# Patient Record
Sex: Male | Born: 1935
Health system: Southern US, Community
[De-identification: ages and names within clinical notes are randomized; demographics above are authoritative.]

## PROBLEM LIST (undated history)

## (undated) ENCOUNTER — Ambulatory Visit: Admission: EM | Payer: Medicare HMO

## (undated) DIAGNOSIS — G8929 Other chronic pain: Secondary | ICD-10-CM

## (undated) DIAGNOSIS — E785 Hyperlipidemia, unspecified: Secondary | ICD-10-CM

## (undated) DIAGNOSIS — I4892 Unspecified atrial flutter: Secondary | ICD-10-CM

## (undated) DIAGNOSIS — I214 Non-ST elevation (NSTEMI) myocardial infarction: Secondary | ICD-10-CM

## (undated) DIAGNOSIS — N2889 Other specified disorders of kidney and ureter: Secondary | ICD-10-CM

## (undated) DIAGNOSIS — I209 Angina pectoris, unspecified: Secondary | ICD-10-CM

## (undated) DIAGNOSIS — M1990 Unspecified osteoarthritis, unspecified site: Secondary | ICD-10-CM

## (undated) DIAGNOSIS — G4733 Obstructive sleep apnea (adult) (pediatric): Secondary | ICD-10-CM

## (undated) DIAGNOSIS — K219 Gastro-esophageal reflux disease without esophagitis: Secondary | ICD-10-CM

## (undated) DIAGNOSIS — M549 Dorsalgia, unspecified: Secondary | ICD-10-CM

## (undated) DIAGNOSIS — N189 Chronic kidney disease, unspecified: Secondary | ICD-10-CM

## (undated) DIAGNOSIS — I2510 Atherosclerotic heart disease of native coronary artery without angina pectoris: Secondary | ICD-10-CM

## (undated) DIAGNOSIS — Z85828 Personal history of other malignant neoplasm of skin: Secondary | ICD-10-CM

## (undated) DIAGNOSIS — N400 Enlarged prostate without lower urinary tract symptoms: Secondary | ICD-10-CM

## (undated) DIAGNOSIS — T7840XA Allergy, unspecified, initial encounter: Secondary | ICD-10-CM

## (undated) DIAGNOSIS — Z9989 Dependence on other enabling machines and devices: Secondary | ICD-10-CM

## (undated) DIAGNOSIS — I10 Essential (primary) hypertension: Secondary | ICD-10-CM

## (undated) DIAGNOSIS — M419 Scoliosis, unspecified: Secondary | ICD-10-CM

## (undated) DIAGNOSIS — M7580 Other shoulder lesions, unspecified shoulder: Secondary | ICD-10-CM

## (undated) HISTORY — DX: Obstructive sleep apnea (adult) (pediatric): G47.33

## (undated) HISTORY — DX: Benign prostatic hyperplasia without lower urinary tract symptoms: N40.0

## (undated) HISTORY — DX: Other chronic pain: G89.29

## (undated) HISTORY — DX: Allergy, unspecified, initial encounter: T78.40XA

## (undated) HISTORY — DX: Essential (primary) hypertension: I10

## (undated) HISTORY — DX: Other shoulder lesions, unspecified shoulder: M75.80

## (undated) HISTORY — DX: Scoliosis, unspecified: M41.9

## (undated) HISTORY — DX: Personal history of other malignant neoplasm of skin: Z85.828

## (undated) HISTORY — DX: Gastro-esophageal reflux disease without esophagitis: K21.9

## (undated) HISTORY — DX: Atherosclerotic heart disease of native coronary artery without angina pectoris: I25.10

## (undated) HISTORY — DX: Hyperlipidemia, unspecified: E78.5

## (undated) HISTORY — DX: Dependence on other enabling machines and devices: Z99.89

## (undated) HISTORY — DX: Dorsalgia, unspecified: M54.9

## (undated) HISTORY — DX: Unspecified osteoarthritis, unspecified site: M19.90

---

## 2001-01-27 ENCOUNTER — Ambulatory Visit (HOSPITAL_BASED_OUTPATIENT_CLINIC_OR_DEPARTMENT_OTHER): Admission: RE | Admit: 2001-01-27 | Discharge: 2001-01-27 | Payer: Self-pay | Admitting: Internal Medicine

## 2004-05-16 ENCOUNTER — Ambulatory Visit: Payer: Self-pay | Admitting: Family Medicine

## 2004-06-02 ENCOUNTER — Ambulatory Visit: Payer: Self-pay | Admitting: Family Medicine

## 2004-07-13 ENCOUNTER — Ambulatory Visit: Payer: Self-pay | Admitting: Family Medicine

## 2004-07-20 ENCOUNTER — Ambulatory Visit: Payer: Self-pay | Admitting: Family Medicine

## 2004-08-31 ENCOUNTER — Ambulatory Visit: Payer: Self-pay | Admitting: Family Medicine

## 2004-11-11 ENCOUNTER — Emergency Department: Payer: Self-pay | Admitting: Emergency Medicine

## 2004-12-06 ENCOUNTER — Ambulatory Visit: Payer: Self-pay | Admitting: Family Medicine

## 2005-01-04 ENCOUNTER — Ambulatory Visit: Payer: Self-pay | Admitting: Family Medicine

## 2005-03-15 ENCOUNTER — Ambulatory Visit: Payer: Self-pay | Admitting: Family Medicine

## 2005-04-02 ENCOUNTER — Ambulatory Visit: Payer: Self-pay | Admitting: Gastroenterology

## 2005-04-06 ENCOUNTER — Ambulatory Visit: Payer: Self-pay | Admitting: Family Medicine

## 2005-04-18 ENCOUNTER — Ambulatory Visit: Payer: Self-pay | Admitting: Gastroenterology

## 2005-04-18 ENCOUNTER — Encounter (INDEPENDENT_AMBULATORY_CARE_PROVIDER_SITE_OTHER): Payer: Self-pay | Admitting: *Deleted

## 2005-05-08 ENCOUNTER — Ambulatory Visit: Payer: Self-pay | Admitting: Family Medicine

## 2005-10-08 ENCOUNTER — Ambulatory Visit: Payer: Self-pay | Admitting: Family Medicine

## 2005-10-08 LAB — CONVERTED CEMR LAB: PSA: 1.35 ng/mL

## 2005-10-10 ENCOUNTER — Ambulatory Visit: Payer: Self-pay | Admitting: Family Medicine

## 2005-10-24 LAB — FECAL OCCULT BLOOD, GUAIAC: Fecal Occult Blood: NEGATIVE

## 2005-10-25 ENCOUNTER — Ambulatory Visit: Payer: Self-pay | Admitting: Family Medicine

## 2006-01-08 ENCOUNTER — Ambulatory Visit: Payer: Self-pay | Admitting: Family Medicine

## 2006-02-07 ENCOUNTER — Ambulatory Visit: Payer: Self-pay | Admitting: Family Medicine

## 2006-03-13 ENCOUNTER — Ambulatory Visit: Payer: Self-pay | Admitting: Family Medicine

## 2006-04-09 ENCOUNTER — Ambulatory Visit: Payer: Self-pay | Admitting: Family Medicine

## 2006-05-07 ENCOUNTER — Ambulatory Visit: Payer: Self-pay | Admitting: Family Medicine

## 2006-08-21 ENCOUNTER — Ambulatory Visit: Payer: Self-pay | Admitting: Family Medicine

## 2006-08-21 LAB — CONVERTED CEMR LAB
ALT: 29 units/L (ref 0–40)
AST: 22 units/L (ref 0–37)
Cholesterol: 196 mg/dL (ref 0–200)
Creatinine,U: 162.6 mg/dL
HDL: 36.3 mg/dL — ABNORMAL LOW (ref >39.0)
Hgb A1c MFr Bld: 6.2 %
Total CHOL/HDL Ratio: 5.4
Triglycerides: 103 mg/dL (ref 0–149)

## 2006-10-04 ENCOUNTER — Encounter: Payer: Self-pay | Admitting: Family Medicine

## 2006-10-04 DIAGNOSIS — F528 Other sexual dysfunction not due to a substance or known physiological condition: Secondary | ICD-10-CM

## 2006-10-04 DIAGNOSIS — R609 Edema, unspecified: Secondary | ICD-10-CM

## 2006-10-04 DIAGNOSIS — IMO0001 Reserved for inherently not codable concepts without codable children: Secondary | ICD-10-CM | POA: Insufficient documentation

## 2006-10-04 DIAGNOSIS — G4730 Sleep apnea, unspecified: Secondary | ICD-10-CM | POA: Insufficient documentation

## 2006-10-04 DIAGNOSIS — Z794 Long term (current) use of insulin: Secondary | ICD-10-CM

## 2006-10-04 DIAGNOSIS — E119 Type 2 diabetes mellitus without complications: Secondary | ICD-10-CM

## 2006-10-04 DIAGNOSIS — Z6835 Body mass index (BMI) 35.0-35.9, adult: Secondary | ICD-10-CM

## 2006-10-04 DIAGNOSIS — M1990 Unspecified osteoarthritis, unspecified site: Secondary | ICD-10-CM | POA: Insufficient documentation

## 2006-10-04 DIAGNOSIS — Z85828 Personal history of other malignant neoplasm of skin: Secondary | ICD-10-CM

## 2006-10-04 DIAGNOSIS — I10 Essential (primary) hypertension: Secondary | ICD-10-CM | POA: Insufficient documentation

## 2006-10-04 DIAGNOSIS — K219 Gastro-esophageal reflux disease without esophagitis: Secondary | ICD-10-CM | POA: Insufficient documentation

## 2006-10-04 DIAGNOSIS — E66812 Obesity, class 2: Secondary | ICD-10-CM | POA: Insufficient documentation

## 2006-10-04 DIAGNOSIS — J309 Allergic rhinitis, unspecified: Secondary | ICD-10-CM

## 2006-11-26 ENCOUNTER — Ambulatory Visit: Payer: Self-pay | Admitting: Family Medicine

## 2006-11-29 LAB — CONVERTED CEMR LAB
Albumin: 3.9 g/dL (ref 3.5–5.2)
GFR calc Af Amer: 85 mL/min
GFR calc non Af Amer: 70 mL/min
Glucose, Bld: 141 mg/dL — ABNORMAL HIGH (ref 70–99)
Phosphorus: 3 mg/dL (ref 2.3–4.6)
Potassium: 4.4 meq/L (ref 3.5–5.1)
Sodium: 145 meq/L (ref 135–145)

## 2007-04-15 ENCOUNTER — Encounter: Payer: Self-pay | Admitting: Family Medicine

## 2007-05-22 ENCOUNTER — Ambulatory Visit: Payer: Self-pay | Admitting: Family Medicine

## 2007-05-23 LAB — CONVERTED CEMR LAB
ALT: 32 units/L (ref 0–53)
AST: 26 units/L (ref 0–37)
Albumin: 3.7 g/dL (ref 3.5–5.2)
Basophils Absolute: 0 10*3/uL (ref 0.0–0.1)
Basophils Relative: 0.3 % (ref 0.0–1.0)
Calcium: 9.2 mg/dL (ref 8.4–10.5)
Chloride: 106 meq/L (ref 96–112)
Creatinine, Ser: 1.1 mg/dL (ref 0.4–1.5)
GFR calc Af Amer: 85 mL/min
GFR calc non Af Amer: 70 mL/min
HCT: 43.2 % (ref 39.0–52.0)
Hemoglobin: 15.2 g/dL (ref 13.0–17.0)
LDL Cholesterol: 137 mg/dL — ABNORMAL HIGH (ref 0–99)
Monocytes Absolute: 0.7 10*3/uL (ref 0.2–0.7)
Neutrophils Relative %: 59.2 % (ref 43.0–77.0)
RBC: 4.62 M/uL (ref 4.22–5.81)
RDW: 13.9 % (ref 11.5–14.6)
Sodium: 141 meq/L (ref 135–145)
TSH: 2.25 microintl units/mL (ref 0.35–5.50)
Total CHOL/HDL Ratio: 5.7
VLDL: 17 mg/dL (ref 0–40)

## 2007-07-03 HISTORY — PX: BREAST SURGERY: SHX581

## 2007-11-21 ENCOUNTER — Ambulatory Visit: Payer: Self-pay | Admitting: Family Medicine

## 2007-11-26 LAB — CONVERTED CEMR LAB
Albumin: 3.8 g/dL (ref 3.5–5.2)
Alkaline Phosphatase: 67 units/L (ref 39–117)
Basophils Absolute: 0 10*3/uL (ref 0.0–0.1)
Basophils Relative: 0.8 % (ref 0.0–1.0)
CO2: 31 meq/L (ref 19–32)
Chloride: 108 meq/L (ref 96–112)
Creatinine, Ser: 1.1 mg/dL (ref 0.4–1.5)
Creatinine,U: 172.4 mg/dL
Eosinophils Absolute: 0.4 10*3/uL (ref 0.0–0.7)
Eosinophils Relative: 6.6 % — ABNORMAL HIGH (ref 0.0–5.0)
GFR calc Af Amer: 85 mL/min
GFR calc non Af Amer: 70 mL/min
HCT: 44.3 % (ref 39.0–52.0)
HDL: 28.7 mg/dL — ABNORMAL LOW (ref >39.0)
Hemoglobin: 15 g/dL (ref 13.0–17.0)
Hgb A1c MFr Bld: 6.8 % — ABNORMAL HIGH (ref 4.6–6.0)
MCHC: 33.8 g/dL (ref 30.0–36.0)
MCV: 93.1 fL (ref 78.0–100.0)
Microalb Creat Ratio: 10.4 mg/g (ref 0.0–30.0)
Monocytes Absolute: 0.6 10*3/uL (ref 0.1–1.0)
Neutro Abs: 3.3 10*3/uL (ref 1.4–7.7)
Potassium: 4.3 meq/L (ref 3.5–5.1)
RBC: 4.76 M/uL (ref 4.22–5.81)
Sodium: 139 meq/L (ref 135–145)
Total Protein: 6.7 g/dL (ref 6.0–8.3)
WBC: 5.9 10*3/uL (ref 4.5–10.5)

## 2007-12-04 ENCOUNTER — Ambulatory Visit: Payer: Self-pay | Admitting: Family Medicine

## 2008-02-26 ENCOUNTER — Ambulatory Visit: Payer: Self-pay | Admitting: Family Medicine

## 2008-02-27 LAB — CONVERTED CEMR LAB
ALT: 32 units/L (ref 0–53)
Albumin: 3.8 g/dL (ref 3.5–5.2)
Chloride: 109 meq/L (ref 96–112)
Cholesterol: 228 mg/dL (ref 0–200)
Creatinine, Ser: 1.1 mg/dL (ref 0.4–1.5)
Direct LDL: 147.1 mg/dL
Hgb A1c MFr Bld: 6.5 % — ABNORMAL HIGH (ref 4.6–6.0)
Sodium: 144 meq/L (ref 135–145)
Total CHOL/HDL Ratio: 6.9
VLDL: 25 mg/dL (ref 0–40)

## 2008-03-10 ENCOUNTER — Ambulatory Visit: Payer: Self-pay | Admitting: Family Medicine

## 2008-03-15 ENCOUNTER — Encounter: Payer: Self-pay | Admitting: Family Medicine

## 2008-09-08 ENCOUNTER — Ambulatory Visit: Payer: Self-pay | Admitting: Family Medicine

## 2008-09-09 LAB — CONVERTED CEMR LAB
ALT: 33 units/L (ref 0–53)
AST: 24 units/L (ref 0–37)
Cholesterol: 184 mg/dL (ref 0–200)
VLDL: 15 mg/dL (ref 0–40)

## 2008-09-16 ENCOUNTER — Ambulatory Visit: Payer: Self-pay | Admitting: Family Medicine

## 2008-09-28 ENCOUNTER — Ambulatory Visit: Payer: Self-pay | Admitting: Family Medicine

## 2008-09-29 ENCOUNTER — Encounter: Payer: Self-pay | Admitting: Family Medicine

## 2008-09-30 ENCOUNTER — Ambulatory Visit: Payer: Self-pay | Admitting: Family Medicine

## 2008-10-15 ENCOUNTER — Telehealth: Payer: Self-pay | Admitting: Family Medicine

## 2008-10-30 LAB — HM DIABETES EYE EXAM: HM Diabetic Eye Exam: NORMAL

## 2008-11-01 ENCOUNTER — Ambulatory Visit: Payer: Self-pay | Admitting: Family Medicine

## 2008-11-09 ENCOUNTER — Telehealth: Payer: Self-pay | Admitting: Family Medicine

## 2008-11-16 ENCOUNTER — Encounter: Payer: Self-pay | Admitting: Family Medicine

## 2008-11-29 ENCOUNTER — Encounter: Payer: Self-pay | Admitting: Family Medicine

## 2008-11-30 ENCOUNTER — Ambulatory Visit: Payer: Self-pay | Admitting: Family Medicine

## 2009-01-18 ENCOUNTER — Telehealth: Payer: Self-pay | Admitting: Family Medicine

## 2009-02-03 ENCOUNTER — Ambulatory Visit: Payer: Self-pay | Admitting: Family Medicine

## 2009-02-04 ENCOUNTER — Telehealth: Payer: Self-pay | Admitting: Family Medicine

## 2009-02-04 ENCOUNTER — Ambulatory Visit: Payer: Self-pay | Admitting: Family Medicine

## 2009-02-06 LAB — CONVERTED CEMR LAB
Glucose, Bld: 148 mg/dL — ABNORMAL HIGH (ref 70–99)
Hgb A1c MFr Bld: 6.1 % (ref 4.6–6.5)

## 2009-02-07 ENCOUNTER — Encounter: Payer: Self-pay | Admitting: Family Medicine

## 2009-02-09 ENCOUNTER — Encounter: Payer: Self-pay | Admitting: Family Medicine

## 2009-05-17 ENCOUNTER — Encounter: Admission: RE | Admit: 2009-05-17 | Discharge: 2009-05-17 | Payer: Self-pay | Admitting: Family Medicine

## 2009-05-17 ENCOUNTER — Ambulatory Visit: Payer: Self-pay | Admitting: Family Medicine

## 2009-05-17 IMAGING — CR DG LUMBAR SPINE COMPLETE 4+V
5 series · 5 of 5 positions shown · non-contrast
Comparison: None

CLINICAL DATA: Low back pain for 5 weeks.  No known injury.

LUMBAR SPINE - COMPLETE 4+ VIEW

[view not recorded (1 of 5)]
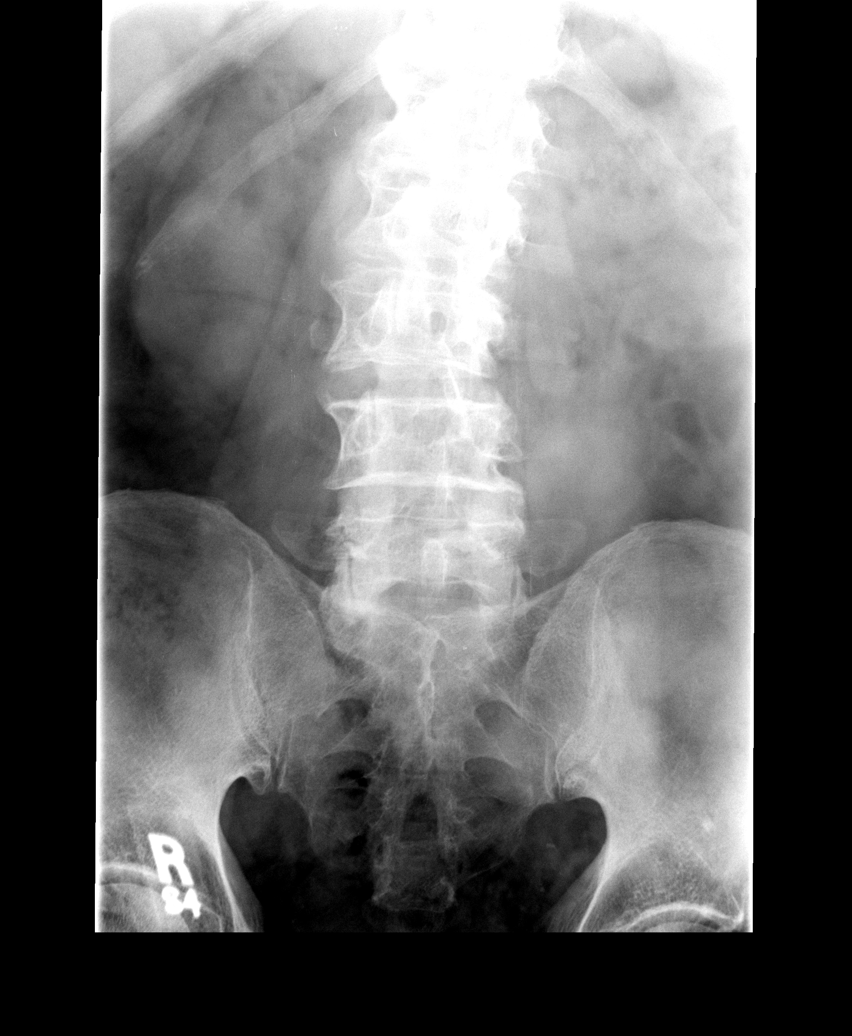

[view not recorded (2 of 5)]
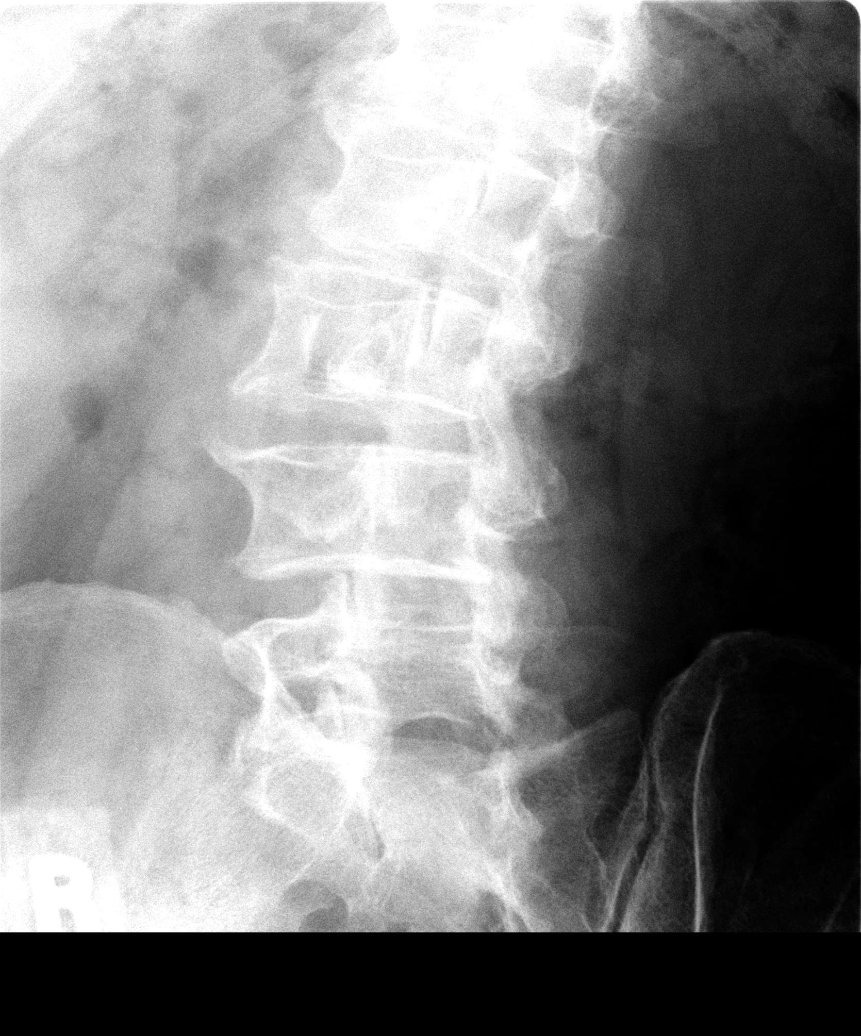

[view not recorded (3 of 5)]
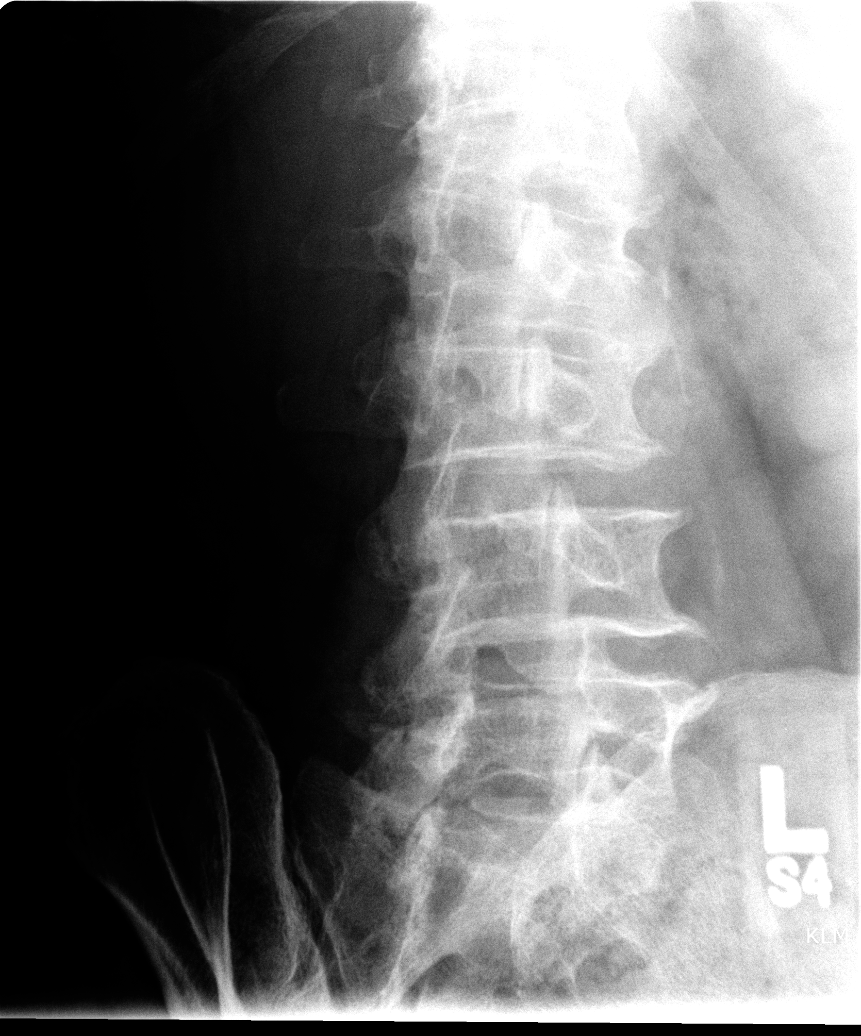

[view not recorded (4 of 5)]
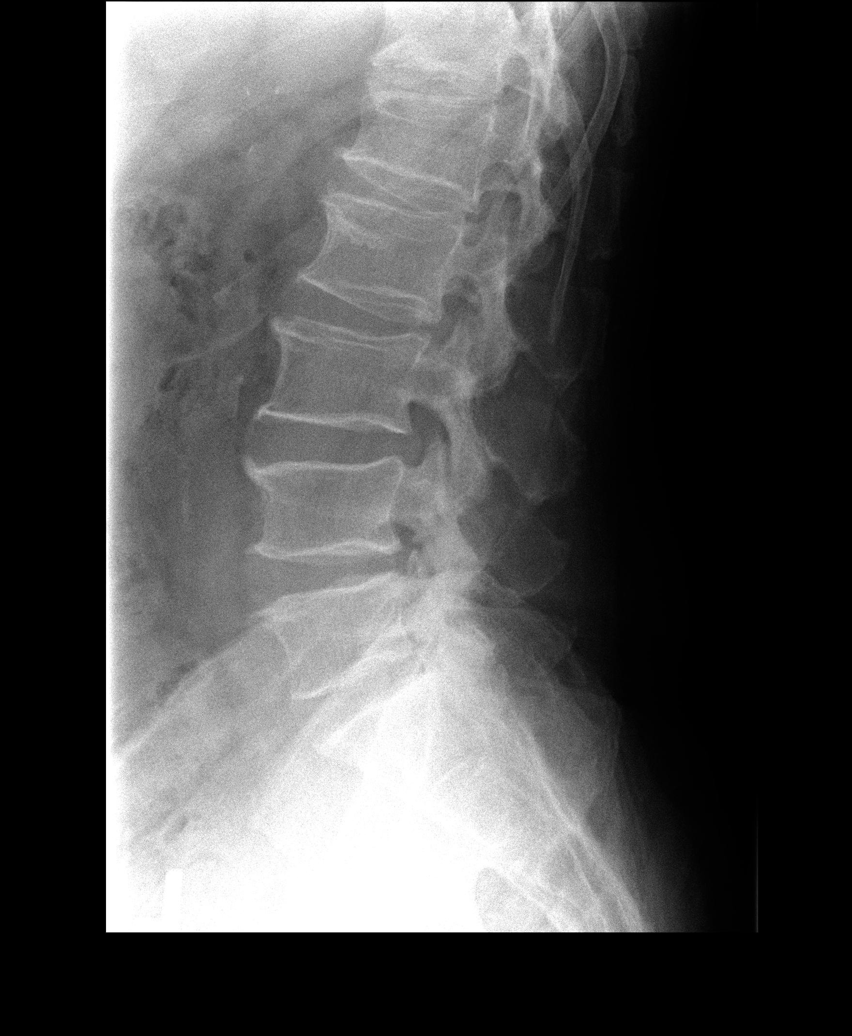

[view not recorded (5 of 5)]
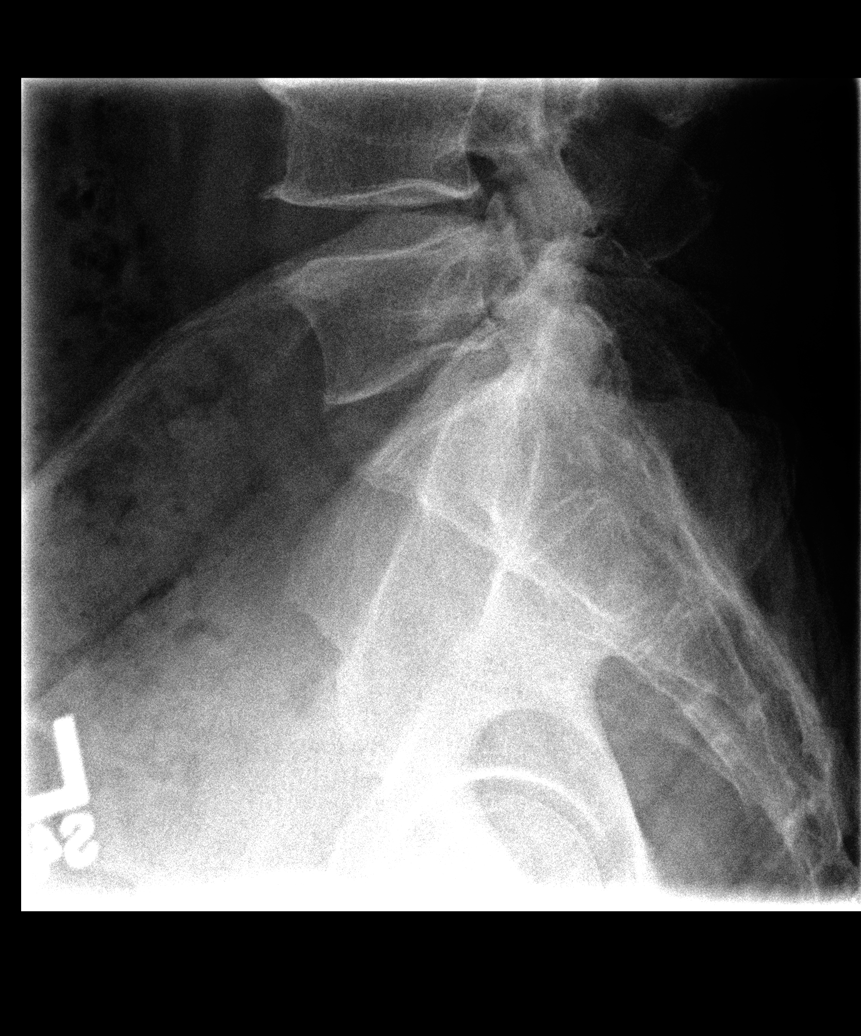

[5 of 5 positions shown; findings below may reference images not displayed]

FINDINGS: There are five lumbar type vertebral bodies.  There is a
mild dextroscoliosis centered at L2-L3.  The lateral alignment is
normal.  Mild anterior wedging of the L1 vertebral body does not
appear acute.  There is no evidence of acute fracture.  Mild facet
degenerative changes are present inferiorly.  There are small
paraspinal osteophytes and mild bilateral facet hypertrophy.
IMPRESSION: Mild spondylosis and convex right scoliosis.  No acute osseous
findings.

## 2009-05-31 ENCOUNTER — Encounter: Payer: Self-pay | Admitting: Family Medicine

## 2009-07-05 ENCOUNTER — Encounter: Payer: Self-pay | Admitting: Family Medicine

## 2009-08-08 ENCOUNTER — Encounter (INDEPENDENT_AMBULATORY_CARE_PROVIDER_SITE_OTHER): Payer: Self-pay | Admitting: *Deleted

## 2009-08-09 ENCOUNTER — Ambulatory Visit: Payer: Self-pay | Admitting: Family Medicine

## 2009-08-09 LAB — CONVERTED CEMR LAB
AST: 21 units/L (ref 0–37)
Chloride: 106 meq/L (ref 96–112)
Cholesterol: 202 mg/dL — ABNORMAL HIGH (ref 0–200)
GFR calc non Af Amer: 69.65 mL/min (ref >60)
Glucose, Bld: 170 mg/dL — ABNORMAL HIGH (ref 70–99)
Hgb A1c MFr Bld: 6.8 % — ABNORMAL HIGH (ref 4.6–6.5)
PSA: 2.13 ng/mL (ref 0.10–4.00)
Phosphorus: 3.8 mg/dL (ref 2.3–4.6)
Potassium: 4.3 meq/L (ref 3.5–5.1)
Sodium: 144 meq/L (ref 135–145)

## 2009-08-10 ENCOUNTER — Encounter: Payer: Self-pay | Admitting: Critical Care Medicine

## 2009-08-11 ENCOUNTER — Ambulatory Visit: Payer: Self-pay | Admitting: Family Medicine

## 2009-10-07 ENCOUNTER — Telehealth: Payer: Self-pay | Admitting: Family Medicine

## 2009-11-04 ENCOUNTER — Ambulatory Visit: Payer: Self-pay | Admitting: Family Medicine

## 2009-11-07 LAB — CONVERTED CEMR LAB
AST: 21 units/L (ref 0–37)
Albumin: 4 g/dL (ref 3.5–5.2)
BUN: 18 mg/dL (ref 6–23)
Cholesterol: 200 mg/dL (ref 0–200)
Creatinine, Ser: 1 mg/dL (ref 0.4–1.5)
Hgb A1c MFr Bld: 6.6 % — ABNORMAL HIGH (ref 4.6–6.5)
LDL Cholesterol: 140 mg/dL — ABNORMAL HIGH (ref 0–99)
Potassium: 4.1 meq/L (ref 3.5–5.1)
Sodium: 139 meq/L (ref 135–145)
Total CHOL/HDL Ratio: 6
Triglycerides: 125 mg/dL (ref 0.0–149.0)

## 2009-11-08 ENCOUNTER — Ambulatory Visit: Payer: Self-pay | Admitting: Family Medicine

## 2009-12-01 ENCOUNTER — Telehealth: Payer: Self-pay | Admitting: Family Medicine

## 2010-01-13 ENCOUNTER — Ambulatory Visit: Payer: Self-pay | Admitting: Family Medicine

## 2010-01-13 DIAGNOSIS — M545 Low back pain: Secondary | ICD-10-CM

## 2010-02-17 ENCOUNTER — Telehealth: Payer: Self-pay | Admitting: Family Medicine

## 2010-02-24 ENCOUNTER — Telehealth: Payer: Self-pay | Admitting: Family Medicine

## 2010-02-25 ENCOUNTER — Encounter: Payer: Self-pay | Admitting: Family Medicine

## 2010-02-27 ENCOUNTER — Encounter: Payer: Self-pay | Admitting: Family Medicine

## 2010-05-09 ENCOUNTER — Ambulatory Visit: Payer: Self-pay | Admitting: Family Medicine

## 2010-05-10 LAB — CONVERTED CEMR LAB
AST: 17 units/L (ref 0–37)
Basophils Relative: 0.4 % (ref 0.0–3.0)
CO2: 28 meq/L (ref 19–32)
Calcium: 9.3 mg/dL (ref 8.4–10.5)
Chloride: 106 meq/L (ref 96–112)
Cholesterol: 211 mg/dL — ABNORMAL HIGH (ref 0–200)
Creatinine, Ser: 1.1 mg/dL (ref 0.4–1.5)
Creatinine,U: 162.9 mg/dL
Direct LDL: 156.5 mg/dL
Eosinophils Relative: 3.7 % (ref 0.0–5.0)
GFR calc non Af Amer: 68.79 mL/min (ref >60)
HCT: 42.3 % (ref 39.0–52.0)
HDL: 36.4 mg/dL — ABNORMAL LOW (ref >39.00)
Hemoglobin: 14.3 g/dL (ref 13.0–17.0)
Lymphs Abs: 1.7 10*3/uL (ref 0.7–4.0)
MCV: 94.8 fL (ref 78.0–100.0)
Microalb Creat Ratio: 2 mg/g (ref 0.0–30.0)
Monocytes Absolute: 0.8 10*3/uL (ref 0.1–1.0)
Monocytes Relative: 11.4 % (ref 3.0–12.0)
Neutro Abs: 4.1 10*3/uL (ref 1.4–7.7)
Sodium: 142 meq/L (ref 135–145)
VLDL: 32.6 mg/dL (ref 0.0–40.0)
WBC: 6.9 10*3/uL (ref 4.5–10.5)

## 2010-05-15 ENCOUNTER — Ambulatory Visit: Payer: Self-pay | Admitting: Family Medicine

## 2010-05-15 DIAGNOSIS — R32 Unspecified urinary incontinence: Secondary | ICD-10-CM | POA: Insufficient documentation

## 2010-05-30 ENCOUNTER — Encounter: Payer: Self-pay | Admitting: Gastroenterology

## 2010-05-31 ENCOUNTER — Encounter: Payer: Self-pay | Admitting: Family Medicine

## 2010-06-23 ENCOUNTER — Ambulatory Visit: Payer: Self-pay | Admitting: Family Medicine

## 2010-07-05 ENCOUNTER — Encounter (INDEPENDENT_AMBULATORY_CARE_PROVIDER_SITE_OTHER): Payer: Self-pay | Admitting: *Deleted

## 2010-07-12 ENCOUNTER — Encounter: Payer: Self-pay | Admitting: Family Medicine

## 2010-07-18 ENCOUNTER — Encounter (INDEPENDENT_AMBULATORY_CARE_PROVIDER_SITE_OTHER): Payer: Self-pay | Admitting: *Deleted

## 2010-07-20 ENCOUNTER — Ambulatory Visit
Admission: RE | Admit: 2010-07-20 | Discharge: 2010-07-20 | Payer: Self-pay | Source: Home / Self Care | Attending: Gastroenterology | Admitting: Gastroenterology

## 2010-08-03 NOTE — Letter (Signed)
Summary: Martin No Show Letter  [place] [facility]  [address] [place], [place] [address]   Phone: [phone]  Fax: [phone]    08/08/2009 MRN: [id number]  ELIESER TETRICK [address] [place], [place]  [address]   Dear Mr. KAPUR,   Our records indicate that you missed your scheduled appointment with ___lab__________________ on __2.7.11__________.  Please contact this office to reschedule your appointment as soon as possible.  It is important that you keep your scheduled appointments with your physician, so we can provide you the best care possible.  Please be advised that there may be a charge for "no show" appointments.    Sincerely,   [place] at [facility]

## 2010-08-03 NOTE — Miscellaneous (Signed)
Summary: PT Discharge/[facility]  PT Discharge/[facility]   Imported By: Lanelle Bal 07/06/2009 09:53:34  _____________________________________________________________________  External Attachment:    Type:   Image     Comment:   External Document

## 2010-08-03 NOTE — Letter (Signed)
Summary: [facility] Instructions  [facility]  [address] [place], [place] [address]   Phone: [phone]  Fax: [phone]       DAYMIEN GOTH    1933-12-24    MRN: [id number]        Procedure Day /Date:  Tuesday 08/08/2010     Arrival Time: 7:30 am     Procedure Time: 8:30 am     Location of Procedure:                    _x _  [facility] (4th Floor)                        PREPARATION FOR COLONOSCOPY WITH MOVIPREP   Starting 5 days prior to your procedure Thursday 2/2 do not eat nuts, seeds, popcorn, corn, beans, peas,  salads, or any raw vegetables.  Do not take any fiber supplements (e.g. Metamucil, Citrucel, and Benefiber).  THE DAY BEFORE YOUR PROCEDURE         DATE: Monday 2/6  1.  Drink clear liquids the entire day-NO SOLID FOOD  2.  Do not drink anything colored red or purple.  Avoid juices with pulp.  No orange juice.  3.  Drink at least 64 oz. (8 glasses) of fluid/clear liquids during the day to prevent dehydration and help the prep work efficiently.  CLEAR LIQUIDS INCLUDE: Water Jello Ice Popsicles Tea (sugar ok, no milk/cream) Powdered fruit flavored drinks Coffee (sugar ok, no milk/cream) Gatorade Juice: apple, white grape, white cranberry  Lemonade Clear bullion, consomm, broth Carbonated beverages (any kind) Strained chicken noodle soup Hard Candy                             4.  In the morning, mix first dose of MoviPrep solution:    Empty 1 Pouch A and 1 Pouch B into the disposable container    Add lukewarm drinking water to the top line of the container. Mix to dissolve    Refrigerate (mixed solution should be used within 24 hrs)  5.  Begin drinking the prep at 5:00 p.m. The MoviPrep container is divided by 4 marks.   Every 15 minutes drink the solution down to the next mark (approximately 8 oz) until the full liter is complete.   6.  Follow completed prep with 16 oz of clear liquid of your choice (Nothing red or  purple).  Continue to drink clear liquids until bedtime.  7.  Before going to bed, mix second dose of MoviPrep solution:    Empty 1 Pouch A and 1 Pouch B into the disposable container    Add lukewarm drinking water to the top line of the container. Mix to dissolve    Refrigerate  THE DAY OF YOUR PROCEDURE      DATE: Tuesday 2/7  Beginning at 3:30 a.m. (5 hours before procedure):         1. Every 15 minutes, drink the solution down to the next mark (approx 8 oz) until the full liter is complete.  2. Follow completed prep with 16 oz. of clear liquid of your choice.    3. You may drink clear liquids until 6:30 am (2 HOURS BEFORE PROCEDURE).   MEDICATION INSTRUCTIONS  Unless otherwise instructed, you should take regular prescription medications with a small sip of water   as early as possible the morning of your  procedure.  Diabetic patients - see separate instructions.  Additional medication instructions: DO NOT TAKE HYDROCHLOROTHIAZIDE ON MORNING OF PROCEDURE.         OTHER INSTRUCTIONS  You will need a responsible adult at least 75 years of age to accompany you and drive you home.   This person must remain in the waiting room during your procedure.  Wear loose fitting clothing that is easily removed.  Leave jewelry and other valuables at home.  However, you may wish to bring a book to read or  an iPod/MP3 player to listen to music as you wait for your procedure to start.  Remove all body piercing jewelry and leave at home.  Total time from sign-in until discharge is approximately 2-3 hours.  You should go home directly after your procedure and rest.  You can resume normal activities the  day after your procedure.  The day of your procedure you should not:   Drive   Make legal decisions   Operate machinery   Drink alcohol   Return to work  You will receive specific instructions about eating, activities and medications before you leave.    The above  instructions have been reviewed and explained to me by   Durwin Glaze RN  July 20, 2010 10:35 AM     I fully understand and can verbalize these instructions _____________________________ Date _________

## 2010-08-03 NOTE — Assessment & Plan Note (Signed)
Summary: ROA 3 MTHS CYD   Vital Signs:  Patient profile:   77 year old male Height:      71 inches Weight:      257.50 pounds BMI:     36.04 Temp:     98.1 degrees F oral Pulse rate:   60 / minute Pulse rhythm:   regular BP sitting:   138 / 66  (left arm) Cuff size:   large  Vitals Entered By: Lewanda Rife LPN (Nov 08, 2009 9:18 AM)  Serial Vital Signs/Assessments:  Time      Position  BP       Pulse  Resp  Temp     By                     128/69                         Judith Part MD  CC: follow up after labs   CC:  follow up after labs.  History of Present Illness: here for f/u of DM and HTN and obesity and lipids is doing fine- feeling ok   wt is down 2 lb with bmi of 36 is staying busy outdoors in warmer weather   DM is a little better -- AIC is down from 6.8 to 6.6 this time with fasting sugar of 161 home sugar  (they got high several weeks ago for no reason) - then they came back down changed his eating a bit and got more exercise  no stress or infection  got out his elliptical machine  130-150s in am  in am and 160s after a meal  opthy -- overdue for that -and wants to schedule  his vision is fine     lipids are slt imp with trig 125/ HDL 35 and LDL 140 (down from 152) he refuses med of any kind after intol of zocor   bp is better with ace today 138/66 - first check   Allergies: 1)  ! Zocor 2)  Claritin  Past History:  Past Medical History: Last updated: 10/04/2006 Allergic rhinitis Skin cancer, hx of- basal cell Diabetes mellitus, type II GERD Hypertension Osteoarthritis  Past Surgical History: Last updated: 10/04/2006 Left ankle spurs (04/1998) Breast lump removed- benign (1997-1998) CAD- Cath, mild ? (1995) Colonoscopy- polyps, diverticulosis (04/2002)  Social History: Last updated: 11/21/2007 works at home non smoker  Risk Factors: Smoking Status: never (10/04/2006)  Review of Systems General:  Denies fatigue, fever, loss  of appetite, and malaise. Eyes:  Denies blurring and eye irritation. CV:  Denies chest pain or discomfort and palpitations. Resp:  Denies cough, shortness of breath, sputum productive, and wheezing. GI:  Denies abdominal pain, indigestion, and nausea. GU:  Denies urinary frequency. MS:  Complains of joint pain and stiffness; denies muscle aches and cramps. Derm:  Denies itching, lesion(s), poor wound healing, and rash. Neuro:  Denies numbness and tingling. Psych:  Denies anxiety and depression. Endo:  Denies excessive thirst and excessive urination. Heme:  Denies abnormal bruising and bleeding.  Physical Exam  General:  Well-developed,well-nourished,in no acute distress; alert,appropriate and cooperative throughout examination Head:  normocephalic, atraumatic, and no abnormalities observed.  no sinus tenderness  Eyes:  vision grossly intact, pupils equal, pupils round, and pupils reactive to light.   Ears:  R ear normal and L ear normal.   Nose:  nares boggy and pale Mouth:  pharynx pink and moist.  Neck:  nl rom with no bony tenderness  Lungs:  Normal respiratory effort, chest expands symmetrically. Lungs are clear to auscultation, no crackles or wheezes. Heart:  Normal rate and regular rhythm. S1 and S2 normal without gallop, murmur, click, rub or other extra sounds. Abdomen:  soft and non-tender.  no renal bruits    Msk:  poor rom knees improved rom LS  no acute joint changes  Pulses:  R and L carotid,radial,femoral,dorsalis pedis and posterior tibial pulses are full and equal bilaterally Extremities:  No clubbing, cyanosis, edema, or deformity noted with normal full range of motion of all joints.   Neurologic:  strength normal in all extremities, sensation intact to light touch, gait normal, and DTRs symmetrical and normal.   Skin:  Intact without suspicious lesions or rashes Cervical Nodes:  No lymphadenopathy noted Psych:  normal affect, talkative and pleasant   Diabetes  Management Exam:    Foot Exam (with socks and/or shoes not present):       Sensory-Pinprick/Light touch:          Left medial foot (L-4): normal          Left dorsal foot (L-5): normal          Left lateral foot (S-1): normal          Right medial foot (L-4): normal          Right dorsal foot (L-5): normal          Right lateral foot (S-1): normal       Sensory-Monofilament:          Left foot: normal          Right foot: normal       Inspection:          Left foot: normal          Right foot: normal       Nails:          Left foot: normal          Right foot: normal   Impression & Recommendations:  Problem # 1:  HYPERCHOLESTEROLEMIA (ICD-272.0) Assessment Improved  cholesterol is improved a bit with lifestyle change still not at goal pt ref any med for this and understands risks  disc low sat fat diet  re check 6 mo and f/u  Labs Reviewed: SGOT: 21 (11/04/2009)   SGPT: 26 (11/04/2009)   HDL:35.50 (11/04/2009), 39.00 (08/09/2009)  LDL:140 (11/04/2009), 136 (09/08/2008)  Chol:200 (11/04/2009), 202 (08/09/2009)  Trig:125.0 (11/04/2009), 121.0 (08/09/2009)  Problem # 2:  HYPERTENSION (ICD-401.9) Assessment: Improved  bp better with ace- doing well enc exercise lab and f/u 6 mo  rev lab today in detail  His updated medication list for this problem includes:    Hydrochlorothiazide 25 Mg Tabs (Hydrochlorothiazide) ..... One by mouth once daily    Lisinopril 5 Mg Tabs (Lisinopril) .Marland Kitchen... 1 by mouth once daily  BP today: 138/66-- re check 128/69 Prior BP: 145/60 (08/11/2009)  Labs Reviewed: K+: 4.1 (11/04/2009) Creat: : 1.0 (11/04/2009)   Chol: 200 (11/04/2009)   HDL: 35.50 (11/04/2009)   LDL: 140 (11/04/2009)   TG: 125.0 (11/04/2009)  Problem # 3:  DIABETES MELLITUS, TYPE II (ICD-250.00) Assessment: Improved  sugar control is a bit better - with addn of regular exercise disc healthy diet (low simple sugar/ choose complex carbs/ low sat fat) diet and exercise in detail   no change in med lab and f/u 6 mo  lab rev in detail today His  updated medication list for this problem includes:    Metformin Hcl 1000 Mg Tabs (Metformin hcl) ..... One by mouth two times a day    Adult Aspirin Low Strength 81 Mg Tbdp (Aspirin) ..... One by mouth daily    Glipizide Xl 5 Mg Xr24h-tab (Glipizide) .Marland Kitchen... 1 by mouth once daily    Lisinopril 5 Mg Tabs (Lisinopril) .Marland Kitchen... 1 by mouth once daily  Labs Reviewed: Creat: 1.0 (11/04/2009)     Last Eye Exam: normal (10/30/2008) Reviewed HgBA1c results: 6.6 (11/04/2009)  6.8 (08/09/2009)  Problem # 4:  ALLERGIC RHINITIS (ICD-477.9) Assessment: Deteriorated  ins no longer pays for beconase will try flonase again claritin no longer works- trial of zyrtec  The following medications were removed from the medication list:    Beconase Aq 42 Mcg/spray Susp (Beclomethasone diprop monohyd) .Marland Kitchen... 1 spray in each nostril once daily    Claritin 10 Mg Tabs (Loratadine) .Marland Kitchen... 1 by mouth once daily His updated medication list for this problem includes:    Flonase 50 Mcg/act Susp (Fluticasone propionate) .Marland Kitchen... 2 sprays in each nostril once daily    Zyrtec Allergy 10 Mg Tabs (Cetirizine hcl) .Marland Kitchen... 1 by mouth once daily  Orders: Prescription Created Electronically ([id number])  Complete Medication List: 1)  Hydrochlorothiazide 25 Mg Tabs (Hydrochlorothiazide) .... One by mouth once daily 2)  Metformin Hcl 1000 Mg Tabs (Metformin hcl) .... One by mouth two times a day 3)  Adult Aspirin Low Strength 81 Mg Tbdp (Aspirin) .... One by mouth daily 4)  Freestyle Lite Test Strp (Glucose blood) .... To check glucose twice  daily and as needed  250.0 5)  Glucometer  .... To check glucose once daily and as needed  250.0 6)  Glipizide Xl 5 Mg Xr24h-tab (Glipizide) .Marland Kitchen.. 1 by mouth once daily 7)  Freestyle Lancets Misc (Lancets) .... To check glucose twice  daily and as needed  250.0 8)  Levitra 20 Mg Tabs (Vardenafil hcl) .Marland Kitchen.. 1 by mouth as directed 30 min  before sexual activity 9)  Flax Oil (Flaxseed (linseed)) .... Take one daily 10)  Fish Oil Oil (Fish oil) .... Take two daily 11)  Lisinopril 5 Mg Tabs (Lisinopril) .Marland Kitchen.. 1 by mouth once daily 12)  Flonase 50 Mcg/act Susp (Fluticasone propionate) .... 2 sprays in each nostril once daily 13)  Zyrtec Allergy 10 Mg Tabs (Cetirizine hcl) .Marland Kitchen.. 1 by mouth once daily  Patient Instructions: 1)  try flonase again- I will send to pharmacy  2)  I will also px zyrtec  3)  keep working on healthy diet and exercise and weight loss  4)  schedule fasting labs in 6 months lipid/ast/alt/AIC Judeth Porch / microalb/ cbc with diff/ tsh 401.1, 250.0, 272  5)  then follow up  6)  make sure to make your eye doctor appt - this is a yearly requirement for diabetics  Prescriptions: ZYRTEC ALLERGY 10 MG TABS (CETIRIZINE HCL) 1 by mouth once daily  #90 x 3   Entered and Authorized by:   Judith Part MD   Signed by:   Judith Part MD on 11/08/2009   Method used:   Electronically to        [facility] [address]* (retail)       [address]       [place], [place]  [address]       Ph: [phone]       Fax: [phone]   RxID:   [id number] FLONASE 50 MCG/ACT SUSP (  FLUTICASONE PROPIONATE) 2 sprays in each nostril once daily  #3 months x 3   Entered and Authorized by:   Judith Part MD   Signed by:   Judith Part MD on 11/08/2009   Method used:   Electronically to        [facility] [address] (retail)       [address]       [place], [place]  [address]       Ph: [phone]       Fax: [phone]   RxID:   [id number]   Current Allergies (reviewed today): ! ZOCOR CLARITIN

## 2010-08-03 NOTE — Letter (Signed)
Summary: Pre Visit Letter Revised  [facility]  [address] [place], [place] [address]   Phone: [phone]  Fax: [phone]        07/05/2010 MRN: [id number] Ryan Garcia [address] [place], [place]  [address]             Procedure Date:  08-08-10   Welcome to the Gastroenterology Division at [facility].    You are scheduled to see a nurse for your pre-procedure visit on 07-20-10 at 10:00a.m. on the 3rd floor at [facility], [address]  We ask that you try to arrive at our office 15 minutes prior to your appointment time to allow for check-in.  Please take a minute to review the attached form.  If you answer "Yes" to one or more of the questions on the first page, we ask that you call the person listed at your earliest opportunity.  If you answer "No" to all of the questions, please complete the rest of the form and bring it to your appointment.    Your nurse visit will consist of discussing your medical and surgical history, your immediate family medical history, and your medications.   If you are unable to list all of your medications on the form, please bring the medication bottles to your appointment and we will list them.  We will need to be aware of both prescribed and over the counter drugs.  We will need to know exact dosage information as well.    Please be prepared to read and sign documents such as consent forms, a financial agreement, and acknowledgement forms.  If necessary, and with your consent, a friend or relative is welcome to sit-in on the nurse visit with you.  Please bring your insurance card so that we may make a copy of it.  If your insurance requires a referral to see a specialist, please bring your referral form from your primary care physician.  No co-pay is required for this nurse visit.     If you cannot keep your appointment, please call [phone] to cancel or reschedule prior to your appointment date.  This allows [place] the  opportunity to schedule an appointment for another patient in need of care.    Thank you for choosing [facility] for your medical needs.  We appreciate the opportunity to care for you.  Please visit [place] at our website  to learn more about our practice.  Sincerely, The Gastroenterology Division

## 2010-08-03 NOTE — Progress Notes (Signed)
Summary: Prior Authorization Beconase AQ  Phone Note From Pharmacy Call back at ph [phone] fax [phone]   Caller: [address] [address] [phone]* Call For: Dr. Milinda Antis  Summary of Call: Received fax from pharmacy stating that PA is needed for Beconase AQ.  Faxed all information to [phone] so they will send clinical information to [place].  Linde Gillis CMA Duncan Dull)  October 07, 2009 2:47 PM   Follow-up for Phone Call        Prior auth form for beconase is on your shelf.  I looked it over- ask pt please if he has been intolerant to or had failure of flonase/ flunisolide or fluticasone nasal if not - they will not pay for his beconase and he will need to switch I will hold form on my dest -- [place]  Follow-up by: Lowella Petties CMA,  October 10, 2009 8:38 AM  Additional Follow-up for Phone Call Additional follow up Details #1::        Spoke with pt's wife. Pt had tried Flonase and it caused nosebleeds. Pt doesn't think ever tried Fluticasone. Pt did find some Beconase at home he did not realize he had so pt's wife said he does not need med right now.Lewanda Rife LPN  October 10, 2009 3:52 PM   ok - will note rxn to flonase on all list and shred prior auth for now  can send for another one when he needs it  Additional Follow-up by: Judith Part MD,  October 10, 2009 3:57 PM   New Allergies: ! * FLONASE New Allergies: ! Aleda Grana

## 2010-08-03 NOTE — Progress Notes (Signed)
Summary: prior Berkley Harvey is needed for beconase  Phone Note From Pharmacy   Caller: [facility] Summary of Call: Prior Berkley Harvey is needed for beconase, form is on your shelf. Initial call taken by: Lowella Petties CMA,  February 24, 2010 11:38 AM  Follow-up for Phone Call        let him know I doubt beconase will be covered- let me know how the flonase worked  i will hold the form Follow-up by: Judith Part MD,  February 24, 2010 1:14 PM  Additional Follow-up for Phone Call Additional follow up Details #1::        Left message for patient to call back. Lewanda Rife LPN  February 24, 2010 3:14 PM   Pt states flonase didnt work- nothing has worked for him but the [employer].  form done and in nurse in box  Additional Follow-up by: Lowella Petties CMA,  February 24, 2010 4:13 PM    Additional Follow-up for Phone Call Additional follow up Details #2::    completed form faxed to [phone] as instructed. completed form given to [place].Lewanda Rife LPN  February 27, 2010 8:15 AM    Appended Document: prior Berkley Harvey is needed for beconase Prior auth given for beconase, approval letter is on doctor's desk for signature and scanning.

## 2010-08-03 NOTE — Progress Notes (Signed)
Summary: form for diabetic supplies  Phone Note From Pharmacy   Caller: [facility] Summary of Call: Form for diabetic supplies is on your shelf. Initial call taken by: Lowella Petties CMA,  December 01, 2009 11:22 AM  Follow-up for Phone Call        form done and in nurse in box  let pt know I decreased frequency of testing from two times a day to Qd since he improved test some days in am and some days in afternoon   Follow-up by: Judith Part MD,  December 01, 2009 12:35 PM  Additional Follow-up for Phone Call Additional follow up Details #1::        Patient notified as instructed by telephone. completed form faxed to [phone]. form is at Rena's desk if needed later.Lewanda Rife LPN  December 01, 3084 12:55 PM

## 2010-08-03 NOTE — Assessment & Plan Note (Signed)
Summary: 1 MONTH FOLLOW/RBH   Vital Signs:  Patient profile:   77 year old male Height:      71 inches Weight:      253.75 pounds BMI:     35.52 Temp:     98.5 degrees F oral Pulse rate:   68 / minute Pulse rhythm:   regular BP sitting:   134 / 60  (left arm) Cuff size:   large  Vitals Entered By: Lewanda Rife LPN (June 23, 2010 8:43 AM) CC: 1 month f/u   History of Present Illness: here for f/u of GERD symptoms from last visit   we is down 4 lb which is good  bmi is 35 is eating a lot out lately -- a lot of events   HTN stable with 134/60   GERD zantac -- has totally taken indigestion -- nothing at all  was taking 2 per day   is on jayln from his urologist and he is afraid of side eff cannot donate blood with it  has f/u in early jan      Allergies: 1)  ! Zocor 2)  Claritin  Past History:  Past Medical History: Last updated: 05/15/2010 Allergic rhinitis Skin cancer, hx of- basal cell many skin cancers removed -- under constant treatment Diabetes mellitus, type II GERD Hypertension Osteoarthritis  Past Surgical History: Last updated: 10/04/2006 Left ankle spurs (04/1998) Breast lump removed- benign (1997-1998) CAD- Cath, mild ? (1995) Colonoscopy- polyps, diverticulosis (04/2002)  Social History: Last updated: 11/21/2007 works at home non smoker  Risk Factors: Smoking Status: never (10/04/2006)  Review of Systems General:  Denies fatigue, loss of appetite, and malaise. Eyes:  Denies blurring and eye irritation. CV:  Denies chest pain or discomfort, palpitations, and shortness of breath with exertion. Resp:  Denies cough, shortness of breath, and wheezing. GI:  Denies abdominal pain, change in bowel habits, loss of appetite, and nausea. GU:  Denies hematuria and urinary frequency. MS:  Denies joint pain, joint redness, and joint swelling. Derm:  Denies itching, lesion(s), poor wound healing, and rash. Neuro:  Denies numbness and  tingling. Psych:  Denies anxiety and depression. Endo:  Denies cold intolerance, excessive thirst, excessive urination, and heat intolerance. Heme:  Denies abnormal bruising and bleeding.  Physical Exam  General:  overweight but generally well appearing  Head:  normocephalic, atraumatic, and no abnormalities observed.   Eyes:  vision grossly intact, pupils equal, pupils round, and pupils reactive to light.  no conjunctival pallor, injection or icterus  Mouth:  pharynx pink and moist.   Neck:  supple with full rom and no masses or thyromegally, no JVD or carotid bruit  Lungs:  Normal respiratory effort, chest expands symmetrically. Lungs are clear to auscultation, no crackles or wheezes. Heart:  Normal rate and regular rhythm. S1 and S2 normal without gallop, murmur, click, rub or other extra sounds. Abdomen:  Bowel sounds positive,abdomen soft and non-tender without masses, organomegaly or hernias noted. no renal bruits  obese abd Extremities:  no cce Neurologic:  sensation intact to light touch and DTRs symmetrical and normal.   Skin:  Intact without suspicious lesions or rashes Cervical Nodes:  No lymphadenopathy noted Inguinal Nodes:  No significant adenopathy Psych:  pt is stoic in general  eye contact is fair    Impression & Recommendations:  Problem # 1:  GERD (ICD-530.81) Assessment Improved the symptoms are pretty much resolved with zantac two times a day  will change to as needed  explained if frequent  symptoms will go back to two times a day  disc risk of esoph ca if untx reflux disc the imp of wt loss  His updated medication list for this problem includes:    Zantac 150 Mg Tabs (Ranitidine hcl) .Marland Kitchen... 1 by mouth two times a day  Problem # 2:  DIABETES MELLITUS, TYPE II (ICD-250.00) Assessment: Unchanged  with better diet sugar readings are going down  exp imp AIC and commended on this will keep working on wt loss  lab and f/u in feb  His updated medication list  for this problem includes:    Metformin Hcl 1000 Mg Tabs (Metformin hcl) ..... One by mouth two times a day    Adult Aspirin Low Strength 81 Mg Tbdp (Aspirin) ..... One by mouth daily    Glipizide Xl 5 Mg Xr24h-tab (Glipizide) .Marland Kitchen... 1 by mouth once daily    Lisinopril 5 Mg Tabs (Lisinopril) .Marland Kitchen... 1 by mouth once daily  Labs Reviewed: Creat: 1.1 (05/09/2010)     Last Eye Exam: normal (10/30/2008) Reviewed HgBA1c results: 7.0 (05/09/2010)  6.6 (11/04/2009)  Complete Medication List: 1)  Hydrochlorothiazide 25 Mg Tabs (Hydrochlorothiazide) .... One by mouth once daily 2)  Metformin Hcl 1000 Mg Tabs (Metformin hcl) .... One by mouth two times a day 3)  Adult Aspirin Low Strength 81 Mg Tbdp (Aspirin) .... One by mouth daily 4)  Freestyle Lite Test Strp (Glucose blood) .... To check glucose once  daily and as needed  250.0 5)  Glucometer  .... To check glucose once daily and as needed  250.0 6)  Glipizide Xl 5 Mg Xr24h-tab (Glipizide) .Marland Kitchen.. 1 by mouth once daily 7)  Freestyle Lancets Misc (Lancets) .... To check glucose once  daily and as needed  250.0 8)  Flax Oil (Flaxseed (linseed)) .... Take one daily 9)  Fish Oil Oil (Fish oil) .... Take two daily 10)  Lisinopril 5 Mg Tabs (Lisinopril) .Marland Kitchen.. 1 by mouth once daily 11)  Zyrtec Allergy 10 Mg Tabs (Cetirizine hcl) .Marland Kitchen.. 1 by mouth once daily as needed 12)  Beconase Aq 42 Mcg/spray Susp (Beclomethasone diprop monohyd) .Marland Kitchen.. 1 spray in each nostril 13)  Cialis 20 Mg Tabs (Tadalafil) .Marland Kitchen.. 1 by mouth once daily as needed as directed 14)  Zantac 150 Mg Tabs (Ranitidine hcl) .Marland Kitchen.. 1 by mouth two times a day 15)  Jalyn 0.5-0.4 Mg Caps (Dutasteride-tamsulosin hcl) .... One capsule by mouth twice a day  Patient Instructions: 1)  keep working on weight loss 2)  take the zantac just as needed  3)  if you have indigestion more than twice per week- please go back to two times a day zantac  4)  schedule fasting labs and then follow up at end of feb --  lipid/ast/alt/renal / and AIC 250.0 and 272 5)  I'm glad you are making the diet changes    Orders Added: 1)  Est. Patient Level III [52841]    Current Allergies (reviewed today): ! ZOCOR CLARITIN

## 2010-08-03 NOTE — Consult Note (Signed)
Summary: [facility]   Imported By: Lanelle Bal 06/21/2010 08:25:48  _____________________________________________________________________  External Attachment:    Type:   Image     Comment:   External Document

## 2010-08-03 NOTE — Letter (Signed)
Summary: Colonoscopy Letter  [facility]  [address]   [place], [place] [address]   Phone: [phone]  Fax: [phone]      May 30, 2010 MRN: [id number]   Ryan Garcia [address], [place]  [address]   Dear Mr. GASPARINI,   According to your medical record, it is time for you to schedule a Colonoscopy. The American Cancer Society recommends this procedure as a method to detect early colon cancer. Patients with a family history of colon cancer, or a personal history of colon polyps or inflammatory bowel disease are at increased risk.  This letter has been generated based on the recommendations made at the time of your procedure. If you feel that in your particular situation this may no longer apply, please contact our office.  Please call our office at [phone] to schedule this appointment or to update your records at your earliest convenience.  Thank you for cooperating with [place] to provide you with the very best care possible.   Sincerely,   Barbette Hair. Arlyce Dice, M.D.  [facility] [phone]

## 2010-08-03 NOTE — Medication Information (Signed)
Summary: Prior Authorization & Approval for Beconase/[employer]  Prior Authorization & Approval for [employer]   Imported By: Lanelle Bal 03/08/2010 12:39:07  _____________________________________________________________________  External Attachment:    Type:   Image     Comment:   External Document

## 2010-08-03 NOTE — Letter (Signed)
Summary: Diabetic Instructions  [facility]  [address] [place], [place] [address]   Phone: [phone]  Fax: [phone]    Ryan Garcia 28-Nov-1933 MRN: [id number]   _ X_   ORAL DIABETIC MEDICATION INSTRUCTIONS  The day before your procedure:   Take your diabetic pill as you do normally  The day of your procedure:   Do not take your diabetic pill    We will check your blood sugar levels during the admission process and again in Recovery before discharging you home

## 2010-08-03 NOTE — Medication Information (Signed)
Summary: Prior Authorization & Approval for Cialis/[facility]  Prior Authorization & Approval for Cialis/[facility]   Imported By: Lanelle Bal 03/08/2010 12:38:13  _____________________________________________________________________  External Attachment:    Type:   Image     Comment:   External Document

## 2010-08-03 NOTE — Miscellaneous (Signed)
Summary: [facility]  Clinical Lists Changes  Medications: Added new medication of MOVIPREP 100 GM  SOLR (PEG-KCL-NACL-NASULF-NA ASC-C) As per prep instructions. - Signed Rx of MOVIPREP 100 GM  SOLR (PEG-KCL-NACL-NASULF-NA ASC-C) As per prep instructions.;  #1 x 0;  Signed;  Entered by: Durwin Glaze RN;  Authorized by: Louis Meckel MD;  Method used: Electronically to [facility] [address], [place], [place]  [address], Ph: [phone], Fax: [phone] Observations: Added new observation of ALLERGY REV: Done (07/20/2010 10:04)    Prescriptions: MOVIPREP 100 GM  SOLR (PEG-KCL-NACL-NASULF-NA ASC-C) As per prep instructions.  #1 x 0   Entered by:   Durwin Glaze RN   Authorized by:   Louis Meckel MD   Signed by:   Durwin Glaze RN on 07/20/2010   Method used:   Electronically to        [facility] [address] (retail)       [address]       [place], [place]  [address]       Ph: [phone]       Fax: [phone]   RxID:   [id number]

## 2010-08-03 NOTE — Letter (Signed)
Summary: SMN for CPAP Supplies/[facility]  SMN for CPAP Supplies/[facility]   Imported By: Sherian Rein 08/16/2009 14:10:24  _____________________________________________________________________  External Attachment:    Type:   Image     Comment:   External Document

## 2010-08-03 NOTE — Progress Notes (Signed)
Summary: pt requests cialis  Phone Note Call from Patient Call back at Home Phone [phone]   Caller: Patient Call For: Judith Part MD Summary of Call: Pt is asking for cialis to be called to [facility].  He has tried Biochemist, clinical and these didnt work.  He is asking for a high dose. Initial call taken by: Lowella Petties CMA,  February 17, 2010 8:11 AM  Follow-up for Phone Call        px written on EMR for call in can try 20 mg cialis Follow-up by: Judith Part MD,  February 17, 2010 8:15 AM  Additional Follow-up for Phone Call Additional follow up Details #1::        Patient notified as insturcted by telephone. Rx sent to pharmacy. Additional Follow-up by: Sydell Axon LPN,  February 17, 2010 10:49 AM    New/Updated Medications: CIALIS 20 MG TABS (TADALAFIL) 1 by mouth once daily as needed as directed Prescriptions: CIALIS 20 MG TABS (TADALAFIL) 1 by mouth once daily as needed as directed  #12 x 2   Entered by:   Sydell Axon LPN   Authorized by:   Judith Part MD   Signed by:   Sydell Axon LPN on 09/81/1914   Method used:   Electronically to        [facility] [address] (retail)       [address]       [place], [place]  [address]       Ph: [phone]       Fax: [phone]   RxID:   [id number]

## 2010-08-03 NOTE — Assessment & Plan Note (Signed)
Summary: F/U AFTER LABS / LFW   Vital Signs:  Patient profile:   77 year old Garcia Height:      71 inches Weight:      259.25 pounds BMI:     36.29 Temp:     98.5 degrees F oral Pulse rate:   72 / minute Pulse rhythm:   regular BP sitting:   145 / 60  (left arm) Cuff size:   large  Vitals Entered By: Lewanda Rife LPN (August 11, 2009 8:09 AM)  History of Present Illness: here for f/u of HTN and DM and cholesterol  wt is up 7 lb with bmi of 36  bp is up today   DM is worse with AIC of 6.8 up from 6.1  lipids up -- LDL up from 136 to 152.9-- pt has declined med for this (after zocor intolerance)  has had poor diet habits lately -- just eats what is availible   usually sugar in the am is 140s , -- usually higher later in the day 160- 200   also not exercising   allergies are bothering him     Allergies: 1)  ! Zocor  Past History:  Past Medical History: Last updated: 10/04/2006 Allergic rhinitis Skin cancer, hx of- basal cell Diabetes mellitus, type II GERD Hypertension Osteoarthritis  Past Surgical History: Last updated: 10/04/2006 Left ankle spurs (04/1998) Breast lump removed- benign (1997-1998) CAD- Cath, mild ? (1995) Colonoscopy- polyps, diverticulosis (04/2002)  Social History: Last updated: 11/21/2007 works at home non smoker  Risk Factors: Smoking Status: never (10/04/2006)  Review of Systems General:  Denies fatigue, fever, loss of appetite, and malaise. Eyes:  Denies blurring and eye irritation. ENT:  Complains of decreased hearing, nasal congestion, and postnasal drainage; denies ear discharge, sinus pressure, and sore throat. CV:  Denies chest pain or discomfort and palpitations. Resp:  Denies cough and wheezing. GI:  Denies indigestion and nausea. GU:  Denies urinary frequency. Derm:  Denies itching, lesion(s), poor wound healing, and rash. Neuro:  Denies numbness and tingling. Endo:  Denies cold intolerance, excessive thirst,  excessive urination, and heat intolerance. Heme:  Denies abnormal bruising and bleeding.   Impression & Recommendations:  Problem # 1:  HYPERCHOLESTEROLEMIA (ICD-272.0) Assessment Deteriorated  worse with poor diet and pt refuses medication  disc risks of high chol rev low sat fat diet  plans to do better  re check 3 m and f/u  Labs Reviewed: SGOT: 21 (08/09/2009)   SGPT: 32 (08/09/2009)   HDL:39.00 (08/09/2009), 33.2 (09/08/2008)  LDL:136 (09/08/2008), DEL (02/26/2008)  Chol:202 (08/09/2009), 184 (09/08/2008)  Trig:121.0 (08/09/2009), 76 (09/08/2008)  Orders: Prescription Created Electronically [phone])  Problem # 2:  HYPERTENSION (ICD-401.9) Assessment: Deteriorated  bp is worse today with wt gain and poor habits adding low dose ace for renal prot as well update if side eff lab and f/u in 3 mo  His updated medication list for this problem includes:    Hydrochlorothiazide 25 Mg Tabs (Hydrochlorothiazide) ..... One by mouth once daily    Lisinopril 5 Mg Tabs (Lisinopril) .Marland Kitchen... 1 by mouth once daily  BP today: 145/60 Prior BP: 132/70 (05/17/2009)  Labs Reviewed: K+: 4.3 (08/09/2009) Creat: : 1.1 (08/09/2009)   Chol: 202 (08/09/2009)   HDL: 39.00 (08/09/2009)   LDL: 136 (09/08/2008)   TG: 121.0 (08/09/2009)  Orders: Prescription Created Electronically [phone])  Problem # 3:  DIABETES MELLITUS, TYPE II (ICD-250.00) Assessment: Deteriorated  inc AIC with poor diet and wt gain disc healthy diet (  low simple sugar/ choose complex carbs/ low sat fat) diet and exercise in detail  no change in med- will work harder on lifestyle change ace added today  lab and f/u in 3 mo  adv to check sugar once daily  His updated medication list for this problem includes:    Metformin Hcl 1000 Mg Tabs (Metformin hcl) ..... One by mouth two times a day    Adult Aspirin Low Strength 81 Mg Tbdp (Aspirin) ..... One by mouth qd    Glipizide Xl 5 Mg Xr24h-tab (Glipizide) .Marland Kitchen... 1 by mouth once  daily    Lisinopril 5 Mg Tabs (Lisinopril) .Marland Kitchen... 1 by mouth once daily  Orders: Prescription Created Electronically [phone])  Problem # 4:  ALLERGIC RHINITIS (ICD-477.9) Assessment: Deteriorated zyrtec not working well- adv to try claritin and update  The following medications were removed from the medication list:    Zyrtec Allergy 10 Mg Tabs (Cetirizine hcl) ..... One by mouth daily as needed His updated medication list for this problem includes:    Beconase Aq 42 Mcg/spray Susp (Beclomethasone diprop monohyd) .Marland Kitchen... 1 spray in each nostril once daily    Claritin 10 Mg Tabs (Loratadine) .Marland Kitchen... 1 by mouth once daily  Complete Medication List: 1)  Hydrochlorothiazide 25 Mg Tabs (Hydrochlorothiazide) .... One by mouth once daily 2)  Metformin Hcl 1000 Mg Tabs (Metformin hcl) .... One by mouth two times a day 3)  Adult Aspirin Low Strength 81 Mg Tbdp (Aspirin) .... One by mouth qd 4)  Freestyle Lite Test Strp (Glucose blood) .... To check glucose twice  daily and as needed  250.0 5)  Glucometer  .... To check glucose once daily and as needed  250.0 6)  Glipizide Xl 5 Mg Xr24h-tab (Glipizide) .Marland Kitchen.. 1 by mouth once daily 7)  Freestyle Lancets Misc (Lancets) .... To check glucose twice  daily and as needed  250.0 8)  Beconase Aq 42 Mcg/spray Susp (Beclomethasone diprop monohyd) .Marland Kitchen.. 1 spray in each nostril once daily 9)  Levitra 20 Mg Tabs (Vardenafil hcl) .Marland Kitchen.. 1 by mouth as directed 30 min before sexual activity 10)  Flax Oil (Flaxseed (linseed)) .... Take one daily 11)  Fish Oil Oil (Fish oil) .... Take two daily 12)  Lisinopril 5 Mg Tabs (Lisinopril) .Marland Kitchen.. 1 by mouth once daily 13)  Claritin 10 Mg Tabs (Loratadine) .Marland Kitchen.. 1 by mouth once daily  Patient Instructions: 1)  stop zyrtec and try claritin instead 10 mg daily to see if that works better 2)  work hard on diet and exercise and weight loss  3)  start lisinopril - to protect kidneys from diabetes - one pill daily  4)  update me if any  problems  5)  schedule fasting labs and follow up in 3 months lipid/ast/alt/renal/ AIC 250.0, 272  6)  I sent your px to the pharmacy Prescriptions: BECONASE AQ 42 MCG/SPRAY SUSP (BECLOMETHASONE DIPROP MONOHYD) 1 spray in each nostril once daily  #3 mdi x 3   Entered and Authorized by:   Judith Part MD   Signed by:   Judith Part MD on 08/11/2009   Method used:   Electronically to        [facility] [address] [address] (retail)       [address]       [place], [place]  [address]       Ph: [phone]       Fax: [phone]   RxID:   [id number] GLIPIZIDE XL  5 MG XR24H-TAB (GLIPIZIDE) 1 by mouth once daily  #90 x 3   Entered and Authorized by:   Judith Part MD   Signed by:   Judith Part MD on 08/11/2009   Method used:   Electronically to        [facility] [address] [address] (retail)       [address]       [place], [place]  [address]       Ph: [phone]       Fax: [phone]   RxID:   [id number] METFORMIN HCL 1000 MG  TABS (METFORMIN HCL) one by mouth two times a day  #180 x 3   Entered and Authorized by:   Judith Part MD   Signed by:   Judith Part MD on 08/11/2009   Method used:   Electronically to        [facility] [address] (retail)       [address]       [place], [place]  [address]       Ph: [phone]       Fax: [phone]   RxID:   [id number] HYDROCHLOROTHIAZIDE 25 MG  TABS (HYDROCHLOROTHIAZIDE) one by mouth once daily  #90 x 3   Entered and Authorized by:   Judith Part MD   Signed by:   Judith Part MD on 08/11/2009   Method used:   Electronically to        [facility] [address] (retail)       [address]       [place], [place]  [address]       Ph: [phone]       Fax: [phone]   RxID:   [id number] LISINOPRIL 5 MG TABS (LISINOPRIL) 1 by mouth once daily  #30 x 11   Entered and Authorized by:   Judith Part MD   Signed by:   Judith Part MD on 08/11/2009   Method used:    Electronically to        [facility] [address] (retail)       [address]       [place], [place]  [address]       Ph: [phone]       Fax: [phone]   RxID:   [id number]   Current Allergies (reviewed today): ! ZOCOR

## 2010-08-03 NOTE — Assessment & Plan Note (Signed)
Summary: RIGHT SHOULDER PAIN/CLE   Vital Signs:  Patient profile:   77 year old male Height:      71 inches Weight:      255.25 pounds BMI:     35.73 Temp:     98.2 degrees F oral Pulse rate:   68 / minute Pulse rhythm:   regular BP sitting:   138 / 68  (left arm) Cuff size:   large  Vitals Entered By: Lewanda Rife LPN (January 13, 2010 8:44 AM) CC: rt shoulder blade pain   History of Present Illness: gets sharp stabbing pain in back of shoulder pain- comes and goes  put up with it for about a month  is not severe but bothersome is still happening but less frequent  movement in just the right way brings it on -- once when reaching down to get anything  no new activity   never had it before  no rash    certain ways she sleeps hands will get numb  no weakness   has not taken anything for it   also having some allergy problems certain times of year  in past took zyrtec for breakthrough symptoms  they make him more sleepy   will be moving on to flonase - insurance no longer pays for beconase has had nosebleeds from other brands in the past  he is angry about this      Allergies: 1)  ! Zocor 2)  Claritin  Past History:  Past Medical History: Last updated: 10/04/2006 Allergic rhinitis Skin cancer, hx of- basal cell Diabetes mellitus, type II GERD Hypertension Osteoarthritis  Past Surgical History: Last updated: 10/04/2006 Left ankle spurs (04/1998) Breast lump removed- benign (1997-1998) CAD- Cath, mild ? (1995) Colonoscopy- polyps, diverticulosis (04/2002)  Social History: Last updated: 11/21/2007 works at home non smoker  Risk Factors: Smoking Status: never (10/04/2006)  Review of Systems General:  Denies fatigue, loss of appetite, and malaise. Eyes:  Denies blurring and eye irritation. ENT:  Complains of nasal congestion, postnasal drainage, and sinus pressure; denies sore throat. CV:  Denies chest pain or discomfort and palpitations. Resp:   Denies cough and shortness of breath. GI:  Denies abdominal pain. MS:  Complains of mid back pain; denies joint redness, joint swelling, cramps, and muscle weakness. Derm:  Denies itching, lesion(s), poor wound healing, and rash. Neuro:  Denies numbness, tingling, and weakness. Heme:  Denies abnormal bruising and bleeding. Allergy:  Complains of seasonal allergies and sneezing.  Physical Exam  General:  overweight but generally well appearing  Head:  normocephalic, atraumatic, and no abnormalities observed.  no sinus tenderness  Eyes:  vision grossly intact, pupils equal, pupils round, and pupils reactive to light.   Ears:  TMs are clear with scant cerumen Nose:  nares are boggy but clear  Mouth:  pharynx pink and moist.   Neck:  supple with full rom and no masses or thyromegally, no JVD or carotid bruit  Lungs:  Normal respiratory effort, chest expands symmetrically. Lungs are clear to auscultation, no crackles or wheezes. Heart:  Normal rate and regular rhythm. S1 and S2 normal without gallop, murmur, click, rub or other extra sounds. Abdomen:  soft and non-tender.   Msk:  tender med to R scalpula with some muscular tightness nl rom UE  some pain on tilt of neck and full flex Pulses:  R and L carotid,radial,femoral,dorsalis pedis and posterior tibial pulses are full and equal bilaterally Extremities:  no cce Neurologic:  strength normal in  all extremities, sensation intact to light touch, gait normal, and DTRs symmetrical and normal.   Skin:  Intact without suspicious lesions or rashes Cervical Nodes:  No lymphadenopathy noted Psych:  irritable today- is upset about insurance company and very frustrated about all of his medical problems    Impression & Recommendations:  Problem # 1:  BACK PAIN (ICD-724.5) Assessment New suspect muscle spasm- trap/ rhonboid/paraspinal  adv heat and stretching  offered pt and pt declined  adv to call if worse or if any numbness or weakness  His  updated medication list for this problem includes:    Adult Aspirin Low Strength 81 Mg Tbdp (Aspirin) ..... One by mouth daily  Problem # 2:  ALLERGIC RHINITIS (ICD-477.9) Assessment: Deteriorated pt has help from generic zyrtec but it makes him sleepy having seasonal flare  I suggested he try claritin or allegra otc as directed will finish beconase until it runs out and then switch to flonase due to cost The following medications were removed from the medication list:    Flonase 50 Mcg/act Susp (Fluticasone propionate) .Marland Kitchen... 2 sprays in each nostril once daily as needed His updated medication list for this problem includes:    Zyrtec Allergy 10 Mg Tabs (Cetirizine hcl) .Marland Kitchen... 1 by mouth once daily as needed    Beconase Aq 42 Mcg/spray Susp (Beclomethasone diprop monohyd) .Marland Kitchen... 1 spray in each nostril  Complete Medication List: 1)  Hydrochlorothiazide 25 Mg Tabs (Hydrochlorothiazide) .... One by mouth once daily 2)  Metformin Hcl 1000 Mg Tabs (Metformin hcl) .... One by mouth two times a day 3)  Adult Aspirin Low Strength 81 Mg Tbdp (Aspirin) .... One by mouth daily 4)  Freestyle Lite Test Strp (Glucose blood) .... To check glucose twice  daily and as needed  250.0 5)  Glucometer  .... To check glucose once daily and as needed  250.0 6)  Glipizide Xl 5 Mg Xr24h-tab (Glipizide) .Marland Kitchen.. 1 by mouth once daily 7)  Freestyle Lancets Misc (Lancets) .... To check glucose twice  daily and as needed  250.0 8)  Levitra 20 Mg Tabs (Vardenafil hcl) .Marland Kitchen.. 1 by mouth as directed 30 min before sexual activity 9)  Flax Oil (Flaxseed (linseed)) .... Take one daily 10)  Fish Oil Oil (Fish oil) .... Take two daily 11)  Lisinopril 5 Mg Tabs (Lisinopril) .Marland Kitchen.. 1 by mouth once daily 12)  Zyrtec Allergy 10 Mg Tabs (Cetirizine hcl) .Marland Kitchen.. 1 by mouth once daily as needed 13)  Beconase Aq 42 Mcg/spray Susp (Beclomethasone diprop monohyd) .Marland Kitchen.. 1 spray in each nostril  Patient Instructions: 1)  if you decide you want to  go to PT for your back pain call and let me know 2)  in the meantime -- use heat on it  3)  you can also sleep with a cervical support pillow - that can be helpful  4)  update me if worse 5)  if zyrtec makes you too sleepy -- you can try either claritin or allegra for allergies-they tend to be less sedating   Current Allergies (reviewed today): ! ZOCOR CLARITIN

## 2010-08-03 NOTE — Assessment & Plan Note (Signed)
Summary: 6 MONTH FOLLOW UP/[facility]   Vital Signs:  Patient profile:   77 year old male Height:      71 inches Weight:      257.25 pounds BMI:     36.01 Temp:     98.4 degrees F oral Pulse rate:   68 / minute Pulse rhythm:   regular BP sitting:   130 / 70  (left arm) Cuff size:   large  Vitals Entered By: Lewanda Rife LPN (May 15, 2010 8:27 AM)  Serial Vital Signs/Assessments:  Time      Position  BP       Pulse  Resp  Temp     By                     130/70                         Judith Part MD  CC: six month f/u   History of Present Illness: here for f/u of DM and HTN and lipids   is feeling ok - but has many small issues  was exercising and he quit -- was using xl glider -- used 30 min per day then went on vacation -- and did not get back to that  on vacation he does not watch diet or check sugars   eating too much lately   is having trouble with bladder control - leaks 2-3 drops  also hesitancy  hips hurt when he first gets up - then they warm up -- poss a little arthritis   L first toenail has a white mark   his wife thinks he is more sob on exertion   more heartburn lately -- occ gas ex  feels like he needs to belch  had cardiac cath in 1995  bottom of feet are tingly -- is aware of that coming from DM  no libido   wt is up 2 lb  HTN -- bp 140/68  DM is a bit worse - AIC is up from 6.6 to 7.0 is eating candy and cookies  on metformin and glipizide  is on ace  microalb up   lipids are up with trig 163 and HDL 36 and LDL 156 (up from 140) pt refuses med for this   ? flu shot --he declines   last opthy exam     Allergies: 1)  ! Zocor 2)  Claritin  Past History:  Past Surgical History: Last updated: 10/04/2006 Left ankle spurs (04/1998) Breast lump removed- benign (1610-9604) CAD- Cath, mild ? (1995) Colonoscopy- polyps, diverticulosis (04/2002)  Social History: Last updated: 11/21/2007 works at home non smoker  Risk  Factors: Smoking Status: never (10/04/2006)  Past Medical History: Allergic rhinitis Skin cancer, hx of- basal cell many skin cancers removed -- under constant treatment Diabetes mellitus, type II GERD Hypertension Osteoarthritis  Review of Systems General:  Denies chills, fatigue, fever, loss of appetite, and malaise. Eyes:  Denies blurring and eye irritation; wants to make his own eye doctor appt for DM exam. CV:  Complains of shortness of breath with exertion; denies chest pain or discomfort and palpitations; wife thinks he is sob on exertion- thinks it was better when he was exercising . Resp:  Denies cough, pleuritic, and wheezing. GI:  Denies abdominal pain, change in bowel habits, and nausea. GU:  Complains of erectile dysfunction, incontinence, nocturia, urinary frequency, and urinary hesitancy; denies discharge, dysuria, and hematuria.  Derm:  Denies itching, lesion(s), poor wound healing, and rash. Neuro:  Complains of tingling; denies numbness; tingling in bottom of feet without numbness sometimes they feel hot . Psych:  mood is about the same  has little motivation for change. Endo:  Denies cold intolerance, excessive thirst, excessive urination, and heat intolerance. Heme:  Denies abnormal bruising and bleeding.  Physical Exam  General:  overweight but generally well appearing  Head:  normocephalic, atraumatic, and no abnormalities observed.   Eyes:  vision grossly intact, pupils equal, pupils round, and pupils reactive to light.  no conjunctival pallor, injection or icterus  Ears:  very poor hearing today- forgot his hearing aides  Mouth:  pharynx pink and moist.   Neck:  supple with full rom and no masses or thyromegally, no JVD or carotid bruit  Chest Wall:  No deformities, masses, tenderness or gynecomastia noted. Lungs:  Normal respiratory effort, chest expands symmetrically. Lungs are clear to auscultation, no crackles or wheezes. Heart:  Normal rate and regular  rhythm. S1 and S2 normal without gallop, murmur, click, rub or other extra sounds. Abdomen:  soft and non-tender.  obese and protuberant no renal bruits no hsm Msk:  no acute joint changes  nl rom hips today Pulses:  R and L carotid,radial,femoral,dorsalis pedis and posterior tibial pulses are full and equal bilaterally Extremities:  no cce Neurologic:  sensation intact to light touch, gait normal, and DTRs symmetrical and normal.   Skin:  white mark on L first toenail laterally-- looks to be traumatic no thickening of nail or yellow /green color   Cervical Nodes:  No lymphadenopathy noted Inguinal Nodes:  No significant adenopathy Psych:  baseline affect - stoic and cannot hear well   Diabetes Management Exam:    Foot Exam (with socks and/or shoes not present):       Sensory-Pinprick/Light touch:          Left medial foot (L-4): normal          Left dorsal foot (L-5): normal          Left lateral foot (S-1): normal          Right medial foot (L-4): normal          Right dorsal foot (L-5): normal          Right lateral foot (S-1): normal       Sensory-Monofilament:          Left foot: normal          Right foot: normal       Inspection:          Left foot: normal          Right foot: normal       Nails:          Left foot: normal          Right foot: normal   Impression & Recommendations:  Problem # 1:  URINARY INCONTINENCE (ICD-788.30) Assessment New ref to urol for this and obstructive symptoms which are new Orders: Urology Referral (Urology)  Problem # 2:  HYPERCHOLESTEROLEMIA (ICD-272.0) Assessment: Deteriorated  this is worse with high fat diet/ fast food  pt declines med of any type rev low sat fat diet - pt not very motivated  Labs Reviewed: SGOT: 17 (05/09/2010)   SGPT: 26 (05/09/2010)   HDL:36.40 (05/09/2010), 35.50 (11/04/2009)  LDL:140 (11/04/2009), 136 (09/08/2008)  Chol:211 (05/09/2010), 200 (11/04/2009)  Trig:163.0 (05/09/2010), 125.0  (11/04/2009)  Problem # 3:  GERD (ICD-530.81) Assessment: Deteriorated  this is worse -- trial of zantac  diet has been poor lately-- ? if too many peppers as well  f/u 1 mo  if not imp consider disc of heart health  rev need for wt loss and better diet  His updated medication list for this problem includes:    Zantac 150 Mg Tabs (Ranitidine hcl) .Marland Kitchen... 1 by mouth two times a day  Orders: Prescription Created Electronically [id number])  Problem # 4:  HYPERTENSION (ICD-401.9) Assessment: Unchanged  bp better on 2nd check  no change in med  His updated medication list for this problem includes:    Hydrochlorothiazide 25 Mg Tabs (Hydrochlorothiazide) ..... One by mouth once daily    Lisinopril 5 Mg Tabs (Lisinopril) .Marland Kitchen... 1 by mouth once daily  BP today: 140/68-- re check 130/70 Prior BP: 138/68 (01/13/2010)  Labs Reviewed: K+: 4.3 (05/09/2010) Creat: : 1.1 (05/09/2010)   Chol: 211 (05/09/2010)   HDL: 36.40 (05/09/2010)   LDL: 140 (11/04/2009)   TG: 163.0 (05/09/2010)  Problem # 5:  DIABETES MELLITUS, TYPE II (ICD-250.00) worse from bad diet and lack of exercise  disc healthy diet (low simple sugar/ choose complex carbs/ low sat fat) diet and exercise in detail  pt not very motivated  if not imp next check may need to add med pt will make own eye appt  is having symptoms of neuropathy adv flu shot for pt - he declined (did not give reason)-- he was made aware of imp of flu vaccines in diabetic pts  His updated medication list for this problem includes:    Metformin Hcl 1000 Mg Tabs (Metformin hcl) ..... One by mouth two times a day    Adult Aspirin Low Strength 81 Mg Tbdp (Aspirin) ..... One by mouth daily    Glipizide Xl 5 Mg Xr24h-tab (Glipizide) .Marland Kitchen... 1 by mouth once daily    Lisinopril 5 Mg Tabs (Lisinopril) .Marland Kitchen... 1 by mouth once daily  Problem # 6:  CONTUSION OF TOE (ICD-924.3) Assessment: New I think that the white discoloration on medial L first toenail is traumatic --  will watch closely to see if it grows out  pt aware to call if any concerns/ pain or change  Complete Medication List: 1)  Hydrochlorothiazide 25 Mg Tabs (Hydrochlorothiazide) .... One by mouth once daily 2)  Metformin Hcl 1000 Mg Tabs (Metformin hcl) .... One by mouth two times a day 3)  Adult Aspirin Low Strength 81 Mg Tbdp (Aspirin) .... One by mouth daily 4)  Freestyle Lite Test Strp (Glucose blood) .... To check glucose once  daily and as needed  250.0 5)  Glucometer  .... To check glucose once daily and as needed  250.0 6)  Glipizide Xl 5 Mg Xr24h-tab (Glipizide) .Marland Kitchen.. 1 by mouth once daily 7)  Freestyle Lancets Misc (Lancets) .... To check glucose once  daily and as needed  250.0 8)  Flax Oil (Flaxseed (linseed)) .... Take one daily 9)  Fish Oil Oil (Fish oil) .... Take two daily 10)  Lisinopril 5 Mg Tabs (Lisinopril) .Marland Kitchen.. 1 by mouth once daily 11)  Zyrtec Allergy 10 Mg Tabs (Cetirizine hcl) .Marland Kitchen.. 1 by mouth once daily as needed 12)  Beconase Aq 42 Mcg/spray Susp (Beclomethasone diprop monohyd) .Marland Kitchen.. 1 spray in each nostril 13)  Cialis 20 Mg Tabs (Tadalafil) .Marland Kitchen.. 1 by mouth once daily as needed as directed 14)  Zantac 150 Mg Tabs (Ranitidine hcl) .Marland Kitchen.. 1 by mouth two times a day  Contraindications/Deferment of Procedures/Staging:    Test/Procedure: FLU VAX    Reason for deferment: patient declined   Patient Instructions: 1)  It is important that you exercise reguarly at least 20 minutes 5 times a week. If you develop chest pain, have severe difficulty breathing, or feel very tired, stop exercising immediately and seek medical attention.  2)  get back to diabetic diet  3)  you need yearly eye exam for diabetes -- please make your own appointment  4)  we will do urology referral at check out  5)  start zantac 150 mg 1 pill two times a day for indigestion and then follow up in a month  Prescriptions: ZANTAC 150 MG TABS (RANITIDINE HCL) 1 by mouth two times a day  #60 x 3   Entered and  Authorized by:   Judith Part MD   Signed by:   Judith Part MD on 05/15/2010   Method used:   Electronically to        [facility] [address] (retail)       [address]       [place], [place]  [address]       Ph: [phone]       Fax: [phone]   RxID:   [id number]    Orders Added: 1)  Urology Referral [Urology] 2)  Prescription Created Electronically [[id number]] 3)  Est. Patient Level IV [36644]    Current Allergies (reviewed today): ! ZOCOR CLARITIN

## 2010-08-03 NOTE — Progress Notes (Signed)
Summary: prior Berkley Harvey is needed for cialis  Phone Note From Pharmacy   Caller: [facility] Summary of Call: Prior Berkley Harvey is needed for cialis, form is on your shelf. Initial call taken by: Lowella Petties CMA,  February 24, 2010 2:18 PM  Follow-up for Phone Call        form done and in nurse in box  Follow-up by: Judith Part MD,  February 24, 2010 3:26 PM  Additional Follow-up for Phone Call Additional follow up Details #1::        Form faxed. Additional Follow-up by: Lowella Petties CMA,  February 24, 2010 4:24 PM     Appended Document: prior Berkley Harvey is needed for cialis Prior auth given for cialis, approval letter placed on doctors desk for signature and scanning.

## 2010-08-08 ENCOUNTER — Other Ambulatory Visit (AMBULATORY_SURGERY_CENTER): Payer: Medicare Other | Admitting: Gastroenterology

## 2010-08-08 ENCOUNTER — Other Ambulatory Visit: Payer: Self-pay | Admitting: Gastroenterology

## 2010-08-08 DIAGNOSIS — D126 Benign neoplasm of colon, unspecified: Secondary | ICD-10-CM

## 2010-08-08 DIAGNOSIS — Z1211 Encounter for screening for malignant neoplasm of colon: Secondary | ICD-10-CM

## 2010-08-08 DIAGNOSIS — K5730 Diverticulosis of large intestine without perforation or abscess without bleeding: Secondary | ICD-10-CM

## 2010-08-08 DIAGNOSIS — D128 Benign neoplasm of rectum: Secondary | ICD-10-CM

## 2010-08-08 DIAGNOSIS — D129 Benign neoplasm of anus and anal canal: Secondary | ICD-10-CM

## 2010-08-08 LAB — HM COLONOSCOPY

## 2010-08-14 ENCOUNTER — Telehealth: Payer: Self-pay | Admitting: Family Medicine

## 2010-08-15 ENCOUNTER — Encounter: Payer: Self-pay | Admitting: Gastroenterology

## 2010-08-23 NOTE — Procedures (Addendum)
Summary: Colonoscopy   Colonoscopy  Procedure date:  08/08/2010  Findings:      Location:  [facility].   COLONOSCOPY PROCEDURE REPORT  PATIENT:  Ryan Garcia, Ryan Garcia  MR#:  [id number] BIRTHDATE:   [birth year], 76 yrs. old   GENDER:   male  ENDOSCOPIST:   Barbette Hair. Arlyce Dice, MD Referred by:   PROCEDURE DATE:  08/08/2010 PROCEDURE:  Colonoscopy with snare polypectomy, Colon with cold biopsy polypectomy ASA CLASS:   Class II INDICATIONS: 1) screening  2) history of pre-cancerous (adenomatous) colon polyps Index polypectomy 2006  MEDICATIONS:    Fentanyl 50 mcg IV, Versed 4 mg IV  DESCRIPTION OF PROCEDURE:   After the risks benefits and alternatives of the procedure were thoroughly explained, informed consent was obtained.  Digital rectal exam was performed and revealed no abnormalities.   The LB 180AL [id number] endoscope was introduced through the anus and advanced to the cecum, which was identified by both the appendix and ileocecal valve, without limitations.  The quality of the prep was good, using MoviPrep.  The instrument was then slowly withdrawn as the colon was fully examined. <<PROCEDUREIMAGES>>                  <<OLD IMAGES>>  FINDINGS:  Two polyps were found in the ascending colon (see image7 and image10). 2 3mm sessile polyps - removed with cold polypectomy snare, submitted to pathology  Scattered diverticula were found in the ascending colon (see image1).  Moderate diverticulosis was found in the sigmoid colon (see image14).  This was otherwise a normal examination of the colon (see image2, image5, image11, image15, and image16).  A diminutive polyp was found in the descending colon. The polyp was removed using cold biopsy forceps (see image12).   Retroflexed views in the rectum revealed no abnormalities.    The time to cecum =  2.75  minutes. The scope was then withdrawn (time =  15.0  min) from the patient and the procedure completed.  COMPLICATIONS:   None  ENDOSCOPIC  IMPRESSION:  1) Two polyps in the ascending colon  2) Diverticula, scattered in the ascending colon  3) Moderate diverticulosis in the sigmoid colon  4) Diminutive polyp in the descending colon  5) Otherwise normal examination RECOMMENDATIONS:  1) If the polyp(s) removed today are proven to be adenomatous (pre-cancerous) polyps, you will need a repeat colonoscopy in 5 years. Otherwise you should continue to follow colorectal cancer screening guidelines for "routine risk" patients with colonoscopy in 10 years.  REPEAT EXAM:    You will receive a letter from Dr. Arlyce Dice in 1-2 weeks, after reviewing the final pathology, with followup recommendations.   _______________________________ Barbette Hair Arlyce Dice, MD    CC: Judy Pimple, MD    Appended Document: Colonoscopy     Procedures Next Due Date:    Colonoscopy: 08/2015

## 2010-08-23 NOTE — Progress Notes (Signed)
Summary: wants something for enlarged prostate  Phone Note Call from Patient Call back at Home Phone [phone]   Caller: Patient Call For: Judith Part MD Summary of Call: Pt saw Dr. Artis Flock.  He put him on jalyn for enlarged prostate but this is too expensive.  He called Dr. Blair Heys office to see if he would change to something less costly, but they told him he would need another office visit, which he thinks isnt necessary.  He is asking if you will change him to something else.  Uses [facility] [place]. Initial call taken by: Lowella Petties CMA, AAMA,  August 14, 2010 10:37 AM  Follow-up for Phone Call        I think that since treating physician is Dr Artis Flock -- he needs to stick with that recommendation please fax this phone note with attn to Dr Artis Flock it looks like her was seen 11/30 and 1 month f/u was recommended please request other records-that would help me out-thanks Follow-up by: Judith Part MD,  August 14, 2010 1:44 PM  Additional Follow-up for Phone Call Additional follow up Details #1::        Left message for patient to call back. Spoke with Dr Blair Heys office and will fax this note to [phone]. Requested 07/2010 note faxed to Dr Milinda Antis.Lewanda Rife LPN  August 14, 2010 3:44 PM   Advised pt's wife that pt needs to follow up with Dr. Artis Flock. Additional Follow-up by: Lowella Petties CMA, AAMA,  August 14, 2010 3:51 PM     Appended Document: wants something for enlarged prostate Note from Dr [facility] office is on your shelf in the in box. Thank you.

## 2010-08-23 NOTE — Letter (Signed)
Summary: Patient Notice- Polyp Results  [facility]  [address] [place], [place] [address]   Phone: [phone]  Fax: [phone]        August 15, 2010 MRN: [id number]    Ryan Garcia [address] [place], [place]  [address]    Dear Ryan Garcia,  I am pleased to inform you that the colon polyp(s) removed during your recent colonoscopy was (were) found to be benign (no cancer detected) upon pathologic examination.  I recommend you have a repeat colonoscopy examination in 5_ years to look for recurrent polyps, as having colon polyps increases your risk for having recurrent polyps or even colon cancer in the future.  Should you develop new or worsening symptoms of abdominal pain, bowel habit changes or bleeding from the rectum or bowels, please schedule an evaluation with either your primary care physician or with me.  Additional information/recommendations:  __ No further action with gastroenterology is needed at this time. Please      follow-up with your primary care physician for your other healthcare      needs.  __ Please call [phone] to schedule a return visit to review your      situation.  __ Please keep your follow-up visit as already scheduled.  _x_ Continue treatment plan as outlined the day of your exam.  Please call us if you are having persistent problems or have questions about your condition that have not been fully answered at this time.  Sincerely,  Louis Meckel MD  This letter has been electronically signed by your physician.  Appended Document: Patient Notice- Polyp Results Letter Mailed

## 2010-08-24 ENCOUNTER — Other Ambulatory Visit: Payer: Self-pay | Admitting: Family Medicine

## 2010-08-24 ENCOUNTER — Encounter (INDEPENDENT_AMBULATORY_CARE_PROVIDER_SITE_OTHER): Payer: Self-pay | Admitting: *Deleted

## 2010-08-24 ENCOUNTER — Other Ambulatory Visit (INDEPENDENT_AMBULATORY_CARE_PROVIDER_SITE_OTHER): Payer: Medicare Other

## 2010-08-24 DIAGNOSIS — E7800 Pure hypercholesterolemia, unspecified: Secondary | ICD-10-CM

## 2010-08-24 DIAGNOSIS — E119 Type 2 diabetes mellitus without complications: Secondary | ICD-10-CM

## 2010-08-24 LAB — LIPID PANEL
Cholesterol: 188 mg/dL (ref 0–200)
HDL: 32.8 mg/dL — ABNORMAL LOW (ref >39.00)
Total CHOL/HDL Ratio: 6
Triglycerides: 177 mg/dL — ABNORMAL HIGH (ref 0.0–149.0)

## 2010-08-24 LAB — HEMOGLOBIN A1C: Hgb A1c MFr Bld: 7.2 % — ABNORMAL HIGH (ref 4.6–6.5)

## 2010-08-24 LAB — RENAL FUNCTION PANEL
BUN: 21 mg/dL (ref 6–23)
CO2: 29 mEq/L (ref 19–32)
Calcium: 9.3 mg/dL (ref 8.4–10.5)
Chloride: 104 mEq/L (ref 96–112)
GFR: 70.94 mL/min (ref >60.00)

## 2010-08-29 NOTE — Letter (Signed)
Summary: [facility]   Imported By: Kassie Mends 08/22/2010 11:19:00  _____________________________________________________________________  External Attachment:    Type:   Image     Comment:   External Document

## 2010-08-30 ENCOUNTER — Ambulatory Visit: Payer: Medicare Other | Admitting: Family Medicine

## 2010-09-06 ENCOUNTER — Ambulatory Visit (INDEPENDENT_AMBULATORY_CARE_PROVIDER_SITE_OTHER): Payer: Medicare Other | Admitting: Family Medicine

## 2010-09-06 ENCOUNTER — Ambulatory Visit: Payer: Medicare Other | Admitting: Family Medicine

## 2010-09-06 ENCOUNTER — Encounter: Payer: Self-pay | Admitting: Family Medicine

## 2010-09-06 DIAGNOSIS — E119 Type 2 diabetes mellitus without complications: Secondary | ICD-10-CM

## 2010-09-06 DIAGNOSIS — I10 Essential (primary) hypertension: Secondary | ICD-10-CM

## 2010-09-06 DIAGNOSIS — E7800 Pure hypercholesterolemia, unspecified: Secondary | ICD-10-CM

## 2010-09-11 ENCOUNTER — Ambulatory Visit: Payer: Medicare Other | Admitting: Family Medicine

## 2010-09-19 NOTE — Assessment & Plan Note (Signed)
Summary: F/U   Vital Signs:  Patient profile:   77 year old male Height:      71 inches Weight:      252.25 pounds BMI:     35.31 Temp:     98.5 degrees F oral Pulse rate:   72 / minute Pulse rhythm:   regular BP sitting:   116 / 64  (left arm) Cuff size:   large  Vitals Entered By: Lewanda Rife LPN (September 05, 452 8:57 AM) CC: follow-up visit   History of Present Illness: here for f/u of lipids and HTN and DM  last week had cold microtx on prostate - was quite miserable  had a lot of soreness and swelling  finally is getting better    wt is down 1 more lb  bp well controlled 116/64  lipids are improved trig 177, HDL 32 and LDL 120 (down from 156) quite an improvement -- thinks that is from working and getting more active  needs more green veg in diet    AIC 7.2 today up from 7 opthy-- long time ago  has been eating way too much sweets and not checking sugar regularly   Allergies: 1)  ! Zocor 2)  Claritin  Past History:  Social History: Last updated: 11/21/2007 works at home non smoker  Risk Factors: Smoking Status: never (10/04/2006)  Past Medical History: Allergic rhinitis Skin cancer, hx of- basal cell many skin cancers removed -- under constant treatment Diabetes mellitus, type II GERD Hypertension Osteoarthritis bph - microwave tx of prostate  Past Surgical History: Left ankle spurs (04/1998) Breast lump removed- benign (0981-1914) CAD- Cath, mild ? (1995) Colonoscopy- polyps, diverticulosis (04/2002) bph -microwave tx of prostate   Review of Systems General:  Denies fatigue, loss of appetite, and malaise. Eyes:  Denies blurring and eye irritation. CV:  Denies chest pain or discomfort, palpitations, and shortness of breath with exertion. Resp:  Denies cough and wheezing. GI:  Denies abdominal pain, change in bowel habits, indigestion, and nausea. GU:  Denies hematuria and urinary frequency. MS:  Denies joint pain, muscle aches, and  stiffness. Derm:  Denies itching, lesion(s), poor wound healing, and rash. Neuro:  Denies numbness and tingling. Endo:  Denies cold intolerance, excessive thirst, excessive urination, and heat intolerance. Heme:  Denies abnormal bruising and bleeding.  Physical Exam  General:  overweight but generally well appearing  Head:  normocephalic, atraumatic, and no abnormalities observed.   Eyes:  vision grossly intact, pupils equal, pupils round, and pupils reactive to light.   Mouth:  pharynx pink and moist.   Neck:  supple with full rom and no masses or thyromegally, no JVD or carotid bruit  Chest Wall:  No deformities, masses, tenderness or gynecomastia noted. Lungs:  Normal respiratory effort, chest expands symmetrically. Lungs are clear to auscultation, no crackles or wheezes. Heart:  Normal rate and regular rhythm. S1 and S2 normal without gallop, murmur, click, rub or other extra sounds. Abdomen:  soft, non-tender, and normal bowel sounds.  no renal bruits  Msk:  No deformity or scoliosis noted of thoracic or lumbar spine.   Pulses:  R and L carotid,radial,femoral,dorsalis pedis and posterior tibial pulses are full and equal bilaterally Extremities:  no cce Neurologic:  sensation intact to light touch and DTRs symmetrical and normal.   Skin:  Intact without suspicious lesions or rashes Cervical Nodes:  No lymphadenopathy noted Inguinal Nodes:  No significant adenopathy Psych:  normal affect, talkative and pleasant   Diabetes  Management Exam:    Foot Exam (with socks and/or shoes not present):       Sensory-Pinprick/Light touch:          Left medial foot (L-4): normal          Left dorsal foot (L-5): normal          Left lateral foot (S-1): normal          Right medial foot (L-4): normal          Right dorsal foot (L-5): normal          Right lateral foot (S-1): normal       Sensory-Monofilament:          Left foot: normal          Right foot: normal       Inspection:           Left foot: normal          Right foot: normal       Nails:          Left foot: normal          Right foot: normal   Impression & Recommendations:  Problem # 1:  HYPERCHOLESTEROLEMIA (ICD-272.0) Assessment Improved  this is improved- commended on better low fat diet and exercise rev labs with pt  disc low sat fat diet   Labs Reviewed: SGOT: 17 (08/24/2010)   SGPT: 24 (08/24/2010)   HDL:32.80 (08/24/2010), 36.40 (05/09/2010)  LDL:120 (08/24/2010), 140 (11/04/2009)  Chol:188 (08/24/2010), 211 (05/09/2010)  Trig:177.0 (08/24/2010), 163.0 (05/09/2010)  Orders: Prescription Created Electronically [phone])  Problem # 2:  HYPERTENSION (ICD-401.9) Assessment: Unchanged  good control without change meds refilled  His updated medication list for this problem includes:    Hydrochlorothiazide 25 Mg Tabs (Hydrochlorothiazide) ..... One by mouth once daily    Lisinopril 5 Mg Tabs (Lisinopril) .Marland Kitchen... 1 by mouth once daily  BP today: 116/64 Prior BP: 134/60 (06/23/2010)  Labs Reviewed: K+: 4.2 (08/24/2010) Creat: : 1.1 (08/24/2010)   Chol: 188 (08/24/2010)   HDL: 32.80 (08/24/2010)   LDL: 120 (08/24/2010)   TG: 177.0 (08/24/2010)  Orders: Prescription Created Electronically [id number])  Problem # 3:  DIABETES MELLITUS, TYPE II (ICD-250.00) Assessment: Deteriorated  this is worse from eating sweets  rev labs with pt  would opt like him to check sugar two times a day  also stop sweets/ cut starches / more exercise when able he will make own opthy appt  lab and f/u 3 mo  His updated medication list for this problem includes:    Metformin Hcl 1000 Mg Tabs (Metformin hcl) ..... One by mouth two times a day    Adult Aspirin Low Strength 81 Mg Tbdp (Aspirin) ..... One by mouth daily    Glipizide Xl 5 Mg Xr24h-tab (Glipizide) .Marland Kitchen... 1 by mouth once daily    Lisinopril 5 Mg Tabs (Lisinopril) .Marland Kitchen... 1 by mouth once daily  Labs Reviewed: Creat: 1.1 (08/24/2010)     Last Eye Exam: normal  (10/30/2008) Reviewed HgBA1c results: 7.2 (08/24/2010)  7.0 (05/09/2010)  Orders: Prescription Created Electronically [id number])  Complete Medication List: 1)  Hydrochlorothiazide 25 Mg Tabs (Hydrochlorothiazide) .... One by mouth once daily 2)  Metformin Hcl 1000 Mg Tabs (Metformin hcl) .... One by mouth two times a day 3)  Adult Aspirin Low Strength 81 Mg Tbdp (Aspirin) .... One by mouth daily 4)  Freestyle Lite Test Strp (Glucose blood) .... To check glucose once  daily and as needed  250.0 5)  Glucometer  .... To check glucose once daily and as needed  250.0 6)  Glipizide Xl 5 Mg Xr24h-tab (Glipizide) .Marland Kitchen.. 1 by mouth once daily 7)  Freestyle Lancets Misc (Lancets) .... To check glucose once  daily and as needed  250.0 8)  Flax Oil (Flaxseed (linseed)) .... Take one daily 9)  Fish Oil Oil (Fish oil) .... Take two daily 10)  Lisinopril 5 Mg Tabs (Lisinopril) .Marland Kitchen.. 1 by mouth once daily 11)  Zyrtec Allergy 10 Mg Tabs (Cetirizine hcl) .Marland Kitchen.. 1 by mouth once daily as needed 12)  Beconase Aq 42 Mcg/spray Susp (Beclomethasone diprop monohyd) .Marland Kitchen.. 1 spray in each nostril 13)  Cialis 20 Mg Tabs (Tadalafil) .Marland Kitchen.. 1 by mouth once daily as needed as directed 14)  Zantac 150 Mg Tabs (Ranitidine hcl) .Marland Kitchen.. 1 by mouth two times a day as needed 15)  Tamsulosin Hcl 0.4 Mg Caps (Tamsulosin hcl) .... Take 1 capsule by mouth once a day 16)  Levofloxacin 500 Mg Tabs (Levofloxacin) .... Take 1 tablet by mouth once a day 17)  Finasteride 5 Mg Tabs (Finasteride) .... Take 1 tablet by mouth once a day  Patient Instructions: 1)  please don't forget to make your yearly eye exam 2)  work on diet - no more sweets and cut starches 3)  stay active  4)  check sugar two times a day --am fasting and 2 hours after evening meal  5)  bring your log to the next visit  6)  good job wit the cholesterol  7)  schedule f/u in 3 mo with labs prior -- AIC and renal 250.0  Prescriptions: GLIPIZIDE XL 5 MG XR24H-TAB (GLIPIZIDE) 1  by mouth once daily  #90 x 3   Entered and Authorized by:   Judith Part MD   Signed by:   Judith Part MD on 09/06/2010   Method used:   Electronically to        [facility] [address]* (retail)       [address]       [place], [place]  [address]       Ph: [phone]       Fax: [phone]   RxID:   [id number] METFORMIN HCL 1000 MG  TABS (METFORMIN HCL) one by mouth two times a day  #180 x 3   Entered and Authorized by:   Judith Part MD   Signed by:   Judith Part MD on 09/06/2010   Method used:   Electronically to        [facility] [address]* (retail)       [address]       [place], [place]  [address]       Ph: [phone]       Fax: [phone]   RxID:   [id number] HYDROCHLOROTHIAZIDE 25 MG  TABS (HYDROCHLOROTHIAZIDE) one by mouth once daily  #90 x 3   Entered and Authorized by:   Judith Part MD   Signed by:   Judith Part MD on 09/06/2010   Method used:   Electronically to        [facility] [address] (retail)       [address]       [place], [place]  [address]       Ph: [phone]       Fax: [phone]   RxID:   [id number]    Orders Added: 1)  Prescription Created Electronically [[id number]] 2)  Est. Patient Level IV [57846]    Current Allergies (reviewed today): ! ZOCOR CLARITIN

## 2010-09-20 ENCOUNTER — Ambulatory Visit: Payer: Medicare Other | Admitting: Family Medicine

## 2010-11-27 ENCOUNTER — Encounter: Payer: Self-pay | Admitting: Family Medicine

## 2010-12-05 ENCOUNTER — Other Ambulatory Visit (INDEPENDENT_AMBULATORY_CARE_PROVIDER_SITE_OTHER): Payer: Medicare Other | Admitting: Family Medicine

## 2010-12-05 DIAGNOSIS — E119 Type 2 diabetes mellitus without complications: Secondary | ICD-10-CM

## 2010-12-05 LAB — RENAL FUNCTION PANEL
BUN: 17 mg/dL (ref 6–23)
CO2: 26 mEq/L (ref 19–32)
Chloride: 108 mEq/L (ref 96–112)
Creatinine, Ser: 1 mg/dL (ref 0.4–1.5)
GFR: 77.47 mL/min (ref >60.00)
Phosphorus: 2.9 mg/dL (ref 2.3–4.6)
Sodium: 140 mEq/L (ref 135–145)

## 2010-12-05 LAB — HEMOGLOBIN A1C: Hgb A1c MFr Bld: 6.7 % — ABNORMAL HIGH (ref 4.6–6.5)

## 2010-12-08 ENCOUNTER — Encounter: Payer: Self-pay | Admitting: Family Medicine

## 2010-12-08 ENCOUNTER — Ambulatory Visit (INDEPENDENT_AMBULATORY_CARE_PROVIDER_SITE_OTHER): Payer: Medicare Other | Admitting: Family Medicine

## 2010-12-08 DIAGNOSIS — E7800 Pure hypercholesterolemia, unspecified: Secondary | ICD-10-CM

## 2010-12-08 DIAGNOSIS — E119 Type 2 diabetes mellitus without complications: Secondary | ICD-10-CM

## 2010-12-08 DIAGNOSIS — I10 Essential (primary) hypertension: Secondary | ICD-10-CM

## 2010-12-08 NOTE — Progress Notes (Signed)
Subjective:    Patient ID: Ryan Garcia, male    DOB: Jul 15, 1933, 77 y.o.   MRN: [id number]  HPI Here for f/u for DM and HTN   bp is great at 120/60 No ha or cp or sob   Dm is better !--a1c is 6.7 down from 7.2 Is more accountable now -- and checking sugar bid  Last month a little more sugar in diet  Too many sweets  Sugar averaging 120s-130s am (occas higher) Pm is 140s-150s  opthy up to date - from last month  On ace   Also more active because it is summer time    Patient Active Problem List  Diagnoses  . DIABETES MELLITUS, TYPE II  . HYPERCHOLESTEROLEMIA  . OBESITY  . ERECTILE DYSFUNCTION  . HYPERTENSION  . ALLERGIC RHINITIS  . GERD  . OSTEOARTHRITIS  . BACK PAIN  . SLEEP APNEA  . EDEMA  . URINARY INCONTINENCE  . SKIN CANCER, HX OF   Past Medical History  Diagnosis Date  . Allergy     allergic rhinitis  . Hx of skin cancer, basal cell     many skin cancers removed/under constant treatment.  . Diabetes mellitus     type II  . GERD (gastroesophageal reflux disease)   . Hypertension   . Arthritis     OA  . BPH (benign prostatic hyperplasia)     microwave tx of prostate  . AC (acromioclavicular) joint bone spurs     lt ankle   Past Surgical History  Procedure Date  . Breast surgery 2009    breast lump removed benign   History  Substance Use Topics  . Smoking status: Never Smoker   . Smokeless tobacco: Not on file  . Alcohol Use:    No family history on file. Allergies  Allergen Reactions  . Loratadine     REACTION: not effective  . Simvastatin     REACTION: intolerant   Current Outpatient Prescriptions on File Prior to Visit  Medication Sig Dispense Refill  . aspirin 81 MG tablet Take 81 mg by mouth daily.        . beclomethasone (BECONASE AQ) 42 MCG/SPRAY nasal spray Place 1 spray into the nose daily. Dose is for each nostril.       . Flax OIL Take 1 capsule by mouth daily.        Marland Kitchen glipiZIDE (GLUCOTROL) 5 MG 24 hr tablet Take 5 mg by  mouth daily.        Marland Kitchen glucose blood (FREESTYLE LITE) test strip Check blood sugar once daily and as needed. 250.0       . glucose monitoring kit (FREESTYLE) monitoring kit Check blood sugar once a day and as needed 250.0       . hydrochlorothiazide 25 MG tablet Take 25 mg by mouth daily.        . Lancets (FREESTYLE) lancets Check glucose once daily and as needed.250.0       . lisinopril (PRINIVIL,ZESTRIL) 5 MG tablet Take 5 mg by mouth daily.        . metFORMIN (GLUCOPHAGE) 1000 MG tablet Take 1,000 mg by mouth 2 (two) times daily with a meal.        . Omega-3 Fatty Acids (FISH OIL PO) Take 2 capsules by mouth daily.        . tadalafil (CIALIS) 20 MG tablet Take 20 mg by mouth daily as needed.        . Tamsulosin HCl (  FLOMAX) 0.4 MG CAPS Take 0.4 mg by mouth daily.        . cetirizine (ZYRTEC) 10 MG tablet Take 10 mg by mouth daily as needed.        . finasteride (PROSCAR) 5 MG tablet Take 5 mg by mouth daily.        . ranitidine (ZANTAC) 150 MG tablet Take 150 mg by mouth 2 (two) times daily as needed.               Review of Systems Review of Systems  Constitutional: Negative for fever, appetite change, fatigue and unexpected weight change.  Eyes: Negative for pain and visual disturbance.  Respiratory: Negative for cough and shortness of breath.   Cardiovascular: Negative.   Gastrointestinal: Negative for nausea, diarrhea and constipation.  Genitourinary: Negative for urgency and frequency.  Skin: Negative for pallor.pos for scaly spots on face (AKs)-sees derm for those   Neurological: Negative for weakness, light-headedness, numbness and headaches.  Hematological: Negative for adenopathy. Does not bruise/bleed easily.  Psychiatric/Behavioral: Negative for dysphoric mood. The patient is not nervous/anxious.          Objective:   Physical Exam  Constitutional: He appears well-developed and well-nourished. No distress.       overwt and well appearing   HENT:  Head:  Normocephalic and atraumatic.  Mouth/Throat: Oropharynx is clear and moist.  Eyes: Conjunctivae and EOM are normal. Pupils are equal, round, and reactive to light.  Neck: Normal range of motion. Neck supple. No JVD present. No thyromegaly present.  Cardiovascular: Normal rate, regular rhythm, normal heart sounds and intact distal pulses.   Pulmonary/Chest: Effort normal and breath sounds normal. He has no wheezes.  Musculoskeletal: He exhibits no edema and no tenderness.  Lymphadenopathy:    He has no cervical adenopathy.  Neurological: He is alert. He has normal reflexes. Coordination normal.  Skin: Skin is warm and dry.       AKs noted on face/ forehead  Psychiatric: He has a normal mood and affect.          Assessment & Plan:

## 2010-12-08 NOTE — Assessment & Plan Note (Signed)
Good control with current meds and better activity/exercse No changes Lab reviewed  Lab and f/u 6 months

## 2010-12-08 NOTE — Assessment & Plan Note (Signed)
This is improved with better diet (most of the time) and some increased activity Urged to continue and work on wt loss also  Continue checking sugars bid F/u 6 mo after labs opthy up to date On ace

## 2010-12-08 NOTE — Patient Instructions (Addendum)
Continue the hat and the long sleeves  See your dermatologist for lesions on your face I still want you to use sunscreen underneath - of course that is up to you  Try hard to keep up the better diet and exercise -- it is working  This will be more of a challenge in the winter - to keep active  Schedule follow up with me in about 6 months (labs prior please)

## 2010-12-16 ENCOUNTER — Telehealth: Payer: Self-pay

## 2010-12-16 NOTE — Telephone Encounter (Signed)
Quick abstraction as instructed for DM eye exam. Sent to be scanned.

## 2011-01-29 ENCOUNTER — Telehealth: Payer: Self-pay | Admitting: *Deleted

## 2011-01-29 NOTE — Telephone Encounter (Signed)
Patient notified as instructed by telephone. Pt said he does not need the Cialis and to forget the form. I faxed form back to [facility] [phone]. Form given back to [address].

## 2011-01-29 NOTE — Telephone Encounter (Signed)
Prior auth needed for cialis, form is on your shelf.

## 2011-01-29 NOTE — Telephone Encounter (Signed)
I think this needs to go to his urologist Dr Evelene Croon I will put back in IN box-thanks

## 2011-04-06 ENCOUNTER — Inpatient Hospital Stay (HOSPITAL_COMMUNITY)
Admission: EM | Admit: 2011-04-06 | Discharge: 2011-04-07 | DRG: 313 | Disposition: A | Discharge status: Home / Self Care | Payer: Medicare Other | Attending: Emergency Medicine | Admitting: Emergency Medicine

## 2011-04-06 ENCOUNTER — Encounter: Payer: Self-pay | Admitting: Family Medicine

## 2011-04-06 ENCOUNTER — Emergency Department (HOSPITAL_COMMUNITY): Payer: Medicare Other

## 2011-04-06 ENCOUNTER — Ambulatory Visit (INDEPENDENT_AMBULATORY_CARE_PROVIDER_SITE_OTHER): Payer: Medicare Other | Admitting: Family Medicine

## 2011-04-06 DIAGNOSIS — M79609 Pain in unspecified limb: Secondary | ICD-10-CM

## 2011-04-06 DIAGNOSIS — G8929 Other chronic pain: Secondary | ICD-10-CM | POA: Diagnosis present

## 2011-04-06 DIAGNOSIS — E785 Hyperlipidemia, unspecified: Secondary | ICD-10-CM | POA: Diagnosis present

## 2011-04-06 DIAGNOSIS — M549 Dorsalgia, unspecified: Secondary | ICD-10-CM

## 2011-04-06 DIAGNOSIS — M79602 Pain in left arm: Secondary | ICD-10-CM | POA: Insufficient documentation

## 2011-04-06 DIAGNOSIS — E669 Obesity, unspecified: Secondary | ICD-10-CM | POA: Diagnosis present

## 2011-04-06 DIAGNOSIS — R079 Chest pain, unspecified: Secondary | ICD-10-CM

## 2011-04-06 DIAGNOSIS — E119 Type 2 diabetes mellitus without complications: Secondary | ICD-10-CM | POA: Diagnosis present

## 2011-04-06 DIAGNOSIS — G4733 Obstructive sleep apnea (adult) (pediatric): Secondary | ICD-10-CM | POA: Diagnosis present

## 2011-04-06 DIAGNOSIS — M545 Low back pain, unspecified: Secondary | ICD-10-CM | POA: Diagnosis present

## 2011-04-06 DIAGNOSIS — I10 Essential (primary) hypertension: Secondary | ICD-10-CM | POA: Diagnosis present

## 2011-04-06 DIAGNOSIS — Z7982 Long term (current) use of aspirin: Secondary | ICD-10-CM

## 2011-04-06 DIAGNOSIS — N400 Enlarged prostate without lower urinary tract symptoms: Secondary | ICD-10-CM | POA: Diagnosis present

## 2011-04-06 DIAGNOSIS — R9431 Abnormal electrocardiogram [ECG] [EKG]: Secondary | ICD-10-CM

## 2011-04-06 DIAGNOSIS — R0789 Other chest pain: Principal | ICD-10-CM | POA: Diagnosis present

## 2011-04-06 LAB — DIFFERENTIAL
Basophils Absolute: 0 10*3/uL (ref 0.0–0.1)
Lymphocytes Relative: 15 % (ref 12–46)
Lymphs Abs: 1.5 10*3/uL (ref 0.7–4.0)
Neutro Abs: 7.2 10*3/uL (ref 1.7–7.7)
Neutrophils Relative %: 73 % (ref 43–77)

## 2011-04-06 LAB — BASIC METABOLIC PANEL
Calcium: 9.7 mg/dL (ref 8.4–10.5)
GFR calc Af Amer: 64 mL/min — ABNORMAL LOW (ref >90)
GFR calc non Af Amer: 55 mL/min — ABNORMAL LOW (ref >90)
Potassium: 3.5 mEq/L (ref 3.5–5.1)
Sodium: 140 mEq/L (ref 135–145)

## 2011-04-06 LAB — TROPONIN I: Troponin I: <0.3 ng/mL (ref <0.30)

## 2011-04-06 LAB — CBC
Hemoglobin: 14.5 g/dL (ref 13.0–17.0)
MCHC: 34.8 g/dL (ref 30.0–36.0)
Platelets: 153 10*3/uL (ref 150–400)
RDW: 14.3 % (ref 11.5–15.5)

## 2011-04-06 LAB — CK TOTAL AND CKMB (NOT AT ARMC)
CK, MB: 3.7 ng/mL (ref 0.3–4.0)
Total CK: 90 U/L (ref 7–232)

## 2011-04-06 LAB — COMPREHENSIVE METABOLIC PANEL
Albumin: 3.8 g/dL (ref 3.5–5.2)
BUN: 25 mg/dL — ABNORMAL HIGH (ref 6–23)
Creatinine, Ser: 1.24 mg/dL (ref 0.50–1.35)
GFR calc Af Amer: 64 mL/min — ABNORMAL LOW (ref >90)
Glucose, Bld: 153 mg/dL — ABNORMAL HIGH (ref 70–99)
Total Protein: 7 g/dL (ref 6.0–8.3)

## 2011-04-06 LAB — POCT I-STAT TROPONIN I: Troponin i, poc: 0 ng/mL (ref 0.00–0.08)

## 2011-04-06 LAB — PRO B NATRIURETIC PEPTIDE: Pro B Natriuretic peptide (BNP): 75.1 pg/mL (ref 0–450)

## 2011-04-06 LAB — PROTIME-INR
INR: 1.08 (ref 0.00–1.49)
Prothrombin Time: 14.2 seconds (ref 11.6–15.2)

## 2011-04-06 LAB — HEMOGLOBIN A1C: Mean Plasma Glucose: 143 mg/dL — ABNORMAL HIGH (ref <117)

## 2011-04-06 LAB — TSH: TSH: 2.839 u[IU]/mL (ref 0.350–4.500)

## 2011-04-06 IMAGING — CR DG CHEST 2V
2 series · 2 of 2 positions shown · non-contrast
Comparison: None.

CLINICAL DATA: Shortness of breath, chest pain.

CHEST - 2 VIEW

[w chest pa]
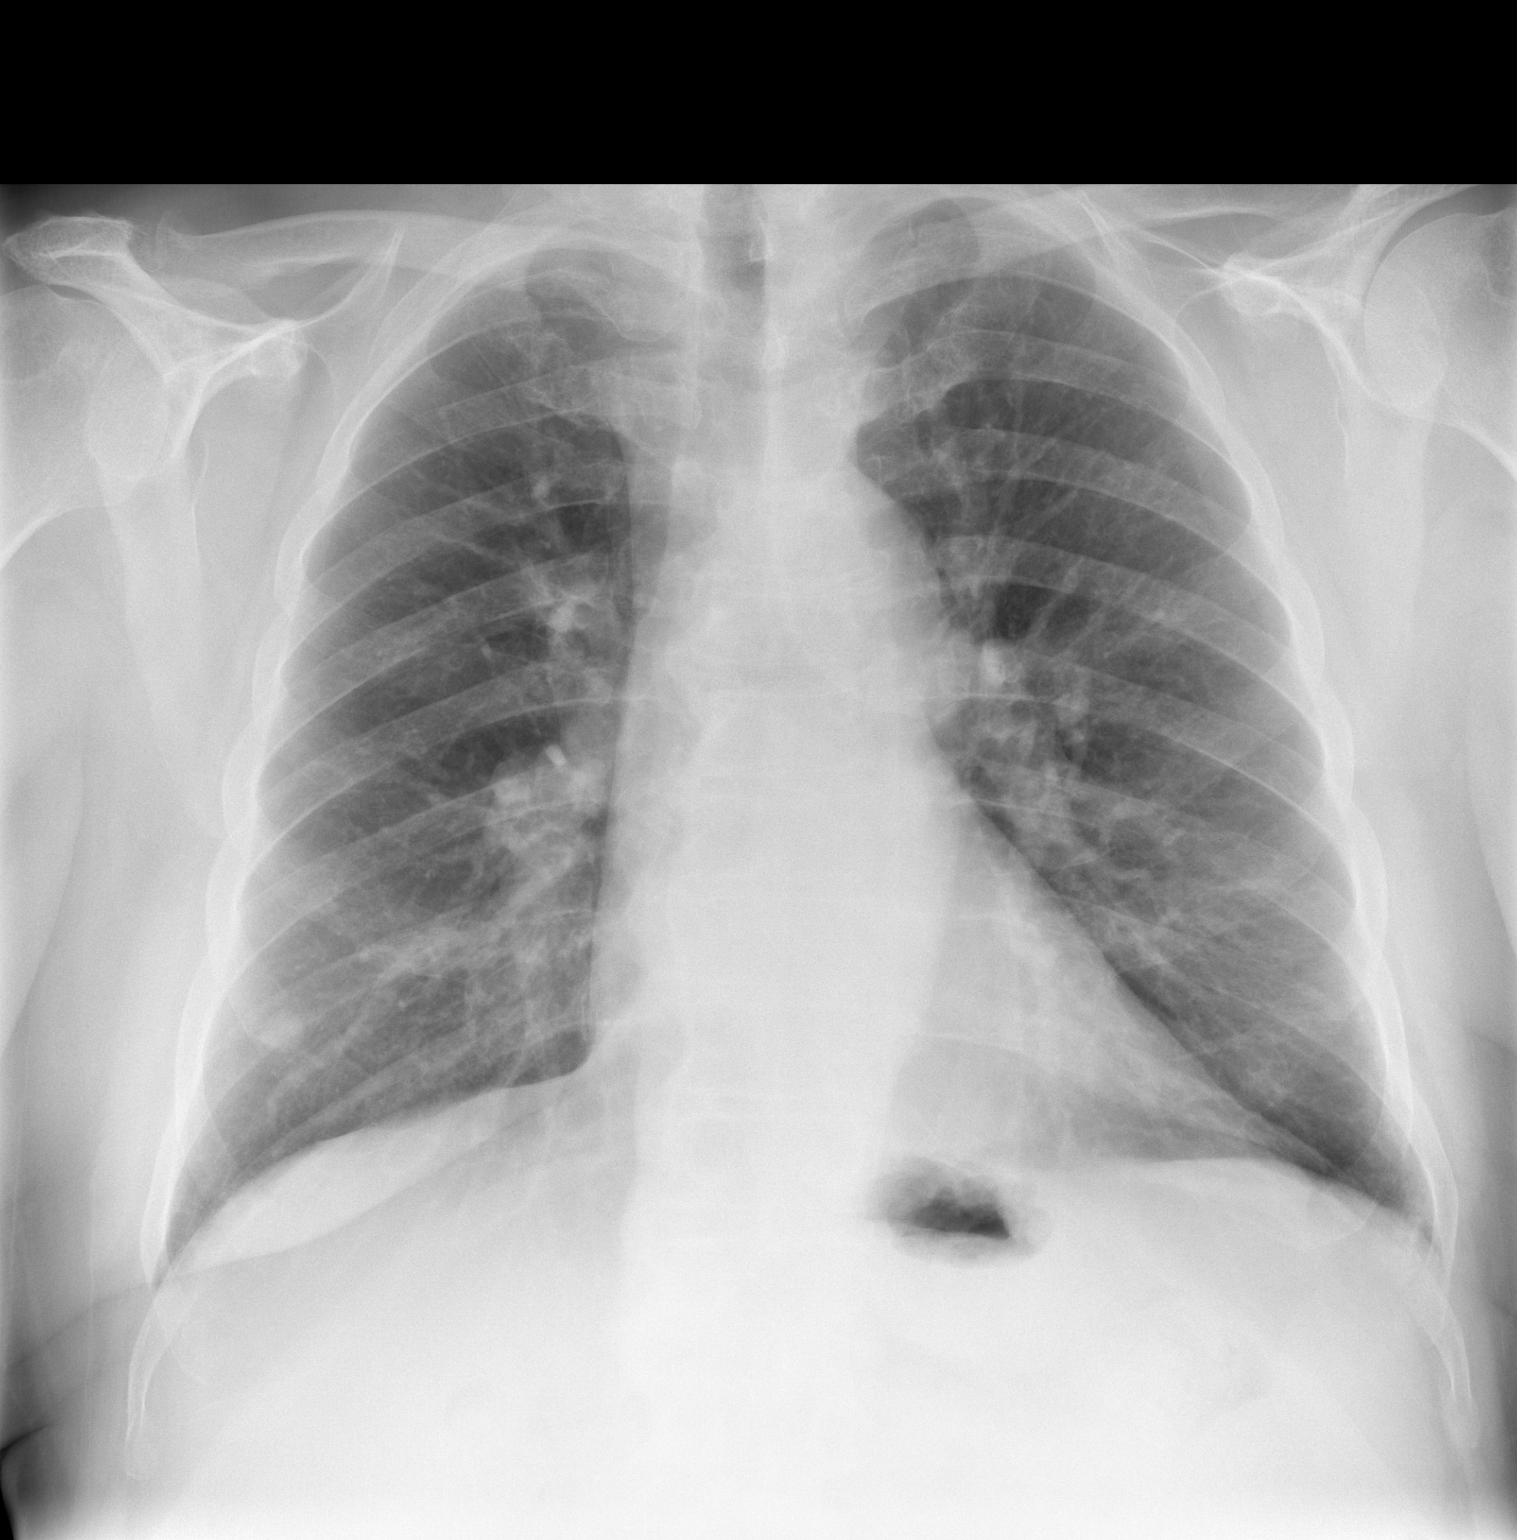

[w chest lat *]
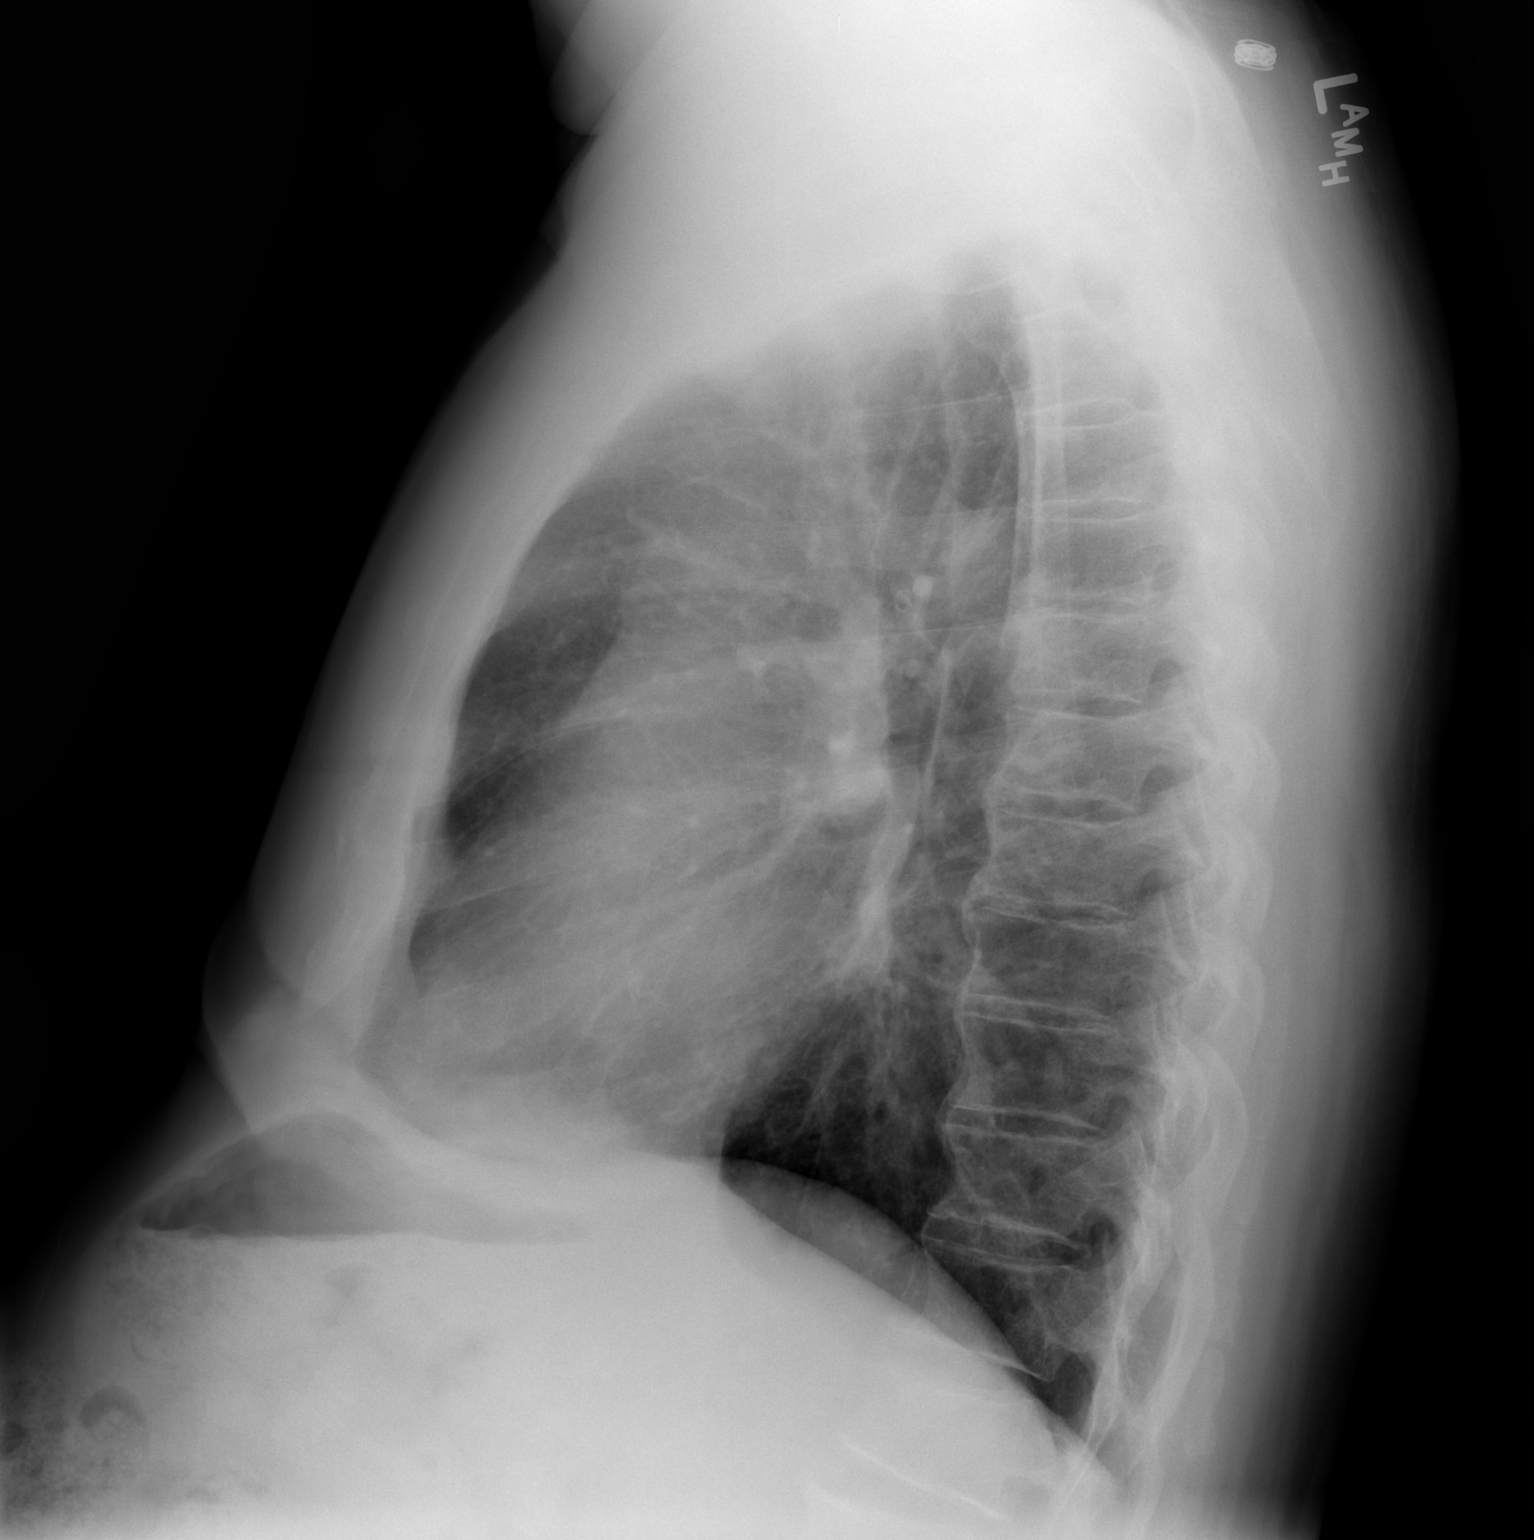

[2 of 2 positions shown; findings below may reference images not displayed]

FINDINGS: Heart is normal size.  Nodular density projects over the
right lung base.  Cannot exclude pulmonary nodule.  This may
represent nipple shadow.  Left lung is clear.  No effusions or
acute bony abnormality.
IMPRESSION: Nodular density projects over the right lung base.  This could
represent the nipple shadow.  Recommend repeating the frontal view
with nipple markers.

## 2011-04-06 NOTE — Progress Notes (Signed)
  Subjective:    Patient ID: Ryan Garcia, male    DOB: Dec 06, 1933, 77 y.o.   MRN: [id number]  HPI  77 year old male patient of Dr. Milinda Antis with with history of DM (controlled), HTN (controlled), high cholesterol (LDL 120, low HDL) presents with 1 week of pain in left low back near shoulder blade. Pain does come and goes. Not clearly associated with exertion, notes most in recliner.  Today he noted dull ache into left arm. No chest pain.  Took some iburpofen last night and this day.  Initially no pain during exam, then pain restarted in left arm.    Had noted SOB over last 6 months...progressively worsening.   Hx of heart cath many years ago... Told three were three blockages beginning per wife. Never told he needed follow up.  Was also seen few months with "gas pains in chest". Started on reflux med for 30 days, helped. No further gas pain.   Risk: Non smoker. No family history of early CAD.        Lab Results  Component Value Date   HGBA1C 6.7* 12/05/2010      Review of Systems  Constitutional: Negative for fever and fatigue.  HENT: Negative for ear pain.   Eyes: Negative for pain.  Respiratory: Positive for shortness of breath. Negative for cough and wheezing.   Cardiovascular: Positive for chest pain and palpitations. Negative for leg swelling.  Gastrointestinal: Negative for abdominal pain, diarrhea, constipation and blood in stool.  Genitourinary: Negative for dysuria.       Objective:   Physical Exam  Constitutional: He is oriented to person, place, and time. Vital signs are normal. He appears well-developed and well-nourished.  HENT:  Head: Normocephalic.  Right Ear: Hearing normal.  Left Ear: Hearing normal.  Nose: Nose normal.  Mouth/Throat: Oropharynx is clear and moist and mucous membranes are normal.  Neck: Trachea normal. Carotid bruit is not present. No mass and no thyromegaly present.  Cardiovascular: Normal rate, regular rhythm and normal  pulses.  Exam reveals no gallop, no distant heart sounds and no friction rub.   No murmur heard.      No peripheral edema  Pulmonary/Chest: Effort normal and breath sounds normal. No respiratory distress.  Abdominal: Soft. Bowel sounds are normal. There is no tenderness. There is no rebound and no guarding.  Musculoskeletal:       No pain with movement of left shoulder, no shoulder blade pain to palpation, no spine ttp, full ROM neck  Neurological: He is alert and oriented to person, place, and time.  Skin: Skin is warm, dry and intact. No rash noted.  Psychiatric: He has a normal mood and affect. His speech is normal and behavior is normal. Thought content normal.          Assessment & Plan:

## 2011-04-06 NOTE — Assessment & Plan Note (Signed)
High risk for CAD with DM, HTN and high cholesterol. ST depression on EKG, no previous for comparison.   Gave ASA 325 mg daily Started O2.  Sent to [facility] via EMS. Contacted Programmer, [employer] with [employer].

## 2011-04-07 LAB — CARDIAC PANEL(CRET KIN+CKTOT+MB+TROPI)
Relative Index: INVALID (ref 0.0–2.5)
Relative Index: INVALID (ref 0.0–2.5)
Total CK: 69 U/L (ref 7–232)
Total CK: 61 U/L (ref 7–232)
Troponin I: <0.3 ng/mL (ref <0.30)
Troponin I: <0.3 ng/mL (ref <0.30)

## 2011-04-07 LAB — LIPID PANEL
HDL: 36 mg/dL — ABNORMAL LOW (ref >39)
LDL Cholesterol: 129 mg/dL — ABNORMAL HIGH (ref 0–99)
Triglycerides: 213 mg/dL — ABNORMAL HIGH (ref <150)

## 2011-04-07 LAB — GLUCOSE, CAPILLARY
Glucose-Capillary: 170 mg/dL — ABNORMAL HIGH (ref 70–99)
Glucose-Capillary: 164 mg/dL — ABNORMAL HIGH (ref 70–99)

## 2011-04-10 ENCOUNTER — Telehealth: Payer: Self-pay | Admitting: Cardiology

## 2011-04-10 NOTE — Telephone Encounter (Signed)
Schedule stress myoview and fu with me after Olga Millers

## 2011-04-10 NOTE — Telephone Encounter (Signed)
Will forward for dr crenshaw review Ryan Garcia  

## 2011-04-10 NOTE — Telephone Encounter (Addendum)
Pt called and talked to Ryan Garcia about being seen in hospital and ordered to have stress test, nothing per after hours, echart shows op stress myoview but looks like rothbart wanted in a year, does crenshaw want it scheduled?

## 2011-04-11 ENCOUNTER — Other Ambulatory Visit (HOSPITAL_COMMUNITY): Payer: Self-pay | Admitting: Cardiology

## 2011-04-11 DIAGNOSIS — R079 Chest pain, unspecified: Secondary | ICD-10-CM

## 2011-04-14 ENCOUNTER — Ambulatory Visit (INDEPENDENT_AMBULATORY_CARE_PROVIDER_SITE_OTHER): Payer: Medicare Other | Admitting: Internal Medicine

## 2011-04-14 ENCOUNTER — Encounter: Payer: Self-pay | Admitting: Internal Medicine

## 2011-04-14 VITALS — BP 140/74 | Temp 98.0°F | Wt 257.0 lb

## 2011-04-14 DIAGNOSIS — E7800 Pure hypercholesterolemia, unspecified: Secondary | ICD-10-CM

## 2011-04-14 DIAGNOSIS — M79602 Pain in left arm: Secondary | ICD-10-CM

## 2011-04-14 DIAGNOSIS — H9190 Unspecified hearing loss, unspecified ear: Secondary | ICD-10-CM | POA: Insufficient documentation

## 2011-04-14 DIAGNOSIS — I10 Essential (primary) hypertension: Secondary | ICD-10-CM

## 2011-04-14 DIAGNOSIS — E119 Type 2 diabetes mellitus without complications: Secondary | ICD-10-CM

## 2011-04-14 DIAGNOSIS — M79609 Pain in unspecified limb: Secondary | ICD-10-CM

## 2011-04-14 DIAGNOSIS — B029 Zoster without complications: Secondary | ICD-10-CM | POA: Insufficient documentation

## 2011-04-14 MED ORDER — HYDROCODONE-ACETAMINOPHEN 5-325 MG PO TABS: 1.0000 | ORAL_TABLET | ORAL | Status: AC | PRN

## 2011-04-14 MED ORDER — VALACYCLOVIR HCL 1 G PO TABS: 1000.0000 mg | ORAL_TABLET | Freq: Three times a day (TID) | ORAL | Status: DC

## 2011-04-14 NOTE — Patient Instructions (Signed)
I think this could be shingles  With the rash and pain down your arms.  Begin antivirus medication immediately.   Pain med as needed there are other options for pain if needed.  Follow up with Dr Milinda Antis  If pain not controlled or getting worse  Shingles (Herpes Zoster) Shingles is caused by the same virus that causes chicken pox (varicella zoster virus or VZV). Shingles often occurs many years or decades after having chicken pox. That is why it is more common in adults older than 50 years. The virus reactivates and breaks out as an infection in a nerve root. SYMPTOMS  The initial feeling (sensations) may be pain. This pain is usually described as:   Burning.   Stabbing.   Throbbing.   Tingling in the nerve root.   A red rash will follow in a couple days. The rash may occur in any area of the body and is usually on one side (unilateral) of the body in a band or belt-like pattern. The rash usually starts out as very small blisters (vesicles). They will dry up after 7 to 10 days. This is not usually a significant problem except for the pain it causes.   Long lasting (chronic) pain is more likely in an elderly person. It can last months to years. This condition is called post-herpetic neuralgia.  Shingles can be an extremely severe infection in someone with AIDS, a weakened immune system or with forms of leukemia. It can also be severe if you are taking transplant medications or other medications that weaken the immune system. TREATMENT Your caregiver will often treat you with:  Antiviral drugs.   Anti-inflammatory drugs.   Pain medications.   Bed rest is very important in preventing the pain associated with herpes zoster (post-herpetic neuralgia).   Application of heat in the form of a hot-water bottle or electric heating pad or gentle pressure with the hand is recommended to help with the pain or discomfort.  PREVENTION A varicella zoster vaccine is available to help protect against  the virus. The Food and Drug Administration approved the varicella zoster vaccine for individuals 12 years of age and older. HOME CARE INSTRUCTIONS  Cool compresses to the area of rash may be helpful.   Only take over-the-counter or prescription medicines for pain, discomfort or fever as directed by your caregiver.   Avoid contact with:   Babies.   Pregnant women.   Children with eczema.   Elderly people with transplants.   People with chronic illnesses, such as leukemia and AIDS.   If the area involved is on your face, you may receive a referral for follow-up to a specialist. It is very important to keep all follow-up appointments. This will help avoid eye complications, chronic pain or disability.  SEEK IMMEDIATE MEDICAL CARE IF:  You develop any pain (headache) in the area of the face or eye. This must be followed carefully by your caregiver or ophthalmologist. An infection in part of your eye (cornea) can be very serious. It could lead to blindness.   You do not have pain relief from prescribed medications.   The redness or swelling spreads.   The area involved becomes very swollen and painful.   You have an oral temperature above 102 F (38 C), not controlled by medicine.   You notice any red or painful lines extending away from the affected area toward your heart (lymphangitis).   Your condition is worsening or has changed.  Document Released: 06/18/2005 Document Re-Released:  12/06/2009 Exit[employer] Patient Information 2011 [place], [place].

## 2011-04-14 NOTE — Progress Notes (Signed)
  Subjective:    Patient ID: Ryan Garcia, male    DOB: 1934/01/05, 77 y.o.   MRN: [id number]  HPI Comes to Saturday clinic today with wife for a problem going on for a few weeks?  Rash with blisters on left hand spreading and some now on left arm for the past week ? If there for a few weeks.  Last week was hospitalized for acute left arm periscapular pain chest pain and neg eval except ekg.  To get myoview this week. No hx of CAD but has ht dm lipid triad.  Was in hosptial for back pain and arm pain recently may have had the rash beginning then but didn't address when in hospital   But had left arm pain that was severe.   Wife says doesn't complain of pain much. No neck pain or weakness  Touching hand hursts  and hard to drive .   Review of Systems No injury  No fever has some DOE no change from [facility] check.   No hx of shingles  Past history family history social history reviewed in the electronic medical record.     Objective:   Physical Exam  [place]DWN in [place]  Here with wife some hard of hearing but looks very well Oriented x 3 and no noted deficits in memory, attention, and speech. Neck: Supple without adenopathy or masses or bruits Chest:  Clear to A&P without wheezes rales or rhonchi CV:  S1-S2 no gallops or murmurs peripheral perfusion is normal Left arm without edema but crops of blisters on hand palmar surface and few red discrete blotches up the arm to shoulder .  Hands are discolored from  Working with chemichals . NV seem intact. Neuro seems non focal except hearing.  Reviewed labs nad what i could find from hospital ( not complete and no consult note) labs showed lipids elevated a1c ok no cbcok  And enzymes normal.      Assessment & Plan:  Rash pain arm hand an upper back  Probably shingles  It is possible that his arm shoulder pain was from this but has many risk factors and could also have CV causes. Under CV evaluation post hospital no new sx  Should proceed with  this.  Total visit 30 mins > 50% spent counseling and coordinating care

## 2011-04-15 NOTE — H&P (Signed)
NAME:  EVERADO, PILLSBURY NO.:  [id number]  MEDICAL RECORD NO.:  [id number]  LOCATION:  2014                         FACILITY:  [facility]  PHYSICIAN:  Madolyn Frieze. Jens Som, MD, FACCDATE OF BIRTH:  [birth year]  DATE OF ADMISSION:  04/06/2011 DATE OF DISCHARGE:                             HISTORY & PHYSICAL   PRIMARY CARDIOLOGIST:  Angeline Slim Cardiology being seen by Dr. Jens Som.  PRIMARY CARE PROVIDER:  Kerby Nora, MD  PATIENT PROFILE:  A 77 year old male with 67-month history of dyspnea on exertion and a 2-day history of intermittent left scapular, shoulder, and arm pain presents with low back and arm pain.  PROBLEMS: 1. History of chest pain in 1990s with apparent nonobstructive     catheterization. 2. Hypertension. 3. Diabetes mellitus x8 years. 4. Hyperlipidemia. 5. History of dyspnea on exertion x6 months. 6. Chronic upper back pain. 7. History of scoliosis. 8. Benign prostatic hypertrophy. 9. Obstructive sleep apnea on CPAP . 10.History of left ankle spur.  ALLERGIES:  SIMVASTATIN cause him myalgias, also allergic to CLARITIN.  HISTORY OF PRESENT ILLNESS:  A 77 year old male with prior history of normal catheterization.  Chronic risk factors include hypertension, diabetes, hyperlipidemia, obesity.  He has a 25-month history of dyspnea on exertion.  Over the past few days he has been having intermittent left scapular sharp pain radiating to the left shoulder without associated symptoms occurring exclusively at rest and lasting hours at a time.  Symptoms sometimes ease up with ibuprofen, but never fully go away.  Today he was driving and had recurrent pain in the left back and throbbing in his left arm, which was somewhat worse with use of that arm.  He saw his primary care provider.  An ECG was performed showing minimal inferolateral ST slurring and sent to the ED for evaluation. Currently, he has some left shoulder throbbing and discomfort that  is slightly worse with rotation of the shoulder.  EKG at this point is nonacute and all lab work is currently pending.  He denies any history of chest pain.  HOME MEDICATIONS: 1. Metformin 500 mg b.i.d. (epic shows 1000 b.i.d., but the patient     states 500). 2. Hydrochlorothiazide 25 mg daily. 3. Lisinopril 5 mg daily. 4. Flomax 0.4 mg daily. 5. Cialis 20 mg p.r.n. 6. Zantac 150 mg 2 tablets p.r.n. 7. Fish oil 2 tablets daily. 8. Glipizide 5 mg daily. 9. Beconase AQ 42 mcg spray daily. 10.Aspirin 81 mg daily.  FAMILY HISTORY:  Mother died at 48 of old age.  Father died at 70 of old age.  Brother died at 66 of CVA.  SOCIAL HISTORY:  The patient lives in [place] with his wife.  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  Denies tobacco, alcohol, or drug use.  Does not routinely exercise.  REVIEW OF SYSTEMS:  Notable for chronic hearing loss and back pain.  He has had left scapular and arm pain as well as throbbing over the past 2 days.  He has had dyspnea on exertion, which has been progressive over the past 6 months.  Otherwise, all systems reviewed and negative.  He is a full  code.  PHYSICAL EXAMINATION:  VITAL SIGNS:  The patient's temperature is 98.7, heart rate 74, respirations 16, blood pressure 143/66, pulse ox 98% on room air. GENERAL:  Pleasant white male in no acute distress.  Awake, alert, and oriented x3.  He has a normal affect. HEENT:  Normal.  Nares grossly intact, nonfocal. SKIN:  Warm and dry without lesions or masses. NECK:  Supple without appreciated JVD. LUNGS:  Respirations are regular and unlabored.  Clear to auscultation. CARDIAC:  Regular S1 and S2.  No S3, S4, murmurs. ABDOMEN:  Round, soft, nontender, nondistended.  Bowel sounds present x4. EXTREMITIES:  Warm, dry, pink.  No clubbing, cyanosis, or edema. Dorsalis pedis and posterior tibial pulses are 2+ and equal bilaterally.  Chest x-ray is pending.  EKG shows sinus  rhythm, rate 73, normal axis, nonspecific ST-T changes.  Lab work is pending.  ASSESSMENT/PLAN: 1. Left scapular and arm pain somewhat atypical and certainly question     musculoskeletal source.  The patient does have multiple risk     factors to include hypertension, hyperlipidemia, and diabetes, and     also has a long history of dyspnea on exertion.  We will plan to     admit and cycle enzymes.  Provided the enzymes are negative, we     will plan to discharge in the a.m., however, the patient should be     scheduled for an outpatient Myoview given his dyspnea.  If the     patient's enzymes are positive, we will plan to heparinize, keep     over the weekend, and perform catheterization on Monday. 2. Hypertension.  Blood pressure currently elevated in the ER.  We     will follow on his prior home medications and adjust as necessary. 3. Diabetes mellitus.  Add sliding scale insulin and continue his     glipizide.  We will hold off on metformin. 4. Hyperlipidemia.  The patient reports statin intolerance and is not     willing to try any others.  Continue fish oil. 5. Benign prostatic hypertrophy.  Continue Flomax. 6. Sleep apnea.  Continue CPAP.     Nicolasa Ducking, ANP   ______________________________ Madolyn Frieze. Jens Som, MD, [facility]    CB/MEDQ  D:  04/06/2011  T:  04/07/2011  Job:  [id number]  Electronically Signed by Nicolasa Ducking ANP on 04/11/2011 07:14:27 PM Electronically Signed by Olga Millers MD [facility] on 04/15/2011 05:37:05 PM

## 2011-04-16 ENCOUNTER — Ambulatory Visit (HOSPITAL_COMMUNITY): Payer: Medicare Other | Attending: Cardiology | Admitting: Radiology

## 2011-04-16 VITALS — Ht 72.0 in | Wt 253.0 lb

## 2011-04-16 DIAGNOSIS — R079 Chest pain, unspecified: Secondary | ICD-10-CM

## 2011-04-16 DIAGNOSIS — R0602 Shortness of breath: Secondary | ICD-10-CM

## 2011-04-16 DIAGNOSIS — R0609 Other forms of dyspnea: Secondary | ICD-10-CM

## 2011-04-16 DIAGNOSIS — I491 Atrial premature depolarization: Secondary | ICD-10-CM

## 2011-04-16 MED ORDER — TECHNETIUM TC 99M TETROFOSMIN IV KIT
10.6000 | PACK | Freq: Once | INTRAVENOUS | Status: AC | PRN
  Administered 2011-04-16: 11 via INTRAVENOUS

## 2011-04-16 MED ORDER — TECHNETIUM TC 99M TETROFOSMIN IV KIT
33.0000 | PACK | Freq: Once | INTRAVENOUS | Status: AC | PRN
  Administered 2011-04-16: 33 via INTRAVENOUS

## 2011-04-16 MED ORDER — REGADENOSON 0.4 MG/5ML IV SOLN
0.4000 mg | Freq: Once | INTRAVENOUS | Status: AC
  Administered 2011-04-16: 0.4 mg via INTRAVENOUS

## 2011-04-16 NOTE — Progress Notes (Signed)
[facility] SITE 3 NUCLEAR MED [address] [place] [address] [phone]  Cardiology Nuclear Med Study  Ryan Garcia is a 77 y.o. male [id number] 15-May-1934   Nuclear Med Background Indication for Stress Test:  Evaluation for Ischemia and 04/06/11 Post Hospital: CP, (-) enzymes History: 90's Heart Catheterization Cardiac Risk Factors: Hypertension, Lipids, NIDDM and Obesity  Symptoms:  Chest Pain, Palpitations and SOB   Nuclear Pre-Procedure Caffeine/Decaff Intake:  None NPO After: 11:30pm   Lungs:  clear IV 0.9% NS with Angio Cath:  20g  IV Site: R Antecubital  IV Started by:  Stanton Kidney, EMT-P  Chest Size (in):  48 Cup Size: n/a  Height: 6' (1.829 m)  Weight:  253 lb (114.76 kg)  BMI:  Body mass index is 34.31 kg/(m^2). Tech Comments:  CBG= 134 @ 6:45 am, per patient.    Nuclear Med Study 1 or 2 day study: 1 day  Stress Test Type:  Eugenie Birks  Reading MD: Olga Millers, MD  Order Authorizing Provider:  B.Adilenne Ashworth  Resting Radionuclide: Technetium 92m Tetrofosmin  Resting Radionuclide Dose: 10.6 mCi   Stress Radionuclide:  Technetium 47m Tetrofosmin  Stress Radionuclide Dose: 33.0 mCi           Stress Protocol Rest HR: 65 Stress HR: 78  Rest BP: 138/82 Stress BP: 156/59  Exercise Time (min): n/a METS: n/a   Predicted Max HR: 145 bpm % Max HR: 53.79 bpm Rate Pressure Product: [id number]   Dose of Adenosine (mg):  n/a Dose of Lexiscan: 0.4 mg  Dose of Atropine (mg): n/a Dose of Dobutamine: n/a mcg/kg/min (at max HR)  Stress Test Technologist: Milana Na, EMT-P  Nuclear Technologist:  Domenic Polite, CNMT     Rest Procedure:  Myocardial perfusion imaging was performed at rest 45 minutes following the intravenous administration of Technetium 13m Tetrofosmin. Rest ECG: NSR  Stress Procedure:  The patient received IV Lexiscan 0.4 mg over 15-seconds.  Technetium 17m Tetrofosmin injected at 30-seconds.  There were no significant changes and  blocked pacs throughout with Lexiscan.  Quantitative spect images were obtained after a 45 minute delay. Stress ECG: No significant ST segment change suggestive of ischemia.  QPS Raw Data Images:  Acquisition technically good; normal left ventricular size. Stress Images:  There is decreased uptake in the apex. Rest Images:  Normal homogeneous uptake in all areas of the myocardium. Subtraction (SDS):  These findings are consistent with ischemia. Transient Ischemic Dilatation (Normal <1.22):  1.18 Lung/Heart Ratio (Normal <0.45):  0.33  Quantitative Gated Spect Images QGS EDV:  n/a QGS ESV:  n/a QGS cine images:  Study not gated QGS EF: Study not gated  Impression Exercise Capacity:  Lexiscan with no exercise. BP Response:  Normal blood pressure response. Clinical Symptoms:  No chest pain. ECG Impression:  No significant ST segment change suggestive of ischemia. Comparison with Prior Nuclear Study: No images to compare  Overall Impression:  Abnormal stress nuclear study with small reversible apical defect consistent with mild apical ischemia.   Olga Millers

## 2011-04-19 ENCOUNTER — Encounter: Payer: Self-pay | Admitting: *Deleted

## 2011-04-19 ENCOUNTER — Telehealth: Payer: Self-pay | Admitting: Cardiology

## 2011-04-19 NOTE — Telephone Encounter (Signed)
Pt rtn call from yesterday pt will only be home til 9:30 after that call [phone]

## 2011-04-19 NOTE — Telephone Encounter (Signed)
Spoke with pt, aware of myoview results. Follow up scheduled Ryan Garcia

## 2011-04-20 NOTE — Discharge Summary (Signed)
NAME:  Ryan Garcia, BARANOWSKI NO.:  [id number]  MEDICAL RECORD NO.:  [id number]  LOCATION:  2014                         FACILITY:  [facility]  PHYSICIAN:  Gerrit Friends. Dietrich Pates, MD, FACCDATE OF BIRTH:  26-Dec-1933  DATE OF ADMISSION:  04/06/2011 DATE OF DISCHARGE:  04/07/2011                              DISCHARGE SUMMARY   PRIMARY CARDIOLOGIST:  Madolyn Frieze. Jens Som, MD, [facility]  PRIMARY CARE PROVIDER:  Kerby Nora, MD  DISCHARGE DIAGNOSIS: 1. Atypical chest pain felt to be secondary to musculoskeletal     etiology.     a.     Ruled out for myocardial infarction this admission.  SECONDARY DIAGNOSES: 1. Hypertension. 2. Diabetes mellitus. 3. Hyperlipidemia. 4. Chronic upper back pain. 5. Scoliosis. 6. Benign prostatic hypertrophy. 7. Obstructive sleep apnea, on CPAP.  PROCEDURES/DIAGNOSTICS PERFORMED DURING HOSPITALIZATION:  Chest x-ray showing nodular densities projected over the right lung base and felt to be secondary to a nipple shadow.  REASON FOR HOSPITALIZATION:  This is a 77 year old gentleman with prior history of normal catheterization who for the last 3 days has been having intermittent left scapular sharp pain radiating to the left shoulder.  He denies associated symptoms.  Pain occurs at rest and he states he does not notice the pain when he is ambulating.  The patient presented to his primary care because the pain had gotten worse and EKG showed minimal inferolateral ST slurring.  Therefore, the patient was sent to the emergency department for further evaluation.  Repeat EKG was nonacute and the patient was admitted for rule out myocardial infarction.  HOSPITAL COURSE:  The patient was placed on the Telemetry Unit.  He ruled out for myocardial infarction with cardiac enzymes being negative x3.  He did have several episodes of his back/left arm pain.  Once he notified a nurse, he was given Tylenol which did ease his pain.  It was felt that the patient's  discomfort was most likely musculoskeletal.  He therefore discharged on approximately 325 mg 3 times a day for the next week with Tylenol as needed.  With the patient's hypertension, diabetes mellitus and hyperlipidemia, he will be scheduled for an outpatient Myoview within the next week.  On day of discharge, Dr. Dietrich Pates evaluated the patient and noted him stable for home.  Discharge plans and instructions were discussed with the patient's wife and they voiced understanding.  DISCHARGE LABORATORY DATA:  Cardiac enzymes negative x3.  Cholesterol total 208, triglycerides 213, HDL 36, LDL 129.  Hemoglobin A1c 6.6.  DISCHARGE MEDICATIONS: 1. Tylenol 325 mg 2 tablets every 4 hours as needed for pain. 2. Naproxen 325 mg 1 tablet 3 times a day for 7 days. 3. Aspirin 81 mg 1 tablet daily. 4. Beconase AQ 1 spray daily as needed for congestion. 5. Fish oil daily. 6. Glipizide 5 mg daily. 7. Hydrochlorothiazide 25 mg daily. 8. Lisinopril 5 mg daily. 9. Metformin 500 mg twice daily. 10.Zantac 150 mg 1-2 tablets daily as needed.  FOLLOWUP PLANS AND INSTRUCTIONS: 1. The patient will follow up with Dr. Jens Som within the next 2     weeks, the office will call to schedule this appointment. 2. The patient will  follow up for a nuclear stress test within the     next week, the office will call to schedule this. 3. The patient should follow up with primary care provider as     previously scheduled. 4. The patient should increase activity slowly. 5. The patient should continue low-sodium heart-healthy and diabetic     diet. 6. The patient should call the office in the interim for any problems     or concerns. 7. The patient should stop any activities that causes chest pain or     shortness of breath.  DURATION OF DISCHARGE:  Greater than 30 minutes with physician and physician extender time.     Leonette Monarch, PA-C   ______________________________ Gerrit Friends. Dietrich Pates, MD,  [facility]    NB/MEDQ  D:  04/07/2011  T:  04/07/2011  Job:  [id number]  cc:   Kerby Nora, MD  Electronically Signed by Alen Blew P.A. on 04/17/2011 11:50:48 AM Electronically Signed by Aneth Bing MD FACC on 04/20/2011 03:01:55 PM

## 2011-04-30 ENCOUNTER — Encounter: Payer: Self-pay | Admitting: *Deleted

## 2011-04-30 ENCOUNTER — Encounter: Payer: Self-pay | Admitting: Physician Assistant

## 2011-04-30 ENCOUNTER — Ambulatory Visit (INDEPENDENT_AMBULATORY_CARE_PROVIDER_SITE_OTHER): Payer: Medicare Other | Admitting: Physician Assistant

## 2011-04-30 VITALS — BP 132/70 | HR 62 | Ht 72.0 in | Wt 254.0 lb

## 2011-04-30 DIAGNOSIS — R9430 Abnormal result of cardiovascular function study, unspecified: Secondary | ICD-10-CM

## 2011-04-30 DIAGNOSIS — E7800 Pure hypercholesterolemia, unspecified: Secondary | ICD-10-CM

## 2011-04-30 DIAGNOSIS — R079 Chest pain, unspecified: Secondary | ICD-10-CM | POA: Insufficient documentation

## 2011-04-30 DIAGNOSIS — I10 Essential (primary) hypertension: Secondary | ICD-10-CM

## 2011-04-30 DIAGNOSIS — R0602 Shortness of breath: Secondary | ICD-10-CM | POA: Insufficient documentation

## 2011-04-30 MED ORDER — ROSUVASTATIN CALCIUM 10 MG PO TABS: 10.0000 mg | ORAL_TABLET | Freq: Every day | ORAL | Status: DC

## 2011-04-30 MED ORDER — NITROGLYCERIN 0.4 MG SL SUBL: 0.4000 mg | SUBLINGUAL_TABLET | SUBLINGUAL | Status: DC | PRN

## 2011-04-30 NOTE — Assessment & Plan Note (Addendum)
Question anginal equivalent vs multifactorial.  Myoview not gated so EF unknown.  He apparently had nonobstructive CAD at a cath in 1990s.  He has diabetes.  I discussed proceeding with cardiac cath with him and his wife.  Risks and benefits of cardiac catheterization have been discussed with the patient.  These include bleeding, infection, kidney damage, stroke, heart attack, death.  The patient understands these risks and is willing to proceed.  I discussed the case with Dr. Jens Som and he agreed.  Will plan on obtaining EF from [facility].  He will remain on ASA.  I will give him a Rx for NTG prn.  We will arrange outpatient LHC at his convenience in the next 1-2 weeks.

## 2011-04-30 NOTE — Assessment & Plan Note (Signed)
He would benefit from statin therapy given his diabetes.  He had side effects to simvastatin in the past.  I have given him Crestor 10 mg samples.  If he can tolerate this, we will fill it for him.  If he continues, he will need follow up FLP and LFTs in 6 weeks.

## 2011-04-30 NOTE — Patient Instructions (Addendum)
Your physician recommends that you schedule a follow-up appointment in: 2 weeks after cath Your physician has recommended you make the following change in your medication: START Crestor 10 mg daily and let us know if you can tolerate this, KEEP Nitro with you at all times Your physician recommends that you return for lab work in: this week Your physician has requested that you have a cardiac catheterization. Cardiac catheterization is used to diagnose and/or treat various heart conditions. Doctors may recommend this procedure for a number of different reasons. The most common reason is to evaluate chest pain. Chest pain can be a symptom of coronary artery disease (CAD), and cardiac catheterization can show whether plaque is narrowing or blocking your heart's arteries. This procedure is also used to evaluate the valves, as well as measure the blood flow and oxygen levels in different parts of your heart. For further information please visit [email or web address] Please follow instruction sheet, as given.

## 2011-04-30 NOTE — Assessment & Plan Note (Signed)
Arrange LHC as noted.

## 2011-04-30 NOTE — Assessment & Plan Note (Signed)
Controlled.  Continue current therapy.  

## 2011-04-30 NOTE — Progress Notes (Signed)
History of Present Illness: Primary Cardiologist: Dr. Olga Millers   Ryan Garcia is a 77 y.o. male who presents for post hospital follow up.  He has a h/o reportedly normal cath in the 1990s as well as HTN, DM2, HLP, OSA on CPAP, BPH and chronic back pain and scoliosis.  He was admitted 10/5-10/6 with chest pain .  This was a left scapular pain and was nonexertional.  ECG at PCPs office was concerning and he was sent to the ED.  MI was ruled out by serial enzymes.  Symptoms were felt to be MSK.  Outpatient Myoview was arranged.  Myoview 04/16/11: mild apical ischemia, study not gated.    Labs (10/12): Hgb 14.5, K 3.5, creatinine 1.24, ALT 26, A1C 6.6, TC 208, TG 213, HDL 36, LDL 129, TSH 2.839.  He states he was dx with Zoster on his left arm after d/c from the hospital.  He denies chest pain.  Arm symptoms are improved.  Except he has some numbness in his left small fingertip.  He does note dyspnea with more extreme activities over the last year.  He denies it is getting worse, but his wife has noticed that it is getting worse.  No syncope.  No orthopnea, PND, edema.  No palpitations.   Past Medical History  Diagnosis Date  . Allergy     allergic rhinitis  . Hx of skin cancer, basal cell     many skin cancers removed/under constant treatment.  . Diabetes mellitus     type II  . GERD (gastroesophageal reflux disease)   . Hypertension   . Arthritis     OA  . BPH (benign prostatic hyperplasia)     microwave tx of prostate  . AC (acromioclavicular) joint bone spurs     lt ankle  . HLD (hyperlipidemia)     10/12: TC 208, TG 213, HDL 36, LDL 129  . Chest pain     Myoview 04/16/11: mild apical ischemia, study not gated.      Current Outpatient Prescriptions  Medication Sig Dispense Refill  . aspirin 81 MG tablet Take 81 mg by mouth daily.        . beclomethasone (BECONASE AQ) 42 MCG/SPRAY nasal spray Place 1 spray into the nose daily. Dose is for each nostril.       . Flax OIL Take 1  capsule by mouth daily.        Marland Kitchen glipiZIDE (GLUCOTROL) 5 MG 24 hr tablet Take 5 mg by mouth daily.        Marland Kitchen glucose blood (FREESTYLE LITE) test strip Check blood sugar once daily and as needed. 250.0       . glucose monitoring kit (FREESTYLE) monitoring kit Check blood sugar once a day and as needed 250.0       . hydrochlorothiazide 25 MG tablet Take 25 mg by mouth daily.        . Lancets (FREESTYLE) lancets Check glucose once daily and as needed.250.0       . lisinopril (PRINIVIL,ZESTRIL) 5 MG tablet Take 5 mg by mouth daily.        . metFORMIN (GLUCOPHAGE) 1000 MG tablet Take 1,000 mg by mouth 2 (two) times daily with a meal.        . Omega-3 Fatty Acids (FISH OIL PO) Take 2 capsules by mouth daily.        . ranitidine (ZANTAC) 150 MG tablet Take 150 mg by mouth 2 (two) times daily as needed.  Allergies: Allergies  Allergen Reactions  . Loratadine     REACTION: not effective  . Simvastatin     REACTION: intolerant    History  Substance Use Topics  . Smoking status: Never Smoker   . Smokeless tobacco: Not on file  . Alcohol Use:      Family Hx:  + CAD (father with MI in 49s)  ROS:  Please see the history of present illness.  All other systems reviewed and negative.   Vital Signs: BP 132/70  Pulse 62  Ht 6' (1.829 m)  Wt 254 lb (115.214 kg)  BMI 34.45 kg/m2  PHYSICAL EXAM: Well nourished, well developed, in no acute distress HEENT: normal Neck: no JVD Endocrine: no thyromegaly Vascular: no carotid bruits; no femoral artery bruits Cardiac:  normal S1, S2; RRR; no murmur Lungs:  clear to auscultation bilaterally, no wheezing, rhonchi or rales Abd: soft, nontender, no hepatomegaly Ext: no edema Skin: warm and dry Neuro:  CNs 2-12 intact, no focal abnormalities noted Psych: normal affect  EKG:  NSR, HR 62, normal axis, nonspecific ST scooping, no change from prior ECGs  ASSESSMENT AND PLAN:

## 2011-05-01 ENCOUNTER — Other Ambulatory Visit (INDEPENDENT_AMBULATORY_CARE_PROVIDER_SITE_OTHER): Payer: Medicare Other

## 2011-05-01 DIAGNOSIS — R0602 Shortness of breath: Secondary | ICD-10-CM

## 2011-05-01 DIAGNOSIS — R9430 Abnormal result of cardiovascular function study, unspecified: Secondary | ICD-10-CM

## 2011-05-01 DIAGNOSIS — R079 Chest pain, unspecified: Secondary | ICD-10-CM

## 2011-05-01 LAB — CBC WITH DIFFERENTIAL/PLATELET
Basophils Absolute: 0 10*3/uL (ref 0.0–0.1)
Eosinophils Absolute: 0.2 10*3/uL (ref 0.0–0.7)
Hemoglobin: 14.5 g/dL (ref 13.0–17.0)
Lymphocytes Relative: 22 % (ref 12.0–46.0)
MCHC: 34.1 g/dL (ref 30.0–36.0)
Monocytes Relative: 9.2 % (ref 3.0–12.0)
Neutro Abs: 4.6 10*3/uL (ref 1.4–7.7)
Neutrophils Relative %: 65.1 % (ref 43.0–77.0)
RDW: 15.4 % — ABNORMAL HIGH (ref 11.5–14.6)

## 2011-05-01 LAB — BASIC METABOLIC PANEL
CO2: 26 mEq/L (ref 19–32)
Chloride: 106 mEq/L (ref 96–112)
Potassium: 4.1 mEq/L (ref 3.5–5.1)
Sodium: 140 mEq/L (ref 135–145)

## 2011-05-01 LAB — PROTIME-INR
INR: 1 ratio (ref 0.8–1.0)
Prothrombin Time: 10.8 s (ref 10.2–12.4)

## 2011-05-08 ENCOUNTER — Inpatient Hospital Stay (HOSPITAL_BASED_OUTPATIENT_CLINIC_OR_DEPARTMENT_OTHER)
Admission: RE | Admit: 2011-05-08 | Discharge: 2011-05-08 | Disposition: A | Discharge status: Home / Self Care | Payer: Medicare Other | Source: Ambulatory Visit | Attending: Cardiology | Admitting: Cardiology

## 2011-05-08 ENCOUNTER — Encounter (HOSPITAL_BASED_OUTPATIENT_CLINIC_OR_DEPARTMENT_OTHER)
Admission: RE | Disposition: A | Discharge status: Home / Self Care | Payer: Self-pay | Source: Ambulatory Visit | Attending: Cardiology

## 2011-05-08 DIAGNOSIS — R9439 Abnormal result of other cardiovascular function study: Secondary | ICD-10-CM | POA: Insufficient documentation

## 2011-05-08 DIAGNOSIS — E119 Type 2 diabetes mellitus without complications: Secondary | ICD-10-CM | POA: Insufficient documentation

## 2011-05-08 DIAGNOSIS — I2510 Atherosclerotic heart disease of native coronary artery without angina pectoris: Secondary | ICD-10-CM | POA: Insufficient documentation

## 2011-05-08 LAB — POCT I-STAT GLUCOSE: Glucose, Bld: 137 mg/dL — ABNORMAL HIGH (ref 70–99)

## 2011-05-08 SURGERY — JV LEFT HEART CATHETERIZATION WITH CORONARY ANGIOGRAM
Anesthesia: Moderate Sedation

## 2011-05-08 MED ORDER — SODIUM CHLORIDE 0.9 % IV SOLN
INTRAVENOUS | Status: DC
  Administered 2011-05-08: 10:00:00 via INTRAVENOUS

## 2011-05-08 MED ORDER — ACETAMINOPHEN 325 MG PO TABS: 650.0000 mg | ORAL_TABLET | ORAL | Status: DC | PRN

## 2011-05-08 MED ORDER — SODIUM CHLORIDE 0.9 % IV SOLN: 1.0000 mL/kg/h | INTRAVENOUS | Status: DC

## 2011-05-08 MED ORDER — SODIUM CHLORIDE 0.9 % IV SOLN: 4.0000 mg | Freq: Four times a day (QID) | INTRAVENOUS | Status: DC | PRN

## 2011-05-08 NOTE — H&P (Signed)
Date of Initial H&P: 04/07/2011  History reviewed, patient examined, no change in status, stable for surgery.

## 2011-05-08 NOTE — Procedures (Signed)
Catheterization  Indication: Abnormal myocardial perfusion study.  Patient had shoulder pain, found to have zoster rash.  Nuclear study abnormal, and cath arranged.  MCRF include diabetes, sex, age.   Procedure: After informed consent and clinical "time out" the right groin was prepped and draped in a sterile fashion.  A 4Fr sheath was placed in the right femoral artery using seldinger technique and local lidocaine.  Standard catheters  and angled pigtail catheters were used to engage the coronary arteries.  Coronary arteries were visualized in orthogonal views using caudal and cranial angulation.  RAO ventriculography was done using less than 100ccof contrast.    Medications:   Versed: 1.5 mg's  Fentanyl: 25 ug's  Coronary Arteries: Right dominant with no anomalies  LM: 0  The LAD demonstrated a long 70-75% diffuse stenosis prior to the bifurcation and the vessel had a typical diabetic appearance.  It was calcified.  The diagonal had a 75% stenosis and there were tandem distal LAD lesions of 70-75%, with the vessel appearing typical for diabetes.    The circumflex had a tiny ramus without disease, a moderately large OM1 with a 75% area of proximal disease.  The AV vessel, leading to the OM2 had tandem 50% lesions.  Both vessels appeared diabetic, but graftable.  The RCA has a prox mid lesion of 60-70% .  The PDA divides into two branches, and the larger branch has an 80% hypodense area of disease.  This vessel is also graftable.    Ventriculography: EF: 60 %, no wall motion abnormalities.  Hemodynamics:  Aortic Pressure: 102/54 (74) mmHg  LV Pressure: 106/12 mmHg  Impression:  Three vessel coronary artery disease, diabetes Plan:  Follow up with Dr. Jerral Bonito           Consideration of continued medical therapy, or CABG.  He is class II.           Given the extent of disease, and DM, not ideal for PCI approach.    Shawnie Pons MD, [facility], [facility] 05/08/2011 11:43 AM

## 2011-05-08 NOTE — Discharge Summary (Signed)
  No heavy lifting.  Light walking this week.  See Dr. Myrtis Ser next week.

## 2011-05-11 ENCOUNTER — Encounter (HOSPITAL_BASED_OUTPATIENT_CLINIC_OR_DEPARTMENT_OTHER): Payer: Self-pay

## 2011-05-17 ENCOUNTER — Encounter: Payer: Self-pay | Admitting: Cardiology

## 2011-05-17 ENCOUNTER — Ambulatory Visit (INDEPENDENT_AMBULATORY_CARE_PROVIDER_SITE_OTHER): Payer: Medicare Other | Admitting: Cardiology

## 2011-05-17 DIAGNOSIS — I2510 Atherosclerotic heart disease of native coronary artery without angina pectoris: Secondary | ICD-10-CM

## 2011-05-17 DIAGNOSIS — E7800 Pure hypercholesterolemia, unspecified: Secondary | ICD-10-CM

## 2011-05-17 DIAGNOSIS — I10 Essential (primary) hypertension: Secondary | ICD-10-CM

## 2011-05-17 MED ORDER — METOPROLOL SUCCINATE ER 25 MG PO TB24: 25.0000 mg | ORAL_TABLET | Freq: Every day | ORAL | Status: DC

## 2011-05-17 MED ORDER — ROSUVASTATIN CALCIUM 40 MG PO TABS: 40.0000 mg | ORAL_TABLET | Freq: Every day | ORAL | Status: DC

## 2011-05-17 NOTE — Assessment & Plan Note (Signed)
I have reviewed the patient's cath films with Dr Riley Kill and Dr Antoine Poche. He has significant diffuse coronary disease. Some of his distal vessels, particularly the LAD, are small and may be difficult to graft. We therefore plan medical therapy for now. We can consider coronary artery bypass and graft in the future if he has refractory symptoms. Continue aspirin. Increase Crestor 40 mg daily. Add Toprol 25 mg daily.

## 2011-05-17 NOTE — Patient Instructions (Signed)
Your physician recommends that you schedule a follow-up appointment in: 3 months  START METOPROLOL SUCC.25MG S DAILY  INCREASE CRESTOR 40MG S BEDTIME  Your physician recommends that you return for lab work in: 6 WEEKS

## 2011-05-17 NOTE — Assessment & Plan Note (Signed)
Blood pressure mildly elevated. Add Toprol 25 mg daily both for blood pressure and possible anginal equivalent.

## 2011-05-17 NOTE — Assessment & Plan Note (Signed)
Increase Crestor 40 mg daily. Check lipids and liver in 6 weeks.

## 2011-05-17 NOTE — Progress Notes (Signed)
Ryan Garcia is a 77 y.o. male for fu of CAD. He was admitted 10/5-10/6 with chest pain . This was a left scapular pain and was nonexertional. ECG at PCPs office was concerning and he was sent to the ED. MI was ruled out by serial enzymes. Symptoms were felt to be MSK. Outpatient Myoview was arranged. Myoview 04/16/11: mild apical ischemia, study not gated. Cath in Nov 2012 revealed normal LM. The LAD demonstrated a long 70-75% diffuse stenosis prior to the bifurcation and the vessel had a typical diabetic appearance. It was calcified. The diagonal had a 75% stenosis and there were tandem distal LAD lesions of 70-75%, with the vessel appearing typical for diabetes.  The circumflex had a tiny ramus without disease, a moderately large OM1 with a 75% area of proximal disease. The AV vessel, leading to the OM2 had tandem 50% lesions. Both vessels appeared diabetic, but graftable.  The RCA has a prox mid lesion of 60-70% . The PDA divides into two branches, and the larger branch has an 80% hypodense area of disease. This vessel is also graftable. EF 60. Dr Riley Kill felt patient should have either medical therapy or CABG and not PCI. Since he was last seen, he has dyspnea with exertion but no orthopnea, PND, pedal edema, palpitations, syncope or chest pain.    Current Outpatient Prescriptions  Medication Sig Dispense Refill  . aspirin 81 MG tablet Take 81 mg by mouth daily.        . beclomethasone (BECONASE AQ) 42 MCG/SPRAY nasal spray Place 1 spray into the nose daily. Dose is for each nostril.       . Flax OIL Take 1 capsule by mouth daily.        Marland Kitchen glipiZIDE (GLUCOTROL) 5 MG 24 hr tablet Take 5 mg by mouth daily.        Marland Kitchen glucose blood (FREESTYLE LITE) test strip Check blood sugar once daily and as needed. 250.0       . glucose monitoring kit (FREESTYLE) monitoring kit Check blood sugar once a day and as needed 250.0       . hydrochlorothiazide 25 MG tablet Take 25 mg by mouth daily.        . Lancets  (FREESTYLE) lancets Check glucose once daily and as needed.250.0       . lisinopril (PRINIVIL,ZESTRIL) 5 MG tablet Take 5 mg by mouth daily.        . metFORMIN (GLUCOPHAGE) 1000 MG tablet Take 1,000 mg by mouth 2 (two) times daily with a meal.        . nitroGLYCERIN (NITROSTAT) 0.4 MG SL tablet Place 1 tablet (0.4 mg total) under the tongue every 5 (five) minutes as needed for chest pain.  25 tablet  6  . Omega-3 Fatty Acids (FISH OIL PO) Take 2 capsules by mouth daily.        . ranitidine (ZANTAC) 150 MG tablet Take 150 mg by mouth 2 (two) times daily as needed.        . rosuvastatin (CRESTOR) 10 MG tablet Take 1 tablet (10 mg total) by mouth at bedtime.  30 tablet  11     Past Medical History  Diagnosis Date  . Allergy     allergic rhinitis  . Hx of skin cancer, basal cell     many skin cancers removed/under constant treatment.  . Diabetes mellitus     type II x8 years  . GERD (gastroesophageal reflux disease)   . Hypertension   .  Arthritis     OA  . BPH (benign prostatic hyperplasia)     microwave tx of prostate  . AC (acromioclavicular) joint bone spurs     lt ankle  . HLD (hyperlipidemia)     10/12: TC 208, TG 213, HDL 36, LDL 129  . CAD (coronary artery disease)     Myoview 04/16/11: mild apical ischemia, study not gated.    . Scoliosis   . Chronic upper back pain   . Obstructive sleep apnea on CPAP     Past Surgical History  Procedure Date  . Breast surgery 2009    breast lump removed benign    History   Social History  . Marital Status: Married    Spouse Name: N/A    Number of Children: N/A  . Years of Education: N/A   Occupational History  . Not on file.   Social History Main Topics  . Smoking status: Never Smoker   . Smokeless tobacco: Not on file  . Alcohol Use:   . Drug Use:   . Sexually Active:    Other Topics Concern  . Not on file   Social History Narrative   Married Physically active hard of hearing    ROS: no fevers or chills,  productive cough, hemoptysis, dysphasia, odynophagia, melena, hematochezia, dysuria, hematuria, rash, seizure activity, orthopnea, PND, pedal edema, claudication. Remaining systems are negative.  Physical Exam: Well-developed well-nourished in no acute distress.  Skin is warm and dry.  HEENT is normal.  Neck is supple. No thyromegaly.  Chest is clear to auscultation with normal expansion.  Cardiovascular exam is regular rate and rhythm.  Abdominal exam nontender or distended. No masses palpated. Extremities show no edema. neuro grossly intact

## 2011-05-23 ENCOUNTER — Ambulatory Visit (INDEPENDENT_AMBULATORY_CARE_PROVIDER_SITE_OTHER)
Admission: RE | Admit: 2011-05-23 | Discharge: 2011-05-23 | Disposition: A | Discharge status: Home / Self Care | Payer: Medicare Other | Source: Ambulatory Visit | Attending: Family Medicine | Admitting: Family Medicine

## 2011-05-23 ENCOUNTER — Ambulatory Visit (INDEPENDENT_AMBULATORY_CARE_PROVIDER_SITE_OTHER): Payer: Medicare Other | Admitting: Family Medicine

## 2011-05-23 ENCOUNTER — Encounter: Payer: Self-pay | Admitting: Family Medicine

## 2011-05-23 VITALS — BP 120/60 | HR 98 | Temp 99.7°F | Ht 72.0 in | Wt 249.0 lb

## 2011-05-23 DIAGNOSIS — R059 Cough, unspecified: Secondary | ICD-10-CM

## 2011-05-23 DIAGNOSIS — R9389 Abnormal findings on diagnostic imaging of other specified body structures: Secondary | ICD-10-CM

## 2011-05-23 DIAGNOSIS — R05 Cough: Secondary | ICD-10-CM

## 2011-05-23 DIAGNOSIS — R918 Other nonspecific abnormal finding of lung field: Secondary | ICD-10-CM

## 2011-05-23 IMAGING — CR DG CHEST 2V
2 series · 2 of 2 positions shown · non-contrast
Comparison: [DATE]

CLINICAL DATA: Cough

CHEST - 2 VIEW

[view not recorded (1 of 2)]
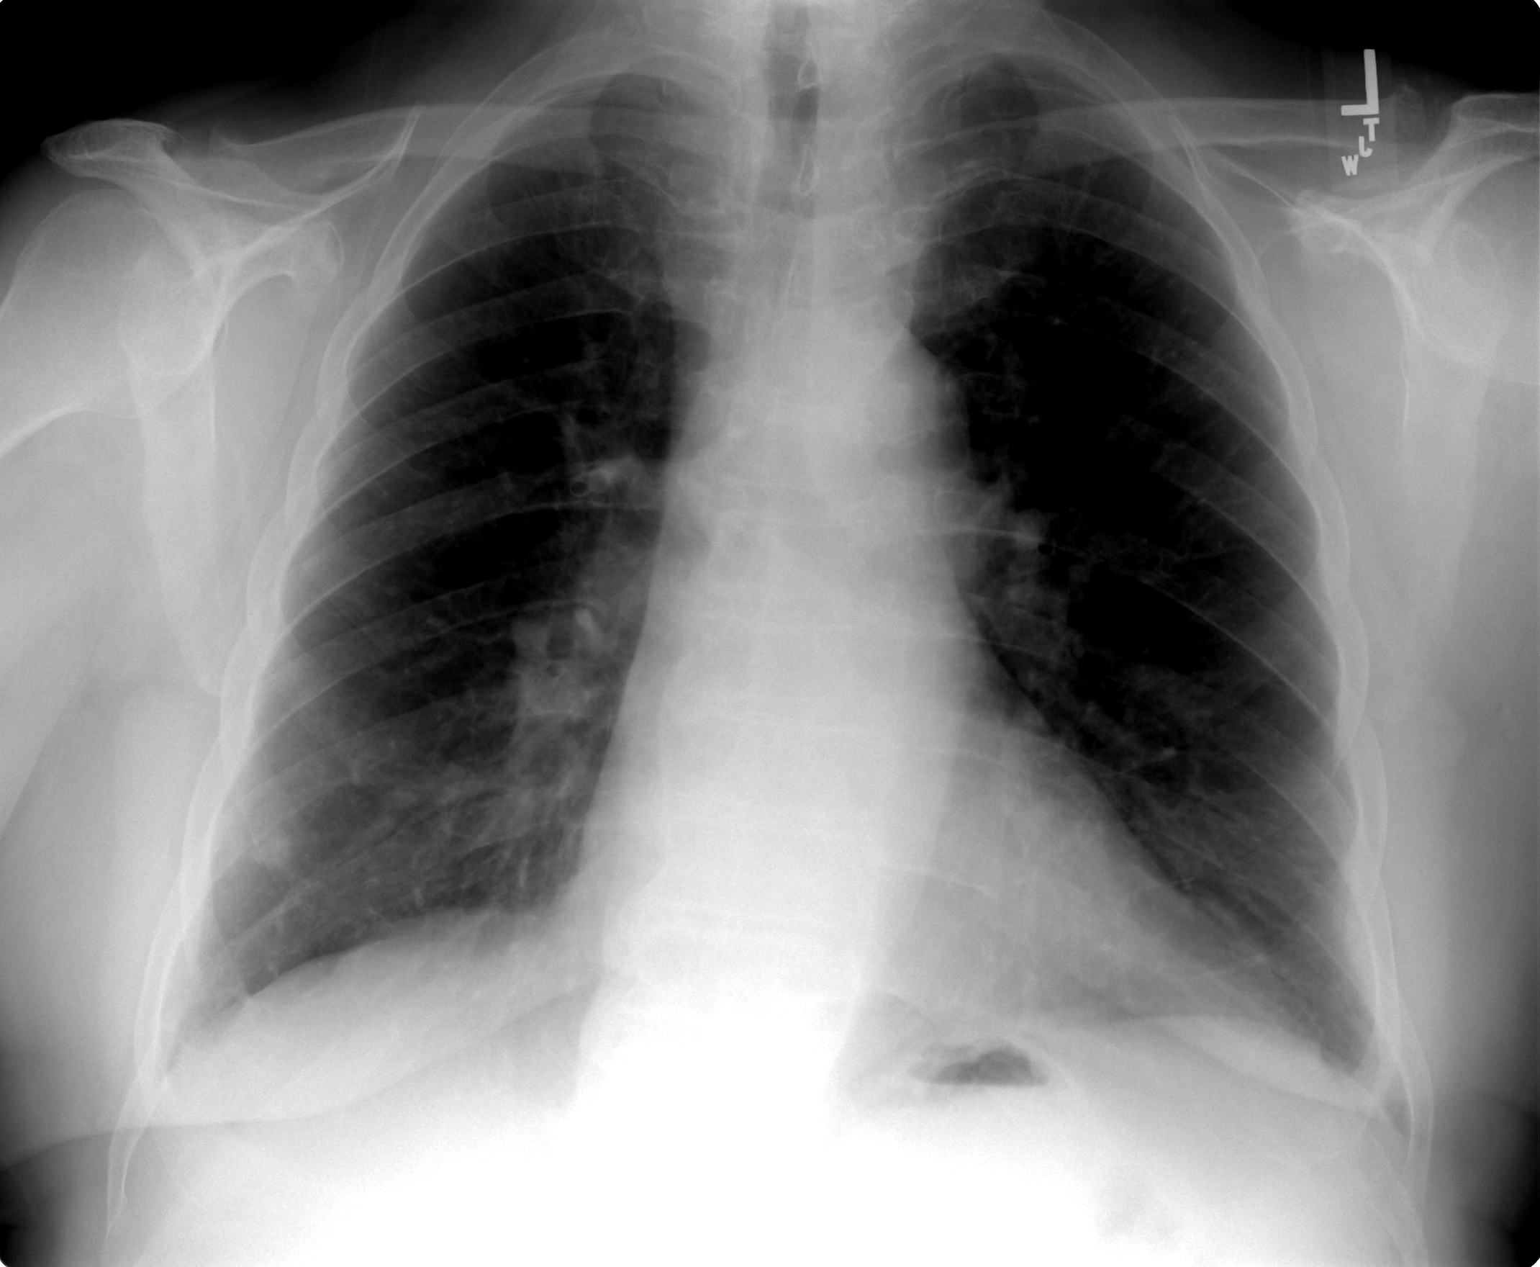

[view not recorded (2 of 2)]
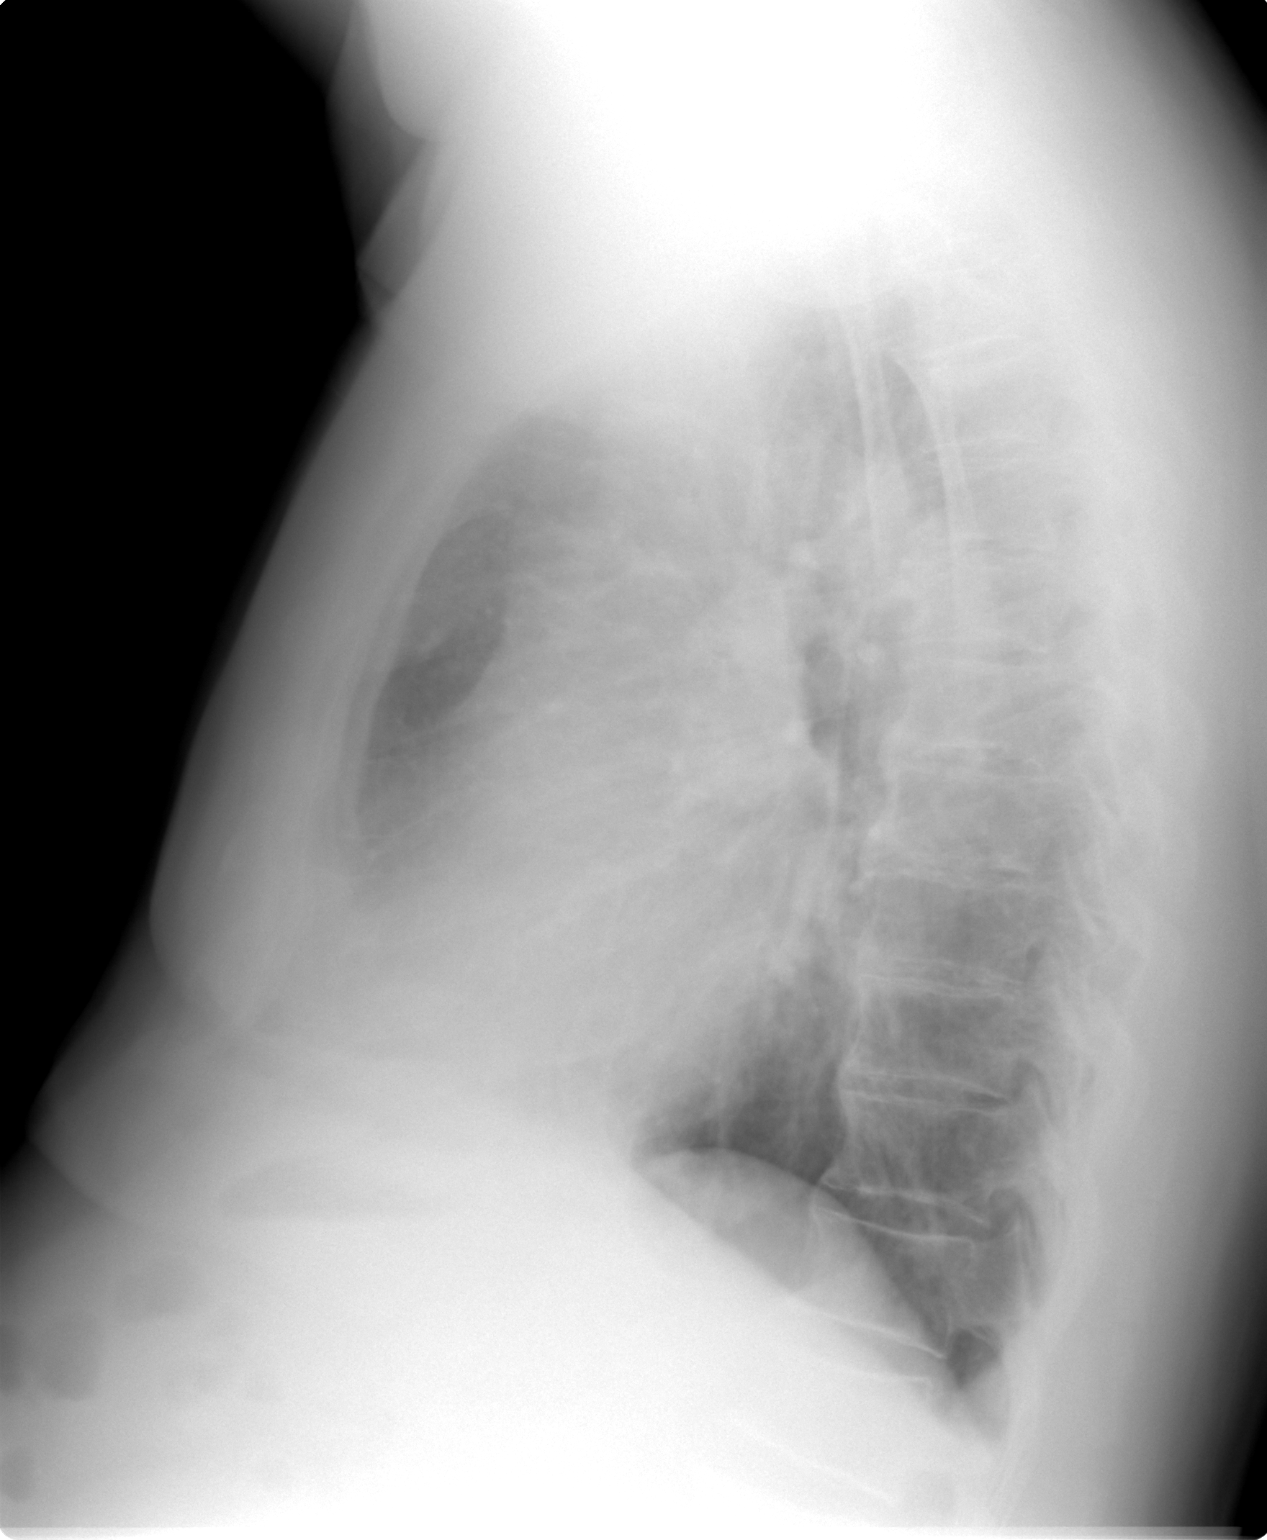

[2 of 2 positions shown; findings below may reference images not displayed]

FINDINGS: Stable prominent heart size and central vascularity.
Negative for CHF, edema, pneumonia, collapse or consolidation.  No
significant effusion.  No pneumothorax.  Trachea midline.  Stable
nodular density over the right lung base, could represent nipple
shadow, calcified granuloma or pulmonary nodule.  Consider
comparison with any remote studies.  This is stable compared to any
recent exam on [DATE].
IMPRESSION: No acute chest process
Right lower chest nodular density as described, remains
indeterminate.

## 2011-05-23 MED ORDER — GLUCOSE BLOOD VI STRP: ORAL_STRIP | Status: DC

## 2011-05-23 MED ORDER — DOXYCYCLINE HYCLATE 100 MG PO CAPS: 100.0000 mg | ORAL_CAPSULE | Freq: Two times a day (BID) | ORAL | Status: AC

## 2011-05-23 NOTE — Progress Notes (Signed)
  Subjective:    Patient ID: Ryan Garcia, male    DOB: 12-Jul-1933, 77 y.o.   MRN: [id number]  HPI CC: congestion  77yo new to me with h/o T2DM, HTN, HLD, CAD (recent cath 04/2011 with diffuse CAD, planned medical therapy) presents with 3-4d congestion, fatigue, weak.  Started as ST, cough.  Cough causing chest pain and cough productive of clear mucous.  + RN clear mucous.  Felt feverish.  Appetite down.  Much weaker than normal.  Has tried allergy med.  No abd pain, nausea/vomiting, myalgia, arthralgia, HA, sinus congestion.  No ear pain/tooth pain.    No known h/o asthma, COPD.  Never smoker.  No sick contacts.  Review of Systems Per HPI    Objective:   Physical Exam  Nursing note and vitals reviewed. Constitutional: He appears well-developed and well-nourished. No distress.  HENT:  Head: Normocephalic and atraumatic.  Right Ear: Hearing, tympanic membrane, external ear and ear canal normal.  Left Ear: Hearing, tympanic membrane, external ear and ear canal normal.  Nose: Nose normal. No mucosal edema or rhinorrhea. Right sinus exhibits no maxillary sinus tenderness and no frontal sinus tenderness. Left sinus exhibits no maxillary sinus tenderness and no frontal sinus tenderness.  Mouth/Throat: Uvula is midline, oropharynx is clear and moist and mucous membranes are normal. No oropharyngeal exudate, posterior oropharyngeal edema, posterior oropharyngeal erythema or tonsillar abscesses.  Eyes: Conjunctivae and EOM are normal. Pupils are equal, round, and reactive to light. No scleral icterus.  Neck: Normal range of motion. Neck supple. No thyromegaly present.  Cardiovascular: Normal rate, regular rhythm, normal heart sounds and intact distal pulses.   No murmur heard. Pulmonary/Chest: Effort normal. No respiratory distress. He has no wheezes. He has no rales.       Decreased breath sounds RLL>LLL, crackles RLL  Lymphadenopathy:    He has no cervical adenopathy.  Skin: Skin is warm  and dry. No rash noted.       Assessment & Plan:

## 2011-05-23 NOTE — Assessment & Plan Note (Addendum)
Productive cough with fatigue - O2 sat 92% today. Check CXR today to r/o effusion given lung findings - overall clear. Anticipate bronchitis likely viral, monitor for now, if not improving or any worsening, start doxycycline given comorbidities. update [place] if sxs not improving.

## 2011-05-23 NOTE — Patient Instructions (Addendum)
I think you have bronchitis. I sent in doxycycline twice daily for 10 days - may start if not improving as expected. May use simple mucinex over the counter with plenty of fluid to help mobilize mucous. Push fluids and rest over next few days. Please return if not improving as expected, if fever >101.5, or worsening cough or trouble breathing.

## 2011-05-24 DIAGNOSIS — R9389 Abnormal findings on diagnostic imaging of other specified body structures: Secondary | ICD-10-CM | POA: Insufficient documentation

## 2011-05-24 NOTE — Assessment & Plan Note (Signed)
Nipple shadow vs pulm nodule. Has appt with PCP 06/2011.  Will route to her as fyi, consider rpt CXR with nipple markers.

## 2011-06-01 ENCOUNTER — Telehealth: Payer: Self-pay | Admitting: Cardiology

## 2011-06-01 MED ORDER — ROSUVASTATIN CALCIUM 40 MG PO TABS: 40.0000 mg | ORAL_TABLET | Freq: Every day | ORAL | Status: DC

## 2011-06-01 NOTE — Telephone Encounter (Signed)
Spoke with pt wife, pt ID# [id number]. Insurance number [id number] Ryan Garcia

## 2011-06-01 NOTE — Telephone Encounter (Signed)
Prior auth number for crestor= [id number]. Ryan Garcia

## 2011-06-01 NOTE — Telephone Encounter (Signed)
New problem Pt's wife said they have been to [facility] pharmacy on [address] in [place] And they told her they need prior authorization for Crestor before they will fill it. He has no more pills Please call

## 2011-06-04 ENCOUNTER — Other Ambulatory Visit: Payer: Medicare Other

## 2011-06-07 ENCOUNTER — Telehealth: Payer: Self-pay | Admitting: Internal Medicine

## 2011-06-07 NOTE — Telephone Encounter (Signed)
That is fine, thanks for the update

## 2011-06-07 NOTE — Telephone Encounter (Signed)
Patient called and stated that he had went to see Dr. Jens Som and he put him on Crestor 40mg   And he wants to check his blood work in 6 weeks so he cancelled his appointment with you and rescheduled for 1.7.12 after he has his blood work done on December 27th and sees Dr. Jens Som.

## 2011-06-11 ENCOUNTER — Ambulatory Visit: Payer: Medicare Other | Admitting: Family Medicine

## 2011-06-28 ENCOUNTER — Other Ambulatory Visit (INDEPENDENT_AMBULATORY_CARE_PROVIDER_SITE_OTHER): Payer: Medicare Other

## 2011-06-28 DIAGNOSIS — I10 Essential (primary) hypertension: Secondary | ICD-10-CM

## 2011-06-28 DIAGNOSIS — E7800 Pure hypercholesterolemia, unspecified: Secondary | ICD-10-CM

## 2011-06-28 DIAGNOSIS — E119 Type 2 diabetes mellitus without complications: Secondary | ICD-10-CM

## 2011-06-28 LAB — COMPREHENSIVE METABOLIC PANEL
ALT: 30 U/L (ref 0–53)
Albumin: 3.9 g/dL (ref 3.5–5.2)
Alkaline Phosphatase: 70 U/L (ref 39–117)
Glucose, Bld: 163 mg/dL — ABNORMAL HIGH (ref 70–99)
Potassium: 4.2 mEq/L (ref 3.5–5.1)
Sodium: 141 mEq/L (ref 135–145)
Total Bilirubin: 0.7 mg/dL (ref 0.3–1.2)
Total Protein: 7 g/dL (ref 6.0–8.3)

## 2011-06-28 LAB — TSH: TSH: 2.57 u[IU]/mL (ref 0.35–5.50)

## 2011-06-28 LAB — CBC WITH DIFFERENTIAL/PLATELET
Basophils Absolute: 0 10*3/uL (ref 0.0–0.1)
Eosinophils Absolute: 0.2 10*3/uL (ref 0.0–0.7)
HCT: 41.2 % (ref 39.0–52.0)
Hemoglobin: 13.9 g/dL (ref 13.0–17.0)
Lymphs Abs: 1.9 10*3/uL (ref 0.7–4.0)
MCHC: 33.9 g/dL (ref 30.0–36.0)
MCV: 95.7 fl (ref 78.0–100.0)
Monocytes Absolute: 0.8 10*3/uL (ref 0.1–1.0)
Monocytes Relative: 9.8 % (ref 3.0–12.0)
Neutro Abs: 4.9 10*3/uL (ref 1.4–7.7)
Platelets: 174 10*3/uL (ref 150.0–400.0)
RDW: 15.6 % — ABNORMAL HIGH (ref 11.5–14.6)

## 2011-06-28 LAB — LIPID PANEL
Cholesterol: 108 mg/dL (ref 0–200)
VLDL: 19.8 mg/dL (ref 0.0–40.0)

## 2011-07-09 ENCOUNTER — Encounter: Payer: Self-pay | Admitting: Family Medicine

## 2011-07-09 ENCOUNTER — Ambulatory Visit (INDEPENDENT_AMBULATORY_CARE_PROVIDER_SITE_OTHER): Payer: Medicare Other | Admitting: Family Medicine

## 2011-07-09 VITALS — BP 140/72 | HR 60 | Temp 98.4°F | Ht 72.0 in | Wt 251.5 lb

## 2011-07-09 DIAGNOSIS — E7800 Pure hypercholesterolemia, unspecified: Secondary | ICD-10-CM

## 2011-07-09 DIAGNOSIS — E119 Type 2 diabetes mellitus without complications: Secondary | ICD-10-CM

## 2011-07-09 DIAGNOSIS — Z23 Encounter for immunization: Secondary | ICD-10-CM

## 2011-07-09 DIAGNOSIS — I10 Essential (primary) hypertension: Secondary | ICD-10-CM | POA: Diagnosis not present

## 2011-07-09 NOTE — Assessment & Plan Note (Signed)
Much imp with crestor Disc goals for lipids and reasons to control them Rev labs with pt Rev low sat fat diet in detail

## 2011-07-09 NOTE — Assessment & Plan Note (Signed)
bp in fair control at this time  No changes needed  Disc lifstyle change with low sodium diet and exercise   

## 2011-07-09 NOTE — Assessment & Plan Note (Signed)
a1c is stable at 6.6 - pt worried sugar is up due to crestor ? Or due to poor diet over the holidays Rev low glycemic diet On ace opthy utd F/u 3 mo with labs

## 2011-07-09 NOTE — Progress Notes (Signed)
Subjective:    Patient ID: Ryan Garcia, male    DOB: [birth year], 78 y.o.   MRN: [id number]  HPI Here for f/u of DM and HTN and chol in setting now of known CAD medically treated  Is generally feeling fine  No more chest or arm pain - doing well -- just some sob with exertion   bp is 140/72    Today On ace and beta blocker No cp or palpitations or headaches or edema  No side effects to medicines    Wt is up 2 lb with bmi of 34   DM is stable Sugars-- are high -- checks before bkfast -- avg 140 2 hours after supper 170-180-- occ over 200  No change in DM meds  Lab Results  Component Value Date   HGBA1C 6.6* 06/28/2011   opthy-was last may (exam was normal) On ace  Diet -- is eating recklessly -- now holidays are over -- ate what he wanted  Gets out and works all day - fast and steady pace   Cholesterol is much imp on crestor  Aggressively tx by cardiol He thought the crestor jacked up blood sugar Lab Results  Component Value Date   CHOL 108 06/28/2011   HDL 42.30 06/28/2011   LDLCALC 46 06/28/2011   LDLDIRECT 156.5 05/09/2010   TRIG 99.0 06/28/2011   CHOLHDL 3 06/28/2011   diet- was bad over the holidays  No muscle aches and pains   Wants to get out walking  Needs to get more stamina  Patient Active Problem List  Diagnoses  . DIABETES MELLITUS, TYPE II  . HYPERCHOLESTEROLEMIA  . OBESITY  . ERECTILE DYSFUNCTION  . HYPERTENSION  . ALLERGIC RHINITIS  . GERD  . OSTEOARTHRITIS  . BACK PAIN  . SLEEP APNEA  . EDEMA  . URINARY INCONTINENCE  . SKIN CANCER, HX OF  . Upper back pain, chronic  . Abnormal EKG  . Left arm pain  . Shingles  . Arm pain, left  . Decreased hearing  . Chest pain  . Abnormal Stress Myoview  . Shortness of breath  . CAD (coronary artery disease)  . Cough  . Abnormal chest x-ray   Past Medical History  Diagnosis Date  . Allergy     allergic rhinitis  . Hx of skin cancer, basal cell     many skin cancers removed/under constant  treatment.  . Diabetes mellitus     type II x8 years  . GERD (gastroesophageal reflux disease)   . Hypertension   . Arthritis     OA  . BPH (benign prostatic hyperplasia)     microwave tx of prostate  . AC (acromioclavicular) joint bone spurs     lt ankle  . HLD (hyperlipidemia)     10/12: TC 208, TG 213, HDL 36, LDL 129  . CAD (coronary artery disease)     Myoview 04/16/11: mild apical ischemia, study not gated.    . Scoliosis   . Chronic upper back pain   . Obstructive sleep apnea on CPAP    Past Surgical History  Procedure Date  . Breast surgery 2009    breast lump removed benign   History  Substance Use Topics  . Smoking status: Never Smoker   . Smokeless tobacco: Never Used  . Alcohol Use:    Family History  Problem Relation Age of Onset  . Heart attack Brother    Allergies  Allergen Reactions  . Loratadine  REACTION: not effective  . Simvastatin     REACTION: intolerant   Current Outpatient Prescriptions on File Prior to Visit  Medication Sig Dispense Refill  . aspirin 81 MG tablet Take 81 mg by mouth daily.        . beclomethasone (BECONASE AQ) 42 MCG/SPRAY nasal spray Place 1 spray into the nose daily. Dose is for each nostril.       . Flax OIL Take 1 capsule by mouth daily.        Marland Kitchen glipiZIDE (GLUCOTROL) 5 MG 24 hr tablet Take 5 mg by mouth daily.        Marland Kitchen glucose blood (FREESTYLE LITE) test strip Check blood sugar twice daily and as needed. 250.0  100 each    . hydrochlorothiazide 25 MG tablet Take 25 mg by mouth daily.        . Lancets (FREESTYLE) lancets Check glucose once daily and as needed.250.0       . lisinopril (PRINIVIL,ZESTRIL) 5 MG tablet Take 5 mg by mouth daily.        . metFORMIN (GLUCOPHAGE) 1000 MG tablet Take 1,000 mg by mouth 2 (two) times daily with a meal.        . metoprolol succinate (TOPROL XL) 25 MG 24 hr tablet Take 1 tablet (25 mg total) by mouth daily.  30 tablet  11  . Omega-3 Fatty Acids (FISH OIL PO) Take 2 capsules by  mouth daily.        . rosuvastatin (CRESTOR) 40 MG tablet Take 1 tablet (40 mg total) by mouth at bedtime.  30 tablet  11  . nitroGLYCERIN (NITROSTAT) 0.4 MG SL tablet Place 1 tablet (0.4 mg total) under the tongue every 5 (five) minutes as needed for chest pain.  25 tablet  6  . ranitidine (ZANTAC) 150 MG tablet Take 150 mg by mouth 2 (two) times daily as needed.               Review of Systems Review of Systems  Constitutional: Negative for fever, appetite change, fatigue and unexpected weight change.  Eyes: Negative for pain and visual disturbance.  Respiratory: Negative for cough and pos for sob on exetion  Cardiovascular: Negative for cp or palpitations    Gastrointestinal: Negative for nausea, diarrhea and constipation.  Genitourinary: Negative for urgency and frequency.  Skin: Negative for pallor or rash   Neurological: Negative for weakness, light-headedness, numbness and headaches.  Hematological: Negative for adenopathy. Does not bruise/bleed easily.  Psychiatric/Behavioral: Negative for dysphoric mood. The patient is not nervous/anxious.          Objective:   Physical Exam  Constitutional: He appears well-nourished. No distress.       overwt and well appearing   HENT:  Head: Normocephalic and atraumatic.  Mouth/Throat: Oropharynx is clear and moist.  Eyes: Conjunctivae and EOM are normal. Pupils are equal, round, and reactive to light. No scleral icterus.  Neck: Normal range of motion. Neck supple. No JVD present. Carotid bruit is not present. No thyromegaly present.  Cardiovascular: Normal rate, regular rhythm, normal heart sounds and intact distal pulses.  Exam reveals no gallop.   No murmur heard. Pulmonary/Chest: Effort normal and breath sounds normal. No respiratory distress. He has no wheezes.       No crackles bs are distant   Abdominal: Soft. Bowel sounds are normal. He exhibits no distension, no abdominal bruit and no mass. There is no tenderness.    Musculoskeletal: Normal range of motion. He  exhibits no edema and no tenderness.  Lymphadenopathy:    He has no cervical adenopathy.  Neurological: He is alert. He has normal reflexes. No cranial nerve deficit. He exhibits normal muscle tone. Coordination normal.  Skin: Skin is warm and dry. No rash noted. No erythema. No pallor.  Psychiatric: He has a normal mood and affect.       Somewhat irritable and argumentative today  Has a lot of excuses for issues and symptoms and lifestyle change          Assessment & Plan:

## 2011-07-09 NOTE — Patient Instructions (Addendum)
Try to stay as active as possible No change in medicines right now  Keep checking sugar twice daily - and try to eat a diabetic diet - please stick to it and think about loosing some weight Aim to walk 5 days per week if you can  Schedule fasting labs in 3 months and then follow up  Flu shot today  If you are interested in a shingles/zoster vaccine - call your insurance to check on coverage,( you should not get it within 1 month of other vaccines) , then call us for a prescription  for it to take to a pharmacy that gives the shot

## 2011-07-30 ENCOUNTER — Other Ambulatory Visit: Payer: Self-pay | Admitting: Family Medicine

## 2011-08-27 ENCOUNTER — Ambulatory Visit (INDEPENDENT_AMBULATORY_CARE_PROVIDER_SITE_OTHER): Payer: Medicare Other | Admitting: Cardiology

## 2011-08-27 ENCOUNTER — Encounter: Payer: Self-pay | Admitting: Cardiology

## 2011-08-27 VITALS — BP 155/81 | HR 60 | Ht 72.0 in | Wt 253.0 lb

## 2011-08-27 DIAGNOSIS — I2510 Atherosclerotic heart disease of native coronary artery without angina pectoris: Secondary | ICD-10-CM | POA: Diagnosis not present

## 2011-08-27 MED ORDER — LISINOPRIL 20 MG PO TABS: 20.0000 mg | ORAL_TABLET | Freq: Every day | ORAL | Status: DC

## 2011-08-27 MED ORDER — ROSUVASTATIN CALCIUM 40 MG PO TABS: 40.0000 mg | ORAL_TABLET | Freq: Every day | ORAL | Status: DC

## 2011-08-27 NOTE — Assessment & Plan Note (Signed)
Patient without chest pain. I have previously reviewed the patient's cath films with Dr Stuckey and Dr Hochrein. He has significant diffuse coronary disease. Some of his distal vessels, particularly the LAD, are small and may be difficult to graft. We therefore plan medical therapy for now. We can consider coronary artery bypass and graft in the future if he has refractory symptoms. Continue aspirin and statin.  

## 2011-08-27 NOTE — Patient Instructions (Signed)
Your physician wants you to follow-up in: 6 MONTHS You will receive a reminder letter in the mail two months in advance. If you don't receive a letter, please call our office to schedule the follow-up appointment.   STOP METOPROLOL   INCREASE LISINOPRIL TO 20 MG ONCE DAILY  Your physician recommends that you return for lab work in: ONE WEEK

## 2011-08-27 NOTE — Assessment & Plan Note (Signed)
Blood pressure is elevated. He is also complaining of fatigue that he feels is related to his beta blocker. Discontinue metoprolol. Increase lisinopril to 20 mg daily. Check potassium and renal function in one week.

## 2011-08-27 NOTE — Progress Notes (Signed)
[name] male for fu of CAD. He was admitted 10/5-10/6 with chest pain . This was a left scapular pain and was nonexertional. ECG at PCPs office was concerning and he was sent to the ED. MI was ruled out by serial enzymes. Symptoms were felt to be MSK. Outpatient Myoview was arranged. Myoview 04/16/11: mild apical ischemia, study not gated. Cath in Nov 2012 revealed normal LM. The LAD demonstrated a long 70-75% diffuse stenosis prior to the bifurcation and the vessel had a typical diabetic appearance. It was calcified. The diagonal had a 75% stenosis and there were tandem distal LAD lesions of 70-75%, with the vessel appearing typical for diabetes. The circumflex had a tiny ramus without disease, a moderately large OM1 with a 75% area of proximal disease. The AV vessel, leading to the OM2 had tandem 50% lesions. Both vessels appeared diabetic, but graftable. The RCA has a prox mid lesion of 60-70% . The PDA divides into two branches, and the larger branch has an 80% hypodense area of disease. This vessel is also graftable. EF 60. Dr Riley Kill felt patient should have either medical therapy or CABG and not PCI. I reviewed films with Dr Antoine Poche and elected medical therapy. Since he was last seen in Nov 2012, he has dyspnea with exertion but no orthopnea, PND, pedal edema, palpitations, syncope or chest pain.   Current Outpatient Prescriptions  Medication Sig Dispense Refill  . aspirin 81 MG tablet Take 81 mg by mouth daily.        . beclomethasone (BECONASE AQ) 42 MCG/SPRAY nasal spray Place 1 spray into the nose daily. Dose is for each nostril.       . Flax OIL Take 1 capsule by mouth daily.        Marland Kitchen glipiZIDE (GLUCOTROL) 5 MG 24 hr tablet Take 5 mg by mouth daily.        Marland Kitchen glucose blood (FREESTYLE LITE) test strip Check blood sugar twice daily and as needed. 250.0  100 each    . hydrochlorothiazide 25 MG tablet Take 25 mg by mouth daily.        . Lancets (FREESTYLE) lancets Check glucose once daily  and as needed.250.0       . lisinopril (PRINIVIL,ZESTRIL) 5 MG tablet TAKE ONE TABLET BY MOUTH EVERY DAY  90 tablet  1  . metFORMIN (GLUCOPHAGE) 1000 MG tablet Take 1,000 mg by mouth 2 (two) times daily with a meal.        . metoprolol succinate (TOPROL XL) 25 MG 24 hr tablet Take 1 tablet (25 mg total) by mouth daily.  30 tablet  11  . nitroGLYCERIN (NITROSTAT) 0.4 MG SL tablet Place 1 tablet (0.4 mg total) under the tongue every 5 (five) minutes as needed for chest pain.  25 tablet  6  . Omega-3 Fatty Acids (FISH OIL PO) Take 2 capsules by mouth daily.        . ranitidine (ZANTAC) 150 MG tablet Take 150 mg by mouth 2 (two) times daily as needed.        . rosuvastatin (CRESTOR) 40 MG tablet Take 1 tablet (40 mg total) by mouth at bedtime.  30 tablet  11     Past Medical History  Diagnosis Date  . Allergy     allergic rhinitis  . Hx of skin cancer, basal cell     many skin cancers removed/under constant treatment.  . Diabetes mellitus     type II x8 years  . GERD (gastroesophageal  reflux disease)   . Hypertension   . Arthritis     OA  . BPH (benign prostatic hyperplasia)     microwave tx of prostate  . AC (acromioclavicular) joint bone spurs     lt ankle  . HLD (hyperlipidemia)     10/12: TC 208, TG 213, HDL 36, LDL 129  . CAD (coronary artery disease)     Myoview 04/16/11: mild apical ischemia, study not gated.    . Scoliosis   . Chronic upper back pain   . Obstructive sleep apnea on CPAP     Past Surgical History  Procedure Date  . Breast surgery 2009    breast lump removed benign    History   Social History  . Marital Status: Married    Spouse Name: N/A    Number of Children: N/A  . Years of Education: N/A   Occupational History  . Not on file.   Social History Main Topics  . Smoking status: Never Smoker   . Smokeless tobacco: Never Used  . Alcohol Use:   . Drug Use:   . Sexually Active:    Other Topics Concern  . Not on file   Social History  Narrative   Married Physically active hard of hearing    ROS: Some fatigue but no fevers or chills, productive cough, hemoptysis, dysphasia, odynophagia, melena, hematochezia, dysuria, hematuria, rash, seizure activity, orthopnea, PND, pedal edema, claudication. Remaining systems are negative.  Physical Exam: Well-developed well-nourished in no acute distress.  Skin is warm and dry.  HEENT is normal.  Neck is supple. No thyromegaly.  Chest is clear to auscultation with normal expansion.  Cardiovascular exam is regular rate and rhythm.  Abdominal exam nontender or distended. No masses palpated. Extremities show no edema. neuro grossly intact  ECG sinus rhythm at a rate of 60. Occasional PAC.

## 2011-08-27 NOTE — Assessment & Plan Note (Signed)
Continue statin. 

## 2011-09-03 ENCOUNTER — Other Ambulatory Visit (INDEPENDENT_AMBULATORY_CARE_PROVIDER_SITE_OTHER): Payer: Medicare Other

## 2011-09-03 DIAGNOSIS — I2510 Atherosclerotic heart disease of native coronary artery without angina pectoris: Secondary | ICD-10-CM

## 2011-09-03 LAB — BASIC METABOLIC PANEL
BUN: 19 mg/dL (ref 6–23)
Chloride: 104 mEq/L (ref 96–112)
GFR: 61.46 mL/min (ref >60.00)
Potassium: 4 mEq/L (ref 3.5–5.1)
Sodium: 140 mEq/L (ref 135–145)

## 2011-09-04 ENCOUNTER — Telehealth: Payer: Self-pay | Admitting: Cardiology

## 2011-09-04 NOTE — Telephone Encounter (Signed)
Spoke with pt, aware of lab results. 

## 2011-09-04 NOTE — Telephone Encounter (Signed)
Fu call °Patient returning your call °

## 2011-09-29 ENCOUNTER — Other Ambulatory Visit: Payer: Self-pay | Admitting: Family Medicine

## 2011-10-01 ENCOUNTER — Other Ambulatory Visit (INDEPENDENT_AMBULATORY_CARE_PROVIDER_SITE_OTHER): Payer: Medicare Other

## 2011-10-01 ENCOUNTER — Telehealth: Payer: Self-pay

## 2011-10-01 DIAGNOSIS — E119 Type 2 diabetes mellitus without complications: Secondary | ICD-10-CM

## 2011-10-01 LAB — HEMOGLOBIN A1C: Hgb A1c MFr Bld: 7.4 % — ABNORMAL HIGH (ref 4.6–6.5)

## 2011-10-01 NOTE — Telephone Encounter (Signed)
Pt has card from Dr Jens Som that he is in a study and should not have lipid checked except when ordered by Dr Jens Som. Pt scheduled today in lab for Lipid and AIC. Dr Milinda Antis said to only do A1C. Tasha in lab notified.

## 2011-10-01 NOTE — Telephone Encounter (Signed)
Thanks, aware.

## 2011-10-03 ENCOUNTER — Other Ambulatory Visit: Payer: Self-pay | Admitting: Family Medicine

## 2011-10-08 ENCOUNTER — Encounter: Payer: Self-pay | Admitting: Family Medicine

## 2011-10-08 ENCOUNTER — Ambulatory Visit (INDEPENDENT_AMBULATORY_CARE_PROVIDER_SITE_OTHER): Payer: Medicare Other | Admitting: Family Medicine

## 2011-10-08 VITALS — BP 122/60 | HR 68 | Temp 97.9°F | Ht 72.0 in | Wt 252.2 lb

## 2011-10-08 DIAGNOSIS — E669 Obesity, unspecified: Secondary | ICD-10-CM

## 2011-10-08 DIAGNOSIS — E119 Type 2 diabetes mellitus without complications: Secondary | ICD-10-CM

## 2011-10-08 DIAGNOSIS — E7800 Pure hypercholesterolemia, unspecified: Secondary | ICD-10-CM

## 2011-10-08 MED ORDER — GLIPIZIDE 10 MG PO TABS: 10.0000 mg | ORAL_TABLET | Freq: Two times a day (BID) | ORAL | Status: DC

## 2011-10-08 NOTE — Assessment & Plan Note (Signed)
Sugars are up with poor motivation to change lifestyle and also inc in sugar from his statin  Rev DM diet and exercise- not motivated Reminded opthy due next mo Inc glipizide xl to 10 mg daily if tolerated F/u 3 mo after lab

## 2011-10-08 NOTE — Progress Notes (Signed)
Subjective:    Patient ID: Ryan Garcia, male    DOB: 1933-12-26, 78 y.o.   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 exercise  Symptoms none at all  A1C last was 7.4 up from 6.6 -- suspect this is from the statin --inc his sugar  No problems with medications  Renal protection on ace Last eye exam was 5/12- up to date for another month     Wt is down 1 lb with bmi of 34 Is obese and he is not motivated to loose at all  He does not want to "live forever" He would rather enjoy his current quality of life eating and doing what he wants to   No cardiac flares Continues to f/u with cardiology Is in study for the crestor  We were inst not to check his cholesterol at all - this will be done by the study lab  Patient Active Problem List  Diagnoses  . DIABETES MELLITUS, TYPE II  . HYPERCHOLESTEROLEMIA  . OBESITY  . ERECTILE DYSFUNCTION  . HYPERTENSION  . ALLERGIC RHINITIS  . GERD  . OSTEOARTHRITIS  . BACK PAIN  . SLEEP APNEA  . EDEMA  . URINARY INCONTINENCE  . SKIN CANCER, HX OF  . Upper back pain, chronic  . Abnormal EKG  . Left arm pain  . Shingles  . Arm pain, left  . Decreased hearing  . Chest pain  . Abnormal Stress Myoview  . Shortness of breath  . CAD (coronary artery disease)  . Cough  . Abnormal chest x-ray   Past Medical History  Diagnosis Date  . Allergy     allergic rhinitis  . Hx of skin cancer, basal cell     many skin cancers removed/under constant treatment.  . Diabetes mellitus     type II x8 years  . GERD (gastroesophageal reflux disease)   . Hypertension   . Arthritis     OA  . BPH (benign prostatic  hyperplasia)     microwave tx of prostate  . AC (acromioclavicular) joint bone spurs     lt ankle  . HLD (hyperlipidemia)     10/12: TC 208, TG 213, HDL 36, LDL 129  . CAD (coronary artery disease)     Myoview 04/16/11: mild apical ischemia, study not gated.    . Scoliosis   . Chronic upper back pain   . Obstructive sleep apnea on CPAP    Past Surgical History  Procedure Date  . Breast surgery 2009    breast lump removed benign   History  Substance Use Topics  . Smoking status: Never Smoker   . Smokeless tobacco: Never Used  . Alcohol Use:    Family History  Problem Relation Age of Onset  . Heart attack Brother    Allergies  Allergen Reactions  . Loratadine     REACTION: not effective  . Simvastatin     REACTION: intolerant   Current Outpatient Prescriptions on File Prior to Visit  Medication Sig Dispense Refill  . aspirin 81 MG tablet Take 81  mg by mouth daily.        . beclomethasone (BECONASE AQ) 42 MCG/SPRAY nasal spray Place 1 spray into the nose daily. Dose is for each nostril.       . Flax OIL Take 1 capsule by mouth daily.        Marland Kitchen glucose blood (FREESTYLE LITE) test strip Check blood sugar twice daily and as needed. 250.0  100 each    . hydrochlorothiazide (HYDRODIURIL) 25 MG tablet TAKE ONE TABLET BY MOUTH EVERY DAY  90 tablet  3  . Lancets (FREESTYLE) lancets Check glucose once daily and as needed.250.0       . lisinopril (PRINIVIL,ZESTRIL) 20 MG tablet Take 1 tablet (20 mg total) by mouth daily.  90 tablet  4  . metFORMIN (GLUCOPHAGE) 1000 MG tablet Take 1,000 mg by mouth 2 (two) times daily with a meal.        . nitroGLYCERIN (NITROSTAT) 0.4 MG SL tablet Place 1 tablet (0.4 mg total) under the tongue every 5 (five) minutes as needed for chest pain.  25 tablet  6  . Omega-3 Fatty Acids (FISH OIL PO) Take 2 capsules by mouth daily.        . ranitidine (ZANTAC) 150 MG tablet Take 150 mg by mouth 2 (two) times daily as needed.        . rosuvastatin (CRESTOR) 40  MG tablet Take 1 tablet (40 mg total) by mouth at bedtime.  90 tablet  4        Review of Systems Review of Systems  Constitutional: Negative for fever, appetite change, and unexpected weight change. some fatigue from a busy schedule Eyes: Negative for pain and visual disturbance.  Respiratory: Negative for cough and shortness of breath.   Cardiovascular: Negative for cp or palpitations    Gastrointestinal: Negative for nausea, diarrhea and constipation.  Genitourinary: Negative for urgency and frequency.  Skin: Negative for pallor or rash   Neurological: Negative for weakness, light-headedness, numbness and headaches.  Hematological: Negative for adenopathy. Does not bruise/bleed easily.  Psychiatric/Behavioral: Negative for dysphoric mood. The patient is not nervous/anxious.         Objective:   Physical Exam  Constitutional: He appears well-developed and well-nourished. No distress.  HENT:  Head: Normocephalic and atraumatic.  Mouth/Throat: Oropharynx is clear and moist.  Eyes: Conjunctivae and EOM are normal. Pupils are equal, round, and reactive to light. No scleral icterus.  Neck: Normal range of motion. Neck supple. No JVD present. Carotid bruit is not present. No thyromegaly present.  Cardiovascular: Normal rate, regular rhythm, normal heart sounds and intact distal pulses.  Exam reveals no gallop.   Pulmonary/Chest: Effort normal and breath sounds normal. No respiratory distress. He has no wheezes.  Abdominal: Soft. Bowel sounds are normal. He exhibits no distension and no abdominal bruit. There is no tenderness.  Musculoskeletal: He exhibits no edema.  Lymphadenopathy:    He has no cervical adenopathy.  Neurological: He is alert. He has normal reflexes. No cranial nerve deficit. He exhibits normal muscle tone.  Skin: Skin is warm and dry. No rash noted.  Psychiatric: He has a normal mood and affect.          Assessment & Plan:

## 2011-10-08 NOTE — Assessment & Plan Note (Signed)
crestor has controlled lipids but inc sugar Pt is in a study- we are not supposed to check lipids  Rev low sat fat diet

## 2011-10-08 NOTE — Assessment & Plan Note (Signed)
Discussed how this problem influences overall health and the risks it imposes  Reviewed plan for weight loss with lower calorie diet (via better food choices and also portion control or program like weight watchers) and exercise building up to or more than 30 minutes 5 days per week including some aerobic activity   Pt understands and is not willing to change at this time

## 2011-10-08 NOTE — Patient Instructions (Signed)
If you become motivated- please start following a diabetic diet more closely  Don't forget your diabetic eye exam is due next month  Try to get a bit more exercise  Increase glipizide to 10 mg once daily -- to help sugar Keep checking sugars twice daily  Schedule non fasting lab in 3 months and follow up  If side effects or low sugars please let me know

## 2011-11-23 ENCOUNTER — Other Ambulatory Visit: Payer: Self-pay | Admitting: Family Medicine

## 2011-11-28 ENCOUNTER — Other Ambulatory Visit: Payer: Self-pay | Admitting: Family Medicine

## 2011-12-14 ENCOUNTER — Telehealth: Payer: Self-pay | Admitting: *Deleted

## 2011-12-14 NOTE — Telephone Encounter (Signed)
Form for diabetic supplies is on your shelf.  I checked with the patient's wife and pt does want these supplies.

## 2011-12-17 DIAGNOSIS — D485 Neoplasm of uncertain behavior of skin: Secondary | ICD-10-CM | POA: Diagnosis not present

## 2011-12-17 DIAGNOSIS — Z85828 Personal history of other malignant neoplasm of skin: Secondary | ICD-10-CM | POA: Diagnosis not present

## 2011-12-17 DIAGNOSIS — C44211 Basal cell carcinoma of skin of unspecified ear and external auricular canal: Secondary | ICD-10-CM | POA: Diagnosis not present

## 2011-12-17 DIAGNOSIS — L570 Actinic keratosis: Secondary | ICD-10-CM | POA: Diagnosis not present

## 2011-12-17 NOTE — Telephone Encounter (Signed)
Completed order form faxed to [facility] at [phone], sent to be scanned/SLS

## 2011-12-17 NOTE — Telephone Encounter (Signed)
Done and in IN box 

## 2012-01-18 ENCOUNTER — Other Ambulatory Visit (INDEPENDENT_AMBULATORY_CARE_PROVIDER_SITE_OTHER): Payer: Medicare Other

## 2012-01-18 ENCOUNTER — Telehealth: Payer: Self-pay | Admitting: Family Medicine

## 2012-01-18 DIAGNOSIS — E119 Type 2 diabetes mellitus without complications: Secondary | ICD-10-CM

## 2012-01-18 LAB — HEMOGLOBIN A1C: Hgb A1c MFr Bld: 7.2 % — ABNORMAL HIGH (ref 4.6–6.5)

## 2012-01-18 NOTE — Telephone Encounter (Signed)
Pt is needing refill on test strips for his meter. He is now with [facility] ??

## 2012-01-18 NOTE — Telephone Encounter (Signed)
If he is with [facility]- I need forms from them to fill out

## 2012-01-21 ENCOUNTER — Encounter: Payer: Self-pay | Admitting: Family Medicine

## 2012-01-21 ENCOUNTER — Ambulatory Visit (INDEPENDENT_AMBULATORY_CARE_PROVIDER_SITE_OTHER): Payer: Medicare Other | Admitting: Family Medicine

## 2012-01-21 VITALS — BP 120/70 | HR 51 | Temp 98.7°F | Ht 72.0 in | Wt 250.0 lb

## 2012-01-21 DIAGNOSIS — E119 Type 2 diabetes mellitus without complications: Secondary | ICD-10-CM

## 2012-01-21 DIAGNOSIS — I10 Essential (primary) hypertension: Secondary | ICD-10-CM | POA: Diagnosis not present

## 2012-01-21 MED ORDER — GLUCOSE BLOOD VI STRP: ORAL_STRIP | Status: DC

## 2012-01-21 NOTE — Assessment & Plan Note (Signed)
a1c is down to 7.2, no hypoglycemia with inc in glipizide Pt declines insulin  Is on crestor - which he thinks inc sugar , but has to be on it  Disc lifestyle change- not much motivation for this or wt loss F/u 6 mo

## 2012-01-21 NOTE — Progress Notes (Signed)
Subjective:    Patient ID: Ryan Garcia, male    DOB: 1933/07/18, 78 y.o.   MRN: [id number]  HPI Here for f/u of DM and HTN   Is feeling ok in general  Is doing a fair amt of work this summer   Wt is down 2 lb    bp is  147/80   Today No cp or palpitations or headaches or edema  No side effects to medicines    Diabetes Home sugar results - no episodes of low sugars  Checked sugar bid for a while , ran out of equiptment and has to change his company - though the mail  Sugars were "ok" but he does not have numbers - pp sometimes in 140s  DM diet - more fruits and vegetables  Exercise - fair  Symptoms A1C last down from 7.4 to 7.2  No problems with medications -went up on glipizide to 10 Renal protection-on ace  Last eye exam - is up to date   CAD- is stable - just gets out of breath occas   Patient Active Problem List  Diagnosis  . DIABETES MELLITUS, TYPE II  . HYPERCHOLESTEROLEMIA  . OBESITY  . ERECTILE DYSFUNCTION  . HYPERTENSION  . ALLERGIC RHINITIS  . GERD  . OSTEOARTHRITIS  . BACK PAIN  . SLEEP APNEA  . EDEMA  . URINARY INCONTINENCE  . SKIN CANCER, HX OF  . Upper back pain, chronic  . Abnormal EKG  . Left arm pain  . Shingles  . Arm pain, left  . Decreased hearing  . Chest pain  . Abnormal Stress Myoview  . Shortness of breath  . CAD (coronary artery disease)  . Cough  . Abnormal chest x-ray   Past Medical History  Diagnosis Date  . Allergy     allergic rhinitis  . Hx of skin cancer, basal cell     many skin cancers removed/under constant treatment.  . Diabetes mellitus     type II x8 years  . GERD (gastroesophageal reflux disease)   . Hypertension   . Arthritis     OA  . BPH (benign prostatic hyperplasia)     microwave tx of prostate  . AC (acromioclavicular) joint bone spurs     lt ankle  . HLD (hyperlipidemia)     10/12: TC 208, TG 213, HDL 36, LDL 129  . CAD (coronary artery disease)     Myoview 04/16/11: mild apical ischemia,  study not gated.    . Scoliosis   . Chronic upper back pain   . Obstructive sleep apnea on CPAP    Past Surgical History  Procedure Date  . Breast surgery 2009    breast lump removed benign   History  Substance Use Topics  . Smoking status: Never Smoker   . Smokeless tobacco: Never Used  . Alcohol Use: No   Family History  Problem Relation Age of Onset  . Heart attack Brother    Allergies  Allergen Reactions  . Loratadine     REACTION: not effective  . Simvastatin     REACTION: intolerant   Current Outpatient Prescriptions on File Prior to Visit  Medication Sig Dispense Refill  . aspirin 81 MG tablet Take 81 mg by mouth daily.        . beclomethasone (BECONASE AQ) 42 MCG/SPRAY nasal spray Place 1 spray into the nose daily. Dose is for each nostril.       . cetirizine (ZYRTEC) 10 MG tablet TAKE  ONE TABLET BY MOUTH EVERY DAY  90 tablet  3  . Flax OIL Take 1 capsule by mouth daily.        Marland Kitchen glipiZIDE (GLUCOTROL) 10 MG tablet Take 1 tablet (10 mg total) by mouth 2 (two) times daily before a meal.  30 tablet  11  . hydrochlorothiazide (HYDRODIURIL) 25 MG tablet TAKE ONE TABLET BY MOUTH EVERY DAY  90 tablet  3  . Lancets (FREESTYLE) lancets Check glucose once daily and as needed.250.0       . lisinopril (PRINIVIL,ZESTRIL) 20 MG tablet Take 1 tablet (20 mg total) by mouth daily.  90 tablet  4  . metFORMIN (GLUCOPHAGE) 1000 MG tablet TAKE ONE TABLET BY MOUTH TWICE DAILY  180 tablet  3  . nitroGLYCERIN (NITROSTAT) 0.4 MG SL tablet Place 1 tablet (0.4 mg total) under the tongue every 5 (five) minutes as needed for chest pain.  25 tablet  6  . Omega-3 Fatty Acids (FISH OIL PO) Take 2 capsules by mouth daily.        . ranitidine (ZANTAC) 150 MG tablet Take 150 mg by mouth 2 (two) times daily as needed.        . rosuvastatin (CRESTOR) 40 MG tablet Take 1 tablet (40 mg total) by mouth at bedtime.  90 tablet  4        Review of Systems Review of Systems  Constitutional: Negative for  fever, appetite change, fatigue and unexpected weight change.  Eyes: Negative for pain and visual disturbance.  Respiratory: Negative for cough and shortness of breath.   Cardiovascular: Negative for cp or palpitations    Gastrointestinal: Negative for nausea, diarrhea and constipation.  Genitourinary: Negative for urgency and frequency.  Skin: Negative for pallor or rash   MSK pos for generalized stiffness from OA, neg for muscle pain  Neurological: Negative for weakness, light-headedness, numbness and headaches.  Hematological: Negative for adenopathy. Does not bruise/bleed easily.  Psychiatric/Behavioral: Negative for dysphoric mood. The patient is not nervous/anxious.         Objective:   Physical Exam  Constitutional: He appears well-developed and well-nourished. No distress.       Obese and well appearing   HENT:  Head: Normocephalic and atraumatic.  Mouth/Throat: Oropharynx is clear and moist.  Eyes: Conjunctivae and EOM are normal. Pupils are equal, round, and reactive to light. No scleral icterus.  Neck: Normal range of motion. Neck supple. No JVD present. Carotid bruit is not present. No thyromegaly present.  Cardiovascular: Normal rate, regular rhythm, normal heart sounds and intact distal pulses.  Exam reveals no gallop.   Pulmonary/Chest: Effort normal and breath sounds normal. No respiratory distress. He has no wheezes.  Abdominal: He exhibits no abdominal bruit.  Musculoskeletal: He exhibits no edema.  Lymphadenopathy:    He has no cervical adenopathy.  Neurological: He is alert. He has normal reflexes. No cranial nerve deficit. He exhibits normal muscle tone. Coordination normal.  Skin: Skin is warm and dry. No rash noted. No erythema. No pallor.  Psychiatric: He has a normal mood and affect.          Assessment & Plan:

## 2012-01-21 NOTE — Patient Instructions (Addendum)
Sugar is looking a bit better - keep working if you are motivated to do so  Stay away from sugar and starches/ and stay as active as possible Blood pressure is good  No changes in medicine  Follow up in 6 months for annual exam with labs prior

## 2012-01-21 NOTE — Assessment & Plan Note (Signed)
bp better on 2nd check bp in fair control at this time  No changes needed Disc lifstyle change with low sodium diet and exercise   

## 2012-02-14 DIAGNOSIS — D235 Other benign neoplasm of skin of trunk: Secondary | ICD-10-CM | POA: Diagnosis not present

## 2012-02-14 DIAGNOSIS — C44519 Basal cell carcinoma of skin of other part of trunk: Secondary | ICD-10-CM | POA: Diagnosis not present

## 2012-02-14 DIAGNOSIS — D485 Neoplasm of uncertain behavior of skin: Secondary | ICD-10-CM | POA: Diagnosis not present

## 2012-03-14 ENCOUNTER — Ambulatory Visit: Payer: Medicare Other | Admitting: Cardiology

## 2012-03-17 DIAGNOSIS — C44319 Basal cell carcinoma of skin of other parts of face: Secondary | ICD-10-CM | POA: Diagnosis not present

## 2012-04-07 ENCOUNTER — Ambulatory Visit (INDEPENDENT_AMBULATORY_CARE_PROVIDER_SITE_OTHER): Payer: Medicare Other | Admitting: Cardiology

## 2012-04-07 ENCOUNTER — Encounter: Payer: Self-pay | Admitting: Cardiology

## 2012-04-07 VITALS — BP 159/58 | HR 66 | Ht 72.0 in | Wt 254.0 lb

## 2012-04-07 DIAGNOSIS — I2510 Atherosclerotic heart disease of native coronary artery without angina pectoris: Secondary | ICD-10-CM

## 2012-04-07 DIAGNOSIS — I10 Essential (primary) hypertension: Secondary | ICD-10-CM | POA: Diagnosis not present

## 2012-04-07 DIAGNOSIS — E7800 Pure hypercholesterolemia, unspecified: Secondary | ICD-10-CM

## 2012-04-07 NOTE — Patient Instructions (Addendum)
Your physician wants you to follow-up in: 6 MONTHS WITH DR Jens Som You will receive a reminder letter in the mail two months in advance. If you don't receive a letter, please call our office to schedule the follow-up appointment.   STOP CRESTOR AFTER 4 WEEKS CALL AND LET ME KNOW IF SYMPTOMS HAVE IMPROVED

## 2012-04-07 NOTE — Progress Notes (Signed)
HPI: Pleasant male for fu of CAD. He was admitted 10/12 with chest pain . Myoview 04/16/11: mild apical ischemia, study not gated. Cath in Nov 2012 revealed normal LM. The LAD demonstrated a long 70-75% diffuse stenosis prior to the bifurcation. It was calcified. The diagonal had a 75% stenosis and there were tandem distal LAD lesions of 70-75%, with the vessel appearing typical for diabetes. The circumflex had a tiny ramus without disease, a moderately large OM1 with a 75% area of proximal disease. The AV vessel, leading to the OM2 had tandem 50% lesions. Both vessels appeared diabetic, but graftable. The RCA has a prox mid lesion of 60-70% . The PDA divides into two branches, and the larger branch has an 80% hypodense area of disease. This vessel is also graftable. EF 60. Dr Riley Kill felt patient should have either medical therapy or CABG and not PCI. I reviewed films with Dr Antoine Poche and we elected medical therapy. Since he was last seen in Feb 2013, he has dyspnea with exertion but no orthopnea, PND, pedal edema, palpitations, syncope or chest pain. He has noticed pain in his hips and occasional thighs.   Current Outpatient Prescriptions  Medication Sig Dispense Refill  . aspirin 81 MG tablet Take 81 mg by mouth daily.        . beclomethasone (BECONASE AQ) 42 MCG/SPRAY nasal spray Place 1 spray into the nose daily. Dose is for each nostril.       . cetirizine (ZYRTEC) 10 MG tablet TAKE ONE TABLET BY MOUTH EVERY DAY  90 tablet  3  . Flax OIL Take 1 capsule by mouth daily.        Marland Kitchen glipiZIDE (GLUCOTROL) 10 MG tablet Take 1 tablet (10 mg total) by mouth 2 (two) times daily before a meal.  30 tablet  11  . glucose blood (FREESTYLE LITE) test strip Check blood sugar twice daily and as needed. 250.0  100 each    . hydrochlorothiazide (HYDRODIURIL) 25 MG tablet TAKE ONE TABLET BY MOUTH EVERY DAY  90 tablet  3  . Lancets (FREESTYLE) lancets Check glucose once daily and as needed.250.0       . lisinopril  (PRINIVIL,ZESTRIL) 20 MG tablet Take 1 tablet (20 mg total) by mouth daily.  90 tablet  4  . metFORMIN (GLUCOPHAGE) 1000 MG tablet TAKE ONE TABLET BY MOUTH TWICE DAILY  180 tablet  3  . nitroGLYCERIN (NITROSTAT) 0.4 MG SL tablet Place 1 tablet (0.4 mg total) under the tongue every 5 (five) minutes as needed for chest pain.  25 tablet  6  . Omega-3 Fatty Acids (FISH OIL PO) Take 2 capsules by mouth daily.        . ranitidine (ZANTAC) 150 MG tablet Take 150 mg by mouth 2 (two) times daily as needed.        . rosuvastatin (CRESTOR) 40 MG tablet Take 1 tablet (40 mg total) by mouth at bedtime.  90 tablet  4     Past Medical History  Diagnosis Date  . Allergy     allergic rhinitis  . Hx of skin cancer, basal cell     many skin cancers removed/under constant treatment.  . Diabetes mellitus     type II x8 years  . GERD (gastroesophageal reflux disease)   . Hypertension   . Arthritis     OA  . BPH (benign prostatic hyperplasia)     microwave tx of prostate  . AC (acromioclavicular) joint bone spurs  lt ankle  . HLD (hyperlipidemia)     10/12: TC 208, TG 213, HDL 36, LDL 129  . CAD (coronary artery disease)     Myoview 04/16/11: mild apical ischemia, study not gated.    . Scoliosis   . Chronic upper back pain   . Obstructive sleep apnea on CPAP     Past Surgical History  Procedure Date  . Breast surgery 2009    breast lump removed benign    History   Social History  . Marital Status: Married    Spouse Name: N/A    Number of Children: N/A  . Years of Education: N/A   Occupational History  . Not on file.   Social History Main Topics  . Smoking status: Never Smoker   . Smokeless tobacco: Never Used  . Alcohol Use: No  . Drug Use: No  . Sexually Active: Not on file   Other Topics Concern  . Not on file   Social History Narrative   Married Physically active hard of hearing    ROS: some arthralgias but no fevers or chills, productive cough, hemoptysis, dysphasia,  odynophagia, melena, hematochezia, dysuria, hematuria, rash, seizure activity, orthopnea, PND, pedal edema, claudication. Remaining systems are negative.  Physical Exam: Well-developed well-nourished in no acute distress.  Skin is warm and dry.  HEENT is normal.  Neck is supple.  Chest is clear to auscultation with normal expansion.  Cardiovascular exam is regular rate and rhythm.  Abdominal exam nontender or distended. No masses palpated. Extremities show no edema. neuro grossly intact  ECG sinus rhythm at a rate of 66. Atrial bigeminy. Axis normal. No significant ST changes.

## 2012-04-07 NOTE — Assessment & Plan Note (Signed)
Patient without chest pain. I have previously reviewed the patient's cath films with Dr Stuckey and Dr Hochrein. He has significant diffuse coronary disease. Some of his distal vessels, particularly the LAD, are small and may be difficult to graft. We therefore plan medical therapy for now. We can consider coronary artery bypass and graft in the future if he has refractory symptoms. Continue aspirin.   

## 2012-04-07 NOTE — Assessment & Plan Note (Signed)
He is having some hip pain and proximal leg pain. This may be related to Crestor. I will discontinue for 4 weeks and if his symptoms do not improve we will resume. If his symptoms do improve  We will continue off of Crestor and try a different statin such as Pravachol. He did not tolerate Lipitor in the past.

## 2012-04-07 NOTE — Assessment & Plan Note (Signed)
Blood pressure mildly elevated he states typically controlled. He will follow this at home and we will increase lisinopril if needed.

## 2012-05-07 ENCOUNTER — Telehealth: Payer: Self-pay | Admitting: Cardiology

## 2012-05-07 NOTE — Telephone Encounter (Signed)
FYI: pt wife called to report patient is doing good on the new Crestor medication.  plz return call to advise on lowering dosage at hm#

## 2012-05-07 NOTE — Telephone Encounter (Signed)
Left pts wife a message to call back

## 2012-05-07 NOTE — Telephone Encounter (Signed)
Patient's wife called to let Dr. Jens Som know that pt is doing well. Pt C/O of  pain and Tremors in LE. Also C/O of being tire and weakness when he was in the office visit on 04/07/12. Dr. Jens Som recommended to hold crestor 40 mg at bedtime  Pt  to call back in four weeks to see how he was doing. Patient's wife states pt is doing well. Pt's symptoms are gone away. Pt and wife would like to know what medication Md recommends for pt to take in place of Crestor or if  MD is lowering the Crestor dose.

## 2012-05-07 NOTE — Telephone Encounter (Signed)
Dc crestor and treat with pravachol 40 mg daily; lipids and liver in six weeks Olga Millers

## 2012-05-08 NOTE — Telephone Encounter (Signed)
Left message for pt to call.

## 2012-05-09 MED ORDER — PRAVASTATIN SODIUM 40 MG PO TABS: 40.0000 mg | ORAL_TABLET | Freq: Every evening | ORAL | Status: DC

## 2012-05-09 NOTE — Telephone Encounter (Signed)
Follow-up: ° ° ° °Patient's wife returned your call.  Please call back. °

## 2012-05-09 NOTE — Telephone Encounter (Signed)
Left message for wife of dr Ludwig Clarks recommendations. Script sent to pharm on record. Wife to call with questions.

## 2012-05-24 DIAGNOSIS — H169 Unspecified keratitis: Secondary | ICD-10-CM | POA: Diagnosis not present

## 2012-05-28 ENCOUNTER — Other Ambulatory Visit: Payer: Self-pay | Admitting: Family Medicine

## 2012-06-16 DIAGNOSIS — C44319 Basal cell carcinoma of skin of other parts of face: Secondary | ICD-10-CM | POA: Diagnosis not present

## 2012-06-16 DIAGNOSIS — Z85828 Personal history of other malignant neoplasm of skin: Secondary | ICD-10-CM | POA: Diagnosis not present

## 2012-06-16 DIAGNOSIS — D485 Neoplasm of uncertain behavior of skin: Secondary | ICD-10-CM | POA: Diagnosis not present

## 2012-06-16 DIAGNOSIS — L570 Actinic keratosis: Secondary | ICD-10-CM | POA: Diagnosis not present

## 2012-06-27 DIAGNOSIS — C4441 Basal cell carcinoma of skin of scalp and neck: Secondary | ICD-10-CM | POA: Diagnosis not present

## 2012-06-27 DIAGNOSIS — C44319 Basal cell carcinoma of skin of other parts of face: Secondary | ICD-10-CM | POA: Diagnosis not present

## 2012-07-07 DIAGNOSIS — C44319 Basal cell carcinoma of skin of other parts of face: Secondary | ICD-10-CM | POA: Diagnosis not present

## 2012-07-15 ENCOUNTER — Telehealth: Payer: Self-pay | Admitting: Family Medicine

## 2012-07-15 DIAGNOSIS — I10 Essential (primary) hypertension: Secondary | ICD-10-CM

## 2012-07-15 DIAGNOSIS — K219 Gastro-esophageal reflux disease without esophagitis: Secondary | ICD-10-CM

## 2012-07-15 DIAGNOSIS — E119 Type 2 diabetes mellitus without complications: Secondary | ICD-10-CM

## 2012-07-15 DIAGNOSIS — R609 Edema, unspecified: Secondary | ICD-10-CM

## 2012-07-15 DIAGNOSIS — E7800 Pure hypercholesterolemia, unspecified: Secondary | ICD-10-CM

## 2012-07-15 DIAGNOSIS — Z125 Encounter for screening for malignant neoplasm of prostate: Secondary | ICD-10-CM | POA: Insufficient documentation

## 2012-07-15 NOTE — Telephone Encounter (Signed)
Message copied by Judy Pimple on Tue Jul 15, 2012  9:00 PM ------      Message from: Alvina Chou      Created: Fri Jul 11, 2012 11:51 AM      Regarding: lab orders for Wednesday, 1.15.14       Patient is scheduled for CPX labs, please order future labs, Thanks , Camelia Eng

## 2012-07-16 ENCOUNTER — Other Ambulatory Visit (INDEPENDENT_AMBULATORY_CARE_PROVIDER_SITE_OTHER): Payer: Medicare Other

## 2012-07-16 DIAGNOSIS — K219 Gastro-esophageal reflux disease without esophagitis: Secondary | ICD-10-CM | POA: Diagnosis not present

## 2012-07-16 DIAGNOSIS — E7800 Pure hypercholesterolemia, unspecified: Secondary | ICD-10-CM

## 2012-07-16 DIAGNOSIS — R609 Edema, unspecified: Secondary | ICD-10-CM

## 2012-07-16 DIAGNOSIS — E119 Type 2 diabetes mellitus without complications: Secondary | ICD-10-CM

## 2012-07-16 DIAGNOSIS — I10 Essential (primary) hypertension: Secondary | ICD-10-CM

## 2012-07-16 LAB — COMPREHENSIVE METABOLIC PANEL
ALT: 22 U/L (ref 0–53)
AST: 17 U/L (ref 0–37)
Albumin: 4 g/dL (ref 3.5–5.2)
Alkaline Phosphatase: 80 U/L (ref 39–117)
Calcium: 9.1 mg/dL (ref 8.4–10.5)
Chloride: 106 mEq/L (ref 96–112)
Potassium: 4.3 mEq/L (ref 3.5–5.1)
Sodium: 139 mEq/L (ref 135–145)
Total Protein: 7.1 g/dL (ref 6.0–8.3)

## 2012-07-16 LAB — CBC WITH DIFFERENTIAL/PLATELET
Basophils Absolute: 0 10*3/uL (ref 0.0–0.1)
Basophils Relative: 0.5 % (ref 0.0–3.0)
Eosinophils Absolute: 0.2 10*3/uL (ref 0.0–0.7)
Lymphocytes Relative: 23 % (ref 12.0–46.0)
MCHC: 33.4 g/dL (ref 30.0–36.0)
Neutrophils Relative %: 62.2 % (ref 43.0–77.0)
Platelets: 174 10*3/uL (ref 150.0–400.0)
RBC: 4.57 Mil/uL (ref 4.22–5.81)
RDW: 15.1 % — ABNORMAL HIGH (ref 11.5–14.6)

## 2012-07-16 LAB — LIPID PANEL
LDL Cholesterol: 90 mg/dL (ref 0–99)
Total CHOL/HDL Ratio: 5
VLDL: 21.6 mg/dL (ref 0.0–40.0)

## 2012-07-16 LAB — TSH: TSH: 3.34 u[IU]/mL (ref 0.35–5.50)

## 2012-07-23 ENCOUNTER — Ambulatory Visit (INDEPENDENT_AMBULATORY_CARE_PROVIDER_SITE_OTHER): Payer: Medicare Other | Admitting: Family Medicine

## 2012-07-23 ENCOUNTER — Encounter: Payer: Self-pay | Admitting: Family Medicine

## 2012-07-23 VITALS — BP 142/68 | HR 70 | Temp 98.4°F | Ht 72.0 in | Wt 252.0 lb

## 2012-07-23 DIAGNOSIS — Z23 Encounter for immunization: Secondary | ICD-10-CM | POA: Diagnosis not present

## 2012-07-23 DIAGNOSIS — E669 Obesity, unspecified: Secondary | ICD-10-CM | POA: Diagnosis not present

## 2012-07-23 DIAGNOSIS — I10 Essential (primary) hypertension: Secondary | ICD-10-CM

## 2012-07-23 DIAGNOSIS — E7800 Pure hypercholesterolemia, unspecified: Secondary | ICD-10-CM | POA: Diagnosis not present

## 2012-07-23 DIAGNOSIS — E119 Type 2 diabetes mellitus without complications: Secondary | ICD-10-CM

## 2012-07-23 NOTE — Assessment & Plan Note (Signed)
Is on pravastatin - he did not tolerate simvastatin  Sugars are higher  Chol is fair - disc need to inc HDL  Will continue to follow

## 2012-07-23 NOTE — Assessment & Plan Note (Signed)
bp is stable today  No cp or palpitations or headaches or edema  No side effects to medicines  BP Readings from Last 3 Encounters:  07/23/12 142/68  04/07/12 159/58  01/21/12 120/70     bp is overall stalbe -no change in tx

## 2012-07-23 NOTE — Patient Instructions (Addendum)
If you are interested in a shingles/zoster vaccine - call your insurance to check on coverage,( you should not get it within 1 month of other vaccines) , then call us for a prescription  for it to take to a pharmacy that gives the shot , or make a nurse visit to get it here depending on your coverave   Don't forget to schedule your yearly eye exam  Schedule follow up for diabetes with labs prior in 3 months  Flu shot today

## 2012-07-23 NOTE — Assessment & Plan Note (Signed)
DM is worse with a1c of 7.9 Pt aware -needs to loose wt and eat better diet/ exercise more-unsure how motivated he is He declines injectable med of any kind incl insulin  Will re check in 3 mo and f/u  I suspect in this pt statins also raise sugar somewhat

## 2012-07-23 NOTE — Progress Notes (Signed)
Subjective:    Patient ID: Ryan Garcia, male    DOB: [birth year], 79 y.o.   MRN: [id number]  HPI Here for check up of chronic medical conditions and to review health mt list   Is feeling good  Is taking care of himself   Wt is down 2 lb with bmi of 34   Zoster status-had illness Will call his insurance about coverage  Does not want to get shingles again - it was severe    Colonoscopy 2/12- is up to date   Prostate- no particular problems / his stream is not as strong as it used to be  No sex drive at all  He is not interested in screening    bp is stable today  No cp or palpitations or headaches or edema  No side effects to medicines  BP Readings from Last 3 Encounters:  07/23/12 142/68  04/07/12 159/58  01/21/12 120/70       Dm Diabetes Home sugar results - (these labs 206 random)  At home - better this mo than last month , he does have some 200s   DM diet - is avoiding sweets as a rule - if any- just a taste , and does not watch starches very well  Exercise - is walking - 20-25 minutes 2-3 times per week (not if it is cold)  Symptoms A1C last  Lab Results  Component Value Date   HGBA1C 7.9* 07/16/2012  up from 7.2 Pt declines insulin and other injectible medicines  No problems with medications -on glipizide and metformin Renal protection-on ace  Last eye exam   Hyperlipidemia with CAD Lab Results  Component Value Date   CHOL 143 07/16/2012   CHOL 108 06/28/2011   CHOL 208* 04/07/2011   Lab Results  Component Value Date   HDL 31.60* 07/16/2012   HDL 42.30 06/28/2011   HDL 36* 04/07/2011   Lab Results  Component Value Date   LDLCALC 90 07/16/2012   LDLCALC 46 06/28/2011   LDLCALC 129* 04/07/2011   Lab Results  Component Value Date   TRIG 108.0 07/16/2012   TRIG 99.0 06/28/2011   TRIG 213* 04/07/2011   Lab Results  Component Value Date   CHOLHDL 5 07/16/2012   CHOLHDL 3 06/28/2011   CHOLHDL 5.8 04/07/2011   Lab Results  Component Value Date   LDLDIRECT 156.5 05/09/2010   LDLDIRECT 152.9 08/09/2009   LDLDIRECT 147.1 02/26/2008   on pravastatin - is starting to get a little stiff  Did not tolerate simvastain     Chemistry      Component Value Date/Time   NA 139 07/16/2012 0931   K 4.3 07/16/2012 0931   CL 106 07/16/2012 0931   CO2 27 07/16/2012 0931   BUN 25* 07/16/2012 0931   CREATININE 1.3 07/16/2012 0931      Component Value Date/Time   CALCIUM 9.1 07/16/2012 0931   ALKPHOS 80 07/16/2012 0931   AST 17 07/16/2012 0931   ALT 22 07/16/2012 0931   BILITOT 0.9 07/16/2012 0931            Patient Active Problem List  Diagnosis  . DIABETES MELLITUS, TYPE II  . HYPERCHOLESTEROLEMIA  . OBESITY  . ERECTILE DYSFUNCTION  . HYPERTENSION  . ALLERGIC RHINITIS  . GERD  . OSTEOARTHRITIS  . BACK PAIN  . SLEEP APNEA  . EDEMA  . URINARY INCONTINENCE  . SKIN CANCER, HX OF  . Upper back pain, chronic  . Abnormal EKG  .  Left arm pain  . Shingles  . Arm pain, left  . Decreased hearing  . Chest pain  . Abnormal Stress Myoview  . Shortness of breath  . CAD (coronary artery disease)  . Cough  . Abnormal chest x-ray  . Prostate cancer screening   Past Medical History  Diagnosis Date  . Allergy     allergic rhinitis  . Hx of skin cancer, basal cell     many skin cancers removed/under constant treatment.  . Diabetes mellitus     type II x8 years  . GERD (gastroesophageal reflux disease)   . Hypertension   . Arthritis     OA  . BPH (benign prostatic hyperplasia)     microwave tx of prostate  . AC (acromioclavicular) joint bone spurs     lt ankle  . HLD (hyperlipidemia)     10/12: TC 208, TG 213, HDL 36, LDL 129  . CAD (coronary artery disease)     Myoview 04/16/11: mild apical ischemia, study not gated.    . Scoliosis   . Chronic upper back pain   . Obstructive sleep apnea on CPAP    Past Surgical History  Procedure Date  . Breast surgery 2009    breast lump removed benign   History  Substance Use Topics  .  Smoking status: Never Smoker   . Smokeless tobacco: Never Used  . Alcohol Use: No   Family History  Problem Relation Age of Onset  . Heart attack Brother    Allergies  Allergen Reactions  . Loratadine     REACTION: not effective  . Simvastatin     REACTION: intolerant   Current Outpatient Prescriptions on File Prior to Visit  Medication Sig Dispense Refill  . aspirin 81 MG tablet Take 81 mg by mouth daily.        Marland Kitchen BECONASE AQ 42 MCG/SPRAY nasal spray APPLY ONE SPRAY INTO EACH NOSTRIL EACH DAY  25 g  1  . cetirizine (ZYRTEC) 10 MG tablet TAKE ONE TABLET BY MOUTH EVERY DAY  90 tablet  3  . Flax OIL Take 1 capsule by mouth daily.        Marland Kitchen glipiZIDE (GLUCOTROL) 10 MG tablet Take 1 tablet (10 mg total) by mouth 2 (two) times daily before a meal.  30 tablet  11  . glucose blood (FREESTYLE LITE) test strip Check blood sugar twice daily and as needed. 250.0  100 each    . hydrochlorothiazide (HYDRODIURIL) 25 MG tablet TAKE ONE TABLET BY MOUTH EVERY DAY  90 tablet  3  . Lancets (FREESTYLE) lancets Check glucose once daily and as needed.250.0       . lisinopril (PRINIVIL,ZESTRIL) 20 MG tablet Take 1 tablet (20 mg total) by mouth daily.  90 tablet  4  . metFORMIN (GLUCOPHAGE) 1000 MG tablet TAKE ONE TABLET BY MOUTH TWICE DAILY  180 tablet  3  . nitroGLYCERIN (NITROSTAT) 0.4 MG SL tablet Place 1 tablet (0.4 mg total) under the tongue every 5 (five) minutes as needed for chest pain.  25 tablet  6  . Omega-3 Fatty Acids (FISH OIL PO) Take 2 capsules by mouth daily.        . pravastatin (PRAVACHOL) 40 MG tablet Take 1 tablet (40 mg total) by mouth every evening.  30 tablet  11  . ranitidine (ZANTAC) 150 MG tablet Take 150 mg by mouth 2 (two) times daily as needed.          Review of  Systems Review of Systems  Constitutional: Negative for fever, appetite change, fatigue and unexpected weight change.  Eyes: Negative for pain and visual disturbance.  Respiratory: Negative for cough and shortness  of breath.   Cardiovascular: Negative for cp or palpitations    Gastrointestinal: Negative for nausea, diarrhea and constipation.  Genitourinary: Negative for urgency and frequency. neg for excessive thirst Skin: Negative for pallor or rash   MSK pos for some generalized aches and pains  Neurological: Negative for weakness, light-headedness, numbness and headaches.  Hematological: Negative for adenopathy. Does not bruise/bleed easily.  Psychiatric/Behavioral: Negative for dysphoric mood. The patient is not nervous/anxious.         Objective:   Physical Exam  Constitutional: He appears well-developed and well-nourished. No distress.       obese and well appearing   HENT:  Head: Normocephalic and atraumatic.  Right Ear: External ear normal.  Left Ear: External ear normal.  Mouth/Throat: Oropharynx is clear and moist. No oropharyngeal exudate.       Scant cerumen  Eyes: Conjunctivae normal and EOM are normal. Pupils are equal, round, and reactive to light. No scleral icterus.  Neck: Normal range of motion. Neck supple. No JVD present. Carotid bruit is not present. No thyromegaly present.  Cardiovascular: Normal rate, regular rhythm, normal heart sounds and intact distal pulses.  Exam reveals no gallop.   Pulmonary/Chest: Effort normal and breath sounds normal. No respiratory distress. He has no wheezes.  Abdominal: Soft. Bowel sounds are normal. He exhibits no distension, no abdominal bruit and no mass. There is no tenderness.  Musculoskeletal: He exhibits no edema and no tenderness.  Lymphadenopathy:    He has no cervical adenopathy.  Neurological: He is alert. He has normal reflexes. No cranial nerve deficit. He exhibits normal muscle tone. Coordination normal.  Skin: Skin is warm and dry. No rash noted. No erythema. No pallor.  Psychiatric: He has a normal mood and affect.          Assessment & Plan:

## 2012-07-23 NOTE — Assessment & Plan Note (Signed)
Discussed how this problem influences overall health and the risks it imposes  Reviewed plan for weight loss with lower calorie diet (via better food choices and also portion control or program like weight watchers) and exercise building up to or more than 30 minutes 5 days per week including some aerobic activity   Pt does not seem very motivated

## 2012-08-05 DIAGNOSIS — C44621 Squamous cell carcinoma of skin of unspecified upper limb, including shoulder: Secondary | ICD-10-CM | POA: Diagnosis not present

## 2012-08-28 ENCOUNTER — Telehealth: Payer: Self-pay | Admitting: Cardiology

## 2012-08-28 NOTE — Telephone Encounter (Signed)
Spoke with pt, he was changed from crestor to pravastatin due to muscle aching and joint pain. He now reports the pravastatin is causing the same problems. He will stop the pravastatin for 4 weeks. If no change in symptoms he will restart, if feeling better okay given for pt to stop the pravastatin. Pt agreed with this plan.

## 2012-08-28 NOTE — Telephone Encounter (Signed)
Pt calling re a medicine that's "messing him up" pls advise

## 2012-09-02 ENCOUNTER — Encounter (INDEPENDENT_AMBULATORY_CARE_PROVIDER_SITE_OTHER): Payer: Medicare Other

## 2012-09-08 DIAGNOSIS — E119 Type 2 diabetes mellitus without complications: Secondary | ICD-10-CM | POA: Diagnosis not present

## 2012-09-08 LAB — HM DIABETES EYE EXAM

## 2012-09-18 ENCOUNTER — Other Ambulatory Visit: Payer: Self-pay | Admitting: Family Medicine

## 2012-10-06 ENCOUNTER — Encounter: Payer: Self-pay | Admitting: Cardiology

## 2012-10-06 ENCOUNTER — Ambulatory Visit (INDEPENDENT_AMBULATORY_CARE_PROVIDER_SITE_OTHER): Payer: Medicare Other | Admitting: Cardiology

## 2012-10-06 VITALS — BP 136/60 | HR 55 | Wt 247.0 lb

## 2012-10-06 DIAGNOSIS — E7800 Pure hypercholesterolemia, unspecified: Secondary | ICD-10-CM

## 2012-10-06 DIAGNOSIS — I2510 Atherosclerotic heart disease of native coronary artery without angina pectoris: Secondary | ICD-10-CM

## 2012-10-06 MED ORDER — PRAVASTATIN SODIUM 40 MG PO TABS: 40.0000 mg | ORAL_TABLET | Freq: Every evening | ORAL | Status: DC

## 2012-10-06 NOTE — Assessment & Plan Note (Signed)
Patient without chest pain. I have previously reviewed the patient's cath films with Dr Riley Kill and Dr Antoine Poche. He has significant diffuse coronary disease. Some of his distal vessels, particularly the LAD, are small and may be difficult to graft. We therefore plan medical therapy for now. We can consider coronary artery bypass and graft in the future if he has refractory symptoms. Continue aspirin.

## 2012-10-06 NOTE — Assessment & Plan Note (Signed)
Patient has not tolerated Crestor or Lipitor in the past. He recently discontinued his pravastatin because of tightness in his hands. We will resume Pravachol and he will take coenzyme Q as well. Hopefully he will tolerate. Check lipids, liver and CK 6 weeks after reinitiating. Will discontinue if he has worsening symptoms.

## 2012-10-06 NOTE — Patient Instructions (Signed)
Your physician wants you to follow-up in: 6 MONTHS WITH DR Jens Som You will receive a reminder letter in the mail two months in advance. If you don't receive a letter, please call our office to schedule the follow-up appointment.   RESTART PRAVASTATIN 40 MG ONCE DAILY  Your physician recommends that you return for lab work in: IN 6 WEEKS = DO NOT EAT PRIOR TO LAB WORK

## 2012-10-06 NOTE — Progress Notes (Signed)
HPI: Pleasant male for fu of CAD. He was admitted 10/12 with chest pain . Myoview 04/16/11: mild apical ischemia, study not gated. Cath in Nov 2012 revealed normal LM. The LAD demonstrated a long 70-75% diffuse stenosis prior to the bifurcation. It was calcified. The diagonal had a 75% stenosis and there were tandem distal LAD lesions of 70-75%, with the vessel appearing typical for diabetes. The circumflex had a tiny ramus without disease, a moderately large OM1 with a 75% area of proximal disease. The AV vessel, leading to the OM2 had tandem 50% lesions. Both vessels appeared diabetic, but graftable. The RCA has a prox mid lesion of 60-70% . The PDA divides into two branches, and the larger branch has an 80% hypodense area of disease. This vessel is also graftable. EF 60. Dr Riley Kill felt patient should have either medical therapy or CABG and not PCI. I reviewed films with Dr Antoine Poche and we elected medical therapy. Since he was last seen in Oct 2013, he has dyspnea with exertion but no orthopnea, PND, pedal edema, palpitations, syncope or chest pain. He has noticed pain in his hips and occasional thighs.   Current Outpatient Prescriptions  Medication Sig Dispense Refill  . aspirin 81 MG tablet Take 81 mg by mouth daily.        Marland Kitchen BECONASE AQ 42 MCG/SPRAY nasal spray APPLY ONE SPRAY INTO EACH NOSTRIL EACH DAY  25 g  1  . cetirizine (ZYRTEC) 10 MG tablet TAKE ONE TABLET BY MOUTH EVERY DAY  90 tablet  3  . Flax OIL Take 1 capsule by mouth daily.        Marland Kitchen glipiZIDE (GLUCOTROL) 10 MG tablet Take 1 tablet (10 mg total) by mouth 2 (two) times daily before a meal.  30 tablet  11  . glucose blood (FREESTYLE LITE) test strip Check blood sugar twice daily and as needed. 250.0  100 each    . hydrochlorothiazide (HYDRODIURIL) 25 MG tablet TAKE ONE TABLET BY MOUTH EVERY DAY  90 tablet  0  . Lancets (FREESTYLE) lancets Check glucose once daily and as needed.250.0       . lisinopril (PRINIVIL,ZESTRIL) 20 MG tablet  Take 1 tablet (20 mg total) by mouth daily.  90 tablet  4  . metFORMIN (GLUCOPHAGE) 1000 MG tablet TAKE ONE TABLET BY MOUTH TWICE DAILY  180 tablet  3  . nitroGLYCERIN (NITROSTAT) 0.4 MG SL tablet Place 1 tablet (0.4 mg total) under the tongue every 5 (five) minutes as needed for chest pain.  25 tablet  6  . Omega-3 Fatty Acids (FISH OIL PO) Take 2 capsules by mouth daily.        . ranitidine (ZANTAC) 150 MG tablet Take 150 mg by mouth 2 (two) times daily as needed.         No current facility-administered medications for this visit.     Past Medical History  Diagnosis Date  . Allergy     allergic rhinitis  . Hx of skin cancer, basal cell     many skin cancers removed/under constant treatment.  . Diabetes mellitus     type II x8 years  . GERD (gastroesophageal reflux disease)   . Hypertension   . Arthritis     OA  . BPH (benign prostatic hyperplasia)     microwave tx of prostate  . AC (acromioclavicular) joint bone spurs     lt ankle  . HLD (hyperlipidemia)     10/12: TC 208, TG 213, HDL  36, LDL 129  . CAD (coronary artery disease)     Myoview 04/16/11: mild apical ischemia, study not gated.    . Scoliosis   . Chronic upper back pain   . Obstructive sleep apnea on CPAP     Past Surgical History  Procedure Laterality Date  . Breast surgery  2009    breast lump removed benign    History   Social History  . Marital Status: Married    Spouse Name: N/A    Number of Children: N/A  . Years of Education: N/A   Occupational History  . Not on file.   Social History Main Topics  . Smoking status: Never Smoker   . Smokeless tobacco: Never Used  . Alcohol Use: No  . Drug Use: No  . Sexually Active: Not on file   Other Topics Concern  . Not on file   Social History Narrative   Married    Physically active hard of hearing    ROS: no fevers or chills, productive cough, hemoptysis, dysphasia, odynophagia, melena, hematochezia, dysuria, hematuria, rash, seizure  activity, orthopnea, PND, pedal edema, claudication. Remaining systems are negative.  Physical Exam: Well-developed well-nourished in no acute distress.  Skin is warm and dry.  HEENT is normal.  Neck is supple.  Chest is clear to auscultation with normal expansion.  Cardiovascular exam is regular rate and rhythm.  Abdominal exam nontender or distended. No masses palpated. Extremities show no edema. neuro grossly intact  ECG sinus bradycardia with PACs.

## 2012-10-06 NOTE — Assessment & Plan Note (Signed)
And continue present medications.

## 2012-10-13 ENCOUNTER — Other Ambulatory Visit: Payer: Self-pay | Admitting: Family Medicine

## 2012-10-15 ENCOUNTER — Telehealth: Payer: Self-pay

## 2012-10-15 NOTE — Telephone Encounter (Signed)
I cannot find last prior auth in epic to see what med he has failed or have not worked - ? In [employer] perhaps or ask pt ? I put this back in IN box, thanks

## 2012-10-15 NOTE — Telephone Encounter (Signed)
Prior auth required Beconase AQ; form on Dr Royden Purl shelf.

## 2012-10-16 ENCOUNTER — Other Ambulatory Visit: Payer: Medicare Other

## 2012-10-16 NOTE — Telephone Encounter (Signed)
Called pt and he said he has tried 2 other nasal sprays the 1st was over 10 years ago and he couldn't remember the name of it and it wasn't in centricity, but the other med was fluticasone nasal spray and it didn't work and caused pt to have nose bleeds, I printed the previous prior auth from [employer] and it didn't specify which med he has tried in the past, but I attached it with this prior auth and placed it back in your inbox

## 2012-10-17 NOTE — Telephone Encounter (Signed)
Done and in IN box 

## 2012-10-20 NOTE — Telephone Encounter (Signed)
PA faxed

## 2012-10-21 ENCOUNTER — Ambulatory Visit: Payer: Medicare Other | Admitting: Family Medicine

## 2012-10-22 ENCOUNTER — Encounter: Payer: Self-pay | Admitting: Cardiology

## 2012-10-22 NOTE — Telephone Encounter (Signed)
Denial letter received; faxed to [facility] and on Dr Royden Purl shelf for review.

## 2012-10-22 NOTE — Telephone Encounter (Signed)
Let him know they denied it this time - they will cover triamcinolone nasal spray (similar)- is he open to that?

## 2012-10-23 MED ORDER — TRIAMCINOLONE ACETONIDE(NASAL) 55 MCG/ACT NA INHA: 2.0000 | Freq: Every day | NASAL | Status: DC

## 2012-10-23 NOTE — Telephone Encounter (Signed)
Left voicemail requesting pt to call office 

## 2012-10-23 NOTE — Telephone Encounter (Signed)
Sent it to [facility]- hope it works out

## 2012-10-23 NOTE — Telephone Encounter (Signed)
Pt's wife notified med was denied is okay with trying triamcinolone, wife said if this one causes nose bleeds too she will call back and let us know, please send in Rx to [facility]

## 2012-10-24 ENCOUNTER — Encounter: Payer: Self-pay | Admitting: Family Medicine

## 2012-10-27 DIAGNOSIS — L570 Actinic keratosis: Secondary | ICD-10-CM | POA: Diagnosis not present

## 2012-10-27 DIAGNOSIS — Z85828 Personal history of other malignant neoplasm of skin: Secondary | ICD-10-CM | POA: Diagnosis not present

## 2012-11-10 ENCOUNTER — Other Ambulatory Visit: Payer: Self-pay | Admitting: Cardiology

## 2012-11-17 ENCOUNTER — Other Ambulatory Visit (INDEPENDENT_AMBULATORY_CARE_PROVIDER_SITE_OTHER): Payer: Medicare Other

## 2012-11-17 DIAGNOSIS — E7800 Pure hypercholesterolemia, unspecified: Secondary | ICD-10-CM

## 2012-11-17 DIAGNOSIS — I2510 Atherosclerotic heart disease of native coronary artery without angina pectoris: Secondary | ICD-10-CM | POA: Diagnosis not present

## 2012-11-17 DIAGNOSIS — I10 Essential (primary) hypertension: Secondary | ICD-10-CM | POA: Diagnosis not present

## 2012-11-17 DIAGNOSIS — E119 Type 2 diabetes mellitus without complications: Secondary | ICD-10-CM

## 2012-11-17 LAB — LIPID PANEL
Cholesterol: 130 mg/dL (ref 0–200)
Total CHOL/HDL Ratio: 4
Triglycerides: 306 mg/dL — ABNORMAL HIGH (ref 0.0–149.0)
VLDL: 61.2 mg/dL — ABNORMAL HIGH (ref 0.0–40.0)

## 2012-11-17 LAB — HEPATIC FUNCTION PANEL: Total Bilirubin: 0.4 mg/dL (ref 0.3–1.2)

## 2012-11-17 NOTE — Addendum Note (Signed)
Addended by: Baldomero Lamy on: 11/17/2012 09:36 AM   Modules accepted: Orders

## 2012-11-21 ENCOUNTER — Encounter: Payer: Self-pay | Admitting: Family Medicine

## 2012-11-21 ENCOUNTER — Ambulatory Visit (INDEPENDENT_AMBULATORY_CARE_PROVIDER_SITE_OTHER): Payer: Medicare Other | Admitting: Family Medicine

## 2012-11-21 VITALS — BP 124/82 | HR 62 | Temp 98.5°F | Ht 72.0 in | Wt 240.5 lb

## 2012-11-21 DIAGNOSIS — E119 Type 2 diabetes mellitus without complications: Secondary | ICD-10-CM

## 2012-11-21 DIAGNOSIS — I10 Essential (primary) hypertension: Secondary | ICD-10-CM | POA: Diagnosis not present

## 2012-11-21 DIAGNOSIS — E7800 Pure hypercholesterolemia, unspecified: Secondary | ICD-10-CM

## 2012-11-21 MED ORDER — ZOSTER VACCINE LIVE 19400 UNT/0.65ML ~~LOC~~ SOLR: 0.6500 mL | Freq: Once | SUBCUTANEOUS | Status: DC

## 2012-11-21 NOTE — Assessment & Plan Note (Signed)
Much improved with diet/ exercise and wt loss  Urged to keep up the good work F/u 6 mo

## 2012-11-21 NOTE — Assessment & Plan Note (Signed)
bp in fair control at this time  No changes needed  Disc lifstyle change with low sodium diet and exercise   Rev labs 

## 2012-11-21 NOTE — Progress Notes (Signed)
Subjective:    Patient ID: Ryan Garcia, male    DOB: 03-26-1934, 79 y.o.   MRN: [id number]  HPI Here for f/u of chronic health problems    Wt is down 7 lb with bmi still in obese range He has tried to eat better - when he eats out -no longer overeats Is more mobile since wt loss and also working outdoors for longer periods of time  Also some walking   bp is stable today  No cp or palpitations or headaches or edema  No side effects to medicines  BP Readings from Last 3 Encounters:  11/21/12 124/82  10/06/12 136/60  07/23/12 142/68     Hyperlipidemia On pravastatin-not causing any problems  Lab Results  Component Value Date   CHOL 130 11/17/2012   HDL 30.30* 11/17/2012   LDLCALC 90 07/16/2012   LDLDIRECT 78.5 11/17/2012   TRIG 306.0* 11/17/2012   CHOLHDL 4 11/17/2012    Has seen cardiology  Diabetes Home sugar results - running very well  Stays in the lower 120s , and some in 90s  No hypoglycemic DM diet - much better  Exercise -much better  Symptoms-none  A1C last  Lab Results  Component Value Date   HGBA1C 6.3 11/17/2012  this is much imp from over 7 prev   No problems with medications  Renal protection is on ace Last eye exam ok 3/14  Wants shingles vaccine today  Ins covers it - is cheaper in a pharmacy     Patient Active Problem List   Diagnosis Date Noted  . Prostate cancer screening 07/15/2012  . Abnormal chest x-ray 05/24/2011  . Cough 05/23/2011  . CAD (coronary artery disease) 05/17/2011  . Chest pain 04/30/2011  . Abnormal Stress Myoview 04/30/2011  . Shortness of breath 04/30/2011  . Shingles 04/14/2011  . Arm pain, left 04/14/2011  . Decreased hearing 04/14/2011  . Upper back pain, chronic 04/06/2011  . Abnormal EKG 04/06/2011  . Left arm pain 04/06/2011  . URINARY INCONTINENCE 05/15/2010  . BACK PAIN 01/13/2010  . DIABETES MELLITUS, TYPE II 10/04/2006  . HYPERCHOLESTEROLEMIA 10/04/2006  . OBESITY 10/04/2006  . ERECTILE DYSFUNCTION  10/04/2006  . HYPERTENSION 10/04/2006  . ALLERGIC RHINITIS 10/04/2006  . GERD 10/04/2006  . OSTEOARTHRITIS 10/04/2006  . SLEEP APNEA 10/04/2006  . EDEMA 10/04/2006  . SKIN CANCER, HX OF 10/04/2006   Past Medical History  Diagnosis Date  . Allergy     allergic rhinitis  . Hx of skin cancer, basal cell     many skin cancers removed/under constant treatment.  . Diabetes mellitus     type II x8 years  . GERD (gastroesophageal reflux disease)   . Hypertension   . Arthritis     OA  . BPH (benign prostatic hyperplasia)     microwave tx of prostate  . AC (acromioclavicular) joint bone spurs     lt ankle  . HLD (hyperlipidemia)     10/12: TC 208, TG 213, HDL 36, LDL 129  . CAD (coronary artery disease)     Myoview 04/16/11: mild apical ischemia, study not gated.    . Scoliosis   . Chronic upper back pain   . Obstructive sleep apnea on CPAP    Past Surgical History  Procedure Laterality Date  . Breast surgery  2009    breast lump removed benign   History  Substance Use Topics  . Smoking status: Never Smoker   . Smokeless tobacco: Never Used  .  Alcohol Use: No   Family History  Problem Relation Age of Onset  . Heart attack Brother    Allergies  Allergen Reactions  . Loratadine     REACTION: not effective  . Simvastatin     REACTION: intolerant   Current Outpatient Prescriptions on File Prior to Visit  Medication Sig Dispense Refill  . aspirin 81 MG tablet Take 81 mg by mouth daily.        . cetirizine (ZYRTEC) 10 MG tablet TAKE ONE TABLET BY MOUTH EVERY DAY  90 tablet  3  . Flax OIL Take 1 capsule by mouth daily.        Marland Kitchen glipiZIDE (GLUCOTROL) 10 MG tablet TAKE 1 TABLET (10 MG TOTAL) BY MOUTH 2 TIMES DAILY BEFORE MEALS.  60 tablet  1  . glucose blood (FREESTYLE LITE) test strip Check blood sugar twice daily and as needed. 250.0  100 each    . hydrochlorothiazide (HYDRODIURIL) 25 MG tablet TAKE ONE TABLET BY MOUTH EVERY DAY  90 tablet  0  . Lancets (FREESTYLE)  lancets Check glucose once daily and as needed.250.0       . lisinopril (PRINIVIL,ZESTRIL) 20 MG tablet TAKE ONE TABLET BY MOUTH EVERY DAY  90 tablet  3  . metFORMIN (GLUCOPHAGE) 1000 MG tablet TAKE ONE TABLET BY MOUTH TWICE DAILY  180 tablet  3  . nitroGLYCERIN (NITROSTAT) 0.4 MG SL tablet Place 1 tablet (0.4 mg total) under the tongue every 5 (five) minutes as needed for chest pain.  25 tablet  6  . Omega-3 Fatty Acids (FISH OIL PO) Take 2 capsules by mouth daily.        . pravastatin (PRAVACHOL) 40 MG tablet Take 1 tablet (40 mg total) by mouth every evening.  90 tablet  3  . ranitidine (ZANTAC) 150 MG tablet Take 150 mg by mouth 2 (two) times daily as needed.        . triamcinolone (NASACORT AQ) 55 MCG/ACT nasal inhaler Place 2 sprays into the nose daily.  1 Inhaler  12   No current facility-administered medications on file prior to visit.    Review of Systems Review of Systems  Constitutional: Negative for fever, appetite change, fatigue and unexpected weight change.  Eyes: Negative for pain and visual disturbance.  Respiratory: Negative for cough and shortness of breath.   Cardiovascular: Negative for cp or palpitations    Gastrointestinal: Negative for nausea, diarrhea and constipation.  Genitourinary: Negative for urgency and frequency.  Skin: Negative for pallor or rash   Neurological: Negative for weakness, light-headedness, numbness and headaches.  Hematological: Negative for adenopathy. Does not bruise/bleed easily.  Psychiatric/Behavioral: Negative for dysphoric mood. The patient is not nervous/anxious.         Objective:   Physical Exam  Constitutional: He appears well-developed and well-nourished. No distress.  obese and well appearing   HENT:  Head: Normocephalic and atraumatic.  Mouth/Throat: Oropharynx is clear and moist.  Hard of hearing   Eyes: Conjunctivae and EOM are normal. Pupils are equal, round, and reactive to light. Right eye exhibits no discharge. Left  eye exhibits no discharge. No scleral icterus.  Neck: Normal range of motion. Neck supple. No JVD present. Carotid bruit is not present. No thyromegaly present.  Cardiovascular: Normal rate, regular rhythm, normal heart sounds and intact distal pulses.  Exam reveals no gallop.   Pulmonary/Chest: Effort normal and breath sounds normal. No respiratory distress. He has no wheezes. He has no rales.  Abdominal: Soft. Bowel  sounds are normal. He exhibits no distension, no abdominal bruit and no mass. There is no tenderness.  Musculoskeletal: He exhibits no edema and no tenderness.  Lymphadenopathy:    He has no cervical adenopathy.  Neurological: He is alert. He has normal reflexes. No cranial nerve deficit. He exhibits normal muscle tone. Coordination normal.  Skin: Skin is warm and dry. No rash noted. No erythema. No pallor.  Psychiatric: He has a normal mood and affect.          Assessment & Plan:

## 2012-11-21 NOTE — Patient Instructions (Addendum)
Keep up the great work with weight loss - stay active and eat a healthy diet  Shingles vaccine today  Follow up in 6 months for an annual exam with labs prior

## 2012-11-21 NOTE — Assessment & Plan Note (Signed)
Disc goals for lipids and reasons to control them Rev labs with pt Rev low sat fat diet in detail On pravastatin from cardiology- some stiffness but coenzyme Q 10 has helped

## 2012-11-23 ENCOUNTER — Other Ambulatory Visit: Payer: Self-pay | Admitting: Family Medicine

## 2012-12-10 ENCOUNTER — Other Ambulatory Visit: Payer: Self-pay | Admitting: Family Medicine

## 2012-12-15 ENCOUNTER — Other Ambulatory Visit: Payer: Self-pay | Admitting: Family Medicine

## 2012-12-16 ENCOUNTER — Telehealth: Payer: Self-pay | Admitting: *Deleted

## 2012-12-16 NOTE — Telephone Encounter (Signed)
FYI: Pt's wife received a call from a [facility] saying they need to update pt's diabetic supply info, pt's wife gave them all his info thinking she was talking to [employer] supply company. When she found out that she was talking to a random company she hasn't heard of or had any dealing with she told them to cancel the request for diabetic supplies but they said they were going to send the request to you. If you get any forms from [facility] please deny any request they make, the only company pt uses is [employer] supply, I will be on the look out as well

## 2012-12-16 NOTE — Telephone Encounter (Signed)
Thanks for letting me know!

## 2013-04-07 ENCOUNTER — Encounter: Payer: Self-pay | Admitting: Cardiology

## 2013-04-07 ENCOUNTER — Ambulatory Visit (INDEPENDENT_AMBULATORY_CARE_PROVIDER_SITE_OTHER): Payer: Medicare Other | Admitting: Cardiology

## 2013-04-07 VITALS — BP 143/72 | HR 57 | Ht 72.0 in | Wt 246.0 lb

## 2013-04-07 DIAGNOSIS — I2510 Atherosclerotic heart disease of native coronary artery without angina pectoris: Secondary | ICD-10-CM

## 2013-04-07 DIAGNOSIS — E7800 Pure hypercholesterolemia, unspecified: Secondary | ICD-10-CM | POA: Diagnosis not present

## 2013-04-07 DIAGNOSIS — I10 Essential (primary) hypertension: Secondary | ICD-10-CM

## 2013-04-07 NOTE — Patient Instructions (Addendum)
Your physician wants you to follow-up in: 1 year  You will receive a reminder letter in the mail two months in advance. If you don't receive a letter, please call our office to schedule the follow-up appointment.  Your physician recommends that you continue on your current medications as directed. Please refer to the Current Medication list given to you today.  

## 2013-04-07 NOTE — Assessment & Plan Note (Signed)
Patient without chest pain. I have previously reviewed the patient's cath films with Dr Riley Kill and Dr Antoine Poche. He has significant diffuse coronary disease. Some of his distal vessels, particularly the LAD, are small and may be difficult to graft. We therefore plan medical therapy for now. We can consider coronary artery bypass and graft in the future if he has refractory symptoms. Continue aspirin And statin.

## 2013-04-07 NOTE — Assessment & Plan Note (Signed)
Continue statin. 

## 2013-04-07 NOTE — Assessment & Plan Note (Addendum)
Blood pressure controlled. Continue present medications. 

## 2013-04-07 NOTE — Progress Notes (Signed)
HPI: Pleasant male for fu of CAD. He was admitted 10/12 with chest pain . Myoview 04/16/11: mild apical ischemia, study not gated. Cath in Nov 2012 revealed normal LM. The LAD demonstrated a long 70-75% diffuse stenosis prior to the bifurcation. It was calcified. The diagonal had a 75% stenosis and there were tandem distal LAD lesions of 70-75%, with the vessel appearing typical for diabetes. The circumflex had a tiny ramus without disease, a moderately large OM1 with a 75% area of proximal disease. The AV vessel, leading to the OM2 had tandem 50% lesions. Both vessels appeared diabetic, but graftable. The RCA has a prox mid lesion of 60-70% . The PDA divides into two branches, and the larger branch has an 80% hypodense area of disease. This vessel is also graftable. EF 60. Dr Riley Kill felt patient should have either medical therapy or CABG and not PCI. I reviewed films with Dr Antoine Poche and we elected medical therapy. Since he was last seen in April 2014, the patient has dyspnea with more extreme activities but not with routine activities. It is relieved with rest. It is not associated with chest pain. There is no orthopnea, PND or pedal edema. There is no syncope or palpitations. There is no exertional chest pain.    Current Outpatient Prescriptions  Medication Sig Dispense Refill  . aspirin 81 MG tablet Take 81 mg by mouth daily.        . cetirizine (ZYRTEC) 10 MG tablet TAKE ONE TABLET BY MOUTH EVERY DAY  90 tablet  3  . Flax OIL Take 1 capsule by mouth daily.        Marland Kitchen glipiZIDE (GLUCOTROL) 10 MG tablet TAKE ONE TABLET BY MOUTH TWICE DAILY BEFORE MEALS  60 tablet  5  . glucose blood (FREESTYLE LITE) test strip Check blood sugar twice daily and as needed. 250.0  100 each    . hydrochlorothiazide (HYDRODIURIL) 25 MG tablet TAKE ONE TABLET BY MOUTH EVERY DAY  90 tablet  1  . Lancets (FREESTYLE) lancets Check glucose once daily and as needed.250.0       . lisinopril (PRINIVIL,ZESTRIL) 20 MG  tablet TAKE ONE TABLET BY MOUTH EVERY DAY  90 tablet  3  . metFORMIN (GLUCOPHAGE) 1000 MG tablet TAKE ONE TABLET BY MOUTH TWICE DAILY  180 tablet  1  . nitroGLYCERIN (NITROSTAT) 0.4 MG SL tablet Place 1 tablet (0.4 mg total) under the tongue every 5 (five) minutes as needed for chest pain.  25 tablet  6  . Omega-3 Fatty Acids (FISH OIL PO) Take 2 capsules by mouth daily.        . pravastatin (PRAVACHOL) 40 MG tablet Take 1 tablet (40 mg total) by mouth every evening.  90 tablet  3  . ranitidine (ZANTAC) 150 MG tablet Take 150 mg by mouth 2 (two) times daily as needed.        . zoster vaccine live, PF, (ZOSTAVAX) [address] UNT/0.65ML injection Inject 19,400 Units into the skin once.  1 each  0   No current facility-administered medications for this visit.     Past Medical History  Diagnosis Date  . Allergy     allergic rhinitis  . Hx of skin cancer, basal cell     many skin cancers removed/under constant treatment.  . Diabetes mellitus     type II x8 years  . GERD (gastroesophageal reflux disease)   . Hypertension   . Arthritis     OA  . BPH (  benign prostatic hyperplasia)     microwave tx of prostate  . AC (acromioclavicular) joint bone spurs     lt ankle  . HLD (hyperlipidemia)     10/12: TC 208, TG 213, HDL 36, LDL 129  . CAD (coronary artery disease)     Myoview 04/16/11: mild apical ischemia, study not gated.    . Scoliosis   . Chronic upper back pain   . Obstructive sleep apnea on CPAP     Past Surgical History  Procedure Laterality Date  . Breast surgery  2009    breast lump removed benign    History   Social History  . Marital Status: Married    Spouse Name: N/A    Number of Children: N/A  . Years of Education: N/A   Occupational History  . Not on file.   Social History Main Topics  . Smoking status: Never Smoker   . Smokeless tobacco: Never Used  . Alcohol Use: No  . Drug Use: No  . Sexual Activity: Not on file   Other Topics Concern  . Not on file    Social History Narrative   Married    Physically active hard of hearing    ROS: no fevers or chills, productive cough, hemoptysis, dysphasia, odynophagia, melena, hematochezia, dysuria, hematuria, rash, seizure activity, orthopnea, PND, pedal edema, claudication. Remaining systems are negative.  Physical Exam: Well-developed well-nourished in no acute distress.  Skin is warm and dry.  HEENT is normal.  Neck is supple.  Chest is clear to auscultation with normal expansion.  Cardiovascular exam is regular rate and rhythm.  Abdominal exam nontender or distended. No masses palpated. Extremities show no edema. neuro grossly intact  ECG sinus rhythm with occasional PAC. No ST changes.

## 2013-04-28 DIAGNOSIS — Z85828 Personal history of other malignant neoplasm of skin: Secondary | ICD-10-CM | POA: Diagnosis not present

## 2013-04-28 DIAGNOSIS — L570 Actinic keratosis: Secondary | ICD-10-CM | POA: Diagnosis not present

## 2013-04-28 DIAGNOSIS — D485 Neoplasm of uncertain behavior of skin: Secondary | ICD-10-CM | POA: Diagnosis not present

## 2013-05-10 ENCOUNTER — Emergency Department (HOSPITAL_COMMUNITY): Payer: Medicare Other

## 2013-05-10 ENCOUNTER — Encounter (HOSPITAL_COMMUNITY): Payer: Self-pay | Admitting: Emergency Medicine

## 2013-05-10 ENCOUNTER — Emergency Department (HOSPITAL_COMMUNITY)
Admission: EM | Admit: 2013-05-10 | Discharge: 2013-05-10 | Disposition: A | Discharge status: Home / Self Care | Payer: Medicare Other | Attending: Emergency Medicine | Admitting: Emergency Medicine

## 2013-05-10 DIAGNOSIS — M19019 Primary osteoarthritis, unspecified shoulder: Secondary | ICD-10-CM | POA: Diagnosis not present

## 2013-05-10 DIAGNOSIS — R52 Pain, unspecified: Secondary | ICD-10-CM | POA: Insufficient documentation

## 2013-05-10 DIAGNOSIS — G4733 Obstructive sleep apnea (adult) (pediatric): Secondary | ICD-10-CM | POA: Diagnosis not present

## 2013-05-10 DIAGNOSIS — Z8719 Personal history of other diseases of the digestive system: Secondary | ICD-10-CM | POA: Diagnosis not present

## 2013-05-10 DIAGNOSIS — Z7982 Long term (current) use of aspirin: Secondary | ICD-10-CM | POA: Diagnosis not present

## 2013-05-10 DIAGNOSIS — Z87448 Personal history of other diseases of urinary system: Secondary | ICD-10-CM | POA: Insufficient documentation

## 2013-05-10 DIAGNOSIS — E785 Hyperlipidemia, unspecified: Secondary | ICD-10-CM | POA: Diagnosis not present

## 2013-05-10 DIAGNOSIS — Z8709 Personal history of other diseases of the respiratory system: Secondary | ICD-10-CM | POA: Diagnosis not present

## 2013-05-10 DIAGNOSIS — Z85828 Personal history of other malignant neoplasm of skin: Secondary | ICD-10-CM | POA: Insufficient documentation

## 2013-05-10 DIAGNOSIS — E119 Type 2 diabetes mellitus without complications: Secondary | ICD-10-CM | POA: Insufficient documentation

## 2013-05-10 DIAGNOSIS — I10 Essential (primary) hypertension: Secondary | ICD-10-CM | POA: Diagnosis not present

## 2013-05-10 DIAGNOSIS — G8929 Other chronic pain: Secondary | ICD-10-CM | POA: Insufficient documentation

## 2013-05-10 DIAGNOSIS — M25519 Pain in unspecified shoulder: Secondary | ICD-10-CM | POA: Insufficient documentation

## 2013-05-10 DIAGNOSIS — R9431 Abnormal electrocardiogram [ECG] [EKG]: Secondary | ICD-10-CM | POA: Diagnosis not present

## 2013-05-10 DIAGNOSIS — Z79899 Other long term (current) drug therapy: Secondary | ICD-10-CM | POA: Insufficient documentation

## 2013-05-10 DIAGNOSIS — I2510 Atherosclerotic heart disease of native coronary artery without angina pectoris: Secondary | ICD-10-CM | POA: Diagnosis not present

## 2013-05-10 DIAGNOSIS — M1990 Unspecified osteoarthritis, unspecified site: Secondary | ICD-10-CM | POA: Insufficient documentation

## 2013-05-10 DIAGNOSIS — M25512 Pain in left shoulder: Secondary | ICD-10-CM

## 2013-05-10 IMAGING — CR DG SHOULDER 2+V*L*
4 series · 4 of 4 positions shown · non-contrast
Comparison: None.

CLINICAL DATA: Pain. No injury.

EXAM:
LEFT SHOULDER - 2+ VIEW

[w shoulder ap external left]
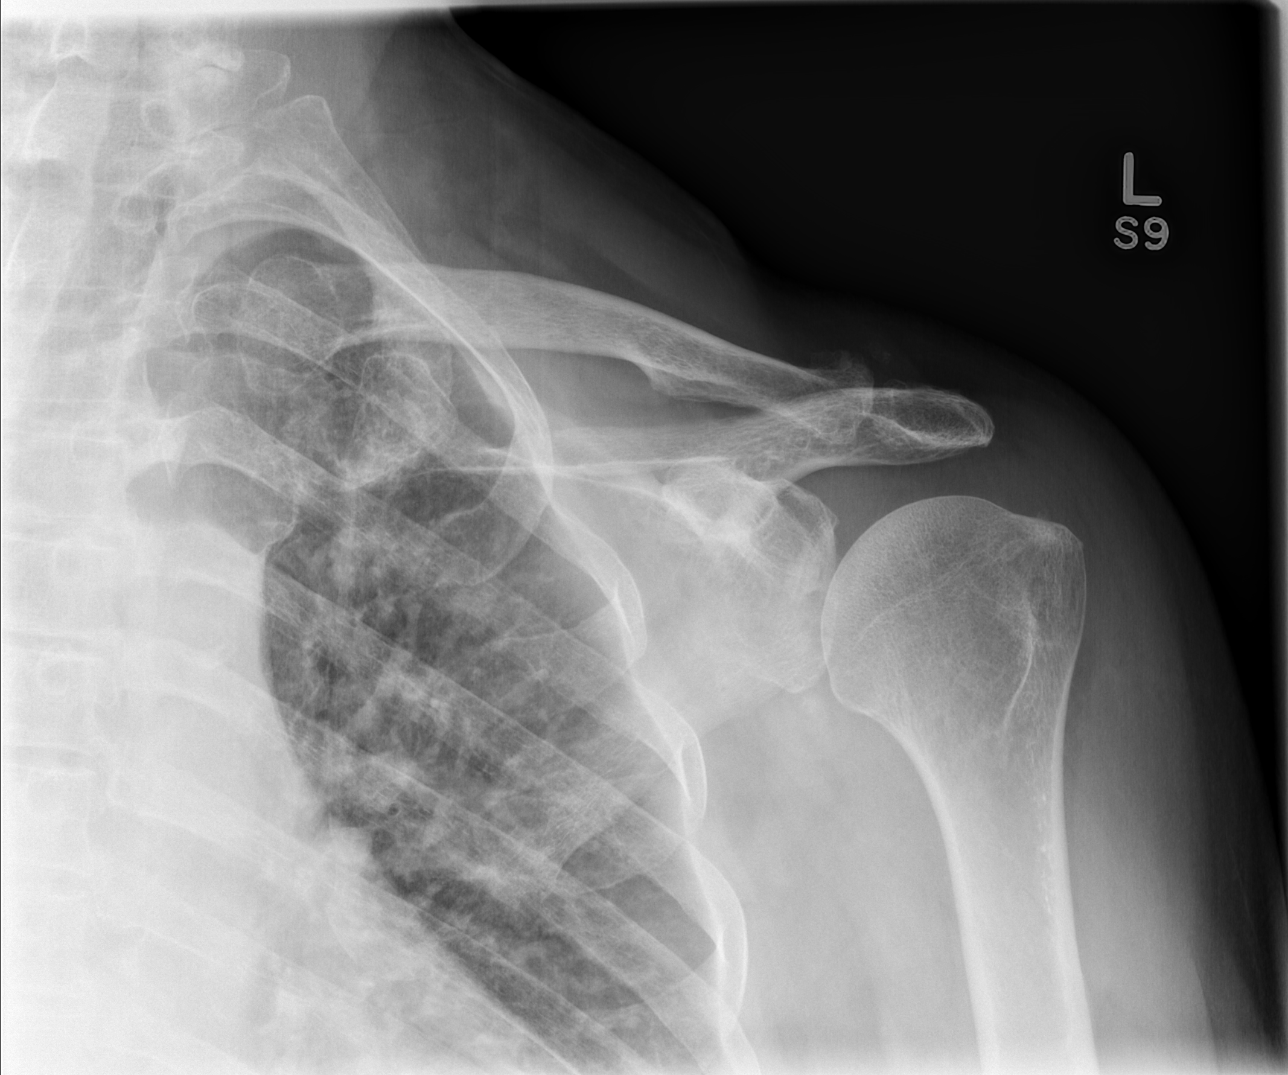

[w shoulder y view left]
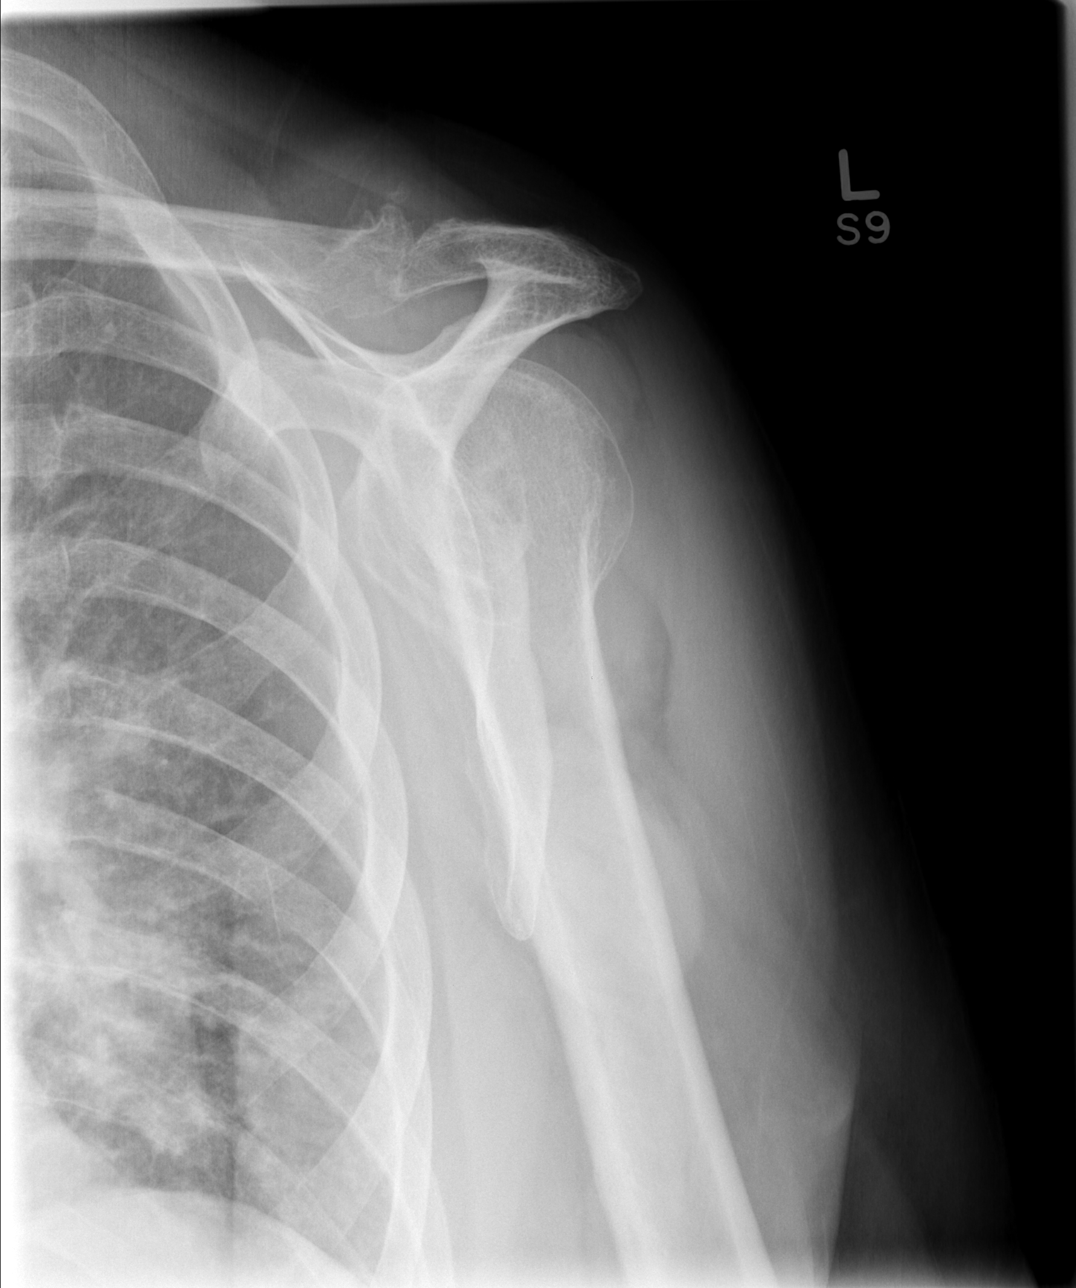

[x shoulder axillary left]
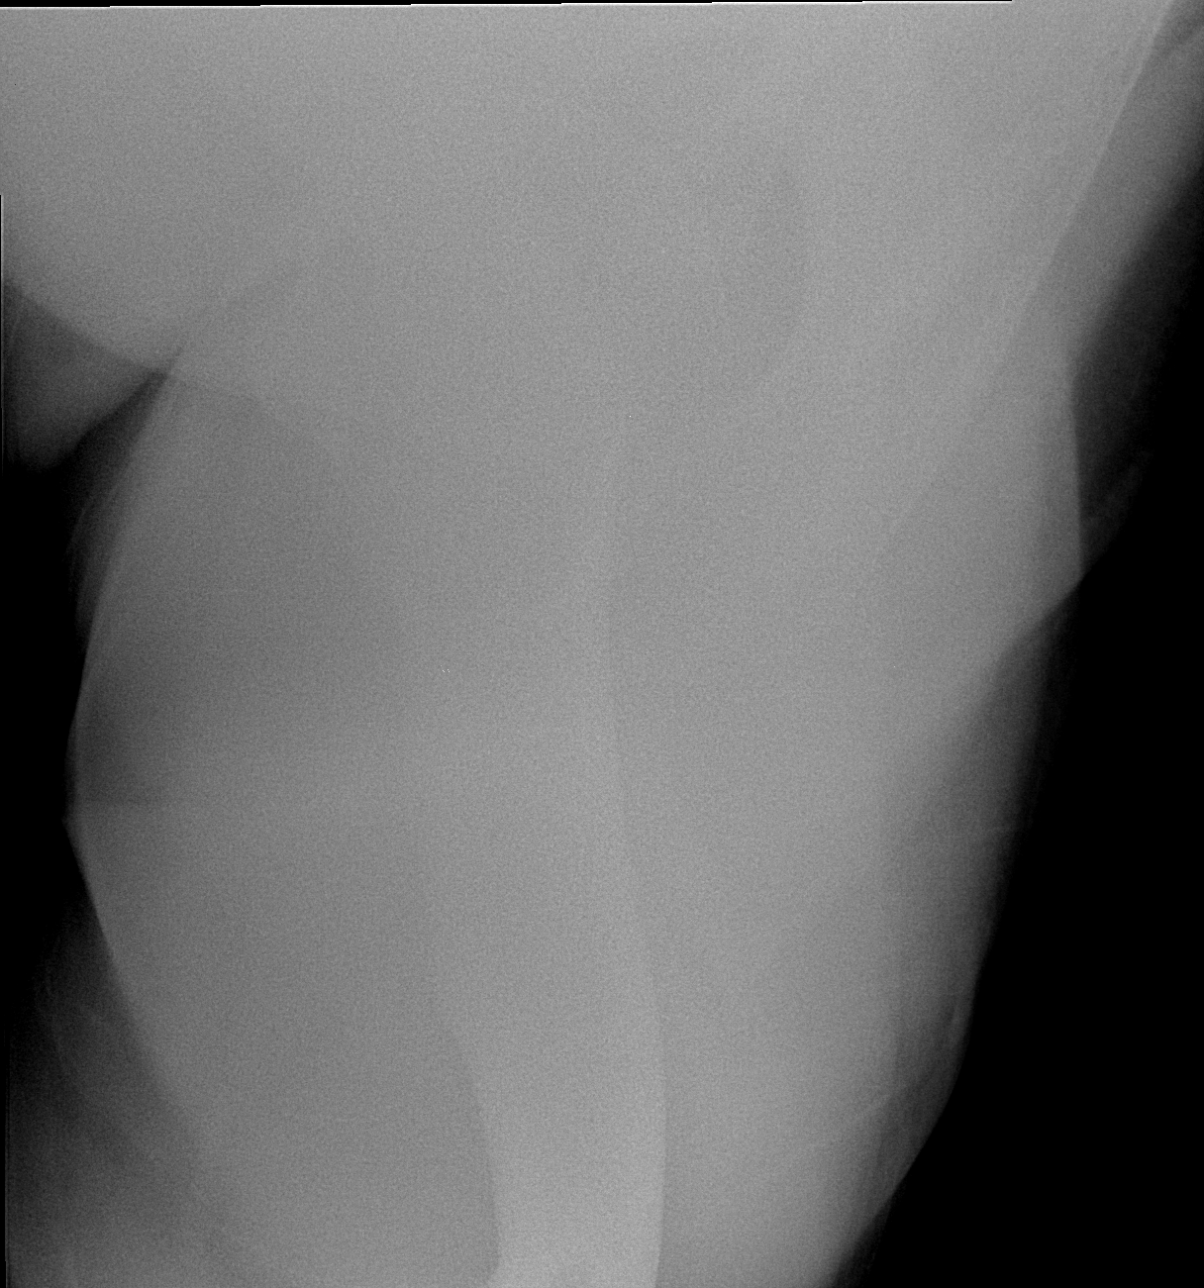

[x shoulder axillary left *]
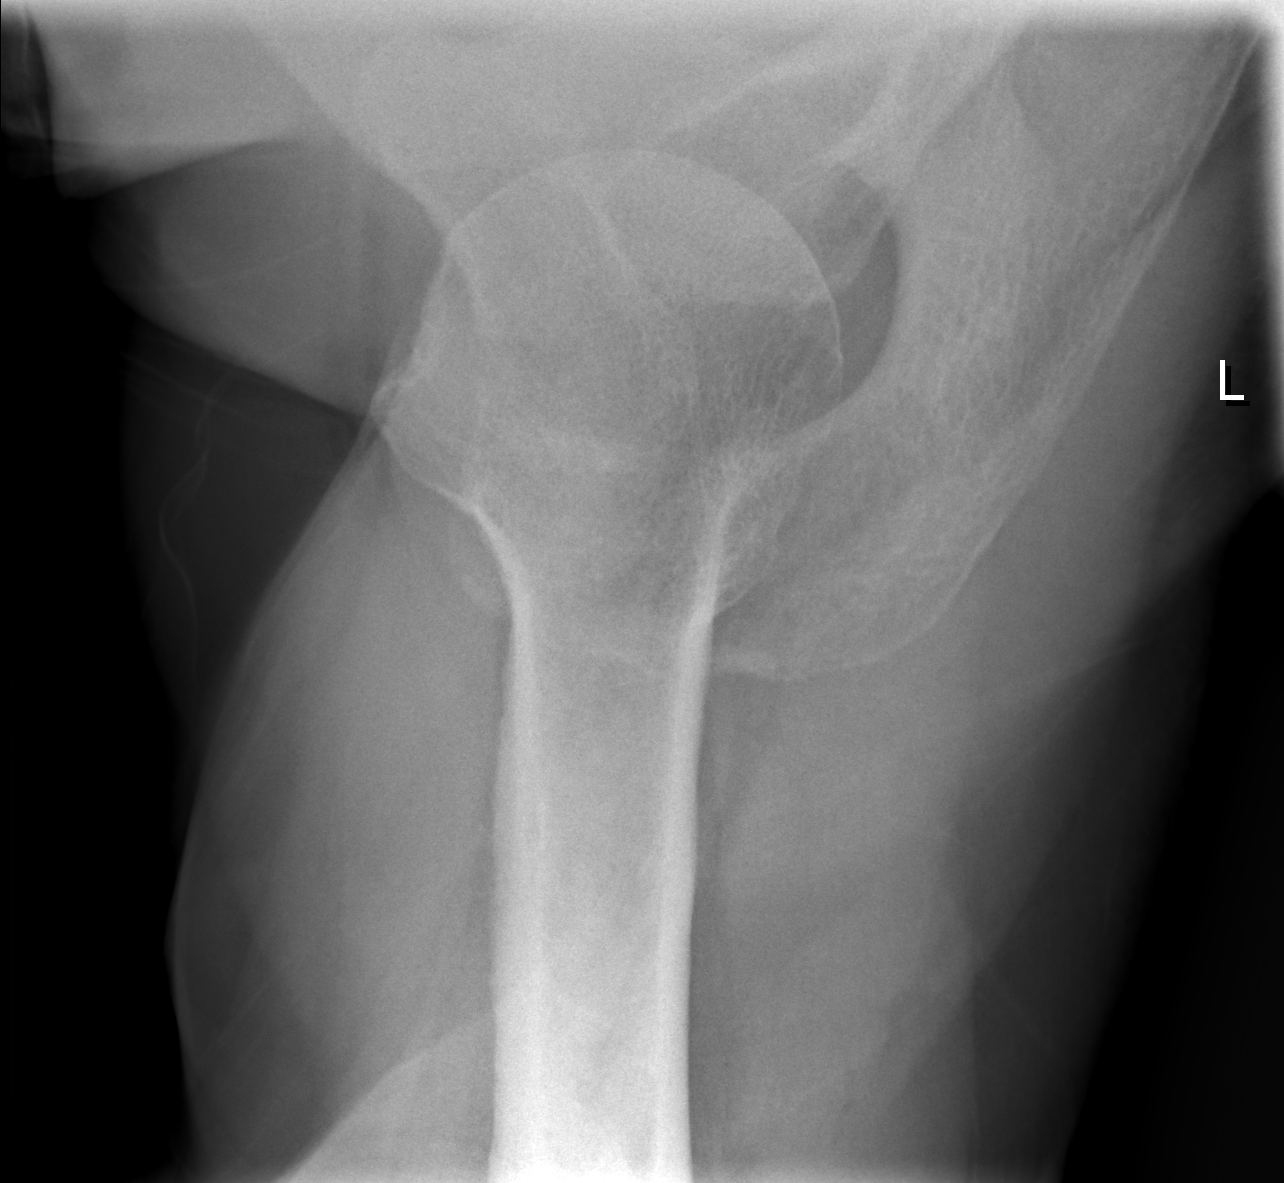

[4 of 4 positions shown; findings below may reference images not displayed]

FINDINGS: There is minimal degenerative change of the AC joint. There is no
evidence of acute fracture or dislocation.
IMPRESSION: No acute findings.

## 2013-05-10 MED ORDER — HYDROCODONE-ACETAMINOPHEN 5-325 MG PO TABS: 1.0000 | ORAL_TABLET | Freq: Four times a day (QID) | ORAL | Status: DC | PRN

## 2013-05-10 MED ORDER — HYDROCODONE-ACETAMINOPHEN 5-325 MG PO TABS: 1.0000 | ORAL_TABLET | Freq: Once | ORAL | Status: DC

## 2013-05-10 MED ORDER — IBUPROFEN 400 MG PO TABS: 400.0000 mg | ORAL_TABLET | Freq: Four times a day (QID) | ORAL | Status: AC | PRN

## 2013-05-10 NOTE — ED Notes (Signed)
Patient transported to X-ray 

## 2013-05-10 NOTE — ED Notes (Signed)
A week ago pt hurt his L shoulder while tying a rope on generator.  The shoulder pain continues to increase and now pt also feels pain to L bicep and forearm.

## 2013-05-10 NOTE — ED Provider Notes (Signed)
CSN: [id number]     Arrival date & time 05/10/13  1230 History   First MD Initiated Contact with Patient 05/10/13 1246     Chief Complaint  Patient presents with  . Shoulder Pain   (Consider location/radiation/quality/duration/timing/severity/associated sxs/prior Treatment) HPI  This is a 79 year old male with a history of coronary artery disease, hypertension, hyperlipidemia who presents with left shoulder pain. Patient reports 8-10 days of pain. He states that onset of pain was when he went to started generator with a road. He reached over and pulled forcefully with his left shoulder. He has had achy left shoulder pain. He reports decreased range of motion secondary to pain. Ibuprofen has helped but last night the pain got much worse.  He reports his pain is 7/10. He denies any chest pain or shortness of breath.  Past Medical History  Diagnosis Date  . Allergy     allergic rhinitis  . Hx of skin cancer, basal cell     many skin cancers removed/under constant treatment.  . Diabetes mellitus     type II x8 years  . GERD (gastroesophageal reflux disease)   . Hypertension   . Arthritis     OA  . BPH (benign prostatic hyperplasia)     microwave tx of prostate  . AC (acromioclavicular) joint bone spurs     lt ankle  . HLD (hyperlipidemia)     10/12: TC 208, TG 213, HDL 36, LDL 129  . CAD (coronary artery disease)     Myoview 04/16/11: mild apical ischemia, study not gated.    . Scoliosis   . Chronic upper back pain   . Obstructive sleep apnea on CPAP    Past Surgical History  Procedure Laterality Date  . Breast surgery  2009    breast lump removed benign   Family History  Problem Relation Age of Onset  . Heart attack Brother    History  Substance Use Topics  . Smoking status: Never Smoker   . Smokeless tobacco: Never Used  . Alcohol Use: No    Review of Systems  Respiratory: Negative for shortness of breath.   Cardiovascular: Negative for chest pain.   Musculoskeletal: Negative for neck pain and neck stiffness.       Left shoulder pain    Allergies  Loratadine and Simvastatin  Home Medications   Current Outpatient Rx  Name  Route  Sig  Dispense  Refill  . aspirin 81 MG tablet   Oral   Take 81 mg by mouth daily.           . Flax OIL   Oral   Take 1 capsule by mouth daily.           Marland Kitchen glipiZIDE (GLUCOTROL) 10 MG tablet   Oral   Take 10 mg by mouth 2 (two) times daily before a meal.         . glucose blood (FREESTYLE LITE) test strip      Check blood sugar twice daily and as needed. 250.0   100 each      . hydrochlorothiazide (HYDRODIURIL) 25 MG tablet   Oral   Take 25 mg by mouth daily.         . Lancets (FREESTYLE) lancets      Check glucose once daily and as needed.250.0          . lisinopril (PRINIVIL,ZESTRIL) 20 MG tablet   Oral   Take 20 mg by mouth daily.         Marland Kitchen  metFORMIN (GLUCOPHAGE) 1000 MG tablet   Oral   Take 1,000 mg by mouth 2 (two) times daily with a meal.         . nitroGLYCERIN (NITROSTAT) 0.4 MG SL tablet   Sublingual   Place 1 tablet (0.4 mg total) under the tongue every 5 (five) minutes as needed for chest pain.   25 tablet   6   . Omega-3 Fatty Acids (FISH OIL PO)   Oral   Take 2 capsules by mouth daily.           . pravastatin (PRAVACHOL) 40 MG tablet   Oral   Take 1 tablet (40 mg total) by mouth every evening.   90 tablet   3   . PRESCRIPTION MEDICATION   Oral   Take 1 tablet by mouth daily. "Accelerate study" drug from Dr.Crenshaw         . HYDROcodone-acetaminophen (NORCO/VICODIN) 5-325 MG per tablet   Oral   Take 1 tablet by mouth every 6 (six) hours as needed.   6 tablet   0   . ibuprofen (ADVIL,MOTRIN) 400 MG tablet   Oral   Take 1 tablet (400 mg total) by mouth every 6 (six) hours as needed.   12 tablet   0    BP 134/60  Pulse 54  Temp(Src) 98.2 F (36.8 C)  Resp 16  Wt 149 lb (67.586 kg)  SpO2 99% Physical Exam  Nursing note and  vitals reviewed. Constitutional: He is oriented to person, place, and time. He appears well-developed and well-nourished. No distress.  HENT:  Head: Normocephalic and atraumatic.  Eyes: Pupils are equal, round, and reactive to light.  Neck: Neck supple.  Cardiovascular: Normal rate and normal heart sounds.   No murmur heard. Pulmonary/Chest: Effort normal and breath sounds normal. No respiratory distress.  Abdominal: Soft. Bowel sounds are normal. There is no tenderness.  Musculoskeletal: He exhibits no edema.  Focused examination of the left shoulder reveals no obvious deformity of the clavicle. There is limited range of motion with abduction secondary to pain. Strength appears intact. Neurovascularly intact distally.  Lymphadenopathy:    He has no cervical adenopathy.  Neurological: He is alert and oriented to person, place, and time.  Skin: Skin is warm and dry.  Psychiatric: He has a normal mood and affect.    ED Course  Procedures (including critical care time) Labs Review Labs Reviewed - No data to display Imaging Review Dg Shoulder Left  05/10/2013   CLINICAL DATA:  Pain. No injury.  EXAM: LEFT SHOULDER - 2+ VIEW  COMPARISON:  None.  FINDINGS: There is minimal degenerative change of the [facility] joint. There is no evidence of acute fracture or dislocation.  IMPRESSION: No acute findings.   Electronically Signed   By: Elberta Fortis M.D.   On: 05/10/2013 13:54    EKG Interpretation     Ventricular Rate:  71 PR Interval:  200 QRS Duration: 96 QT Interval:  400 QTC Calculation: 434 R Axis:   75 Text Interpretation:  Sinus rhythm with marked sinus arrhythmia Nonspecific ST abnormality Abnormal ECG No significant change since last tracing            MDM   1. Shoulder pain, acute, left    Patient presents with left shoulder pain in the setting of mechanical stress. He is nontoxic-appearing on exam. Shoulder exam shows no evidence of deformity. Range of motion is limited  by pain. Patient is refusing pain medication at this time.  Plain films are negative for acute dislocation or fracture. While the patient strength is intact, he may have a small rotator cuff tear or some inflammatory process of the shoulder. Patient was advised to followup with his primary care physician for referral to sports medicine or physical therapy. Patient tolerates ibuprofen at home. I have encouraged him to use ibuprofen 400 mg every 6 hours for the next 2-3 days to see if that helps his pain. I also given him prescription for Norco. He was instructed not to take with Tylenol.  After history, exam, and medical workup I feel the patient has been appropriately medically screened and is safe for discharge home. Pertinent diagnoses were discussed with the patient. Patient was given return precautions.     Shon Baton, MD 05/10/13 [phone]

## 2013-05-10 NOTE — ED Notes (Signed)
Patient returned from X-ray 

## 2013-05-10 NOTE — ED Notes (Signed)
Denies wanting pain medication. 

## 2013-05-14 ENCOUNTER — Ambulatory Visit (INDEPENDENT_AMBULATORY_CARE_PROVIDER_SITE_OTHER): Payer: Medicare Other | Admitting: Family Medicine

## 2013-05-14 ENCOUNTER — Encounter: Payer: Self-pay | Admitting: Family Medicine

## 2013-05-14 VITALS — BP 120/66 | HR 60 | Temp 98.0°F | Ht 72.0 in | Wt 248.0 lb

## 2013-05-14 DIAGNOSIS — M25519 Pain in unspecified shoulder: Secondary | ICD-10-CM

## 2013-05-14 DIAGNOSIS — M25512 Pain in left shoulder: Secondary | ICD-10-CM

## 2013-05-14 DIAGNOSIS — S43429A Sprain of unspecified rotator cuff capsule, initial encounter: Secondary | ICD-10-CM | POA: Diagnosis not present

## 2013-05-14 DIAGNOSIS — M75112 Incomplete rotator cuff tear or rupture of left shoulder, not specified as traumatic: Secondary | ICD-10-CM

## 2013-05-14 MED ORDER — NITROGLYCERIN 0.2 MG/HR TD PT24: MEDICATED_PATCH | TRANSDERMAL | Status: DC

## 2013-05-14 NOTE — Progress Notes (Signed)
Pre-visit discussion using our clinic review tool. No additional management support is needed unless otherwise documented below in the visit note.  

## 2013-05-14 NOTE — Progress Notes (Signed)
Nature conservation officer at [facility] [address] [place] [address] Phone: [phone] Fax: [phone]  Date:  05/14/2013   Name:  Ryan Garcia   DOB:  1934/05/02   MRN:  [id number] Gender: male Age: 79 y.o.  PCP:  Roxy Manns, MD  Evaluating MD: Hannah Beat, MD   The patient noted above presents with L shoulder pain that has been ongoing for 2 weeks. He actually went to the ER several days ago. They did some plain films which are effectively unremarkable asked him to followup in office. Felt something in shoulder pull while trying to start a generator, break for a day or 2, then after a day.  When quit taking on it, now 2 weeks out.  Rotation hurts.  The patient denies neck pain or radicular symptoms. Denies dislocation, subluxation, separation of the shoulder. The patient does complain of pain in the overhead plane with significant painful arc of motion.  Medications Tried: Tylenol, NSAIDS Ice or Heat: minimally helpful Tried PT: No  Prior shoulder Injury: No Prior surgery: No Prior fracture: No  The PMH, PSH, Social History, Family History, Medications, and allergies have been reviewed in [facility], and have been updated if relevant.  REVIEW OF SYSTEMS  GEN: No fevers, chills. Nontoxic. Primarily MSK c/o today. MSK: Detailed in the HPI GI: tolerating PO intake without difficulty Neuro: No numbness, parasthesias, or tingling associated. Otherwise the pertinent positives of the ROS are noted above.   PHYSICAL EXAM  Blood pressure 120/66, pulse 60, temperature 98 F (36.7 C), temperature source Oral, height 6' (1.829 m), weight 248 lb (112.492 kg).  GEN: Well-developed,well-nourished,in no acute distress; alert,appropriate and cooperative throughout examination HEENT: Normocephalic and atraumatic without obvious abnormalities. Ears, externally no deformities PULM: Breathing comfortably in no respiratory distress EXT: No clubbing, cyanosis, or edema PSYCH:  Normally interactive. Cooperative during the interview. Pleasant. Friendly and conversant. Not anxious or depressed appearing. Normal, full affect.   Shoulder: L Inspection: No muscle wasting or winging Ecchymosis/edema: neg  AC joint, scapula, clavicle: NT Cervical spine: NT, full ROM Spurling's: neg Abduction: full, 5/5 Flexion: full, 5/5 IR, full, lift-off: 5/5 ER at neutral: full, 5/5 AC crossover: neg Neer: neg Hawkins: mild pos Drop Test: neg Empty Can: pos Supraspinatus insertion: mild-mod T Bicipital groove: NT Speed's: mild pos Yergason's: neg Sulcus sign: neg Scapular dyskinesis: none C5-T1 intact  Neuro: Sensation intact Grip 5/5   Dg Shoulder Left  05/10/2013   CLINICAL DATA:  Pain. No injury.  EXAM: LEFT SHOULDER - 2+ VIEW  COMPARISON:  None.  FINDINGS: There is minimal degenerative change of the [facility] joint. There is no evidence of acute fracture or dislocation.  IMPRESSION: No acute findings.   Electronically Signed   By: Elberta Fortis M.D.   On: 05/10/2013 13:54   Left shoulder series independently reviewed by myself. There is minimal osteoarthritis with some evident in the a.c. joint, but minimal to none in the glenohumeral joint appreciated. There is no evidence for fracture or dislocation. Hannah Beat, MD 05/14/2013 2:17 PM    Partial tear of left rotator cuff  Left shoulder pain  He is a very good mechanism for having a partial thickness cuff tear, likely in the supraspinatus. His strength is intact, so full thickness tears highly doubtful. His motion is excellent. He is an excellent candidate for conservative management. He also has multivessel coronary disease.  I gave him the [facility] shoulder rehab protocol, and he will f/u in 2 months.  Patient Instructions  Nitroglycerin Protocol   Apply 1/4 nitroglycerin patch to affected area daily.  Change position of patch within the affected area every 24 hours.  You may experience a headache during the  first 1-2 weeks of using the patch, these should subside.  If you experience headaches after beginning nitroglycerin patch treatment, you may take your preferred over the counter pain reliever.  Another side effect of the nitroglycerin patch is skin irritation or rash related to patch adhesive.  Please notify our office if you develop more severe headaches or rash, and stop the patch.   Tendon healing with nitroglycerin patch may require 12 to 24 weeks depending on the extent of injury.  Men should not use if taking Viagra, Cialis, or Levitra.   Do not use if you have migraines or rosacea.    Orders Today:  No orders of the defined types were placed in this encounter.    New medications, updates to list, dose adjustments: Meds ordered this encounter  Medications  . nitroGLYCERIN (NITRODUR - DOSED IN MG/24 HR) 0.2 mg/hr patch    Sig: Apply 1/4 of a patch to the affected area and change every 24 hours (re: tendinopathy)    Dispense:  30 patch    Refill:  4    Signed,  Nami Strawder T. Euna Armon, MD, CAQ Sports Medicine  [facility] at [facility] [address] [place] [address] Phone: [phone] Fax: [phone]  Updated Complete Medication List:   Medication List       This list is accurate as of: 05/14/13  2:17 PM.  Always use your most recent med list.               aspirin 81 MG tablet  Take 81 mg by mouth daily.     FISH OIL PO  Take 2 capsules by mouth daily.     Flax Oil  Take 1 capsule by mouth daily.     freestyle lancets  Check glucose once daily and as needed.250.0     glipiZIDE 10 MG tablet  Commonly known as:  GLUCOTROL  Take 10 mg by mouth 2 (two) times daily before a meal.     glucose blood test strip  Commonly known as:  FREESTYLE LITE  Check blood sugar twice daily and as needed. 250.0     hydrochlorothiazide 25 MG tablet  Commonly known as:  HYDRODIURIL  Take 25 mg by mouth daily.     HYDROcodone-acetaminophen 5-325 MG  per tablet  Commonly known as:  NORCO/VICODIN  Take 1 tablet by mouth every 6 (six) hours as needed.     lisinopril 20 MG tablet  Commonly known as:  PRINIVIL,ZESTRIL  Take 20 mg by mouth daily.     metFORMIN 1000 MG tablet  Commonly known as:  GLUCOPHAGE  Take 1,000 mg by mouth 2 (two) times daily with a meal.     nitroGLYCERIN 0.4 MG SL tablet  Commonly known as:  NITROSTAT  Place 1 tablet (0.4 mg total) under the tongue every 5 (five) minutes as needed for chest pain.     nitroGLYCERIN 0.2 mg/hr patch  Commonly known as:  NITRODUR - Dosed in mg/24 hr  Apply 1/4 of a patch to the affected area and change every 24 hours (re: tendinopathy)     pravastatin 40 MG tablet  Commonly known as:  PRAVACHOL  Take 1 tablet (40 mg total) by mouth every evening.     PRESCRIPTION MEDICATION  Take  1 tablet by mouth daily. "Accelerate study" drug from Dr.Crenshaw

## 2013-05-14 NOTE — Patient Instructions (Addendum)

## 2013-05-18 DIAGNOSIS — H524 Presbyopia: Secondary | ICD-10-CM | POA: Diagnosis not present

## 2013-05-18 DIAGNOSIS — E119 Type 2 diabetes mellitus without complications: Secondary | ICD-10-CM | POA: Diagnosis not present

## 2013-05-18 DIAGNOSIS — H2510 Age-related nuclear cataract, unspecified eye: Secondary | ICD-10-CM | POA: Diagnosis not present

## 2013-05-22 ENCOUNTER — Other Ambulatory Visit: Payer: Self-pay | Admitting: Family Medicine

## 2013-05-22 MED ORDER — METFORMIN HCL 1000 MG PO TABS: 1000.0000 mg | ORAL_TABLET | Freq: Two times a day (BID) | ORAL | Status: DC

## 2013-05-24 ENCOUNTER — Telehealth: Payer: Self-pay | Admitting: Family Medicine

## 2013-05-24 DIAGNOSIS — Z125 Encounter for screening for malignant neoplasm of prostate: Secondary | ICD-10-CM

## 2013-05-24 DIAGNOSIS — E119 Type 2 diabetes mellitus without complications: Secondary | ICD-10-CM

## 2013-05-24 DIAGNOSIS — I10 Essential (primary) hypertension: Secondary | ICD-10-CM

## 2013-05-24 DIAGNOSIS — E7800 Pure hypercholesterolemia, unspecified: Secondary | ICD-10-CM

## 2013-05-24 NOTE — Telephone Encounter (Signed)
Please ask if mr Putt wants a psa test or not with his labs-unsure if medicare will pay-- and d/c order if he does not want it  thanks

## 2013-05-24 NOTE — Telephone Encounter (Signed)
Message copied by Judy Pimple on Sun May 24, 2013  4:10 PM ------      Message from: Alvina Chou      Created: Tue May 19, 2013 12:13 PM      Regarding: Lab orders for Monday, 11.24.14       Patient is scheduled for CPX labs, please order future labs, Thanks , Ryan Garcia       ------

## 2013-05-25 ENCOUNTER — Other Ambulatory Visit (INDEPENDENT_AMBULATORY_CARE_PROVIDER_SITE_OTHER): Payer: Medicare Other

## 2013-05-25 DIAGNOSIS — I10 Essential (primary) hypertension: Secondary | ICD-10-CM

## 2013-05-25 DIAGNOSIS — Z125 Encounter for screening for malignant neoplasm of prostate: Secondary | ICD-10-CM | POA: Diagnosis not present

## 2013-05-25 DIAGNOSIS — E7800 Pure hypercholesterolemia, unspecified: Secondary | ICD-10-CM | POA: Diagnosis not present

## 2013-05-25 DIAGNOSIS — E119 Type 2 diabetes mellitus without complications: Secondary | ICD-10-CM

## 2013-05-25 LAB — COMPREHENSIVE METABOLIC PANEL
CO2: 24 mEq/L (ref 19–32)
Calcium: 9.1 mg/dL (ref 8.4–10.5)
Chloride: 104 mEq/L (ref 96–112)
Creatinine, Ser: 1.3 mg/dL (ref 0.4–1.5)
GFR: 59.49 mL/min — ABNORMAL LOW (ref >60.00)
Glucose, Bld: 170 mg/dL — ABNORMAL HIGH (ref 70–99)
Sodium: 138 mEq/L (ref 135–145)
Total Bilirubin: 0.7 mg/dL (ref 0.3–1.2)
Total Protein: 6.4 g/dL (ref 6.0–8.3)

## 2013-05-25 LAB — CBC WITH DIFFERENTIAL/PLATELET
Basophils Absolute: 0 10*3/uL (ref 0.0–0.1)
Eosinophils Relative: 4 % (ref 0.0–5.0)
HCT: 40.6 % (ref 39.0–52.0)
Hemoglobin: 13.6 g/dL (ref 13.0–17.0)
Lymphs Abs: 1.7 10*3/uL (ref 0.7–4.0)
MCHC: 33.6 g/dL (ref 30.0–36.0)
Monocytes Relative: 9.6 % (ref 3.0–12.0)
Neutro Abs: 4.5 10*3/uL (ref 1.4–7.7)
RDW: 14.3 % (ref 11.5–14.6)
WBC: 7.3 10*3/uL (ref 4.5–10.5)

## 2013-05-25 LAB — TSH: TSH: 3.75 u[IU]/mL (ref 0.35–5.50)

## 2013-05-25 LAB — PSA: PSA: 1.87 ng/mL (ref 0.10–4.00)

## 2013-05-25 LAB — HEMOGLOBIN A1C: Hgb A1c MFr Bld: 6.9 % — ABNORMAL HIGH (ref 4.6–6.5)

## 2013-05-25 LAB — LIPID PANEL: HDL: 34.1 mg/dL — ABNORMAL LOW (ref >39.00)

## 2013-05-25 NOTE — Telephone Encounter (Signed)
Patient said he had some sort of surgery on his prostrate, he does want PSA done if he still has a prostrate. I did tell him and his wife [employer] may not cover the cost

## 2013-05-25 NOTE — Telephone Encounter (Signed)
Knowing that,  Hopefully it  will -we need to do it , thanks

## 2013-05-29 ENCOUNTER — Encounter: Payer: Self-pay | Admitting: Family Medicine

## 2013-05-29 ENCOUNTER — Ambulatory Visit (INDEPENDENT_AMBULATORY_CARE_PROVIDER_SITE_OTHER): Payer: Medicare Other | Admitting: Family Medicine

## 2013-05-29 VITALS — BP 124/72 | HR 94 | Temp 98.0°F | Ht 71.0 in | Wt 244.5 lb

## 2013-05-29 DIAGNOSIS — E7800 Pure hypercholesterolemia, unspecified: Secondary | ICD-10-CM | POA: Diagnosis not present

## 2013-05-29 DIAGNOSIS — Z23 Encounter for immunization: Secondary | ICD-10-CM

## 2013-05-29 DIAGNOSIS — E119 Type 2 diabetes mellitus without complications: Secondary | ICD-10-CM

## 2013-05-29 DIAGNOSIS — Z0000 Encounter for general adult medical examination without abnormal findings: Secondary | ICD-10-CM

## 2013-05-29 DIAGNOSIS — E669 Obesity, unspecified: Secondary | ICD-10-CM

## 2013-05-29 DIAGNOSIS — I10 Essential (primary) hypertension: Secondary | ICD-10-CM

## 2013-05-29 DIAGNOSIS — Z006 Encounter for examination for normal comparison and control in clinical research program: Secondary | ICD-10-CM | POA: Insufficient documentation

## 2013-05-29 DIAGNOSIS — Z125 Encounter for screening for malignant neoplasm of prostate: Secondary | ICD-10-CM

## 2013-05-29 NOTE — Progress Notes (Signed)
Pre-visit discussion using our clinic review tool. No additional management support is needed unless otherwise documented below in the visit note.  

## 2013-05-29 NOTE — Progress Notes (Signed)
Subjective:    Patient ID: Ryan Garcia, male    DOB: 11-06-1933, 79 y.o.   MRN: [id number]  HPI I have personally reviewed the Medicare Annual Wellness questionnaire and have noted 1. The patient's medical and social history 2. Their use of alcohol, tobacco or illicit drugs 3. Their current medications and supplements 4. The patient's functional ability including ADL's, fall risks, home safety risks and hearing or visual             impairment. 5. Diet and physical activities 6. Evidence for depression or mood disorders  The patients weight, height, BMI have been recorded in the chart and visual acuity is per eye clinic.  I have made referrals, counseling and provided education to the patient based review of the above and I have provided the pt with a written personalized care plan for preventive services.  Has usual skin cancers  Also a partially torn rotator cuff  Otherwise doing ok   See scanned forms.  Routine anticipatory guidance given to patient.  See health maintenance. Flu- had it today  Shingles 5/14 vaccine  PNA 1//08 vaccine  Tetanus 1/13 vaccine  Colon  2/12 - ?what the recall was - not sure if he had polyps   Prostate cancer screening-- - not a lot of symptoms , he has nocturia about 1 time if any , stream is about the same  Lab Results  Component Value Date   PSA 1.87 05/25/2013   PSA 2.13 08/09/2009   PSA 1.64 11/26/2006    No family history with prostate cancer    Advance directive - does not have living will  Cognitive function addressed- see scanned forms- and if abnormal then additional documentation follows. -is about the same / occ has trouble with names   PMH and SH reviewed  Meds, vitals, and allergies reviewed.   ROS: See HPI.  Otherwise negative.    bp is stable today  No cp or palpitations or headaches or edema  No side effects to medicines  BP Readings from Last 3 Encounters:  05/29/13 124/72  05/14/13 120/66  05/10/13 145/98      Chemistry      Component Value Date/Time   NA 138 05/25/2013 1023   K 4.4 05/25/2013 1023   CL 104 05/25/2013 1023   CO2 24 05/25/2013 1023   BUN 22 05/25/2013 1023   CREATININE 1.3 05/25/2013 1023      Component Value Date/Time   CALCIUM 9.1 05/25/2013 1023   ALKPHOS 64 05/25/2013 1023   AST 16 05/25/2013 1023   ALT 19 05/25/2013 1023   BILITOT 0.7 05/25/2013 1023     sugar was 170  ams 130s-150s , and pm more labile - anywhere from 120s to 170s  Is watching what he eats and occasional binges  Working daily for exercise -- building and working with wood and tractor - too busy to walk  opthy 11/14 was ok  Lab Results  Component Value Date   HGBA1C 6.9* 05/25/2013   that is up from 6.3   Lab Results  Component Value Date   WBC 7.3 05/25/2013   HGB 13.6 05/25/2013   HCT 40.6 05/25/2013   MCV 93.6 05/25/2013   PLT 190.0 05/25/2013   Lab Results  Component Value Date   TSH 3.75 05/25/2013   Lab Results  Component Value Date   CHOL 135 05/25/2013   CHOL 130 11/17/2012   CHOL 143 07/16/2012   Lab Results  Component Value  Date   HDL 34.10* 05/25/2013   HDL 30.30* 11/17/2012   HDL 31.60* 07/16/2012   Lab Results  Component Value Date   LDLCALC 76 05/25/2013   LDLCALC 90 07/16/2012   LDLCALC 46 06/28/2011   Lab Results  Component Value Date   TRIG 124.0 05/25/2013   TRIG 306.0* 11/17/2012   TRIG 108.0 07/16/2012   Lab Results  Component Value Date   CHOLHDL 4 05/25/2013   CHOLHDL 4 11/17/2012   CHOLHDL 5 07/16/2012   Lab Results  Component Value Date   LDLDIRECT 78.5 11/17/2012   LDLDIRECT 156.5 05/09/2010   LDLDIRECT 152.9 08/09/2009   cholesterol has improved overall    Patient Active Problem List   Diagnosis Date Noted  . Encounter for Medicare annual wellness exam 05/29/2013  . Prostate cancer screening 07/15/2012  . Abnormal chest x-ray 05/24/2011  . Cough 05/23/2011  . CAD (coronary artery disease) 05/17/2011  . Chest pain 04/30/2011  . Abnormal  Stress Myoview 04/30/2011  . Shortness of breath 04/30/2011  . Shingles 04/14/2011  . Arm pain, left 04/14/2011  . Decreased hearing 04/14/2011  . Upper back pain, chronic 04/06/2011  . Abnormal EKG 04/06/2011  . Left arm pain 04/06/2011  . URINARY INCONTINENCE 05/15/2010  . BACK PAIN 01/13/2010  . DIABETES MELLITUS, TYPE II 10/04/2006  . HYPERCHOLESTEROLEMIA 10/04/2006  . OBESITY 10/04/2006  . ERECTILE DYSFUNCTION 10/04/2006  . HYPERTENSION 10/04/2006  . ALLERGIC RHINITIS 10/04/2006  . GERD 10/04/2006  . OSTEOARTHRITIS 10/04/2006  . SLEEP APNEA 10/04/2006  . EDEMA 10/04/2006  . SKIN CANCER, HX OF 10/04/2006   Past Medical History  Diagnosis Date  . Allergy     allergic rhinitis  . Hx of skin cancer, basal cell     many skin cancers removed/under constant treatment.  . Diabetes mellitus     type II x8 years  . GERD (gastroesophageal reflux disease)   . Hypertension   . Arthritis     OA  . BPH (benign prostatic hyperplasia)     microwave tx of prostate  . AC (acromioclavicular) joint bone spurs     lt ankle  . HLD (hyperlipidemia)     10/12: TC 208, TG 213, HDL 36, LDL 129  . CAD (coronary artery disease)     Myoview 04/16/11: mild apical ischemia, study not gated.    . Scoliosis   . Chronic upper back pain   . Obstructive sleep apnea on CPAP    Past Surgical History  Procedure Laterality Date  . Breast surgery  2009    breast lump removed benign   History  Substance Use Topics  . Smoking status: Never Smoker   . Smokeless tobacco: Never Used  . Alcohol Use: No   Family History  Problem Relation Age of Onset  . Heart attack Brother    Allergies  Allergen Reactions  . Loratadine     REACTION: not effective  . Simvastatin     REACTION: intolerant   Current Outpatient Prescriptions on File Prior to Visit  Medication Sig Dispense Refill  . aspirin 81 MG tablet Take 81 mg by mouth daily.        . Flax OIL Take 1 capsule by mouth daily.        Marland Kitchen  glipiZIDE (GLUCOTROL) 10 MG tablet Take 10 mg by mouth 2 (two) times daily before a meal.      . glucose blood (FREESTYLE LITE) test strip Check blood sugar twice daily and as needed. 250.0  100 each    . hydrochlorothiazide (HYDRODIURIL) 25 MG tablet Take 25 mg by mouth daily.      . Lancets (FREESTYLE) lancets Check glucose once daily and as needed.250.0       . lisinopril (PRINIVIL,ZESTRIL) 20 MG tablet Take 20 mg by mouth daily.      . metFORMIN (GLUCOPHAGE) 1000 MG tablet Take 1 tablet (1,000 mg total) by mouth 2 (two) times daily with a meal.  180 tablet  1  . nitroGLYCERIN (NITRODUR - DOSED IN MG/24 HR) 0.2 mg/hr patch Apply 1/4 of a patch to the affected area and change every 24 hours (re: tendinopathy)  30 patch  4  . nitroGLYCERIN (NITROSTAT) 0.4 MG SL tablet Place 1 tablet (0.4 mg total) under the tongue every 5 (five) minutes as needed for chest pain.  25 tablet  6  . Omega-3 Fatty Acids (FISH OIL PO) Take 2 capsules by mouth daily.        . pravastatin (PRAVACHOL) 40 MG tablet Take 1 tablet (40 mg total) by mouth every evening.  90 tablet  3  . PRESCRIPTION MEDICATION Take 1 tablet by mouth daily. "Accelerate study" drug from Dr.Crenshaw       No current facility-administered medications on file prior to visit.    Review of Systems Review of Systems  Constitutional: Negative for fever, appetite change, fatigue and unexpected weight change.  Eyes: Negative for pain and visual disturbance.  Respiratory: Negative for cough and shortness of breath.   Cardiovascular: Negative for cp or palpitations    Gastrointestinal: Negative for nausea, diarrhea and constipation.  Genitourinary: Negative for urgency and frequency.  Skin: Negative for pallor or rash   Neurological: Negative for weakness, light-headedness, numbness and headaches.  Hematological: Negative for adenopathy. Does not bruise/bleed easily.  Psychiatric/Behavioral: Negative for dysphoric mood. The patient is not  nervous/anxious.         Objective:   Physical Exam  Constitutional: He is oriented to person, place, and time. He appears well-developed and well-nourished. No distress.  obese and well appearing   HENT:  Head: Normocephalic and atraumatic.  Right Ear: External ear normal.  Left Ear: External ear normal.  Nose: Nose normal.  Mouth/Throat: Oropharynx is clear and moist.  Eyes: Conjunctivae and EOM are normal. Pupils are equal, round, and reactive to light. Right eye exhibits no discharge. Left eye exhibits no discharge. No scleral icterus.  Neck: Normal range of motion. Neck supple. No JVD present. Carotid bruit is not present. No thyromegaly present.  Cardiovascular: Normal rate, regular rhythm, normal heart sounds and intact distal pulses.  Exam reveals no gallop.   Pulmonary/Chest: Effort normal and breath sounds normal. No respiratory distress. He has no wheezes. He has no rales.  Abdominal: Soft. Bowel sounds are normal. He exhibits no distension, no abdominal bruit and no mass. There is no tenderness.  Genitourinary:  Pt def DRE today due to stable psa and lack of symptoms or family hx    Musculoskeletal: Normal range of motion. He exhibits no edema and no tenderness.  Lymphadenopathy:    He has no cervical adenopathy.  Neurological: He is alert and oriented to person, place, and time. He has normal reflexes. No cranial nerve deficit. He exhibits normal muscle tone. Coordination normal.  Skin: Skin is warm and dry. No rash noted. No erythema. No pallor.  Psychiatric: He has a normal mood and affect.          Assessment & Plan:

## 2013-05-29 NOTE — Patient Instructions (Addendum)
It is a good idea to work on a living will - here is a packet to read about it  Flu shot today  It looks like you will be due for colonoscopy in 2017

## 2013-05-31 NOTE — Assessment & Plan Note (Signed)
BP: 124/72 mmHg  bp in fair control at this time  No changes needed Disc lifstyle change with low sodium diet and exercise  Labs reviewed

## 2013-05-31 NOTE — Assessment & Plan Note (Signed)
Reviewed health habits including diet and exercise and skin cancer prevention Reviewed appropriate screening tests for age  Also reviewed health mt list, fam hx and immunization status , as well as social and family history   See HPI Labs reviewed  

## 2013-05-31 NOTE — Assessment & Plan Note (Signed)
Disc goals for lipids and reasons to control them Rev labs with pt Rev low sat fat diet in detail pravachol and diet

## 2013-05-31 NOTE — Assessment & Plan Note (Signed)
Lab Results  Component Value Date   PSA 1.87 05/25/2013   PSA 2.13 08/09/2009   PSA 1.64 11/26/2006    Stable No new symptoms and no family hx Pt def DRE today

## 2013-05-31 NOTE — Assessment & Plan Note (Signed)
Discussed how this problem influences overall health and the risks it imposes  Reviewed plan for weight loss with lower calorie diet (via better food choices and also portion control or program like weight watchers) and exercise building up to or more than 30 minutes 5 days per week including some aerobic activity    

## 2013-05-31 NOTE — Assessment & Plan Note (Signed)
Lab Results  Component Value Date   HGBA1C 6.9* 05/25/2013    That is up from 6.3 Rev diet/ exercise in great detail today He plans to do better now

## 2013-06-03 DIAGNOSIS — C44319 Basal cell carcinoma of skin of other parts of face: Secondary | ICD-10-CM | POA: Diagnosis not present

## 2013-06-04 ENCOUNTER — Other Ambulatory Visit: Payer: Self-pay | Admitting: Family Medicine

## 2013-06-21 ENCOUNTER — Other Ambulatory Visit: Payer: Self-pay | Admitting: Family Medicine

## 2013-06-29 DIAGNOSIS — D0460 Carcinoma in situ of skin of unspecified upper limb, including shoulder: Secondary | ICD-10-CM | POA: Diagnosis not present

## 2013-07-14 ENCOUNTER — Ambulatory Visit: Payer: Medicare Other | Admitting: Family Medicine

## 2013-07-20 ENCOUNTER — Ambulatory Visit: Payer: Medicare Other | Admitting: Family Medicine

## 2013-07-22 ENCOUNTER — Ambulatory Visit: Payer: Medicare Other | Admitting: Family Medicine

## 2013-07-27 ENCOUNTER — Ambulatory Visit (INDEPENDENT_AMBULATORY_CARE_PROVIDER_SITE_OTHER): Payer: Medicare Other | Admitting: Family Medicine

## 2013-07-27 ENCOUNTER — Encounter: Payer: Self-pay | Admitting: Family Medicine

## 2013-07-27 VITALS — BP 124/62 | HR 71 | Temp 98.4°F | Wt 248.2 lb

## 2013-07-27 DIAGNOSIS — S43429A Sprain of unspecified rotator cuff capsule, initial encounter: Secondary | ICD-10-CM | POA: Diagnosis not present

## 2013-07-27 DIAGNOSIS — M25512 Pain in left shoulder: Secondary | ICD-10-CM

## 2013-07-27 DIAGNOSIS — M25519 Pain in unspecified shoulder: Secondary | ICD-10-CM

## 2013-07-27 DIAGNOSIS — M75112 Incomplete rotator cuff tear or rupture of left shoulder, not specified as traumatic: Secondary | ICD-10-CM

## 2013-07-27 NOTE — Progress Notes (Signed)
Pre-visit discussion using our clinic review tool. No additional management support is needed unless otherwise documented below in the visit note.  

## 2013-07-27 NOTE — Progress Notes (Signed)
Therapist, music at [facility] [place] [address] Phone: [phone] Fax: [phone]  Date:  07/27/2013   Name:  FATEH KINDLE   DOB:  1933-08-15   MRN:  [id number] Gender: male Age: 80 y.o.  PCP:  Loura Pardon, MD  Evaluating MD: Owens Loffler, MD  F/u L shoulder.  50% better, painful to reach overhead. Most of the time aware of it.  He has been almost completely noncompliant with the [facility] shoulder program. "I am active"  Doing better, gets some pain with lifting arm and outreach  05/14/2013 OV: The patient noted above presents with L shoulder pain that has been ongoing for 2 weeks. He actually went to the ER several days ago. They did some plain films which are effectively unremarkable asked him to followup in office. Felt something in shoulder pull while trying to start a generator, break for a day or 2, then after a day.  When quit taking on it, now 2 weeks out.  Rotation hurts.  The patient denies neck pain or radicular symptoms. Denies dislocation, subluxation, separation of the shoulder. The patient does complain of pain in the overhead plane with significant painful arc of motion.  Medications Tried: Tylenol, NSAIDS Ice or Heat: minimally helpful Tried PT: No  Prior shoulder Injury: No Prior surgery: No Prior fracture: No  The PMH, PSH, Social History, Family History, Medications, and allergies have been reviewed in [facility], and have been updated if relevant.  REVIEW OF SYSTEMS  GEN: No fevers, chills. Nontoxic. Primarily MSK c/o today. MSK: Detailed in the HPI GI: tolerating PO intake without difficulty Neuro: No numbness, parasthesias, or tingling associated. Otherwise the pertinent positives of the ROS are noted above.   PHYSICAL EXAM  Blood pressure 124/62, pulse 71, temperature 98.4 F (36.9 C), temperature source Oral, weight 248 lb 4 oz (112.605 kg), SpO2 95.00%.  GEN: Well-developed,well-nourished,in no acute distress;  alert,appropriate and cooperative throughout examination HEENT: Normocephalic and atraumatic without obvious abnormalities. Ears, externally no deformities PULM: Breathing comfortably in no respiratory distress EXT: No clubbing, cyanosis, or edema PSYCH: Normally interactive. Cooperative during the interview. Pleasant. Friendly and conversant. Not anxious or depressed appearing. Normal, full affect.   Shoulder: L Inspection: No muscle wasting or winging Ecchymosis/edema: neg  AC joint, scapula, clavicle: NT Cervical spine: NT, full ROM Spurling's: neg Abduction: full, 4++/5 Flexion: full, 5/5 IR, full, lift-off: 5/5 ER at neutral: full, 5/5 AC crossover: neg Neer: neg Hawkins: mild pos Drop Test: neg Empty Can: pos Supraspinatus insertion: mild-mod T Bicipital groove: NT Speed's: mild pos Yergason's: neg Sulcus sign: neg Scapular dyskinesis: none C5-T1 intact  Neuro: Sensation intact Grip 5/5   Dg Shoulder Left  05/10/2013   CLINICAL DATA:  Pain. No injury.  EXAM: LEFT SHOULDER - 2+ VIEW  COMPARISON:  None.  FINDINGS: There is minimal degenerative change of the [facility] joint. There is no evidence of acute fracture or dislocation.  IMPRESSION: No acute findings.   Electronically Signed   By: Marin Olp M.D.   On: 05/10/2013 13:54   Left shoulder series independently reviewed by myself. There is minimal osteoarthritis with some evident in the a.c. joint, but minimal to none in the glenohumeral joint appreciated. There is no evidence for fracture or dislocation. Owens Loffler, MD 07/27/2013 2:27 PM    Partial tear of left rotator cuff - Plan: Ambulatory referral to Physical Therapy  Left shoulder pain - Plan: Ambulatory referral to Physical Therapy  Tolerating NTG Doing  fairly well with 50% improvement. I expect would do better if compliant with HEP /  Rehab Formal pt  Recheck 6-8 weeks  Patient Instructions  REFERRALS TO SPECIALISTS, SPECIAL TESTS (MRI, CT,  ULTRASOUNDS)  GO THE WAITING ROOM AND TELL CHECK IN YOU NEED HELP WITH A REFERRAL. Either [facility]ION or LINDA will help you set it up.  If it is between 1-2 PM they may be at lunch.  After 5 PM, they will likely be at home.  They will call you, so please make sure the office has your correct phone number.  Referrals sometimes can be done same day if urgent, but others can take 2 or 3 days to get an appointment. Starting in 2015, some of the new Medicare insurance plans and [place] offered on the Exchange take longer for referrals. They have added additional paperwork and steps.  MRI's and CT's can take up to a week for the test. (Emergencies like strokes take precedence. I will tell you if you have an emergency.)   Specialist appointment times vary a great deal, mostly on the specialist's schedule and if they have openings. -- Our office tries to get you in as fast as possible. -- Some specialists have very long wait times. (Example. Dermatology. Usually months) -- If you have a true emergency like new cancer, we work to get you in ASAP.      Orders Today:  Orders Placed This Encounter  Procedures  . Ambulatory referral to Physical Therapy    Referral Priority:  Routine    Referral Type:  Physical Medicine    Referral Reason:  Specialty Services Required    Requested Specialty:  Physical Therapy    Number of Visits Requested:  1    New medications, updates to list, dose adjustments: No orders of the defined types were placed in this encounter.    Signed,  Maud Deed. Deon Duer, MD, [place] at [facility] [place] [address] Phone: [phone] Fax: [phone]  Updated Complete Medication List:   Medication List       This list is accurate as of: 07/27/13 11:59 PM.  Always use your most recent med list.               aspirin 81 MG tablet  Take 81 mg by mouth daily.     FISH OIL PO  Take 2  capsules by mouth daily.     Flax Oil  Take 1 capsule by mouth daily.     freestyle lancets  Check glucose once daily and as needed.250.0     glipiZIDE 10 MG tablet  Commonly known as:  GLUCOTROL  TAKE ONE TABLET BY MOUTH TWICE A DAY WITH MEALS     glucose blood test strip  Commonly known as:  FREESTYLE LITE  Check blood sugar twice daily and as needed. 250.0     hydrochlorothiazide 25 MG tablet  Commonly known as:  HYDRODIURIL  TAKE ONE TABLET BY MOUTH EVERY DAY     lisinopril 20 MG tablet  Commonly known as:  PRINIVIL,ZESTRIL  Take 20 mg by mouth daily.     metFORMIN 1000 MG tablet  Commonly known as:  GLUCOPHAGE  Take 1 tablet (1,000 mg total) by mouth 2 (two) times daily with a meal.     nitroGLYCERIN 0.4 MG SL tablet  Commonly known as:  NITROSTAT  Place 1 tablet (0.4 mg total) under the tongue every 5 (  five) minutes as needed for chest pain.     nitroGLYCERIN 0.2 mg/hr patch  Commonly known as:  NITRODUR - Dosed in mg/24 hr  Apply 1/4 of a patch to the affected area and change every 24 hours (re: tendinopathy)     pravastatin 40 MG tablet  Commonly known as:  PRAVACHOL  Take 1 tablet (40 mg total) by mouth every evening.     PRESCRIPTION MEDICATION  Take 1 tablet by mouth daily. "Accelerate study" drug from [place]

## 2013-07-27 NOTE — Patient Instructions (Signed)
REFERRALS TO SPECIALISTS, SPECIAL TESTS (MRI, CT, ULTRASOUNDS)  GO THE WAITING ROOM AND TELL CHECK IN YOU NEED HELP WITH A REFERRAL. Either MARION or LINDA will help you set it up.  If it is between 1-2 PM they may be at lunch.  After 5 PM, they will likely be at home.  They will call you, so please make sure the office has your correct phone number.  Referrals sometimes can be done same day if urgent, but others can take 2 or 3 days to get an appointment. Starting in 2015, some of the new Medicare insurance plans and Affordable Care Health plans offered on the Exchange take longer for referrals. They have added additional paperwork and steps.  MRI's and CT's can take up to a week for the test. (Emergencies like strokes take precedence. I will tell you if you have an emergency.)   Specialist appointment times vary a great deal, mostly on the specialist's schedule and if they have openings. -- Our office tries to get you in as fast as possible. -- Some specialists have very long wait times. (Example. Dermatology. Usually months) -- If you have a true emergency like new cancer, we work to get you in ASAP.   

## 2013-07-28 DIAGNOSIS — M6281 Muscle weakness (generalized): Secondary | ICD-10-CM | POA: Diagnosis not present

## 2013-07-28 DIAGNOSIS — M67919 Unspecified disorder of synovium and tendon, unspecified shoulder: Secondary | ICD-10-CM | POA: Diagnosis not present

## 2013-07-28 DIAGNOSIS — M25519 Pain in unspecified shoulder: Secondary | ICD-10-CM | POA: Diagnosis not present

## 2013-07-28 DIAGNOSIS — M25619 Stiffness of unspecified shoulder, not elsewhere classified: Secondary | ICD-10-CM | POA: Diagnosis not present

## 2013-07-30 DIAGNOSIS — M67919 Unspecified disorder of synovium and tendon, unspecified shoulder: Secondary | ICD-10-CM | POA: Diagnosis not present

## 2013-07-30 DIAGNOSIS — M6281 Muscle weakness (generalized): Secondary | ICD-10-CM | POA: Diagnosis not present

## 2013-07-30 DIAGNOSIS — M719 Bursopathy, unspecified: Secondary | ICD-10-CM | POA: Diagnosis not present

## 2013-07-30 DIAGNOSIS — M25619 Stiffness of unspecified shoulder, not elsewhere classified: Secondary | ICD-10-CM | POA: Diagnosis not present

## 2013-07-30 DIAGNOSIS — M25519 Pain in unspecified shoulder: Secondary | ICD-10-CM | POA: Diagnosis not present

## 2013-08-04 DIAGNOSIS — M6281 Muscle weakness (generalized): Secondary | ICD-10-CM | POA: Diagnosis not present

## 2013-08-04 DIAGNOSIS — M25619 Stiffness of unspecified shoulder, not elsewhere classified: Secondary | ICD-10-CM | POA: Diagnosis not present

## 2013-08-04 DIAGNOSIS — M67919 Unspecified disorder of synovium and tendon, unspecified shoulder: Secondary | ICD-10-CM | POA: Diagnosis not present

## 2013-08-04 DIAGNOSIS — M25519 Pain in unspecified shoulder: Secondary | ICD-10-CM | POA: Diagnosis not present

## 2013-08-07 DIAGNOSIS — M25519 Pain in unspecified shoulder: Secondary | ICD-10-CM | POA: Diagnosis not present

## 2013-08-07 DIAGNOSIS — M6281 Muscle weakness (generalized): Secondary | ICD-10-CM | POA: Diagnosis not present

## 2013-08-07 DIAGNOSIS — M25619 Stiffness of unspecified shoulder, not elsewhere classified: Secondary | ICD-10-CM | POA: Diagnosis not present

## 2013-08-07 DIAGNOSIS — M67919 Unspecified disorder of synovium and tendon, unspecified shoulder: Secondary | ICD-10-CM | POA: Diagnosis not present

## 2013-08-10 DIAGNOSIS — M67919 Unspecified disorder of synovium and tendon, unspecified shoulder: Secondary | ICD-10-CM | POA: Diagnosis not present

## 2013-08-10 DIAGNOSIS — M25519 Pain in unspecified shoulder: Secondary | ICD-10-CM | POA: Diagnosis not present

## 2013-08-10 DIAGNOSIS — M719 Bursopathy, unspecified: Secondary | ICD-10-CM | POA: Diagnosis not present

## 2013-08-10 DIAGNOSIS — M25619 Stiffness of unspecified shoulder, not elsewhere classified: Secondary | ICD-10-CM | POA: Diagnosis not present

## 2013-08-10 DIAGNOSIS — M6281 Muscle weakness (generalized): Secondary | ICD-10-CM | POA: Diagnosis not present

## 2013-08-13 DIAGNOSIS — H2510 Age-related nuclear cataract, unspecified eye: Secondary | ICD-10-CM | POA: Diagnosis not present

## 2013-08-13 DIAGNOSIS — H35379 Puckering of macula, unspecified eye: Secondary | ICD-10-CM | POA: Diagnosis not present

## 2013-08-14 DIAGNOSIS — M25519 Pain in unspecified shoulder: Secondary | ICD-10-CM | POA: Diagnosis not present

## 2013-08-14 DIAGNOSIS — M6281 Muscle weakness (generalized): Secondary | ICD-10-CM | POA: Diagnosis not present

## 2013-08-14 DIAGNOSIS — M719 Bursopathy, unspecified: Secondary | ICD-10-CM | POA: Diagnosis not present

## 2013-08-14 DIAGNOSIS — M25619 Stiffness of unspecified shoulder, not elsewhere classified: Secondary | ICD-10-CM | POA: Diagnosis not present

## 2013-08-14 DIAGNOSIS — M67919 Unspecified disorder of synovium and tendon, unspecified shoulder: Secondary | ICD-10-CM | POA: Diagnosis not present

## 2013-08-17 DIAGNOSIS — M67919 Unspecified disorder of synovium and tendon, unspecified shoulder: Secondary | ICD-10-CM | POA: Diagnosis not present

## 2013-08-17 DIAGNOSIS — M25519 Pain in unspecified shoulder: Secondary | ICD-10-CM | POA: Diagnosis not present

## 2013-08-17 DIAGNOSIS — M25619 Stiffness of unspecified shoulder, not elsewhere classified: Secondary | ICD-10-CM | POA: Diagnosis not present

## 2013-08-17 DIAGNOSIS — M6281 Muscle weakness (generalized): Secondary | ICD-10-CM | POA: Diagnosis not present

## 2013-08-20 DIAGNOSIS — M25519 Pain in unspecified shoulder: Secondary | ICD-10-CM | POA: Diagnosis not present

## 2013-08-20 DIAGNOSIS — M25619 Stiffness of unspecified shoulder, not elsewhere classified: Secondary | ICD-10-CM | POA: Diagnosis not present

## 2013-08-20 DIAGNOSIS — M6281 Muscle weakness (generalized): Secondary | ICD-10-CM | POA: Diagnosis not present

## 2013-08-20 DIAGNOSIS — M67919 Unspecified disorder of synovium and tendon, unspecified shoulder: Secondary | ICD-10-CM | POA: Diagnosis not present

## 2013-08-20 DIAGNOSIS — M719 Bursopathy, unspecified: Secondary | ICD-10-CM | POA: Diagnosis not present

## 2013-08-26 DIAGNOSIS — M25619 Stiffness of unspecified shoulder, not elsewhere classified: Secondary | ICD-10-CM | POA: Diagnosis not present

## 2013-08-26 DIAGNOSIS — M6281 Muscle weakness (generalized): Secondary | ICD-10-CM | POA: Diagnosis not present

## 2013-08-26 DIAGNOSIS — M25519 Pain in unspecified shoulder: Secondary | ICD-10-CM | POA: Diagnosis not present

## 2013-08-26 DIAGNOSIS — M719 Bursopathy, unspecified: Secondary | ICD-10-CM | POA: Diagnosis not present

## 2013-08-26 DIAGNOSIS — M67919 Unspecified disorder of synovium and tendon, unspecified shoulder: Secondary | ICD-10-CM | POA: Diagnosis not present

## 2013-08-28 DIAGNOSIS — M719 Bursopathy, unspecified: Secondary | ICD-10-CM | POA: Diagnosis not present

## 2013-08-28 DIAGNOSIS — M25619 Stiffness of unspecified shoulder, not elsewhere classified: Secondary | ICD-10-CM | POA: Diagnosis not present

## 2013-08-28 DIAGNOSIS — M6281 Muscle weakness (generalized): Secondary | ICD-10-CM | POA: Diagnosis not present

## 2013-08-28 DIAGNOSIS — M67919 Unspecified disorder of synovium and tendon, unspecified shoulder: Secondary | ICD-10-CM | POA: Diagnosis not present

## 2013-08-28 DIAGNOSIS — M25519 Pain in unspecified shoulder: Secondary | ICD-10-CM | POA: Diagnosis not present

## 2013-09-01 ENCOUNTER — Telehealth: Payer: Self-pay | Admitting: *Deleted

## 2013-09-01 DIAGNOSIS — M67919 Unspecified disorder of synovium and tendon, unspecified shoulder: Secondary | ICD-10-CM | POA: Diagnosis not present

## 2013-09-01 DIAGNOSIS — M6281 Muscle weakness (generalized): Secondary | ICD-10-CM | POA: Diagnosis not present

## 2013-09-01 DIAGNOSIS — H2510 Age-related nuclear cataract, unspecified eye: Secondary | ICD-10-CM | POA: Diagnosis not present

## 2013-09-01 DIAGNOSIS — M25619 Stiffness of unspecified shoulder, not elsewhere classified: Secondary | ICD-10-CM | POA: Diagnosis not present

## 2013-09-01 DIAGNOSIS — M25519 Pain in unspecified shoulder: Secondary | ICD-10-CM | POA: Diagnosis not present

## 2013-09-01 NOTE — Telephone Encounter (Signed)
I spoke to patient's wife and asked her to have her husband increase Pravachol 80 mg everyday per Dr.Crenshaw.I explained to her that LDL was 94 mg/dl and our goal was 70 mg/dl. Patient had enough medicine for 22 days from old prescription. I told her we would call new prescription to [facility] in [place].Patient will follow -up on April 9 for lipid and liver labs at [facility] office.

## 2013-09-02 ENCOUNTER — Other Ambulatory Visit: Payer: Self-pay | Admitting: *Deleted

## 2013-09-02 DIAGNOSIS — I2510 Atherosclerotic heart disease of native coronary artery without angina pectoris: Secondary | ICD-10-CM

## 2013-09-02 MED ORDER — PRAVASTATIN SODIUM 80 MG PO TABS: 80.0000 mg | ORAL_TABLET | Freq: Every evening | ORAL | Status: DC

## 2013-09-03 DIAGNOSIS — M67919 Unspecified disorder of synovium and tendon, unspecified shoulder: Secondary | ICD-10-CM | POA: Diagnosis not present

## 2013-09-03 DIAGNOSIS — M25619 Stiffness of unspecified shoulder, not elsewhere classified: Secondary | ICD-10-CM | POA: Diagnosis not present

## 2013-09-03 DIAGNOSIS — M719 Bursopathy, unspecified: Secondary | ICD-10-CM | POA: Diagnosis not present

## 2013-09-03 DIAGNOSIS — M25519 Pain in unspecified shoulder: Secondary | ICD-10-CM | POA: Diagnosis not present

## 2013-09-03 DIAGNOSIS — M6281 Muscle weakness (generalized): Secondary | ICD-10-CM | POA: Diagnosis not present

## 2013-09-07 ENCOUNTER — Ambulatory Visit: Payer: Medicare Other | Admitting: Family Medicine

## 2013-09-07 DIAGNOSIS — M719 Bursopathy, unspecified: Secondary | ICD-10-CM | POA: Diagnosis not present

## 2013-09-07 DIAGNOSIS — M25519 Pain in unspecified shoulder: Secondary | ICD-10-CM | POA: Diagnosis not present

## 2013-09-07 DIAGNOSIS — M67919 Unspecified disorder of synovium and tendon, unspecified shoulder: Secondary | ICD-10-CM | POA: Diagnosis not present

## 2013-09-07 DIAGNOSIS — M6281 Muscle weakness (generalized): Secondary | ICD-10-CM | POA: Diagnosis not present

## 2013-09-07 DIAGNOSIS — M25619 Stiffness of unspecified shoulder, not elsewhere classified: Secondary | ICD-10-CM | POA: Diagnosis not present

## 2013-09-09 ENCOUNTER — Ambulatory Visit: Payer: Self-pay | Admitting: Ophthalmology

## 2013-09-09 DIAGNOSIS — Z79899 Other long term (current) drug therapy: Secondary | ICD-10-CM | POA: Diagnosis not present

## 2013-09-09 DIAGNOSIS — E7800 Pure hypercholesterolemia, unspecified: Secondary | ICD-10-CM | POA: Diagnosis not present

## 2013-09-09 DIAGNOSIS — I10 Essential (primary) hypertension: Secondary | ICD-10-CM | POA: Diagnosis not present

## 2013-09-09 DIAGNOSIS — M19049 Primary osteoarthritis, unspecified hand: Secondary | ICD-10-CM | POA: Diagnosis not present

## 2013-09-09 DIAGNOSIS — R0602 Shortness of breath: Secondary | ICD-10-CM | POA: Diagnosis not present

## 2013-09-09 DIAGNOSIS — H2510 Age-related nuclear cataract, unspecified eye: Secondary | ICD-10-CM | POA: Diagnosis not present

## 2013-09-09 DIAGNOSIS — H259 Unspecified age-related cataract: Secondary | ICD-10-CM | POA: Diagnosis not present

## 2013-09-09 DIAGNOSIS — Z7982 Long term (current) use of aspirin: Secondary | ICD-10-CM | POA: Diagnosis not present

## 2013-09-09 DIAGNOSIS — Z85828 Personal history of other malignant neoplasm of skin: Secondary | ICD-10-CM | POA: Diagnosis not present

## 2013-09-09 DIAGNOSIS — E119 Type 2 diabetes mellitus without complications: Secondary | ICD-10-CM | POA: Diagnosis not present

## 2013-09-09 DIAGNOSIS — G4730 Sleep apnea, unspecified: Secondary | ICD-10-CM | POA: Diagnosis not present

## 2013-09-14 ENCOUNTER — Encounter: Payer: Self-pay | Admitting: Cardiology

## 2013-09-17 ENCOUNTER — Other Ambulatory Visit: Payer: Self-pay | Admitting: Family Medicine

## 2013-09-21 ENCOUNTER — Ambulatory Visit (INDEPENDENT_AMBULATORY_CARE_PROVIDER_SITE_OTHER): Payer: Medicare Other | Admitting: Family Medicine

## 2013-09-21 ENCOUNTER — Encounter: Payer: Self-pay | Admitting: Family Medicine

## 2013-09-21 VITALS — BP 120/68 | HR 66 | Temp 98.5°F | Ht 71.0 in | Wt 247.8 lb

## 2013-09-21 DIAGNOSIS — M25519 Pain in unspecified shoulder: Secondary | ICD-10-CM

## 2013-09-21 DIAGNOSIS — M7502 Adhesive capsulitis of left shoulder: Secondary | ICD-10-CM

## 2013-09-21 DIAGNOSIS — M75112 Incomplete rotator cuff tear or rupture of left shoulder, not specified as traumatic: Secondary | ICD-10-CM

## 2013-09-21 DIAGNOSIS — S43429A Sprain of unspecified rotator cuff capsule, initial encounter: Secondary | ICD-10-CM | POA: Diagnosis not present

## 2013-09-21 DIAGNOSIS — M25512 Pain in left shoulder: Secondary | ICD-10-CM

## 2013-09-21 DIAGNOSIS — I2510 Atherosclerotic heart disease of native coronary artery without angina pectoris: Secondary | ICD-10-CM | POA: Diagnosis not present

## 2013-09-21 DIAGNOSIS — M7500 Adhesive capsulitis of unspecified shoulder: Secondary | ICD-10-CM | POA: Diagnosis not present

## 2013-09-21 NOTE — Progress Notes (Signed)
Therapist, music at [facility] [place] [address] Phone: [phone] Fax: [phone]  Date:  09/21/2013   Name:  Ryan Garcia   DOB:  1934-02-15   MRN:  [id number] Gender: male Age: 80 y.o.  PCP:  Loura Pardon, MD  Evaluating MD: Owens Loffler, MD  The patient is here in followup after an initial LEFT shoulder injury 5 or 6 months ago. He does have a partial rotator cuff tear with ongoing pain, he has been to physical therapy for 10 sessions, but he has not been particularly compliant with doing his home exercise program. Also had him initiate the nitroglycerin protocol. He is continued to not do particularly well, and he has developed some loss of motion in his LEFT shoulder as well in the terminal degrees.  07/27/2013 Last OV with Owens Loffler, MD  F/u L shoulder.  50% better, painful to reach overhead. Most of the time aware of it.  He has been almost completely noncompliant with the MOON shoulder program. "I am active"  Doing better, gets some pain with lifting arm and outreach  05/14/2013 OV: The patient noted above presents with L shoulder pain that has been ongoing for 2 weeks. He actually went to the ER several days ago. They did some plain films which are effectively unremarkable asked him to followup in office. Felt something in shoulder pull while trying to start a generator, break for a day or 2, then after a day.  When quit taking on it, now 2 weeks out.  Rotation hurts.  The patient denies neck pain or radicular symptoms. Denies dislocation, subluxation, separation of the shoulder. The patient does complain of pain in the overhead plane with significant painful arc of motion.  Medications Tried: Tylenol, NSAIDS Ice or Heat: minimally helpful Tried PT: No  Prior shoulder Injury: No Prior surgery: No Prior fracture: No  The PMH, PSH, Social History, Family History, Medications, and allergies have been reviewed in [facility], and have been  updated if relevant.  REVIEW OF SYSTEMS  GEN: No fevers, chills. Nontoxic. Primarily MSK c/o today. MSK: Detailed in the HPI GI: tolerating PO intake without difficulty Neuro: No numbness, parasthesias, or tingling associated. Otherwise the pertinent positives of the ROS are noted above.   PHYSICAL EXAM  Blood pressure 120/68, pulse 66, temperature 98.5 F (36.9 C), temperature source Oral, height 5\' 11"  (1.803 m), weight 247 lb 12 oz (112.379 kg).  GEN: Well-developed,well-nourished,in no acute distress; alert,appropriate and cooperative throughout examination HEENT: Normocephalic and atraumatic without obvious abnormalities. Ears, externally no deformities PULM: Breathing comfortably in no respiratory distress EXT: No clubbing, cyanosis, or edema PSYCH: Normally interactive. Cooperative during the interview. Pleasant. Friendly and conversant. Not anxious or depressed appearing. Normal, full affect.   Shoulder: L Inspection: No muscle wasting or winging Ecchymosis/edema: neg  AC joint, scapula, clavicle: NT Cervical spine: NT, full ROM Spurling's: neg Abduction: lacks 20 deg, 4/5 Flexion: lacks 20, 5/5 IR, full, lift-off: minimal IR at 90 abd, 5/5 ER at neutral: in 90 deg abd lacks 15 deg compared to r, 5/5 AC crossover: neg Neer: neg Hawkins: mild pos Drop Test: neg Empty Can: pos Supraspinatus insertion: mild-mod T Bicipital groove: NT Speed's: mild pos Yergason's: neg Sulcus sign: neg Scapular dyskinesis: none C5-T1 intact  Neuro: Sensation intact Grip 5/5   Partial tear of left rotator cuff  Left shoulder pain  Adhesive capsulitis of left shoulder  He has not done particularly well from a conservative management  standpoint, failing multiple conservative therapies. I talked with him about potential options, and at this point I think he would certainly be reasonable to consider arthroscopy of the shoulder and surgical consultation. At this point he has  absolutely no interest in operative intervention, and he thinks that he can live with his shoulder the way it is now on a permanent basis.  I do think he likely will have some improvement, but I strongly encouraged him to continue and try doing some home rehabilitation, and I gave him a frozen shoulder program, because I think that he is developing an early frozen shoulder, likely secondary to pain and to his initial injury.   He just had eye surgery 10 days ago.  Return in about 3 months (around 12/22/2013). No orders of the defined types were placed in this encounter.   Patient's Medications  New Prescriptions   No medications on file  Previous Medications   ASPIRIN 81 MG TABLET    Take 81 mg by mouth daily.     FLAX OIL    Take 1 capsule by mouth daily.     GLIPIZIDE (GLUCOTROL) 10 MG TABLET    TAKE ONE TABLET BY MOUTH TWICE A DAY WITH MEALS   GLUCOSE BLOOD (FREESTYLE LITE) TEST STRIP    Check blood sugar twice daily and as needed. 250.0   HYDROCHLOROTHIAZIDE (HYDRODIURIL) 25 MG TABLET    TAKE ONE TABLET BY MOUTH EVERY DAY   LANCETS (FREESTYLE) LANCETS    Check glucose once daily and as needed.250.0    LISINOPRIL (PRINIVIL,ZESTRIL) 20 MG TABLET    Take 20 mg by mouth daily.   METFORMIN (GLUCOPHAGE) 1000 MG TABLET    Take 1 tablet (1,000 mg total) by mouth 2 (two) times daily with a meal.   NITROGLYCERIN (NITRODUR - DOSED IN MG/24 HR) 0.2 MG/HR PATCH    Apply 1/4 of a patch to the affected area and change every 24 hours (re: tendinopathy)   NITROGLYCERIN (NITROSTAT) 0.4 MG SL TABLET    Place 1 tablet (0.4 mg total) under the tongue every 5 (five) minutes as needed for chest pain.   OMEGA-3 FATTY ACIDS (FISH OIL PO)    Take 2 capsules by mouth daily.     PRAVASTATIN (PRAVACHOL) 80 MG TABLET    Take 1 tablet (80 mg total) by mouth every evening.   PRESCRIPTION MEDICATION    Take 1 tablet by mouth daily. "Accelerate study" drug from Dr.Crenshaw  Modified Medications   No medications on file   Discontinued Medications   No medications on file   There are no Patient Instructions on file for this visit.  Signed,  Maud Deed. Zebulen Simonis, MD, [place] at [facility] [place] [address] Phone: [phone] Fax: [phone]

## 2013-09-21 NOTE — Progress Notes (Signed)
Pre visit review using our clinic review tool, if applicable. No additional management support is needed unless otherwise documented below in the visit note. 

## 2013-10-06 DIAGNOSIS — C44320 Squamous cell carcinoma of skin of unspecified parts of face: Secondary | ICD-10-CM | POA: Diagnosis not present

## 2013-10-06 DIAGNOSIS — C4442 Squamous cell carcinoma of skin of scalp and neck: Secondary | ICD-10-CM | POA: Diagnosis not present

## 2013-10-06 DIAGNOSIS — Z85828 Personal history of other malignant neoplasm of skin: Secondary | ICD-10-CM | POA: Diagnosis not present

## 2013-10-06 DIAGNOSIS — L570 Actinic keratosis: Secondary | ICD-10-CM | POA: Diagnosis not present

## 2013-10-06 DIAGNOSIS — D485 Neoplasm of uncertain behavior of skin: Secondary | ICD-10-CM | POA: Diagnosis not present

## 2013-10-08 ENCOUNTER — Encounter: Payer: Self-pay | Admitting: Cardiology

## 2013-10-08 ENCOUNTER — Other Ambulatory Visit (INDEPENDENT_AMBULATORY_CARE_PROVIDER_SITE_OTHER): Payer: Medicare Other

## 2013-10-08 DIAGNOSIS — I2510 Atherosclerotic heart disease of native coronary artery without angina pectoris: Secondary | ICD-10-CM

## 2013-10-08 LAB — HEPATIC FUNCTION PANEL
ALT: 29 U/L (ref 0–53)
AST: 18 U/L (ref 0–37)
Albumin: 3.8 g/dL (ref 3.5–5.2)
Alkaline Phosphatase: 60 U/L (ref 39–117)
BILIRUBIN DIRECT: 0.1 mg/dL (ref 0.0–0.3)
BILIRUBIN TOTAL: 0.9 mg/dL (ref 0.3–1.2)
Total Protein: 6.7 g/dL (ref 6.0–8.3)

## 2013-10-08 LAB — LIPID PANEL
Cholesterol: 134 mg/dL (ref 0–200)
HDL: 32.8 mg/dL — ABNORMAL LOW (ref >39.00)
LDL CALC: 76 mg/dL (ref 0–99)
Total CHOL/HDL Ratio: 4
Triglycerides: 128 mg/dL (ref 0.0–149.0)
VLDL: 25.6 mg/dL (ref 0.0–40.0)

## 2013-10-26 ENCOUNTER — Telehealth: Payer: Self-pay

## 2013-10-26 NOTE — Telephone Encounter (Signed)
Mrs Navarez left v/m; pt does not think he missed an appt to see Dr Lorelei Pont; pt received a bill for no show charge on 07/22/13. Mrs Salsgiver request cb because she does not believe this is owed.

## 2013-10-26 NOTE — Telephone Encounter (Signed)
Called pt and spoke to wife.  Explained that we will have our charge correction team to write off the $50 no show charge.

## 2013-11-02 ENCOUNTER — Other Ambulatory Visit: Payer: Self-pay | Admitting: Cardiology

## 2013-11-06 ENCOUNTER — Other Ambulatory Visit: Payer: Self-pay | Admitting: Family Medicine

## 2013-11-12 DIAGNOSIS — D044 Carcinoma in situ of skin of scalp and neck: Secondary | ICD-10-CM | POA: Diagnosis not present

## 2013-11-13 DIAGNOSIS — H35379 Puckering of macula, unspecified eye: Secondary | ICD-10-CM | POA: Diagnosis not present

## 2013-11-16 DIAGNOSIS — C44320 Squamous cell carcinoma of skin of unspecified parts of face: Secondary | ICD-10-CM | POA: Diagnosis not present

## 2013-11-16 DIAGNOSIS — L908 Other atrophic disorders of skin: Secondary | ICD-10-CM | POA: Diagnosis not present

## 2013-11-16 DIAGNOSIS — L570 Actinic keratosis: Secondary | ICD-10-CM | POA: Diagnosis not present

## 2013-11-16 DIAGNOSIS — L918 Other hypertrophic disorders of the skin: Secondary | ICD-10-CM | POA: Diagnosis not present

## 2013-11-16 DIAGNOSIS — L819 Disorder of pigmentation, unspecified: Secondary | ICD-10-CM | POA: Diagnosis not present

## 2013-11-19 ENCOUNTER — Telehealth: Payer: Self-pay | Admitting: Family Medicine

## 2013-11-19 DIAGNOSIS — E119 Type 2 diabetes mellitus without complications: Secondary | ICD-10-CM

## 2013-11-19 NOTE — Telephone Encounter (Signed)
Message copied by Abner Greenspan on Thu Nov 19, 2013  3:48 PM ------      Message from: Ellamae Sia      Created: Wed Nov 11, 2013  5:05 PM      Regarding: Lab orders for Friday, 5.22.15       Lab orders for a 6 month f/u ------

## 2013-11-20 ENCOUNTER — Other Ambulatory Visit (INDEPENDENT_AMBULATORY_CARE_PROVIDER_SITE_OTHER): Payer: Medicare Other

## 2013-11-20 ENCOUNTER — Ambulatory Visit: Payer: Medicare Other

## 2013-11-20 DIAGNOSIS — E119 Type 2 diabetes mellitus without complications: Secondary | ICD-10-CM

## 2013-11-20 LAB — BASIC METABOLIC PANEL
BUN: 23 mg/dL (ref 6–23)
CHLORIDE: 105 meq/L (ref 96–112)
CO2: 27 meq/L (ref 19–32)
Calcium: 8.8 mg/dL (ref 8.4–10.5)
Creatinine, Ser: 1.2 mg/dL (ref 0.4–1.5)
GFR: 61.68 mL/min (ref >60.00)
GLUCOSE: 155 mg/dL — AB (ref 70–99)
POTASSIUM: 4.2 meq/L (ref 3.5–5.1)
SODIUM: 139 meq/L (ref 135–145)

## 2013-11-20 LAB — HEMOGLOBIN A1C: Hgb A1c MFr Bld: 7.3 % — ABNORMAL HIGH (ref 4.6–6.5)

## 2013-11-26 ENCOUNTER — Other Ambulatory Visit: Payer: Self-pay | Admitting: Family Medicine

## 2013-11-27 ENCOUNTER — Ambulatory Visit (INDEPENDENT_AMBULATORY_CARE_PROVIDER_SITE_OTHER): Payer: Medicare Other | Admitting: Family Medicine

## 2013-11-27 ENCOUNTER — Encounter: Payer: Self-pay | Admitting: Family Medicine

## 2013-11-27 VITALS — BP 126/60 | HR 74 | Temp 98.2°F | Ht 71.0 in | Wt 249.5 lb

## 2013-11-27 DIAGNOSIS — I2510 Atherosclerotic heart disease of native coronary artery without angina pectoris: Secondary | ICD-10-CM | POA: Diagnosis not present

## 2013-11-27 DIAGNOSIS — R0609 Other forms of dyspnea: Secondary | ICD-10-CM

## 2013-11-27 DIAGNOSIS — R0602 Shortness of breath: Secondary | ICD-10-CM

## 2013-11-27 DIAGNOSIS — E119 Type 2 diabetes mellitus without complications: Secondary | ICD-10-CM

## 2013-11-27 DIAGNOSIS — I10 Essential (primary) hypertension: Secondary | ICD-10-CM

## 2013-11-27 DIAGNOSIS — E669 Obesity, unspecified: Secondary | ICD-10-CM | POA: Diagnosis not present

## 2013-11-27 NOTE — Patient Instructions (Signed)
Work on diabetic diet to get A1C down  Stay as active as you can by  Stop at check out out for cardiology referral  Follow up in 3 months with labs prior

## 2013-11-27 NOTE — Progress Notes (Signed)
Pre visit review using our clinic review tool, if applicable. No additional management support is needed unless otherwise documented below in the visit note. 

## 2013-11-27 NOTE — Progress Notes (Signed)
Subjective:    Patient ID: Ryan Garcia, male    DOB: May 09, 1934, 80 y.o.   MRN: [id number]  HPI Here for f/u of chronic medical problems   bp is stable today  No cp or palpitations or headaches or edema  No side effects to medicines  BP Readings from Last 3 Encounters:  11/27/13 126/60  09/21/13 120/68  07/27/13 124/62     Wt is up 2 lbs   bmi of 34 (he thinks it is stable)   He has not been as active with shoulder problems  Has trouble fully abducing his L arm  90% better than it was   Diabetes Home sugar results  DM diet - tries to watch what he eats somewhat , some sweets/ portion size is a little large  Exercise - stays busy  Symptoms- none  A1C last  Lab Results  Component Value Date   HGBA1C 7.3* 11/20/2013  this is up from 6.9   No problems with medications  Renal protection ace  Last eye exam 11/14   He is sob more than usual -exertional  (thinks it is from the heat) Is supposed to see Dr Caryl Comes in Aug No cp   Patient Active Problem List   Diagnosis Date Noted  . Encounter for Medicare annual wellness exam 05/29/2013  . Prostate cancer screening 07/15/2012  . Abnormal chest x-ray 05/24/2011  . CAD (coronary artery disease) 05/17/2011  . Chest pain 04/30/2011  . Abnormal Stress Myoview 04/30/2011  . Arm pain, left 04/14/2011  . Upper back pain, chronic 04/06/2011  . Abnormal EKG 04/06/2011  . Left arm pain 04/06/2011  . BACK PAIN 01/13/2010  . DIABETES MELLITUS, TYPE II 10/04/2006  . HYPERCHOLESTEROLEMIA 10/04/2006  . OBESITY 10/04/2006  . ERECTILE DYSFUNCTION 10/04/2006  . HYPERTENSION 10/04/2006  . ALLERGIC RHINITIS 10/04/2006  . GERD 10/04/2006  . OSTEOARTHRITIS 10/04/2006  . SLEEP APNEA 10/04/2006  . EDEMA 10/04/2006  . SKIN CANCER, HX OF 10/04/2006   Past Medical History  Diagnosis Date  . Allergy     allergic rhinitis  . Hx of skin cancer, basal cell     many skin cancers removed/under constant treatment.  . Diabetes mellitus    type II x8 years  . GERD (gastroesophageal reflux disease)   . Hypertension   . Arthritis     OA  . BPH (benign prostatic hyperplasia)     microwave tx of prostate  . AC (acromioclavicular) joint bone spurs     lt ankle  . HLD (hyperlipidemia)     10/12: TC 208, TG 213, HDL 36, LDL 129  . CAD (coronary artery disease)     Myoview 04/16/11: mild apical ischemia, study not gated.    . Scoliosis   . Chronic upper back pain   . Obstructive sleep apnea on CPAP    Past Surgical History  Procedure Laterality Date  . Breast surgery  2009    breast lump removed benign   History  Substance Use Topics  . Smoking status: Never Smoker   . Smokeless tobacco: Never Used  . Alcohol Use: No   Family History  Problem Relation Age of Onset  . Heart attack Brother    Allergies  Allergen Reactions  . Loratadine     REACTION: not effective  . Simvastatin     REACTION: intolerant   Current Outpatient Prescriptions on File Prior to Visit  Medication Sig Dispense Refill  . aspirin 81 MG tablet Take 81 mg  by mouth daily.        . Flax OIL Take 1 capsule by mouth daily.        Marland Kitchen glipiZIDE (GLUCOTROL) 10 MG tablet TAKE ONE TABLET BY MOUTH TWICE A DAY WITH MEALS.  60 tablet  5  . glucose blood (FREESTYLE LITE) test strip Check blood sugar twice daily and as needed. 250.0  100 each    . hydrochlorothiazide (HYDRODIURIL) 25 MG tablet TAKE ONE TABLET BY MOUTH EVERY DAY  90 tablet  1  . Lancets (FREESTYLE) lancets Check glucose once daily and as needed.250.0       . lisinopril (PRINIVIL,ZESTRIL) 20 MG tablet TAKE ONE TABLET BY MOUTH EVERY DAY  90 tablet  1  . metFORMIN (GLUCOPHAGE) 1000 MG tablet TAKE 1 TABLET (1,000 MG TOTAL) BY MOUTH 2 (TWO) TIMES DAILY WITH A MEA L.  180 tablet  0  . nitroGLYCERIN (NITROSTAT) 0.4 MG SL tablet Place 1 tablet (0.4 mg total) under the tongue every 5 (five) minutes as needed for chest pain.  25 tablet  6  . Omega-3 Fatty Acids (FISH OIL PO) Take 2 capsules by mouth  daily.        . pravastatin (PRAVACHOL) 80 MG tablet Take 1 tablet (80 mg total) by mouth every evening.  90 tablet  3  . PRESCRIPTION MEDICATION Take 1 tablet by mouth daily. "Accelerate study" drug from [place]      . triamcinolone (NASACORT) 55 MCG/ACT AERO nasal inhaler USE TWO SPRAYS EACH NOSTRIL EVERY DAY  1 Inhaler  5   No current facility-administered medications on file prior to visit.     Review of Systems Review of Systems  Constitutional: Negative for fever, appetite change, fatigue and unexpected weight change.  Eyes: Negative for pain and visual disturbance.  Respiratory: Negative for cough and pos for more shortness of breath on exertion than usual Cardiovascular: Negative for cp or palpitations    Gastrointestinal: Negative for nausea, diarrhea and constipation.  Genitourinary: Negative for urgency and frequency.  Skin: Negative for pallor or rash   Neurological: Negative for weakness, light-headedness, numbness and headaches.  Hematological: Negative for adenopathy. Does not bruise/bleed easily.  Psychiatric/Behavioral: Negative for dysphoric mood. The patient is not nervous/anxious.         Objective:   Physical Exam  Constitutional: He appears well-developed and well-nourished. No distress.  obese and well appearing   HENT:  Head: Normocephalic and atraumatic.  Mouth/Throat: Oropharynx is clear and moist.  Eyes: Conjunctivae and EOM are normal. Pupils are equal, round, and reactive to light. Right eye exhibits no discharge. Left eye exhibits no discharge. No scleral icterus.  Neck: Normal range of motion. Neck supple. No JVD present. Carotid bruit is not present. No thyromegaly present.  Cardiovascular: Normal rate and regular rhythm.   Pulmonary/Chest: Effort normal and breath sounds normal. No respiratory distress. He has no wheezes. He has no rales.  Abdominal: Soft. Bowel sounds are normal. He exhibits no distension. There is no tenderness.    Musculoskeletal: He exhibits no edema.  Lymphadenopathy:    He has no cervical adenopathy.  Neurological: He is alert. He has normal reflexes. No cranial nerve deficit. He exhibits normal muscle tone. Coordination normal.  Skin: Skin is warm and dry. No rash noted.  Psychiatric: He has a normal mood and affect.          Assessment & Plan:

## 2013-11-29 NOTE — Assessment & Plan Note (Signed)
Will alert his cardiologist-may need earlier f/u  Also thinks he may be deconditioned  Reassuring exam today

## 2013-11-29 NOTE — Assessment & Plan Note (Signed)
Lab Results  Component Value Date   HGBA1C 7.3* 11/20/2013   Pt declines more medication to control glucose  Plans to work harder on diet and exercise and wt loss  F/u 3 mo with a1c prior

## 2013-11-29 NOTE — Assessment & Plan Note (Signed)
bp in fair control at this time  BP Readings from Last 1 Encounters:  11/27/13 126/60   No changes needed Disc lifstyle change with low sodium diet and exercise

## 2013-11-29 NOTE — Assessment & Plan Note (Signed)
Discussed how this problem influences overall health and the risks it imposes  Reviewed plan for weight loss with lower calorie diet (via better food choices and also portion control or program like weight watchers) and exercise building up to or more than 30 minutes 5 days per week including some aerobic activity    

## 2013-12-14 ENCOUNTER — Telehealth: Payer: Self-pay | Admitting: Family Medicine

## 2013-12-14 NOTE — Telephone Encounter (Signed)
Error

## 2014-01-14 ENCOUNTER — Encounter: Payer: Self-pay | Admitting: Cardiology

## 2014-01-14 ENCOUNTER — Ambulatory Visit (INDEPENDENT_AMBULATORY_CARE_PROVIDER_SITE_OTHER): Payer: Medicare Other | Admitting: Cardiology

## 2014-01-14 VITALS — BP 140/60 | HR 61 | Ht 72.0 in | Wt 249.9 lb

## 2014-01-14 DIAGNOSIS — I209 Angina pectoris, unspecified: Secondary | ICD-10-CM | POA: Diagnosis not present

## 2014-01-14 DIAGNOSIS — I2510 Atherosclerotic heart disease of native coronary artery without angina pectoris: Secondary | ICD-10-CM

## 2014-01-14 DIAGNOSIS — E7800 Pure hypercholesterolemia, unspecified: Secondary | ICD-10-CM | POA: Diagnosis not present

## 2014-01-14 DIAGNOSIS — I25119 Atherosclerotic heart disease of native coronary artery with unspecified angina pectoris: Secondary | ICD-10-CM

## 2014-01-14 DIAGNOSIS — I10 Essential (primary) hypertension: Secondary | ICD-10-CM

## 2014-01-14 NOTE — Patient Instructions (Signed)
Your physician wants you to follow-up in: ONE YEAR WITH DR CRENSHAW You will receive a reminder letter in the mail two months in advance. If you don't receive a letter, please call our office to schedule the follow-up appointment.  

## 2014-01-14 NOTE — Progress Notes (Signed)
HPI: FU CAD. He was admitted 10/12 with chest pain . Myoview 04/16/11: mild apical ischemia, study not gated. Cath in Nov 2012 revealed normal LM. The LAD demonstrated a long 70-75% diffuse stenosis prior to the bifurcation. It was calcified. The diagonal had a 75% stenosis and there were tandem distal LAD lesions of 70-75%, with the vessel appearing typical for diabetes. The circumflex had a tiny ramus without disease, a moderately large OM1 with a 75% area of proximal disease. The AV vessel, leading to the OM2 had tandem 50% lesions. Both vessels appeared diabetic, but graftable. The RCA has a prox mid lesion of 60-70% . The PDA divides into two branches, and the larger branch has an 80% hypodense area of disease. This vessel is also graftable. EF 60. Dr Lia Foyer felt patient should have either medical therapy or CABG and not PCI. I reviewed films with Dr Percival Spanish and we elected medical therapy. Since he was last seen in Oct 2014, the patient has dyspnea with more extreme activities but not with routine activities. It is relieved with rest. It is not associated with chest pain. There is no orthopnea, PND or pedal edema. There is no syncope or palpitations. There is no exertional chest pain.   Current Outpatient Prescriptions  Medication Sig Dispense Refill  . aspirin 81 MG tablet Take 81 mg by mouth daily.        . Flax OIL Take 1 capsule by mouth daily.        Marland Kitchen glipiZIDE (GLUCOTROL) 10 MG tablet TAKE ONE TABLET BY MOUTH TWICE A DAY WITH MEALS.  60 tablet  5  . glucose blood (FREESTYLE LITE) test strip Check blood sugar twice daily and as needed. 250.0  100 each    . hydrochlorothiazide (HYDRODIURIL) 25 MG tablet TAKE ONE TABLET BY MOUTH EVERY DAY  90 tablet  1  . Lancets (FREESTYLE) lancets Check glucose once daily and as needed.250.0       . lisinopril (PRINIVIL,ZESTRIL) 20 MG tablet TAKE ONE TABLET BY MOUTH EVERY DAY  90 tablet  1  . metFORMIN (GLUCOPHAGE) 1000 MG tablet TAKE 1 TABLET  (1,000 MG TOTAL) BY MOUTH 2 (TWO) TIMES DAILY WITH A MEA L.  180 tablet  0  . nitroGLYCERIN (NITROSTAT) 0.4 MG SL tablet Place 1 tablet (0.4 mg total) under the tongue every 5 (five) minutes as needed for chest pain.  25 tablet  6  . Omega-3 Fatty Acids (FISH OIL PO) Take 2 capsules by mouth daily.        . pravastatin (PRAVACHOL) 80 MG tablet Take 1 tablet (80 mg total) by mouth every evening.  90 tablet  3  . PRESCRIPTION MEDICATION Take 1 tablet by mouth daily. "Accelerate study" drug from [place]      . triamcinolone (NASACORT) 55 MCG/ACT AERO nasal inhaler USE ONE SPRAYS EACH NOSTRIL EVERY DAY       No current facility-administered medications for this visit.     Past Medical History  Diagnosis Date  . Allergy     allergic rhinitis  . Hx of skin cancer, basal cell     many skin cancers removed/under constant treatment.  . Diabetes mellitus     type II x8 years  . GERD (gastroesophageal reflux disease)   . Hypertension   . Arthritis     OA  . BPH (benign prostatic hyperplasia)     microwave tx of prostate  . AC (acromioclavicular) joint bone spurs  lt ankle  . HLD (hyperlipidemia)     10/12: TC 208, TG 213, HDL 36, LDL 129  . CAD (coronary artery disease)     Myoview 04/16/11: mild apical ischemia, study not gated.    . Scoliosis   . Chronic upper back pain   . Obstructive sleep apnea on CPAP     Past Surgical History  Procedure Laterality Date  . Breast surgery  2009    breast lump removed benign    History   Social History  . Marital Status: Married    Spouse Name: N/A    Number of Children: N/A  . Years of Education: N/A   Occupational History  . Not on file.   Social History Main Topics  . Smoking status: Never Smoker   . Smokeless tobacco: Never Used  . Alcohol Use: No  . Drug Use: No  . Sexual Activity: Not on file   Other Topics Concern  . Not on file   Social History Narrative   Married    Physically active hard of hearing    ROS:  no fevers or chills, productive cough, hemoptysis, dysphasia, odynophagia, melena, hematochezia, dysuria, hematuria, rash, seizure activity, orthopnea, PND, pedal edema, claudication. Remaining systems are negative.  Physical Exam: Well-developed well-nourished in no acute distress.  Skin is warm and dry.  HEENT is normal.  Neck is supple.  Chest is clear to auscultation with normal expansion.  Cardiovascular exam is regular rate and rhythm.  Abdominal exam nontender or distended. No masses palpated. Extremities show no edema. neuro grossly intact  ECG Sinus rhythm at a rate of 61. Occasional PACs. No significant ST changes.

## 2014-01-14 NOTE — Assessment & Plan Note (Signed)
Patient without chest pain. I have previously reviewed the patient's cath films with Dr Lia Foyer and Dr Percival Spanish. He has significant diffuse coronary disease. Some of his distal vessels, particularly the LAD, are small and may be difficult to graft. We therefore plan medical therapy for now. We can consider coronary artery bypass and graft in the future if he has refractory symptoms. Continue aspirin and statin.

## 2014-01-14 NOTE — Assessment & Plan Note (Signed)
Blood pressure controlled. Continue present medications. 

## 2014-01-14 NOTE — Assessment & Plan Note (Signed)
Continue statin. 

## 2014-01-22 DIAGNOSIS — C44621 Squamous cell carcinoma of skin of unspecified upper limb, including shoulder: Secondary | ICD-10-CM | POA: Diagnosis not present

## 2014-01-22 DIAGNOSIS — L570 Actinic keratosis: Secondary | ICD-10-CM | POA: Diagnosis not present

## 2014-01-22 DIAGNOSIS — D485 Neoplasm of uncertain behavior of skin: Secondary | ICD-10-CM | POA: Diagnosis not present

## 2014-01-22 DIAGNOSIS — D0460 Carcinoma in situ of skin of unspecified upper limb, including shoulder: Secondary | ICD-10-CM | POA: Diagnosis not present

## 2014-01-22 DIAGNOSIS — Z85828 Personal history of other malignant neoplasm of skin: Secondary | ICD-10-CM | POA: Diagnosis not present

## 2014-01-23 ENCOUNTER — Other Ambulatory Visit: Payer: Self-pay | Admitting: Family Medicine

## 2014-01-25 NOTE — Telephone Encounter (Signed)
Please refill for 6 mo 

## 2014-01-25 NOTE — Telephone Encounter (Signed)
Electronic refill request, Rx not on med list, please advise 

## 2014-01-25 NOTE — Telephone Encounter (Signed)
done

## 2014-02-04 ENCOUNTER — Other Ambulatory Visit: Payer: Self-pay | Admitting: Family Medicine

## 2014-02-26 ENCOUNTER — Encounter: Payer: Self-pay | Admitting: Internal Medicine

## 2014-02-26 ENCOUNTER — Other Ambulatory Visit (INDEPENDENT_AMBULATORY_CARE_PROVIDER_SITE_OTHER): Payer: Medicare Other

## 2014-02-26 DIAGNOSIS — E119 Type 2 diabetes mellitus without complications: Secondary | ICD-10-CM | POA: Diagnosis not present

## 2014-02-26 LAB — HEMOGLOBIN A1C: HEMOGLOBIN A1C: 7.6 % — AB (ref 4.6–6.5)

## 2014-03-01 ENCOUNTER — Ambulatory Visit (INDEPENDENT_AMBULATORY_CARE_PROVIDER_SITE_OTHER): Payer: Medicare Other | Admitting: Family Medicine

## 2014-03-01 ENCOUNTER — Encounter: Payer: Self-pay | Admitting: Family Medicine

## 2014-03-01 VITALS — BP 108/64 | HR 56 | Temp 98.3°F | Ht 71.0 in | Wt 248.5 lb

## 2014-03-01 DIAGNOSIS — Z23 Encounter for immunization: Secondary | ICD-10-CM

## 2014-03-01 DIAGNOSIS — I10 Essential (primary) hypertension: Secondary | ICD-10-CM

## 2014-03-01 DIAGNOSIS — E119 Type 2 diabetes mellitus without complications: Secondary | ICD-10-CM | POA: Diagnosis not present

## 2014-03-01 DIAGNOSIS — I2510 Atherosclerotic heart disease of native coronary artery without angina pectoris: Secondary | ICD-10-CM

## 2014-03-01 NOTE — Patient Instructions (Signed)
Try to work on sticking to diabetic diet  Work on weight loss as well  If you are open to it -more exercise may help also  Please get in touch in with your insurance company and find out / send me a list of covered medicines for diabetes - then I will pick one (IF you are open to more medicine)  Follow up in 3 months with labs prior

## 2014-03-01 NOTE — Progress Notes (Signed)
Subjective:    Patient ID: Ryan Garcia, male    DOB: 04-29-1934, 80 y.o.   MRN: [id number]  HPI Here for f/u of chronic medical problems   bp is stable today  No cp or palpitations or headaches or edema  No side effects to medicines  BP Readings from Last 3 Encounters:  03/01/14 108/64  01/14/14 140/60  11/27/13 126/60      Wt is down 1 lb with bmi of 34  Diabetes Home sugar results - are too high -some in 160s-180s or even up to 250 DM diet -is not that bad  Exercise - was using a treadmill for a while 30 min , seven days per week -and it made no improvement - he is no longer doing it   Symptoms A1C last  Lab Results  Component Value Date   HGBA1C 7.6* 02/26/2014  up from 7.3   No problems with medications  Renal protection ace  Last eye exam 11/14   Patient Active Problem List   Diagnosis Date Noted  . SOB (shortness of breath) on exertion 11/27/2013  . Encounter for Medicare annual wellness exam 05/29/2013  . Prostate cancer screening 07/15/2012  . Abnormal chest x-ray 05/24/2011  . CAD (coronary artery disease) 05/17/2011  . Chest pain 04/30/2011  . Abnormal Stress Myoview 04/30/2011  . Arm pain, left 04/14/2011  . Upper back pain, chronic 04/06/2011  . Abnormal EKG 04/06/2011  . Left arm pain 04/06/2011  . BACK PAIN 01/13/2010  . DIABETES MELLITUS, TYPE II 10/04/2006  . HYPERCHOLESTEROLEMIA 10/04/2006  . OBESITY 10/04/2006  . ERECTILE DYSFUNCTION 10/04/2006  . HYPERTENSION 10/04/2006  . ALLERGIC RHINITIS 10/04/2006  . GERD 10/04/2006  . OSTEOARTHRITIS 10/04/2006  . SLEEP APNEA 10/04/2006  . EDEMA 10/04/2006  . SKIN CANCER, HX OF 10/04/2006   Past Medical History  Diagnosis Date  . Allergy     allergic rhinitis  . Hx of skin cancer, basal cell     many skin cancers removed/under constant treatment.  . Diabetes mellitus     type II x8 years  . GERD (gastroesophageal reflux disease)   . Hypertension   . Arthritis     OA  . BPH (benign  prostatic hyperplasia)     microwave tx of prostate  . AC (acromioclavicular) joint bone spurs     lt ankle  . HLD (hyperlipidemia)     10/12: TC 208, TG 213, HDL 36, LDL 129  . CAD (coronary artery disease)     Myoview 04/16/11: mild apical ischemia, study not gated.    . Scoliosis   . Chronic upper back pain   . Obstructive sleep apnea on CPAP    Past Surgical History  Procedure Laterality Date  . Breast surgery  2009    breast lump removed benign   History  Substance Use Topics  . Smoking status: Never Smoker   . Smokeless tobacco: Never Used  . Alcohol Use: No   Family History  Problem Relation Age of Onset  . Heart attack Brother    Allergies  Allergen Reactions  . Loratadine     REACTION: not effective  . Simvastatin     REACTION: intolerant   Current Outpatient Prescriptions on File Prior to Visit  Medication Sig Dispense Refill  . aspirin 81 MG tablet Take 81 mg by mouth daily.        . cetirizine (ZYRTEC) 10 MG tablet TAKE ONE TABLET BY MOUTH EVERY DAY  90 tablet  1  . Flax OIL Take 1 capsule by mouth daily.        Marland Kitchen glipiZIDE (GLUCOTROL) 10 MG tablet TAKE ONE TABLET BY MOUTH TWICE A DAY WITH MEALS.  60 tablet  5  . glucose blood (FREESTYLE LITE) test strip Check blood sugar twice daily and as needed. 250.0  100 each    . hydrochlorothiazide (HYDRODIURIL) 25 MG tablet TAKE ONE TABLET BY MOUTH EVERY DAY  90 tablet  1  . Lancets (FREESTYLE) lancets Check glucose once daily and as needed.250.0       . lisinopril (PRINIVIL,ZESTRIL) 20 MG tablet TAKE ONE TABLET BY MOUTH EVERY DAY  90 tablet  1  . metFORMIN (GLUCOPHAGE) 1000 MG tablet TAKE ONE TABLET BY MOUTH TWICE A DAY WITH MEALS  180 tablet  0  . nitroGLYCERIN (NITROSTAT) 0.4 MG SL tablet Place 1 tablet (0.4 mg total) under the tongue every 5 (five) minutes as needed for chest pain.  25 tablet  6  . Omega-3 Fatty Acids (FISH OIL PO) Take 2 capsules by mouth daily.        . pravastatin (PRAVACHOL) 80 MG tablet Take  1 tablet (80 mg total) by mouth every evening.  90 tablet  3  . PRESCRIPTION MEDICATION Take 1 tablet by mouth daily. "Accelerate study" drug from [place]      . triamcinolone (NASACORT) 55 MCG/ACT AERO nasal inhaler USE ONE SPRAYS EACH NOSTRIL EVERY DAY       No current facility-administered medications on file prior to visit.     Review of Systems Review of Systems  Constitutional: Negative for fever, appetite change, fatigue and unexpected weight change.  Eyes: Negative for pain and visual disturbance.  Respiratory: Negative for cough and shortness of breath.   Cardiovascular: Negative for cp or palpitations    Gastrointestinal: Negative for nausea, diarrhea and constipation.  Genitourinary: Negative for urgency and frequency. neg for excessive thirst or urination  Skin: Negative for pallor or rash   Neurological: Negative for weakness, light-headedness, numbness and headaches.  Hematological: Negative for adenopathy. Does not bruise/bleed easily.  Psychiatric/Behavioral: Negative for dysphoric mood. The patient is not nervous/anxious.         Objective:   Physical Exam  Constitutional: He appears well-developed and well-nourished. No distress.  obese and well appearing   HENT:  Head: Normocephalic and atraumatic.  Mouth/Throat: Oropharynx is clear and moist.  Eyes: Conjunctivae and EOM are normal. Pupils are equal, round, and reactive to light.  Neck: Normal range of motion. Neck supple. No JVD present. Carotid bruit is not present.  Cardiovascular: Normal rate, regular rhythm and intact distal pulses.  Exam reveals no gallop.   Pulmonary/Chest: Effort normal and breath sounds normal. No respiratory distress. He has no wheezes. He has no rales.  Musculoskeletal: He exhibits no edema.  Lymphadenopathy:    He has no cervical adenopathy.  Neurological: He is alert. He has normal reflexes. No cranial nerve deficit. He exhibits normal muscle tone. Coordination normal.  Skin:  Skin is warm and dry. No rash noted. No erythema. No pallor.  Psychiatric: He has a normal mood and affect.          Assessment & Plan:   Problem List Items Addressed This Visit     Cardiovascular and Mediastinum   HYPERTENSION - Primary      bp in fair control at this time  BP Readings from Last 1 Encounters:  03/01/14 108/64   No changes needed Disc lifstyle change with  low sodium diet and exercise        Endocrine   DIABETES MELLITUS, TYPE II      Lab Results  Component Value Date   HGBA1C 7.6* 02/26/2014   Flu shot today  Is worse since last check -was 7.3  Disc DM diet - he is not following that  Also disc need for wt loss If interested in adding med will get me a list of covered alt from ins (may benefit from basal insulin or other)     Other Visit Diagnoses   Need for prophylactic vaccination and inoculation against influenza        Relevant Orders       Flu Vaccine QUAD 36+ mos PF IM (Fluarix Quad PF) (Completed)

## 2014-03-01 NOTE — Assessment & Plan Note (Signed)
bp in fair control at this time  BP Readings from Last 1 Encounters:  03/01/14 108/64   No changes needed Disc lifstyle change with low sodium diet and exercise

## 2014-03-01 NOTE — Progress Notes (Signed)
Pre visit review using our clinic review tool, if applicable. No additional management support is needed unless otherwise documented below in the visit note. 

## 2014-03-01 NOTE — Assessment & Plan Note (Signed)
Lab Results  Component Value Date   HGBA1C 7.6* 02/26/2014   Flu shot today  Is worse since last check -was 7.3  Disc DM diet - he is not following that  Also disc need for wt loss If interested in adding med will get me a list of covered alt from ins (may benefit from basal insulin or other)

## 2014-03-16 ENCOUNTER — Other Ambulatory Visit: Payer: Self-pay | Admitting: Family Medicine

## 2014-03-16 DIAGNOSIS — C44621 Squamous cell carcinoma of skin of unspecified upper limb, including shoulder: Secondary | ICD-10-CM | POA: Diagnosis not present

## 2014-03-30 DIAGNOSIS — D0460 Carcinoma in situ of skin of unspecified upper limb, including shoulder: Secondary | ICD-10-CM | POA: Diagnosis not present

## 2014-04-12 DIAGNOSIS — E119 Type 2 diabetes mellitus without complications: Secondary | ICD-10-CM | POA: Diagnosis not present

## 2014-04-12 LAB — HM DIABETES EYE EXAM

## 2014-04-20 ENCOUNTER — Encounter: Payer: Self-pay | Admitting: Family Medicine

## 2014-05-06 ENCOUNTER — Other Ambulatory Visit: Payer: Self-pay | Admitting: Family Medicine

## 2014-05-07 ENCOUNTER — Other Ambulatory Visit: Payer: Self-pay

## 2014-05-07 MED ORDER — LISINOPRIL 20 MG PO TABS: ORAL_TABLET | ORAL | Status: DC

## 2014-05-23 ENCOUNTER — Telehealth: Payer: Self-pay | Admitting: Family Medicine

## 2014-05-23 DIAGNOSIS — E119 Type 2 diabetes mellitus without complications: Secondary | ICD-10-CM

## 2014-05-23 NOTE — Telephone Encounter (Signed)
-----   Message from Ellamae Sia sent at 05/20/2014 12:49 PM EST ----- Regarding: Lab orders for 11.23.15  Lab orders for 3 month f/u labs

## 2014-05-24 ENCOUNTER — Other Ambulatory Visit (INDEPENDENT_AMBULATORY_CARE_PROVIDER_SITE_OTHER): Payer: Medicare Other

## 2014-05-24 DIAGNOSIS — E119 Type 2 diabetes mellitus without complications: Secondary | ICD-10-CM | POA: Diagnosis not present

## 2014-05-24 LAB — BASIC METABOLIC PANEL
BUN: 21 mg/dL (ref 6–23)
CO2: 27 mEq/L (ref 19–32)
CREATININE: 1.2 mg/dL (ref 0.4–1.5)
Calcium: 9 mg/dL (ref 8.4–10.5)
Chloride: 104 mEq/L (ref 96–112)
GFR: 62.19 mL/min (ref >60.00)
Glucose, Bld: 203 mg/dL — ABNORMAL HIGH (ref 70–99)
Potassium: 4.2 mEq/L (ref 3.5–5.1)
SODIUM: 138 meq/L (ref 135–145)

## 2014-05-24 LAB — HEMOGLOBIN A1C: HEMOGLOBIN A1C: 7.8 % — AB (ref 4.6–6.5)

## 2014-05-25 ENCOUNTER — Other Ambulatory Visit: Payer: Self-pay | Admitting: Family Medicine

## 2014-05-31 ENCOUNTER — Ambulatory Visit (INDEPENDENT_AMBULATORY_CARE_PROVIDER_SITE_OTHER): Payer: Medicare Other | Admitting: Family Medicine

## 2014-05-31 ENCOUNTER — Encounter: Payer: Self-pay | Admitting: Family Medicine

## 2014-05-31 VITALS — BP 102/56 | HR 60 | Temp 98.3°F | Ht 71.0 in | Wt 249.5 lb

## 2014-05-31 DIAGNOSIS — L570 Actinic keratosis: Secondary | ICD-10-CM | POA: Diagnosis not present

## 2014-05-31 DIAGNOSIS — I2510 Atherosclerotic heart disease of native coronary artery without angina pectoris: Secondary | ICD-10-CM | POA: Diagnosis not present

## 2014-05-31 DIAGNOSIS — E1165 Type 2 diabetes mellitus with hyperglycemia: Secondary | ICD-10-CM

## 2014-05-31 DIAGNOSIS — IMO0002 Reserved for concepts with insufficient information to code with codable children: Secondary | ICD-10-CM

## 2014-05-31 MED ORDER — INSULIN GLARGINE 100 UNITS/ML SOLOSTAR PEN: 30.0000 [IU] | PEN_INJECTOR | Freq: Every day | SUBCUTANEOUS | Status: DC

## 2014-05-31 NOTE — Patient Instructions (Addendum)
Start with lantus 10 units once daily in the evening for 1 week Then increase to 20 units once daily for a week Then increase to 30 units once daily  Follow up with me in 4 weeks  If glucose readings get too low (under 80)- please let me know and stop at that dose   Have the pharmacy call to let me know what brand/size of needles you will need for your insulin pen    Please think about improving diet and eating smaller portions

## 2014-05-31 NOTE — Progress Notes (Signed)
Subjective:    Patient ID: Ryan Garcia, male    DOB: [birth year], 80 y.o.   MRN: [id number]  HPI Here for f/u of DM2   Had a recent terrible month with uri symptoms - better - on the mend / still some cough  Wife had it too   Wt is up 1 lb with bmi of 34   Eating about the same   Diabetes Home sugar results - 200s usually DM diet - some of the time (eats too much) Exercise - normal everyday activities  Symptoms - no excessive thirst or urination (no more than usual)  A1C last  Lab Results  Component Value Date   HGBA1C 7.8* 05/24/2014    No problems with medications  Renal protection ace  Last eye exam  Up to date   Patient Active Problem List   Diagnosis Date Noted  . SOB (shortness of breath) on exertion 11/27/2013  . Encounter for Medicare annual wellness exam 05/29/2013  . Prostate cancer screening 07/15/2012  . Abnormal chest x-ray 05/24/2011  . CAD (coronary artery disease) 05/17/2011  . Chest pain 04/30/2011  . Abnormal Stress Myoview 04/30/2011  . Arm pain, left 04/14/2011  . Upper back pain, chronic 04/06/2011  . Abnormal EKG 04/06/2011  . Left arm pain 04/06/2011  . BACK PAIN 01/13/2010  . Diabetes type 2, controlled 10/04/2006  . HYPERCHOLESTEROLEMIA 10/04/2006  . OBESITY 10/04/2006  . ERECTILE DYSFUNCTION 10/04/2006  . HYPERTENSION 10/04/2006  . ALLERGIC RHINITIS 10/04/2006  . GERD 10/04/2006  . OSTEOARTHRITIS 10/04/2006  . SLEEP APNEA 10/04/2006  . EDEMA 10/04/2006  . SKIN CANCER, HX OF 10/04/2006   Past Medical History  Diagnosis Date  . Allergy     allergic rhinitis  . Hx of skin cancer, basal cell     many skin cancers removed/under constant treatment.  . Diabetes mellitus     type II x8 years  . GERD (gastroesophageal reflux disease)   . Hypertension   . Arthritis     OA  . BPH (benign prostatic hyperplasia)     microwave tx of prostate  . AC (acromioclavicular) joint bone spurs     lt ankle  . HLD (hyperlipidemia)     10/12:  TC 208, TG 213, HDL 36, LDL 129  . CAD (coronary artery disease)     Myoview 04/16/11: mild apical ischemia, study not gated.    . Scoliosis   . Chronic upper back pain   . Obstructive sleep apnea on CPAP    Past Surgical History  Procedure Laterality Date  . Breast surgery  2009    breast lump removed benign   History  Substance Use Topics  . Smoking status: Never Smoker   . Smokeless tobacco: Never Used  . Alcohol Use: No   Family History  Problem Relation Age of Onset  . Heart attack Brother    Allergies  Allergen Reactions  . Loratadine     REACTION: not effective  . Simvastatin     REACTION: intolerant   Current Outpatient Prescriptions on File Prior to Visit  Medication Sig Dispense Refill  . aspirin 81 MG tablet Take 81 mg by mouth daily.      . cetirizine (ZYRTEC) 10 MG tablet TAKE ONE TABLET BY MOUTH EVERY DAY 90 tablet 1  . Coenzyme Q10 (CO Q 10 PO) Take 1 tablet by mouth 2 (two) times daily.    . Flax OIL Take 1 capsule by mouth daily.      Marland Kitchen  glipiZIDE (GLUCOTROL) 10 MG tablet TAKE ONE TABLET BY MOUTH TWICE A DAY WITH MEALS 60 tablet 0  . glucose blood (FREESTYLE LITE) test strip Check blood sugar twice daily and as needed. 250.0 100 each   . hydrochlorothiazide (HYDRODIURIL) 25 MG tablet TAKE ONE TABLET BY MOUTH EVERY DAY 90 tablet 0  . Lancets (FREESTYLE) lancets Check glucose once daily and as needed.250.0     . lisinopril (PRINIVIL,ZESTRIL) 20 MG tablet TAKE ONE TABLET BY MOUTH EVERY DAY 90 tablet 1  . metFORMIN (GLUCOPHAGE) 1000 MG tablet TAKE ONE TABLET BY MOUTH TWICE DAILY 180 tablet 0  . nitroGLYCERIN (NITROSTAT) 0.4 MG SL tablet Place 1 tablet (0.4 mg total) under the tongue every 5 (five) minutes as needed for chest pain. 25 tablet 6  . pravastatin (PRAVACHOL) 80 MG tablet Take 1 tablet (80 mg total) by mouth every evening. 90 tablet 3  . triamcinolone (NASACORT) 55 MCG/ACT AERO nasal inhaler USE ONE SPRAYS EACH NOSTRIL EVERY DAY     No current  facility-administered medications on file prior to visit.        Review of Systems Review of Systems  Constitutional: Negative for fever, appetite change, fatigue and unexpected weight change.  Eyes: Negative for pain and visual disturbance.  Respiratory: Negative for cough and shortness of breath.   Cardiovascular: Negative for cp or palpitations    Gastrointestinal: Negative for nausea, diarrhea and constipation.  Genitourinary: Negative for urgency and frequency. eg for excessive thirst  Skin: Negative for pallor or rash   Neurological: Negative for weakness, light-headedness, numbness and headaches.  Hematological: Negative for adenopathy. Does not bruise/bleed easily.  Psychiatric/Behavioral: Negative for dysphoric mood. The patient is not nervous/anxious.         Objective:   Physical Exam  Constitutional: He appears well-developed and well-nourished. No distress.  obese and well appearing   HENT:  Head: Normocephalic and atraumatic.  Mouth/Throat: Oropharynx is clear and moist.  Eyes: Conjunctivae and EOM are normal. Pupils are equal, round, and reactive to light. No scleral icterus.  Neck: Normal range of motion. Neck supple. No JVD present.  Cardiovascular: Normal rate, regular rhythm, normal heart sounds and intact distal pulses.  Exam reveals no gallop.   Pulmonary/Chest: Effort normal. No respiratory distress. He has no wheezes. He has no rales.  Abdominal: Soft. Bowel sounds are normal. He exhibits no distension.  Musculoskeletal: He exhibits no edema.  Lymphadenopathy:    He has no cervical adenopathy.  Neurological: He is alert. He has normal reflexes.  Skin: Skin is warm and dry. No rash noted. No erythema. No pallor.  Psychiatric: He has a normal mood and affect.          Assessment & Plan:   Problem List Items Addressed This Visit      Other   Diabetes type 2, uncontrolled - Primary    Uncontrolled with glucoses in 200s and  Lab Results  Component  Value Date   HGBA1C 7.8* 05/24/2014    Pt refuses to alter diet  Will begin lantus 10 u -titrate to 30 u (unless glucose readings are too low)  Disc lifestyle change-pt not interested F/u 1 mo     Relevant Medications      insulin glargine (LANTUS) 100 unit/mL SOPN

## 2014-05-31 NOTE — Assessment & Plan Note (Signed)
Uncontrolled with glucoses in 200s and  Lab Results  Component Value Date   HGBA1C 7.8* 05/24/2014    Pt refuses to alter diet  Will begin lantus 10 u -titrate to 30 u (unless glucose readings are too low)  Disc lifestyle change-pt not interested F/u 1 mo

## 2014-06-16 ENCOUNTER — Other Ambulatory Visit: Payer: Self-pay | Admitting: Family Medicine

## 2014-06-21 ENCOUNTER — Encounter: Payer: Self-pay | Admitting: Cardiology

## 2014-06-28 ENCOUNTER — Ambulatory Visit (INDEPENDENT_AMBULATORY_CARE_PROVIDER_SITE_OTHER): Payer: Medicare Other | Admitting: Family Medicine

## 2014-06-28 ENCOUNTER — Encounter: Payer: Self-pay | Admitting: Family Medicine

## 2014-06-28 VITALS — BP 104/66 | HR 66 | Temp 98.1°F | Ht 71.0 in | Wt 250.2 lb

## 2014-06-28 DIAGNOSIS — E1165 Type 2 diabetes mellitus with hyperglycemia: Secondary | ICD-10-CM | POA: Diagnosis not present

## 2014-06-28 DIAGNOSIS — IMO0002 Reserved for concepts with insufficient information to code with codable children: Secondary | ICD-10-CM

## 2014-06-28 DIAGNOSIS — I2510 Atherosclerotic heart disease of native coronary artery without angina pectoris: Secondary | ICD-10-CM

## 2014-06-28 MED ORDER — GLIPIZIDE 10 MG PO TABS: 10.0000 mg | ORAL_TABLET | Freq: Two times a day (BID) | ORAL | Status: DC

## 2014-06-28 MED ORDER — METFORMIN HCL 1000 MG PO TABS: 1000.0000 mg | ORAL_TABLET | Freq: Two times a day (BID) | ORAL | Status: DC

## 2014-06-28 NOTE — Progress Notes (Signed)
Pre visit review using our clinic review tool, if applicable. No additional management support is needed unless otherwise documented below in the visit note. 

## 2014-06-28 NOTE — Assessment & Plan Note (Signed)
Improvement noted with lantus 30  No hypoglycemia  Pt expects diet to be better now that that holidays are over as well Enc activity  F/u 3 mo with A1c prior

## 2014-06-28 NOTE — Progress Notes (Signed)
Subjective:    Patient ID: Ryan Garcia, male    DOB: 1933-11-02, 80 y.o.   MRN: [id number]  HPI Here for f/u of DM   Last visit we started lantus at 10 u to titrate up to 30 as tolerated Is going ok  10-no difference, 20 a little bit  30 some improvement   Dec is not a good time to check it - due to holiday diet  This his glucose is down 20 -30 points from before  Glucose readings are running 150s (much better than the 200s) occ higher than that  It is working   Plans to get back to better habits soon - family events are over   On glipizide and metformin   Wt is stable with bmi of 34  BP: 104/66 mmHg   Patient Active Problem List   Diagnosis Date Noted  . SOB (shortness of breath) on exertion 11/27/2013  . Encounter for Medicare annual wellness exam 05/29/2013  . Prostate cancer screening 07/15/2012  . Abnormal chest x-ray 05/24/2011  . CAD (coronary artery disease) 05/17/2011  . Chest pain 04/30/2011  . Abnormal Stress Myoview 04/30/2011  . Arm pain, left 04/14/2011  . Upper back pain, chronic 04/06/2011  . Abnormal EKG 04/06/2011  . Left arm pain 04/06/2011  . BACK PAIN 01/13/2010  . Diabetes type 2, uncontrolled 10/04/2006  . HYPERCHOLESTEROLEMIA 10/04/2006  . OBESITY 10/04/2006  . ERECTILE DYSFUNCTION 10/04/2006  . HYPERTENSION 10/04/2006  . ALLERGIC RHINITIS 10/04/2006  . GERD 10/04/2006  . OSTEOARTHRITIS 10/04/2006  . SLEEP APNEA 10/04/2006  . EDEMA 10/04/2006  . SKIN CANCER, HX OF 10/04/2006   Past Medical History  Diagnosis Date  . Allergy     allergic rhinitis  . Hx of skin cancer, basal cell     many skin cancers removed/under constant treatment.  . Diabetes mellitus     type II x8 years  . GERD (gastroesophageal reflux disease)   . Hypertension   . Arthritis     OA  . BPH (benign prostatic hyperplasia)     microwave tx of prostate  . AC (acromioclavicular) joint bone spurs     lt ankle  . HLD (hyperlipidemia)     10/12: TC 208, TG  213, HDL 36, LDL 129  . CAD (coronary artery disease)     Myoview 04/16/11: mild apical ischemia, study not gated.    . Scoliosis   . Chronic upper back pain   . Obstructive sleep apnea on CPAP    Past Surgical History  Procedure Laterality Date  . Breast surgery  2009    breast lump removed benign   History  Substance Use Topics  . Smoking status: Never Smoker   . Smokeless tobacco: Never Used  . Alcohol Use: No   Family History  Problem Relation Age of Onset  . Heart attack Brother    Allergies  Allergen Reactions  . Loratadine     REACTION: not effective  . Simvastatin     REACTION: intolerant   Current Outpatient Prescriptions on File Prior to Visit  Medication Sig Dispense Refill  . aspirin 81 MG tablet Take 81 mg by mouth daily.      . cetirizine (ZYRTEC) 10 MG tablet TAKE ONE TABLET BY MOUTH EVERY DAY 90 tablet 1  . Cinnamon 500 MG capsule Take 500 mg by mouth 2 (two) times daily.    . Coenzyme Q10 (CO Q 10 PO) Take 1 tablet by mouth 2 (two) times  daily.    . Flax OIL Take 1 capsule by mouth daily.      Marland Kitchen glipiZIDE (GLUCOTROL) 10 MG tablet TAKE ONE TABLET BY MOUTH TWICE A DAY WITH MEALS 60 tablet 0  . glucose blood (FREESTYLE LITE) test strip Check blood sugar twice daily and as needed. 250.0 100 each   . hydrochlorothiazide (HYDRODIURIL) 25 MG tablet TAKE ONE TABLET BY MOUTH EVERY DAY 90 tablet 0  . insulin glargine (LANTUS) 100 unit/mL SOPN Inject 0.3 mLs (30 Units total) into the skin at bedtime. 15 mL 3  . Lancets (FREESTYLE) lancets Check glucose once daily and as needed.250.0     . lisinopril (PRINIVIL,ZESTRIL) 20 MG tablet TAKE ONE TABLET BY MOUTH EVERY DAY 90 tablet 1  . metFORMIN (GLUCOPHAGE) 1000 MG tablet TAKE ONE TABLET BY MOUTH TWICE DAILY 180 tablet 0  . nitroGLYCERIN (NITROSTAT) 0.4 MG SL tablet Place 1 tablet (0.4 mg total) under the tongue every 5 (five) minutes as needed for chest pain. 25 tablet 6  . pravastatin (PRAVACHOL) 80 MG tablet Take 1  tablet (80 mg total) by mouth every evening. 90 tablet 3  . triamcinolone (NASACORT) 55 MCG/ACT AERO nasal inhaler USE ONE SPRAYS EACH NOSTRIL EVERY DAY     No current facility-administered medications on file prior to visit.     Review of Systems Review of Systems  Constitutional: Negative for fever, appetite change, fatigue and unexpected weight change.  Eyes: Negative for pain and visual disturbance.  Respiratory: Negative for cough and shortness of breath.   Cardiovascular: Negative for cp or palpitations    Gastrointestinal: Negative for nausea, diarrhea and constipation.  Genitourinary: Negative for urgency and frequency.  Skin: Negative for pallor or rash   Neurological: Negative for weakness, light-headedness, numbness and headaches.  Hematological: Negative for adenopathy. Does not bruise/bleed easily.  Psychiatric/Behavioral: Negative for dysphoric mood. The patient is not nervous/anxious.         Objective:   Physical Exam  Constitutional: He appears well-developed and well-nourished. No distress.  obese and well appearing   HENT:  Head: Normocephalic and atraumatic.  Mouth/Throat: Oropharynx is clear and moist.  Eyes: Conjunctivae and EOM are normal. Pupils are equal, round, and reactive to light. No scleral icterus.  Neck: Normal range of motion. Neck supple. No JVD present. Carotid bruit is not present.  Cardiovascular: Normal rate, regular rhythm and intact distal pulses.   Pulmonary/Chest: Effort normal and breath sounds normal. No respiratory distress. He has no wheezes. He has no rales.  Lymphadenopathy:    He has no cervical adenopathy.  Neurological: He is alert.  Skin: Skin is warm and dry. No rash noted. No pallor.  Psychiatric: He has a normal mood and affect.          Assessment & Plan:   Problem List Items Addressed This Visit      Other   Diabetes type 2, uncontrolled - Primary    Improvement noted with lantus 30  No hypoglycemia  Pt  expects diet to be better now that that holidays are over as well Enc activity  F/u 3 mo with A1c prior    Relevant Medications      metFORMIN (GLUCOPHAGE) tablet      glipiZIDE (GLUCOTROL) tablet   Other Relevant Orders      Hemoglobin A1c

## 2014-06-28 NOTE — Patient Instructions (Signed)
Continue to take your lantus 30 mg daily  Take care of yourself- try to get back to better eating and stay active  Follow up in 3 months with labs prior

## 2014-06-29 DIAGNOSIS — X32XXXA Exposure to sunlight, initial encounter: Secondary | ICD-10-CM | POA: Diagnosis not present

## 2014-06-29 DIAGNOSIS — L570 Actinic keratosis: Secondary | ICD-10-CM | POA: Diagnosis not present

## 2014-06-30 ENCOUNTER — Other Ambulatory Visit: Payer: Self-pay | Admitting: Family Medicine

## 2014-07-08 DIAGNOSIS — L570 Actinic keratosis: Secondary | ICD-10-CM | POA: Diagnosis not present

## 2014-07-08 DIAGNOSIS — Z85828 Personal history of other malignant neoplasm of skin: Secondary | ICD-10-CM | POA: Diagnosis not present

## 2014-07-08 DIAGNOSIS — L821 Other seborrheic keratosis: Secondary | ICD-10-CM | POA: Diagnosis not present

## 2014-07-08 DIAGNOSIS — X32XXXA Exposure to sunlight, initial encounter: Secondary | ICD-10-CM | POA: Diagnosis not present

## 2014-07-23 ENCOUNTER — Other Ambulatory Visit: Payer: Self-pay | Admitting: Family Medicine

## 2014-09-15 ENCOUNTER — Other Ambulatory Visit: Payer: Self-pay | Admitting: Family Medicine

## 2014-09-16 ENCOUNTER — Other Ambulatory Visit: Payer: Self-pay

## 2014-09-16 MED ORDER — PRAVASTATIN SODIUM 80 MG PO TABS: 80.0000 mg | ORAL_TABLET | Freq: Every evening | ORAL | Status: DC

## 2014-09-20 ENCOUNTER — Telehealth: Payer: Self-pay

## 2014-09-20 MED ORDER — FREESTYLE SYSTEM KIT: 1.0000 | PACK | Status: DC | PRN

## 2014-09-20 MED ORDER — GLUCOSE BLOOD VI STRP
ORAL_STRIP | Status: DC
Start: 2014-09-20 — End: 2015-03-17

## 2014-09-20 MED ORDER — FREESTYLE LANCETS MISC
Status: DC
Start: 2014-09-20 — End: 2015-03-17

## 2014-09-20 NOTE — Telephone Encounter (Signed)
Mrs Osley left v/m; [facility] notified pt that pts ins does not cover the diabetic test strips that pt has been using. Pt has 3 days of strips and pt is going out of town on 09/23/14. Mrs Brix will ck with [employer] to see which diabetic test strips are approved and will notify [facility].

## 2014-09-20 NOTE — Telephone Encounter (Signed)
Diabetic testing supplies sent to pharmacy

## 2014-09-20 NOTE — Telephone Encounter (Signed)
Pt wife called back to say pt is covered under Part B. And any test strips and monitor can be called in. Please advise.  [place]

## 2014-09-20 NOTE — Addendum Note (Signed)
Addended by: Amado Coe on: 09/20/2014 11:37 AM   Modules accepted: Orders

## 2014-09-23 ENCOUNTER — Other Ambulatory Visit (INDEPENDENT_AMBULATORY_CARE_PROVIDER_SITE_OTHER): Payer: Commercial Managed Care - HMO

## 2014-09-23 DIAGNOSIS — E1165 Type 2 diabetes mellitus with hyperglycemia: Secondary | ICD-10-CM | POA: Diagnosis not present

## 2014-09-23 DIAGNOSIS — IMO0002 Reserved for concepts with insufficient information to code with codable children: Secondary | ICD-10-CM

## 2014-09-23 LAB — HEMOGLOBIN A1C: Hgb A1c MFr Bld: 7.4 % — ABNORMAL HIGH (ref 4.6–6.5)

## 2014-09-27 ENCOUNTER — Ambulatory Visit: Payer: Medicare Other | Admitting: Family Medicine

## 2014-10-04 ENCOUNTER — Ambulatory Visit (INDEPENDENT_AMBULATORY_CARE_PROVIDER_SITE_OTHER): Payer: Commercial Managed Care - HMO | Admitting: Family Medicine

## 2014-10-04 ENCOUNTER — Encounter: Payer: Self-pay | Admitting: Family Medicine

## 2014-10-04 VITALS — BP 128/64 | HR 71 | Temp 98.4°F | Ht 71.0 in | Wt 254.0 lb

## 2014-10-04 DIAGNOSIS — IMO0002 Reserved for concepts with insufficient information to code with codable children: Secondary | ICD-10-CM

## 2014-10-04 DIAGNOSIS — E1165 Type 2 diabetes mellitus with hyperglycemia: Secondary | ICD-10-CM

## 2014-10-04 DIAGNOSIS — Z23 Encounter for immunization: Secondary | ICD-10-CM

## 2014-10-04 NOTE — Progress Notes (Signed)
Subjective:    Patient ID: Ryan Garcia, male    DOB: 12-Apr-1934, 81 y.o.   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 SOB (shortness of breath) on exertion 11/27/2013  . Encounter for Medicare annual wellness exam 05/29/2013  . Prostate cancer screening 07/15/2012  . Abnormal chest x-ray 05/24/2011  . CAD (coronary artery disease) 05/17/2011  . Chest pain 04/30/2011  . Abnormal Stress Myoview 04/30/2011  . Arm pain, left 04/14/2011  . Upper back pain, chronic 04/06/2011  . Abnormal EKG 04/06/2011  . Left arm pain 04/06/2011  . BACK PAIN 01/13/2010  . Diabetes type 2, uncontrolled 10/04/2006  . HYPERCHOLESTEROLEMIA 10/04/2006  . OBESITY 10/04/2006  . ERECTILE DYSFUNCTION 10/04/2006  . HYPERTENSION 10/04/2006  . ALLERGIC RHINITIS 10/04/2006  . GERD 10/04/2006  . OSTEOARTHRITIS 10/04/2006  . SLEEP APNEA 10/04/2006  . EDEMA 10/04/2006  . SKIN CANCER, HX OF 10/04/2006   Past Medical History  Diagnosis Date  . Allergy     allergic  rhinitis  . Hx of skin cancer, basal cell     many skin cancers removed/under constant treatment.  . Diabetes mellitus     type II x8 years  . GERD (gastroesophageal reflux disease)   . Hypertension   . Arthritis     OA  . BPH (benign prostatic hyperplasia)     microwave tx of prostate  . AC (acromioclavicular) joint bone spurs     lt ankle  . HLD (hyperlipidemia)     10/12: TC 208, TG 213, HDL 36, LDL 129  . CAD (coronary artery disease)     Myoview 04/16/11: mild apical ischemia, study not gated.    . Scoliosis   . Chronic upper back pain   . Obstructive sleep apnea on CPAP    Past Surgical History  Procedure Laterality Date  . Breast surgery  2009    breast lump removed benign   History  Substance Use Topics  . Smoking status: Never Smoker   . Smokeless tobacco: Never Used  . Alcohol Use: No   Family History  Problem Relation Age of Onset  . Heart attack Brother    Allergies  Allergen Reactions  . Loratadine     REACTION: not effective  . Simvastatin     REACTION: intolerant   Current Outpatient Prescriptions on File Prior  to Visit  Medication Sig Dispense Refill  . aspirin 81 MG tablet Take 81 mg by mouth daily.      . cetirizine (ZYRTEC) 10 MG tablet TAKE ONE TABLET BY MOUTH EVERY DAY 90 tablet 0  . Coenzyme Q10 (CO Q 10 PO) Take 1 tablet by mouth 2 (two) times daily.    . Flax OIL Take 1 capsule by mouth daily.      Marland Kitchen glipiZIDE (GLUCOTROL) 10 MG tablet Take 1 tablet (10 mg total) by mouth 2 (two) times daily with a meal. 180 tablet 3  . glucose blood (FREESTYLE LITE) test strip Check blood sugar twice daily and as needed. 250.0 100 each 11  . glucose monitoring kit (FREESTYLE) monitoring kit 1 each by Does not apply route as needed for other. 1 each 0  . hydrochlorothiazide (HYDRODIURIL) 25 MG tablet TAKE ONE TABLET BY MOUTH EVERY DAY 90 tablet 3  . insulin glargine (LANTUS) 100 unit/mL SOPN Inject 0.3 mLs (30 Units total) into the skin at bedtime. 15 mL 3    . Lancets (FREESTYLE) lancets Check glucose once daily and as needed.250.0 100 each 11  . lisinopril (PRINIVIL,ZESTRIL) 20 MG tablet TAKE ONE TABLET BY MOUTH EVERY DAY 90 tablet 1  . metFORMIN (GLUCOPHAGE) 1000 MG tablet Take 1 tablet (1,000 mg total) by mouth 2 (two) times daily. 180 tablet 3  . nitroGLYCERIN (NITROSTAT) 0.4 MG SL tablet Place 1 tablet (0.4 mg total) under the tongue every 5 (five) minutes as needed for chest pain. 25 tablet 6  . pravastatin (PRAVACHOL) 80 MG tablet Take 1 tablet (80 mg total) by mouth every evening. 90 tablet 1  . triamcinolone (NASACORT) 55 MCG/ACT AERO nasal inhaler USE ONE SPRAYS EACH NOSTRIL EVERY DAY     No current facility-administered medications on file prior to visit.      Review of Systems    Review of Systems  Constitutional: Negative for fever, appetite change, fatigue and unexpected weight change.  Eyes: Negative for pain and visual disturbance.  Respiratory: Negative for cough and shortness of breath.   Cardiovascular: Negative for cp or palpitations    Gastrointestinal: Negative for nausea, diarrhea and constipation.  Genitourinary: pos for urgency and frequency - ? Due to high sugar or prostate  Skin: Negative for pallor or rash   Neurological: Negative for weakness, light-headedness, numbness and headaches.  Hematological: Negative for adenopathy. Does not bruise/bleed easily.  Psychiatric/Behavioral: Negative for dysphoric mood. The patient is not nervous/anxious.      Objective:   Physical Exam  Constitutional: He appears well-developed and well-nourished. No distress.  obese and well appearing   HENT:  Head: Normocephalic and atraumatic.  Mouth/Throat: Oropharynx is clear and moist.  Eyes: Conjunctivae and EOM are normal. Pupils are equal, round, and reactive to light.  Neck: Normal range of motion. Neck supple. No JVD present. Carotid bruit is not present. No thyromegaly present.  Cardiovascular: Normal rate, regular  rhythm, normal heart sounds and intact distal pulses.  Exam reveals no gallop.   Pulmonary/Chest: Effort normal and breath sounds normal. No respiratory distress. He has no wheezes. He has no rales.  No crackles  Abdominal: He exhibits no abdominal bruit.  Musculoskeletal: He exhibits no edema.  Lymphadenopathy:    He has no cervical adenopathy.  Neurological: He is alert. He has normal reflexes.  Skin: Skin is warm and dry. No rash noted.  Psychiatric: He has a normal mood and affect.  Assessment & Plan:   Problem List Items Addressed This Visit      Other   Diabetes type 2, uncontrolled - Primary    Lab Results  Component Value Date   HGBA1C 7.4* 09/23/2014   Improved but not at goal Pt refuses to inc lantus at this time -on 30 u  Will work on diet Much more active in the spring  utd eye exam  F/u 3 mo with lab prior         Other Visit Diagnoses    Need for prophylactic vaccination against Streptococcus pneumoniae (pneumococcus)        Relevant Orders    Pneumococcal conjugate vaccine 13-valent (Completed)

## 2014-10-04 NOTE — Assessment & Plan Note (Signed)
Lab Results  Component Value Date   HGBA1C 7.4* 09/23/2014   Improved but not at goal Pt refuses to inc lantus at this time -on 30 u  Will work on diet Much more active in the spring  utd eye exam  F/u 3 mo with lab prior

## 2014-10-04 NOTE — Patient Instructions (Signed)
prevnar vaccine today  Keep working on diabetic diet  Continue the lantus at 30 units - let me know if your glucose readings go up and if you want to increase that  Stay as active as you can Work on weight loss   Follow up in 3 months with labs prior

## 2014-10-04 NOTE — Progress Notes (Signed)
Pre visit review using our clinic review tool, if applicable. No additional management support is needed unless otherwise documented below in the visit note. 

## 2014-11-22 ENCOUNTER — Other Ambulatory Visit: Payer: Self-pay | Admitting: Family Medicine

## 2014-11-23 ENCOUNTER — Other Ambulatory Visit: Payer: Self-pay | Admitting: *Deleted

## 2014-11-23 MED ORDER — LISINOPRIL 20 MG PO TABS: ORAL_TABLET | ORAL | Status: DC

## 2014-11-28 ENCOUNTER — Other Ambulatory Visit: Payer: Self-pay | Admitting: Family Medicine

## 2014-12-28 ENCOUNTER — Other Ambulatory Visit: Payer: Commercial Managed Care - HMO

## 2014-12-30 ENCOUNTER — Other Ambulatory Visit (INDEPENDENT_AMBULATORY_CARE_PROVIDER_SITE_OTHER): Payer: Commercial Managed Care - HMO

## 2014-12-30 DIAGNOSIS — IMO0002 Reserved for concepts with insufficient information to code with codable children: Secondary | ICD-10-CM

## 2014-12-30 DIAGNOSIS — E1165 Type 2 diabetes mellitus with hyperglycemia: Secondary | ICD-10-CM | POA: Diagnosis not present

## 2014-12-30 LAB — LIPID PANEL
CHOL/HDL RATIO: 4
CHOLESTEROL: 122 mg/dL (ref 0–200)
HDL: 29.5 mg/dL — ABNORMAL LOW (ref >39.00)
LDL Cholesterol: 68 mg/dL (ref 0–99)
NONHDL: 92.5
TRIGLYCERIDES: 124 mg/dL (ref 0.0–149.0)
VLDL: 24.8 mg/dL (ref 0.0–40.0)

## 2014-12-30 LAB — COMPREHENSIVE METABOLIC PANEL
ALK PHOS: 80 U/L (ref 39–117)
ALT: 21 U/L (ref 0–53)
AST: 15 U/L (ref 0–37)
Albumin: 3.9 g/dL (ref 3.5–5.2)
BILIRUBIN TOTAL: 0.5 mg/dL (ref 0.2–1.2)
BUN: 24 mg/dL — AB (ref 6–23)
CALCIUM: 9.3 mg/dL (ref 8.4–10.5)
CHLORIDE: 106 meq/L (ref 96–112)
CO2: 29 meq/L (ref 19–32)
Creatinine, Ser: 1.28 mg/dL (ref 0.40–1.50)
GFR: 57.64 mL/min — AB (ref >60.00)
Glucose, Bld: 164 mg/dL — ABNORMAL HIGH (ref 70–99)
POTASSIUM: 4.3 meq/L (ref 3.5–5.1)
SODIUM: 140 meq/L (ref 135–145)
Total Protein: 6.7 g/dL (ref 6.0–8.3)

## 2014-12-30 LAB — HEMOGLOBIN A1C: Hgb A1c MFr Bld: 6.7 % — ABNORMAL HIGH (ref 4.6–6.5)

## 2015-01-04 ENCOUNTER — Ambulatory Visit: Payer: Commercial Managed Care - HMO | Admitting: Family Medicine

## 2015-01-12 ENCOUNTER — Ambulatory Visit (INDEPENDENT_AMBULATORY_CARE_PROVIDER_SITE_OTHER): Payer: Commercial Managed Care - HMO | Admitting: Family Medicine

## 2015-01-12 VITALS — BP 130/80 | HR 70 | Temp 98.4°F | Ht 71.0 in | Wt 253.5 lb

## 2015-01-12 DIAGNOSIS — E669 Obesity, unspecified: Secondary | ICD-10-CM | POA: Diagnosis not present

## 2015-01-12 DIAGNOSIS — I10 Essential (primary) hypertension: Secondary | ICD-10-CM | POA: Diagnosis not present

## 2015-01-12 DIAGNOSIS — E1165 Type 2 diabetes mellitus with hyperglycemia: Secondary | ICD-10-CM | POA: Diagnosis not present

## 2015-01-12 DIAGNOSIS — E7800 Pure hypercholesterolemia, unspecified: Secondary | ICD-10-CM

## 2015-01-12 DIAGNOSIS — IMO0002 Reserved for concepts with insufficient information to code with codable children: Secondary | ICD-10-CM

## 2015-01-12 NOTE — Progress Notes (Signed)
HPI: FU CAD. He was admitted 10/12 with chest pain . Myoview 04/16/11: mild apical ischemia, study not gated. Cath in Nov 2012 revealed normal LM. The LAD demonstrated a long 70-75% diffuse stenosis prior to the bifurcation. It was calcified. The diagonal had a 75% stenosis and there were tandem distal LAD lesions of 70-75%, with the vessel appearing typical for diabetes. The circumflex had a tiny ramus without disease, a moderately large OM1 with a 75% area of proximal disease. The AV vessel, leading to the OM2 had tandem 50% lesions. Both vessels appeared diabetic, but graftable. The RCA has a prox mid lesion of 60-70% . The PDA divides into two branches, and the larger branch has an 80% hypodense area of disease. This vessel is also graftable. EF 60. Dr Lia Foyer felt patient should have either medical therapy or CABG and not PCI. I reviewed films with Dr Percival Spanish and we elected medical therapy. Since he was last seen he notes dyspnea on exertion which is slightly worse. No orthopnea, PND, pedal edema or chest pain.  Current Outpatient Prescriptions  Medication Sig Dispense Refill  . aspirin 81 MG tablet Take 81 mg by mouth daily.      . Blood Glucose Monitoring Suppl (FREESTYLE LITE) DEVI     . cetirizine (ZYRTEC) 10 MG tablet TAKE ONE TABLET BY MOUTH EVERY DAY 90 tablet 1  . Coenzyme Q10 (CO Q 10 PO) Take 1 tablet by mouth 2 (two) times daily.    Marland Kitchen glipiZIDE (GLUCOTROL) 10 MG tablet Take 1 tablet (10 mg total) by mouth 2 (two) times daily with a meal. 180 tablet 3  . glucose blood (FREESTYLE LITE) test strip Check blood sugar twice daily and as needed. 250.0 100 each 11  . glucose monitoring kit (FREESTYLE) monitoring kit 1 each by Does not apply route as needed for other. 1 each 0  . hydrochlorothiazide (HYDRODIURIL) 25 MG tablet TAKE ONE TABLET BY MOUTH EVERY DAY 90 tablet 3  . Lancets (FREESTYLE) lancets Check glucose once daily and as needed.250.0 100 each 11  . LANTUS SOLOSTAR 100  UNIT/ML Solostar Pen INJECT 0.3 MLS (30 UNITS TOTAL) UNDER THE SKIN ONCE DAILY AT BEDTIME 15 mL 2  . lisinopril (PRINIVIL,ZESTRIL) 20 MG tablet TAKE ONE TABLET BY MOUTH EVERY DAY 90 tablet 0  . metFORMIN (GLUCOPHAGE) 1000 MG tablet Take 1 tablet (1,000 mg total) by mouth 2 (two) times daily. 180 tablet 3  . nitroGLYCERIN (NITROSTAT) 0.4 MG SL tablet Place 1 tablet (0.4 mg total) under the tongue every 5 (five) minutes as needed for chest pain. 25 tablet 6  . pravastatin (PRAVACHOL) 80 MG tablet Take 1 tablet (80 mg total) by mouth every evening. 90 tablet 1  . triamcinolone (NASACORT) 55 MCG/ACT AERO nasal inhaler USE ONE SPRAYS EACH NOSTRIL EVERY DAY     No current facility-administered medications for this visit.     Past Medical History  Diagnosis Date  . Allergy     allergic rhinitis  . Hx of skin cancer, basal cell     many skin cancers removed/under constant treatment.  . Diabetes mellitus     type II x8 years  . GERD (gastroesophageal reflux disease)   . Hypertension   . Arthritis     OA  . BPH (benign prostatic hyperplasia)     microwave tx of prostate  . AC (acromioclavicular) joint bone spurs     lt ankle  . HLD (hyperlipidemia)     10/12:  TC 208, TG 213, HDL 36, LDL 129  . CAD (coronary artery disease)     Myoview 04/16/11: mild apical ischemia, study not gated.    . Scoliosis   . Chronic upper back pain   . Obstructive sleep apnea on CPAP     Past Surgical History  Procedure Laterality Date  . Breast surgery  2009    breast lump removed benign    History   Social History  . Marital Status: Married    Spouse Name: N/A  . Number of Children: N/A  . Years of Education: N/A   Occupational History  . Not on file.   Social History Main Topics  . Smoking status: Never Smoker   . Smokeless tobacco: Never Used  . Alcohol Use: No  . Drug Use: No  . Sexual Activity: Not on file   Other Topics Concern  . Not on file   Social History Narrative   Married     Physically active hard of hearing    ROS: no fevers or chills, productive cough, hemoptysis, dysphasia, odynophagia, melena, hematochezia, dysuria, hematuria, rash, seizure activity, orthopnea, PND, pedal edema, claudication. Remaining systems are negative.  Physical Exam: Well-developed obese in no acute distress.  Skin is warm and dry.  HEENT is normal.  Neck is supple.  Chest is clear to auscultation with normal expansion.  Cardiovascular exam is regular rate and rhythm.  Abdominal exam nontender or distended. No masses palpated. Extremities show no edema. neuro grossly intact  ECG sinus rhythm with atrial bigeminy. Normal axis. Nonspecific ST changes.

## 2015-01-12 NOTE — Patient Instructions (Signed)
Blood sugar is improved Continue better eating and also work on weight loss  Continue current medicines   Make sure to get your annual eye exam scheduled   HDL cholesterol is still low and exercise helps that   Make an appt for your annual exam with labs prior after 05/30/15

## 2015-01-12 NOTE — Progress Notes (Signed)
Pre visit review using our clinic review tool, if applicable. No additional management support is needed unless otherwise documented below in the visit note. 

## 2015-01-12 NOTE — Progress Notes (Signed)
Subjective:    Patient ID: Ryan Garcia, male    DOB: [birth year], 81 y.o.   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   Wt is stable with bmi of 35  bp is stable today  No cp or palpitations or headaches or edema  No side effects to medicines  BP Readings from Last 3 Encounters:  01/12/15 138/64  10/04/14 128/64  06/28/14 104/66        Chemistry      Component Value Date/Time   NA 140 12/30/2014 0838   K 4.3 12/30/2014 0838   CL 106 12/30/2014 0838   CO2 29 12/30/2014 0838   BUN 24* 12/30/2014 0838   CREATININE 1.28 12/30/2014 0838      Component Value Date/Time   CALCIUM 9.3 12/30/2014 0838   ALKPHOS 80 12/30/2014 0838   AST 15 12/30/2014 0838   ALT 21 12/30/2014 0838   BILITOT 0.5 12/30/2014 0838      Hyperlipidemia Lab Results  Component Value Date   CHOL 122 12/30/2014   CHOL 134 10/08/2013   CHOL 135 05/25/2013   Lab Results  Component Value Date   HDL 29.50* 12/30/2014   HDL 32.80* 10/08/2013   HDL 34.10* 05/25/2013   Lab Results  Component Value Date   LDLCALC 68 12/30/2014   LDLCALC 76 10/08/2013   LDLCALC 76 05/25/2013   Lab Results  Component Value Date   TRIG 124.0 12/30/2014   TRIG 128.0 10/08/2013   TRIG 124.0 05/25/2013   Lab Results  Component Value Date   CHOLHDL 4 12/30/2014   CHOLHDL 4 10/08/2013   CHOLHDL 4 05/25/2013   Lab Results  Component Value Date   LDLDIRECT 78.5 11/17/2012   LDLDIRECT 156.5 05/09/2010   LDLDIRECT 152.9 08/09/2009   on 80 of pravastatin He quit taking fish oil and flax  HDL went down a little -but LDL is good   Diabetes Home sugar results - (higher on vacation he eats what he wants - 130s-230), when not on vacation usually 130-160  DM diet - is eating better and choosing better things (hard to keep motivation) Exercise - keeps moving working outdoors , goes as fast as he can /works at his own pace, does not have an  exercise program  Symptoms A1C last  Lab Results  Component Value Date   HGBA1C 6.7* 12/30/2014  this is down from 7.4   No problems with medications metformin and lantus and glipizide  Renal protection ace  Last eye exam - about due    Patient Active Problem List   Diagnosis Date Noted  . SOB (shortness of breath) on exertion 11/27/2013  . Encounter for Medicare annual wellness exam 05/29/2013  . Prostate cancer screening 07/15/2012  . Abnormal chest x-ray 05/24/2011  . CAD (coronary artery disease) 05/17/2011  . Chest pain 04/30/2011  . Abnormal Stress Myoview 04/30/2011  . Arm pain, left 04/14/2011  . Upper back pain, chronic 04/06/2011  . Abnormal EKG 04/06/2011  . Left arm pain 04/06/2011  . BACK PAIN 01/13/2010  . Diabetes type 2, uncontrolled 10/04/2006  . HYPERCHOLESTEROLEMIA 10/04/2006  . Obesity 10/04/2006  . ERECTILE DYSFUNCTION 10/04/2006  . Essential hypertension 10/04/2006  . ALLERGIC RHINITIS 10/04/2006  . GERD 10/04/2006  . OSTEOARTHRITIS 10/04/2006  . SLEEP APNEA 10/04/2006  . EDEMA 10/04/2006  . SKIN CANCER, HX OF 10/04/2006   Past Medical History  Diagnosis Date  . Allergy     allergic rhinitis  . Hx of skin cancer, basal cell     many skin cancers removed/under constant treatment.  . Diabetes mellitus     type II x8 years  . GERD (gastroesophageal reflux disease)   . Hypertension   . Arthritis     OA  . BPH (benign prostatic hyperplasia)     microwave tx of prostate  . AC (acromioclavicular) joint bone spurs     lt ankle  . HLD (hyperlipidemia)     10/12: TC 208, TG 213, HDL 36, LDL 129  . CAD (coronary artery disease)     Myoview 04/16/11: mild apical ischemia, study not gated.    . Scoliosis   . Chronic upper back pain   . Obstructive sleep apnea on CPAP    Past Surgical History  Procedure Laterality Date  . Breast surgery  2009    breast lump removed benign   History  Substance Use Topics  . Smoking status: Never Smoker   .  Smokeless tobacco: Never Used  . Alcohol Use: No   Family History  Problem Relation Age of Onset  . Heart attack Brother    Allergies  Allergen Reactions  . Loratadine     REACTION: not effective  . Simvastatin     REACTION: intolerant   Current Outpatient Prescriptions on File Prior to Visit  Medication Sig Dispense Refill  . aspirin 81 MG tablet Take 81 mg by mouth daily.      . Blood Glucose Monitoring Suppl (FREESTYLE LITE) DEVI     . Coenzyme Q10 (CO Q 10 PO) Take 1 tablet by mouth 2 (two) times daily.    Marland Kitchen glipiZIDE (GLUCOTROL) 10 MG tablet Take 1 tablet (10 mg total) by mouth 2 (two) times daily with a meal. 180 tablet 3  . glucose blood (FREESTYLE LITE) test strip Check blood sugar twice daily and as needed. 250.0 100 each 11  . glucose monitoring kit (FREESTYLE) monitoring kit 1 each by Does not apply route as needed for other. 1 each 0  . hydrochlorothiazide (HYDRODIURIL) 25 MG tablet TAKE ONE TABLET BY MOUTH EVERY DAY 90 tablet 3  . Lancets (FREESTYLE) lancets Check glucose once daily and as needed.250.0 100 each 11  . LANTUS SOLOSTAR 100 UNIT/ML Solostar Pen INJECT 0.3 MLS (30 UNITS TOTAL) UNDER THE SKIN ONCE DAILY AT BEDTIME 15 mL 2  . lisinopril (PRINIVIL,ZESTRIL) 20 MG tablet TAKE ONE TABLET BY MOUTH EVERY DAY 90 tablet 0  . metFORMIN (GLUCOPHAGE) 1000 MG tablet Take 1 tablet (1,000 mg total) by mouth 2 (two) times daily. 180 tablet 3  . nitroGLYCERIN (NITROSTAT) 0.4 MG SL tablet Place 1 tablet (0.4 mg total) under the tongue every 5 (five) minutes as needed for chest pain. 25 tablet 6  . pravastatin (PRAVACHOL) 80 MG tablet Take 1 tablet (80 mg total) by mouth every evening. 90 tablet 1  . cetirizine (ZYRTEC) 10 MG tablet TAKE ONE TABLET BY MOUTH EVERY DAY (Patient not taking: Reported on 01/12/2015) 90 tablet 1  . triamcinolone (NASACORT) 55 MCG/ACT AERO nasal inhaler USE ONE SPRAYS EACH NOSTRIL EVERY DAY     No current facility-administered medications on file prior  to visit.     Review of Systems Review of Systems  Constitutional: Negative for fever, appetite change, fatigue and unexpected weight change.  Eyes: Negative for pain and visual disturbance.  Respiratory: Negative for cough and shortness of breath.   Cardiovascular:  Negative for cp or palpitations    Gastrointestinal: Negative for nausea, diarrhea and constipation.  Genitourinary: Negative for urgency and frequency.  Skin: Negative for pallor or rash   Neurological: Negative for weakness, light-headedness, numbness and headaches.  Hematological: Negative for adenopathy. Does not bruise/bleed easily.  Psychiatric/Behavioral: Negative for dysphoric mood. The patient is not nervous/anxious.         Objective:   Physical Exam  Constitutional: He appears well-developed and well-nourished. No distress.  obese and well appearing   HENT:  Head: Normocephalic and atraumatic.  Mouth/Throat: Oropharynx is clear and moist.  Eyes: Conjunctivae and EOM are normal. Pupils are equal, round, and reactive to light.  Neck: Normal range of motion. Neck supple. No JVD present. Carotid bruit is not present. No thyromegaly present.  Cardiovascular: Normal rate, regular rhythm, normal heart sounds and intact distal pulses.  Exam reveals no gallop.   Pulmonary/Chest: Effort normal and breath sounds normal. No respiratory distress. He has no wheezes. He has no rales.  No crackles  Abdominal: Soft. Bowel sounds are normal. He exhibits no distension, no abdominal bruit and no mass. There is no tenderness.  Musculoskeletal: He exhibits no edema.  Lymphadenopathy:    He has no cervical adenopathy.  Neurological: He is alert. He has normal reflexes.  Skin: Skin is warm and dry. No rash noted.  Tanned   Psychiatric: He has a normal mood and affect.          Assessment & Plan:   Problem List Items Addressed This Visit    Diabetes type 2, uncontrolled - Primary    Lab Results  Component Value Date    HGBA1C 6.7* 12/30/2014   Improved Commended better habits No change in med  Pt will schedule own eye exam  F/u winter for annual exam       Essential hypertension    bp in fair control at this time  BP Readings from Last 1 Encounters:  01/12/15 130/80   No changes needed Disc lifstyle change with low sodium diet and exercise  Will continue to follow Labs reviewed        HYPERCHOLESTEROLEMIA    Disc goals for lipids and reasons to control them Rev labs with pt Rev low sat fat diet in detail Tolerating pravastatin        Obesity    Discussed how this problem influences overall health and the risks it imposes  Reviewed plan for weight loss with lower calorie diet (via better food choices and also portion control or program like weight watchers) and exercise building up to or more than 30 minutes 5 days per week including some aerobic activity   Enc further work on this

## 2015-01-13 DIAGNOSIS — D0439 Carcinoma in situ of skin of other parts of face: Secondary | ICD-10-CM | POA: Diagnosis not present

## 2015-01-13 DIAGNOSIS — X32XXXA Exposure to sunlight, initial encounter: Secondary | ICD-10-CM | POA: Diagnosis not present

## 2015-01-13 DIAGNOSIS — Z85828 Personal history of other malignant neoplasm of skin: Secondary | ICD-10-CM | POA: Diagnosis not present

## 2015-01-13 DIAGNOSIS — D485 Neoplasm of uncertain behavior of skin: Secondary | ICD-10-CM | POA: Diagnosis not present

## 2015-01-13 DIAGNOSIS — L570 Actinic keratosis: Secondary | ICD-10-CM | POA: Diagnosis not present

## 2015-01-13 NOTE — Assessment & Plan Note (Signed)
Discussed how this problem influences overall health and the risks it imposes  Reviewed plan for weight loss with lower calorie diet (via better food choices and also portion control or program like weight watchers) and exercise building up to or more than 30 minutes 5 days per week including some aerobic activity   Enc further work on this

## 2015-01-13 NOTE — Assessment & Plan Note (Signed)
Disc goals for lipids and reasons to control them Rev labs with pt Rev low sat fat diet in detail Tolerating pravastatin

## 2015-01-13 NOTE — Assessment & Plan Note (Signed)
bp in fair control at this time  BP Readings from Last 1 Encounters:  01/12/15 130/80   No changes needed Disc lifstyle change with low sodium diet and exercise  Will continue to follow Labs reviewed

## 2015-01-13 NOTE — Assessment & Plan Note (Signed)
Lab Results  Component Value Date   HGBA1C 6.7* 12/30/2014   Improved Commended better habits No change in med  Pt will schedule own eye exam  F/u winter for annual exam

## 2015-01-17 ENCOUNTER — Encounter: Payer: Self-pay | Admitting: Cardiology

## 2015-01-17 ENCOUNTER — Ambulatory Visit (INDEPENDENT_AMBULATORY_CARE_PROVIDER_SITE_OTHER): Payer: Commercial Managed Care - HMO | Admitting: Cardiology

## 2015-01-17 ENCOUNTER — Encounter: Payer: Self-pay | Admitting: *Deleted

## 2015-01-17 VITALS — BP 132/54 | HR 68 | Ht 72.0 in | Wt 254.5 lb

## 2015-01-17 DIAGNOSIS — I2510 Atherosclerotic heart disease of native coronary artery without angina pectoris: Secondary | ICD-10-CM | POA: Diagnosis not present

## 2015-01-17 DIAGNOSIS — R9431 Abnormal electrocardiogram [ECG] [EKG]: Secondary | ICD-10-CM | POA: Diagnosis not present

## 2015-01-17 DIAGNOSIS — I2583 Coronary atherosclerosis due to lipid rich plaque: Secondary | ICD-10-CM

## 2015-01-17 DIAGNOSIS — I10 Essential (primary) hypertension: Secondary | ICD-10-CM

## 2015-01-17 NOTE — Assessment & Plan Note (Signed)
Blood pressure controlled. Continue present medications. 

## 2015-01-17 NOTE — Assessment & Plan Note (Signed)
Continue Pravachol. Note high-dose Lipitor caused myalgias.

## 2015-01-17 NOTE — Patient Instructions (Signed)
Your physician wants you to follow-up in: [place] will receive a reminder letter in the mail two months in advance. If you don't receive a letter, please call our office to schedule the follow-up appointment.   Your physician has requested that you have en exercise stress myoview. For further information please visit [email or web address]. Please follow instruction sheet, as given.  DO NOT HOLD ANY MEDICINES

## 2015-01-17 NOTE — Assessment & Plan Note (Signed)
Continue aspirin and statin. Patient is complaining of increased dyspnea on exertion. I will arrange a stress Myoview. If normal or low risk would favor continued medical therapy. If significant ischemia may need to repeat catheterization and consider coronary artery bypass graft.

## 2015-02-03 ENCOUNTER — Telehealth (HOSPITAL_COMMUNITY): Payer: Self-pay

## 2015-02-03 NOTE — Telephone Encounter (Signed)
Encounter complete. 

## 2015-02-08 ENCOUNTER — Ambulatory Visit (HOSPITAL_COMMUNITY)
Admission: RE | Admit: 2015-02-08 | Discharge: 2015-02-08 | Disposition: A | Discharge status: Home / Self Care | Payer: Commercial Managed Care - HMO | Source: Ambulatory Visit | Attending: Urology | Admitting: Urology

## 2015-02-08 ENCOUNTER — Encounter (HOSPITAL_COMMUNITY): Payer: Self-pay | Admitting: *Deleted

## 2015-02-08 DIAGNOSIS — R9439 Abnormal result of other cardiovascular function study: Secondary | ICD-10-CM | POA: Diagnosis not present

## 2015-02-08 DIAGNOSIS — R9431 Abnormal electrocardiogram [ECG] [EKG]: Secondary | ICD-10-CM

## 2015-02-08 LAB — MYOCARDIAL PERFUSION IMAGING
CHL CUP NUCLEAR SRS: 1
CHL CUP NUCLEAR SSS: 1
CSEPEDS: 21 s
CSEPEW: 5 METS
CSEPPHR: 129 {beats}/min
Exercise duration (min): 4 min
MPHR: 142 {beats}/min
NUC STRESS TID: 1.15
Percent HR: 90 %
RPE: 15
Rest HR: 54 {beats}/min
SDS: 0

## 2015-02-08 IMAGING — NM NM MISC PROCEDURE
4 series · 24 of 24 positions shown · non-contrast
Comparison: none

[Series 1: wbr rest · 6.40mm/px · 6 of 64 frames shown]
[frame 6/64]
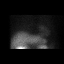
[frame 16/64]
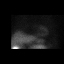
[frame 27/64]
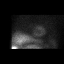
[frame 38/64]
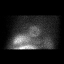
[frame 48/64]
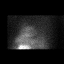
[frame 59/64]
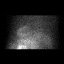

[Series 1: wbr_r-proj_st wbr rest · 6.40mm/px · 6 of 64 frames shown]
[frame 6/64]
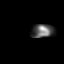
[frame 16/64]
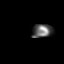
[frame 27/64]
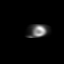
[frame 38/64]
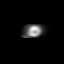
[frame 48/64]
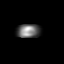
[frame 59/64]
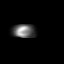

[Series 2: wbr_s-proj_st wbr stress ng · 6.40mm/px · 6 of 64 frames shown]
[frame 6/64]
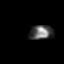
[frame 16/64]
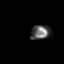
[frame 27/64]
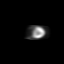
[frame 38/64]
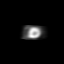
[frame 48/64]
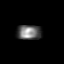
[frame 59/64]
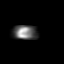

[Series 2: wbr stress ng · 6.40mm/px · 6 of 64 frames shown]
[frame 6/64]
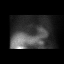
[frame 16/64]
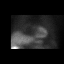
[frame 27/64]
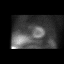
[frame 38/64]
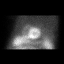
[frame 48/64]
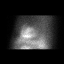
[frame 59/64]
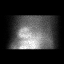

[24 of 24 positions shown; findings below may reference images not displayed]

Canned report from images found in remote index.

Refer to host system for actual result text.

## 2015-02-08 MED ORDER — TECHNETIUM TC 99M SESTAMIBI GENERIC - CARDIOLITE
31.5000 | Freq: Once | INTRAVENOUS | Status: AC | PRN
  Administered 2015-02-08: 32 via INTRAVENOUS

## 2015-02-08 MED ORDER — TECHNETIUM TC 99M SESTAMIBI GENERIC - CARDIOLITE
9.9000 | Freq: Once | INTRAVENOUS | Status: AC | PRN
  Administered 2015-02-08: 10 via INTRAVENOUS

## 2015-02-08 NOTE — Progress Notes (Signed)
Dr Sallyanne Kuster reviewed Cardiolite study. Ok to discharge pt home. Delight Hoh A

## 2015-02-11 DIAGNOSIS — L538 Other specified erythematous conditions: Secondary | ICD-10-CM | POA: Diagnosis not present

## 2015-02-11 DIAGNOSIS — D0439 Carcinoma in situ of skin of other parts of face: Secondary | ICD-10-CM | POA: Diagnosis not present

## 2015-02-11 DIAGNOSIS — R208 Other disturbances of skin sensation: Secondary | ICD-10-CM | POA: Diagnosis not present

## 2015-02-11 DIAGNOSIS — L820 Inflamed seborrheic keratosis: Secondary | ICD-10-CM | POA: Diagnosis not present

## 2015-02-21 NOTE — Progress Notes (Signed)
HPI: FU CAD. Myoview 04/16/11: mild apical ischemia, study not gated. Cath in Nov 2012 revealed normal LM. The LAD demonstrated a long 70-75% diffuse stenosis prior to the bifurcation. It was calcified. The diagonal had a 75% stenosis and there were tandem distal LAD lesions of 70-75%, with the vessel appearing typical for diabetes. The circumflex had a tiny ramus without disease, a moderately large OM1 with a 75% area of proximal disease. The AV vessel, leading to the OM2 had tandem 50% lesions. Both vessels appeared diabetic, but graftable. The RCA has a prox mid lesion of 60-70% . The PDA divides into two branches, and the larger branch has an 80% hypodense area of disease. This vessel is also graftable. EF 60. Dr Lia Foyer felt patient should have either medical therapy or CABG and not PCI. I reviewed films with Dr Percival Spanish and we elected medical therapy. Nuclear study repeated August 2016 secondary to dyspnea. Patient had ST changes and ischemia in the LAD/RCA territories. Since he was last seen he has some dyspnea on exertion but no orthopnea, PND or pedal edema. No chest pain or syncope.  Current Outpatient Prescriptions  Medication Sig Dispense Refill  . aspirin 81 MG tablet Take 81 mg by mouth daily.      . Blood Glucose Monitoring Suppl (FREESTYLE LITE) DEVI     . cetirizine (ZYRTEC) 10 MG tablet TAKE ONE TABLET BY MOUTH EVERY DAY 90 tablet 1  . Coenzyme Q10 (CO Q 10 PO) Take 1 tablet by mouth 2 (two) times daily.    . fluorouracil (EFUDEX) 5 % cream Apply 2 application topically daily.    Marland Kitchen glipiZIDE (GLUCOTROL) 10 MG tablet Take 1 tablet (10 mg total) by mouth 2 (two) times daily with a meal. 180 tablet 3  . glucose blood (FREESTYLE LITE) test strip Check blood sugar twice daily and as needed. 250.0 100 each 11  . glucose monitoring kit (FREESTYLE) monitoring kit 1 each by Does not apply route as needed for other. 1 each 0  . hydrochlorothiazide (HYDRODIURIL) 25 MG tablet TAKE ONE  TABLET BY MOUTH EVERY DAY 90 tablet 3  . Lancets (FREESTYLE) lancets Check glucose once daily and as needed.250.0 100 each 11  . LANTUS SOLOSTAR 100 UNIT/ML Solostar Pen INJECT 0.3 MLS (30 UNITS TOTAL) UNDER THE SKIN ONCE DAILY AT BEDTIME 15 mL 2  . lisinopril (PRINIVIL,ZESTRIL) 20 MG tablet TAKE ONE TABLET BY MOUTH EVERY DAY 90 tablet 0  . metFORMIN (GLUCOPHAGE) 1000 MG tablet Take 1 tablet (1,000 mg total) by mouth 2 (two) times daily. 180 tablet 3  . nitroGLYCERIN (NITROSTAT) 0.4 MG SL tablet Place 1 tablet (0.4 mg total) under the tongue every 5 (five) minutes as needed for chest pain. 25 tablet 6  . pravastatin (PRAVACHOL) 80 MG tablet Take 1 tablet (80 mg total) by mouth every evening. 90 tablet 1  . triamcinolone (NASACORT) 55 MCG/ACT AERO nasal inhaler USE ONE SPRAYS EACH NOSTRIL EVERY DAY     No current facility-administered medications for this visit.     Past Medical History  Diagnosis Date  . Allergy     allergic rhinitis  . Hx of skin cancer, basal cell     many skin cancers removed/under constant treatment.  . Diabetes mellitus     type II x8 years  . GERD (gastroesophageal reflux disease)   . Hypertension   . Arthritis     OA  . BPH (benign prostatic hyperplasia)     microwave  tx of prostate  . AC (acromioclavicular) joint bone spurs     lt ankle  . HLD (hyperlipidemia)     10/12: TC 208, TG 213, HDL 36, LDL 129  . CAD (coronary artery disease)     Myoview 04/16/11: mild apical ischemia, study not gated.    . Scoliosis   . Chronic upper back pain   . Obstructive sleep apnea on CPAP     Past Surgical History  Procedure Laterality Date  . Breast surgery  2009    breast lump removed benign    Social History   Social History  . Marital Status: Married    Spouse Name: N/A  . Number of Children: N/A  . Years of Education: N/A   Occupational History  . Not on file.   Social History Main Topics  . Smoking status: Never Smoker   . Smokeless tobacco:  Never Used  . Alcohol Use: No  . Drug Use: No  . Sexual Activity: Not on file   Other Topics Concern  . Not on file   Social History Narrative   Married    Physically active hard of hearing    ROS: no fevers or chills, productive cough, hemoptysis, dysphasia, odynophagia, melena, hematochezia, dysuria, hematuria, rash, seizure activity, orthopnea, PND, pedal edema, claudication. Remaining systems are negative.  Physical Exam: Well-developed obese in no acute distress.  Skin is warm and dry.  HEENT is normal.  Neck is supple.  Chest is clear to auscultation with normal expansion.  Cardiovascular exam is regular rate and rhythm.  Abdominal exam nontender or distended. No masses palpated. Extremities show no edema. neuro grossly intact

## 2015-02-25 ENCOUNTER — Other Ambulatory Visit: Payer: Self-pay | Admitting: Cardiology

## 2015-02-25 ENCOUNTER — Ambulatory Visit (INDEPENDENT_AMBULATORY_CARE_PROVIDER_SITE_OTHER): Payer: Commercial Managed Care - HMO | Admitting: Cardiology

## 2015-02-25 ENCOUNTER — Encounter: Payer: Self-pay | Admitting: Cardiology

## 2015-02-25 ENCOUNTER — Encounter: Payer: Self-pay | Admitting: *Deleted

## 2015-02-25 VITALS — BP 120/60 | HR 70 | Ht 72.0 in | Wt 251.2 lb

## 2015-02-25 DIAGNOSIS — R0600 Dyspnea, unspecified: Secondary | ICD-10-CM

## 2015-02-25 DIAGNOSIS — E7800 Pure hypercholesterolemia, unspecified: Secondary | ICD-10-CM

## 2015-02-25 DIAGNOSIS — I10 Essential (primary) hypertension: Secondary | ICD-10-CM | POA: Diagnosis not present

## 2015-02-25 DIAGNOSIS — R0602 Shortness of breath: Secondary | ICD-10-CM

## 2015-02-25 DIAGNOSIS — I2583 Coronary atherosclerosis due to lipid rich plaque: Secondary | ICD-10-CM

## 2015-02-25 DIAGNOSIS — I2510 Atherosclerotic heart disease of native coronary artery without angina pectoris: Secondary | ICD-10-CM

## 2015-02-25 MED ORDER — HYDROCHLOROTHIAZIDE 12.5 MG PO TABS: 12.5000 mg | ORAL_TABLET | Freq: Every day | ORAL | Status: DC

## 2015-02-25 MED ORDER — LISINOPRIL 10 MG PO TABS: ORAL_TABLET | ORAL | Status: DC

## 2015-02-25 NOTE — Assessment & Plan Note (Signed)
Continue aspirin and statin. 

## 2015-02-25 NOTE — Assessment & Plan Note (Signed)
Patient is having some orthostatic symptoms. Decrease lisinopril to 10 mg daily and HCTZ to 12.5 mg daily and follow.

## 2015-02-25 NOTE — Patient Instructions (Signed)
Your physician has requested that you have a cardiac catheterization. Cardiac catheterization is used to diagnose and/or treat various heart conditions. Doctors may recommend this procedure for a number of different reasons. The most common reason is to evaluate chest pain. Chest pain can be a symptom of coronary artery disease (CAD), and cardiac catheterization can show whether plaque is narrowing or blocking your heart's arteries. This procedure is also used to evaluate the valves, as well as measure the blood flow and oxygen levels in different parts of your heart. For further information please visit [email or web address]. Please follow instruction sheet, as given.   Your physician recommends that you return for lab work in:BY 03-11-15  DECREASE LISINOPRIL TO 10 MG ONCE DAILY= 1/2 OF THE 20 MG TABLETS ONCE DAILY  DECREASE HCTZ TO 12.5 MG ONCE DAILY=1/2 OF THE 25 MG TABLETS ONCE DAILY

## 2015-02-25 NOTE — Assessment & Plan Note (Signed)
Continue Pravachol. Patient did not tolerate Lipitor previously.

## 2015-02-25 NOTE — Assessment & Plan Note (Signed)
Patient has some dyspnea on exertion which is worse. His nuclear study shows anterior and inferior ischemia. I discussed the options at length today. We will proceed with cardiac catheterization to rule out progression of his coronary disease. Question need for PCI or coronary artery bypass graft. The risks and benefits of catheterization were discussed and the patient agrees to proceed. Hold HCTZ 2 days prior to the procedure and the day of the procedure. Hold Glucophage for 48 hours afterwards.

## 2015-03-14 ENCOUNTER — Other Ambulatory Visit (INDEPENDENT_AMBULATORY_CARE_PROVIDER_SITE_OTHER): Payer: Commercial Managed Care - HMO

## 2015-03-14 DIAGNOSIS — I2510 Atherosclerotic heart disease of native coronary artery without angina pectoris: Secondary | ICD-10-CM

## 2015-03-14 DIAGNOSIS — I2583 Coronary atherosclerosis due to lipid rich plaque: Principal | ICD-10-CM

## 2015-03-14 DIAGNOSIS — R0602 Shortness of breath: Secondary | ICD-10-CM

## 2015-03-14 LAB — CBC WITH DIFFERENTIAL/PLATELET
Basophils Absolute: 0 10*3/uL (ref 0.0–0.1)
Basophils Relative: 0.4 % (ref 0.0–3.0)
EOS ABS: 0.3 10*3/uL (ref 0.0–0.7)
EOS PCT: 4.3 % (ref 0.0–5.0)
HCT: 40.8 % (ref 39.0–52.0)
Hemoglobin: 13.9 g/dL (ref 13.0–17.0)
LYMPHS ABS: 1.7 10*3/uL (ref 0.7–4.0)
Lymphocytes Relative: 22.1 % (ref 12.0–46.0)
MCHC: 34 g/dL (ref 30.0–36.0)
MCV: 93.7 fl (ref 78.0–100.0)
MONO ABS: 0.8 10*3/uL (ref 0.1–1.0)
Monocytes Relative: 10.3 % (ref 3.0–12.0)
NEUTROS PCT: 62.9 % (ref 43.0–77.0)
Neutro Abs: 4.7 10*3/uL (ref 1.4–7.7)
Platelets: 179 10*3/uL (ref 150.0–400.0)
RBC: 4.36 Mil/uL (ref 4.22–5.81)
RDW: 15.6 % — ABNORMAL HIGH (ref 11.5–15.5)
WBC: 7.5 10*3/uL (ref 4.0–10.5)

## 2015-03-14 LAB — BASIC METABOLIC PANEL
BUN: 17 mg/dL (ref 6–23)
CALCIUM: 9.3 mg/dL (ref 8.4–10.5)
CO2: 28 meq/L (ref 19–32)
Chloride: 105 mEq/L (ref 96–112)
Creatinine, Ser: 1.09 mg/dL (ref 0.40–1.50)
GFR: 69.35 mL/min (ref >60.00)
Glucose, Bld: 153 mg/dL — ABNORMAL HIGH (ref 70–99)
POTASSIUM: 4.5 meq/L (ref 3.5–5.1)
SODIUM: 141 meq/L (ref 135–145)

## 2015-03-14 LAB — PROTIME-INR
INR: 1.2 ratio — ABNORMAL HIGH (ref 0.8–1.0)
PROTHROMBIN TIME: 13.4 s — AB (ref 9.6–13.1)

## 2015-03-14 NOTE — Addendum Note (Signed)
Addended by: Marchia Bond on: 03/14/2015 08:56 AM   Modules accepted: Orders

## 2015-03-15 ENCOUNTER — Ambulatory Visit (HOSPITAL_COMMUNITY)
Admission: RE | Admit: 2015-03-15 | Discharge: 2015-03-15 | Disposition: A | Discharge status: Home / Self Care | Payer: Commercial Managed Care - HMO | Source: Ambulatory Visit | Attending: Interventional Cardiology | Admitting: Interventional Cardiology

## 2015-03-15 ENCOUNTER — Encounter (HOSPITAL_COMMUNITY)
Admission: RE | Disposition: A | Discharge status: Home / Self Care | Payer: Commercial Managed Care - HMO | Source: Ambulatory Visit | Attending: Interventional Cardiology

## 2015-03-15 DIAGNOSIS — R931 Abnormal findings on diagnostic imaging of heart and coronary circulation: Secondary | ICD-10-CM | POA: Insufficient documentation

## 2015-03-15 DIAGNOSIS — G4733 Obstructive sleep apnea (adult) (pediatric): Secondary | ICD-10-CM | POA: Insufficient documentation

## 2015-03-15 DIAGNOSIS — IMO0001 Reserved for inherently not codable concepts without codable children: Secondary | ICD-10-CM | POA: Diagnosis present

## 2015-03-15 DIAGNOSIS — I10 Essential (primary) hypertension: Secondary | ICD-10-CM | POA: Insufficient documentation

## 2015-03-15 DIAGNOSIS — M419 Scoliosis, unspecified: Secondary | ICD-10-CM | POA: Insufficient documentation

## 2015-03-15 DIAGNOSIS — R9430 Abnormal result of cardiovascular function study, unspecified: Secondary | ICD-10-CM | POA: Diagnosis present

## 2015-03-15 DIAGNOSIS — Z9861 Coronary angioplasty status: Secondary | ICD-10-CM

## 2015-03-15 DIAGNOSIS — Z794 Long term (current) use of insulin: Secondary | ICD-10-CM | POA: Insufficient documentation

## 2015-03-15 DIAGNOSIS — E785 Hyperlipidemia, unspecified: Secondary | ICD-10-CM | POA: Diagnosis not present

## 2015-03-15 DIAGNOSIS — Z85828 Personal history of other malignant neoplasm of skin: Secondary | ICD-10-CM | POA: Insufficient documentation

## 2015-03-15 DIAGNOSIS — R0600 Dyspnea, unspecified: Secondary | ICD-10-CM | POA: Insufficient documentation

## 2015-03-15 DIAGNOSIS — I2510 Atherosclerotic heart disease of native coronary artery without angina pectoris: Secondary | ICD-10-CM

## 2015-03-15 DIAGNOSIS — Z7982 Long term (current) use of aspirin: Secondary | ICD-10-CM | POA: Insufficient documentation

## 2015-03-15 DIAGNOSIS — E119 Type 2 diabetes mellitus without complications: Secondary | ICD-10-CM | POA: Insufficient documentation

## 2015-03-15 DIAGNOSIS — R9431 Abnormal electrocardiogram [ECG] [EKG]: Secondary | ICD-10-CM | POA: Diagnosis present

## 2015-03-15 HISTORY — PX: CARDIAC CATHETERIZATION: SHX172

## 2015-03-15 LAB — GLUCOSE, CAPILLARY: Glucose-Capillary: 147 mg/dL — ABNORMAL HIGH (ref 65–99)

## 2015-03-15 SURGERY — LEFT HEART CATH AND CORONARY ANGIOGRAPHY
Anesthesia: LOCAL

## 2015-03-15 MED ORDER — FENTANYL CITRATE (PF) 100 MCG/2ML IJ SOLN
INTRAMUSCULAR | Status: AC
  Filled 2015-03-15: qty 4

## 2015-03-15 MED ORDER — SODIUM CHLORIDE 0.9 % IV SOLN: 250.0000 mL | INTRAVENOUS | Status: DC | PRN

## 2015-03-15 MED ORDER — LIDOCAINE HCL (PF) 1 % IJ SOLN: INTRAMUSCULAR | Status: DC | PRN

## 2015-03-15 MED ORDER — VERAPAMIL HCL 2.5 MG/ML IV SOLN
INTRAVENOUS | Status: DC | PRN
  Administered 2015-03-15: 10:00:00 via INTRA_ARTERIAL

## 2015-03-15 MED ORDER — SODIUM CHLORIDE 0.9 % WEIGHT BASED INFUSION
3.0000 mL/kg/h | INTRAVENOUS | Status: DC
  Administered 2015-03-15: 3 mL/kg/h via INTRAVENOUS

## 2015-03-15 MED ORDER — ACETAMINOPHEN 325 MG PO TABS: 650.0000 mg | ORAL_TABLET | ORAL | Status: DC | PRN

## 2015-03-15 MED ORDER — HEPARIN SODIUM (PORCINE) 1000 UNIT/ML IJ SOLN
INTRAMUSCULAR | Status: DC | PRN
  Administered 2015-03-15: 5000 [IU] via INTRAVENOUS

## 2015-03-15 MED ORDER — ONDANSETRON HCL 4 MG/2ML IJ SOLN: 4.0000 mg | Freq: Four times a day (QID) | INTRAMUSCULAR | Status: DC | PRN

## 2015-03-15 MED ORDER — SODIUM CHLORIDE 0.9 % WEIGHT BASED INFUSION: 1.0000 mL/kg/h | INTRAVENOUS | Status: AC

## 2015-03-15 MED ORDER — MIDAZOLAM HCL 2 MG/2ML IJ SOLN
INTRAMUSCULAR | Status: AC
  Filled 2015-03-15: qty 4

## 2015-03-15 MED ORDER — SODIUM CHLORIDE 0.9 % IV SOLN
INTRAVENOUS | Status: DC | PRN
  Administered 2015-03-15: 113 mL/h via INTRAVENOUS

## 2015-03-15 MED ORDER — IOHEXOL 350 MG/ML SOLN
INTRAVENOUS | Status: DC | PRN
  Administered 2015-03-15: 95 mL via INTRA_ARTERIAL

## 2015-03-15 MED ORDER — LIDOCAINE HCL (PF) 1 % IJ SOLN
INTRAMUSCULAR | Status: AC
  Filled 2015-03-15: qty 30

## 2015-03-15 MED ORDER — NITROGLYCERIN 1 MG/10 ML FOR IR/CATH LAB
INTRA_ARTERIAL | Status: AC
  Filled 2015-03-15: qty 10

## 2015-03-15 MED ORDER — VERAPAMIL HCL 2.5 MG/ML IV SOLN
INTRAVENOUS | Status: AC
  Filled 2015-03-15: qty 2

## 2015-03-15 MED ORDER — HEPARIN (PORCINE) IN NACL 2-0.9 UNIT/ML-% IJ SOLN
INTRAMUSCULAR | Status: AC
  Filled 2015-03-15: qty 1000

## 2015-03-15 MED ORDER — SODIUM CHLORIDE 0.9 % IJ SOLN: 3.0000 mL | Freq: Two times a day (BID) | INTRAMUSCULAR | Status: DC

## 2015-03-15 MED ORDER — FENTANYL CITRATE (PF) 100 MCG/2ML IJ SOLN
INTRAMUSCULAR | Status: DC | PRN
  Administered 2015-03-15: 50 ug via INTRAVENOUS

## 2015-03-15 MED ORDER — OXYCODONE-ACETAMINOPHEN 5-325 MG PO TABS: 1.0000 | ORAL_TABLET | ORAL | Status: DC | PRN

## 2015-03-15 MED ORDER — SODIUM CHLORIDE 0.9 % IJ SOLN: 3.0000 mL | INTRAMUSCULAR | Status: DC | PRN

## 2015-03-15 MED ORDER — LIDOCAINE HCL (PF) 1 % IJ SOLN
INTRAMUSCULAR | Status: DC | PRN
  Administered 2015-03-15: 11:00:00

## 2015-03-15 MED ORDER — HEPARIN SODIUM (PORCINE) 1000 UNIT/ML IJ SOLN
INTRAMUSCULAR | Status: AC
  Filled 2015-03-15: qty 1

## 2015-03-15 MED ORDER — SODIUM CHLORIDE 0.9 % WEIGHT BASED INFUSION: 1.0000 mL/kg/h | INTRAVENOUS | Status: DC

## 2015-03-15 MED ORDER — ASPIRIN 81 MG PO CHEW: 81.0000 mg | CHEWABLE_TABLET | ORAL | Status: DC

## 2015-03-15 MED ORDER — MIDAZOLAM HCL 2 MG/2ML IJ SOLN
INTRAMUSCULAR | Status: DC | PRN
  Administered 2015-03-15: 1 mg via INTRAVENOUS

## 2015-03-15 MED ORDER — ASPIRIN 81 MG PO CHEW: 81.0000 mg | CHEWABLE_TABLET | Freq: Every day | ORAL | Status: DC

## 2015-03-15 SURGICAL SUPPLY — 9 items
CATH INFINITI 5 FR JL3.5 (CATHETERS) ×2 IMPLANT
CATH INFINITI JR4 5F (CATHETERS) ×2 IMPLANT
DEVICE RAD COMP TR BAND LRG (VASCULAR PRODUCTS) ×2 IMPLANT
GLIDESHEATH SLEND A-KIT 6F 22G (SHEATH) ×2 IMPLANT
KIT HEART LEFT (KITS) ×2 IMPLANT
PACK CARDIAC CATHETERIZATION (CUSTOM PROCEDURE TRAY) ×2 IMPLANT
TRANSDUCER W/STOPCOCK (MISCELLANEOUS) ×2 IMPLANT
TUBING CIL FLEX 10 FLL-RA (TUBING) ×2 IMPLANT
WIRE SAFE-T 1.5MM-J .035X260CM (WIRE) ×2 IMPLANT

## 2015-03-15 NOTE — Interval H&P Note (Signed)
History and Physical Interval Note:  03/15/2015 Cath Lab Visit (complete for each Cath Lab visit)  Clinical Evaluation Leading to the Procedure:   ACS: No.  Non-ACS:    Anginal Classification: CCS III  Anti-ischemic medical therapy: Maximal Therapy (2 or more classes of medications)  Non-Invasive Test Results: High-risk stress test findings: cardiac mortality >3%/year  Prior CABG: No previous CABG       7:09 AM  Hinton Rao  has presented today for surgery, with the diagnosis of abnormal stress test  The various methods of treatment have been discussed with the patient and family. After consideration of risks, benefits and other options for treatment, the patient has consented to  Procedure(s): Left Heart Cath and Coronary Angiography (N/A) as a surgical intervention .  The patient's history has been reviewed, patient examined, no change in status, stable for surgery.  I have reviewed the patient's chart and labs.  Questions were answered to the patient's satisfaction.     Sinclair Grooms

## 2015-03-15 NOTE — Discharge Instructions (Signed)
Radial Site Care °Refer to this sheet in the next few weeks. These instructions provide you with information on caring for yourself after your procedure. Your caregiver may also give you more specific instructions. Your treatment has been planned according to current medical practices, but problems sometimes occur. Call your caregiver if you have any problems or questions after your procedure. °HOME CARE INSTRUCTIONS °· You may shower the day after the procedure. Remove the bandage (dressing) and gently wash the site with plain soap and water. Gently pat the site dry. °· Do not apply powder or lotion to the site. °· Do not submerge the affected site in water for 3 to 5 days. °· Inspect the site at least twice daily. °· Do not flex or bend the affected arm for 24 hours. °· No lifting over 5 pounds (2.3 kg) for 5 days after your procedure. °· Do not drive home if you are discharged the same day of the procedure. Have someone else drive you. °· You may drive 24 hours after the procedure unless otherwise instructed by your caregiver. °· Do not operate machinery or power tools for 24 hours. °· A responsible adult should be with you for the first 24 hours after you arrive home. °What to expect: °· Any bruising will usually fade within 1 to 2 weeks. °· Blood that collects in the tissue (hematoma) may be painful to the touch. It should usually decrease in size and tenderness within 1 to 2 weeks. °SEEK IMMEDIATE MEDICAL CARE IF: °· You have unusual pain at the radial site. °· You have redness, warmth, swelling, or pain at the radial site. °· You have drainage (other than a small amount of blood on the dressing). °· You have chills. °· You have a fever or persistent symptoms for more than 72 hours. °· You have a fever and your symptoms suddenly get worse. °· Your arm becomes pale, cool, tingly, or numb. °· You have heavy bleeding from the site. Hold pressure on the site. °Document Released: 07/21/2010 Document Revised:  09/10/2011 Document Reviewed: 07/21/2010 °ExitCare® Patient Information ©2015 [employer]. This information is not intended to replace advice given to you by your health care provider. Make sure you discuss any questions you have with your health care provider. ° °

## 2015-03-15 NOTE — H&P (View-Only) (Signed)
    HPI: FU CAD. Myoview 04/16/11: mild apical ischemia, study not gated. Cath in Nov 2012 revealed normal LM. The LAD demonstrated a long 70-75% diffuse stenosis prior to the bifurcation. It was calcified. The diagonal had a 75% stenosis and there were tandem distal LAD lesions of 70-75%, with the vessel appearing typical for diabetes. The circumflex had a tiny ramus without disease, a moderately large OM1 with a 75% area of proximal disease. The AV vessel, leading to the OM2 had tandem 50% lesions. Both vessels appeared diabetic, but graftable. The RCA has a prox mid lesion of 60-70% . The PDA divides into two branches, and the larger branch has an 80% hypodense area of disease. This vessel is also graftable. EF 60. Dr Stuckey felt patient should have either medical therapy or CABG and not PCI. I reviewed films with Dr Hochrein and we elected medical therapy. Nuclear study repeated August 2016 secondary to dyspnea. Patient had ST changes and ischemia in the LAD/RCA territories. Since he was last seen he has some dyspnea on exertion but no orthopnea, PND or pedal edema. No chest pain or syncope.  Current Outpatient Prescriptions  Medication Sig Dispense Refill  . aspirin 81 MG tablet Take 81 mg by mouth daily.      . Blood Glucose Monitoring Suppl (FREESTYLE LITE) DEVI     . cetirizine (ZYRTEC) 10 MG tablet TAKE ONE TABLET BY MOUTH EVERY DAY 90 tablet 1  . Coenzyme Q10 (CO Q 10 PO) Take 1 tablet by mouth 2 (two) times daily.    . fluorouracil (EFUDEX) 5 % cream Apply 2 application topically daily.    . glipiZIDE (GLUCOTROL) 10 MG tablet Take 1 tablet (10 mg total) by mouth 2 (two) times daily with a meal. 180 tablet 3  . glucose blood (FREESTYLE LITE) test strip Check blood sugar twice daily and as needed. 250.0 100 each 11  . glucose monitoring kit (FREESTYLE) monitoring kit 1 each by Does not apply route as needed for other. 1 each 0  . hydrochlorothiazide (HYDRODIURIL) 25 MG tablet TAKE ONE  TABLET BY MOUTH EVERY DAY 90 tablet 3  . Lancets (FREESTYLE) lancets Check glucose once daily and as needed.250.0 100 each 11  . LANTUS SOLOSTAR 100 UNIT/ML Solostar Pen INJECT 0.3 MLS (30 UNITS TOTAL) UNDER THE SKIN ONCE DAILY AT BEDTIME 15 mL 2  . lisinopril (PRINIVIL,ZESTRIL) 20 MG tablet TAKE ONE TABLET BY MOUTH EVERY DAY 90 tablet 0  . metFORMIN (GLUCOPHAGE) 1000 MG tablet Take 1 tablet (1,000 mg total) by mouth 2 (two) times daily. 180 tablet 3  . nitroGLYCERIN (NITROSTAT) 0.4 MG SL tablet Place 1 tablet (0.4 mg total) under the tongue every 5 (five) minutes as needed for chest pain. 25 tablet 6  . pravastatin (PRAVACHOL) 80 MG tablet Take 1 tablet (80 mg total) by mouth every evening. 90 tablet 1  . triamcinolone (NASACORT) 55 MCG/ACT AERO nasal inhaler USE ONE SPRAYS EACH NOSTRIL EVERY DAY     No current facility-administered medications for this visit.     Past Medical History  Diagnosis Date  . Allergy     allergic rhinitis  . Hx of skin cancer, basal cell     many skin cancers removed/under constant treatment.  . Diabetes mellitus     type II x8 years  . GERD (gastroesophageal reflux disease)   . Hypertension   . Arthritis     OA  . BPH (benign prostatic hyperplasia)     microwave   tx of prostate  . AC (acromioclavicular) joint bone spurs     lt ankle  . HLD (hyperlipidemia)     10/12: TC 208, TG 213, HDL 36, LDL 129  . CAD (coronary artery disease)     Myoview 04/16/11: mild apical ischemia, study not gated.    . Scoliosis   . Chronic upper back pain   . Obstructive sleep apnea on CPAP     Past Surgical History  Procedure Laterality Date  . Breast surgery  2009    breast lump removed benign    Social History   Social History  . Marital Status: Married    Spouse Name: N/A  . Number of Children: N/A  . Years of Education: N/A   Occupational History  . Not on file.   Social History Main Topics  . Smoking status: Never Smoker   . Smokeless tobacco:  Never Used  . Alcohol Use: No  . Drug Use: No  . Sexual Activity: Not on file   Other Topics Concern  . Not on file   Social History Narrative   Married    Physically active hard of hearing    ROS: no fevers or chills, productive cough, hemoptysis, dysphasia, odynophagia, melena, hematochezia, dysuria, hematuria, rash, seizure activity, orthopnea, PND, pedal edema, claudication. Remaining systems are negative.  Physical Exam: Well-developed obese in no acute distress.  Skin is warm and dry.  HEENT is normal.  Neck is supple.  Chest is clear to auscultation with normal expansion.  Cardiovascular exam is regular rate and rhythm.  Abdominal exam nontender or distended. No masses palpated. Extremities show no edema. neuro grossly intact

## 2015-03-16 ENCOUNTER — Telehealth: Payer: Self-pay

## 2015-03-16 ENCOUNTER — Encounter (HOSPITAL_COMMUNITY): Payer: Self-pay | Admitting: Interventional Cardiology

## 2015-03-16 DIAGNOSIS — I2510 Atherosclerotic heart disease of native coronary artery without angina pectoris: Secondary | ICD-10-CM | POA: Diagnosis not present

## 2015-03-16 DIAGNOSIS — Z794 Long term (current) use of insulin: Secondary | ICD-10-CM | POA: Diagnosis not present

## 2015-03-16 DIAGNOSIS — Z85828 Personal history of other malignant neoplasm of skin: Secondary | ICD-10-CM | POA: Diagnosis not present

## 2015-03-16 DIAGNOSIS — M419 Scoliosis, unspecified: Secondary | ICD-10-CM | POA: Diagnosis not present

## 2015-03-16 DIAGNOSIS — E785 Hyperlipidemia, unspecified: Secondary | ICD-10-CM | POA: Diagnosis not present

## 2015-03-16 DIAGNOSIS — G4733 Obstructive sleep apnea (adult) (pediatric): Secondary | ICD-10-CM | POA: Diagnosis not present

## 2015-03-16 DIAGNOSIS — Z7982 Long term (current) use of aspirin: Secondary | ICD-10-CM | POA: Diagnosis not present

## 2015-03-16 DIAGNOSIS — E119 Type 2 diabetes mellitus without complications: Secondary | ICD-10-CM | POA: Diagnosis not present

## 2015-03-16 DIAGNOSIS — I10 Essential (primary) hypertension: Secondary | ICD-10-CM | POA: Diagnosis not present

## 2015-03-16 MED FILL — Nitroglycerin IV Soln 100 MCG/ML in D5W: INTRA_ARTERIAL | Qty: 10 | Status: AC

## 2015-03-16 NOTE — Telephone Encounter (Addendum)
Ryan Garcia with [facility] mail order request new rx for diabetic testing supplies. Ryan Garcia will fax request. Spoke with Mrs Force and she said due to ins coverage pt needs to get diabetic testing supplies from mail order pharmacy. Will await fax. Fax requesting true metrix air meter, true metrix test strips and trueplus ultra thin 30 G lancets was done electronically. [employer] mail order pharmacy.

## 2015-03-17 MED ORDER — TRUE METRIX AIR GLUCOSE METER W/DEVICE KIT: 1.0000 | PACK | Freq: Two times a day (BID) | Status: DC

## 2015-03-17 MED ORDER — GLUCOSE BLOOD VI STRP: ORAL_STRIP | Status: DC

## 2015-03-17 MED ORDER — TRUEPLUS LANCETS 30G MISC: Status: DC

## 2015-03-23 ENCOUNTER — Telehealth: Payer: Self-pay | Admitting: Cardiology

## 2015-03-23 NOTE — Telephone Encounter (Signed)
°  1. Which medications need to be refilled? Pravastatin-he needs today as soon as possible,he is going out of town  2. Which pharmacy is medication to be sent to?[phone]  3. Do they need a 30 day or 90 day supply? 90 and refills  4. Would they like a call back once the medication has been sent to the pharmacy? yes

## 2015-03-24 ENCOUNTER — Other Ambulatory Visit: Payer: Self-pay | Admitting: *Deleted

## 2015-03-24 MED ORDER — PRAVASTATIN SODIUM 80 MG PO TABS: 80.0000 mg | ORAL_TABLET | Freq: Every evening | ORAL | Status: DC

## 2015-04-04 ENCOUNTER — Other Ambulatory Visit: Payer: Self-pay

## 2015-04-04 MED ORDER — INSULIN GLARGINE 100 UNIT/ML SOLOSTAR PEN: PEN_INJECTOR | SUBCUTANEOUS | Status: DC

## 2015-04-04 NOTE — Telephone Encounter (Signed)
Mrs [facility] signed request lantus to [facility] since [facility] closing; advised done per protocol.

## 2015-04-18 ENCOUNTER — Encounter: Payer: Self-pay | Admitting: Gastroenterology

## 2015-05-17 DIAGNOSIS — Z08 Encounter for follow-up examination after completed treatment for malignant neoplasm: Secondary | ICD-10-CM | POA: Diagnosis not present

## 2015-05-17 DIAGNOSIS — C44612 Basal cell carcinoma of skin of right upper limb, including shoulder: Secondary | ICD-10-CM | POA: Diagnosis not present

## 2015-05-17 DIAGNOSIS — C4442 Squamous cell carcinoma of skin of scalp and neck: Secondary | ICD-10-CM | POA: Diagnosis not present

## 2015-05-17 DIAGNOSIS — Z872 Personal history of diseases of the skin and subcutaneous tissue: Secondary | ICD-10-CM | POA: Diagnosis not present

## 2015-05-17 DIAGNOSIS — D485 Neoplasm of uncertain behavior of skin: Secondary | ICD-10-CM | POA: Diagnosis not present

## 2015-05-17 DIAGNOSIS — X32XXXA Exposure to sunlight, initial encounter: Secondary | ICD-10-CM | POA: Diagnosis not present

## 2015-05-17 DIAGNOSIS — L570 Actinic keratosis: Secondary | ICD-10-CM | POA: Diagnosis not present

## 2015-05-17 DIAGNOSIS — Z85828 Personal history of other malignant neoplasm of skin: Secondary | ICD-10-CM | POA: Diagnosis not present

## 2015-05-20 ENCOUNTER — Other Ambulatory Visit: Payer: Self-pay

## 2015-05-20 NOTE — Telephone Encounter (Signed)
Ryan Garcia request refill metformin and needles to give insulin shot with to [facility]. Pt seen 01/12/15 and CPX scheduled 08/26/15. Left v/m requesting cb; what size needle pt using.

## 2015-05-23 MED ORDER — PEN NEEDLES 31G X 6 MM MISC: Status: DC

## 2015-05-23 MED ORDER — METFORMIN HCL 1000 MG PO TABS: 1000.0000 mg | ORAL_TABLET | Freq: Two times a day (BID) | ORAL | Status: DC

## 2015-05-23 NOTE — Telephone Encounter (Signed)
Ryan Garcia called back and med refilled as requested.

## 2015-06-07 ENCOUNTER — Other Ambulatory Visit: Payer: Self-pay | Admitting: Family Medicine

## 2015-06-09 DIAGNOSIS — E119 Type 2 diabetes mellitus without complications: Secondary | ICD-10-CM | POA: Diagnosis not present

## 2015-06-09 DIAGNOSIS — H35372 Puckering of macula, left eye: Secondary | ICD-10-CM | POA: Diagnosis not present

## 2015-06-10 LAB — HM DIABETES EYE EXAM

## 2015-06-13 ENCOUNTER — Encounter: Payer: Self-pay | Admitting: Family Medicine

## 2015-06-21 ENCOUNTER — Other Ambulatory Visit: Payer: Self-pay | Admitting: *Deleted

## 2015-06-21 MED ORDER — GLIPIZIDE 10 MG PO TABS: 10.0000 mg | ORAL_TABLET | Freq: Two times a day (BID) | ORAL | Status: DC

## 2015-06-23 ENCOUNTER — Other Ambulatory Visit: Payer: Self-pay | Admitting: Cardiology

## 2015-06-23 MED ORDER — PRAVASTATIN SODIUM 80 MG PO TABS: 80.0000 mg | ORAL_TABLET | Freq: Every evening | ORAL | Status: DC

## 2015-06-29 DIAGNOSIS — C44612 Basal cell carcinoma of skin of right upper limb, including shoulder: Secondary | ICD-10-CM | POA: Diagnosis not present

## 2015-07-08 ENCOUNTER — Other Ambulatory Visit: Payer: Self-pay

## 2015-07-08 MED ORDER — GLIPIZIDE 10 MG PO TABS: 10.0000 mg | ORAL_TABLET | Freq: Two times a day (BID) | ORAL | Status: DC

## 2015-07-08 NOTE — Telephone Encounter (Signed)
Ryan Garcia left v/m that Ryan Garcia needs new rx for glipizide; spoke with Ryan Garcia and Ryan Garcia did not get glipizide.rx resent.

## 2015-08-12 ENCOUNTER — Encounter: Payer: Self-pay | Admitting: Gastroenterology

## 2015-08-18 ENCOUNTER — Telehealth: Payer: Self-pay | Admitting: Family Medicine

## 2015-08-18 DIAGNOSIS — Z125 Encounter for screening for malignant neoplasm of prostate: Secondary | ICD-10-CM

## 2015-08-18 DIAGNOSIS — IMO0001 Reserved for inherently not codable concepts without codable children: Secondary | ICD-10-CM

## 2015-08-18 DIAGNOSIS — E1165 Type 2 diabetes mellitus with hyperglycemia: Secondary | ICD-10-CM

## 2015-08-18 DIAGNOSIS — E7800 Pure hypercholesterolemia, unspecified: Secondary | ICD-10-CM

## 2015-08-18 DIAGNOSIS — I10 Essential (primary) hypertension: Secondary | ICD-10-CM

## 2015-08-18 NOTE — Telephone Encounter (Signed)
-----   Message from Ellamae Sia sent at 08/17/2015  6:07 PM EST ----- Regarding: Lab orders for Friday, 12.17.17 Patient is scheduled for CPX labs, please order future labs, Thanks , Karna Christmas

## 2015-08-19 ENCOUNTER — Other Ambulatory Visit (INDEPENDENT_AMBULATORY_CARE_PROVIDER_SITE_OTHER): Payer: Commercial Managed Care - HMO

## 2015-08-19 DIAGNOSIS — I10 Essential (primary) hypertension: Secondary | ICD-10-CM | POA: Diagnosis not present

## 2015-08-19 DIAGNOSIS — E7800 Pure hypercholesterolemia, unspecified: Secondary | ICD-10-CM

## 2015-08-19 DIAGNOSIS — Z125 Encounter for screening for malignant neoplasm of prostate: Secondary | ICD-10-CM | POA: Diagnosis not present

## 2015-08-19 DIAGNOSIS — IMO0001 Reserved for inherently not codable concepts without codable children: Secondary | ICD-10-CM

## 2015-08-19 DIAGNOSIS — E1165 Type 2 diabetes mellitus with hyperglycemia: Secondary | ICD-10-CM

## 2015-08-19 LAB — CBC WITH DIFFERENTIAL/PLATELET
BASOS ABS: 0 10*3/uL (ref 0.0–0.1)
BASOS PCT: 0.2 % (ref 0.0–3.0)
EOS ABS: 0.3 10*3/uL (ref 0.0–0.7)
Eosinophils Relative: 3.6 % (ref 0.0–5.0)
HEMATOCRIT: 41.5 % (ref 39.0–52.0)
Hemoglobin: 13.9 g/dL (ref 13.0–17.0)
LYMPHS ABS: 1.6 10*3/uL (ref 0.7–4.0)
LYMPHS PCT: 19.8 % (ref 12.0–46.0)
MCHC: 33.5 g/dL (ref 30.0–36.0)
MCV: 92.5 fl (ref 78.0–100.0)
Monocytes Absolute: 0.7 10*3/uL (ref 0.1–1.0)
Monocytes Relative: 8.7 % (ref 3.0–12.0)
NEUTROS ABS: 5.3 10*3/uL (ref 1.4–7.7)
NEUTROS PCT: 67.7 % (ref 43.0–77.0)
PLATELETS: 186 10*3/uL (ref 150.0–400.0)
RBC: 4.48 Mil/uL (ref 4.22–5.81)
RDW: 15.6 % — AB (ref 11.5–15.5)
WBC: 7.8 10*3/uL (ref 4.0–10.5)

## 2015-08-19 LAB — COMPREHENSIVE METABOLIC PANEL
ALK PHOS: 80 U/L (ref 39–117)
ALT: 18 U/L (ref 0–53)
AST: 12 U/L (ref 0–37)
Albumin: 4 g/dL (ref 3.5–5.2)
BILIRUBIN TOTAL: 0.5 mg/dL (ref 0.2–1.2)
BUN: 22 mg/dL (ref 6–23)
CALCIUM: 9.1 mg/dL (ref 8.4–10.5)
CHLORIDE: 107 meq/L (ref 96–112)
CO2: 27 meq/L (ref 19–32)
CREATININE: 1.25 mg/dL (ref 0.40–1.50)
GFR: 59.14 mL/min — ABNORMAL LOW (ref >60.00)
Glucose, Bld: 173 mg/dL — ABNORMAL HIGH (ref 70–99)
POTASSIUM: 4.4 meq/L (ref 3.5–5.1)
SODIUM: 141 meq/L (ref 135–145)
Total Protein: 6.4 g/dL (ref 6.0–8.3)

## 2015-08-19 LAB — LIPID PANEL
CHOLESTEROL: 132 mg/dL (ref 0–200)
HDL: 32.4 mg/dL — ABNORMAL LOW (ref >39.00)
LDL Cholesterol: 64 mg/dL (ref 0–99)
NonHDL: 99.11
TRIGLYCERIDES: 178 mg/dL — AB (ref 0.0–149.0)
Total CHOL/HDL Ratio: 4
VLDL: 35.6 mg/dL (ref 0.0–40.0)

## 2015-08-19 LAB — HEMOGLOBIN A1C: HEMOGLOBIN A1C: 7.3 % — AB (ref 4.6–6.5)

## 2015-08-19 LAB — TSH: TSH: 3.69 u[IU]/mL (ref 0.35–4.50)

## 2015-08-19 LAB — PSA: PSA: 2.12 ng/mL (ref 0.10–4.00)

## 2015-08-26 ENCOUNTER — Encounter: Payer: Self-pay | Admitting: Gastroenterology

## 2015-08-26 ENCOUNTER — Ambulatory Visit (INDEPENDENT_AMBULATORY_CARE_PROVIDER_SITE_OTHER): Payer: Commercial Managed Care - HMO | Admitting: Family Medicine

## 2015-08-26 ENCOUNTER — Encounter: Payer: Self-pay | Admitting: Family Medicine

## 2015-08-26 VITALS — BP 134/76 | HR 71 | Temp 98.3°F | Ht 71.0 in | Wt 251.0 lb

## 2015-08-26 DIAGNOSIS — Z125 Encounter for screening for malignant neoplasm of prostate: Secondary | ICD-10-CM

## 2015-08-26 DIAGNOSIS — Z0000 Encounter for general adult medical examination without abnormal findings: Secondary | ICD-10-CM

## 2015-08-26 DIAGNOSIS — I10 Essential (primary) hypertension: Secondary | ICD-10-CM | POA: Diagnosis not present

## 2015-08-26 DIAGNOSIS — Z23 Encounter for immunization: Secondary | ICD-10-CM | POA: Diagnosis not present

## 2015-08-26 DIAGNOSIS — E669 Obesity, unspecified: Secondary | ICD-10-CM

## 2015-08-26 DIAGNOSIS — E1165 Type 2 diabetes mellitus with hyperglycemia: Secondary | ICD-10-CM

## 2015-08-26 DIAGNOSIS — E7800 Pure hypercholesterolemia, unspecified: Secondary | ICD-10-CM

## 2015-08-26 DIAGNOSIS — I2510 Atherosclerotic heart disease of native coronary artery without angina pectoris: Secondary | ICD-10-CM

## 2015-08-26 DIAGNOSIS — IMO0001 Reserved for inherently not codable concepts without codable children: Secondary | ICD-10-CM

## 2015-08-26 NOTE — Progress Notes (Signed)
Pre visit review using our clinic review tool, if applicable. No additional management support is needed unless otherwise documented below in the visit note. 

## 2015-08-26 NOTE — Progress Notes (Signed)
Subjective:    Patient ID: Ryan Garcia, male    DOB: April 09, 1934, 82 y.o.   MRN: [id number]  HPI  Here for annual medicare wellness visit as well as chronic/acute medical problems as well as annual preventative exam  I have personally reviewed the Medicare Annual Wellness questionnaire and have noted 1. The patient's medical and social history 2. Their use of alcohol, tobacco or illicit drugs 3. Their current medications and supplements 4. The patient's functional ability including ADL's, fall risks, home safety risks and hearing or visual             impairment. 5. Diet and physical activities 6. Evidence for depression or mood disorders  The patients weight, height, BMI have been recorded in the chart and visual acuity is per eye clinic.  I have made referrals, counseling and provided education to the patient based review of the above and I have provided the pt with a written personalized care plan for preventive services. Reviewed and updated provider list, see scanned forms.  See scanned forms.  Routine anticipatory guidance given to patient.  See health maintenance. Colon cancer screening  2/12 - polyp adenomatous Flu vaccine- needs one today  Tetanus vaccine 1/13  Pneumovax- complete on both  Zoster vaccine 5/14  Prostate cancer screening   Lab Results  Component Value Date   PSA 2.12 08/19/2015   PSA 1.87 05/25/2013   PSA 2.13 08/09/2009   no prostate or urinary changes  He urinates every 2-3 hours   Advance directive-does not have -needs to do a Living will and POA  Cognitive function addressed- see scanned forms- and if abnormal then additional documentation follows. -no concerns about memory   Hearing aides- has had about a month- improved from his last one  Still has a lot of trouble hearing (today I have to speak very loudly) -more problems with higher tones   PMH and SH reviewed  Meds, vitals, and allergies reviewed.   ROS: See HPI.  Otherwise negative.       bp is stable today  No cp or palpitations or headaches or edema  No side effects to medicines  BP Readings from Last 3 Encounters:  08/26/15 134/76  03/15/15 123/58  02/25/15 120/60       Chemistry      Component Value Date/Time   NA 141 08/19/2015 0853   K 4.4 08/19/2015 0853   CL 107 08/19/2015 0853   CO2 27 08/19/2015 0853   BUN 22 08/19/2015 0853   CREATININE 1.25 08/19/2015 0853      Component Value Date/Time   CALCIUM 9.1 08/19/2015 0853   ALKPHOS 80 08/19/2015 0853   AST 12 08/19/2015 0853   ALT 18 08/19/2015 0853   BILITOT 0.5 08/19/2015 0853       DM 2  Lab Results  Component Value Date   HGBA1C 7.3* 08/19/2015  he has been "slipping on sweets"- he tends to eat a Tbsp on bread after breakfast  He likes the taste of something sweet after he eats  He declines to go up on lantus  This is up from 6.7  No glipizide and lantus and metformin  He thinks he can cut back on sweets   Exercise - he gets up every am - and tries to do his daily tasks  About 30 minutes per day- and it gets his HR up  He is not a self motivator -- not motivated to exercise    Wt is down  2 lb with bmi of 35     Cholesterol Lab Results  Component Value Date   CHOL 132 08/19/2015   CHOL 122 12/30/2014   CHOL 134 10/08/2013   Lab Results  Component Value Date   HDL 32.40* 08/19/2015   HDL 29.50* 12/30/2014   HDL 32.80* 10/08/2013   Lab Results  Component Value Date   LDLCALC 64 08/19/2015   LDLCALC 68 12/30/2014   LDLCALC 76 10/08/2013   Lab Results  Component Value Date   TRIG 178.0* 08/19/2015   TRIG 124.0 12/30/2014   TRIG 128.0 10/08/2013   Lab Results  Component Value Date   CHOLHDL 4 08/19/2015   CHOLHDL 4 12/30/2014   CHOLHDL 4 10/08/2013   Lab Results  Component Value Date   LDLDIRECT 78.5 11/17/2012   LDLDIRECT 156.5 05/09/2010   LDLDIRECT 152.9 08/09/2009   HDL is too low  Trig are up from sugar    Patient Active Problem List   Diagnosis  Date Noted  . Routine general medical examination at a health care facility 08/26/2015  . Dyspnea   . Abnormal nuclear cardiac imaging test   . SOB (shortness of breath) on exertion 11/27/2013  . Encounter for Medicare annual wellness exam 05/29/2013  . Prostate cancer screening 07/15/2012  . Abnormal chest x-ray 05/24/2011  . CAD (coronary artery disease) 05/17/2011  . Chest pain 04/30/2011  . Nonspecific abnormal results of cardiovascular function study 04/30/2011  . Arm pain, left 04/14/2011  . Upper back pain, chronic 04/06/2011  . Abnormal EKG 04/06/2011  . Left arm pain 04/06/2011  . BACK PAIN 01/13/2010  . Diabetes type 2, uncontrolled ([place]) 10/04/2006  . HYPERCHOLESTEROLEMIA 10/04/2006  . Obesity 10/04/2006  . ERECTILE DYSFUNCTION 10/04/2006  . Essential hypertension 10/04/2006  . ALLERGIC RHINITIS 10/04/2006  . GERD 10/04/2006  . OSTEOARTHRITIS 10/04/2006  . SLEEP APNEA 10/04/2006  . EDEMA 10/04/2006  . SKIN CANCER, HX OF 10/04/2006   Past Medical History  Diagnosis Date  . Allergy     allergic rhinitis  . Hx of skin cancer, basal cell     many skin cancers removed/under constant treatment.  . Diabetes mellitus     type II x8 years  . GERD (gastroesophageal reflux disease)   . Hypertension   . Arthritis     OA  . BPH (benign prostatic hyperplasia)     microwave tx of prostate  . AC (acromioclavicular) joint bone spurs     lt ankle  . HLD (hyperlipidemia)     10/12: TC 208, TG 213, HDL 36, LDL 129  . CAD (coronary artery disease)     Myoview 04/16/11: mild apical ischemia, study not gated.    . Scoliosis   . Chronic upper back pain   . Obstructive sleep apnea on CPAP    Past Surgical History  Procedure Laterality Date  . Breast surgery  2009    breast lump removed benign  . Cardiac catheterization N/A 03/15/2015    Procedure: Left Heart Cath and Coronary Angiography;  Surgeon: Belva Crome, MD;  Location: [facility];  Service: Cardiovascular;   Laterality: N/A;   Social History  Substance Use Topics  . Smoking status: Never Smoker   . Smokeless tobacco: Never Used  . Alcohol Use: No   Family History  Problem Relation Age of Onset  . Heart attack Brother    Allergies  Allergen Reactions  . Loratadine     REACTION: not effective  . Simvastatin  REACTION: intolerant   Current Outpatient Prescriptions on File Prior to Visit  Medication Sig Dispense Refill  . aspirin 81 MG tablet Take 81 mg by mouth daily.      . Blood Glucose Monitoring Suppl (TRUE METRIX AIR GLUCOSE METER) W/DEVICE KIT 1 kit by Other route 2 (two) times daily. Check blood sugar twice daily and as directed. Dx E11.65 1 kit 0  . cetirizine (ZYRTEC) 10 MG tablet TAKE ONE TABLET BY MOUTH EVERY DAY 90 tablet 1  . Coenzyme Q10 (CO Q 10 PO) Take 1 tablet by mouth 2 (two) times daily.    . fluorouracil (EFUDEX) 5 % cream Apply 2 application topically daily as needed.     Marland Kitchen glipiZIDE (GLUCOTROL) 10 MG tablet Take 1 tablet (10 mg total) by mouth 2 (two) times daily with a meal. 180 tablet 0  . glucose blood (TRUE METRIX BLOOD GLUCOSE TEST) test strip Check blood sugar twice daily and as directed. Dx E11.65 200 each 1  . hydrochlorothiazide (HYDRODIURIL) 12.5 MG tablet Take 1 tablet (12.5 mg total) by mouth daily. 90 tablet 3  . Insulin Pen Needle (PEN NEEDLES) 31G X 6 MM MISC Use pen needles when taking Lantus at bedtime. Dx E11.65 100 each 3  . LANTUS SOLOSTAR 100 UNIT/ML Solostar Pen INJECT  30 UNITS SUBCUTANEOUSLY AT BEDTIME 30 mL 2  . lisinopril (PRINIVIL,ZESTRIL) 10 MG tablet TAKE ONE TABLET BY MOUTH EVERY DAY (Patient taking differently: Take 5 mg by mouth daily. TAKE ONE TABLET BY MOUTH EVERY DAY) 90 tablet 3  . metFORMIN (GLUCOPHAGE) 1000 MG tablet Take 1 tablet (1,000 mg total) by mouth 2 (two) times daily. 180 tablet 1  . nitroGLYCERIN (NITROSTAT) 0.4 MG SL tablet Place 1 tablet (0.4 mg total) under the tongue every 5 (five) minutes as needed for chest  pain. 25 tablet 6  . pravastatin (PRAVACHOL) 80 MG tablet Take 1 tablet (80 mg total) by mouth every evening. 90 tablet 3  . triamcinolone (NASACORT) 55 MCG/ACT AERO nasal inhaler USE ONE SPRAYS EACH NOSTRIL EVERY DAY    . TRUEPLUS LANCETS 30G MISC Check blood sugar twice daily and as directed. Dx E11.65 200 each 1   No current facility-administered medications on file prior to visit.    Review of Systems Review of Systems  Constitutional: Negative for fever, appetite change, and unexpected weight change. pos for general deconditioning and exercise intolerance  Eyes: Negative for pain and visual disturbance.  Respiratory: Negative for cough and shortness of breath.   Cardiovascular: Negative for cp or palpitations    Gastrointestinal: Negative for nausea, diarrhea and constipation.  Genitourinary: Negative for urgency and frequency.  Skin: Negative for pallor or rash   Neurological: Negative for weakness, light-headedness, numbness and headaches.  Hematological: Negative for adenopathy. Does not bruise/bleed easily.  Psychiatric/Behavioral: Negative for dysphoric mood. The patient is not nervous/anxious.         Objective:   Physical Exam  Constitutional: He appears well-developed and well-nourished. No distress.  obese and well appearing   HENT:  Head: Normocephalic and atraumatic.  Right Ear: External ear normal.  Left Ear: External ear normal.  Nose: Nose normal.  Mouth/Throat: Oropharynx is clear and moist.  Eyes: Conjunctivae and EOM are normal. Pupils are equal, round, and reactive to light. Right eye exhibits no discharge. Left eye exhibits no discharge. No scleral icterus.  Neck: Normal range of motion. Neck supple. No JVD present. Carotid bruit is not present. No thyromegaly present.  Cardiovascular: Normal rate,  regular rhythm, normal heart sounds and intact distal pulses.  Exam reveals no gallop.   Pulmonary/Chest: Effort normal and breath sounds normal. No respiratory  distress. He has no wheezes. He exhibits no tenderness.  Abdominal: Soft. Bowel sounds are normal. He exhibits no distension, no abdominal bruit and no mass. There is no tenderness.  Musculoskeletal: He exhibits no edema or tenderness.  Lymphadenopathy:    He has no cervical adenopathy.  Neurological: He is alert. He has normal reflexes. No cranial nerve deficit. He exhibits normal muscle tone. Coordination normal.  Skin: Skin is warm and dry. No rash noted. No erythema. No pallor.  Solar aging and SKs and AKs  Psychiatric: He has a normal mood and affect.          Assessment & Plan:   Problem List Items Addressed This Visit      Cardiovascular and Mediastinum   CAD (coronary artery disease)    Refilled prn nitro today Has not needed to use it       Essential hypertension - Primary    bp in fair control at this time  BP Readings from Last 1 Encounters:  08/26/15 134/76   No changes needed Disc lifstyle change with low sodium diet and exercise  Enc wt loss         Endocrine   Diabetes type 2, uncontrolled ([place])    Not opt controlled Lab Results  Component Value Date   HGBA1C 7.3* 08/19/2015   This is up  Confesses -more sweets/carbs and less exercise  Unsure if motivated for change Will f/u in 3 mo with lab prior  Add agent if no improvement         Other   Encounter for Medicare annual wellness exam    Reviewed health habits including diet and exercise and skin cancer prevention Reviewed appropriate screening tests for age  Also reviewed health mt list, fam hx and immunization status , as well as social and family history   See HPI Labs reviewed You may be due for a colonoscopy -we will find out  Flu shot today  (even if you have a cold or seasonal allergies- it is ok as long as you do not have a fever) I will refill your nitro Please work on the Advance directive for you and your wife (this is the living will and power of attorney) - the blue booklets I  gave you  Try to get more exercise- it will increase your stamina and help you stay independent longer  Do cut back on sweets -diabetes is not in control  Follow up in 3 months for diabetes with labs prior   Take care of yourself       HYPERCHOLESTEROLEMIA    Disc goals for lipids and reasons to control them Rev labs with pt Rev low sat fat diet in detail HDL low- recommend exercise and omega 3 supple/food Triglycerides are likely up due to inc blood sugar  Re check 3 mo      Obesity    Discussed how this problem influences overall health and the risks it imposes  Reviewed plan for weight loss with lower calorie diet (via better food choices and also portion control or program like weight watchers) and exercise building up to or more than 30 minutes 5 days per week including some aerobic activity   Pt is open to gradually inc his activity-which would help general stamina and wt as well       Prostate  cancer screening    Lab Results  Component Value Date   PSA 2.12 08/19/2015   PSA 1.87 05/25/2013   PSA 2.13 08/09/2009   Fairly stable No change in BPH symptoms and does not desire further tx       Routine general medical examination at a health care facility    Reviewed health habits including diet and exercise and skin cancer prevention Reviewed appropriate screening tests for age  Also reviewed health mt list, fam hx and immunization status , as well as social and family history   See HPI Labs reviewed You may be due for a colonoscopy -we will find out  Flu shot today  (even if you have a cold or seasonal allergies- it is ok as long as you do not have a fever) I will refill your nitro Please work on the Advance directive for you and your wife (this is the living will and power of attorney) - the blue booklets I gave you  Try to get more exercise- it will increase your stamina and help you stay independent longer  Do cut back on sweets -diabetes is not in control  Follow  up in 3 months for diabetes with labs prior   Take care of yourself        Other Visit Diagnoses    Need for influenza vaccination        Relevant Orders    Flu Vaccine QUAD 36+ mos PF IM (Fluarix & Fluzone Quad PF) (Completed)

## 2015-08-26 NOTE — Patient Instructions (Addendum)
You may be due for a colonoscopy -we will find out  Flu shot today  (even if you have a cold or seasonal allergies- it is ok as long as you do not have a fever) I will refill your nitro Please work on the Advance directive for you and your wife (this is the living will and power of attorney) - the blue booklets I gave you  Try to get more exercise- it will increase your stamina and help you stay independent longer  Do cut back on sweets -diabetes is not in control  Follow up in 3 months for diabetes with labs prior   Take care of yourself

## 2015-08-28 NOTE — Assessment & Plan Note (Signed)
Not opt controlled Lab Results  Component Value Date   HGBA1C 7.3* 08/19/2015   This is up  Confesses -more sweets/carbs and less exercise  Unsure if motivated for change Will f/u in 3 mo with lab prior  Add agent if no improvement

## 2015-08-28 NOTE — Assessment & Plan Note (Signed)
Discussed how this problem influences overall health and the risks it imposes  Reviewed plan for weight loss with lower calorie diet (via better food choices and also portion control or program like weight watchers) and exercise building up to or more than 30 minutes 5 days per week including some aerobic activity   Pt is open to gradually inc his activity-which would help general stamina and wt as well

## 2015-08-28 NOTE — Assessment & Plan Note (Signed)
bp in fair control at this time  BP Readings from Last 1 Encounters:  08/26/15 134/76   No changes needed Disc lifstyle change with low sodium diet and exercise  Enc wt loss

## 2015-08-28 NOTE — Assessment & Plan Note (Signed)
Reviewed health habits including diet and exercise and skin cancer prevention Reviewed appropriate screening tests for age  Also reviewed health mt list, fam hx and immunization status , as well as social and family history   See HPI Labs reviewed You may be due for a colonoscopy -we will find out  Flu shot today  (even if you have a cold or seasonal allergies- it is ok as long as you do not have a fever) I will refill your nitro Please work on the Advance directive for you and your wife (this is the living will and power of attorney) - the blue booklets I gave you  Try to get more exercise- it will increase your stamina and help you stay independent longer  Do cut back on sweets -diabetes is not in control  Follow up in 3 months for diabetes with labs prior   Take care of yourself

## 2015-08-28 NOTE — Assessment & Plan Note (Signed)
Lab Results  Component Value Date   PSA 2.12 08/19/2015   PSA 1.87 05/25/2013   PSA 2.13 08/09/2009   Fairly stable No change in BPH symptoms and does not desire further tx

## 2015-08-28 NOTE — Assessment & Plan Note (Signed)
Disc goals for lipids and reasons to control them Rev labs with pt Rev low sat fat diet in detail HDL low- recommend exercise and omega 3 supple/food Triglycerides are likely up due to inc blood sugar  Re check 3 mo

## 2015-08-28 NOTE — Assessment & Plan Note (Signed)
Refilled prn nitro today Has not needed to use it

## 2015-09-08 DIAGNOSIS — Z85828 Personal history of other malignant neoplasm of skin: Secondary | ICD-10-CM | POA: Diagnosis not present

## 2015-09-08 DIAGNOSIS — Z08 Encounter for follow-up examination after completed treatment for malignant neoplasm: Secondary | ICD-10-CM | POA: Diagnosis not present

## 2015-09-08 DIAGNOSIS — L570 Actinic keratosis: Secondary | ICD-10-CM | POA: Diagnosis not present

## 2015-09-08 DIAGNOSIS — D2271 Melanocytic nevi of right lower limb, including hip: Secondary | ICD-10-CM | POA: Diagnosis not present

## 2015-09-08 DIAGNOSIS — X32XXXA Exposure to sunlight, initial encounter: Secondary | ICD-10-CM | POA: Diagnosis not present

## 2015-09-12 ENCOUNTER — Other Ambulatory Visit: Payer: Self-pay

## 2015-09-12 MED ORDER — NITROGLYCERIN 0.4 MG SL SUBL: 0.4000 mg | SUBLINGUAL_TABLET | SUBLINGUAL | Status: DC | PRN

## 2015-09-12 MED ORDER — CETIRIZINE HCL 10 MG PO TABS: 10.0000 mg | ORAL_TABLET | Freq: Every day | ORAL | Status: DC

## 2015-09-12 NOTE — Telephone Encounter (Signed)
Mrs Asel request refill cetirizine and ntg to [facility]. Last annual exam on 08/26/15. Pt advised refills done as requested and pt voiced understanding.

## 2015-09-22 ENCOUNTER — Other Ambulatory Visit: Payer: Self-pay | Admitting: Family Medicine

## 2015-10-01 ENCOUNTER — Other Ambulatory Visit: Payer: Self-pay | Admitting: Family Medicine

## 2015-10-05 ENCOUNTER — Ambulatory Visit (AMBULATORY_SURGERY_CENTER): Payer: Self-pay

## 2015-10-05 VITALS — Ht 71.0 in | Wt 251.6 lb

## 2015-10-05 DIAGNOSIS — Z860100 Personal history of colon polyps, unspecified: Secondary | ICD-10-CM

## 2015-10-05 NOTE — Progress Notes (Signed)
No allergies to eggs or soy No past problems with anesthesia No home oxygen No diet/weight loss meds  No internet 

## 2015-10-18 ENCOUNTER — Other Ambulatory Visit: Payer: Self-pay | Admitting: Family Medicine

## 2015-10-19 ENCOUNTER — Ambulatory Visit: Payer: Commercial Managed Care - HMO | Admitting: Gastroenterology

## 2015-10-19 NOTE — Progress Notes (Signed)
After review of this patient's medical history of dyspnea as probable angina equivalent, abnormal cardiac stress test last August and multivessel CAD on 03/2015 catheterization, I felt the cardiac risks of sedation outweighed the expected benefits of a surveillance colonoscopy in this patient.  His prior colonoscopy and pathology reports were also reviewed.  Therefore, I canceled the procedure after a discussion with the patient and his wife in the pre-procedure area.  I am not planning to reschedule the procedure.

## 2015-11-12 ENCOUNTER — Telehealth: Payer: Self-pay | Admitting: Family Medicine

## 2015-11-12 DIAGNOSIS — I10 Essential (primary) hypertension: Secondary | ICD-10-CM

## 2015-11-12 DIAGNOSIS — IMO0001 Reserved for inherently not codable concepts without codable children: Secondary | ICD-10-CM

## 2015-11-12 DIAGNOSIS — E1165 Type 2 diabetes mellitus with hyperglycemia: Secondary | ICD-10-CM

## 2015-11-12 NOTE — Telephone Encounter (Signed)
-----   Message from Ellamae Sia sent at 11/09/2015  4:24 PM EDT ----- Regarding: Lab orders for Friday, 5.19.17 Lab orders for a 3 month follow up appt.

## 2015-11-18 ENCOUNTER — Other Ambulatory Visit (INDEPENDENT_AMBULATORY_CARE_PROVIDER_SITE_OTHER): Payer: Commercial Managed Care - HMO

## 2015-11-18 DIAGNOSIS — R5383 Other fatigue: Secondary | ICD-10-CM

## 2015-11-18 DIAGNOSIS — E1165 Type 2 diabetes mellitus with hyperglycemia: Secondary | ICD-10-CM

## 2015-11-18 DIAGNOSIS — I10 Essential (primary) hypertension: Secondary | ICD-10-CM

## 2015-11-18 DIAGNOSIS — IMO0001 Reserved for inherently not codable concepts without codable children: Secondary | ICD-10-CM

## 2015-11-18 LAB — HEMOGLOBIN A1C: Hgb A1c MFr Bld: 6.6 % — ABNORMAL HIGH (ref 4.6–6.5)

## 2015-11-18 LAB — COMPREHENSIVE METABOLIC PANEL
ALBUMIN: 4 g/dL (ref 3.5–5.2)
ALK PHOS: 68 U/L (ref 39–117)
ALT: 18 U/L (ref 0–53)
AST: 16 U/L (ref 0–37)
BILIRUBIN TOTAL: 0.6 mg/dL (ref 0.2–1.2)
BUN: 27 mg/dL — ABNORMAL HIGH (ref 6–23)
CALCIUM: 9 mg/dL (ref 8.4–10.5)
CO2: 25 mEq/L (ref 19–32)
Chloride: 110 mEq/L (ref 96–112)
Creatinine, Ser: 1.21 mg/dL (ref 0.40–1.50)
GFR: 61.37 mL/min (ref >60.00)
GLUCOSE: 134 mg/dL — AB (ref 70–99)
Potassium: 4 mEq/L (ref 3.5–5.1)
Sodium: 142 mEq/L (ref 135–145)
TOTAL PROTEIN: 6.4 g/dL (ref 6.0–8.3)

## 2015-11-18 LAB — LIPID PANEL
Cholesterol: 125 mg/dL (ref 0–200)
HDL: 28 mg/dL — ABNORMAL LOW (ref >39.00)
LDL Cholesterol: 76 mg/dL (ref 0–99)
NonHDL: 96.59
Total CHOL/HDL Ratio: 4
Triglycerides: 101 mg/dL (ref 0.0–149.0)
VLDL: 20.2 mg/dL (ref 0.0–40.0)

## 2015-11-18 LAB — VITAMIN B12: VITAMIN B 12: 143 pg/mL — AB (ref 211–911)

## 2015-11-22 ENCOUNTER — Encounter: Payer: Self-pay | Admitting: Family Medicine

## 2015-11-22 ENCOUNTER — Ambulatory Visit (INDEPENDENT_AMBULATORY_CARE_PROVIDER_SITE_OTHER): Payer: Commercial Managed Care - HMO | Admitting: Family Medicine

## 2015-11-22 VITALS — BP 128/66 | HR 69 | Temp 98.3°F | Ht 71.0 in | Wt 246.0 lb

## 2015-11-22 DIAGNOSIS — E538 Deficiency of other specified B group vitamins: Secondary | ICD-10-CM | POA: Insufficient documentation

## 2015-11-22 DIAGNOSIS — E1165 Type 2 diabetes mellitus with hyperglycemia: Secondary | ICD-10-CM

## 2015-11-22 DIAGNOSIS — IMO0001 Reserved for inherently not codable concepts without codable children: Secondary | ICD-10-CM

## 2015-11-22 DIAGNOSIS — I10 Essential (primary) hypertension: Secondary | ICD-10-CM

## 2015-11-22 MED ORDER — CYANOCOBALAMIN 1000 MCG/ML IJ SOLN
1000.0000 ug | Freq: Once | INTRAMUSCULAR | Status: AC
  Administered 2015-11-22: 1000 ug via INTRAMUSCULAR

## 2015-11-22 NOTE — Progress Notes (Signed)
Pre visit review using our clinic review tool, if applicable. No additional management support is needed unless otherwise documented below in the visit note. 

## 2015-11-22 NOTE — Assessment & Plan Note (Signed)
Lab Results  Component Value Date   VITAMINB12 143* 11/18/2015   Will begin series of 4 b12 shots in 4 weeks and then check level in 5-6 wk  Also enc b12 supplement orally otc  Also balanced diet

## 2015-11-22 NOTE — Progress Notes (Signed)
Subjective:    Patient ID: Ryan Garcia, male    DOB: 1934/01/28, 82 y.o.   MRN: [id number]  HPI Here for f/u of chronic medical problems  Wt is down 5 lb with bmi of 34 Working on it   bp is stable today  No cp or palpitations or headaches or edema  No side effects to medicines  BP Readings from Last 3 Encounters:  11/22/15 128/66  08/26/15 134/76  03/15/15 123/58     Diabetes Home sugar results  DM diet - he "quit sugar" for over a month- now adding a back in (puts 1 Tbsp of apple butter in his cereal) Stopped sweets and sugar beverages  Exercise - working - staying active (no exercise program)  Symptoms- none  A1C last  Lab Results  Component Value Date   HGBA1C 6.6* 11/18/2015  improved from 7.3!  No problems with medications  Renal protection- ace  Last eye exam utd 12/16   Cholesterol Lab Results  Component Value Date   CHOL 125 11/18/2015   CHOL 132 08/19/2015   CHOL 122 12/30/2014   Lab Results  Component Value Date   HDL 28.00* 11/18/2015   HDL 32.40* 08/19/2015   HDL 29.50* 12/30/2014   Lab Results  Component Value Date   LDLCALC 76 11/18/2015   LDLCALC 64 08/19/2015   LDLCALC 68 12/30/2014   Lab Results  Component Value Date   TRIG 101.0 11/18/2015   TRIG 178.0* 08/19/2015   TRIG 124.0 12/30/2014   Lab Results  Component Value Date   CHOLHDL 4 11/18/2015   CHOLHDL 4 08/19/2015   CHOLHDL 4 12/30/2014   Lab Results  Component Value Date   LDLDIRECT 78.5 11/17/2012   LDLDIRECT 156.5 05/09/2010   LDLDIRECT 152.9 08/09/2009   Pravastatin and diet  HDL is low  Trig came down a lot with lower sugar diet   B12 is low  Lab Results  Component Value Date   VITAMINB12 143* 11/18/2015   He  is tired -why we checked it     Patient Active Problem List   Diagnosis Date Noted  . B12 deficiency 11/22/2015  . Routine general medical examination at a health care facility 08/26/2015  . Dyspnea   . Abnormal nuclear cardiac imaging test     . SOB (shortness of breath) on exertion 11/27/2013  . Encounter for Medicare annual wellness exam 05/29/2013  . Prostate cancer screening 07/15/2012  . Abnormal chest x-ray 05/24/2011  . CAD (coronary artery disease) 05/17/2011  . Chest pain 04/30/2011  . Nonspecific abnormal results of cardiovascular function study 04/30/2011  . Arm pain, left 04/14/2011  . Upper back pain, chronic 04/06/2011  . Abnormal EKG 04/06/2011  . Left arm pain 04/06/2011  . BACK PAIN 01/13/2010  . Diabetes type 2, uncontrolled ([place]) 10/04/2006  . HYPERCHOLESTEROLEMIA 10/04/2006  . Obesity 10/04/2006  . ERECTILE DYSFUNCTION 10/04/2006  . Essential hypertension 10/04/2006  . ALLERGIC RHINITIS 10/04/2006  . GERD 10/04/2006  . OSTEOARTHRITIS 10/04/2006  . SLEEP APNEA 10/04/2006  . EDEMA 10/04/2006  . SKIN CANCER, HX OF 10/04/2006   Past Medical History  Diagnosis Date  . Allergy     allergic rhinitis  . Hx of skin cancer, basal cell     many skin cancers removed/under constant treatment.  . Diabetes mellitus     type II x8 years  . GERD (gastroesophageal reflux disease)   . Hypertension   . Arthritis     OA  . BPH (  benign prostatic hyperplasia)     microwave tx of prostate  . AC (acromioclavicular) joint bone spurs     lt ankle  . HLD (hyperlipidemia)     10/12: TC 208, TG 213, HDL 36, LDL 129  . CAD (coronary artery disease)     Myoview 04/16/11: mild apical ischemia, study not gated.    . Scoliosis   . Chronic upper back pain   . Obstructive sleep apnea on CPAP    Past Surgical History  Procedure Laterality Date  . Breast surgery  2009    breast lump removed benign  . Cardiac catheterization N/A 03/15/2015    Procedure: Left Heart Cath and Coronary Angiography;  Surgeon: Belva Crome, MD;  Location: [facility];  Service: Cardiovascular;  Laterality: N/A;   Social History  Substance Use Topics  . Smoking status: Never Smoker   . Smokeless tobacco: Never Used  . Alcohol Use:  No   Family History  Problem Relation Age of Onset  . Heart attack Brother   . Colon cancer Neg Hx    Allergies  Allergen Reactions  . Loratadine     REACTION: not effective  . Simvastatin     REACTION: intolerant   Current Outpatient Prescriptions on File Prior to Visit  Medication Sig Dispense Refill  . aspirin 81 MG tablet Take 81 mg by mouth daily.      . Blood Glucose Monitoring Suppl (TRUE METRIX AIR GLUCOSE METER) W/DEVICE KIT 1 kit by Other route 2 (two) times daily. Check blood sugar twice daily and as directed. Dx E11.65 1 kit 0  . cetirizine (ZYRTEC) 10 MG tablet Take 1 tablet (10 mg total) by mouth daily. 90 tablet 1  . Coenzyme Q10 (CO Q 10 PO) Take 1 tablet by mouth 2 (two) times daily.    . fluorouracil (EFUDEX) 5 % cream Apply 2 application topically daily as needed.     Marland Kitchen glipiZIDE (GLUCOTROL) 10 MG tablet TAKE 1 TABLET TWICE DAILY WITH MEALS 180 tablet 0  . hydrochlorothiazide (HYDRODIURIL) 12.5 MG tablet Take 1 tablet (12.5 mg total) by mouth daily. 90 tablet 3  . Insulin Pen Needle (PEN NEEDLES) 31G X 6 MM MISC Use pen needles when taking Lantus at bedtime. Dx E11.65 100 each 3  . LANTUS SOLOSTAR 100 UNIT/ML Solostar Pen INJECT  30 UNITS SUBCUTANEOUSLY AT BEDTIME 30 mL 2  . lisinopril (PRINIVIL,ZESTRIL) 10 MG tablet TAKE ONE TABLET BY MOUTH EVERY DAY (Patient taking differently: Take 5 mg by mouth daily. TAKE ONE TABLET BY MOUTH EVERY DAY) 90 tablet 3  . metFORMIN (GLUCOPHAGE) 1000 MG tablet TAKE 1 TABLET TWICE DAILY 180 tablet 0  . nitroGLYCERIN (NITROSTAT) 0.4 MG SL tablet Place 1 tablet (0.4 mg total) under the tongue every 5 (five) minutes as needed for chest pain. 25 tablet 0  . pravastatin (PRAVACHOL) 80 MG tablet Take 1 tablet (80 mg total) by mouth every evening. 90 tablet 3  . triamcinolone (NASACORT) 55 MCG/ACT AERO nasal inhaler USE ONE SPRAYS EACH NOSTRIL EVERY DAY    . TRUE METRIX BLOOD GLUCOSE TEST test strip CHECK BLOOD SUGAR TWICE DAILY AND AS  DIRECTED 300 each 1  . TRUEPLUS LANCETS 30G MISC Check blood sugar twice daily and as directed. Dx E11.65 200 each 1   No current facility-administered medications on file prior to visit.    Review of Systems Review of Systems  Constitutional: Negative for fever, appetite change,  and unexpected weight change.  Eyes:  Negative for pain and visual disturbance.  Respiratory: Negative for cough and shortness of breath.   Cardiovascular: Negative for cp or palpitations    Gastrointestinal: Negative for nausea, diarrhea and constipation.  Genitourinary: Negative for urgency and frequency.  Skin: Negative for pallor or rash   Neurological: Negative for weakness, light-headedness, numbness and headaches. neg for symptoms of periph neuropathy Hematological: Negative for adenopathy. Does not bruise/bleed easily.  Psychiatric/Behavioral: Negative for dysphoric mood. The patient is not nervous/anxious.         Objective:   Physical Exam  Constitutional: He appears well-developed and well-nourished. No distress.  obese and well appearing   HENT:  Head: Normocephalic and atraumatic.  Mouth/Throat: Oropharynx is clear and moist.  Eyes: Conjunctivae and EOM are normal. Pupils are equal, round, and reactive to light.  Neck: Normal range of motion. Neck supple. No JVD present. Carotid bruit is not present. No thyromegaly present.  Cardiovascular: Normal rate, regular rhythm, normal heart sounds and intact distal pulses.  Exam reveals no gallop.   Pulmonary/Chest: Effort normal and breath sounds normal. No respiratory distress. He has no wheezes. He has no rales.  No crackles  Abdominal: Soft. Bowel sounds are normal. He exhibits no distension, no abdominal bruit and no mass. There is no tenderness.  Musculoskeletal: He exhibits no edema.  Lymphadenopathy:    He has no cervical adenopathy.  Neurological: He is alert. He has normal reflexes.  Skin: Skin is warm and dry. No rash noted.  Psychiatric:  He has a normal mood and affect.          Assessment & Plan:   Problem List Items Addressed This Visit      Cardiovascular and Mediastinum   Essential hypertension - Primary    bp in fair control at this time  BP Readings from Last 1 Encounters:  11/22/15 128/66   No changes needed Disc lifstyle change with low sodium diet and exercise  Labs reviewed  Enc good habits         Digestive   B12 deficiency    Lab Results  Component Value Date   VITAMINB12 143* 11/18/2015   Will begin series of 4 b12 shots in 4 weeks and then check level in 5-6 wk  Also enc b12 supplement orally otc  Also balanced diet       Relevant Medications   cyanocobalamin ((VITAMIN B-12)) injection 1,000 mcg (Completed)     Endocrine   Diabetes type 2, uncontrolled ([place])    Lab Results  Component Value Date   HGBA1C 6.6* 11/18/2015   This is improved from 7.3 Commended on better habits Enc exercise  F/u 6 mo with lab prior

## 2015-11-22 NOTE — Assessment & Plan Note (Signed)
Lab Results  Component Value Date   HGBA1C 6.6* 11/18/2015   This is improved from 7.3 Commended on better habits Enc exercise  F/u 6 mo with lab prior

## 2015-11-22 NOTE — Assessment & Plan Note (Signed)
bp in fair control at this time  BP Readings from Last 1 Encounters:  11/22/15 128/66   No changes needed Disc lifstyle change with low sodium diet and exercise  Labs reviewed  Enc good habits

## 2015-11-22 NOTE — Patient Instructions (Addendum)
First B12 shot today Also buy a B12 supplement over the counter- 1000 mcg daily  Hope this will help energy  Diabetes looks better  Cholesterol is stable (stay active/exercise to help the good cholesterol (HDL) I want to check your B12 level in about 6 weeks  Keep up the good work

## 2015-11-29 ENCOUNTER — Ambulatory Visit (INDEPENDENT_AMBULATORY_CARE_PROVIDER_SITE_OTHER): Payer: Commercial Managed Care - HMO | Admitting: *Deleted

## 2015-11-29 DIAGNOSIS — E538 Deficiency of other specified B group vitamins: Secondary | ICD-10-CM | POA: Diagnosis not present

## 2015-11-29 MED ORDER — CYANOCOBALAMIN 1000 MCG/ML IJ SOLN
1000.0000 ug | Freq: Once | INTRAMUSCULAR | Status: AC
  Administered 2015-11-29: 1000 ug via INTRAMUSCULAR

## 2015-12-01 ENCOUNTER — Other Ambulatory Visit: Payer: Self-pay | Admitting: Cardiology

## 2015-12-01 NOTE — Telephone Encounter (Signed)
Rx(s) sent to pharmacy electronically.  

## 2015-12-06 ENCOUNTER — Ambulatory Visit (INDEPENDENT_AMBULATORY_CARE_PROVIDER_SITE_OTHER): Payer: Commercial Managed Care - HMO | Admitting: *Deleted

## 2015-12-06 DIAGNOSIS — D519 Vitamin B12 deficiency anemia, unspecified: Secondary | ICD-10-CM | POA: Diagnosis not present

## 2015-12-06 MED ORDER — CYANOCOBALAMIN 1000 MCG/ML IJ SOLN
1000.0000 ug | Freq: Once | INTRAMUSCULAR | Status: AC
  Administered 2015-12-06: 1000 ug via INTRAMUSCULAR

## 2015-12-13 ENCOUNTER — Ambulatory Visit: Payer: Commercial Managed Care - HMO

## 2015-12-16 ENCOUNTER — Ambulatory Visit: Payer: Commercial Managed Care - HMO

## 2015-12-21 ENCOUNTER — Ambulatory Visit (INDEPENDENT_AMBULATORY_CARE_PROVIDER_SITE_OTHER): Payer: Commercial Managed Care - HMO | Admitting: *Deleted

## 2015-12-21 ENCOUNTER — Other Ambulatory Visit: Payer: Self-pay | Admitting: Family Medicine

## 2015-12-21 DIAGNOSIS — E538 Deficiency of other specified B group vitamins: Secondary | ICD-10-CM | POA: Diagnosis not present

## 2015-12-21 MED ORDER — CYANOCOBALAMIN 1000 MCG/ML IJ SOLN
1000.0000 ug | Freq: Once | INTRAMUSCULAR | Status: AC
  Administered 2015-12-21: 1000 ug via INTRAMUSCULAR

## 2016-01-12 ENCOUNTER — Other Ambulatory Visit: Payer: Commercial Managed Care - HMO

## 2016-01-13 ENCOUNTER — Other Ambulatory Visit (INDEPENDENT_AMBULATORY_CARE_PROVIDER_SITE_OTHER): Payer: Commercial Managed Care - HMO

## 2016-01-13 DIAGNOSIS — E538 Deficiency of other specified B group vitamins: Secondary | ICD-10-CM | POA: Diagnosis not present

## 2016-01-13 LAB — VITAMIN B12: VITAMIN B 12: 503 pg/mL (ref 211–911)

## 2016-01-17 ENCOUNTER — Telehealth: Payer: Self-pay | Admitting: Family Medicine

## 2016-01-17 DIAGNOSIS — L570 Actinic keratosis: Secondary | ICD-10-CM | POA: Diagnosis not present

## 2016-01-17 DIAGNOSIS — Z85828 Personal history of other malignant neoplasm of skin: Secondary | ICD-10-CM | POA: Diagnosis not present

## 2016-01-17 DIAGNOSIS — D485 Neoplasm of uncertain behavior of skin: Secondary | ICD-10-CM | POA: Diagnosis not present

## 2016-01-17 DIAGNOSIS — C44329 Squamous cell carcinoma of skin of other parts of face: Secondary | ICD-10-CM | POA: Diagnosis not present

## 2016-01-17 DIAGNOSIS — X32XXXA Exposure to sunlight, initial encounter: Secondary | ICD-10-CM | POA: Diagnosis not present

## 2016-01-17 NOTE — Telephone Encounter (Signed)
Please call wife back [phone] Thanks

## 2016-01-17 NOTE — Telephone Encounter (Signed)
Called the wife and informed of b12 results/scheduled his next injection.

## 2016-02-08 ENCOUNTER — Ambulatory Visit (INDEPENDENT_AMBULATORY_CARE_PROVIDER_SITE_OTHER): Payer: Commercial Managed Care - HMO | Admitting: *Deleted

## 2016-02-08 DIAGNOSIS — E538 Deficiency of other specified B group vitamins: Secondary | ICD-10-CM

## 2016-02-08 MED ORDER — CYANOCOBALAMIN 1000 MCG/ML IJ SOLN
1000.0000 ug | Freq: Once | INTRAMUSCULAR | Status: AC
  Administered 2016-02-08: 1000 ug via INTRAMUSCULAR

## 2016-02-20 ENCOUNTER — Other Ambulatory Visit: Payer: Self-pay | Admitting: Family Medicine

## 2016-02-28 ENCOUNTER — Other Ambulatory Visit: Payer: Self-pay | Admitting: *Deleted

## 2016-02-28 MED ORDER — METFORMIN HCL 1000 MG PO TABS: 1000.0000 mg | ORAL_TABLET | Freq: Two times a day (BID) | ORAL | 0 refills | Status: DC

## 2016-02-28 NOTE — Telephone Encounter (Signed)
Patient's [place] called [place] script sent to [facility] for his Metformin. Mrs. Zug stated that they do not want to drive home to get his medication and they will just pay for it because the insurance company will probably not pay for it since it was just filled. Patient got on the phone and stated that he would like #[phone] sent to the pharmacy. Advised patient that script will be sent to the pharmacy and to check with them later today.  Refill sent to pharmacy electronically per patient's request.

## 2016-03-12 ENCOUNTER — Other Ambulatory Visit: Payer: Self-pay | Admitting: Cardiology

## 2016-03-12 DIAGNOSIS — C44329 Squamous cell carcinoma of skin of other parts of face: Secondary | ICD-10-CM | POA: Diagnosis not present

## 2016-03-12 NOTE — Telephone Encounter (Signed)
Rx request sent to pharmacy.  

## 2016-03-13 ENCOUNTER — Other Ambulatory Visit: Payer: Self-pay | Admitting: Family Medicine

## 2016-03-14 NOTE — Progress Notes (Signed)
HPI: FU CAD. Cath in Nov 2012 revealed normal LM. The LAD demonstrated a long 70-75% diffuse stenosis prior to the bifurcation. It was calcified. The diagonal had a 75% stenosis and there were tandem distal LAD lesions of 70-75%, with the vessel appearing typical for diabetes. The circumflex had a tiny ramus without disease, a moderately large OM1 with a 75% area of proximal disease. The AV vessel, leading to the OM2 had tandem 50% lesions. Both vessels appeared diabetic, but graftable. The RCA has a prox mid lesion of 60-70% . The PDA divides into two branches, and the larger branch has an 80% hypodense area of disease. This vessel is also graftable. EF 60. Dr Lia Foyer felt patient should have either medical therapy or CABG and not PCI. I reviewed films with Dr Percival Spanish and we elected medical therapy. Nuclear study repeated August 2016 secondary to dyspnea. Patient had ST changes and ischemia in the LAD/RCA territories. Patient had repeat catheterization September 2016. He had severe three-vessel coronary disease and normal LV function; CAD felt essentially unchanged. He has not been seen since then. Since he was last seen, he notes some dyspnea on exertion and fatigue. In hot weather he has some chest pressure with walking. Symptoms are essentially unchanged compared to previous. He denies orthopnea or PND. Mild pedal edema.  Current Outpatient Prescriptions  Medication Sig Dispense Refill  . aspirin 81 MG tablet Take 81 mg by mouth daily.      . Blood Glucose Monitoring Suppl (TRUE METRIX AIR GLUCOSE METER) W/DEVICE KIT 1 kit by Other route 2 (two) times daily. Check blood sugar twice daily and as directed. Dx E11.65 1 kit 0  . cetirizine (ZYRTEC) 10 MG tablet Take 1 tablet (10 mg total) by mouth daily. 90 tablet 1  . Coenzyme Q10 (CO Q 10 PO) Take 1 tablet by mouth 2 (two) times daily.    . Cyanocobalamin (VITAMIN B12 PO) Take 1 tablet by mouth daily.    . fluorouracil (EFUDEX) 5 % cream Apply  2 application topically daily as needed.     Marland Kitchen glipiZIDE (GLUCOTROL) 10 MG tablet TAKE 1 TABLET TWICE DAILY WITH MEALS 180 tablet 1  . hydrochlorothiazide (HYDRODIURIL) 12.5 MG tablet TAKE 1 TABLET EVERY DAY 90 tablet 3  . Insulin Pen Needle (PEN NEEDLES) 31G X 6 MM MISC Use pen needles when taking Lantus at bedtime. Dx E11.65 100 each 3  . LANTUS SOLOSTAR 100 UNIT/ML Solostar Pen INJECT  30 UNITS SUBCUTANEOUSLY AT BEDTIME 30 mL 2  . lisinopril (PRINIVIL,ZESTRIL) 10 MG tablet Take 1 tablet (10 mg total) by mouth daily. PLEASE CONTACT OFFICE FOR ADDITIONAL REFILLS 90 tablet 0  . Magnesium Oxide POWD Take by mouth daily.    . metFORMIN (GLUCOPHAGE) 1000 MG tablet Take 1 tablet (1,000 mg total) by mouth 2 (two) times daily. 30 tablet 0  . nitroGLYCERIN (NITROSTAT) 0.4 MG SL tablet Place 1 tablet (0.4 mg total) under the tongue every 5 (five) minutes as needed for chest pain. 25 tablet 0  . pravastatin (PRAVACHOL) 80 MG tablet Take 1 tablet (80 mg total) by mouth every evening. 90 tablet 3  . triamcinolone (NASACORT) 55 MCG/ACT AERO nasal inhaler USE ONE SPRAYS EACH NOSTRIL EVERY DAY    . TRUE METRIX BLOOD GLUCOSE TEST test strip CHECK BLOOD SUGAR TWICE DAILY AND AS DIRECTED 300 each 1  . TRUEPLUS LANCETS 30G MISC Check blood sugar twice daily and as directed. Dx E11.65 200 each 1  No current facility-administered medications for this visit.      Past Medical History:  Diagnosis Date  . AC (acromioclavicular) joint bone spurs    lt ankle  . Allergy    allergic rhinitis  . Arthritis    OA  . BPH (benign prostatic hyperplasia)    microwave tx of prostate  . CAD (coronary artery disease)    Myoview 04/16/11: mild apical ischemia, study not gated.    . Chronic upper back pain   . Diabetes mellitus    type II x8 years  . GERD (gastroesophageal reflux disease)   . HLD (hyperlipidemia)    10/12: TC 208, TG 213, HDL 36, LDL 129  . Hx of skin cancer, basal cell    many skin cancers  removed/under constant treatment.  . Hypertension   . Obstructive sleep apnea on CPAP   . Scoliosis     Past Surgical History:  Procedure Laterality Date  . BREAST SURGERY  2009   breast lump removed benign  . CARDIAC CATHETERIZATION N/A 03/15/2015   Procedure: Left Heart Cath and Coronary Angiography;  Surgeon: Belva Crome, MD;  Location: [facility];  Service: Cardiovascular;  Laterality: N/A;    Social History   Social History  . Marital status: Married    Spouse name: N/A  . Number of children: N/A  . Years of education: N/A   Occupational History  . Not on file.   Social History Main Topics  . Smoking status: Never Smoker  . Smokeless tobacco: Never Used  . Alcohol use No  . Drug use: No  . Sexual activity: Not on file   Other Topics Concern  . Not on file   Social History Narrative   Married    Physically active hard of hearing    Family History  Problem Relation Age of Onset  . Heart attack Brother   . Colon cancer Neg Hx     ROS: Fatigued but no fevers or chills, productive cough, hemoptysis, dysphasia, odynophagia, melena, hematochezia, dysuria, hematuria, rash, seizure activity, orthopnea, PND, claudication. Remaining systems are negative.  Physical Exam: Well-developed well-nourished in no acute distress.  Skin is warm and dry.  HEENT is normal.  Neck is supple.  Chest is clear to auscultation with normal expansion.  Cardiovascular exam is regular rate and rhythm.  Abdominal exam nontender or distended. No masses palpated. Extremities show trace edema. neuro grossly intact  ECG-Sinus rhythm with occasional PACs. Normal axis. No ST changes.  A/P  1 hyperlipidemia-continue statin. Patient did not tolerate Lipitor previously. Continue pravastatin. Liver functions normal in May and LDL 76.  2 hypertension-blood pressure controlled. Continue present medications.  3 coronary artery disease-continue aspirin and statin.  4 dyspnea-he  continues to have some dyspnea on exertion but unchanged. We discussed options today and certainly coronary disease could be contributing. As outlined options would be either medical therapy for coronary artery bypass graft. He would like to avoid surgery. We will continue with medicines unless his symptoms change or worsen.  Kirk Ruths, MD

## 2016-03-16 ENCOUNTER — Encounter: Payer: Self-pay | Admitting: Cardiology

## 2016-03-19 ENCOUNTER — Encounter: Payer: Self-pay | Admitting: Cardiology

## 2016-03-19 ENCOUNTER — Ambulatory Visit (INDEPENDENT_AMBULATORY_CARE_PROVIDER_SITE_OTHER): Payer: Commercial Managed Care - HMO | Admitting: Cardiology

## 2016-03-19 VITALS — BP 134/64 | HR 61 | Ht 72.0 in | Wt 252.0 lb

## 2016-03-19 DIAGNOSIS — I2510 Atherosclerotic heart disease of native coronary artery without angina pectoris: Secondary | ICD-10-CM

## 2016-03-19 DIAGNOSIS — E785 Hyperlipidemia, unspecified: Secondary | ICD-10-CM

## 2016-03-19 DIAGNOSIS — I10 Essential (primary) hypertension: Secondary | ICD-10-CM | POA: Diagnosis not present

## 2016-03-19 NOTE — Patient Instructions (Signed)
Your physician wants you to follow-up in: 6 MONTHS WITH DR CRENSHAW You will receive a reminder letter in the mail two months in advance. If you don't receive a letter, please call our office to schedule the follow-up appointment.   If you need a refill on your cardiac medications before your next appointment, please call your pharmacy.  

## 2016-05-01 ENCOUNTER — Other Ambulatory Visit: Payer: Self-pay | Admitting: Family Medicine

## 2016-05-02 NOTE — Telephone Encounter (Signed)
Pt was due for 6 month f/u this month per Dr. Marliss Coots last OV note, called pt and schedule the f/u but wife advise me that pt has enough med to last until his f/u appt so Rx declined

## 2016-05-17 ENCOUNTER — Other Ambulatory Visit: Payer: Self-pay | Admitting: Cardiology

## 2016-05-17 NOTE — Telephone Encounter (Signed)
Rx(s) sent to pharmacy electronically.  

## 2016-05-20 ENCOUNTER — Telehealth: Payer: Self-pay | Admitting: Family Medicine

## 2016-05-20 DIAGNOSIS — I10 Essential (primary) hypertension: Secondary | ICD-10-CM

## 2016-05-20 DIAGNOSIS — E538 Deficiency of other specified B group vitamins: Secondary | ICD-10-CM

## 2016-05-20 DIAGNOSIS — E7800 Pure hypercholesterolemia, unspecified: Secondary | ICD-10-CM

## 2016-05-20 DIAGNOSIS — IMO0001 Reserved for inherently not codable concepts without codable children: Secondary | ICD-10-CM

## 2016-05-20 DIAGNOSIS — E1165 Type 2 diabetes mellitus with hyperglycemia: Secondary | ICD-10-CM

## 2016-05-20 NOTE — Telephone Encounter (Signed)
-----   Message from Marchia Bond sent at 05/17/2016  2:48 PM EST ----- Regarding: F/u labs Mon 11/27, need orders. Thanks! :-) Please order  future f/u labs for pt's upcoming lab appt. Thanks Aniceto Boss

## 2016-05-28 ENCOUNTER — Other Ambulatory Visit (INDEPENDENT_AMBULATORY_CARE_PROVIDER_SITE_OTHER): Payer: Commercial Managed Care - HMO

## 2016-05-28 DIAGNOSIS — E1165 Type 2 diabetes mellitus with hyperglycemia: Secondary | ICD-10-CM | POA: Diagnosis not present

## 2016-05-28 DIAGNOSIS — E7800 Pure hypercholesterolemia, unspecified: Secondary | ICD-10-CM | POA: Diagnosis not present

## 2016-05-28 DIAGNOSIS — E538 Deficiency of other specified B group vitamins: Secondary | ICD-10-CM

## 2016-05-28 DIAGNOSIS — I10 Essential (primary) hypertension: Secondary | ICD-10-CM | POA: Diagnosis not present

## 2016-05-28 DIAGNOSIS — IMO0001 Reserved for inherently not codable concepts without codable children: Secondary | ICD-10-CM

## 2016-05-28 LAB — COMPREHENSIVE METABOLIC PANEL
ALK PHOS: 79 U/L (ref 39–117)
ALT: 19 U/L (ref 0–53)
AST: 13 U/L (ref 0–37)
Albumin: 4 g/dL (ref 3.5–5.2)
BUN: 23 mg/dL (ref 6–23)
CHLORIDE: 107 meq/L (ref 96–112)
CO2: 28 meq/L (ref 19–32)
Calcium: 9.4 mg/dL (ref 8.4–10.5)
Creatinine, Ser: 1.21 mg/dL (ref 0.40–1.50)
GFR: 61.29 mL/min (ref >60.00)
GLUCOSE: 177 mg/dL — AB (ref 70–99)
POTASSIUM: 4.2 meq/L (ref 3.5–5.1)
SODIUM: 142 meq/L (ref 135–145)
Total Bilirubin: 0.5 mg/dL (ref 0.2–1.2)
Total Protein: 6.8 g/dL (ref 6.0–8.3)

## 2016-05-28 LAB — HEMOGLOBIN A1C: Hgb A1c MFr Bld: 7.4 % — ABNORMAL HIGH (ref 4.6–6.5)

## 2016-05-28 LAB — LIPID PANEL
CHOLESTEROL: 135 mg/dL (ref 0–200)
HDL: 35.5 mg/dL — ABNORMAL LOW (ref >39.00)
LDL Cholesterol: 82 mg/dL (ref 0–99)
NONHDL: 99.83
Total CHOL/HDL Ratio: 4
Triglycerides: 91 mg/dL (ref 0.0–149.0)
VLDL: 18.2 mg/dL (ref 0.0–40.0)

## 2016-05-28 LAB — VITAMIN B12: VITAMIN B 12: 543 pg/mL (ref 211–911)

## 2016-05-29 ENCOUNTER — Ambulatory Visit (INDEPENDENT_AMBULATORY_CARE_PROVIDER_SITE_OTHER): Payer: Commercial Managed Care - HMO | Admitting: Family Medicine

## 2016-05-29 ENCOUNTER — Encounter: Payer: Self-pay | Admitting: Family Medicine

## 2016-05-29 VITALS — BP 132/66 | HR 61 | Temp 98.5°F | Ht 71.0 in | Wt 252.8 lb

## 2016-05-29 DIAGNOSIS — E6609 Other obesity due to excess calories: Secondary | ICD-10-CM

## 2016-05-29 DIAGNOSIS — I10 Essential (primary) hypertension: Secondary | ICD-10-CM

## 2016-05-29 DIAGNOSIS — E7800 Pure hypercholesterolemia, unspecified: Secondary | ICD-10-CM

## 2016-05-29 DIAGNOSIS — Z23 Encounter for immunization: Secondary | ICD-10-CM | POA: Diagnosis not present

## 2016-05-29 DIAGNOSIS — E1165 Type 2 diabetes mellitus with hyperglycemia: Secondary | ICD-10-CM | POA: Diagnosis not present

## 2016-05-29 DIAGNOSIS — Z6835 Body mass index (BMI) 35.0-35.9, adult: Secondary | ICD-10-CM

## 2016-05-29 DIAGNOSIS — E538 Deficiency of other specified B group vitamins: Secondary | ICD-10-CM

## 2016-05-29 DIAGNOSIS — IMO0001 Reserved for inherently not codable concepts without codable children: Secondary | ICD-10-CM

## 2016-05-29 NOTE — Patient Instructions (Addendum)
You are due for an eye exam next month by our records- please remember to schedule that  Try to get back on track with diet and exercise - cut out the sweets and corn bread/ice cream  Watch portions- especially with carbohydrates  Your meals should have more green vegetables /low calorie items  If you are unable to get your glucose down with lifestyle change then we can increase your lantus -please let me know  Stick with the B12 pills without shots  Follow up in 3 months for annual exam

## 2016-05-29 NOTE — Progress Notes (Signed)
Subjective:    Patient ID: Ryan Garcia, male    DOB: 02-Jul-1934, 82 y.o.   MRN: [id number]  HPI Here for f/u of chronic health problems  He declines a flu shot since he has uri today - perhaps allergies  Nasal cong/prod cough  Otherwise doing ok   Wt Readings from Last 3 Encounters:  05/29/16 252 lb 12 oz (114.6 kg)  03/19/16 252 lb (114.3 kg)  11/22/15 246 lb (111.6 kg)  wt is stable  bmi is 35.2  bp is stable today  No cp or palpitations or headaches or edema  No side effects to medicines  BP Readings from Last 3 Encounters:  05/29/16 132/66  03/19/16 134/64  11/22/15 128/66     Diabetes Home sugar results -higher / 200s  DM diet - has "lack of self control" - not a lot of motivation  Portions are too big Also eating ice cream (has to have it in the house due to grand kids) Exercise -still moves around a lot -works out in his barn and burns wood  Symptoms-none (not too thirsty)  A1C last  Lab Results  Component Value Date   HGBA1C 7.4 (H) 05/28/2016  this is up from 6.6  No problems with medications -glipizide and metformin and lantus 30 u  Renal protection ace  Last eye exam  12/16   Hx of hyperlipidemia Lab Results  Component Value Date   CHOL 135 05/28/2016   CHOL 125 11/18/2015   CHOL 132 08/19/2015   Lab Results  Component Value Date   HDL 35.50 (L) 05/28/2016   HDL 28.00 (L) 11/18/2015   HDL 32.40 (L) 08/19/2015   Lab Results  Component Value Date   LDLCALC 82 05/28/2016   LDLCALC 76 11/18/2015   LDLCALC 64 08/19/2015   Lab Results  Component Value Date   TRIG 91.0 05/28/2016   TRIG 101.0 11/18/2015   TRIG 178.0 (H) 08/19/2015   Lab Results  Component Value Date   CHOLHDL 4 05/28/2016   CHOLHDL 4 11/18/2015   CHOLHDL 4 08/19/2015   Lab Results  Component Value Date   LDLDIRECT 78.5 11/17/2012   LDLDIRECT 156.5 05/09/2010   LDLDIRECT 152.9 08/09/2009   On pravastatin and diet  Fairly stable   Patient Active Problem List   Diagnosis Date Noted  . B12 deficiency 11/22/2015  . Routine general medical examination at a health care facility 08/26/2015  . Dyspnea   . Abnormal nuclear cardiac imaging test   . SOB (shortness of breath) on exertion 11/27/2013  . Encounter for Medicare annual wellness exam 05/29/2013  . Prostate cancer screening 07/15/2012  . Abnormal chest x-ray 05/24/2011  . CAD (coronary artery disease) 05/17/2011  . Chest pain 04/30/2011  . Nonspecific abnormal results of cardiovascular function study 04/30/2011  . Arm pain, left 04/14/2011  . Upper back pain, chronic 04/06/2011  . Abnormal EKG 04/06/2011  . Left arm pain 04/06/2011  . BACK PAIN 01/13/2010  . Diabetes type 2, uncontrolled ([place]) 10/04/2006  . HYPERCHOLESTEROLEMIA 10/04/2006  . Obesity 10/04/2006  . ERECTILE DYSFUNCTION 10/04/2006  . Essential hypertension 10/04/2006  . ALLERGIC RHINITIS 10/04/2006  . GERD 10/04/2006  . OSTEOARTHRITIS 10/04/2006  . SLEEP APNEA 10/04/2006  . EDEMA 10/04/2006  . SKIN CANCER, HX OF 10/04/2006   Past Medical History:  Diagnosis Date  . AC (acromioclavicular) joint bone spurs    lt ankle  . Allergy    allergic rhinitis  . Arthritis    OA  .  BPH (benign prostatic hyperplasia)    microwave tx of prostate  . CAD (coronary artery disease)    Myoview 04/16/11: mild apical ischemia, study not gated.    . Chronic upper back pain   . Diabetes mellitus    type II x8 years  . GERD (gastroesophageal reflux disease)   . HLD (hyperlipidemia)    10/12: TC 208, TG 213, HDL 36, LDL 129  . Hx of skin cancer, basal cell    many skin cancers removed/under constant treatment.  . Hypertension   . Obstructive sleep apnea on CPAP   . Scoliosis    Past Surgical History:  Procedure Laterality Date  . BREAST SURGERY  2009   breast lump removed benign  . CARDIAC CATHETERIZATION N/A 03/15/2015   Procedure: Left Heart Cath and Coronary Angiography;  Surgeon: Belva Crome, MD;  Location: [facility];  Service: Cardiovascular;  Laterality: N/A;   Social History  Substance Use Topics  . Smoking status: Never Smoker  . Smokeless tobacco: Never Used  . Alcohol use No   Family History  Problem Relation Age of Onset  . Heart attack Brother   . Colon cancer Neg Hx    Allergies  Allergen Reactions  . Loratadine     REACTION: not effective  . Simvastatin     REACTION: intolerant   Current Outpatient Prescriptions on File Prior to Visit  Medication Sig Dispense Refill  . aspirin 81 MG tablet Take 81 mg by mouth daily.      . Blood Glucose Monitoring Suppl (TRUE METRIX AIR GLUCOSE METER) W/DEVICE KIT 1 kit by Other route 2 (two) times daily. Check blood sugar twice daily and as directed. Dx E11.65 1 kit 0  . cetirizine (ZYRTEC) 10 MG tablet Take 1 tablet (10 mg total) by mouth daily. 90 tablet 1  . Coenzyme Q10 (CO Q 10 PO) Take 1 tablet by mouth 2 (two) times daily.    . Cyanocobalamin (VITAMIN B12 PO) Take 1 tablet by mouth daily.    . fluorouracil (EFUDEX) 5 % cream Apply 2 application topically daily as needed.     Marland Kitchen glipiZIDE (GLUCOTROL) 10 MG tablet TAKE 1 TABLET TWICE DAILY WITH MEALS 180 tablet 1  . hydrochlorothiazide (HYDRODIURIL) 12.5 MG tablet TAKE 1 TABLET EVERY DAY 90 tablet 3  . Insulin Pen Needle (PEN NEEDLES) 31G X 6 MM MISC Use pen needles when taking Lantus at bedtime. Dx E11.65 100 each 3  . nitroGLYCERIN (NITROSTAT) 0.4 MG SL tablet Place 1 tablet (0.4 mg total) under the tongue every 5 (five) minutes as needed for chest pain. 25 tablet 0  . pravastatin (PRAVACHOL) 80 MG tablet TAKE 1 TABLET EVERY EVENING 90 tablet 3  . triamcinolone (NASACORT) 55 MCG/ACT AERO nasal inhaler USE ONE SPRAYS EACH NOSTRIL EVERY DAY    . TRUEPLUS LANCETS 30G MISC Check blood sugar twice daily and as directed. Dx E11.65 200 each 1   No current facility-administered medications on file prior to visit.     Review of Systems Review of Systems  Constitutional: Negative for fever,  appetite change, fatigue and unexpected weight change.  Eyes: Negative for pain and visual disturbance.  Respiratory: Negative for cough and shortness of breath.   Cardiovascular: Negative for cp or palpitations    Gastrointestinal: Negative for nausea, diarrhea and constipation.  Genitourinary: Negative for urgency and frequency. neg for excessive thirst Skin: Negative for pallor or rash   Neurological: Negative for weakness, light-headedness, numbness and  headaches.  Hematological: Negative for adenopathy. Does not bruise/bleed easily.  Psychiatric/Behavioral: Negative for dysphoric mood. The patient is not nervous/anxious.         Objective:   Physical Exam  Constitutional: He appears well-developed and well-nourished. No distress.  obese and well appearing   HENT:  Head: Normocephalic and atraumatic.  Mouth/Throat: Oropharynx is clear and moist.  Eyes: Conjunctivae and EOM are normal. Pupils are equal, round, and reactive to light.  Neck: Normal range of motion. Neck supple. No JVD present. Carotid bruit is not present. No thyromegaly present.  Cardiovascular: Normal rate, regular rhythm, normal heart sounds and intact distal pulses.  Exam reveals no gallop.   Pulmonary/Chest: Effort normal and breath sounds normal. No respiratory distress. He has no wheezes. He has no rales.  No crackles  Abdominal: Soft. Bowel sounds are normal. He exhibits no distension, no abdominal bruit and no mass. There is no tenderness.  Musculoskeletal: He exhibits no edema.  Lymphadenopathy:    He has no cervical adenopathy.  Neurological: He is alert. He has normal reflexes.  Skin: Skin is warm and dry. No rash noted. No pallor.  Psychiatric: He has a normal mood and affect.          Assessment & Plan:   Problem List Items Addressed This Visit      Cardiovascular and Mediastinum   Essential hypertension - Primary    bp in fair control at this time  BP Readings from Last 1 Encounters:    05/29/16 132/66   No changes needed Disc lifstyle change with low sodium diet and exercise  Labs reviewed  Wt loss encouraged        Endocrine   Diabetes type 2, uncontrolled ([place])    Lab Results  Component Value Date   HGBA1C 7.4 (H) 05/28/2016   Worse control due to dietary indiscretion  Not very motivated for change but also does not want to inc lantus yet Enc exercise and wt loss  Enc low glycemic diet  If no improvement -he will alert [place] to go ahead and inc his lantus  F/u 3 mo  He will schedule eye exam due in dec         Other   Obesity    Discussed how this problem influences overall health and the risks it imposes  Reviewed plan for weight loss with lower calorie diet (via better food choices and also portion control or program like weight watchers) and exercise building up to or more than 30 minutes 5 days per week including some aerobic activity         HYPERCHOLESTEROLEMIA    Fair control on pravastatin and diet  Enc exercise to inc his HDL  Disc goals for lipids and reasons to control them Rev labs with pt Rev low sat fat diet in detail       B12 deficiency    Lab Results  Component Value Date   VITAMINB12 543 05/28/2016   Improved with current supplementation        Other Visit Diagnoses    Need for influenza vaccination       Relevant Orders   Flu Vaccine QUAD 36+ mos IM (Completed)

## 2016-05-29 NOTE — Progress Notes (Signed)
Pre visit review using our clinic review tool, if applicable. No additional management support is needed unless otherwise documented below in the visit note. 

## 2016-05-30 ENCOUNTER — Other Ambulatory Visit: Payer: Self-pay | Admitting: Cardiology

## 2016-05-30 ENCOUNTER — Other Ambulatory Visit: Payer: Self-pay | Admitting: Family Medicine

## 2016-05-31 NOTE — Assessment & Plan Note (Signed)
Lab Results  Component Value Date   [id number] 05/28/2016   Improved with current supplementation

## 2016-05-31 NOTE — Assessment & Plan Note (Signed)
bp in fair control at this time  BP Readings from Last 1 Encounters:  05/29/16 132/66   No changes needed Disc lifstyle change with low sodium diet and exercise  Labs reviewed  Wt loss encouraged

## 2016-05-31 NOTE — Assessment & Plan Note (Signed)
Discussed how this problem influences overall health and the risks it imposes  Reviewed plan for weight loss with lower calorie diet (via better food choices and also portion control or program like weight watchers) and exercise building up to or more than 30 minutes 5 days per week including some aerobic activity    

## 2016-05-31 NOTE — Assessment & Plan Note (Signed)
Lab Results  Component Value Date   HGBA1C 7.4 (H) 05/28/2016   Worse control due to dietary indiscretion  Not very motivated for change but also does not want to inc lantus yet Enc exercise and wt loss  Enc low glycemic diet  If no improvement -he will alert [place] to go ahead and inc his lantus  F/u 3 mo  He will schedule eye exam due in dec

## 2016-05-31 NOTE — Assessment & Plan Note (Signed)
Fair control on pravastatin and diet  Enc exercise to inc his HDL  Disc goals for lipids and reasons to control them Rev labs with pt Rev low sat fat diet in detail

## 2016-06-11 DIAGNOSIS — Z872 Personal history of diseases of the skin and subcutaneous tissue: Secondary | ICD-10-CM | POA: Diagnosis not present

## 2016-06-11 DIAGNOSIS — L821 Other seborrheic keratosis: Secondary | ICD-10-CM | POA: Diagnosis not present

## 2016-06-11 DIAGNOSIS — Z08 Encounter for follow-up examination after completed treatment for malignant neoplasm: Secondary | ICD-10-CM | POA: Diagnosis not present

## 2016-06-11 DIAGNOSIS — D485 Neoplasm of uncertain behavior of skin: Secondary | ICD-10-CM | POA: Diagnosis not present

## 2016-06-11 DIAGNOSIS — C44329 Squamous cell carcinoma of skin of other parts of face: Secondary | ICD-10-CM | POA: Diagnosis not present

## 2016-06-11 DIAGNOSIS — L570 Actinic keratosis: Secondary | ICD-10-CM | POA: Diagnosis not present

## 2016-06-11 DIAGNOSIS — X32XXXA Exposure to sunlight, initial encounter: Secondary | ICD-10-CM | POA: Diagnosis not present

## 2016-06-11 DIAGNOSIS — Z85828 Personal history of other malignant neoplasm of skin: Secondary | ICD-10-CM | POA: Diagnosis not present

## 2016-06-15 DIAGNOSIS — H35372 Puckering of macula, left eye: Secondary | ICD-10-CM | POA: Diagnosis not present

## 2016-06-15 LAB — HM DIABETES EYE EXAM

## 2016-07-16 ENCOUNTER — Other Ambulatory Visit: Payer: Self-pay | Admitting: Family Medicine

## 2016-08-01 ENCOUNTER — Other Ambulatory Visit: Payer: Self-pay | Admitting: Family Medicine

## 2016-08-01 DIAGNOSIS — C44329 Squamous cell carcinoma of skin of other parts of face: Secondary | ICD-10-CM | POA: Diagnosis not present

## 2016-08-16 DIAGNOSIS — R0689 Other abnormalities of breathing: Secondary | ICD-10-CM | POA: Diagnosis not present

## 2016-08-29 ENCOUNTER — Other Ambulatory Visit: Payer: Self-pay | Admitting: Family Medicine

## 2016-09-04 ENCOUNTER — Telehealth: Payer: Self-pay | Admitting: Family Medicine

## 2016-09-04 DIAGNOSIS — E1165 Type 2 diabetes mellitus with hyperglycemia: Principal | ICD-10-CM

## 2016-09-04 DIAGNOSIS — IMO0001 Reserved for inherently not codable concepts without codable children: Secondary | ICD-10-CM

## 2016-09-04 DIAGNOSIS — Z0000 Encounter for general adult medical examination without abnormal findings: Secondary | ICD-10-CM

## 2016-09-04 DIAGNOSIS — N400 Enlarged prostate without lower urinary tract symptoms: Secondary | ICD-10-CM | POA: Insufficient documentation

## 2016-09-04 NOTE — Telephone Encounter (Signed)
-----   Message from Eustace Pen, LPN sent at [phone]  4:56 PM EST ----- Regarding: Labs 3/7 Please place lab orders, if any. Thank you.

## 2016-09-07 ENCOUNTER — Ambulatory Visit (INDEPENDENT_AMBULATORY_CARE_PROVIDER_SITE_OTHER): Payer: Medicare HMO

## 2016-09-07 VITALS — BP 120/80 | HR 64 | Temp 97.9°F | Ht 71.0 in | Wt 253.2 lb

## 2016-09-07 DIAGNOSIS — Z0000 Encounter for general adult medical examination without abnormal findings: Secondary | ICD-10-CM | POA: Diagnosis not present

## 2016-09-07 DIAGNOSIS — N400 Enlarged prostate without lower urinary tract symptoms: Secondary | ICD-10-CM | POA: Diagnosis not present

## 2016-09-07 DIAGNOSIS — E1165 Type 2 diabetes mellitus with hyperglycemia: Secondary | ICD-10-CM

## 2016-09-07 DIAGNOSIS — Z125 Encounter for screening for malignant neoplasm of prostate: Secondary | ICD-10-CM

## 2016-09-07 DIAGNOSIS — IMO0001 Reserved for inherently not codable concepts without codable children: Secondary | ICD-10-CM

## 2016-09-07 LAB — LIPID PANEL
Cholesterol: 139 mg/dL (ref 0–200)
HDL: 35.6 mg/dL — ABNORMAL LOW (ref >39.00)
LDL CALC: 86 mg/dL (ref 0–99)
NONHDL: 103.39
Total CHOL/HDL Ratio: 4
Triglycerides: 88 mg/dL (ref 0.0–149.0)
VLDL: 17.6 mg/dL (ref 0.0–40.0)

## 2016-09-07 LAB — COMPREHENSIVE METABOLIC PANEL
ALT: 22 U/L (ref 0–53)
AST: 15 U/L (ref 0–37)
Albumin: 4.1 g/dL (ref 3.5–5.2)
Alkaline Phosphatase: 85 U/L (ref 39–117)
BILIRUBIN TOTAL: 0.5 mg/dL (ref 0.2–1.2)
BUN: 21 mg/dL (ref 6–23)
CHLORIDE: 108 meq/L (ref 96–112)
CO2: 28 meq/L (ref 19–32)
CREATININE: 1.21 mg/dL (ref 0.40–1.50)
Calcium: 9.1 mg/dL (ref 8.4–10.5)
GFR: 61.24 mL/min (ref >60.00)
Glucose, Bld: 205 mg/dL — ABNORMAL HIGH (ref 70–99)
Potassium: 4.3 mEq/L (ref 3.5–5.1)
SODIUM: 143 meq/L (ref 135–145)
Total Protein: 6.6 g/dL (ref 6.0–8.3)

## 2016-09-07 LAB — CBC WITH DIFFERENTIAL/PLATELET
BASOS ABS: 0 10*3/uL (ref 0.0–0.1)
Basophils Relative: 0.6 % (ref 0.0–3.0)
EOS ABS: 0.5 10*3/uL (ref 0.0–0.7)
Eosinophils Relative: 6.6 % — ABNORMAL HIGH (ref 0.0–5.0)
HEMATOCRIT: 41.1 % (ref 39.0–52.0)
HEMOGLOBIN: 13.8 g/dL (ref 13.0–17.0)
LYMPHS PCT: 23.9 % (ref 12.0–46.0)
Lymphs Abs: 1.7 10*3/uL (ref 0.7–4.0)
MCHC: 33.5 g/dL (ref 30.0–36.0)
MCV: 92.2 fl (ref 78.0–100.0)
MONO ABS: 0.8 10*3/uL (ref 0.1–1.0)
Monocytes Relative: 10.6 % (ref 3.0–12.0)
Neutro Abs: 4.1 10*3/uL (ref 1.4–7.7)
Neutrophils Relative %: 58.3 % (ref 43.0–77.0)
PLATELETS: 175 10*3/uL (ref 150.0–400.0)
RBC: 4.45 Mil/uL (ref 4.22–5.81)
RDW: 15.1 % (ref 11.5–15.5)
WBC: 7.1 10*3/uL (ref 4.0–10.5)

## 2016-09-07 LAB — HEMOGLOBIN A1C: Hgb A1c MFr Bld: 7.6 % — ABNORMAL HIGH (ref 4.6–6.5)

## 2016-09-07 LAB — TSH: TSH: 4.25 u[IU]/mL (ref 0.35–4.50)

## 2016-09-07 LAB — PSA, MEDICARE: PSA: 2.25 ng/ml (ref 0.10–4.00)

## 2016-09-07 NOTE — Progress Notes (Signed)
Subjective:   Ryan Garcia is a 83 y.o. male who presents for Medicare Annual/Subsequent preventive examination.  Review of Systems:  N/A Cardiac Risk Factors include: advanced age (>56mn, >>41women);obesity (BMI >30kg/m2);male gender;diabetes mellitus;dyslipidemia;hypertension     Objective:    Vitals: BP 120/80 (BP Location: Right Arm, Patient Position: Sitting, Cuff Size: Large)   Pulse 64   Temp 97.9 F (36.6 C) (Oral)   Ht 5' 11"  (1.803 m) Comment: no shoes  Wt 253 lb 4 oz (114.9 kg)   SpO2 98%   BMI 35.32 kg/m   Body mass index is 35.32 kg/m.  Tobacco History  Smoking Status  . Never Smoker  Smokeless Tobacco  . Never Used     Counseling given: No   Past Medical History:  Diagnosis Date  . AC (acromioclavicular) joint bone spurs    lt ankle  . Allergy    allergic rhinitis  . Arthritis    OA  . BPH (benign prostatic hyperplasia)    microwave tx of prostate  . CAD (coronary artery disease)    Myoview 04/16/11: mild apical ischemia, study not gated.    . Chronic upper back pain   . Diabetes mellitus    type II x8 years  . GERD (gastroesophageal reflux disease)   . HLD (hyperlipidemia)    10/12: TC 208, TG 213, HDL 36, LDL 129  . Hx of skin cancer, basal cell    many skin cancers removed/under constant treatment.  . Hypertension   . Obstructive sleep apnea on CPAP   . Scoliosis    Past Surgical History:  Procedure Laterality Date  . BREAST SURGERY  2009   breast lump removed benign  . CARDIAC CATHETERIZATION N/A 03/15/2015   Procedure: Left Heart Cath and Coronary Angiography;  Surgeon: HBelva Crome MD;  Location: [facility];  Service: Cardiovascular;  Laterality: N/A;   Family History  Problem Relation Age of Onset  . Heart attack Brother   . Colon cancer Neg Hx    History  Sexual Activity  . Sexual activity: Not on file    Outpatient Encounter Prescriptions as of 09/07/2016  Medication Sig  . aspirin 81 MG tablet Take 81 mg by  mouth daily.    . Blood Glucose Monitoring Suppl (TRUE METRIX AIR GLUCOSE METER) W/DEVICE KIT 1 kit by Other route 2 (two) times daily. Check blood sugar twice daily and as directed. Dx E11.65  . cetirizine (ZYRTEC) 10 MG tablet Take 1 tablet (10 mg total) by mouth daily. (Patient taking differently: Take 10 mg by mouth as needed. )  . Coenzyme Q10 (CO Q 10 PO) Take 1 tablet by mouth daily.   . Cyanocobalamin (VITAMIN B12 PO) Take 1 tablet by mouth daily.  . fluorouracil (EFUDEX) 5 % cream Apply 2 application topically daily as needed.   .Marland KitchenglipiZIDE (GLUCOTROL) 10 MG tablet TAKE 1 TABLET TWICE DAILY WITH MEALS  . glucose blood (TRUE METRIX BLOOD GLUCOSE TEST) test strip CHECK BLOOD SUGAR TWICE DAILY  AND AS DIRECTED (DX. E11.65)  . hydrochlorothiazide (HYDRODIURIL) 12.5 MG tablet TAKE 1 TABLET EVERY DAY  . LANTUS SOLOSTAR 100 UNIT/ML Solostar Pen INJECT  30 UNITS SUBCUTANEOUSLY AT BEDTIME  . lisinopril (PRINIVIL,ZESTRIL) 10 MG tablet TAKE 1 TABLET (10 MG TOTAL) BY MOUTH DAILY. PLEASE CONTACT OFFICE FOR ADDITIONAL REFILLS  . metFORMIN (GLUCOPHAGE) 1000 MG tablet TAKE 1 TABLET TWICE DAILY  . nitroGLYCERIN (NITROSTAT) 0.4 MG SL tablet Place 1 tablet (0.4 mg total)  under the tongue every 5 (five) minutes as needed for chest pain.  . pravastatin (PRAVACHOL) 80 MG tablet TAKE 1 TABLET EVERY EVENING  . triamcinolone (NASACORT) 55 MCG/ACT AERO nasal inhaler USE ONE SPRAYS EACH NOSTRIL EVERY DAY as needed  . TRUEPLUS LANCETS 30G MISC Check blood sugar twice daily and as directed. Dx E11.65  . UNIFINE PENTIPS 31G X 6 MM MISC USE PEN NEEDLES WHEN TAKING LANTUS AT BEDTIME   No facility-administered encounter medications on file as of 09/07/2016.     Activities of Daily Living In your present state of health, do you have any difficulty performing the following activities: 09/07/2016  Hearing? Y  Vision? Y  Difficulty concentrating or making decisions? N  Walking or climbing stairs? N  Dressing or bathing?  N  Doing errands, shopping? N  Preparing Food and eating ? N  Using the Toilet? N  In the past six months, have you accidently leaked urine? Y  Do you have problems with loss of bowel control? N  Managing your Medications? N  Managing your Finances? N  Housekeeping or managing your Housekeeping? N  Some recent data might be hidden    Patient Care Team: Abner Greenspan, MD as PCP - General Leandrew Koyanagi, MD as Referring Physician (Ophthalmology) Lelon Perla, MD as Consulting Physician (Cardiology) Kennieth Francois, MD as Consulting Physician (Dermatology) Salomon Fick, MD as Referring Physician (Dentistry)   Assessment:    Hearing Screening Comments: Bilateral hearing aids Vision Screening Comments: Last vision exam in Feb 2018 with Dr. Wallace Going  Exercise Activities and Dietary recommendations Current Exercise Habits: The patient does not participate in regular exercise at present, Exercise limited by: None identified  Goals    . Increase water intake          Starting 09/07/2016, I will continue to drink at least 6-8 glasses of water daily.       Fall Risk Fall Risk  09/07/2016 08/26/2015 07/23/2012  Falls in the past year? No No No   Depression Screen PHQ 2/9 Scores 09/07/2016 08/26/2015 07/23/2012  PHQ - 2 Score 0 0 0    Cognitive Function MMSE - Mini Mental State Exam 09/07/2016  Orientation to time 5  Orientation to Place 5  Registration 3  Attention/ Calculation 0  Recall 3  Language- name 2 objects 0  Language- repeat 1  Language- follow 3 step command 3  Language- read & follow direction 0  Write a sentence 0  Copy design 0  Total score 20       PLEASE NOTE: A Mini-Cog screen was completed. Maximum score is 20. A value of 0 denotes this part of [place] MMSE was not completed or the patient failed this part of the Mini-Cog screening.   Mini-Cog Screening Orientation to Time - Max 5 pts Orientation to Place - Max 5 pts Registration - Max 3  pts Recall - Max 3 pts Language Repeat - Max 1 pts Language Follow 3 Step Command - Max 3 pts   Immunization History  Administered Date(s) Administered  . Influenza Split 07/09/2011  . Influenza Whole 06/01/2002, 05/22/2007  . Influenza, Seasonal, Injecte, Preservative Fre 07/23/2012  . Influenza,inj,Quad PF,36+ Mos 05/29/2013, 03/01/2014, 08/26/2015, 05/29/2016  . Pneumococcal Conjugate-13 10/04/2014  . Pneumococcal Polysaccharide-23 06/08/2002, 05/22/2007  . Td 01/22/2001  . Tdap 07/09/2011  . Zoster 11/21/2012   Screening Tests Health Maintenance  Topic Date Due  . HEMOGLOBIN A1C  03/10/2017  . FOOT EXAM  05/29/2017  .  OPHTHALMOLOGY EXAM  06/15/2017  . TETANUS/TDAP  07/08/2021  . INFLUENZA VACCINE  Completed  . PNA vac Low Risk Adult  Completed      Plan:     I have personally reviewed and addressed the Medicare Annual Wellness questionnaire and have noted the following in the patient's chart:  A. Medical and social history B. Use of alcohol, tobacco or illicit drugs  C. Current medications and supplements D. Functional ability and status E.  Nutritional status F.  Physical activity G. Advance directives H. List of other physicians I.  Hospitalizations, surgeries, and ER visits in previous 12 months J.  [place] to include hearing, vision, cognitive, depression L. Referrals and appointments - none  In addition, I have reviewed and discussed with patient certain preventive protocols, quality metrics, and best practice recommendations. A written personalized care plan for preventive services as well as general preventive health recommendations were provided to patient.  See attached scanned questionnaire for additional information.   Signed,   Lindell Noe, MHA, BS, LPN Health Coach

## 2016-09-07 NOTE — Patient Instructions (Addendum)
Ryan Garcia , Thank you for taking time to come for your Medicare Wellness Visit. I appreciate your ongoing commitment to your health goals. Please review the following plan we discussed and let me know if I can assist you in the future.   These are the goals we discussed: Goals    . Increase water intake          Starting 09/07/2016, I will continue to drink at least 6-8 glasses of water daily.        This is a list of the screening recommended for you and due dates:  Health Maintenance  Topic Date Due  . Hemoglobin A1C  03/10/2017  . Complete foot exam   05/29/2017  . Eye exam for diabetics  06/15/2017  . Tetanus Vaccine  07/08/2021  . Flu Shot  Completed  . Pneumonia vaccines  Completed     Preventive Care for Adults  A healthy lifestyle and preventive care can promote health and wellness. Preventive health guidelines for adults include the following key practices.  . A routine yearly physical is a good way to check with your health care provider about your health and preventive screening. It is a chance to share any concerns and updates on your health and to receive a thorough exam.  . Visit your dentist for a routine exam and preventive care every 6 months. Brush your teeth twice a day and floss once a day. Good oral hygiene prevents tooth decay and gum disease.  . The frequency of eye exams is based on your age, health, family medical history, use  of contact lenses, and other factors. Follow your health care provider's ecommendations for frequency of eye exams.  . Eat a healthy diet. Foods like vegetables, fruits, whole grains, low-fat dairy products, and lean protein foods contain the nutrients you need without too many calories. Decrease your intake of foods high in solid fats, added sugars, and salt. Eat the right amount of calories for you. Get information about a proper diet from your health care provider, if necessary.  . Regular physical exercise is one of the most  important things you can do for your health. Most adults should get at least 150 minutes of moderate-intensity exercise (any activity that increases your heart rate and causes you to sweat) each week. In addition, most adults need muscle-strengthening exercises on 2 or more days a week.  Silver Sneakers may be a benefit available to you. To determine eligibility, you may visit the website: www.silversneakers.com or contact program at 367-477-8080 Mon-Fri between 8AM-8PM.   . Maintain a healthy weight. The body mass index (BMI) is a screening tool to identify possible weight problems. It provides an estimate of body fat based on height and weight. Your health care provider can find your BMI and can help you achieve or maintain a healthy weight.   For adults 20 years and older: ? A BMI below 18.5 is considered underweight. ? A BMI of 18.5 to 24.9 is normal. ? A BMI of 25 to 29.9 is considered overweight. ? A BMI of 30 and above is considered obese.   . Maintain normal blood lipids and cholesterol levels by exercising and minimizing your intake of saturated fat. Eat a balanced diet with plenty of fruit and vegetables. Blood tests for lipids and cholesterol should begin at age 43 and be repeated every 5 years. If your lipid or cholesterol levels are high, you are over 50, or you are at high risk for heart  disease, you may need your cholesterol levels checked more frequently. Ongoing high lipid and cholesterol levels should be treated with medicines if diet and exercise are not working.  . If you smoke, find out from your health care provider how to quit. If you do not use tobacco, please do not start.  . If you choose to drink alcohol, please do not consume more than 2 drinks per day. One drink is considered to be 12 ounces (355 mL) of beer, 5 ounces (148 mL) of wine, or 1.5 ounces (44 mL) of liquor.  . If you are 42-3 years old, ask your health care provider if you should take aspirin to prevent  strokes.  . Use sunscreen. Apply sunscreen liberally and repeatedly throughout the day. You should seek shade when your shadow is shorter than you. Protect yourself by wearing long sleeves, pants, a wide-brimmed hat, and sunglasses year round, whenever you are outdoors.  . Once a month, do a whole body skin exam, using a mirror to look at the skin on your back. Tell your health care provider of new moles, moles that have irregular borders, moles that are larger than a pencil eraser, or moles that have changed in shape or color.

## 2016-09-07 NOTE — Progress Notes (Signed)
PCP notes:   Health maintenance:  A1C - completed  Abnormal screenings:   None  Patient concerns:   Recent onset of right shoulder pain. Pain scale: 2/10. Pain limits ROM.  Urinary Incontinence Short Questionnaire 1. How often do you leak urine? Never About once a week or less often Two or three times a week About once a day Several times a day All the time 2. How much urine do you normally leak (whether you wear protection or not)? None A small amount A moderate amount A large amount 3. Overall, how much does leaking urine interfere with your everyday life?  Please circle a number between 0(not at all) and 10(a great deal). Patient's response: 0 4. When does urine leak?  Please select all that apply to you. Never - urine does not leak Leaks before I can get to the toilet Leaks when I cough or sneeze Leaks when I am physically active/exercising Leaks when I am finished urinating and are dressed Leaks for no obvious reason Leaks all the time  Nurse concerns:  None  Next PCP appt:   09/26/16 @ 1130  I reviewed health advisor's note, was available for consultation, and agree with documentation and plan. Loura Pardon MD

## 2016-09-07 NOTE — Progress Notes (Signed)
Pre visit review using our clinic review tool, if applicable. No additional management support is needed unless otherwise documented below in the visit note. 

## 2016-09-14 ENCOUNTER — Encounter: Payer: Commercial Managed Care - HMO | Admitting: Family Medicine

## 2016-09-26 ENCOUNTER — Ambulatory Visit (INDEPENDENT_AMBULATORY_CARE_PROVIDER_SITE_OTHER): Payer: Medicare HMO | Admitting: Family Medicine

## 2016-09-26 ENCOUNTER — Encounter: Payer: Self-pay | Admitting: Family Medicine

## 2016-09-26 VITALS — BP 122/68 | HR 74 | Temp 98.2°F | Ht 71.0 in | Wt 257.2 lb

## 2016-09-26 DIAGNOSIS — E1165 Type 2 diabetes mellitus with hyperglycemia: Secondary | ICD-10-CM

## 2016-09-26 DIAGNOSIS — Z6835 Body mass index (BMI) 35.0-35.9, adult: Secondary | ICD-10-CM

## 2016-09-26 DIAGNOSIS — E538 Deficiency of other specified B group vitamins: Secondary | ICD-10-CM | POA: Diagnosis not present

## 2016-09-26 DIAGNOSIS — I10 Essential (primary) hypertension: Secondary | ICD-10-CM | POA: Diagnosis not present

## 2016-09-26 DIAGNOSIS — E6609 Other obesity due to excess calories: Secondary | ICD-10-CM

## 2016-09-26 DIAGNOSIS — E7800 Pure hypercholesterolemia, unspecified: Secondary | ICD-10-CM

## 2016-09-26 DIAGNOSIS — N400 Enlarged prostate without lower urinary tract symptoms: Secondary | ICD-10-CM

## 2016-09-26 DIAGNOSIS — G4733 Obstructive sleep apnea (adult) (pediatric): Secondary | ICD-10-CM | POA: Diagnosis not present

## 2016-09-26 DIAGNOSIS — Z0000 Encounter for general adult medical examination without abnormal findings: Secondary | ICD-10-CM | POA: Diagnosis not present

## 2016-09-26 DIAGNOSIS — IMO0001 Reserved for inherently not codable concepts without codable children: Secondary | ICD-10-CM

## 2016-09-26 MED ORDER — INSULIN GLARGINE 100 UNIT/ML SOLOSTAR PEN: PEN_INJECTOR | SUBCUTANEOUS | 3 refills | Status: DC

## 2016-09-26 MED ORDER — METFORMIN HCL 1000 MG PO TABS: 1000.0000 mg | ORAL_TABLET | Freq: Two times a day (BID) | ORAL | 3 refills | Status: DC

## 2016-09-26 NOTE — Patient Instructions (Addendum)
Increase lantus to 40 units daily  If you get low blood glucose levels (the point where you feel bad)   then go to 35 and call and let us know   Stop at check out for referral to pulmonary for sleep apnea   Take care of yourself  We will sign you up for the cologuard colon screening program   If you feel motivated-try to get more exercise and eat better for health   Let your cardiologist know that your shortness of breath is worse   Follow up in 6 months with labs prior

## 2016-09-26 NOTE — Progress Notes (Signed)
Subjective:    Patient ID: Ryan Garcia, male    DOB: [birth year], 83 y.o.   MRN: [id number]  HPI Here for health maintenance exam and to review chronic medical problems    Feeling about the same    Wt Readings from Last 3 Encounters:  09/26/16 257 lb 4 oz (116.7 kg)  09/07/16 253 lb 4 oz (114.9 kg)  05/29/16 252 lb 12 oz (114.6 kg)  daily activity without extra exercise  Diet - pretty good / but does not stick to dm diet (not his priority)  bmi 35.8  Had AMW visit 3/9  Noted R shoulder pain 2/10 No other concerns He has bilateral hearing aides opth was 2/18  utd vaccines incl zoster vaccine   colnonoscopy 2/12- adenomatous polyps - he was scheduled for one and then they saw his heart problems hx and decided not to do it   Prostate health-hx of BPH Nocturia- does not wake up until 6 am  Frequency-every 2 hours/ about the same  Does not want to see urology yet -has been treated in the past  He dribbles a little  Lab Results  Component Value Date   PSA 2.25 09/07/2016   PSA 2.12 08/19/2015   PSA 1.87 05/25/2013   bp is stable today  No cp or palpitations or headaches or edema  No side effects to medicines  BP Readings from Last 3 Encounters:  09/26/16 122/68  09/07/16 120/80  05/29/16 132/66     DM2 Lab Results  Component Value Date   HGBA1C 7.6 (H) 09/07/2016  up from 7.4 He is ok with inc in lantus since he  He feels bad if glucose is under 100   Is on cpap for OSA  20 years ago started it - and re tested about 10 y ago he thinks  Machine is old /needs new equip    Hx of hyperlipidemia in setting of CAD Lab Results  Component Value Date   CHOL 139 09/07/2016   CHOL 135 05/28/2016   CHOL 125 11/18/2015   Lab Results  Component Value Date   HDL 35.60 (L) 09/07/2016   HDL 35.50 (L) 05/28/2016   HDL 28.00 (L) 11/18/2015   Lab Results  Component Value Date   LDLCALC 86 09/07/2016   LDLCALC 82 05/28/2016   LDLCALC 76 11/18/2015   Lab  Results  Component Value Date   TRIG 88.0 09/07/2016   TRIG 91.0 05/28/2016   TRIG 101.0 11/18/2015   Lab Results  Component Value Date   CHOLHDL 4 09/07/2016   CHOLHDL 4 05/28/2016   CHOLHDL 4 11/18/2015   Lab Results  Component Value Date   LDLDIRECT 78.5 11/17/2012   LDLDIRECT 156.5 05/09/2010   LDLDIRECT 152.9 08/09/2009   Pravastatin and diet  Diet is not optimal  No missed doses Overall stable HDL is low- he refuses to exercise   Hx of B12 def Lab Results  Component Value Date   VITAMINB12 543 05/28/2016  continues to take B12 every am     Chemistry      Component Value Date/Time   NA 143 09/07/2016 1002   K 4.3 09/07/2016 1002   CL 108 09/07/2016 1002   CO2 28 09/07/2016 1002   BUN 21 09/07/2016 1002   CREATININE 1.21 09/07/2016 1002      Component Value Date/Time   CALCIUM 9.1 09/07/2016 1002   ALKPHOS 85 09/07/2016 1002   AST 15 09/07/2016 1002   ALT 22 09/07/2016 1002  BILITOT 0.5 09/07/2016 1002     Lab Results  Component Value Date   WBC 7.1 09/07/2016   HGB 13.8 09/07/2016   HCT 41.1 09/07/2016   MCV 92.2 09/07/2016   PLT 175.0 09/07/2016   Lab Results  Component Value Date   TSH 4.25 09/07/2016    Patient Active Problem List   Diagnosis Date Noted  . BPH (benign prostatic hyperplasia) 09/04/2016  . B12 deficiency 11/22/2015  . Routine general medical examination at a health care facility 08/26/2015  . Abnormal nuclear cardiac imaging test   . SOB (shortness of breath) on exertion 11/27/2013  . Encounter for Medicare annual wellness exam 05/29/2013  . Prostate cancer screening 07/15/2012  . Abnormal chest x-ray 05/24/2011  . CAD (coronary artery disease) 05/17/2011  . Nonspecific abnormal results of cardiovascular function study 04/30/2011  . Upper back pain, chronic 04/06/2011  . Abnormal EKG 04/06/2011  . Left arm pain 04/06/2011  . BACK PAIN 01/13/2010  . Diabetes type 2, uncontrolled ([place]) 10/04/2006  .  HYPERCHOLESTEROLEMIA 10/04/2006  . Obesity 10/04/2006  . ERECTILE DYSFUNCTION 10/04/2006  . Essential hypertension 10/04/2006  . ALLERGIC RHINITIS 10/04/2006  . GERD 10/04/2006  . OSTEOARTHRITIS 10/04/2006  . Sleep apnea 10/04/2006  . EDEMA 10/04/2006  . SKIN CANCER, HX OF 10/04/2006   Past Medical History:  Diagnosis Date  . AC (acromioclavicular) joint bone spurs    lt ankle  . Allergy    allergic rhinitis  . Arthritis    OA  . BPH (benign prostatic hyperplasia)    microwave tx of prostate  . CAD (coronary artery disease)    Myoview 04/16/11: mild apical ischemia, study not gated.    . Chronic upper back pain   . Diabetes mellitus    type II x8 years  . GERD (gastroesophageal reflux disease)   . HLD (hyperlipidemia)    10/12: TC 208, TG 213, HDL 36, LDL 129  . Hx of skin cancer, basal cell    many skin cancers removed/under constant treatment.  . Hypertension   . Obstructive sleep apnea on CPAP   . Scoliosis    Past Surgical History:  Procedure Laterality Date  . BREAST SURGERY  2009   breast lump removed benign  . CARDIAC CATHETERIZATION N/A 03/15/2015   Procedure: Left Heart Cath and Coronary Angiography;  Surgeon: Belva Crome, MD;  Location: [facility];  Service: Cardiovascular;  Laterality: N/A;   Social History  Substance Use Topics  . Smoking status: Never Smoker  . Smokeless tobacco: Never Used  . Alcohol use No   Family History  Problem Relation Age of Onset  . Heart attack Brother   . Colon cancer Neg Hx    Allergies  Allergen Reactions  . Loratadine     REACTION: not effective  . Simvastatin     REACTION: intolerant   Current Outpatient Prescriptions on File Prior to Visit  Medication Sig Dispense Refill  . aspirin 81 MG tablet Take 81 mg by mouth daily.      . Blood Glucose Monitoring Suppl (TRUE METRIX AIR GLUCOSE METER) W/DEVICE KIT 1 kit by Other route 2 (two) times daily. Check blood sugar twice daily and as directed. Dx E11.65  1 kit 0  . cetirizine (ZYRTEC) 10 MG tablet Take 1 tablet (10 mg total) by mouth daily. (Patient taking differently: Take 10 mg by mouth as needed. ) 90 tablet 1  . Coenzyme Q10 (CO Q 10 PO) Take 1 tablet by mouth  daily.     . Cyanocobalamin (VITAMIN B12 PO) Take 1 tablet by mouth daily.    . fluorouracil (EFUDEX) 5 % cream Apply 2 application topically daily as needed.     Marland Kitchen glipiZIDE (GLUCOTROL) 10 MG tablet TAKE 1 TABLET TWICE DAILY WITH MEALS 180 tablet 1  . glucose blood (TRUE METRIX BLOOD GLUCOSE TEST) test strip CHECK BLOOD SUGAR TWICE DAILY  AND AS DIRECTED (DX. E11.65) 300 each 0  . hydrochlorothiazide (HYDRODIURIL) 12.5 MG tablet TAKE 1 TABLET EVERY DAY 90 tablet 3  . lisinopril (PRINIVIL,ZESTRIL) 10 MG tablet TAKE 1 TABLET (10 MG TOTAL) BY MOUTH DAILY. PLEASE CONTACT OFFICE FOR ADDITIONAL REFILLS 90 tablet 3  . nitroGLYCERIN (NITROSTAT) 0.4 MG SL tablet Place 1 tablet (0.4 mg total) under the tongue every 5 (five) minutes as needed for chest pain. 25 tablet 0  . pravastatin (PRAVACHOL) 80 MG tablet TAKE 1 TABLET EVERY EVENING 90 tablet 3  . triamcinolone (NASACORT) 55 MCG/ACT AERO nasal inhaler USE ONE SPRAYS EACH NOSTRIL EVERY DAY as needed    . TRUEPLUS LANCETS 30G MISC Check blood sugar twice daily and as directed. Dx E11.65 200 each 1  . UNIFINE PENTIPS 31G X 6 MM MISC USE PEN NEEDLES WHEN TAKING LANTUS AT BEDTIME 100 each 0   No current facility-administered medications on file prior to visit.     Review of Systems    Review of Systems  Constitutional: Negative for fever, appetite change,  and unexpected weight change.  Eyes: Negative for pain and visual disturbance.  Respiratory: Negative for cough and shortness of breath.  pos for low exercise endurance Cardiovascular: Negative for cp or palpitations    Gastrointestinal: Negative for nausea, diarrhea and constipation.  Genitourinary: Negative for urgency and frequency.  Skin: Negative for pallor or rash   Neurological:  Negative for weakness, light-headedness, numbness and headaches.  Hematological: Negative for adenopathy. Does not bruise/bleed easily.  Psychiatric/Behavioral: Negative for dysphoric mood. The patient is not nervous/anxious.      Objective:   Physical Exam  Constitutional: He appears well-developed and well-nourished. No distress.  obese and well appearing  Central obesity  HENT:  Head: Normocephalic and atraumatic.  Right Ear: External ear normal.  Left Ear: External ear normal.  Nose: Nose normal.  Mouth/Throat: Oropharynx is clear and moist.  Nares are boggy  Eyes: Conjunctivae and EOM are normal. Pupils are equal, round, and reactive to light. Right eye exhibits no discharge. Left eye exhibits no discharge. No scleral icterus.  Neck: Normal range of motion. Neck supple. No JVD present. Carotid bruit is not present. No thyromegaly present.  Cardiovascular: Normal rate, regular rhythm, normal heart sounds and intact distal pulses.  Exam reveals no gallop.   Pulmonary/Chest: Effort normal and breath sounds normal. No respiratory distress. He has no wheezes. He exhibits no tenderness.  Good air exch No wheezing   Abdominal: Soft. Bowel sounds are normal. He exhibits no distension, no abdominal bruit and no mass. There is no tenderness.  Musculoskeletal: He exhibits no edema or tenderness.  Lymphadenopathy:    He has no cervical adenopathy.  Neurological: He is alert. He has normal reflexes. No cranial nerve deficit. He exhibits normal muscle tone. Coordination normal.  Skin: Skin is warm and dry. No rash noted. No erythema. No pallor.  Solar aging with lentigines and aks   Psychiatric: He has a normal mood and affect.  Somewhat irritable today          Assessment & Plan:  Problem List Items Addressed This Visit      Cardiovascular and Mediastinum   Essential hypertension - Primary    bp in fair control at this time  BP Readings from Last 1 Encounters:  09/26/16 122/68     No changes needed Disc lifstyle change with low sodium diet and exercise  Labs reviewed         Respiratory   Sleep apnea    Pt needs new equipment and has not been re assessed in years (wt gain)  Ref to pulmonary      Relevant Orders   Ambulatory referral to Pulmonology     Endocrine   Diabetes type 2, uncontrolled ([place])   Relevant Medications   Insulin Glargine (LANTUS SOLOSTAR) 100 UNIT/ML Solostar Pen   metFORMIN (GLUCOPHAGE) 1000 MG tablet     Genitourinary   BPH (benign prostatic hyperplasia)    No change in symptoms  Does not want treatment or further eval from urology  Lab Results  Component Value Date   PSA 2.25 09/07/2016   PSA 2.12 08/19/2015   PSA 1.87 05/25/2013           Other   B12 deficiency    Lab Results  Component Value Date   VITAMINB12 543 05/28/2016   Will continue to take oral b12 daily      HYPERCHOLESTEROLEMIA    Disc goals for lipids and reasons to control them Rev labs with pt Rev low sat fat diet in detail Low HDL- he refuses to exercise  On pravastatin and diet-fairly stable       Obesity    Discussed how this problem influences overall health and the risks it imposes  Reviewed plan for weight loss with lower calorie diet (via better food choices and also portion control or program like weight watchers) and exercise building up to or more than 30 minutes 5 days per week including some aerobic activity   Pt states he is not motivated for this and stays active      Relevant Medications   Insulin Glargine (LANTUS SOLOSTAR) 100 UNIT/ML Solostar Pen   metFORMIN (GLUCOPHAGE) 1000 MG tablet   Routine general medical examination at a health care facility    Reviewed health habits including diet and exercise and skin cancer prevention Reviewed appropriate screening tests for age  Also reviewed health mt list, fam hx and immunization status , as well as social and family history   See HPI Rev his amw visit  Labs reviewed Ref  to pulmonary for cpap mt/testing if necessary Wt loss enc

## 2016-09-26 NOTE — Progress Notes (Signed)
Pre visit review using our clinic review tool, if applicable. No additional management support is needed unless otherwise documented below in the visit note. 

## 2016-09-28 NOTE — Assessment & Plan Note (Signed)
bp in fair control at this time  BP Readings from Last 1 Encounters:  09/26/16 122/68   No changes needed Disc lifstyle change with low sodium diet and exercise  Labs reviewed

## 2016-09-28 NOTE — Assessment & Plan Note (Signed)
Pt needs new equipment and has not been re assessed in years (wt gain)  Ref to pulmonary

## 2016-09-28 NOTE — Assessment & Plan Note (Addendum)
Reviewed health habits including diet and exercise and skin cancer prevention Reviewed appropriate screening tests for age  Also reviewed health mt list, fam hx and immunization status , as well as social and family history   See HPI Rev his amw visit  Labs reviewed Ref to pulmonary for cpap mt/testing if necessary Wt loss enc

## 2016-09-28 NOTE — Assessment & Plan Note (Signed)
Disc goals for lipids and reasons to control them Rev labs with pt Rev low sat fat diet in detail Low HDL- he refuses to exercise  On pravastatin and diet-fairly stable

## 2016-09-28 NOTE — Assessment & Plan Note (Signed)
Lab Results  Component Value Date   [id number] 732 05/28/2016   Will continue to take oral b12 daily

## 2016-09-28 NOTE — Assessment & Plan Note (Signed)
Discussed how this problem influences overall health and the risks it imposes  Reviewed plan for weight loss with lower calorie diet (via better food choices and also portion control or program like weight watchers) and exercise building up to or more than 30 minutes 5 days per week including some aerobic activity   Pt states he is not motivated for this and stays active

## 2016-09-28 NOTE — Assessment & Plan Note (Signed)
No change in symptoms  Does not want treatment or further eval from urology  Lab Results  Component Value Date   PSA 2.25 09/07/2016   PSA 2.12 08/19/2015   PSA 1.87 05/25/2013

## 2016-10-23 ENCOUNTER — Encounter: Payer: Self-pay | Admitting: Family Medicine

## 2016-10-23 DIAGNOSIS — Z1212 Encounter for screening for malignant neoplasm of rectum: Secondary | ICD-10-CM | POA: Diagnosis not present

## 2016-10-23 DIAGNOSIS — Z1211 Encounter for screening for malignant neoplasm of colon: Secondary | ICD-10-CM | POA: Diagnosis not present

## 2016-10-29 DIAGNOSIS — Z85828 Personal history of other malignant neoplasm of skin: Secondary | ICD-10-CM | POA: Diagnosis not present

## 2016-10-29 DIAGNOSIS — D225 Melanocytic nevi of trunk: Secondary | ICD-10-CM | POA: Diagnosis not present

## 2016-10-29 DIAGNOSIS — C44329 Squamous cell carcinoma of skin of other parts of face: Secondary | ICD-10-CM | POA: Diagnosis not present

## 2016-10-29 DIAGNOSIS — D2272 Melanocytic nevi of left lower limb, including hip: Secondary | ICD-10-CM | POA: Diagnosis not present

## 2016-10-29 DIAGNOSIS — D2261 Melanocytic nevi of right upper limb, including shoulder: Secondary | ICD-10-CM | POA: Diagnosis not present

## 2016-10-29 DIAGNOSIS — L570 Actinic keratosis: Secondary | ICD-10-CM | POA: Diagnosis not present

## 2016-10-29 DIAGNOSIS — X32XXXA Exposure to sunlight, initial encounter: Secondary | ICD-10-CM | POA: Diagnosis not present

## 2016-10-29 DIAGNOSIS — D485 Neoplasm of uncertain behavior of skin: Secondary | ICD-10-CM | POA: Diagnosis not present

## 2016-10-30 LAB — COLOGUARD

## 2016-10-31 ENCOUNTER — Telehealth: Payer: Self-pay

## 2016-10-31 DIAGNOSIS — R195 Other fecal abnormalities: Secondary | ICD-10-CM | POA: Insufficient documentation

## 2016-10-31 NOTE — Telephone Encounter (Signed)
Ref done  Will route to [facility] 

## 2016-10-31 NOTE — Telephone Encounter (Signed)
[name] with [employer] wanting to make sure cologuard result received on Ryan Garcia. If did not receive result let cologuard know.

## 2016-10-31 NOTE — Telephone Encounter (Signed)
We received it today. It was positive,  Dr. Glori Bickers placed a message on the results and she wants me to call pt advise of the results and let him know we need to refer him to GI for a colonoscopy.

## 2016-10-31 NOTE — Telephone Encounter (Signed)
Called pt and spoke with wife, I advise her of cologuard results. She said pt does want to proceed with colonoscopy, he has been to [facility] in the past and is okay going back to them, please put referral in and I advise pt's wife our [facility] will call to schedule appt., copy of results given to [facility], and original faxed placed back in Dr. Marliss Coots inbox

## 2016-11-05 ENCOUNTER — Ambulatory Visit (INDEPENDENT_AMBULATORY_CARE_PROVIDER_SITE_OTHER): Payer: Medicare HMO | Admitting: Internal Medicine

## 2016-11-05 ENCOUNTER — Encounter: Payer: Self-pay | Admitting: Internal Medicine

## 2016-11-05 VITALS — BP 130/60 | HR 67 | Resp 16 | Ht 71.0 in | Wt 251.0 lb

## 2016-11-05 DIAGNOSIS — G4719 Other hypersomnia: Secondary | ICD-10-CM | POA: Diagnosis not present

## 2016-11-05 DIAGNOSIS — G4733 Obstructive sleep apnea (adult) (pediatric): Secondary | ICD-10-CM
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
## 2016-11-05 NOTE — Patient Instructions (Addendum)
Will order new auto pap device, pressure range 5-20 cmH2O    Sleep Apnea Sleep apnea is disorder that affects a person's sleep. A person with sleep apnea has abnormal pauses in their breathing when they sleep. It is hard for them to get a good sleep. This makes a person tired during the day. It also can lead to other physical problems. There are three types of sleep apnea. One type is when breathing stops for a short time because your airway is blocked (obstructive sleep apnea). Another type is when the brain sometimes fails to give the normal signal to breathe to the muscles that control your breathing (central sleep apnea). The third type is a combination of the other two types. HOME CARE   Take all medicine as told by your doctor.  Avoid alcohol, calming medicines (sedatives), and depressant drugs.  Try to lose weight if you are overweight. Talk to your doctor about a healthy weight goal.  Your doctor may have you use a device that helps to open your airway. It can help you get the air that you need. It is called a positive airway pressure (PAP) device.   MAKE SURE YOU:   Understand these instructions.  Will watch your condition.  Will get help right away if you are not doing well or get worse.  It may take approximately 1 month for you to get used to wearing her CPAP every night.  Be sure to work with your machine to get used to it, be patient, it may take time!

## 2016-11-06 ENCOUNTER — Telehealth: Payer: Self-pay | Admitting: Internal Medicine

## 2016-11-06 ENCOUNTER — Other Ambulatory Visit: Payer: Self-pay | Admitting: *Deleted

## 2016-11-06 DIAGNOSIS — G4733 Obstructive sleep apnea (adult) (pediatric): Secondary | ICD-10-CM

## 2016-11-06 NOTE — Telephone Encounter (Signed)
Pt wife states [facility] needs rx for pt sleep machine. Please call. She states [facility] only received a referral.

## 2016-11-06 NOTE — Telephone Encounter (Signed)
Placed a new order for a CPAP machine with the auto range settings. Informed wife that [facility] will get a new referral with the auto range on it. Nothing further needed.

## 2016-11-07 ENCOUNTER — Other Ambulatory Visit: Payer: Self-pay

## 2016-11-07 MED ORDER — INSULIN GLARGINE 100 UNIT/ML SOLOSTAR PEN: PEN_INJECTOR | SUBCUTANEOUS | 3 refills | Status: DC

## 2016-11-07 MED ORDER — INSULIN PEN NEEDLE 31G X 6 MM MISC: 1 refills | Status: DC

## 2016-11-07 NOTE — Telephone Encounter (Signed)
pts wife request refill lantus and pen needles to [facility]; Mrs Splawn said [facility] would not accept the printed rx. Done as requested.

## 2016-11-14 ENCOUNTER — Other Ambulatory Visit: Payer: Self-pay | Admitting: Family Medicine

## 2016-11-20 ENCOUNTER — Encounter: Payer: Self-pay | Admitting: Gastroenterology

## 2016-11-20 ENCOUNTER — Ambulatory Visit (INDEPENDENT_AMBULATORY_CARE_PROVIDER_SITE_OTHER): Payer: Medicare HMO | Admitting: Gastroenterology

## 2016-11-20 VITALS — BP 142/66 | HR 84 | Ht 71.0 in | Wt 256.4 lb

## 2016-11-20 DIAGNOSIS — I2510 Atherosclerotic heart disease of native coronary artery without angina pectoris: Secondary | ICD-10-CM | POA: Diagnosis not present

## 2016-11-20 DIAGNOSIS — R0689 Other abnormalities of breathing: Secondary | ICD-10-CM | POA: Diagnosis not present

## 2016-11-20 DIAGNOSIS — R195 Other fecal abnormalities: Secondary | ICD-10-CM | POA: Diagnosis not present

## 2016-11-20 DIAGNOSIS — G4733 Obstructive sleep apnea (adult) (pediatric): Secondary | ICD-10-CM | POA: Diagnosis not present

## 2016-11-20 NOTE — Progress Notes (Signed)
     [facility] Progress Note  Chief Complaint: Positive colo-guard test  Subjective  History:  This is an 83 year old man I last saw this to over a year ago when he was scheduled for surveillance colonoscopy due to history of colon polyps. Please see the note from that encounter in the endoscopy lab that day. Briefly, records review indicated that he has severe multivessel coronary disease and had declined CABG. He had chronic exertional dyspnea that was felt possibly to be his anginal equivalent. Overall, I felt that the risks outweigh the benefits of the routine colonoscopy on this patient. He denies abdominal pain, altered bowel habits, rectal bleeding, anorexia, weight loss or dysphagia. At a recent primary care visit he was ordered a colo-guard test and it is positive.  ROS: Cardiovascular:  no chest pain Respiratory: no dyspnea  The patient's Past Medical, Family and Social History were reviewed and are on file in the EMR.  Objective:  Med list reviewed  Vital signs in last 24 hrs: Vitals:   11/20/16 1610  BP: (!) 142/66  Pulse: 84    Physical Exam    HEENT: sclera anicteric, oral mucosa moist without lesions  Neck: supple, no thyromegaly, JVD or lymphadenopathy  Cardiac: RRR without murmurs, S1S2 heard, no peripheral edema  Pulm: clear to auscultation bilaterally, normal RR and effort noted  Abdomen: Obese, soft, No tenderness, with active bowel sounds. No guarding or palpable hepatosplenomegaly.  Skin; warm and dry, no jaundice or rash    @ASSESSMENTPLANBEGIN @ Assessment: Encounter Diagnoses  Name Primary?  . Positive colorectal cancer screening using Cologuard test   . Coronary artery disease involving native coronary artery of native heart without angina pectoris Yes    Despite the positive stool test, I still think this patient is too high risk for colonoscopy. If the patient is felt to be too high risk for colon cancer screening, then a  colo-guard test should generally be avoided. I discussed with him and his wife the possible meaning of a positive test, such as false positive, non-dysplastic polyps, dysplastic polyps, colon cancer and positive fecal occult blood for multiple reasons.. In the 1 large study of almost 10,000 patients with colo-guard, positive study only meant colon cancer in 60 subjective. The patient also has no symptoms at present. Taken together, I think this means that the risks of a colonoscopy outweigh the expected benefits in this patient.  Plan:  He is returned to primary care and no further colon cancer screening as needed. If he should develop rectal bleeding, chronic constipation of no other apparent cause, chronic abdominal pain or iron deficiency anemia, then I will reevaluate him.  Total time 15 minutes, over half spent in counseling and coordination of care.   Nelida Meuse III   CC: Loura Pardon, MD

## 2016-11-20 NOTE — Patient Instructions (Addendum)
If you are age 81 or older, your body mass index should be between 23-30. Your Body mass index is 35.76 kg/m. If this is out of the aforementioned range listed, please consider follow up with your Primary Care Provider.  If you are age 33 or younger, your body mass index should be between 19-25. Your Body mass index is 35.76 kg/m. If this is out of the aformentioned range listed, please consider follow up with your Primary Care Provider.   Thank you for choosing [facility]  Dr Wilfrid Lund III

## 2016-11-29 DIAGNOSIS — C44329 Squamous cell carcinoma of skin of other parts of face: Secondary | ICD-10-CM | POA: Diagnosis not present

## 2016-12-21 DIAGNOSIS — G4733 Obstructive sleep apnea (adult) (pediatric): Secondary | ICD-10-CM | POA: Diagnosis not present

## 2016-12-21 DIAGNOSIS — R0689 Other abnormalities of breathing: Secondary | ICD-10-CM | POA: Diagnosis not present

## 2017-01-18 ENCOUNTER — Ambulatory Visit: Payer: Medicare HMO | Admitting: Gastroenterology

## 2017-01-20 DIAGNOSIS — G4733 Obstructive sleep apnea (adult) (pediatric): Secondary | ICD-10-CM | POA: Diagnosis not present

## 2017-01-20 DIAGNOSIS — R0689 Other abnormalities of breathing: Secondary | ICD-10-CM | POA: Diagnosis not present

## 2017-01-23 ENCOUNTER — Other Ambulatory Visit: Payer: Self-pay | Admitting: Family Medicine

## 2017-02-20 DIAGNOSIS — R0689 Other abnormalities of breathing: Secondary | ICD-10-CM | POA: Diagnosis not present

## 2017-02-20 DIAGNOSIS — G4733 Obstructive sleep apnea (adult) (pediatric): Secondary | ICD-10-CM | POA: Diagnosis not present

## 2017-03-14 DIAGNOSIS — D225 Melanocytic nevi of trunk: Secondary | ICD-10-CM | POA: Diagnosis not present

## 2017-03-14 DIAGNOSIS — L570 Actinic keratosis: Secondary | ICD-10-CM | POA: Diagnosis not present

## 2017-03-14 DIAGNOSIS — Z85828 Personal history of other malignant neoplasm of skin: Secondary | ICD-10-CM | POA: Diagnosis not present

## 2017-03-14 DIAGNOSIS — D2272 Melanocytic nevi of left lower limb, including hip: Secondary | ICD-10-CM | POA: Diagnosis not present

## 2017-03-14 DIAGNOSIS — D2261 Melanocytic nevi of right upper limb, including shoulder: Secondary | ICD-10-CM | POA: Diagnosis not present

## 2017-03-14 DIAGNOSIS — X32XXXA Exposure to sunlight, initial encounter: Secondary | ICD-10-CM | POA: Diagnosis not present

## 2017-03-23 DIAGNOSIS — G4733 Obstructive sleep apnea (adult) (pediatric): Secondary | ICD-10-CM | POA: Diagnosis not present

## 2017-03-23 DIAGNOSIS — R0689 Other abnormalities of breathing: Secondary | ICD-10-CM | POA: Diagnosis not present

## 2017-04-04 ENCOUNTER — Other Ambulatory Visit: Payer: Medicare HMO

## 2017-04-08 ENCOUNTER — Ambulatory Visit: Payer: Medicare HMO | Admitting: Family Medicine

## 2017-04-08 ENCOUNTER — Other Ambulatory Visit: Payer: Self-pay | Admitting: Cardiology

## 2017-04-22 DIAGNOSIS — G4733 Obstructive sleep apnea (adult) (pediatric): Secondary | ICD-10-CM | POA: Diagnosis not present

## 2017-04-22 DIAGNOSIS — R0689 Other abnormalities of breathing: Secondary | ICD-10-CM | POA: Diagnosis not present

## 2017-04-23 ENCOUNTER — Other Ambulatory Visit (INDEPENDENT_AMBULATORY_CARE_PROVIDER_SITE_OTHER): Payer: Medicare HMO

## 2017-04-23 DIAGNOSIS — IMO0001 Reserved for inherently not codable concepts without codable children: Secondary | ICD-10-CM

## 2017-04-23 DIAGNOSIS — E1165 Type 2 diabetes mellitus with hyperglycemia: Secondary | ICD-10-CM

## 2017-04-23 LAB — BASIC METABOLIC PANEL
BUN: 17 mg/dL (ref 6–23)
CALCIUM: 9.1 mg/dL (ref 8.4–10.5)
CHLORIDE: 106 meq/L (ref 96–112)
CO2: 29 mEq/L (ref 19–32)
CREATININE: 1.13 mg/dL (ref 0.40–1.50)
GFR: 66.17 mL/min (ref >60.00)
Glucose, Bld: 167 mg/dL — ABNORMAL HIGH (ref 70–99)
Potassium: 4.3 mEq/L (ref 3.5–5.1)
Sodium: 142 mEq/L (ref 135–145)

## 2017-04-23 LAB — HEMOGLOBIN A1C: HEMOGLOBIN A1C: 7.8 % — AB (ref 4.6–6.5)

## 2017-04-29 ENCOUNTER — Ambulatory Visit (INDEPENDENT_AMBULATORY_CARE_PROVIDER_SITE_OTHER): Payer: Medicare HMO | Admitting: Family Medicine

## 2017-04-29 ENCOUNTER — Encounter: Payer: Self-pay | Admitting: Family Medicine

## 2017-04-29 VITALS — BP 138/70 | HR 58 | Temp 98.1°F | Ht 71.0 in | Wt 254.5 lb

## 2017-04-29 DIAGNOSIS — E7800 Pure hypercholesterolemia, unspecified: Secondary | ICD-10-CM | POA: Diagnosis not present

## 2017-04-29 DIAGNOSIS — Z6835 Body mass index (BMI) 35.0-35.9, adult: Secondary | ICD-10-CM | POA: Diagnosis not present

## 2017-04-29 DIAGNOSIS — E1165 Type 2 diabetes mellitus with hyperglycemia: Secondary | ICD-10-CM

## 2017-04-29 DIAGNOSIS — I10 Essential (primary) hypertension: Secondary | ICD-10-CM

## 2017-04-29 DIAGNOSIS — L570 Actinic keratosis: Secondary | ICD-10-CM | POA: Diagnosis not present

## 2017-04-29 DIAGNOSIS — Z23 Encounter for immunization: Secondary | ICD-10-CM

## 2017-04-29 MED ORDER — INSULIN GLARGINE 100 UNIT/ML SOLOSTAR PEN: PEN_INJECTOR | SUBCUTANEOUS | 3 refills | Status: DC

## 2017-04-29 NOTE — Assessment & Plan Note (Signed)
R hand and arm  Pt has upcoming f/u with derm (jan)= will have them treated  Urged not to pick them  Urged to use sun protection

## 2017-04-29 NOTE — Assessment & Plan Note (Signed)
Fairly stable Disc goals for lipids and reasons to control them Rev labs with pt Rev low sat fat diet in detail LDL in 80s  Low HDL-no exercise Continue pravastatin

## 2017-04-29 NOTE — Assessment & Plan Note (Signed)
Discussed how this problem influences overall health and the risks it imposes  Reviewed plan for weight loss with lower calorie diet (via better food choices and also portion control or program like weight watchers) and exercise building up to or more than 30 minutes 5 days per week including some aerobic activity    Pt cannot do much due to sob/CAD He refuses to watch diet

## 2017-04-29 NOTE — Assessment & Plan Note (Signed)
Control is not optimal with worse diet Lab Results  Component Value Date   HGBA1C 7.8 (H) 04/23/2017   Pt is candid in stating he will not change diet at all  Unable to be active due to sob from heart problems  Enc him to do the best he can  Eye exam due in dec  Foot exam done  On ace for renal protection Continue metformin and glipizide Inc lantus to 45 u and watch for hypoglycemia F/u 6 mo

## 2017-04-29 NOTE — Patient Instructions (Addendum)
Let's go up on lantus to 45 units (your A1C is going up)  If you change your mind about diet- please try to eat better /stick to a diabetic diet   The spots on your arms may be actinic keratoses (could turn into skin cancer)-have your dermatologist treat them (may want to freeze them)   Your eye exam is due in December   Flu shot today   Follow up in 6 months for annual exam  The staff up front will make your appointment

## 2017-04-29 NOTE — Progress Notes (Signed)
Subjective:    Patient ID: Ryan Garcia, male    DOB: 08-27-1933, 83 y.o.   MRN: [id number]  HPI Here for f/u of chronic health problems   Nothing new going on  Has some skin spots/warts on his hand and arm   Still quite sob from heart dz  Cannot do as much (gets sob walking to the barn)    Wt Readings from Last 3 Encounters:  04/29/17 254 lb 8 oz (115.4 kg)  11/20/16 256 lb 6.4 oz (116.3 kg)  11/05/16 251 lb (113.9 kg)  down 2 lbs  35.50 kg/m    Went to GI for positive cologuard test- decided against colonoscopy due to high risk status   bp is stable today  No cp or palpitations or headaches or edema  No side effects to medicines  BP Readings from Last 3 Encounters:  04/29/17 138/70  11/20/16 (!) 142/66  11/05/16 130/60      Diabetes Home sugar results - vary depending on eating habits  Highest have been 300  No lows -none  DM diet - Kinda reckless - if he wants something he eats that and it is not going to change  Exercise -unable  Symptoms A1C last  Lab Results  Component Value Date   HGBA1C 7.8 (H) 04/23/2017  up from 7.4   No problems with medications - glipizide and lantus 40 units and metformin  Renal protection ace  Last eye exam  12/17  Hyperlipidemia with CAD Lab Results  Component Value Date   CHOL 139 09/07/2016   CHOL 135 05/28/2016   CHOL 125 11/18/2015   Lab Results  Component Value Date   HDL 35.60 (L) 09/07/2016   HDL 35.50 (L) 05/28/2016   HDL 28.00 (L) 11/18/2015   Lab Results  Component Value Date   LDLCALC 86 09/07/2016   LDLCALC 82 05/28/2016   LDLCALC 76 11/18/2015   Lab Results  Component Value Date   TRIG 88.0 09/07/2016   TRIG 91.0 05/28/2016   TRIG 101.0 11/18/2015   Lab Results  Component Value Date   CHOLHDL 4 09/07/2016   CHOLHDL 4 05/28/2016   CHOLHDL 4 11/18/2015   Lab Results  Component Value Date   LDLDIRECT 78.5 11/17/2012   LDLDIRECT 156.5 05/09/2010   LDLDIRECT 152.9 08/09/2009  on  statin Fairly stable HDL low-cannot exercise  Eating a lot of eggs and sausage and bacon -refuses to change  Patient Active Problem List   Diagnosis Date Noted  . Actinic keratoses 04/29/2017  . Positive colorectal cancer screening using Cologuard test 10/31/2016  . BPH (benign prostatic hyperplasia) 09/04/2016  . B12 deficiency 11/22/2015  . Routine general medical examination at a health care facility 08/26/2015  . Abnormal nuclear cardiac imaging test   . SOB (shortness of breath) on exertion 11/27/2013  . SCC (squamous cell carcinoma), face 11/16/2013  . Encounter for Medicare annual wellness exam 05/29/2013  . Prostate cancer screening 07/15/2012  . Abnormal chest x-ray 05/24/2011  . CAD (coronary artery disease) 05/17/2011  . Nonspecific abnormal results of cardiovascular function study 04/30/2011  . Upper back pain, chronic 04/06/2011  . Abnormal EKG 04/06/2011  . Left arm pain 04/06/2011  . BACK PAIN 01/13/2010  . Diabetes type 2, uncontrolled ([place]) 10/04/2006  . HYPERCHOLESTEROLEMIA 10/04/2006  . Obesity 10/04/2006  . ERECTILE DYSFUNCTION 10/04/2006  . Essential hypertension 10/04/2006  . ALLERGIC RHINITIS 10/04/2006  . GERD 10/04/2006  . OSTEOARTHRITIS 10/04/2006  . Sleep apnea 10/04/2006  . EDEMA  10/04/2006  . SKIN CANCER, HX OF 10/04/2006   Past Medical History:  Diagnosis Date  . AC (acromioclavicular) joint bone spurs    lt ankle  . Allergy    allergic rhinitis  . Arthritis    OA  . BPH (benign prostatic hyperplasia)    microwave tx of prostate  . CAD (coronary artery disease)    Myoview 04/16/11: mild apical ischemia, study not gated.    . Chronic upper back pain   . Diabetes mellitus    type II x8 years  . GERD (gastroesophageal reflux disease)   . HLD (hyperlipidemia)    10/12: TC 208, TG 213, HDL 36, LDL 129  . Hx of skin cancer, basal cell    many skin cancers removed/under constant treatment.  . Hypertension   . Obstructive sleep apnea on  CPAP   . Scoliosis    Past Surgical History:  Procedure Laterality Date  . BREAST SURGERY  2009   breast lump removed benign  . CARDIAC CATHETERIZATION N/A 03/15/2015   Procedure: Left Heart Cath and Coronary Angiography;  Surgeon: Belva Crome, MD;  Location: [facility];  Service: Cardiovascular;  Laterality: N/A;   Social History  Substance Use Topics  . Smoking status: Never Smoker  . Smokeless tobacco: Never Used  . Alcohol use No   Family History  Problem Relation Age of Onset  . Heart attack Brother   . Colon cancer Neg Hx    Allergies  Allergen Reactions  . Loratadine     REACTION: not effective  . Simvastatin     REACTION: intolerant   Current Outpatient Prescriptions on File Prior to Visit  Medication Sig Dispense Refill  . aspirin 81 MG tablet Take 81 mg by mouth daily.      . Blood Glucose Monitoring Suppl (TRUE METRIX AIR GLUCOSE METER) W/DEVICE KIT 1 kit by Other route 2 (two) times daily. Check blood sugar twice daily and as directed. Dx E11.65 1 kit 0  . cetirizine (ZYRTEC) 10 MG tablet TAKE 1 TABLET EVERY DAY 90 tablet 1  . Coenzyme Q10 (CO Q 10 PO) Take 1 tablet by mouth daily.     . Cyanocobalamin (VITAMIN B12 PO) Take 1 tablet by mouth 2 (two) times a week.     . fluorouracil (EFUDEX) 5 % cream Apply 2 application topically daily as needed.     Marland Kitchen glipiZIDE (GLUCOTROL) 10 MG tablet TAKE 1 TABLET TWICE DAILY WITH MEALS 180 tablet 1  . glucose blood (TRUE METRIX BLOOD GLUCOSE TEST) test strip CHECK BLOOD SUGAR TWICE DAILY  AND AS DIRECTED (DX. E11.65) 300 each 0  . hydrochlorothiazide (HYDRODIURIL) 12.5 MG tablet Take 1 tablet (12.5 mg total) by mouth daily. Please schedule appointment for refills 30 tablet 0  . Insulin Pen Needle (UNIFINE PENTIPS) 31G X 6 MM MISC USE PEN NEEDLES WHEN TAKING LANTUS AT BEDTIME 100 each 1  . lisinopril (PRINIVIL,ZESTRIL) 10 MG tablet TAKE 1 TABLET (10 MG TOTAL) BY MOUTH DAILY. PLEASE CONTACT OFFICE FOR ADDITIONAL REFILLS  90 tablet 3  . metFORMIN (GLUCOPHAGE) 1000 MG tablet Take 1 tablet (1,000 mg total) by mouth 2 (two) times daily. 180 tablet 3  . nitroGLYCERIN (NITROSTAT) 0.4 MG SL tablet Place 1 tablet (0.4 mg total) under the tongue every 5 (five) minutes as needed for chest pain. 25 tablet 0  . pravastatin (PRAVACHOL) 80 MG tablet TAKE 1 TABLET EVERY EVENING 90 tablet 3  . triamcinolone (NASACORT) 55 MCG/ACT AERO nasal  inhaler USE ONE SPRAYS EACH NOSTRIL EVERY DAY as needed    . TRUEPLUS LANCETS 30G MISC Check blood sugar twice daily and as directed. Dx E11.65 200 each 1   No current facility-administered medications on file prior to visit.      Review of Systems  Constitutional: Positive for fatigue. Negative for activity change, appetite change, fever and unexpected weight change.  HENT: Negative for congestion, rhinorrhea, sore throat and trouble swallowing.   Eyes: Negative for pain, redness, itching and visual disturbance.  Respiratory: Positive for shortness of breath. Negative for cough, chest tightness and wheezing.   Cardiovascular: Negative for chest pain and palpitations.  Gastrointestinal: Negative for abdominal pain, blood in stool, constipation, diarrhea and nausea.  Endocrine: Negative for cold intolerance, heat intolerance, polydipsia and polyuria.  Genitourinary: Negative for difficulty urinating, dysuria, frequency and urgency.  Musculoskeletal: Negative for arthralgias, joint swelling and myalgias.  Skin: Negative for pallor and rash.  Neurological: Negative for dizziness, tremors, weakness, numbness and headaches.  Hematological: Negative for adenopathy. Does not bruise/bleed easily.  Psychiatric/Behavioral: Negative for decreased concentration and dysphoric mood. The patient is not nervous/anxious.        Objective:   Physical Exam  Constitutional: He appears well-developed and well-nourished. No distress.  obese and well appearing   HENT:  Head: Normocephalic and atraumatic.   Mouth/Throat: Oropharynx is clear and moist.  Eyes: Pupils are equal, round, and reactive to light. Conjunctivae and EOM are normal.  Neck: Normal range of motion. Neck supple. No JVD present. Carotid bruit is not present. No thyromegaly present.  Cardiovascular: Normal rate, regular rhythm, normal heart sounds and intact distal pulses.  Exam reveals no gallop.   Pulmonary/Chest: Effort normal and breath sounds normal. No respiratory distress. He has no wheezes. He has no rales.  No crackles  Abdominal: Soft. Bowel sounds are normal. He exhibits no distension, no abdominal bruit and no mass. There is no tenderness.  Musculoskeletal: He exhibits edema. He exhibits no tenderness.  Mild pedal edema   Lymphadenopathy:    He has no cervical adenopathy.  Neurological: He is alert. He has normal reflexes. No cranial nerve deficit. He exhibits normal muscle tone. Coordination normal.  Skin: Skin is warm and dry. No rash noted.  Psychiatric: He has a normal mood and affect.          Assessment & Plan:   Problem List Items Addressed This Visit      Cardiovascular and Mediastinum   Essential hypertension - Primary    bp in fair control at this time  BP Readings from Last 1 Encounters:  04/29/17 138/70   No changes needed Disc lifstyle change with low sodium diet and exercise  Of note-pt has not taken his medicine today yet         Endocrine   Diabetes type 2, uncontrolled ([place])    Control is not optimal with worse diet Lab Results  Component Value Date   HGBA1C 7.8 (H) 04/23/2017   Pt is candid in stating he will not change diet at all  Unable to be active due to sob from heart problems  Enc him to do the best he can  Eye exam due in dec  Foot exam done  On ace for renal protection Continue metformin and glipizide Inc lantus to 45 u and watch for hypoglycemia F/u 6 mo      Relevant Medications   Insulin Glargine (LANTUS SOLOSTAR) 100 UNIT/ML Solostar Pen      Musculoskeletal  and Integument   Actinic keratoses    R hand and arm  Pt has upcoming f/u with derm (jan)= will have them treated  Urged not to pick them  Urged to use sun protection        Other   HYPERCHOLESTEROLEMIA    Fairly stable Disc goals for lipids and reasons to control them Rev labs with pt Rev low sat fat diet in detail LDL in 80s  Low HDL-no exercise Continue pravastatin       Obesity    Discussed how this problem influences overall health and the risks it imposes  Reviewed plan for weight loss with lower calorie diet (via better food choices and also portion control or program like weight watchers) and exercise building up to or more than 30 minutes 5 days per week including some aerobic activity    Pt cannot do much due to sob/CAD He refuses to watch diet       Relevant Medications   Insulin Glargine (LANTUS SOLOSTAR) 100 UNIT/ML Solostar Pen    Other Visit Diagnoses    Need for influenza vaccination       Relevant Orders   Flu Vaccine QUAD 6+ mos PF IM (Fluarix Quad PF) (Completed)

## 2017-04-29 NOTE — Assessment & Plan Note (Signed)
bp in fair control at this time  BP Readings from Last 1 Encounters:  04/29/17 138/70   No changes needed Disc lifstyle change with low sodium diet and exercise  Of note-pt has not taken his medicine today yet

## 2017-04-30 ENCOUNTER — Other Ambulatory Visit: Payer: Self-pay | Admitting: Cardiology

## 2017-05-01 ENCOUNTER — Other Ambulatory Visit: Payer: Self-pay | Admitting: Cardiology

## 2017-05-01 NOTE — Telephone Encounter (Signed)
Rx has been sent to the pharmacy electronically. ° °

## 2017-05-03 NOTE — Progress Notes (Signed)
Cardiology Office Note    Date:  05/06/2017   ID:  Ryan Garcia, DOB 22-Nov-1933, MRN [id number]  PCP:  Ryan Greenspan, MD  Cardiologist: Dr. Stanford Garcia  Chief Complaint  Patient presents with  . Follow-up    74-monthvisit    History of Present Illness:    Ryan BUCHTAis a 83y.o. male with past medical history of CAD (s/p cath in 2012 showing 70-75% LAD stenosis, 75% D1 stenosis, 75% OM1, 60-70% RCA stenosis, and 80% PDA --> medical therapy pursued, similar findings by cath in 2016), HTN, HLD, OSA (on CPAP), and Type 2 DM who presents to the office today for annual follow-up.  He was last examined by Dr. CStanford Breedin 03/2016 and reported baseline dyspnea on exertion and fatigue but unchanged from his previous symptoms. He was continued on his current medication regimen with continued medical management of his diffuse CAD recommended as he wished to avoid CABG.   In talking with the patient today, he reports overall doing well since his last office visit. He denies any recent episodes of chest discomfort at rest or with exertion. He does have baseline dyspnea on exertion which has been present for several years per his report. 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   He denies any recent orthopnea, PND, or lower extremity edema. Is compliant with his CPAP.   BP is elevated at 158/80 on initial check during today's visit, at 148/84 on recheck. He reports having not yet taken his morning medications.   Past Medical History:  Diagnosis Date  . AC (acromioclavicular) joint bone spurs    lt ankle  . Allergy    allergic rhinitis  . Arthritis    OA  . BPH (benign prostatic hyperplasia)    microwave tx of prostate  . CAD (coronary artery disease)    a. s/p cath in 2012 showing 70-75% LAD stenosis, 75% D1 stenosis, 75% OM1, 60-70% RCA stenosis, and 80% PDA --> medical therapy  pursued b. similar findings by cath in 2016  . Chronic upper back pain   . Diabetes mellitus    type II x8 years  . GERD (gastroesophageal reflux disease)   . HLD (hyperlipidemia)    10/12: TC 208, TG 213, HDL 36, LDL 129  . Hx of skin cancer, basal cell    many skin cancers removed/under constant treatment.  . Hypertension   . Obstructive sleep apnea on CPAP   . Scoliosis     Past Surgical History:  Procedure Laterality Date  . BREAST SURGERY  2009   breast lump removed benign    Current Medications: Outpatient Medications Prior to Visit  Medication Sig Dispense Refill  . aspirin 81 MG tablet Take 81 mg by mouth daily.      . Blood Glucose Monitoring Suppl (TRUE METRIX AIR GLUCOSE METER) W/DEVICE KIT 1 kit by Other route 2 (two) times daily. Check blood sugar twice daily and as directed. Dx E11.65 1 kit 0  . cetirizine (ZYRTEC) 10 MG tablet TAKE 1 TABLET EVERY DAY 90 tablet 1  . Coenzyme Q10 (CO Q 10 PO) Take 1 tablet by mouth daily.     . Cyanocobalamin (VITAMIN B12 PO) Take 1 tablet by mouth 2 (two) times a week.     . fluorouracil (EFUDEX) 5 % cream Apply 2 application topically daily as needed.     .Marland Kitchen  glipiZIDE (GLUCOTROL) 10 MG tablet TAKE 1 TABLET TWICE DAILY WITH MEALS 180 tablet 1  . glucose blood (TRUE METRIX BLOOD GLUCOSE TEST) test strip CHECK BLOOD SUGAR TWICE DAILY  AND AS DIRECTED (DX. E11.65) 300 each 0  . Insulin Glargine (LANTUS SOLOSTAR) 100 UNIT/ML Solostar Pen INJECT  45 UNITS SUBCUTANEOUSLY AT BEDTIME 40 mL 3  . Insulin Pen Needle (UNIFINE PENTIPS) 31G X 6 MM MISC USE PEN NEEDLES WHEN TAKING LANTUS AT BEDTIME 100 each 1  . metFORMIN (GLUCOPHAGE) 1000 MG tablet Take 1 tablet (1,000 mg total) by mouth 2 (two) times daily. 180 tablet 3  . pravastatin (PRAVACHOL) 80 MG tablet TAKE 1 TABLET EVERY EVENING 90 tablet 3  . triamcinolone (NASACORT) 55 MCG/ACT AERO nasal inhaler USE ONE SPRAYS EACH NOSTRIL EVERY DAY as needed    . hydrochlorothiazide (HYDRODIURIL) 12.5  MG tablet TAKE 1 TABLET DAILY. PLEASE SCHEDULE APPOINTMENT FOR REFILLS 30 tablet 0  . lisinopril (PRINIVIL,ZESTRIL) 10 MG tablet TAKE 1 TABLET (10 MG TOTAL) BY MOUTH DAILY. PLEASE CONTACT OFFICE FOR ADDITIONAL REFILLS 90 tablet 3  . nitroGLYCERIN (NITROSTAT) 0.4 MG SL tablet Place 1 tablet (0.4 mg total) under the tongue every 5 (five) minutes as needed for chest pain. 25 tablet 0  . TRUEPLUS LANCETS 30G MISC Check blood sugar twice daily and as directed. Dx E11.65 (Patient not taking: Reported on 05/06/2017) 200 each 1   No facility-administered medications prior to visit.      Allergies:   Loratadine and Simvastatin   Social History   Socioeconomic History  . Marital status: Married    Spouse name: None  . Number of children: None  . Years of education: None  . Highest education level: None  Social Needs  . Financial resource strain: None  . Food insecurity - worry: None  . Food insecurity - inability: None  . Transportation needs - medical: None  . Transportation needs - non-medical: None  Occupational History  . None  Tobacco Use  . Smoking status: Never Smoker  . Smokeless tobacco: Never Used  Substance and Sexual Activity  . Alcohol use: No    Alcohol/week: 0.0 oz  . Drug use: No  . Sexual activity: None  Other Topics Concern  . None  Social History Narrative   Married    Physically active hard of hearing     Family History:  The patient's family history includes Heart attack in his brother.   Review of Systems:   Please see the history of present illness.     General:  No chills, fever, night sweats or weight changes.  Cardiovascular:  No chest pain, edema, orthopnea, palpitations, paroxysmal nocturnal dyspnea. Positive for dyspnea on exertion.  Dermatological: No rash, lesions/masses Respiratory: No cough, dyspnea Urologic: No hematuria, dysuria Abdominal:   No nausea, vomiting, diarrhea, bright red blood per rectum, melena, or hematemesis Neurologic:  No  visual changes, wkns, changes in mental status. All other systems reviewed and are otherwise negative except as noted above.   Physical Exam:    VS:  BP (!) 158/80   Pulse 66   Ht 6' (1.829 m)   Wt 255 lb 3.2 oz (115.8 kg)   BMI 34.61 kg/m    General: Well developed, well nourished Caucasian male appearing in no acute distress. Head: Normocephalic, atraumatic, sclera non-icteric, no xanthomas, nares are without discharge.  Neck: No carotid bruits. JVD not elevated.  Lungs: Respirations regular and unlabored, without wheezes or rales.  Heart: Regular rate and rhythm.  No S3 or S4.  No murmur, no rubs, or gallops appreciated. Abdomen: Soft, non-tender, non-distended with normoactive bowel sounds. No hepatomegaly. No rebound/guarding. No obvious abdominal masses. Msk:  Strength and tone appear normal for age. No joint deformities or effusions. Extremities: No clubbing or cyanosis. No lower extremity edema.  Distal pedal pulses are 2+ bilaterally. Neuro: Alert and oriented X 3. Moves all extremities spontaneously. No focal deficits noted. Psych:  Responds to questions appropriately with a normal affect. Skin: No rashes or lesions noted  Wt Readings from Last 3 Encounters:  05/06/17 255 lb 3.2 oz (115.8 kg)  04/29/17 254 lb 8 oz (115.4 kg)  11/20/16 256 lb 6.4 oz (116.3 kg)     Studies/Labs Reviewed:   EKG:  EKG is ordered today.  The ekg ordered today demonstrates NSR, HR 66, with no acute ST or T-wave changes when compared to prior tracings.   Recent Labs: 09/07/2016: ALT 22; Hemoglobin 13.8; Platelets 175.0; TSH 4.25 04/23/2017: BUN 17; Creatinine, Ser 1.13; Potassium 4.3; Sodium 142   Lipid Panel    Component Value Date/Time   CHOL 139 09/07/2016 1002   TRIG 88.0 09/07/2016 1002   HDL 35.60 (L) 09/07/2016 1002   CHOLHDL 4 09/07/2016 1002   VLDL 17.6 09/07/2016 1002   LDLCALC 86 09/07/2016 1002   LDLDIRECT 78.5 11/17/2012 0936    Additional studies/ records that were  reviewed today include:   Cardiac Catheterization: 03/15/2015 1. Ost Cx to Mid Cx lesion, 80% stenosed. 2. Ost 1st Mrg to 1st Mrg lesion, 95% stenosed. 3. Mid Cx to Dist Cx lesion, 65% stenosed. 4. 2nd Mrg lesion, 75% stenosed. 5. Prox LAD lesion, 85% stenosed. 6. Prox LAD to Mid LAD lesion, 90% stenosed. 7. Ost 1st Diag to 1st Diag lesion, 85% stenosed. 8. Dist LAD lesion, 65% stenosed. 9. Prox RCA to Mid RCA lesion, 50% stenosed. 10. RPDA lesion, 80% stenosed. 11. 1st RPLB lesion, 70% stenosed.    Significant diffuse coronary disease essentially unchanged from prior angiogram in 2012. Recent high risk myocardial perfusion study which was done after the patient developed dyspnea.  Severe diffuse obstruction in the proximal to mid LAD, severe disease in the ostial to proximal diagonal, severe proximal circumflex disease, diffusely diseased first marginal, high-grade PDA, moderate diffuse disease in the proximal to mid RCA.  Normal systolic left ventricular function with normal end-diastolic pressure.   RECOMMENDATIONS:   Further recommendations pending review of data by Dr. Stanford Garcia.   Assessment:    1. Coronary artery disease involving native coronary artery of native heart without angina pectoris   2. Dyspnea on exertion   3. Essential hypertension   4. Hyperlipidemia LDL goal <70   5. Obstructive sleep apnea syndrome   6. IDDM (insulin dependent diabetes mellitus) ([place])      Plan:   In order of problems listed above:  1. CAD/Dyspnea on exertion - the patient has a known history of CAD with cath in 2012 showing 70-75% LAD stenosis, 75% D1 stenosis, 75% OM1, 60-70% RCA stenosis, and 80% PDA --> medical therapy pursued. Noted to have similar findings by cath in 2016 with slight progression of LAD stenosis. - he denies any recent chest pain but does have baseline dyspnea on exertion which has been present for years but he feels as if this has increased in frequency.    - EKG today shows no acute ischemic changes. We reviewed his most recent cath in 2016 in detail as he reports not being aware that his  CAD was so extensive. He is still very active at baseline and works at a saw [place]. We reviewed the importance of keeping SL NTG on hand.  - will continue on ASA and statin therapy. No BB due to history of bradycardia. Will add Imdur 54m daily to see if this assists with his dyspnea on exertion. He will keep his follow-up appointment with Dr. CStanford Breedin 2 months for if symptoms have progressed, he may need a repeat catheterization with consideration of CABG based off of prior cath findings.   2. HTN - BP is at 158/80 on initial check during today's visit, at 148/84 on recheck. He reports having not yet taken his morning medications.  - will continue on HCTZ 12.56mdaily and Lisinopril 1014maily. He will continue to check his BP at home. If this remains elevated, would further titrate his Lisinopril to 59m67mily.   3. HLD - this is followed by his PCP. Most recent Lipid Panel in 08/2016 showed total cholesterol of 139, HDL 35, and LDL 86. Has been intolerant to Crestor and Lipitor in the past. Remains on Pravastatin 80mg59mly.    4. OSA - continued compliance with CPAP encouraged.   5. IDDM - Hgb A1c at 7.8 on most recent check.    Medication Adjustments/Labs and Tests Ordered: Current medicines are reviewed at length with the patient today.  Concerns regarding medicines are outlined above.  Medication changes, Labs and Tests ordered today are listed in the Patient Instructions below. Patient Instructions  Medication Instructions:  START Imdur 30 mg Take 1 tablet once a day  Labwork: None    Testing/Procedures: None   Follow-Up: Keep your upcoming appointment as scheduled  Any Other Special Instructions Will Be Listed Below (If Applicable).  If you need a refill on your cardiac medications before your next appointment, please call your pharmacy.    Signed, BrittErma HeritageC  05/06/2017 12:52 PM    [facility] [address] [id number]: [phone]: [phone] [id number]: [phone]

## 2017-05-06 ENCOUNTER — Ambulatory Visit: Payer: Medicare HMO | Admitting: Student

## 2017-05-06 ENCOUNTER — Encounter: Payer: Self-pay | Admitting: Student

## 2017-05-06 VITALS — BP 158/80 | HR 66 | Ht 72.0 in | Wt 255.2 lb

## 2017-05-06 DIAGNOSIS — E785 Hyperlipidemia, unspecified: Secondary | ICD-10-CM

## 2017-05-06 DIAGNOSIS — IMO0001 Reserved for inherently not codable concepts without codable children: Secondary | ICD-10-CM

## 2017-05-06 DIAGNOSIS — Z794 Long term (current) use of insulin: Secondary | ICD-10-CM

## 2017-05-06 DIAGNOSIS — R0609 Other forms of dyspnea: Secondary | ICD-10-CM

## 2017-05-06 DIAGNOSIS — E119 Type 2 diabetes mellitus without complications: Secondary | ICD-10-CM

## 2017-05-06 DIAGNOSIS — I2510 Atherosclerotic heart disease of native coronary artery without angina pectoris: Secondary | ICD-10-CM | POA: Diagnosis not present

## 2017-05-06 DIAGNOSIS — G4733 Obstructive sleep apnea (adult) (pediatric): Secondary | ICD-10-CM

## 2017-05-06 DIAGNOSIS — I10 Essential (primary) hypertension: Secondary | ICD-10-CM | POA: Diagnosis not present

## 2017-05-06 MED ORDER — LISINOPRIL 10 MG PO TABS: 10.0000 mg | ORAL_TABLET | Freq: Every day | ORAL | 2 refills | Status: DC

## 2017-05-06 MED ORDER — HYDROCHLOROTHIAZIDE 12.5 MG PO TABS: 12.5000 mg | ORAL_TABLET | Freq: Every day | ORAL | 2 refills | Status: DC

## 2017-05-06 MED ORDER — NITROGLYCERIN 0.4 MG SL SUBL
0.4000 mg | SUBLINGUAL_TABLET | SUBLINGUAL | 3 refills | Status: DC | PRN
Start: 2017-05-06 — End: 2019-05-11

## 2017-05-06 MED ORDER — ISOSORBIDE MONONITRATE ER 30 MG PO TB24: 30.0000 mg | ORAL_TABLET | Freq: Every day | ORAL | 1 refills | Status: DC

## 2017-05-06 NOTE — Patient Instructions (Addendum)
Medication Instructions:  START Imdur 30 mg Take 1 tablet once a day  Labwork: None    Testing/Procedures: None   Follow-Up: Keep your upcoming appointment as scheduled  Any Other Special Instructions Will Be Listed Below (If Applicable).  If you need a refill on your cardiac medications before your next appointment, please call your pharmacy.

## 2017-05-23 DIAGNOSIS — R0689 Other abnormalities of breathing: Secondary | ICD-10-CM | POA: Diagnosis not present

## 2017-05-23 DIAGNOSIS — G4733 Obstructive sleep apnea (adult) (pediatric): Secondary | ICD-10-CM | POA: Diagnosis not present

## 2017-05-27 ENCOUNTER — Encounter: Payer: Self-pay | Admitting: Internal Medicine

## 2017-05-29 ENCOUNTER — Encounter: Payer: Self-pay | Admitting: Internal Medicine

## 2017-05-29 ENCOUNTER — Ambulatory Visit: Payer: Medicare HMO | Admitting: Internal Medicine

## 2017-05-29 VITALS — BP 148/68 | HR 68 | Resp 16 | Ht 72.0 in | Wt 259.0 lb

## 2017-05-29 DIAGNOSIS — G4733 Obstructive sleep apnea (adult) (pediatric): Secondary | ICD-10-CM

## 2017-05-29 NOTE — Patient Instructions (Addendum)
--  Adjust auto-pressure to range of 8-16 cm H20.

## 2017-05-29 NOTE — Progress Notes (Signed)
[facility] Consultation      Assessment and Plan:  Obstructive sleep apnea.  -Long history of obstructive sleep apnea, patient notes that his machine is on working properly, and it is too old to get a replacement parts. We'll therefore order a new AutoPap. She with a pressure range of 5-20 centimeters H2O. -Will adjust auto set pressure to 8-16 cm H20.    Diabetes mellitus, essential hypertension. -Obstructive sleep apnea can contribute to elevated sleep apnea, and blood glucose levels, therefore, it would be important to continue to treat the patient's sleep apnea.   Date: 05/29/2017  MRN# [id number] Ryan Garcia August 19, 1933    Ryan Garcia is a 83 y.o. old male seen in consultation for chief complaint of:    Chief Complaint  Patient presents with  . Sleep Apnea    DME-[facility] pt wears nightly but spouse states machine is very loud.    HPI:   He was diagnosed with OSA 25 years or more, last visit his machine was not working, therefore we ordered a new AutoPap.He has been doing well with it, he wears it all night. He is more awake during the day.   He continues to do well with the machine. He is having trouble with his resevoir and can not get a replacement due to the age of the machine. [facility] recommended that he come here to get a script for a new machine.   Download data 30 days as of 05/27/17; usage greater than 4 hours is 30/30.  Average usage on days used 8 hours 9 minutes.  AutoSet 5-20.  Median pressure is 12, 95th percentile pressure is 13, maximum pressure is 14.4.  Medication:   Reviewed. Includes cetirizine   Allergies:  Loratadine and Simvastatin  Review of Systems: Gen:  Denies  fever, sweats, chills HEENT: Denies blurred vision, double vision. bleeds, sore throat Cvc:  No dizziness, chest pain. Resp:   Denies cough or sputum production, shortness of breath Gi: Denies swallowing difficulty, stomach pain. Gu:  Denies bladder incontinence,  burning urine Ext:   No Joint pain, stiffness. Skin: No skin rash,  hives  Endoc:  No polyuria, polydipsia. Psych: No depression, insomnia. Other:  All other systems were reviewed with the patient and were negative other that what is mentioned in the HPI.   Physical Examination:   VS: BP (!) 148/68 (BP Location: Left Arm, Cuff Size: Large)   Pulse 68   Resp 16   Ht 6' (1.829 m)   Wt 259 lb (117.5 kg)   SpO2 95%   BMI 35.13 kg/m   General Appearance: No distress  Neuro:without focal findings,  speech normal,  HEENT: PERRLA, EOM intact.  [place] 3.  Pulmonary: normal breath sounds, No wheezing.  CardiovascularNormal S1,S2.  No m/r/g.   Abdomen: Benign, Soft, non-tender. Renal:  No costovertebral tenderness  GU:  No performed at this time. Endoc: No evident thyromegaly, no signs of acromegaly. Skin:   warm, no rashes, no ecchymosis  Extremities: normal, no cyanosis, clubbing.  Other findings:    LABORATORY PANEL:   CBC No results for input(s): WBC, HGB, HCT, PLT in the last 168 hours. ------------------------------------------------------------------------------------------------------------------  Chemistries  No results for input(s): NA, K, CL, CO2, GLUCOSE, BUN, CREATININE, CALCIUM, MG, AST, ALT, ALKPHOS, BILITOT in the last 168 hours.  Invalid input(s): GFRCGP ------------------------------------------------------------------------------------------------------------------  Cardiac Enzymes No results for input(s): TROPONINI in the last 168 hours. ------------------------------------------------------------  RADIOLOGY:  No results found.  Thank  you for the consultation and for allowing [facility] to assist in the care of your patient. Our recommendations are noted above.  Please contact us if we can be of further service.   Marda Stalker, MD.  Board Certified in Internal Medicine, Pulmonary Medicine, [place], and Sleep Medicine.  [facility] Office Number: [phone]  Patricia Pesa, M.D.  Merton Border, M.D  05/29/2017

## 2017-06-10 ENCOUNTER — Other Ambulatory Visit: Payer: Self-pay | Admitting: Cardiology

## 2017-06-10 ENCOUNTER — Other Ambulatory Visit: Payer: Self-pay | Admitting: Family Medicine

## 2017-06-17 ENCOUNTER — Emergency Department (HOSPITAL_COMMUNITY): Payer: Medicare HMO

## 2017-06-17 ENCOUNTER — Encounter (HOSPITAL_COMMUNITY): Payer: Self-pay | Admitting: Neurology

## 2017-06-17 ENCOUNTER — Inpatient Hospital Stay (HOSPITAL_COMMUNITY)
Admission: EM | Admit: 2017-06-17 | Discharge: 2017-06-19 | DRG: 247 | Disposition: A | Discharge status: Home / Self Care | Payer: Medicare HMO | Attending: Cardiology | Admitting: Cardiology

## 2017-06-17 ENCOUNTER — Telehealth: Payer: Self-pay | Admitting: Cardiology

## 2017-06-17 DIAGNOSIS — E119 Type 2 diabetes mellitus without complications: Secondary | ICD-10-CM | POA: Diagnosis present

## 2017-06-17 DIAGNOSIS — K219 Gastro-esophageal reflux disease without esophagitis: Secondary | ICD-10-CM | POA: Diagnosis present

## 2017-06-17 DIAGNOSIS — Z6833 Body mass index (BMI) 33.0-33.9, adult: Secondary | ICD-10-CM

## 2017-06-17 DIAGNOSIS — Z9861 Coronary angioplasty status: Secondary | ICD-10-CM

## 2017-06-17 DIAGNOSIS — Z794 Long term (current) use of insulin: Secondary | ICD-10-CM | POA: Diagnosis not present

## 2017-06-17 DIAGNOSIS — I10 Essential (primary) hypertension: Secondary | ICD-10-CM | POA: Diagnosis present

## 2017-06-17 DIAGNOSIS — Z006 Encounter for examination for normal comparison and control in clinical research program: Secondary | ICD-10-CM

## 2017-06-17 DIAGNOSIS — I200 Unstable angina: Secondary | ICD-10-CM | POA: Diagnosis not present

## 2017-06-17 DIAGNOSIS — Z85828 Personal history of other malignant neoplasm of skin: Secondary | ICD-10-CM | POA: Diagnosis not present

## 2017-06-17 DIAGNOSIS — I25110 Atherosclerotic heart disease of native coronary artery with unstable angina pectoris: Secondary | ICD-10-CM | POA: Diagnosis present

## 2017-06-17 DIAGNOSIS — G4733 Obstructive sleep apnea (adult) (pediatric): Secondary | ICD-10-CM | POA: Diagnosis present

## 2017-06-17 DIAGNOSIS — E669 Obesity, unspecified: Secondary | ICD-10-CM | POA: Diagnosis present

## 2017-06-17 DIAGNOSIS — R079 Chest pain, unspecified: Secondary | ICD-10-CM | POA: Diagnosis present

## 2017-06-17 DIAGNOSIS — I2510 Atherosclerotic heart disease of native coronary artery without angina pectoris: Secondary | ICD-10-CM | POA: Diagnosis present

## 2017-06-17 DIAGNOSIS — N400 Enlarged prostate without lower urinary tract symptoms: Secondary | ICD-10-CM | POA: Diagnosis present

## 2017-06-17 DIAGNOSIS — I361 Nonrheumatic tricuspid (valve) insufficiency: Secondary | ICD-10-CM | POA: Diagnosis not present

## 2017-06-17 DIAGNOSIS — I214 Non-ST elevation (NSTEMI) myocardial infarction: Principal | ICD-10-CM | POA: Diagnosis present

## 2017-06-17 DIAGNOSIS — IMO0001 Reserved for inherently not codable concepts without codable children: Secondary | ICD-10-CM | POA: Diagnosis present

## 2017-06-17 DIAGNOSIS — E7800 Pure hypercholesterolemia, unspecified: Secondary | ICD-10-CM | POA: Diagnosis present

## 2017-06-17 DIAGNOSIS — Z6835 Body mass index (BMI) 35.0-35.9, adult: Secondary | ICD-10-CM

## 2017-06-17 DIAGNOSIS — Z79899 Other long term (current) drug therapy: Secondary | ICD-10-CM | POA: Diagnosis not present

## 2017-06-17 DIAGNOSIS — Z955 Presence of coronary angioplasty implant and graft: Secondary | ICD-10-CM

## 2017-06-17 DIAGNOSIS — Z8249 Family history of ischemic heart disease and other diseases of the circulatory system: Secondary | ICD-10-CM | POA: Diagnosis not present

## 2017-06-17 DIAGNOSIS — Z7982 Long term (current) use of aspirin: Secondary | ICD-10-CM

## 2017-06-17 HISTORY — DX: Non-ST elevation (NSTEMI) myocardial infarction: I21.4

## 2017-06-17 LAB — BASIC METABOLIC PANEL
ANION GAP: 9 (ref 5–15)
BUN: 18 mg/dL (ref 6–20)
CO2: 27 mmol/L (ref 22–32)
Calcium: 9.1 mg/dL (ref 8.9–10.3)
Chloride: 105 mmol/L (ref 101–111)
Creatinine, Ser: 1.19 mg/dL (ref 0.61–1.24)
GFR calc Af Amer: >60 mL/min (ref >60)
GFR calc non Af Amer: 55 mL/min — ABNORMAL LOW (ref >60)
GLUCOSE: 269 mg/dL — AB (ref 65–99)
POTASSIUM: 3.9 mmol/L (ref 3.5–5.1)
Sodium: 141 mmol/L (ref 135–145)

## 2017-06-17 LAB — CBC
HEMATOCRIT: 42.3 % (ref 39.0–52.0)
HEMOGLOBIN: 13.6 g/dL (ref 13.0–17.0)
MCH: 30.8 pg (ref 26.0–34.0)
MCHC: 32.2 g/dL (ref 30.0–36.0)
MCV: 95.7 fL (ref 78.0–100.0)
Platelets: 149 10*3/uL — ABNORMAL LOW (ref 150–400)
RBC: 4.42 MIL/uL (ref 4.22–5.81)
RDW: 14.8 % (ref 11.5–15.5)
WBC: 7 10*3/uL (ref 4.0–10.5)

## 2017-06-17 LAB — I-STAT TROPONIN, ED: Troponin i, poc: 0.06 ng/mL (ref 0.00–0.08)

## 2017-06-17 LAB — CBG MONITORING, ED: Glucose-Capillary: 83 mg/dL (ref 65–99)

## 2017-06-17 LAB — TROPONIN I: Troponin I: 0.64 ng/mL (ref <0.03)

## 2017-06-17 IMAGING — CR DG CHEST 2V
2 series · 2 of 2 positions shown · non-contrast
Comparison: [DATE]

CLINICAL DATA: Chest pain.

EXAM:
CHEST  2 VIEW

[chest pa]
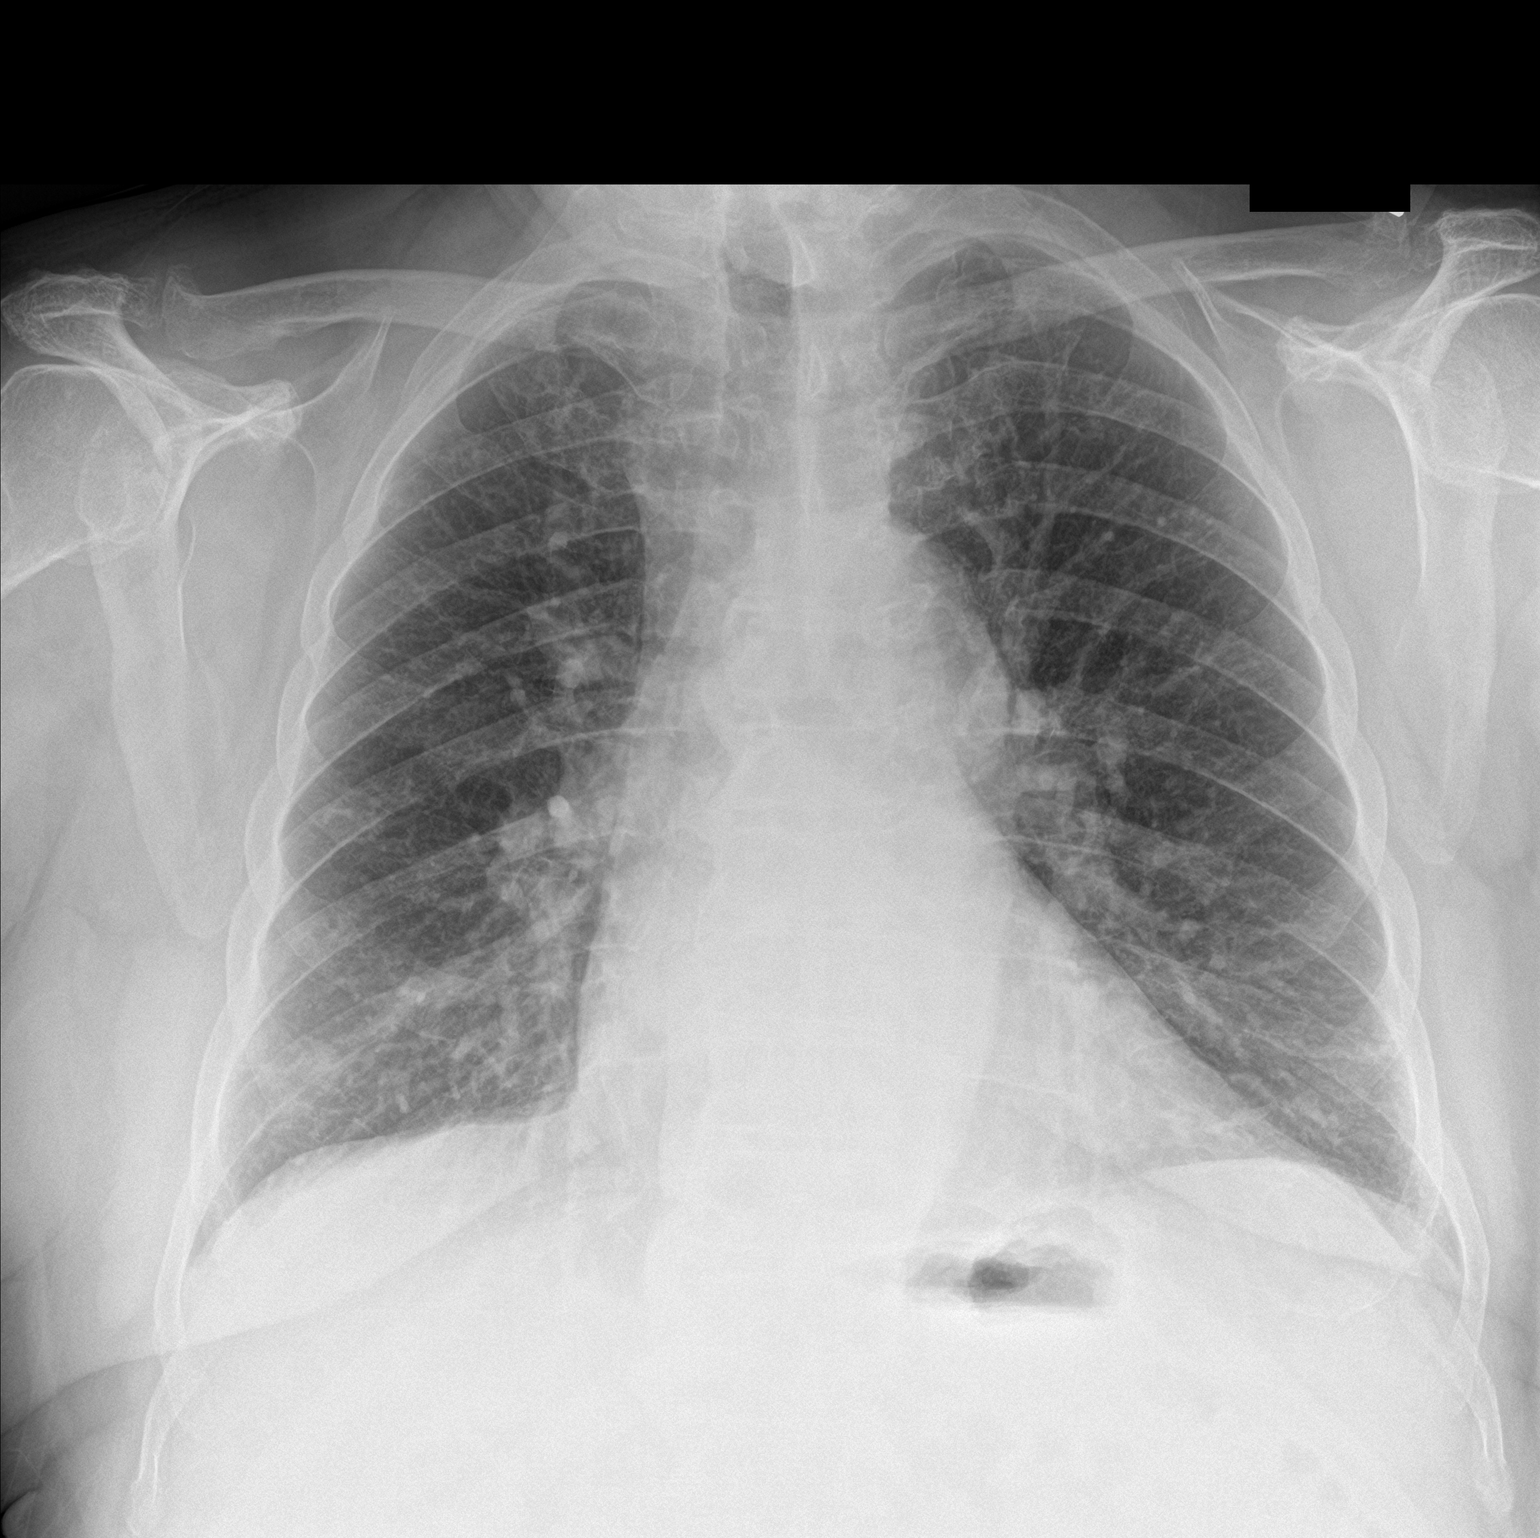

[chest lat]
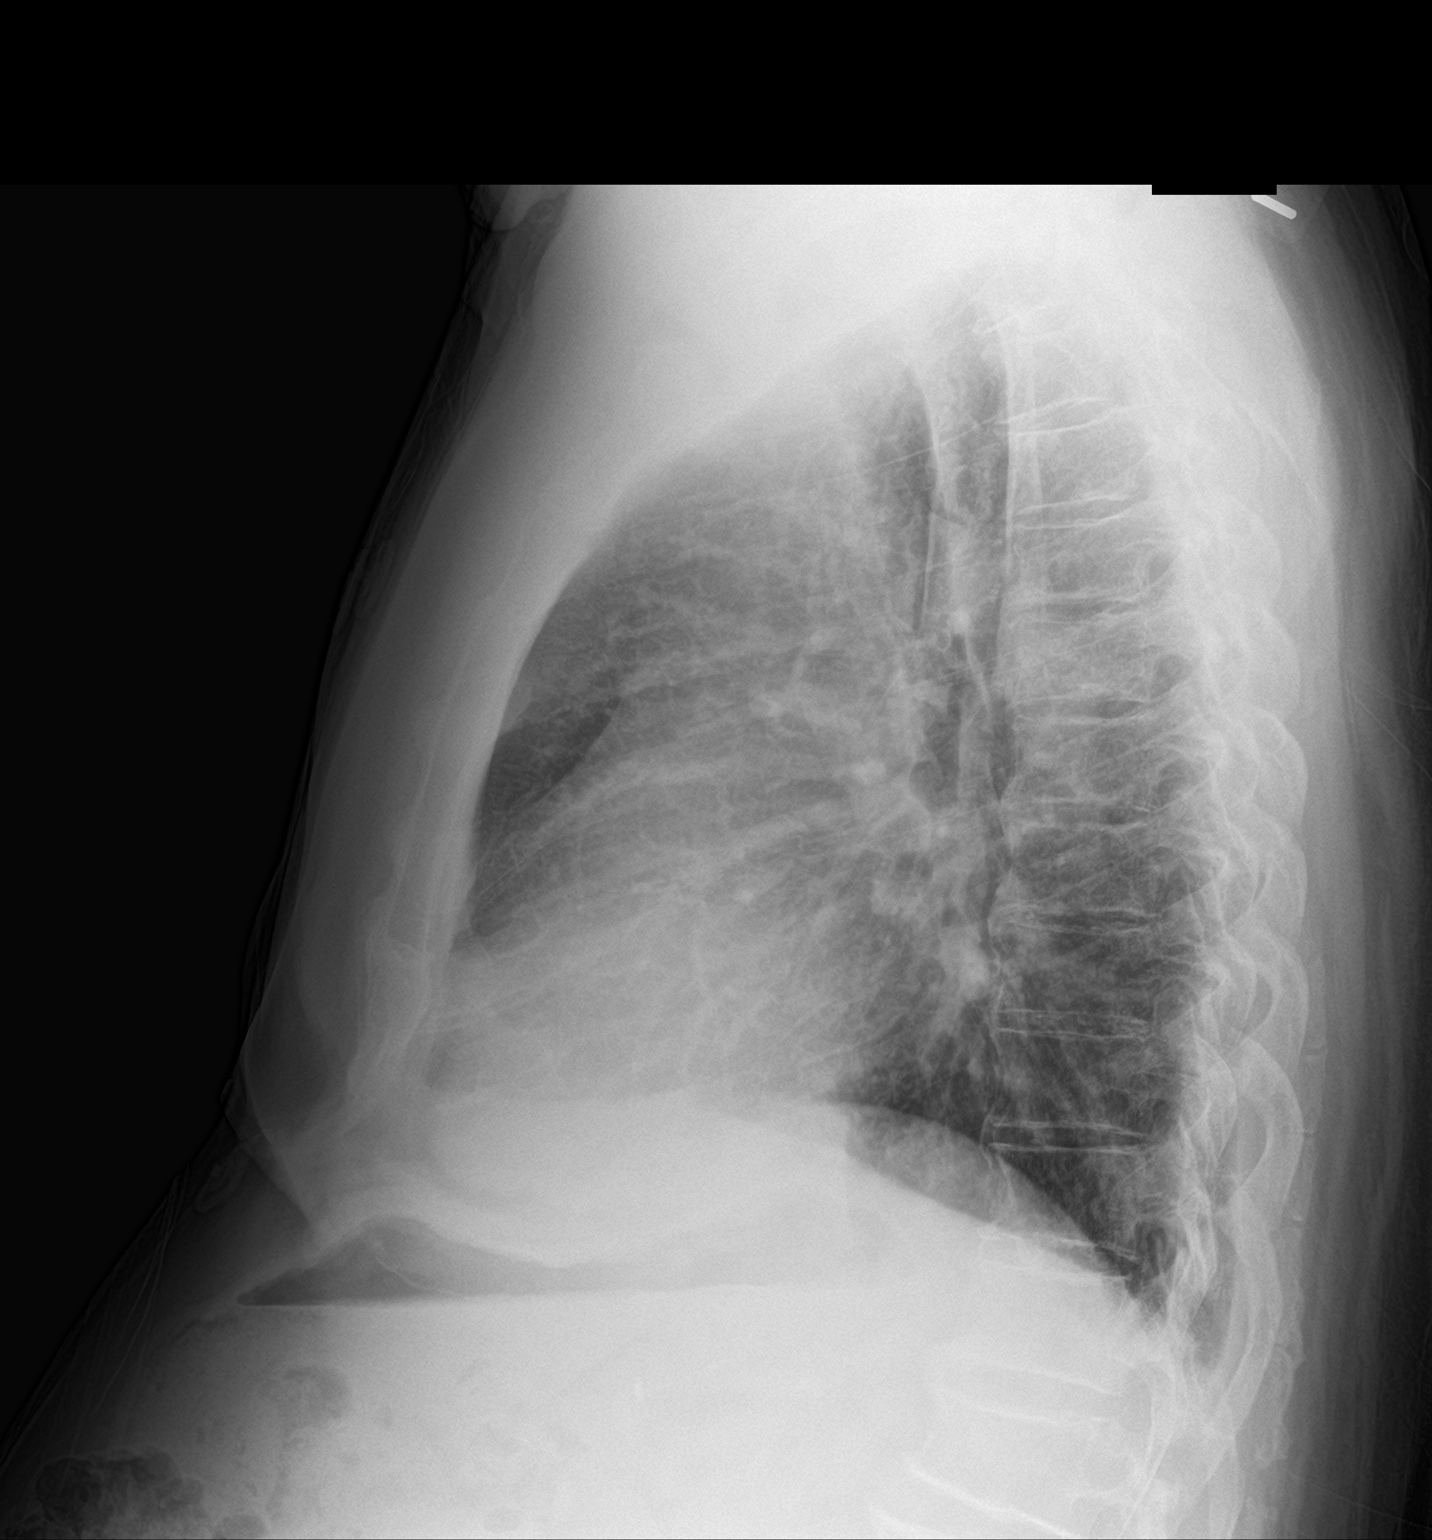

[2 of 2 positions shown; findings below may reference images not displayed]

FINDINGS: Coarse lung markings appear chronic. No evidence for airspace
disease or pulmonary edema. Heart and mediastinum are within normal
limits. There may be small nodular density in the right lower chest
but minimal change since [YB]. Bridging osteophytes in the thoracic
spine. No large pleural effusions. Degenerative changes at the AC
joints bilaterally.
IMPRESSION: No active cardiopulmonary disease.

## 2017-06-17 MED ORDER — INSULIN ASPART 100 UNIT/ML ~~LOC~~ SOLN
0.0000 [IU] | Freq: Three times a day (TID) | SUBCUTANEOUS | Status: DC
  Administered 2017-06-18: 2 [IU] via SUBCUTANEOUS
  Administered 2017-06-18: 1 [IU] via SUBCUTANEOUS
  Administered 2017-06-19 (×2): 2 [IU] via SUBCUTANEOUS
  Administered 2017-06-19: 07:00:00 1 [IU] via SUBCUTANEOUS
  Filled 2017-06-17: qty 1

## 2017-06-17 MED ORDER — INSULIN ASPART 100 UNIT/ML ~~LOC~~ SOLN: 0.0000 [IU] | Freq: Every day | SUBCUTANEOUS | Status: DC

## 2017-06-17 NOTE — H&P (Signed)
Cardiology Admission History and Physical:   Patient ID: Ryan Garcia; MRN: [id number]; DOB: 11-Aug-1933   Admission date: 06/17/2017  Primary Care Provider: Abner Greenspan, MD Primary Cardiologist: Primary Care Provider: Abner Greenspan, MD  Primary Cardiologist: Dr Stanford Breed  Primary Electrophysiologist:  N/A  Chief Complaint:  Chest Pain  Patient Profile:     [name] is a pleasant 83 y.o. male with past medical history of CAD (s/p cath in 2012 showing 70-75% LAD stenosis, 75% D1 stenosis, 75% OM1, 60-70% RCA stenosis, and 80% PDA --> medical therapy pursued, similar findings by cath in 2016),   HTN, HLD, OSA (on CPAP), and Type 2 DM who is seen tonight in the ED with complaints of chest pain.    History of Present Illness:   [name] is a pleasant 83 y.o. male with past medical history of CAD (s/p cath in 2012 showing 70-75% LAD stenosis, 75% D1 stenosis, 75% OM1, 60-70% RCA stenosis, and 80% PDA --> medical therapy pursued, similar findings by cath in 2016),   HTN, HLD, OSA (on CPAP), and Type 2 DM who is seen tonight in the ED with complaints of chest pain.   The pt describes SSCP "tightness" with activity for the past 3 weeks or so. His symptoms have been relieved with rest but the past week he has noted onset of symptoms with less exercise and occasionally at rest.   He tried NTG the last two days which helped. This am he had chest pain after breakfast and called the office. He was instructed to come to the ED for further evaluation. He is pain free here. Mild  Elevation  In Troponin , POC is negative x 1, no acute EKG changes.   Past Medical History:  Diagnosis Date  . AC (acromioclavicular) joint bone spurs    lt ankle  . Allergy    allergic rhinitis  . Arthritis    OA  . BPH (benign prostatic hyperplasia)    microwave tx of prostate  . CAD (coronary artery disease)    a. s/p cath in 2012 showing 70-75% LAD stenosis, 75% D1 stenosis, 75% OM1, 60-70% RCA  stenosis, and 80% PDA --> medical therapy pursued b. similar findings by cath in 2016  . Chronic upper back pain   . Diabetes mellitus    type II x8 years  . GERD (gastroesophageal reflux disease)   . HLD (hyperlipidemia)    10/12: TC 208, TG 213, HDL 36, LDL 129  . Hx of skin cancer, basal cell    many skin cancers removed/under constant treatment.  . Hypertension   . Obstructive sleep apnea on CPAP   . Scoliosis     Past Surgical History:  Procedure Laterality Date  . BREAST SURGERY  2009   breast lump removed benign  . CARDIAC CATHETERIZATION N/A 03/15/2015   Procedure: Left Heart Cath and Coronary Angiography;  Surgeon: Belva Crome, MD;  Location: [facility];  Service: Cardiovascular;  Laterality: N/A;     Medications Prior to Admission: Prior to Admission medications   Medication Sig Start Date End Date Taking? Authorizing Provider  aspirin 81 MG tablet Take 81 mg by mouth daily.     Yes [provider]  aspirin-sod bicarb-citric acid (ALKA-SELTZER) 325 MG TBEF tablet Take 650 mg by mouth every 6 (six) hours as needed (Cough and Cold).   Yes [provider]  cetirizine (ZYRTEC) 10 MG tablet TAKE 1 TABLET EVERY DAY 11/14/16  Yes  Tower, Marne A, MD  Coenzyme Q10 (CO Q 10 PO) Take 1 tablet by mouth daily.    Yes [provider]  Cyanocobalamin (VITAMIN B12 PO) Take 1 tablet by mouth 2 (two) times a week.    Yes [provider]  glipiZIDE (GLUCOTROL) 10 MG tablet TAKE 1 TABLET TWICE DAILY WITH MEALS 01/23/17  Yes Tower, Marne A, MD  hydrochlorothiazide (HYDRODIURIL) 12.5 MG tablet Take 1 tablet (12.5 mg total) daily by mouth. 05/06/17  Yes Strader, [place], PA-C  ibuprofen (ADVIL,MOTRIN) 200 MG tablet Take 200 mg by mouth every 6 (six) hours as needed for mild pain.   Yes [provider]  Insulin Glargine (LANTUS SOLOSTAR) 100 UNIT/ML Solostar Pen INJECT  45 UNITS SUBCUTANEOUSLY AT BEDTIME 04/29/17  Yes Tower, Wynelle Fanny, MD    isosorbide mononitrate (IMDUR) 30 MG 24 hr tablet Take 1 tablet (30 mg total) daily by mouth. 05/06/17  Yes Strader, [place], PA-C  lisinopril (PRINIVIL,ZESTRIL) 10 MG tablet Take 1 tablet (10 mg total) daily by mouth. 05/06/17  Yes Strader, [place], PA-C  metFORMIN (GLUCOPHAGE) 1000 MG tablet Take 1 tablet (1,000 mg total) by mouth 2 (two) times daily. 09/26/16  Yes Tower, Wynelle Fanny, MD  nitroGLYCERIN (NITROSTAT) 0.4 MG SL tablet Place 1 tablet (0.4 mg total) every 5 (five) minutes as needed under the tongue for chest pain. 05/06/17  Yes Strader, [place], PA-C  pravastatin (PRAVACHOL) 80 MG tablet TAKE 1 TABLET EVERY EVENING 06/11/17  Yes Lelon Perla, MD  triamcinolone (NASACORT) 55 MCG/ACT AERO nasal inhaler USE ONE SPRAYS EACH NOSTRIL EVERY DAY as needed 11/26/13  Yes Tower, Wynelle Fanny, MD  Blood Glucose Monitoring Suppl (TRUE METRIX AIR GLUCOSE METER) W/DEVICE KIT 1 kit by Other route 2 (two) times daily. Check blood sugar twice daily and as directed. Dx E11.65 Patient not taking: Reported on 06/17/2017 03/17/15   Tower, Wynelle Fanny, MD  fluorouracil (EFUDEX) 5 % cream Apply 2 application topically daily as needed (skin lesions).  02/23/15   [provider]  glucose blood (TRUE METRIX BLOOD GLUCOSE TEST) test strip CHECK BLOOD SUGAR TWICE DAILY AND AS NEEDED FOR DM (dx. E11.65) Patient not taking: Reported on 06/17/2017 06/13/17   Tower, Wynelle Fanny, MD  Insulin Pen Needle (UNIFINE PENTIPS) 31G X 6 MM MISC USE PEN NEEDLES WHEN TAKING LANTUS AT BEDTIME Patient not taking: Reported on 06/17/2017 11/07/16   Tower, Wynelle Fanny, MD  TRUEPLUS LANCETS 30G MISC Check blood sugar twice daily and as directed. Dx E11.65 Patient not taking: Reported on 06/17/2017 03/17/15   Abner Greenspan, MD     Allergies:    Allergies  Allergen Reactions  . Loratadine     REACTION: not effective  . Simvastatin     REACTION: intolerant    Social History:   Social History   Socioeconomic History  . Marital  status: Married    Spouse name: Not on file  . Number of children: Not on file  . Years of education: Not on file  . Highest education level: Not on file  Social Needs  . Financial resource strain: Not on file  . Food insecurity - worry: Not on file  . Food insecurity - inability: Not on file  . Transportation needs - medical: Not on file  . Transportation needs - non-medical: Not on file  Occupational History  . Not on file  Tobacco Use  . Smoking status: Never Smoker  . Smokeless tobacco: Never Used  Substance  and Sexual Activity  . Alcohol use: No    Alcohol/week: 0.0 oz  . Drug use: No  . Sexual activity: Not on file  Other Topics Concern  . Not on file  Social History Narrative   Married    Physically active hard of hearing    Family History:   The patient's family history includes Heart attack in his brother. There is no history of Colon cancer.    ROS:  Please see the history of present illness.  All other ROS reviewed and negative.     Physical Exam/Data:   Vitals:   06/17/17 1745 06/17/17 1753 06/17/17 1800 06/17/17 1830  BP: 125/69 125/70 137/70 (!) 142/73  Pulse: (!) 58 61 64 (!) 46  Resp: _0 Temp:      TempSrc:      SpO2: 98% 100% 98% 100%  Weight:      Height:       No intake or output data in the 24 hours ending 06/17/17 2137 Filed Weights   06/17/17 1212  Weight: 245 lb (111.1 kg)   Body mass index is 33.23 kg/m.  General:  Well nourished, well developed, in no acute distress HEENT: normal Lymph: no adenopathy Neck: no JVD Endocrine:  No thryomegaly Vascular: No carotid bruits; FA pulses 2+ bilaterally without bruits  Cardiac:  normal S1, S2; RRR; no murmur  Lungs:  clear to auscultation bilaterally, no wheezing, rhonchi or rales  Abd: soft, nontender, no hepatomegaly  Ext: no edema Musculoskeletal:  No deformities, BUE and BLE strength normal and equal Skin: warm and dry  Neuro:  CNs 2-12 intact, no focal abnormalities  noted Psych:  Normal affect    EKG:  The ECG that was done  NSR, No ACS  Features.  Relevant CV Studies:  Conclusion   1. Ost Cx to Mid Cx lesion, 80% stenosed. 2. Ost 1st Mrg to 1st Mrg lesion, 95% stenosed. 3. Mid Cx to Dist Cx lesion, 65% stenosed. 4. 2nd Mrg lesion, 75% stenosed. 5. Prox LAD lesion, 85% stenosed. 6. Prox LAD to Mid LAD lesion, 90% stenosed. 7. Ost 1st Diag to 1st Diag lesion, 85% stenosed. 8. Dist LAD lesion, 65% stenosed. 9. Prox RCA to Mid RCA lesion, 50% stenosed. 10. RPDA lesion, 80% stenosed. 11. 1st RPLB lesion, 70% stenosed.    Significant diffuse coronary disease essentially unchanged from prior angiogram in 2012. Recent high risk myocardial perfusion study which was done after the patient developed dyspnea.  Severe diffuse obstruction in the proximal to mid LAD, severe disease in the ostial to proximal diagonal, severe proximal circumflex disease, diffusely diseased first marginal, high-grade PDA, moderate diffuse disease in the proximal to mid RCA.  Normal systolic left ventricular function with normal end-diastolic pressure.         Laboratory Data:  Chemistry Recent Labs  Lab 06/17/17 1215  NA 141  K 3.9  CL 105  CO2 27  GLUCOSE 269*  BUN 18  CREATININE 1.19  CALCIUM 9.1  GFRNONAA 55*  GFRAA >60  ANIONGAP 9    No results for input(s): PROT, ALBUMIN, AST, ALT, ALKPHOS, BILITOT in the last 168 hours. Hematology Recent Labs  Lab 06/17/17 1215  WBC 7.0  RBC 4.42  HGB 13.6  HCT 42.3  MCV 95.7  MCH 30.8  MCHC 32.2  RDW 14.8  PLT 149*   Cardiac Enzymes Recent Labs  Lab 06/17/17 1843  TROPONINI 0.64*    Recent Labs  Lab 06/17/17 1224  TROPIPOC 0.06    BNPNo results for input(s): BNP, PROBNP in the last 168 hours.  DDimer No results for input(s): DDIMER in the last 168 hours.  Radiology/Studies:  Dg Chest 2 View  Result Date: 06/17/2017 CLINICAL DATA:  Chest pain. EXAM: CHEST  2 VIEW COMPARISON:   05/23/2011 FINDINGS: Coarse lung markings appear chronic. No evidence for airspace disease or pulmonary edema. Heart and mediastinum are within normal limits. There may be small nodular density in the right lower chest but minimal change since 2012. Bridging osteophytes in the thoracic spine. No large pleural effusions. Degenerative changes at the [facility] joints bilaterally. IMPRESSION: No active cardiopulmonary disease. Electronically Signed   By: Markus Daft M.D.   On: 06/17/2017 12:57    Assessment and Plan:   1.Angina  H/o  CAD       82 y.o. male with past medical history of CAD (s/p cath in 2012 showing 70-75% LAD stenosis, 75% D1 stenosis, 75% OM1, 60-70% RCA stenosis, and 80% PDA --> medical therapy pursued, similar findings by cath in 2016  2016 [facility]   12. Ost Cx to Mid Cx lesion, 80% stenosed. 13. Ost 1st Mrg to 1st Mrg lesion, 95% stenosed. 14. Mid Cx to Dist Cx lesion, 65% stenosed. 15. 2nd Mrg lesion, 75% stenosed. 16. Prox LAD lesion, 85% stenosed. 52. Prox LAD to Mid LAD lesion, 90% stenosed. 18. Ost 1st Diag to 1st Diag lesion, 85% stenosed. 78. Dist LAD lesion, 65% stenosed. 20. Prox RCA to Mid RCA lesion, 50% stenosed. 21. RPDA lesion, 80% stenosed. 22. 1st RPLB lesion, 70% stenosed.    Significant diffuse coronary disease essentially unchanged from prior angiogram in 2012. Recent high risk myocardial perfusion study which was done after the patient developed dyspnea.  Severe diffuse obstruction in the proximal to mid LAD, severe disease in the ostial to proximal diagonal, severe proximal circumflex disease, diffusely diseased first marginal, high-grade PDA, moderate diffuse disease in the proximal to mid RCA.  Normal systolic left ventricular function with normal end-diastolic pressure.        EKG TROP  ELEVATED, 0.6  Observe  Trend,   Asymptomatic   increase Imdur to 60 mg daily.  F/U in the office in a week. He is not on a beta blocker secondary to baseline  bradycardia.   Will inform AM cards  Team  Npo , possibly Med  Mgmt  Only due to Chronic  CAD, will also have AM team to trend  Enzymes and  Stratify   Further.  Severity of Illness: The appropriate patient status for this patient is OBSERVATION. Observation status is judged to be reasonable and necessary in order to provide the required intensity of service to ensure the patient's safety. The patient's presenting symptoms, physical exam findings, and initial radiographic and laboratory data in the context of their medical condition is felt to place them at decreased risk for further clinical deterioration. Furthermore, it is anticipated that the patient will be medically stable for discharge from the hospital within 2 midnights of admission. The following factors support the patient status of observation.   " The patient's presenting symptoms include chest pain, . "  " The initial radiographic and laboratory data are   Elevated  Cardiac  enzymes     For questions or updates, please contact [place] Please consult [email or web address] for contact info under Cardiology/STEMI.    Signed, Johna Sheriff, MD  06/17/2017 9:37 PM

## 2017-06-17 NOTE — ED Provider Notes (Signed)
[facility] EMERGENCY DEPARTMENT Provider Note   CSN: [id number] Arrival date & time: 06/17/17  1205     History   Chief Complaint Chief Complaint  Patient presents with  . Chest Pain    HPI Ryan Garcia is a 83 y.o. male.  HPI  Patient with a history of multiple chronic medical problems including coronary artery disease that is not amenable to stenting presenting with chest pain.  He states that over the past month his episodes of chest pain have become more frequent and over the past week he has been taking nitroglycerin which does help to relieve the chest pain.  He had another episode this morning and called cardiology who recommended that he should come to the emergency department.  He describes the chest pain is in the middle of his chest and as a tight squeezing pressure type feeling.  He currently has no chest pain.  He denies any shortness of breath.  No leg swelling.  Symptoms come on with minimal exertion. There are no other associated systemic symptoms, there are no other alleviating or modifying factors.   Past Medical History:  Diagnosis Date  . AC (acromioclavicular) joint bone spurs    lt ankle  . Allergy    allergic rhinitis  . Arthritis    OA  . BPH (benign prostatic hyperplasia)    microwave tx of prostate  . CAD (coronary artery disease)    a. s/p cath in 2012 showing 70-75% LAD stenosis, 75% D1 stenosis, 75% OM1, 60-70% RCA stenosis, and 80% PDA --> medical therapy pursued b. similar findings by cath in 2016  . Chronic upper back pain   . Diabetes mellitus    type II x8 years  . GERD (gastroesophageal reflux disease)   . HLD (hyperlipidemia)    10/12: TC 208, TG 213, HDL 36, LDL 129  . Hx of skin cancer, basal cell    many skin cancers removed/under constant treatment.  . Hypertension   . Obstructive sleep apnea on CPAP   . Scoliosis     Patient Active Problem List   Diagnosis Date Noted  . Actinic keratoses 04/29/2017  .  Positive colorectal cancer screening using Cologuard test 10/31/2016  . BPH (benign prostatic hyperplasia) 09/04/2016  . B12 deficiency 11/22/2015  . Routine general medical examination at a health care facility 08/26/2015  . Abnormal nuclear cardiac imaging test   . Dyspnea on exertion 11/27/2013  . SCC (squamous cell carcinoma), face 11/16/2013  . Encounter for Medicare annual wellness exam 05/29/2013  . Prostate cancer screening 07/15/2012  . Abnormal chest x-ray 05/24/2011  . CAD (coronary artery disease) 05/17/2011  . Nonspecific abnormal results of cardiovascular function study 04/30/2011  . Upper back pain, chronic 04/06/2011  . Abnormal EKG 04/06/2011  . Left arm pain 04/06/2011  . BACK PAIN 01/13/2010  . Diabetes type 2, uncontrolled ([place]) 10/04/2006  . HYPERCHOLESTEROLEMIA 10/04/2006  . Obesity 10/04/2006  . ERECTILE DYSFUNCTION 10/04/2006  . Essential hypertension 10/04/2006  . ALLERGIC RHINITIS 10/04/2006  . GERD 10/04/2006  . OSTEOARTHRITIS 10/04/2006  . Sleep apnea 10/04/2006  . EDEMA 10/04/2006  . SKIN CANCER, HX OF 10/04/2006    Past Surgical History:  Procedure Laterality Date  . BREAST SURGERY  2009   breast lump removed benign  . CARDIAC CATHETERIZATION N/A 03/15/2015   Procedure: Left Heart Cath and Coronary Angiography;  Surgeon: Belva Crome, MD;  Location: [facility];  Service: Cardiovascular;  Laterality: N/A;  Home Medications    Prior to Admission medications   Medication Sig Start Date End Date Taking? Authorizing Provider  aspirin 81 MG tablet Take 81 mg by mouth daily.     Yes [provider]  aspirin-sod bicarb-citric acid (ALKA-SELTZER) 325 MG TBEF tablet Take 650 mg by mouth every 6 (six) hours as needed (Cough and Cold).   Yes [provider]  cetirizine (ZYRTEC) 10 MG tablet TAKE 1 TABLET EVERY DAY 11/14/16  Yes Tower, Marne A, MD  Coenzyme Q10 (CO Q 10 PO) Take 1 tablet by mouth daily.    Yes [provider]  Cyanocobalamin (VITAMIN B12 PO) Take 1 tablet by mouth 2 (two) times a week.    Yes [provider]  glipiZIDE (GLUCOTROL) 10 MG tablet TAKE 1 TABLET TWICE DAILY WITH MEALS 01/23/17  Yes Tower, Marne A, MD  hydrochlorothiazide (HYDRODIURIL) 12.5 MG tablet Take 1 tablet (12.5 mg total) daily by mouth. 05/06/17  Yes Strader, [place], PA-C  ibuprofen (ADVIL,MOTRIN) 200 MG tablet Take 200 mg by mouth every 6 (six) hours as needed for mild pain.   Yes [provider]  Insulin Glargine (LANTUS SOLOSTAR) 100 UNIT/ML Solostar Pen INJECT  45 UNITS SUBCUTANEOUSLY AT BEDTIME 04/29/17  Yes Tower, Wynelle Fanny, MD  isosorbide mononitrate (IMDUR) 30 MG 24 hr tablet Take 1 tablet (30 mg total) daily by mouth. 05/06/17  Yes Strader, [place], PA-C  lisinopril (PRINIVIL,ZESTRIL) 10 MG tablet Take 1 tablet (10 mg total) daily by mouth. 05/06/17  Yes Strader, [place], PA-C  metFORMIN (GLUCOPHAGE) 1000 MG tablet Take 1 tablet (1,000 mg total) by mouth 2 (two) times daily. 09/26/16  Yes Tower, Wynelle Fanny, MD  nitroGLYCERIN (NITROSTAT) 0.4 MG SL tablet Place 1 tablet (0.4 mg total) every 5 (five) minutes as needed under the tongue for chest pain. 05/06/17  Yes Strader, [place], PA-C  pravastatin (PRAVACHOL) 80 MG tablet TAKE 1 TABLET EVERY EVENING 06/11/17  Yes Lelon Perla, MD  triamcinolone (NASACORT) 55 MCG/ACT AERO nasal inhaler USE ONE SPRAYS EACH NOSTRIL EVERY DAY as needed 11/26/13  Yes Tower, Wynelle Fanny, MD  Blood Glucose Monitoring Suppl (TRUE METRIX AIR GLUCOSE METER) W/DEVICE KIT 1 kit by Other route 2 (two) times daily. Check blood sugar twice daily and as directed. Dx E11.65 Patient not taking: Reported on 06/17/2017 03/17/15   Tower, Wynelle Fanny, MD  fluorouracil (EFUDEX) 5 % cream Apply 2 application topically daily as needed (skin lesions).  02/23/15   [provider]  glucose blood (TRUE METRIX BLOOD GLUCOSE TEST) test strip CHECK BLOOD SUGAR TWICE DAILY AND AS NEEDED FOR  DM (dx. E11.65) Patient not taking: Reported on 06/17/2017 06/13/17   Tower, Wynelle Fanny, MD  Insulin Pen Needle (UNIFINE PENTIPS) 31G X 6 MM MISC USE PEN NEEDLES WHEN TAKING LANTUS AT BEDTIME Patient not taking: Reported on 06/17/2017 11/07/16   Tower, Wynelle Fanny, MD  TRUEPLUS LANCETS 30G MISC Check blood sugar twice daily and as directed. Dx E11.65 Patient not taking: Reported on 06/17/2017 03/17/15   Abner Greenspan, MD    Family History Family History  Problem Relation Age of Onset  . Heart attack Brother   . Colon cancer Neg Hx     Social History Social History   Tobacco Use  . Smoking status: Never Smoker  . Smokeless tobacco: Never Used  Substance Use Topics  . Alcohol use: No    Alcohol/week: 0.0 oz  . Drug use: No  Allergies   Loratadine and Simvastatin   Review of Systems Review of Systems  ROS reviewed and all otherwise negative except for mentioned in HPI   Physical Exam Updated Vital Signs BP (!) 142/73   Pulse (!) 46   Temp 98.4 F (36.9 C) (Oral)   Resp 14   Ht 6' (1.829 m)   Wt 111.1 kg (245 lb)   SpO2 100%   BMI 33.23 kg/m  Vitals reviewed Physical Exam  Physical Examination: General appearance - alert, well appearing, and in no distress Mental status - alert, oriented to person, place, and time Eyes - no conjunctival injection, no scleral icterus Mouth - mucous membranes moist, pharynx normal without lesions Neck - supple, no significant adenopathy Chest - clear to auscultation, no wheezes, rales or rhonchi, symmetric air entry Heart - normal rate, regular rhythm, normal S1, S2, no murmurs, rubs, clicks or gallops Abdomen - soft, nontender, nondistended, no masses or organomegaly Neurological - alert, oriented, normal speech Extremities - peripheral pulses normal, no pedal edema, no clubbing or cyanosis Skin - normal coloration and turgor, no rashes   ED Treatments / Results  Labs (all labs ordered are listed, but only abnormal results are  displayed) Labs Reviewed  BASIC METABOLIC PANEL - Abnormal; Notable for the following components:      Result Value   Glucose, Bld 269 (*)    GFR calc non Af Amer 55 (*)    All other components within normal limits  CBC - Abnormal; Notable for the following components:   Platelets 149 (*)    All other components within normal limits  TROPONIN I - Abnormal; Notable for the following components:   Troponin I 0.64 (*)    All other components within normal limits  I-STAT TROPONIN, ED    EKG  EKG Interpretation  Date/Time:  Monday June 17 2017 12:12:28 EST Ventricular Rate:  69 PR Interval:  178 QRS Duration: 90 QT Interval:  390 QTC Calculation: 417 R Axis:   77 Text Interpretation:  Sinus rhythm with occasional Premature ventricular complexes and Premature atrial complexes Nonspecific ST abnormality Abnormal ECG Since previous tracing nonspecific ST changes are new Confirmed by Alfonzo Beers [phone]) on 06/17/2017 5:58:23 PM       Radiology Dg Chest 2 View  Result Date: 06/17/2017 CLINICAL DATA:  Chest pain. EXAM: CHEST  2 VIEW COMPARISON:  05/23/2011 FINDINGS: Coarse lung markings appear chronic. No evidence for airspace disease or pulmonary edema. Heart and mediastinum are within normal limits. There may be small nodular density in the right lower chest but minimal change since 2012. Bridging osteophytes in the thoracic spine. No large pleural effusions. Degenerative changes at the [facility] joints bilaterally. IMPRESSION: No active cardiopulmonary disease. Electronically Signed   By: Markus Daft M.D.   On: 06/17/2017 12:57    Procedures Procedures (including critical care time)  Medications Ordered in ED Medications - No data to display   Initial Impression / Assessment and Plan / ED Course  I have reviewed the triage vital signs and the nursing notes.  Pertinent labs & imaging results that were available during my care of the patient were reviewed by me and considered in  my medical decision making (see chart for details).    6:18 PM d/w Dr. Sallyanne Kuster, cardiology, he will consult on the patient.  6:50 PM  Cardiology recommends d/c home with continued medical management if lab troponin is normal.  Patient is agreeable with this plan.    8:15 PM  lab troponin has resulted elevated at 0.64- repaging cardiology.    8:49 PM  D/w cardiology fellow, he states he will admit patient overnight for obs and serial troponin.     Final Clinical Impressions(s) / ED Diagnoses   Final diagnoses:  NSTEMI (non-ST elevated myocardial infarction) [facility])    ED Discharge Orders    None       Pixie Casino, MD 06/17/17 2127

## 2017-06-17 NOTE — ED Notes (Addendum)
Patient waiting in waiting area

## 2017-06-17 NOTE — ED Notes (Signed)
Pt is resting and appears comfortable.  Family at bedside.  Waiting for delta trop.  Pt denies any cp, sob, or dizziness. Pt is A&Ox 4.  Pt in NAD.

## 2017-06-17 NOTE — Consult Note (Signed)
Cardiology Consultation:   Patient ID: MURPHY DUZAN; [id number]; 1933-08-06   Admit date: 06/17/2017 Date of Consult: 06/17/2017  Primary Care Provider: Abner Greenspan, MD Primary Cardiologist: Dr Stanford Breed   Patient Profile:   Ryan Garcia is a 83 y.o. male with a hx of CAD who is being seen today for the evaluation of chest pain at the request of Dr Marcha Dutton.  History of Present Illness:   Ryan Garcia is a pleasant 83 y.o. male with past medical history of CAD (s/p cath in 2012 showing 70-75% LAD stenosis, 75% D1 stenosis, 75% OM1, 60-70% RCA stenosis, and 80% PDA --> medical therapy pursued, similar findings by cath in 2016), HTN, HLD, OSA (on CPAP), and Type 2 DM who is seen tonight in the ED with complaints of chest pain. The pt describes SSCP "tightness" with activity for the past 3 weeks or so. His symptoms have been relieved with rest but the past week he has noted onset of symptoms with less exercise and occasionally at rest. He tried NTG the last two days which helped. This am he had chest pain after breakfast and called the office. He was instructed to come to the ED for further evaluation. He is pain free here. Troponin POC is negative x 1, no acute EKG changes.   Past Medical History:  Diagnosis Date  . AC (acromioclavicular) joint bone spurs    lt ankle  . Allergy    allergic rhinitis  . Arthritis    OA  . BPH (benign prostatic hyperplasia)    microwave tx of prostate  . CAD (coronary artery disease)    a. s/p cath in 2012 showing 70-75% LAD stenosis, 75% D1 stenosis, 75% OM1, 60-70% RCA stenosis, and 80% PDA --> medical therapy pursued b. similar findings by cath in 2016  . Chronic upper back pain   . Diabetes mellitus    type II x8 years  . GERD (gastroesophageal reflux disease)   . HLD (hyperlipidemia)    10/12: TC 208, TG 213, HDL 36, LDL 129  . Hx of skin cancer, basal cell    many skin cancers removed/under constant treatment.  . Hypertension   . Obstructive  sleep apnea on CPAP   . Scoliosis     Past Surgical History:  Procedure Laterality Date  . BREAST SURGERY  2009   breast lump removed benign  . CARDIAC CATHETERIZATION N/A 03/15/2015   Procedure: Left Heart Cath and Coronary Angiography;  Surgeon: Belva Crome, MD;  Location: [facility];  Service: Cardiovascular;  Laterality: N/A;     Home Medications:  Prior to Admission medications   Medication Sig Start Date End Date Taking? Authorizing Provider  aspirin 81 MG tablet Take 81 mg by mouth daily.     Yes [provider]  aspirin-sod bicarb-citric acid (ALKA-SELTZER) 325 MG TBEF tablet Take 650 mg by mouth every 6 (six) hours as needed (Cough and Cold).   Yes [provider]  cetirizine (ZYRTEC) 10 MG tablet TAKE 1 TABLET EVERY DAY 11/14/16  Yes Tower, Marne A, MD  Coenzyme Q10 (CO Q 10 PO) Take 1 tablet by mouth daily.    Yes [provider]  Cyanocobalamin (VITAMIN B12 PO) Take 1 tablet by mouth 2 (two) times a week.    Yes [provider]  glipiZIDE (GLUCOTROL) 10 MG tablet TAKE 1 TABLET TWICE DAILY WITH MEALS 01/23/17  Yes Tower, Marne A, MD  hydrochlorothiazide (HYDRODIURIL) 12.5 MG tablet Take 1  tablet (12.5 mg total) daily by mouth. 05/06/17  Yes Strader, [place], PA-C  ibuprofen (ADVIL,MOTRIN) 200 MG tablet Take 200 mg by mouth every 6 (six) hours as needed for mild pain.   Yes [provider]  Insulin Glargine (LANTUS SOLOSTAR) 100 UNIT/ML Solostar Pen INJECT  45 UNITS SUBCUTANEOUSLY AT BEDTIME 04/29/17  Yes Tower, Wynelle Fanny, MD  isosorbide mononitrate (IMDUR) 30 MG 24 hr tablet Take 1 tablet (30 mg total) daily by mouth. 05/06/17  Yes Strader, [place], PA-C  lisinopril (PRINIVIL,ZESTRIL) 10 MG tablet Take 1 tablet (10 mg total) daily by mouth. 05/06/17  Yes Strader, [place], PA-C  metFORMIN (GLUCOPHAGE) 1000 MG tablet Take 1 tablet (1,000 mg total) by mouth 2 (two) times daily. 09/26/16  Yes Tower, Wynelle Fanny, MD  nitroGLYCERIN  (NITROSTAT) 0.4 MG SL tablet Place 1 tablet (0.4 mg total) every 5 (five) minutes as needed under the tongue for chest pain. 05/06/17  Yes Strader, [place], PA-C  pravastatin (PRAVACHOL) 80 MG tablet TAKE 1 TABLET EVERY EVENING 06/11/17  Yes Lelon Perla, MD  triamcinolone (NASACORT) 55 MCG/ACT AERO nasal inhaler USE ONE SPRAYS EACH NOSTRIL EVERY DAY as needed 11/26/13  Yes Tower, Wynelle Fanny, MD  Blood Glucose Monitoring Suppl (TRUE METRIX AIR GLUCOSE METER) W/DEVICE KIT 1 kit by Other route 2 (two) times daily. Check blood sugar twice daily and as directed. Dx E11.65 Patient not taking: Reported on 06/17/2017 03/17/15   Tower, Wynelle Fanny, MD  fluorouracil (EFUDEX) 5 % cream Apply 2 application topically daily as needed (skin lesions).  02/23/15   [provider]  glucose blood (TRUE METRIX BLOOD GLUCOSE TEST) test strip CHECK BLOOD SUGAR TWICE DAILY AND AS NEEDED FOR DM (dx. E11.65) Patient not taking: Reported on 06/17/2017 06/13/17   Tower, Wynelle Fanny, MD  Insulin Pen Needle (UNIFINE PENTIPS) 31G X 6 MM MISC USE PEN NEEDLES WHEN TAKING LANTUS AT BEDTIME Patient not taking: Reported on 06/17/2017 11/07/16   Tower, Wynelle Fanny, MD  TRUEPLUS LANCETS 30G MISC Check blood sugar twice daily and as directed. Dx E11.65 Patient not taking: Reported on 06/17/2017 03/17/15   Abner Greenspan, MD    Inpatient Medications: Scheduled Meds:  Continuous Infusions:  PRN Meds:   Allergies:    Allergies  Allergen Reactions  . Loratadine     REACTION: not effective  . Simvastatin     REACTION: intolerant    Social History:   Social History   Socioeconomic History  . Marital status: Married    Spouse name: Not on file  . Number of children: Not on file  . Years of education: Not on file  . Highest education level: Not on file  Social Needs  . Financial resource strain: Not on file  . Food insecurity - worry: Not on file  . Food insecurity - inability: Not on file  . Transportation needs -  medical: Not on file  . Transportation needs - non-medical: Not on file  Occupational History  . Not on file  Tobacco Use  . Smoking status: Never Smoker  . Smokeless tobacco: Never Used  Substance and Sexual Activity  . Alcohol use: No    Alcohol/week: 0.0 oz  . Drug use: No  . Sexual activity: Not on file  Other Topics Concern  . Not on file  Social History Narrative   Married    Physically active hard of hearing    Family History:    Family History  Problem Relation Age  of Onset  . Heart attack Brother   . Colon cancer Neg Hx      ROS:  Please see the history of present illness.  ROS  All other ROS reviewed and negative.     Physical Exam/Data:   Vitals:   06/17/17 1745 06/17/17 1753 06/17/17 1800 06/17/17 1830  BP: 125/69 125/70 137/70 (!) 142/73  Pulse: (!) 58 61 64 (!) 46  Resp: _0 Temp:      TempSrc:      SpO2: 98% 100% 98% 100%  Weight:      Height:       No intake or output data in the 24 hours ending 06/17/17 1903 Filed Weights   06/17/17 1212  Weight: 245 lb (111.1 kg)   Body mass index is 33.23 kg/m.  General:  Well nourished, well developed, in no acute distress HEENT: normal Lymph: no adenopathy Neck: no JVD Endocrine:  No thryomegaly Vascular: No carotid bruits; FA pulses 2+ bilaterally without bruits  Cardiac:  normal S1, S2; RRR; no murmur  Lungs:  clear to auscultation bilaterally, no wheezing, rhonchi or rales  Abd: soft, nontender, no hepatomegaly  Ext: no edema Musculoskeletal:  No deformities, BUE and BLE strength normal and equal Skin: warm and dry  Neuro:  CNs 2-12 intact, no focal abnormalities noted Psych:  Normal affect   EKG:  The EKG was personally reviewed and demonstrates:  NSR with NSST changes   Laboratory Data:  Chemistry Recent Labs  Lab 06/17/17 1215  NA 141  K 3.9  CL 105  CO2 27  GLUCOSE 269*  BUN 18  CREATININE 1.19  CALCIUM 9.1  GFRNONAA 55*  GFRAA >60  ANIONGAP 9    No results for  input(s): PROT, ALBUMIN, AST, ALT, ALKPHOS, BILITOT in the last 168 hours. Hematology Recent Labs  Lab 06/17/17 1215  WBC 7.0  RBC 4.42  HGB 13.6  HCT 42.3  MCV 95.7  MCH 30.8  MCHC 32.2  RDW 14.8  PLT 149*   Cardiac EnzymesNo results for input(s): TROPONINI in the last 168 hours.  Recent Labs  Lab 06/17/17 1224  TROPIPOC 0.06    BNPNo results for input(s): BNP, PROBNP in the last 168 hours.  DDimer No results for input(s): DDIMER in the last 168 hours.  Radiology/Studies:  Dg Chest 2 View  Result Date: 06/17/2017 CLINICAL DATA:  Chest pain. EXAM: CHEST  2 VIEW COMPARISON:  05/23/2011 FINDINGS: Coarse lung markings appear chronic. No evidence for airspace disease or pulmonary edema. Heart and mediastinum are within normal limits. There may be small nodular density in the right lower chest but minimal change since 2012. Bridging osteophytes in the thoracic spine. No large pleural effusions. Degenerative changes at the [facility] joints bilaterally. IMPRESSION: No active cardiopulmonary disease. Electronically Signed   By: Markus Daft M.D.   On: 06/17/2017 12:57    Assessment and Plan:   Chest pain c/w again- Pt has known diffuse CAD- will plan to increase medical Rx.  Essential HTN- Controlled  NIDDM- Followed by PCP  OSA- C-pap compliant  Plan: increase Imdur to 60 mg daily. F/U in the office in a week. He is not on a beta blocker secondary to baseline bradycardia.    For questions or updates, please contact [place] Please consult [email or web address] for contact info under Cardiology/STEMI.   Signed, Kerin Ransom, PA-C  06/17/2017 7:03 PM   I have seen and examined the patient along with Kerin Ransom, PA-C .  I have reviewed the chart, notes and new data.  I agree with PA/NP's note.  Key new complaints: crescendo angina for a few weeks. A couple of episodes at rest, but very brief and promptly responsive to NTG. Currently asymptomatic Key examination changes: obese,  otherwise normal CV exam Key new findings / data: POC troponin normal, ECG with nonspecific changes - subtly different from baseline, but no high risk changes.  PLAN: Recheck troponin. If abnormal, admit and reconsider cath, but he is known to have diffuse disease not amenable to PCI or CABG. If troponin abnormal, would add clopidogrel or Brilinta. If repeat troponin is normal, it is not unreasonable to manage medically as an outpatient. Either way there is room to improve antianginals. Intolerant of beta blockers due to bradycardia in the past. Could increase nitrates and add amlodipine. Mentioned Ranolazine, but he is wary of costly medications.  Sanda Klein, MD, [place] [phone] 06/18/2017, 9:23 AM

## 2017-06-17 NOTE — ED Notes (Signed)
Pt is sitting on the side of the bed to eat and drink.  Family remains at bedside.

## 2017-06-17 NOTE — Telephone Encounter (Signed)
Returned call to patient's wife.She stated husband has been having chest pain this morning.Spoke to patient he stated he is presently having chest pain.Stated he has been having chest pain and pressure in throat,sob for the past 5 to 6 weeks.Advised to go to ED.Trish called.

## 2017-06-17 NOTE — Telephone Encounter (Signed)
New Message   Pt c/o of Chest Pain: STAT if CP now or developed within 24 hours  1. Are you having CP right now? yes  2. Are you experiencing any other symptoms (ex. SOB, nausea, vomiting, sweating)? SOB  3. How long have you been experiencing CP? On and off since last week   4. Is your CP continuous or coming and going? Coming and going  5. Have you taken Nitroglycerin? Yes, took 2 pills yesterday  ?

## 2017-06-17 NOTE — ED Triage Notes (Signed)
Pt reports CP, under his throat, x 1 month intermittently. Has been using nitro and it has been helping. This morning he was sitting there reading the paper, got an intense pain in his chest, relieved on his own. Denies n/v. Feels sob, exertion exacerbates the pain. The pain is pressure, "chokey", feels like a belch could relieve it. Has 9 known blockages in his heart, they aren't able to stent them. Dr. Stanford Breed is his doctor.

## 2017-06-18 ENCOUNTER — Encounter (HOSPITAL_COMMUNITY)
Admission: EM | Disposition: A | Discharge status: Home / Self Care | Payer: Self-pay | Source: Home / Self Care | Attending: Cardiology

## 2017-06-18 ENCOUNTER — Other Ambulatory Visit: Payer: Self-pay

## 2017-06-18 DIAGNOSIS — Z8249 Family history of ischemic heart disease and other diseases of the circulatory system: Secondary | ICD-10-CM | POA: Diagnosis not present

## 2017-06-18 DIAGNOSIS — E119 Type 2 diabetes mellitus without complications: Secondary | ICD-10-CM | POA: Diagnosis present

## 2017-06-18 DIAGNOSIS — Z79899 Other long term (current) drug therapy: Secondary | ICD-10-CM | POA: Diagnosis not present

## 2017-06-18 DIAGNOSIS — I214 Non-ST elevation (NSTEMI) myocardial infarction: Secondary | ICD-10-CM

## 2017-06-18 DIAGNOSIS — R079 Chest pain, unspecified: Secondary | ICD-10-CM | POA: Diagnosis present

## 2017-06-18 DIAGNOSIS — G4733 Obstructive sleep apnea (adult) (pediatric): Secondary | ICD-10-CM | POA: Diagnosis present

## 2017-06-18 DIAGNOSIS — I10 Essential (primary) hypertension: Secondary | ICD-10-CM | POA: Diagnosis present

## 2017-06-18 DIAGNOSIS — I2510 Atherosclerotic heart disease of native coronary artery without angina pectoris: Secondary | ICD-10-CM

## 2017-06-18 DIAGNOSIS — Z6833 Body mass index (BMI) 33.0-33.9, adult: Secondary | ICD-10-CM | POA: Diagnosis not present

## 2017-06-18 DIAGNOSIS — E7800 Pure hypercholesterolemia, unspecified: Secondary | ICD-10-CM | POA: Diagnosis present

## 2017-06-18 DIAGNOSIS — I361 Nonrheumatic tricuspid (valve) insufficiency: Secondary | ICD-10-CM | POA: Diagnosis not present

## 2017-06-18 DIAGNOSIS — Z7982 Long term (current) use of aspirin: Secondary | ICD-10-CM | POA: Diagnosis not present

## 2017-06-18 DIAGNOSIS — K219 Gastro-esophageal reflux disease without esophagitis: Secondary | ICD-10-CM | POA: Diagnosis present

## 2017-06-18 DIAGNOSIS — I25110 Atherosclerotic heart disease of native coronary artery with unstable angina pectoris: Secondary | ICD-10-CM | POA: Diagnosis present

## 2017-06-18 DIAGNOSIS — E669 Obesity, unspecified: Secondary | ICD-10-CM | POA: Diagnosis present

## 2017-06-18 DIAGNOSIS — N400 Enlarged prostate without lower urinary tract symptoms: Secondary | ICD-10-CM | POA: Diagnosis present

## 2017-06-18 DIAGNOSIS — Z85828 Personal history of other malignant neoplasm of skin: Secondary | ICD-10-CM | POA: Diagnosis not present

## 2017-06-18 DIAGNOSIS — Z794 Long term (current) use of insulin: Secondary | ICD-10-CM | POA: Diagnosis not present

## 2017-06-18 HISTORY — PX: ULTRASOUND GUIDANCE FOR VASCULAR ACCESS: SHX6516

## 2017-06-18 HISTORY — PX: CORONARY STENT INTERVENTION: CATH118234

## 2017-06-18 HISTORY — DX: Non-ST elevation (NSTEMI) myocardial infarction: I21.4

## 2017-06-18 HISTORY — PX: LEFT HEART CATH AND CORONARY ANGIOGRAPHY: CATH118249

## 2017-06-18 LAB — BASIC METABOLIC PANEL
Anion gap: 8 (ref 5–15)
BUN: 17 mg/dL (ref 6–20)
CALCIUM: 9 mg/dL (ref 8.9–10.3)
CO2: 25 mmol/L (ref 22–32)
Chloride: 105 mmol/L (ref 101–111)
Creatinine, Ser: 1.15 mg/dL (ref 0.61–1.24)
GFR calc Af Amer: >60 mL/min (ref >60)
GFR, EST NON AFRICAN AMERICAN: 58 mL/min — AB (ref >60)
GLUCOSE: 139 mg/dL — AB (ref 65–99)
Potassium: 4 mmol/L (ref 3.5–5.1)
SODIUM: 138 mmol/L (ref 135–145)

## 2017-06-18 LAB — CBC
HEMATOCRIT: 38.7 % — AB (ref 39.0–52.0)
HEMOGLOBIN: 12.8 g/dL — AB (ref 13.0–17.0)
MCH: 31.1 pg (ref 26.0–34.0)
MCHC: 33.1 g/dL (ref 30.0–36.0)
MCV: 94.2 fL (ref 78.0–100.0)
Platelets: 133 10*3/uL — ABNORMAL LOW (ref 150–400)
RBC: 4.11 MIL/uL — AB (ref 4.22–5.81)
RDW: 14.6 % (ref 11.5–15.5)
WBC: 7.6 10*3/uL (ref 4.0–10.5)

## 2017-06-18 LAB — CK TOTAL AND CKMB (NOT AT ARMC)
CK, MB: 2.2 ng/mL (ref 0.5–5.0)
Relative Index: INVALID (ref 0.0–2.5)
Total CK: 58 U/L (ref 49–397)

## 2017-06-18 LAB — POCT ACTIVATED CLOTTING TIME: Activated Clotting Time: 400 seconds

## 2017-06-18 LAB — TROPONIN I: Troponin I: 0.48 ng/mL (ref <0.03)

## 2017-06-18 LAB — GLUCOSE, CAPILLARY
GLUCOSE-CAPILLARY: 141 mg/dL — AB (ref 65–99)
Glucose-Capillary: 127 mg/dL — ABNORMAL HIGH (ref 65–99)

## 2017-06-18 LAB — CREATININE, SERUM
Creatinine, Ser: 1.15 mg/dL (ref 0.61–1.24)
GFR calc non Af Amer: 58 mL/min — ABNORMAL LOW (ref >60)

## 2017-06-18 LAB — CBG MONITORING, ED
GLUCOSE-CAPILLARY: 167 mg/dL — AB (ref 65–99)
Glucose-Capillary: 120 mg/dL — ABNORMAL HIGH (ref 65–99)

## 2017-06-18 LAB — PROTIME-INR
INR: 1.27
PROTHROMBIN TIME: 15.8 s — AB (ref 11.4–15.2)

## 2017-06-18 SURGERY — LEFT HEART CATH AND CORONARY ANGIOGRAPHY
Anesthesia: LOCAL

## 2017-06-18 MED ORDER — TICAGRELOR 90 MG PO TABS
ORAL_TABLET | ORAL | Status: DC | PRN
  Administered 2017-06-18: 180 mg via ORAL

## 2017-06-18 MED ORDER — HEPARIN SODIUM (PORCINE) 5000 UNIT/ML IJ SOLN
5000.0000 [IU] | Freq: Three times a day (TID) | INTRAMUSCULAR | Status: DC
  Administered 2017-06-18: 5000 [IU] via SUBCUTANEOUS
  Filled 2017-06-18: qty 1

## 2017-06-18 MED ORDER — SODIUM CHLORIDE 0.9% FLUSH
3.0000 mL | Freq: Two times a day (BID) | INTRAVENOUS | Status: DC
  Administered 2017-06-19: 11:00:00 3 mL via INTRAVENOUS

## 2017-06-18 MED ORDER — ASPIRIN 300 MG RE SUPP: 300.0000 mg | RECTAL | Status: AC

## 2017-06-18 MED ORDER — ASPIRIN 81 MG PO TABS: 81.0000 mg | ORAL_TABLET | Freq: Every day | ORAL | Status: DC

## 2017-06-18 MED ORDER — ASPIRIN 81 MG PO CHEW: 81.0000 mg | CHEWABLE_TABLET | ORAL | Status: DC

## 2017-06-18 MED ORDER — FENTANYL CITRATE (PF) 100 MCG/2ML IJ SOLN
INTRAMUSCULAR | Status: DC | PRN
  Administered 2017-06-18: 25 ug via INTRAVENOUS

## 2017-06-18 MED ORDER — ASPIRIN 81 MG PO CHEW
324.0000 mg | CHEWABLE_TABLET | ORAL | Status: AC
  Administered 2017-06-18: 324 mg via ORAL
  Filled 2017-06-18: qty 4

## 2017-06-18 MED ORDER — ONDANSETRON HCL 4 MG/2ML IJ SOLN
4.0000 mg | Freq: Four times a day (QID) | INTRAMUSCULAR | Status: DC | PRN
  Administered 2017-06-18: 22:00:00 4 mg via INTRAVENOUS
  Filled 2017-06-18: qty 2

## 2017-06-18 MED ORDER — TICAGRELOR 90 MG PO TABS
90.0000 mg | ORAL_TABLET | Freq: Two times a day (BID) | ORAL | Status: DC
  Administered 2017-06-19: 90 mg via ORAL
  Filled 2017-06-18: qty 1

## 2017-06-18 MED ORDER — ISOSORBIDE MONONITRATE ER 60 MG PO TB24
60.0000 mg | ORAL_TABLET | Freq: Every day | ORAL | Status: DC
  Administered 2017-06-18 – 2017-06-19 (×2): 60 mg via ORAL
  Filled 2017-06-18: qty 1
  Filled 2017-06-18: qty 2

## 2017-06-18 MED ORDER — SODIUM CHLORIDE 0.9 % WEIGHT BASED INFUSION
1.0000 mL/kg/h | INTRAVENOUS | Status: DC
  Administered 2017-06-18: 250 mL via INTRAVENOUS

## 2017-06-18 MED ORDER — NITROGLYCERIN 0.4 MG SL SUBL: 0.4000 mg | SUBLINGUAL_TABLET | SUBLINGUAL | Status: DC | PRN

## 2017-06-18 MED ORDER — HYDRALAZINE HCL 20 MG/ML IJ SOLN: 5.0000 mg | INTRAMUSCULAR | Status: DC | PRN

## 2017-06-18 MED ORDER — SODIUM CHLORIDE 0.9 % IV SOLN
INTRAVENOUS | Status: AC | PRN
  Administered 2017-06-18: 1.75 mg/kg/h via INTRAVENOUS
  Administered 2017-06-18: 1.75 mg/kg/h

## 2017-06-18 MED ORDER — ACTIVE PARTNERSHIP FOR HEALTH OF YOUR HEART BOOK
Freq: Once | Status: AC
  Administered 2017-06-18: 22:00:00 1
  Filled 2017-06-18: qty 1

## 2017-06-18 MED ORDER — ACETAMINOPHEN 325 MG PO TABS: 650.0000 mg | ORAL_TABLET | ORAL | Status: DC | PRN

## 2017-06-18 MED ORDER — ANGIOPLASTY BOOK
Freq: Once | Status: AC
  Administered 2017-06-18: 22:00:00 1
  Filled 2017-06-18: qty 1

## 2017-06-18 MED ORDER — HEPARIN (PORCINE) IN NACL 2-0.9 UNIT/ML-% IJ SOLN
INTRAMUSCULAR | Status: AC
  Filled 2017-06-18: qty 1000

## 2017-06-18 MED ORDER — TICAGRELOR 90 MG PO TABS
ORAL_TABLET | ORAL | Status: AC
  Filled 2017-06-18: qty 2

## 2017-06-18 MED ORDER — ASPIRIN 81 MG PO CHEW: 81.0000 mg | CHEWABLE_TABLET | Freq: Every day | ORAL | Status: DC

## 2017-06-18 MED ORDER — SODIUM CHLORIDE 0.9 % IV SOLN
1.7500 mg/kg/h | INTRAVENOUS | Status: AC
  Filled 2017-06-18: qty 250

## 2017-06-18 MED ORDER — SODIUM CHLORIDE 0.9 % IV SOLN: 250.0000 mL | INTRAVENOUS | Status: DC | PRN

## 2017-06-18 MED ORDER — HEPARIN (PORCINE) IN NACL 100-0.45 UNIT/ML-% IJ SOLN
1300.0000 [IU]/h | INTRAMUSCULAR | Status: DC
  Administered 2017-06-18: 1300 [IU]/h via INTRAVENOUS
  Filled 2017-06-18: qty 250

## 2017-06-18 MED ORDER — BIVALIRUDIN BOLUS VIA INFUSION - CUPID
INTRAVENOUS | Status: DC | PRN
  Administered 2017-06-18: 85.65 mg via INTRAVENOUS

## 2017-06-18 MED ORDER — ATROPINE SULFATE 1 MG/10ML IJ SOSY
PREFILLED_SYRINGE | INTRAMUSCULAR | Status: AC
  Filled 2017-06-18: qty 10

## 2017-06-18 MED ORDER — IOPAMIDOL (ISOVUE-370) INJECTION 76%
INTRAVENOUS | Status: AC
  Filled 2017-06-18: qty 100

## 2017-06-18 MED ORDER — ASPIRIN EC 81 MG PO TBEC
81.0000 mg | DELAYED_RELEASE_TABLET | Freq: Every day | ORAL | Status: DC
  Administered 2017-06-18 – 2017-06-19 (×2): 81 mg via ORAL
  Filled 2017-06-18 (×2): qty 1

## 2017-06-18 MED ORDER — FENTANYL CITRATE (PF) 100 MCG/2ML IJ SOLN
INTRAMUSCULAR | Status: AC
  Filled 2017-06-18: qty 2

## 2017-06-18 MED ORDER — PRAVASTATIN SODIUM 40 MG PO TABS
80.0000 mg | ORAL_TABLET | Freq: Every evening | ORAL | Status: DC
  Administered 2017-06-18: 80 mg via ORAL
  Filled 2017-06-18: qty 2

## 2017-06-18 MED ORDER — SODIUM CHLORIDE 0.9% FLUSH: 3.0000 mL | INTRAVENOUS | Status: DC | PRN

## 2017-06-18 MED ORDER — LIDOCAINE HCL (PF) 1 % IJ SOLN
INTRAMUSCULAR | Status: DC | PRN
Start: 2017-06-18 — End: 2017-06-18
  Administered 2017-06-18: 5 mL
  Administered 2017-06-18: 22 mL

## 2017-06-18 MED ORDER — IOPAMIDOL (ISOVUE-370) INJECTION 76%
INTRAVENOUS | Status: DC | PRN
  Administered 2017-06-18: 80 mL via INTRA_ARTERIAL

## 2017-06-18 MED ORDER — BIVALIRUDIN TRIFLUOROACETATE 250 MG IV SOLR
INTRAVENOUS | Status: AC
  Filled 2017-06-18: qty 250

## 2017-06-18 MED ORDER — VERAPAMIL HCL 2.5 MG/ML IV SOLN
INTRAVENOUS | Status: AC
  Filled 2017-06-18: qty 2

## 2017-06-18 MED ORDER — ONDANSETRON HCL 4 MG/2ML IJ SOLN: 4.0000 mg | Freq: Four times a day (QID) | INTRAMUSCULAR | Status: DC | PRN

## 2017-06-18 MED ORDER — LABETALOL HCL 5 MG/ML IV SOLN: 10.0000 mg | INTRAVENOUS | Status: DC | PRN

## 2017-06-18 MED ORDER — MIDAZOLAM HCL 2 MG/2ML IJ SOLN
INTRAMUSCULAR | Status: AC
  Filled 2017-06-18: qty 2

## 2017-06-18 MED ORDER — MIDAZOLAM HCL 2 MG/2ML IJ SOLN
INTRAMUSCULAR | Status: DC | PRN
  Administered 2017-06-18: 1 mg via INTRAVENOUS

## 2017-06-18 MED ORDER — TICAGRELOR 90 MG PO TABS
90.0000 mg | ORAL_TABLET | Freq: Once | ORAL | Status: AC
  Administered 2017-06-19: 07:00:00 90 mg via ORAL
  Filled 2017-06-18: qty 1

## 2017-06-18 MED ORDER — SODIUM CHLORIDE 0.9% FLUSH: 3.0000 mL | Freq: Two times a day (BID) | INTRAVENOUS | Status: DC

## 2017-06-18 MED ORDER — LIDOCAINE HCL (PF) 1 % IJ SOLN
INTRAMUSCULAR | Status: AC
  Filled 2017-06-18: qty 30

## 2017-06-18 MED ORDER — HEPARIN BOLUS VIA INFUSION
4000.0000 [IU] | Freq: Once | INTRAVENOUS | Status: AC
  Administered 2017-06-18: 4000 [IU] via INTRAVENOUS
  Filled 2017-06-18: qty 4000

## 2017-06-18 MED ORDER — HYDROCHLOROTHIAZIDE 25 MG PO TABS
12.5000 mg | ORAL_TABLET | Freq: Every day | ORAL | Status: DC
  Administered 2017-06-18: 12.5 mg via ORAL
  Filled 2017-06-18: qty 1

## 2017-06-18 MED ORDER — METOPROLOL TARTRATE 12.5 MG HALF TABLET
12.5000 mg | ORAL_TABLET | Freq: Two times a day (BID) | ORAL | Status: DC
  Administered 2017-06-18 – 2017-06-19 (×4): 12.5 mg via ORAL
  Filled 2017-06-18: qty 0.5
  Filled 2017-06-18 (×3): qty 1

## 2017-06-18 MED ORDER — SODIUM CHLORIDE 0.9 % IV SOLN: INTRAVENOUS | Status: DC

## 2017-06-18 MED ORDER — SODIUM CHLORIDE 0.9 % WEIGHT BASED INFUSION
3.0000 mL/kg/h | INTRAVENOUS | Status: DC
Start: 2017-06-18 — End: 2017-06-18
  Administered 2017-06-18: 3 mL/kg/h via INTRAVENOUS

## 2017-06-18 MED ORDER — HEART ATTACK BOUNCING BOOK
Freq: Once | Status: AC
  Administered 2017-06-18: 1
  Filled 2017-06-18: qty 1

## 2017-06-18 SURGICAL SUPPLY — 19 items
BALLN SAPPHIRE 2.5X12 (BALLOONS) ×3
BALLN ~~LOC~~ EMERGE MR 3.25X8 (BALLOONS) ×3
BALLOON SAPPHIRE 2.5X12 (BALLOONS) ×2 IMPLANT
BALLOON ~~LOC~~ EMERGE MR 3.25X8 (BALLOONS) ×2 IMPLANT
CATH INFINITI 5FR MULTPACK ANG (CATHETERS) ×3 IMPLANT
CATH LAUNCHER 6FR JR4 (CATHETERS) ×3 IMPLANT
GLIDESHEATH SLEND SS 6F .021 (SHEATH) ×3 IMPLANT
GUIDEWIRE INQWIRE 1.5J.035X260 (WIRE) ×2 IMPLANT
INQWIRE 1.5J .035X260CM (WIRE) ×3
KIT ENCORE 26 ADVANTAGE (KITS) ×3 IMPLANT
KIT HEART LEFT (KITS) ×3 IMPLANT
PACK CARDIAC CATHETERIZATION (CUSTOM PROCEDURE TRAY) ×3 IMPLANT
SHEATH PINNACLE 5F 10CM (SHEATH) ×3 IMPLANT
SHEATH PINNACLE 6F 10CM (SHEATH) ×3 IMPLANT
STENT SYNERGY DES 3X12 (Permanent Stent) ×3 IMPLANT
TRANSDUCER W/STOPCOCK (MISCELLANEOUS) ×3 IMPLANT
TUBING CIL FLEX 10 FLL-RA (TUBING) ×3 IMPLANT
WIRE ASAHI PROWATER 180CM (WIRE) ×3 IMPLANT
WIRE EMERALD 3MM-J .035X150CM (WIRE) ×3 IMPLANT

## 2017-06-18 NOTE — Progress Notes (Signed)
Site area: right groin  Site Prior to Removal:  Level 0  Pressure Applied For 25 MINUTES    Minutes Beginning at 22:35  Manual:   Yes.    Patient Status During Pull:  WNL  Post Pull Groin Site:  Level 0  Post Pull Instructions Given:  Yes.    Post Pull Pulses Present:  Yes.    Dressing Applied:  Yes.    Comments:  Pt tolerated well.

## 2017-06-18 NOTE — Progress Notes (Signed)
Progress Note  Patient Name: Ryan Garcia Date of Encounter: 06/18/2017  Primary Cardiologist: Dr Stanford Breed  Subjective   No CP or dyspnea  Inpatient Medications    Scheduled Meds: . aspirin EC  81 mg Oral Daily  . heparin  5,000 Units Subcutaneous Q8H  . hydrochlorothiazide  12.5 mg Oral Daily  . insulin aspart  0-5 Units Subcutaneous QHS  . insulin aspart  0-9 Units Subcutaneous TID WC  . pravastatin  80 mg Oral QPM   Continuous Infusions:  PRN Meds: acetaminophen, acetaminophen, nitroGLYCERIN, ondansetron (ZOFRAN) IV, ondansetron (ZOFRAN) IV   Vital Signs    Vitals:   06/18/17 0815 06/18/17 0900 06/18/17 1000 06/18/17 1030  BP:  135/64 129/71 135/75  Pulse:  61 60 60  Resp: 15 16 16 16   Temp:      TempSrc:      SpO2:  96% 92% 96%  Weight:      Height:       No intake or output data in the 24 hours ending 06/18/17 1100 Filed Weights   06/17/17 1212  Weight: 245 lb (111.1 kg)    Physical Exam   GEN: WD, obese No acute distress.   Neck: No JVD Cardiac: RRR, no murmurs, rubs, or gallops.  Respiratory: Clear to auscultation bilaterally. GI: Soft, nontender, non-distended  MS: No edema Neuro:  Nonfocal  Psych: Normal affect   Labs    Chemistry Recent Labs  Lab 06/17/17 1215 06/18/17 0208  NA 141  --   K 3.9  --   CL 105  --   CO2 27  --   GLUCOSE 269*  --   BUN 18  --   CREATININE 1.19 1.15  CALCIUM 9.1  --   GFRNONAA 55* 58*  GFRAA >60 >60  ANIONGAP 9  --      Hematology Recent Labs  Lab 06/17/17 1215 06/18/17 0208  WBC 7.0 7.6  RBC 4.42 4.11*  HGB 13.6 12.8*  HCT 42.3 38.7*  MCV 95.7 94.2  MCH 30.8 31.1  MCHC 32.2 33.1  RDW 14.8 14.6  PLT 149* 133*    Cardiac Enzymes Recent Labs  Lab 06/17/17 1843 06/18/17 0208  TROPONINI 0.64* 0.48*    Recent Labs  Lab 06/17/17 1224  TROPIPOC 0.06     Radiology    Dg Chest 2 View  Result Date: 06/17/2017 CLINICAL DATA:  Chest pain. EXAM: CHEST  2 VIEW COMPARISON:   05/23/2011 FINDINGS: Coarse lung markings appear chronic. No evidence for airspace disease or pulmonary edema. Heart and mediastinum are within normal limits. There may be small nodular density in the right lower chest but minimal change since 2012. Bridging osteophytes in the thoracic spine. No large pleural effusions. Degenerative changes at the [facility] joints bilaterally. IMPRESSION: No active cardiopulmonary disease. Electronically Signed   By: Markus Daft M.D.   On: 06/17/2017 12:57     Patient Profile     83 y.o. male with past medical history of severe coronary disease admitted with non-ST elevation myocardial infarction.  Assessment & Plan    1 non-ST elevation myocardial infarction-patient is pain-free this morning.  His presenting symptoms are classic for unstable angina/non-ST elevation myocardial infarction.  The symptoms are new over the past 1 month and it is likely that his coronary artery disease has progressed.  He is now willing to proceed with coronary artery bypass and graft if needed.  We will proceed with cardiac catheterization.  The risks and benefits including myocardial  infarction, CVA and death discussed and he agrees to proceed.  Continue aspirin and statin.  Add IV heparin and low-dose metoprolol. Resume imdur.  2 hypertension-I will hold HCTZ prior to catheterization but resume after.  Follow blood pressure and adjust regimen as needed.  3 hyperlipidemia-continue pravastatin.  He did not tolerate Lipitor in the past.  For questions or updates, please contact [place] Please consult [email or web address] for contact info under Cardiology/STEMI.      Signed, Kirk Ruths, MD  06/18/2017, 11:00 AM

## 2017-06-18 NOTE — Progress Notes (Signed)
ANTICOAGULATION CONSULT NOTE - Initial Consult  Pharmacy Consult for heparin Indication: chest pain/ACS  Allergies  Allergen Reactions  . Loratadine     REACTION: not effective  . Simvastatin     REACTION: intolerant    Patient Measurements: Height: 6' (182.9 cm) Weight: 245 lb (111.1 kg) IBW/kg (Calculated) : 77.6 Heparin Dosing Weight: 101 kg  Vital Signs: BP: 135/75 (12/18 1030) Pulse Rate: 60 (12/18 1030)  Labs: Recent Labs    06/17/17 1215 06/17/17 1843 06/18/17 0208  HGB 13.6  --  12.8*  HCT 42.3  --  38.7*  PLT 149*  --  133*  CREATININE 1.19  --  1.15  TROPONINI  --  0.64* 0.48*    Estimated Creatinine Clearance: 64.8 mL/min (by C-G formula based on SCr of 1.15 mg/dL).   Medical History: Past Medical History:  Diagnosis Date  . AC (acromioclavicular) joint bone spurs    lt ankle  . Allergy    allergic rhinitis  . Arthritis    OA  . BPH (benign prostatic hyperplasia)    microwave tx of prostate  . CAD (coronary artery disease)    a. s/p cath in 2012 showing 70-75% LAD stenosis, 75% D1 stenosis, 75% OM1, 60-70% RCA stenosis, and 80% PDA --> medical therapy pursued b. similar findings by cath in 2016  . Chronic upper back pain   . Diabetes mellitus    type II x8 years  . GERD (gastroesophageal reflux disease)   . HLD (hyperlipidemia)    10/12: TC 208, TG 213, HDL 36, LDL 129  . Hx of skin cancer, basal cell    many skin cancers removed/under constant treatment.  . Hypertension   . Obstructive sleep apnea on CPAP   . Scoliosis     Medications:  Scheduled:  . aspirin EC  81 mg Oral Daily  . heparin  4,000 Units Intravenous Once  . insulin aspart  0-5 Units Subcutaneous QHS  . insulin aspart  0-9 Units Subcutaneous TID WC  . isosorbide mononitrate  60 mg Oral Daily  . metoprolol tartrate  12.5 mg Oral BID  . pravastatin  80 mg Oral QPM   Infusions:  . heparin      Assessment: 83 y/o male with history of CAD, 9 known blockages that were  unable to be stented in the past now presenting with chest pain for several weeks. Pharmacy consulted to dose heparin.   Patient is not on any anticoagulation PTA. Of note received 1 dose of SQ heparin this morning. CBC is trending down slightly but no signs of bleeding.  Goal of Therapy:  Heparin level 0.3-0.7 units/ml Monitor platelets by anticoagulation protocol: Yes   Plan:  Give 4000 units bolus x 1 Start heparin infusion at 1300 units/hr Check anti-Xa level in 8 hours and daily while on heparin Continue to monitor H&H and platelets  F/u cardiology plans   Charlene Brooke, PharmD PGY1 Pharmacy Resident [place] [phone] 06/18/2017,11:18 AM

## 2017-06-18 NOTE — Interval H&P Note (Signed)
Cath Lab Visit (complete for each Cath Lab visit)  Clinical Evaluation Leading to the Procedure:   ACS: Yes.    Non-ACS:    Anginal Classification: CCS IV  Anti-ischemic medical therapy: Minimal Therapy (1 class of medications)  Non-Invasive Test Results: No non-invasive testing performed  Prior CABG: No previous CABG      History and Physical Interval Note:  06/18/2017 5:17 PM  Ryan Garcia  has presented today for surgery, with the diagnosis of cp  The various methods of treatment have been discussed with the patient and family. After consideration of risks, benefits and other options for treatment, the patient has consented to  Procedure(s): LEFT HEART CATH AND CORONARY ANGIOGRAPHY (N/A) as a surgical intervention .  The patient's history has been reviewed, patient examined, no change in status, stable for surgery.  I have reviewed the patient's chart and labs.  Questions were answered to the patient's satisfaction.     Larae Grooms

## 2017-06-18 NOTE — ED Notes (Signed)
Called respiratory to put pt on cpap machine while sleeping

## 2017-06-18 NOTE — ED Notes (Signed)
Heparin verified with Maggie RN 

## 2017-06-18 NOTE — H&P (View-Only) (Signed)
Progress Note  Patient Name: Ryan Garcia Date of Encounter: 06/18/2017  Primary Cardiologist: Dr Stanford Breed  Subjective   No CP or dyspnea  Inpatient Medications    Scheduled Meds: . aspirin EC  81 mg Oral Daily  . heparin  5,000 Units Subcutaneous Q8H  . hydrochlorothiazide  12.5 mg Oral Daily  . insulin aspart  0-5 Units Subcutaneous QHS  . insulin aspart  0-9 Units Subcutaneous TID WC  . pravastatin  80 mg Oral QPM   Continuous Infusions:  PRN Meds: acetaminophen, acetaminophen, nitroGLYCERIN, ondansetron (ZOFRAN) IV, ondansetron (ZOFRAN) IV   Vital Signs    Vitals:   06/18/17 0815 06/18/17 0900 06/18/17 1000 06/18/17 1030  BP:  135/64 129/71 135/75  Pulse:  61 60 60  Resp: 15 16 16 16   Temp:      TempSrc:      SpO2:  96% 92% 96%  Weight:      Height:       No intake or output data in the 24 hours ending 06/18/17 1100 Filed Weights   06/17/17 1212  Weight: 245 lb (111.1 kg)    Physical Exam   GEN: WD, obese No acute distress.   Neck: No JVD Cardiac: RRR, no murmurs, rubs, or gallops.  Respiratory: Clear to auscultation bilaterally. GI: Soft, nontender, non-distended  MS: No edema Neuro:  Nonfocal  Psych: Normal affect   Labs    Chemistry Recent Labs  Lab 06/17/17 1215 06/18/17 0208  NA 141  --   K 3.9  --   CL 105  --   CO2 27  --   GLUCOSE 269*  --   BUN 18  --   CREATININE 1.19 1.15  CALCIUM 9.1  --   GFRNONAA 55* 58*  GFRAA >60 >60  ANIONGAP 9  --      Hematology Recent Labs  Lab 06/17/17 1215 06/18/17 0208  WBC 7.0 7.6  RBC 4.42 4.11*  HGB 13.6 12.8*  HCT 42.3 38.7*  MCV 95.7 94.2  MCH 30.8 31.1  MCHC 32.2 33.1  RDW 14.8 14.6  PLT 149* 133*    Cardiac Enzymes Recent Labs  Lab 06/17/17 1843 06/18/17 0208  TROPONINI 0.64* 0.48*    Recent Labs  Lab 06/17/17 1224  TROPIPOC 0.06     Radiology    Dg Chest 2 View  Result Date: 06/17/2017 CLINICAL DATA:  Chest pain. EXAM: CHEST  2 VIEW COMPARISON:   05/23/2011 FINDINGS: Coarse lung markings appear chronic. No evidence for airspace disease or pulmonary edema. Heart and mediastinum are within normal limits. There may be small nodular density in the right lower chest but minimal change since 2012. Bridging osteophytes in the thoracic spine. No large pleural effusions. Degenerative changes at the [facility] joints bilaterally. IMPRESSION: No active cardiopulmonary disease. Electronically Signed   By: Markus Daft M.D.   On: 06/17/2017 12:57     Patient Profile     83 y.o. male with past medical history of severe coronary disease admitted with non-ST elevation myocardial infarction.  Assessment & Plan    1 non-ST elevation myocardial infarction-patient is pain-free this morning.  His presenting symptoms are classic for unstable angina/non-ST elevation myocardial infarction.  The symptoms are new over the past 1 month and it is likely that his coronary artery disease has progressed.  He is now willing to proceed with coronary artery bypass and graft if needed.  We will proceed with cardiac catheterization.  The risks and benefits including myocardial  infarction, CVA and death discussed and he agrees to proceed.  Continue aspirin and statin.  Add IV heparin and low-dose metoprolol. Resume imdur.  2 hypertension-I will hold HCTZ prior to catheterization but resume after.  Follow blood pressure and adjust regimen as needed.  3 hyperlipidemia-continue pravastatin.  He did not tolerate Lipitor in the past.  For questions or updates, please contact [place] Please consult [email or web address] for contact info under Cardiology/STEMI.      Signed, Kirk Ruths, MD  06/18/2017, 11:00 AM

## 2017-06-19 ENCOUNTER — Encounter (HOSPITAL_COMMUNITY): Payer: Self-pay | Admitting: Interventional Cardiology

## 2017-06-19 ENCOUNTER — Ambulatory Visit (HOSPITAL_COMMUNITY): Payer: Medicare HMO

## 2017-06-19 ENCOUNTER — Telehealth: Payer: Self-pay | Admitting: Cardiology

## 2017-06-19 DIAGNOSIS — Z006 Encounter for examination for normal comparison and control in clinical research program: Secondary | ICD-10-CM

## 2017-06-19 DIAGNOSIS — I361 Nonrheumatic tricuspid (valve) insufficiency: Secondary | ICD-10-CM

## 2017-06-19 LAB — BASIC METABOLIC PANEL
Anion gap: 11 (ref 5–15)
BUN: 19 mg/dL (ref 6–20)
CALCIUM: 8.3 mg/dL — AB (ref 8.9–10.3)
CHLORIDE: 107 mmol/L (ref 101–111)
CO2: 20 mmol/L — AB (ref 22–32)
CREATININE: 1.22 mg/dL (ref 0.61–1.24)
GFR calc Af Amer: >60 mL/min (ref >60)
GFR calc non Af Amer: 54 mL/min — ABNORMAL LOW (ref >60)
Glucose, Bld: 145 mg/dL — ABNORMAL HIGH (ref 65–99)
Potassium: 3.9 mmol/L (ref 3.5–5.1)
SODIUM: 138 mmol/L (ref 135–145)

## 2017-06-19 LAB — GLUCOSE, CAPILLARY
GLUCOSE-CAPILLARY: 170 mg/dL — AB (ref 65–99)
Glucose-Capillary: 102 mg/dL — ABNORMAL HIGH (ref 65–99)
Glucose-Capillary: 150 mg/dL — ABNORMAL HIGH (ref 65–99)
Glucose-Capillary: 192 mg/dL — ABNORMAL HIGH (ref 65–99)

## 2017-06-19 LAB — CBC
HCT: 37.4 % — ABNORMAL LOW (ref 39.0–52.0)
HEMOGLOBIN: 11.9 g/dL — AB (ref 13.0–17.0)
MCH: 30.1 pg (ref 26.0–34.0)
MCHC: 31.8 g/dL (ref 30.0–36.0)
MCV: 94.4 fL (ref 78.0–100.0)
Platelets: 137 10*3/uL — ABNORMAL LOW (ref 150–400)
RBC: 3.96 MIL/uL — ABNORMAL LOW (ref 4.22–5.81)
RDW: 14.7 % (ref 11.5–15.5)
WBC: 7.1 10*3/uL (ref 4.0–10.5)

## 2017-06-19 LAB — COMPREHENSIVE METABOLIC PANEL
ALBUMIN: 3.7 g/dL (ref 3.5–5.0)
ALK PHOS: 79 U/L (ref 38–126)
ALT: 27 U/L (ref 17–63)
ANION GAP: 11 (ref 5–15)
AST: 28 U/L (ref 15–41)
BUN: 20 mg/dL (ref 6–20)
CALCIUM: 8.5 mg/dL — AB (ref 8.9–10.3)
CHLORIDE: 100 mmol/L — AB (ref 101–111)
CO2: 20 mmol/L — AB (ref 22–32)
Creatinine, Ser: 1.44 mg/dL — ABNORMAL HIGH (ref 0.61–1.24)
GFR calc non Af Amer: 44 mL/min — ABNORMAL LOW (ref >60)
GFR, EST AFRICAN AMERICAN: 51 mL/min — AB (ref >60)
GLUCOSE: 195 mg/dL — AB (ref 65–99)
Potassium: 3.8 mmol/L (ref 3.5–5.1)
SODIUM: 131 mmol/L — AB (ref 135–145)
Total Bilirubin: 1.3 mg/dL — ABNORMAL HIGH (ref 0.3–1.2)
Total Protein: 6.7 g/dL (ref 6.5–8.1)

## 2017-06-19 LAB — ECHOCARDIOGRAM COMPLETE
HEIGHTINCHES: 72 in
Weight: 4021.19 oz

## 2017-06-19 LAB — HEPATIC FUNCTION PANEL
ALBUMIN: 3.1 g/dL — AB (ref 3.5–5.0)
ALT: 20 U/L (ref 17–63)
AST: 18 U/L (ref 15–41)
Alkaline Phosphatase: 63 U/L (ref 38–126)
BILIRUBIN DIRECT: 0.1 mg/dL (ref 0.1–0.5)
BILIRUBIN TOTAL: 1.1 mg/dL (ref 0.3–1.2)
Indirect Bilirubin: 1 mg/dL — ABNORMAL HIGH (ref 0.3–0.9)
Total Protein: 5.7 g/dL — ABNORMAL LOW (ref 6.5–8.1)

## 2017-06-19 LAB — MAGNESIUM: MAGNESIUM: 2 mg/dL (ref 1.7–2.4)

## 2017-06-19 MED ORDER — ROSUVASTATIN CALCIUM 20 MG PO TABS
40.0000 mg | ORAL_TABLET | Freq: Every day | ORAL | Status: DC
  Administered 2017-06-19: 40 mg via ORAL
  Filled 2017-06-19: qty 2

## 2017-06-19 MED ORDER — METFORMIN HCL 1000 MG PO TABS: 1000.0000 mg | ORAL_TABLET | Freq: Two times a day (BID) | ORAL | 3 refills | Status: DC

## 2017-06-19 MED ORDER — TICAGRELOR 90 MG PO TABS: 90.0000 mg | ORAL_TABLET | Freq: Two times a day (BID) | ORAL | 3 refills | Status: DC

## 2017-06-19 MED ORDER — METOPROLOL TARTRATE 25 MG PO TABS: 12.5000 mg | ORAL_TABLET | Freq: Two times a day (BID) | ORAL | 3 refills | Status: DC

## 2017-06-19 MED ORDER — ROSUVASTATIN CALCIUM 40 MG PO TABS: 40.0000 mg | ORAL_TABLET | Freq: Every day | ORAL | 3 refills | Status: DC

## 2017-06-19 MED ORDER — STUDY - AEGIS II STUDY - PLACEBO OR CSL112 (PI-HILTY)
170.0000 mL | INTRAVENOUS | Status: DC
  Administered 2017-06-19: 170 mL via INTRAVENOUS
  Filled 2017-06-19: qty 170

## 2017-06-19 MED ORDER — TICAGRELOR 90 MG PO TABS: 90.0000 mg | ORAL_TABLET | Freq: Two times a day (BID) | ORAL | 0 refills | Status: DC

## 2017-06-19 NOTE — Telephone Encounter (Signed)
New Message    TCM - [place] 12/27 830a  Pt [place]

## 2017-06-19 NOTE — Research (Addendum)
AEGIS-II research study:  Patient seen an examined prior to randomization and infusion. No signs of symptoms of heart failure - Killip Class I.  General appearance: alert and no distress Neck: no carotid bruit, no JVD and thyroid not enlarged, symmetric, no tenderness/mass/nodules Lungs: clear to auscultation bilaterally Heart: irregularly irregular rhythm and no S3 or S4 Abdomen: soft, non-tender; bowel sounds normal; no masses,  no organomegaly Extremities: extremities normal, atraumatic, no cyanosis or edema Pulses: 2+ and symmetric Skin: Skin color, texture, turgor normal. No rashes or lesions Neurologic: Grossly normal Psych: Pleasant  The patient was given the opportunity to ask any further questions about the study that were not addressed by the research nurse coordinators - he has no further questions and wants to proceed at this time.  Pixie Casino, MD, [facility] [place] Director of the [facility]  Card[facility] Attending Cardiologist  Direct Dial: [phone]  Fax: [phone]  Website:  [email or web address]

## 2017-06-19 NOTE — Care Management Note (Addendum)
Case Management Note  Patient Details  Name: Ryan Garcia MRN: [id number] Date of Birth: [birth year]  Subjective/Objective:  From home with wife , pta indep, s/p coronary stent, will be on brilinta, NCM gave patient a 30 day fre coupon, NCM awaiting benefit check. Benefit check came back as estimate of 98.00, N[facility] but did not fill it ,she states it was showing 103.00,  NCM gave this information to wife and to [employer].  MD may want to switch patient to plavix after the 30 day free of brilinta.                  Action/Plan: For echo today then dc home.    Expected Discharge Date:                  Expected Discharge Plan:  Home/Self Care  In-House Referral:     Discharge planning Services  CM Consult  Post Acute Care Choice:    Choice offered to:     DME Arranged:    DME Agency:     HH Arranged:    [place] Agency:     Status of Service:  Completed, signed off  If discussed at [employer] of Stay Meetings, dates discussed:    Additional Comments:  Zenon Mayo, RN 06/19/2017, 10:23 AM

## 2017-06-19 NOTE — Discharge Summary (Signed)
Discharge Summary    Patient ID: Ryan Garcia,  MRN: [id number], DOB/AGE: 1933/09/29 83 y.o.  Admit date: 06/17/2017 Discharge date: 06/19/2017   Primary Care Provider: Loura Pardon A Primary Cardiologist: Dr. Stanford Breed  Discharge Diagnoses    Principal Problem:   NSTEMI (non-ST elevated myocardial infarction) [facility]) Active Problems:   Diabetes type 2, uncontrolled ([place])   HYPERCHOLESTEROLEMIA   Obesity   Essential hypertension   GERD   CAD (coronary artery disease)   Encounter for examination of normal volunteer in research study   Chest pain   Allergies Allergies  Allergen Reactions  . Loratadine     REACTION: not effective  . Simvastatin     REACTION: intolerant     History of Present Illness     Ryan Garcia is a pleasant 83 y.o. male with past medical history of CAD (s/p cath in 2012 showing 70-75% LAD stenosis, 75% D1 stenosis, 75% OM1, 60-70% RCA stenosis, and 80% PDA --> medical therapy pursued, similar findings by cath in 2016), HTN, HLD, OSA (on CPAP), and Type 2 DM who is seen in the ED 06/17/17 with complaints of chest pain.   The pt describes SSCP "tightness" with activity for the past 3 weeks or so. His symptoms have been relieved with rest but the past week he has noted onset of symptoms with less exercise and occasionally at rest.   He tried NTG the last two days which helped. This am he had chest pain after breakfast and called the office. He was instructed to come to the ED for further evaluation. He is pain free here. Initial POC troponin was negative x 1, no acute EKG changes.    Hospital Course     Consultants: none  NSTEMI, unstable angina Pt was admitted to cardiology and troponins trended: 0.06 --> 0.64 --> 0.48. Decision was made to proceed with heart catheterization. Pt expressed preference to avoid CABG. Left heart cors were essentially unchanged from prior cath. Distal RCA with 99% stenosis, deployed synergy DES with post intervention  0% stenosis. Recommend DAPT for 1 year given NSTEMI. However, DAPT could be stopped in 3 months if needed.    Echo with normal EF and no valvular disease.  HTN HCTZ initially held following cath. Resume all home meds prior to discharge.  HLD Pt was on pravastatin at home. Has been intolerant to lipitor in the past. Will try crestor 40 mg daily. Check lipids and LFTs in 4 weeks.  Patient seen and examined by Dr. Stanford Breed today and was stable for discharge. All follow up has been arranged.  _____________  Discharge Vitals Blood pressure 130/66, pulse 67, temperature 98.8 F (37.1 C), temperature source Oral, resp. rate 16, height 6' (1.829 m), weight 251 lb 5.2 oz (114 kg), SpO2 96 %.  Filed Weights   06/17/17 1212 06/18/17 1518 06/19/17 0418  Weight: 245 lb (111.1 kg) 251 lb 11.2 oz (114.2 kg) 251 lb 5.2 oz (114 kg)    Labs & Radiologic Studies    CBC Recent Labs    06/18/17 0208 06/19/17 0439  WBC 7.6 7.1  HGB 12.8* 11.9*  HCT 38.7* 37.4*  MCV 94.2 94.4  PLT 133* 332*   Basic Metabolic Panel Recent Labs    06/19/17 0439 06/19/17 1302  NA 138 131*  K 3.9 3.8  CL 107 100*  CO2 20* 20*  GLUCOSE 145* 195*  BUN 19 20  CREATININE 1.22 1.44*  CALCIUM 8.3* 8.5*   Liver Function  Tests Recent Labs    06/19/17 0439 06/19/17 1302  AST 18 28  ALT 20 27  ALKPHOS 63 79  BILITOT 1.1 1.3*  PROT 5.7* 6.7  ALBUMIN 3.1* 3.7   No results for input(s): LIPASE, AMYLASE in the last 72 hours. Cardiac Enzymes Recent Labs    06/17/17 1843 06/18/17 0208 06/18/17 2110  CKTOTAL  --   --  58  CKMB  --   --  2.2  TROPONINI 0.64* 0.48*  --    BNP Invalid input(s): POCBNP D-Dimer No results for input(s): DDIMER in the last 72 hours. Hemoglobin A1C No results for input(s): HGBA1C in the last 72 hours. Fasting Lipid Panel No results for input(s): CHOL, HDL, LDLCALC, TRIG, CHOLHDL, LDLDIRECT in the last 72 hours. Thyroid Function Tests No results for input(s): TSH,  T4TOTAL, T3FREE, THYROIDAB in the last 72 hours.  Invalid input(s): FREET3 _____________  Dg Chest 2 View  Result Date: 06/17/2017 CLINICAL DATA:  Chest pain. EXAM: CHEST  2 VIEW COMPARISON:  05/23/2011 FINDINGS: Coarse lung markings appear chronic. No evidence for airspace disease or pulmonary edema. Heart and mediastinum are within normal limits. There may be small nodular density in the right lower chest but minimal change since 2012. Bridging osteophytes in the thoracic spine. No large pleural effusions. Degenerative changes at the [facility] joints bilaterally. IMPRESSION: No active cardiopulmonary disease. Electronically Signed   By: Markus Daft M.D.   On: 06/17/2017 12:57     Diagnostic Studies/Procedures    Echo 06/19/17: Study Conclusions - Left ventricle: The cavity size was normal. Wall thickness was   increased in a pattern of mild LVH. Systolic function was normal.   The estimated ejection fraction was in the range of 60% to 65%.   Wall motion was normal; there were no regional wall motion   abnormalities. Left ventricular diastolic function parameters   were normal. - Left atrium: The atrium was mildly dilated. - Atrial septum: No defect or patent foramen ovale was identified.   Left heart cath 06/18/17:  Prox LAD lesion is 85% stenosed.  Prox LAD to Mid LAD lesion is 75% stenosed.  Ost 1st Diag to 1st Diag lesion is 85% stenosed.  Dist LAD lesion is 65% stenosed.  Ost Cx to Mid Cx lesion is 80% stenosed.  Ost 1st Mrg to 1st Mrg lesion is 95% stenosed.  Mid Cx to Dist Cx lesion is 65% stenosed.  2nd Mrg lesion is 75% stenosed.  Prox RCA to Mid RCA lesion is 30% stenosed.  RPDA lesion is 60% stenosed.  1st RPLB lesion is 70% stenosed.  Dist RCA lesion is 99% stenosed.  A drug-eluting stent was successfully placed using a STENT SYNERGY DES 3X12, postdilated to 3.3 mm.  Post intervention, there is a 0% residual stenosis.  LV end diastolic pressure is  normal.  There is no aortic valve stenosis.   Left sided disease unchanged from prior.  Culprit was RCA lesion.  D/w Dr. Stanford Breed, and decided to proceed with fixing the culprit lesion given the patient's preference of avoiding CABG.    SYNERGY stent placed so DAPT could be stopped at 3 months if needed.  Otherwise, would recommend DAPT for 1 year given NSTEMI.       Disposition   Pt is being discharged home today in good condition.  Follow-up Plans & Appointments    Follow-up Information    Erlene Quan, PA-C Follow up on 06/27/2017.   Specialties:  Cardiology, Radiology Why:  8:30  AM TCM hospital follow up Contact information: [address] [place] [address] [phone]          Discharge Instructions    Amb Referral to Cardiac Rehabilitation   Complete by:  As directed    Diagnosis:   PTCA Coronary Stents NSTEMI     Diet - low sodium heart healthy   Complete by:  As directed    Diet - low sodium heart healthy   Complete by:  As directed    Discharge instructions   Complete by:  As directed    No driving for 2 days. No lifting over 5 lbs for 1 week. No sexual activity for 1 week. Keep procedure site clean & dry. If you notice increased pain, swelling, bleeding or pus, call/return!  You may shower, but no soaking baths/hot tubs/pools for 1 week.   Discharge instructions   Complete by:  As directed    No driving for 2 days. No lifting over 5 lbs for 1 week. No sexual activity for 1 week. Keep procedure site clean & dry. If you notice increased pain, swelling, bleeding or pus, call/return!  You may shower, but no soaking baths/hot tubs/pools for 1 week.   Increase activity slowly   Complete by:  As directed    Increase activity slowly   Complete by:  As directed       Discharge Medications   Allergies as of 06/19/2017      Reactions   Loratadine    REACTION: not effective   Simvastatin    REACTION: intolerant      Medication List     STOP taking these medications   aspirin-sod bicarb-citric acid 325 MG Tbef tablet Commonly known as:  ALKA-SELTZER   ibuprofen 200 MG tablet Commonly known as:  ADVIL,MOTRIN   pravastatin 80 MG tablet Commonly known as:  PRAVACHOL     TAKE these medications   aspirin 81 MG tablet Take 81 mg by mouth daily.   cetirizine 10 MG tablet Commonly known as:  ZYRTEC TAKE 1 TABLET EVERY DAY   CO Q 10 PO Take 1 tablet by mouth daily.   fluorouracil 5 % cream Commonly known as:  EFUDEX Apply 2 application topically daily as needed (skin lesions).   glipiZIDE 10 MG tablet Commonly known as:  GLUCOTROL TAKE 1 TABLET TWICE DAILY WITH MEALS   glucose blood test strip Commonly known as:  TRUE METRIX BLOOD GLUCOSE TEST CHECK BLOOD SUGAR TWICE DAILY AND AS NEEDED FOR DM (dx. E11.65)   hydrochlorothiazide 12.5 MG tablet Commonly known as:  HYDRODIURIL Take 1 tablet (12.5 mg total) daily by mouth.   Insulin Glargine 100 UNIT/ML Solostar Pen Commonly known as:  LANTUS SOLOSTAR INJECT  45 UNITS SUBCUTANEOUSLY AT BEDTIME   Insulin Pen Needle 31G X 6 MM Misc Commonly known as:  UNIFINE PENTIPS USE PEN NEEDLES WHEN TAKING LANTUS AT BEDTIME   isosorbide mononitrate 30 MG 24 hr tablet Commonly known as:  IMDUR Take 1 tablet (30 mg total) daily by mouth.   lisinopril 10 MG tablet Commonly known as:  PRINIVIL,ZESTRIL Take 1 tablet (10 mg total) daily by mouth.   metFORMIN 1000 MG tablet Commonly known as:  GLUCOPHAGE Take 1 tablet (1,000 mg total) by mouth 2 (two) times daily. Resume on 06/22/17. What changed:  additional instructions   metoprolol tartrate 25 MG tablet Commonly known as:  LOPRESSOR Take 0.5 tablets (12.5 mg total) by mouth 2 (two) times daily.   nitroGLYCERIN 0.4  MG SL tablet Commonly known as:  NITROSTAT Place 1 tablet (0.4 mg total) every 5 (five) minutes as needed under the tongue for chest pain.   rosuvastatin 40 MG tablet Commonly known as:   CRESTOR Take 1 tablet (40 mg total) by mouth daily at 6 PM.   ticagrelor 90 MG Tabs tablet Commonly known as:  BRILINTA Take 1 tablet (90 mg total) by mouth 2 (two) times daily.   triamcinolone 55 MCG/ACT Aero nasal inhaler Commonly known as:  NASACORT USE ONE SPRAYS EACH NOSTRIL EVERY DAY as needed   TRUE METRIX AIR GLUCOSE METER w/Device Kit 1 kit by Other route 2 (two) times daily. Check blood sugar twice daily and as directed. Dx E11.65   TRUEPLUS LANCETS 30G Misc Check blood sugar twice daily and as directed. Dx E11.65   VITAMIN B12 PO Take 1 tablet by mouth 2 (two) times a week.        Aspirin prescribed at discharge?  Yes High Intensity Statin Prescribed? (Lipitor 40-93m or Crestor 20-456m: yes Beta Blocker Prescribed? Yes For EF <40%, was ACEI/ARB Prescribed? No: AKI ADP Receptor Inhibitor Prescribed? (i.e. Plavix etc.-Includes Medically Managed Patients): Yes For EF <40%, Aldosterone Inhibitor Prescribed? No: AKI Was EF assessed during THIS hospitalization? Yes Was Cardiac Rehab II ordered? (Included Medically managed Patients): Yes   Outstanding Labs/Studies   If tolerates crestor 40 mg, will need lipids and LFTs in 4 weeks  Duration of Discharge Encounter   Greater than 30 minutes including physician time.  Signed, AnTami Linuke PA-C 06/19/2017, 4:06 PM

## 2017-06-19 NOTE — Research (Signed)
AEGIS - II Informed Consent   Subject Name: Ryan Garcia  Subject met inclusion and exclusion criteria.  The informed consent form, study requirements and expectations were reviewed with the subject and questions and concerns were addressed prior to the signing of the consent form.  The subject verbalized understanding of the trial requirements.  The subject agreed to participate in the AEGIS-II trial and signed the informed consent.  The informed consent was obtained prior to performance of any protocol-specific procedures for the subject.  A copy of the signed informed consent was given to the subject and a copy was placed in the subject's medical record.    Blossom Hoops 06/19/2017, [phone]

## 2017-06-19 NOTE — Progress Notes (Addendum)
Progress Note  Patient Name: Ryan Garcia Date of Encounter: 06/19/2017  Primary Cardiologist: Dr Stanford Breed  Subjective   No chest pain or dyspnea  Inpatient Medications    Scheduled Meds: . aspirin EC  81 mg Oral Daily  . insulin aspart  0-5 Units Subcutaneous QHS  . insulin aspart  0-9 Units Subcutaneous TID WC  . isosorbide mononitrate  60 mg Oral Daily  . metoprolol tartrate  12.5 mg Oral BID  . pravastatin  80 mg Oral QPM  . sodium chloride flush  3 mL Intravenous Q12H  . ticagrelor  90 mg Oral BID   Continuous Infusions: . sodium chloride     PRN Meds: sodium chloride, acetaminophen, nitroGLYCERIN, ondansetron (ZOFRAN) IV, sodium chloride flush   Vital Signs    Vitals:   06/18/17 2300 06/18/17 2305 06/19/17 0418 06/19/17 0804  BP: (!) 117/54 (!) 129/39 (!) 140/45 (!) 132/53  Pulse: (!) 57 (!) 58 68 68  Resp: 11 19 18 15   Temp:   98.8 F (37.1 C) 98.7 F (37.1 C)  TempSrc:   Oral Oral  SpO2: 95% 92% 96% 94%  Weight:   251 lb 5.2 oz (114 kg)   Height:        Intake/Output Summary (Last 24 hours) at 06/19/2017 0825 Last data filed at 06/19/2017 7253 Gross per 24 hour  Intake 1056 ml  Output 300 ml  Net 756 ml   Filed Weights   06/17/17 1212 06/18/17 1518 06/19/17 0418  Weight: 245 lb (111.1 kg) 251 lb 11.2 oz (114.2 kg) 251 lb 5.2 oz (114 kg)    Telemetry    Sinus with pacs and couplet- Personally Reviewed   Physical Exam   GEN: Obese No acute distress.   Neck: No JVD Cardiac: RRR, no murmurs, rubs, or gallops.  Respiratory: Clear to auscultation bilaterally. GI: Soft, nontender, non-distended  MS: No edema; Radial cath site with no hematoma Neuro:  Nonfocal  Psych: Normal affect   Labs    Chemistry Recent Labs  Lab 06/17/17 1215 06/18/17 0208 06/18/17 1529 06/19/17 0439  NA 141  --  138 138  K 3.9  --  4.0 3.9  CL 105  --  105 107  CO2 27  --  25 20*  GLUCOSE 269*  --  139* 145*  BUN 18  --  17 19  CREATININE 1.19 1.15  1.15 1.22  CALCIUM 9.1  --  9.0 8.3*  GFRNONAA 55* 58* 58* 54*  GFRAA >60 >60 >60 >60  ANIONGAP 9  --  8 11     Hematology Recent Labs  Lab 06/17/17 1215 06/18/17 0208 06/19/17 0439  WBC 7.0 7.6 7.1  RBC 4.42 4.11* 3.96*  HGB 13.6 12.8* 11.9*  HCT 42.3 38.7* 37.4*  MCV 95.7 94.2 94.4  MCH 30.8 31.1 30.1  MCHC 32.2 33.1 31.8  RDW 14.8 14.6 14.7  PLT 149* 133* 137*    Cardiac Enzymes Recent Labs  Lab 06/17/17 1843 06/18/17 0208  TROPONINI 0.64* 0.48*    Recent Labs  Lab 06/17/17 1224  TROPIPOC 0.06      Radiology    Dg Chest 2 View  Result Date: 06/17/2017 CLINICAL DATA:  Chest pain. EXAM: CHEST  2 VIEW COMPARISON:  05/23/2011 FINDINGS: Coarse lung markings appear chronic. No evidence for airspace disease or pulmonary edema. Heart and mediastinum are within normal limits. There may be small nodular density in the right lower chest but minimal change since 2012. Bridging osteophytes in  the thoracic spine. No large pleural effusions. Degenerative changes at the [facility] joints bilaterally. IMPRESSION: No active cardiopulmonary disease. Electronically Signed   By: Markus Daft M.D.   On: 06/17/2017 12:57    Patient Profile     83 y.o. male with past medical history of severe coronary disease admitted with non-ST elevation myocardial infarction; now s/p PCI of RCA.    Assessment & Plan    1 non-ST elevation myocardial infarction-Pt doing well this AM following PCI of RCA; continue ASA, brilinta and statin. Continue metoprolol. Plan is for medical therapy for residual CAD as long as pt pain free. Pt would like to avoid CABG if at all possible. Check echo for LV function.   2 hypertension-Resume all preadmission BP meds at DC.   3 hyperlipidemia-pt did not tolerate lipitor in past; DC pravastatin; begin crestor 40 mg daily to see if he tolerates; if so check lipids and liver 4 weeks  Ambulate this AM; if no CP and LV function unchanged on echo, DC home and fu with me in  Jan as scheduled  > 30 min PA and physician time D2    For questions or updates, please contact [place] Please consult [email or web address] for contact info under Cardiology/STEMI.      Signed, Kirk Ruths, MD  06/19/2017, 8:25 AM

## 2017-06-19 NOTE — Progress Notes (Signed)
Echocardiogram Echocardiogram Transesophageal has been performed.  Ryan Garcia 06/19/2017, 3:18 PM

## 2017-06-19 NOTE — Progress Notes (Signed)
#  3. S/W JASMINE  @ [employer] # [phone]    BRILINTA  90  MG BID   COVER- YES  CO-PAY- $ 98.35  TIER- 3 DRUG  PRIOR APPROVAL- NO  DEDUCTIBLE: NOT MET AND INITIAL COVERAGE STAGE    PREFERRED PHARMACY: [facility]

## 2017-06-19 NOTE — Progress Notes (Signed)
CARDIAC REHAB PHASE I   PRE:  Rate/Rhythm: 66 SR    BP: sitting 148/61    SaO2:   MODE:  Ambulation: 500 ft   POST:  Rate/Rhythm: 83 SR    BP: sitting 191/66     SaO2:   Tolerated well, no c/o. BP elevated. Pt had short burst of ? afib after sitting down. Several beats, then SR with PACs. Pt asx. Ed completed with pt and wife. Voiced understanding. Needs to see CM re Brilinta. Will refer to [place], ACSM 06/19/2017 9:31 AM

## 2017-06-19 NOTE — Research (Signed)
Inclusions:   Y N   yes/no: Yes   Male or male at least 81 years of age  yes/no: Yes  Evidence of type I (spontaneous) MI as defined by the following:  yes/no: Yes  a. Detection of a rise and/or fall in Troponin I or T with at least 1 value about the 99% upper reference limit.  yes/no: Yes   (AND)---  Any 1 or more of the following:   yes/no: Yes  - symptoms of ischemia (ie, resulting from a primary coronary    artery event)    - New or presumably new significant ST/T wave changes or left bundle branch block.         - Development of pathological Q waves on EKG    - Imaging evidence of new loss or viable myocardium or regional wall motion abnormality.  yes/no: Yes  - ID of intracoronary thrombus by angiography.  yes/no: Yes  No suspicion of acute kidney injury at least 12 hours after angiography OR after first medical contract for subject's not undergoing angiography There must be documented evidence of stable renal function defined as no more than an increase in Serum Creatinine < 0.8m/dl from pre-contrast serum creatinine value.  (Before _81____   12 hrs after ___1.44_____)  yes/no: Yes  Evidence of multi-vessel coronary artery disease defined as:  yes/no: Yes  A. At least 50% stenosis on >1 epicardial artery or left main artery on catherization performed during the index hospitalization.  yes/no: Yes  B. Prior cardiac catherization with at least 50% stenosis on >1 epicardial artery or left main artery.  yes/no: Yes  C. Prior PCI and evidence of at least 50% stenosis of at least 1 epicardial artery different from prior revascularized artery.    D. Prior multivessel coronary artery bypass grafting.  yes/no: Yes  At least 1 of the following established risk factors:  yes/no: Yes  o Age ? 83years    o Prior history of MI  yes/no: Yes  o On pharmacological treatment for diabetes mellitus    o Peripheral arterial disease defined as meeting at least 1 of the following criteria:            +    Current intermittent claudication or resting limb ischemia and ABI    0.90            +   History of peripheral revascularization (surgical or percutaneous)            +  History of limb amputation due to PAD            +  Angiographic evidence (using computed tomographic angiography, MRA, or invasive angiography or a peripheral artery stenosis ?50%.  yes/no: Yes  Male subjects must be post-meopausal or with a negative urine                                      pregnancy test prior to randomization. If the urine test cannot be confirmed as negative, a serum pregnancy test will be required. Pregnancy test must be negative.   yes/no: Yes  Investigator believes that the subject is willing and able to adhere to all protocol requirements.   yes/no: Yes  Willing to not participate in another investigational study until completion of their final study visit.     Exclusions:  Y N    yes/no: No If these are the  reason for MI (pt is excluded)   yes/no: No 1. Myocardial necrosis due mismatch between myocardial oxygen demand and supply, usually due to fixed coronary disease with increased demand leading to MI   yes/no: No 2. Cardiac death due to MI   yes/no: No 3. Myocardial necrosis due to complications from a PCI   yes/no: No 4. Myocardial necrosis due to stent thrombosis   yes/no: No 5. Myocardial necrosis due to in stent restenosis as the only etiology   yes/no: No 6. Myocardial necrosis in the stenting of coronary artery bypass grafting    Ongoing hemodynamic instability   yes/no: No      +  History of NYHA Class III or IV heart failure within the last year   yes/no: No      +  Killip Class III or IV heart failure   yes/no: No      +  Sustained and/or symptomatic hypotension (SBP <90 mm HG)   yes/no: No      +  Known left ventricular ejection fraction of <30%    Evidence of hepatobiliary disease as indicated by any 1 or more of the   following at screening:   yes/no: No      +  Current active  hepatic dysfunction or active biliary obstruction   yes/no: No      +  Chronic or prior history of cirrhosis or of infectious / inflammatory hepatitis   yes/no: No      + Hepatic lab abnormalities: ALT > 3 x ULN or Total bilirubin > 2x ULN at randomization.   yes/no: No Severe chronic kidney disease (eGFR of <3m) or on dialysis   yes/no: No Plan to undergo scheduled coronary artery bypass graft surgery after randomization, as determined at the time of screening   yes/no: No Known history of allergies to soybeans, peanuts, albumin   yes/no: No A known history of IgA deficiency or antibodies to IgA   yes/no: No A comorbid condition with an estimated life expectancy of ? 6 months at time of consent   yes/no: No Women who are pregnant or breastfeeding at time of randomization   yes/no: No Participated in another interventional clinical study at the time of consent   yes/no: No Treatment with anticancer therapy   yes/no: No Previously randomized or participated in this study or [facility]

## 2017-06-20 NOTE — Research (Signed)
"CONSENT"   YES     NO   Continuing further Investigational Product and study visits for follow-up?        [x]           []     Continuing consent from future biomedical research        [x]        []                                        "EVENTS"    YES     NO  AE   (IF YES SEE SOURCE)     []        [x]     SAE  (IF YES SEE SOURCE)     []        [x]     ENDPOINT   (IF YES SEE SOURCE)     []        [x]     REVASCULARIZATION  (IF YES SEE SOURCE)     []        [x]     AMPUTATION   (IF YES SEE SOURCE)     []        [x]     TROPONIN'S  (IF YES SEE SOURCE)     []        [x]       DEMOGRAPHICS:  Patient Name: Ryan Garcia Birth Date: 1933/10/19  Sex: Male  Race: white  Child Bearing: ? Yes    ? No ? Tubial ligation ? Hysterectomy  ? postmenopausal   Height: 182.9 cm Weight: 111.1 kg   Index Procedure:  Onset date of symptoms: 17-Dec-18 Onset of symptoms: 1058  Date of First contact at hospital: 17-Dec-18 Time of first contact at hospital: 1655  Admission Date: 17-Dec-18   Discharge Date: 19-Dec-18 Discharge Time: 1940   Prior to infusion Vital Signs:  Date 06/19/2017    Time: 1556 Temp: 98.3 F  BP: 144/49  Pulse: 62   O2 saturation: 97% Concomitant medications: Every visit: ? See med sheet  BMP Pre Contrast IV 1.15  CMP Post Contrast IV 12 hours later: 1.44 Hepatic Panel:  ALT: 27 Total Bili: 1.3 Direct Bili: 0.1   Medical History:  ? CAD ? Prior MI ? PAD  ? History of Heart Failure ? Moderate to severe valvular dx ? AFib  ? Prior Coronary Revascularization  if YES please select Yes or No below:  CABG ? Yes   ? No           PCI with stent ? Yes   ? No          PCI without stent ? Yes ? No  ? CVA if checked please select one of the following Choose an item.  ? Hypertension  ? Gilberts syndrome ? CKD   ? Hypocholesteremia ? DM ? Smoker        ? eCigarette  [place] Class Stage 1     EQ-5D-3L ?  Future Biomedical Research: Consented ? Yes      ? No If yes please date they withdrew consent from biomedical research Click or tap to enter a date.  Central Labs Before Start of Infusion: ? Biochemistry panel      ?  Hematology     ? Immunogenicity   (30 mins before infusion)   ? Parvovirus  ? FBR sample ? PK/PD sample Central Labs End of Infusion:   ? PK/PD Central Blood Draw Time: Before SOI: _1555____ After EOI: ___1810____ Infusion Start Time: 1600  Infusion End Time: 1740  Local labs drawn prior to randomization results.  Component     Latest Ref Rng & Units 06/18/2017 06/19/2017 06/19/2017         3:29 PM  4:39 AM  1:02 PM  Sodium     135 - 145 mmol/L 138 138 131 (L)  Potassium     3.5 - 5.1 mmol/L 4.0 3.9 3.8  Chloride     101 - 111 mmol/L 105 107 100 (L)  CO2     22 - 32 mmol/L 25 20 (L) 20 (L)  Glucose     65 - 99 mg/dL 139 (H) 145 (H) 195 (H)  BUN     6 - 20 mg/dL 17 19 20   Creatinine     0.61 - 1.24 mg/dL 1.15 1.22 1.44 (H)  Calcium     8.9 - 10.3 mg/dL 9.0 8.3 (L) 8.5 (L)  Total Protein     6.5 - 8.1 g/dL  5.7 (L) 6.7  Albumin     3.5 - 5.0 g/dL  3.1 (L) 3.7  AST     15 - 41 U/L  18 28  ALT     17 - 63 U/L  20 27  Alkaline Phosphatase     38 - 126 U/L  63 79  Total Bilirubin     0.3 - 1.2 mg/dL  1.1 1.3 (H)  GFR, Est Non African American     >60 mL/min 58 (L) 54 (L) 44 (L)  GFR, Est African American     >60 mL/min >60 >60 51 (L)  Anion gap     5 - 15 8 11 11    Component     Latest Ref Rng & Units 06/19/2017         4:39 AM  Total Protein     6.5 - 8.1 g/dL 5.7 (L)  Albumin     3.5 - 5.0 g/dL 3.1 (L)  AST     15 - 41 U/L 18  ALT     17 - 63 U/L 20  Alkaline Phosphatase     38 - 126 U/L 63  Total Bilirubin     0.3 - 1.2 mg/dL 1.1  Bilirubin, Direct     0.1 - 0.5 mg/dL 0.1  Indirect Bilirubin     0.3 - 0.9 mg/dL 1.0 (H)

## 2017-06-21 NOTE — Telephone Encounter (Signed)
Left detailed message-Marie Niblack(wife)

## 2017-06-21 NOTE — Telephone Encounter (Signed)
Patient contacted regarding discharge from [place].  Patient understands to follow up with Kerin Ransom PA 06/27/17 at 8:30 am. Patient understands discharge instructions. Patient understands medications and regiment. Patient understands to bring all medications to this visit.  Stated he is feeling good.No complaints.Stated he wanted to thank Dr.Crenshaw for all he did.

## 2017-06-21 NOTE — Telephone Encounter (Signed)
Follow up     Ryan Garcia calling back, states she can get her messages. Please call again

## 2017-06-21 NOTE — Consult Note (Signed)
            [facility] CM Primary Care Navigator  06/21/2017  Ryan Garcia 01/13/1934 [id number]   Attempt to seepatient at the bedside to identify discharge needs but he was alreadydischargedper staff report.  Patient was discharged home.  Primary care provider's officeis listed asprovidingtransition of care (TOC).  Patient has discharge instruction to follow-up with Kerin Ransom, [place] (cardiology) on 06/27/17 at 8:30 am.  . For additional questions please contact:  Edwena Felty A. Tobby Fawcett, BSN, RN-BC [facility] PRIMARY CARE Navigator Cell: [phone]

## 2017-06-21 NOTE — Research (Signed)
CENTRAL LABS FROM INFUSION 1: CBC AND CMP

## 2017-06-22 DIAGNOSIS — R0689 Other abnormalities of breathing: Secondary | ICD-10-CM | POA: Diagnosis not present

## 2017-06-22 DIAGNOSIS — G4733 Obstructive sleep apnea (adult) (pediatric): Secondary | ICD-10-CM | POA: Diagnosis not present

## 2017-06-24 DIAGNOSIS — M419 Scoliosis, unspecified: Secondary | ICD-10-CM | POA: Insufficient documentation

## 2017-06-24 DIAGNOSIS — M549 Dorsalgia, unspecified: Secondary | ICD-10-CM

## 2017-06-24 DIAGNOSIS — G4733 Obstructive sleep apnea (adult) (pediatric): Secondary | ICD-10-CM | POA: Insufficient documentation

## 2017-06-24 DIAGNOSIS — T7840XA Allergy, unspecified, initial encounter: Secondary | ICD-10-CM | POA: Insufficient documentation

## 2017-06-24 DIAGNOSIS — G8929 Other chronic pain: Secondary | ICD-10-CM | POA: Insufficient documentation

## 2017-06-24 DIAGNOSIS — M1990 Unspecified osteoarthritis, unspecified site: Secondary | ICD-10-CM | POA: Insufficient documentation

## 2017-06-24 DIAGNOSIS — Z9989 Dependence on other enabling machines and devices: Secondary | ICD-10-CM

## 2017-06-24 DIAGNOSIS — M7580 Other shoulder lesions, unspecified shoulder: Secondary | ICD-10-CM | POA: Insufficient documentation

## 2017-06-24 DIAGNOSIS — K219 Gastro-esophageal reflux disease without esophagitis: Secondary | ICD-10-CM | POA: Insufficient documentation

## 2017-06-24 DIAGNOSIS — Z85828 Personal history of other malignant neoplasm of skin: Secondary | ICD-10-CM | POA: Insufficient documentation

## 2017-06-24 DIAGNOSIS — E785 Hyperlipidemia, unspecified: Secondary | ICD-10-CM | POA: Insufficient documentation

## 2017-06-26 ENCOUNTER — Encounter: Payer: Medicare HMO | Admitting: *Deleted

## 2017-06-26 ENCOUNTER — Ambulatory Visit (HOSPITAL_COMMUNITY)
Admission: RE | Admit: 2017-06-26 | Discharge: 2017-06-26 | Disposition: A | Discharge status: Home / Self Care | Payer: Medicare HMO | Source: Ambulatory Visit | Attending: Internal Medicine | Admitting: Internal Medicine

## 2017-06-26 DIAGNOSIS — Z006 Encounter for examination for normal comparison and control in clinical research program: Secondary | ICD-10-CM | POA: Insufficient documentation

## 2017-06-26 LAB — BASIC METABOLIC PANEL
ANION GAP: 10 (ref 5–15)
BUN: 22 mg/dL — ABNORMAL HIGH (ref 6–20)
CALCIUM: 9 mg/dL (ref 8.9–10.3)
CO2: 24 mmol/L (ref 22–32)
CREATININE: 1.26 mg/dL — AB (ref 0.61–1.24)
Chloride: 105 mmol/L (ref 101–111)
GFR calc Af Amer: 60 mL/min — ABNORMAL LOW (ref >60)
GFR, EST NON AFRICAN AMERICAN: 52 mL/min — AB (ref >60)
GLUCOSE: 245 mg/dL — AB (ref 65–99)
Potassium: 4 mmol/L (ref 3.5–5.1)
Sodium: 139 mmol/L (ref 135–145)

## 2017-06-26 MED ORDER — STUDY - AEGIS II STUDY - PLACEBO OR CSL112 (PI-HILTY)
170.0000 mL | INTRAVENOUS | Status: DC
  Administered 2017-06-26: 12:00:00 170 mL via INTRAVENOUS
  Filled 2017-06-26: qty 170

## 2017-06-26 NOTE — Progress Notes (Signed)
                                  "  CONSENT"   YES     NO   Continuing further Investigational Product and study visits for follow-up?     X     Continuing consent from future biomedical research      X                                      "EVENTS"    YES     NO  AE   (IF YES SEE SOURCE)       X  SAE  (IF YES SEE SOURCE)       X  ENDPOINT   (IF YES SEE SOURCE)       X  REVASCULARIZATION  (IF YES SEE SOURCE)       X  AMPUTATION   (IF YES SEE SOURCE)       X  TROPONIN'S  (IF YES SEE SOURCE)       X    Local labs drawn see Epic results and central labs sent out.   Infusion given.   No complaints of chest pains or sob.   Reviewed meds with no changes.   Labs from central attached to office visit.

## 2017-06-27 ENCOUNTER — Ambulatory Visit: Payer: Medicare HMO | Admitting: Cardiology

## 2017-06-27 ENCOUNTER — Encounter: Payer: Self-pay | Admitting: *Deleted

## 2017-06-27 ENCOUNTER — Encounter: Payer: Self-pay | Admitting: Cardiology

## 2017-06-27 VITALS — BP 134/64 | HR 66 | Ht 72.0 in | Wt 258.2 lb

## 2017-06-27 DIAGNOSIS — Z9861 Coronary angioplasty status: Secondary | ICD-10-CM

## 2017-06-27 DIAGNOSIS — E119 Type 2 diabetes mellitus without complications: Secondary | ICD-10-CM | POA: Diagnosis not present

## 2017-06-27 DIAGNOSIS — Z794 Long term (current) use of insulin: Secondary | ICD-10-CM

## 2017-06-27 DIAGNOSIS — I2510 Atherosclerotic heart disease of native coronary artery without angina pectoris: Secondary | ICD-10-CM | POA: Diagnosis not present

## 2017-06-27 DIAGNOSIS — IMO0001 Reserved for inherently not codable concepts without codable children: Secondary | ICD-10-CM

## 2017-06-27 DIAGNOSIS — I214 Non-ST elevation (NSTEMI) myocardial infarction: Secondary | ICD-10-CM | POA: Diagnosis not present

## 2017-06-27 DIAGNOSIS — I10 Essential (primary) hypertension: Secondary | ICD-10-CM

## 2017-06-27 DIAGNOSIS — E782 Mixed hyperlipidemia: Secondary | ICD-10-CM | POA: Diagnosis not present

## 2017-06-27 NOTE — Assessment & Plan Note (Signed)
3 V CAD in 2012-2016. Pt elected medical Rx and did well until 06/17/17- dRCA PCI with DES 

## 2017-06-27 NOTE — Patient Instructions (Signed)
Medication Instructions:  NO CHANGES-Your physician recommends that you continue on your current medications as directed. Please refer to the Current Medication list given to you today.  If you need a refill on your cardiac medications before your next appointment, please call your pharmacy.  Labwork: LIPID AND CMET IN 6 WEEKS (~08-08-17) HERE IN OUR OFFICE AT [facility]  Take the provided lab slips for you to take with you to the lab for you blood draw.   You will need to fast. DO NOT EAT OR DRINK PAST MIDNIGHT.   You may go to any [facility] lab that is convenient for you however, we do have a lab in our office that is able to assist you. You do NOT need an appointment for our lab. Once in our office lobby there is a podium to the right of the check-in desk where you are to sign-in and ring a doorbell to alert [place] you are here. Lab is open Monday-Friday from 8:00am to 4:00pm; and is closed for lunch from 12:45p-1:45pm   Follow-Up: Your physician wants you to follow-up in: [place]   Thank you for choosing [facility] at [facility]!!

## 2017-06-27 NOTE — Assessment & Plan Note (Signed)
Controlled.  

## 2017-06-27 NOTE — Assessment & Plan Note (Signed)
NSTEMI 06/17/17- Troponin 0.64

## 2017-06-27 NOTE — Assessment & Plan Note (Signed)
Followed by PCP

## 2017-06-27 NOTE — Progress Notes (Signed)
06/27/2017 Ryan Garcia   1933-10-19  [id number]  Primary Physician [place], Wynelle Fanny, MD Primary Cardiologist: Dr Stanford Breed  HPI:  84 y/o male followed by Dr Stanford Breed with a history of CAD. He has had multivessel disease documented at cath in 2012 and again in 2016. He has expressed his desire to avoid CABG and has done well on medical Rx until he presented 06/17/17 with a NSTEMI. Cath was done and he underwent dRCA PCI with DES. He is in the office today for follow up. He has done well since discharge.    Current Outpatient Medications  Medication Sig Dispense Refill  . aspirin 81 MG tablet Take 81 mg by mouth daily.      . Blood Glucose Monitoring Suppl (TRUE METRIX AIR GLUCOSE METER) W/DEVICE KIT 1 kit by Other route 2 (two) times daily. Check blood sugar twice daily and as directed. Dx E11.65 1 kit 0  . cetirizine (ZYRTEC) 10 MG tablet TAKE 1 TABLET EVERY DAY 90 tablet 1  . Coenzyme Q10 (CO Q 10 PO) Take 1 tablet by mouth daily.     . Cyanocobalamin (VITAMIN B12 PO) Take 1 tablet by mouth 2 (two) times a week.     . fluorouracil (EFUDEX) 5 % cream Apply 2 application topically daily as needed (skin lesions).     Marland Kitchen glipiZIDE (GLUCOTROL) 10 MG tablet TAKE 1 TABLET TWICE DAILY WITH MEALS 180 tablet 1  . glucose blood (TRUE METRIX BLOOD GLUCOSE TEST) test strip CHECK BLOOD SUGAR TWICE DAILY AND AS NEEDED FOR DM (dx. E11.65) 300 each 1  . hydrochlorothiazide (HYDRODIURIL) 12.5 MG tablet Take 1 tablet (12.5 mg total) daily by mouth. 90 tablet 2  . Insulin Glargine (LANTUS SOLOSTAR) 100 UNIT/ML Solostar Pen INJECT  45 UNITS SUBCUTANEOUSLY AT BEDTIME 40 mL 3  . Insulin Pen Needle (UNIFINE PENTIPS) 31G X 6 MM MISC USE PEN NEEDLES WHEN TAKING LANTUS AT BEDTIME 100 each 1  . isosorbide mononitrate (IMDUR) 30 MG 24 hr tablet Take 1 tablet (30 mg total) daily by mouth. 90 tablet 1  . lisinopril (PRINIVIL,ZESTRIL) 10 MG tablet Take 1 tablet (10 mg total) daily by mouth. 90 tablet 2  . metFORMIN  (GLUCOPHAGE) 1000 MG tablet Take 1 tablet (1,000 mg total) by mouth 2 (two) times daily. Resume on 06/22/17. 180 tablet 3  . metoprolol tartrate (LOPRESSOR) 25 MG tablet Take 0.5 tablets (12.5 mg total) by mouth 2 (two) times daily. 30 tablet 3  . nitroGLYCERIN (NITROSTAT) 0.4 MG SL tablet Place 1 tablet (0.4 mg total) every 5 (five) minutes as needed under the tongue for chest pain. 25 tablet 3  . rosuvastatin (CRESTOR) 40 MG tablet Take 1 tablet (40 mg total) by mouth daily at 6 PM. 30 tablet 3  . ticagrelor (BRILINTA) 90 MG TABS tablet Take 1 tablet (90 mg total) by mouth 2 (two) times daily. 180 tablet 3  . triamcinolone (NASACORT) 55 MCG/ACT AERO nasal inhaler USE ONE SPRAYS EACH NOSTRIL EVERY DAY as needed    . TRUEPLUS LANCETS 30G MISC Check blood sugar twice daily and as directed. Dx E11.65 200 each 1   No current facility-administered medications for this visit.     Allergies  Allergen Reactions  . Loratadine     REACTION: not effective  . Simvastatin     REACTION: intolerant    Past Medical History:  Diagnosis Date  . AC (acromioclavicular) joint bone spurs    lt ankle  . Allergy  allergic rhinitis  . Arthritis    OA  . BPH (benign prostatic hyperplasia)    microwave tx of prostate  . CAD (coronary artery disease)    a. s/p cath in 2012 showing 70-75% LAD stenosis, 75% D1 stenosis, 75% OM1, 60-70% RCA stenosis, and 80% PDA --> medical therapy pursued b. similar findings by cath in 2016; 06/2017 DES to distal RCA  . Chronic upper back pain   . Diabetes mellitus    type II x8 years  . GERD (gastroesophageal reflux disease)   . HLD (hyperlipidemia)    10/12: TC 208, TG 213, HDL 36, LDL 129  . Hx of skin cancer, basal cell    many skin cancers removed/under constant treatment.  . Hypertension   . NSTEMI (non-ST elevated myocardial infarction) ([place]) 06/18/2017   DES to distal RCA  . Obstructive sleep apnea on CPAP   . Scoliosis     Social History   Socioeconomic  History  . Marital status: Married    Spouse name: Not on file  . Number of children: Not on file  . Years of education: Not on file  . Highest education level: Not on file  Social Needs  . Financial resource strain: Not on file  . Food insecurity - worry: Not on file  . Food insecurity - inability: Not on file  . Transportation needs - medical: Not on file  . Transportation needs - non-medical: Not on file  Occupational History  . Not on file  Tobacco Use  . Smoking status: Never Smoker  . Smokeless tobacco: Never Used  Substance and Sexual Activity  . Alcohol use: No    Alcohol/week: 0.0 oz  . Drug use: No  . Sexual activity: Not on file  Other Topics Concern  . Not on file  Social History Narrative   Married    Physically active hard of hearing     Family History  Problem Relation Age of Onset  . Heart attack Brother   . Colon cancer Neg Hx      Review of Systems: General: negative for chills, fever, night sweats or weight changes.  Cardiovascular: negative for chest pain, dyspnea on exertion, edema, orthopnea, palpitations, paroxysmal nocturnal dyspnea or shortness of breath Dermatological: negative for rash Respiratory: negative for cough or wheezing Urologic: negative for hematuria Abdominal: negative for nausea, vomiting, diarrhea, bright red blood per rectum, melena, or hematemesis Neurologic: negative for visual changes, syncope, or dizziness All other systems reviewed and are otherwise negative except as noted above.    Blood pressure 134/64, pulse 66, height 6' (1.829 m), weight 258 lb 3.2 oz (117.1 kg).  General appearance: alert, cooperative, no distress and moderately obese Neck: no carotid bruit and no JVD Lungs: clear to auscultation bilaterally Heart: regular rate and rhythm Extremities: extremities normal, atraumatic, no cyanosis or edema Skin: Skin color, texture, turgor normal. No rashes or lesions Neurologic: Grossly normal  EKG NSR,  PACs  ASSESSMENT AND PLAN:   NSTEMI (non-ST elevated myocardial infarction) ([place]) NSTEMI 06/17/17- Troponin 0.64  CAD S/P percutaneous coronary angioplasty 3 V CAD in 2012-2016. Pt elected medical Rx and did well until 06/17/17- dRCA PCI with DES  Essential hypertension Controlled  HLD (hyperlipidemia) Intolerant to Lipitor- changed to high dose Crestor after his NSTEMI Dec 2018 He'll need Lipids and LFTS in 6 weeks  Insulin dependent diabetes mellitus ([place]) Followed by PCP   PLAN  Check Lipids and Cmet- 6 weeks, Dr Stanford Breed 3 months.   Ryan Garcia  Jeptha Hinnenkamp PA-C 06/27/2017 9:14 AM

## 2017-06-27 NOTE — Assessment & Plan Note (Signed)
Intolerant to Lipitor- changed to high dose Crestor after his NSTEMI Dec 2018 He'll need Lipids and LFTS in 6 weeks

## 2017-06-28 ENCOUNTER — Encounter: Payer: Self-pay | Admitting: Internal Medicine

## 2017-07-03 ENCOUNTER — Ambulatory Visit (HOSPITAL_COMMUNITY)
Admission: RE | Admit: 2017-07-03 | Discharge: 2017-07-03 | Disposition: A | Discharge status: Home / Self Care | Payer: Medicare HMO | Source: Ambulatory Visit | Attending: Internal Medicine | Admitting: Internal Medicine

## 2017-07-03 ENCOUNTER — Encounter: Payer: Medicare HMO | Admitting: *Deleted

## 2017-07-03 VITALS — BP 127/55 | HR 52 | Temp 98.3°F

## 2017-07-03 DIAGNOSIS — Z006 Encounter for examination for normal comparison and control in clinical research program: Secondary | ICD-10-CM

## 2017-07-03 MED ORDER — STUDY - AEGIS II STUDY - PLACEBO OR CSL112 (PI-HILTY)
170.0000 mL | INTRAVENOUS | Status: DC
  Administered 2017-07-03: 11:00:00 170 mL via INTRAVENOUS
  Filled 2017-07-03: qty 170

## 2017-07-03 NOTE — Progress Notes (Signed)
                                  "  CONSENT"   YES     NO   Continuing further Investigational Product and study visits for follow-up?     X     Continuing consent from future biomedical research     X                                      "EVENTS"    YES     NO  AE   (IF YES SEE SOURCE)       X  SAE  (IF YES SEE SOURCE)       X  ENDPOINT   (IF YES SEE SOURCE)       X  REVASCULARIZATION  (IF YES SEE SOURCE)       X  AMPUTATION   (IF YES SEE SOURCE)       X  TROPONIN'S  (IF YES SEE SOURCE)       X   Central labs drawn today.  No complaints of pain, cp, or sob.  Medications reviewed and no changes have been made. See med list. VS today @ 1040 documented in chart

## 2017-07-05 ENCOUNTER — Other Ambulatory Visit: Payer: Self-pay | Admitting: *Deleted

## 2017-07-05 MED ORDER — INSULIN PEN NEEDLE 31G X 6 MM MISC: 1 refills | Status: DC

## 2017-07-10 ENCOUNTER — Ambulatory Visit (HOSPITAL_COMMUNITY)
Admission: RE | Admit: 2017-07-10 | Discharge: 2017-07-10 | Disposition: A | Discharge status: Home / Self Care | Payer: Medicare HMO | Source: Ambulatory Visit | Attending: Internal Medicine | Admitting: Internal Medicine

## 2017-07-10 ENCOUNTER — Encounter: Payer: Medicare HMO | Admitting: *Deleted

## 2017-07-10 DIAGNOSIS — Z006 Encounter for examination for normal comparison and control in clinical research program: Secondary | ICD-10-CM

## 2017-07-10 MED ORDER — STUDY - AEGIS II STUDY - PLACEBO OR CSL112 (PI-HILTY)
170.0000 mL | INTRAVENOUS | Status: DC
  Administered 2017-07-10: 170 mL via INTRAVENOUS
  Filled 2017-07-10: qty 170

## 2017-07-10 NOTE — Progress Notes (Signed)
                                  "  CONSENT"   YES     NO   Continuing further Investigational Product and study visits for follow-up? X       Continuing consent from future biomedical research X                                      "EVENTS"    YES     NO  AE   (IF YES SEE SOURCE)  X  SAE  (IF YES SEE SOURCE)  X  ENDPOINT   (IF YES SEE SOURCE)  X  REVASCULARIZATION  (IF YES SEE SOURCE)  X  AMPUTATION   (IF YES SEE SOURCE)  X  TROPONIN'S  (IF YES SEE SOURCE)  X     Pt doing well. No complaints of CP or SOB. Infusion given.  Medications no change, see list.   Only a PK drawn today.  Will see patient back in 1 week.

## 2017-07-16 DIAGNOSIS — H35372 Puckering of macula, left eye: Secondary | ICD-10-CM | POA: Diagnosis not present

## 2017-07-16 DIAGNOSIS — H2511 Age-related nuclear cataract, right eye: Secondary | ICD-10-CM | POA: Diagnosis not present

## 2017-07-16 LAB — HM DIABETES EYE EXAM

## 2017-07-17 ENCOUNTER — Encounter: Payer: Medicare HMO | Admitting: *Deleted

## 2017-07-17 VITALS — BP 143/54 | HR 77 | Temp 98.0°F | Resp 16 | Ht 72.0 in | Wt 254.8 lb

## 2017-07-17 DIAGNOSIS — Z006 Encounter for examination for normal comparison and control in clinical research program: Secondary | ICD-10-CM

## 2017-07-17 NOTE — Progress Notes (Signed)
Patient seen and physical exam performed.  Patient stable. Lungs clear and cardiac exam unchanged.  No peripheral edema.  Patient has completed all four infusions.  TS                                   "CONSENT"   YES     NO   Continuing further Investigational Product and study visits for follow-up?     X     Continuing consent from future biomedical research     X                                      "EVENTS"    YES     NO  AE   (IF YES SEE SOURCE)       X  SAE  (IF YES SEE SOURCE)       X  ENDPOINT   (IF YES SEE SOURCE)       X  REVASCULARIZATION  (IF YES SEE SOURCE)       X  AMPUTATION   (IF YES SEE SOURCE)       X  TROPONIN'S  (IF YES SEE SOURCE)       X   Dr Lia Foyer saw patient for physical exam.  NO medication changes. Central labs drawn today.

## 2017-07-22 ENCOUNTER — Telehealth: Payer: Self-pay | Admitting: Cardiology

## 2017-07-22 DIAGNOSIS — R0689 Other abnormalities of breathing: Secondary | ICD-10-CM | POA: Diagnosis not present

## 2017-07-22 DIAGNOSIS — G4733 Obstructive sleep apnea (adult) (pediatric): Secondary | ICD-10-CM | POA: Diagnosis not present

## 2017-07-22 NOTE — Telephone Encounter (Signed)
Returned the call to the patient's wife per the dpr. She stated that insurance is now charging too much for the rosuvastatin and they can no longer afford it. The patient is currently out and has been taking his pravastatin (since Saturday) which he had been prescribed in the past. He has been taking 80 mg of the pravastatin. Message will be routed to the provider and PharmD for their recommendation.

## 2017-07-22 NOTE — Telephone Encounter (Signed)
Continue pravachol and check lipids and liver 6 weeks Kirk Ruths

## 2017-07-22 NOTE — Telephone Encounter (Signed)
° °  New message  Pt  Wife verbalized that she is calling for the RN

## 2017-07-23 ENCOUNTER — Ambulatory Visit: Payer: Medicare HMO | Admitting: Cardiology

## 2017-07-23 DIAGNOSIS — G4733 Obstructive sleep apnea (adult) (pediatric): Secondary | ICD-10-CM | POA: Diagnosis not present

## 2017-07-23 DIAGNOSIS — R0689 Other abnormalities of breathing: Secondary | ICD-10-CM | POA: Diagnosis not present

## 2017-07-23 MED ORDER — PRAVASTATIN SODIUM 80 MG PO TABS: 80.0000 mg | ORAL_TABLET | Freq: Every evening | ORAL | 3 refills | Status: DC

## 2017-07-23 NOTE — Telephone Encounter (Signed)
Spoke with pt wife, Aware of dr crenshaw's recommendations.  

## 2017-08-01 NOTE — Progress Notes (Signed)
As the principal investigator for the AEGIS-II Trial at [facility], I have reviewed the labs and have noted the following discrepancies: normal.  Pixie Casino, MD, [facility], [place] Director of the Advanced Lipid Disorders &  Cardiovascular Risk Reduction Clinic Diplomate of the American Board of Clinical Lipidology Attending Cardiologist  Direct Dial: [phone]  Fax: [phone]  Website:  [email or web address]

## 2017-08-07 ENCOUNTER — Telehealth: Payer: Self-pay | Admitting: *Deleted

## 2017-08-07 NOTE — Progress Notes (Signed)
Late entry Dr Debara Pickett reviewed labs the morning before infusion on 06/26/17 Local labs   Component     Latest Ref Rng & Units 06/26/2017          Sodium     135 - 145 mmol/L 139  Potassium     3.5 - 5.1 mmol/L 4.0  Chloride     101 - 111 mmol/L 105  CO2     22 - 32 mmol/L 24  Glucose     65 - 99 mg/dL 245 (H)  BUN     6 - 20 mg/dL 22 (H)  Creatinine     0.61 - 1.24 mg/dL 1.26 (H)  Calcium     8.9 - 10.3 mg/dL 9.0  GFR, Est Non African American     >60 mL/min 52 (L)  GFR, Est African American     >60 mL/min 60 (L)  Anion gap     5 - 15 10

## 2017-08-08 DIAGNOSIS — I214 Non-ST elevation (NSTEMI) myocardial infarction: Secondary | ICD-10-CM | POA: Diagnosis not present

## 2017-08-08 NOTE — Telephone Encounter (Signed)
Open in error

## 2017-08-09 LAB — LIPID PANEL
Chol/HDL Ratio: 3.4 ratio (ref 0.0–5.0)
Cholesterol, Total: 127 mg/dL (ref 100–199)
HDL: 37 mg/dL — ABNORMAL LOW (ref >39)
LDL Calculated: 74 mg/dL (ref 0–99)
Triglycerides: 81 mg/dL (ref 0–149)
VLDL Cholesterol Cal: 16 mg/dL (ref 5–40)

## 2017-08-09 LAB — COMPREHENSIVE METABOLIC PANEL
ALT: 20 IU/L (ref 0–44)
AST: 16 IU/L (ref 0–40)
Albumin/Globulin Ratio: 1.5 (ref 1.2–2.2)
Albumin: 4.1 g/dL (ref 3.5–4.7)
Alkaline Phosphatase: 94 IU/L (ref 39–117)
BUN/Creatinine Ratio: 15 (ref 10–24)
BUN: 20 mg/dL (ref 8–27)
Bilirubin Total: 0.5 mg/dL (ref 0.0–1.2)
CO2: 21 mmol/L (ref 20–29)
Calcium: 9.5 mg/dL (ref 8.6–10.2)
Chloride: 108 mmol/L — ABNORMAL HIGH (ref 96–106)
Creatinine, Ser: 1.35 mg/dL — ABNORMAL HIGH (ref 0.76–1.27)
GFR calc Af Amer: 57 mL/min/{1.73_m2} — ABNORMAL LOW (ref >59)
GFR calc non Af Amer: 49 mL/min/{1.73_m2} — ABNORMAL LOW (ref >59)
Globulin, Total: 2.7 g/dL (ref 1.5–4.5)
Glucose: 165 mg/dL — ABNORMAL HIGH (ref 65–99)
Potassium: 4.6 mmol/L (ref 3.5–5.2)
Sodium: 144 mmol/L (ref 134–144)
Total Protein: 6.8 g/dL (ref 6.0–8.5)

## 2017-08-12 ENCOUNTER — Encounter: Payer: Self-pay | Admitting: *Deleted

## 2017-08-12 ENCOUNTER — Telehealth: Payer: Self-pay | Admitting: *Deleted

## 2017-08-15 NOTE — Progress Notes (Signed)
Visit 5  PK collection report

## 2017-08-23 DIAGNOSIS — R0689 Other abnormalities of breathing: Secondary | ICD-10-CM | POA: Diagnosis not present

## 2017-08-23 DIAGNOSIS — G4733 Obstructive sleep apnea (adult) (pediatric): Secondary | ICD-10-CM | POA: Diagnosis not present

## 2017-08-28 NOTE — Telephone Encounter (Signed)
As the principal investigator for the AEGIS-II Trial at [facility] I have reviewed the chart. Rosuvastatin was switched to pravastatin, apparently due to cost, although I would recommend high potency statin given his recent coronary event - there is no data for pravastatin regarding risk reduction for secondary prevention in coronary disease. Atorvastatin 40 mg or 80 mg daily is an alternative, if he can tolerate it. No SAE's or AE's noted. Continue with infusion protocol.  Pixie Casino, MD, [facility], [place] Director of the Advanced Lipid Disorders &  Cardiovascular Risk Reduction Clinic Diplomate of the American Board of Clinical Lipidology Attending Cardiologist  Direct Dial: [phone]  Fax: [phone]  Website:  [email or web address]

## 2017-08-28 NOTE — Telephone Encounter (Signed)
Visit 7 phone follow up:  Late Entry Pt doing well. They changed his statin to pravastatin due to cost of rosuvastatin. He said no chest pains. Getting out and working around the house as much as he can.  No labs were drawn today. Will call back in March to schedule appointment for visit 8.                                     "CONSENT"   YES     NO   Continuing further Investigational Product and study visits for follow-up?     X     Continuing consent from future biomedical research     X                                      "EVENTS"    YES     NO  AE   (IF YES SEE SOURCE)      X  SAE  (IF YES SEE SOURCE)     X  ENDPOINT   (IF YES SEE SOURCE)      X  REVASCULARIZATION  (IF YES SEE SOURCE)      X  AMPUTATION   (IF YES SEE SOURCE)      X  TROPONIN'S  (IF YES SEE SOURCE)      X

## 2017-09-12 DIAGNOSIS — L570 Actinic keratosis: Secondary | ICD-10-CM | POA: Diagnosis not present

## 2017-09-12 DIAGNOSIS — D485 Neoplasm of uncertain behavior of skin: Secondary | ICD-10-CM | POA: Diagnosis not present

## 2017-09-12 DIAGNOSIS — D0439 Carcinoma in situ of skin of other parts of face: Secondary | ICD-10-CM | POA: Diagnosis not present

## 2017-09-12 DIAGNOSIS — D2271 Melanocytic nevi of right lower limb, including hip: Secondary | ICD-10-CM | POA: Diagnosis not present

## 2017-09-12 DIAGNOSIS — Z85828 Personal history of other malignant neoplasm of skin: Secondary | ICD-10-CM | POA: Diagnosis not present

## 2017-09-12 DIAGNOSIS — D225 Melanocytic nevi of trunk: Secondary | ICD-10-CM | POA: Diagnosis not present

## 2017-09-12 DIAGNOSIS — D2261 Melanocytic nevi of right upper limb, including shoulder: Secondary | ICD-10-CM | POA: Diagnosis not present

## 2017-09-12 DIAGNOSIS — X32XXXA Exposure to sunlight, initial encounter: Secondary | ICD-10-CM | POA: Diagnosis not present

## 2017-09-13 ENCOUNTER — Encounter: Payer: Self-pay | Admitting: Cardiology

## 2017-09-16 ENCOUNTER — Other Ambulatory Visit: Payer: Self-pay | Admitting: *Deleted

## 2017-09-16 MED ORDER — GLIPIZIDE 10 MG PO TABS: 10.0000 mg | ORAL_TABLET | Freq: Two times a day (BID) | ORAL | 1 refills | Status: DC

## 2017-09-16 MED ORDER — METFORMIN HCL 1000 MG PO TABS: 1000.0000 mg | ORAL_TABLET | Freq: Two times a day (BID) | ORAL | 1 refills | Status: DC

## 2017-09-16 NOTE — Progress Notes (Signed)
HPI: FU CAD.  Cardiac catheterization in the past has revealed significant three-vessel coronary artery disease.  I reviewed those films with Dr. Percival Spanish and Dr. Lia Foyer and we felt medical therapy best option (pt also wanted to avoid CABG). Patient admitted December 2018 and ruled in for non-ST elevation myocardial infarction.  Patient underwent repeat catheterization revealing severe three-vessel coronary disease.  Left-sided disease was unchanged compared to previous catheterizations and the culprit was felt to be a new RCA lesion and he had PCI with DES.  Echocardiogram December 2018 showed normal LV systolic function and mild left atrial enlargement.  Patient had an episode of hematuria 2 days ago.  He was seen by primary care and urology evaluation recommended.  Since he was last seen, he does have dyspnea on exertion but no orthopnea, PND, pedal edema, chest pain or syncope.  Current Outpatient Medications  Medication Sig Dispense Refill  . aspirin 81 MG tablet Take 81 mg by mouth daily.      . Blood Glucose Monitoring Suppl (TRUE METRIX AIR GLUCOSE METER) W/DEVICE KIT 1 kit by Other route 2 (two) times daily. Check blood sugar twice daily and as directed. Dx E11.65 1 kit 0  . cetirizine (ZYRTEC) 10 MG tablet TAKE 1 TABLET EVERY DAY 90 tablet 1  . Coenzyme Q10 (CO Q 10 PO) Take 1 tablet by mouth daily.     . Cyanocobalamin (VITAMIN B12 PO) Take 1 tablet by mouth 2 (two) times a week.     . fluorouracil (EFUDEX) 5 % cream Apply 2 application topically daily as needed (skin lesions).     Marland Kitchen glipiZIDE (GLUCOTROL) 10 MG tablet Take 1 tablet (10 mg total) by mouth 2 (two) times daily with a meal. 180 tablet 1  . glucose blood (TRUE METRIX BLOOD GLUCOSE TEST) test strip CHECK BLOOD SUGAR TWICE DAILY AND AS NEEDED FOR DM (dx. E11.65) 300 each 1  . hydrochlorothiazide (HYDRODIURIL) 12.5 MG tablet Take 1 tablet (12.5 mg total) daily by mouth. 90 tablet 2  . Insulin Glargine (LANTUS SOLOSTAR) 100  UNIT/ML Solostar Pen INJECT  45 UNITS SUBCUTANEOUSLY AT BEDTIME 40 mL 3  . Insulin Pen Needle (UNIFINE PENTIPS) 31G X 6 MM MISC USE PEN NEEDLES WHEN TAKING LANTUS AT BEDTIME 100 each 1  . isosorbide mononitrate (IMDUR) 30 MG 24 hr tablet Take 1 tablet (30 mg total) daily by mouth. 90 tablet 1  . lisinopril (PRINIVIL,ZESTRIL) 10 MG tablet Take 1 tablet (10 mg total) daily by mouth. 90 tablet 2  . metFORMIN (GLUCOPHAGE) 1000 MG tablet Take 1 tablet (1,000 mg total) by mouth 2 (two) times daily. Resume on 06/22/17. 180 tablet 1  . metoprolol tartrate (LOPRESSOR) 25 MG tablet Take 0.5 tablets (12.5 mg total) by mouth 2 (two) times daily. 30 tablet 3  . nitroGLYCERIN (NITROSTAT) 0.4 MG SL tablet Place 1 tablet (0.4 mg total) every 5 (five) minutes as needed under the tongue for chest pain. 25 tablet 3  . pravastatin (PRAVACHOL) 80 MG tablet Take 1 tablet (80 mg total) by mouth every evening. 90 tablet 3  . ticagrelor (BRILINTA) 90 MG TABS tablet Take 1 tablet (90 mg total) by mouth 2 (two) times daily. 180 tablet 3  . triamcinolone (NASACORT) 55 MCG/ACT AERO nasal inhaler USE ONE SPRAYS EACH NOSTRIL EVERY DAY as needed    . TRUEPLUS LANCETS 30G MISC Check blood sugar twice daily and as directed. Dx E11.65 200 each 1   No current facility-administered medications for  this visit.      Past Medical History:  Diagnosis Date  . AC (acromioclavicular) joint bone spurs    lt ankle  . Allergy    allergic rhinitis  . Arthritis    OA  . BPH (benign prostatic hyperplasia)    microwave tx of prostate  . CAD (coronary artery disease)    a. s/p cath in 2012 showing 70-75% LAD stenosis, 75% D1 stenosis, 75% OM1, 60-70% RCA stenosis, and 80% PDA --> medical therapy pursued b. similar findings by cath in 2016; 06/2017 DES to distal RCA  . Chronic upper back pain   . Diabetes mellitus    type II x8 years  . GERD (gastroesophageal reflux disease)   . HLD (hyperlipidemia)    10/12: TC 208, TG 213, HDL 36,  LDL 129  . Hx of skin cancer, basal cell    many skin cancers removed/under constant treatment.  . Hypertension   . NSTEMI (non-ST elevated myocardial infarction) ([place]) 06/18/2017   DES to distal RCA  . Obstructive sleep apnea on CPAP   . Scoliosis     Past Surgical History:  Procedure Laterality Date  . BREAST SURGERY  2009   breast lump removed benign  . CARDIAC CATHETERIZATION N/A 03/15/2015   Procedure: Left Heart Cath and Coronary Angiography;  Surgeon: Belva Crome, MD;  Location: [facility];  Service: Cardiovascular;  Laterality: N/A;  . CORONARY STENT INTERVENTION N/A 06/18/2017   Procedure: CORONARY STENT INTERVENTION;  Surgeon: Jettie Booze, MD;  Location: [facility];  Service: Cardiovascular;  Laterality: N/A;  RCA  . LEFT HEART CATH AND CORONARY ANGIOGRAPHY N/A 06/18/2017   Procedure: LEFT HEART CATH AND CORONARY ANGIOGRAPHY;  Surgeon: Jettie Booze, MD;  Location: [facility];  Service: Cardiovascular;  Laterality: N/A;  . ULTRASOUND GUIDANCE FOR VASCULAR ACCESS  06/18/2017   Procedure: Ultrasound Guidance For Vascular Access;  Surgeon: Jettie Booze, MD;  Location: [facility];  Service: Cardiovascular;;  right radial, right femoral    Social History   Socioeconomic History  . Marital status: Married    Spouse name: Not on file  . Number of children: Not on file  . Years of education: Not on file  . Highest education level: Not on file  Occupational History  . Not on file  Social Needs  . Financial resource strain: Not on file  . Food insecurity:    Worry: Not on file    Inability: Not on file  . Transportation needs:    Medical: Not on file    Non-medical: Not on file  Tobacco Use  . Smoking status: Never Smoker  . Smokeless tobacco: Never Used  Substance and Sexual Activity  . Alcohol use: No    Alcohol/week: 0.0 oz  . Drug use: No  . Sexual activity: Not on file  Lifestyle  . Physical activity:     Days per week: Not on file    Minutes per session: Not on file  . Stress: Not on file  Relationships  . Social connections:    Talks on phone: Not on file    Gets together: Not on file    Attends religious service: Not on file    Active member of club or organization: Not on file    Attends meetings of clubs or organizations: Not on file    Relationship status: Not on file  . Intimate partner violence:    Fear of current  or ex partner: Not on file    Emotionally abused: Not on file    Physically abused: Not on file    Forced sexual activity: Not on file  Other Topics Concern  . Not on file  Social History Narrative   Married    Physically active hard of hearing    Family History  Problem Relation Age of Onset  . Heart attack Brother   . Colon cancer Neg Hx     ROS: no fevers or chills, productive cough, hemoptysis, dysphasia, odynophagia, melena, hematochezia, dysuria, hematuria, rash, seizure activity, orthopnea, PND, pedal edema, claudication. Remaining systems are negative.  Physical Exam: Well-developed well-nourished in no acute distress.  Skin is warm and dry.  HEENT is normal.  Neck is supple.  Chest is clear to auscultation with normal expansion.  Cardiovascular exam is regular rate and rhythm.  Abdominal exam nontender or distended. No masses palpated. Extremities show no edema. neuro grossly intact  ECG-sinus rhythm with first-degree AV block.  Occasional PAC.  Personally reviewed.  A/P  1 coronary artery disease-patient doing well with no recurrent chest pain.  Plan to continue medical therapy (he would prefer to avoid coronary artery bypass graft).  Continue aspirin, Brilinta and statin.  2 hypertension-blood pressure is elevated.  Increase lisinopril to 20 mg daily and follow.  Check potassium and renal function in 1 week.  3 hyperlipidemia-Patient did not tolerate Lipitor in the past.  He states Crestor was too expensive.  Continue pravachol.  4  dyspnea-patient has some dyspnea on exertion.  This is likely multifactorial including obesity and deconditioning.  5 hematuria-I have encouraged him to follow-up with urology concerning this issue.  Kirk Ruths, MD

## 2017-09-20 DIAGNOSIS — G4733 Obstructive sleep apnea (adult) (pediatric): Secondary | ICD-10-CM | POA: Diagnosis not present

## 2017-09-20 DIAGNOSIS — R0689 Other abnormalities of breathing: Secondary | ICD-10-CM | POA: Diagnosis not present

## 2017-09-21 ENCOUNTER — Other Ambulatory Visit: Payer: Self-pay

## 2017-09-21 ENCOUNTER — Encounter (HOSPITAL_COMMUNITY): Payer: Self-pay | Admitting: Emergency Medicine

## 2017-09-21 DIAGNOSIS — Z7901 Long term (current) use of anticoagulants: Secondary | ICD-10-CM | POA: Insufficient documentation

## 2017-09-21 DIAGNOSIS — R319 Hematuria, unspecified: Secondary | ICD-10-CM | POA: Insufficient documentation

## 2017-09-21 DIAGNOSIS — R300 Dysuria: Secondary | ICD-10-CM | POA: Diagnosis not present

## 2017-09-21 DIAGNOSIS — Z5321 Procedure and treatment not carried out due to patient leaving prior to being seen by health care provider: Secondary | ICD-10-CM | POA: Diagnosis not present

## 2017-09-21 NOTE — ED Triage Notes (Signed)
Pt is on blood thinner since last December today he had blood on the urine with burning sensation when he urinate.

## 2017-09-22 ENCOUNTER — Emergency Department (HOSPITAL_COMMUNITY)
Admission: EM | Admit: 2017-09-22 | Discharge: 2017-09-22 | Disposition: A | Discharge status: Home / Self Care | Payer: Medicare HMO | Attending: Emergency Medicine | Admitting: Emergency Medicine

## 2017-09-22 LAB — URINALYSIS, ROUTINE W REFLEX MICROSCOPIC
Bacteria, UA: NONE SEEN
Bilirubin Urine: NEGATIVE
Glucose, UA: >=500 mg/dL — AB
Ketones, ur: NEGATIVE mg/dL
Leukocytes, UA: NEGATIVE
NITRITE: NEGATIVE
PH: 5 (ref 5.0–8.0)
Protein, ur: NEGATIVE mg/dL
SPECIFIC GRAVITY, URINE: 1.017 (ref 1.005–1.030)
Squamous Epithelial / LPF: NONE SEEN

## 2017-09-22 LAB — BASIC METABOLIC PANEL
Anion gap: 8 (ref 5–15)
BUN: 16 mg/dL (ref 6–20)
CHLORIDE: 109 mmol/L (ref 101–111)
CO2: 24 mmol/L (ref 22–32)
CREATININE: 1.16 mg/dL (ref 0.61–1.24)
Calcium: 9 mg/dL (ref 8.9–10.3)
GFR calc Af Amer: >60 mL/min (ref >60)
GFR calc non Af Amer: 57 mL/min — ABNORMAL LOW (ref >60)
GLUCOSE: 227 mg/dL — AB (ref 65–99)
Potassium: 4.1 mmol/L (ref 3.5–5.1)
Sodium: 141 mmol/L (ref 135–145)

## 2017-09-22 LAB — CBC WITH DIFFERENTIAL/PLATELET
Basophils Absolute: 0 10*3/uL (ref 0.0–0.1)
Basophils Relative: 0 %
EOS PCT: 4 %
Eosinophils Absolute: 0.3 10*3/uL (ref 0.0–0.7)
HCT: 40.9 % (ref 39.0–52.0)
HEMOGLOBIN: 13.1 g/dL (ref 13.0–17.0)
LYMPHS ABS: 1.9 10*3/uL (ref 0.7–4.0)
LYMPHS PCT: 28 %
MCH: 30.3 pg (ref 26.0–34.0)
MCHC: 32 g/dL (ref 30.0–36.0)
MCV: 94.7 fL (ref 78.0–100.0)
MONOS PCT: 11 %
Monocytes Absolute: 0.8 10*3/uL (ref 0.1–1.0)
NEUTROS PCT: 57 %
Neutro Abs: 3.9 10*3/uL (ref 1.7–7.7)
Platelets: 177 10*3/uL (ref 150–400)
RBC: 4.32 MIL/uL (ref 4.22–5.81)
RDW: 15.3 % (ref 11.5–15.5)
WBC: 6.9 10*3/uL (ref 4.0–10.5)

## 2017-09-22 LAB — PROTIME-INR
INR: 1.03
Prothrombin Time: 13.4 seconds (ref 11.4–15.2)

## 2017-09-23 ENCOUNTER — Ambulatory Visit (INDEPENDENT_AMBULATORY_CARE_PROVIDER_SITE_OTHER): Payer: Medicare HMO | Admitting: Family Medicine

## 2017-09-23 ENCOUNTER — Telehealth: Payer: Self-pay

## 2017-09-23 ENCOUNTER — Encounter: Payer: Self-pay | Admitting: Family Medicine

## 2017-09-23 VITALS — BP 136/68 | HR 64 | Temp 98.3°F | Ht 72.0 in | Wt 259.2 lb

## 2017-09-23 DIAGNOSIS — R310 Gross hematuria: Secondary | ICD-10-CM | POA: Diagnosis not present

## 2017-09-23 DIAGNOSIS — I10 Essential (primary) hypertension: Secondary | ICD-10-CM | POA: Diagnosis not present

## 2017-09-23 DIAGNOSIS — R319 Hematuria, unspecified: Secondary | ICD-10-CM

## 2017-09-23 LAB — POC URINALSYSI DIPSTICK (AUTOMATED)
Bilirubin, UA: NEGATIVE
Glucose, UA: 1000
Ketones, UA: NEGATIVE
Leukocytes, UA: NEGATIVE
Nitrite, UA: NEGATIVE
PH UA: 6 (ref 5.0–8.0)
PROTEIN UA: NEGATIVE
RBC UA: NEGATIVE
SPEC GRAV UA: 1.02 (ref 1.010–1.025)
UROBILINOGEN UA: 0.2 U/dL

## 2017-09-23 NOTE — Patient Instructions (Signed)
We would ordinarily refer you to urology for further evaluation- so if symptoms come back please let us know immediately  (since you do not want to go)   Urine looks better today (however I suspect blood sugar is high)   Make sure to drink water  I also urge you to watch diet for diabetes

## 2017-09-23 NOTE — Telephone Encounter (Signed)
PLEASE NOTE: All timestamps contained within this report are represented as [place] Standard Time. CONFIDENTIALTY NOTICE: This fax transmission is intended only for the addressee. It contains information that is legally privileged, confidential or otherwise protected from use or disclosure. If you are not the intended recipient, you are strictly prohibited from reviewing, disclosing, copying using or disseminating any of this information or taking any action in reliance on or regarding this information. If you have received this fax in error, please notify us immediately by telephone so that we can arrange for its return to [place]. Phone: [phone], Toll-Free: [phone], Fax: [phone] Page: 1 of 2 Call Id: [id number] [place] Patient Name: Ryan Garcia Gender: Male DOB: [birth year] Age: 84 Y 39 M 3 D Return Phone Number: [phone] (Primary), [phone] (Secondary) [place] [address] Client [facility] Night - Client Client Site [facility], [place] MD Contact Type Call Who Is Calling Patient / Member / Family / Caregiver Call Type Triage / Clinical Caller Name Usiel Astarita Relationship To Patient Spouse Return Phone Number [phone] (Primary) Chief Complaint Urine, Blood In Reason for Call Symptomatic / Request for [place] states their husband is passing blood red urine. Other sx include painful urination. Translation No Nurse Assessment Nurse: Windle Guard, RN, Lesa Date/Time (Eastern Time): 09/21/2017 9:36:02 PM Confirm and document reason for call. If symptomatic, describe symptoms. ---Caller states her husband has blood in his urine Does the patient have any new or worsening symptoms? ---Yes Will a triage be completed? ---Yes Related visit to physician  within the last 2 weeks? ---No Does the PT have any chronic conditions? (i.e. diabetes, asthma, etc.) ---Yes List chronic conditions. ---heart disease, diabetes, heart stents, heart blockage, HTN, Is this a behavioral health or substance abuse call? ---No Guidelines Guideline Title Affirmed Question Affirmed Notes Nurse Date/Time (Eastern Time) Urine - Blood In Passing pure blood or large blood clots (i.e., size > a dime) (Exception: fleck or small strands) Conner, RN, Lesa 09/21/2017 9:37:16 PM Disp. Time Eilene Ghazi Time) Disposition Final User 09/21/2017 8:28:23 PM Send To Nurse Assign Solocinski, RN, Beth 09/21/2017 9:42:16 PM Go to ED Now Yes Windle Guard, RN, Lesa Caller Disagree/Comply Comply PLEASE NOTE: All timestamps contained within this report are represented as [place] Standard Time. CONFIDENTIALTY NOTICE: This fax transmission is intended only for the addressee. It contains information that is legally privileged, confidential or otherwise protected from use or disclosure. If you are not the intended recipient, you are strictly prohibited from reviewing, disclosing, copying using or disseminating any of this information or taking any action in reliance on or regarding this information. If you have received this fax in error, please notify us immediately by telephone so that we can arrange for its return to [place]. Phone: [phone], Toll-Free: [phone], Fax: [phone] Page: 2 of 2 Call Id: [id number] [place] Understands Yes PreDisposition Go to ED Care Advice Given Per Guideline GO TO ED NOW: You need to be seen in the Emergency Department. Go to the ER at ___________ [place] now. Drive carefully. DRIVING: Another adult should drive. BRING MEDICINES: * It is also a good idea to bring the pill bottles too. This will help the doctor to make certain you are taking the right medicines and the right dose. CARE ADVICE given per Urine, Blood In (Adult) guideline. Referrals [facility] - ED

## 2017-09-23 NOTE — Assessment & Plan Note (Signed)
Episode of gross hematuria in pt on Brillinta for anticoagulation  Now resolved  Rev ED lab/ua - (no notes as he left w/o being seen) He declines ref to urology at this time  UA is clear now  Unsure of cause - disc need for cystoscopy  He chooses to wait on this and call back if symptoms re occur

## 2017-09-23 NOTE — Progress Notes (Signed)
Subjective:    Patient ID: Ryan Garcia, male    DOB: May 15, 1934, 84 y.o.   MRN: [id number]  HPI Here for episode of gross hematuria   Went to ED on 3/23 and he left w/o being seen after a very long wait  The following labs were done   He urinated on Sat at 8 pm and saw blood in commode (quite a bit)  It was hard to start urinating and it burned  Happened 2 other times (once there was a clot) No back or flank or abd pain  No other symptoms No fever or nausea or illness   Results for orders placed or performed during the hospital encounter of 09/22/17  Urinalysis, Routine w reflex microscopic  Result Value Ref Range   Color, Urine STRAW (A) YELLOW   APPearance CLEAR CLEAR   Specific Gravity, Urine 1.017 1.005 - 1.030   pH 5.0 5.0 - 8.0   Glucose, UA >=500 (A) NEGATIVE mg/dL   Hgb urine dipstick LARGE (A) NEGATIVE   Bilirubin Urine NEGATIVE NEGATIVE   Ketones, ur NEGATIVE NEGATIVE mg/dL   Protein, ur NEGATIVE NEGATIVE mg/dL   Nitrite NEGATIVE NEGATIVE   Leukocytes, UA NEGATIVE NEGATIVE   RBC / HPF TOO NUMEROUS TO COUNT 0 - 5 RBC/hpf   WBC, UA 6-30 0 - 5 WBC/hpf   Bacteria, UA NONE SEEN NONE SEEN   Squamous Epithelial / LPF NONE SEEN NONE SEEN  CBC with Differential  Result Value Ref Range   WBC 6.9 4.0 - 10.5 K/uL   RBC 4.32 4.22 - 5.81 MIL/uL   Hemoglobin 13.1 13.0 - 17.0 g/dL   HCT 40.9 39.0 - 52.0 %   MCV 94.7 78.0 - 100.0 fL   MCH 30.3 26.0 - 34.0 pg   MCHC 32.0 30.0 - 36.0 g/dL   RDW 15.3 11.5 - 15.5 %   Platelets 177 150 - 400 K/uL   Neutrophils Relative % 57 %   Neutro Abs 3.9 1.7 - 7.7 K/uL   Lymphocytes Relative 28 %   Lymphs Abs 1.9 0.7 - 4.0 K/uL   Monocytes Relative 11 %   Monocytes Absolute 0.8 0.1 - 1.0 K/uL   Eosinophils Relative 4 %   Eosinophils Absolute 0.3 0.0 - 0.7 K/uL   Basophils Relative 0 %   Basophils Absolute 0.0 0.0 - 0.1 K/uL  Basic metabolic panel  Result Value Ref Range   Sodium 141 135 - 145 mmol/L   Potassium 4.1 3.5 - 5.1  mmol/L   Chloride 109 101 - 111 mmol/L   CO2 24 22 - 32 mmol/L   Glucose, Bld 227 (H) 65 - 99 mg/dL   BUN 16 6 - 20 mg/dL   Creatinine, Ser 1.16 0.61 - 1.24 mg/dL   Calcium 9.0 8.9 - 10.3 mg/dL   GFR calc non Af Amer 57 (L) >60 mL/min   GFR calc Af Amer >60 >60 mL/min   Anion gap 8 5 - 15  Protime-INR  Result Value Ref Range   Prothrombin Time 13.4 11.4 - 15.2 seconds   INR 1.03     Blood in urine  Glucose in urine  INR of 1.03  Nl cbc   He takes brilinta for CAD req angioplasty/stents   BP Readings from Last 3 Encounters:  09/23/17 136/68  09/21/17 (!) 144/57  07/17/17 (!) 143/54     35.16 kg/m  Wt Readings from Last 3 Encounters:  09/23/17 259 lb 4 oz (117.6 kg)  09/21/17 254  lb (115.2 kg)  07/17/17 254 lb 12.8 oz (115.6 kg)    H/o BPH Lab Results  Component Value Date   PSA 2.25 09/07/2016   PSA 2.12 08/19/2015   PSA 1.87 05/25/2013    DM Lab Results  Component Value Date   HGBA1C 7.8 (H) 04/23/2017   On metformin and glipizide and lantus   Today- UA is improved  Results for orders placed or performed in visit on 09/23/17  POCT Urinalysis Dipstick (Automated)  Result Value Ref Range   Color, UA Yellow    Clarity, UA Clear    Glucose, UA 1000 mg/dL    Bilirubin, UA Negative    Ketones, UA Negative    Spec Grav, UA 1.020 1.010 - 1.025   Blood, UA Negative    pH, UA 6.0 5.0 - 8.0   Protein, UA Negative    Urobilinogen, UA 0.2 0.2 or 1.0 E.U./dL   Nitrite, UA Negative    Leukocytes, UA Negative Negative    No symptoms at all now  By the next day - nl with no discomfort or other symptoms  Patient Active Problem List   Diagnosis Date Noted  . Gross hematuria 09/23/2017  . Scoliosis   . Obstructive sleep apnea on CPAP   . Hx of skin cancer, basal cell   . HLD (hyperlipidemia)   . GERD (gastroesophageal reflux disease)   . Chronic upper back pain   . Arthritis   . Allergy   . AC (acromioclavicular) joint bone spurs   . NSTEMI (non-ST  elevated myocardial infarction) ([place]) 06/18/2017  . Chest pain 06/17/2017  . Actinic keratoses 04/29/2017  . Positive colorectal cancer screening using Cologuard test 10/31/2016  . BPH (benign prostatic hyperplasia) 09/04/2016  . B12 deficiency 11/22/2015  . Routine general medical examination at a health care facility 08/26/2015  . Abnormal nuclear cardiac imaging test   . Dyspnea on exertion 11/27/2013  . SCC (squamous cell carcinoma), face 11/16/2013  . Encounter for examination of normal volunteer in research study 05/29/2013  . Prostate cancer screening 07/15/2012  . Abnormal chest x-ray 05/24/2011  . CAD S/P percutaneous coronary angioplasty 05/17/2011  . Nonspecific abnormal results of cardiovascular function study 04/30/2011  . Upper back pain, chronic 04/06/2011  . Abnormal EKG 04/06/2011  . Left arm pain 04/06/2011  . BACK PAIN 01/13/2010  . Insulin dependent diabetes mellitus ([place]) 10/04/2006  . Obesity 10/04/2006  . ERECTILE DYSFUNCTION 10/04/2006  . Essential hypertension 10/04/2006  . ALLERGIC RHINITIS 10/04/2006  . GERD 10/04/2006  . OSTEOARTHRITIS 10/04/2006  . Sleep apnea 10/04/2006  . EDEMA 10/04/2006  . SKIN CANCER, HX OF 10/04/2006   Past Medical History:  Diagnosis Date  . AC (acromioclavicular) joint bone spurs    lt ankle  . Allergy    allergic rhinitis  . Arthritis    OA  . BPH (benign prostatic hyperplasia)    microwave tx of prostate  . CAD (coronary artery disease)    a. s/p cath in 2012 showing 70-75% LAD stenosis, 75% D1 stenosis, 75% OM1, 60-70% RCA stenosis, and 80% PDA --> medical therapy pursued b. similar findings by cath in 2016; 06/2017 DES to distal RCA  . Chronic upper back pain   . Diabetes mellitus    type II x8 years  . GERD (gastroesophageal reflux disease)   . HLD (hyperlipidemia)    10/12: TC 208, TG 213, HDL 36, LDL 129  . Hx of skin cancer, basal cell    many skin  cancers removed/under constant treatment.  . Hypertension    . NSTEMI (non-ST elevated myocardial infarction) ([place]) 06/18/2017   DES to distal RCA  . Obstructive sleep apnea on CPAP   . Scoliosis    Past Surgical History:  Procedure Laterality Date  . BREAST SURGERY  2009   breast lump removed benign  . CARDIAC CATHETERIZATION N/A 03/15/2015   Procedure: Left Heart Cath and Coronary Angiography;  Surgeon: Belva Crome, MD;  Location: [facility];  Service: Cardiovascular;  Laterality: N/A;  . CORONARY STENT INTERVENTION N/A 06/18/2017   Procedure: CORONARY STENT INTERVENTION;  Surgeon: Jettie Booze, MD;  Location: [facility];  Service: Cardiovascular;  Laterality: N/A;  RCA  . LEFT HEART CATH AND CORONARY ANGIOGRAPHY N/A 06/18/2017   Procedure: LEFT HEART CATH AND CORONARY ANGIOGRAPHY;  Surgeon: Jettie Booze, MD;  Location: [facility];  Service: Cardiovascular;  Laterality: N/A;  . ULTRASOUND GUIDANCE FOR VASCULAR ACCESS  06/18/2017   Procedure: Ultrasound Guidance For Vascular Access;  Surgeon: Jettie Booze, MD;  Location: [facility];  Service: Cardiovascular;;  right radial, right femoral   Social History   Tobacco Use  . Smoking status: Never Smoker  . Smokeless tobacco: Never Used  Substance Use Topics  . Alcohol use: No    Alcohol/week: 0.0 oz  . Drug use: No   Family History  Problem Relation Age of Onset  . Heart attack Brother   . Colon cancer Neg Hx    Allergies  Allergen Reactions  . Loratadine     REACTION: not effective  . Simvastatin     REACTION: intolerant   Current Outpatient Medications on File Prior to Visit  Medication Sig Dispense Refill  . aspirin 81 MG tablet Take 81 mg by mouth daily.      . Blood Glucose Monitoring Suppl (TRUE METRIX AIR GLUCOSE METER) W/DEVICE KIT 1 kit by Other route 2 (two) times daily. Check blood sugar twice daily and as directed. Dx E11.65 1 kit 0  . cetirizine (ZYRTEC) 10 MG tablet TAKE 1 TABLET EVERY DAY 90 tablet 1  . Coenzyme  Q10 (CO Q 10 PO) Take 1 tablet by mouth daily.     . Cyanocobalamin (VITAMIN B12 PO) Take 1 tablet by mouth 2 (two) times a week.     . fluorouracil (EFUDEX) 5 % cream Apply 2 application topically daily as needed (skin lesions).     Marland Kitchen glipiZIDE (GLUCOTROL) 10 MG tablet Take 1 tablet (10 mg total) by mouth 2 (two) times daily with a meal. 180 tablet 1  . glucose blood (TRUE METRIX BLOOD GLUCOSE TEST) test strip CHECK BLOOD SUGAR TWICE DAILY AND AS NEEDED FOR DM (dx. E11.65) 300 each 1  . hydrochlorothiazide (HYDRODIURIL) 12.5 MG tablet Take 1 tablet (12.5 mg total) daily by mouth. 90 tablet 2  . Insulin Glargine (LANTUS SOLOSTAR) 100 UNIT/ML Solostar Pen INJECT  45 UNITS SUBCUTANEOUSLY AT BEDTIME 40 mL 3  . Insulin Pen Needle (UNIFINE PENTIPS) 31G X 6 MM MISC USE PEN NEEDLES WHEN TAKING LANTUS AT BEDTIME 100 each 1  . isosorbide mononitrate (IMDUR) 30 MG 24 hr tablet Take 1 tablet (30 mg total) daily by mouth. 90 tablet 1  . lisinopril (PRINIVIL,ZESTRIL) 10 MG tablet Take 1 tablet (10 mg total) daily by mouth. 90 tablet 2  . metFORMIN (GLUCOPHAGE) 1000 MG tablet Take 1 tablet (1,000 mg total) by mouth 2 (two) times daily. Resume on 06/22/17. 180 tablet  1  . metoprolol tartrate (LOPRESSOR) 25 MG tablet Take 0.5 tablets (12.5 mg total) by mouth 2 (two) times daily. 30 tablet 3  . nitroGLYCERIN (NITROSTAT) 0.4 MG SL tablet Place 1 tablet (0.4 mg total) every 5 (five) minutes as needed under the tongue for chest pain. 25 tablet 3  . pravastatin (PRAVACHOL) 80 MG tablet Take 1 tablet (80 mg total) by mouth every evening. 90 tablet 3  . ticagrelor (BRILINTA) 90 MG TABS tablet Take 1 tablet (90 mg total) by mouth 2 (two) times daily. 180 tablet 3  . triamcinolone (NASACORT) 55 MCG/ACT AERO nasal inhaler USE ONE SPRAYS EACH NOSTRIL EVERY DAY as needed    . TRUEPLUS LANCETS 30G MISC Check blood sugar twice daily and as directed. Dx E11.65 200 each 1   No current facility-administered medications on file  prior to visit.     Review of Systems  Constitutional: Negative for activity change, appetite change, fatigue, fever and unexpected weight change.  HENT: Negative for congestion, rhinorrhea, sore throat and trouble swallowing.   Eyes: Negative for pain, redness, itching and visual disturbance.  Respiratory: Negative for cough, chest tightness, shortness of breath and wheezing.   Cardiovascular: Negative for chest pain and palpitations.  Gastrointestinal: Negative for abdominal pain, blood in stool, constipation, diarrhea and nausea.  Endocrine: Negative for cold intolerance, heat intolerance, polydipsia and polyuria.  Genitourinary: Positive for hematuria. Negative for decreased urine volume, difficulty urinating, discharge, dysuria, enuresis, flank pain, frequency, genital sores, penile pain, penile swelling, scrotal swelling, testicular pain and urgency.       Hematuria is now resolved  Dysuria is now resolved   Musculoskeletal: Negative for arthralgias, joint swelling and myalgias.  Skin: Negative for pallor and rash.  Neurological: Negative for dizziness, tremors, weakness, numbness and headaches.  Hematological: Negative for adenopathy. Does not bruise/bleed easily.  Psychiatric/Behavioral: Negative for decreased concentration and dysphoric mood. The patient is not nervous/anxious.        Objective:   Physical Exam  Constitutional: He appears well-developed and well-nourished. No distress.  obese and well appearing   HENT:  Head: Normocephalic and atraumatic.  Eyes: Pupils are equal, round, and reactive to light. Conjunctivae and EOM are normal.  Neck: Normal range of motion. Neck supple.  Cardiovascular: Normal rate, regular rhythm and normal heart sounds.  Pulmonary/Chest: Effort normal and breath sounds normal.  Abdominal: Soft. Bowel sounds are normal. He exhibits no distension. There is no tenderness. There is no rebound.  No cva tenderness  no suprapubic tenderness    Musculoskeletal: He exhibits no edema.  Lymphadenopathy:    He has no cervical adenopathy.  Neurological: He is alert.  Skin: No rash noted. No pallor.  Psychiatric: He has a normal mood and affect.          Assessment & Plan:   Problem List Items Addressed This Visit      Cardiovascular and Mediastinum   Essential hypertension    bp in fair control at this time  BP Readings from Last 1 Encounters:  09/23/17 136/68   No changes needed Disc lifstyle change with low sodium diet and exercise          Genitourinary   Gross hematuria - Primary    Episode of gross hematuria in pt on Brillinta for anticoagulation  Now resolved  Rev ED lab/ua - (no notes as he left w/o being seen) He declines ref to urology at this time  UA is clear now  Unsure of cause -  disc need for cystoscopy  He chooses to wait on this and call back if symptoms re occur         Other Visit Diagnoses    Hematuria, unspecified type       Relevant Orders   POCT Urinalysis Dipstick (Automated) (Completed)

## 2017-09-23 NOTE — Telephone Encounter (Signed)
Per ED note pt left without being seen; pt got to ED at 10:30 PM and was still not seen at 4:00 AM so left to go home. Has not seen any more blood over weekend. Pt stopped bleeding and is not hurting when urinates now but Mrs Eichhorst thinks should be seen since on blood thinner; pt has appt with Dr Glori Bickers 09/23/17 at 10 AM.

## 2017-09-23 NOTE — Telephone Encounter (Signed)
I will see him then

## 2017-09-23 NOTE — Assessment & Plan Note (Signed)
bp in fair control at this time  BP Readings from Last 1 Encounters:  09/23/17 136/68   No changes needed Disc lifstyle change with low sodium diet and exercise

## 2017-09-24 ENCOUNTER — Ambulatory Visit (INDEPENDENT_AMBULATORY_CARE_PROVIDER_SITE_OTHER): Payer: Medicare HMO | Admitting: Cardiology

## 2017-09-24 ENCOUNTER — Telehealth: Payer: Self-pay | Admitting: Family Medicine

## 2017-09-24 ENCOUNTER — Encounter: Payer: Medicare HMO | Admitting: *Deleted

## 2017-09-24 ENCOUNTER — Encounter: Payer: Self-pay | Admitting: Cardiology

## 2017-09-24 VITALS — BP 144/66 | HR 70 | Ht 72.0 in | Wt 258.4 lb

## 2017-09-24 VITALS — BP 138/60 | HR 62 | Resp 14 | Wt 256.4 lb

## 2017-09-24 DIAGNOSIS — I2510 Atherosclerotic heart disease of native coronary artery without angina pectoris: Secondary | ICD-10-CM

## 2017-09-24 DIAGNOSIS — I10 Essential (primary) hypertension: Secondary | ICD-10-CM

## 2017-09-24 DIAGNOSIS — R002 Palpitations: Secondary | ICD-10-CM | POA: Diagnosis not present

## 2017-09-24 DIAGNOSIS — E7800 Pure hypercholesterolemia, unspecified: Secondary | ICD-10-CM | POA: Diagnosis not present

## 2017-09-24 DIAGNOSIS — Z006 Encounter for examination for normal comparison and control in clinical research program: Secondary | ICD-10-CM

## 2017-09-24 MED ORDER — METFORMIN HCL 1000 MG PO TABS: 1000.0000 mg | ORAL_TABLET | Freq: Two times a day (BID) | ORAL | 0 refills | Status: DC

## 2017-09-24 MED ORDER — LISINOPRIL 20 MG PO TABS: 20.0000 mg | ORAL_TABLET | Freq: Every day | ORAL | 3 refills | Status: DC

## 2017-09-24 NOTE — Telephone Encounter (Signed)
Patient was called and wife answered the phone. She verified the patient needs 1 week worth of pills until the mail order arrives.

## 2017-09-24 NOTE — Telephone Encounter (Signed)
Copied from [place] [id number]. Topic: Quick Communication - Rx Refill/Question >> Sep 24, 2017  1:37 PM Ahmed Prima L wrote: Medication: metFORMIN (GLUCOPHAGE) 1000 MG tablet Has the patient contacted their pharmacy? Yes  (Agent: If no, request that the patient contact the pharmacy for the refill.) Preferred Pharmacy (with phone number or street name): [id number] - [place], [place] Agent: Please be advised that RX refills may take up to 3 business days. We ask that you follow-up with your pharmacy.  [facility] pharmacy called and wants to know if the pt can get an emergency fill on this to the local pharmacy

## 2017-09-24 NOTE — Progress Notes (Signed)
Visit 8  Pt here for follow up. No chest pain or shortness of breath.  No changes in medications.  Patient had an episode of hematuria 2 days ago.  He was seen by primary care and urology evaluation recommended.    Current Outpatient Medications:  .  aspirin 81 MG tablet, Take 81 mg by mouth daily.  , Disp: , Rfl:  .  Blood Glucose Monitoring Suppl (TRUE METRIX AIR GLUCOSE METER) W/DEVICE KIT, 1 kit by Other route 2 (two) times daily. Check blood sugar twice daily and as directed. Dx E11.65, Disp: 1 kit, Rfl: 0 .  cetirizine (ZYRTEC) 10 MG tablet, TAKE 1 TABLET EVERY DAY, Disp: 90 tablet, Rfl: 1 .  Coenzyme Q10 (CO Q 10 PO), Take 1 tablet by mouth daily. , Disp: , Rfl:  .  Cyanocobalamin (VITAMIN B12 PO), Take 1 tablet by mouth 2 (two) times a week. , Disp: , Rfl:  .  glipiZIDE (GLUCOTROL) 10 MG tablet, Take 1 tablet (10 mg total) by mouth 2 (two) times daily with a meal., Disp: 180 tablet, Rfl: 1 .  glucose blood (TRUE METRIX BLOOD GLUCOSE TEST) test strip, CHECK BLOOD SUGAR TWICE DAILY AND AS NEEDED FOR DM (dx. E11.65), Disp: 300 each, Rfl: 1 .  hydrochlorothiazide (HYDRODIURIL) 12.5 MG tablet, Take 1 tablet (12.5 mg total) daily by mouth., Disp: 90 tablet, Rfl: 2 .  Insulin Glargine (LANTUS SOLOSTAR) 100 UNIT/ML Solostar Pen, INJECT  45 UNITS SUBCUTANEOUSLY AT BEDTIME, Disp: 40 mL, Rfl: 3 .  Insulin Pen Needle (UNIFINE PENTIPS) 31G X 6 MM MISC, USE PEN NEEDLES WHEN TAKING LANTUS AT BEDTIME, Disp: 100 each, Rfl: 1 .  isosorbide mononitrate (IMDUR) 30 MG 24 hr tablet, Take 1 tablet (30 mg total) daily by mouth., Disp: 90 tablet, Rfl: 1 .  lisinopril (PRINIVIL,ZESTRIL) 20 MG tablet, Take 1 tablet (20 mg total) by mouth daily., Disp: 90 tablet, Rfl: 3 .  metFORMIN (GLUCOPHAGE) 1000 MG tablet, Take 1 tablet (1,000 mg total) by mouth 2 (two) times daily. Resume on 06/22/17., Disp: 180 tablet, Rfl: 1 .  metoprolol tartrate (LOPRESSOR) 25 MG tablet, Take 0.5 tablets (12.5 mg total) by mouth 2 (two)  times daily., Disp: 30 tablet, Rfl: 3 .  nitroGLYCERIN (NITROSTAT) 0.4 MG SL tablet, Place 1 tablet (0.4 mg total) every 5 (five) minutes as needed under the tongue for chest pain., Disp: 25 tablet, Rfl: 3 .  pravastatin (PRAVACHOL) 80 MG tablet, Take 1 tablet (80 mg total) by mouth every evening., Disp: 90 tablet, Rfl: 3 .  ticagrelor (BRILINTA) 90 MG TABS tablet, Take 1 tablet (90 mg total) by mouth 2 (two) times daily., Disp: 180 tablet, Rfl: 3 .  triamcinolone (NASACORT) 55 MCG/ACT AERO nasal inhaler, USE ONE SPRAYS EACH NOSTRIL EVERY DAY as needed, Disp: , Rfl:  .  TRUEPLUS LANCETS 30G MISC, Check blood sugar twice daily and as directed. Dx E11.65, Disp: 200 each, Rfl: 1 .  fluorouracil (EFUDEX) 5 % cream, Apply 2 application topically daily as needed (skin lesions). , Disp: , Rfl:                                      "CONSENT"   YES     NO   Continuing further Investigational Product and study visits for follow-up?      X     Continuing consent from future biomedical research  X                                      "EVENTS"    YES     NO  AE   (IF YES SEE SOURCE)       X  SAE  (IF YES SEE SOURCE)       X  ENDPOINT   (IF YES SEE SOURCE)       X  REVASCULARIZATION  (IF YES SEE SOURCE)       X  AMPUTATION   (IF YES SEE SOURCE)       X  TROPONIN'S  (IF YES SEE SOURCE)       X   Health Questionnaire  English version for the [place]    By placing a checkmark in one box in each group below, please indicate which statements best describe your own health state today.     Mobility   I have no problems in walking about X  I have some problems in walking about ?  I am confined to bed ?     Self-Care   I have no problems with self-care X  I have some problems washing or dressing myself ?  I am unable to wash or dress myself ?     Usual Activities (e.g. work, study, housework, family or leisure activities)   I have no problems with performing my usual activities X  I have some  problems with performing my usual activities ?  I am unable to perform my usual activities ?     Pain / Discomfort   I have no pain or discomfort X  I have moderate pain or discomfort ?  I have extreme pain or discomfort ?     Anxiety / Depression   I am not anxious or depressed ?  I am moderately anxious or depressed ?  I am extremely anxious or depressed ?   To help people say how good or bad a health state is, we have drawn a scale (rather like a thermometer) on which the best state you can imagine is marked 100 and the worst state you can imagine is marked 0.    We would like you to indicate on this scale how good or bad your own health is today, in your opinion. Please do this by drawing a line from the box below to whichever point on the scale indicates how good or bad your health state is today.   87  Lifestyle Adherence Assessment:    YES NO  Abstinence from smoking/remaining tobacco free X   Cardiac Diet X   Routine physical activity and/or cardiac rehabilitation X

## 2017-09-24 NOTE — Patient Instructions (Signed)
Medication Instructions:   INCREASE LISINOPRIL TO 20 MG ONCE DAILY= 2 OF THE 10 MG TABLETS ONCE DAILY  Labwork:  Your physician recommends that you return for lab work in: [place]:  Your physician wants you to follow-up in: [place] will receive a reminder letter in the mail two months in advance. If you don't receive a letter, please call our office to schedule the follow-up appointment.   If you need a refill on your cardiac medications before your next appointment, please call your pharmacy.

## 2017-10-07 DIAGNOSIS — I10 Essential (primary) hypertension: Secondary | ICD-10-CM | POA: Diagnosis not present

## 2017-10-08 ENCOUNTER — Encounter: Payer: Self-pay | Admitting: *Deleted

## 2017-10-08 LAB — BASIC METABOLIC PANEL
BUN/Creatinine Ratio: 14 (ref 10–24)
BUN: 18 mg/dL (ref 8–27)
CALCIUM: 9.2 mg/dL (ref 8.6–10.2)
CO2: 23 mmol/L (ref 20–29)
Chloride: 106 mmol/L (ref 96–106)
Creatinine, Ser: 1.25 mg/dL (ref 0.76–1.27)
GFR calc non Af Amer: 54 mL/min/{1.73_m2} — ABNORMAL LOW (ref >59)
GFR, EST AFRICAN AMERICAN: 62 mL/min/{1.73_m2} (ref >59)
Glucose: 185 mg/dL — ABNORMAL HIGH (ref 65–99)
Potassium: 4.8 mmol/L (ref 3.5–5.2)
Sodium: 144 mmol/L (ref 134–144)

## 2017-10-13 ENCOUNTER — Telehealth: Payer: Self-pay | Admitting: Family Medicine

## 2017-10-13 DIAGNOSIS — I10 Essential (primary) hypertension: Secondary | ICD-10-CM

## 2017-10-13 DIAGNOSIS — Z125 Encounter for screening for malignant neoplasm of prostate: Secondary | ICD-10-CM

## 2017-10-13 DIAGNOSIS — E782 Mixed hyperlipidemia: Secondary | ICD-10-CM

## 2017-10-13 DIAGNOSIS — E119 Type 2 diabetes mellitus without complications: Secondary | ICD-10-CM

## 2017-10-13 DIAGNOSIS — E538 Deficiency of other specified B group vitamins: Secondary | ICD-10-CM

## 2017-10-13 DIAGNOSIS — Z794 Long term (current) use of insulin: Secondary | ICD-10-CM

## 2017-10-13 DIAGNOSIS — IMO0001 Reserved for inherently not codable concepts without codable children: Secondary | ICD-10-CM

## 2017-10-13 NOTE — Telephone Encounter (Signed)
-----   Message from Eustace Pen, LPN sent at 01/03/8888 11:58 AM EDT ----- Regarding: Labs 4/16 Lab orders needed. Thank you.  Insurance:  [employer]

## 2017-10-15 ENCOUNTER — Ambulatory Visit: Payer: Medicare HMO

## 2017-10-15 ENCOUNTER — Other Ambulatory Visit: Payer: Self-pay

## 2017-10-15 ENCOUNTER — Ambulatory Visit (INDEPENDENT_AMBULATORY_CARE_PROVIDER_SITE_OTHER): Payer: Medicare HMO

## 2017-10-15 VITALS — BP 110/60 | HR 50 | Temp 97.6°F | Ht 71.5 in | Wt 254.5 lb

## 2017-10-15 DIAGNOSIS — I10 Essential (primary) hypertension: Secondary | ICD-10-CM | POA: Diagnosis not present

## 2017-10-15 DIAGNOSIS — Z0000 Encounter for general adult medical examination without abnormal findings: Secondary | ICD-10-CM | POA: Diagnosis not present

## 2017-10-15 DIAGNOSIS — E782 Mixed hyperlipidemia: Secondary | ICD-10-CM | POA: Diagnosis not present

## 2017-10-15 DIAGNOSIS — IMO0001 Reserved for inherently not codable concepts without codable children: Secondary | ICD-10-CM

## 2017-10-15 DIAGNOSIS — E119 Type 2 diabetes mellitus without complications: Secondary | ICD-10-CM

## 2017-10-15 DIAGNOSIS — Z794 Long term (current) use of insulin: Secondary | ICD-10-CM | POA: Diagnosis not present

## 2017-10-15 DIAGNOSIS — E538 Deficiency of other specified B group vitamins: Secondary | ICD-10-CM

## 2017-10-15 LAB — LIPID PANEL
CHOLESTEROL: 122 mg/dL (ref 0–200)
HDL: 34.2 mg/dL — ABNORMAL LOW (ref >39.00)
LDL Cholesterol: 69 mg/dL (ref 0–99)
NonHDL: 87.86
Total CHOL/HDL Ratio: 4
Triglycerides: 96 mg/dL (ref 0.0–149.0)
VLDL: 19.2 mg/dL (ref 0.0–40.0)

## 2017-10-15 LAB — COMPREHENSIVE METABOLIC PANEL
ALBUMIN: 3.9 g/dL (ref 3.5–5.2)
ALK PHOS: 77 U/L (ref 39–117)
ALT: 17 U/L (ref 0–53)
AST: 13 U/L (ref 0–37)
BILIRUBIN TOTAL: 0.5 mg/dL (ref 0.2–1.2)
BUN: 19 mg/dL (ref 6–23)
CO2: 24 mEq/L (ref 19–32)
Calcium: 9.2 mg/dL (ref 8.4–10.5)
Chloride: 108 mEq/L (ref 96–112)
Creatinine, Ser: 1.1 mg/dL (ref 0.40–1.50)
GFR: 68.17 mL/min (ref >60.00)
GLUCOSE: 118 mg/dL — AB (ref 70–99)
Potassium: 4.1 mEq/L (ref 3.5–5.1)
Sodium: 140 mEq/L (ref 135–145)
Total Protein: 6.7 g/dL (ref 6.0–8.3)

## 2017-10-15 LAB — CBC WITH DIFFERENTIAL/PLATELET
BASOS ABS: 0 10*3/uL (ref 0.0–0.1)
Basophils Relative: 0.6 % (ref 0.0–3.0)
Eosinophils Absolute: 0.2 10*3/uL (ref 0.0–0.7)
Eosinophils Relative: 3.1 % (ref 0.0–5.0)
HCT: 38.8 % — ABNORMAL LOW (ref 39.0–52.0)
Hemoglobin: 13.3 g/dL (ref 13.0–17.0)
LYMPHS ABS: 1.9 10*3/uL (ref 0.7–4.0)
Lymphocytes Relative: 28 % (ref 12.0–46.0)
MCHC: 34.3 g/dL (ref 30.0–36.0)
MCV: 91 fl (ref 78.0–100.0)
MONOS PCT: 11.8 % (ref 3.0–12.0)
Monocytes Absolute: 0.8 10*3/uL (ref 0.1–1.0)
NEUTROS PCT: 56.5 % (ref 43.0–77.0)
Neutro Abs: 3.8 10*3/uL (ref 1.4–7.7)
Platelets: 180 10*3/uL (ref 150.0–400.0)
RBC: 4.26 Mil/uL (ref 4.22–5.81)
RDW: 15.6 % — ABNORMAL HIGH (ref 11.5–15.5)
WBC: 6.7 10*3/uL (ref 4.0–10.5)

## 2017-10-15 LAB — HEMOGLOBIN A1C: Hgb A1c MFr Bld: 7.6 % — ABNORMAL HIGH (ref 4.6–6.5)

## 2017-10-15 LAB — VITAMIN B12: Vitamin B-12: 254 pg/mL (ref 211–911)

## 2017-10-15 LAB — TSH: TSH: 5.86 u[IU]/mL — AB (ref 0.35–4.50)

## 2017-10-15 NOTE — Telephone Encounter (Signed)
Patient in office today for AWV appt. Patient requested refill for Metoprolol Tartrate 25 mg tablets. Verbalized Dr. Stanford Breed is cardiologist. [facility] of choice is [facility] [phone], [place], [place]

## 2017-10-15 NOTE — Progress Notes (Signed)
Subjective:   Ryan Garcia is a 84 y.o. male who presents for Medicare Annual/Subsequent preventive examination.  Review of Systems:  N/A Cardiac Risk Factors include: advanced age (>57mn, >>71women);male gender;diabetes mellitus;obesity (BMI >30kg/m2);dyslipidemia;hypertension     Objective:    Vitals: BP 110/60 (BP Location: Right Wrist, Patient Position: Sitting, Cuff Size: Normal)   Pulse (!) 50   Temp 97.6 F (36.4 C) (Oral)   Ht 5' 11.5" (1.816 m) Comment: shoes  Wt 254 lb 8 oz (115.4 kg)   SpO2 98%   BMI 35.00 kg/m   Body mass index is 35 kg/m.  Advanced Directives 10/15/2017 06/18/2017 06/17/2017 09/07/2016 10/05/2015 03/15/2015 05/08/2011  Does Patient Have a Medical Advance Directive? No No Yes;No No No No Patient does not have advance directive  Would patient like information on creating a medical advance directive? Yes (MAU/Ambulatory/Procedural Areas - Information given) No - Patient declined - - Yes - Educational materials given;No - patient declined information No - patient declined information -  Pre-existing out of facility DNR order (yellow form or pink MOST form) - - - - - - Other (comment)    Tobacco Social History   Tobacco Use  Smoking Status Never Smoker  Smokeless Tobacco Never Used     Counseling given: No   Clinical Intake:  Pre-visit preparation completed: Yes  Pain Score: 0-No pain     Nutritional Status: BMI > 30  Obese Nutritional Risks: None Diabetes: Yes CBG done?: No Did pt. bring in CBG monitor from home?: No  How often do you need to have someone help you when you read instructions, pamphlets, or other written materials from your doctor or pharmacy?: 1 - Never What is the last grade level you completed in school?: 12th grade  Interpreter Needed?: No  Comments: pt lives with spouse Information entered by :: LPinson, LPN  Past Medical History:  Diagnosis Date  . AC (acromioclavicular) joint bone spurs    lt ankle  .  Allergy    allergic rhinitis  . Arthritis    OA  . BPH (benign prostatic hyperplasia)    microwave tx of prostate  . CAD (coronary artery disease)    a. s/p cath in 2012 showing 70-75% LAD stenosis, 75% D1 stenosis, 75% OM1, 60-70% RCA stenosis, and 80% PDA --> medical therapy pursued b. similar findings by cath in 2016; 06/2017 DES to distal RCA  . Chronic upper back pain   . Diabetes mellitus    type II x8 years  . GERD (gastroesophageal reflux disease)   . HLD (hyperlipidemia)    10/12: TC 208, TG 213, HDL 36, LDL 129  . Hx of skin cancer, basal cell    many skin cancers removed/under constant treatment.  . Hypertension   . NSTEMI (non-ST elevated myocardial infarction) ([facility] 06/18/2017   DES to distal RCA  . Obstructive sleep apnea on CPAP   . Scoliosis    Past Surgical History:  Procedure Laterality Date  . BREAST SURGERY  2009   breast lump removed benign  . CARDIAC CATHETERIZATION N/A 03/15/2015   Procedure: Left Heart Cath and Coronary Angiography;  Surgeon: HBelva Crome MD;  [facility];  Service: Cardiovascular;  Laterality: N/A;  . CORONARY STENT INTERVENTION N/A 06/18/2017   Procedure: CORONARY STENT INTERVENTION;  Surgeon: VJettie Booze MD;  [facility];  Service: Cardiovascular;  Laterality: N/A;  RCA  . LEFT HEART CATH AND CORONARY ANGIOGRAPHY N/A 06/18/2017  Procedure: LEFT HEART CATH AND CORONARY ANGIOGRAPHY;  Surgeon: Jettie Booze, MD;  Location: [facility];  Service: Cardiovascular;  Laterality: N/A;  . ULTRASOUND GUIDANCE FOR VASCULAR ACCESS  06/18/2017   Procedure: Ultrasound Guidance For Vascular Access;  Surgeon: Jettie Booze, MD;  Location: [facility];  Service: Cardiovascular;;  right radial, right femoral   Family History  Problem Relation Age of Onset  . Heart attack Brother   . Colon cancer Neg Hx    Social History   Socioeconomic History  . Marital status: Married     Spouse name: Not on file  . Number of children: Not on file  . Years of education: Not on file  . Highest education level: Not on file  Occupational History  . Not on file  Social Needs  . Financial resource strain: Not on file  . Food insecurity:    Worry: Not on file    Inability: Not on file  . Transportation needs:    Medical: Not on file    Non-medical: Not on file  Tobacco Use  . Smoking status: Never Smoker  . Smokeless tobacco: Never Used  Substance and Sexual Activity  . Alcohol use: No    Alcohol/week: 0.0 oz  . Drug use: No  . Sexual activity: Not Currently  Lifestyle  . Physical activity:    Days per week: Not on file    Minutes per session: Not on file  . Stress: Not on file  Relationships  . Social connections:    Talks on phone: Not on file    Gets together: Not on file    Attends religious service: Not on file    Active member of club or organization: Not on file    Attends meetings of clubs or organizations: Not on file    Relationship status: Not on file  Other Topics Concern  . Not on file  Social History Narrative   Married    Physically active hard of hearing    Outpatient Encounter Medications as of 10/15/2017  Medication Sig  . aspirin 81 MG tablet Take 81 mg by mouth daily.    . Blood Glucose Monitoring Suppl (TRUE METRIX AIR GLUCOSE METER) W/DEVICE KIT 1 kit by Other route 2 (two) times daily. Check blood sugar twice daily and as directed. Dx E11.65  . cetirizine (ZYRTEC) 10 MG tablet TAKE 1 TABLET EVERY DAY  . Coenzyme Q10 (CO Q 10 PO) Take 1 tablet by mouth daily.   . Cyanocobalamin (VITAMIN B12 PO) Take 1 tablet by mouth 2 (two) times a week.   . fluorouracil (EFUDEX) 5 % cream Apply 2 application topically daily as needed (skin lesions).   Marland Kitchen glipiZIDE (GLUCOTROL) 10 MG tablet Take 1 tablet (10 mg total) by mouth 2 (two) times daily with a meal.  . glucose blood (TRUE METRIX BLOOD GLUCOSE TEST) test strip CHECK BLOOD SUGAR TWICE DAILY AND  AS NEEDED FOR DM (dx. E11.65)  . hydrochlorothiazide (HYDRODIURIL) 12.5 MG tablet Take 1 tablet (12.5 mg total) daily by mouth.  . Insulin Glargine (LANTUS SOLOSTAR) 100 UNIT/ML Solostar Pen INJECT  45 UNITS SUBCUTANEOUSLY AT BEDTIME  . Insulin Pen Needle (UNIFINE PENTIPS) 31G X 6 MM MISC USE PEN NEEDLES WHEN TAKING LANTUS AT BEDTIME  . isosorbide mononitrate (IMDUR) 30 MG 24 hr tablet Take 1 tablet (30 mg total) daily by mouth.  Marland Kitchen lisinopril (PRINIVIL,ZESTRIL) 20 MG tablet Take 1 tablet (20 mg total) by mouth daily.  Marland Kitchen  metFORMIN (GLUCOPHAGE) 1000 MG tablet Take 1 tablet (1,000 mg total) by mouth 2 (two) times daily.  . metoprolol tartrate (LOPRESSOR) 25 MG tablet Take 0.5 tablets (12.5 mg total) by mouth 2 (two) times daily.  . nitroGLYCERIN (NITROSTAT) 0.4 MG SL tablet Place 1 tablet (0.4 mg total) every 5 (five) minutes as needed under the tongue for chest pain.  . pravastatin (PRAVACHOL) 80 MG tablet Take 1 tablet (80 mg total) by mouth every evening.  . ticagrelor (BRILINTA) 90 MG TABS tablet Take 1 tablet (90 mg total) by mouth 2 (two) times daily.  Marland Kitchen triamcinolone (NASACORT) 55 MCG/ACT AERO nasal inhaler USE ONE SPRAYS EACH NOSTRIL EVERY DAY as needed  . TRUEPLUS LANCETS 30G MISC Check blood sugar twice daily and as directed. Dx E11.65   No facility-administered encounter medications on file as of 10/15/2017.     Activities of Daily Living In your present state of health, do you have any difficulty performing the following activities: 10/15/2017 06/18/2017  Hearing? [place]? N -  Difficulty concentrating or making decisions? Y -  Walking or climbing stairs? N -  Dressing or bathing? N -  Doing errands, shopping? N N  Preparing Food and eating ? N -  Using the Toilet? N -  In the past six months, have you accidently leaked urine? Y -  Do you have problems with loss of bowel control? N -  Managing your Medications? N -  Managing your Finances? N -  Housekeeping or managing your  Housekeeping? N -  Some recent data might be hidden    Patient Care Team: Tower, Wynelle Fanny, MD as PCP - General Leandrew Koyanagi, MD as Referring Physician (Ophthalmology) Lelon Perla, MD as Consulting Physician (Cardiology) Dasher, Rayvon Char, MD as Consulting Physician (Dermatology) Salomon Fick., MD as Referring Physician (Dentistry)   Assessment:   This is a routine wellness examination for [place].  Hearing Screening Comments: Bilateral hearing aids Vision Screening Comments: Dec 2018 with Dr. Wallace Going    Exercise Activities and Dietary recommendations Current Exercise Habits: The patient has a physically strenous job, but has no regular exercise apart from work., Exercise limited by: None identified  Goals    . DIET - INCREASE WATER INTAKE     Starting 10/15/2017, I will attempt to drink at least 6-8 glasses of water daily.        Fall Risk Fall Risk  10/15/2017 09/07/2016 08/26/2015 07/23/2012  Falls in the past year? No No No No   Depression Screen PHQ 2/9 Scores 10/15/2017 09/07/2016 08/26/2015 07/23/2012  PHQ - 2 Score 0 0 0 0  PHQ- 9 Score 0 - - -    Cognitive Function MMSE - Mini Mental State Exam 10/15/2017 09/07/2016  Orientation to time 5 5  Orientation to Place 5 5  Registration 3 3  Attention/ Calculation 0 0  Recall 3 3  Language- name 2 objects 0 0  Language- repeat 1 1  Language- follow 3 step command 3 3  Language- read & follow direction 0 0  Write a sentence 0 0  Copy design 0 0  Total score 20 20     PLEASE NOTE: A Mini-Cog screen was completed. Maximum score is 20. A value of 0 denotes this part of [place] MMSE was not completed or the patient failed this part of the Mini-Cog screening.   Mini-Cog Screening Orientation to Time - Max 5 pts Orientation to Place - Max 5 pts Registration - Max  3 pts Recall - Max 3 pts Language Repeat - Max 1 pts Language Follow 3 Step Command - Max 3 pts    Immunization History  Administered Date(s)  Administered  . Influenza Split 07/09/2011  . Influenza Whole 06/01/2002, 05/22/2007  . Influenza, Seasonal, Injecte, Preservative Fre 07/23/2012  . Influenza,inj,Quad PF,6+ Mos 05/29/2013, 03/01/2014, 08/26/2015, 05/29/2016, 04/29/2017  . Pneumococcal Conjugate-13 10/04/2014  . Pneumococcal Polysaccharide-23 06/08/2002, 05/22/2007  . Td 01/22/2001  . Tdap 07/09/2011  . Zoster 11/21/2012    Screening Tests Health Maintenance  Topic Date Due  . INFLUENZA VACCINE  01/30/2018  . HEMOGLOBIN A1C  04/16/2018  . FOOT EXAM  04/29/2018  . OPHTHALMOLOGY EXAM  07/16/2018  . TETANUS/TDAP  07/08/2021  . PNA vac Low Risk Adult  Completed      Plan:     I have personally reviewed, addressed, and noted the following in the patient's chart:  A. Medical and social history B. Use of alcohol, tobacco or illicit drugs  C. Current medications and supplements D. Functional ability and status E.  Nutritional status F.  Physical activity G. Advance directives H. List of other physicians I.  Hospitalizations, surgeries, and ER visits in previous 12 months J.  [place] to include hearing, vision, cognitive, depression L. Referrals and appointments - none  In addition, I have reviewed and discussed with patient certain preventive protocols, quality metrics, and best practice recommendations. A written personalized care plan for preventive services as well as general preventive health recommendations were provided to patient.  See attached scanned questionnaire for additional information.   Signed,   Lindell Noe, MHA, BS, LPN Health Coach

## 2017-10-15 NOTE — Patient Instructions (Signed)
Ryan Garcia , Thank you for taking time to come for your Medicare Wellness Visit. I appreciate your ongoing commitment to your health goals. Please review the following plan we discussed and let me know if I can assist you in the future.   These are the goals we discussed: Goals    . DIET - INCREASE WATER INTAKE     Starting 10/15/2017, I will attempt to drink at least 6-8 glasses of water daily.        This is a list of the screening recommended for you and due dates:  Health Maintenance  Topic Date Due  . Flu Shot  01/30/2018  . Hemoglobin A1C  04/16/2018  . Complete foot exam   04/29/2018  . Eye exam for diabetics  07/16/2018  . Tetanus Vaccine  07/08/2021  . Pneumonia vaccines  Completed   Preventive Care for Adults  A healthy lifestyle and preventive care can promote health and wellness. Preventive health guidelines for adults include the following key practices.  . A routine yearly physical is a good way to check with your health care provider about your health and preventive screening. It is a chance to share any concerns and updates on your health and to receive a thorough exam.  . Visit your dentist for a routine exam and preventive care every 6 months. Brush your teeth twice a day and floss once a day. Good oral hygiene prevents tooth decay and gum disease.  . The frequency of eye exams is based on your age, health, family medical history, use  of contact lenses, and other factors. Follow your health care provider's recommendations for frequency of eye exams.  . Eat a healthy diet. Foods like vegetables, fruits, whole grains, low-fat dairy products, and lean protein foods contain the nutrients you need without too many calories. Decrease your intake of foods high in solid fats, added sugars, and salt. Eat the right amount of calories for you. Get information about a proper diet from your health care provider, if necessary.  . Regular physical exercise is one of the most  important things you can do for your health. Most adults should get at least 150 minutes of moderate-intensity exercise (any activity that increases your heart rate and causes you to sweat) each week. In addition, most adults need muscle-strengthening exercises on 2 or more days a week.  Silver Sneakers may be a benefit available to you. To determine eligibility, you may visit the website: www.silversneakers.com or contact program at 563-395-2636 Mon-Fri between 8AM-8PM.   . Maintain a healthy weight. The body mass index (BMI) is a screening tool to identify possible weight problems. It provides an estimate of body fat based on height and weight. Your health care provider can find your BMI and can help you achieve or maintain a healthy weight.   For adults 20 years and older: ? A BMI below 18.5 is considered underweight. ? A BMI of 18.5 to 24.9 is normal. ? A BMI of 25 to 29.9 is considered overweight. ? A BMI of 30 and above is considered obese.   . Maintain normal blood lipids and cholesterol levels by exercising and minimizing your intake of saturated fat. Eat a balanced diet with plenty of fruit and vegetables. Blood tests for lipids and cholesterol should begin at age 80 and be repeated every 5 years. If your lipid or cholesterol levels are high, you are over 50, or you are at high risk for heart disease, you may need your  cholesterol levels checked more frequently. Ongoing high lipid and cholesterol levels should be treated with medicines if diet and exercise are not working.  . If you smoke, find out from your health care provider how to quit. If you do not use tobacco, please do not start.  . If you choose to drink alcohol, please do not consume more than 2 drinks per day. One drink is considered to be 12 ounces (355 mL) of beer, 5 ounces (148 mL) of wine, or 1.5 ounces (44 mL) of liquor.  . If you are 63-5 years old, ask your health care provider if you should take aspirin to prevent  strokes.  . Use sunscreen. Apply sunscreen liberally and repeatedly throughout the day. You should seek shade when your shadow is shorter than you. Protect yourself by wearing long sleeves, pants, a wide-brimmed hat, and sunglasses year round, whenever you are outdoors.  . Once a month, do a whole body skin exam, using a mirror to look at the skin on your back. Tell your health care provider of new moles, moles that have irregular borders, moles that are larger than a pencil eraser, or moles that have changed in shape or color.

## 2017-10-15 NOTE — Progress Notes (Signed)
PCP notes:   Health maintenance:  A1C - completed  Abnormal screenings:   None  Patient concerns:   None  Nurse concerns:  None  Next PCP appt:   10/28/17 @ 0830  I reviewed health advisor's note, was available for consultation, and agree with documentation and plan. Loura Pardon MD

## 2017-10-16 MED ORDER — METOPROLOL TARTRATE 25 MG PO TABS: 12.5000 mg | ORAL_TABLET | Freq: Two times a day (BID) | ORAL | 3 refills | Status: DC

## 2017-10-17 ENCOUNTER — Ambulatory Visit: Payer: Medicare HMO

## 2017-10-21 DIAGNOSIS — G4733 Obstructive sleep apnea (adult) (pediatric): Secondary | ICD-10-CM | POA: Diagnosis not present

## 2017-10-21 DIAGNOSIS — R0689 Other abnormalities of breathing: Secondary | ICD-10-CM | POA: Diagnosis not present

## 2017-10-24 DIAGNOSIS — D0439 Carcinoma in situ of skin of other parts of face: Secondary | ICD-10-CM | POA: Diagnosis not present

## 2017-10-28 ENCOUNTER — Ambulatory Visit (INDEPENDENT_AMBULATORY_CARE_PROVIDER_SITE_OTHER): Payer: Medicare HMO | Admitting: Family Medicine

## 2017-10-28 ENCOUNTER — Encounter: Payer: Self-pay | Admitting: Family Medicine

## 2017-10-28 VITALS — BP 130/65 | HR 63 | Temp 98.4°F | Ht 71.5 in | Wt 254.0 lb

## 2017-10-28 DIAGNOSIS — E039 Hypothyroidism, unspecified: Secondary | ICD-10-CM

## 2017-10-28 DIAGNOSIS — E038 Other specified hypothyroidism: Secondary | ICD-10-CM | POA: Insufficient documentation

## 2017-10-28 DIAGNOSIS — E1165 Type 2 diabetes mellitus with hyperglycemia: Secondary | ICD-10-CM | POA: Insufficient documentation

## 2017-10-28 DIAGNOSIS — I10 Essential (primary) hypertension: Secondary | ICD-10-CM

## 2017-10-28 DIAGNOSIS — Z0000 Encounter for general adult medical examination without abnormal findings: Secondary | ICD-10-CM | POA: Diagnosis not present

## 2017-10-28 DIAGNOSIS — Z85828 Personal history of other malignant neoplasm of skin: Secondary | ICD-10-CM

## 2017-10-28 DIAGNOSIS — N400 Enlarged prostate without lower urinary tract symptoms: Secondary | ICD-10-CM

## 2017-10-28 DIAGNOSIS — E538 Deficiency of other specified B group vitamins: Secondary | ICD-10-CM

## 2017-10-28 DIAGNOSIS — R195 Other fecal abnormalities: Secondary | ICD-10-CM | POA: Diagnosis not present

## 2017-10-28 DIAGNOSIS — Z6835 Body mass index (BMI) 35.0-35.9, adult: Secondary | ICD-10-CM | POA: Diagnosis not present

## 2017-10-28 DIAGNOSIS — R310 Gross hematuria: Secondary | ICD-10-CM | POA: Diagnosis not present

## 2017-10-28 DIAGNOSIS — E782 Mixed hyperlipidemia: Secondary | ICD-10-CM

## 2017-10-28 MED ORDER — METFORMIN HCL 1000 MG PO TABS: 1000.0000 mg | ORAL_TABLET | Freq: Two times a day (BID) | ORAL | 3 refills | Status: DC

## 2017-10-28 MED ORDER — GLIPIZIDE 10 MG PO TABS: 10.0000 mg | ORAL_TABLET | Freq: Two times a day (BID) | ORAL | 3 refills | Status: DC

## 2017-10-28 NOTE — Assessment & Plan Note (Signed)
GI did not recommend further eval for this since he is high risk for colonoscopy  Did not recommend further screening

## 2017-10-28 NOTE — Assessment & Plan Note (Signed)
Elevated TSH but no clinical changes Lab Results  Component Value Date   TSH 5.86 (H) 10/15/2017   will re check this in 3 mo before next visit with FT4

## 2017-10-28 NOTE — Assessment & Plan Note (Signed)
No further episodes On [place] Will let us know if it re occurs  Declines urology referral

## 2017-10-28 NOTE — Progress Notes (Signed)
Subjective:    Patient ID: Ryan Garcia, male    DOB: [birth year], 84 y.o.   MRN: [id number]  HPI Here for health maintenance exam and to review chronic medical problems    Had skin cancer surgery recently on face  Feeling good overall  Nothing new going on  No new heart complaints   Wt Readings from Last 3 Encounters:  10/28/17 254 lb (115.2 kg)  10/15/17 254 lb 8 oz (115.4 kg)  09/24/17 256 lb 6.4 oz (116.3 kg)  wt is down 2 lb Keeping very busy- active  34.93 kg/m   Had amw on 4/19 -no concerns   Eye exam 1/19   Not wearing hearing aides today   zostavax 5/14- ? If interested in shingrix   Prostate health Hx of BPH - dribbles occ/ no worse  Gets up once - nocturia at 5 am  Lab Results  Component Value Date   PSA 2.25 09/07/2016   PSA 2.12 08/19/2015   PSA 1.87 05/25/2013   Colonoscopy 2/12 -adenomas  No recall due to heart problems and age  No blood in stool or stool changes  Had pos cologuard 5/18- GI did not recommend further eval for this   No blood in urine   bp is up on first check today (just took his medicine)  No cp or palpitations or headaches or edema  No side effects to medicines  BP Readings from Last 3 Encounters:  10/28/17 140/66  10/15/17 110/60  09/24/17 138/60       DM2 Lab Results  Component Value Date   HGBA1C 7.6 (H) 10/15/2017  down from 7.8 He feels bad if glucose goes under 100  On glipizide and lantus and metformin  Ace for renal protection  Not watching diet  Does not want to go up on lantus  No low glucose levels   On cpap for OSA  Hyperlipidemia in setting of CAD Lab Results  Component Value Date   CHOL 122 10/15/2017   CHOL 127 08/08/2017   CHOL 139 09/07/2016   Lab Results  Component Value Date   HDL 34.20 (L) 10/15/2017   HDL 37 (L) 08/08/2017   HDL 35.60 (L) 09/07/2016   Lab Results  Component Value Date   LDLCALC 69 10/15/2017   LDLCALC 74 08/08/2017   LDLCALC 86 09/07/2016   Lab Results    Component Value Date   TRIG 96.0 10/15/2017   TRIG 81 08/08/2017   TRIG 88.0 09/07/2016   Lab Results  Component Value Date   CHOLHDL 4 10/15/2017   CHOLHDL 3.4 08/08/2017   CHOLHDL 4 09/07/2016   Lab Results  Component Value Date   LDLDIRECT 78.5 11/17/2012   LDLDIRECT 156.5 05/09/2010   LDLDIRECT 152.9 08/09/2009  statin and diet   Hx of B12 def Lab Results  Component Value Date   VITAMINB12 254 10/15/2017  this is down   TSH is up slightly  Lab Results  Component Value Date   TSH 5.86 (H) 10/15/2017   no sluggish No skin or hair changes  Review of Systems  Constitutional: Negative for activity change, appetite change, fatigue, fever and unexpected weight change.  HENT: Negative for congestion, rhinorrhea, sore throat and trouble swallowing.   Eyes: Negative for pain, redness, itching and visual disturbance.  Respiratory: Negative for cough, chest tightness, shortness of breath and wheezing.   Cardiovascular: Negative for chest pain and palpitations.  Gastrointestinal: Negative for abdominal pain, blood in stool, constipation, diarrhea and nausea.  Endocrine: Negative for cold intolerance, heat intolerance, polydipsia and polyuria.  Genitourinary: Negative for difficulty urinating, dysuria, frequency and urgency.  Musculoskeletal: Negative for arthralgias, joint swelling and myalgias.  Skin: Positive for wound. Negative for pallor and rash.       Skin cancer surgery site on face is healing    Neurological: Negative for dizziness, tremors, weakness, numbness and headaches.  Hematological: Negative for adenopathy. Does not bruise/bleed easily.  Psychiatric/Behavioral: Negative for decreased concentration and dysphoric mood. The patient is not nervous/anxious.        Objective:   Physical Exam  Constitutional: He appears well-developed and well-nourished. No distress.  obese and well appearing   HENT:  Head: Normocephalic.  Right Ear: External ear normal.   Left Ear: External ear normal.  Nose: Nose normal.  Mouth/Throat: Oropharynx is clear and moist.  Vertical incision /sutures above R eye look healthy and healing Some ecchymosis under R eye from this   Hard of hearing-not wearing hearing aides today  Eyes: Pupils are equal, round, and reactive to light. Conjunctivae and EOM are normal. Right eye exhibits no discharge. Left eye exhibits no discharge. No scleral icterus.  Neck: Normal range of motion. Neck supple. No JVD present. Carotid bruit is not present. No thyromegaly present.  Cardiovascular: Normal rate, regular rhythm, normal heart sounds and intact distal pulses. Exam reveals no gallop.  Pulmonary/Chest: Effort normal and breath sounds normal. No respiratory distress. He has no wheezes. He exhibits no tenderness.  Abdominal: Soft. Bowel sounds are normal. He exhibits no distension, no abdominal bruit and no mass. There is no tenderness.  Musculoskeletal: He exhibits no edema or tenderness.  Lymphadenopathy:    He has no cervical adenopathy.  Neurological: He is alert. He has normal reflexes. No cranial nerve deficit. He exhibits normal muscle tone. Coordination normal.  Less steady today- given help to get up/downfrom table   Skin: Skin is warm and dry. No rash noted. No erythema. No pallor.  Incision above R eye - healing  AKs and SKs and lentigines diffusely  Psychiatric: He has a normal mood and affect.  Pleasant  Voiced concern over grand daughter in surgery today          Assessment & Plan:   Problem List Items Addressed This Visit      Cardiovascular and Mediastinum   Essential hypertension    bp in fair control at this time  BP Readings from Last 1 Encounters:  10/28/17 130/65   No changes needed Disc lifstyle change with low sodium diet and exercise  Labs reviewed  Enc wt loss         Endocrine   Subclinical hypothyroidism    Elevated TSH but no clinical changes Lab Results  Component Value Date    TSH 5.86 (H) 10/15/2017   will re check this in 3 mo before next visit with FT4       Relevant Orders   TSH   T4, free   Uncontrolled type 2 diabetes mellitus with hyperglycemia ([place])    Lab Results  Component Value Date   HGBA1C 7.6 (H) 10/15/2017   Pt will not watch diet Also not open to inc lantus or changing medicine He is afraid of low glucose /has not had lately  Ace for renal prot Disc eye and foot care  F/u 3 mo  Did enc to think about changing diet as well       Relevant Medications   glipiZIDE (GLUCOTROL) 10 MG tablet  metFORMIN (GLUCOPHAGE) 1000 MG tablet   Other Relevant Orders   Hemoglobin A1c     Genitourinary   BPH (benign prostatic hyperplasia)    No changes in symptoms  Has out aged psa  Rev last Lab Results  Component Value Date   PSA 2.25 09/07/2016   PSA 2.12 08/19/2015   PSA 1.87 05/25/2013   Does not desire treatment -nocturia is once per night      Gross hematuria    No further episodes On Brillenta Will let us know if it re occurs  Declines urology referral         Other   B12 deficiency    Lab Results  Component Value Date   VITAMINB12 254 10/15/2017   Will incr dosing to daily (current twice week) for B12 since it is drifting down      Class 2 severe obesity due to excess calories with serious comorbidity and body mass index (BMI) of 35.0 to 35.9 in adult [facility])    Discussed how this problem influences overall health and the risks it imposes  Reviewed plan for weight loss with lower calorie diet (via better food choices and also portion control or program like weight watchers) and exercise building up to or more than 30 minutes 5 days per week including some aerobic activity   He is not currently interested in lifestyle change       Relevant Medications   glipiZIDE (GLUCOTROL) 10 MG tablet   metFORMIN (GLUCOPHAGE) 1000 MG tablet   HLD (hyperlipidemia)    High dose pravastatin  LDL is well controlled HDL is low  Disc  goals for lipids and reasons to control them Rev labs with pt Rev low sat fat diet in detail       Hx of skin cancer, basal cell    Recent facial surgery above R eye- incision and stitches look good and healing       Routine general medical examination at a health care facility - Primary    Reviewed health habits including diet and exercise and skin cancer prevention Reviewed appropriate screening tests for age  Also reviewed health mt list, fam hx and immunization status , as well as social and family history   See HPI Labs reviewed  amw reviewed  Disc shingrix vaccine  Disc exercise/fish to help raise HDL TSH high-re check at f/u

## 2017-10-28 NOTE — Assessment & Plan Note (Signed)
Lab Results  Component Value Date   VITAMINB12 254 10/15/2017   Will incr dosing to daily (current twice week) for B12 since it is drifting down

## 2017-10-28 NOTE — Assessment & Plan Note (Signed)
Recent facial surgery above R eye- incision and stitches look good and healing

## 2017-10-28 NOTE — Assessment & Plan Note (Signed)
Lab Results  Component Value Date   HGBA1C 7.6 (H) 10/15/2017   Pt will not watch diet Also not open to inc lantus or changing medicine He is afraid of low glucose /has not had lately  Ace for renal prot Disc eye and foot care  F/u 3 mo  Did enc to think about changing diet as well

## 2017-10-28 NOTE — Assessment & Plan Note (Signed)
bp in fair control at this time  BP Readings from Last 1 Encounters:  10/28/17 130/65   No changes needed Disc lifstyle change with low sodium diet and exercise  Labs reviewed  Enc wt loss

## 2017-10-28 NOTE — Patient Instructions (Addendum)
If you are interested in the new shingles vaccine (Shingrix) - call your local pharmacy to check on coverage and availability  If you are interested you can get on a wait list at your pharmacy   Please increase your B12 dose to every day- since your level is drifting down   Your thyroid number is a bit off - not sure if you may become hypothyroid in the future We will check this in 3 months   Take care of yourself Please watch sugar and carbs in diet for diabetes  A1C is 7.6

## 2017-10-28 NOTE — Assessment & Plan Note (Signed)
Reviewed health habits including diet and exercise and skin cancer prevention Reviewed appropriate screening tests for age  Also reviewed health mt list, fam hx and immunization status , as well as social and family history   See HPI Labs reviewed  amw reviewed  Disc shingrix vaccine  Disc exercise/fish to help raise HDL TSH high-re check at f/u

## 2017-10-28 NOTE — Assessment & Plan Note (Signed)
Discussed how this problem influences overall health and the risks it imposes  Reviewed plan for weight loss with lower calorie diet (via better food choices and also portion control or program like weight watchers) and exercise building up to or more than 30 minutes 5 days per week including some aerobic activity   He is not currently interested in lifestyle change

## 2017-10-28 NOTE — Assessment & Plan Note (Signed)
No changes in symptoms  Has out aged psa  Rev last Lab Results  Component Value Date   PSA 2.25 09/07/2016   PSA 2.12 08/19/2015   PSA 1.87 05/25/2013   Does not desire treatment -nocturia is once per night

## 2017-10-28 NOTE — Assessment & Plan Note (Signed)
High dose pravastatin  LDL is well controlled HDL is low  Disc goals for lipids and reasons to control them Rev labs with pt Rev low sat fat diet in detail

## 2017-11-01 ENCOUNTER — Other Ambulatory Visit: Payer: Self-pay | Admitting: Student

## 2017-11-04 NOTE — Telephone Encounter (Signed)
REFILL 

## 2017-11-20 DIAGNOSIS — G4733 Obstructive sleep apnea (adult) (pediatric): Secondary | ICD-10-CM | POA: Diagnosis not present

## 2017-11-20 DIAGNOSIS — R0689 Other abnormalities of breathing: Secondary | ICD-10-CM | POA: Diagnosis not present

## 2017-11-21 ENCOUNTER — Ambulatory Visit: Payer: Self-pay | Admitting: *Deleted

## 2017-11-21 NOTE — Telephone Encounter (Signed)
I spoke with pt; pt said dentist wants pt to be checked to see if blood is too thin; pt is not bleeding now. Also [facility] scheduled appt for back pain that pt has had for couple of months. Made appt for 30' 11/22/17 at 3:45. If pt condition where to worsen or gums start to spontaneously bleed prior to appt, pt will go to ED. FYI to DR [employer].

## 2017-11-21 NOTE — Telephone Encounter (Addendum)
Patient bled badly from top teeth. The dentist stated that the bleeding he had today was worse than previous cleaning appointments. He also states he has had pain in his kidney area- back for a couple weeks. Patient does use a blood thinner and may need to have his levels checked.  Answer Assessment - Initial Assessment Questions 1. SYMPTOMS: "Do you have any symptoms?"     Patient had dental appointment for 6 month cleaning today and the dentist was concerned about his excessive bleeding in mouth. 2. SEVERITY: If symptoms are present, ask "Are they mild, moderate or severe?"     Patient has not reported any abnormal bruising. Patient also has had pain in kidney for 2 weeks. No urinary problems reported. Patient is not present to triage.  Protocols used: MEDICATION QUESTION CALL-A-AH  Wife will have husband call back when he gets home to discuss the pain that he has been having in his kidney- so the information can be sent to office- or appointment can be made.

## 2017-11-21 NOTE — Telephone Encounter (Signed)
According to med list he is on both asa 81 mg and Brillinta -2 blood thinners/so he will have easy bleeding  Is his dentist aware of this?   Does he need dental surgery ? Thanks

## 2017-11-21 NOTE — Telephone Encounter (Signed)
  Reason for Disposition . [1] Age > 73 AND [2] no history of prior similar back pain  Answer Assessment - Initial Assessment Questions 1. ONSET: "When did the pain begin?"      2 months or less- started out mild- can feel it when he moves certain ways 2. LOCATION: "Where does it hurt?" (upper, mid or lower back)     Right side- between hip and ribs 3. SEVERITY: "How bad is the pain?"  (e.g., Scale 1-10; mild, moderate, or severe)   - MILD (1-3): doesn't interfere with normal activities    - MODERATE (4-7): interferes with normal activities or awakens from sleep    - SEVERE (8-10): excruciating pain, unable to do any normal activities      3-4- when feels pain- dull pain with an edge 4. PATTERN: "Is the pain constant?" (e.g., yes, no; constant, intermittent)      Intermittent- when doing certain actvities 5. RADIATION: "Does the pain shoot into your legs or elsewhere?"     no 6. CAUSE:  "What do you think is causing the back pain?"      Possible kidney- 7. BACK OVERUSE:  "Any recent lifting of heavy objects, strenuous work or exercise?"     L elbow pain in joint - no over use 8. MEDICATIONS: "What have you taken so far for the pain?" (e.g., nothing, acetaminophen, NSAIDS)     nothing 9. NEUROLOGIC SYMPTOMS: "Do you have any weakness, numbness, or problems with bowel/bladder control?"     No new symptoms 10. OTHER SYMPTOMS: "Do you have any other symptoms?" (e.g., fever, abdominal pain, burning with urination, blood in urine)       no 11. PREGNANCY: "Is there any chance you are pregnant?" (e.g., yes, no; LMP)       n/a  Protocols used: BACK PAIN-A-AH

## 2017-11-22 ENCOUNTER — Ambulatory Visit: Payer: Self-pay | Admitting: *Deleted

## 2017-11-22 ENCOUNTER — Encounter: Payer: Self-pay | Admitting: Family Medicine

## 2017-11-22 ENCOUNTER — Ambulatory Visit (INDEPENDENT_AMBULATORY_CARE_PROVIDER_SITE_OTHER): Payer: Medicare HMO | Admitting: Family Medicine

## 2017-11-22 VITALS — BP 140/60 | HR 76 | Temp 98.2°F | Ht 71.5 in | Wt 255.5 lb

## 2017-11-22 DIAGNOSIS — K068 Other specified disorders of gingiva and edentulous alveolar ridge: Secondary | ICD-10-CM | POA: Diagnosis not present

## 2017-11-22 DIAGNOSIS — M545 Low back pain, unspecified: Secondary | ICD-10-CM

## 2017-11-22 DIAGNOSIS — I10 Essential (primary) hypertension: Secondary | ICD-10-CM | POA: Diagnosis not present

## 2017-11-22 DIAGNOSIS — G8929 Other chronic pain: Secondary | ICD-10-CM | POA: Diagnosis not present

## 2017-11-22 MED ORDER — TICAGRELOR 90 MG PO TABS: 90.0000 mg | ORAL_TABLET | Freq: Two times a day (BID) | ORAL | 3 refills | Status: DC

## 2017-11-22 MED ORDER — METFORMIN HCL 1000 MG PO TABS: 1000.0000 mg | ORAL_TABLET | Freq: Two times a day (BID) | ORAL | 3 refills | Status: DC

## 2017-11-22 NOTE — Patient Instructions (Signed)
Take a look at the back stretches-they may help  Also look into getting a new mattress   The gum bleeding does not worry me in light of 2 blood thinners  Take care of yourself   Stay hydrated in the heat

## 2017-11-22 NOTE — Telephone Encounter (Signed)
Spoke with Dr. Glori Bickers regarding pt and she advise me that if pt's bleeding has stopped then he doesn't need an appt but we can still do clotting labs if he wants unless he still wants to be seen for back pain, Dr. Glori Bickers asked me to call and check on pt and see how his bleeding is doing today, called but no answer so left VM requesting pt to all the office back  CRM created

## 2017-11-22 NOTE — Telephone Encounter (Signed)
Lucynda RN also noted;   See triage notes.  I read Dr. Marliss Coots message to pt regarding lab work for clotting factors after his dentist said he was bleeding after having his teeth cleaned. He will keep 3:45 appt for his back pain today.

## 2017-11-22 NOTE — Progress Notes (Signed)
Subjective:    Patient ID: Ryan Garcia, male    DOB: 1933/08/26, 84 y.o.   MRN: [id number]  HPI Here for back pain   Also reported bleeding with dental procedure  Had a dental cleaning - good check up - just bled a lot  On Brillinta and asa  No longer bleeding by the time he left   Wt Readings from Last 3 Encounters:  11/22/17 255 lb 8 oz (115.9 kg)  10/28/17 254 lb (115.2 kg)  10/15/17 254 lb 8 oz (115.4 kg)   35.14 kg/m   Sees Dr Stanford Breed for cardiology f/u was march   Back pain for about a month  Low back  Worse in am- when he pushes out of bed  Wondered about his kidney This am- rad from hip to back -not severe  Not constant- just with movement (push/pull) No radiation to legs   He does work outdoors   No more blood in urine   Patient Active Problem List   Diagnosis Date Noted  . Bleeding gums 11/22/2017  . Uncontrolled type 2 diabetes mellitus with hyperglycemia ([place]) 10/28/2017  . Subclinical hypothyroidism 10/28/2017  . Gross hematuria 09/23/2017  . Scoliosis   . Obstructive sleep apnea on CPAP   . Hx of skin cancer, basal cell   . HLD (hyperlipidemia)   . GERD (gastroesophageal reflux disease)   . Chronic upper back pain   . Arthritis   . Allergy   . AC (acromioclavicular) joint bone spurs   . NSTEMI (non-ST elevated myocardial infarction) ([place]) 06/18/2017  . Actinic keratoses 04/29/2017  . Positive colorectal cancer screening using Cologuard test 10/31/2016  . BPH (benign prostatic hyperplasia) 09/04/2016  . B12 deficiency 11/22/2015  . Routine general medical examination at a health care facility 08/26/2015  . Abnormal nuclear cardiac imaging test   . Dyspnea on exertion 11/27/2013  . Encounter for examination of normal volunteer in research study 05/29/2013  . Prostate cancer screening 07/15/2012  . CAD S/P percutaneous coronary angioplasty 05/17/2011  . Nonspecific abnormal results of cardiovascular function study 04/30/2011  . Abnormal EKG  04/06/2011  . Left arm pain 04/06/2011  . BACK PAIN 01/13/2010  . Insulin dependent diabetes mellitus ([place]) 10/04/2006  . Class 2 severe obesity due to excess calories with serious comorbidity and body mass index (BMI) of 35.0 to 35.9 in adult ([place]) 10/04/2006  . ERECTILE DYSFUNCTION 10/04/2006  . Essential hypertension 10/04/2006  . ALLERGIC RHINITIS 10/04/2006  . OSTEOARTHRITIS 10/04/2006  . Sleep apnea 10/04/2006  . EDEMA 10/04/2006  . SKIN CANCER, HX OF 10/04/2006   Past Medical History:  Diagnosis Date  . AC (acromioclavicular) joint bone spurs    lt ankle  . Allergy    allergic rhinitis  . Arthritis    OA  . BPH (benign prostatic hyperplasia)    microwave tx of prostate  . CAD (coronary artery disease)    a. s/p cath in 2012 showing 70-75% LAD stenosis, 75% D1 stenosis, 75% OM1, 60-70% RCA stenosis, and 80% PDA --> medical therapy pursued b. similar findings by cath in 2016; 06/2017 DES to distal RCA  . Chronic upper back pain   . Diabetes mellitus    type II x8 years  . GERD (gastroesophageal reflux disease)   . HLD (hyperlipidemia)    10/12: TC 208, TG 213, HDL 36, LDL 129  . Hx of skin cancer, basal cell    many skin cancers removed/under constant treatment.  . Hypertension   .  NSTEMI (non-ST elevated myocardial infarction) ([place]) 06/18/2017   DES to distal RCA  . Obstructive sleep apnea on CPAP   . Scoliosis    Past Surgical History:  Procedure Laterality Date  . BREAST SURGERY  2009   breast lump removed benign  . CARDIAC CATHETERIZATION N/A 03/15/2015   Procedure: Left Heart Cath and Coronary Angiography;  Surgeon: Belva Crome, MD;  Location: [facility];  Service: Cardiovascular;  Laterality: N/A;  . CORONARY STENT INTERVENTION N/A 06/18/2017   Procedure: CORONARY STENT INTERVENTION;  Surgeon: Jettie Booze, MD;  Location: [facility];  Service: Cardiovascular;  Laterality: N/A;  RCA  . LEFT HEART CATH AND CORONARY ANGIOGRAPHY N/A  06/18/2017   Procedure: LEFT HEART CATH AND CORONARY ANGIOGRAPHY;  Surgeon: Jettie Booze, MD;  Location: [facility];  Service: Cardiovascular;  Laterality: N/A;  . ULTRASOUND GUIDANCE FOR VASCULAR ACCESS  06/18/2017   Procedure: Ultrasound Guidance For Vascular Access;  Surgeon: Jettie Booze, MD;  Location: [facility];  Service: Cardiovascular;;  right radial, right femoral   Social History   Tobacco Use  . Smoking status: Never Smoker  . Smokeless tobacco: Never Used  Substance Use Topics  . Alcohol use: No    Alcohol/week: 0.0 oz  . Drug use: No   Family History  Problem Relation Age of Onset  . Heart attack Brother   . Colon cancer Neg Hx    Allergies  Allergen Reactions  . Loratadine     REACTION: not effective  . Simvastatin     REACTION: intolerant   Current Outpatient Medications on File Prior to Visit  Medication Sig Dispense Refill  . aspirin 81 MG tablet Take 81 mg by mouth daily.      . Blood Glucose Monitoring Suppl (TRUE METRIX AIR GLUCOSE METER) W/DEVICE KIT 1 kit by Other route 2 (two) times daily. Check blood sugar twice daily and as directed. Dx E11.65 1 kit 0  . cetirizine (ZYRTEC) 10 MG tablet TAKE 1 TABLET EVERY DAY 90 tablet 1  . Coenzyme Q10 (CO Q 10 PO) Take 1 tablet by mouth daily.     . Cyanocobalamin (VITAMIN B12 PO) Take 1 tablet by mouth 2 (two) times a week.     . fluorouracil (EFUDEX) 5 % cream Apply 2 application topically daily as needed (skin lesions).     Marland Kitchen glipiZIDE (GLUCOTROL) 10 MG tablet Take 1 tablet (10 mg total) by mouth 2 (two) times daily with a meal. 180 tablet 3  . glucose blood (TRUE METRIX BLOOD GLUCOSE TEST) test strip CHECK BLOOD SUGAR TWICE DAILY AND AS NEEDED FOR DM (dx. E11.65) 300 each 1  . hydrochlorothiazide (HYDRODIURIL) 12.5 MG tablet Take 1 tablet (12.5 mg total) daily by mouth. 90 tablet 2  . Insulin Glargine (LANTUS SOLOSTAR) 100 UNIT/ML Solostar Pen INJECT  45 UNITS SUBCUTANEOUSLY AT  BEDTIME 40 mL 3  . Insulin Pen Needle (UNIFINE PENTIPS) 31G X 6 MM MISC USE PEN NEEDLES WHEN TAKING LANTUS AT BEDTIME 100 each 1  . isosorbide mononitrate (IMDUR) 30 MG 24 hr tablet TAKE 1 TABLET EVERY DAY 90 tablet 1  . lisinopril (PRINIVIL,ZESTRIL) 20 MG tablet Take 1 tablet (20 mg total) by mouth daily. 90 tablet 3  . metoprolol tartrate (LOPRESSOR) 25 MG tablet Take 0.5 tablets (12.5 mg total) by mouth 2 (two) times daily. 30 tablet 3  . nitroGLYCERIN (NITROSTAT) 0.4 MG SL tablet Place 1 tablet (0.4 mg total) every  5 (five) minutes as needed under the tongue for chest pain. 25 tablet 3  . triamcinolone (NASACORT) 55 MCG/ACT AERO nasal inhaler USE ONE SPRAYS EACH NOSTRIL EVERY DAY as needed    . TRUEPLUS LANCETS 30G MISC Check blood sugar twice daily and as directed. Dx E11.65 200 each 1  . pravastatin (PRAVACHOL) 80 MG tablet Take 1 tablet (80 mg total) by mouth every evening. 90 tablet 3   No current facility-administered medications on file prior to visit.     Review of Systems  Constitutional: Negative for activity change, appetite change, fatigue, fever and unexpected weight change.  HENT: Positive for dental problem. Negative for congestion, rhinorrhea, sore throat and trouble swallowing.   Eyes: Negative for pain, redness, itching and visual disturbance.  Respiratory: Positive for shortness of breath. Negative for cough, chest tightness and wheezing.        Baseline sob on exertion   Cardiovascular: Negative for chest pain and palpitations.  Gastrointestinal: Negative for abdominal pain, blood in stool, constipation, diarrhea and nausea.  Endocrine: Negative for cold intolerance, heat intolerance, polydipsia and polyuria.  Genitourinary: Negative for difficulty urinating, dysuria, frequency and urgency.  Musculoskeletal: Positive for back pain. Negative for arthralgias, joint swelling and myalgias.  Skin: Negative for pallor and rash.  Neurological: Negative for dizziness, tremors,  weakness, numbness and headaches.  Hematological: Negative for adenopathy. Does not bruise/bleed easily.  Psychiatric/Behavioral: Negative for decreased concentration and dysphoric mood. The patient is not nervous/anxious.        Objective:   Physical Exam  Constitutional: He appears well-developed and well-nourished. No distress.  obese and well appearing   HENT:  Head: Normocephalic and atraumatic.  Nose: Nose normal.  Dental and gum health appeared to be good   Eyes: Pupils are equal, round, and reactive to light. Conjunctivae and EOM are normal. No scleral icterus.  Neck: Normal range of motion. Neck supple.  Cardiovascular: Normal rate and regular rhythm.  Pulmonary/Chest: Effort normal and breath sounds normal. He has no wheezes. He has no rales.  Abdominal: Soft. Bowel sounds are normal. He exhibits no distension. There is no tenderness.  Musculoskeletal: He exhibits tenderness. He exhibits no edema or deformity.       Right shoulder: He exhibits tenderness. He exhibits normal range of motion, no bony tenderness, no swelling, no crepitus, no deformity and normal pulse.       Lumbar back: He exhibits decreased range of motion, tenderness and spasm. He exhibits no bony tenderness and no edema.  Nl rom with reproduction of back pain on L lateral bend and twist  No bony tenderness  Some R lumbar muscle tightness   Neg SLR  Lymphadenopathy:    He has no cervical adenopathy.  Neurological: He is alert. He has normal strength and normal reflexes. He displays no atrophy. No cranial nerve deficit or sensory deficit. He exhibits normal muscle tone. Coordination normal.  Negative SLR  Skin: Skin is warm and dry. No rash noted. No erythema. No pallor.  Bruises easily  Psychiatric: He has a normal mood and affect.  Pleasant           Assessment & Plan:   Problem List Items Addressed This Visit      Cardiovascular and Mediastinum   Essential hypertension    bp in fair control  at this time  BP Readings from Last 1 Encounters:  11/22/17 140/60   No changes needed Most recent labs reviewed  Disc lifstyle change with low sodium diet  and exercise          Other   Bleeding gums    During dental cleaning  Not unexpected due to asa 81 mg and Brillinta  Reassuring  The bleeding did stop by the time he left the dentist       Right low back pain - Primary    More pain in Right low back and flank area Reassuring exam- reproduces with L lateral bend and twist  He continues to work and be active Back stretch handout given  Also recommend gentle heat  Pt states not that bothersome No neuro s/s  Will watch

## 2017-11-22 NOTE — Telephone Encounter (Signed)
Pt never called back so Dr. Glori Bickers will just talk directly to him at his appt

## 2017-11-22 NOTE — Telephone Encounter (Signed)
Call was returned regarding gums bleeding.    "My gums stopped bleeding at the dentist's office".   "I'm not having bleeding since I left the dentist's office".    The dentist was wondering if I might need blood work for my blood clotting.   I let him know if he felt he needed blood work he can just come in and does not need an appt to have labs done.  He didn't mention if he was going to have labs done or not. He  is going to keep his appt this afternoon for his back pain.

## 2017-11-25 ENCOUNTER — Other Ambulatory Visit: Payer: Self-pay | Admitting: Family Medicine

## 2017-11-25 NOTE — Assessment & Plan Note (Signed)
bp in fair control at this time  BP Readings from Last 1 Encounters:  11/22/17 140/60   No changes needed Most recent labs reviewed  Disc lifstyle change with low sodium diet and exercise

## 2017-11-25 NOTE — Assessment & Plan Note (Signed)
More pain in Right low back and flank area Reassuring exam- reproduces with L lateral bend and twist  He continues to work and be active Back stretch handout given  Also recommend gentle heat  Pt states not that bothersome No neuro s/s  Will watch

## 2017-11-25 NOTE — Assessment & Plan Note (Signed)
During dental cleaning  Not unexpected due to asa 81 mg and Brillinta  Reassuring  The bleeding did stop by the time he left the dentist

## 2017-12-10 ENCOUNTER — Encounter: Payer: Medicare HMO | Admitting: *Deleted

## 2017-12-10 VITALS — BP 147/75 | HR 56 | Wt 250.0 lb

## 2017-12-10 DIAGNOSIS — Z006 Encounter for examination for normal comparison and control in clinical research program: Secondary | ICD-10-CM

## 2017-12-10 NOTE — Progress Notes (Signed)
Subject met inclusion and exclusion criteria. The informed consent form, study requirements and expectation were reviewed with the subject and questions and concerns were addressed prior to the signing of the consent form. The subject verbalized understanding of the trial requirements. The subject agreed to participate in the AEGIS II trial and signed the informed consent. The informed consent was obtained prior to performance of any protocol-specific procedure for the subject. A copy of the signed informed consent was given to the subject and a copy was placed in the subject's medical record.  Subject re-consented to [place]  Version 2.0 Local IRB approved 09/17/2017

## 2017-12-10 NOTE — Progress Notes (Signed)
Visit [facility] Pt doing well. No complaints of cp.                                   "CONSENT"   YES     NO   Continuing further Investigational Product and study visits for follow-up? _0  _1   Continuing consent from future biomedical research _2  _3                                      "EVENTS"    YES     NO  AE   (IF YES SEE SOURCE) _4  _5   SAE  (IF YES SEE SOURCE) _6  _7   ENDPOINT   (IF YES SEE SOURCE) _8  _9   REVASCULARIZATION  (IF YES SEE SOURCE) _10  _11   AMPUTATION   (IF YES SEE SOURCE) _12  _13   TROPONIN'S  (IF YES SEE SOURCE) _14  _15    Lifestyle Adherence Assessment:    YES NO  Abstinence from smoking/remaining tobacco free X   Cardiac Diet X   Routine physical activity and/or cardiac rehabilitation X     Current Outpatient Medications:  .  aspirin 81 MG tablet, Take 81 mg by mouth daily.  , Disp: , Rfl:  .  Blood Glucose Monitoring Suppl (TRUE METRIX AIR GLUCOSE METER) W/DEVICE KIT, 1 kit by Other route 2 (two) times daily. Check blood sugar twice daily and as directed. Dx E11.65, Disp: 1 kit, Rfl: 0 .  cetirizine (ZYRTEC) 10 MG tablet, TAKE 1 TABLET EVERY DAY, Disp: 90 tablet, Rfl: 1 .  Coenzyme Q10 (CO Q 10 PO), Take 1 tablet by mouth daily. , Disp: , Rfl:  .  Cyanocobalamin (VITAMIN B12 PO), Take 1 tablet by mouth 2 (two) times a week. , Disp: , Rfl:  .  fluorouracil (EFUDEX) 5 % cream, Apply 2 application topically daily as needed (skin lesions). , Disp: , Rfl:  .  glipiZIDE (GLUCOTROL) 10 MG tablet, Take 1 tablet (10 mg total) by mouth 2 (two) times daily with a meal., Disp: 180 tablet, Rfl: 3 .  glucose blood (TRUE METRIX BLOOD GLUCOSE TEST) test strip, CHECK BLOOD SUGAR TWICE DAILY AND AS NEEDED FOR DM (dx. E11.65), Disp: 300 each, Rfl: 1 .  hydrochlorothiazide (HYDRODIURIL) 12.5 MG tablet, Take 1 tablet (12.5 mg total) daily by mouth., Disp: 90 tablet, Rfl: 2 .  Insulin Glargine (LANTUS SOLOSTAR) 100 UNIT/ML Solostar Pen, INJECT  45 UNITS SUBCUTANEOUSLY AT BEDTIME,  Disp: 40 mL, Rfl: 3 .  Insulin Pen Needle (UNIFINE PENTIPS) 31G X 6 MM MISC, USE PEN NEEDLES WHEN TAKING LANTUS AT BEDTIME, Disp: 100 each, Rfl: 1 .  isosorbide mononitrate (IMDUR) 30 MG 24 hr tablet, TAKE 1 TABLET EVERY DAY, Disp: 90 tablet, Rfl: 1 .  lisinopril (PRINIVIL,ZESTRIL) 20 MG tablet, Take 1 tablet (20 mg total) by mouth daily., Disp: 90 tablet, Rfl: 3 .  metFORMIN (GLUCOPHAGE) 1000 MG tablet, Take 1 tablet (1,000 mg total) by mouth 2 (two) times daily., Disp: 180 tablet, Rfl: 3 .  metoprolol tartrate (LOPRESSOR) 25 MG tablet, Take 0.5 tablets (12.5 mg total) by mouth 2 (two) times daily., Disp: 30 tablet, Rfl: 3 .  nitroGLYCERIN (NITROSTAT) 0.4 MG SL tablet, Place 1 tablet (0.4 mg total) every 5 (five) minutes as needed under the tongue for chest pain., Disp: 25 tablet, Rfl: 3 .  ticagrelor (BRILINTA) 90  MG TABS tablet, Take 1 tablet (90 mg total) by mouth 2 (two) times daily., Disp: 180 tablet, Rfl: 3 .  triamcinolone (NASACORT) 55 MCG/ACT AERO nasal inhaler, USE ONE SPRAYS EACH NOSTRIL EVERY DAY as needed, Disp: , Rfl:  .  TRUEPLUS LANCETS 30G MISC, Check blood sugar twice daily and as directed. Dx E11.65, Disp: 200 each, Rfl: 1 .  pravastatin (PRAVACHOL) 80 MG tablet, Take 1 tablet (80 mg total) by mouth every evening., Disp: 90 tablet, Rfl: 3

## 2017-12-18 ENCOUNTER — Other Ambulatory Visit: Payer: Self-pay | Admitting: Family Medicine

## 2017-12-18 ENCOUNTER — Other Ambulatory Visit: Payer: Self-pay | Admitting: Cardiology

## 2017-12-23 ENCOUNTER — Other Ambulatory Visit: Payer: Self-pay | Admitting: Student

## 2017-12-30 ENCOUNTER — Telehealth: Payer: Self-pay | Admitting: *Deleted

## 2017-12-30 NOTE — Telephone Encounter (Signed)
Called and left message for patient to call me back about his medications.   Need to know the mg of his CO Q 10 and Vitamin B12?

## 2018-01-13 NOTE — Telephone Encounter (Signed)
Updated meds

## 2018-01-14 ENCOUNTER — Other Ambulatory Visit (INDEPENDENT_AMBULATORY_CARE_PROVIDER_SITE_OTHER): Payer: Medicare HMO

## 2018-01-14 DIAGNOSIS — E1165 Type 2 diabetes mellitus with hyperglycemia: Secondary | ICD-10-CM | POA: Diagnosis not present

## 2018-01-14 DIAGNOSIS — E039 Hypothyroidism, unspecified: Secondary | ICD-10-CM | POA: Diagnosis not present

## 2018-01-14 DIAGNOSIS — E038 Other specified hypothyroidism: Secondary | ICD-10-CM

## 2018-01-14 LAB — T4, FREE: FREE T4: 0.71 ng/dL (ref 0.60–1.60)

## 2018-01-14 LAB — TSH: TSH: 4.7 u[IU]/mL — AB (ref 0.35–4.50)

## 2018-01-14 LAB — HEMOGLOBIN A1C: Hgb A1c MFr Bld: 8 % — ABNORMAL HIGH (ref 4.6–6.5)

## 2018-01-21 ENCOUNTER — Ambulatory Visit: Payer: Medicare HMO | Admitting: Family Medicine

## 2018-01-23 ENCOUNTER — Other Ambulatory Visit: Payer: Medicare HMO

## 2018-01-27 ENCOUNTER — Ambulatory Visit: Payer: Medicare HMO | Admitting: Family Medicine

## 2018-01-29 ENCOUNTER — Encounter: Payer: Self-pay | Admitting: Family Medicine

## 2018-01-29 ENCOUNTER — Ambulatory Visit (INDEPENDENT_AMBULATORY_CARE_PROVIDER_SITE_OTHER): Payer: Medicare HMO | Admitting: Family Medicine

## 2018-01-29 ENCOUNTER — Other Ambulatory Visit: Payer: Self-pay | Admitting: Cardiology

## 2018-01-29 VITALS — BP 136/56 | HR 65 | Temp 98.3°F | Ht 71.5 in | Wt 253.8 lb

## 2018-01-29 DIAGNOSIS — I10 Essential (primary) hypertension: Secondary | ICD-10-CM

## 2018-01-29 DIAGNOSIS — E039 Hypothyroidism, unspecified: Secondary | ICD-10-CM | POA: Diagnosis not present

## 2018-01-29 DIAGNOSIS — E038 Other specified hypothyroidism: Secondary | ICD-10-CM

## 2018-01-29 DIAGNOSIS — E1165 Type 2 diabetes mellitus with hyperglycemia: Secondary | ICD-10-CM

## 2018-01-29 DIAGNOSIS — Z6835 Body mass index (BMI) 35.0-35.9, adult: Secondary | ICD-10-CM | POA: Diagnosis not present

## 2018-01-29 NOTE — Progress Notes (Signed)
Subjective:    Patient ID: Ryan Garcia, male    DOB: [birth year], 84 y.o.   MRN: [id number]  HPI Here for f/u of chronic health problems   Feeling tired and washed out  Sleeping a lot  Not doing as much  Since his last MI   Thinks he may be depressed (wife says so)- he denies it  Not interested in treatment     Wt Readings from Last 3 Encounters:  01/29/18 253 lb 12 oz (115.1 kg)  12/10/17 250 lb (113.4 kg)  11/22/17 255 lb 8 oz (115.9 kg)   34.90 kg/m   bp is stable today  No cp or palpitations or headaches or edema  No side effects to medicines  BP Readings from Last 3 Encounters:  01/29/18 (!) 136/56  12/10/17 (!) 147/75  11/22/17 140/60     hctz 12.5 Lisinopril 20 Metoprolol 12.5 bid (he thinks this makes him sluggish) Pulse Readings from Last 3 Encounters:  01/29/18 65  12/10/17 (!) 56  11/22/17 76     Lab Results  Component Value Date   CREATININE 1.10 10/15/2017   BUN 19 10/15/2017   NA 140 10/15/2017   K 4.1 10/15/2017   CL 108 10/15/2017   CO2 24 10/15/2017   Lab Results  Component Value Date   ALT 17 10/15/2017   AST 13 10/15/2017   ALKPHOS 77 10/15/2017   BILITOT 0.5 10/15/2017     Subclinical hypothyroid  Lab Results  Component Value Date   TSH 4.70 (H) 01/14/2018   free T4 nl at 0.71 No thyroid problems in the family   DM2 Lab Results  Component Value Date   HGBA1C 8.0 (H) 01/14/2018   This is up from 7.6 He has declined additional medicine  Eye exam 1/19  Foot care  Pravastatin for lipid control Taking metformin  Glipizide 10 mg bid   Elevated blood sugar at home 250s sometimes Rarely above 300  Is not open to changing diet   No symptoms - no thirst/urination/ numbness or blurred vision   Patient Active Problem List   Diagnosis Date Noted  . Bleeding gums 11/22/2017  . Uncontrolled type 2 diabetes mellitus with hyperglycemia ([place]) 10/28/2017  . Subclinical hypothyroidism 10/28/2017  . Gross hematuria  09/23/2017  . Scoliosis   . Obstructive sleep apnea on CPAP   . Hx of skin cancer, basal cell   . HLD (hyperlipidemia)   . GERD (gastroesophageal reflux disease)   . Chronic upper back pain   . Arthritis   . Allergy   . AC (acromioclavicular) joint bone spurs   . NSTEMI (non-ST elevated myocardial infarction) ([place]) 06/18/2017  . Actinic keratoses 04/29/2017  . Positive colorectal cancer screening using Cologuard test 10/31/2016  . BPH (benign prostatic hyperplasia) 09/04/2016  . B12 deficiency 11/22/2015  . Routine general medical examination at a health care facility 08/26/2015  . Abnormal nuclear cardiac imaging test   . Dyspnea on exertion 11/27/2013  . Encounter for examination of normal volunteer in research study 05/29/2013  . Prostate cancer screening 07/15/2012  . CAD S/P percutaneous coronary angioplasty 05/17/2011  . Nonspecific abnormal results of cardiovascular function study 04/30/2011  . Abnormal EKG 04/06/2011  . Left arm pain 04/06/2011  . Right low back pain 01/13/2010  . Insulin dependent diabetes mellitus ([place]) 10/04/2006  . Class 2 severe obesity due to excess calories with serious comorbidity and body mass index (BMI) of 35.0 to 35.9 in adult ([place]) 10/04/2006  .  ERECTILE DYSFUNCTION 10/04/2006  . Essential hypertension 10/04/2006  . ALLERGIC RHINITIS 10/04/2006  . OSTEOARTHRITIS 10/04/2006  . Sleep apnea 10/04/2006  . EDEMA 10/04/2006  . SKIN CANCER, HX OF 10/04/2006   Past Medical History:  Diagnosis Date  . AC (acromioclavicular) joint bone spurs    lt ankle  . Allergy    allergic rhinitis  . Arthritis    OA  . BPH (benign prostatic hyperplasia)    microwave tx of prostate  . CAD (coronary artery disease)    a. s/p cath in 2012 showing 70-75% LAD stenosis, 75% D1 stenosis, 75% OM1, 60-70% RCA stenosis, and 80% PDA --> medical therapy pursued b. similar findings by cath in 2016; 06/2017 DES to distal RCA  . Chronic upper back pain   . Diabetes  mellitus    type II x8 years  . GERD (gastroesophageal reflux disease)   . HLD (hyperlipidemia)    10/12: TC 208, TG 213, HDL 36, LDL 129  . Hx of skin cancer, basal cell    many skin cancers removed/under constant treatment.  . Hypertension   . NSTEMI (non-ST elevated myocardial infarction) ([place]) 06/18/2017   DES to distal RCA  . Obstructive sleep apnea on CPAP   . Scoliosis    Past Surgical History:  Procedure Laterality Date  . BREAST SURGERY  2009   breast lump removed benign  . CARDIAC CATHETERIZATION N/A 03/15/2015   Procedure: Left Heart Cath and Coronary Angiography;  Surgeon: Belva Crome, MD;  Location: [facility];  Service: Cardiovascular;  Laterality: N/A;  . CORONARY STENT INTERVENTION N/A 06/18/2017   Procedure: CORONARY STENT INTERVENTION;  Surgeon: Jettie Booze, MD;  Location: [facility];  Service: Cardiovascular;  Laterality: N/A;  RCA  . LEFT HEART CATH AND CORONARY ANGIOGRAPHY N/A 06/18/2017   Procedure: LEFT HEART CATH AND CORONARY ANGIOGRAPHY;  Surgeon: Jettie Booze, MD;  Location: [facility];  Service: Cardiovascular;  Laterality: N/A;  . ULTRASOUND GUIDANCE FOR VASCULAR ACCESS  06/18/2017   Procedure: Ultrasound Guidance For Vascular Access;  Surgeon: Jettie Booze, MD;  Location: [facility];  Service: Cardiovascular;;  right radial, right femoral   Social History   Tobacco Use  . Smoking status: Never Smoker  . Smokeless tobacco: Never Used  Substance Use Topics  . Alcohol use: No    Alcohol/week: 0.0 oz  . Drug use: No   Family History  Problem Relation Age of Onset  . Heart attack Brother   . Colon cancer Neg Hx    Allergies  Allergen Reactions  . Loratadine     REACTION: not effective  . Simvastatin     REACTION: intolerant   Current Outpatient Medications on File Prior to Visit  Medication Sig Dispense Refill  . aspirin 81 MG tablet Take 81 mg by mouth daily.      . Blood Glucose  Monitoring Suppl (TRUE METRIX AIR GLUCOSE METER) W/DEVICE KIT 1 kit by Other route 2 (two) times daily. Check blood sugar twice daily and as directed. Dx E11.65 1 kit 0  . cetirizine (ZYRTEC) 10 MG tablet TAKE 1 TABLET EVERY DAY 90 tablet 1  . Coenzyme Q10 (CO Q 10 PO) Take 200 mg by mouth daily.    . Cyanocobalamin (VITAMIN B12 PO) Take 1,000 mg by mouth 2 (two) times a week.    . fluorouracil (EFUDEX) 5 % cream Apply 2 application topically daily as needed (skin lesions).     Marland Kitchen  glipiZIDE (GLUCOTROL) 10 MG tablet Take 1 tablet (10 mg total) by mouth 2 (two) times daily with a meal. 180 tablet 3  . glucose blood (TRUE METRIX BLOOD GLUCOSE TEST) test strip Use to check blood sugar twice a day. Dx Code E11.65 300 each 1  . hydrochlorothiazide (HYDRODIURIL) 12.5 MG tablet Take 1 tablet (12.5 mg total) by mouth daily. 90 tablet 3  . Insulin Glargine (LANTUS SOLOSTAR) 100 UNIT/ML Solostar Pen INJECT  45 UNITS SUBCUTANEOUSLY AT BEDTIME 40 mL 3  . Insulin Pen Needle (UNIFINE PENTIPS) 31G X 6 MM MISC USE PEN NEEDLES WHEN TAKING LANTUS AT BEDTIME 100 each 1  . isosorbide mononitrate (IMDUR) 30 MG 24 hr tablet TAKE 1 TABLET EVERY DAY 90 tablet 1  . lisinopril (PRINIVIL,ZESTRIL) 20 MG tablet Take 1 tablet (20 mg total) by mouth daily. 90 tablet 3  . metFORMIN (GLUCOPHAGE) 1000 MG tablet Take 1 tablet (1,000 mg total) by mouth 2 (two) times daily. 180 tablet 3  . metoprolol tartrate (LOPRESSOR) 25 MG tablet TAKE 1/2 TABLET TWICE DAILY 90 tablet 3  . nitroGLYCERIN (NITROSTAT) 0.4 MG SL tablet Place 1 tablet (0.4 mg total) every 5 (five) minutes as needed under the tongue for chest pain. 25 tablet 3  . ticagrelor (BRILINTA) 90 MG TABS tablet Take 1 tablet (90 mg total) by mouth 2 (two) times daily. 180 tablet 3  . triamcinolone (NASACORT) 55 MCG/ACT AERO nasal inhaler USE ONE SPRAYS EACH NOSTRIL EVERY DAY as needed    . TRUEPLUS LANCETS 30G MISC Check blood sugar twice daily and as directed. Dx E11.65 200 each 1    . pravastatin (PRAVACHOL) 80 MG tablet Take 1 tablet (80 mg total) by mouth every evening. 90 tablet 3   No current facility-administered medications on file prior to visit.     Review of Systems  Constitutional: Positive for fatigue. Negative for activity change, appetite change, fever and unexpected weight change.  HENT: Negative for congestion, rhinorrhea, sore throat and trouble swallowing.   Eyes: Negative for pain, redness, itching and visual disturbance.  Respiratory: Negative for cough, chest tightness, shortness of breath and wheezing.   Cardiovascular: Negative for chest pain and palpitations.  Gastrointestinal: Negative for abdominal pain, blood in stool, constipation, diarrhea and nausea.  Endocrine: Negative for cold intolerance, heat intolerance, polydipsia and polyuria.  Genitourinary: Negative for difficulty urinating, dysuria, frequency and urgency.  Musculoskeletal: Positive for back pain. Negative for arthralgias, joint swelling and myalgias.  Skin: Negative for pallor and rash.  Neurological: Negative for dizziness, tremors, weakness, numbness and headaches.  Hematological: Negative for adenopathy. Does not bruise/bleed easily.  Psychiatric/Behavioral: Negative for decreased concentration and dysphoric mood. The patient is not nervous/anxious.        Sluggish and lack of motivation Pt denies depression symptoms (but wife thinks he is depressed)        Objective:   Physical Exam  Constitutional: He appears well-developed and well-nourished. No distress.  obese and well appearing   HENT:  Head: Normocephalic and atraumatic.  Mouth/Throat: Oropharynx is clear and moist.  Eyes: Pupils are equal, round, and reactive to light. Conjunctivae and EOM are normal.  Neck: Normal range of motion. Neck supple. No JVD present. Carotid bruit is not present. No thyromegaly present.  Cardiovascular: Normal rate, regular rhythm, normal heart sounds and intact distal pulses. Exam  reveals no gallop.  Rate in 60s  Pulmonary/Chest: Effort normal and breath sounds normal. No respiratory distress. He has no wheezes. He has no rales.  No crackles  Abdominal: Soft. Bowel sounds are normal. He exhibits no distension, no abdominal bruit and no mass. There is no tenderness.  Musculoskeletal: He exhibits no edema.  Lymphadenopathy:    He has no cervical adenopathy.  Neurological: He is alert. He has normal reflexes. He displays normal reflexes. No cranial nerve deficit. Coordination normal.  Skin: Skin is warm and dry. No rash noted.  Psychiatric: His affect is blunt.  Somewhat blunted affect  Hearing loss also affects communication          Assessment & Plan:   Problem List Items Addressed This Visit      Cardiovascular and Mediastinum   Essential hypertension    bp in fair control at this time  BP Readings from Last 1 Encounters:  01/29/18 (!) 136/56   No changes needed Most recent labs reviewed  Disc lifstyle change with low sodium diet and exercise   Diastolic in 68T- no dizziness  He does feel more sluggish since starting metoprolol- adv him to let cardiology know  Pulse in 60s         Endocrine   Subclinical hypothyroidism    Lab Results  Component Value Date   TSH 4.70 (H) 01/14/2018   slightly elevated Free T4 nl rage at 0.71 Will watch this carefully in light of sluggishness        Uncontrolled type 2 diabetes mellitus with hyperglycemia ([place]) - Primary    Lab Results  Component Value Date   HGBA1C 8.0 (H) 01/14/2018   This is up from 7.6 Pt is aware and declines further treatment or increase in lantus  He is also unwilling to change his diet  Disc this in detail  He still wants to monitor but would not make more changes unless he feels bad  I enc him strongly to call if he changes his mind utd opthy Good foot care Ace for renal protection F/u 3 mo (also due for lipids at that time)        Other   Class 2 severe obesity due to  excess calories with serious comorbidity and body mass index (BMI) of 35.0 to 35.9 in adult [facility])

## 2018-01-29 NOTE — Assessment & Plan Note (Signed)
Lab Results  Component Value Date   TSH 4.70 (H) 01/14/2018   slightly elevated Free T4 nl rage at 0.71 Will watch this carefully in light of sluggishness

## 2018-01-29 NOTE — Assessment & Plan Note (Signed)
bp in fair control at this time  BP Readings from Last 1 Encounters:  01/29/18 (!) 136/56   No changes needed Most recent labs reviewed  Disc lifstyle change with low sodium diet and exercise   Diastolic in 94V- no dizziness  He does feel more sluggish since starting metoprolol- adv him to let cardiology know  Pulse in 60s

## 2018-01-29 NOTE — Telephone Encounter (Signed)
Rx sent to pharmacy   

## 2018-01-29 NOTE — Patient Instructions (Signed)
Please think about changing your diet for diabetes since it is out of control  Also move more -this also helps  Let cardiology know that the metoprolol is making you tired   We will keep watching your thyroid   Follow up in 3 months

## 2018-01-29 NOTE — Assessment & Plan Note (Signed)
Lab Results  Component Value Date   HGBA1C 8.0 (H) 01/14/2018   This is up from 7.6 Pt is aware and declines further treatment or increase in lantus  He is also unwilling to change his diet  Disc this in detail  He still wants to monitor but would not make more changes unless he feels bad  I enc him strongly to call if he changes his mind utd opthy Good foot care Ace for renal protection F/u 3 mo (also due for lipids at that time)

## 2018-02-25 DIAGNOSIS — X32XXXA Exposure to sunlight, initial encounter: Secondary | ICD-10-CM | POA: Diagnosis not present

## 2018-02-25 DIAGNOSIS — C4441 Basal cell carcinoma of skin of scalp and neck: Secondary | ICD-10-CM | POA: Diagnosis not present

## 2018-02-25 DIAGNOSIS — Z09 Encounter for follow-up examination after completed treatment for conditions other than malignant neoplasm: Secondary | ICD-10-CM | POA: Diagnosis not present

## 2018-02-25 DIAGNOSIS — Z872 Personal history of diseases of the skin and subcutaneous tissue: Secondary | ICD-10-CM | POA: Diagnosis not present

## 2018-02-25 DIAGNOSIS — L570 Actinic keratosis: Secondary | ICD-10-CM | POA: Diagnosis not present

## 2018-02-25 DIAGNOSIS — D485 Neoplasm of uncertain behavior of skin: Secondary | ICD-10-CM | POA: Diagnosis not present

## 2018-02-25 DIAGNOSIS — Z08 Encounter for follow-up examination after completed treatment for malignant neoplasm: Secondary | ICD-10-CM | POA: Diagnosis not present

## 2018-02-25 DIAGNOSIS — Z85828 Personal history of other malignant neoplasm of skin: Secondary | ICD-10-CM | POA: Diagnosis not present

## 2018-03-20 NOTE — Progress Notes (Deleted)
HPI: FU CAD.  Cardiac catheterization in the past has revealed significant three-vessel coronary artery disease.  I reviewed those films with Dr. Percival Spanish and Dr. Lia Foyer and we felt medical therapy best option (pt also wanted to avoid CABG). Patient admitted December 2018 and ruled in for non-ST elevation myocardial infarction.  Patient underwent repeat catheterization revealing severe three-vessel coronary disease.  Left-sided disease was unchanged compared to previous catheterizations and the culprit was felt to be a new RCA lesion and he had PCI with DES.  Echocardiogram December 2018 showed normal LV systolic function and mild left atrial enlargement.  Patient had an episode of hematuria 2 days ago.  He was seen by primary care and urology evaluation recommended.  Since he was last seen,   Current Outpatient Medications  Medication Sig Dispense Refill  . aspirin 81 MG tablet Take 81 mg by mouth daily.      . Blood Glucose Monitoring Suppl (TRUE METRIX AIR GLUCOSE METER) W/DEVICE KIT 1 kit by Other route 2 (two) times daily. Check blood sugar twice daily and as directed. Dx E11.65 1 kit 0  . cetirizine (ZYRTEC) 10 MG tablet TAKE 1 TABLET EVERY DAY 90 tablet 1  . Coenzyme Q10 (CO Q 10 PO) Take 200 mg by mouth daily.    . Cyanocobalamin (VITAMIN B12 PO) Take 1,000 mg by mouth 2 (two) times a week.    . fluorouracil (EFUDEX) 5 % cream Apply 2 application topically daily as needed (skin lesions).     Marland Kitchen glipiZIDE (GLUCOTROL) 10 MG tablet Take 1 tablet (10 mg total) by mouth 2 (two) times daily with a meal. 180 tablet 3  . glucose blood (TRUE METRIX BLOOD GLUCOSE TEST) test strip Use to check blood sugar twice a day. Dx Code E11.65 300 each 1  . hydrochlorothiazide (HYDRODIURIL) 12.5 MG tablet Take 1 tablet (12.5 mg total) by mouth daily. 90 tablet 3  . Insulin Glargine (LANTUS SOLOSTAR) 100 UNIT/ML Solostar Pen INJECT  45 UNITS SUBCUTANEOUSLY AT BEDTIME 40 mL 3  . Insulin Pen Needle (UNIFINE  PENTIPS) 31G X 6 MM MISC USE PEN NEEDLES WHEN TAKING LANTUS AT BEDTIME 100 each 1  . isosorbide mononitrate (IMDUR) 30 MG 24 hr tablet TAKE 1 TABLET EVERY DAY 90 tablet 1  . lisinopril (PRINIVIL,ZESTRIL) 20 MG tablet Take 1 tablet (20 mg total) by mouth daily. 90 tablet 3  . metFORMIN (GLUCOPHAGE) 1000 MG tablet Take 1 tablet (1,000 mg total) by mouth 2 (two) times daily. 180 tablet 3  . metoprolol tartrate (LOPRESSOR) 25 MG tablet TAKE 1/2 TABLET TWICE DAILY 90 tablet 3  . nitroGLYCERIN (NITROSTAT) 0.4 MG SL tablet Place 1 tablet (0.4 mg total) every 5 (five) minutes as needed under the tongue for chest pain. 25 tablet 3  . pravastatin (PRAVACHOL) 80 MG tablet TAKE 1 TABLET EVERY EVENING 90 tablet 0  . ticagrelor (BRILINTA) 90 MG TABS tablet Take 1 tablet (90 mg total) by mouth 2 (two) times daily. 180 tablet 3  . triamcinolone (NASACORT) 55 MCG/ACT AERO nasal inhaler USE ONE SPRAYS EACH NOSTRIL EVERY DAY as needed    . TRUEPLUS LANCETS 30G MISC Check blood sugar twice daily and as directed. Dx E11.65 200 each 1   No current facility-administered medications for this visit.      Past Medical History:  Diagnosis Date  . AC (acromioclavicular) joint bone spurs    lt ankle  . Allergy    allergic rhinitis  . Arthritis  OA  . BPH (benign prostatic hyperplasia)    microwave tx of prostate  . CAD (coronary artery disease)    a. s/p cath in 2012 showing 70-75% LAD stenosis, 75% D1 stenosis, 75% OM1, 60-70% RCA stenosis, and 80% PDA --> medical therapy pursued b. similar findings by cath in 2016; 06/2017 DES to distal RCA  . Chronic upper back pain   . Diabetes mellitus    type II x8 years  . GERD (gastroesophageal reflux disease)   . HLD (hyperlipidemia)    10/12: TC 208, TG 213, HDL 36, LDL 129  . Hx of skin cancer, basal cell    many skin cancers removed/under constant treatment.  . Hypertension   . NSTEMI (non-ST elevated myocardial infarction) ([place]) 06/18/2017   DES to distal RCA    . Obstructive sleep apnea on CPAP   . Scoliosis     Past Surgical History:  Procedure Laterality Date  . BREAST SURGERY  2009   breast lump removed benign  . CARDIAC CATHETERIZATION N/A 03/15/2015   Procedure: Left Heart Cath and Coronary Angiography;  Surgeon: Belva Crome, MD;  Location: [facility];  Service: Cardiovascular;  Laterality: N/A;  . CORONARY STENT INTERVENTION N/A 06/18/2017   Procedure: CORONARY STENT INTERVENTION;  Surgeon: Jettie Booze, MD;  Location: [facility];  Service: Cardiovascular;  Laterality: N/A;  RCA  . LEFT HEART CATH AND CORONARY ANGIOGRAPHY N/A 06/18/2017   Procedure: LEFT HEART CATH AND CORONARY ANGIOGRAPHY;  Surgeon: Jettie Booze, MD;  Location: [facility];  Service: Cardiovascular;  Laterality: N/A;  . ULTRASOUND GUIDANCE FOR VASCULAR ACCESS  06/18/2017   Procedure: Ultrasound Guidance For Vascular Access;  Surgeon: Jettie Booze, MD;  Location: [facility];  Service: Cardiovascular;;  right radial, right femoral    Social History   Socioeconomic History  . Marital status: Married    Spouse name: Not on file  . Number of children: Not on file  . Years of education: Not on file  . Highest education level: Not on file  Occupational History  . Not on file  Social Needs  . Financial resource strain: Not on file  . Food insecurity:    Worry: Not on file    Inability: Not on file  . Transportation needs:    Medical: Not on file    Non-medical: Not on file  Tobacco Use  . Smoking status: Never Smoker  . Smokeless tobacco: Never Used  Substance and Sexual Activity  . Alcohol use: No    Alcohol/week: 0.0 standard drinks  . Drug use: No  . Sexual activity: Not Currently  Lifestyle  . Physical activity:    Days per week: Not on file    Minutes per session: Not on file  . Stress: Not on file  Relationships  . Social connections:    Talks on phone: Not on file    Gets together: Not on  file    Attends religious service: Not on file    Active member of club or organization: Not on file    Attends meetings of clubs or organizations: Not on file    Relationship status: Not on file  . Intimate partner violence:    Fear of current or ex partner: Not on file    Emotionally abused: Not on file    Physically abused: Not on file    Forced sexual activity: Not on file  Other Topics Concern  . Not  on file  Social History Narrative   Married    Physically active hard of hearing    Family History  Problem Relation Age of Onset  . Heart attack Brother   . Colon cancer Neg Hx     ROS: no fevers or chills, productive cough, hemoptysis, dysphasia, odynophagia, melena, hematochezia, dysuria, hematuria, rash, seizure activity, orthopnea, PND, pedal edema, claudication. Remaining systems are negative.  Physical Exam: Well-developed well-nourished in no acute distress.  Skin is warm and dry.  HEENT is normal.  Neck is supple.  Chest is clear to auscultation with normal expansion.  Cardiovascular exam is regular rate and rhythm.  Abdominal exam nontender or distended. No masses palpated. Extremities show no edema. neuro grossly intact  ECG- personally reviewed  A/P  1 coronary artery disease-patient is doing well with no chest pain.  Continue medical therapy with aspirin and statin.  Discontinue Brilinta January 2020.  2 hypertension-blood pressure is controlled today.  Continue present medications and follow.  3 hyperlipidemia-plan to continue pravastatin.  He did not tolerate Lipitor in the past.  He states Crestor was too expensive.  Check lipids and liver.  4 dyspnea-this is felt to be multifactorial including obesity and deconditioning.  Kirk Ruths, MD

## 2018-03-24 ENCOUNTER — Encounter: Payer: Self-pay | Admitting: *Deleted

## 2018-03-24 DIAGNOSIS — Z006 Encounter for examination for normal comparison and control in clinical research program: Secondary | ICD-10-CM

## 2018-03-27 ENCOUNTER — Ambulatory Visit: Payer: Medicare HMO | Admitting: Cardiology

## 2018-03-28 ENCOUNTER — Encounter: Payer: Self-pay | Admitting: *Deleted

## 2018-03-31 ENCOUNTER — Ambulatory Visit: Payer: Medicare HMO | Admitting: Cardiology

## 2018-03-31 ENCOUNTER — Encounter: Payer: Self-pay | Admitting: Cardiology

## 2018-03-31 VITALS — Ht 72.0 in | Wt 252.0 lb

## 2018-03-31 DIAGNOSIS — E785 Hyperlipidemia, unspecified: Secondary | ICD-10-CM | POA: Diagnosis not present

## 2018-03-31 DIAGNOSIS — I10 Essential (primary) hypertension: Secondary | ICD-10-CM | POA: Diagnosis not present

## 2018-03-31 DIAGNOSIS — H833X3 Noise effects on inner ear, bilateral: Secondary | ICD-10-CM

## 2018-03-31 DIAGNOSIS — Z9861 Coronary angioplasty status: Secondary | ICD-10-CM

## 2018-03-31 DIAGNOSIS — IMO0001 Reserved for inherently not codable concepts without codable children: Secondary | ICD-10-CM

## 2018-03-31 DIAGNOSIS — E119 Type 2 diabetes mellitus without complications: Secondary | ICD-10-CM

## 2018-03-31 DIAGNOSIS — I2510 Atherosclerotic heart disease of native coronary artery without angina pectoris: Secondary | ICD-10-CM | POA: Diagnosis not present

## 2018-03-31 DIAGNOSIS — Z794 Long term (current) use of insulin: Secondary | ICD-10-CM | POA: Diagnosis not present

## 2018-03-31 DIAGNOSIS — H9190 Unspecified hearing loss, unspecified ear: Secondary | ICD-10-CM | POA: Insufficient documentation

## 2018-03-31 NOTE — Assessment & Plan Note (Signed)
Intolerant to Lipitor- changed to high dose Crestor after his NSTEMI Dec 2018, then Pravachol. Last LDL was 69 April 2019

## 2018-03-31 NOTE — Patient Instructions (Addendum)
Medication Instructions:   Your physician recommends that you continue on your current medications as directed. Please refer to the Current Medication list given to you today.  Labwork:  NONE  Testing/Procedures:  NONE  Follow-Up:  Your physician recommends that you schedule a follow-up appointment in: 6 months with Dr. Stanford Breed. You will receive a reminder letter in the mail in about 4 months reminding you to call and schedule your appointment. If you don't receive this letter, please contact our office.  Any Other Special Instructions Will Be Listed Below (If Applicable).  Handicap applications filled out at this visit.  If you need a refill on your cardiac medications before your next appointment, please call your pharmacy.

## 2018-03-31 NOTE — Assessment & Plan Note (Signed)
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

## 2018-03-31 NOTE — Assessment & Plan Note (Signed)
Followed by PCP

## 2018-03-31 NOTE — Progress Notes (Signed)
03/31/2018 Ryan Garcia   April 09, 1934  [id number]  Primary Physician Tower, Wynelle Fanny, MD Primary Cardiologist: Dr Stanford Breed  HPI:  84 y/o male followed by Dr Stanford Breed with a history of CAD. He has had multivessel disease documented at cath in 2012 and again in 2016. He has expressed his desire to avoid CABG and had done well on medical Rx until he presented 06/17/17 with a NSTEMI. Cath was done and he underwent dRCA PCI with DES. He has extensive residual left sytem disease. His LVF by echo in Dec was normal.   He is in the office today for a 6 month check up. His wife accompanied him. The pt says he's doing fine, his wife says she has noticed increased DOE. He denies any chest pain, he did have "squeezing chest pain prior to his stent in Dec 2018 and he has not had recurrence of those symptoms. He did finally admit to exertional dyspnea. He says he is not ably to "chop wood" like he used to. His wife asked for a handicapped parking sticker which I provided.    Current Outpatient Medications  Medication Sig Dispense Refill  . aspirin 81 MG tablet Take 81 mg by mouth daily.      . Blood Glucose Monitoring Suppl (TRUE METRIX AIR GLUCOSE METER) W/DEVICE KIT 1 kit by Other route 2 (two) times daily. Check blood sugar twice daily and as directed. Dx E11.65 1 kit 0  . cetirizine (ZYRTEC) 10 MG tablet TAKE 1 TABLET EVERY DAY 90 tablet 1  . Coenzyme Q10 (CO Q 10 PO) Take 200 mg by mouth daily.    . Cyanocobalamin (VITAMIN B12 PO) Take 1,000 mg by mouth 2 (two) times a week.    . fluorouracil (EFUDEX) 5 % cream Apply 2 application topically daily as needed (skin lesions).     Marland Kitchen glipiZIDE (GLUCOTROL) 10 MG tablet Take 1 tablet (10 mg total) by mouth 2 (two) times daily with a meal. 180 tablet 3  . glucose blood (TRUE METRIX BLOOD GLUCOSE TEST) test strip Use to check blood sugar twice a day. Dx Code E11.65 300 each 1  . hydrochlorothiazide (HYDRODIURIL) 12.5 MG tablet Take 1 tablet (12.5 mg total) by  mouth daily. 90 tablet 3  . Insulin Glargine (LANTUS SOLOSTAR) 100 UNIT/ML Solostar Pen INJECT  45 UNITS SUBCUTANEOUSLY AT BEDTIME 40 mL 3  . Insulin Pen Needle (UNIFINE PENTIPS) 31G X 6 MM MISC USE PEN NEEDLES WHEN TAKING LANTUS AT BEDTIME 100 each 1  . isosorbide mononitrate (IMDUR) 30 MG 24 hr tablet TAKE 1 TABLET EVERY DAY 90 tablet 1  . lisinopril (PRINIVIL,ZESTRIL) 20 MG tablet Take 1 tablet (20 mg total) by mouth daily. 90 tablet 3  . metFORMIN (GLUCOPHAGE) 1000 MG tablet Take 1 tablet (1,000 mg total) by mouth 2 (two) times daily. 180 tablet 3  . metoprolol tartrate (LOPRESSOR) 25 MG tablet TAKE 1/2 TABLET TWICE DAILY 90 tablet 3  . nitroGLYCERIN (NITROSTAT) 0.4 MG SL tablet Place 1 tablet (0.4 mg total) every 5 (five) minutes as needed under the tongue for chest pain. 25 tablet 3  . pravastatin (PRAVACHOL) 80 MG tablet TAKE 1 TABLET EVERY EVENING 90 tablet 0  . ticagrelor (BRILINTA) 90 MG TABS tablet Take 1 tablet (90 mg total) by mouth 2 (two) times daily. 180 tablet 3  . triamcinolone (NASACORT) 55 MCG/ACT AERO nasal inhaler USE ONE SPRAYS EACH NOSTRIL EVERY DAY as needed    . TRUEPLUS LANCETS 30G MISC  Check blood sugar twice daily and as directed. Dx E11.65 200 each 1   No current facility-administered medications for this visit.     Allergies  Allergen Reactions  . Loratadine     REACTION: not effective  . Simvastatin     REACTION: intolerant    Past Medical History:  Diagnosis Date  . AC (acromioclavicular) joint bone spurs    lt ankle  . Allergy    allergic rhinitis  . Arthritis    OA  . BPH (benign prostatic hyperplasia)    microwave tx of prostate  . CAD (coronary artery disease)    a. s/p cath in 2012 showing 70-75% LAD stenosis, 75% D1 stenosis, 75% OM1, 60-70% RCA stenosis, and 80% PDA --> medical therapy pursued b. similar findings by cath in 2016; 06/2017 DES to distal RCA  . Chronic upper back pain   . Diabetes mellitus    type II x8 years  . GERD  (gastroesophageal reflux disease)   . HLD (hyperlipidemia)    10/12: TC 208, TG 213, HDL 36, LDL 129  . Hx of skin cancer, basal cell    many skin cancers removed/under constant treatment.  . Hypertension   . NSTEMI (non-ST elevated myocardial infarction) ([place]) 06/18/2017   DES to distal RCA  . Obstructive sleep apnea on CPAP   . Scoliosis     Social History   Socioeconomic History  . Marital status: Married    Spouse name: Not on file  . Number of children: Not on file  . Years of education: Not on file  . Highest education level: Not on file  Occupational History  . Not on file  Social Needs  . Financial resource strain: Not on file  . Food insecurity:    Worry: Not on file    Inability: Not on file  . Transportation needs:    Medical: Not on file    Non-medical: Not on file  Tobacco Use  . Smoking status: Never Smoker  . Smokeless tobacco: Never Used  Substance and Sexual Activity  . Alcohol use: No    Alcohol/week: 0.0 standard drinks  . Drug use: No  . Sexual activity: Not Currently  Lifestyle  . Physical activity:    Days per week: Not on file    Minutes per session: Not on file  . Stress: Not on file  Relationships  . Social connections:    Talks on phone: Not on file    Gets together: Not on file    Attends religious service: Not on file    Active member of club or organization: Not on file    Attends meetings of clubs or organizations: Not on file    Relationship status: Not on file  . Intimate partner violence:    Fear of current or ex partner: Not on file    Emotionally abused: Not on file    Physically abused: Not on file    Forced sexual activity: Not on file  Other Topics Concern  . Not on file  Social History Narrative   Married    Physically active hard of hearing     Family History  Problem Relation Age of Onset  . Heart attack Brother   . Colon cancer Neg Hx      Review of Systems: General: negative for chills, fever, night  sweats or weight changes.  Cardiovascular: negative for chest pain, edema, orthopnea, palpitations, paroxysmal nocturnal dyspnea or shortness of breath Dermatological: negative for rash  Respiratory: negative for cough or wheezing Urologic: negative for hematuria Abdominal: negative for nausea, vomiting, diarrhea, bright red blood per rectum, melena, or hematemesis Neurologic: negative for visual changes, syncope, or dizziness HOH-bilateral hearing aids All other systems reviewed and are otherwise negative except as noted above.    Height 6' (1.829 m), weight 252 lb (114.3 kg).  General appearance: alert, cooperative and no distress Neck: no carotid bruit and no JVD Lungs: clear to auscultation bilaterally Heart: regular rate and rhythm Extremities: no edema Skin: Skin color, texture, turgor normal. No rashes or lesions Neurologic: Grossly normal  EKG NSR, sinus arrhythmia 61  ASSESSMENT AND PLAN:   CAD S/P percutaneous coronary angioplasty 3 V CAD in 2012-2016. Pt elected medical Rx and did well until 06/17/17- dRCA PCI with DES  Essential hypertension Repeat B/P by me 128/70 Mild LVH on echo dec 2018  Dyslipidemia, goal LDL below 70 Intolerant to Lipitor- changed to high dose Crestor after his NSTEMI Dec 2018, then Pravachol. 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 He's pretty stoic-he says he fine, his wife disagrees. I reassured the patient that we have options for medical Rx if he develops symptoms of angina, either chest pain or increased DOE. He assures me that he is comfortable with his current level of activity. F/U Dr Stanford Breed in 6 months.   Kerin Ransom PA-C 03/31/2018 8:43 AM

## 2018-03-31 NOTE — Assessment & Plan Note (Signed)
3 V CAD in 2012-2016. Pt elected medical Rx and did well until 06/17/17- dRCA PCI with DES

## 2018-03-31 NOTE — Assessment & Plan Note (Signed)
Repeat B/P by me 128/70 Mild LVH on echo dec 2018

## 2018-04-21 DIAGNOSIS — C4441 Basal cell carcinoma of skin of scalp and neck: Secondary | ICD-10-CM | POA: Diagnosis not present

## 2018-04-25 ENCOUNTER — Other Ambulatory Visit: Payer: Self-pay | Admitting: Cardiology

## 2018-04-28 ENCOUNTER — Other Ambulatory Visit (INDEPENDENT_AMBULATORY_CARE_PROVIDER_SITE_OTHER): Payer: Medicare HMO

## 2018-04-28 DIAGNOSIS — I10 Essential (primary) hypertension: Secondary | ICD-10-CM

## 2018-04-28 DIAGNOSIS — E1165 Type 2 diabetes mellitus with hyperglycemia: Secondary | ICD-10-CM

## 2018-04-28 LAB — COMPREHENSIVE METABOLIC PANEL
ALT: 21 U/L (ref 0–53)
AST: 12 U/L (ref 0–37)
Albumin: 4 g/dL (ref 3.5–5.2)
Alkaline Phosphatase: 77 U/L (ref 39–117)
BUN: 20 mg/dL (ref 6–23)
CHLORIDE: 108 meq/L (ref 96–112)
CO2: 24 meq/L (ref 19–32)
Calcium: 9 mg/dL (ref 8.4–10.5)
Creatinine, Ser: 1.22 mg/dL (ref 0.40–1.50)
GFR: 60.42 mL/min (ref >60.00)
Glucose, Bld: 172 mg/dL — ABNORMAL HIGH (ref 70–99)
POTASSIUM: 4.1 meq/L (ref 3.5–5.1)
SODIUM: 140 meq/L (ref 135–145)
Total Bilirubin: 0.6 mg/dL (ref 0.2–1.2)
Total Protein: 6.6 g/dL (ref 6.0–8.3)

## 2018-04-28 LAB — LIPID PANEL
Cholesterol: 116 mg/dL (ref 0–200)
HDL: 32.3 mg/dL — ABNORMAL LOW (ref >39.00)
LDL CALC: 59 mg/dL (ref 0–99)
NONHDL: 83.9
Total CHOL/HDL Ratio: 4
Triglycerides: 124 mg/dL (ref 0.0–149.0)
VLDL: 24.8 mg/dL (ref 0.0–40.0)

## 2018-04-28 LAB — HEMOGLOBIN A1C: Hgb A1c MFr Bld: 7.2 % — ABNORMAL HIGH (ref 4.6–6.5)

## 2018-05-01 ENCOUNTER — Encounter: Payer: Self-pay | Admitting: Family Medicine

## 2018-05-01 ENCOUNTER — Ambulatory Visit (INDEPENDENT_AMBULATORY_CARE_PROVIDER_SITE_OTHER): Payer: Medicare HMO | Admitting: Family Medicine

## 2018-05-01 VITALS — BP 130/76 | HR 62 | Temp 98.3°F | Ht 72.0 in | Wt 249.5 lb

## 2018-05-01 DIAGNOSIS — E1165 Type 2 diabetes mellitus with hyperglycemia: Secondary | ICD-10-CM | POA: Diagnosis not present

## 2018-05-01 DIAGNOSIS — Z23 Encounter for immunization: Secondary | ICD-10-CM

## 2018-05-01 DIAGNOSIS — Z6835 Body mass index (BMI) 35.0-35.9, adult: Secondary | ICD-10-CM

## 2018-05-01 DIAGNOSIS — E785 Hyperlipidemia, unspecified: Secondary | ICD-10-CM

## 2018-05-01 DIAGNOSIS — I10 Essential (primary) hypertension: Secondary | ICD-10-CM | POA: Diagnosis not present

## 2018-05-01 NOTE — Assessment & Plan Note (Signed)
Lab Results  Component Value Date   HGBA1C 7.2 (H) 04/28/2018   Improved with less sweets and smaller portion sizes  Enc him to keep it up  Eye exam utd [facility]

## 2018-05-01 NOTE — Assessment & Plan Note (Signed)
bp in fair control at this time  BP Readings from Last 1 Encounters:  05/01/18 130/76   No changes needed Most recent labs reviewed  Disc lifstyle change with low sodium diet and exercise  Rev recent f/u with cardiology

## 2018-05-01 NOTE — Progress Notes (Signed)
Subjective:    Patient ID: Ryan Garcia, male    DOB: December 15, 1933, 84 y.o.   MRN: [id number]  HPI Here for f/u of chronic health problems   Feeling ok  Getting old  No change  Wt Readings from Last 3 Encounters:  05/01/18 249 lb 8 oz (113.2 kg)  03/31/18 252 lb (114.3 kg)  01/29/18 253 lb 12 oz (115.1 kg)  wt is down  33.84 kg/m   bp is stable today  No cp or palpitations or headaches or edema  No side effects to medicines  BP Readings from Last 3 Encounters:  05/01/18 130/76  01/29/18 (!) 136/56  12/10/17 (!) 147/75       DM2 Lab Results  Component Value Date   HGBA1C 7.2 (H) 04/28/2018  DOWN FROM 8.0 Not eating sweets Eating less as well  opthy 1/19  One reading at 75 -not symptomatic  172 when we checked it  Was fasting   Hyperlipidemia Lab Results  Component Value Date   CHOL 116 04/28/2018   CHOL 122 10/15/2017   CHOL 127 08/08/2017   Lab Results  Component Value Date   HDL 32.30 (L) 04/28/2018   HDL 34.20 (L) 10/15/2017   HDL 37 (L) 08/08/2017   Lab Results  Component Value Date   LDLCALC 59 04/28/2018   LDLCALC 69 10/15/2017   LDLCALC 74 08/08/2017   Lab Results  Component Value Date   TRIG 124.0 04/28/2018   TRIG 96.0 10/15/2017   TRIG 81 08/08/2017   Lab Results  Component Value Date   CHOLHDL 4 04/28/2018   CHOLHDL 4 10/15/2017   CHOLHDL 3.4 08/08/2017   Lab Results  Component Value Date   LDLDIRECT 78.5 11/17/2012   LDLDIRECT 156.5 05/09/2010   LDLDIRECT 152.9 08/09/2009  pravastatin and diet    Patient Active Problem List   Diagnosis Date Noted  . HOH (hard of hearing) 03/31/2018  . Bleeding gums 11/22/2017  . Uncontrolled type 2 diabetes mellitus with hyperglycemia ([place]) 10/28/2017  . Subclinical hypothyroidism 10/28/2017  . Gross hematuria 09/23/2017  . Scoliosis   . Obstructive sleep apnea on CPAP   . Hx of skin cancer, basal cell   . Dyslipidemia, goal LDL below 70   . GERD (gastroesophageal reflux disease)     . Chronic upper back pain   . Arthritis   . Allergy   . AC (acromioclavicular) joint bone spurs   . NSTEMI (non-ST elevated myocardial infarction) ([place]) 06/18/2017  . Actinic keratoses 04/29/2017  . Positive colorectal cancer screening using Cologuard test 10/31/2016  . BPH (benign prostatic hyperplasia) 09/04/2016  . B12 deficiency 11/22/2015  . Routine general medical examination at a health care facility 08/26/2015  . Abnormal nuclear cardiac imaging test   . Dyspnea on exertion 11/27/2013  . Encounter for examination of normal volunteer in research study 05/29/2013  . Prostate cancer screening 07/15/2012  . CAD S/P percutaneous coronary angioplasty 05/17/2011  . Nonspecific abnormal results of cardiovascular function study 04/30/2011  . Abnormal EKG 04/06/2011  . Left arm pain 04/06/2011  . Right low back pain 01/13/2010  . Insulin dependent diabetes mellitus ([place]) 10/04/2006  . Class 2 severe obesity due to excess calories with serious comorbidity and body mass index (BMI) of 35.0 to 35.9 in adult ([place]) 10/04/2006  . ERECTILE DYSFUNCTION 10/04/2006  . Essential hypertension 10/04/2006  . ALLERGIC RHINITIS 10/04/2006  . OSTEOARTHRITIS 10/04/2006  . Sleep apnea 10/04/2006  . EDEMA 10/04/2006  . SKIN CANCER, HX  OF 10/04/2006   Past Medical History:  Diagnosis Date  . AC (acromioclavicular) joint bone spurs    lt ankle  . Allergy    allergic rhinitis  . Arthritis    OA  . BPH (benign prostatic hyperplasia)    microwave tx of prostate  . CAD (coronary artery disease)    a. s/p cath in 2012 showing 70-75% LAD stenosis, 75% D1 stenosis, 75% OM1, 60-70% RCA stenosis, and 80% PDA --> medical therapy pursued b. similar findings by cath in 2016; 06/2017 DES to distal RCA  . Chronic upper back pain   . Diabetes mellitus    type II x8 years  . GERD (gastroesophageal reflux disease)   . HLD (hyperlipidemia)    10/12: TC 208, TG 213, HDL 36, LDL 129  . Hx of skin cancer, basal  cell    many skin cancers removed/under constant treatment.  . Hypertension   . NSTEMI (non-ST elevated myocardial infarction) ([place]) 06/18/2017   DES to distal RCA  . Obstructive sleep apnea on CPAP   . Scoliosis    Past Surgical History:  Procedure Laterality Date  . BREAST SURGERY  2009   breast lump removed benign  . CARDIAC CATHETERIZATION N/A 03/15/2015   Procedure: Left Heart Cath and Coronary Angiography;  Surgeon: Belva Crome, MD;  Location: [facility];  Service: Cardiovascular;  Laterality: N/A;  . CORONARY STENT INTERVENTION N/A 06/18/2017   Procedure: CORONARY STENT INTERVENTION;  Surgeon: Jettie Booze, MD;  Location: [facility];  Service: Cardiovascular;  Laterality: N/A;  RCA  . LEFT HEART CATH AND CORONARY ANGIOGRAPHY N/A 06/18/2017   Procedure: LEFT HEART CATH AND CORONARY ANGIOGRAPHY;  Surgeon: Jettie Booze, MD;  Location: [facility];  Service: Cardiovascular;  Laterality: N/A;  . ULTRASOUND GUIDANCE FOR VASCULAR ACCESS  06/18/2017   Procedure: Ultrasound Guidance For Vascular Access;  Surgeon: Jettie Booze, MD;  Location: [facility];  Service: Cardiovascular;;  right radial, right femoral   Social History   Tobacco Use  . Smoking status: Never Smoker  . Smokeless tobacco: Never Used  Substance Use Topics  . Alcohol use: No    Alcohol/week: 0.0 standard drinks  . Drug use: No   Family History  Problem Relation Age of Onset  . Heart attack Brother   . Colon cancer Neg Hx    Allergies  Allergen Reactions  . Loratadine     REACTION: not effective  . Simvastatin     REACTION: intolerant   Current Outpatient Medications on File Prior to Visit  Medication Sig Dispense Refill  . aspirin 81 MG tablet Take 81 mg by mouth daily.      . Blood Glucose Monitoring Suppl (TRUE METRIX AIR GLUCOSE METER) W/DEVICE KIT 1 kit by Other route 2 (two) times daily. Check blood sugar twice daily and as directed. Dx E11.65 1  kit 0  . cetirizine (ZYRTEC) 10 MG tablet TAKE 1 TABLET EVERY DAY 90 tablet 1  . Coenzyme Q10 (CO Q 10 PO) Take 200 mg by mouth daily.    . Cyanocobalamin (VITAMIN B12 PO) Take 1,000 mg by mouth 2 (two) times a week.    . fluorouracil (EFUDEX) 5 % cream Apply 2 application topically daily as needed (skin lesions).     Marland Kitchen glipiZIDE (GLUCOTROL) 10 MG tablet Take 1 tablet (10 mg total) by mouth 2 (two) times daily with a meal. 180 tablet 3  . glucose blood (TRUE  METRIX BLOOD GLUCOSE TEST) test strip Use to check blood sugar twice a day. Dx Code E11.65 300 each 1  . hydrochlorothiazide (HYDRODIURIL) 12.5 MG tablet Take 1 tablet (12.5 mg total) by mouth daily. 90 tablet 3  . Insulin Glargine (LANTUS SOLOSTAR) 100 UNIT/ML Solostar Pen INJECT  45 UNITS SUBCUTANEOUSLY AT BEDTIME 40 mL 3  . Insulin Pen Needle (UNIFINE PENTIPS) 31G X 6 MM MISC USE PEN NEEDLES WHEN TAKING LANTUS AT BEDTIME 100 each 1  . isosorbide mononitrate (IMDUR) 30 MG 24 hr tablet TAKE 1 TABLET EVERY DAY 90 tablet 1  . lisinopril (PRINIVIL,ZESTRIL) 20 MG tablet Take 1 tablet (20 mg total) by mouth daily. 90 tablet 3  . metFORMIN (GLUCOPHAGE) 1000 MG tablet Take 1 tablet (1,000 mg total) by mouth 2 (two) times daily. 180 tablet 3  . metoprolol tartrate (LOPRESSOR) 25 MG tablet TAKE 1/2 TABLET TWICE DAILY 90 tablet 3  . nitroGLYCERIN (NITROSTAT) 0.4 MG SL tablet Place 1 tablet (0.4 mg total) every 5 (five) minutes as needed under the tongue for chest pain. 25 tablet 3  . pravastatin (PRAVACHOL) 80 MG tablet TAKE 1 TABLET EVERY EVENING 90 tablet 1  . ticagrelor (BRILINTA) 90 MG TABS tablet Take 1 tablet (90 mg total) by mouth 2 (two) times daily. 180 tablet 3  . triamcinolone (NASACORT) 55 MCG/ACT AERO nasal inhaler USE ONE SPRAYS EACH NOSTRIL EVERY DAY as needed    . TRUEPLUS LANCETS 30G MISC Check blood sugar twice daily and as directed. Dx E11.65 200 each 1   No current facility-administered medications on file prior to visit.       Review of Systems  Constitutional: Positive for fatigue. Negative for activity change, appetite change, fever and unexpected weight change.  HENT: Positive for hearing loss. Negative for congestion, rhinorrhea, sore throat and trouble swallowing.        Very poor hearing even with hearing aides  Eyes: Negative for pain, redness, itching and visual disturbance.  Respiratory: Positive for shortness of breath. Negative for cough, chest tightness and wheezing.   Cardiovascular: Positive for leg swelling. Negative for chest pain and palpitations.       Baseline sob on exertion   Slight ankle swelling  Gastrointestinal: Negative for abdominal pain, blood in stool, constipation, diarrhea and nausea.  Endocrine: Negative for cold intolerance, heat intolerance, polydipsia and polyuria.  Genitourinary: Negative for difficulty urinating, dysuria, frequency and urgency.  Musculoskeletal: Negative for arthralgias, joint swelling and myalgias.  Skin: Negative for pallor and rash.  Neurological: Negative for dizziness, tremors, weakness, numbness and headaches.  Hematological: Negative for adenopathy. Does not bruise/bleed easily.  Psychiatric/Behavioral: Negative for decreased concentration and dysphoric mood. The patient is not nervous/anxious.        Objective:   Physical Exam  Constitutional: He appears well-developed and well-nourished. No distress.  obese and well appearing   HENT:  Head: Normocephalic and atraumatic.  Mouth/Throat: Oropharynx is clear and moist.  Very HOH    Eyes: Pupils are equal, round, and reactive to light. Conjunctivae and EOM are normal.  Neck: Normal range of motion. Neck supple. No JVD present. Carotid bruit is not present. No thyromegaly present.  Cardiovascular: Normal rate, regular rhythm, normal heart sounds and intact distal pulses. Exam reveals no gallop.  Pulmonary/Chest: Effort normal and breath sounds normal. No respiratory distress. He has no wheezes. He  has no rales.  No crackles  Abdominal: Soft. Bowel sounds are normal. He exhibits no distension, no abdominal bruit and no mass.  There is no tenderness.  Musculoskeletal: He exhibits edema.  Mild ankle puffiness-non pitting   Lymphadenopathy:    He has no cervical adenopathy.  Neurological: He is alert. He has normal reflexes. He displays normal reflexes. No cranial nerve deficit. Coordination normal.  Skin: Skin is warm and dry. No rash noted. No erythema. No pallor.  Psychiatric: He has a normal mood and affect.          Assessment & Plan:   Problem List Items Addressed This Visit      Cardiovascular and Mediastinum   Essential hypertension    bp in fair control at this time  BP Readings from Last 1 Encounters:  05/01/18 130/76   No changes needed Most recent labs reviewed  Disc lifstyle change with low sodium diet and exercise  Rev recent f/u with cardiology        Endocrine   Uncontrolled type 2 diabetes mellitus with hyperglycemia ([place]) - Primary    Lab Results  Component Value Date   HGBA1C 7.2 (H) 04/28/2018   Improved with less sweets and smaller portion sizes  Enc him to keep it up  Eye exam utd [facility]         Other   Class 2 severe obesity due to excess calories with serious comorbidity and body mass index (BMI) of 35.0 to 35.9 in adult [facility])    Discussed how this problem influences overall health and the risks it imposes  Reviewed plan for weight loss with lower calorie diet (via better food choices and also portion control or program like weight watchers) and exercise building up to or more than 30 minutes 5 days per week including some aerobic activity   Exercise is limited due to cardiac status       Dyslipidemia, goal LDL below 70    Disc goals for lipids and reasons to control them Rev last labs with pt Rev low sat fat diet in detail LDL down to 31  HDl still low In setting of CAD Continue pravastatin        Other Visit  Diagnoses    Need for influenza vaccination       Relevant Orders   Flu Vaccine QUAD 6+ mos PF IM (Fluarix Quad PF) (Completed)

## 2018-05-01 NOTE — Assessment & Plan Note (Signed)
Disc goals for lipids and reasons to control them Rev last labs with pt Rev low sat fat diet in detail LDL down to 59  HDl still low In setting of CAD Continue pravastatin

## 2018-05-01 NOTE — Patient Instructions (Signed)
Try to keep up the better diet !  Stay as active as you can  A1C is down as well as blood pressure and cholesterol   Take care of yourself   We will see you in the spring

## 2018-05-01 NOTE — Assessment & Plan Note (Signed)
Discussed how this problem influences overall health and the risks it imposes  Reviewed plan for weight loss with lower calorie diet (via better food choices and also portion control or program like weight watchers) and exercise building up to or more than 30 minutes 5 days per week including some aerobic activity   Exercise is limited due to cardiac status

## 2018-05-02 ENCOUNTER — Ambulatory Visit: Payer: Medicare HMO | Admitting: Family Medicine

## 2018-05-08 ENCOUNTER — Other Ambulatory Visit: Payer: Self-pay | Admitting: Student

## 2018-05-08 ENCOUNTER — Other Ambulatory Visit: Payer: Self-pay | Admitting: Family Medicine

## 2018-05-08 NOTE — Telephone Encounter (Signed)
This is Dr. Crenshaw's pt 

## 2018-05-28 NOTE — Progress Notes (Signed)
[facility] Consultation      Assessment and Plan:  Obstructive sleep apnea. -Long history of obstructive sleep apnea.  Review of most recent download shows slightly elevated residual AHI of 13, noted to have mildly excessive leaks. - Recommended that he start wearing his mask tighter, if not leaks continue may need to refer to mask fitting clinic.   Evidence mellitus, essential hypertension. -Obstructive sleep apnea can contribute to elevated sleep apnea, and blood glucose levels, therefore, it would be important to continue to treat the patient's sleep apnea.   Date: 05/28/2018  MRN# [id number] Ryan Garcia Aug 29, 1933    Ryan Garcia is a 84 y.o. old male seen in consultation for chief complaint of:    Chief Complaint  Patient presents with  . Follow-up    [facility]; DME [facility] Pt wears CPAP nightly and DL attached.     HPI:  The patient is a 84 year old male with obstructive sleep apnea.  At last visit he was asked to continue CPAP with auto set pressure 8-16 cm H2O.  Review of low data shows borderline control of sleep apnea with excellent compliance.  Leaks are mildly elevated.  He is using it every night but notes that he is wearing fairly loosely and does have some leaks. He does not like the imprint it leaves on his face so he was wearing it loosely. Wife notes that there is a loud leak at night.    **CPAP download data 04/20/2018-05/27/2018>> raw data personally reviewed, uses greater than 4 hours is 30/30 days.  Average usage on days used is 7 hours 40 minutes.  Pressure ranges 8-16.  Median pressure 11, 95th percentile pressure of 13, maximum pressure 14.  Leaks are mildly elevated with 95th percentile leak of 46.7.  Residual AHI is 13.7 a central apnea index of 3.  Overall this shows excellent compliance with CPAP with adequate, though not ideal, control of obstructive sleep apnea with mildly elevated leak. **Echo 06/19/2017>> EF 60%.  Pulmonary artery  systolic pressure reported as within the normal range. **Download data 30 days as of 05/27/17; usage greater than 4 hours is 30/30.  Average usage on days used 8 hours 9 minutes.  AutoSet 5-20.  Median pressure is 12, 95th percentile pressure is 13, maximum pressure is 14.4.  Current Meds  Medication Sig  . aspirin 81 MG tablet Take 81 mg by mouth daily.    . Blood Glucose Monitoring Suppl (TRUE METRIX AIR GLUCOSE METER) W/DEVICE KIT 1 kit by Other route 2 (two) times daily. Check blood sugar twice daily and as directed. Dx E11.65  . cetirizine (ZYRTEC) 10 MG tablet TAKE 1 TABLET EVERY DAY  . Coenzyme Q10 (CO Q 10 PO) Take 200 mg by mouth daily.  . Cyanocobalamin (VITAMIN B12 PO) Take 1,000 mg by mouth 2 (two) times a week.  . fluorouracil (EFUDEX) 5 % cream Apply 2 application topically daily as needed (skin lesions).   Marland Kitchen glipiZIDE (GLUCOTROL) 10 MG tablet Take 1 tablet (10 mg total) by mouth 2 (two) times daily with a meal.  . glucose blood (TRUE METRIX BLOOD GLUCOSE TEST) test strip Use to check blood sugar twice a day. Dx Code E11.65  . hydrochlorothiazide (HYDRODIURIL) 12.5 MG tablet Take 1 tablet (12.5 mg total) by mouth daily.  . Insulin Glargine (LANTUS SOLOSTAR) 100 UNIT/ML Solostar Pen INJECT  45 UNITS SUBCUTANEOUSLY AT BEDTIME  . Insulin Pen Needle (UNIFINE PENTIPS) 31G X 6 MM MISC USE PEN NEEDLES WHEN  TAKING LANTUS AT BEDTIME  . isosorbide mononitrate (IMDUR) 30 MG 24 hr tablet TAKE 1 TABLET EVERY DAY  . lisinopril (PRINIVIL,ZESTRIL) 20 MG tablet Take 1 tablet (20 mg total) by mouth daily.  . metFORMIN (GLUCOPHAGE) 1000 MG tablet Take 1 tablet (1,000 mg total) by mouth 2 (two) times daily.  . metoprolol tartrate (LOPRESSOR) 25 MG tablet TAKE 1/2 TABLET TWICE DAILY  . nitroGLYCERIN (NITROSTAT) 0.4 MG SL tablet Place 1 tablet (0.4 mg total) every 5 (five) minutes as needed under the tongue for chest pain.  . pravastatin (PRAVACHOL) 80 MG tablet TAKE 1 TABLET EVERY EVENING  .  ticagrelor (BRILINTA) 90 MG TABS tablet Take 1 tablet (90 mg total) by mouth 2 (two) times daily.  Marland Kitchen triamcinolone (NASACORT) 55 MCG/ACT AERO nasal inhaler USE ONE SPRAYS EACH NOSTRIL EVERY DAY as needed  . TRUEPLUS LANCETS 30G MISC Check blood sugar twice daily and as directed. Dx E11.65      Allergies:  Loratadine and Simvastatin  Review of Systems:  Constitutional: Feels well. Cardiovascular: Denies chest pain, exertional chest pain.  Pulmonary: Denies hemoptysis, pleuritic chest pain.   The remainder of systems were reviewed and were found to be negative other than what is documented in the HPI.    Physical Examination:   VS: BP 118/74 (BP Location: Left Arm, Cuff Size: Normal)   Pulse 60   Ht 6' (1.829 m)   Wt 256 lb 9.6 oz (116.4 kg)   SpO2 97%   BMI 34.80 kg/m   General Appearance: No distress  Neuro:without focal findings, mental status, speech normal, alert and oriented HEENT: PERRLA, EOM intact Pulmonary: No wheezing, No rales  CardiovascularNormal S1,S2.  No m/r/g.  Abdomen: Benign, Soft, non-tender, No masses Renal:  No costovertebral tenderness  GU:  No performed at this time. Endoc: No evident thyromegaly, no signs of acromegaly or Cushing features Skin:   warm, no rashes, no ecchymosis  Extremities: normal, no cyanosis, clubbing.      LABORATORY PANEL:   CBC No results for input(s): WBC, HGB, HCT, PLT in the last 168 hours. ------------------------------------------------------------------------------------------------------------------  Chemistries  No results for input(s): NA, K, CL, CO2, GLUCOSE, BUN, CREATININE, CALCIUM, MG, AST, ALT, ALKPHOS, BILITOT in the last 168 hours.  Invalid input(s): GFRCGP ------------------------------------------------------------------------------------------------------------------  Cardiac Enzymes No results for input(s): TROPONINI in the last 168  hours. ------------------------------------------------------------  RADIOLOGY:  No results found.     Thank  you for the consultation and for allowing [facility] to assist in the care of your patient. Our recommendations are noted above.  Please contact us if we can be of further service.  Marda Stalker, M.D., F.C.C.P.  Board Certified in Internal Medicine, Pulmonary Medicine, [place], and Sleep Medicine.  [facility] Office Number: [phone]   05/28/2018

## 2018-06-02 ENCOUNTER — Ambulatory Visit: Payer: Medicare HMO | Admitting: Internal Medicine

## 2018-06-02 ENCOUNTER — Encounter: Payer: Self-pay | Admitting: Internal Medicine

## 2018-06-02 VITALS — BP 118/74 | HR 60 | Ht 72.0 in | Wt 256.6 lb

## 2018-06-02 DIAGNOSIS — G4733 Obstructive sleep apnea (adult) (pediatric): Secondary | ICD-10-CM

## 2018-06-02 NOTE — Patient Instructions (Signed)
Place your mask tighter on your face to reduce the leak.  Make sure you rinse your mask and tubing at least once per week.

## 2018-06-16 ENCOUNTER — Encounter: Payer: Medicare HMO | Admitting: *Deleted

## 2018-06-16 VITALS — BP 154/54 | HR 74 | Resp 16 | Wt 254.4 lb

## 2018-06-16 DIAGNOSIS — Z006 Encounter for examination for normal comparison and control in clinical research program: Secondary | ICD-10-CM

## 2018-06-16 NOTE — Research (Signed)
Visit 11  Pt was here today for his last visit. He has done very well. No complaints of cp. No med changes.                                     "CONSENT"   YES     NO   Continuing further Investigational Product and study visits for follow-up? [x]  []   Continuing consent from future biomedical research [x]  []                                    "EVENTS"    YES     NO  AE   (IF YES SEE SOURCE) []  [x]   SAE  (IF YES SEE SOURCE) []  [x]   ENDPOINT   (IF YES SEE SOURCE) []  [x]   REVASCULARIZATION  (IF YES SEE SOURCE) []  [x]   AMPUTATION   (IF YES SEE SOURCE) []  [x]   TROPONIN'S  (IF YES SEE SOURCE) []  [x]     Current Outpatient Medications:  .  aspirin 81 MG tablet, Take 81 mg by mouth daily.  , Disp: , Rfl:  .  Blood Glucose Monitoring Suppl (TRUE METRIX AIR GLUCOSE METER) W/DEVICE KIT, 1 kit by Other route 2 (two) times daily. Check blood sugar twice daily and as directed. Dx E11.65, Disp: 1 kit, Rfl: 0 .  cetirizine (ZYRTEC) 10 MG tablet, TAKE 1 TABLET EVERY DAY, Disp: 90 tablet, Rfl: 1 .  Coenzyme Q10 (CO Q 10 PO), Take 200 mg by mouth daily., Disp: , Rfl:  .  Cyanocobalamin (VITAMIN B12 PO), Take 1,000 mg by mouth 2 (two) times a week., Disp: , Rfl:  .  fluorouracil (EFUDEX) 5 % cream, Apply 2 application topically daily as needed (skin lesions). , Disp: , Rfl:  .  glipiZIDE (GLUCOTROL) 10 MG tablet, Take 1 tablet (10 mg total) by mouth 2 (two) times daily with a meal., Disp: 180 tablet, Rfl: 3 .  glucose blood (TRUE METRIX BLOOD GLUCOSE TEST) test strip, Use to check blood sugar twice a day. Dx Code E11.65, Disp: 300 each, Rfl: 1 .  Insulin Glargine (LANTUS SOLOSTAR) 100 UNIT/ML Solostar Pen, INJECT  45 UNITS SUBCUTANEOUSLY AT BEDTIME, Disp: 45 mL, Rfl: 3 .  Insulin Pen Needle (UNIFINE PENTIPS) 31G X 6 MM MISC, USE PEN NEEDLES WHEN TAKING LANTUS AT BEDTIME, Disp: 100 each, Rfl: 1 .  lisinopril (PRINIVIL,ZESTRIL) 20 MG tablet, Take 1 tablet (20 mg total) by mouth daily., Disp: 90 tablet, Rfl:  3 .  metFORMIN (GLUCOPHAGE) 1000 MG tablet, Take 1 tablet (1,000 mg total) by mouth 2 (two) times daily., Disp: 180 tablet, Rfl: 3 .  metoprolol tartrate (LOPRESSOR) 25 MG tablet, TAKE 1/2 TABLET TWICE DAILY, Disp: 90 tablet, Rfl: 3 .  nitroGLYCERIN (NITROSTAT) 0.4 MG SL tablet, Place 1 tablet (0.4 mg total) every 5 (five) minutes as needed under the tongue for chest pain., Disp: 25 tablet, Rfl: 3 .  pravastatin (PRAVACHOL) 80 MG tablet, TAKE 1 TABLET EVERY EVENING, Disp: 90 tablet, Rfl: 1 .  ticagrelor (BRILINTA) 90 MG TABS tablet, Take 1 tablet (90 mg total) by mouth 2 (two) times daily., Disp: 180 tablet, Rfl: 3 .  triamcinolone (NASACORT) 55 MCG/ACT AERO nasal inhaler, USE ONE SPRAYS EACH NOSTRIL EVERY DAY as needed, Disp: , Rfl:  .  TRUEPLUS LANCETS 30G MISC, Check blood sugar twice daily  and as directed. Dx E11.65, Disp: 200 each, Rfl: 1 .  hydrochlorothiazide (HYDRODIURIL) 12.5 MG tablet, Take 1 tablet (12.5 mg total) by mouth daily., Disp: 90 tablet, Rfl: 3 .  isosorbide mononitrate (IMDUR) 30 MG 24 hr tablet, TAKE 1 TABLET EVERY DAY, Disp: 90 tablet, Rfl: 3  Lifestyle Adherence Assessment:    YES NO  Abstinence from smoking/remaining tobacco free X   Cardiac Diet X   Routine physical activity and/or cardiac rehabilitation X

## 2018-06-16 NOTE — Research (Signed)
Patient arrived to research for the [facility] 11 month f/u visit. Vitals taken and recorded at check in.

## 2018-06-17 NOTE — Research (Signed)
Visit 10 phone call No complaints of cp or sob.                                    "CONSENT"   YES     NO   Continuing further Investigational Product and study visits for follow-up? [x] []  Continuing consent from future biomedical research [x] []                                  "EVENTS"    YES     NO  AE   (IF YES SEE SOURCE) [] [x]  SAE  (IF YES SEE SOURCE) [] [x]  ENDPOINT   (IF YES SEE SOURCE) [] [x]  REVASCULARIZATION  (IF YES SEE SOURCE) [] [x]  AMPUTATION   (IF YES SEE SOURCE) [] [x]  TROPONIN'S  (IF YES SEE SOURCE) [] [x]    Current Outpatient Medications:  .  aspirin 81 MG tablet, Take 81 mg by mouth daily.  , Disp: , Rfl:  .  Blood Glucose Monitoring Suppl (TRUE METRIX AIR GLUCOSE METER) W/DEVICE KIT, 1 kit by Other route 2 (two) times daily. Check blood sugar twice daily and as directed. Dx E11.65, Disp: 1 kit, Rfl: 0 .  cetirizine (ZYRTEC) 10 MG tablet, TAKE 1 TABLET EVERY DAY, Disp: 90 tablet, Rfl: 1 .  Coenzyme Q10 (CO Q 10 PO), Take 200 mg by mouth daily., Disp: , Rfl:  .  Cyanocobalamin (VITAMIN B12 PO), Take 1,000 mg by mouth 2 (two) times a week., Disp: , Rfl:  .  fluorouracil (EFUDEX) 5 % cream, Apply 2 application topically daily as needed (skin lesions). , Disp: , Rfl:  .  glipiZIDE (GLUCOTROL) 10 MG tablet, Take 1 tablet (10 mg total) by mouth 2 (two) times daily with a meal., Disp: 180 tablet, Rfl: 3 .  glucose blood (TRUE METRIX BLOOD GLUCOSE TEST) test strip, Use to check blood sugar twice a day. Dx Code E11.65, Disp: 300 each, Rfl: 1 .  hydrochlorothiazide (HYDRODIURIL) 12.5 MG tablet, Take 1 tablet (12.5 mg total) by mouth daily., Disp: 90 tablet, Rfl: 3 .  Insulin Glargine (LANTUS SOLOSTAR) 100 UNIT/ML Solostar Pen, INJECT  45 UNITS SUBCUTANEOUSLY AT BEDTIME, Disp: 45 mL, Rfl: 3 .  Insulin Pen Needle (UNIFINE PENTIPS) 31G X 6 MM MISC, USE PEN NEEDLES WHEN TAKING LANTUS AT BEDTIME, Disp: 100 each, Rfl: 1 .  isosorbide mononitrate (IMDUR) 30 MG 24 hr tablet,  TAKE 1 TABLET EVERY DAY, Disp: 90 tablet, Rfl: 3 .  lisinopril (PRINIVIL,ZESTRIL) 20 MG tablet, Take 1 tablet (20 mg total) by mouth daily., Disp: 90 tablet, Rfl: 3 .  metFORMIN (GLUCOPHAGE) 1000 MG tablet, Take 1 tablet (1,000 mg total) by mouth 2 (two) times daily., Disp: 180 tablet, Rfl: 3 .  metoprolol tartrate (LOPRESSOR) 25 MG tablet, TAKE 1/2 TABLET TWICE DAILY, Disp: 90 tablet, Rfl: 3 .  nitroGLYCERIN (NITROSTAT) 0.4 MG SL tablet, Place 1 tablet (0.4 mg total) every 5 (five) minutes as needed under the tongue for chest pain., Disp: 25 tablet, Rfl: 3 .  pravastatin (PRAVACHOL) 80 MG tablet, TAKE 1 TABLET EVERY EVENING, Disp: 90 tablet, Rfl: 1 .  ticagrelor (BRILINTA) 90 MG TABS tablet, Take 1 tablet (90 mg total) by mouth 2 (two) times daily., Disp: 180 tablet, Rfl: 3 .  triamcinolone (NASACORT) 55 MCG/ACT AERO   nasal inhaler, USE ONE SPRAYS EACH NOSTRIL EVERY DAY as needed, Disp: , Rfl:  .  TRUEPLUS LANCETS 30G MISC, Check blood sugar twice daily and as directed. Dx E11.65, Disp: 200 each, Rfl: 1    

## 2018-06-28 ENCOUNTER — Other Ambulatory Visit: Payer: Self-pay | Admitting: Family Medicine

## 2018-07-03 ENCOUNTER — Emergency Department: Payer: Medicare HMO

## 2018-07-03 ENCOUNTER — Other Ambulatory Visit: Payer: Self-pay

## 2018-07-03 ENCOUNTER — Emergency Department
Admission: EM | Admit: 2018-07-03 | Discharge: 2018-07-03 | Disposition: A | Discharge status: Home / Self Care | Payer: Medicare HMO | Attending: Emergency Medicine | Admitting: Emergency Medicine

## 2018-07-03 DIAGNOSIS — I10 Essential (primary) hypertension: Secondary | ICD-10-CM | POA: Insufficient documentation

## 2018-07-03 DIAGNOSIS — Z7982 Long term (current) use of aspirin: Secondary | ICD-10-CM | POA: Insufficient documentation

## 2018-07-03 DIAGNOSIS — Z79899 Other long term (current) drug therapy: Secondary | ICD-10-CM | POA: Insufficient documentation

## 2018-07-03 DIAGNOSIS — Z794 Long term (current) use of insulin: Secondary | ICD-10-CM | POA: Insufficient documentation

## 2018-07-03 DIAGNOSIS — R05 Cough: Secondary | ICD-10-CM | POA: Diagnosis present

## 2018-07-03 DIAGNOSIS — E119 Type 2 diabetes mellitus without complications: Secondary | ICD-10-CM | POA: Diagnosis not present

## 2018-07-03 DIAGNOSIS — R0989 Other specified symptoms and signs involving the circulatory and respiratory systems: Secondary | ICD-10-CM | POA: Diagnosis not present

## 2018-07-03 DIAGNOSIS — J189 Pneumonia, unspecified organism: Secondary | ICD-10-CM | POA: Diagnosis not present

## 2018-07-03 LAB — BASIC METABOLIC PANEL
Anion gap: 8 (ref 5–15)
BUN: 21 mg/dL (ref 8–23)
CALCIUM: 8.8 mg/dL — AB (ref 8.9–10.3)
CO2: 20 mmol/L — ABNORMAL LOW (ref 22–32)
CREATININE: 1.06 mg/dL (ref 0.61–1.24)
Chloride: 108 mmol/L (ref 98–111)
GFR calc Af Amer: >60 mL/min (ref >60)
GFR calc non Af Amer: >60 mL/min (ref >60)
Glucose, Bld: 177 mg/dL — ABNORMAL HIGH (ref 70–99)
Potassium: 4 mmol/L (ref 3.5–5.1)
Sodium: 136 mmol/L (ref 135–145)

## 2018-07-03 LAB — CBC
HCT: 41.1 % (ref 39.0–52.0)
Hemoglobin: 13.5 g/dL (ref 13.0–17.0)
MCH: 30.7 pg (ref 26.0–34.0)
MCHC: 32.8 g/dL (ref 30.0–36.0)
MCV: 93.4 fL (ref 80.0–100.0)
PLATELETS: 178 10*3/uL (ref 150–400)
RBC: 4.4 MIL/uL (ref 4.22–5.81)
RDW: 14.5 % (ref 11.5–15.5)
WBC: 14.4 10*3/uL — ABNORMAL HIGH (ref 4.0–10.5)
nRBC: 0 % (ref 0.0–0.2)

## 2018-07-03 LAB — INFLUENZA PANEL BY PCR (TYPE A & B)
Influenza A By PCR: NEGATIVE
Influenza B By PCR: NEGATIVE

## 2018-07-03 LAB — TROPONIN I: Troponin I: <0.03 ng/mL (ref <0.03)

## 2018-07-03 IMAGING — CR DG CHEST 2V
1 series · 2 of 2 positions shown · non-contrast
Comparison: Chest radiograph performed [DATE]

CLINICAL DATA: Subacute onset of cough, fever, shortness of breath
and high blood pressure.

EXAM:
CHEST - 2 VIEW

[Series 1: dg chest 2 view · 0.14mm/px · 2 of 2 slices shown]
[im 1/2]
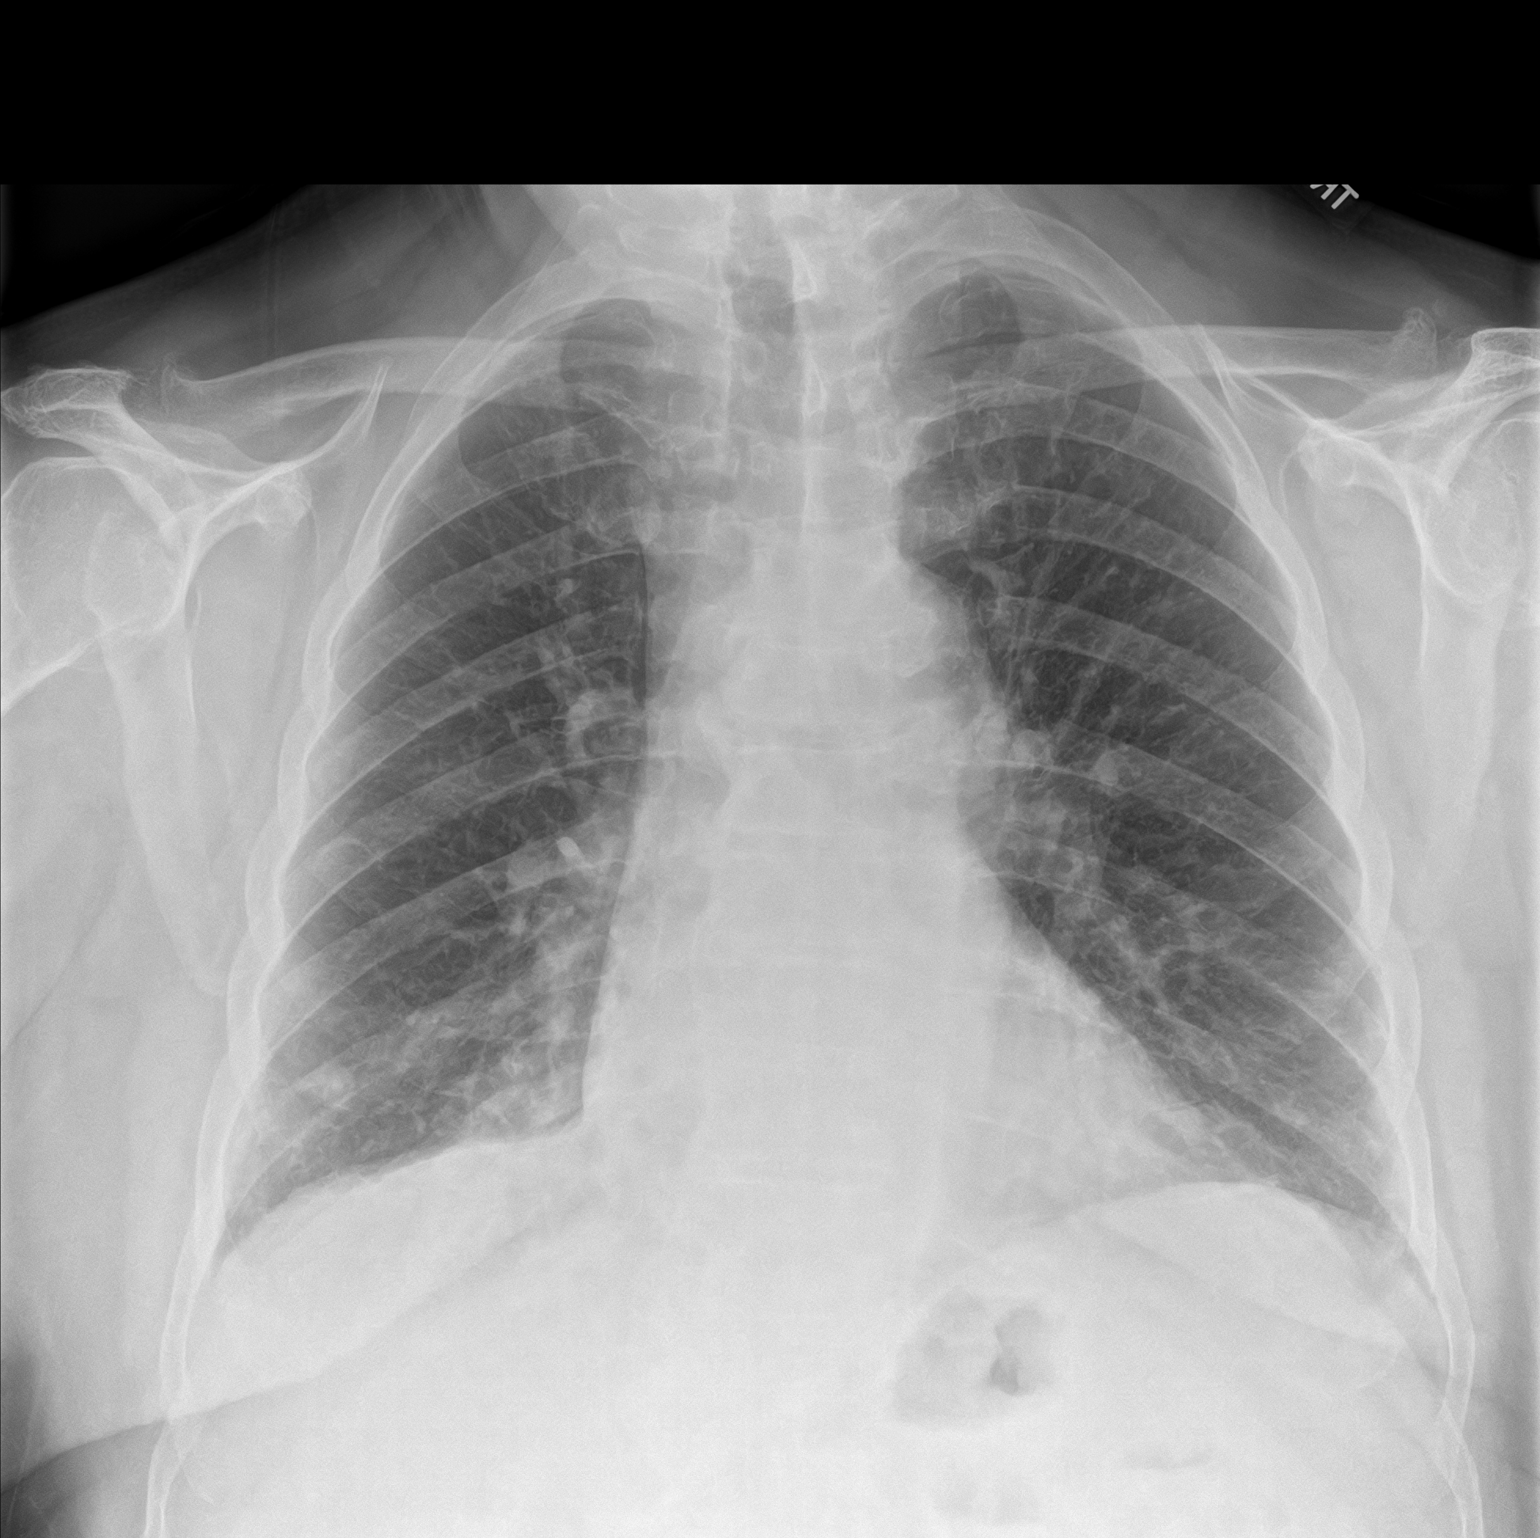
[im 2/2]
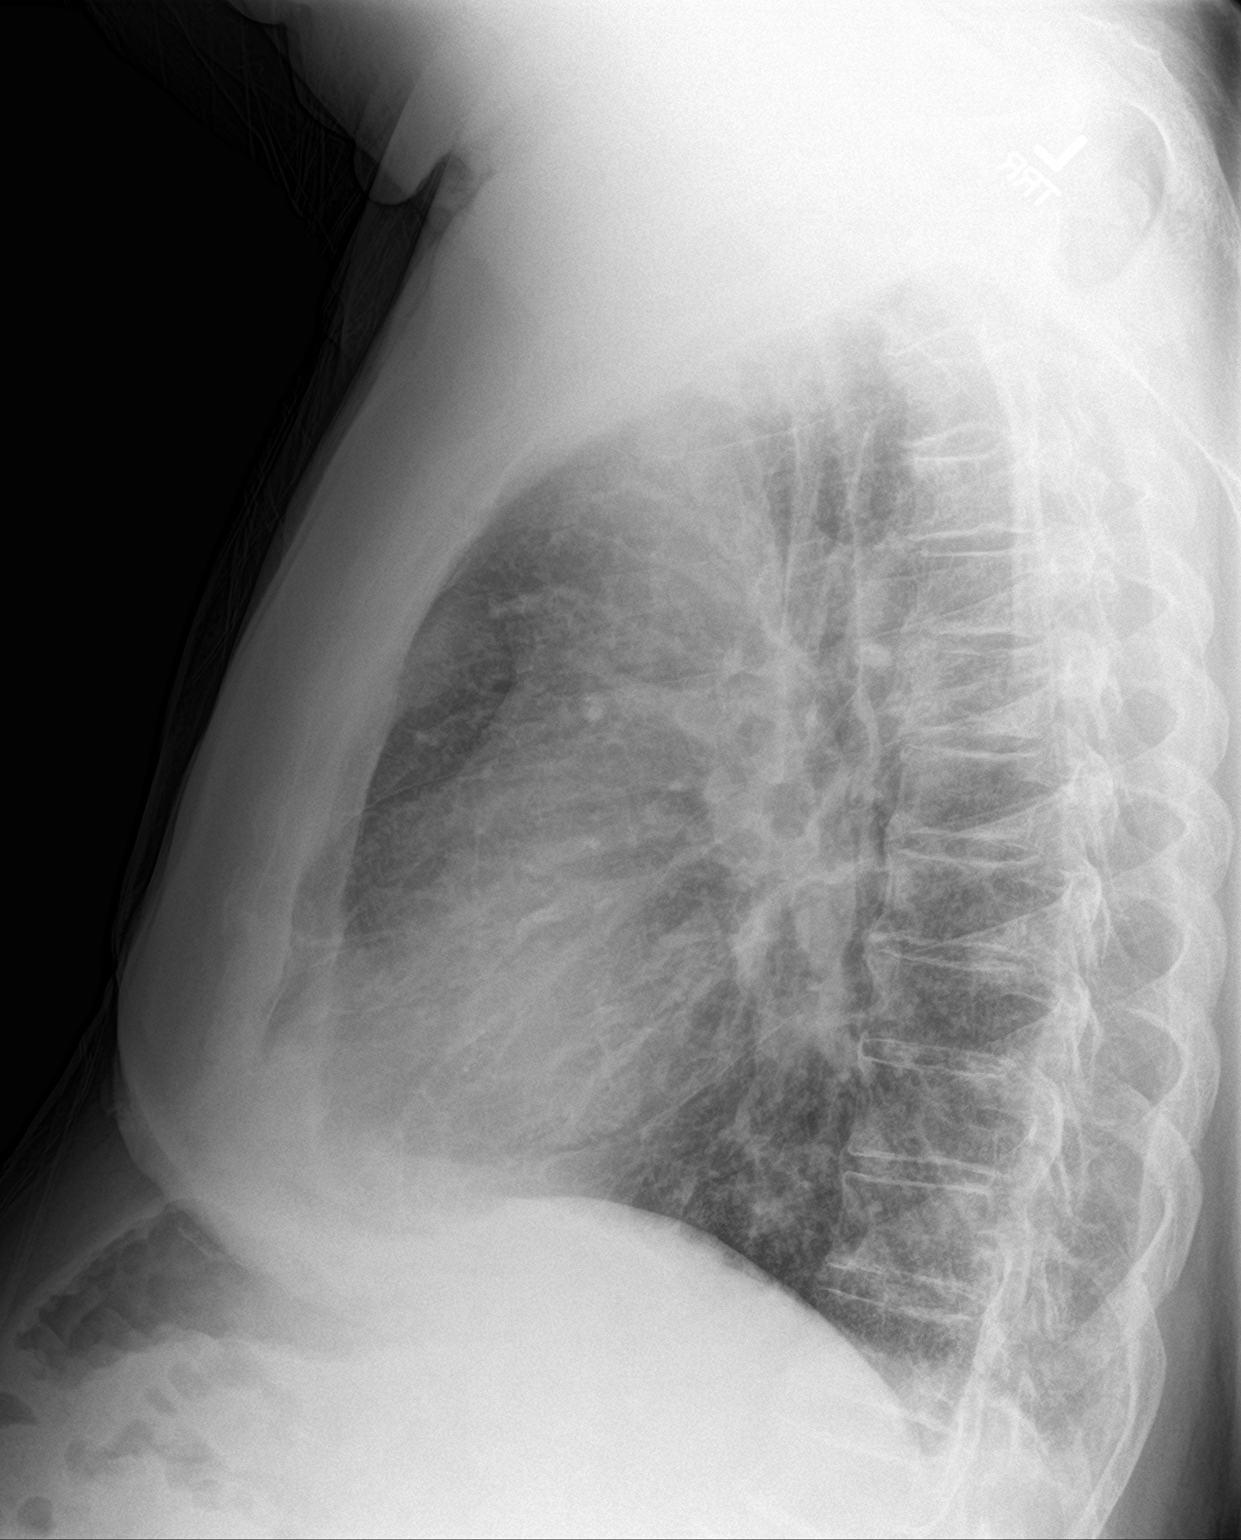

[2 of 2 positions shown; findings below may reference images not displayed]

FINDINGS: The lungs are well-aerated. Vascular congestion is noted. Minimal
bibasilar opacities may reflect pneumonia, given the patient's
symptoms. There is no evidence of pleural effusion or pneumothorax.
A right basilar nodular density is stable in appearance, and may
reflect a nipple shadow or possibly a calcified granuloma.

The heart is normal in size; the mediastinal contour is within
normal limits. No acute osseous abnormalities are seen.
IMPRESSION: Vascular congestion noted. Minimal bibasilar opacities may reflect
pneumonia, given the patient's symptoms.

## 2018-07-03 MED ORDER — AZITHROMYCIN 250 MG PO TABS: ORAL_TABLET | ORAL | 0 refills | Status: AC

## 2018-07-03 MED ORDER — AZITHROMYCIN 250 MG PO TABS: ORAL_TABLET | ORAL | 0 refills | Status: DC

## 2018-07-03 MED ORDER — HYDROCOD POLST-CPM POLST ER 10-8 MG/5ML PO SUER: 5.0000 mL | Freq: Two times a day (BID) | ORAL | 0 refills | Status: DC | PRN

## 2018-07-03 MED ORDER — HYDROCOD POLST-CPM POLST ER 10-8 MG/5ML PO SUER
5.0000 mL | Freq: Once | ORAL | Status: AC
  Administered 2018-07-03: 5 mL via ORAL
  Filled 2018-07-03: qty 5

## 2018-07-03 MED ORDER — AZITHROMYCIN 500 MG PO TABS
500.0000 mg | ORAL_TABLET | Freq: Once | ORAL | Status: AC
  Administered 2018-07-03: 500 mg via ORAL
  Filled 2018-07-03: qty 1

## 2018-07-03 NOTE — ED Triage Notes (Signed)
Pt arrives to ED via POV from home with c/o cough, fever, SHOB, and HTN x3-4 weeks. Pt reports congested cough with green/gray sputum. Pt also reports fever at home but unsure of exact temp. Pt states BP at home taken by EMS 1hr PTA and it was "200 over something". Pt denies N/V/D.

## 2018-07-03 NOTE — ED Notes (Signed)
Pt verbalized understanding of d/c instructions, rx, and f/u care. No further questions at this time. Pt assisted to the exit via wheelchair at this time.

## 2018-07-03 NOTE — ED Provider Notes (Addendum)
[facility] Emergency Department Provider Note   First MD Initiated Contact with Patient 07/03/18 [phone]     (approximate)  I have reviewed the triage vital signs and the nursing notes.   HISTORY  Chief Complaint Fever; Cough; and Hypertension   HPI Ryan Garcia is a 85 y.o. male with below list of chronic medical conditions presents to the emergency department cough fever dyspnea x4 weeks.  Patient admits to congestion and productive cough with greenish-gray sputum.  Patient febrile on presentation temperature 101.1.  Patient states that he did receive a flu shot and of pneumonia vaccination this year.   Past Medical History:  Diagnosis Date  . AC (acromioclavicular) joint bone spurs    lt ankle  . Allergy    allergic rhinitis  . Arthritis    OA  . BPH (benign prostatic hyperplasia)    microwave tx of prostate  . CAD (coronary artery disease)    a. s/p cath in 2012 showing 70-75% LAD stenosis, 75% D1 stenosis, 75% OM1, 60-70% RCA stenosis, and 80% PDA --> medical therapy pursued b. similar findings by cath in 2016; 06/2017 DES to distal RCA  . Chronic upper back pain   . Diabetes mellitus    type II x8 years  . GERD (gastroesophageal reflux disease)   . HLD (hyperlipidemia)    10/12: TC 208, TG 213, HDL 36, LDL 129  . Hx of skin cancer, basal cell    many skin cancers removed/under constant treatment.  . Hypertension   . NSTEMI (non-ST elevated myocardial infarction) ([place]) 06/18/2017   DES to distal RCA  . Obstructive sleep apnea on CPAP   . Scoliosis     Patient Active Problem List   Diagnosis Date Noted  . HOH (hard of hearing) 03/31/2018  . Bleeding gums 11/22/2017  . Uncontrolled type 2 diabetes mellitus with hyperglycemia ([place]) 10/28/2017  . Subclinical hypothyroidism 10/28/2017  . Gross hematuria 09/23/2017  . Scoliosis   . Obstructive sleep apnea on CPAP   . Hx of skin cancer, basal cell   . Dyslipidemia, goal LDL below 70   .  GERD (gastroesophageal reflux disease)   . Chronic upper back pain   . Arthritis   . Allergy   . AC (acromioclavicular) joint bone spurs   . NSTEMI (non-ST elevated myocardial infarction) () 06/18/2017  . Actinic keratoses 04/29/2017  . Positive colorectal cancer screening using Cologuard test 10/31/2016  . BPH (benign prostatic hyperplasia) 09/04/2016  . B12 deficiency 11/22/2015  . Routine general medical examination at a health care facility 08/26/2015  . Abnormal nuclear cardiac imaging test   . Dyspnea on exertion 11/27/2013  . Encounter for examination of normal volunteer in research study 05/29/2013  . Prostate cancer screening 07/15/2012  . CAD S/P percutaneous coronary angioplasty 05/17/2011  . Nonspecific abnormal results of cardiovascular function study 04/30/2011  . Abnormal EKG 04/06/2011  . Left arm pain 04/06/2011  . Right low back pain 01/13/2010  . Insulin dependent diabetes mellitus ([place]) 10/04/2006  . Class 2 severe obesity due to excess calories with serious comorbidity and body mass index (BMI) of 35.0 to 35.9 in adult ([employer] [facility] [employer]) 10/04/2006  . ERECTILE DYSFUNCTION 10/04/2006  . Essential hypertension 10/04/2006  . ALLERGIC RHINITIS 10/04/2006  . OSTEOARTHRITIS 10/04/2006  . Sleep apnea 10/04/2006  . EDEMA 10/04/2006  . SKIN CANCER, HX OF 10/04/2006    Past Surgical History:  Procedure Laterality Date  . BREAST SURGERY  2009   breast lump  removed benign  . CARDIAC CATHETERIZATION N/A 03/15/2015   Procedure: Left Heart Cath and Coronary Angiography;  Surgeon: Belva Crome, MD;  Location: [place] [facility];  Service: Cardiovascular;  Laterality: N/A;  . CORONARY STENT INTERVENTION N/A 06/18/2017   Procedure: CORONARY STENT INTERVENTION;  Surgeon: Jettie Booze, MD;  Location: [place] [facility];  Service: Cardiovascular;  Laterality: N/A;  RCA  . LEFT HEART CATH AND CORONARY ANGIOGRAPHY N/A 06/18/2017   Procedure: LEFT HEART CATH AND CORONARY  ANGIOGRAPHY;  Surgeon: Jettie Booze, MD;  Location: [place] [facility];  Service: Cardiovascular;  Laterality: N/A;  . ULTRASOUND GUIDANCE FOR VASCULAR ACCESS  06/18/2017   Procedure: Ultrasound Guidance For Vascular Access;  Surgeon: Jettie Booze, MD;  Location: [place] [facility];  Service: Cardiovascular;;  right radial, right femoral    Prior to Admission medications   Medication Sig Start Date End Date Taking? Authorizing Provider  aspirin 81 MG tablet Take 81 mg by mouth daily.      [provider]  Blood Glucose Monitoring Suppl (TRUE METRIX AIR GLUCOSE METER) W/DEVICE KIT 1 kit by Other route 2 (two) times daily. Check blood sugar twice daily and as directed. Dx E11.65 03/17/15   Tower, Wynelle Fanny, MD  cetirizine (ZYRTEC) 10 MG tablet TAKE 1 TABLET EVERY DAY 11/14/16   Tower, Wynelle Fanny, MD  Coenzyme Q10 (CO Q 10 PO) Take 200 mg by mouth daily.    [provider]  Cyanocobalamin (VITAMIN B12 PO) Take 1,000 mg by mouth 2 (two) times a week.    [provider]  fluorouracil (EFUDEX) 5 % cream Apply 2 application topically daily as needed (skin lesions).  02/23/15   [provider]  glipiZIDE (GLUCOTROL) 10 MG tablet Take 1 tablet (10 mg total) by mouth 2 (two) times daily with a meal. 10/28/17   Tower, Wynelle Fanny, MD  glucose blood (TRUE METRIX BLOOD GLUCOSE TEST) test strip Use to check blood sugar twice a day. Dx Code E11.65 12/18/17   Tower, Wynelle Fanny, MD  hydrochlorothiazide (HYDRODIURIL) 12.5 MG tablet Take 1 tablet (12.5 mg total) by mouth daily. 12/24/17   Lelon Perla, MD  Insulin Glargine (LANTUS SOLOSTAR) 100 UNIT/ML Solostar Pen INJECT  45 UNITS SUBCUTANEOUSLY AT BEDTIME 05/08/18   Tower, [place] A, MD  Insulin Pen Needle (DROPLET PEN NEEDLES) 31G X 6 MM MISC USE PEN NEEDLES WHEN TAKING LANTUS AT BEDTIME 06/30/18   Tower, [place] A, MD  isosorbide mononitrate (IMDUR) 30 MG 24 hr tablet TAKE 1 TABLET EVERY DAY 05/09/18   Strader, [place] M, PA-C    lisinopril (PRINIVIL,ZESTRIL) 20 MG tablet Take 1 tablet (20 mg total) by mouth daily. 09/24/17   Lelon Perla, MD  metFORMIN (GLUCOPHAGE) 1000 MG tablet Take 1 tablet (1,000 mg total) by mouth 2 (two) times daily. 11/22/17   Tower, Wynelle Fanny, MD  metoprolol tartrate (LOPRESSOR) 25 MG tablet TAKE 1/2 TABLET TWICE DAILY 12/18/17   Lelon Perla, MD  nitroGLYCERIN (NITROSTAT) 0.4 MG SL tablet Place 1 tablet (0.4 mg total) every 5 (five) minutes as needed under the tongue for chest pain. 05/06/17   Ahmed Prima, Fransisco Hertz, PA-C  pravastatin (PRAVACHOL) 80 MG tablet TAKE 1 TABLET EVERY EVENING 04/25/18   Lelon Perla, MD  ticagrelor (BRILINTA) 90 MG TABS tablet Take 1 tablet (90 mg total) by mouth 2 (two) times daily. 11/22/17   Tower, Wynelle Fanny, MD  triamcinolone (NASACORT) 55 MCG/ACT AERO nasal inhaler USE  ONE SPRAYS EACH NOSTRIL EVERY DAY as needed 11/26/13   Tower, Wynelle Fanny, MD  TRUEPLUS LANCETS 30G MISC Check blood sugar twice daily and as directed. Dx E11.65 03/17/15   TowerWynelle Fanny, MD    Allergies Loratadine and Simvastatin  Family History  Problem Relation Age of Onset  . Heart attack Brother   . Colon cancer Neg Hx     Social History Social History   Tobacco Use  . Smoking status: Never Smoker  . Smokeless tobacco: Never Used  Substance Use Topics  . Alcohol use: No    Alcohol/week: 0.0 standard drinks  . Drug use: No    Review of Systems Constitutional: Positive for fever Eyes: No visual changes. ENT: No sore throat. Cardiovascular: Denies chest pain. Respiratory: Denies shortness of breath.  Positive for cough Gastrointestinal: No abdominal pain.  No nausea, no vomiting.  No diarrhea.  No constipation. Genitourinary: Negative for dysuria. Musculoskeletal: Negative for neck pain.  Negative for back pain. Integumentary: Negative for rash. Neurological: Negative for headaches, focal weakness or numbness.   ____________________________________________   PHYSICAL  EXAM:  VITAL SIGNS: ED Triage Vitals  Enc Vitals Group     BP 07/03/18 0300 (!) 144/57     Pulse Rate 07/03/18 0300 89     Resp 07/03/18 0300 18     Temp 07/03/18 0300 (!) 101.1 F (38.4 C)     Temp Source 07/03/18 0300 Oral     SpO2 07/03/18 0300 95 %     Weight 07/03/18 0301 113.4 kg (250 lb)     Height 07/03/18 0301 1.829 m (6')     Head Circumference --      Peak Flow --      Pain Score 07/03/18 0301 0     Pain Loc --      Pain Edu? --      Excl. in [place]? --     Constitutional: Alert and oriented.  Coughing  eyes: Conjunctivae are normal. Head: Atraumatic. Mouth/Throat: Mucous membranes are moist. Oropharynx non-erythematous. Neck: No stridor.   Cardiovascular: Normal rate, regular rhythm. Good peripheral circulation. Grossly normal heart sounds. Respiratory: Normal respiratory effort.  No retractions.  Bibasilar rhonchi Gastrointestinal: Soft and nontender. No distention.  Musculoskeletal: No lower extremity tenderness nor edema. No gross deformities of extremities. Neurologic:  Normal speech and language. No gross focal neurologic deficits are appreciated.  Skin:  Skin is warm, dry and intact. No rash noted.  ____________________________________________   LABS (all labs ordered are listed, but only abnormal results are displayed)  Labs Reviewed  BASIC METABOLIC PANEL - Abnormal; Notable for the following components:      Result Value   CO2 20 (*)    Glucose, Bld 177 (*)    Calcium 8.8 (*)    All other components within normal limits  CBC - Abnormal; Notable for the following components:   WBC 14.4 (*)    All other components within normal limits  TROPONIN I  INFLUENZA PANEL BY PCR (TYPE A & B)   _______________________  RADIOLOGY I, [place] N , personally viewed and evaluated these images (plain radiographs) as part of my medical decision making, as well as reviewing the written report by the radiologist.  ED MD interpretation: Bibasilar opacities  concerning for pneumonia.  Official radiology report(s): Dg Chest 2 View  Result Date: 07/03/2018 CLINICAL DATA:  Subacute onset of cough, fever, shortness of breath and high blood pressure. EXAM: CHEST - 2 VIEW COMPARISON:  Chest radiograph  performed 06/18/2007 FINDINGS: The lungs are well-aerated. Vascular congestion is noted. Minimal bibasilar opacities may reflect pneumonia, given the patient's symptoms. There is no evidence of pleural effusion or pneumothorax. A right basilar nodular density is stable in appearance, and may reflect a nipple shadow or possibly a calcified granuloma. The heart is normal in size; the mediastinal contour is within normal limits. No acute osseous abnormalities are seen. IMPRESSION: Vascular congestion noted. Minimal bibasilar opacities may reflect pneumonia, given the patient's symptoms. Electronically Signed   By: Garald Balding M.D.   On: 07/03/2018 03:22      Procedures   ____________________________________________   INITIAL IMPRESSION / ASSESSMENT AND PLAN / ED COURSE  As part of my medical decision making, I reviewed the following data within the electronic MEDICAL RECORD NUMBER   85 year old male presenting with above-stated history and physical exam concerning for pneumonia or bronchitis or influenza.  Patient given Tussionex and azithromycin after reviewing chest x-ray which is consistent with pneumonia.  Patient will be prescribed azithromycin and Tessalon for home. ____________________________________________  FINAL CLINICAL IMPRESSION(S) / ED DIAGNOSES  Final diagnoses:  Community acquired pneumonia, unspecified laterality     MEDICATIONS GIVEN DURING THIS VISIT:  Medications  chlorpheniramine-HYDROcodone (TUSSIONEX) 10-8 MG/5ML suspension 5 mL (5 mLs Oral Given 07/03/18 0536)  azithromycin (ZITHROMAX) tablet 500 mg (500 mg Oral Given 07/03/18 0535)     ED Discharge Orders    None       Note:  This document was prepared using Dragon  voice recognition software and may include unintentional dictation errors.    Gregor Hams, MD 07/03/18 2328    Gregor Hams, MD 07/03/18 2329

## 2018-07-09 ENCOUNTER — Ambulatory Visit (INDEPENDENT_AMBULATORY_CARE_PROVIDER_SITE_OTHER): Payer: Medicare HMO | Admitting: Family Medicine

## 2018-07-09 ENCOUNTER — Encounter: Payer: Self-pay | Admitting: Family Medicine

## 2018-07-09 VITALS — BP 148/62 | HR 74 | Temp 98.3°F | Ht 72.0 in | Wt 248.2 lb

## 2018-07-09 DIAGNOSIS — E669 Obesity, unspecified: Secondary | ICD-10-CM

## 2018-07-09 DIAGNOSIS — E1165 Type 2 diabetes mellitus with hyperglycemia: Secondary | ICD-10-CM | POA: Diagnosis not present

## 2018-07-09 DIAGNOSIS — J189 Pneumonia, unspecified organism: Secondary | ICD-10-CM | POA: Insufficient documentation

## 2018-07-09 MED ORDER — BENZONATATE 200 MG PO CAPS: 200.0000 mg | ORAL_CAPSULE | Freq: Three times a day (TID) | ORAL | 1 refills | Status: DC | PRN

## 2018-07-09 MED ORDER — HYDROCOD POLST-CPM POLST ER 10-8 MG/5ML PO SUER: 5.0000 mL | Freq: Two times a day (BID) | ORAL | 0 refills | Status: DC | PRN

## 2018-07-09 NOTE — Patient Instructions (Addendum)
Go ahead and take the zpak again  Drink lots of fluids and rest   Take the tussionex for cough (watch out for sedation and constipation)  Also tessalon (this will help tickle in throat)   Update if not starting to improve in a week or if worsening

## 2018-07-09 NOTE — Assessment & Plan Note (Signed)
Reviewed hospital records, lab results and studies in detail  Gradually clinically improving (bothersome cough)  Reassuring exam  Will repeat z pack given severity  tussionex refilled Tessalon prn Symptom care Update if not starting to improve in a week or if worsening

## 2018-07-09 NOTE — Assessment & Plan Note (Signed)
Lab Results  Component Value Date   HGBA1C 7.2 (H) 04/28/2018

## 2018-07-09 NOTE — Assessment & Plan Note (Signed)
Discussed how this problem influences overall health and the risks it imposes  Reviewed plan for weight loss with lower calorie diet (via better food choices and also portion control or program like weight watchers) and exercise building up to or more than 30 minutes 5 days per week including some aerobic activity    

## 2018-07-09 NOTE — Progress Notes (Signed)
Subjective:    Patient ID: Ryan Garcia, male    DOB: 1934/05/30, 85 y.o.   MRN: [id number]  HPI  Here for ED f/u   Seen on1/2 for fever and cough and HTN   Presented with cough/fever and sob for 4 wk Prod cough with colored sputum Temp 101.1 at presentation   Dg Chest 2 View  Result Date: 07/03/2018 CLINICAL DATA:  Subacute onset of cough, fever, shortness of breath and high blood pressure. EXAM: CHEST - 2 VIEW COMPARISON:  Chest radiograph performed 06/18/2007 FINDINGS: The lungs are well-aerated. Vascular congestion is noted. Minimal bibasilar opacities may reflect pneumonia, given the patient's symptoms. There is no evidence of pleural effusion or pneumothorax. A right basilar nodular density is stable in appearance, and may reflect a nipple shadow or possibly a calcified granuloma. The heart is normal in size; the mediastinal contour is within normal limits. No acute osseous abnormalities are seen. IMPRESSION: Vascular congestion noted. Minimal bibasilar opacities may reflect pneumonia, given the patient's symptoms. Electronically Signed   By: Garald Balding M.D.   On: 07/03/2018 03:22   tx with azithromycin for likely CAP Also tussionex in ED  D/c with tessalon for home    Pulse ox is 93% today   Cough is less but still significant  Productive of yellow to grey - less green than it was - way less Not sob  Wheezes only with cough spells   No more fever  Feels tickle in throat  No nasal congestion   Wt Readings from Last 3 Encounters:  07/09/18 248 lb 4 oz (112.6 kg)  07/03/18 250 lb (113.4 kg)  06/16/18 254 lb 6.4 oz (115.4 kg)   33.67 kg/m   Patient Active Problem List   Diagnosis Date Noted  . CAP (community acquired pneumonia) 07/09/2018  . HOH (hard of hearing) 03/31/2018  . Bleeding gums 11/22/2017  . Uncontrolled type 2 diabetes mellitus with hyperglycemia ([place]) 10/28/2017  . Subclinical hypothyroidism 10/28/2017  . Gross hematuria 09/23/2017  .  Scoliosis   . Obstructive sleep apnea on CPAP   . Hx of skin cancer, basal cell   . Dyslipidemia, goal LDL below 70   . GERD (gastroesophageal reflux disease)   . Chronic upper back pain   . Arthritis   . Allergy   . AC (acromioclavicular) joint bone spurs   . NSTEMI (non-ST elevated myocardial infarction) ([place]) 06/18/2017  . Actinic keratoses 04/29/2017  . Positive colorectal cancer screening using Cologuard test 10/31/2016  . BPH (benign prostatic hyperplasia) 09/04/2016  . B12 deficiency 11/22/2015  . Routine general medical examination at a health care facility 08/26/2015  . Abnormal nuclear cardiac imaging test   . Dyspnea on exertion 11/27/2013  . Encounter for examination of normal volunteer in research study 05/29/2013  . Prostate cancer screening 07/15/2012  . CAD S/P percutaneous coronary angioplasty 05/17/2011  . Nonspecific abnormal results of cardiovascular function study 04/30/2011  . Abnormal EKG 04/06/2011  . Left arm pain 04/06/2011  . Right low back pain 01/13/2010  . Insulin dependent diabetes mellitus ([place]) 10/04/2006  . Class 2 severe obesity due to excess calories with serious comorbidity and body mass index (BMI) of 35.0 to 35.9 in adult ([place]) 10/04/2006  . ERECTILE DYSFUNCTION 10/04/2006  . Essential hypertension 10/04/2006  . ALLERGIC RHINITIS 10/04/2006  . OSTEOARTHRITIS 10/04/2006  . Sleep apnea 10/04/2006  . EDEMA 10/04/2006  . SKIN CANCER, HX OF 10/04/2006   Past Medical History:  Diagnosis Date  .  AC (acromioclavicular) joint bone spurs    lt ankle  . Allergy    allergic rhinitis  . Arthritis    OA  . BPH (benign prostatic hyperplasia)    microwave tx of prostate  . CAD (coronary artery disease)    a. s/p cath in 2012 showing 70-75% LAD stenosis, 75% D1 stenosis, 75% OM1, 60-70% RCA stenosis, and 80% PDA --> medical therapy pursued b. similar findings by cath in 2016; 06/2017 DES to distal RCA  . Chronic upper back pain   . Diabetes  mellitus    type II x8 years  . GERD (gastroesophageal reflux disease)   . HLD (hyperlipidemia)    10/12: TC 208, TG 213, HDL 36, LDL 129  . Hx of skin cancer, basal cell    many skin cancers removed/under constant treatment.  . Hypertension   . NSTEMI (non-ST elevated myocardial infarction) ([place]) 06/18/2017   DES to distal RCA  . Obstructive sleep apnea on CPAP   . Scoliosis    Past Surgical History:  Procedure Laterality Date  . BREAST SURGERY  2009   breast lump removed benign  . CARDIAC CATHETERIZATION N/A 03/15/2015   Procedure: Left Heart Cath and Coronary Angiography;  Surgeon: Belva Crome, MD;  Location: [facility];  Service: Cardiovascular;  Laterality: N/A;  . CORONARY STENT INTERVENTION N/A 06/18/2017   Procedure: CORONARY STENT INTERVENTION;  Surgeon: Jettie Booze, MD;  Location: [facility];  Service: Cardiovascular;  Laterality: N/A;  RCA  . LEFT HEART CATH AND CORONARY ANGIOGRAPHY N/A 06/18/2017   Procedure: LEFT HEART CATH AND CORONARY ANGIOGRAPHY;  Surgeon: Jettie Booze, MD;  Location: [place] [facility];  Service: Cardiovascular;  Laterality: N/A;  . ULTRASOUND GUIDANCE FOR VASCULAR ACCESS  06/18/2017   Procedure: Ultrasound Guidance For Vascular Access;  Surgeon: Jettie Booze, MD;  Location: [place] [facility];  Service: Cardiovascular;;  right radial, right femoral   Social History   Tobacco Use  . Smoking status: Never Smoker  . Smokeless tobacco: Never Used  Substance Use Topics  . Alcohol use: No    Alcohol/week: 0.0 standard drinks  . Drug use: No   Family History  Problem Relation Age of Onset  . Heart attack Brother   . Colon cancer Neg Hx    Allergies  Allergen Reactions  . Loratadine     REACTION: not effective  . Simvastatin     REACTION: intolerant   Current Outpatient Medications on File Prior to Visit  Medication Sig Dispense Refill  . aspirin 81 MG tablet Take 81 mg by mouth daily.      . Blood  Glucose Monitoring Suppl (TRUE METRIX AIR GLUCOSE METER) W/DEVICE KIT 1 kit by Other route 2 (two) times daily. Check blood sugar twice daily and as directed. Dx E11.65 1 kit 0  . cetirizine (ZYRTEC) 10 MG tablet TAKE 1 TABLET EVERY DAY 90 tablet 1  . Coenzyme Q10 (CO Q 10 PO) Take 200 mg by mouth daily.    . Cyanocobalamin (VITAMIN B12 PO) Take 1,000 mg by mouth 2 (two) times a week.    . fluorouracil (EFUDEX) 5 % cream Apply 2 application topically daily as needed (skin lesions).     [place] Kitchen glipiZIDE (GLUCOTROL) 10 MG tablet Take 1 tablet (10 mg total) by mouth 2 (two) times daily with a meal. 180 tablet 3  . glucose blood (TRUE METRIX BLOOD GLUCOSE TEST) test strip Use to check blood sugar twice  a day. Dx Code E11.65 300 each 1  . hydrochlorothiazide (HYDRODIURIL) 12.5 MG tablet Take 1 tablet (12.5 mg total) by mouth daily. 90 tablet 3  . Insulin Glargine (LANTUS SOLOSTAR) 100 UNIT/ML Solostar Pen INJECT  45 UNITS SUBCUTANEOUSLY AT BEDTIME 45 mL 3  . Insulin Pen Needle (DROPLET PEN NEEDLES) 31G X 6 MM MISC USE PEN NEEDLES WHEN TAKING LANTUS AT BEDTIME 100 each 1  . isosorbide mononitrate (IMDUR) 30 MG 24 hr tablet TAKE 1 TABLET EVERY DAY 90 tablet 3  . lisinopril (PRINIVIL,ZESTRIL) 20 MG tablet Take 1 tablet (20 mg total) by mouth daily. 90 tablet 3  . metFORMIN (GLUCOPHAGE) 1000 MG tablet Take 1 tablet (1,000 mg total) by mouth 2 (two) times daily. 180 tablet 3  . metoprolol tartrate (LOPRESSOR) 25 MG tablet TAKE 1/2 TABLET TWICE DAILY 90 tablet 3  . nitroGLYCERIN (NITROSTAT) 0.4 MG SL tablet Place 1 tablet (0.4 mg total) every 5 (five) minutes as needed under the tongue for chest pain. 25 tablet 3  . pravastatin (PRAVACHOL) 80 MG tablet TAKE 1 TABLET EVERY EVENING 90 tablet 1  . ticagrelor (BRILINTA) 90 MG TABS tablet Take 1 tablet (90 mg total) by mouth 2 (two) times daily. 180 tablet 3  . triamcinolone (NASACORT) 55 MCG/ACT AERO nasal inhaler USE ONE SPRAYS EACH NOSTRIL EVERY DAY as needed    .  TRUEPLUS LANCETS 30G MISC Check blood sugar twice daily and as directed. Dx E11.65 200 each 1   No current facility-administered medications on file prior to visit.      Review of Systems  Constitutional: Positive for activity change, appetite change and fatigue. Negative for chills, diaphoresis and fever.  HENT: Positive for congestion, postnasal drip, rhinorrhea, sinus pressure, sneezing, sore throat and voice change. Negative for ear pain.   Eyes: Negative for pain and discharge.  Respiratory: Positive for cough and wheezing. Negative for chest tightness, shortness of breath and stridor.        Wheezes during cough only  Cardiovascular: Negative for chest pain.  Gastrointestinal: Positive for diarrhea. Negative for nausea and vomiting.  Genitourinary: Negative for frequency, hematuria and urgency.  Musculoskeletal: Negative for arthralgias and myalgias.  Skin: Negative for rash.  Neurological: Positive for headaches. Negative for dizziness, weakness and light-headedness.  Psychiatric/Behavioral: Negative for confusion and dysphoric mood.       Objective:   Physical Exam Constitutional:      General: He is not in acute distress.    Appearance: Normal appearance. He is well-developed. He is obese. He is not ill-appearing, toxic-appearing or diaphoretic.  HENT:     Head: Normocephalic and atraumatic.     Comments: Nares are injected and congested    No sinus tenderness    Right Ear: Tympanic membrane, ear canal and external ear normal.     Left Ear: Tympanic membrane, ear canal and external ear normal.     Nose: Congestion and rhinorrhea present.     Mouth/Throat:     Mouth: Mucous membranes are moist.     Pharynx: Oropharynx is clear. No oropharyngeal exudate or posterior oropharyngeal erythema.     Comments: Clear pnd  Eyes:     General: No scleral icterus.       Right eye: No discharge.        Left eye: No discharge.     Conjunctiva/sclera: Conjunctivae normal.      Pupils: Pupils are equal, round, and reactive to light.     Comments: No conj injection today  Neck:     Musculoskeletal: Normal range of motion and neck supple. No neck rigidity.  Cardiovascular:     Rate and Rhythm: Normal rate.     Heart sounds: Normal heart sounds.  Pulmonary:     Effort: Pulmonary effort is normal. No respiratory distress.     Breath sounds: Normal breath sounds. No stridor. No wheezing, rhonchi or rales.     Comments: Good air exch Upper airway sounds No rales or rhonchi Scant wheeze only on forced exp  Lateral chest wall tenderness bilat  No crepitus or skin change Chest:     Chest wall: Tenderness present.  Abdominal:     General: Abdomen is flat. Bowel sounds are normal. There is no distension.     Tenderness: There is no abdominal tenderness.  Lymphadenopathy:     Cervical: No cervical adenopathy.  Skin:    General: Skin is warm and dry.     Capillary Refill: Capillary refill takes less than 2 seconds.     Findings: No rash.  Neurological:     Mental Status: He is alert.     Cranial Nerves: No cranial nerve deficit.     Deep Tendon Reflexes: Reflexes normal.  Psychiatric:        Mood and Affect: Mood normal.           Assessment & Plan:   Problem List Items Addressed This Visit      Respiratory   CAP (community acquired pneumonia) - Primary    Reviewed hospital records, lab results and studies in detail  Gradually clinically improving (bothersome cough)  Reassuring exam  Will repeat z pack given severity  tussionex refilled Tessalon prn Symptom care Update if not starting to improve in a week or if worsening        Relevant Medications   benzonatate (TESSALON) 200 MG capsule   chlorpheniramine-HYDROcodone (TUSSIONEX PENNKINETIC ER) 10-8 MG/5ML SUER

## 2018-07-30 DIAGNOSIS — L570 Actinic keratosis: Secondary | ICD-10-CM | POA: Diagnosis not present

## 2018-07-30 DIAGNOSIS — Z09 Encounter for follow-up examination after completed treatment for conditions other than malignant neoplasm: Secondary | ICD-10-CM | POA: Diagnosis not present

## 2018-07-30 DIAGNOSIS — D2261 Melanocytic nevi of right upper limb, including shoulder: Secondary | ICD-10-CM | POA: Diagnosis not present

## 2018-07-30 DIAGNOSIS — D485 Neoplasm of uncertain behavior of skin: Secondary | ICD-10-CM | POA: Diagnosis not present

## 2018-07-30 DIAGNOSIS — D2262 Melanocytic nevi of left upper limb, including shoulder: Secondary | ICD-10-CM | POA: Diagnosis not present

## 2018-07-30 DIAGNOSIS — D225 Melanocytic nevi of trunk: Secondary | ICD-10-CM | POA: Diagnosis not present

## 2018-07-30 DIAGNOSIS — Z85828 Personal history of other malignant neoplasm of skin: Secondary | ICD-10-CM | POA: Diagnosis not present

## 2018-07-30 DIAGNOSIS — D0462 Carcinoma in situ of skin of left upper limb, including shoulder: Secondary | ICD-10-CM | POA: Diagnosis not present

## 2018-07-30 DIAGNOSIS — X32XXXA Exposure to sunlight, initial encounter: Secondary | ICD-10-CM | POA: Diagnosis not present

## 2018-07-30 DIAGNOSIS — C44629 Squamous cell carcinoma of skin of left upper limb, including shoulder: Secondary | ICD-10-CM | POA: Diagnosis not present

## 2018-08-06 DIAGNOSIS — D0462 Carcinoma in situ of skin of left upper limb, including shoulder: Secondary | ICD-10-CM | POA: Diagnosis not present

## 2018-08-06 DIAGNOSIS — G4733 Obstructive sleep apnea (adult) (pediatric): Secondary | ICD-10-CM | POA: Diagnosis not present

## 2018-08-06 DIAGNOSIS — R0689 Other abnormalities of breathing: Secondary | ICD-10-CM | POA: Diagnosis not present

## 2018-08-07 DIAGNOSIS — C44329 Squamous cell carcinoma of skin of other parts of face: Secondary | ICD-10-CM | POA: Diagnosis not present

## 2018-09-10 NOTE — Progress Notes (Deleted)
HPI: FU CAD.  Cardiac catheterization in the past has revealed significant three-vessel coronary artery disease.  I reviewed those films with Dr. Percival Spanish and Dr. Lia Foyer and we felt medical therapy best option (pt also wanted to avoid CABG). Patient admitted December 2018 and ruled in for non-ST elevation myocardial infarction.  Patient underwent repeat catheterization revealing severe three-vessel coronary disease.  Left-sided disease was unchanged compared to previous catheterizations and the culprit was felt to be a new RCA lesion and he had PCI with DES.  Echocardiogram December 2018 showed normal LV systolic function and mild left atrial enlargement.  Since he was last seen,   Current Outpatient Medications  Medication Sig Dispense Refill  . aspirin 81 MG tablet Take 81 mg by mouth daily.      . benzonatate (TESSALON) 200 MG capsule Take 1 capsule (200 mg total) by mouth 3 (three) times daily as needed. Do not bite pill 30 capsule 1  . Blood Glucose Monitoring Suppl (TRUE METRIX AIR GLUCOSE METER) W/DEVICE KIT 1 kit by Other route 2 (two) times daily. Check blood sugar twice daily and as directed. Dx E11.65 1 kit 0  . cetirizine (ZYRTEC) 10 MG tablet TAKE 1 TABLET EVERY DAY 90 tablet 1  . chlorpheniramine-HYDROcodone (TUSSIONEX PENNKINETIC ER) 10-8 MG/5ML SUER Take 5 mLs by mouth every 12 (twelve) hours as needed. 115 mL 0  . Coenzyme Q10 (CO Q 10 PO) Take 200 mg by mouth daily.    . Cyanocobalamin (VITAMIN B12 PO) Take 1,000 mg by mouth 2 (two) times a week.    . fluorouracil (EFUDEX) 5 % cream Apply 2 application topically daily as needed (skin lesions).     Marland Kitchen glipiZIDE (GLUCOTROL) 10 MG tablet Take 1 tablet (10 mg total) by mouth 2 (two) times daily with a meal. 180 tablet 3  . glucose blood (TRUE METRIX BLOOD GLUCOSE TEST) test strip Use to check blood sugar twice a day. Dx Code E11.65 300 each 1  . hydrochlorothiazide (HYDRODIURIL) 12.5 MG tablet Take 1 tablet (12.5 mg total) by  mouth daily. 90 tablet 3  . Insulin Glargine (LANTUS SOLOSTAR) 100 UNIT/ML Solostar Pen INJECT  45 UNITS SUBCUTANEOUSLY AT BEDTIME 45 mL 3  . Insulin Pen Needle (DROPLET PEN NEEDLES) 31G X 6 MM MISC USE PEN NEEDLES WHEN TAKING LANTUS AT BEDTIME 100 each 1  . isosorbide mononitrate (IMDUR) 30 MG 24 hr tablet TAKE 1 TABLET EVERY DAY 90 tablet 3  . lisinopril (PRINIVIL,ZESTRIL) 20 MG tablet Take 1 tablet (20 mg total) by mouth daily. 90 tablet 3  . metFORMIN (GLUCOPHAGE) 1000 MG tablet Take 1 tablet (1,000 mg total) by mouth 2 (two) times daily. 180 tablet 3  . metoprolol tartrate (LOPRESSOR) 25 MG tablet TAKE 1/2 TABLET TWICE DAILY 90 tablet 3  . nitroGLYCERIN (NITROSTAT) 0.4 MG SL tablet Place 1 tablet (0.4 mg total) every 5 (five) minutes as needed under the tongue for chest pain. 25 tablet 3  . pravastatin (PRAVACHOL) 80 MG tablet TAKE 1 TABLET EVERY EVENING 90 tablet 1  . ticagrelor (BRILINTA) 90 MG TABS tablet Take 1 tablet (90 mg total) by mouth 2 (two) times daily. 180 tablet 3  . triamcinolone (NASACORT) 55 MCG/ACT AERO nasal inhaler USE ONE SPRAYS EACH NOSTRIL EVERY DAY as needed    . TRUEPLUS LANCETS 30G MISC Check blood sugar twice daily and as directed. Dx E11.65 200 each 1   No current facility-administered medications for this visit.  Past Medical History:  Diagnosis Date  . AC (acromioclavicular) joint bone spurs    lt ankle  . Allergy    allergic rhinitis  . Arthritis    OA  . BPH (benign prostatic hyperplasia)    microwave tx of prostate  . CAD (coronary artery disease)    a. s/p cath in 2012 showing 70-75% LAD stenosis, 75% D1 stenosis, 75% OM1, 60-70% RCA stenosis, and 80% PDA --> medical therapy pursued b. similar findings by cath in 2016; 06/2017 DES to distal RCA  . Chronic upper back pain   . Diabetes mellitus    type II x8 years  . GERD (gastroesophageal reflux disease)   . HLD (hyperlipidemia)    10/12: TC 208, TG 213, HDL 36, LDL 129  . Hx of skin  cancer, basal cell    many skin cancers removed/under constant treatment.  . Hypertension   . NSTEMI (non-ST elevated myocardial infarction) ([place]) 06/18/2017   DES to distal RCA  . Obstructive sleep apnea on CPAP   . Scoliosis     Past Surgical History:  Procedure Laterality Date  . BREAST SURGERY  2009   breast lump removed benign  . CARDIAC CATHETERIZATION N/A 03/15/2015   Procedure: Left Heart Cath and Coronary Angiography;  Surgeon: Belva Crome, MD;  Location: [facility];  Service: Cardiovascular;  Laterality: N/A;  . CORONARY STENT INTERVENTION N/A 06/18/2017   Procedure: CORONARY STENT INTERVENTION;  Surgeon: Jettie Booze, MD;  Location: [facility];  Service: Cardiovascular;  Laterality: N/A;  RCA  . LEFT HEART CATH AND CORONARY ANGIOGRAPHY N/A 06/18/2017   Procedure: LEFT HEART CATH AND CORONARY ANGIOGRAPHY;  Surgeon: Jettie Booze, MD;  Location: [facility];  Service: Cardiovascular;  Laterality: N/A;  . ULTRASOUND GUIDANCE FOR VASCULAR ACCESS  06/18/2017   Procedure: Ultrasound Guidance For Vascular Access;  Surgeon: Jettie Booze, MD;  Location: [facility];  Service: Cardiovascular;;  right radial, right femoral    Social History   Socioeconomic History  . Marital status: Married    Spouse name: Not on file  . Number of children: Not on file  . Years of education: Not on file  . Highest education level: Not on file  Occupational History  . Not on file  Social Needs  . Financial resource strain: Not on file  . Food insecurity:    Worry: Not on file    Inability: Not on file  . Transportation needs:    Medical: Not on file    Non-medical: Not on file  Tobacco Use  . Smoking status: Never Smoker  . Smokeless tobacco: Never Used  Substance and Sexual Activity  . Alcohol use: No    Alcohol/week: 0.0 standard drinks  . Drug use: No  . Sexual activity: Not Currently  Lifestyle  . Physical activity:    Days  per week: Not on file    Minutes per session: Not on file  . Stress: Not on file  Relationships  . Social connections:    Talks on phone: Not on file    Gets together: Not on file    Attends religious service: Not on file    Active member of club or organization: Not on file    Attends meetings of clubs or organizations: Not on file    Relationship status: Not on file  . Intimate partner violence:    Fear of current or ex partner: Not on file  Emotionally abused: Not on file    Physically abused: Not on file    Forced sexual activity: Not on file  Other Topics Concern  . Not on file  Social History Narrative   Married    Physically active hard of hearing    Family History  Problem Relation Age of Onset  . Heart attack Brother   . Colon cancer Neg Hx     ROS: no fevers or chills, productive cough, hemoptysis, dysphasia, odynophagia, melena, hematochezia, dysuria, hematuria, rash, seizure activity, orthopnea, PND, pedal edema, claudication. Remaining systems are negative.  Physical Exam: Well-developed well-nourished in no acute distress.  Skin is warm and dry.  HEENT is normal.  Neck is supple.  Chest is clear to auscultation with normal expansion.  Cardiovascular exam is regular rate and rhythm.  Abdominal exam nontender or distended. No masses palpated. Extremities show no edema. neuro grossly intact  ECG- personally reviewed  A/P  1  Kirk Ruths, MD

## 2018-09-16 ENCOUNTER — Telehealth: Payer: Self-pay | Admitting: *Deleted

## 2018-09-16 NOTE — Telephone Encounter (Signed)
Spoke with pt, he will reschedule to next available.

## 2018-09-18 ENCOUNTER — Ambulatory Visit: Payer: Medicare HMO | Admitting: Cardiology

## 2018-09-23 NOTE — Telephone Encounter (Signed)
Follow up scheduled

## 2018-10-08 ENCOUNTER — Other Ambulatory Visit: Payer: Self-pay | Admitting: Cardiology

## 2018-10-08 NOTE — Telephone Encounter (Signed)
Pravastatin refilled.  

## 2018-10-17 ENCOUNTER — Other Ambulatory Visit: Payer: Self-pay | Admitting: Cardiology

## 2018-10-17 MED ORDER — TICAGRELOR 90 MG PO TABS: 90.0000 mg | ORAL_TABLET | Freq: Two times a day (BID) | ORAL | 1 refills | Status: DC

## 2018-10-17 NOTE — Telephone Encounter (Signed)
Brilinta refilled. 

## 2018-10-17 NOTE — Telephone Encounter (Signed)
°*  STAT* If patient is at the pharmacy, call can be transferred to refill team.   1. Which medications need to be refilled? (please list name of each medication and dose if known)  ticagrelor (BRILINTA) 90 MG TABS tablet  2. Which pharmacy/location (including street and city if local pharmacy) is medication to be sent to?  [place], [place]  3. Do they need a 30 day or 90 day supply? 90  Pt needs a new script sent to the mail order pharmacy. He was given samples in the past, and is close to running out of the samples

## 2018-10-22 ENCOUNTER — Ambulatory Visit (INDEPENDENT_AMBULATORY_CARE_PROVIDER_SITE_OTHER): Payer: Medicare HMO | Admitting: Family Medicine

## 2018-10-22 ENCOUNTER — Other Ambulatory Visit: Payer: Self-pay

## 2018-10-22 ENCOUNTER — Encounter: Payer: Self-pay | Admitting: Family Medicine

## 2018-10-22 VITALS — BP 142/80 | HR 68 | Temp 98.1°F | Wt 259.0 lb

## 2018-10-22 DIAGNOSIS — R58 Hemorrhage, not elsewhere classified: Secondary | ICD-10-CM | POA: Diagnosis not present

## 2018-10-22 NOTE — Assessment & Plan Note (Signed)
Ecchymosis of L medial foot at bottom of heel at fascial plane (old appearing/resolving)  It looks as if he has had trauma and this is dependent edema and bruising, also cannot r/o tight boot with friction injury  Not significantly tender/nl rom  Enc elevation/cool compress / change of foot wear  Also enc he wears support socks for at least 2 weeks If pain develops would consider imaging  Update if not starting to improve in a week or if worsening

## 2018-10-22 NOTE — Progress Notes (Signed)
Subjective:    Patient ID: Ryan Garcia, male    DOB: Nov 27, 1933, 85 y.o.   MRN: [id number]  HPI Here for a bruised area on L foot/heel for 3-4 weeks  Bruised area around top of his heel  Improved and then worsened again  Not aware of an injury  Took shoes off one day and noticed it  Has not changed size   No open areas or drainage   Has been wearing the same boots for a while  Does not feel tight   Does not note any increased swelling of feet or ankles    H/o diabetes Lab Results  Component Value Date   HGBA1C 7.2 (H) 04/28/2018   Takes 81 mg asa daily  Wt Readings from Last 3 Encounters:  10/22/18 259 lb (117.5 kg)  07/09/18 248 lb 4 oz (112.6 kg)  07/03/18 250 lb (113.4 kg)   35.13 kg/m    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 Ecchymosis 10/22/2018  . CAP (community acquired pneumonia) 07/09/2018  . Obesity (BMI 30-39.9) 07/09/2018  . HOH (hard of hearing) 03/31/2018  . Bleeding gums 11/22/2017  . Uncontrolled type 2 diabetes mellitus with hyperglycemia ([place]) 10/28/2017  . Subclinical hypothyroidism 10/28/2017  . Gross hematuria 09/23/2017  . Scoliosis   . Obstructive sleep apnea on CPAP   . Hx of skin cancer, basal cell   . Dyslipidemia, goal LDL below 70   . GERD (gastroesophageal reflux disease)   . Chronic upper back pain   . Arthritis   . Allergy   . AC (acromioclavicular) joint bone spurs   . NSTEMI (non-ST elevated myocardial infarction) ([place]) 06/18/2017  . Actinic keratoses 04/29/2017  . Positive colorectal cancer screening using Cologuard test 10/31/2016  . BPH (benign prostatic hyperplasia) 09/04/2016  . B12 deficiency 11/22/2015  . Routine general medical examination at a health care facility 08/26/2015  . Abnormal nuclear cardiac imaging test   . Dyspnea on exertion 11/27/2013  . Encounter for examination of normal  volunteer in research study 05/29/2013  . Prostate cancer screening 07/15/2012  . CAD S/P percutaneous coronary angioplasty 05/17/2011  . Nonspecific abnormal results of cardiovascular function study 04/30/2011  . Abnormal EKG 04/06/2011  . Left arm pain 04/06/2011  . Right low back pain 01/13/2010  . Insulin dependent diabetes mellitus ([place]) 10/04/2006  . ERECTILE DYSFUNCTION 10/04/2006  . Essential hypertension 10/04/2006  . ALLERGIC RHINITIS 10/04/2006  . OSTEOARTHRITIS 10/04/2006  . Sleep apnea 10/04/2006  . EDEMA 10/04/2006  . SKIN CANCER, HX OF 10/04/2006   Past Medical History:  Diagnosis Date  . AC (acromioclavicular) joint bone spurs    lt ankle  . Allergy    allergic rhinitis  . Arthritis    OA  . BPH (benign prostatic hyperplasia)    microwave tx of prostate  . CAD (coronary artery disease)    a. s/p cath in 2012 showing 70-75% LAD stenosis, 75% D1 stenosis, 75% OM1, 60-70% RCA stenosis, and 80% PDA --> medical therapy pursued b. similar findings by cath in 2016; 06/2017 DES to distal RCA  . Chronic upper back pain   . Diabetes mellitus    type II x8 years  . GERD (gastroesophageal reflux disease)   . HLD (hyperlipidemia)    10/12: TC 208, TG 213, HDL 36, LDL 129  .  Hx of skin cancer, basal cell    many skin cancers removed/under constant treatment.  . Hypertension   . NSTEMI (non-ST elevated myocardial infarction) ([place]) 06/18/2017   DES to distal RCA  . Obstructive sleep apnea on CPAP   . Scoliosis    Past Surgical History:  Procedure Laterality Date  . BREAST SURGERY  2009   breast lump removed benign  . CARDIAC CATHETERIZATION N/A 03/15/2015   Procedure: Left Heart Cath and Coronary Angiography;  Surgeon: Belva Crome, MD;  Location: [facility];  Service: Cardiovascular;  Laterality: N/A;  . CORONARY STENT INTERVENTION N/A 06/18/2017   Procedure: CORONARY STENT INTERVENTION;  Surgeon: Jettie Booze, MD;  Location: [facility];   Service: Cardiovascular;  Laterality: N/A;  RCA  . LEFT HEART CATH AND CORONARY ANGIOGRAPHY N/A 06/18/2017   Procedure: LEFT HEART CATH AND CORONARY ANGIOGRAPHY;  Surgeon: Jettie Booze, MD;  Location: [facility];  Service: Cardiovascular;  Laterality: N/A;  . ULTRASOUND GUIDANCE FOR VASCULAR ACCESS  06/18/2017   Procedure: Ultrasound Guidance For Vascular Access;  Surgeon: Jettie Booze, MD;  Location: [facility];  Service: Cardiovascular;;  right radial, right femoral   Social History   Tobacco Use  . Smoking status: Never Smoker  . Smokeless tobacco: Never Used  Substance Use Topics  . Alcohol use: No    Alcohol/week: 0.0 standard drinks  . Drug use: No   Family History  Problem Relation Age of Onset  . Heart attack Brother   . Colon cancer Neg Hx    Allergies  Allergen Reactions  . Loratadine     REACTION: not effective  . Simvastatin     REACTION: intolerant   Current Outpatient Medications on File Prior to Visit  Medication Sig Dispense Refill  . aspirin 81 MG tablet Take 81 mg by mouth daily.      . Blood Glucose Monitoring Suppl (TRUE METRIX AIR GLUCOSE METER) W/DEVICE KIT 1 kit by Other route 2 (two) times daily. Check blood sugar twice daily and as directed. Dx E11.65 1 kit 0  . cetirizine (ZYRTEC) 10 MG tablet TAKE 1 TABLET EVERY DAY 90 tablet 1  . Coenzyme Q10 (CO Q 10 PO) Take 200 mg by mouth daily.    . Cyanocobalamin (VITAMIN B12 PO) Take 1,000 mg by mouth 2 (two) times a week.    Marland Kitchen glipiZIDE (GLUCOTROL) 10 MG tablet Take 1 tablet (10 mg total) by mouth 2 (two) times daily with a meal. 180 tablet 3  . glucose blood (TRUE METRIX BLOOD GLUCOSE TEST) test strip Use to check blood sugar twice a day. Dx Code E11.65 300 each 1  . hydrochlorothiazide (HYDRODIURIL) 12.5 MG tablet Take 1 tablet (12.5 mg total) by mouth daily. 90 tablet 3  . Insulin Glargine (LANTUS SOLOSTAR) 100 UNIT/ML Solostar Pen INJECT  45 UNITS SUBCUTANEOUSLY AT BEDTIME  45 mL 3  . Insulin Pen Needle (DROPLET PEN NEEDLES) 31G X 6 MM MISC USE PEN NEEDLES WHEN TAKING LANTUS AT BEDTIME 100 each 1  . isosorbide mononitrate (IMDUR) 30 MG 24 hr tablet TAKE 1 TABLET EVERY DAY 90 tablet 3  . lisinopril (PRINIVIL,ZESTRIL) 20 MG tablet Take 1 tablet (20 mg total) by mouth daily. 90 tablet 3  . metFORMIN (GLUCOPHAGE) 1000 MG tablet Take 1 tablet (1,000 mg total) by mouth 2 (two) times daily. 180 tablet 3  . metoprolol tartrate (LOPRESSOR) 25 MG tablet TAKE 1/2 TABLET TWICE DAILY 90 tablet  3  . nitroGLYCERIN (NITROSTAT) 0.4 MG SL tablet Place 1 tablet (0.4 mg total) every 5 (five) minutes as needed under the tongue for chest pain. 25 tablet 3  . pravastatin (PRAVACHOL) 80 MG tablet TAKE 1 TABLET EVERY EVENING 90 tablet 1  . ticagrelor (BRILINTA) 90 MG TABS tablet Take 1 tablet (90 mg total) by mouth 2 (two) times daily. 180 tablet 1  . triamcinolone (NASACORT) 55 MCG/ACT AERO nasal inhaler USE ONE SPRAYS EACH NOSTRIL EVERY DAY as needed    . TRUEPLUS LANCETS 30G MISC Check blood sugar twice daily and as directed. Dx E11.65 200 each 1  . fluorouracil (EFUDEX) 5 % cream Apply 2 application topically daily as needed (skin lesions).      No current facility-administered medications on file prior to visit.      Review of Systems  Constitutional: Negative for activity change, appetite change, fatigue, fever and unexpected weight change.  HENT: Negative for congestion, rhinorrhea, sore throat and trouble swallowing.   Eyes: Negative for pain, redness, itching and visual disturbance.  Respiratory: Negative for cough, chest tightness, shortness of breath and wheezing.   Cardiovascular: Negative for chest pain and palpitations.  Gastrointestinal: Negative for abdominal pain, blood in stool, constipation, diarrhea and nausea.  Endocrine: Negative for cold intolerance, heat intolerance, polydipsia and polyuria.  Genitourinary: Negative for difficulty urinating, dysuria, frequency  and urgency.  Musculoskeletal: Positive for arthralgias and joint swelling. Negative for gait problem and myalgias.  Skin: Negative for pallor and rash.  Neurological: Negative for dizziness, tremors, weakness, numbness and headaches.  Hematological: Negative for adenopathy. Does not bruise/bleed easily.  Psychiatric/Behavioral: Negative for decreased concentration and dysphoric mood. The patient is not nervous/anxious.        Objective:   Physical Exam Constitutional:      General: He is not in acute distress.    Appearance: Normal appearance. He is obese. He is not ill-appearing.  HENT:     Head: Normocephalic and atraumatic.  Eyes:     Extraocular Movements: Extraocular movements intact.     Conjunctiva/sclera: Conjunctivae normal.     Pupils: Pupils are equal, round, and reactive to light.  Neck:     Musculoskeletal: Normal range of motion and neck supple.  Cardiovascular:     Rate and Rhythm: Normal rate and regular rhythm.     Pulses: Normal pulses.     Heart sounds: Normal heart sounds.  Pulmonary:     Effort: Pulmonary effort is normal. No respiratory distress.     Breath sounds: Normal breath sounds. No wheezing or rales.  Musculoskeletal:        General: Swelling, tenderness and signs of injury present.     Left ankle: He exhibits swelling and ecchymosis. He exhibits normal range of motion, no deformity and normal pulse. Tenderness. Medial malleolus tenderness found. No head of 5th metatarsal and no proximal fibula tenderness found. Achilles tendon normal.     Comments: Diffusely swollen L ankle - moreso medially  A line of old/resolving ecchymosis lies at bottom of foot along the fascial plane No foot/bony tenderness Very slight tenderness of L medial malleolus area - with nl rom and gait No crepitus   Lymphadenopathy:     Cervical: No cervical adenopathy.  Skin:    Comments: Ecchymosis at medial lower foot under heel/ at fascial plane  Neurological:     Mental  Status: He is alert.     Sensory: Sensation is intact.     Motor: Motor function is  intact. No weakness or atrophy.     Gait: Gait is intact.     Comments: Nl sensation to light touch -both feet            Assessment & Plan:   Problem List Items Addressed This Visit      Other   Ecchymosis - Primary    Ecchymosis of L medial foot at bottom of heel at fascial plane (old appearing/resolving)  It looks as if he has had trauma and this is dependent edema and bruising, also cannot r/o tight boot with friction injury  Not significantly tender/nl rom  Enc elevation/cool compress / change of foot wear  Also enc he wears support socks for at least 2 weeks If pain develops would consider imaging  Update if not starting to improve in a week or if worsening

## 2018-10-22 NOTE — Patient Instructions (Signed)
You have bruising on the left ankle - I think you may have had injury to the area you do not remember , or your boot rubs the area that is already swollen  Bruising can take months to get better but we want to watch it   Wear your support socks if you can to control the swelling during the day  Elevate that foot when you sit  Switch out of the boots you are wearing and try athletic shoe or a different boot for at least 2 weeks   You can also use a cold compress on the ankle to help swelling   Watch for pain / increased swelling or redness and let me know

## 2018-10-27 ENCOUNTER — Other Ambulatory Visit: Payer: Self-pay | Admitting: Family Medicine

## 2018-10-28 ENCOUNTER — Telehealth: Payer: Self-pay

## 2018-10-28 NOTE — Telephone Encounter (Signed)
Left a detailed message for the patient about switching his upcoming appointment to a virtual video visit if possible due to COVID-19. Stated that patient can give our office a call back and someone will be glad to assist with switching the appointment.

## 2018-10-29 ENCOUNTER — Telehealth: Payer: Self-pay

## 2018-10-29 ENCOUNTER — Ambulatory Visit: Payer: Medicare HMO

## 2018-10-29 NOTE — Telephone Encounter (Signed)
Virtual Visit Pre-Appointment Phone Call  "(Name), I am calling you today to discuss your upcoming appointment. We are currently trying to limit exposure to the virus that causes COVID-19 by seeing patients at home rather than in the office."  1. "What is the BEST phone number to call the day of the visit?" - include this in appointment notes  2. "Do you have or have access to (through a family member/friend) a smartphone with video capability that we can use for your visit?" a. If yes - list this number in appt notes as "cell" (if different from BEST phone #) and list the appointment type as a VIDEO visit in appointment notes b. If no - list the appointment type as a PHONE visit in appointment notes  3. Confirm consent - "In the setting of the current Covid19 crisis, you are scheduled for a TELEPHONE visit with Dr. Stanford Breed on 10/31/2018 at 3:20PM.  Just as we do with many in-office visits, in order for you to participate in this visit, we must obtain consent.  If you'd like, I can send this to your mychart (if signed up) or email for you to review.  Otherwise, I can obtain your verbal consent now.  All virtual visits are billed to your insurance company just like a normal visit would be.  By agreeing to a virtual visit, we'd like you to understand that the technology does not allow for your provider to perform an examination, and thus may limit your provider's ability to fully assess your condition. If your provider identifies any concerns that need to be evaluated in person, we will make arrangements to do so.  Finally, though the technology is pretty good, we cannot assure that it will always work on either your or our end, and in the setting of a video visit, we may have to convert it to a phone-only visit.  In either situation, we cannot ensure that we have a secure connection.  Are you willing to proceed?" STAFF: Did the patient verbally acknowledge consent to telehealth visit? Document YES/NO  here: YES  4. Advise patient to be prepared - "Two hours prior to your appointment, go ahead and check your blood pressure, pulse, oxygen saturation, and your weight (if you have the equipment to check those) and write them all down. When your visit starts, your provider will ask you for this information. If you have an Apple Watch or Kardia device, please plan to have heart rate information ready on the day of your appointment. Please have a pen and paper handy nearby the day of the visit as well."  5. Give patient instructions for MyChart download to smartphone OR Doximity/Doxy.me as below if video visit (depending on what platform provider is using)  6. Inform patient they will receive a phone call 15 minutes prior to their appointment time (may be from unknown caller ID) so they should be prepared to answer    TELEPHONE CALL NOTE  Ryan Garcia has been deemed a candidate for a follow-up tele-health visit to limit community exposure during the Covid-19 pandemic. I spoke with the patient via phone to ensure availability of phone/video source, confirm preferred email & phone number, and discuss instructions and expectations.  I reminded Ryan Garcia to be prepared with any vital sign and/or heart rhythm information that could potentially be obtained via home monitoring, at the time of his visit. I reminded Ryan Garcia to expect a phone call prior to his visit.  [name], [place] 10/29/2018 11:51 AM   INSTRUCTIONS FOR DOWNLOADING THE MYCHART APP TO SMARTPHONE  - The patient must first make sure to have activated MyChart and know their login information - If Apple, go to [employer] and type in MyChart in the search bar and download the app. If Android, ask patient to go to [employer] and type in [facility] in the search bar and download the app. The app is free but as with any other app downloads, their phone may require them to verify saved payment information or Apple/Android  password.  - The patient will need to then log into the app with their MyChart username and password, and select [place] [facility] as their healthcare provider to link the account. When it is time for your visit, go to the MyChart app, find appointments, and click Begin Video Visit. Be sure to Select Allow for your device to access the Microphone and Camera for your visit. You will then be connected, and your provider will be with you shortly.  **If they have any issues connecting, or need assistance please contact MyChart service desk [phone])**  **If using a computer, in order to ensure the best quality for their visit they will need to use either of the following Internet Browsers: [employer], or Google Chrome**  IF USING DOXIMITY or DOXY.ME - The patient will receive a link just prior to their visit by text.     FULL LENGTH CONSENT FOR TELE-HEALTH VISIT   I hereby voluntarily request, consent and authorize [facility] [place] and its employed or contracted physicians, physician assistants, nurse practitioners or other licensed health care professionals (the Practitioner), to provide me with telemedicine health care services (the "Services") as deemed necessary by the treating Practitioner. I acknowledge and consent to receive the Services by the Practitioner via telemedicine. I understand that the telemedicine visit will involve communicating with the Practitioner through live audiovisual communication technology and the disclosure of certain medical information by electronic transmission. I acknowledge that I have been given the opportunity to request an in-person assessment or other available alternative prior to the telemedicine visit and am voluntarily participating in the telemedicine visit.  I understand that I have the right to withhold or withdraw my consent to the use of telemedicine in the course of my care at any time, without affecting my right to future care or treatment,  and that the Practitioner or I may terminate the telemedicine visit at any time. I understand that I have the right to inspect all information obtained and/or recorded in the course of the telemedicine visit and may receive copies of available information for a reasonable fee.  I understand that some of the potential risks of receiving the Services via telemedicine include:  Marland Kitchen Delay or interruption in medical evaluation due to technological equipment failure or disruption; . Information transmitted may not be sufficient (e.g. poor resolution of images) to allow for appropriate medical decision making by the Practitioner; and/or  . In rare instances, security protocols could fail, causing a breach of personal health information.  Furthermore, I acknowledge that it is my responsibility to provide information about my medical history, conditions and care that is complete and accurate to the best of my ability. I acknowledge that Practitioner's advice, recommendations, and/or decision may be based on factors not within their control, such as incomplete or inaccurate data provided by me or distortions of diagnostic images or specimens that may result from electronic transmissions. I understand that the  practice of medicine is not an Chief Strategy Officer and that Practitioner makes no warranties or guarantees regarding treatment outcomes. I acknowledge that I will receive a copy of this consent concurrently upon execution via email to the email address I last provided but may also request a printed copy by calling the office of [facility].    I understand that my insurance will be billed for this visit.   I have read or had this consent read to me. . I understand the contents of this consent, which adequately explains the benefits and risks of the Services being provided via telemedicine.  . I have been provided ample opportunity to ask questions regarding this consent and the Services and have had my questions  answered to my satisfaction. . I give my informed consent for the services to be provided through the use of telemedicine in my medical care  By participating in this telemedicine visit I agree to the above.

## 2018-10-29 NOTE — Telephone Encounter (Signed)
Follow up    Patients wife is returning call. They have some additional questions in addition she said they do not have a smart phone. Please call.

## 2018-10-29 NOTE — Telephone Encounter (Addendum)
Patient verbally consented for tele-health visits with [facility] and understands that her insurance company will be billed for the encounter.  Per wife, patient does not have a smart-phone and can only do phone virtual visits. Aware to check vitals prior to visit.

## 2018-10-29 NOTE — Progress Notes (Signed)
Virtual Visit via Video Note changed to phone visit as patient did not have a smart phone   This visit type was conducted due to national recommendations for restrictions regarding the COVID-19 Pandemic (e.g. social distancing) in an effort to limit this patient's exposure and mitigate transmission in our community.  Due to his co-morbid illnesses, this patient is at least at moderate risk for complications without adequate follow up.  This format is felt to be most appropriate for this patient at this time.  All issues noted in this document were discussed and addressed.  A limited physical exam was performed with this format.  Please refer to the patient's chart for his consent to telehealth for [facility].   Evaluation Performed:  Follow-up visit  Date:  10/31/2018   ID:  Ryan Garcia, DOB Jan 23, 1934, MRN [id number]  Patient Location: Home Provider Location: Home  PCP:  Abner Greenspan, MD  Cardiologist:  Kirk Ruths, MD   Chief Complaint:  FU CAD  History of Present Illness:    FU CAD.  Cardiac catheterization in the past has revealed significant three-vessel coronary artery disease.  I reviewed those films with Dr. Percival Spanish and Dr. Lia Foyer and we felt medical therapy best option (pt also wanted to avoid CABG). Patient admitted December 2018 and ruled in for non-ST elevation myocardial infarction.  Patient underwent repeat catheterization revealing severe three-vessel coronary disease.  Left-sided disease was unchanged compared to previous catheterizations and the culprit was felt to be a new RCA lesion and he had PCI with DES.  Echocardiogram December 2018 showed normal LV systolic function and mild left atrial enlargement.  Since last seen, patient has dyspnea on exertion unchanged.  No orthopnea or PND.  Occasional minimal pedal edema towards the end of the day.  No chest pain.  The patient does not have symptoms concerning for COVID-19 infection (fever, chills, cough, or new  shortness of breath).    Past Medical History:  Diagnosis Date  . AC (acromioclavicular) joint bone spurs    lt ankle  . Allergy    allergic rhinitis  . Arthritis    OA  . BPH (benign prostatic hyperplasia)    microwave tx of prostate  . CAD (coronary artery disease)    a. s/p cath in 2012 showing 70-75% LAD stenosis, 75% D1 stenosis, 75% OM1, 60-70% RCA stenosis, and 80% PDA --> medical therapy pursued b. similar findings by cath in 2016; 06/2017 DES to distal RCA  . Chronic upper back pain   . Diabetes mellitus    type II x8 years  . GERD (gastroesophageal reflux disease)   . HLD (hyperlipidemia)    10/12: TC 208, TG 213, HDL 36, LDL 129  . Hx of skin cancer, basal cell    many skin cancers removed/under constant treatment.  . Hypertension   . NSTEMI (non-ST elevated myocardial infarction) ([place]) 06/18/2017   DES to distal RCA  . Obstructive sleep apnea on CPAP   . Scoliosis    Past Surgical History:  Procedure Laterality Date  . BREAST SURGERY  2009   breast lump removed benign  . CARDIAC CATHETERIZATION N/A 03/15/2015   Procedure: Left Heart Cath and Coronary Angiography;  Surgeon: Belva Crome, MD;  Location: [place] [facility];  Service: Cardiovascular;  Laterality: N/A;  . CORONARY STENT INTERVENTION N/A 06/18/2017   Procedure: CORONARY STENT INTERVENTION;  Surgeon: Jettie Booze, MD;  Location: [place] [facility];  Service: Cardiovascular;  Laterality: N/A;  RCA  . LEFT HEART CATH AND CORONARY ANGIOGRAPHY N/A 06/18/2017   Procedure: LEFT HEART CATH AND CORONARY ANGIOGRAPHY;  Surgeon: Jettie Booze, MD;  Location: [place] [facility];  Service: Cardiovascular;  Laterality: N/A;  . ULTRASOUND GUIDANCE FOR VASCULAR ACCESS  06/18/2017   Procedure: Ultrasound Guidance For Vascular Access;  Surgeon: Jettie Booze, MD;  Location: [place] [facility];  Service: Cardiovascular;;  right radial, right femoral     Current Meds  Medication Sig  . aspirin 81  MG tablet Take 81 mg by mouth daily.    . Blood Glucose Monitoring Suppl (TRUE METRIX AIR GLUCOSE METER) W/DEVICE KIT 1 kit by Other route 2 (two) times daily. Check blood sugar twice daily and as directed. Dx E11.65  . cetirizine (ZYRTEC) 10 MG tablet TAKE 1 TABLET EVERY DAY  . Coenzyme Q10 (CO Q 10 PO) Take 200 mg by mouth daily.  . Cyanocobalamin (VITAMIN B12 PO) Take 1,000 mg by mouth 2 (two) times a week.  . fluorouracil (EFUDEX) 5 % cream Apply 2 application topically daily as needed (skin lesions).   Marland Kitchen glipiZIDE (GLUCOTROL) 10 MG tablet Take 1 tablet (10 mg total) by mouth 2 (two) times daily with a meal.  . glucose blood (TRUE METRIX BLOOD GLUCOSE TEST) test strip Use to check blood sugar twice a day E11.9, Z79.4  . hydrochlorothiazide (HYDRODIURIL) 12.5 MG tablet Take 1 tablet (12.5 mg total) by mouth daily.  . Insulin Glargine (LANTUS SOLOSTAR) 100 UNIT/ML Solostar Pen INJECT  45 UNITS SUBCUTANEOUSLY AT BEDTIME  . Insulin Pen Needle (DROPLET PEN NEEDLES) 31G X 6 MM MISC USE PEN NEEDLES WHEN TAKING LANTUS AT BEDTIME  . isosorbide mononitrate (IMDUR) 30 MG 24 hr tablet TAKE 1 TABLET EVERY DAY  . lisinopril (PRINIVIL,ZESTRIL) 20 MG tablet Take 1 tablet (20 mg total) by mouth daily.  . metFORMIN (GLUCOPHAGE) 1000 MG tablet Take 1 tablet (1,000 mg total) by mouth 2 (two) times daily.  . metoprolol tartrate (LOPRESSOR) 25 MG tablet TAKE 1/2 TABLET TWICE DAILY  . nitroGLYCERIN (NITROSTAT) 0.4 MG SL tablet Place 1 tablet (0.4 mg total) every 5 (five) minutes as needed under the tongue for chest pain.  . pravastatin (PRAVACHOL) 80 MG tablet TAKE 1 TABLET EVERY EVENING  . ticagrelor (BRILINTA) 90 MG TABS tablet Take 1 tablet (90 mg total) by mouth 2 (two) times daily.  Marland Kitchen triamcinolone (NASACORT) 55 MCG/ACT AERO nasal inhaler USE ONE SPRAYS EACH NOSTRIL EVERY DAY as needed  . TRUEPLUS LANCETS 30G MISC Check blood sugar twice daily and as directed. Dx E11.65     Allergies:   Loratadine and  Simvastatin   Social History   Tobacco Use  . Smoking status: Never Smoker  . Smokeless tobacco: Never Used  Substance Use Topics  . Alcohol use: No    Alcohol/week: 0.0 standard drinks  . Drug use: No     Family Hx: The patient's family history includes Heart attack in his brother. There is no history of Colon cancer.  ROS:   Please see the history of present illness.    No fevers, chills or productive cough. All other systems reviewed and are negative.  Recent Labs: 01/14/2018: TSH 4.70 04/28/2018: ALT 21 07/03/2018: BUN 21; Creatinine, Ser 1.06; Hemoglobin 13.5; Platelets 178; Potassium 4.0; Sodium 136   Recent Lipid Panel Lab Results  Component Value Date/Time   CHOL 116 04/28/2018 08:16 AM   CHOL 127 08/08/2017 09:14 AM   TRIG 124.0 04/28/2018 08:16 AM  HDL 32.30 (L) 04/28/2018 08:16 AM   HDL 37 (L) 08/08/2017 09:14 AM   CHOLHDL 4 04/28/2018 08:16 AM   LDLCALC 59 04/28/2018 08:16 AM   LDLCALC 74 08/08/2017 09:14 AM   LDLDIRECT 78.5 11/17/2012 09:36 AM    Wt Readings from Last 3 Encounters:  10/31/18 252 lb (114.3 kg)  10/22/18 259 lb (117.5 kg)  07/09/18 248 lb 4 oz (112.6 kg)     Objective:    Vital Signs:  BP (!) 143/70 (BP Location: Right Arm)   Pulse 67   Ht 6' (1.829 m)   Wt 252 lb (114.3 kg)   BMI 34.18 kg/m    VITAL SIGNS:  reviewed  No acute distress Answers questions appropriately Normal affect Remainder of physical examination not performed (telehealth visit; coronavirus pandemic)  ASSESSMENT & PLAN:    1. Coronary artery disease-patient denies recurrent chest pain.  As outlined in previous notes he wants to avoid coronary artery bypass and graft.  Plan to continue aspirin and statin.  Discontinue Brilinta. 2. Hypertension-patient's blood pressure is controlled.  Continue present medications and follow. 3. Hyperlipidemia-patient did not tolerate Lipitor previously.  He stated Crestor was too expensive previously.  Continue pravastatin. 4.  Dyspnea-this is felt secondary to obesity and deconditioning.  COVID-19 Education: The importance of social distancing was discussed today.  Time:   Today, I have spent 15 minutes with the patient with telehealth technology discussing the above problems.     Medication Adjustments/Labs and Tests Ordered: Current medicines are reviewed at length with the patient today.  Concerns regarding medicines are outlined above.   Tests Ordered: No orders of the defined types were placed in this encounter.   Medication Changes: No orders of the defined types were placed in this encounter.   Disposition:  Follow up in 6 month(s)  Signed, Kirk Ruths, MD  10/31/2018 2:41 PM    [place]

## 2018-10-30 ENCOUNTER — Telehealth: Payer: Self-pay | Admitting: Cardiology

## 2018-10-30 NOTE — Telephone Encounter (Signed)
Mychart, no smartphone, pre reg complete 10/30/18 AF °

## 2018-10-31 ENCOUNTER — Encounter: Payer: Self-pay | Admitting: Cardiology

## 2018-10-31 ENCOUNTER — Telehealth (INDEPENDENT_AMBULATORY_CARE_PROVIDER_SITE_OTHER): Payer: Medicare HMO | Admitting: Cardiology

## 2018-10-31 VITALS — BP 143/70 | HR 67 | Ht 72.0 in | Wt 252.0 lb

## 2018-10-31 DIAGNOSIS — I2510 Atherosclerotic heart disease of native coronary artery without angina pectoris: Secondary | ICD-10-CM | POA: Diagnosis not present

## 2018-10-31 DIAGNOSIS — R0600 Dyspnea, unspecified: Secondary | ICD-10-CM

## 2018-10-31 DIAGNOSIS — I10 Essential (primary) hypertension: Secondary | ICD-10-CM

## 2018-10-31 DIAGNOSIS — E7800 Pure hypercholesterolemia, unspecified: Secondary | ICD-10-CM

## 2018-10-31 NOTE — Patient Instructions (Signed)
Medication Instructions:  STOP BRILINTA If you need a refill on your cardiac medications before your next appointment, please call your pharmacy.   Lab work: If you have labs (blood work) drawn today and your tests are completely normal, you will receive your results only by: Marland Kitchen MyChart Message (if you have MyChart) OR . A paper copy in the mail If you have any lab test that is abnormal or we need to change your treatment, we will call you to review the results.  Follow-Up: At [facility], you and your health needs are our priority.  As part of our continuing mission to provide you with exceptional heart care, we have created designated Provider Care Teams.  These Care Teams include your primary Cardiologist (physician) and Advanced Practice Providers (APPs -  Physician Assistants and Nurse Practitioners) who all work together to provide you with the care you need, when you need it. You will need a follow up appointment in 6 months.  Please call our office 2 months in advance to schedule this appointment.  You may see Kirk Ruths, MD or one of the following Advanced Practice Providers on your designated Care Team:   Kerin Ransom, PA-C Roby Lofts, [place] . Sande Rives, PA-C

## 2018-11-04 ENCOUNTER — Encounter: Payer: Medicare HMO | Admitting: Family Medicine

## 2018-11-06 ENCOUNTER — Other Ambulatory Visit: Payer: Self-pay | Admitting: Family Medicine

## 2018-12-18 ENCOUNTER — Other Ambulatory Visit: Payer: Self-pay | Admitting: Family Medicine

## 2018-12-18 ENCOUNTER — Other Ambulatory Visit: Payer: Self-pay | Admitting: Cardiology

## 2018-12-18 DIAGNOSIS — I10 Essential (primary) hypertension: Secondary | ICD-10-CM

## 2018-12-22 ENCOUNTER — Encounter: Payer: Self-pay | Admitting: Internal Medicine

## 2018-12-23 NOTE — Progress Notes (Signed)
[facility] Consultation      Assessment and Plan:  Obstructive sleep apnea. -Long history of obstructive sleep apnea.  Review of most recent download shows slightly elevated residual AHI of 13, noted to have mildly excessive leaks. - Recommended continued use of CPAP every night.    Diabetes mellitus, essential hypertension. -Obstructive sleep apnea can contribute to elevated sleep apnea, and blood glucose levels, therefore, it would be important to continue to treat the patient's sleep apnea.   Date: 12/23/2018  MRN# [id number] Ryan Garcia 01/29/1934    Ryan Garcia is a 85 y.o. old male seen in consultation for chief complaint of:    Chief Complaint  Patient presents with  . Follow-up    wearing cpap avg 8hr nightly- feels pressure and mask are okay.     HPI:  Ryan Garcia is a 85 y.o. male with obstructive sleep apnea.  At last visit he was asked to continue CPAP with auto set pressure 8-16 cm H2O.  At last visit he was having elevated leaks with elevated AHI of 13, asked to tighten up his mask a little bit.  He is deaf, his wife is present and provides history, he has been doing well with the CPAP. He is using it every night and has no issues with it.    **CPAP download 11/23/2018-12/22/2018>> raw data personally reviewed.  CPAP usage greater than 4 hours is 30/30 days.  Average usage on days used 7 hours 49 minutes.  Pressure range 8-16, median pressure 12.5, 95th percentile pressure 14.4, maximum pressure 15.4.  Leaks are within normal limits.  Residual AHI is 3.7.  Overall this shows excellent compliance with CPAP with excellent control obstructive sleep apnea. **CPAP download data 04/20/2018-05/27/2018>> raw data personally reviewed, uses greater than 4 hours is 30/30 days.  Average usage on days used is 7 hours 40 minutes.  Pressure ranges 8-16.  Median pressure 11, 95th percentile pressure of 13, maximum pressure 14.  Leaks are mildly elevated with  95th percentile leak of 46.7.  Residual AHI is 13.7 a central apnea index of 3.  Overall this shows excellent compliance with CPAP with adequate, though not ideal, control of obstructive sleep apnea with mildly elevated leak. **Echo 06/19/2017>> EF 60%.  Pulmonary artery systolic pressure reported as within the normal range. **Download data 30 days as of 05/27/17; usage greater than 4 hours is 30/30.  Average usage on days used 8 hours 9 minutes.  AutoSet 5-20.  Median pressure is 12, 95th percentile pressure is 13, maximum pressure is 14.4.  No outpatient medications have been marked as taking for the 12/24/18 encounter (Appointment) with Laverle Hobby, MD.      Allergies:  Loratadine and Simvastatin   Review of Systems:  Constitutional: Feels well. Cardiovascular: Denies chest pain, exertional chest pain.  Pulmonary: Denies hemoptysis, pleuritic chest pain.   The remainder of systems were reviewed and were found to be negative other than what is documented in the HPI.    Physical Examination:   VS: BP 140/80 (BP Location: Left Arm, Cuff Size: Normal)   Pulse 60   Temp 98.1 F (36.7 C) (Temporal)   Ht 6' (1.829 m)   Wt 261 lb (118.4 kg)   SpO2 96%   BMI 35.40 kg/m   General Appearance: No distress  Neuro:without focal findings, mental status, speech normal, alert and oriented HEENT: PERRLA, EOM intact Pulmonary: No wheezing, No rales  CardiovascularNormal S1,S2.  No m/r/g.  Abdomen: Benign,  Soft, non-tender, No masses Renal:  No costovertebral tenderness  GU:  No performed at this time. Endoc: No evident thyromegaly, no signs of acromegaly or Cushing features Skin:   warm, no rashes, no ecchymosis  Extremities: normal, no cyanosis, clubbing.      LABORATORY PANEL:   CBC No results for input(s): WBC, HGB, HCT, PLT in the last 168 hours. ------------------------------------------------------------------------------------------------------------------   Chemistries  No results for input(s): NA, K, CL, CO2, GLUCOSE, BUN, CREATININE, CALCIUM, MG, AST, ALT, ALKPHOS, BILITOT in the last 168 hours.  Invalid input(s): GFRCGP ------------------------------------------------------------------------------------------------------------------  Cardiac Enzymes No results for input(s): TROPONINI in the last 168 hours. ------------------------------------------------------------  RADIOLOGY:  No results found.     Thank  you for the consultation and for allowing [facility] to assist in the care of your patient. Our recommendations are noted above.  Please contact us if we can be of further service.  Marda Stalker, M.D., F.C.C.P.  Board Certified in Internal Medicine, Pulmonary Medicine, [place], and Sleep Medicine.  [facility] Office Number: [phone]   12/23/2018

## 2018-12-24 ENCOUNTER — Other Ambulatory Visit: Payer: Self-pay

## 2018-12-24 ENCOUNTER — Ambulatory Visit (INDEPENDENT_AMBULATORY_CARE_PROVIDER_SITE_OTHER): Payer: Medicare HMO | Admitting: Internal Medicine

## 2018-12-24 ENCOUNTER — Encounter: Payer: Self-pay | Admitting: Internal Medicine

## 2018-12-24 VITALS — BP 140/80 | HR 60 | Temp 98.1°F | Ht 72.0 in | Wt 261.0 lb

## 2018-12-24 DIAGNOSIS — G4733 Obstructive sleep apnea (adult) (pediatric): Secondary | ICD-10-CM

## 2018-12-24 NOTE — Patient Instructions (Addendum)
Continue to use cpap every night.  Rinse cpap supplies about once per week.  Will give new prescription for new cpap supplies.

## 2019-01-06 DIAGNOSIS — G4733 Obstructive sleep apnea (adult) (pediatric): Secondary | ICD-10-CM | POA: Diagnosis not present

## 2019-01-12 ENCOUNTER — Telehealth: Payer: Self-pay

## 2019-01-12 NOTE — Telephone Encounter (Signed)
Left detailed VM w COVID screen and back door lab info   

## 2019-01-13 ENCOUNTER — Telehealth: Payer: Self-pay | Admitting: Family Medicine

## 2019-01-13 DIAGNOSIS — IMO0001 Reserved for inherently not codable concepts without codable children: Secondary | ICD-10-CM

## 2019-01-13 DIAGNOSIS — E039 Hypothyroidism, unspecified: Secondary | ICD-10-CM

## 2019-01-13 DIAGNOSIS — E538 Deficiency of other specified B group vitamins: Secondary | ICD-10-CM

## 2019-01-13 DIAGNOSIS — E785 Hyperlipidemia, unspecified: Secondary | ICD-10-CM

## 2019-01-13 DIAGNOSIS — E1165 Type 2 diabetes mellitus with hyperglycemia: Secondary | ICD-10-CM

## 2019-01-13 DIAGNOSIS — E038 Other specified hypothyroidism: Secondary | ICD-10-CM

## 2019-01-13 DIAGNOSIS — N400 Enlarged prostate without lower urinary tract symptoms: Secondary | ICD-10-CM

## 2019-01-13 DIAGNOSIS — I10 Essential (primary) hypertension: Secondary | ICD-10-CM

## 2019-01-13 NOTE — Telephone Encounter (Signed)
-----   Message from Ellamae Sia sent at 01/08/2019  9:02 AM EDT ----- Regarding: Lab orders for Wednesday, 7.15.20 Patient is scheduled for CPX labs, please order future labs, Thanks , Karna Christmas

## 2019-01-14 ENCOUNTER — Other Ambulatory Visit (INDEPENDENT_AMBULATORY_CARE_PROVIDER_SITE_OTHER): Payer: Medicare HMO

## 2019-01-14 DIAGNOSIS — E785 Hyperlipidemia, unspecified: Secondary | ICD-10-CM | POA: Diagnosis not present

## 2019-01-14 DIAGNOSIS — E039 Hypothyroidism, unspecified: Secondary | ICD-10-CM | POA: Diagnosis not present

## 2019-01-14 DIAGNOSIS — E538 Deficiency of other specified B group vitamins: Secondary | ICD-10-CM | POA: Diagnosis not present

## 2019-01-14 DIAGNOSIS — E1165 Type 2 diabetes mellitus with hyperglycemia: Secondary | ICD-10-CM

## 2019-01-14 DIAGNOSIS — N400 Enlarged prostate without lower urinary tract symptoms: Secondary | ICD-10-CM

## 2019-01-14 DIAGNOSIS — E038 Other specified hypothyroidism: Secondary | ICD-10-CM

## 2019-01-14 DIAGNOSIS — I10 Essential (primary) hypertension: Secondary | ICD-10-CM

## 2019-01-14 LAB — LIPID PANEL
Cholesterol: 128 mg/dL (ref 0–200)
HDL: 36.2 mg/dL — ABNORMAL LOW (ref >39.00)
LDL Cholesterol: 71 mg/dL (ref 0–99)
NonHDL: 91.82
Total CHOL/HDL Ratio: 4
Triglycerides: 103 mg/dL (ref 0.0–149.0)
VLDL: 20.6 mg/dL (ref 0.0–40.0)

## 2019-01-14 LAB — CBC WITH DIFFERENTIAL/PLATELET
Basophils Absolute: 0 10*3/uL (ref 0.0–0.1)
Basophils Relative: 0.3 % (ref 0.0–3.0)
Eosinophils Absolute: 0.3 10*3/uL (ref 0.0–0.7)
Eosinophils Relative: 4.5 % (ref 0.0–5.0)
HCT: 40.7 % (ref 39.0–52.0)
Hemoglobin: 13.5 g/dL (ref 13.0–17.0)
Lymphocytes Relative: 24.9 % (ref 12.0–46.0)
Lymphs Abs: 1.8 10*3/uL (ref 0.7–4.0)
MCHC: 33.1 g/dL (ref 30.0–36.0)
MCV: 92.9 fl (ref 78.0–100.0)
Monocytes Absolute: 0.7 10*3/uL (ref 0.1–1.0)
Monocytes Relative: 10.3 % (ref 3.0–12.0)
Neutro Abs: 4.3 10*3/uL (ref 1.4–7.7)
Neutrophils Relative %: 60 % (ref 43.0–77.0)
Platelets: 171 10*3/uL (ref 150.0–400.0)
RBC: 4.38 Mil/uL (ref 4.22–5.81)
RDW: 15.3 % (ref 11.5–15.5)
WBC: 7.1 10*3/uL (ref 4.0–10.5)

## 2019-01-14 LAB — COMPREHENSIVE METABOLIC PANEL
ALT: 17 U/L (ref 0–53)
AST: 13 U/L (ref 0–37)
Albumin: 4.1 g/dL (ref 3.5–5.2)
Alkaline Phosphatase: 82 U/L (ref 39–117)
BUN: 19 mg/dL (ref 6–23)
CO2: 27 mEq/L (ref 19–32)
Calcium: 9 mg/dL (ref 8.4–10.5)
Chloride: 108 mEq/L (ref 96–112)
Creatinine, Ser: 1.08 mg/dL (ref 0.40–1.50)
GFR: 65.31 mL/min (ref >60.00)
Glucose, Bld: 141 mg/dL — ABNORMAL HIGH (ref 70–99)
Potassium: 4.3 mEq/L (ref 3.5–5.1)
Sodium: 143 mEq/L (ref 135–145)
Total Bilirubin: 0.6 mg/dL (ref 0.2–1.2)
Total Protein: 6.3 g/dL (ref 6.0–8.3)

## 2019-01-14 LAB — PSA, MEDICARE: PSA: 2.02 ng/ml (ref 0.10–4.00)

## 2019-01-14 LAB — HEMOGLOBIN A1C: Hgb A1c MFr Bld: 7.5 % — ABNORMAL HIGH (ref 4.6–6.5)

## 2019-01-14 LAB — TSH: TSH: 5.23 u[IU]/mL — ABNORMAL HIGH (ref 0.35–4.50)

## 2019-01-14 LAB — VITAMIN B12: Vitamin B-12: 166 pg/mL — ABNORMAL LOW (ref 211–911)

## 2019-01-16 ENCOUNTER — Other Ambulatory Visit: Payer: Self-pay

## 2019-01-16 ENCOUNTER — Ambulatory Visit (INDEPENDENT_AMBULATORY_CARE_PROVIDER_SITE_OTHER): Payer: Medicare HMO | Admitting: Family Medicine

## 2019-01-16 ENCOUNTER — Encounter: Payer: Self-pay | Admitting: Family Medicine

## 2019-01-16 VITALS — BP 140/78 | HR 63 | Temp 98.0°F | Ht 71.0 in | Wt 256.5 lb

## 2019-01-16 DIAGNOSIS — E1165 Type 2 diabetes mellitus with hyperglycemia: Secondary | ICD-10-CM | POA: Diagnosis not present

## 2019-01-16 DIAGNOSIS — Z0000 Encounter for general adult medical examination without abnormal findings: Secondary | ICD-10-CM | POA: Diagnosis not present

## 2019-01-16 DIAGNOSIS — E785 Hyperlipidemia, unspecified: Secondary | ICD-10-CM | POA: Diagnosis not present

## 2019-01-16 DIAGNOSIS — H833X3 Noise effects on inner ear, bilateral: Secondary | ICD-10-CM

## 2019-01-16 DIAGNOSIS — N400 Enlarged prostate without lower urinary tract symptoms: Secondary | ICD-10-CM

## 2019-01-16 DIAGNOSIS — E669 Obesity, unspecified: Secondary | ICD-10-CM

## 2019-01-16 DIAGNOSIS — I25119 Atherosclerotic heart disease of native coronary artery with unspecified angina pectoris: Secondary | ICD-10-CM

## 2019-01-16 DIAGNOSIS — E538 Deficiency of other specified B group vitamins: Secondary | ICD-10-CM

## 2019-01-16 DIAGNOSIS — I10 Essential (primary) hypertension: Secondary | ICD-10-CM | POA: Diagnosis not present

## 2019-01-16 DIAGNOSIS — G4733 Obstructive sleep apnea (adult) (pediatric): Secondary | ICD-10-CM

## 2019-01-16 DIAGNOSIS — Z9989 Dependence on other enabling machines and devices: Secondary | ICD-10-CM

## 2019-01-16 DIAGNOSIS — E039 Hypothyroidism, unspecified: Secondary | ICD-10-CM | POA: Diagnosis not present

## 2019-01-16 DIAGNOSIS — E038 Other specified hypothyroidism: Secondary | ICD-10-CM

## 2019-01-16 DIAGNOSIS — Z794 Long term (current) use of insulin: Secondary | ICD-10-CM | POA: Diagnosis not present

## 2019-01-16 MED ORDER — CYANOCOBALAMIN 1000 MCG/ML IJ SOLN
1000.0000 ug | Freq: Once | INTRAMUSCULAR | Status: AC
  Administered 2019-01-16: 10:00:00 1000 ug via INTRAMUSCULAR

## 2019-01-16 NOTE — Progress Notes (Signed)
Subjective:    Patient ID: Ryan Garcia, male    DOB: 09-23-1933, 85 y.o.   MRN: [id number]  HPI Here for health maintenance exam and to review chronic medical problems as well as amw  I have personally reviewed the Medicare Annual Wellness questionnaire and have noted 1. The patient's medical and social history 2. Their use of alcohol, tobacco or illicit drugs 3. Their current medications and supplements 4. The patient's functional ability including ADL's, fall risks, home safety risks and hearing or visual             impairment. 5. Diet and physical activities 6. Evidence for depression or mood disorders  The patients weight, height, BMI have been recorded in the chart and visual acuity is per eye clinic.  I have made referrals, counseling and provided education to the patient based review of the above and I have provided the pt with a written personalized care plan for preventive services. Reviewed and updated provider list, see scanned forms.  See scanned forms.  Routine anticipatory guidance given to patient.  See health maintenance. Colon cancer screening -had a colonoscopy 2/12  Flu vaccine 10/19 Tetanus vaccine Tdap 1/13 Pneumovax-completed both Zoster vaccine-zostavax 5/14 Prostate health Lab Results  Component Value Date   PSA 2.02 01/14/2019   PSA 2.25 09/07/2016   PSA 2.12 08/19/2015   has BPH- dribbles and leaks (has had procedure in the past)  No worse   Advance directive-does not have Cognitive function addressed- see scanned forms- and if abnormal then additional documentation follows. -misplaces things/that is all Doing well   PMH and SH reviewed  Meds, vitals, and allergies reviewed.   ROS: See HPI.  Otherwise negative.    Wt Readings from Last 3 Encounters:  01/16/19 256 lb 8 oz (116.3 kg)  12/24/18 261 lb (118.4 kg)  10/31/18 252 lb (114.3 kg)  weight is down 5 lb  35.77 kg/m  Working more  More fruit and less sweets    Hearing/vis  Hearing Screening   125Hz  250Hz  500Hz  1000Hz  2000Hz  3000Hz  4000Hz  6000Hz  8000Hz   Right ear:           Left ear:           Comments: Pt wears hearing aids   Vision Screening Comments: Pt had eye exam in Dec 2019 with Dr. Dory Horn due for eye exam     bp is stable today  No cp or palpitations or headaches or edema  No side effects to medicines  BP Readings from Last 3 Encounters:  01/16/19 140/78  12/24/18 140/80  10/31/18 (!) 143/70     Lab Results  Component Value Date   CREATININE 1.08 01/14/2019   BUN 19 01/14/2019   NA 143 01/14/2019   K 4.3 01/14/2019   CL 108 01/14/2019   CO2 27 01/14/2019   Lab Results  Component Value Date   ALT 17 01/14/2019   AST 13 01/14/2019   ALKPHOS 82 01/14/2019   BILITOT 0.6 01/14/2019    Subclinical hypothyroid Lab Results  Component Value Date   TSH 5.23 (H) 01/14/2019   up from 4.7 and 5.86 before that   Wears cpap for sleep apnea -still doing well with that   DM2 Lab Results  Component Value Date   HGBA1C 7.5 (H) 01/14/2019   Last was 7.2 Glipizide 10 mg bid  lantus 45 u daily (he reduced it to 44 so it would last longer) Then pend come out even  Refuses to  watch diet or go up on lantus  He may be able to eat better  Metformin 1000 mg bid  On ace and statin   B12 def  Lab Results  Component Value Date   VITAMINB12 166 (L) 01/14/2019   Oral -only taking B12 twice weekly Open to increasing this   Hyperlipidemia Lab Results  Component Value Date   CHOL 128 01/14/2019   CHOL 116 04/28/2018   CHOL 122 10/15/2017   Lab Results  Component Value Date   HDL 36.20 (L) 01/14/2019   HDL 32.30 (L) 04/28/2018   HDL 34.20 (L) 10/15/2017   Lab Results  Component Value Date   LDLCALC 71 01/14/2019   LDLCALC 59 04/28/2018   LDLCALC 69 10/15/2017   Lab Results  Component Value Date   TRIG 103.0 01/14/2019   TRIG 124.0 04/28/2018   TRIG 96.0 10/15/2017   Lab Results  Component Value Date   CHOLHDL 4  01/14/2019   CHOLHDL 4 04/28/2018   CHOLHDL 4 10/15/2017   Lab Results  Component Value Date   LDLDIRECT 78.5 11/17/2012   LDLDIRECT 156.5 05/09/2010   LDLDIRECT 152.9 08/09/2009   Taking pravastatin and diet  Does eat ham/and cheese and eggs/bacon /sausage  LDL is up a bit    Patient Active Problem List   Diagnosis Date Noted  . Medicare annual wellness visit, subsequent 01/16/2019  . Ecchymosis 10/22/2018  . CAP (community acquired pneumonia) 07/09/2018  . Obesity (BMI 30-39.9) 07/09/2018  . HOH (hard of hearing) 03/31/2018  . Uncontrolled type 2 diabetes mellitus with hyperglycemia ([place]) 10/28/2017  . Subclinical hypothyroidism 10/28/2017  . Scoliosis   . Obstructive sleep apnea on CPAP   . Hx of skin cancer, basal cell   . Dyslipidemia, goal LDL below 70   . GERD (gastroesophageal reflux disease)   . Chronic upper back pain   . Arthritis   . Allergy   . AC (acromioclavicular) joint bone spurs   . NSTEMI (non-ST elevated myocardial infarction) ([place]) 06/18/2017  . Actinic keratoses 04/29/2017  . Positive colorectal cancer screening using Cologuard test 10/31/2016  . BPH (benign prostatic hyperplasia) 09/04/2016  . B12 deficiency 11/22/2015  . Routine general medical examination at a health care facility 08/26/2015  . Abnormal nuclear cardiac imaging test   . Dyspnea on exertion 11/27/2013  . Encounter for examination of normal volunteer in research study 05/29/2013  . Prostate cancer screening 07/15/2012  . CAD S/P percutaneous coronary angioplasty 05/17/2011  . Nonspecific abnormal results of cardiovascular function study 04/30/2011  . Abnormal EKG 04/06/2011  . Left arm pain 04/06/2011  . Right low back pain 01/13/2010  . Insulin dependent diabetes mellitus ([place]) 10/04/2006  . ERECTILE DYSFUNCTION 10/04/2006  . Essential hypertension 10/04/2006  . ALLERGIC RHINITIS 10/04/2006  . OSTEOARTHRITIS 10/04/2006  . Sleep apnea 10/04/2006  . EDEMA 10/04/2006  . SKIN  CANCER, HX OF 10/04/2006   Past Medical History:  Diagnosis Date  . AC (acromioclavicular) joint bone spurs    lt ankle  . Allergy    allergic rhinitis  . Arthritis    OA  . BPH (benign prostatic hyperplasia)    microwave tx of prostate  . CAD (coronary artery disease)    a. s/p cath in 2012 showing 70-75% LAD stenosis, 75% D1 stenosis, 75% OM1, 60-70% RCA stenosis, and 80% PDA --> medical therapy pursued b. similar findings by cath in 2016; 06/2017 DES to distal RCA  . Chronic upper back pain   . Diabetes mellitus  type II x8 years  . GERD (gastroesophageal reflux disease)   . HLD (hyperlipidemia)    10/12: TC 208, TG 213, HDL 36, LDL 129  . Hx of skin cancer, basal cell    many skin cancers removed/under constant treatment.  . Hypertension   . NSTEMI (non-ST elevated myocardial infarction) ([place]) 06/18/2017   DES to distal RCA  . Obstructive sleep apnea on CPAP   . Scoliosis    Past Surgical History:  Procedure Laterality Date  . BREAST SURGERY  2009   breast lump removed benign  . CARDIAC CATHETERIZATION N/A 03/15/2015   Procedure: Left Heart Cath and Coronary Angiography;  Surgeon: Belva Crome, MD;  Location: [facility];  Service: Cardiovascular;  Laterality: N/A;  . CORONARY STENT INTERVENTION N/A 06/18/2017   Procedure: CORONARY STENT INTERVENTION;  Surgeon: Jettie Booze, MD;  Location: [facility];  Service: Cardiovascular;  Laterality: N/A;  RCA  . LEFT HEART CATH AND CORONARY ANGIOGRAPHY N/A 06/18/2017   Procedure: LEFT HEART CATH AND CORONARY ANGIOGRAPHY;  Surgeon: Jettie Booze, MD;  Location: [facility];  Service: Cardiovascular;  Laterality: N/A;  . ULTRASOUND GUIDANCE FOR VASCULAR ACCESS  06/18/2017   Procedure: Ultrasound Guidance For Vascular Access;  Surgeon: Jettie Booze, MD;  Location: [facility];  Service: Cardiovascular;;  right radial, right femoral   Social History   Tobacco Use  . Smoking  status: Never Smoker  . Smokeless tobacco: Never Used  Substance Use Topics  . Alcohol use: No    Alcohol/week: 0.0 standard drinks  . Drug use: No   Family History  Problem Relation Age of Onset  . Heart attack Brother   . Colon cancer Neg Hx    Allergies  Allergen Reactions  . Loratadine     REACTION: not effective  . Simvastatin     REACTION: intolerant   Current Outpatient Medications on File Prior to Visit  Medication Sig Dispense Refill  . aspirin 81 MG tablet Take 81 mg by mouth daily.      . Blood Glucose Monitoring Suppl (TRUE METRIX AIR GLUCOSE METER) W/DEVICE KIT 1 kit by Other route 2 (two) times daily. Check blood sugar twice daily and as directed. Dx E11.65 1 kit 0  . cetirizine (ZYRTEC) 10 MG tablet TAKE 1 TABLET EVERY DAY 90 tablet 1  . Coenzyme Q10 (CO Q 10 PO) Take 200 mg by mouth daily.    . Cyanocobalamin (VITAMIN B12 PO) Take 1,000 mg by mouth daily.     . DROPLET PEN NEEDLES 31G X 6 MM MISC USE ONE TIME DAILY  AT  BEDTIME  WITH  LANTUS 100 each 1  . fluorouracil (EFUDEX) 5 % cream Apply 2 application topically daily as needed (skin lesions).     Marland Kitchen glipiZIDE (GLUCOTROL) 10 MG tablet TAKE 1 TABLET (10 MG TOTAL) BY MOUTH 2 (TWO) TIMES DAILY WITH A MEAL. 180 tablet 0  . glucose blood (TRUE METRIX BLOOD GLUCOSE TEST) test strip Use to check blood sugar twice a day E11.9, Z79.4 200 each 4  . hydrochlorothiazide (HYDRODIURIL) 12.5 MG tablet Take 1 tablet (12.5 mg total) by mouth daily. 90 tablet 3  . Insulin Glargine (LANTUS SOLOSTAR) 100 UNIT/ML Solostar Pen INJECT  45 UNITS SUBCUTANEOUSLY AT BEDTIME 45 mL 3  . isosorbide mononitrate (IMDUR) 30 MG 24 hr tablet TAKE 1 TABLET EVERY DAY 90 tablet 3  . lisinopril (ZESTRIL) 20 MG tablet TAKE 1 TABLET EVERY DAY  90 tablet 3  . metFORMIN (GLUCOPHAGE) 1000 MG tablet Take 1 tablet (1,000 mg total) by mouth 2 (two) times daily. 180 tablet 3  . metoprolol tartrate (LOPRESSOR) 25 MG tablet TAKE 1/2 TABLET TWICE DAILY 90 tablet 3   . nitroGLYCERIN (NITROSTAT) 0.4 MG SL tablet Place 1 tablet (0.4 mg total) every 5 (five) minutes as needed under the tongue for chest pain. 25 tablet 3  . pravastatin (PRAVACHOL) 80 MG tablet TAKE 1 TABLET EVERY EVENING 90 tablet 1  . triamcinolone (NASACORT) 55 MCG/ACT AERO nasal inhaler USE ONE SPRAYS EACH NOSTRIL EVERY DAY as needed    . TRUEPLUS LANCETS 30G MISC Check blood sugar twice daily and as directed. Dx E11.65 200 each 1   No current facility-administered medications on file prior to visit.      Review of Systems  Constitutional: Negative for activity change, appetite change, fatigue, fever and unexpected weight change.  HENT: Negative for congestion, rhinorrhea, sore throat and trouble swallowing.   Eyes: Negative for pain, redness, itching and visual disturbance.  Respiratory: Negative for cough, chest tightness, shortness of breath and wheezing.   Cardiovascular: Negative for chest pain and palpitations.  Gastrointestinal: Negative for abdominal pain, blood in stool, constipation, diarrhea and nausea.  Endocrine: Negative for cold intolerance, heat intolerance, polydipsia and polyuria.  Genitourinary: Negative for difficulty urinating, dysuria, frequency and urgency.  Musculoskeletal: Positive for arthralgias and back pain. Negative for joint swelling and myalgias.  Skin: Negative for pallor and rash.       Many skin cancers 2 new ones recently-followed by dermatology  Neurological: Negative for dizziness, tremors, weakness, numbness and headaches.  Hematological: Negative for adenopathy. Does not bruise/bleed easily.  Psychiatric/Behavioral: Negative for decreased concentration and dysphoric mood. The patient is not nervous/anxious.        Objective:   Physical Exam Constitutional:      General: He is not in acute distress.    Appearance: Normal appearance. He is well-developed. He is obese. He is not ill-appearing or diaphoretic.  HENT:     Head: Normocephalic and  atraumatic.     Right Ear: Tympanic membrane, ear canal and external ear normal.     Left Ear: Tympanic membrane, ear canal and external ear normal.     Ears:     Comments: Hearing aides bilat    Nose: Nose normal. No rhinorrhea.     Mouth/Throat:     Mouth: Mucous membranes are moist.     Pharynx: Oropharynx is clear. No posterior oropharyngeal erythema.  Eyes:     General: No scleral icterus.       Right eye: No discharge.        Left eye: No discharge.     Conjunctiva/sclera: Conjunctivae normal.     Pupils: Pupils are equal, round, and reactive to light.  Neck:     Musculoskeletal: Normal range of motion and neck supple. No neck rigidity or muscular tenderness.     Thyroid: No thyromegaly.     Vascular: No carotid bruit or JVD.  Cardiovascular:     Rate and Rhythm: Normal rate and regular rhythm.     Pulses: Normal pulses.     Heart sounds: Normal heart sounds. No gallop.   Pulmonary:     Effort: Pulmonary effort is normal. No respiratory distress.     Breath sounds: Normal breath sounds. No wheezing.  Chest:     Chest wall: No tenderness.  Abdominal:     General: Bowel sounds are normal.  There is no distension or abdominal bruit.     Palpations: Abdomen is soft. There is no mass.     Tenderness: There is no abdominal tenderness. There is no rebound.     Hernia: No hernia is present.  Musculoskeletal:        General: No tenderness.     Right lower leg: Edema present.     Left lower leg: Edema present.     Comments: Trace ankle edema   Lymphadenopathy:     Cervical: No cervical adenopathy.  Skin:    General: Skin is warm and dry.     Coloration: Skin is not pale.     Findings: No erythema, lesion or rash.     Comments: Solar aging  AKs and SKs diffusely  Multiple bx scar sites   Neurological:     Mental Status: He is alert. Mental status is at baseline.     Cranial Nerves: No cranial nerve deficit.     Motor: No abnormal muscle tone.     Coordination:  Coordination normal.     Gait: Gait normal.     Deep Tendon Reflexes: Reflexes are normal and symmetric. Reflexes normal.  Psychiatric:        Mood and Affect: Mood normal.           Assessment & Plan:   Problem List Items Addressed This Visit      Cardiovascular and Mediastinum   Essential hypertension    bp in fair control at this time  BP Readings from Last 1 Encounters:  01/16/19 140/78   No changes needed Most recent labs reviewed  Disc lifstyle change with low sodium diet and exercise        Coronary artery disease involving native coronary artery of native heart with angina pectoris (HCC)    No angina today Clinically stable  Followed by cardiology        Respiratory   Obstructive sleep apnea on CPAP    Continues to use CPAP compliantly        Endocrine   Uncontrolled type 2 diabetes mellitus with hyperglycemia (HCC)    Lab Results  Component Value Date   HGBA1C 7.5 (H) 01/14/2019   Up from 7.2 Diet not optimal and unable to exercise much  Pt still refuses to inc his lantus dose or change his habits Taking ace and statin  F/u 6 mo He will schedule his own eye exam      Subclinical hypothyroidism    Stable TSH-mildly elevated No clinical changes  Last FT4 in nl range Will continue to monitor        Nervous and Auditory   HOH (hard of hearing)    Wearing hearing aides-still could not hear well during visit Much of our conversation done with pen/paper which worked well        Genitourinary   BPH (benign prostatic hyperplasia)    Lab Results  Component Value Date   PSA 2.02 01/14/2019   PSA 2.25 09/07/2016   PSA 2.12 08/19/2015   no clinical changes - some dribbling/leak with urination since his surgery          Other   Routine general medical examination at a health care facility    Reviewed health habits including diet and exercise and skin cancer prevention Reviewed appropriate screening tests for age  Also reviewed health mt  list, fam hx and immunization status , as well as social and family history   See HPI Labs  reviewed  B12 shot given  inst pt to make his eye doctor appt for DM exam Paperwork given to work on adv directive Enc better diet/exercise- but pt is not motivated       B12 deficiency    Lab Results  Component Value Date   VITAMINB12 166 (L) 01/14/2019   Again reminded to inc supplement to daily  Given B12 shot today as well        Dyslipidemia, goal LDL below 70    LDL of 71 Up mildly due to diet Continues pravastatin which he tolerates  Disc goals for lipids and reasons to control them Rev last labs with pt Rev low sat fat diet in detail       Obesity (BMI 30-39.9)    Discussed how this problem influences overall health and the risks it imposes  Reviewed plan for weight loss with lower calorie diet (via better food choices and also portion control or program like weight watchers) and exercise building up to or more than 30 minutes 5 days per week including some aerobic activity   Pt is not motivated      Medicare annual wellness visit, subsequent - Primary    Reviewed health habits including diet and exercise and skin cancer prevention Reviewed appropriate screening tests for age  Also reviewed health mt list, fam hx and immunization status , as well as social and family history   See HPI Labs reviewed  B12 shot given  inst pt to make his eye doctor appt for DM exam Paperwork given to work on English as a second language teacher better diet/exercise- but pt is not motivated

## 2019-01-16 NOTE — Patient Instructions (Addendum)
Please work on an advance directive with your family and get it notarized- then we can scan a copy- very important!   Weight is down  Try to get most of your carbohydrates from produce (with the exception of white potatoes)  Eat less bread/pasta/rice/snack foods/cereals/sweets and other items from the middle of the grocery store (processed carbs)   B12 is very low Start taking it every day, 1000 mcg  We will give you a shot today   Make your eye exam appointment  Please let me know the date of your last eye exam   Take care of yourself Wear your mask  Be careful /avoid crowds   Follow up in 6 months   I can refill medicines that need refilling

## 2019-01-18 DIAGNOSIS — I25119 Atherosclerotic heart disease of native coronary artery with unspecified angina pectoris: Secondary | ICD-10-CM | POA: Insufficient documentation

## 2019-01-18 DIAGNOSIS — I2510 Atherosclerotic heart disease of native coronary artery without angina pectoris: Secondary | ICD-10-CM | POA: Insufficient documentation

## 2019-01-18 NOTE — Assessment & Plan Note (Signed)
Lab Results  Component Value Date   PSA 2.02 01/14/2019   PSA 2.25 09/07/2016   PSA 2.12 08/19/2015   no clinical changes - some dribbling/leak with urination since his surgery

## 2019-01-18 NOTE — Assessment & Plan Note (Signed)
Wearing hearing aides-still could not hear well during visit Much of our conversation done with pen/paper which worked well

## 2019-01-18 NOTE — Assessment & Plan Note (Signed)
No angina today Clinically stable  Followed by cardiology

## 2019-01-18 NOTE — Assessment & Plan Note (Signed)
bp in fair control at this time  BP Readings from Last 1 Encounters:  01/16/19 140/78   No changes needed Most recent labs reviewed  Disc lifstyle change with low sodium diet and exercise

## 2019-01-18 NOTE — Assessment & Plan Note (Signed)
Lab Results  Component Value Date   HGBA1C 7.5 (H) 01/14/2019   Up from 7.2 Diet not optimal and unable to exercise much  Pt still refuses to inc his lantus dose or change his habits Taking ace and statin  F/u 6 mo He will schedule his own eye exam

## 2019-01-18 NOTE — Assessment & Plan Note (Signed)
Reviewed health habits including diet and exercise and skin cancer prevention Reviewed appropriate screening tests for age  Also reviewed health mt list, fam hx and immunization status , as well as social and family history   See HPI Labs reviewed  B12 shot given  inst pt to make his eye doctor appt for DM exam Paperwork given to work on English as a second language teacher better diet/exercise- but pt is not motivated

## 2019-01-18 NOTE — Assessment & Plan Note (Signed)
LDL of 71 Up mildly due to diet Continues pravastatin which he tolerates  Disc goals for lipids and reasons to control them Rev last labs with pt Rev low sat fat diet in detail

## 2019-01-18 NOTE — Assessment & Plan Note (Signed)
Continues to use CPAP compliantly

## 2019-01-18 NOTE — Assessment & Plan Note (Signed)
Discussed how this problem influences overall health and the risks it imposes  Reviewed plan for weight loss with lower calorie diet (via better food choices and also portion control or program like weight watchers) and exercise building up to or more than 30 minutes 5 days per week including some aerobic activity   Pt is not motivated 

## 2019-01-18 NOTE — Assessment & Plan Note (Signed)
Stable TSH-mildly elevated No clinical changes  Last FT4 in nl range Will continue to monitor

## 2019-01-18 NOTE — Assessment & Plan Note (Signed)
Lab Results  Component Value Date   VITAMINB12 166 (L) 01/14/2019   Again reminded to inc supplement to daily  Given B12 shot today as well

## 2019-01-30 DIAGNOSIS — L570 Actinic keratosis: Secondary | ICD-10-CM | POA: Diagnosis not present

## 2019-01-30 DIAGNOSIS — L538 Other specified erythematous conditions: Secondary | ICD-10-CM | POA: Diagnosis not present

## 2019-01-30 DIAGNOSIS — Z08 Encounter for follow-up examination after completed treatment for malignant neoplasm: Secondary | ICD-10-CM | POA: Diagnosis not present

## 2019-01-30 DIAGNOSIS — L281 Prurigo nodularis: Secondary | ICD-10-CM | POA: Diagnosis not present

## 2019-01-30 DIAGNOSIS — Z872 Personal history of diseases of the skin and subcutaneous tissue: Secondary | ICD-10-CM | POA: Diagnosis not present

## 2019-01-30 DIAGNOSIS — Z85828 Personal history of other malignant neoplasm of skin: Secondary | ICD-10-CM | POA: Diagnosis not present

## 2019-01-30 DIAGNOSIS — D485 Neoplasm of uncertain behavior of skin: Secondary | ICD-10-CM | POA: Diagnosis not present

## 2019-01-30 DIAGNOSIS — C4442 Squamous cell carcinoma of skin of scalp and neck: Secondary | ICD-10-CM | POA: Diagnosis not present

## 2019-01-30 DIAGNOSIS — L298 Other pruritus: Secondary | ICD-10-CM | POA: Diagnosis not present

## 2019-01-30 DIAGNOSIS — Z09 Encounter for follow-up examination after completed treatment for conditions other than malignant neoplasm: Secondary | ICD-10-CM | POA: Diagnosis not present

## 2019-02-05 ENCOUNTER — Other Ambulatory Visit: Payer: Self-pay | Admitting: Family Medicine

## 2019-02-23 ENCOUNTER — Other Ambulatory Visit: Payer: Self-pay | Admitting: Cardiology

## 2019-03-17 DIAGNOSIS — D485 Neoplasm of uncertain behavior of skin: Secondary | ICD-10-CM | POA: Diagnosis not present

## 2019-03-17 DIAGNOSIS — C4442 Squamous cell carcinoma of skin of scalp and neck: Secondary | ICD-10-CM | POA: Diagnosis not present

## 2019-03-19 ENCOUNTER — Other Ambulatory Visit: Payer: Self-pay | Admitting: Cardiology

## 2019-04-22 DIAGNOSIS — C4442 Squamous cell carcinoma of skin of scalp and neck: Secondary | ICD-10-CM | POA: Diagnosis not present

## 2019-04-29 ENCOUNTER — Encounter: Payer: Self-pay | Admitting: *Deleted

## 2019-05-01 ENCOUNTER — Other Ambulatory Visit: Payer: Self-pay | Admitting: Student

## 2019-05-05 ENCOUNTER — Ambulatory Visit: Payer: Medicare HMO | Admitting: Cardiology

## 2019-05-10 NOTE — Progress Notes (Signed)
Cardiology Office Note:    Date:  05/11/2019   ID:  Ryan Garcia, DOB [birth year], MRN [id number]  PCP:  Ryan Greenspan, MD  Cardiologist:  Ryan Ruths, MD   Referring MD: Ryan Greenspan, MD   Chief Complaint  Patient presents with  . Follow-up    CAD    History of Present Illness:    Ryan Garcia is a 85 y.o. male with a hx of CAD with three vessel disease treated medically by heart cath in 2012 and 2016 (wanted to avoid CABG), OSA on CPAP, HTN, uncontrolled DM2, hyperlipidemia with LDL goal <70, and obesity. Heart cath 06/2017 for NSTEMI revealed new RCA lesion treated with DES. He has chronic DOE and occasional pedal edema. He was last seen by Dr. Stanford Garcia 10/31/18 and was doing well at that time.   He presents for follow up with his wife (he is Ryan Garcia). He complains that the 12.5 mg lopressor BID slows him down; sounds like this keeps him from doing too much. He is still very active at his home on [place] property and [place]. He denies anginal symptoms. Overall, he is doing very well, he has not taken nitro. Pressures are well-controlled. His A1c is not controlled at 7.5% in July. His PCP wants him on more insulin, but he has not increased because of this amount in his pens. I strongly encouraged him to get better control of his A1c for his heart disease.      Past Medical History:  Diagnosis Date  . AC (acromioclavicular) joint bone spurs    lt ankle  . Allergy    allergic rhinitis  . Arthritis    OA  . BPH (benign prostatic hyperplasia)    microwave tx of prostate  . CAD (coronary artery disease)    a. s/p cath in 2012 showing 70-75% LAD stenosis, 75% D1 stenosis, 75% OM1, 60-70% RCA stenosis, and 80% PDA --> medical therapy pursued b. similar findings by cath in 2016; 06/2017 DES to distal RCA  . Chronic upper back pain   . Diabetes mellitus    type II x8 years  . GERD (gastroesophageal reflux disease)   . HLD (hyperlipidemia)    10/12: TC 208, TG 213, HDL 36, LDL 129  .  Hx of skin cancer, basal cell    many skin cancers removed/under constant treatment.  . Hypertension   . NSTEMI (non-ST elevated myocardial infarction) ([place]) 06/18/2017   DES to distal RCA  . Obstructive sleep apnea on CPAP   . Scoliosis     Past Surgical History:  Procedure Laterality Date  . BREAST SURGERY  2009   breast lump removed benign  . CARDIAC CATHETERIZATION N/A 03/15/2015   Procedure: Left Heart Cath and Coronary Angiography;  Surgeon: Belva Crome, MD;  Location: [facility];  Service: Cardiovascular;  Laterality: N/A;  . CORONARY STENT INTERVENTION N/A 06/18/2017   Procedure: CORONARY STENT INTERVENTION;  Surgeon: Jettie Booze, MD;  Location: [facility];  Service: Cardiovascular;  Laterality: N/A;  RCA  . LEFT HEART CATH AND CORONARY ANGIOGRAPHY N/A 06/18/2017   Procedure: LEFT HEART CATH AND CORONARY ANGIOGRAPHY;  Surgeon: Jettie Booze, MD;  Location: [facility];  Service: Cardiovascular;  Laterality: N/A;  . ULTRASOUND GUIDANCE FOR VASCULAR ACCESS  06/18/2017   Procedure: Ultrasound Guidance For Vascular Access;  Surgeon: Jettie Booze, MD;  Location: [facility];  Service: Cardiovascular;;  right radial, right femoral  Current Medications: Current Meds  Medication Sig  . aspirin 81 MG tablet Take 81 mg by mouth daily.    . Blood Glucose Monitoring Suppl (TRUE METRIX AIR GLUCOSE METER) W/DEVICE KIT 1 kit by Other route 2 (two) times daily. Check blood sugar twice daily and as directed. Dx E11.65  . cetirizine (ZYRTEC) 10 MG tablet TAKE 1 TABLET EVERY DAY  . Coenzyme Q10 (CO Q 10 PO) Take 200 mg by mouth daily.  . Cyanocobalamin (VITAMIN B12 PO) Take 1,000 mg by mouth daily.   . DROPLET PEN NEEDLES 31G X 6 MM MISC USE ONE TIME DAILY  AT  BEDTIME  WITH  LANTUS  . glipiZIDE (GLUCOTROL) 10 MG tablet TAKE 1 TABLET TWICE DAILY WITH MEALS  . glucose blood (TRUE METRIX BLOOD GLUCOSE TEST) test strip Use to check blood  sugar twice a day E11.9, Z79.4  . hydrochlorothiazide (HYDRODIURIL) 12.5 MG tablet TAKE 1 TABLET BY MOUTH DAILY.  Marland Kitchen Insulin Glargine (LANTUS SOLOSTAR) 100 UNIT/ML Solostar Pen INJECT  45 UNITS SUBCUTANEOUSLY AT BEDTIME  . isosorbide mononitrate (IMDUR) 30 MG 24 hr tablet TAKE 1 TABLET EVERY DAY  . lisinopril (ZESTRIL) 20 MG tablet TAKE 1 TABLET EVERY DAY  . metFORMIN (GLUCOPHAGE) 1000 MG tablet TAKE 1 TABLET TWICE DAILY  . metoprolol tartrate (LOPRESSOR) 25 MG tablet TAKE 1/2 TABLET TWICE DAILY  . nitroGLYCERIN (NITROSTAT) 0.4 MG SL tablet Place 1 tablet (0.4 mg total) every 5 (five) minutes as needed under the tongue for chest pain.  . pravastatin (PRAVACHOL) 80 MG tablet TAKE 1 TABLET EVERY EVENING  . triamcinolone (NASACORT) 55 MCG/ACT AERO nasal inhaler USE ONE SPRAYS EACH NOSTRIL EVERY DAY as needed  . TRUEPLUS LANCETS 30G MISC Check blood sugar twice daily and as directed. Dx E11.65  . [DISCONTINUED] fluorouracil (EFUDEX) 5 % cream Apply 2 application topically daily as needed (skin lesions).      Allergies:   Loratadine and Simvastatin   Social History   Socioeconomic History  . Marital status: Married    Spouse name: Not on file  . Number of children: Not on file  . Years of education: Not on file  . Highest education level: Not on file  Occupational History  . Not on file  Social Needs  . Financial resource strain: Not on file  . Food insecurity    Worry: Not on file    Inability: Not on file  . Transportation needs    Medical: Not on file    Non-medical: Not on file  Tobacco Use  . Smoking status: Never Smoker  . Smokeless tobacco: Never Used  Substance and Sexual Activity  . Alcohol use: No    Alcohol/week: 0.0 standard drinks  . Drug use: No  . Sexual activity: Not Currently  Lifestyle  . Physical activity    Days per week: Not on file    Minutes per session: Not on file  . Stress: Not on file  Relationships  . Social Herbalist on phone: Not on  file    Gets together: Not on file    Attends religious service: Not on file    Active member of club or organization: Not on file    Attends meetings of clubs or organizations: Not on file    Relationship status: Not on file  Other Topics Concern  . Not on file  Social History Narrative   Married    Physically active hard of hearing     Family History:  The patient's family history includes Heart attack in his brother. There is no history of Colon cancer.  ROS:   Please see the history of present illness.     All other systems reviewed and are negative.  EKGs/Labs/Other Studies Reviewed:    The following studies were reviewed today:  Left heart cath 2018   Prox LAD lesion is 85% stenosed.  Prox LAD to Mid LAD lesion is 75% stenosed.  Ost 1st Diag to 1st Diag lesion is 85% stenosed.  Dist LAD lesion is 65% stenosed.  Ost Cx to Mid Cx lesion is 80% stenosed.  Ost 1st Mrg to 1st Mrg lesion is 95% stenosed.  Mid Cx to Dist Cx lesion is 65% stenosed.  2nd Mrg lesion is 75% stenosed.  Prox RCA to Mid RCA lesion is 30% stenosed.  RPDA lesion is 60% stenosed.  1st RPLB lesion is 70% stenosed.  Dist RCA lesion is 99% stenosed.  A drug-eluting stent was successfully placed using a STENT SYNERGY DES 3X12, postdilated to 3.3 mm.  Post intervention, there is a 0% residual stenosis.  LV end diastolic pressure is normal.  There is no aortic valve stenosis.   Left sided disease unchanged from prior.  Culprit was RCA lesion.  D/w Dr. Stanford Garcia, and decided to proceed with fixing the culprit lesion given the patient's preference of avoiding CABG.    SYNERGY stent placed so DAPT could be stopped at 3 months if needed.  Otherwise, would recommend DAPT for 1 year given NSTEMI.    EKG:  EKG is  ordered today.  The ekg ordered today demonstrates sinus rhythm with HR 65, nonspecific ST changes  Recent Labs: 01/14/2019: ALT 17; BUN 19; Creatinine, Ser 1.08; Hemoglobin 13.5;  Platelets 171.0; Potassium 4.3; Sodium 143; TSH 5.23  Recent Lipid Panel    Component Value Date/Time   CHOL 128 01/14/2019 0859   CHOL 127 08/08/2017 0914   TRIG 103.0 01/14/2019 0859   HDL 36.20 (L) 01/14/2019 0859   HDL 37 (L) 08/08/2017 0914   CHOLHDL 4 01/14/2019 0859   VLDL 20.6 01/14/2019 0859   LDLCALC 71 01/14/2019 0859   LDLCALC 74 08/08/2017 0914   LDLDIRECT 78.5 11/17/2012 0936    Physical Exam:    VS:  BP 118/60   Pulse 67   Temp 98.2 F (36.8 C)   Ht 5' 11"  (1.803 m)   Wt 256 lb 12.8 oz (116.5 kg)   SpO2 96%   BMI 35.82 kg/m     Wt Readings from Last 3 Encounters:  05/11/19 256 lb 12.8 oz (116.5 kg)  01/16/19 256 lb 8 oz (116.3 kg)  12/24/18 261 lb (118.4 kg)     GEN: Well nourished, well developed in no acute distress HEENT: Normal NECK: No JVD; No carotid bruits LYMPHATICS: No lymphadenopathy CARDIAC: RRR, no murmurs, rubs, gallops RESPIRATORY:  Clear to auscultation without rales, wheezing or rhonchi  ABDOMEN: Soft, non-tender, non-distended MUSCULOSKELETAL:  No edema; No deformity  SKIN: Warm and dry NEUROLOGIC:  Alert and oriented x 3 PSYCHIATRIC:  Normal affect   ASSESSMENT:    1. Essential hypertension   2. NSTEMI (non-ST elevated myocardial infarction) ([place])   3. Coronary artery disease involving native coronary artery of native heart with angina pectoris ([place])   4. Obstructive sleep apnea on CPAP   5. Uncontrolled type 2 diabetes mellitus with hyperglycemia ([place])   6. Dyslipidemia, goal LDL below 70    PLAN:    In order of problems listed above:  CAD with three vessel disease and DES to RCA (2018) - continue ASA, statin - brilinta was D/C'ed - continue imdur, lisinopril, lopressor, HCTZ - refilled his nitro   Hypertension - medications as above - pressures well-controlled   Hyperlipidemia - on pravastatin - intolerant to lipitor, crestor changed to pravachol after stent 01/14/2019: Cholesterol 128; HDL 36.20; LDL  Cholesterol 71; Triglycerides 103.0; VLDL 20.6   IDDM - not controlled - A1c is 7.5% - I encouraged him to increase his insulin as his PCP has recommended   OSA on CPAP - compliant   Follow up with Dr. Stanford Garcia in 4 months.   Medication Adjustments/Labs and Tests Ordered: Current medicines are reviewed at length with the patient today.  Concerns regarding medicines are outlined above.  No orders of the defined types were placed in this encounter.  No orders of the defined types were placed in this encounter.   Signed, Ledora Bottcher, PA  05/11/2019 2:34 PM    [facility]

## 2019-05-11 ENCOUNTER — Ambulatory Visit (INDEPENDENT_AMBULATORY_CARE_PROVIDER_SITE_OTHER): Payer: Medicare HMO | Admitting: Physician Assistant

## 2019-05-11 ENCOUNTER — Other Ambulatory Visit: Payer: Self-pay

## 2019-05-11 ENCOUNTER — Encounter: Payer: Self-pay | Admitting: Physician Assistant

## 2019-05-11 VITALS — BP 118/60 | HR 67 | Temp 98.2°F | Ht 71.0 in | Wt 256.8 lb

## 2019-05-11 DIAGNOSIS — E1165 Type 2 diabetes mellitus with hyperglycemia: Secondary | ICD-10-CM | POA: Diagnosis not present

## 2019-05-11 DIAGNOSIS — Z9989 Dependence on other enabling machines and devices: Secondary | ICD-10-CM

## 2019-05-11 DIAGNOSIS — I25119 Atherosclerotic heart disease of native coronary artery with unspecified angina pectoris: Secondary | ICD-10-CM | POA: Diagnosis not present

## 2019-05-11 DIAGNOSIS — I10 Essential (primary) hypertension: Secondary | ICD-10-CM

## 2019-05-11 DIAGNOSIS — G4733 Obstructive sleep apnea (adult) (pediatric): Secondary | ICD-10-CM | POA: Diagnosis not present

## 2019-05-11 DIAGNOSIS — I214 Non-ST elevation (NSTEMI) myocardial infarction: Secondary | ICD-10-CM | POA: Diagnosis not present

## 2019-05-11 DIAGNOSIS — E785 Hyperlipidemia, unspecified: Secondary | ICD-10-CM

## 2019-05-11 MED ORDER — NITROGLYCERIN 0.4 MG SL SUBL: 0.4000 mg | SUBLINGUAL_TABLET | SUBLINGUAL | 3 refills | Status: DC | PRN

## 2019-05-11 NOTE — Patient Instructions (Signed)
Medication Instructions:  Your physician recommends that you continue on your current medications as directed. Please refer to the Current Medication list given to you today.  *If you need a refill on your cardiac medications before your next appointment, please call your pharmacy*   Follow-Up: At [facility], you and your health needs are our priority.  As part of our continuing mission to provide you with exceptional heart care, we have created designated Provider Care Teams.  These Care Teams include your primary Cardiologist (physician) and Advanced Practice Providers (APPs -  Physician Assistants and Nurse Practitioners) who all work together to provide you with the care you need, when you need it.  Your next appointment:   4 months  The format for your next appointment:   Either In Person or Virtual  Provider:   Kirk Ruths, MD

## 2019-05-21 ENCOUNTER — Other Ambulatory Visit: Payer: Self-pay | Admitting: Family Medicine

## 2019-05-22 DIAGNOSIS — G4733 Obstructive sleep apnea (adult) (pediatric): Secondary | ICD-10-CM | POA: Diagnosis not present

## 2019-06-23 ENCOUNTER — Other Ambulatory Visit: Payer: Self-pay

## 2019-06-23 ENCOUNTER — Ambulatory Visit
Admission: RE | Admit: 2019-06-23 | Discharge: 2019-06-23 | Disposition: A | Discharge status: Home / Self Care | Payer: Medicare HMO | Source: Ambulatory Visit | Attending: Family Medicine | Admitting: Family Medicine

## 2019-06-23 ENCOUNTER — Encounter: Payer: Self-pay | Admitting: Family Medicine

## 2019-06-23 ENCOUNTER — Ambulatory Visit (INDEPENDENT_AMBULATORY_CARE_PROVIDER_SITE_OTHER): Payer: Medicare HMO | Admitting: Family Medicine

## 2019-06-23 ENCOUNTER — Telehealth: Payer: Self-pay | Admitting: Family Medicine

## 2019-06-23 VITALS — BP 137/75 | HR 64 | Temp 97.0°F | Ht 71.0 in | Wt 261.2 lb

## 2019-06-23 DIAGNOSIS — I10 Essential (primary) hypertension: Secondary | ICD-10-CM | POA: Diagnosis not present

## 2019-06-23 DIAGNOSIS — R600 Localized edema: Secondary | ICD-10-CM | POA: Diagnosis not present

## 2019-06-23 DIAGNOSIS — Z23 Encounter for immunization: Secondary | ICD-10-CM | POA: Diagnosis not present

## 2019-06-23 DIAGNOSIS — M7989 Other specified soft tissue disorders: Secondary | ICD-10-CM

## 2019-06-23 DIAGNOSIS — M79661 Pain in right lower leg: Secondary | ICD-10-CM | POA: Insufficient documentation

## 2019-06-23 DIAGNOSIS — E669 Obesity, unspecified: Secondary | ICD-10-CM | POA: Diagnosis not present

## 2019-06-23 IMAGING — US US EXTREM LOW VENOUS*R*
1 series · 13 of 24 positions shown · non-contrast
Comparison: None.

CLINICAL DATA: Right lower extremity pain and edema.



[Series 1: us extrem low venous*right* · 0.09mm/px · 13 of 35 slices shown]
[im 1/35]
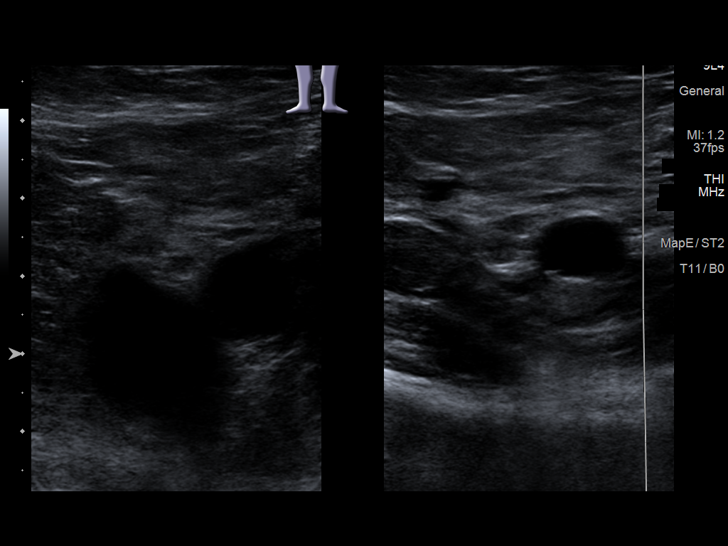
[im 3/35]
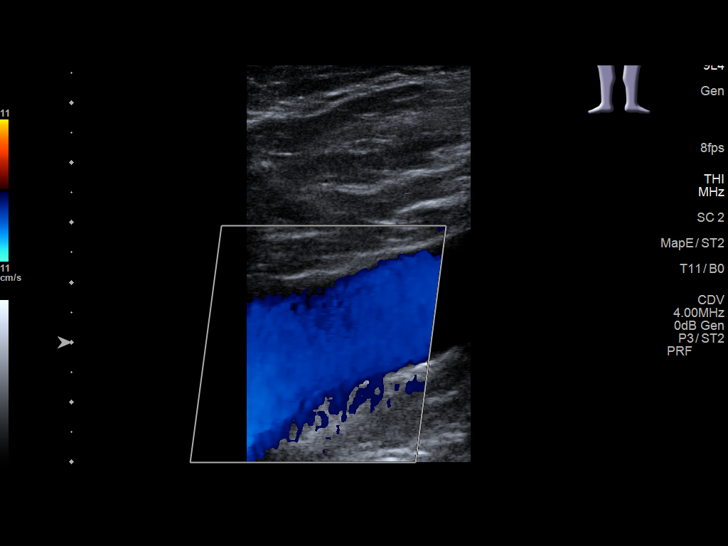
[im 6/35]
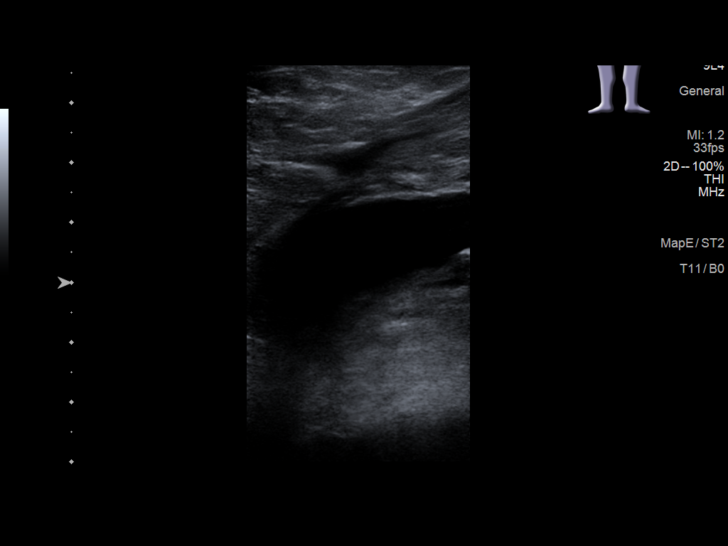
[im 9/35]
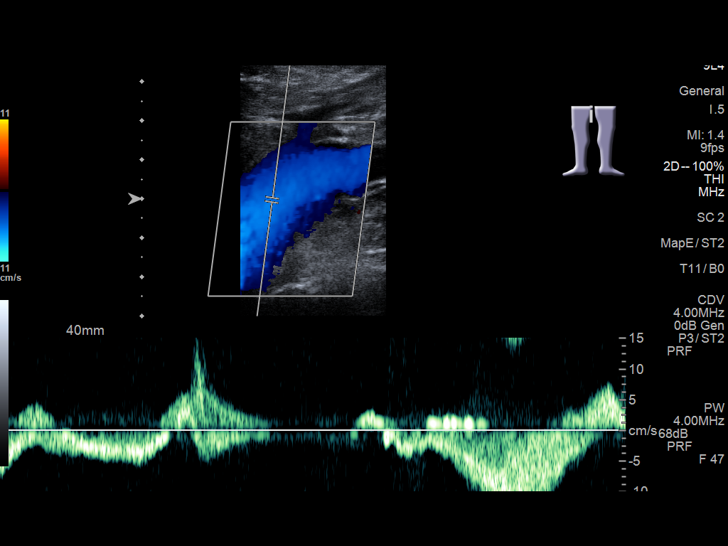
[im 12/35]
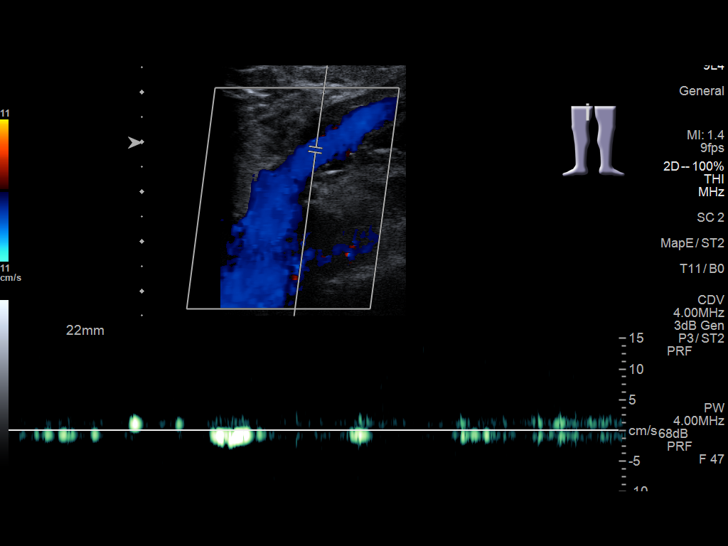
[im 15/35]
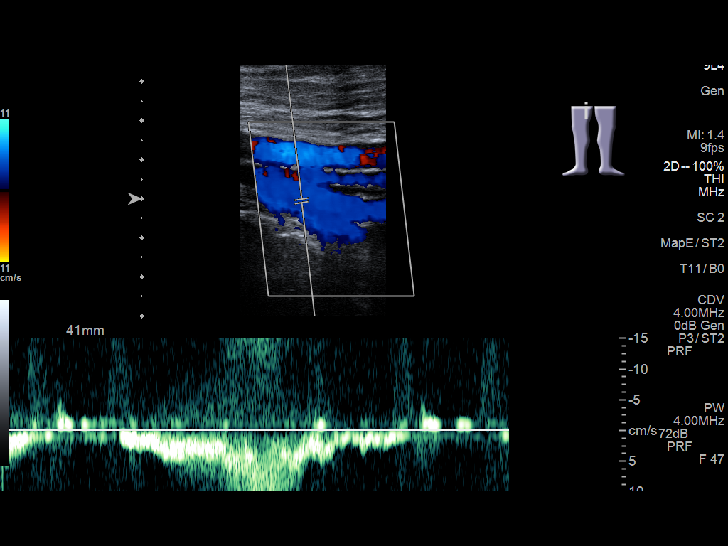
[im 18/35]
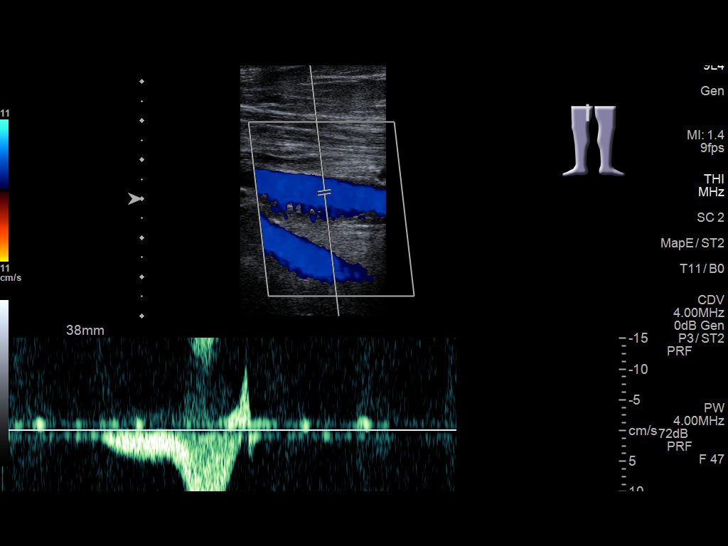
[im 20/35]
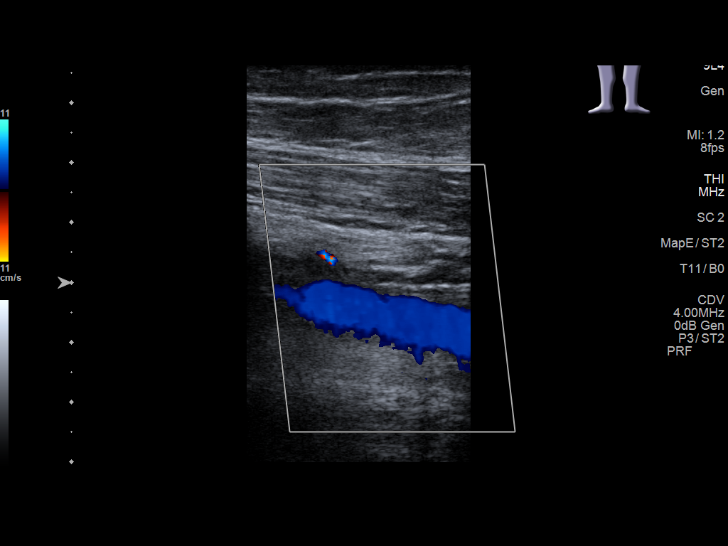
[im 23/35]
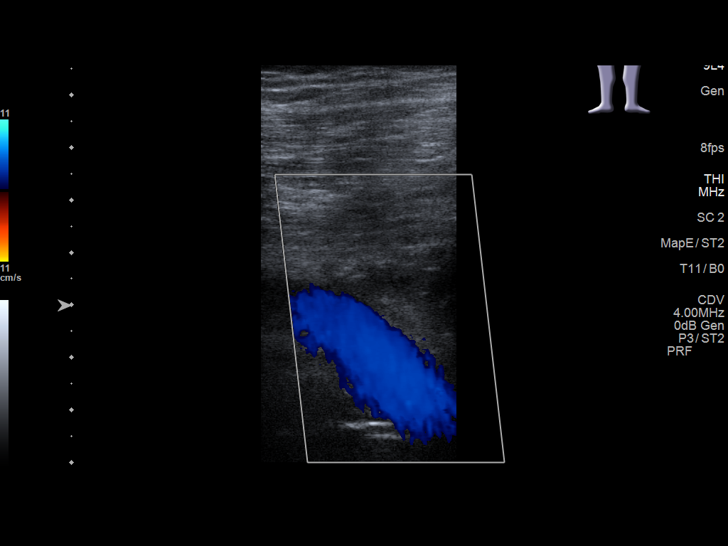
[im 26/35]
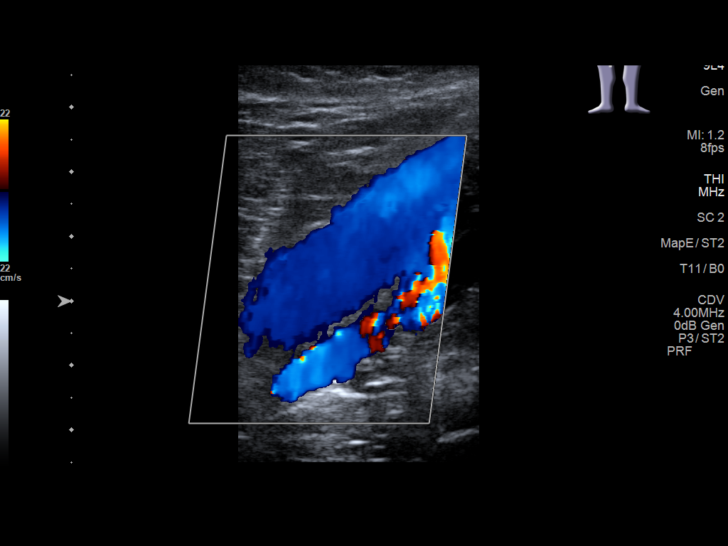
[im 29/35]
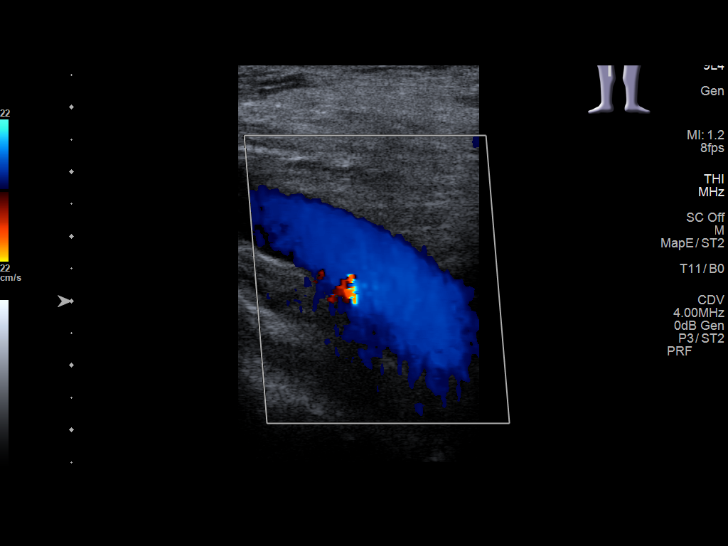
[im 32/35]
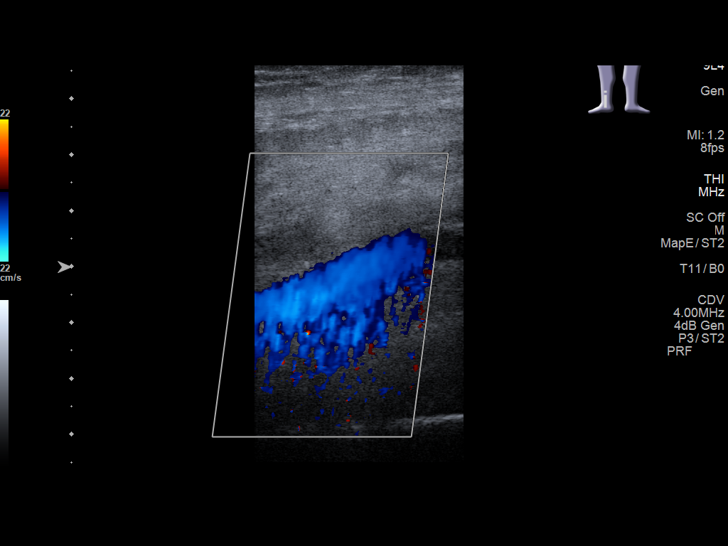
[im 35/35]
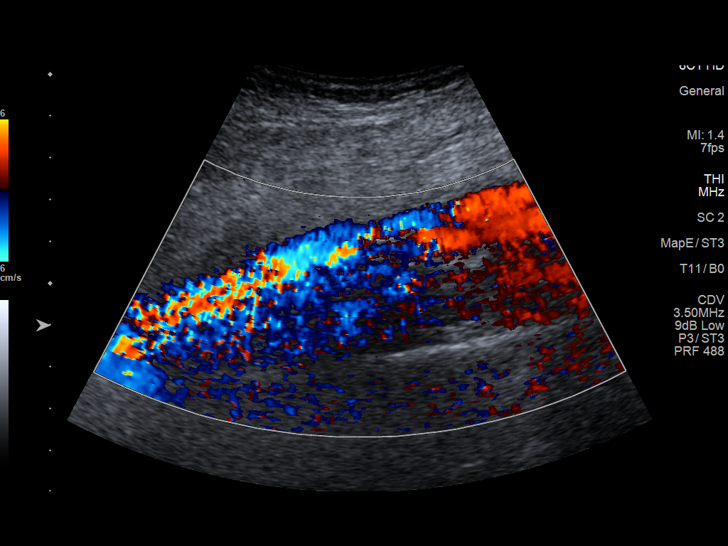

[13 of 24 positions shown; findings below may reference images not displayed]

FINDINGS: Contralateral Common Femoral Vein: Respiratory phasicity is normal
and symmetric with the symptomatic side. No evidence of thrombus.
Normal compressibility.

Common Femoral Vein: No evidence of thrombus. Normal
compressibility, respiratory phasicity and response to augmentation.

Saphenofemoral Junction: No evidence of thrombus. Normal
compressibility and flow on color Doppler imaging.

Profunda Femoral Vein: No evidence of thrombus. Normal
compressibility and flow on color Doppler imaging.

Femoral Vein: No evidence of thrombus. Normal compressibility,
respiratory phasicity and response to augmentation.

Popliteal Vein: No evidence of thrombus. Normal compressibility,
respiratory phasicity and response to augmentation.

Calf Veins: No evidence of thrombus. Normal compressibility and flow
on color Doppler imaging.

Superficial Great Saphenous Vein: No evidence of thrombus. Normal
compressibility.

Venous Reflux:  None.

Other Findings: No evidence of superficial thrombophlebitis or
abnormal fluid collection.
IMPRESSION: No evidence of right lower extremity deep venous thrombosis.

## 2019-06-23 NOTE — Progress Notes (Signed)
Subjective:    Patient ID: Ryan Garcia, male    DOB: [birth year], 85 y.o.   MRN: [id number]  This visit occurred during the SARS-CoV-2 public health emergency.  Safety protocols were in place, including screening questions prior to the visit, additional usage of staff PPE, and extensive cleaning of exam room while observing appropriate contact time as indicated for disinfecting solutions.    HPI Pt presents with a swollen R calf 2-3 weeks   Also needs a flu shot   Wt Readings from Last 3 Encounters:  06/23/19 261 lb 3 oz (118.5 kg)  05/11/19 256 lb 12.8 oz (116.5 kg)  01/16/19 256 lb 8 oz (116.3 kg)   36.43 kg/m    If he is on his feet for a long time his R calf gets larger It goes down at night  2-3 weeks  No change in medicines  Has been on his feet more this week   Felt like he pulled a muscle at times Jeoffrey Massed - better today  No discomfort above the knee  Perhaps a little red some nights  No warmth    takes hydrochlorothiazide 12.5 mg   Does not wear supp hose No problems with varicose veins   He takes 81 mg of aspirin daily   No new shortness of breath (has always been a little on exertion)  No chest discomfort  No h/o of heart failure    bp is stable today  No cp or palpitations or headaches or edema  No side effects to medicines  BP Readings from Last 3 Encounters:  06/23/19 (!) 148/70  05/11/19 118/60  01/16/19 140/78     Pulse Readings from Last 3 Encounters:  06/23/19 64  05/11/19 67  01/16/19 63   Lab Results  Component Value Date   CREATININE 1.08 01/14/2019   BUN 19 01/14/2019   NA 143 01/14/2019   K 4.3 01/14/2019   CL 108 01/14/2019   CO2 27 01/14/2019   Patient Active Problem List   Diagnosis Date Noted  . Pain and swelling of right lower leg 06/23/2019  . Coronary artery disease involving native coronary artery of native heart with angina pectoris ([place]) 01/18/2019  . Medicare annual wellness visit, subsequent 01/16/2019  .  Ecchymosis 10/22/2018  . CAP (community acquired pneumonia) 07/09/2018  . Obesity (BMI 30-39.9) 07/09/2018  . HOH (hard of hearing) 03/31/2018  . Uncontrolled type 2 diabetes mellitus with hyperglycemia ([place]) 10/28/2017  . Subclinical hypothyroidism 10/28/2017  . Scoliosis   . Obstructive sleep apnea on CPAP   . Hx of skin cancer, basal cell   . Dyslipidemia, goal LDL below 70   . GERD (gastroesophageal reflux disease)   . Chronic upper back pain   . Arthritis   . Allergy   . AC (acromioclavicular) joint bone spurs   . NSTEMI (non-ST elevated myocardial infarction) ([place]) 06/18/2017  . Actinic keratoses 04/29/2017  . Positive colorectal cancer screening using Cologuard test 10/31/2016  . BPH (benign prostatic hyperplasia) 09/04/2016  . B12 deficiency 11/22/2015  . Routine general medical examination at a health care facility 08/26/2015  . Abnormal nuclear cardiac imaging test   . Dyspnea on exertion 11/27/2013  . Encounter for examination of normal volunteer in research study 05/29/2013  . Prostate cancer screening 07/15/2012  . CAD S/P percutaneous coronary angioplasty 05/17/2011  . Nonspecific abnormal results of cardiovascular function study 04/30/2011  . Abnormal EKG 04/06/2011  . Left arm pain 04/06/2011  . Right low back  pain 01/13/2010  . Insulin dependent diabetes mellitus 10/04/2006  . ERECTILE DYSFUNCTION 10/04/2006  . Essential hypertension 10/04/2006  . ALLERGIC RHINITIS 10/04/2006  . OSTEOARTHRITIS 10/04/2006  . Sleep apnea 10/04/2006  . EDEMA 10/04/2006  . SKIN CANCER, HX OF 10/04/2006   Past Medical History:  Diagnosis Date  . AC (acromioclavicular) joint bone spurs    lt ankle  . Allergy    allergic rhinitis  . Arthritis    OA  . BPH (benign prostatic hyperplasia)    microwave tx of prostate  . CAD (coronary artery disease)    a. s/p cath in 2012 showing 70-75% LAD stenosis, 75% D1 stenosis, 75% OM1, 60-70% RCA stenosis, and 80% PDA --> medical therapy  pursued b. similar findings by cath in 2016; 06/2017 DES to distal RCA  . Chronic upper back pain   . Diabetes mellitus    type II x8 years  . GERD (gastroesophageal reflux disease)   . HLD (hyperlipidemia)    10/12: TC 208, TG 213, HDL 36, LDL 129  . Hx of skin cancer, basal cell    many skin cancers removed/under constant treatment.  . Hypertension   . NSTEMI (non-ST elevated myocardial infarction) ([place]) 06/18/2017   DES to distal RCA  . Obstructive sleep apnea on CPAP   . Scoliosis    Past Surgical History:  Procedure Laterality Date  . BREAST SURGERY  2009   breast lump removed benign  . CARDIAC CATHETERIZATION N/A 03/15/2015   Procedure: Left Heart Cath and Coronary Angiography;  Surgeon: Belva Crome, MD;  Location: [place] [facility];  Service: Cardiovascular;  Laterality: N/A;  . CORONARY STENT INTERVENTION N/A 06/18/2017   Procedure: CORONARY STENT INTERVENTION;  Surgeon: Jettie Booze, MD;  Location: [place] [facility];  Service: Cardiovascular;  Laterality: N/A;  RCA  . LEFT HEART CATH AND CORONARY ANGIOGRAPHY N/A 06/18/2017   Procedure: LEFT HEART CATH AND CORONARY ANGIOGRAPHY;  Surgeon: Jettie Booze, MD;  Location: [place] [facility];  Service: Cardiovascular;  Laterality: N/A;  . ULTRASOUND GUIDANCE FOR VASCULAR ACCESS  06/18/2017   Procedure: Ultrasound Guidance For Vascular Access;  Surgeon: Jettie Booze, MD;  Location: [place] [facility];  Service: Cardiovascular;;  right radial, right femoral   Social History   Tobacco Use  . Smoking status: Never Smoker  . Smokeless tobacco: Never Used  Substance Use Topics  . Alcohol use: No    Alcohol/week: 0.0 standard drinks  . Drug use: No   Family History  Problem Relation Age of Onset  . Heart attack Brother   . Colon cancer Neg Hx    Allergies  Allergen Reactions  . Loratadine     REACTION: not effective  . Simvastatin     REACTION: intolerant   Current Outpatient Medications on File  Prior to Visit  Medication Sig Dispense Refill  . aspirin 81 MG tablet Take 81 mg by mouth daily.      . Blood Glucose Monitoring Suppl (TRUE METRIX AIR GLUCOSE METER) W/DEVICE KIT 1 kit by Other route 2 (two) times daily. Check blood sugar twice daily and as directed. Dx E11.65 1 kit 0  . cetirizine (ZYRTEC) 10 MG tablet TAKE 1 TABLET EVERY DAY 90 tablet 1  . Coenzyme Q10 (CO Q 10 PO) Take 200 mg by mouth daily.    . Cyanocobalamin (VITAMIN B12 PO) Take 1,000 mg by mouth daily.     . DROPLET PEN NEEDLES 31G X 6 MM MISC  USE ONE TIME DAILY  AT  BEDTIME  WITH  LANTUS 100 each 1  . glipiZIDE (GLUCOTROL) 10 MG tablet TAKE 1 TABLET TWICE DAILY WITH MEALS 180 tablet 1  . glucose blood (TRUE METRIX BLOOD GLUCOSE TEST) test strip Use to check blood sugar twice a day E11.9, Z79.4 200 each 4  . hydrochlorothiazide (HYDRODIURIL) 12.5 MG tablet TAKE 1 TABLET BY MOUTH DAILY. 90 tablet 3  . Insulin Glargine (LANTUS SOLOSTAR) 100 UNIT/ML Solostar Pen INJECT  45 UNITS SUBCUTANEOUSLY AT BEDTIME 45 mL 3  . isosorbide mononitrate (IMDUR) 30 MG 24 hr tablet TAKE 1 TABLET EVERY DAY 90 tablet 1  . lisinopril (ZESTRIL) 20 MG tablet TAKE 1 TABLET EVERY DAY 90 tablet 3  . metFORMIN (GLUCOPHAGE) 1000 MG tablet TAKE 1 TABLET TWICE DAILY 180 tablet 1  . metoprolol tartrate (LOPRESSOR) 25 MG tablet TAKE 1/2 TABLET TWICE DAILY 90 tablet 3  . nitroGLYCERIN (NITROSTAT) 0.4 MG SL tablet Place 1 tablet (0.4 mg total) under the tongue every 5 (five) minutes as needed for chest pain. 25 tablet 3  . pravastatin (PRAVACHOL) 80 MG tablet TAKE 1 TABLET EVERY EVENING 90 tablet 3  . triamcinolone (NASACORT) 55 MCG/ACT AERO nasal inhaler USE ONE SPRAYS EACH NOSTRIL EVERY DAY as needed    . TRUEPLUS LANCETS 30G MISC Check blood sugar twice daily and as directed. Dx E11.65 200 each 1   No current facility-administered medications on file prior to visit.    Review of Systems  Constitutional: Negative for activity change, appetite change,  fatigue, fever and unexpected weight change.  HENT: Negative for congestion, rhinorrhea, sore throat and trouble swallowing.   Eyes: Negative for pain, redness, itching and visual disturbance.  Respiratory: Negative for cough, chest tightness, shortness of breath and wheezing.   Cardiovascular: Positive for leg swelling. Negative for chest pain and palpitations.       Some varicose veins worse on the R side/leg   Gastrointestinal: Negative for abdominal pain, blood in stool, constipation, diarrhea and nausea.  Endocrine: Negative for cold intolerance, heat intolerance, polydipsia and polyuria.  Genitourinary: Negative for difficulty urinating, dysuria, frequency and urgency.  Musculoskeletal: Negative for arthralgias, joint swelling and myalgias.  Skin: Negative for pallor and rash.  Neurological: Negative for dizziness, tremors, weakness, light-headedness, numbness and headaches.       Poor balance    Hematological: Negative for adenopathy. Does not bruise/bleed easily.  Psychiatric/Behavioral: Negative for decreased concentration and dysphoric mood. The patient is not nervous/anxious.        Objective:   Physical Exam Constitutional:      General: He is not in acute distress.    Appearance: Normal appearance. He is obese. He is not ill-appearing or diaphoretic.  HENT:     Head: Normocephalic and atraumatic.     Mouth/Throat:     Mouth: Mucous membranes are moist.  Eyes:     General: No scleral icterus.    Extraocular Movements: Extraocular movements intact.     Conjunctiva/sclera: Conjunctivae normal.     Pupils: Pupils are equal, round, and reactive to light.  Cardiovascular:     Rate and Rhythm: Normal rate and regular rhythm.     Pulses: Normal pulses.     Heart sounds: Normal heart sounds.  Pulmonary:     Effort: Pulmonary effort is normal. No respiratory distress.     Breath sounds: Normal breath sounds. No stridor. No wheezing, rhonchi or rales.     Comments: No  crackles  Musculoskeletal:  Cervical back: Normal range of motion and neck supple. No rigidity or tenderness.     Comments: Mild edema of LLE (trace)   Moderate edema of RLE with increased circumference of calf  One tortuous vein noted Neg Homan's test  No palpable cords  Some tenderness of posterior leg above the knee No erythema or warmth  Lymphadenopathy:     Cervical: No cervical adenopathy.  Skin:    General: Skin is warm and dry.     Coloration: Skin is not pale.     Findings: No erythema or rash.  Neurological:     Mental Status: He is alert. Mental status is at baseline.     Sensory: No sensory deficit.     Motor: No weakness.  Psychiatric:        Mood and Affect: Mood normal.           Assessment & Plan:   Problem List Items Addressed This Visit      Cardiovascular and Mediastinum   Essential hypertension    bp in fair control at this time  BP Readings from Last 1 Encounters:  06/23/19 137/75   No changes needed Most recent labs reviewed  Disc lifstyle change with low sodium diet and exercise  Some edema -worse on R Disc imp of lower sodium/ less processed food diet         Other   Obesity (BMI 30-39.9)    Weight gain noted  Enc better diet/ less processed foods       Pain and swelling of right lower leg - Primary    Some swelling bilaterally but noticeably worse on the right with mild tenderness of posterior leg above the knee  Differential includes venous insuff, DVT, phlebitis and less likely cardiac  Rest of exam re assuring  Ref for [place] of RLE to r/o DVT Enc elevation / avoid prolonged standing Enc water intake/ less sodium and processed carbs  Pend result  supp hose may help later on  Already takes low dose hydrochlorothiazide       Relevant Orders   US Venous Img Lower Unilateral Right

## 2019-06-23 NOTE — Telephone Encounter (Signed)
Please let pt know that the preliminary result of his doppler is negative for DVT (good!)  I will wait to see the full report when it returns

## 2019-06-23 NOTE — Assessment & Plan Note (Signed)
bp in fair control at this time  BP Readings from Last 1 Encounters:  06/23/19 137/75   No changes needed Most recent labs reviewed  Disc lifstyle change with low sodium diet and exercise  Some edema -worse on R Disc imp of lower sodium/ less processed food diet

## 2019-06-23 NOTE — Assessment & Plan Note (Signed)
Weight gain noted  Enc better diet/ less processed foods

## 2019-06-23 NOTE — Assessment & Plan Note (Signed)
Some swelling bilaterally but noticeably worse on the right with mild tenderness of posterior leg above the knee  Differential includes venous insuff, DVT, phlebitis and less likely cardiac  Rest of exam re assuring  Ref for [place] of RLE to r/o DVT Enc elevation / avoid prolonged standing Enc water intake/ less sodium and processed carbs  Pend result  supp hose may help later on  Already takes low dose hydrochlorothiazide

## 2019-06-23 NOTE — Patient Instructions (Addendum)
I'm going to refer you for an ultrasound of the right leg to look for blood clots /vein problems  The office will call you to schedule it   I think your swelling is more likely due to standing and varicose veins Support socks can help  Limit your sodium  Walk in place instead of standing when you can Elevate legs when sitting   Flu shot today

## 2019-06-23 NOTE — Telephone Encounter (Signed)
Pt notified of Dr. Tower's comments  

## 2019-07-01 ENCOUNTER — Other Ambulatory Visit: Payer: Self-pay | Admitting: Family Medicine

## 2019-07-09 ENCOUNTER — Ambulatory Visit (INDEPENDENT_AMBULATORY_CARE_PROVIDER_SITE_OTHER): Payer: Medicare HMO | Admitting: Family Medicine

## 2019-07-09 ENCOUNTER — Other Ambulatory Visit: Payer: Self-pay

## 2019-07-09 ENCOUNTER — Encounter: Payer: Self-pay | Admitting: Family Medicine

## 2019-07-09 VITALS — BP 150/60 | HR 79 | Temp 97.0°F | Ht 71.0 in | Wt 260.5 lb

## 2019-07-09 DIAGNOSIS — T25229A Burn of second degree of unspecified foot, initial encounter: Secondary | ICD-10-CM | POA: Insufficient documentation

## 2019-07-09 DIAGNOSIS — S00412A Abrasion of left ear, initial encounter: Secondary | ICD-10-CM | POA: Diagnosis not present

## 2019-07-09 DIAGNOSIS — T25221A Burn of second degree of right foot, initial encounter: Secondary | ICD-10-CM | POA: Diagnosis not present

## 2019-07-09 MED ORDER — MUPIROCIN 2 % EX OINT: 1.0000 "application " | TOPICAL_OINTMENT | Freq: Two times a day (BID) | CUTANEOUS | 0 refills | Status: DC

## 2019-07-09 NOTE — Patient Instructions (Signed)
Try some clean cotton over the spot in ear that is irritated  Keep it clean and use the px antibiotic ointment I gave you  If it does not improve - have your dermatologist take a look   Keep foot burn clean and use ointment also  Keep lightly covered and protect it  It will have to heal from the inside -this will take a while  If it does not continue to improve let me know  If increased redness or any streaks of redness let me know  Keep clean with soap and water

## 2019-07-09 NOTE — Progress Notes (Signed)
Subjective:    Patient ID: Ryan Garcia, male    DOB: 02/22/1934, 86 y.o.   MRN: [id number]  COVID-19 Labs  This visit occurred during the SARS-CoV-2 public health emergency.  Safety protocols were in place, including screening questions prior to the visit, additional usage of staff PPE, and extensive cleaning of exam room while observing appropriate contact time as indicated for disinfecting solutions.    HPI  Pt presents with R foot injury Also L ear problems   He burned his foot  Was making a burn barrel-using a welding torch  Hot steel went through his shoe and sock   This happened about a week ago  For 2-3 days he really ignored it  abx cream on band aid  Then got red and ? Pus  Wife was worried  Uses soap and water  Not numb  Was very painful when it happened   Is sore now - very mild  It is sensitive to shoe pressure   L ear - cannot wear hearing aid /the external ear has a raw sore- hurts to tuck in hearing aid  ? If skin cancer   Patient Active Problem List   Diagnosis Date Noted  . Burn, foot, second degree 07/09/2019  . Pain and swelling of right lower leg 06/23/2019  . Coronary artery disease involving native coronary artery of native heart with angina pectoris ([place]) 01/18/2019  . Medicare annual wellness visit, subsequent 01/16/2019  . Ecchymosis 10/22/2018  . CAP (community acquired pneumonia) 07/09/2018  . Obesity (BMI 30-39.9) 07/09/2018  . HOH (hard of hearing) 03/31/2018  . Uncontrolled type 2 diabetes mellitus with hyperglycemia ([place]) 10/28/2017  . Subclinical hypothyroidism 10/28/2017  . Scoliosis   . Obstructive sleep apnea on CPAP   . Hx of skin cancer, basal cell   . Dyslipidemia, goal LDL below 70   . GERD (gastroesophageal reflux disease)   . Chronic upper back pain   . Arthritis   . Allergy   . AC (acromioclavicular) joint bone spurs   . NSTEMI (non-ST elevated myocardial infarction) ([place]) 06/18/2017  . Actinic keratoses 04/29/2017    . Positive colorectal cancer screening using Cologuard test 10/31/2016  . BPH (benign prostatic hyperplasia) 09/04/2016  . B12 deficiency 11/22/2015  . Routine general medical examination at a health care facility 08/26/2015  . Abnormal nuclear cardiac imaging test   . Dyspnea on exertion 11/27/2013  . Encounter for examination of normal volunteer in research study 05/29/2013  . Prostate cancer screening 07/15/2012  . CAD S/P percutaneous coronary angioplasty 05/17/2011  . Nonspecific abnormal results of cardiovascular function study 04/30/2011  . Abnormal EKG 04/06/2011  . Left arm pain 04/06/2011  . Right low back pain 01/13/2010  . Insulin dependent diabetes mellitus 10/04/2006  . ERECTILE DYSFUNCTION 10/04/2006  . Essential hypertension 10/04/2006  . ALLERGIC RHINITIS 10/04/2006  . OSTEOARTHRITIS 10/04/2006  . Sleep apnea 10/04/2006  . EDEMA 10/04/2006  . SKIN CANCER, HX OF 10/04/2006   Past Medical History:  Diagnosis Date  . AC (acromioclavicular) joint bone spurs    lt ankle  . Allergy    allergic rhinitis  . Arthritis    OA  . BPH (benign prostatic hyperplasia)    microwave tx of prostate  . CAD (coronary artery disease)    a. s/p cath in 2012 showing 70-75% LAD stenosis, 75% D1 stenosis, 75% OM1, 60-70% RCA stenosis, and 80% PDA --> medical therapy pursued b. similar findings by cath in 2016; 06/2017 DES  to distal RCA  . Chronic upper back pain   . Diabetes mellitus    type II x8 years  . GERD (gastroesophageal reflux disease)   . HLD (hyperlipidemia)    10/12: TC 208, TG 213, HDL 36, LDL 129  . Hx of skin cancer, basal cell    many skin cancers removed/under constant treatment.  . Hypertension   . NSTEMI (non-ST elevated myocardial infarction) ([place]) 06/18/2017   DES to distal RCA  . Obstructive sleep apnea on CPAP   . Scoliosis    Past Surgical History:  Procedure Laterality Date  . BREAST SURGERY  2009   breast lump removed benign  . CARDIAC  CATHETERIZATION N/A 03/15/2015   Procedure: Left Heart Cath and Coronary Angiography;  Surgeon: Belva Crome, MD;  Location: [facility];  Service: Cardiovascular;  Laterality: N/A;  . CORONARY STENT INTERVENTION N/A 06/18/2017   Procedure: CORONARY STENT INTERVENTION;  Surgeon: Jettie Booze, MD;  Location: [facility];  Service: Cardiovascular;  Laterality: N/A;  RCA  . LEFT HEART CATH AND CORONARY ANGIOGRAPHY N/A 06/18/2017   Procedure: LEFT HEART CATH AND CORONARY ANGIOGRAPHY;  Surgeon: Jettie Booze, MD;  Location: [facility];  Service: Cardiovascular;  Laterality: N/A;  . ULTRASOUND GUIDANCE FOR VASCULAR ACCESS  06/18/2017   Procedure: Ultrasound Guidance For Vascular Access;  Surgeon: Jettie Booze, MD;  Location: [facility];  Service: Cardiovascular;;  right radial, right femoral   Social History   Tobacco Use  . Smoking status: Never Smoker  . Smokeless tobacco: Never Used  Substance Use Topics  . Alcohol use: No    Alcohol/week: 0.0 standard drinks  . Drug use: No   Family History  Problem Relation Age of Onset  . Heart attack Brother   . Colon cancer Neg Hx    Allergies  Allergen Reactions  . Loratadine     REACTION: not effective  . Simvastatin     REACTION: intolerant   Current Outpatient Medications on File Prior to Visit  Medication Sig Dispense Refill  . aspirin 81 MG tablet Take 81 mg by mouth daily.      . Blood Glucose Monitoring Suppl (TRUE METRIX AIR GLUCOSE METER) W/DEVICE KIT 1 kit by Other route 2 (two) times daily. Check blood sugar twice daily and as directed. Dx E11.65 1 kit 0  . cetirizine (ZYRTEC) 10 MG tablet TAKE 1 TABLET EVERY DAY 90 tablet 1  . Coenzyme Q10 (CO Q 10 PO) Take 200 mg by mouth daily.    . Cyanocobalamin (VITAMIN B12 PO) Take 1,000 mg by mouth daily.     . DROPLET PEN NEEDLES 31G X 6 MM MISC USE ONE TIME DAILY  AT  BEDTIME  WITH  LANTUS 100 each 1  . glipiZIDE (GLUCOTROL) 10 MG  tablet TAKE 1 TABLET TWICE DAILY WITH MEALS 180 tablet 1  . glucose blood (TRUE METRIX BLOOD GLUCOSE TEST) test strip Use to check blood sugar twice a day E11.9, Z79.4 200 each 4  . hydrochlorothiazide (HYDRODIURIL) 12.5 MG tablet TAKE 1 TABLET BY MOUTH DAILY. 90 tablet 3  . Insulin Glargine (LANTUS SOLOSTAR) 100 UNIT/ML Solostar Pen INJECT  45 UNITS SUBCUTANEOUSLY AT BEDTIME 45 mL 3  . isosorbide mononitrate (IMDUR) 30 MG 24 hr tablet TAKE 1 TABLET EVERY DAY 90 tablet 1  . lisinopril (ZESTRIL) 20 MG tablet TAKE 1 TABLET EVERY DAY 90 tablet 3  . metFORMIN (GLUCOPHAGE) 1000 MG tablet TAKE 1  TABLET TWICE DAILY 180 tablet 1  . metoprolol tartrate (LOPRESSOR) 25 MG tablet TAKE 1/2 TABLET TWICE DAILY 90 tablet 3  . nitroGLYCERIN (NITROSTAT) 0.4 MG SL tablet Place 1 tablet (0.4 mg total) under the tongue every 5 (five) minutes as needed for chest pain. 25 tablet 3  . pravastatin (PRAVACHOL) 80 MG tablet TAKE 1 TABLET EVERY EVENING 90 tablet 3  . TRUEPLUS LANCETS 30G MISC Check blood sugar twice daily and as directed. Dx E11.65 200 each 1   No current facility-administered medications on file prior to visit.     Review of Systems  Constitutional: Negative for activity change, appetite change, fatigue, fever and unexpected weight change.  HENT: Negative for congestion, rhinorrhea, sore throat and trouble swallowing.   Eyes: Negative for pain, redness, itching and visual disturbance.  Respiratory: Negative for cough, chest tightness, shortness of breath and wheezing.   Cardiovascular: Negative for chest pain and palpitations.  Gastrointestinal: Negative for abdominal pain, blood in stool, constipation, diarrhea and nausea.  Endocrine: Negative for cold intolerance, heat intolerance, polydipsia and polyuria.  Genitourinary: Negative for difficulty urinating, dysuria, frequency and urgency.  Musculoskeletal: Negative for arthralgias, joint swelling and myalgias.  Skin: Positive for wound. Negative for  pallor and rash.  Neurological: Negative for dizziness, tremors, weakness, numbness and headaches.  Hematological: Negative for adenopathy. Does not bruise/bleed easily.  Psychiatric/Behavioral: Negative for decreased concentration and dysphoric mood. The patient is not nervous/anxious.        Objective:   Physical Exam Constitutional:      General: He is not in acute distress.    Appearance: Normal appearance. He is obese. He is not ill-appearing.  HENT:     Right Ear: Tympanic membrane, ear canal and external ear normal.     Left Ear: Tympanic membrane and ear canal normal.     Ears:     Comments: Small abrasion within skin of pinna anteriorly and superiorly No swelling or signs of infection  Correlates with edge of hearing aide     Mouth/Throat:     Mouth: Mucous membranes are moist.  Eyes:     General: No scleral icterus.    Extraocular Movements: Extraocular movements intact.     Conjunctiva/sclera: Conjunctivae normal.     Pupils: Pupils are equal, round, and reactive to light.  Cardiovascular:     Rate and Rhythm: Normal rate and regular rhythm.     Comments: Pedal pulses are diminished but palpable Pulmonary:     Effort: Pulmonary effort is normal. No respiratory distress.     Breath sounds: Normal breath sounds. No wheezing or rales.  Musculoskeletal:     Cervical back: Normal range of motion and neck supple. No rigidity.     Right lower leg: No edema.     Left lower leg: No edema.     Comments: Trace to one plus pedal edema   Lymphadenopathy:     Cervical: No cervical adenopathy.  Skin:    General: Skin is warm and dry.     Coloration: Skin is not pale.     Findings: No erythema or rash.     Comments: 1 cm oval  area of denuded skin and pale granulation tissue on dorsum of R foot  2-3 mm area of erythema surrounding  No discharge     Neurological:     General: No focal deficit present.     Mental Status: He is alert. Mental status is at baseline.      Sensory:  No sensory deficit.     Coordination: Coordination normal.     Deep Tendon Reflexes: Reflexes normal.  Psychiatric:        Mood and Affect: Mood normal.           Assessment & Plan:   Problem List Items Addressed This Visit      Nervous and Auditory   Abrasion of left ear    Within inner pinna- abraded/irritated area  Likely from a mal fitting hearing aide Urged him to get it fixed Px bactroban ointment to use bid Update if worse or no imp        Musculoskeletal and Integument   Burn, foot, second degree - Primary    1 cm second degree burn of the dorsal R foot Looks to be healing  Disc wound care- soap and water cleanse  bactroban ointment px for infection prevention  Discussed s/s of infection to watch for  As a diabetic/ need to watch closely

## 2019-07-09 NOTE — Assessment & Plan Note (Signed)
1 cm second degree burn of the dorsal R foot Looks to be healing  Disc wound care- soap and water cleanse  bactroban ointment px for infection prevention  Discussed s/s of infection to watch for  As a diabetic/ need to watch closely

## 2019-07-09 NOTE — Assessment & Plan Note (Signed)
Within inner pinna- abraded/irritated area  Likely from a mal fitting hearing aide Urged him to get it fixed Px bactroban ointment to use bid Update if worse or no imp

## 2019-07-15 ENCOUNTER — Telehealth: Payer: Self-pay

## 2019-07-15 NOTE — Telephone Encounter (Signed)
[facility] Night - Client Nonclinical Telephone Record AccessNurse Client [facility] Night - Client Client Site [place] Physician Loura Pardon - MD Contact Type Call Who Is Calling Patient / Member / Family / Caregiver Caller Name [name] Phone Number [phone] Patient Name Ryan Garcia Patient DOB 08/06/1933 Call Type Message Only Information Provided Reason for Call Request for General Office Information Initial Comment Caller states she missed a call from the office. He will be at his appt. Additional Comment Disp. Time Disposition Final User 07/14/2019 5:12:08 PM General Information Provided Yes [place], [place] Call Closed By: Caryl Bis Transaction Date/Time: 07/14/2019 5:10:23 PM (ET)

## 2019-07-15 NOTE — Telephone Encounter (Signed)
I didn't call pt, it must have been a appt reminder call

## 2019-07-16 ENCOUNTER — Telehealth: Payer: Self-pay | Admitting: Family Medicine

## 2019-07-16 DIAGNOSIS — E538 Deficiency of other specified B group vitamins: Secondary | ICD-10-CM

## 2019-07-16 DIAGNOSIS — I10 Essential (primary) hypertension: Secondary | ICD-10-CM

## 2019-07-16 DIAGNOSIS — N400 Enlarged prostate without lower urinary tract symptoms: Secondary | ICD-10-CM

## 2019-07-16 DIAGNOSIS — E1169 Type 2 diabetes mellitus with other specified complication: Secondary | ICD-10-CM

## 2019-07-16 DIAGNOSIS — E1165 Type 2 diabetes mellitus with hyperglycemia: Secondary | ICD-10-CM

## 2019-07-16 NOTE — Telephone Encounter (Signed)
-----   Message from Ellamae Sia sent at 07/08/2019  4:13 PM EST ----- Regarding: Lab orders for Friday, 1.15.21 Patient is scheduled for CPX labs, please order future labs, Thanks , Karna Christmas

## 2019-07-17 ENCOUNTER — Other Ambulatory Visit (INDEPENDENT_AMBULATORY_CARE_PROVIDER_SITE_OTHER): Payer: Medicare HMO

## 2019-07-17 ENCOUNTER — Other Ambulatory Visit: Payer: Self-pay

## 2019-07-17 DIAGNOSIS — E538 Deficiency of other specified B group vitamins: Secondary | ICD-10-CM

## 2019-07-17 DIAGNOSIS — E1169 Type 2 diabetes mellitus with other specified complication: Secondary | ICD-10-CM | POA: Diagnosis not present

## 2019-07-17 DIAGNOSIS — I10 Essential (primary) hypertension: Secondary | ICD-10-CM | POA: Diagnosis not present

## 2019-07-17 DIAGNOSIS — E785 Hyperlipidemia, unspecified: Secondary | ICD-10-CM

## 2019-07-17 DIAGNOSIS — E1165 Type 2 diabetes mellitus with hyperglycemia: Secondary | ICD-10-CM

## 2019-07-17 LAB — COMPREHENSIVE METABOLIC PANEL
ALT: 19 U/L (ref 0–53)
AST: 14 U/L (ref 0–37)
Albumin: 4.2 g/dL (ref 3.5–5.2)
Alkaline Phosphatase: 95 U/L (ref 39–117)
BUN: 23 mg/dL (ref 6–23)
CO2: 25 mEq/L (ref 19–32)
Calcium: 9.1 mg/dL (ref 8.4–10.5)
Chloride: 105 mEq/L (ref 96–112)
Creatinine, Ser: 1.15 mg/dL (ref 0.40–1.50)
GFR: 60.67 mL/min (ref >60.00)
Glucose, Bld: 209 mg/dL — ABNORMAL HIGH (ref 70–99)
Potassium: 4.3 mEq/L (ref 3.5–5.1)
Sodium: 141 mEq/L (ref 135–145)
Total Bilirubin: 0.6 mg/dL (ref 0.2–1.2)
Total Protein: 6.5 g/dL (ref 6.0–8.3)

## 2019-07-17 LAB — LIPID PANEL
Cholesterol: 141 mg/dL (ref 0–200)
HDL: 38 mg/dL — ABNORMAL LOW (ref >39.00)
LDL Cholesterol: 82 mg/dL (ref 0–99)
NonHDL: 103.34
Total CHOL/HDL Ratio: 4
Triglycerides: 105 mg/dL (ref 0.0–149.0)
VLDL: 21 mg/dL (ref 0.0–40.0)

## 2019-07-17 LAB — VITAMIN B12: Vitamin B-12: 447 pg/mL (ref 211–911)

## 2019-07-17 LAB — TSH: TSH: 4.23 u[IU]/mL (ref 0.35–4.50)

## 2019-07-17 LAB — HEMOGLOBIN A1C: Hgb A1c MFr Bld: 8.1 % — ABNORMAL HIGH (ref 4.6–6.5)

## 2019-07-20 ENCOUNTER — Other Ambulatory Visit: Payer: Self-pay

## 2019-07-20 ENCOUNTER — Ambulatory Visit (INDEPENDENT_AMBULATORY_CARE_PROVIDER_SITE_OTHER): Payer: Medicare HMO | Admitting: Family Medicine

## 2019-07-20 ENCOUNTER — Encounter: Payer: Self-pay | Admitting: Family Medicine

## 2019-07-20 VITALS — BP 140/70 | HR 61 | Temp 97.2°F | Ht 71.0 in | Wt 259.1 lb

## 2019-07-20 DIAGNOSIS — E1165 Type 2 diabetes mellitus with hyperglycemia: Secondary | ICD-10-CM

## 2019-07-20 DIAGNOSIS — E785 Hyperlipidemia, unspecified: Secondary | ICD-10-CM | POA: Diagnosis not present

## 2019-07-20 DIAGNOSIS — S00412A Abrasion of left ear, initial encounter: Secondary | ICD-10-CM | POA: Diagnosis not present

## 2019-07-20 DIAGNOSIS — J3089 Other allergic rhinitis: Secondary | ICD-10-CM

## 2019-07-20 DIAGNOSIS — E669 Obesity, unspecified: Secondary | ICD-10-CM

## 2019-07-20 DIAGNOSIS — Z794 Long term (current) use of insulin: Secondary | ICD-10-CM | POA: Diagnosis not present

## 2019-07-20 DIAGNOSIS — I25119 Atherosclerotic heart disease of native coronary artery with unspecified angina pectoris: Secondary | ICD-10-CM | POA: Diagnosis not present

## 2019-07-20 DIAGNOSIS — E1169 Type 2 diabetes mellitus with other specified complication: Secondary | ICD-10-CM

## 2019-07-20 DIAGNOSIS — E038 Other specified hypothyroidism: Secondary | ICD-10-CM

## 2019-07-20 DIAGNOSIS — E039 Hypothyroidism, unspecified: Secondary | ICD-10-CM

## 2019-07-20 DIAGNOSIS — I10 Essential (primary) hypertension: Secondary | ICD-10-CM | POA: Diagnosis not present

## 2019-07-20 MED ORDER — LANTUS SOLOSTAR 100 UNIT/ML ~~LOC~~ SOPN: PEN_INJECTOR | SUBCUTANEOUS | 3 refills | Status: DC

## 2019-07-20 NOTE — Assessment & Plan Note (Signed)
Lab Results  Component Value Date   HGBA1C 8.1 (H) 07/17/2019   This continues to go up due to dietary indiscretion and less exercise (seasonal)  Pt is not open to big lifestyle changes, but again discussed low glycemic diet  Increasing lantus to 50 u daily (if low glucose alert) F/u in 3 mo  Sent for eye exam report

## 2019-07-20 NOTE — Assessment & Plan Note (Signed)
Disc goals for lipids and reasons to control them Rev last labs with pt Rev low sat fat diet in detail Taking 80 mg of pravastatin (intol to other statins) Disc goal of LDL 70 or below  Urged to limit the fatty meats

## 2019-07-20 NOTE — Progress Notes (Signed)
Subjective:    Patient ID: Ryan Garcia, male    DOB: 1933-12-31, 86 y.o.   MRN: [id number]  This visit occurred during the SARS-CoV-2 public health emergency.  Safety protocols were in place, including screening questions prior to the visit, additional usage of staff PPE, and extensive cleaning of exam room while observing appropriate contact time as indicated for disinfecting solutions.    HPI Pt presents for f/u of chronic health problems   Feeling ok   Weight  Wt Readings from Last 3 Encounters:  07/20/19 259 lb 2 oz (117.5 kg)  07/09/19 260 lb 8 oz (118.2 kg)  06/23/19 261 lb 3 oz (118.5 kg)   36.14 kg/m   bp is stable today -improved on 2nd check BP: 140/70   No cp or palpitations or headaches or edema  No side effects to medicines  BP Readings from Last 3 Encounters:  07/09/19 (!) 150/60  06/23/19 137/75  05/11/19 118/60    In setting of known CAD Has cardiology appt in march  No missed medicine   Pulse Readings from Last 3 Encounters:  07/20/19 61  07/09/19 79  06/23/19 64       Lab Results  Component Value Date   CREATININE 1.15 07/17/2019   BUN 23 07/17/2019   NA 141 07/17/2019   K 4.3 07/17/2019   CL 105 07/17/2019   CO2 25 07/17/2019   Lab Results  Component Value Date   ALT 19 07/17/2019   AST 14 07/17/2019   ALKPHOS 95 07/17/2019   BILITOT 0.6 07/17/2019     DM2 treated with glipizide and metformin and lantus  Lab Results  Component Value Date   HGBA1C 8.1 (H) 07/17/2019   This is up from 7.5  In the past pt has resisted any type of lifestyle change He has eaten a lot of sweets lately  (over holidays more than usual)  Takes ace and statin  He is less active in the winter  Eye care -less than a year ago/ he goes to [facility]   (he says vision is generally not very good but this is not new)  Had a burn on his foot at last visit -that is improved Glucose is 209 fasting labs   He takes 44 units of lantus daily  Checks his  glucose am and pm -- 150s or more in am and over 200 at night    H/o subclinical hypothyroidism Lab Results  Component Value Date   TSH 4.23 07/17/2019    Hyperlipidemia  Lab Results  Component Value Date   CHOL 141 07/17/2019   CHOL 128 01/14/2019   CHOL 116 04/28/2018   Lab Results  Component Value Date   HDL 38.00 (L) 07/17/2019   HDL 36.20 (L) 01/14/2019   HDL 32.30 (L) 04/28/2018   Lab Results  Component Value Date   LDLCALC 82 07/17/2019   LDLCALC 71 01/14/2019   LDLCALC 59 04/28/2018   Lab Results  Component Value Date   TRIG 105.0 07/17/2019   TRIG 103.0 01/14/2019   TRIG 124.0 04/28/2018   Lab Results  Component Value Date   CHOLHDL 4 07/17/2019   CHOLHDL 4 01/14/2019   CHOLHDL 4 04/28/2018   Lab Results  Component Value Date   LDLDIRECT 78.5 11/17/2012   LDLDIRECT 156.5 05/09/2010   LDLDIRECT 152.9 08/09/2009   Statin - max dose of pravastatin  He has not eaten well  More fatty foods  He eats sausage and bacon with eggs  2-3 times per week as well as ham   Patient Active Problem List   Diagnosis Date Noted  . Burn, foot, second degree 07/09/2019  . Abrasion of left ear 07/09/2019  . Pain and swelling of right lower leg 06/23/2019  . Coronary artery disease involving native coronary artery of native heart with angina pectoris ([place]) 01/18/2019  . Medicare annual wellness visit, subsequent 01/16/2019  . Ecchymosis 10/22/2018  . Obesity (BMI 30-39.9) 07/09/2018  . HOH (hard of hearing) 03/31/2018  . Uncontrolled type 2 diabetes mellitus with hyperglycemia ([place]) 10/28/2017  . Subclinical hypothyroidism 10/28/2017  . Scoliosis   . Obstructive sleep apnea on CPAP   . Hx of skin cancer, basal cell   . Hyperlipidemia associated with type 2 diabetes mellitus ([place])   . GERD (gastroesophageal reflux disease)   . Chronic upper back pain   . Arthritis   . Allergy   . AC (acromioclavicular) joint bone spurs   . NSTEMI (non-ST elevated myocardial  infarction) ([place]) 06/18/2017  . Actinic keratoses 04/29/2017  . Positive colorectal cancer screening using Cologuard test 10/31/2016  . BPH (benign prostatic hyperplasia) 09/04/2016  . B12 deficiency 11/22/2015  . Routine general medical examination at a health care facility 08/26/2015  . Abnormal nuclear cardiac imaging test   . Dyspnea on exertion 11/27/2013  . Encounter for examination of normal volunteer in research study 05/29/2013  . Prostate cancer screening 07/15/2012  . CAD S/P percutaneous coronary angioplasty 05/17/2011  . Nonspecific abnormal results of cardiovascular function study 04/30/2011  . Abnormal EKG 04/06/2011  . Left arm pain 04/06/2011  . Right low back pain 01/13/2010  . Insulin dependent diabetes mellitus 10/04/2006  . ERECTILE DYSFUNCTION 10/04/2006  . Essential hypertension 10/04/2006  . Allergic rhinitis 10/04/2006  . OSTEOARTHRITIS 10/04/2006  . Sleep apnea 10/04/2006  . EDEMA 10/04/2006  . SKIN CANCER, HX OF 10/04/2006   Past Medical History:  Diagnosis Date  . AC (acromioclavicular) joint bone spurs    lt ankle  . Allergy    allergic rhinitis  . Arthritis    OA  . BPH (benign prostatic hyperplasia)    microwave tx of prostate  . CAD (coronary artery disease)    a. s/p cath in 2012 showing 70-75% LAD stenosis, 75% D1 stenosis, 75% OM1, 60-70% RCA stenosis, and 80% PDA --> medical therapy pursued b. similar findings by cath in 2016; 06/2017 DES to distal RCA  . Chronic upper back pain   . Diabetes mellitus    type II x8 years  . GERD (gastroesophageal reflux disease)   . HLD (hyperlipidemia)    10/12: TC 208, TG 213, HDL 36, LDL 129  . Hx of skin cancer, basal cell    many skin cancers removed/under constant treatment.  . Hypertension   . NSTEMI (non-ST elevated myocardial infarction) ([place]) 06/18/2017   DES to distal RCA  . Obstructive sleep apnea on CPAP   . Scoliosis    Past Surgical History:  Procedure Laterality Date  . BREAST  SURGERY  2009   breast lump removed benign  . CARDIAC CATHETERIZATION N/A 03/15/2015   Procedure: Left Heart Cath and Coronary Angiography;  Surgeon: Belva Crome, MD;  Location: [facility];  Service: Cardiovascular;  Laterality: N/A;  . CORONARY STENT INTERVENTION N/A 06/18/2017   Procedure: CORONARY STENT INTERVENTION;  Surgeon: Jettie Booze, MD;  Location: [facility];  Service: Cardiovascular;  Laterality: N/A;  RCA  . LEFT HEART CATH AND CORONARY ANGIOGRAPHY  N/A 06/18/2017   Procedure: LEFT HEART CATH AND CORONARY ANGIOGRAPHY;  Surgeon: Jettie Booze, MD;  Location: [facility];  Service: Cardiovascular;  Laterality: N/A;  . ULTRASOUND GUIDANCE FOR VASCULAR ACCESS  06/18/2017   Procedure: Ultrasound Guidance For Vascular Access;  Surgeon: Jettie Booze, MD;  Location: [facility];  Service: Cardiovascular;;  right radial, right femoral   Social History   Tobacco Use  . Smoking status: Never Smoker  . Smokeless tobacco: Never Used  Substance Use Topics  . Alcohol use: No    Alcohol/week: 0.0 standard drinks  . Drug use: No   Family History  Problem Relation Age of Onset  . Heart attack Brother   . Colon cancer Neg Hx    Allergies  Allergen Reactions  . Loratadine     REACTION: not effective  . Simvastatin     REACTION: intolerant   Current Outpatient Medications on File Prior to Visit  Medication Sig Dispense Refill  . aspirin 81 MG tablet Take 81 mg by mouth daily.      . Blood Glucose Monitoring Suppl (TRUE METRIX AIR GLUCOSE METER) W/DEVICE KIT 1 kit by Other route 2 (two) times daily. Check blood sugar twice daily and as directed. Dx E11.65 1 kit 0  . cetirizine (ZYRTEC) 10 MG tablet TAKE 1 TABLET EVERY DAY 90 tablet 1  . Coenzyme Q10 (CO Q 10 PO) Take 200 mg by mouth daily.    . Cyanocobalamin (VITAMIN B12 PO) Take 1,000 mg by mouth daily.     . DROPLET PEN NEEDLES 31G X 6 MM MISC USE ONE TIME DAILY  AT  BEDTIME  WITH   LANTUS 100 each 1  . fluticasone (FLONASE) 50 MCG/ACT nasal spray Place 1 spray into both nostrils daily.    Marland Kitchen glipiZIDE (GLUCOTROL) 10 MG tablet TAKE 1 TABLET TWICE DAILY WITH MEALS 180 tablet 1  . glucose blood (TRUE METRIX BLOOD GLUCOSE TEST) test strip Use to check blood sugar twice a day E11.9, Z79.4 200 each 4  . hydrochlorothiazide (HYDRODIURIL) 12.5 MG tablet TAKE 1 TABLET BY MOUTH DAILY. 90 tablet 3  . isosorbide mononitrate (IMDUR) 30 MG 24 hr tablet TAKE 1 TABLET EVERY DAY 90 tablet 1  . lisinopril (ZESTRIL) 20 MG tablet TAKE 1 TABLET EVERY DAY 90 tablet 3  . metFORMIN (GLUCOPHAGE) 1000 MG tablet TAKE 1 TABLET TWICE DAILY 180 tablet 1  . metoprolol tartrate (LOPRESSOR) 25 MG tablet TAKE 1/2 TABLET TWICE DAILY 90 tablet 3  . nitroGLYCERIN (NITROSTAT) 0.4 MG SL tablet Place 1 tablet (0.4 mg total) under the tongue every 5 (five) minutes as needed for chest pain. 25 tablet 3  . pravastatin (PRAVACHOL) 80 MG tablet TAKE 1 TABLET EVERY EVENING 90 tablet 3  . TRUEPLUS LANCETS 30G MISC Check blood sugar twice daily and as directed. Dx E11.65 200 each 1   No current facility-administered medications on file prior to visit.    Review of Systems  Constitutional: Negative for activity change, appetite change, fatigue, fever and unexpected weight change.  HENT: Negative for congestion, rhinorrhea, sore throat and trouble swallowing.   Eyes: Negative for pain, redness, itching and visual disturbance.  Respiratory: Negative for cough, chest tightness, shortness of breath and wheezing.        Some baseline sob on exertion   Cardiovascular: Negative for chest pain and palpitations.  Gastrointestinal: Negative for abdominal pain, blood in stool, constipation, diarrhea and nausea.  Endocrine: Negative for cold intolerance, heat  intolerance, polydipsia and polyuria.  Genitourinary: Negative for difficulty urinating, dysuria, frequency and urgency.  Musculoskeletal: Negative for arthralgias, joint  swelling and myalgias.  Skin: Negative for pallor and rash.       Burn wound on R foot is improving   Neurological: Negative for dizziness, tremors, weakness, numbness and headaches.  Hematological: Negative for adenopathy. Does not bruise/bleed easily.  Psychiatric/Behavioral: Negative for decreased concentration and dysphoric mood. The patient is not nervous/anxious.        Lack of motivation        Objective:   Physical Exam Constitutional:      General: He is not in acute distress.    Appearance: Normal appearance. He is well-developed. He is obese. He is not ill-appearing.  HENT:     Head: Normocephalic and atraumatic.     Ears:     Comments: Poor hearing   No longer has pain in L ext ear Wearing hearing aides     Mouth/Throat:     Mouth: Mucous membranes are moist.  Eyes:     General: No scleral icterus.    Conjunctiva/sclera: Conjunctivae normal.     Pupils: Pupils are equal, round, and reactive to light.  Neck:     Thyroid: No thyromegaly.     Vascular: No carotid bruit or JVD.  Cardiovascular:     Rate and Rhythm: Normal rate and regular rhythm.     Heart sounds: Normal heart sounds. No gallop.   Pulmonary:     Effort: Pulmonary effort is normal. No respiratory distress.     Breath sounds: Normal breath sounds. No wheezing or rales.  Abdominal:     General: Bowel sounds are normal. There is no distension or abdominal bruit.     Palpations: Abdomen is soft. There is no mass.     Tenderness: There is no abdominal tenderness.  Musculoskeletal:     Cervical back: Normal range of motion and neck supple.  Lymphadenopathy:     Cervical: No cervical adenopathy.  Skin:    General: Skin is warm and dry.     Coloration: Skin is not pale.     Findings: No erythema or rash.     Comments: Burn wound on R dorsal foot is improved- small scab less than 1 cm  Neurological:     Mental Status: He is alert.     Coordination: Coordination normal.     Deep Tendon Reflexes:  Reflexes are normal and symmetric. Reflexes normal.  Psychiatric:        Mood and Affect: Mood normal.           Assessment & Plan:   Problem List Items Addressed This Visit      Cardiovascular and Mediastinum   Essential hypertension    bp in fair control at this time  BP Readings from Last 1 Encounters:  07/20/19 140/70  better on 2nd check today Pt has cardiology appt in march No changes needed Most recent labs reviewed  Disc lifstyle change with low sodium diet and exercise        Coronary artery disease involving native coronary artery of native heart with angina pectoris ([place])    No angina today Cardiology appt scheduled in march On a statin         Respiratory   Allergic rhinitis    Using flonase and zyrtec         Endocrine   Hyperlipidemia associated with type 2 diabetes mellitus ([place])    Disc goals  for lipids and reasons to control them Rev last labs with pt Rev low sat fat diet in detail Taking 80 mg of pravastatin (intol to other statins) Disc goal of LDL 70 or below  Urged to limit the fatty meats      Relevant Medications   Insulin Glargine (LANTUS SOLOSTAR) 100 UNIT/ML Solostar Pen   Uncontrolled type 2 diabetes mellitus with hyperglycemia ([facility]) - Primary    Lab Results  Component Value Date   HGBA1C 8.1 (H) 07/17/2019   This continues to go up due to dietary indiscretion and less exercise (seasonal)  Pt is not open to big lifestyle changes, but again discussed low glycemic diet  Increasing lantus to 50 u daily (if low glucose alert) F/u in 3 mo  Sent for eye exam report      Relevant Medications   Insulin Glargine (LANTUS SOLOSTAR) 100 UNIT/ML Solostar Pen   Subclinical hypothyroidism    TSH is in the nl range today Lab Results  Component Value Date   TSH 4.23 07/17/2019           Nervous and Auditory   Abrasion of left ear    Improved after he repositioned his hearing aide        Other   Obesity (BMI 30-39.9)     Discussed how this problem influences overall health and the risks it imposes  Reviewed plan for weight loss with lower calorie diet (via better food choices and also portion control or program like weight watchers) and exercise building up to or more than 30 minutes 5 days per week including some aerobic activity         Relevant Medications   Insulin Glargine (LANTUS SOLOSTAR) 100 UNIT/ML Solostar Pen

## 2019-07-20 NOTE — Assessment & Plan Note (Signed)
Discussed how this problem influences overall health and the risks it imposes  Reviewed plan for weight loss with lower calorie diet (via better food choices and also portion control or program like weight watchers) and exercise building up to or more than 30 minutes 5 days per week including some aerobic activity    

## 2019-07-20 NOTE — Assessment & Plan Note (Signed)
Improved after he repositioned his hearing aide

## 2019-07-20 NOTE — Assessment & Plan Note (Signed)
TSH is in the nl range today Lab Results  Component Value Date   TSH 4.23 07/17/2019

## 2019-07-20 NOTE — Assessment & Plan Note (Signed)
bp in fair control at this time  BP Readings from Last 1 Encounters:  07/20/19 140/70  better on 2nd check today Pt has cardiology appt in march No changes needed Most recent labs reviewed  Disc lifstyle change with low sodium diet and exercise

## 2019-07-20 NOTE — Assessment & Plan Note (Signed)
Using flonase and zyrtec

## 2019-07-20 NOTE — Assessment & Plan Note (Signed)
No angina today Cardiology appt scheduled in march On a statin

## 2019-07-20 NOTE — Patient Instructions (Addendum)
Please try to eat better for diabetes Try to get most of your carbohydrates from produce (with the exception of white potatoes)  Eat less bread/pasta/rice/snack foods/cereals/sweets and other items from the middle of the grocery store (processed carbs)   Also stay as active as you can be (in the house if you cannot get outside)   For cholesterol  Avoid red meat/ fried foods/ egg yolks/ fatty breakfast meats/ butter, cheese and high fat dairy/ and shellfish    Increase your lantus to 50 units per day  Take care of yourself  If blood sugar gets too low- cut it back and let me know   [facility] it in to [facility]   Follow up with [place] in 3 months

## 2019-07-21 DIAGNOSIS — Z08 Encounter for follow-up examination after completed treatment for malignant neoplasm: Secondary | ICD-10-CM | POA: Diagnosis not present

## 2019-07-21 DIAGNOSIS — D2262 Melanocytic nevi of left upper limb, including shoulder: Secondary | ICD-10-CM | POA: Diagnosis not present

## 2019-07-21 DIAGNOSIS — D2261 Melanocytic nevi of right upper limb, including shoulder: Secondary | ICD-10-CM | POA: Diagnosis not present

## 2019-07-21 DIAGNOSIS — Z85828 Personal history of other malignant neoplasm of skin: Secondary | ICD-10-CM | POA: Diagnosis not present

## 2019-07-21 DIAGNOSIS — D485 Neoplasm of uncertain behavior of skin: Secondary | ICD-10-CM | POA: Diagnosis not present

## 2019-07-21 DIAGNOSIS — D0439 Carcinoma in situ of skin of other parts of face: Secondary | ICD-10-CM | POA: Diagnosis not present

## 2019-07-21 DIAGNOSIS — X32XXXA Exposure to sunlight, initial encounter: Secondary | ICD-10-CM | POA: Diagnosis not present

## 2019-07-21 DIAGNOSIS — L570 Actinic keratosis: Secondary | ICD-10-CM | POA: Diagnosis not present

## 2019-07-21 DIAGNOSIS — D225 Melanocytic nevi of trunk: Secondary | ICD-10-CM | POA: Diagnosis not present

## 2019-07-21 DIAGNOSIS — L821 Other seborrheic keratosis: Secondary | ICD-10-CM | POA: Diagnosis not present

## 2019-07-21 DIAGNOSIS — D0462 Carcinoma in situ of skin of left upper limb, including shoulder: Secondary | ICD-10-CM | POA: Diagnosis not present

## 2019-07-23 ENCOUNTER — Telehealth: Payer: Self-pay | Admitting: Family Medicine

## 2019-07-23 DIAGNOSIS — E1165 Type 2 diabetes mellitus with hyperglycemia: Secondary | ICD-10-CM

## 2019-07-23 NOTE — Telephone Encounter (Signed)
Referral done  I will route to [facility]  

## 2019-07-23 NOTE — Telephone Encounter (Signed)
Lelan Pons (spouse) called wanting dr tower to put in referral to [facility] yearly check up.  Pt has not been since 2017 and first available for them to schedule End of April. They told her if they got a referral they could see pt sooner

## 2019-07-24 DIAGNOSIS — H2511 Age-related nuclear cataract, right eye: Secondary | ICD-10-CM | POA: Diagnosis not present

## 2019-07-24 DIAGNOSIS — H35372 Puckering of macula, left eye: Secondary | ICD-10-CM | POA: Diagnosis not present

## 2019-07-24 LAB — HM DIABETES EYE EXAM

## 2019-07-30 ENCOUNTER — Encounter: Payer: Self-pay | Admitting: Ophthalmology

## 2019-08-18 DIAGNOSIS — D0439 Carcinoma in situ of skin of other parts of face: Secondary | ICD-10-CM | POA: Diagnosis not present

## 2019-08-25 DIAGNOSIS — D0462 Carcinoma in situ of skin of left upper limb, including shoulder: Secondary | ICD-10-CM | POA: Diagnosis not present

## 2019-08-31 NOTE — Progress Notes (Signed)
HPI: FU CAD.Cardiac catheterization in the past has revealed significant three-vessel coronary artery disease. I reviewed those films with Dr. Percival Spanish and Dr. Lia Foyer and we felt medical therapy best option (pt also wanted to avoid CABG).Patient admitted December 2018 and ruled in for non-ST elevation myocardial infarction. Patient underwent repeat catheterization revealing severe three-vessel coronary disease. Left-sided disease was unchanged compared to previous catheterizations and the culprit was felt to be a new RCA lesion and he had PCI with DES. Echocardiogram December 2018 showed normal LV systolic function and mild left atrial enlargement. Since last seen,  he has dyspnea on exertion unchanged.  No orthopnea or PND.  No chest pain or syncope.  Current Outpatient Medications  Medication Sig Dispense Refill  . aspirin 81 MG tablet Take 81 mg by mouth daily.      . Blood Glucose Monitoring Suppl (TRUE METRIX AIR GLUCOSE METER) W/DEVICE KIT 1 kit by Other route 2 (two) times daily. Check blood sugar twice daily and as directed. Dx E11.65 1 kit 0  . cetirizine (ZYRTEC) 10 MG tablet TAKE 1 TABLET EVERY DAY 90 tablet 1  . Coenzyme Q10 (CO Q 10 PO) Take 200 mg by mouth daily.    . Cyanocobalamin (VITAMIN B12 PO) Take 1,000 mg by mouth daily.     . DROPLET PEN NEEDLES 31G X 6 MM MISC USE ONE TIME DAILY  AT  BEDTIME  WITH  LANTUS 100 each 1  . fluticasone (FLONASE) 50 MCG/ACT nasal spray Place 1 spray into both nostrils daily.    Marland Kitchen glipiZIDE (GLUCOTROL) 10 MG tablet TAKE 1 TABLET TWICE DAILY WITH MEALS 180 tablet 1  . glucose blood (TRUE METRIX BLOOD GLUCOSE TEST) test strip Use to check blood sugar twice a day E11.9, Z79.4 200 each 4  . hydrochlorothiazide (HYDRODIURIL) 12.5 MG tablet TAKE 1 TABLET BY MOUTH DAILY. 90 tablet 3  . Insulin Glargine (LANTUS SOLOSTAR) 100 UNIT/ML Solostar Pen INJECT  50 UNITS SUBCUTANEOUSLY AT BEDTIME 50 mL 3  . isosorbide mononitrate (IMDUR) 30 MG 24 hr  tablet TAKE 1 TABLET EVERY DAY 90 tablet 1  . lisinopril (ZESTRIL) 20 MG tablet TAKE 1 TABLET EVERY DAY 90 tablet 3  . metFORMIN (GLUCOPHAGE) 1000 MG tablet TAKE 1 TABLET TWICE DAILY 180 tablet 1  . metoprolol tartrate (LOPRESSOR) 25 MG tablet TAKE 1/2 TABLET TWICE DAILY 90 tablet 3  . nitroGLYCERIN (NITROSTAT) 0.4 MG SL tablet Place 1 tablet (0.4 mg total) under the tongue every 5 (five) minutes as needed for chest pain. 25 tablet 3  . pravastatin (PRAVACHOL) 80 MG tablet TAKE 1 TABLET EVERY EVENING 90 tablet 3  . TRUEPLUS LANCETS 30G MISC Check blood sugar twice daily and as directed. Dx E11.65 200 each 1   No current facility-administered medications for this visit.     Past Medical History:  Diagnosis Date  . AC (acromioclavicular) joint bone spurs    lt ankle  . Allergy    allergic rhinitis  . Arthritis    OA  . BPH (benign prostatic hyperplasia)    microwave tx of prostate  . CAD (coronary artery disease)    a. s/p cath in 2012 showing 70-75% LAD stenosis, 75% D1 stenosis, 75% OM1, 60-70% RCA stenosis, and 80% PDA --> medical therapy pursued b. similar findings by cath in 2016; 06/2017 DES to distal RCA  . Chronic upper back pain   . Diabetes mellitus    type II x8 years  . GERD (gastroesophageal reflux  disease)   . HLD (hyperlipidemia)    10/12: TC 208, TG 213, HDL 36, LDL 129  . Hx of skin cancer, basal cell    many skin cancers removed/under constant treatment.  . Hypertension   . NSTEMI (non-ST elevated myocardial infarction) ([place]) 06/18/2017   DES to distal RCA  . Obstructive sleep apnea on CPAP   . Scoliosis     Past Surgical History:  Procedure Laterality Date  . BREAST SURGERY  2009   breast lump removed benign  . CARDIAC CATHETERIZATION N/A 03/15/2015   Procedure: Left Heart Cath and Coronary Angiography;  Surgeon: Belva Crome, MD;  Location: [facility];  Service: Cardiovascular;  Laterality: N/A;  . CORONARY STENT INTERVENTION N/A 06/18/2017    Procedure: CORONARY STENT INTERVENTION;  Surgeon: Jettie Booze, MD;  Location: [facility];  Service: Cardiovascular;  Laterality: N/A;  RCA  . LEFT HEART CATH AND CORONARY ANGIOGRAPHY N/A 06/18/2017   Procedure: LEFT HEART CATH AND CORONARY ANGIOGRAPHY;  Surgeon: Jettie Booze, MD;  Location: [facility];  Service: Cardiovascular;  Laterality: N/A;  . ULTRASOUND GUIDANCE FOR VASCULAR ACCESS  06/18/2017   Procedure: Ultrasound Guidance For Vascular Access;  Surgeon: Jettie Booze, MD;  Location: [facility];  Service: Cardiovascular;;  right radial, right femoral    Social History   Socioeconomic History  . Marital status: Married    Spouse name: Not on file  . Number of children: Not on file  . Years of education: Not on file  . Highest education level: Not on file  Occupational History  . Not on file  Tobacco Use  . Smoking status: Never Smoker  . Smokeless tobacco: Never Used  Substance and Sexual Activity  . Alcohol use: No    Alcohol/week: 0.0 standard drinks  . Drug use: No  . Sexual activity: Not Currently  Other Topics Concern  . Not on file  Social History Narrative   Married    Physically active hard of hearing   Social Determinants of Health   Financial Resource Strain:   . Difficulty of Paying Living Expenses: Not on file  Food Insecurity:   . Worried About Charity fundraiser in the Last Year: Not on file  . Ran Out of Food in the Last Year: Not on file  Transportation Needs:   . Lack of Transportation (Medical): Not on file  . Lack of Transportation (Non-Medical): Not on file  Physical Activity:   . Days of Exercise per Week: Not on file  . Minutes of Exercise per Session: Not on file  Stress:   . Feeling of Stress : Not on file  Social Connections:   . Frequency of Communication with Friends and Family: Not on file  . Frequency of Social Gatherings with Friends and Family: Not on file  . Attends Religious  Services: Not on file  . Active Member of Clubs or Organizations: Not on file  . Attends Archivist Meetings: Not on file  . Marital Status: Not on file  Intimate Partner Violence:   . Fear of Current or Ex-Partner: Not on file  . Emotionally Abused: Not on file  . Physically Abused: Not on file  . Sexually Abused: Not on file    Family History  Problem Relation Age of Onset  . Heart attack Brother   . Colon cancer Neg Hx     ROS: Patient describes larger diameter of his right calf  but no pain.  No fevers or chills, productive cough, hemoptysis, dysphasia, odynophagia, melena, hematochezia, dysuria, hematuria, rash, seizure activity, orthopnea, PND, pedal edema, claudication. Remaining systems are negative.  Physical Exam: Well-developed obese in no acute distress.  Skin is warm and dry.  HEENT is normal.  Neck is supple.  Chest is clear to auscultation with normal expansion.  Cardiovascular exam is regular rate and rhythm.  Abdominal exam nontender or distended. No masses palpated. Extremities show trace edema.  Right lower extremity calf increased diameter compared to left. neuro grossly intact  A/P  1 coronary artery disease-patient denies chest pain.  As outlined in previous notes he would like to continue to avoid coronary artery bypass and graft and we are therefore treating medically.  Continue aspirin and statin.  2 dyspnea-multifactorial including obesity hypoventilation syndrome and deconditioning.  3 hypertension-blood pressure controlled.  Continue present medical regimen.  4 hyperlipidemia-recent LDL greater than 70.  He did not tolerate Lipitor previously.  I will discontinue pravastatin and try Crestor 40 mg daily.  Check lipids and liver in 12 weeks.  If he does not tolerate we will resume pravastatin and add Zetia.  5 increased diameter right calf-no pain on palpation and only trace edema bilaterally.  Etiology unclear.  Had venous Dopplers December  showed no DVT.  Follow-up primary care.  Kirk Ruths, MD

## 2019-09-07 ENCOUNTER — Telehealth: Payer: Self-pay | Admitting: Cardiology

## 2019-09-07 NOTE — Telephone Encounter (Signed)
Wife of patient wanted to know if she would be able to come with the patient to his appointment 09-08-19. The patient is extremely hard of hearing, and while he does not have diagnosed memory issues, he would get lost trying to get up to the clinic on his own. He also would not relay the right information necessary to the doctor.   Please let the wife know what the office decides. If they are not available, please leave a message on their answering machine

## 2019-09-07 NOTE — Telephone Encounter (Signed)
Spoke with patient's spouse. Advised spouse that patient can cal her on his cell phone during the visit and Dr. Stanford Breed can speak to her over speaker phone. Spouse concerned about swelling in patient's hand and leg, will communicate that over the phone during the visit tomorrow.

## 2019-09-08 ENCOUNTER — Other Ambulatory Visit: Payer: Self-pay

## 2019-09-08 ENCOUNTER — Ambulatory Visit: Payer: Medicare HMO | Admitting: Cardiology

## 2019-09-08 ENCOUNTER — Encounter: Payer: Self-pay | Admitting: Cardiology

## 2019-09-08 VITALS — BP 132/69 | HR 68 | Temp 97.8°F | Ht 71.0 in | Wt 260.0 lb

## 2019-09-08 DIAGNOSIS — I25119 Atherosclerotic heart disease of native coronary artery with unspecified angina pectoris: Secondary | ICD-10-CM | POA: Diagnosis not present

## 2019-09-08 DIAGNOSIS — E785 Hyperlipidemia, unspecified: Secondary | ICD-10-CM | POA: Diagnosis not present

## 2019-09-08 DIAGNOSIS — I10 Essential (primary) hypertension: Secondary | ICD-10-CM

## 2019-09-08 MED ORDER — ROSUVASTATIN CALCIUM 40 MG PO TABS: 40.0000 mg | ORAL_TABLET | Freq: Every day | ORAL | 3 refills | Status: DC

## 2019-09-08 NOTE — Patient Instructions (Signed)
Medication Instructions:  STOP PRAVASTATIN  START ROSUVASTATIN 40 MG ONCE DAILY  *If you need a refill on your cardiac medications before your next appointment, please call your pharmacy*   Lab Work: Your physician recommends that you return for lab work in: [place]  If you have labs (blood work) drawn today and your tests are completely normal, you will receive your results only by: Marland Kitchen MyChart Message (if you have MyChart) OR . A paper copy in the mail If you have any lab test that is abnormal or we need to change your treatment, we will call you to review the results.  Follow-Up: At [facility], you and your health needs are our priority.  As part of our continuing mission to provide you with exceptional heart care, we have created designated Provider Care Teams.  These Care Teams include your primary Cardiologist (physician) and Advanced Practice Providers (APPs -  Physician Assistants and Nurse Practitioners) who all work together to provide you with the care you need, when you need it.  We recommend signing up for the patient portal called "MyChart".  Sign up information is provided on this After Visit Summary.  MyChart is used to connect with patients for Virtual Visits (Telemedicine).  Patients are able to view lab/test results, encounter notes, upcoming appointments, etc.  Non-urgent messages can be sent to your provider as well.   To learn more about what you can do with MyChart, go to [email or web address].    Your next appointment:   6 month(s)  The format for your next appointment:   Either In Person or Virtual  Provider:   You may see Kirk Ruths, MD or one of the following Advanced Practice Providers on your designated Care Team:    Kerin Ransom, PA-C  [place], [place]  Coletta Memos, [place]

## 2019-09-09 DIAGNOSIS — D0439 Carcinoma in situ of skin of other parts of face: Secondary | ICD-10-CM | POA: Diagnosis not present

## 2019-09-15 DIAGNOSIS — E785 Hyperlipidemia, unspecified: Secondary | ICD-10-CM | POA: Diagnosis not present

## 2019-09-16 ENCOUNTER — Encounter: Payer: Self-pay | Admitting: *Deleted

## 2019-09-16 LAB — LIPID PANEL
Chol/HDL Ratio: 3.8 ratio (ref 0.0–5.0)
Cholesterol, Total: 126 mg/dL (ref 100–199)
HDL: 33 mg/dL — ABNORMAL LOW (ref >39)
LDL Chol Calc (NIH): 59 mg/dL (ref 0–99)
Triglycerides: 204 mg/dL — ABNORMAL HIGH (ref 0–149)
VLDL Cholesterol Cal: 34 mg/dL (ref 5–40)

## 2019-09-16 LAB — HEPATIC FUNCTION PANEL
ALT: 17 IU/L (ref 0–44)
AST: 14 IU/L (ref 0–40)
Albumin: 4.1 g/dL (ref 3.6–4.6)
Alkaline Phosphatase: 99 IU/L (ref 39–117)
Bilirubin Total: 0.3 mg/dL (ref 0.0–1.2)
Bilirubin, Direct: 0.14 mg/dL (ref 0.00–0.40)
Total Protein: 6.5 g/dL (ref 6.0–8.5)

## 2019-10-19 ENCOUNTER — Other Ambulatory Visit: Payer: Self-pay

## 2019-10-19 ENCOUNTER — Encounter: Payer: Self-pay | Admitting: Family Medicine

## 2019-10-19 ENCOUNTER — Ambulatory Visit (INDEPENDENT_AMBULATORY_CARE_PROVIDER_SITE_OTHER): Payer: Medicare HMO | Admitting: Family Medicine

## 2019-10-19 VITALS — BP 126/80 | HR 51 | Temp 96.9°F | Ht 71.0 in | Wt 253.2 lb

## 2019-10-19 DIAGNOSIS — E785 Hyperlipidemia, unspecified: Secondary | ICD-10-CM | POA: Diagnosis not present

## 2019-10-19 DIAGNOSIS — E1165 Type 2 diabetes mellitus with hyperglycemia: Secondary | ICD-10-CM | POA: Diagnosis not present

## 2019-10-19 DIAGNOSIS — E1169 Type 2 diabetes mellitus with other specified complication: Secondary | ICD-10-CM | POA: Diagnosis not present

## 2019-10-19 DIAGNOSIS — I10 Essential (primary) hypertension: Secondary | ICD-10-CM | POA: Diagnosis not present

## 2019-10-19 LAB — POCT GLYCOSYLATED HEMOGLOBIN (HGB A1C): Hemoglobin A1C: 7.1 % — AB (ref 4.0–5.6)

## 2019-10-19 NOTE — Assessment & Plan Note (Signed)
Disc goals for lipids and reasons to control them Rev last labs with pt Rev low sat fat diet in detail Has been on several statins (most recently crestor) and now back to max dose pravastatin  Last LD 59 (thinks he was taking atorvastatin then)  followec by cardiology

## 2019-10-19 NOTE — Assessment & Plan Note (Signed)
bp in fair control at this time  BP Readings from Last 1 Encounters:  10/19/19 126/80   No changes needed Rev most recent cardiology note-no changes made Most recent labs reviewed  Disc lifstyle change with low sodium diet and exercise

## 2019-10-19 NOTE — Assessment & Plan Note (Signed)
Lab Results  Component Value Date   HGBA1C 7.1 (A) 10/19/2019   Improved wit inc of lantus to 50 u daily  Also eating a little better  More activity in spring/summer Enc low glycemic diet and wt loss

## 2019-10-19 NOTE — Patient Instructions (Signed)
Continue current medications  Try to stick to a diabetic diet  (but don't skip meals)  Stay active   Follow up in 3 months for your annual exam   Take care of yourself
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
## 2019-11-04 ENCOUNTER — Other Ambulatory Visit: Payer: Self-pay | Admitting: Cardiology

## 2019-11-16 ENCOUNTER — Telehealth: Payer: Self-pay | Admitting: Family Medicine

## 2019-11-16 NOTE — Progress Notes (Signed)
  Chronic Care Management   Outreach Note  11/16/2019 Name: Ryan Garcia MRN: [id number] DOB: 1933-10-25  Referred by: Tower, Wynelle Fanny, MD Reason for referral : No chief complaint on file.   An unsuccessful telephone outreach was attempted today. The patient was referred to the pharmacist for assistance with care management and care coordination.  This note is not being shared with the patient for the following reason: To respect privacy (The patient or proxy has requested that the information not be shared). Follow Up Plan:   Earney [place] Upstream Scheduler

## 2019-11-17 ENCOUNTER — Other Ambulatory Visit: Payer: Self-pay | Admitting: Cardiology

## 2019-11-17 ENCOUNTER — Other Ambulatory Visit: Payer: Self-pay

## 2019-11-17 ENCOUNTER — Telehealth: Payer: Self-pay

## 2019-11-17 ENCOUNTER — Telehealth: Payer: Self-pay | Admitting: Cardiology

## 2019-11-17 DIAGNOSIS — I10 Essential (primary) hypertension: Secondary | ICD-10-CM

## 2019-11-17 MED ORDER — ISOSORBIDE MONONITRATE ER 30 MG PO TB24: 30.0000 mg | ORAL_TABLET | Freq: Every day | ORAL | 3 refills | Status: DC

## 2019-11-17 NOTE — Telephone Encounter (Signed)
Refill for imdur 30mg  sent to preferred pharmacy

## 2019-11-17 NOTE — Telephone Encounter (Signed)
[facility] - Client TELEPHONE ADVICE RECORD [facility] Patient Name: Ryan Garcia Gender: Male DOB: [birth year] Age: 86 Y 40 M 27 D Return Phone Number: [phone] (Primary), [phone] (Secondary) [place] [address] Client [facility] - Client Client Site [place] Tower, Roque Lias - MD Contact Type Call Who Is Calling Patient / Member / Family / Caregiver Call Type Triage / Clinical Relationship To Patient Self Return Phone Number [phone] (Primary) Chief Complaint NUMBNESS/TINGLING- sudden on one side of the body or face Reason for Call Symptomatic / Request for [place] states she need to send a patient to be triaged. He is experiencing tingling and numbness in his left arm. Translation No Nurse Assessment Nurse: Harlow Mares, RN, Suanne Marker Date/Time Eilene Ghazi Time): 11/17/2019 11:14:04 AM Confirm and document reason for call. If symptomatic, describe symptoms. ---He is experiencing tingling and numbness in his left arm; pain in left elbow and shoulder. The tingling and numbness occurs at night. He had an injury while using a chain saw about 1 month ago; felt like a pulled muscle. Reports a lot of pain in the left arm last night, with a stabbing pain in his elbow and shoulder. Denies any chest discomfort. Has the patient had close contact with a person known or suspected to have the novel coronavirus illness OR traveled / lives in area with major community spread (including international travel) in the last 14 days from the onset of symptoms? * If Asymptomatic, screen for exposure and travel within the last 14 days. ---No Does the patient have any new or worsening symptoms? ---Yes Will a triage be completed? ---Yes Related visit to physician within the last 2 weeks? ---No Does the PT have any chronic conditions? (i.e.  diabetes, asthma, this includes High risk factors for pregnancy, etc.) ---Yes List chronic conditions. ---CAD; HTN; diabetes; Is this a behavioral health or substance abuse call? ---No PLEASE NOTE: All timestamps contained within this report are represented as [place] Standard Time. CONFIDENTIALTY NOTICE: This fax transmission is intended only for the addressee. It contains information that is legally privileged, confidential or otherwise protected from use or disclosure. If you are not the intended recipient, you are strictly prohibited from reviewing, disclosing, copying using or disseminating any of this information or taking any action in reliance on or regarding this information. If you have received this fax in error, please notify us immediately by telephone so that we can arrange for its return to [place]. Phone: [phone], Toll-Free: [phone], Fax: [phone] Page: 2 of 2 Call Id: [id number] Guidelines Guideline Title Affirmed Question Affirmed Notes Nurse Date/Time Eilene Ghazi Time) Arm Pain Numbness (i.e., loss of sensation) in hand or fingers Harlow Mares, RN, Suanne Marker 11/17/2019 11:18:45 AM Disp. Time Eilene Ghazi Time) Disposition Final User 11/17/2019 11:09:03 AM Send to Urgent Queue Renne Crigler 11/17/2019 11:29:34 AM See PCP within 24 Hours Yes Harlow Mares, RN, Rosalyn Charters Disagree/Comply Comply Caller Understands Yes PreDisposition Did not know what to do Care Advice Given Per Guideline SEE PCP WITHIN 24 HOURS: * IF OFFICE WILL BE OPEN: You need to be seen within the next 24 hours. Call your doctor (or NP/PA) when the office opens and make an appointment. CALL BACK IF: * You become worse. CARE ADVICE given per Arm Pain (Adult) guideline. Referrals REFERRED TO PCP OFFICE

## 2019-11-17 NOTE — Telephone Encounter (Signed)
I spoke with pts wife; and pt already has appt to see Dr Glori Bickers on 11/18/19 at 9:30 AM. UC & ED precautions given. pts wife voiced understanding and said  this has been going on since used a chain saw about 1 month ago. Pt is outside mowing now. FYI to Dr Glori Bickers.

## 2019-11-17 NOTE — Telephone Encounter (Signed)
   *  STAT* If patient is at the pharmacy, call can be transferred to refill team.   1. Which medications need to be refilled? (please list name of each medication and dose if known)   isosorbide mononitrate (IMDUR) 30 MG 24 hr tablet     2. Which pharmacy/location (including street and city if local pharmacy) is medication to be sent to? [place]  3. Do they need a 30 day or 90 day supply? 90 days  Pt is only have 2 pill left, please send emergency refill to [facility], [place]

## 2019-11-17 NOTE — Telephone Encounter (Signed)
Aware, will see him then °

## 2019-11-18 ENCOUNTER — Encounter: Payer: Self-pay | Admitting: Family Medicine

## 2019-11-18 ENCOUNTER — Ambulatory Visit (INDEPENDENT_AMBULATORY_CARE_PROVIDER_SITE_OTHER)
Admission: RE | Admit: 2019-11-18 | Discharge: 2019-11-18 | Disposition: A | Discharge status: Home / Self Care | Payer: Medicare HMO | Source: Ambulatory Visit | Attending: Family Medicine | Admitting: Family Medicine

## 2019-11-18 ENCOUNTER — Other Ambulatory Visit: Payer: Self-pay

## 2019-11-18 ENCOUNTER — Ambulatory Visit (INDEPENDENT_AMBULATORY_CARE_PROVIDER_SITE_OTHER): Payer: Medicare HMO | Admitting: Family Medicine

## 2019-11-18 DIAGNOSIS — M25522 Pain in left elbow: Secondary | ICD-10-CM

## 2019-11-18 IMAGING — DX DG ELBOW COMPLETE 3+V*L*
3 series · 3 of 3 positions shown · non-contrast
Comparison: None

CLINICAL DATA: Lateral LEFT elbow pain with tingling down to hand

EXAM:
LEFT ELBOW - COMPLETE 3+ VIEW

[elbow ap]
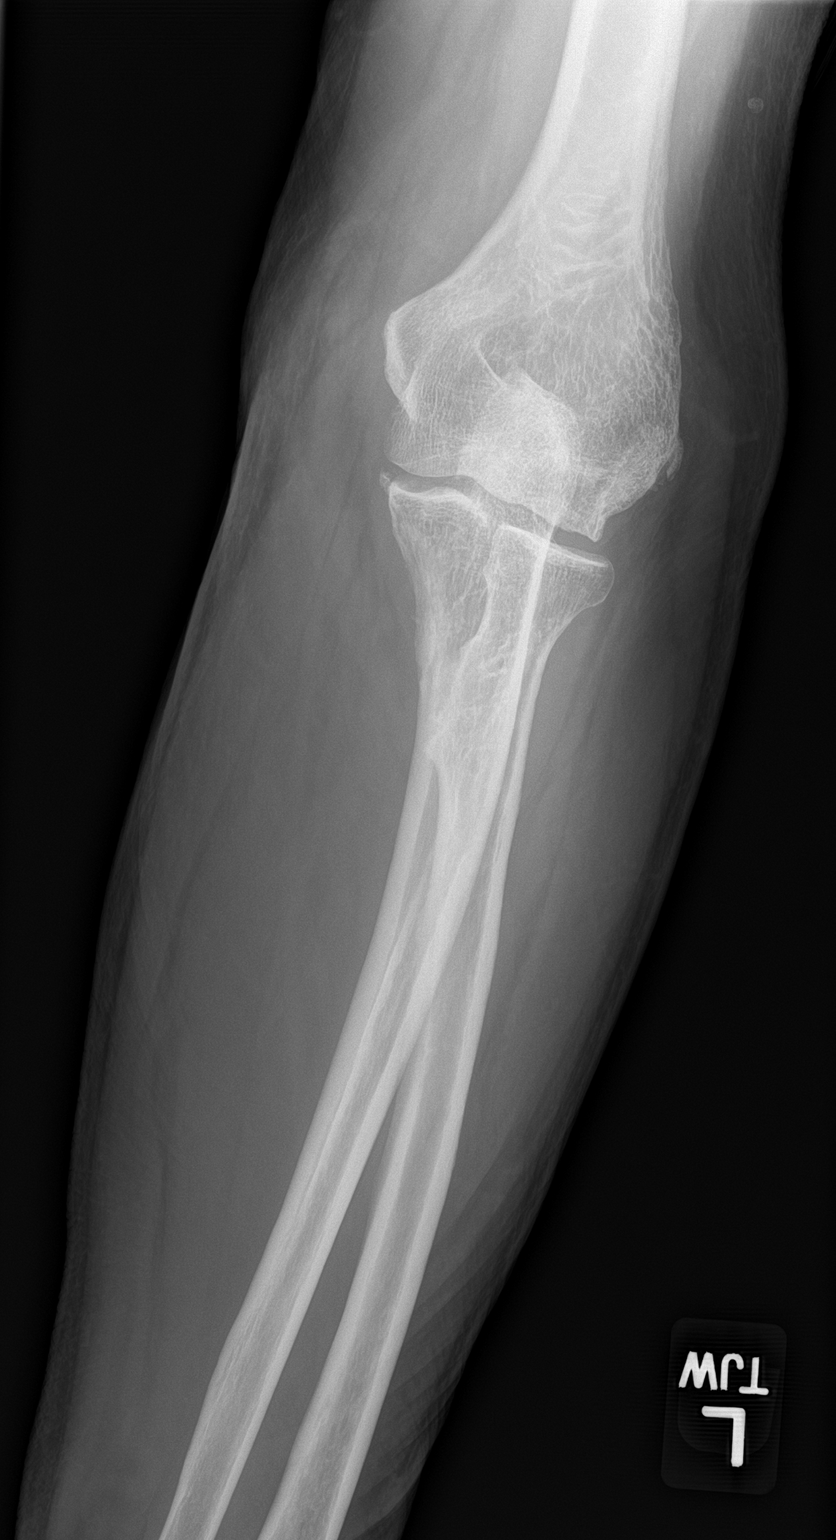

[elbow obl]
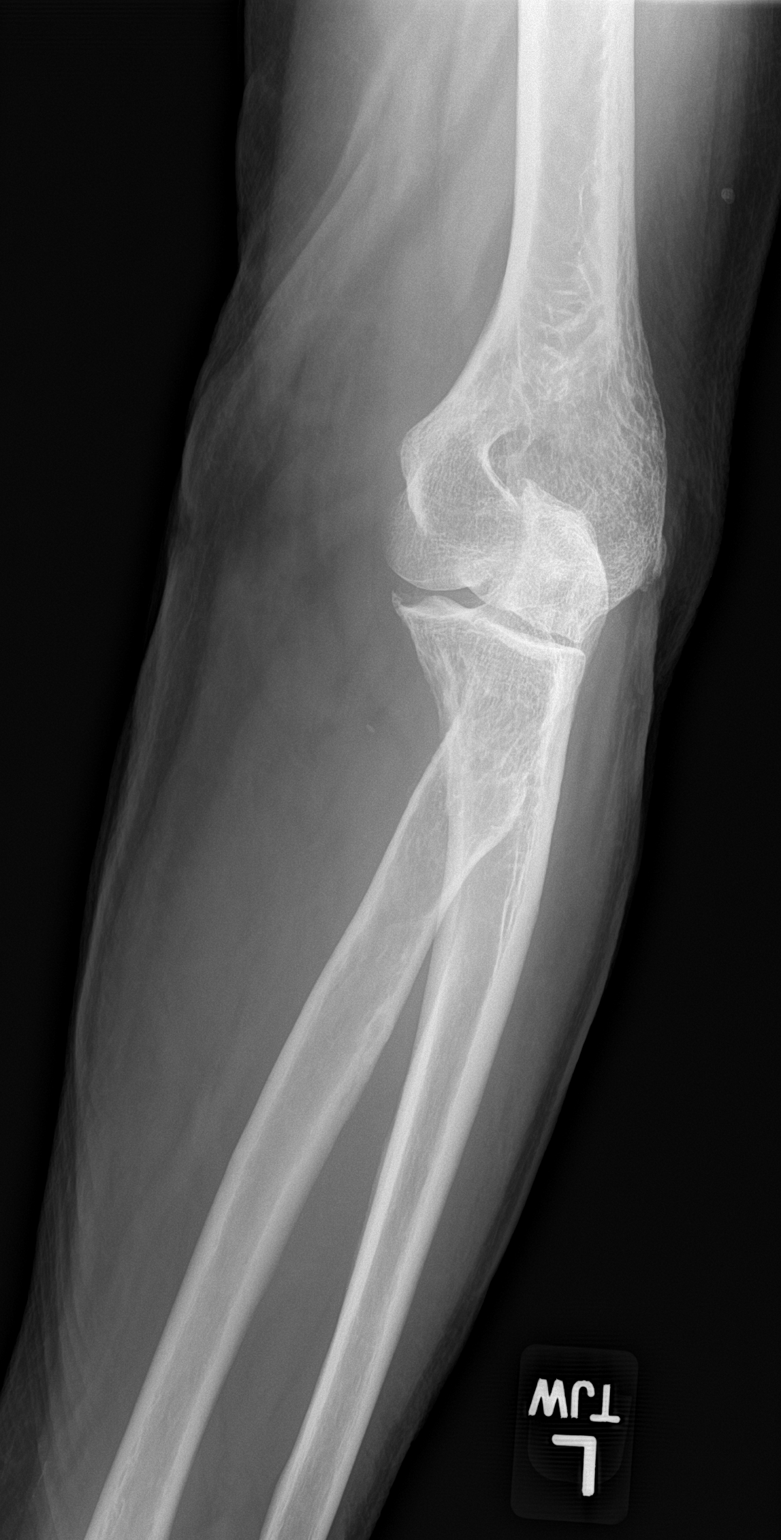

[elbow lat]
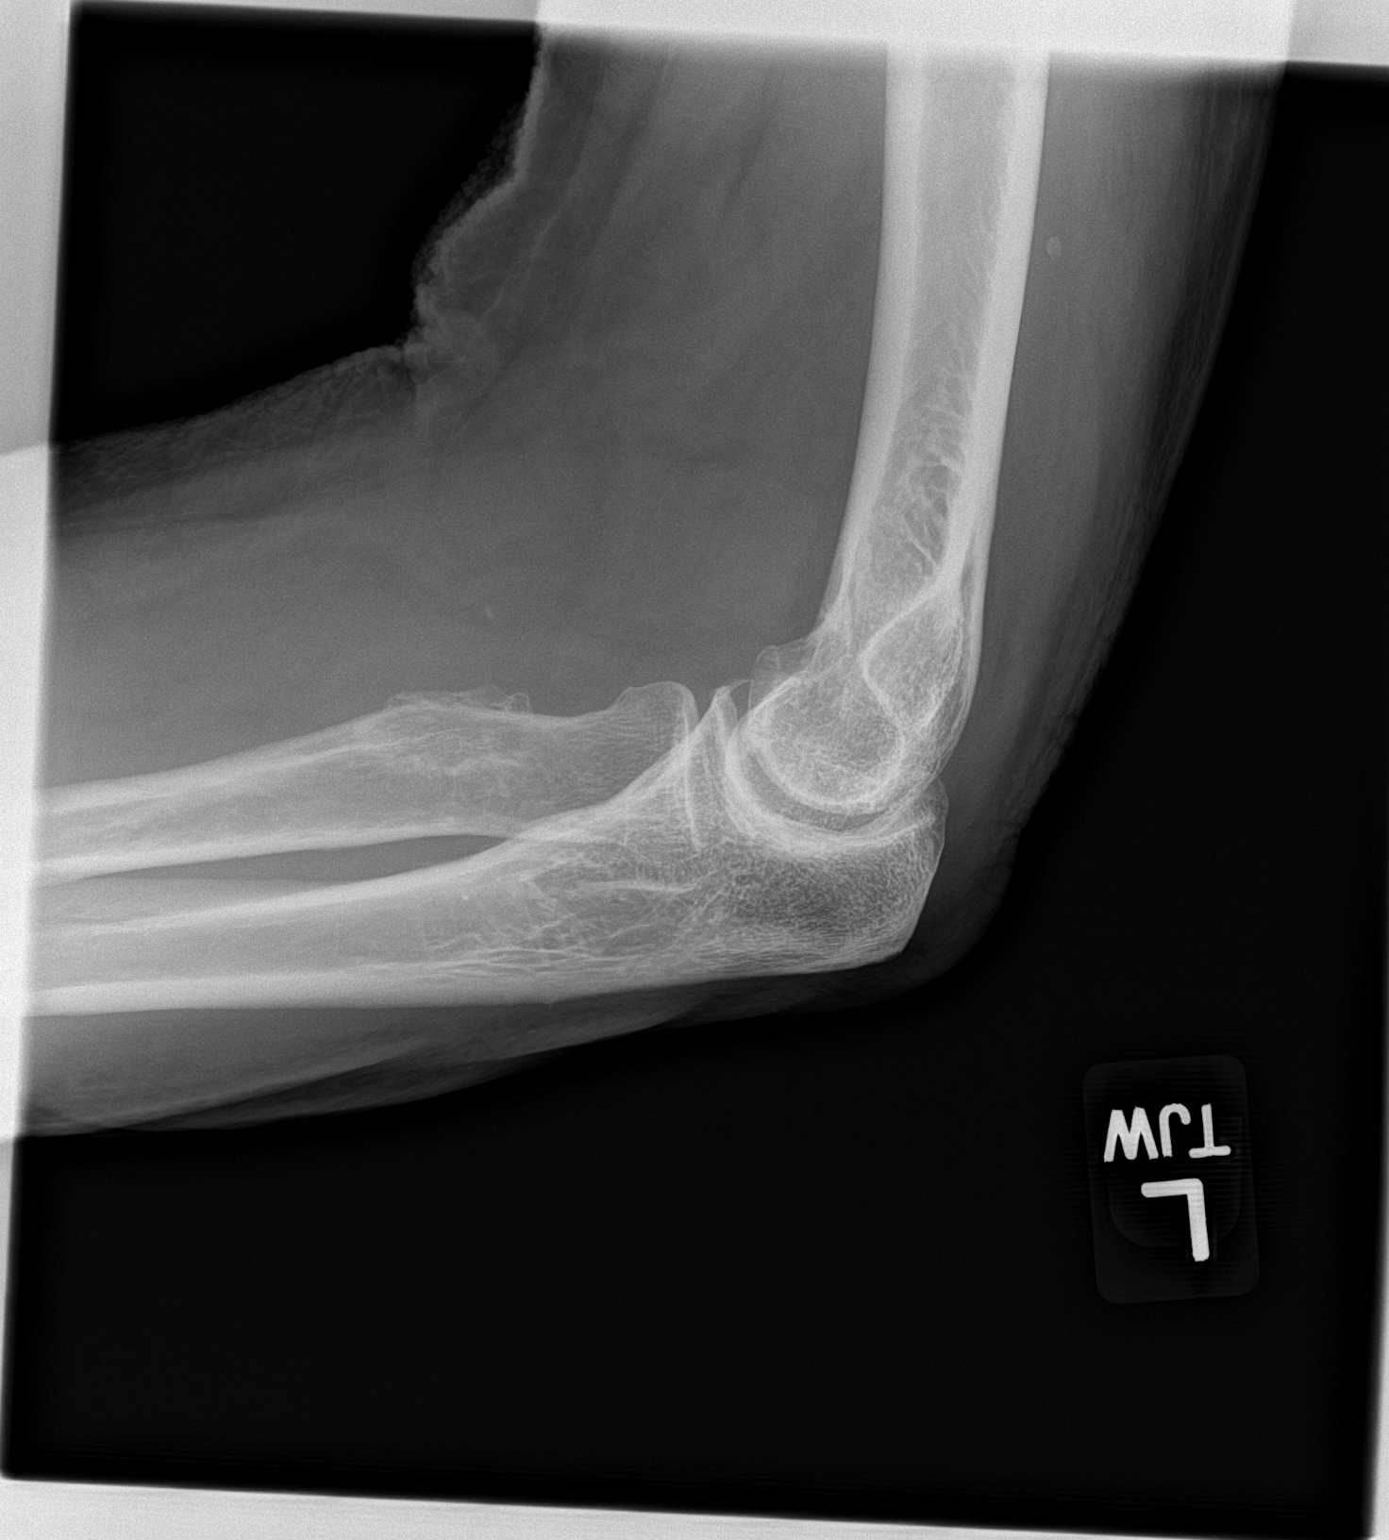

[3 of 3 positions shown; findings below may reference images not displayed]

FINDINGS: Osseous demineralization.

Joint spaces preserved.

No acute fracture, dislocation, or bone destruction.

Minimal spurring at lateral humeral epicondyle.

Normal joint effusion.
IMPRESSION: Osseous demineralization with minimal spurring at lateral humeral
epicondyle.

Otherwise negative exam.

## 2019-11-18 MED ORDER — DICLOFENAC SODIUM 1 % EX GEL: 2.0000 g | Freq: Four times a day (QID) | CUTANEOUS | 3 refills | Status: DC

## 2019-11-18 NOTE — Progress Notes (Signed)
Subjective:    Patient ID: Ryan Garcia, male    DOB: [birth year], 86 y.o.   MRN: [id number]  This visit occurred during the SARS-CoV-2 public health emergency.  Safety protocols were in place, including screening questions prior to the visit, additional usage of staff PPE, and extensive cleaning of exam room while observing appropriate contact time as indicated for disinfecting solutions.    HPI Pt presents with c/o L arm pain    Wt Readings from Last 3 Encounters:  11/18/19 256 lb (116.1 kg)  10/19/19 253 lb 4 oz (114.9 kg)  09/08/19 260 lb (117.9 kg)   35.70 kg/m   About 3 months ago - he felt like he pulled a muscle in forearm/elbow area lifting  He took it easy   Has been on and off   No swelling  No redness or warmth    Right now his L hand feels tingly - thumb and 2 fingers  Occ shoots up arm  Turning his hand makes his elbow hurt  Fleeting sharp pains   In bed -this will bother him - tries to change position  Tingling and uncomfortable   Of note-many years ago- he may have injured elbow in football     Upon review of the record  Pt had L shoulder film in 2014   (no other LUE imaging)  He is not having shoulder pain currently  DG Shoulder Left (Accession [id number]) (Order [id number]) Imaging Date: 05/10/2013 Department: [place] Released By/Authorizing: Merryl Hacker, MD (auto-released)  Exam Status  Status  Final [99]  PACS Intelerad Image Link  Show images for DG Shoulder Left  Study Result  CLINICAL DATA:  Pain. No injury.  EXAM: LEFT SHOULDER - 2+ VIEW  COMPARISON:  None.  FINDINGS: There is minimal degenerative change of the [facility] joint. There is no evidence of acute fracture or dislocation.  IMPRESSION: No acute findings.   Electronically Signed   By: Marin Olp M.D.   On: 05/10/2013 13:54      Review of Systems  Constitutional: Negative for activity change, appetite change,  fatigue, fever and unexpected weight change.  HENT: Negative for congestion, rhinorrhea, sore throat and trouble swallowing.   Eyes: Negative for pain, redness, itching and visual disturbance.  Respiratory: Negative for cough, chest tightness, shortness of breath and wheezing.   Cardiovascular: Negative for chest pain and palpitations.  Gastrointestinal: Negative for abdominal pain, blood in stool, constipation, diarrhea and nausea.  Endocrine: Negative for cold intolerance, heat intolerance, polydipsia and polyuria.  Genitourinary: Negative for difficulty urinating, dysuria, frequency and urgency.  Musculoskeletal: Positive for arthralgias and back pain. Negative for joint swelling and myalgias.  Skin: Negative for pallor and rash.  Neurological: Negative for dizziness, tremors, weakness, numbness and headaches.       Tingling of L forearm with elbow pain  Hematological: Negative for adenopathy. Does not bruise/bleed easily.  Psychiatric/Behavioral: Negative for decreased concentration and dysphoric mood. The patient is not nervous/anxious.        Objective:   Physical Exam Constitutional:      General: He is not in acute distress.    Appearance: Normal appearance. He is obese. He is not ill-appearing.  Eyes:     General: No scleral icterus.    Conjunctiva/sclera: Conjunctivae normal.     Pupils: Pupils are equal, round, and reactive to light.  Cardiovascular:     Rate and Rhythm: Normal rate and regular rhythm.  Pulses: Normal pulses.  Pulmonary:     Effort: Pulmonary effort is normal. No respiratory distress.     Breath sounds: No wheezing.  Musculoskeletal:        General: Tenderness present. No swelling.     Left elbow: No swelling, deformity or effusion. Normal range of motion. Tenderness present in lateral epicondyle and olecranon process.     Left forearm: No swelling, deformity, tenderness or bony tenderness.     Left wrist: No swelling, deformity, tenderness, bony  tenderness, snuff box tenderness or crepitus. Normal pulse.     Cervical back: Normal range of motion and neck supple. No tenderness.     Comments: Pain in L elbow with pronation and supination    Lymphadenopathy:     Cervical: No cervical adenopathy.  Neurological:     Mental Status: He is alert.     Sensory: No sensory deficit.     Motor: No weakness.     Coordination: Coordination normal.     Deep Tendon Reflexes: Reflexes normal.     Comments: Neg tinel and phalen tests in L wrist   Nl DTRs Nl strength and sensation   Psychiatric:        Mood and Affect: Mood normal.           Assessment & Plan:   Problem List Items Addressed This Visit      Other   Left elbow pain    Lateral epicondyle soreness and tingling down forearm with certain positions incl pronation/supination  Xray today to r/o bony cause Suspect epicondylitis /tendonitis  Recommend relative rest and use of ice  Diclofenac gel px sent -use topically qid prn  Rehab stretch handout given  If no imp will consider a sport med visit       Relevant Orders   DG Elbow Complete Left

## 2019-11-18 NOTE — Patient Instructions (Addendum)
I think you may have tendonitis in the elbow  I do want to check an xray to rule out bone spurs or other problems   Use ice for 10 minutes at a time whenever you can  Try the diclofenac gel as directed   Take a look at the handouts on tennis elbow and start doing the stretches   If no improvement  -I may have you see sport medicine doctor

## 2019-11-18 NOTE — Assessment & Plan Note (Signed)
Lateral epicondyle soreness and tingling down forearm with certain positions incl pronation/supination  Xray today to r/o bony cause Suspect epicondylitis /tendonitis  Recommend relative rest and use of ice  Diclofenac gel px sent -use topically qid prn  Rehab stretch handout given  If no imp will consider a sport med visit

## 2019-11-23 ENCOUNTER — Ambulatory Visit: Payer: Medicare HMO | Admitting: Family Medicine

## 2019-11-24 ENCOUNTER — Other Ambulatory Visit: Payer: Self-pay | Admitting: *Deleted

## 2019-11-24 ENCOUNTER — Other Ambulatory Visit: Payer: Self-pay

## 2019-11-24 MED ORDER — ISOSORBIDE MONONITRATE ER 30 MG PO TB24: 30.0000 mg | ORAL_TABLET | Freq: Every day | ORAL | 3 refills | Status: DC

## 2019-11-24 NOTE — Telephone Encounter (Signed)
Rx has been sent to the pharmacy electronically. ° °

## 2019-11-24 NOTE — Progress Notes (Deleted)
    Ryan Cronce T. [name], MD, [place]  Primary Care and [place] [facility] Ryan Garcia, [id number]  Phone: [phone]  FAX: Paint - 86 y.o. male  MRN [id number]  Date of Birth: 02-23-1934  Date: 11/25/2019  PCP: Abner Greenspan, MD  Referral: Abner Greenspan, MD  No chief complaint on file.   This visit occurred during the SARS-CoV-2 public health emergency.  Safety protocols were in place, including screening questions prior to the visit, additional usage of staff PPE, and extensive cleaning of exam room while observing appropriate contact time as indicated for disinfecting solutions.   Subjective:   Ryan Garcia is a 86 y.o. very pleasant male patient with There is no height or weight on file to calculate BMI. who presents with the following:  Patient is here for evaluation of some left-sided elbow pain.  He saw my partner Dr. Glori Bickers about 1 week ago.  The radiological images were independently reviewed by myself in the office and results were reviewed with the patient. My independent interpretation of images: Patient essentially has a entirely normal x-ray of the elbow.  There is no significant joint space loss.  He does have some very minimal osteophyte formation at the lateral humerus, but otherwise grossly unremarkable without evidence of fracture or dislocation. Electronically Signed  By: Owens Loffler, MD On: 11/25/2019  9:20 AM EDT   At that point she placed him on some Voltaren gel.    Review of Systems is noted in the HPI, as appropriate   Objective:   There were no vitals taken for this visit.  ***  Radiology: DG Elbow Complete Left  Result Date: 11/18/2019 CLINICAL DATA:  Lateral LEFT elbow pain with tingling down to hand EXAM: LEFT ELBOW - COMPLETE 3+ VIEW COMPARISON:  None FINDINGS: Osseous demineralization. Joint spaces preserved. No acute fracture, dislocation, or  bone destruction. Minimal spurring at lateral humeral epicondyle. Normal joint effusion. IMPRESSION: Osseous demineralization with minimal spurring at lateral humeral epicondyle. Otherwise negative exam. Electronically Signed   By: Lavonia Dana M.D.   On: 11/18/2019 15:25    Assessment and Plan:   ***

## 2019-11-25 ENCOUNTER — Ambulatory Visit: Payer: Medicare HMO | Admitting: Family Medicine

## 2019-11-25 ENCOUNTER — Other Ambulatory Visit: Payer: Self-pay

## 2019-11-25 DIAGNOSIS — Z0289 Encounter for other administrative examinations: Secondary | ICD-10-CM

## 2019-11-26 ENCOUNTER — Other Ambulatory Visit: Payer: Self-pay

## 2019-11-26 MED ORDER — ISOSORBIDE MONONITRATE ER 30 MG PO TB24: 30.0000 mg | ORAL_TABLET | Freq: Every day | ORAL | 3 refills | Status: DC

## 2019-12-10 DIAGNOSIS — L570 Actinic keratosis: Secondary | ICD-10-CM | POA: Diagnosis not present

## 2019-12-10 DIAGNOSIS — D225 Melanocytic nevi of trunk: Secondary | ICD-10-CM | POA: Diagnosis not present

## 2019-12-10 DIAGNOSIS — C44329 Squamous cell carcinoma of skin of other parts of face: Secondary | ICD-10-CM | POA: Diagnosis not present

## 2019-12-10 DIAGNOSIS — D2262 Melanocytic nevi of left upper limb, including shoulder: Secondary | ICD-10-CM | POA: Diagnosis not present

## 2019-12-10 DIAGNOSIS — D485 Neoplasm of uncertain behavior of skin: Secondary | ICD-10-CM | POA: Diagnosis not present

## 2019-12-10 DIAGNOSIS — Z85828 Personal history of other malignant neoplasm of skin: Secondary | ICD-10-CM | POA: Diagnosis not present

## 2019-12-10 DIAGNOSIS — D2261 Melanocytic nevi of right upper limb, including shoulder: Secondary | ICD-10-CM | POA: Diagnosis not present

## 2019-12-10 DIAGNOSIS — D2271 Melanocytic nevi of right lower limb, including hip: Secondary | ICD-10-CM | POA: Diagnosis not present

## 2019-12-10 DIAGNOSIS — X32XXXA Exposure to sunlight, initial encounter: Secondary | ICD-10-CM | POA: Diagnosis not present

## 2019-12-10 DIAGNOSIS — L821 Other seborrheic keratosis: Secondary | ICD-10-CM | POA: Diagnosis not present

## 2019-12-11 ENCOUNTER — Other Ambulatory Visit: Payer: Self-pay | Admitting: Family Medicine

## 2019-12-14 ENCOUNTER — Other Ambulatory Visit: Payer: Self-pay | Admitting: Family Medicine

## 2020-01-12 DIAGNOSIS — G4733 Obstructive sleep apnea (adult) (pediatric): Secondary | ICD-10-CM | POA: Diagnosis not present

## 2020-01-15 ENCOUNTER — Other Ambulatory Visit: Payer: Self-pay

## 2020-01-18 ENCOUNTER — Other Ambulatory Visit: Payer: Self-pay

## 2020-01-18 ENCOUNTER — Ambulatory Visit (INDEPENDENT_AMBULATORY_CARE_PROVIDER_SITE_OTHER): Payer: Medicare HMO | Admitting: Family Medicine

## 2020-01-18 ENCOUNTER — Encounter: Payer: Self-pay | Admitting: Family Medicine

## 2020-01-18 VITALS — BP 114/64 | HR 82 | Temp 97.1°F | Ht 70.75 in | Wt 250.6 lb

## 2020-01-18 DIAGNOSIS — L570 Actinic keratosis: Secondary | ICD-10-CM

## 2020-01-18 DIAGNOSIS — I10 Essential (primary) hypertension: Secondary | ICD-10-CM | POA: Diagnosis not present

## 2020-01-18 DIAGNOSIS — E1169 Type 2 diabetes mellitus with other specified complication: Secondary | ICD-10-CM | POA: Diagnosis not present

## 2020-01-18 DIAGNOSIS — E1165 Type 2 diabetes mellitus with hyperglycemia: Secondary | ICD-10-CM

## 2020-01-18 DIAGNOSIS — E785 Hyperlipidemia, unspecified: Secondary | ICD-10-CM

## 2020-01-18 DIAGNOSIS — I25119 Atherosclerotic heart disease of native coronary artery with unspecified angina pectoris: Secondary | ICD-10-CM | POA: Diagnosis not present

## 2020-01-18 DIAGNOSIS — N400 Enlarged prostate without lower urinary tract symptoms: Secondary | ICD-10-CM

## 2020-01-18 DIAGNOSIS — G4733 Obstructive sleep apnea (adult) (pediatric): Secondary | ICD-10-CM

## 2020-01-18 DIAGNOSIS — E039 Hypothyroidism, unspecified: Secondary | ICD-10-CM

## 2020-01-18 DIAGNOSIS — E538 Deficiency of other specified B group vitamins: Secondary | ICD-10-CM | POA: Diagnosis not present

## 2020-01-18 DIAGNOSIS — Z0000 Encounter for general adult medical examination without abnormal findings: Secondary | ICD-10-CM | POA: Diagnosis not present

## 2020-01-18 DIAGNOSIS — Z9989 Dependence on other enabling machines and devices: Secondary | ICD-10-CM

## 2020-01-18 DIAGNOSIS — E669 Obesity, unspecified: Secondary | ICD-10-CM | POA: Diagnosis not present

## 2020-01-18 DIAGNOSIS — H833X3 Noise effects on inner ear, bilateral: Secondary | ICD-10-CM

## 2020-01-18 DIAGNOSIS — E038 Other specified hypothyroidism: Secondary | ICD-10-CM

## 2020-01-18 DIAGNOSIS — C44329 Squamous cell carcinoma of skin of other parts of face: Secondary | ICD-10-CM | POA: Diagnosis not present

## 2020-01-18 LAB — CBC WITH DIFFERENTIAL/PLATELET
Basophils Absolute: 0 10*3/uL (ref 0.0–0.1)
Basophils Relative: 0.5 % (ref 0.0–3.0)
Eosinophils Absolute: 0.3 10*3/uL (ref 0.0–0.7)
Eosinophils Relative: 4 % (ref 0.0–5.0)
HCT: 38.2 % — ABNORMAL LOW (ref 39.0–52.0)
Hemoglobin: 13 g/dL (ref 13.0–17.0)
Lymphocytes Relative: 25.9 % (ref 12.0–46.0)
Lymphs Abs: 1.8 10*3/uL (ref 0.7–4.0)
MCHC: 34.1 g/dL (ref 30.0–36.0)
MCV: 92.2 fl (ref 78.0–100.0)
Monocytes Absolute: 0.7 10*3/uL (ref 0.1–1.0)
Monocytes Relative: 10.4 % (ref 3.0–12.0)
Neutro Abs: 4 10*3/uL (ref 1.4–7.7)
Neutrophils Relative %: 59.2 % (ref 43.0–77.0)
Platelets: 183 10*3/uL (ref 150.0–400.0)
RBC: 4.14 Mil/uL — ABNORMAL LOW (ref 4.22–5.81)
RDW: 15.7 % — ABNORMAL HIGH (ref 11.5–15.5)
WBC: 6.8 10*3/uL (ref 4.0–10.5)

## 2020-01-18 LAB — LIPID PANEL
Cholesterol: 94 mg/dL (ref 0–200)
HDL: 31.4 mg/dL — ABNORMAL LOW (ref >39.00)
LDL Cholesterol: 41 mg/dL (ref 0–99)
NonHDL: 62.96
Total CHOL/HDL Ratio: 3
Triglycerides: 108 mg/dL (ref 0.0–149.0)
VLDL: 21.6 mg/dL (ref 0.0–40.0)

## 2020-01-18 LAB — COMPREHENSIVE METABOLIC PANEL
ALT: 18 U/L (ref 0–53)
AST: 14 U/L (ref 0–37)
Albumin: 3.9 g/dL (ref 3.5–5.2)
Alkaline Phosphatase: 75 U/L (ref 39–117)
BUN: 20 mg/dL (ref 6–23)
CO2: 28 mEq/L (ref 19–32)
Calcium: 9.1 mg/dL (ref 8.4–10.5)
Chloride: 107 mEq/L (ref 96–112)
Creatinine, Ser: 1.15 mg/dL (ref 0.40–1.50)
GFR: 60.6 mL/min (ref >60.00)
Glucose, Bld: 138 mg/dL — ABNORMAL HIGH (ref 70–99)
Potassium: 4.4 mEq/L (ref 3.5–5.1)
Sodium: 142 mEq/L (ref 135–145)
Total Bilirubin: 0.6 mg/dL (ref 0.2–1.2)
Total Protein: 6.2 g/dL (ref 6.0–8.3)

## 2020-01-18 LAB — TSH: TSH: 5.58 u[IU]/mL — ABNORMAL HIGH (ref 0.35–4.50)

## 2020-01-18 LAB — VITAMIN B12: Vitamin B-12: 470 pg/mL (ref 211–911)

## 2020-01-18 LAB — PSA, MEDICARE: PSA: 1.93 ng/ml (ref 0.10–4.00)

## 2020-01-18 LAB — HEMOGLOBIN A1C: Hgb A1c MFr Bld: 8.9 % — ABNORMAL HIGH (ref 4.6–6.5)

## 2020-01-18 NOTE — Assessment & Plan Note (Signed)
Has procedure planned for squamous cell lesion on forehead today

## 2020-01-18 NOTE — Assessment & Plan Note (Signed)
bp in fair control at this time  BP Readings from Last 1 Encounters:  01/18/20 114/64   No changes needed Most recent labs reviewed  Disc lifstyle change with low sodium diet and exercise

## 2020-01-18 NOTE — Progress Notes (Signed)
Subjective:    Patient ID: Ryan Garcia, male    DOB: [birth year], 86 y.o.   MRN: [id number]  This visit occurred during the SARS-CoV-2 public health emergency.  Safety protocols were in place, including screening questions prior to the visit, additional usage of staff PPE, and extensive cleaning of exam room while observing appropriate contact time as indicated for disinfecting solutions.    HPI Pt presents for amw and health mt exam   I have personally reviewed the Medicare Annual Wellness questionnaire and have noted 1. The patient's medical and social history 2. Their use of alcohol, tobacco or illicit drugs 3. Their current medications and supplements 4. The patient's functional ability including ADL's, fall risks, home safety risks and hearing or visual             impairment. 5. Diet and physical activities 6. Evidence for depression or mood disorders  The patients weight, height, BMI have been recorded in the chart and visual acuity is per eye clinic.  I have made referrals, counseling and provided education to the patient based review of the above and I have provided the pt with a written personalized care plan for preventive services. Reviewed and updated provider list, see scanned forms.  See scanned forms.  Routine anticipatory guidance given to patient.  See health maintenance. Colon cancer screening- colonoscopy 2012 (polyps) - adenoma  Colonoscopy is now high risk given his heart issues (did have pos cologuard)- and GI recommended no further f/u or screening  Lately he has noticed stool is darker than usual / dark brown (not black at all)  No abd pain   Flu vaccine 12/20 Tetanus vaccine 1/13 Tdap Pneumovax-completed  covid status-immunized  Zoster vaccine-zostavax 5/14  Falls- none  Fractures-none  Exercise  Prostate cancer screening (pt has BPH)  Lab Results  Component Value Date   PSA 2.02 01/14/2019   PSA 2.25 09/07/2016   PSA 2.12 08/19/2015  no  changes in symptoms in the past year  Declines urology  Gets up to urinate once towards am    Advance directive-does not have one/ given materials to work on  [employer] he wants to remain full code  Cognitive function addressed- see scanned forms- and if abnormal then additional documentation follows.  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   ROS: See HPI.  Otherwise negative.    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 Dory Horn in Jan 2021 notable hearing loss-wears hearing aides   HTN  bp is stable today  No cp or palpitations or headaches or edema  No side effects to medicines  BP Readings from Last 3 Encounters:  01/18/20 114/64  11/18/19 132/68  10/19/19 126/80      Has CAD  with angioplasty nad h/o MI Uses cpap for OSA  DM2 Lab Results  Component Value Date   HGBA1C 7.1 (A) 10/19/2019   Taking glipizide and metformin and statin and ace and lantus  Due for labs today  Thinks glucose is up 170s in am and high 200s at night  This am 113   Eating some sweets - cookies/ jelly/ ice cream    H/o possible subclinical hypothyroidism but last tsh was normal Lab Results  Component Value Date   TSH 4.23 07/17/2019    Due for labs today   Hyperlipidemia Pravastatin and diet  Lab Results  Component Value Date   CHOL 126 09/15/2019   HDL 33 (L) 09/15/2019   LDLCALC 59  09/15/2019   LDLDIRECT 78.5 11/17/2012   TRIG 204 (H) 09/15/2019   CHOLHDL 3.8 09/15/2019   H/o low B12  Lab Results  Component Value Date   VITAMINB12 447 07/17/2019  takes B12 and co Q 10   Lab today   Has a skin procedure today for squamous cell lesion on face   Did not fast labs today-ate crackers and pb with pills   Patient Active Problem List   Diagnosis Date Noted  . Left elbow pain 11/18/2019  . Pain and swelling of right lower leg 06/23/2019  . Coronary artery disease involving native coronary artery of native heart with angina pectoris ([place]) 01/18/2019  . Medicare annual wellness visit, subsequent 01/16/2019  . Ecchymosis 10/22/2018  . Obesity (BMI 30-39.9) 07/09/2018  . HOH (hard of hearing) 03/31/2018  . Uncontrolled type 2 diabetes mellitus with hyperglycemia ([place]) 10/28/2017  . Subclinical hypothyroidism 10/28/2017  . Scoliosis   . Obstructive sleep apnea on CPAP   . Hx of skin cancer, basal cell   . Hyperlipidemia associated with type 2 diabetes mellitus ([place])   . GERD (gastroesophageal reflux disease)   . Chronic upper back pain   . Arthritis   . Allergy   . NSTEMI (non-ST elevated myocardial infarction) ([place]) 06/18/2017  . Actinic keratoses 04/29/2017  . Positive colorectal cancer screening using Cologuard test 10/31/2016  . BPH (benign prostatic hyperplasia) 09/04/2016  . B12 deficiency 11/22/2015  . Routine general medical examination at a health care facility 08/26/2015  . Abnormal nuclear cardiac imaging test   . Dyspnea on exertion 11/27/2013  . Encounter for examination of normal volunteer in research study 05/29/2013  . Prostate cancer screening 07/15/2012  . CAD S/P percutaneous coronary angioplasty 05/17/2011  . Nonspecific abnormal results of cardiovascular function study 04/30/2011  . Abnormal EKG 04/06/2011  . Right low back pain 01/13/2010  . Insulin dependent diabetes mellitus 10/04/2006  . ERECTILE DYSFUNCTION 10/04/2006  .  Essential hypertension 10/04/2006  . Allergic rhinitis 10/04/2006  . OSTEOARTHRITIS 10/04/2006  . Sleep apnea 10/04/2006  . EDEMA 10/04/2006  . SKIN CANCER, HX OF 10/04/2006   Past Medical History:  Diagnosis Date  . AC (acromioclavicular) joint bone spurs    lt ankle  . Allergy    allergic rhinitis  . Arthritis    OA  . BPH (benign prostatic hyperplasia)    microwave tx of prostate  . CAD (coronary artery disease)    a. s/p cath in 2012 showing 70-75% LAD stenosis, 75% D1 stenosis, 75% OM1, 60-70% RCA stenosis, and 80% PDA --> medical therapy pursued b. similar findings by cath in 2016; 06/2017 DES to distal RCA  . Chronic upper back pain   . Diabetes mellitus    type II x8 years  .  GERD (gastroesophageal reflux disease)   . HLD (hyperlipidemia)    10/12: TC 208, TG 213, HDL 36, LDL 129  . Hx of skin cancer, basal cell    many skin cancers removed/under constant treatment.  . Hypertension   . NSTEMI (non-ST elevated myocardial infarction) ([place]) 06/18/2017   DES to distal RCA  . Obstructive sleep apnea on CPAP   . Scoliosis    Past Surgical History:  Procedure Laterality Date  . BREAST SURGERY  2009   breast lump removed benign  . CARDIAC CATHETERIZATION N/A 03/15/2015   Procedure: Left Heart Cath and Coronary Angiography;  Surgeon: Belva Crome, MD;  Location: [facility];  Service: Cardiovascular;  Laterality: N/A;  . CORONARY STENT INTERVENTION N/A 06/18/2017   Procedure: CORONARY STENT INTERVENTION;  Surgeon: Jettie Booze, MD;  Location: [facility];  Service: Cardiovascular;  Laterality: N/A;  RCA  . LEFT HEART CATH AND CORONARY ANGIOGRAPHY N/A 06/18/2017   Procedure: LEFT HEART CATH AND CORONARY ANGIOGRAPHY;  Surgeon: Jettie Booze, MD;  Location: [facility];  Service: Cardiovascular;  Laterality: N/A;  . ULTRASOUND GUIDANCE FOR VASCULAR ACCESS  06/18/2017   Procedure: Ultrasound Guidance For Vascular Access;  Surgeon: Jettie Booze, MD;  Location: [facility];  Service: Cardiovascular;;  right radial, right femoral   Social History   Tobacco Use  . Smoking status: Never Smoker  . Smokeless tobacco: Never Used  Vaping Use  . Vaping Use: Never used  Substance Use Topics  . Alcohol use: No    Alcohol/week: 0.0 standard drinks  . Drug use: No   Family History  Problem Relation Age of Onset  . Heart attack Brother   . Colon cancer Neg Hx    Allergies  Allergen Reactions  . Loratadine     REACTION: not effective  . Simvastatin     REACTION: intolerant   Current Outpatient Medications on File Prior to Visit  Medication Sig Dispense Refill  . aspirin 81 MG tablet Take 81 mg by mouth daily.      . Blood Glucose Monitoring Suppl (TRUE METRIX AIR GLUCOSE METER) W/DEVICE KIT 1 kit by Other route 2 (two) times daily. Check blood sugar twice daily and as directed. Dx E11.65 1 kit 0  . cetirizine (ZYRTEC) 10 MG tablet TAKE 1 TABLET EVERY DAY 90 tablet 1  . Coenzyme Q10 (CO Q 10 PO) Take 200 mg by mouth daily.    . Cyanocobalamin (VITAMIN B12 PO) Take 1,000 mg by mouth daily.     . diclofenac Sodium (VOLTAREN) 1 % GEL Apply 2 g topically 4 (four) times daily. To elbow/affected area 100 g 3  . DROPLET PEN NEEDLES 31G X 6 MM MISC USE ONE TIME DAILY  AT  BEDTIME  WITH  LANTUS 100 each 0  . fluticasone (FLONASE) 50 MCG/ACT nasal spray Place 1 spray into both nostrils daily.    Marland Kitchen glipiZIDE (GLUCOTROL) 10 MG tablet TAKE 1 TABLET TWICE DAILY WITH MEALS 180 tablet 1  . hydrochlorothiazide (HYDRODIURIL) 12.5 MG tablet TAKE 1 TABLET BY MOUTH DAILY. 90 tablet 3  . Insulin Glargine (LANTUS SOLOSTAR) 100 UNIT/ML Solostar Pen INJECT  50 UNITS SUBCUTANEOUSLY AT BEDTIME 50 mL 3  . isosorbide mononitrate (IMDUR) 30 MG 24 hr tablet Take 1 tablet (30 mg total) by mouth daily. 90 tablet 3  . lisinopril (ZESTRIL) 20 MG tablet TAKE 1 TABLET EVERY DAY 90 tablet 2  . metFORMIN (GLUCOPHAGE) 1000  MG tablet TAKE 1 TABLET TWICE  DAILY 180 tablet 1  . metoprolol tartrate (LOPRESSOR) 25 MG tablet Take 0.5 tablets (12.5 mg total) by mouth 2 (two) times daily. 90 tablet 3  . nitroGLYCERIN (NITROSTAT) 0.4 MG SL tablet Place 1 tablet (0.4 mg total) under the tongue every 5 (five) minutes as needed for chest pain. 25 tablet 3  . pravastatin (PRAVACHOL) 80 MG tablet Take 80 mg by mouth daily.    . TRUE METRIX BLOOD GLUCOSE TEST test strip CHECK BLOOD SUGAR TWICE DAILY 200 strip 2  . TRUEPLUS LANCETS 30G MISC Check blood sugar twice daily and as directed. Dx E11.65 200 each 1   No current facility-administered medications on file prior to visit.    Review of Systems  Constitutional: Positive for fatigue. Negative for activity change, appetite change, fever and unexpected weight change.  HENT: Negative for congestion, rhinorrhea, sore throat and trouble swallowing.   Eyes: Negative for pain, redness, itching and visual disturbance.  Respiratory: Positive for shortness of breath. Negative for cough, chest tightness and wheezing.        Baseline sob on exertion   Cardiovascular: Positive for leg swelling. Negative for chest pain and palpitations.       Ankle edema -worse in summer  Gastrointestinal: Negative for abdominal pain, blood in stool, constipation, diarrhea and nausea.  Endocrine: Negative for cold intolerance, heat intolerance, polydipsia and polyuria.  Genitourinary: Negative for difficulty urinating, dysuria, frequency and urgency.  Musculoskeletal: Positive for arthralgias. Negative for joint swelling and myalgias.  Skin: Negative for pallor and rash.  Neurological: Negative for dizziness, tremors, weakness, numbness and headaches.  Hematological: Negative for adenopathy. Does not bruise/bleed easily.  Psychiatric/Behavioral: Negative for decreased concentration and dysphoric mood. The patient is not nervous/anxious.        Objective:   Physical Exam Constitutional:      General: He is not in acute  distress.    Appearance: Normal appearance. He is well-developed. He is obese. He is not ill-appearing or diaphoretic.  HENT:     Head: Normocephalic and atraumatic.     Right Ear: Tympanic membrane, ear canal and external ear normal.     Left Ear: Tympanic membrane, ear canal and external ear normal.     Nose: Nose normal. No congestion.     Mouth/Throat:     Mouth: Mucous membranes are moist.     Pharynx: Oropharynx is clear. No posterior oropharyngeal erythema.  Eyes:     General: No scleral icterus.       Right eye: No discharge.        Left eye: No discharge.     Conjunctiva/sclera: Conjunctivae normal.     Pupils: Pupils are equal, round, and reactive to light.  Neck:     Thyroid: No thyromegaly.     Vascular: No carotid bruit or JVD.  Cardiovascular:     Rate and Rhythm: Normal rate and regular rhythm.     Pulses: Normal pulses.     Heart sounds: Normal heart sounds. No gallop.   Pulmonary:     Effort: Pulmonary effort is normal. No respiratory distress.     Breath sounds: Normal breath sounds. No wheezing or rales.     Comments: Good air exch Chest:     Chest wall: No tenderness.  Abdominal:     General: Bowel sounds are normal. There is no distension or abdominal bruit.     Palpations: Abdomen is soft. There is no mass.  Tenderness: There is no abdominal tenderness.     Hernia: No hernia is present.  Musculoskeletal:        General: No tenderness.     Cervical back: Normal range of motion and neck supple. No rigidity. No muscular tenderness.     Right lower leg: Edema present.     Left lower leg: Edema present.     Comments: No kyphosis   Trace pedal edema bilat   Lymphadenopathy:     Cervical: No cervical adenopathy.  Skin:    General: Skin is warm and dry.     Coloration: Skin is not pale.     Findings: No erythema or rash.     Comments: Solar aging/ lentigines and aks  Area on forehead for derm procedure today    Neurological:     Mental Status: He  is alert.     Cranial Nerves: No cranial nerve deficit.     Motor: No abnormal muscle tone.     Coordination: Coordination normal.     Gait: Gait normal.     Deep Tendon Reflexes: Reflexes are normal and symmetric. Reflexes normal.  Psychiatric:        Mood and Affect: Mood normal.        Cognition and Memory: Cognition and memory normal.           Assessment & Plan:   Problem List Items Addressed This Visit      Cardiovascular and Mediastinum   Essential hypertension    bp in fair control at this time  BP Readings from Last 1 Encounters:  01/18/20 114/64   No changes needed Most recent labs reviewed  Disc lifstyle change with low sodium diet and exercise        Relevant Orders   CBC with Differential/Platelet   Comprehensive metabolic panel   Lipid panel   TSH   Coronary artery disease involving native coronary artery of native heart with angina pectoris ([place])    Continues cardiology f/u  More ankle edema in the summer  Is always sob on exertion-no change in that        Respiratory   Obstructive sleep apnea on CPAP    Uses daily  Needs more naps as he gets older        Endocrine   Hyperlipidemia associated with type 2 diabetes mellitus ([place])    Lab today  Disc goals for lipids and reasons to control them Rev last labs with pt Rev low sat fat diet in detail On statin  Has dm and CAD      Relevant Orders   Lipid panel   Uncontrolled type 2 diabetes mellitus with hyperglycemia ([facility])    Glucose readings at home are high due to eating sweets Says he can cut back  A1C today  Taking lantus and metformin and glipizide  On ace and statin  Good foot care utd eye exam       Relevant Orders   Hemoglobin A1c   Subclinical hypothyroidism    Last tsh normal A little more fatigue  TSH today      Relevant Orders   TSH     Nervous and Auditory   HOH (hard of hearing)    Wearing hearing aides- helpful        Musculoskeletal and Integument    Actinic keratoses    Has procedure planned for squamous cell lesion on forehead today        Genitourinary   BPH (benign prostatic  hyperplasia)    psa today  No change in voiding symptoms       Relevant Orders   PSA, Medicare     Other   Prostate cancer screening   Routine general medical examination at a health care facility    Reviewed health habits including diet and exercise and skin cancer prevention Reviewed appropriate screening tests for age  Also reviewed health mt list, fam hx and immunization status , as well as social and family history   See HPI Labs ordered Immunized for covid  No falls or fx Given materials to work on [employer] directive  No cognitive concerns No change in care team Wearing hearing aides and also utd eye /vision exam w/o complaints         B12 deficiency    Taking oral B12 now  Level today  Some fatigue      Relevant Orders   Vitamin B12   Obesity (BMI 30-39.9)    Limited exercise due to CAD Diet is not optimal- eating sweets   Urged to stay as active as he can be Also to get most carbs from produce instead of sweets and other processed foods       Medicare annual wellness visit, subsequent - Primary    Reviewed health habits including diet and exercise and skin cancer prevention Reviewed appropriate screening tests for age  Also reviewed health mt list, fam hx and immunization status , as well as social and family history   See HPI Labs ordered Immunized for covid  No falls or fx Given materials to work on [employer] directive  No cognitive concerns No change in care team Wearing hearing aides and also utd eye /vision exam w/o complaints

## 2020-01-18 NOTE — Assessment & Plan Note (Signed)
Uses daily  Needs more naps as he gets older

## 2020-01-18 NOTE — Assessment & Plan Note (Signed)
Wearing hearing aides- helpful

## 2020-01-18 NOTE — Assessment & Plan Note (Signed)
Last tsh normal A little more fatigue  TSH today

## 2020-01-18 NOTE — Assessment & Plan Note (Signed)
Taking oral B12 now  Level today  Some fatigue

## 2020-01-18 NOTE — Assessment & Plan Note (Signed)
Reviewed health habits including diet and exercise and skin cancer prevention Reviewed appropriate screening tests for age  Also reviewed health mt list, fam hx and immunization status , as well as social and family history   See HPI Labs ordered Immunized for covid  No falls or fx Given materials to work on [employer] directive  No cognitive concerns No change in care team Wearing hearing aides and also utd eye /vision exam w/o complaints

## 2020-01-18 NOTE — Assessment & Plan Note (Signed)
Continues cardiology f/u  More ankle edema in the summer  Is always sob on exertion-no change in that

## 2020-01-18 NOTE — Assessment & Plan Note (Signed)
psa today  No change in voiding symptoms

## 2020-01-18 NOTE — Patient Instructions (Signed)
Take care of yourself  Labs today  Please work on the advance directive   We will see how A1c looks  Do work on cutting out sweets if you can   Good luck with skin procedure today

## 2020-01-18 NOTE — Assessment & Plan Note (Signed)
Limited exercise due to CAD Diet is not optimal- eating sweets   Urged to stay as active as he can be Also to get most carbs from produce instead of sweets and other processed foods

## 2020-01-18 NOTE — Assessment & Plan Note (Signed)
Glucose readings at home are high due to eating sweets Says he can cut back  A1C today  Taking lantus and metformin and glipizide  On ace and statin  Good foot care utd eye exam

## 2020-01-18 NOTE — Assessment & Plan Note (Signed)
Lab today  Disc goals for lipids and reasons to control them Rev last labs with pt Rev low sat fat diet in detail On statin  Has dm and CAD

## 2020-01-20 ENCOUNTER — Telehealth: Payer: Self-pay | Admitting: *Deleted

## 2020-01-20 MED ORDER — LANTUS SOLOSTAR 100 UNIT/ML ~~LOC~~ SOPN: PEN_INJECTOR | SUBCUTANEOUS | 3 refills | Status: DC

## 2020-01-20 NOTE — Telephone Encounter (Signed)
-----   Message from Abner Greenspan, MD sent at 01/18/2020  9:01 PM EDT ----- A1c is way up to 8.9 from 7.1 last time  Please stop sweets if possible  Please increase lantus from 50 to 60 units every evening- please change in med list and send to pharmacy if needed  Keep glucose log  F/u with me in 3 mo  Other labs stable  TSH is borderline as usual-we will continue to watch

## 2020-01-20 NOTE — Telephone Encounter (Signed)
Rx sent to pharmacy and f/u appt scheduled

## 2020-02-09 ENCOUNTER — Other Ambulatory Visit: Payer: Self-pay | Admitting: Family Medicine

## 2020-03-09 ENCOUNTER — Other Ambulatory Visit: Payer: Self-pay | Admitting: Cardiology

## 2020-03-09 ENCOUNTER — Other Ambulatory Visit: Payer: Self-pay | Admitting: Family Medicine

## 2020-04-11 ENCOUNTER — Encounter (HOSPITAL_COMMUNITY): Payer: Self-pay | Admitting: *Deleted

## 2020-04-11 ENCOUNTER — Observation Stay (HOSPITAL_BASED_OUTPATIENT_CLINIC_OR_DEPARTMENT_OTHER)
Admission: EM | Admit: 2020-04-11 | Discharge: 2020-04-12 | Disposition: A | Discharge status: Home / Self Care | Payer: Medicare HMO | Source: Home / Self Care | Attending: Emergency Medicine | Admitting: Emergency Medicine

## 2020-04-11 ENCOUNTER — Emergency Department (HOSPITAL_COMMUNITY): Payer: Medicare HMO

## 2020-04-11 ENCOUNTER — Other Ambulatory Visit: Payer: Self-pay

## 2020-04-11 DIAGNOSIS — I25119 Atherosclerotic heart disease of native coronary artery with unspecified angina pectoris: Secondary | ICD-10-CM | POA: Diagnosis present

## 2020-04-11 DIAGNOSIS — Z794 Long term (current) use of insulin: Secondary | ICD-10-CM | POA: Insufficient documentation

## 2020-04-11 DIAGNOSIS — Z79899 Other long term (current) drug therapy: Secondary | ICD-10-CM | POA: Insufficient documentation

## 2020-04-11 DIAGNOSIS — I200 Unstable angina: Secondary | ICD-10-CM

## 2020-04-11 DIAGNOSIS — R072 Precordial pain: Secondary | ICD-10-CM

## 2020-04-11 DIAGNOSIS — Z7982 Long term (current) use of aspirin: Secondary | ICD-10-CM | POA: Insufficient documentation

## 2020-04-11 DIAGNOSIS — I2510 Atherosclerotic heart disease of native coronary artery without angina pectoris: Secondary | ICD-10-CM | POA: Diagnosis present

## 2020-04-11 DIAGNOSIS — E1169 Type 2 diabetes mellitus with other specified complication: Secondary | ICD-10-CM | POA: Diagnosis present

## 2020-04-11 DIAGNOSIS — Z85828 Personal history of other malignant neoplasm of skin: Secondary | ICD-10-CM | POA: Insufficient documentation

## 2020-04-11 DIAGNOSIS — R52 Pain, unspecified: Secondary | ICD-10-CM | POA: Diagnosis not present

## 2020-04-11 DIAGNOSIS — I25110 Atherosclerotic heart disease of native coronary artery with unstable angina pectoris: Secondary | ICD-10-CM | POA: Insufficient documentation

## 2020-04-11 DIAGNOSIS — Z7984 Long term (current) use of oral hypoglycemic drugs: Secondary | ICD-10-CM | POA: Insufficient documentation

## 2020-04-11 DIAGNOSIS — R079 Chest pain, unspecified: Secondary | ICD-10-CM | POA: Diagnosis present

## 2020-04-11 DIAGNOSIS — K8030 Calculus of bile duct with cholangitis, unspecified, without obstruction: Secondary | ICD-10-CM | POA: Diagnosis not present

## 2020-04-11 DIAGNOSIS — E1165 Type 2 diabetes mellitus with hyperglycemia: Secondary | ICD-10-CM | POA: Diagnosis present

## 2020-04-11 DIAGNOSIS — Z20822 Contact with and (suspected) exposure to covid-19: Secondary | ICD-10-CM | POA: Insufficient documentation

## 2020-04-11 DIAGNOSIS — R0602 Shortness of breath: Secondary | ICD-10-CM | POA: Diagnosis not present

## 2020-04-11 DIAGNOSIS — I491 Atrial premature depolarization: Secondary | ICD-10-CM | POA: Diagnosis not present

## 2020-04-11 DIAGNOSIS — I10 Essential (primary) hypertension: Secondary | ICD-10-CM | POA: Insufficient documentation

## 2020-04-11 DIAGNOSIS — R0789 Other chest pain: Secondary | ICD-10-CM | POA: Diagnosis not present

## 2020-04-11 DIAGNOSIS — R61 Generalized hyperhidrosis: Secondary | ICD-10-CM

## 2020-04-11 DIAGNOSIS — K8032 Calculus of bile duct with acute cholangitis without obstruction: Secondary | ICD-10-CM | POA: Diagnosis not present

## 2020-04-11 LAB — CBC WITH DIFFERENTIAL/PLATELET
Abs Immature Granulocytes: 0.05 10*3/uL (ref 0.00–0.07)
Basophils Absolute: 0 10*3/uL (ref 0.0–0.1)
Basophils Relative: 0 %
Eosinophils Absolute: 0.2 10*3/uL (ref 0.0–0.5)
Eosinophils Relative: 2 %
HCT: 38.8 % — ABNORMAL LOW (ref 39.0–52.0)
Hemoglobin: 12.2 g/dL — ABNORMAL LOW (ref 13.0–17.0)
Immature Granulocytes: 1 %
Lymphocytes Relative: 16 %
Lymphs Abs: 1.5 10*3/uL (ref 0.7–4.0)
MCH: 30.2 pg (ref 26.0–34.0)
MCHC: 31.4 g/dL (ref 30.0–36.0)
MCV: 96 fL (ref 80.0–100.0)
Monocytes Absolute: 1.3 10*3/uL — ABNORMAL HIGH (ref 0.1–1.0)
Monocytes Relative: 13 %
Neutro Abs: 6.8 10*3/uL (ref 1.7–7.7)
Neutrophils Relative %: 68 %
Platelets: 165 10*3/uL (ref 150–400)
RBC: 4.04 MIL/uL — ABNORMAL LOW (ref 4.22–5.81)
RDW: 15.2 % (ref 11.5–15.5)
WBC: 9.9 10*3/uL (ref 4.0–10.5)
nRBC: 0 % (ref 0.0–0.2)

## 2020-04-11 LAB — RESPIRATORY PANEL BY RT PCR (FLU A&B, COVID)
Influenza A by PCR: NEGATIVE
Influenza B by PCR: NEGATIVE
SARS Coronavirus 2 by RT PCR: NEGATIVE

## 2020-04-11 LAB — HEMOGLOBIN A1C
Hgb A1c MFr Bld: 7 % — ABNORMAL HIGH (ref 4.8–5.6)
Mean Plasma Glucose: 154.2 mg/dL

## 2020-04-11 LAB — LIPASE, BLOOD: Lipase: 23 U/L (ref 11–51)

## 2020-04-11 LAB — CBG MONITORING, ED: Glucose-Capillary: 188 mg/dL — ABNORMAL HIGH (ref 70–99)

## 2020-04-11 LAB — TROPONIN I (HIGH SENSITIVITY)
Troponin I (High Sensitivity): 8 ng/L (ref <18)
Troponin I (High Sensitivity): 7 ng/L (ref <18)

## 2020-04-11 LAB — COMPREHENSIVE METABOLIC PANEL
ALT: 46 U/L — ABNORMAL HIGH (ref 0–44)
AST: 87 U/L — ABNORMAL HIGH (ref 15–41)
Albumin: 3.3 g/dL — ABNORMAL LOW (ref 3.5–5.0)
Alkaline Phosphatase: 84 U/L (ref 38–126)
Anion gap: 11 (ref 5–15)
BUN: 28 mg/dL — ABNORMAL HIGH (ref 8–23)
CO2: 22 mmol/L (ref 22–32)
Calcium: 8.7 mg/dL — ABNORMAL LOW (ref 8.9–10.3)
Chloride: 107 mmol/L (ref 98–111)
Creatinine, Ser: 1.59 mg/dL — ABNORMAL HIGH (ref 0.61–1.24)
GFR, Estimated: 39 mL/min — ABNORMAL LOW (ref >60)
Glucose, Bld: 210 mg/dL — ABNORMAL HIGH (ref 70–99)
Potassium: 4.4 mmol/L (ref 3.5–5.1)
Sodium: 140 mmol/L (ref 135–145)
Total Bilirubin: 1.5 mg/dL — ABNORMAL HIGH (ref 0.3–1.2)
Total Protein: 6.1 g/dL — ABNORMAL LOW (ref 6.5–8.1)

## 2020-04-11 LAB — CREATININE, SERUM
Creatinine, Ser: 1.51 mg/dL — ABNORMAL HIGH (ref 0.61–1.24)
GFR, Estimated: 42 mL/min — ABNORMAL LOW (ref >60)

## 2020-04-11 IMAGING — DX DG CHEST 1V PORT
1 series · 1 of 1 positions shown · non-contrast
Comparison: [DATE]

CLINICAL DATA: Chest pain short of breath

EXAM:
PORTABLE CHEST 1 VIEW

[chest ap]
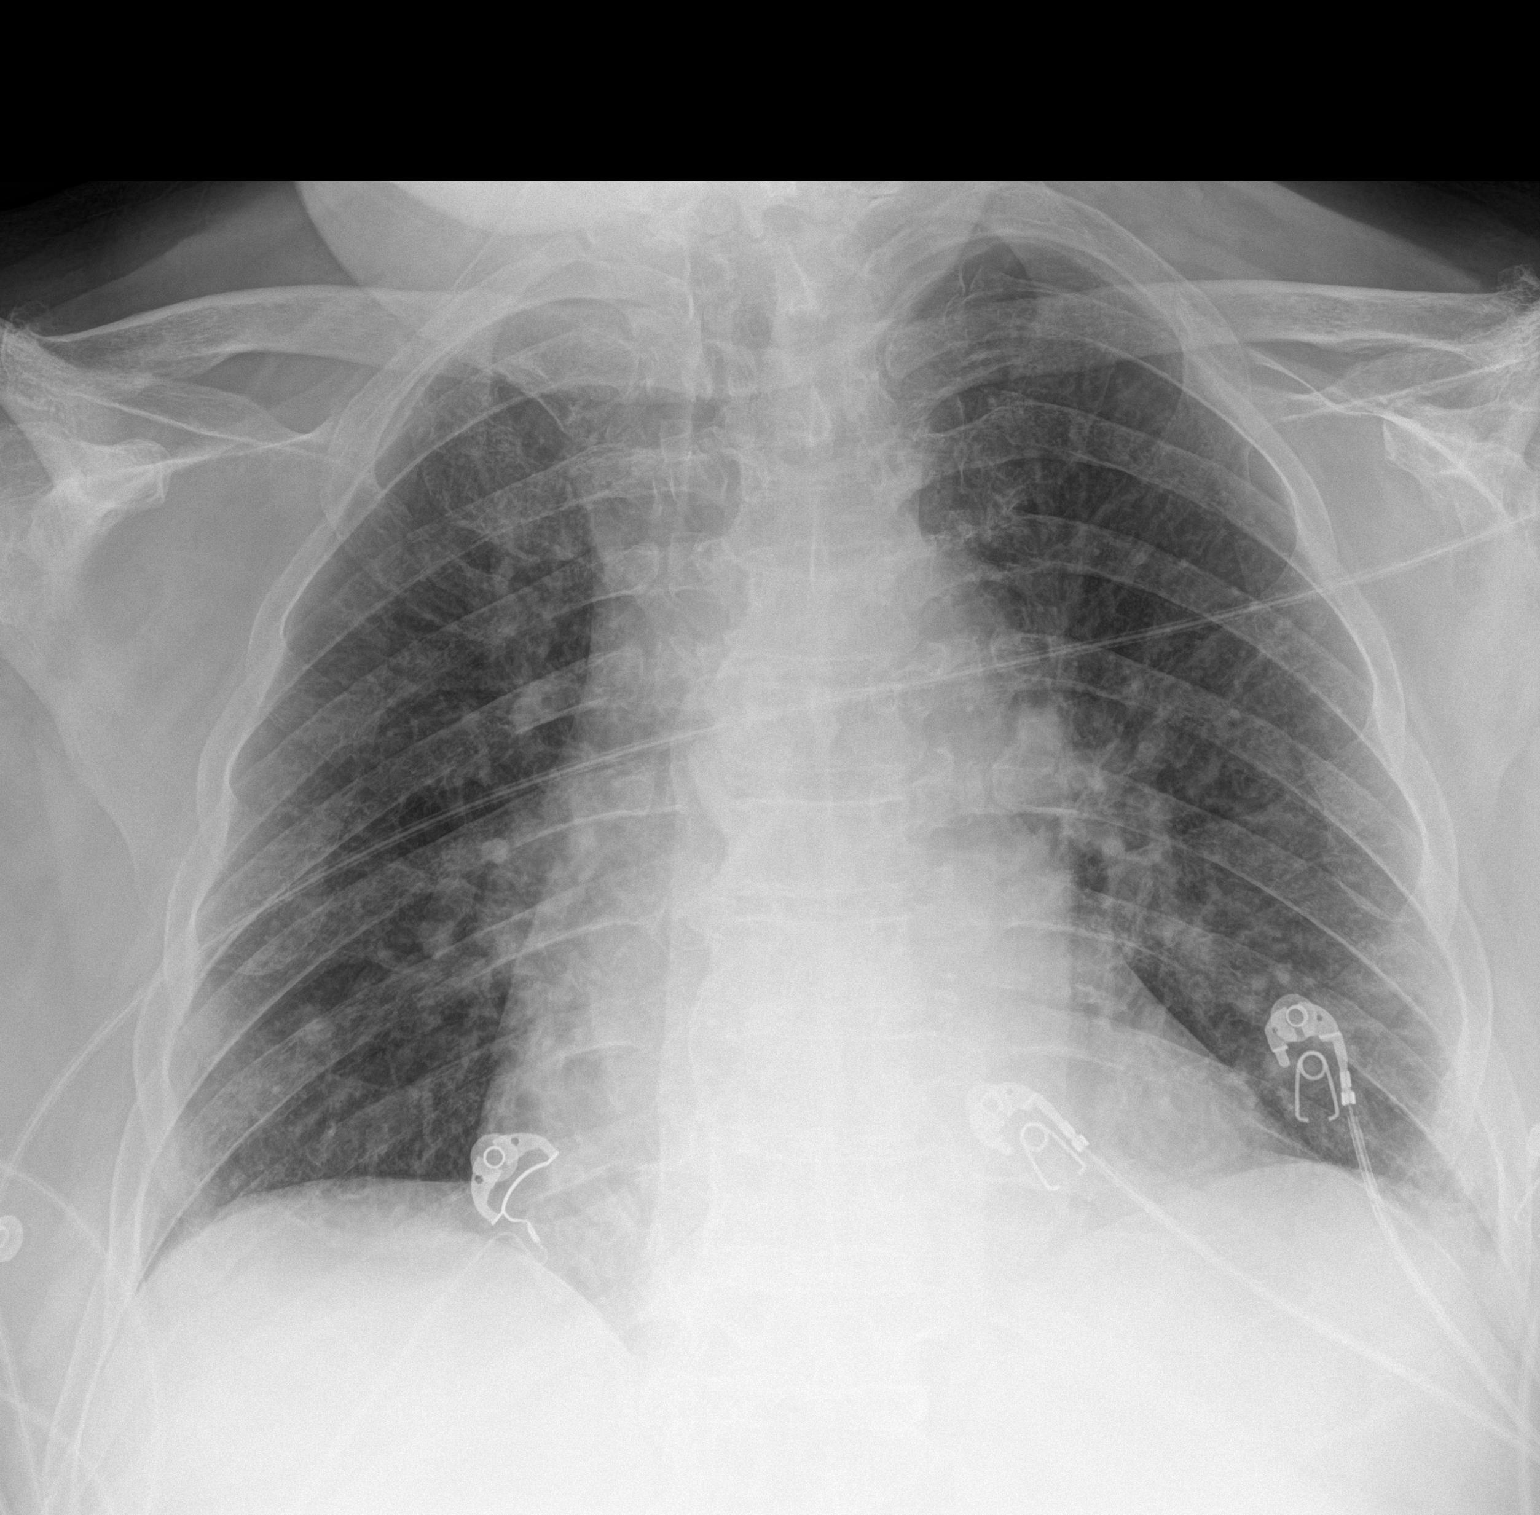

[1 of 1 positions shown; findings below may reference images not displayed]

FINDINGS: Heart size upper normal.  Normal vascularity.

Mild airspace disease in the left costophrenic angle is new.
Possible pneumonia or pulmonary infarct. No significant effusion.
Right lung clear.
IMPRESSION: Small area of airspace disease in the left lateral lung base.
Possible pneumonia or pulmonary infarct.

## 2020-04-11 MED ORDER — ASPIRIN EC 81 MG PO TBEC
81.0000 mg | DELAYED_RELEASE_TABLET | Freq: Every day | ORAL | Status: DC
  Administered 2020-04-12: 81 mg via ORAL
  Filled 2020-04-11: qty 1

## 2020-04-11 MED ORDER — ASPIRIN 300 MG RE SUPP: 300.0000 mg | RECTAL | Status: DC

## 2020-04-11 MED ORDER — SODIUM CHLORIDE 0.9% FLUSH: 3.0000 mL | INTRAVENOUS | Status: DC | PRN

## 2020-04-11 MED ORDER — PRAVASTATIN SODIUM 40 MG PO TABS
80.0000 mg | ORAL_TABLET | Freq: Every day | ORAL | Status: DC
  Administered 2020-04-11 – 2020-04-12 (×2): 80 mg via ORAL
  Filled 2020-04-11 (×2): qty 2

## 2020-04-11 MED ORDER — HEPARIN SODIUM (PORCINE) 5000 UNIT/ML IJ SOLN
5000.0000 [IU] | Freq: Three times a day (TID) | INTRAMUSCULAR | Status: DC
  Administered 2020-04-11 – 2020-04-12 (×2): 5000 [IU] via SUBCUTANEOUS
  Filled 2020-04-11 (×3): qty 1

## 2020-04-11 MED ORDER — SODIUM CHLORIDE 0.9 % IV SOLN: 250.0000 mL | INTRAVENOUS | Status: DC | PRN

## 2020-04-11 MED ORDER — ISOSORBIDE MONONITRATE ER 30 MG PO TB24
30.0000 mg | ORAL_TABLET | Freq: Every day | ORAL | Status: DC
  Administered 2020-04-11 – 2020-04-12 (×2): 30 mg via ORAL
  Filled 2020-04-11 (×2): qty 1

## 2020-04-11 MED ORDER — METOPROLOL TARTRATE 25 MG PO TABS
12.5000 mg | ORAL_TABLET | Freq: Two times a day (BID) | ORAL | Status: DC
  Administered 2020-04-11 – 2020-04-12 (×2): 12.5 mg via ORAL
  Filled 2020-04-11 (×2): qty 1

## 2020-04-11 MED ORDER — ASPIRIN 81 MG PO TABS: 81.0000 mg | ORAL_TABLET | Freq: Every day | ORAL | Status: DC

## 2020-04-11 MED ORDER — INSULIN ASPART 100 UNIT/ML ~~LOC~~ SOLN: 0.0000 [IU] | Freq: Every day | SUBCUTANEOUS | Status: DC

## 2020-04-11 MED ORDER — ASPIRIN 81 MG PO CHEW: 324.0000 mg | CHEWABLE_TABLET | ORAL | Status: DC

## 2020-04-11 MED ORDER — ALPRAZOLAM 0.25 MG PO TABS: 0.2500 mg | ORAL_TABLET | Freq: Two times a day (BID) | ORAL | Status: DC | PRN

## 2020-04-11 MED ORDER — ACETAMINOPHEN 325 MG PO TABS: 650.0000 mg | ORAL_TABLET | ORAL | Status: DC | PRN

## 2020-04-11 MED ORDER — ZOLPIDEM TARTRATE 5 MG PO TABS: 5.0000 mg | ORAL_TABLET | Freq: Every evening | ORAL | Status: DC | PRN

## 2020-04-11 MED ORDER — INSULIN ASPART 100 UNIT/ML ~~LOC~~ SOLN
0.0000 [IU] | Freq: Three times a day (TID) | SUBCUTANEOUS | Status: DC
  Administered 2020-04-12: 3 [IU] via SUBCUTANEOUS

## 2020-04-11 MED ORDER — SODIUM CHLORIDE 0.9% FLUSH
3.0000 mL | Freq: Two times a day (BID) | INTRAVENOUS | Status: DC
  Administered 2020-04-12: 3 mL via INTRAVENOUS

## 2020-04-11 MED ORDER — ONDANSETRON HCL 4 MG/2ML IJ SOLN: 4.0000 mg | Freq: Four times a day (QID) | INTRAMUSCULAR | Status: DC | PRN

## 2020-04-11 MED ORDER — NITROGLYCERIN 0.4 MG SL SUBL: 0.4000 mg | SUBLINGUAL_TABLET | SUBLINGUAL | Status: DC | PRN

## 2020-04-11 NOTE — H&P (Addendum)
Cardiology Admission History and Physical:   Patient ID: Ryan Garcia MRN: [id number]; DOB: [birth year]   Admission date: 04/11/2020  Primary Care Provider: Abner Greenspan, MD [facility] Cardiologist: Kirk Ruths, MD  [place] Electrophysiologist:  None   Chief Complaint:  Chest pain  Patient Profile:   Ryan Garcia is a 86 y.o. male with pmh of CAD with 3V disease treated medically by heart cath in 2012 and 2016 (wanted to avoid CABG), NSTEMI 06/2017 with cath showing new RCA lesion treated with DES, OSA on COAO HTN, DM2, chronic DOE, occasional pedal edema who is being seen for chest pain.   History of Present Illness:   Ryan Garcia is followed by Dr. Stanford Breed. CAD history of above.NSTEMI in 2018 with DES to RCA. Echo at that time showed  Normal LV function and mild LAE. He was last seen 09/08/19 and was symptomatically stable from a cardiac perspective.   He presents to the ED for chest pain. [place] for the last couple days patient felt a some fatigue. This morning he woke up and at breakfast around 10 AM. He went to the barn to do some work. He started having chest discomfort and went inside and sat down. The chest pain for worse. It was substernal and non-radiating. It was pressure like in nature. It got as high as 9/10. He took Sl nitro x 1. He has associated sob and diaphoresis. No nausea or vomiting. EMS was called and he was instructed to take 4 baby aspirin. EMS took him to ER for further evaluation. Pain lasted about an hour total. Last week he had 3 days of diarrhea followed by 3 days of lower abdominal discomfort. Today however did not notice this. Denies recent fever, chills, lightheadedness, dizziness, lower leg edema, orthopnea, palpitations.   In the ED BP 120/53, pulse 75, afebrile, RR 20, 96% O2. Labs pending. EKG shows no new ischemic changes. CXR showed small area of airspace disease in the left lateral lung base with  possible PNA or pulmonary infarct.    Past Medical History:  Diagnosis Date  . AC (acromioclavicular) joint bone spurs    lt ankle  . Allergy    allergic rhinitis  . Arthritis    OA  . BPH (benign prostatic hyperplasia)    microwave tx of prostate  . CAD (coronary artery disease)    a. s/p cath in 2012 showing 70-75% LAD stenosis, 75% D1 stenosis, 75% OM1, 60-70% RCA stenosis, and 80% PDA --> medical therapy pursued b. similar findings by cath in 2016; 06/2017 DES to distal RCA  . Chronic upper back pain   . Diabetes mellitus    type II x8 years  . GERD (gastroesophageal reflux disease)   . HLD (hyperlipidemia)    10/12: TC 208, TG 213, HDL 36, LDL 129  . Hx of skin cancer, basal cell    many skin cancers removed/under constant treatment.  . Hypertension   . NSTEMI (non-ST elevated myocardial infarction) ([place]) 06/18/2017   DES to distal RCA  . Obstructive sleep apnea on CPAP   . Scoliosis     Past Surgical History:  Procedure Laterality Date  . BREAST SURGERY  2009   breast lump removed benign  . CARDIAC CATHETERIZATION N/A 03/15/2015   Procedure: Left Heart Cath and Coronary Angiography;  Surgeon: Belva Crome, MD;  Location: [place] [facility];  Service: Cardiovascular;  Laterality: N/A;  . CORONARY STENT  INTERVENTION N/A 06/18/2017   Procedure: CORONARY STENT INTERVENTION;  Surgeon: Jettie Booze, MD;  Location: [place] [facility];  Service: Cardiovascular;  Laterality: N/A;  RCA  . LEFT HEART CATH AND CORONARY ANGIOGRAPHY N/A 06/18/2017   Procedure: LEFT HEART CATH AND CORONARY ANGIOGRAPHY;  Surgeon: Jettie Booze, MD;  Location: [place] [facility];  Service: Cardiovascular;  Laterality: N/A;  . ULTRASOUND GUIDANCE FOR VASCULAR ACCESS  06/18/2017   Procedure: Ultrasound Guidance For Vascular Access;  Surgeon: Jettie Booze, MD;  Location: [place] [facility];  Service: Cardiovascular;;  right radial, right femoral     Medications Prior to  Admission: Prior to Admission medications   Medication Sig Start Date End Date Taking? Authorizing Provider  aspirin 81 MG tablet Take 81 mg by mouth daily.      [provider]  Blood Glucose Monitoring Suppl (TRUE METRIX AIR GLUCOSE METER) W/DEVICE KIT 1 kit by Other route 2 (two) times daily. Check blood sugar twice daily and as directed. Dx E11.65 03/17/15   Tower, Wynelle Fanny, MD  cetirizine (ZYRTEC) 10 MG tablet TAKE 1 TABLET EVERY DAY 11/14/16   Tower, Wynelle Fanny, MD  Coenzyme Q10 (CO Q 10 PO) Take 200 mg by mouth daily.    [provider]  Cyanocobalamin (VITAMIN B12 PO) Take 1,000 mg by mouth daily.     [provider]  diclofenac Sodium (VOLTAREN) 1 % GEL Apply 2 g topically 4 (four) times daily. To elbow/affected area 11/18/19   Tower, Wynelle Fanny, MD  DROPLET PEN NEEDLES 31G X 6 MM MISC USE ONE TIME DAILY  AT  BEDTIME  WITH  LANTUS 02/10/20   Tower, Wynelle Fanny, MD  fluticasone (FLONASE) 50 MCG/ACT nasal spray Place 1 spray into both nostrils daily.    [provider]  glipiZIDE (GLUCOTROL) 10 MG tablet TAKE 1 TABLET TWICE DAILY WITH MEALS 12/15/19   Tower, Coolin A, MD  hydrochlorothiazide (HYDRODIURIL) 12.5 MG tablet TAKE 1 TABLET EVERY DAY 03/09/20   Lelon Perla, MD  insulin glargine (LANTUS SOLOSTAR) 100 UNIT/ML Solostar Pen INJECT 60 UNITS SUBCUTANEOUSLY AT BEDTIME 01/20/20   Tower, Wynelle Fanny, MD  isosorbide mononitrate (IMDUR) 30 MG 24 hr tablet Take 1 tablet (30 mg total) by mouth daily. 11/26/19   Lelon Perla, MD  lisinopril (ZESTRIL) 20 MG tablet TAKE 1 TABLET EVERY DAY 11/17/19   Lelon Perla, MD  metFORMIN (GLUCOPHAGE) 1000 MG tablet TAKE 1 TABLET TWICE DAILY 03/10/20   Tower, Wynelle Fanny, MD  metoprolol tartrate (LOPRESSOR) 25 MG tablet Take 0.5 tablets (12.5 mg total) by mouth 2 (two) times daily. 11/05/19   Lelon Perla, MD  nitroGLYCERIN (NITROSTAT) 0.4 MG SL tablet Place 1 tablet (0.4 mg total) under the tongue every 5 (five) minutes as needed for  chest pain. 05/11/19   Duke, Tami Lin, PA  pravastatin (PRAVACHOL) 80 MG tablet Take 80 mg by mouth daily.    [provider]  TRUE METRIX BLOOD GLUCOSE TEST test strip CHECK BLOOD SUGAR TWICE DAILY 12/15/19   Tower, Wynelle Fanny, MD  TRUEPLUS LANCETS 30G MISC Check blood sugar twice daily and as directed. Dx E11.65 03/17/15   Abner Greenspan, MD     Allergies:    Allergies  Allergen Reactions  . Loratadine     REACTION: not effective  . Simvastatin     REACTION: intolerant    Social History:   Social History   Socioeconomic History  . Marital status:  Married    Spouse name: Not on file  . Number of children: Not on file  . Years of education: Not on file  . Highest education level: Not on file  Occupational History  . Not on file  Tobacco Use  . Smoking status: Never Smoker  . Smokeless tobacco: Never Used  Vaping Use  . Vaping Use: Never used  Substance and Sexual Activity  . Alcohol use: No    Alcohol/week: 0.0 standard drinks  . Drug use: No  . Sexual activity: Not Currently  Other Topics Concern  . Not on file  Social History Narrative   Married    Physically active hard of hearing   Social Determinants of Health   Financial Resource Strain:   . Difficulty of Paying Living Expenses: Not on file  Food Insecurity:   . Worried About Charity fundraiser in the Last Year: Not on file  . Ran Out of Food in the Last Year: Not on file  Transportation Needs:   . Lack of Transportation (Medical): Not on file  . Lack of Transportation (Non-Medical): Not on file  Physical Activity:   . Days of Exercise per Week: Not on file  . Minutes of Exercise per Session: Not on file  Stress:   . Feeling of Stress : Not on file  Social Connections:   . Frequency of Communication with Friends and Family: Not on file  . Frequency of Social Gatherings with Friends and Family: Not on file  . Attends Religious Services: Not on file  . Active Member of Clubs or Organizations:  Not on file  . Attends Archivist Meetings: Not on file  . Marital Status: Not on file  Intimate Partner Violence:   . Fear of Current or Ex-Partner: Not on file  . Emotionally Abused: Not on file  . Physically Abused: Not on file  . Sexually Abused: Not on file    Family History:   The patient's family history includes Heart attack in his brother. There is no history of Colon cancer.    ROS:  Please see the history of present illness.  All other ROS reviewed and negative.     Physical Exam/Data:   Vitals:   04/11/20 1449 04/11/20 1450  BP: (!) 120/53   Pulse: 75   Resp: 20   Temp: 98.6 F (37 C)   TempSrc: Oral   SpO2: 96%   Weight:  113.4 kg  Height:  5' 11"  (1.803 m)   No intake or output data in the 24 hours ending 04/11/20 1517 Last 3 Weights 04/11/2020 01/18/2020 11/18/2019  Weight (lbs) 250 lb 250 lb 9 oz 256 lb  Weight (kg) 113.399 kg 113.654 kg 116.121 kg     Body mass index is 34.87 kg/m.  General:  Well nourished, well developed, in no acute distress HEENT: normal Lymph: no adenopathy Neck: no JVD Endocrine:  No thryomegaly Vascular: No carotid bruits; FA pulses 2+ bilaterally without bruits  Cardiac:  normal S1, S2; RRR; no murmur  Lungs:  clear to auscultation bilaterally, no wheezing, rhonchi or rales  Abd: soft, nontender, no hepatomegaly  Ext: no edema Musculoskeletal:  No deformities, BUE and BLE strength normal and equal Skin: warm and dry  Neuro:  CNs 2-12 intact, no focal abnormalities noted Psych:  Normal affect    EKG:  The ECG that was done 04/11/20 was personally reviewed and demonstrates NSR, nonspecific ST/T wave changes  Relevant CV Studies:  Echo 06/2017  Study Conclusions   - Left ventricle: The cavity size was normal. Wall thickness was  increased in a pattern of mild LVH. Systolic function was normal.  The estimated ejection fraction was in the range of 60% to 65%.  Wall motion was normal; there were no  regional wall motion  abnormalities. Left ventricular diastolic function parameters  were normal.  - Left atrium: The atrium was mildly dilated.  - Atrial septum: No defect or patent foramen ovale was identified.   Cardiac cath 06/2017  Prox LAD lesion is 85% stenosed.  Prox LAD to Mid LAD lesion is 75% stenosed.  Ost 1st Diag to 1st Diag lesion is 85% stenosed.  Dist LAD lesion is 65% stenosed.  Ost Cx to Mid Cx lesion is 80% stenosed.  Ost 1st Mrg to 1st Mrg lesion is 95% stenosed.  Mid Cx to Dist Cx lesion is 65% stenosed.  2nd Mrg lesion is 75% stenosed.  Prox RCA to Mid RCA lesion is 30% stenosed.  RPDA lesion is 60% stenosed.  1st RPLB lesion is 70% stenosed.  Dist RCA lesion is 99% stenosed.  A drug-eluting stent was successfully placed using a STENT SYNERGY DES 3X12, postdilated to 3.3 mm.  Post intervention, there is a 0% residual stenosis.  LV end diastolic pressure is normal.  There is no aortic valve stenosis.   Left sided disease unchanged from prior.  Culprit was RCA lesion.  D/w Dr. Stanford Breed, and decided to proceed with fixing the culprit lesion given the patient's preference of avoiding CABG.    SYNERGY stent placed so DAPT could be stopped at 3 months if needed.  Otherwise, would recommend DAPT for 1 year given NSTEMI.    Coronary Diagrams  Diagnostic Dominance: Right  Intervention      Laboratory Data:  High Sensitivity Troponin:  No results for input(s): TROPONINIHS in the last 720 hours.    ChemistryNo results for input(s): NA, K, CL, CO2, GLUCOSE, BUN, CREATININE, CALCIUM, GFRNONAA, GFRAA, ANIONGAP in the last 168 hours.  No results for input(s): PROT, ALBUMIN, AST, ALT, ALKPHOS, BILITOT in the last 168 hours. HematologyNo results for input(s): WBC, RBC, HGB, HCT, MCV, MCH, MCHC, RDW, PLT in the last 168 hours. BNPNo results for input(s): BNP, PROBNP in the last 168 hours.  DDimer No results for input(s): DDIMER in the last 168  hours.   Radiology/Studies:  DG Chest Portable 1 View  Result Date: 04/11/2020 CLINICAL DATA:  Chest pain short of breath EXAM: PORTABLE CHEST 1 VIEW COMPARISON:  07/03/2018 FINDINGS: Heart size upper normal.  Normal vascularity. Mild airspace disease in the left costophrenic angle is new. Possible pneumonia or pulmonary infarct. No significant effusion. Right lung clear. IMPRESSION: Small area of airspace disease in the left lateral lung base. Possible pneumonia or pulmonary infarct. Electronically Signed   By: Franchot Gallo M.D.   On: 04/11/2020 14:59     Assessment and Plan:   CAD with known 3V disease, s/p DES to RCA in 2018 - In the past patient wanted to avoid CABG - Presents with substernal chest pressure similar to prior PCI. Took nitro SL x1 and 4 baby aspirin prior to arrival - EKG shows NSR with no new changes - HS troponin still pending. If elevated start IV heparin - check echo  - continue aspirin, statin, BB, Imdur - Plan to admit and trend troponin. Will make NPO for possible cath tomorrow. COVID pending  Chronic Dyspnea - multifactorial given obesity hypoventilation syndrome and deconditioning - No  new or worsening SOB  HTN - PTA HCTZ 12.56m daily, Imdur 30 mg daly, lisinopril 20 mg daily, Lopressor 12.563mBID - Will hold HCTZ and lisinopril in case of cath  HLD - did not tolerate Lipitor in the past - Pravastatin - LDL 41 12/2019  DM2 - last A1C 8.9 12/2019 - SSI during admission    Severity of Illness: The appropriate patient status for this patient is OBSERVATION. Observation status is judged to be reasonable and necessary in order to provide the required intensity of service to ensure the patient's safety. The patient's presenting symptoms, physical exam findings, and initial radiographic and laboratory data in the context of their medical condition is felt to place them at decreased risk for further clinical deterioration. Furthermore, it is anticipated  that the patient will be medically stable for discharge from the hospital within 2 midnights of admission. The following factors support the patient status of observation.   " The patient's presenting symptoms include chest pain. " The physical exam findings include chest pain. " The initial radiographic and laboratory data are unchanged EKG, Troponin pending.     For questions or updates, please contact [facility] consult [email or web address] for contact info under     Signed, Cadence H Ninfa MeekerPA-C  04/11/2020 3:17 PM   I have personally seen and examined this patient. I agree with the assessment and plan as outlined above.  He is known to have severe 3 vessel CAD. Bypass has been discussed in the past but he has not wished to proceed in that direction. RCA stent in December 2018. Doing ok since then with occasional use of SL NTG.  He has been feeling tired for several weeks. No energy. He was working in the barn today and had the onset of severe chest pain with dyspnea and diaphoresis. The pain lasted for an hour. He has no chest pain now.   EKG personally reviewed by me and shows sinus with no ischemic changes Cath films from 2018 reviewed by me  Labs pending My exam: General: Well developed, well nourished, NAD  HEENT: OP clear, mucus membranes moist  SKIN: warm, dry. No rashes. Neuro: No focal deficits  Musculoskeletal: Muscle strength 5/5 all ext  Psychiatric: Mood and affect normal  Neck: No JVD, no carotid bruits, no thyromegaly, no lymphadenopathy.  Lungs:Clear bilaterally, no wheezes, rhonci, crackles Cardiovascular: Regular rate and rhythm. No murmurs, gallops or rubs. Abdomen:Soft. Bowel sounds present. Non-tender.  Extremities: No lower extremity edema. Pulses are 2 + in the bilateral DP/PT.  Plan: CAD with unstable angina: Will admit to telemetry. Cycle troponin. Echo tomorrow. Will continue ASA, statin, beta blocker and Imdur. Will start IV heparin. Possible  cardiac cath tomorrow pending lab results.   ChLauree Chandler0/05/2020 4:20 PM

## 2020-04-11 NOTE — ED Notes (Signed)
Dinner Tray Order @ [phone].

## 2020-04-11 NOTE — ED Notes (Signed)
Wife states that she is going home, would like to be called if any changes occur at [phone].

## 2020-04-11 NOTE — ED Provider Notes (Signed)
[facility] EMERGENCY DEPARTMENT Provider Note   CSN: [id number] Arrival date & time:        History Chief Complaint  Patient presents with  . Chest Pain    Ryan Garcia is a 86 y.o. male.  The history is provided by the patient and medical records. No language interpreter was used.  Chest Pain Pain location:  Substernal area Pain quality: aching, crushing and pressure   Pain radiates to:  Does not radiate Pain severity:  Severe Onset quality:  Sudden Timing:  Constant Progression:  Resolved Chronicity:  Recurrent Relieved by:  Nothing Worsened by:  Nothing Ineffective treatments:  None tried Associated symptoms: diaphoresis, nausea and shortness of breath   Associated symptoms: no altered mental status, no back pain, no claudication, no cough, no dizziness, no fatigue, no fever, no headache, no lower extremity edema, no palpitations, no vomiting and no weakness   Risk factors: coronary artery disease, diabetes mellitus, high cholesterol, hypertension and male sex   Risk factors: no prior DVT/PE        Past Medical History:  Diagnosis Date  . AC (acromioclavicular) joint bone spurs    lt ankle  . Allergy    allergic rhinitis  . Arthritis    OA  . BPH (benign prostatic hyperplasia)    microwave tx of prostate  . CAD (coronary artery disease)    a. s/p cath in 2012 showing 70-75% LAD stenosis, 75% D1 stenosis, 75% OM1, 60-70% RCA stenosis, and 80% PDA --> medical therapy pursued b. similar findings by cath in 2016; 06/2017 DES to distal RCA  . Chronic upper back pain   . Diabetes mellitus    type II x8 years  . GERD (gastroesophageal reflux disease)   . HLD (hyperlipidemia)    10/12: TC 208, TG 213, HDL 36, LDL 129  . Hx of skin cancer, basal cell    many skin cancers removed/under constant treatment.  . Hypertension   . NSTEMI (non-ST elevated myocardial infarction) ([place]) 06/18/2017   DES to distal RCA  . Obstructive sleep apnea on CPAP     . Scoliosis     Patient Active Problem List   Diagnosis Date Noted  . Left elbow pain 11/18/2019  . Pain and swelling of right lower leg 06/23/2019  . Coronary artery disease involving native coronary artery of native heart with angina pectoris ([place]) 01/18/2019  . Medicare annual wellness visit, subsequent 01/16/2019  . Ecchymosis 10/22/2018  . Obesity (BMI 30-39.9) 07/09/2018  . HOH (hard of hearing) 03/31/2018  . Uncontrolled type 2 diabetes mellitus with hyperglycemia ([place]) 10/28/2017  . Subclinical hypothyroidism 10/28/2017  . Scoliosis   . Obstructive sleep apnea on CPAP   . Hx of skin cancer, basal cell   . Hyperlipidemia associated with type 2 diabetes mellitus ([place])   . GERD (gastroesophageal reflux disease)   . Chronic upper back pain   . Arthritis   . Allergy   . NSTEMI (non-ST elevated myocardial infarction) ([place]) 06/18/2017  . Actinic keratoses 04/29/2017  . Positive colorectal cancer screening using Cologuard test 10/31/2016  . BPH (benign prostatic hyperplasia) 09/04/2016  . B12 deficiency 11/22/2015  . Routine general medical examination at a health care facility 08/26/2015  . Abnormal nuclear cardiac imaging test   . Dyspnea on exertion 11/27/2013  . Encounter for examination of normal volunteer in research study 05/29/2013  . Prostate cancer screening 07/15/2012  . CAD S/P percutaneous coronary angioplasty 05/17/2011  . Nonspecific abnormal results  of cardiovascular function study 04/30/2011  . Abnormal EKG 04/06/2011  . Right low back pain 01/13/2010  . Insulin dependent diabetes mellitus 10/04/2006  . ERECTILE DYSFUNCTION 10/04/2006  . Essential hypertension 10/04/2006  . Allergic rhinitis 10/04/2006  . OSTEOARTHRITIS 10/04/2006  . Sleep apnea 10/04/2006  . EDEMA 10/04/2006  . SKIN CANCER, HX OF 10/04/2006    Past Surgical History:  Procedure Laterality Date  . BREAST SURGERY  2009   breast lump removed benign  . CARDIAC CATHETERIZATION N/A  03/15/2015   Procedure: Left Heart Cath and Coronary Angiography;  Surgeon: Belva Crome, MD;  Location: [facility];  Service: Cardiovascular;  Laterality: N/A;  . CORONARY STENT INTERVENTION N/A 06/18/2017   Procedure: CORONARY STENT INTERVENTION;  Surgeon: Jettie Booze, MD;  Location: [facility];  Service: Cardiovascular;  Laterality: N/A;  RCA  . LEFT HEART CATH AND CORONARY ANGIOGRAPHY N/A 06/18/2017   Procedure: LEFT HEART CATH AND CORONARY ANGIOGRAPHY;  Surgeon: Jettie Booze, MD;  Location: [facility];  Service: Cardiovascular;  Laterality: N/A;  . ULTRASOUND GUIDANCE FOR VASCULAR ACCESS  06/18/2017   Procedure: Ultrasound Guidance For Vascular Access;  Surgeon: Jettie Booze, MD;  Location: [facility];  Service: Cardiovascular;;  right radial, right femoral       Family History  Problem Relation Age of Onset  . Heart attack Brother   . Colon cancer Neg Hx     Social History   Tobacco Use  . Smoking status: Never Smoker  . Smokeless tobacco: Never Used  Vaping Use  . Vaping Use: Never used  Substance Use Topics  . Alcohol use: No    Alcohol/week: 0.0 standard drinks  . Drug use: No    Home Medications Prior to Admission medications   Medication Sig Start Date End Date Taking? Authorizing Provider  aspirin 81 MG tablet Take 81 mg by mouth daily.      [provider]  Blood Glucose Monitoring Suppl (TRUE METRIX AIR GLUCOSE METER) W/DEVICE KIT 1 kit by Other route 2 (two) times daily. Check blood sugar twice daily and as directed. Dx E11.65 03/17/15   Tower, Wynelle Fanny, MD  cetirizine (ZYRTEC) 10 MG tablet TAKE 1 TABLET EVERY DAY 11/14/16   Tower, Wynelle Fanny, MD  Coenzyme Q10 (CO Q 10 PO) Take 200 mg by mouth daily.    [provider]  Cyanocobalamin (VITAMIN B12 PO) Take 1,000 mg by mouth daily.     [provider]  diclofenac Sodium (VOLTAREN) 1 % GEL Apply 2 g topically 4 (four) times daily. To  elbow/affected area 11/18/19   Tower, Wynelle Fanny, MD  DROPLET PEN NEEDLES 31G X 6 MM MISC USE ONE TIME DAILY  AT  BEDTIME  WITH  LANTUS 02/10/20   Tower, Wynelle Fanny, MD  fluticasone (FLONASE) 50 MCG/ACT nasal spray Place 1 spray into both nostrils daily.    [provider]  glipiZIDE (GLUCOTROL) 10 MG tablet TAKE 1 TABLET TWICE DAILY WITH MEALS 12/15/19   Tower, Lockwood A, MD  hydrochlorothiazide (HYDRODIURIL) 12.5 MG tablet TAKE 1 TABLET EVERY DAY 03/09/20   Lelon Perla, MD  insulin glargine (LANTUS SOLOSTAR) 100 UNIT/ML Solostar Pen INJECT 60 UNITS SUBCUTANEOUSLY AT BEDTIME 01/20/20   Tower, Wynelle Fanny, MD  isosorbide mononitrate (IMDUR) 30 MG 24 hr tablet Take 1 tablet (30 mg total) by mouth daily. 11/26/19   Lelon Perla, MD  lisinopril (ZESTRIL) 20 MG tablet TAKE 1 TABLET  EVERY DAY 11/17/19   Lelon Perla, MD  metFORMIN (GLUCOPHAGE) 1000 MG tablet TAKE 1 TABLET TWICE DAILY 03/10/20   Tower, Wynelle Fanny, MD  metoprolol tartrate (LOPRESSOR) 25 MG tablet Take 0.5 tablets (12.5 mg total) by mouth 2 (two) times daily. 11/05/19   Lelon Perla, MD  nitroGLYCERIN (NITROSTAT) 0.4 MG SL tablet Place 1 tablet (0.4 mg total) under the tongue every 5 (five) minutes as needed for chest pain. 05/11/19   [facility], Tami Lin, PA  pravastatin (PRAVACHOL) 80 MG tablet Take 80 mg by mouth daily.    [provider]  TRUE METRIX BLOOD GLUCOSE TEST test strip CHECK BLOOD SUGAR TWICE DAILY 12/15/19   Tower, Wynelle Fanny, MD  TRUEPLUS LANCETS 30G MISC Check blood sugar twice daily and as directed. Dx E11.65 03/17/15   Abner Greenspan, MD    Allergies    Loratadine and Simvastatin  Review of Systems   Review of Systems  Constitutional: Positive for diaphoresis. Negative for chills, fatigue and fever.  HENT: Negative for congestion.   Eyes: Negative for visual disturbance.  Respiratory: Positive for shortness of breath. Negative for cough, chest tightness and wheezing.   Cardiovascular: Positive for chest  pain. Negative for palpitations, claudication and leg swelling.  Gastrointestinal: Positive for nausea. Negative for constipation, diarrhea and vomiting.  Genitourinary: Negative for dysuria.  Musculoskeletal: Negative for back pain, neck pain and neck stiffness.  Neurological: Positive for light-headedness. Negative for dizziness, seizures, syncope, weakness and headaches.  Psychiatric/Behavioral: Negative for agitation.  All other systems reviewed and are negative.   Physical Exam Updated Vital Signs BP (!) 120/53 (BP Location: Right Arm)   Pulse 75   Temp 98.6 F (37 C) (Oral)   Resp 20   Ht 5' 11"  (1.803 m)   Wt 113.4 kg   SpO2 96%   BMI 34.87 kg/m   Physical Exam Vitals and nursing note reviewed.  Constitutional:      General: He is not in acute distress.    Appearance: He is well-developed. He is not ill-appearing, toxic-appearing or diaphoretic.  HENT:     Head: Normocephalic and atraumatic.  Eyes:     Conjunctiva/sclera: Conjunctivae normal.  Cardiovascular:     Rate and Rhythm: Normal rate and regular rhythm.     Heart sounds: Normal heart sounds. No murmur heard.   Pulmonary:     Effort: Pulmonary effort is normal. No respiratory distress.     Breath sounds: Normal breath sounds. No decreased breath sounds, wheezing, rhonchi or rales.  Chest:     Chest wall: No tenderness.  Abdominal:     Palpations: Abdomen is soft.     Tenderness: There is no abdominal tenderness.  Musculoskeletal:     Cervical back: Neck supple.     Right lower leg: No tenderness.     Left lower leg: No tenderness.  Skin:    General: Skin is warm and dry.     Capillary Refill: Capillary refill takes less than 2 seconds.  Neurological:     General: No focal deficit present.     Mental Status: He is alert.  Psychiatric:        Mood and Affect: Mood normal.     ED Results / Procedures / Treatments   Labs (all labs ordered are listed, but only abnormal results are displayed) Labs  Reviewed  RESPIRATORY PANEL BY RT PCR (FLU A&B, COVID)  CBC WITH DIFFERENTIAL/PLATELET  COMPREHENSIVE METABOLIC PANEL  LIPASE, BLOOD  TROPONIN I (  HIGH SENSITIVITY)    EKG EKG Interpretation  Date/Time:  Monday April 11 2020 14:22:35 EDT Ventricular Rate:  71 PR Interval:    QRS Duration: 94 QT Interval:  393 QTC Calculation: 428 R Axis:   71 Text Interpretation: Sinus rhythm Paired ventricular premature complexes Aberrant conduction of SV complex(es) When cokmpared to prior, new PVC. No STEMI Confirmed by Antony Blackbird [phone]) on 04/11/2020 2:25:40 PM   Radiology DG Chest Portable 1 View  Result Date: 04/11/2020 CLINICAL DATA:  Chest pain short of breath EXAM: PORTABLE CHEST 1 VIEW COMPARISON:  07/03/2018 FINDINGS: Heart size upper normal.  Normal vascularity. Mild airspace disease in the left costophrenic angle is new. Possible pneumonia or pulmonary infarct. No significant effusion. Right lung clear. IMPRESSION: Small area of airspace disease in the left lateral lung base. Possible pneumonia or pulmonary infarct. Electronically Signed   By: Franchot Gallo M.D.   On: 04/11/2020 14:59    Procedures Procedures (including critical care time)  Medications Ordered in ED Medications - No data to display  ED Course  I have reviewed the triage vital signs and the nursing notes.  Pertinent labs & imaging results that were available during my care of the patient were reviewed by me and considered in my medical decision making (see chart for details).    MDM Rules/Calculators/A&P                          Ryan Garcia is a 86 y.o. male with a past medical history significant for CAD status post PCI, hypertension, diabetes, hyperlipidemia, GERD, sleep apnea, and arthritis who presents with chest pain.  Patient reports that he was working in his barn earlier today and was feeling very fatigued and tired.  He reportedly went and sat down in a chair and then started having crushing  chest pain.  He reports was in his central chest and did not radiate.  He thinks feels similar in quality and location to when he had an STEMI in the past requiring PCI.  He reports some nausea, lightheadedness, and diaphoresis.  He reports shortness of breath with it.  He took several nitroglycerin at home with minimal relief however when EMS got to him and he was given aspirin, his symptoms began to improve.  He is now chest pain-free.  He reports has been feeling well recently and denies recent fevers, chills, ingestion, cough.  Denies any recent leg pain, leg swelling, or worsened peripheral edema.  He denies any abdominal pain or back pain at this time.  On arrival, EKG shows no STEMI.  On exam, lungs are clear and chest is nontender.  No murmur.  Good pulses in all extremities.  Legs are not significantly tender.  Normal bowel sounds.  Abdomen nontender.  Call cardiology due to high concern for a cardiac cause of his chest discomfort.  They will come see the patient.  Patient will have screening labs, chest x-ray, and will get a Covid test as well.  Anticipate following up on cardiology recommendations for further management of high risk chest pain similar to prior MI.   Final Clinical Impression(s) / ED Diagnoses Final diagnoses:  Precordial pain  Shortness of breath  Diaphoresis    Clinical Impression: 1. Precordial pain   2. Shortness of breath   3. Diaphoresis     Disposition: Awaiting results of diagnostic work-up and cardiology evaluation and recommendations.  This note was prepared with assistance of Dragon voice  recognition software. Occasional wrong-word or sound-a-like substitutions may have occurred due to the inherent limitations of voice recognition software.     Isatu Macinnes, Gwenyth Allegra, MD 04/11/20 [phone]

## 2020-04-11 NOTE — ED Notes (Signed)
Pt placed in hospital bed for comfort.

## 2020-04-11 NOTE — ED Triage Notes (Signed)
Patient presents to ed via [facility] states he was at home c/o chest pain, hx. Of stents in the past. States he was working in his barn went in to rest and while sitting in a chair got very diaphoretic  With chest pressure, pain was 8/10 took asa 324mg  and 1 ntg per self. Denies chest pain upon arrival.

## 2020-04-12 ENCOUNTER — Encounter (HOSPITAL_COMMUNITY)
Admission: EM | Disposition: A | Discharge status: Home / Self Care | Payer: Self-pay | Source: Home / Self Care | Attending: Emergency Medicine

## 2020-04-12 ENCOUNTER — Observation Stay (HOSPITAL_BASED_OUTPATIENT_CLINIC_OR_DEPARTMENT_OTHER): Payer: Medicare HMO

## 2020-04-12 ENCOUNTER — Telehealth: Payer: Self-pay | Admitting: Medical

## 2020-04-12 DIAGNOSIS — R079 Chest pain, unspecified: Secondary | ICD-10-CM

## 2020-04-12 DIAGNOSIS — I200 Unstable angina: Secondary | ICD-10-CM | POA: Diagnosis not present

## 2020-04-12 DIAGNOSIS — I25110 Atherosclerotic heart disease of native coronary artery with unstable angina pectoris: Secondary | ICD-10-CM | POA: Diagnosis not present

## 2020-04-12 HISTORY — PX: LEFT HEART CATH AND CORONARY ANGIOGRAPHY: CATH118249

## 2020-04-12 LAB — BASIC METABOLIC PANEL
Anion gap: 11 (ref 5–15)
BUN: 28 mg/dL — ABNORMAL HIGH (ref 8–23)
CO2: 21 mmol/L — ABNORMAL LOW (ref 22–32)
Calcium: 8.8 mg/dL — ABNORMAL LOW (ref 8.9–10.3)
Chloride: 105 mmol/L (ref 98–111)
Creatinine, Ser: 1.45 mg/dL — ABNORMAL HIGH (ref 0.61–1.24)
GFR, Estimated: 44 mL/min — ABNORMAL LOW (ref >60)
Glucose, Bld: 212 mg/dL — ABNORMAL HIGH (ref 70–99)
Potassium: 4.4 mmol/L (ref 3.5–5.1)
Sodium: 137 mmol/L (ref 135–145)

## 2020-04-12 LAB — ECHOCARDIOGRAM COMPLETE
AR max vel: 2.31 cm2
AV Area VTI: 2.46 cm2
AV Area mean vel: 2.19 cm2
AV Mean grad: 6 mmHg
AV Peak grad: 10.4 mmHg
Ao pk vel: 1.61 m/s
Height: 71 in
S' Lateral: 2.5 cm
Weight: 4000 oz

## 2020-04-12 LAB — GLUCOSE, CAPILLARY: Glucose-Capillary: 152 mg/dL — ABNORMAL HIGH (ref 70–99)

## 2020-04-12 LAB — CBC
HCT: 36.3 % — ABNORMAL LOW (ref 39.0–52.0)
Hemoglobin: 11.8 g/dL — ABNORMAL LOW (ref 13.0–17.0)
MCH: 30.1 pg (ref 26.0–34.0)
MCHC: 32.5 g/dL (ref 30.0–36.0)
MCV: 92.6 fL (ref 80.0–100.0)
Platelets: 153 10*3/uL (ref 150–400)
RBC: 3.92 MIL/uL — ABNORMAL LOW (ref 4.22–5.81)
RDW: 15.1 % (ref 11.5–15.5)
WBC: 7.2 10*3/uL (ref 4.0–10.5)
nRBC: 0 % (ref 0.0–0.2)

## 2020-04-12 LAB — CBG MONITORING, ED: Glucose-Capillary: 176 mg/dL — ABNORMAL HIGH (ref 70–99)

## 2020-04-12 SURGERY — LEFT HEART CATH AND CORONARY ANGIOGRAPHY
Anesthesia: LOCAL

## 2020-04-12 MED ORDER — HEPARIN SODIUM (PORCINE) 1000 UNIT/ML IJ SOLN
INTRAMUSCULAR | Status: AC
  Filled 2020-04-12: qty 1

## 2020-04-12 MED ORDER — ROSUVASTATIN CALCIUM 20 MG PO TABS
40.0000 mg | ORAL_TABLET | Freq: Every day | ORAL | Status: DC
  Filled 2020-04-12: qty 2

## 2020-04-12 MED ORDER — SODIUM CHLORIDE 0.9% FLUSH: 3.0000 mL | INTRAVENOUS | Status: DC | PRN

## 2020-04-12 MED ORDER — HEPARIN (PORCINE) IN NACL 1000-0.9 UT/500ML-% IV SOLN
INTRAVENOUS | Status: DC | PRN
  Administered 2020-04-12 (×2): 500 mL

## 2020-04-12 MED ORDER — LIDOCAINE HCL (PF) 1 % IJ SOLN
INTRAMUSCULAR | Status: DC | PRN
  Administered 2020-04-12: 2 mL

## 2020-04-12 MED ORDER — PERFLUTREN LIPID MICROSPHERE
1.0000 mL | INTRAVENOUS | Status: AC | PRN
  Administered 2020-04-12: 5 mL via INTRAVENOUS
  Filled 2020-04-12: qty 10

## 2020-04-12 MED ORDER — MIDAZOLAM HCL 2 MG/2ML IJ SOLN
INTRAMUSCULAR | Status: AC
  Filled 2020-04-12: qty 2

## 2020-04-12 MED ORDER — VERAPAMIL HCL 2.5 MG/ML IV SOLN
INTRAVENOUS | Status: DC | PRN
  Administered 2020-04-12: 10 mL via INTRA_ARTERIAL

## 2020-04-12 MED ORDER — VITAMIN B12 1000 MCG PO TBCR: 1000.0000 ug | EXTENDED_RELEASE_TABLET | Freq: Every day | ORAL | Status: DC

## 2020-04-12 MED ORDER — FENTANYL CITRATE (PF) 100 MCG/2ML IJ SOLN
INTRAMUSCULAR | Status: DC | PRN
  Administered 2020-04-12: 25 ug via INTRAVENOUS

## 2020-04-12 MED ORDER — HEPARIN SODIUM (PORCINE) 1000 UNIT/ML IJ SOLN
INTRAMUSCULAR | Status: DC | PRN
  Administered 2020-04-12: 5000 [IU] via INTRAVENOUS

## 2020-04-12 MED ORDER — FENTANYL CITRATE (PF) 100 MCG/2ML IJ SOLN
INTRAMUSCULAR | Status: AC
  Filled 2020-04-12: qty 2

## 2020-04-12 MED ORDER — MIDAZOLAM HCL 2 MG/2ML IJ SOLN
INTRAMUSCULAR | Status: DC | PRN
  Administered 2020-04-12: 1 mg via INTRAVENOUS

## 2020-04-12 MED ORDER — SODIUM CHLORIDE 0.9 % WEIGHT BASED INFUSION: 1.0000 mL/kg/h | INTRAVENOUS | Status: DC

## 2020-04-12 MED ORDER — IOHEXOL 350 MG/ML SOLN
INTRAVENOUS | Status: DC | PRN
  Administered 2020-04-12: 40 mL

## 2020-04-12 MED ORDER — SODIUM CHLORIDE 0.9% FLUSH: 3.0000 mL | Freq: Two times a day (BID) | INTRAVENOUS | Status: DC

## 2020-04-12 MED ORDER — SODIUM CHLORIDE 0.9 % IV SOLN: 250.0000 mL | INTRAVENOUS | Status: DC | PRN

## 2020-04-12 MED ORDER — LIDOCAINE HCL (PF) 1 % IJ SOLN
INTRAMUSCULAR | Status: AC
  Filled 2020-04-12: qty 30

## 2020-04-12 MED ORDER — ASPIRIN 81 MG PO CHEW: 81.0000 mg | CHEWABLE_TABLET | ORAL | Status: DC

## 2020-04-12 MED ORDER — SODIUM CHLORIDE 0.9 % IV SOLN: INTRAVENOUS | Status: DC

## 2020-04-12 MED ORDER — VERAPAMIL HCL 2.5 MG/ML IV SOLN
INTRAVENOUS | Status: AC
  Filled 2020-04-12: qty 2

## 2020-04-12 MED ORDER — HEPARIN (PORCINE) IN NACL 1000-0.9 UT/500ML-% IV SOLN
INTRAVENOUS | Status: AC
  Filled 2020-04-12: qty 1000

## 2020-04-12 SURGICAL SUPPLY — 9 items
CATH INFINITI 5FR MULTPACK ANG (CATHETERS) ×1 IMPLANT
DEVICE RAD COMP TR BAND LRG (VASCULAR PRODUCTS) ×1 IMPLANT
GLIDESHEATH SLEND SS 6F .021 (SHEATH) ×1 IMPLANT
GUIDEWIRE INQWIRE 1.5J.035X260 (WIRE) IMPLANT
INQWIRE 1.5J .035X260CM (WIRE) ×2
KIT HEART LEFT (KITS) ×2 IMPLANT
PACK CARDIAC CATHETERIZATION (CUSTOM PROCEDURE TRAY) ×2 IMPLANT
TRANSDUCER W/STOPCOCK (MISCELLANEOUS) ×2 IMPLANT
TUBING CIL FLEX 10 FLL-RA (TUBING) ×2 IMPLANT

## 2020-04-12 NOTE — Progress Notes (Signed)
Progress Note  Patient Name: MARSHALL KAMPF Date of Encounter: 04/12/2020  [facility] Cardiologist: Kirk Ruths, MD   Subjective   No chest pain this am.   Inpatient Medications    Scheduled Meds: . aspirin  324 mg Oral NOW   Or  . aspirin  300 mg Rectal NOW  . aspirin EC  81 mg Oral Daily  . heparin  5,000 Units Subcutaneous Q8H  . insulin aspart  0-15 Units Subcutaneous TID WC  . insulin aspart  0-5 Units Subcutaneous QHS  . isosorbide mononitrate  30 mg Oral Daily  . metoprolol tartrate  12.5 mg Oral BID  . pravastatin  80 mg Oral Daily  . sodium chloride flush  3 mL Intravenous Q12H   Continuous Infusions: . sodium chloride     PRN Meds: sodium chloride, acetaminophen, ALPRAZolam, nitroGLYCERIN, ondansetron (ZOFRAN) IV, sodium chloride flush, zolpidem   Vital Signs    Vitals:   04/11/20 2230 04/12/20 0130 04/12/20 0500 04/12/20 0600  BP:   (!) 111/56 116/62  Pulse: 69 80 83 84  Resp: (!) 23 (!) 26 (!) 21 (!) 22  Temp:      TempSrc:      SpO2: 96% 96% 96% 95%  Weight:      Height:       No intake or output data in the 24 hours ending 04/12/20 0756 Last 3 Weights 04/11/2020 01/18/2020 11/18/2019  Weight (lbs) 250 lb 250 lb 9 oz 256 lb  Weight (kg) 113.399 kg 113.654 kg 116.121 kg      Telemetry    Sinus - Personally Reviewed  ECG    No AM tracing  Physical Exam   GEN: No acute distress.   Neck: No JVD Cardiac: RRR, no murmurs, rubs, or gallops.  Respiratory: Clear to auscultation bilaterally. GI: Soft, nontender, non-distended  MS: No edema; No deformity. Neuro:  Nonfocal  Psych: Normal affect   Labs    High Sensitivity Troponin:   Recent Labs  Lab 04/11/20 1437 04/11/20 1709  TROPONINIHS 8 7      Chemistry Recent Labs  Lab 04/11/20 1437 04/11/20 1804 04/12/20 0233  NA 140  --  137  K 4.4  --  4.4  CL 107  --  105  CO2 22  --  21*  GLUCOSE 210*  --  212*  BUN 28*  --  28*  CREATININE 1.59* 1.51* 1.45*  CALCIUM 8.7*   --  8.8*  PROT 6.1*  --   --   ALBUMIN 3.3*  --   --   AST 87*  --   --   ALT 46*  --   --   ALKPHOS 84  --   --   BILITOT 1.5*  --   --   GFRNONAA 39* 42* 44*  ANIONGAP 11  --  11     Hematology Recent Labs  Lab 04/11/20 1437 04/12/20 0233  WBC 9.9 7.2  RBC 4.04* 3.92*  HGB 12.2* 11.8*  HCT 38.8* 36.3*  MCV 96.0 92.6  MCH 30.2 30.1  MCHC 31.4 32.5  RDW 15.2 15.1  PLT 165 153    BNPNo results for input(s): BNP, PROBNP in the last 168 hours.   DDimer No results for input(s): DDIMER in the last 168 hours.   Radiology    DG Chest Portable 1 View  Result Date: 04/11/2020 CLINICAL DATA:  Chest pain short of breath EXAM: PORTABLE CHEST 1 VIEW COMPARISON:  07/03/2018 FINDINGS: Heart size  upper normal.  Normal vascularity. Mild airspace disease in the left costophrenic angle is new. Possible pneumonia or pulmonary infarct. No significant effusion. Right lung clear. IMPRESSION: Small area of airspace disease in the left lateral lung base. Possible pneumonia or pulmonary infarct. Electronically Signed   By: Franchot Gallo M.D.   On: 04/11/2020 14:59    Cardiac Studies     Patient Profile     86 y.o. male with history of severe three vessel CAD, prior stenting of the RCA, DM, HTN admitted with chest pain.   Assessment & Plan    1. CAD with unstable angina: His episode yesterday was consistent with cardiac pain. He has had progressive dyspnea and fatigue for several weeks. Troponin is negative. We have discussed proceeding with a cardiac cath today. He would like to proceed.  I have reviewed the risks, indications, and alternatives to cardiac catheterization, possible angioplasty, and stenting with the patient. Risks include but are not limited to bleeding, infection, vascular injury, stroke, myocardial infection, arrhythmia, kidney injury, radiation-related injury in the case of prolonged fluoroscopy use, emergency cardiac surgery, and death. The patient understands the risks  of serious complication is 1-2 in 9470 with diagnostic cardiac cath and 1-2% or less with angioplasty/stenting.  For questions or updates, please contact [facility] Please consult [email or web address] for contact info under        Signed, Lauree Chandler, MD  04/12/2020, 7:56 AM   d

## 2020-04-12 NOTE — Discharge Summary (Addendum)
Discharge Summary    Patient ID: Ryan Garcia MRN: [id number]; DOB: [birth year]  Admit date: 04/11/2020 Discharge date: 04/12/2020  Primary Care Provider: Abner Greenspan, MD  Primary Cardiologist: Kirk Ruths, MD  Primary Electrophysiologist:  None   Discharge Diagnoses    Principal Problem:   Unstable angina [facility]) Active Problems:   Hyperlipidemia associated with type 2 diabetes mellitus ([place])   Uncontrolled type 2 diabetes mellitus with hyperglycemia ([place])   Coronary artery disease involving native coronary artery of native heart with angina pectoris [facility])   Chest pain   Diagnostic Studies/Procedures    Cath: 04/12/20  Prox LAD lesion is 85% stenosed. Ost 1st Diag to 1st Diag lesion is 85% stenosed. Dist LAD lesion is 65% stenosed. Prox LAD to Mid LAD lesion is 75% stenosed. Ost Cx to Mid Cx lesion is 80% stenosed. Ost 1st Mrg to 1st Mrg lesion is 95% stenosed. Mid Cx to Dist Cx lesion is 65% stenosed. 2nd Mrg lesion is 75% stenosed. RPDA lesion is 60% stenosed. 1st RPL lesion is 70% stenosed. Prox RCA to Mid RCA lesion is 30% stenosed. Previously placed Dist RCA drug eluting stent is widely patent. Balloon angioplasty was performed. LV end diastolic pressure is normal.   1. Severe 2 vessel obstructive CAD 2. Patent stent in the RCA 3. Normal LVEDP   Plan: the severe disease in the left coronary distribution is unchanged from prior. No suitable targets for PCI. Recommend continued medical therapy. If refractory angina would need to consider CABG.   Diagnostic Dominance: Right  Echo: 04/12/20   IMPRESSIONS     1. Left ventricular ejection fraction, by estimation, is 60 to 65%. The  left ventricle has normal function. The left ventricle demonstrates  regional wall motion abnormalities (see scoring diagram/findings for  description). Left ventricular diastolic  parameters are consistent with Grade I diastolic dysfunction (impaired  relaxation). Elevated  left atrial pressure. There is mild hypokinesis of  the left ventricular, basal inferior wall.   2. Right ventricular systolic function is normal. The right ventricular  size is normal.   3. Left atrial size was mildly dilated.   4. Right atrial size was mildly dilated.   5. The mitral valve is normal in structure. No evidence of mitral valve  regurgitation. No evidence of mitral stenosis.   6. The aortic valve is normal in structure. Aortic valve regurgitation is  not visualized. Mild aortic valve sclerosis is present, with no evidence  of aortic valve stenosis.   7. The inferior vena cava is normal in size with greater than 50%  respiratory variability, suggesting right atrial pressure of 3 mmHg.  _____________   History of Present Illness     Ryan Garcia is a 86 y.o. male with pmh of CAD with 3V disease treated medically by heart cath in 2012 and 2016 (wanted to avoid CABG), NSTEMI 06/2017 with cath showing new RCA lesion treated with DES, OSA on COAO HTN, DM2, chronic DOE, occasional pedal edema who presented with chest pain.  Ryan Garcia is followed by Dr. Stanford Breed. CAD history of above.NSTEMI in 2018 with DES to RCA. Echo at that time showed  Normal LV function and mild LAE. He was last seen 09/08/19 and was symptomatically stable from a cardiac perspective.    He presented to the ED for chest pain. [place] for the last couple days patient felt a some fatigue. The morning of admission he woke up and  at breakfast around 10 AM. He went to the barn to do some work. He started having chest discomfort and went inside and sat down. The chest pain was worse. It was substernal and non-radiating. It was pressure like in nature. It got as high as 9/10. He took Sl nitro x 1. He had associated sob and diaphoresis. No nausea or vomiting. EMS was called and he was instructed to take 4 baby aspirin. EMS took him to ER for further evaluation. Pain lasted about an  hour total.    In the ED BP 120/53, pulse 75, afebrile, RR 20, 96% O2. Labs pending. EKG shows no new ischemic changes. CXR showed small area of airspace disease in the left lateral lung base with possible PNA or pulmonary infarct. He was admitted to cardiology for further management.     Hospital Course     1. CAD with known 3V disease, s/p DES to RCA in 2018: Underwent cardiac catheterization noted above with known severe multivessel disease but unchanged from prior catheterization.  No suitable targets for PCI.  Plan to continue medical therapy.  If refractory angina would need to reconsider CABG evaluation.  Echo with EF of 60 to 65%, mild hypokinesis of the LV and basal inferior wall with grade 1 diastolic dysfunction. --Continue aspirin, statin, beta-blocker, ACE and Imdur --Post cath instructions/restrictions given in short stay.   2. Chronic Dyspnea: multifactorial given obesity hypoventilation syndrome and deconditioning -- No new or worsening SOB   3. HTN: Stable on home medication. --Resume HCTZ 12.77m daily, Imdur 30 mg daly, lisinopril 20 mg daily, Lopressor 12.583mBID  4. HLD: did not tolerate Lipitor in the past -- Pravastatin -- LDL 41 12/2019   5. DM2: Hemoglobin A1c down from 8.9-7 --Continue home insulin dose, glipizide.  Instructed to hold Metformin for 48 hours post cath.  Did the patient have an acute coronary syndrome (MI, NSTEMI, STEMI, etc) this admission?:  No                               Did the patient have a percutaneous coronary intervention (stent / angioplasty)?:  No.   _____________  Discharge Vitals Blood pressure 128/61, pulse 92, temperature 98.6 F (37 C), temperature source Oral, resp. rate 20, height 5' 11"  (1.803 m), weight 113.4 kg, SpO2 92 %.  Filed Weights   04/11/20 1450  Weight: 113.4 kg    Labs & Radiologic Studies    CBC Recent Labs    04/11/20 1437 04/12/20 0233  WBC 9.9 7.2  NEUTROABS 6.8  --   HGB 12.2* 11.8*  HCT 38.8*  36.3*  MCV 96.0 92.6  PLT 165 15185 Basic Metabolic Panel Recent Labs    04/11/20 1437 04/11/20 1437 04/11/20 1804 04/12/20 0233  NA 140  --   --  137  K 4.4  --   --  4.4  CL 107  --   --  105  CO2 22  --   --  21*  GLUCOSE 210*  --   --  212*  BUN 28*  --   --  28*  CREATININE 1.59*   < > 1.51* 1.45*  CALCIUM 8.7*  --   --  8.8*   < > = values in this interval not displayed.   Liver Function Tests Recent Labs    04/11/20 1437  AST 87*  ALT 46*  ALKPHOS 84  BILITOT 1.5*  PROT 6.1*  ALBUMIN 3.3*   Recent Labs    04/11/20 1437  LIPASE 23   High Sensitivity Troponin:   Recent Labs  Lab 04/11/20 1437 04/11/20 1709  TROPONINIHS 8 7    BNP Invalid input(s): POCBNP D-Dimer No results for input(s): DDIMER in the last 72 hours. Hemoglobin A1C Recent Labs    04/11/20 1804  HGBA1C 7.0*   Fasting Lipid Panel No results for input(s): CHOL, HDL, LDLCALC, TRIG, CHOLHDL, LDLDIRECT in the last 72 hours. Thyroid Function Tests No results for input(s): TSH, T4TOTAL, T3FREE, THYROIDAB in the last 72 hours.  Invalid input(s): FREET3 _____________  CARDIAC CATHETERIZATION  Result Date: 04/12/2020  Prox LAD lesion is 85% stenosed.  Ost 1st Diag to 1st Diag lesion is 85% stenosed.  Dist LAD lesion is 65% stenosed.  Prox LAD to Mid LAD lesion is 75% stenosed.  Ost Cx to Mid Cx lesion is 80% stenosed.  Ost 1st Mrg to 1st Mrg lesion is 95% stenosed.  Mid Cx to Dist Cx lesion is 65% stenosed.  2nd Mrg lesion is 75% stenosed.  RPDA lesion is 60% stenosed.  1st RPL lesion is 70% stenosed.  Prox RCA to Mid RCA lesion is 30% stenosed.  Previously placed Dist RCA drug eluting stent is widely patent.  Balloon angioplasty was performed.  LV end diastolic pressure is normal.  1. Severe 2 vessel obstructive CAD 2. Patent stent in the RCA 3. Normal LVEDP Plan: the severe disease in the left coronary distribution is unchanged from prior. No suitable targets for PCI. Recommend  continued medical therapy. If refractory angina would need to consider CABG.   DG Chest Portable 1 View  Result Date: 04/11/2020 CLINICAL DATA:  Chest pain short of breath EXAM: PORTABLE CHEST 1 VIEW COMPARISON:  07/03/2018 FINDINGS: Heart size upper normal.  Normal vascularity. Mild airspace disease in the left costophrenic angle is new. Possible pneumonia or pulmonary infarct. No significant effusion. Right lung clear. IMPRESSION: Small area of airspace disease in the left lateral lung base. Possible pneumonia or pulmonary infarct. Electronically Signed   By: Franchot Gallo M.D.   On: 04/11/2020 14:59   ECHOCARDIOGRAM COMPLETE  Result Date: 04/12/2020    ECHOCARDIOGRAM REPORT   Patient Name:   Ryan Garcia Date of Exam: 04/12/2020 Medical Rec #:  [id number]     Height:       71.0 in Accession #:    [id number]    Weight:       250.0 lb Date of Birth:  1933-12-14     BSA:          2.318 m Patient Age:    40 years      BP:           118/58 mmHg Patient Gender: M             HR:           73 bpm. Exam Location:  Inpatient Procedure: 2D Echo, Cardiac Doppler, Color Doppler and Intracardiac            Opacification Agent Indications:    Chest pain  History:        Patient has prior history of Echocardiogram examinations, most                 recent 06/19/2017. CAD and Previous Myocardial Infarction; Risk                 Factors:Hypertension, Sleep Apnea, Dyslipidemia and Diabetes.  Sonographer:  Clayton Lefort RDCS (AE) Referring Phys: [id number] [place]  Sonographer Comments: Suboptimal apical window, suboptimal parasternal window and patient is morbidly obese. IMPRESSIONS  1. Left ventricular ejection fraction, by estimation, is 60 to 65%. The left ventricle has normal function. The left ventricle demonstrates regional wall motion abnormalities (see scoring diagram/findings for description). Left ventricular diastolic parameters are consistent with Grade I diastolic dysfunction (impaired relaxation).  Elevated left atrial pressure. There is mild hypokinesis of the left ventricular, basal inferior wall.  2. Right ventricular systolic function is normal. The right ventricular size is normal.  3. Left atrial size was mildly dilated.  4. Right atrial size was mildly dilated.  5. The mitral valve is normal in structure. No evidence of mitral valve regurgitation. No evidence of mitral stenosis.  6. The aortic valve is normal in structure. Aortic valve regurgitation is not visualized. Mild aortic valve sclerosis is present, with no evidence of aortic valve stenosis.  7. The inferior vena cava is normal in size with greater than 50% respiratory variability, suggesting right atrial pressure of 3 mmHg. FINDINGS  Left Ventricle: Left ventricular ejection fraction, by estimation, is 60 to 65%. The left ventricle has normal function. The left ventricle demonstrates regional wall motion abnormalities. Mild hypokinesis of the left ventricular, basal inferior wall. Definity contrast agent was given IV to delineate the left ventricular endocardial borders. The left ventricular internal cavity size was normal in size. There is no left ventricular hypertrophy. Left ventricular diastolic parameters are consistent with Grade I diastolic dysfunction (impaired relaxation). Elevated left atrial pressure. Right Ventricle: The right ventricular size is normal. No increase in right ventricular wall thickness. Right ventricular systolic function is normal. Left Atrium: Left atrial size was mildly dilated. Right Atrium: Right atrial size was mildly dilated. Pericardium: There is no evidence of pericardial effusion. Mitral Valve: The mitral valve is normal in structure. No evidence of mitral valve regurgitation. No evidence of mitral valve stenosis. Tricuspid Valve: The tricuspid valve is normal in structure. Tricuspid valve regurgitation is not demonstrated. No evidence of tricuspid stenosis. Aortic Valve: The aortic valve is normal in  structure. Aortic valve regurgitation is not visualized. Mild aortic valve sclerosis is present, with no evidence of aortic valve stenosis. Aortic valve mean gradient measures 6.0 mmHg. Aortic valve peak gradient measures 10.4 mmHg. Aortic valve area, by VTI measures 2.46 cm. Pulmonic Valve: The pulmonic valve was normal in structure. Pulmonic valve regurgitation is not visualized. No evidence of pulmonic stenosis. Aorta: The aortic root is normal in size and structure. Venous: The inferior vena cava is normal in size with greater than 50% respiratory variability, suggesting right atrial pressure of 3 mmHg. IAS/Shunts: No atrial level shunt detected by color flow Doppler.  LEFT VENTRICLE PLAX 2D LVIDd:         4.00 cm  Diastology LVIDs:         2.50 cm  LV e' medial:    3.81 cm/s LV PW:         1.60 cm  LV E/e' medial:  16.4 LV IVS:        1.90 cm  LV e' lateral:   4.13 cm/s LVOT diam:     1.90 cm  LV E/e' lateral: 15.1 LV SV:         74 LV SV Index:   32 LVOT Area:     2.84 cm  RIGHT VENTRICLE             IVC RV Basal diam:  3.90 cm     IVC diam: 0.80 cm RV Mid diam:    2.90 cm RV S prime:     17.40 cm/s TAPSE (M-mode): 2.6 cm LEFT ATRIUM             Index       RIGHT ATRIUM           Index LA diam:        3.50 cm 1.51 cm/m  RA Area:     22.70 cm LA Vol (A2C):   72.9 ml 31.45 ml/m RA Volume:   71.80 ml  30.97 ml/m LA Vol (A4C):   60.5 ml 26.10 ml/m LA Biplane Vol: 70.5 ml 30.41 ml/m  AORTIC VALVE AV Area (Vmax):    2.31 cm AV Area (Vmean):   2.19 cm AV Area (VTI):     2.46 cm AV Vmax:           161.00 cm/s AV Vmean:          114.667 cm/s AV VTI:            0.299 m AV Peak Grad:      10.4 mmHg AV Mean Grad:      6.0 mmHg LVOT Vmax:         131.33 cm/s LVOT Vmean:        88.600 cm/s LVOT VTI:          0.259 m LVOT/AV VTI ratio: 0.87  AORTA Ao Root diam: 3.70 cm Ao Asc diam:  3.60 cm MV E velocity: 62.43 cm/s                            SHUNTS                            Systemic VTI:  0.26 m                             Systemic Diam: 1.90 cm Dani Gobble Croitoru MD Electronically signed by Sanda Klein MD Signature Date/Time: 04/12/2020/10:36:31 AM    Final    Disposition   Pt is being discharged home today in good condition.  Follow-up Plans & Appointments     Follow-up Information     Lelon Perla, MD Follow up on 04/18/2020.   Specialty: Cardiology Why: at 10am for your follow up appt Contact information: [address] [place] [address] [phone]                   Discharge Medications   Allergies as of 04/12/2020       Reactions   Loratadine    REACTION: not effective   Simvastatin    REACTION: intolerant        Medication List     TAKE these medications    aspirin 81 MG tablet Take 81 mg by mouth daily.   cetirizine 10 MG tablet Commonly known as: ZYRTEC TAKE 1 TABLET EVERY DAY What changed:  when to take this reasons to take this   CO Q 10 PO Take 200 mg by mouth daily.   Droplet Pen Needles 31G X 6 MM Misc Generic drug: Insulin Pen Needle USE ONE TIME DAILY  AT  BEDTIME  WITH  LANTUS   Flonase 50 MCG/ACT nasal spray Generic drug: fluticasone Place 1 spray into both nostrils daily as  needed for allergies.   glipiZIDE 10 MG tablet Commonly known as: GLUCOTROL TAKE 1 TABLET TWICE DAILY WITH MEALS   hydrochlorothiazide 12.5 MG tablet Commonly known as: HYDRODIURIL TAKE 1 TABLET EVERY DAY   isosorbide mononitrate 30 MG 24 hr tablet Commonly known as: IMDUR Take 1 tablet (30 mg total) by mouth daily.   Lantus SoloStar 100 UNIT/ML Solostar Pen Generic drug: insulin glargine INJECT 60 UNITS SUBCUTANEOUSLY AT BEDTIME What changed:  how much to take how to take this when to take this additional instructions   lisinopril 20 MG tablet Commonly known as: ZESTRIL TAKE 1 TABLET EVERY DAY   metFORMIN 1000 MG tablet Commonly known as: GLUCOPHAGE TAKE 1 TABLET TWICE DAILY Notes to patient: Please do not restart until  10/15   metoprolol tartrate 25 MG tablet Commonly known as: LOPRESSOR Take 0.5 tablets (12.5 mg total) by mouth 2 (two) times daily.   nitroGLYCERIN 0.4 MG SL tablet Commonly known as: Nitrostat Place 1 tablet (0.4 mg total) under the tongue every 5 (five) minutes as needed for chest pain.   rosuvastatin 40 MG tablet Commonly known as: CRESTOR Take 40 mg by mouth daily.   True Metrix Air Glucose Meter w/Device Kit 1 kit by Other route 2 (two) times daily. Check blood sugar twice daily and as directed. Dx E11.65   True Metrix Blood Glucose Test test strip Generic drug: glucose blood CHECK BLOOD SUGAR TWICE DAILY   TRUEplus Lancets 30G Misc Check blood sugar twice daily and as directed. Dx E11.65   Vitamin B12 1000 MCG Tbcr Take 1,000 mcg by mouth daily. What changed:  medication strength how much to take        Outstanding Labs/Studies   N/a  Duration of Discharge Encounter   Greater than 30 minutes including physician time.  Signed, Reino Bellis, NP 04/12/2020, 2:33 PM   I have personally seen and examined this patient. I agree with the assessment and plan as outlined above.  Cardiac cath as outlined above with continued severe disease in the LAD and Circumflex. No good targets for PCI. Will discharge today and if he has recurrent refractory angina, will have to consider bypass.   Lauree Chandler 04/12/2020 2:58 PM

## 2020-04-12 NOTE — H&P (View-Only) (Signed)
Progress Note  Patient Name: Ryan Garcia Date of Encounter: 04/12/2020  [facility] Cardiologist: Kirk Ruths, MD   Subjective   No chest pain this am.   Inpatient Medications    Scheduled Meds: . aspirin  324 mg Oral NOW   Or  . aspirin  300 mg Rectal NOW  . aspirin EC  81 mg Oral Daily  . heparin  5,000 Units Subcutaneous Q8H  . insulin aspart  0-15 Units Subcutaneous TID WC  . insulin aspart  0-5 Units Subcutaneous QHS  . isosorbide mononitrate  30 mg Oral Daily  . metoprolol tartrate  12.5 mg Oral BID  . pravastatin  80 mg Oral Daily  . sodium chloride flush  3 mL Intravenous Q12H   Continuous Infusions: . sodium chloride     PRN Meds: sodium chloride, acetaminophen, ALPRAZolam, nitroGLYCERIN, ondansetron (ZOFRAN) IV, sodium chloride flush, zolpidem   Vital Signs    Vitals:   04/11/20 2230 04/12/20 0130 04/12/20 0500 04/12/20 0600  BP:   (!) 111/56 116/62  Pulse: 69 80 83 84  Resp: (!) 23 (!) 26 (!) 21 (!) 22  Temp:      TempSrc:      SpO2: 96% 96% 96% 95%  Weight:      Height:       No intake or output data in the 24 hours ending 04/12/20 0756 Last 3 Weights 04/11/2020 01/18/2020 11/18/2019  Weight (lbs) 250 lb 250 lb 9 oz 256 lb  Weight (kg) 113.399 kg 113.654 kg 116.121 kg      Telemetry    Sinus - Personally Reviewed  ECG    No AM tracing  Physical Exam   GEN: No acute distress.   Neck: No JVD Cardiac: RRR, no murmurs, rubs, or gallops.  Respiratory: Clear to auscultation bilaterally. GI: Soft, nontender, non-distended  MS: No edema; No deformity. Neuro:  Nonfocal  Psych: Normal affect   Labs    High Sensitivity Troponin:   Recent Labs  Lab 04/11/20 1437 04/11/20 1709  TROPONINIHS 8 7      Chemistry Recent Labs  Lab 04/11/20 1437 04/11/20 1804 04/12/20 0233  NA 140  --  137  K 4.4  --  4.4  CL 107  --  105  CO2 22  --  21*  GLUCOSE 210*  --  212*  BUN 28*  --  28*  CREATININE 1.59* 1.51* 1.45*  CALCIUM 8.7*   --  8.8*  PROT 6.1*  --   --   ALBUMIN 3.3*  --   --   AST 87*  --   --   ALT 46*  --   --   ALKPHOS 84  --   --   BILITOT 1.5*  --   --   GFRNONAA 39* 42* 44*  ANIONGAP 11  --  11     Hematology Recent Labs  Lab 04/11/20 1437 04/12/20 0233  WBC 9.9 7.2  RBC 4.04* 3.92*  HGB 12.2* 11.8*  HCT 38.8* 36.3*  MCV 96.0 92.6  MCH 30.2 30.1  MCHC 31.4 32.5  RDW 15.2 15.1  PLT 165 153    BNPNo results for input(s): BNP, PROBNP in the last 168 hours.   DDimer No results for input(s): DDIMER in the last 168 hours.   Radiology    DG Chest Portable 1 View  Result Date: 04/11/2020 CLINICAL DATA:  Chest pain short of breath EXAM: PORTABLE CHEST 1 VIEW COMPARISON:  07/03/2018 FINDINGS: Heart size  upper normal.  Normal vascularity. Mild airspace disease in the left costophrenic angle is new. Possible pneumonia or pulmonary infarct. No significant effusion. Right lung clear. IMPRESSION: Small area of airspace disease in the left lateral lung base. Possible pneumonia or pulmonary infarct. Electronically Signed   By: Franchot Gallo M.D.   On: 04/11/2020 14:59    Cardiac Studies     Patient Profile     86 y.o. male with history of severe three vessel CAD, prior stenting of the RCA, DM, HTN admitted with chest pain.   Assessment & Plan    1. CAD with unstable angina: His episode yesterday was consistent with cardiac pain. He has had progressive dyspnea and fatigue for several weeks. Troponin is negative. We have discussed proceeding with a cardiac cath today. He would like to proceed.  I have reviewed the risks, indications, and alternatives to cardiac catheterization, possible angioplasty, and stenting with the patient. Risks include but are not limited to bleeding, infection, vascular injury, stroke, myocardial infection, arrhythmia, kidney injury, radiation-related injury in the case of prolonged fluoroscopy use, emergency cardiac surgery, and death. The patient understands the risks  of serious complication is 1-2 in 4360 with diagnostic cardiac cath and 1-2% or less with angioplasty/stenting.  For questions or updates, please contact [place] Please consult [email or web address] for contact info under        Signed, Lauree Chandler, MD  04/12/2020, 7:56 AM   d

## 2020-04-12 NOTE — Discharge Instructions (Signed)
Drink plenty of fluids for 48 hours and keep wrist elevated at heart level for 24 hours Hold Metformin for 48 hours. May resume 10/14 pm.  Radial Site Care   This sheet gives you information about how to care for yourself after your procedure. Your health care provider may also give you more specific instructions. If you have problems or questions, contact your health care provider. What can I expect after the procedure? After the procedure, it is common to have:  Bruising and tenderness at the catheter insertion area. Follow these instructions at home: Medicines  Take over-the-counter and prescription medicines only as told by your health care provider. Insertion site care 1. Follow instructions from your health care provider about how to take care of your insertion site. Make sure you: ? Wash your hands with soap and water before you change your bandage (dressing). If soap and water are not available, use hand sanitizer. ? Remove your dressing as told by your health care provider. In 24 hours 2. Check your insertion site every day for signs of infection. Check for: ? Redness, swelling, or pain. ? Fluid or blood. ? Pus or a bad smell. ? Warmth. 3. Do not take baths, swim, or use a hot tub until your health care provider approves. 4. You may shower 24-48 hours after the procedure, or as directed by your health care provider. ? Remove the dressing and gently wash the site with plain soap and water. ? Pat the area dry with a clean towel. ? Do not rub the site. That could cause bleeding. 5. Do not apply powder or lotion to the site. Activity   1. For 24 hours after the procedure, or as directed by your health care provider: ? Do not flex or bend the affected arm. ? Do not push or pull heavy objects with the affected arm. ? Do not drive yourself home from the hospital or clinic. You may drive 24 hours after the procedure unless your health care provider tells you not to. ? Do not  operate machinery or power tools. 2. Do not lift anything that is heavier than 10 lb (4.5 kg), or the limit that you are told, until your health care provider says that it is safe.  For 4 days 3. Ask your health care provider when it is okay to: ? Return to work or school. ? Resume usual physical activities or sports. ? Resume sexual activity. General instructions  If the catheter site starts to bleed, raise your arm and put firm pressure on the site. If the bleeding does not stop, get help right away. This is a medical emergency.  If you went home on the same day as your procedure, a responsible adult should be with you for the first 24 hours after you arrive home.  Keep all follow-up visits as told by your health care provider. This is important. Contact a health care provider if:  You have a fever.  You have redness, swelling, or yellow drainage around your insertion site. Get help right away if:  You have unusual pain at the radial site.  The catheter insertion area swells very fast.  The insertion area is bleeding, and the bleeding does not stop when you hold steady pressure on the area.  Your arm or hand becomes pale, cool, tingly, or numb. These symptoms may represent a serious problem that is an emergency. Do not wait to see if the symptoms will go away. Get medical help right away. Call  your local emergency services (911 in the [place] Do not drive yourself to the hospital. Summary  After the procedure, it is common to have bruising and tenderness at the site.  Follow instructions from your health care provider about how to take care of your radial site wound. Check the wound every day for signs of infection.  Do not lift anything that is heavier than 10 lb (4.5 kg), or the limit that you are told, until your health care provider says that it is safe. This information is not intended to replace advice given to you by your health care provider. Make sure you discuss any  questions you have with your health care provider. Document Revised: 07/24/2017 Document Reviewed: 07/24/2017 Elsevier Patient Education  2020 [employer].

## 2020-04-12 NOTE — Progress Notes (Signed)
  Echocardiogram 2D Echocardiogram has been performed with Definity.  Ryan Garcia 04/12/2020, 10:15 AM

## 2020-04-12 NOTE — Progress Notes (Signed)
HPI: FU CAD.Cardiac catheterization in the past has revealed significant three-vessel coronary artery disease. I reviewed those films with Dr. Percival Spanish and Dr. Lia Foyer and we felt medical therapy best option (pt also wanted to avoid CABG).Patient admitted December 2018 and ruled in for non-ST elevation myocardial infarction. Patient underwent repeat catheterization revealing severe three-vessel coronary disease. Left-sided disease was unchanged compared to previous catheterizations and the culprit was felt to be a new RCA lesion and he had PCI with DES. Recently discharged following admission for chest pain.  Echocardiogram October 2021 showed normal LV function, grade 1 diastolic dysfunction, mild biatrial enlargement.  Cardiac catheterization October 2021 showed 85% proximal LAD, 85% 1st diagonal, 75% mid LAD, 65% distal LAD, 80% circumflex, 95% 1st marginal, 75% 2nd marginal, 60% PDA, 70% posterior lateral, patent stent in the right coronary artery.  There were no targets for PCI.  Medical therapy recommended and can consider coronary artery bypass graft if refractory angina.  Readmitted with RUQ pain. Abdominal MRI October 2021 showed cholelithiasis, 6 cm mass upper pole of right kidney consistent with renal neoplasm.  Gastroenterology evaluated the patient and felt he may have passed a stone but no other therapy recommended as his symptoms improved.  General surgery also did not plan surgery given cardiac history.  Since patient was discharged he denies dyspnea, chest pain, palpitations or syncope. No recurrent abdominal pain. Fevers have resolved.  Current Outpatient Medications  Medication Sig Dispense Refill  . acetaminophen (TYLENOL) 325 MG tablet Take 650 mg by mouth every 6 (six) hours as needed for mild pain or headache.     Marland Kitchen amoxicillin-clavulanate (AUGMENTIN) 875-125 MG tablet Take 1 tablet by mouth every 12 (twelve) hours for 7 days. 14 tablet 0  . aspirin 81 MG tablet Take 81  mg by mouth daily.      . Blood Glucose Monitoring Suppl (TRUE METRIX AIR GLUCOSE METER) W/DEVICE KIT 1 kit by Other route 2 (two) times daily. Check blood sugar twice daily and as directed. Dx E11.65 1 kit 0  . cetirizine (ZYRTEC) 10 MG tablet TAKE 1 TABLET EVERY DAY (Patient taking differently: Take 10 mg by mouth daily as needed for allergies. ) 90 tablet 1  . Coenzyme Q10 (CO Q 10 PO) Take 200 mg by mouth daily.    . Cyanocobalamin (VITAMIN B12) 1000 MCG TBCR Take 1,000 mcg by mouth daily. 30 tablet   . DROPLET PEN NEEDLES 31G X 6 MM MISC USE ONE TIME DAILY  AT  BEDTIME  WITH  LANTUS 100 each 0  . fluticasone (FLONASE) 50 MCG/ACT nasal spray Place 1 spray into both nostrils daily as needed for allergies.     Marland Kitchen glipiZIDE (GLUCOTROL) 10 MG tablet TAKE 1 TABLET TWICE DAILY WITH MEALS (Patient taking differently: Take 10 mg by mouth 2 (two) times daily before a meal. ) 180 tablet 1  . insulin glargine (LANTUS SOLOSTAR) 100 UNIT/ML Solostar Pen INJECT 60 UNITS SUBCUTANEOUSLY AT BEDTIME (Patient taking differently: Inject 50 Units into the skin at bedtime. ) 50 mL 3  . isosorbide mononitrate (IMDUR) 30 MG 24 hr tablet Take 1 tablet (30 mg total) by mouth daily. 90 tablet 3  . metFORMIN (GLUCOPHAGE) 1000 MG tablet TAKE 1 TABLET TWICE DAILY (Patient taking differently: Take 1,000 mg by mouth 2 (two) times daily with a meal. ) 180 tablet 1  . metoprolol tartrate (LOPRESSOR) 25 MG tablet Take 1 tablet (25 mg total) by mouth 2 (two) times daily. 60 tablet  0  . nitroGLYCERIN (NITROSTAT) 0.4 MG SL tablet Place 1 tablet (0.4 mg total) under the tongue every 5 (five) minutes as needed for chest pain. (Patient taking differently: Place 0.4 mg under the tongue every 5 (five) minutes as needed for chest pain (max 3 doses). ) 25 tablet 3  . rosuvastatin (CRESTOR) 40 MG tablet Take 40 mg by mouth daily.     Marland Kitchen saccharomyces boulardii (FLORASTOR) 250 MG capsule Take 1 capsule (250 mg total) by mouth 2 (two) times daily  for 14 days. 28 capsule 0  . TRUE METRIX BLOOD GLUCOSE TEST test strip CHECK BLOOD SUGAR TWICE DAILY 200 strip 2  . TRUEPLUS LANCETS 30G MISC Check blood sugar twice daily and as directed. Dx E11.65 200 each 1   No current facility-administered medications for this visit.     Past Medical History:  Diagnosis Date  . AC (acromioclavicular) joint bone spurs    lt ankle  . Allergy    allergic rhinitis  . Anginal pain ([place])   . Arthritis    OA  . BPH (benign prostatic hyperplasia)    microwave tx of prostate  . CAD (coronary artery disease)    a. s/p cath in 2012 showing 70-75% LAD stenosis, 75% D1 stenosis, 75% OM1, 60-70% RCA stenosis, and 80% PDA --> medical therapy pursued b. similar findings by cath in 2016; 06/2017 DES to distal RCA  . Chronic upper back pain   . Diabetes mellitus    type II x8 years  . GERD (gastroesophageal reflux disease)   . HLD (hyperlipidemia)    10/12: TC 208, TG 213, HDL 36, LDL 129  . Hx of skin cancer, basal cell    many skin cancers removed/under constant treatment.  . Hypertension   . NSTEMI (non-ST elevated myocardial infarction) ([place]) 06/18/2017   DES to distal RCA  . Obstructive sleep apnea on CPAP   . Scoliosis     Past Surgical History:  Procedure Laterality Date  . BREAST SURGERY  2009   breast lump removed benign  . CARDIAC CATHETERIZATION N/A 03/15/2015   Procedure: Left Heart Cath and Coronary Angiography;  Surgeon: Belva Crome, MD;  Location: [facility];  Service: Cardiovascular;  Laterality: N/A;  . CORONARY STENT INTERVENTION N/A 06/18/2017   Procedure: CORONARY STENT INTERVENTION;  Surgeon: Jettie Booze, MD;  Location: [facility];  Service: Cardiovascular;  Laterality: N/A;  RCA  . LEFT HEART CATH AND CORONARY ANGIOGRAPHY N/A 06/18/2017   Procedure: LEFT HEART CATH AND CORONARY ANGIOGRAPHY;  Surgeon: Jettie Booze, MD;  Location: [facility];  Service: Cardiovascular;  Laterality: N/A;  .  LEFT HEART CATH AND CORONARY ANGIOGRAPHY N/A 04/12/2020   Procedure: LEFT HEART CATH AND CORONARY ANGIOGRAPHY;  Surgeon: Martinique, Peter M, MD;  Location: [facility];  Service: Cardiovascular;  Laterality: N/A;  . ULTRASOUND GUIDANCE FOR VASCULAR ACCESS  06/18/2017   Procedure: Ultrasound Guidance For Vascular Access;  Surgeon: Jettie Booze, MD;  Location: [facility];  Service: Cardiovascular;;  right radial, right femoral    Social History   Socioeconomic History  . Marital status: Married    Spouse name: Not on file  . Number of children: Not on file  . Years of education: Not on file  . Highest education level: Not on file  Occupational History  . Not on file  Tobacco Use  . Smoking status: Never Smoker  . Smokeless tobacco: Never Used  Vaping Use  . Vaping Use: Never used  Substance and Sexual Activity  . Alcohol use: No    Alcohol/week: 0.0 standard drinks  . Drug use: No  . Sexual activity: Not Currently  Other Topics Concern  . Not on file  Social History Narrative   Married    Physically active hard of hearing   Social Determinants of Health   Financial Resource Strain:   . Difficulty of Paying Living Expenses: Not on file  Food Insecurity:   . Worried About Charity fundraiser in the Last Year: Not on file  . Ran Out of Food in the Last Year: Not on file  Transportation Needs:   . Lack of Transportation (Medical): Not on file  . Lack of Transportation (Non-Medical): Not on file  Physical Activity:   . Days of Exercise per Week: Not on file  . Minutes of Exercise per Session: Not on file  Stress:   . Feeling of Stress : Not on file  Social Connections:   . Frequency of Communication with Friends and Family: Not on file  . Frequency of Social Gatherings with Friends and Family: Not on file  . Attends Religious Services: Not on file  . Active Member of Clubs or Organizations: Not on file  . Attends Archivist Meetings: Not on  file  . Marital Status: Not on file  Intimate Partner Violence:   . Fear of Current or Ex-Partner: Not on file  . Emotionally Abused: Not on file  . Physically Abused: Not on file  . Sexually Abused: Not on file    Family History  Problem Relation Age of Onset  . Heart attack Brother   . Colon cancer Neg Hx     ROS: no fevers or chills, productive cough, hemoptysis, dysphasia, odynophagia, melena, hematochezia, dysuria, hematuria, rash, seizure activity, orthopnea, PND, pedal edema, claudication. Remaining systems are negative.  Physical Exam: Well-developed well-nourished in no acute distress.  Skin is warm and dry.  HEENT is normal.  Neck is supple.  Chest is clear to auscultation with normal expansion.  Cardiovascular exam is regular rate and rhythm.  Abdominal exam nontender or distended. No masses palpated. Extremities show no edema. neuro grossly intact   A/P  1 coronary artery disease-patient has not had recurrent chest pain since recent admission.  Plan will be medical therapy as he would like to avoid coronary artery bypass graft.  Continue aspirin and statin. Continue metoprolol 25 mg twice daily. Note he had some fatigue with this in the past and he will contact us if this recurs. Increase isosorbide to 60 mg daily.  2 hypertension-patient's blood pressure is controlled. His lisinopril HCT was discontinued in the hospital. If blood pressure increases will add amlodipine.  3 hyperlipidemia-continue statin.  4 dyspnea-this is felt to be multifactorial including obesity hypoventilation syndrome and deconditioning.  5 renal mass-will need follow-up with urology. He will be considered high risk for any surgery given severity of coronary disease. Hopefully this may be ablated.  6 probable recent gallstones-patient improved.  Follow-up primary care.  Kirk Ruths, MD

## 2020-04-12 NOTE — ED Notes (Signed)
Echo at bedside

## 2020-04-12 NOTE — Telephone Encounter (Signed)
   Patient's wife called the after hours line to report that the patient had a fever. He is s/p cardiac cath today for the evaluation of chest pain, though no intervention performed as anatomy was unchanged from prior and he had no suitable targets for PCI. She states he felt chills start prior to leaving the hospital, though did not say anything to staff. On arrival home he went and laid down for a nap. Upon waking she noticed he felt warm. She reports fever of 101.31F. He has no complaints of chest pain, SOB, cough, congestion, dysuria, or GI complaints. No complaints of redness/irriation at the cath site. Influenza and COVID-19 tests were negative yesterday. Unclear etiology of his fever - ?stress response to his procedure today. Encouraged her to give tylenol as directed on the packaging for fever reduction. Suspect this will declare itself in the coming 24 hours. Encouraged her to reach out to his PCP office if he continues to have fevers in the morning. We discussed low threshold to present to the ED for worsening symptoms. She was in agreement with the plan and appreciative of the call.   Abigail Butts, PA-C 04/12/20; 8:18 PM

## 2020-04-12 NOTE — ED Notes (Signed)
Pt has eaten his breakfast, will instruct on NPO for procedure.

## 2020-04-12 NOTE — Interval H&P Note (Signed)
History and Physical Interval Note:  04/12/2020 11:27 AM  Ryan Garcia  has presented today for surgery, with the diagnosis of unstable angina.  The various methods of treatment have been discussed with the patient and family. After consideration of risks, benefits and other options for treatment, the patient has consented to  Procedure(s): LEFT HEART CATH AND CORONARY ANGIOGRAPHY (N/A) as a surgical intervention.  The patient's history has been reviewed, patient examined, no change in status, stable for surgery.  I have reviewed the patient's chart and labs.  Questions were answered to the patient's satisfaction.   Cath Lab Visit (complete for each Cath Lab visit)  Clinical Evaluation Leading to the Procedure:   ACS: Yes.    Non-ACS:    Anginal Classification: CCS IV  Anti-ischemic medical therapy: Maximal Therapy (2 or more classes of medications)  Non-Invasive Test Results: No non-invasive testing performed  Prior CABG: No previous CABG        Collier Salina [facility] 04/12/2020 11:27 AM

## 2020-04-13 ENCOUNTER — Telehealth: Payer: Self-pay

## 2020-04-13 ENCOUNTER — Encounter (HOSPITAL_COMMUNITY): Payer: Self-pay | Admitting: Cardiology

## 2020-04-13 ENCOUNTER — Telehealth: Payer: Self-pay | Admitting: Cardiology

## 2020-04-13 ENCOUNTER — Encounter: Payer: Self-pay | Admitting: Cardiology

## 2020-04-13 NOTE — Telephone Encounter (Signed)
This encounter was created in error - please disregard.

## 2020-04-13 NOTE — Telephone Encounter (Signed)
I reviewed recommendation from cardiology.   Please schedule virtual visit when able. If he is unable to do that (feeling too bad) then I advise UC or ER visit for evaluation.   May consider getting covid tested but I'm sure getting him out is difficult  If worse overnight-ER as well

## 2020-04-13 NOTE — Telephone Encounter (Signed)
I saw the call placed to cardiology  Low threshold to take to ER or UC  I routed this back to myself and will watch his chart

## 2020-04-13 NOTE — Telephone Encounter (Signed)
Follow Up:     Pt had a Cath yesterday. He is still running a 102 fever and he have no energy. He will not eat,just laying around. She called last night and talked to PA on call. See note from 04-12-20,

## 2020-04-13 NOTE — Telephone Encounter (Signed)
Pt c/o Shortness Of Breath: STAT if SOB developed within the last 24 hours or pt is noticeably SOB on the phone  1. Are you currently SOB (can you hear that pt is SOB on the phone)? Yes  2. How long have you been experiencing SOB? Yesterday  3. Are you SOB when sitting or when up moving around? Moving around  4. Are you currently experiencing any other symptoms? 102 degree fever, sleeping a lot, very tired, droopy eye

## 2020-04-13 NOTE — Telephone Encounter (Signed)
Ryan Garcia calling pts temp now is 102; pt had tylenol at 5 AM this morning; pt is lifeless, not steady on feet and very fatigued. No CP or SOB,H/A or dizziness. Pt had heart cath on 04/12/20. Pt was having chills while at hospital per Ryan Garcia. Pt cannot get up and down by himself. No available appts or workins at [facility] today and UC & ED precautions given to pts wife and she voiced understanding. Pt s wife said card advised her if we could not see pt this morning to call card back. Ryan Garcia will call cardiology now for further instructions. FYI to Dr Glori Bickers.

## 2020-04-13 NOTE — Telephone Encounter (Signed)
Pts wife called to report that the pt had came home about 4 pm yesterday after having his heart cath... he went to lay down and had chills and his wife checked his temp and it was then 102.8.... he is very tired and his right eye is "closed" the phone message says 'droopy' but she denies it is drooping just a little swollen.... no facial drooping. No slurred speech, no one sided weakness and no headache..... he has no appetite.   I spoke with Dr. Harrell Gave the DOD and she says her to continue to monitor the pt and to continue the Tylenol... she will call Dr. Alba Cory office back to see if she can get an appt with her.  She will make sure he is drinking some fluids and will call back if anything worsens or changes.   Pt will keep his f/u with Dr. Stanford Breed 04/18/20.

## 2020-04-14 ENCOUNTER — Inpatient Hospital Stay (HOSPITAL_COMMUNITY): Payer: Medicare HMO

## 2020-04-14 ENCOUNTER — Emergency Department (HOSPITAL_COMMUNITY): Payer: Medicare HMO

## 2020-04-14 ENCOUNTER — Ambulatory Visit
Admission: EM | Admit: 2020-04-14 | Discharge: 2020-04-14 | Disposition: A | Discharge status: Home / Self Care | Payer: Medicare HMO

## 2020-04-14 ENCOUNTER — Inpatient Hospital Stay (HOSPITAL_COMMUNITY)
Admission: EM | Admit: 2020-04-14 | Discharge: 2020-04-17 | DRG: 445 | Disposition: A | Discharge status: Home / Self Care | Payer: Medicare HMO | Attending: Internal Medicine | Admitting: Internal Medicine

## 2020-04-14 ENCOUNTER — Encounter (HOSPITAL_COMMUNITY): Payer: Self-pay

## 2020-04-14 DIAGNOSIS — Z86010 Personal history of colonic polyps: Secondary | ICD-10-CM

## 2020-04-14 DIAGNOSIS — Z79899 Other long term (current) drug therapy: Secondary | ICD-10-CM | POA: Diagnosis not present

## 2020-04-14 DIAGNOSIS — K8309 Other cholangitis: Secondary | ICD-10-CM | POA: Diagnosis not present

## 2020-04-14 DIAGNOSIS — R0789 Other chest pain: Secondary | ICD-10-CM | POA: Diagnosis not present

## 2020-04-14 DIAGNOSIS — I2584 Coronary atherosclerosis due to calcified coronary lesion: Secondary | ICD-10-CM | POA: Diagnosis present

## 2020-04-14 DIAGNOSIS — Z6834 Body mass index (BMI) 34.0-34.9, adult: Secondary | ICD-10-CM

## 2020-04-14 DIAGNOSIS — I25110 Atherosclerotic heart disease of native coronary artery with unstable angina pectoris: Secondary | ICD-10-CM | POA: Diagnosis not present

## 2020-04-14 DIAGNOSIS — K8020 Calculus of gallbladder without cholecystitis without obstruction: Secondary | ICD-10-CM | POA: Diagnosis not present

## 2020-04-14 DIAGNOSIS — E669 Obesity, unspecified: Secondary | ICD-10-CM | POA: Diagnosis present

## 2020-04-14 DIAGNOSIS — R0602 Shortness of breath: Secondary | ICD-10-CM | POA: Diagnosis not present

## 2020-04-14 DIAGNOSIS — Z01818 Encounter for other preprocedural examination: Secondary | ICD-10-CM | POA: Diagnosis not present

## 2020-04-14 DIAGNOSIS — E861 Hypovolemia: Secondary | ICD-10-CM | POA: Diagnosis present

## 2020-04-14 DIAGNOSIS — N2889 Other specified disorders of kidney and ureter: Secondary | ICD-10-CM | POA: Diagnosis present

## 2020-04-14 DIAGNOSIS — Z85828 Personal history of other malignant neoplasm of skin: Secondary | ICD-10-CM | POA: Diagnosis not present

## 2020-04-14 DIAGNOSIS — I252 Old myocardial infarction: Secondary | ICD-10-CM

## 2020-04-14 DIAGNOSIS — I10 Essential (primary) hypertension: Secondary | ICD-10-CM | POA: Diagnosis present

## 2020-04-14 DIAGNOSIS — K5732 Diverticulitis of large intestine without perforation or abscess without bleeding: Secondary | ICD-10-CM | POA: Diagnosis not present

## 2020-04-14 DIAGNOSIS — E785 Hyperlipidemia, unspecified: Secondary | ICD-10-CM | POA: Diagnosis present

## 2020-04-14 DIAGNOSIS — N400 Enlarged prostate without lower urinary tract symptoms: Secondary | ICD-10-CM | POA: Diagnosis present

## 2020-04-14 DIAGNOSIS — J9811 Atelectasis: Secondary | ICD-10-CM | POA: Diagnosis not present

## 2020-04-14 DIAGNOSIS — Z7982 Long term (current) use of aspirin: Secondary | ICD-10-CM

## 2020-04-14 DIAGNOSIS — K8070 Calculus of gallbladder and bile duct without cholecystitis without obstruction: Secondary | ICD-10-CM | POA: Diagnosis present

## 2020-04-14 DIAGNOSIS — E1165 Type 2 diabetes mellitus with hyperglycemia: Secondary | ICD-10-CM | POA: Diagnosis not present

## 2020-04-14 DIAGNOSIS — E662 Morbid (severe) obesity with alveolar hypoventilation: Secondary | ICD-10-CM | POA: Diagnosis present

## 2020-04-14 DIAGNOSIS — E038 Other specified hypothyroidism: Secondary | ICD-10-CM | POA: Diagnosis present

## 2020-04-14 DIAGNOSIS — N189 Chronic kidney disease, unspecified: Secondary | ICD-10-CM | POA: Diagnosis present

## 2020-04-14 DIAGNOSIS — D696 Thrombocytopenia, unspecified: Secondary | ICD-10-CM | POA: Diagnosis present

## 2020-04-14 DIAGNOSIS — K7689 Other specified diseases of liver: Secondary | ICD-10-CM | POA: Diagnosis not present

## 2020-04-14 DIAGNOSIS — K8032 Calculus of bile duct with acute cholangitis without obstruction: Secondary | ICD-10-CM | POA: Diagnosis not present

## 2020-04-14 DIAGNOSIS — R000 Tachycardia, unspecified: Secondary | ICD-10-CM

## 2020-04-14 DIAGNOSIS — K2980 Duodenitis without bleeding: Secondary | ICD-10-CM | POA: Diagnosis present

## 2020-04-14 DIAGNOSIS — I2510 Atherosclerotic heart disease of native coronary artery without angina pectoris: Secondary | ICD-10-CM | POA: Diagnosis present

## 2020-04-14 DIAGNOSIS — I129 Hypertensive chronic kidney disease with stage 1 through stage 4 chronic kidney disease, or unspecified chronic kidney disease: Secondary | ICD-10-CM | POA: Diagnosis present

## 2020-04-14 DIAGNOSIS — Z8249 Family history of ischemic heart disease and other diseases of the circulatory system: Secondary | ICD-10-CM | POA: Diagnosis not present

## 2020-04-14 DIAGNOSIS — Z794 Long term (current) use of insulin: Secondary | ICD-10-CM

## 2020-04-14 DIAGNOSIS — K8030 Calculus of bile duct with cholangitis, unspecified, without obstruction: Secondary | ICD-10-CM | POA: Diagnosis not present

## 2020-04-14 DIAGNOSIS — K219 Gastro-esophageal reflux disease without esophagitis: Secondary | ICD-10-CM | POA: Diagnosis present

## 2020-04-14 DIAGNOSIS — K810 Acute cholecystitis: Secondary | ICD-10-CM

## 2020-04-14 DIAGNOSIS — D649 Anemia, unspecified: Secondary | ICD-10-CM | POA: Diagnosis present

## 2020-04-14 DIAGNOSIS — I4891 Unspecified atrial fibrillation: Secondary | ICD-10-CM | POA: Diagnosis present

## 2020-04-14 DIAGNOSIS — E1169 Type 2 diabetes mellitus with other specified complication: Secondary | ICD-10-CM | POA: Diagnosis present

## 2020-04-14 DIAGNOSIS — R509 Fever, unspecified: Secondary | ICD-10-CM

## 2020-04-14 DIAGNOSIS — Z7984 Long term (current) use of oral hypoglycemic drugs: Secondary | ICD-10-CM | POA: Diagnosis not present

## 2020-04-14 DIAGNOSIS — N179 Acute kidney failure, unspecified: Secondary | ICD-10-CM

## 2020-04-14 DIAGNOSIS — Z20822 Contact with and (suspected) exposure to covid-19: Secondary | ICD-10-CM | POA: Diagnosis not present

## 2020-04-14 DIAGNOSIS — D1803 Hemangioma of intra-abdominal structures: Secondary | ICD-10-CM | POA: Diagnosis not present

## 2020-04-14 DIAGNOSIS — I25119 Atherosclerotic heart disease of native coronary artery with unspecified angina pectoris: Secondary | ICD-10-CM | POA: Diagnosis present

## 2020-04-14 DIAGNOSIS — N281 Cyst of kidney, acquired: Secondary | ICD-10-CM | POA: Diagnosis not present

## 2020-04-14 DIAGNOSIS — Z955 Presence of coronary angioplasty implant and graft: Secondary | ICD-10-CM

## 2020-04-14 DIAGNOSIS — D631 Anemia in chronic kidney disease: Secondary | ICD-10-CM | POA: Diagnosis present

## 2020-04-14 DIAGNOSIS — E1122 Type 2 diabetes mellitus with diabetic chronic kidney disease: Secondary | ICD-10-CM | POA: Diagnosis present

## 2020-04-14 DIAGNOSIS — E7800 Pure hypercholesterolemia, unspecified: Secondary | ICD-10-CM | POA: Diagnosis present

## 2020-04-14 HISTORY — DX: Angina pectoris, unspecified: I20.9

## 2020-04-14 LAB — HEPATIC FUNCTION PANEL
ALT: 322 U/L — ABNORMAL HIGH (ref 0–44)
AST: 131 U/L — ABNORMAL HIGH (ref 15–41)
Albumin: 3 g/dL — ABNORMAL LOW (ref 3.5–5.0)
Alkaline Phosphatase: 214 U/L — ABNORMAL HIGH (ref 38–126)
Bilirubin, Direct: 1 mg/dL — ABNORMAL HIGH (ref 0.0–0.2)
Indirect Bilirubin: 1.1 mg/dL — ABNORMAL HIGH (ref 0.3–0.9)
Total Bilirubin: 2.1 mg/dL — ABNORMAL HIGH (ref 0.3–1.2)
Total Protein: 6.2 g/dL — ABNORMAL LOW (ref 6.5–8.1)

## 2020-04-14 LAB — CBC
HCT: 40.3 % (ref 39.0–52.0)
Hemoglobin: 12.8 g/dL — ABNORMAL LOW (ref 13.0–17.0)
MCH: 29.8 pg (ref 26.0–34.0)
MCHC: 31.8 g/dL (ref 30.0–36.0)
MCV: 93.7 fL (ref 80.0–100.0)
Platelets: 144 10*3/uL — ABNORMAL LOW (ref 150–400)
RBC: 4.3 MIL/uL (ref 4.22–5.81)
RDW: 15.2 % (ref 11.5–15.5)
WBC: 5.5 10*3/uL (ref 4.0–10.5)
nRBC: 0 % (ref 0.0–0.2)

## 2020-04-14 LAB — BASIC METABOLIC PANEL
Anion gap: 10 (ref 5–15)
BUN: 27 mg/dL — ABNORMAL HIGH (ref 8–23)
CO2: 21 mmol/L — ABNORMAL LOW (ref 22–32)
Calcium: 8.6 mg/dL — ABNORMAL LOW (ref 8.9–10.3)
Chloride: 104 mmol/L (ref 98–111)
Creatinine, Ser: 1.91 mg/dL — ABNORMAL HIGH (ref 0.61–1.24)
GFR, Estimated: 31 mL/min — ABNORMAL LOW (ref >60)
Glucose, Bld: 351 mg/dL — ABNORMAL HIGH (ref 70–99)
Potassium: 3.9 mmol/L (ref 3.5–5.1)
Sodium: 135 mmol/L (ref 135–145)

## 2020-04-14 LAB — URINALYSIS, ROUTINE W REFLEX MICROSCOPIC
Bilirubin Urine: NEGATIVE
Glucose, UA: 150 mg/dL — AB
Ketones, ur: NEGATIVE mg/dL
Leukocytes,Ua: NEGATIVE
Nitrite: NEGATIVE
Protein, ur: NEGATIVE mg/dL
Specific Gravity, Urine: 1.021 (ref 1.005–1.030)
pH: 5 (ref 5.0–8.0)

## 2020-04-14 LAB — GLUCOSE, CAPILLARY: Glucose-Capillary: 177 mg/dL — ABNORMAL HIGH (ref 70–99)

## 2020-04-14 LAB — RESPIRATORY PANEL BY RT PCR (FLU A&B, COVID)
Influenza A by PCR: NEGATIVE
Influenza B by PCR: NEGATIVE
SARS Coronavirus 2 by RT PCR: NEGATIVE

## 2020-04-14 LAB — TROPONIN I (HIGH SENSITIVITY)
Troponin I (High Sensitivity): 14 ng/L (ref <18)
Troponin I (High Sensitivity): 13 ng/L (ref <18)

## 2020-04-14 LAB — LACTIC ACID, PLASMA: Lactic Acid, Venous: 1.3 mmol/L (ref 0.5–1.9)

## 2020-04-14 LAB — LIPASE, BLOOD: Lipase: 28 U/L (ref 11–51)

## 2020-04-14 IMAGING — CT CT ABD-PELV W/ CM
2 of 5 series · 15 of 46 positions shown, 17 images · IV contrast (APPLIED)
Comparison: None.

CLINICAL DATA: Shortness of breath with fevers and abdominal pain

EXAM:
CT ANGIOGRAPHY CHEST
CT ABDOMEN AND PELVIS WITH CONTRAST
TECHNIQUE: Multidetector CT imaging of the chest was performed using the
standard protocol during bolus administration of intravenous
contrast. Multiplanar CT image reconstructions and MIPs were
obtained to evaluate the vascular anatomy. Multidetector CT imaging
of the abdomen and pelvis was performed using the standard protocol
during bolus administration of intravenous contrast.
CONTRAST:  70mL OMNIPAQUE IOHEXOL 350 MG/ML SOLN

[Series 5: abdomen 5.0 · axial · 0.90mm/px · z∈[+814,+1278]mm · 12 of 109 slices shown, 14 images]
[im 8/109  soft-tissue]
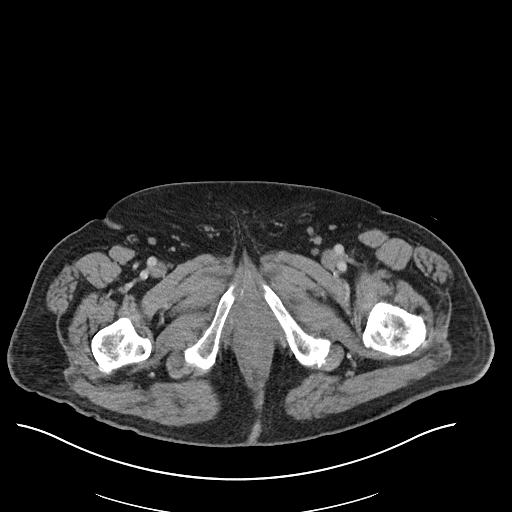
[im 8/109  bone]
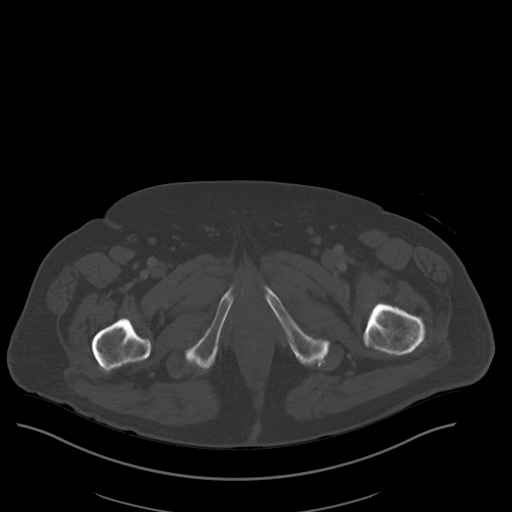
[im 16/109  soft-tissue]
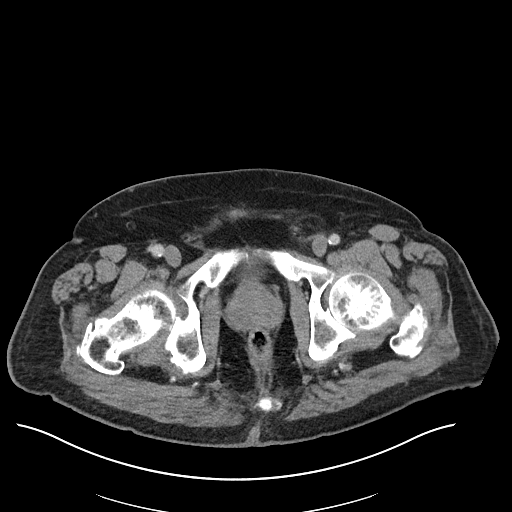
[im 24/109  soft-tissue]
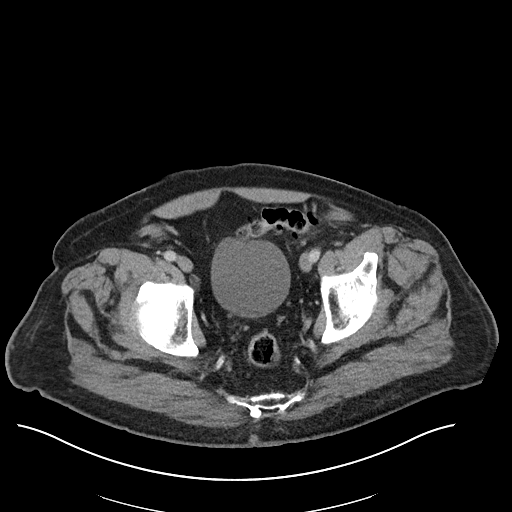
[im 31/109  soft-tissue]
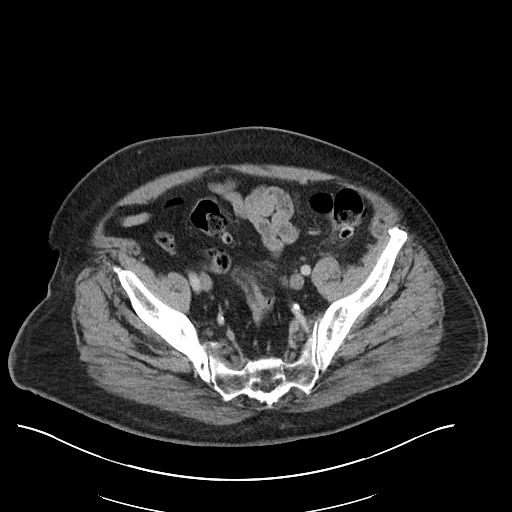
[im 39/109  soft-tissue]
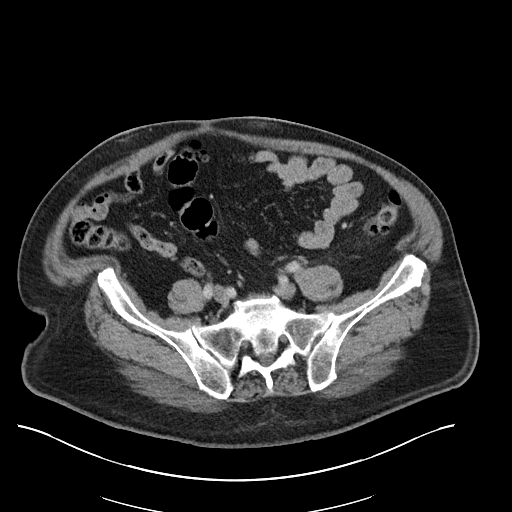
[im 47/109  soft-tissue]
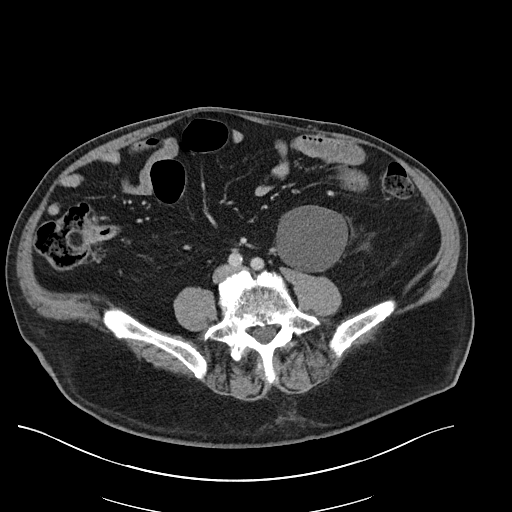
[im 62/109  soft-tissue]
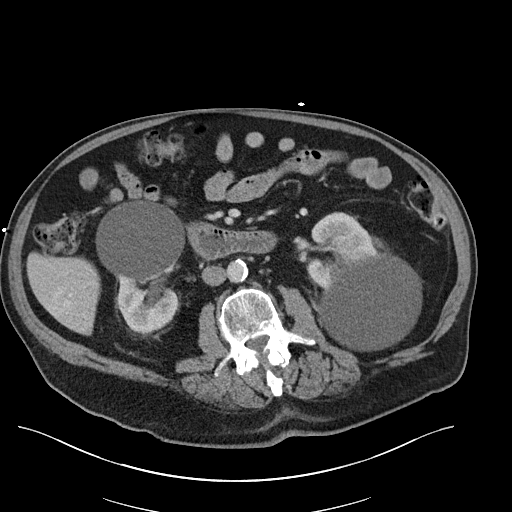
[im 70/109  soft-tissue]
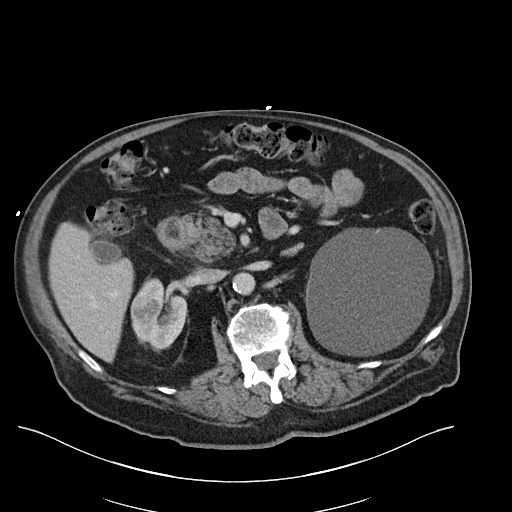
[im 78/109  soft-tissue]
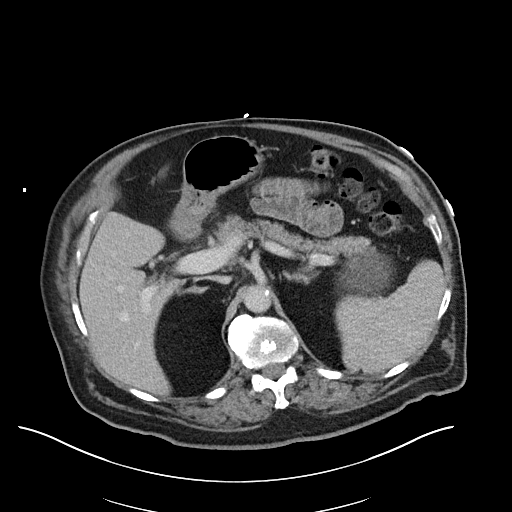
[im 78/109  bone]
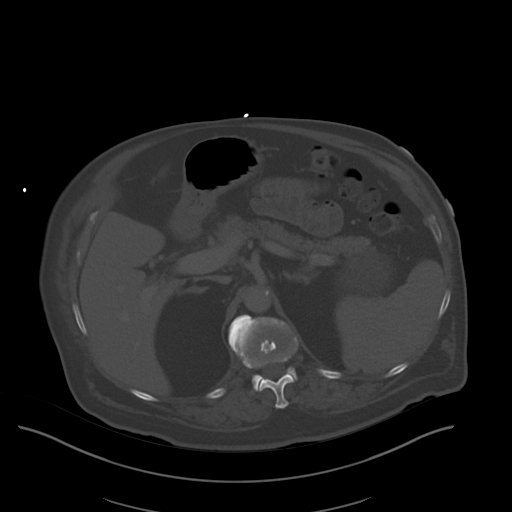
[im 85/109  soft-tissue]
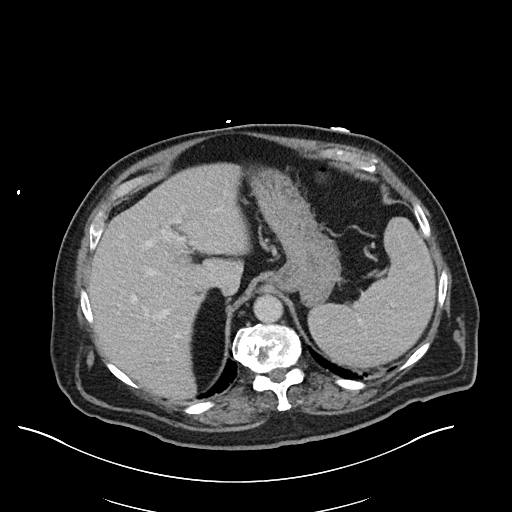
[im 93/109  soft-tissue]
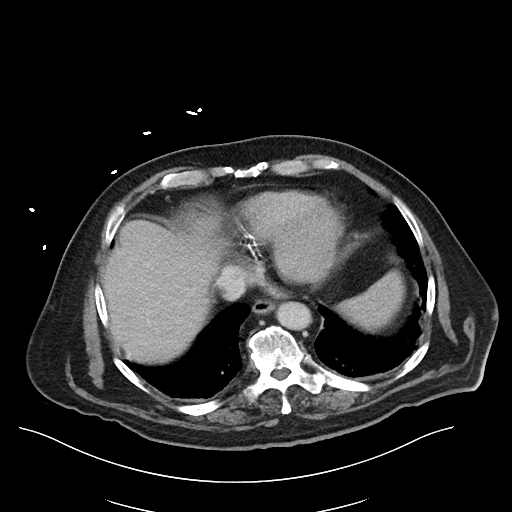
[im 101/109  soft-tissue]
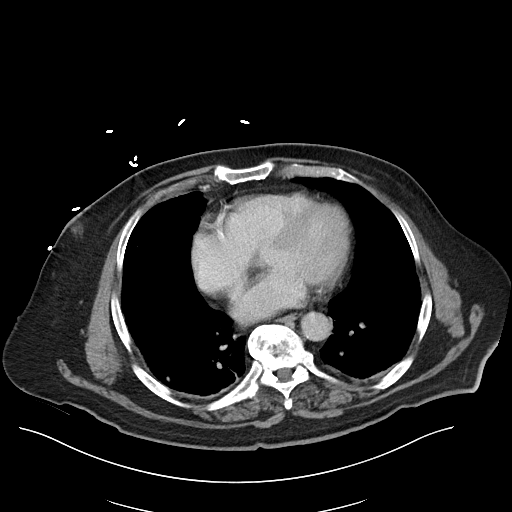

[Series 8: abdomen 3.0 mpr cor · coronal · 0.95mm/px · 3 of 113 slices shown]
[im 38/113  soft-tissue]
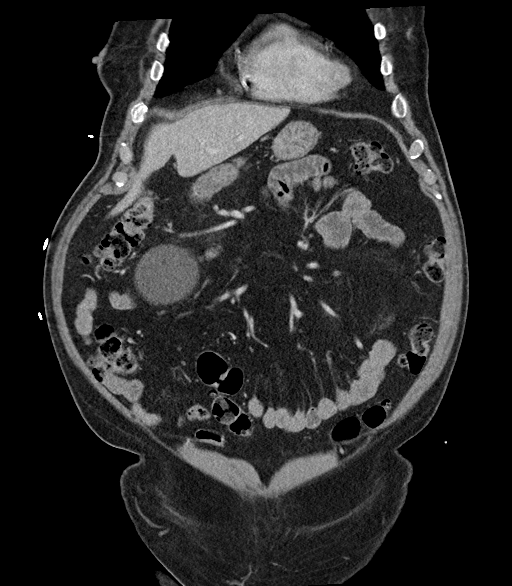
[im 50/113  soft-tissue]
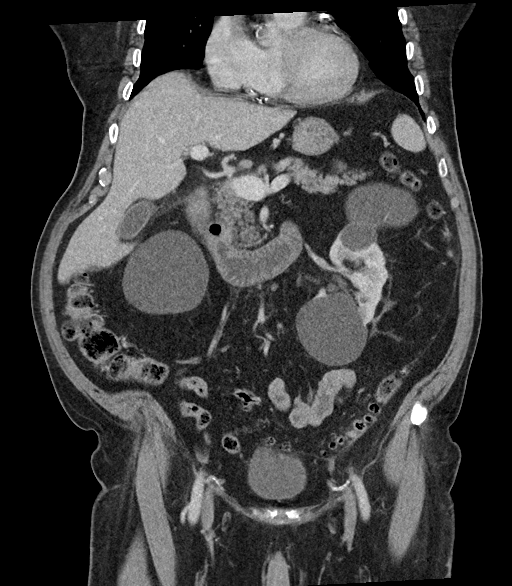
[im 63/113  soft-tissue]
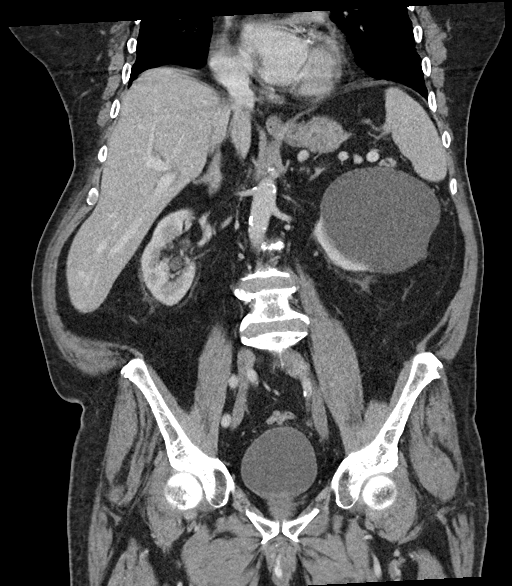

[15 of 46 positions shown; findings below may reference images not displayed]

FINDINGS: CTA CHEST FINDINGS

Cardiovascular: Thoracic aorta demonstrates mild atherosclerotic
calcifications. No aneurysmal dilatation is seen. No significant
opacification is noted. Coronary calcifications are seen. No cardiac
enlargement is noted. The pulmonary artery is well visualized with a
normal branching pattern bilaterally. No filling defect to suggest
pulmonary embolism is noted.

Mediastinum/Nodes: Thoracic inlet is within normal limits. No
sizable hilar or mediastinal adenopathy is noted. A few small
calcified nodes are seen consistent with prior granulomatous
disease. The esophagus is within normal limits.

Lungs/Pleura: Mild dependent atelectatic changes are noted
bilaterally. Some mosaic attenuation in the lungs is noted
consistent with air trapping. No focal confluent infiltrate or
sizable effusion is seen. No parenchymal nodules are noted.
Calcified granuloma is noted in the right lower lobe.

Musculoskeletal: Degenerative changes of the thoracic spine are
noted. No acute rib abnormality is noted.

Review of the MIP images confirms the above findings.

CT ABDOMEN and PELVIS FINDINGS

Hepatobiliary: Gallbladder is partially distended with a few small
dependent gallstones. No pericholecystic fluid is noted. The liver
shows fatty infiltration.

Pancreas: Unremarkable. No pancreatic ductal dilatation or
surrounding inflammatory changes.

Spleen: Normal in size without focal abnormality.

Adrenals/Urinary Tract: Adrenal glands are unremarkable bilaterally.
Kidneys are well visualized bilaterally with large bilateral renal
cysts. The largest of these on the right measures 8.5 cm. The
largest of these on the left measures 13 cm in greatest dimension.
Normal enhancement and excretion is seen. Bladder is partially
distended. Ureters are within normal limits. No renal calculi are
seen.

Stomach/Bowel: Scattered diverticular change of the colon is noted.
Very mild pericolonic inflammatory changes noted in the proximal
sigmoid colon consistent with early diverticulitis. No perforation
or abscess formation is noted. The appendix is within normal limits.
The remainder of the colon is unremarkable. Stomach is decompressed.
Small bowel is unremarkable.

Vascular/Lymphatic: Aortic atherosclerosis. No enlarged abdominal or
pelvic lymph nodes.

Reproductive: Prostate is unremarkable.

Other: No abdominal wall hernia or abnormality. No abdominopelvic
ascites.

Musculoskeletal: Degenerative changes of lumbar spine are noted. No
compression deformity is noted.

Review of the MIP images confirms the above findings.
IMPRESSION: CTA of the chest: No evidence of pulmonary emboli.

Mild dependent atelectatic changes.

Prior granulomatous disease.

Aortic Atherosclerosis ([71]-[71]).

CT of the abdomen and pelvis: Mild diverticulitis in the sigmoid
without evidence of perforation or abscess formation.

Cholelithiasis without complicating factors.

Bilateral renal cysts as described. No obstructive changes are
noted.

## 2020-04-14 IMAGING — US US ABDOMEN LIMITED
1 series · 14 of 25 positions shown · non-contrast
Comparison: CT abdomen and pelvis [DATE]

CLINICAL DATA: Cholelithiasis.

EXAM:
ULTRASOUND ABDOMEN LIMITED RIGHT UPPER QUADRANT

[Series 1: us abdomen limited · 14 of 37 slices shown]
[im 1/37]
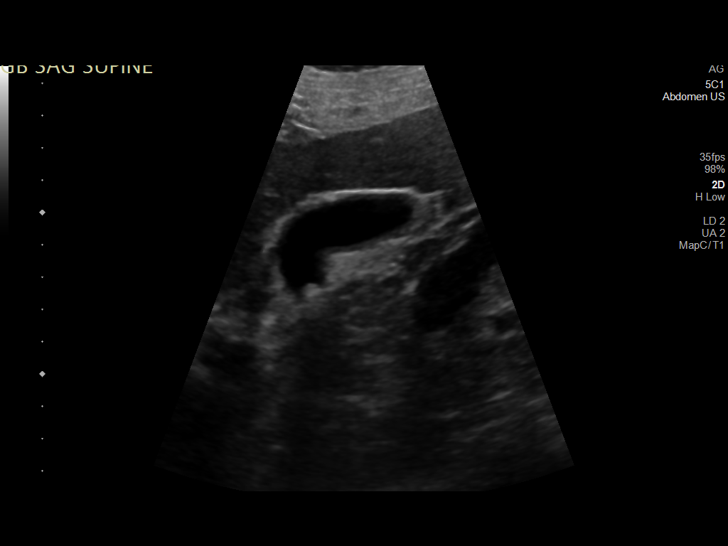
[im 4/37]
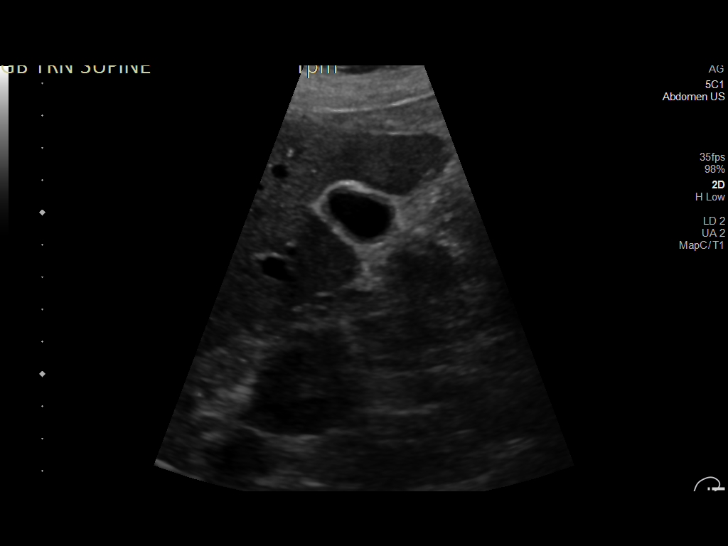
[im 7/37]
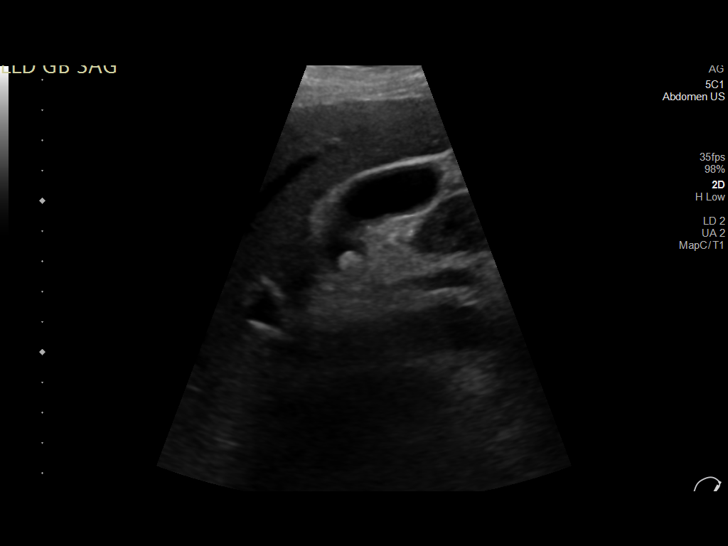
[im 10/37]
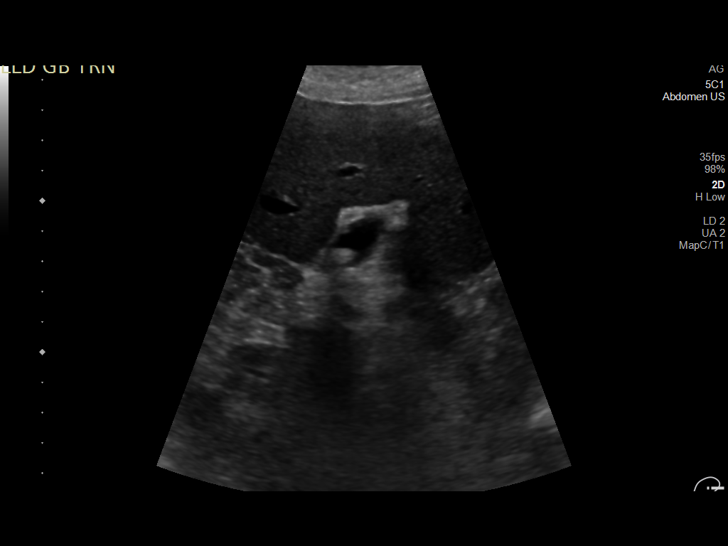
[im 13/37]
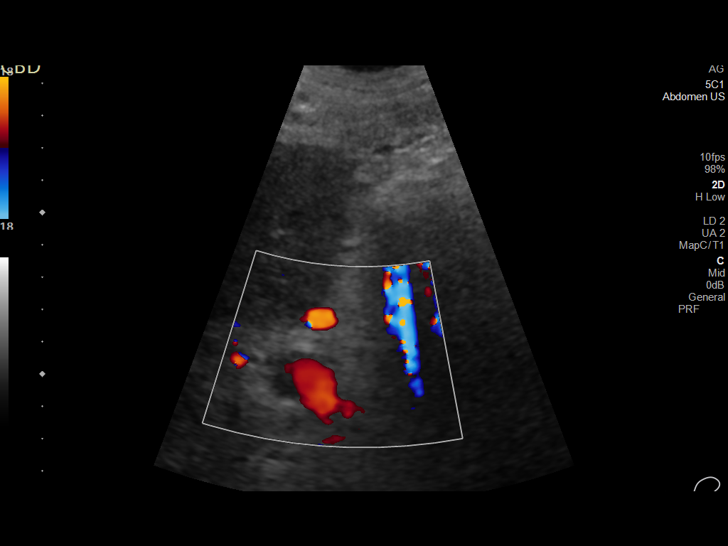
[im 14/37]
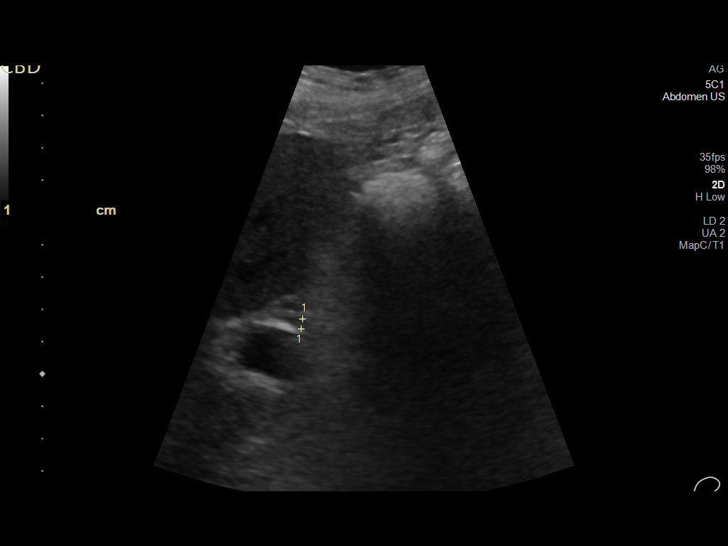
[im 17/37]
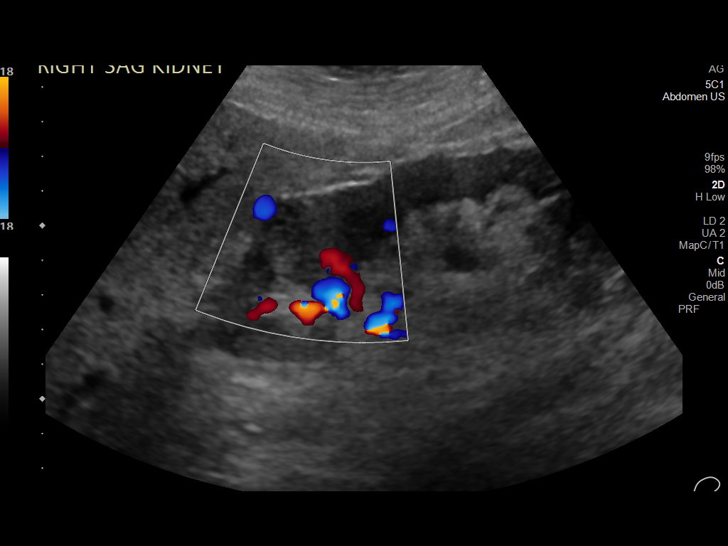
[im 20/37]
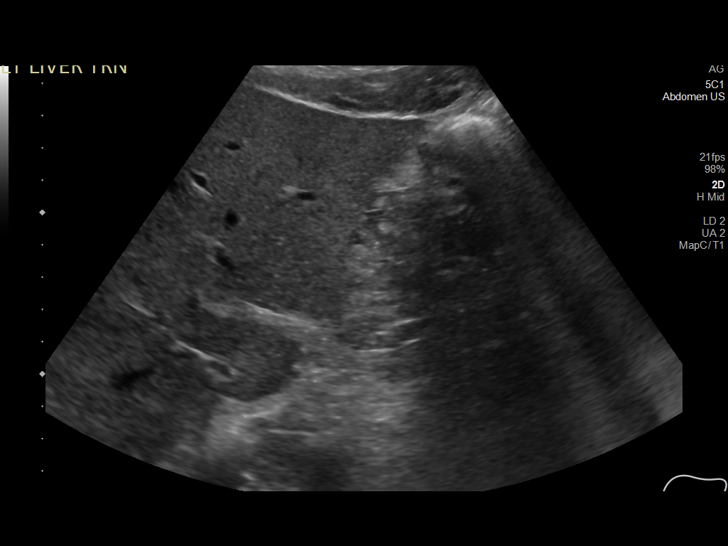
[im 23/37]
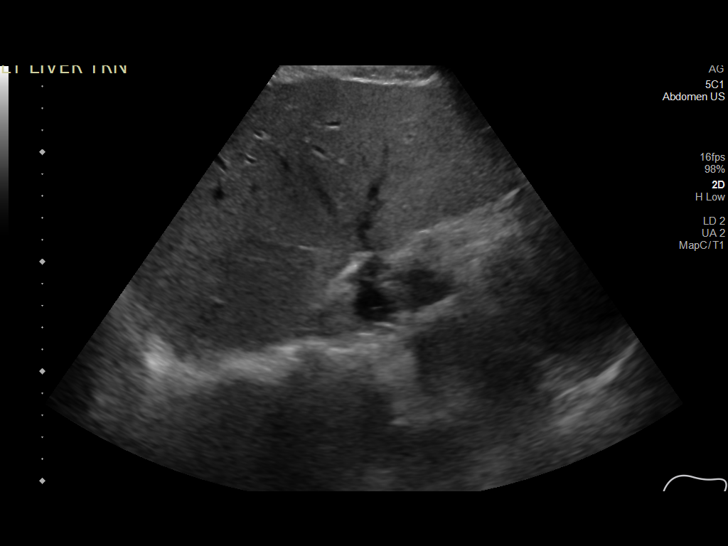
[im 25/37]
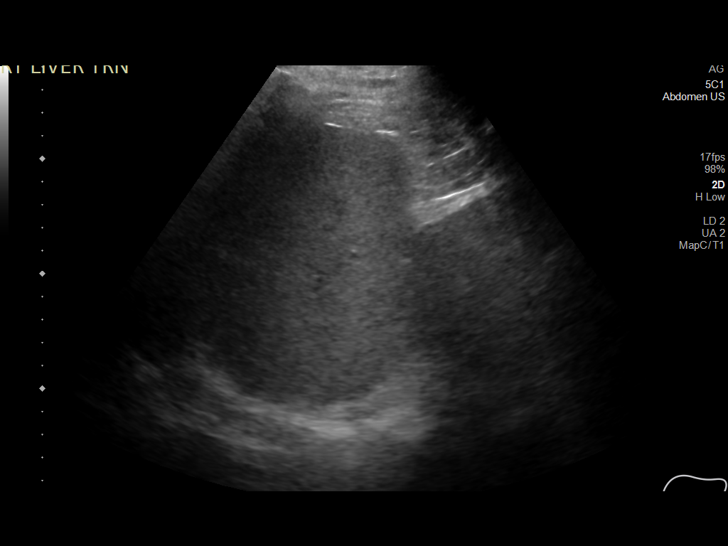
[im 28/37]
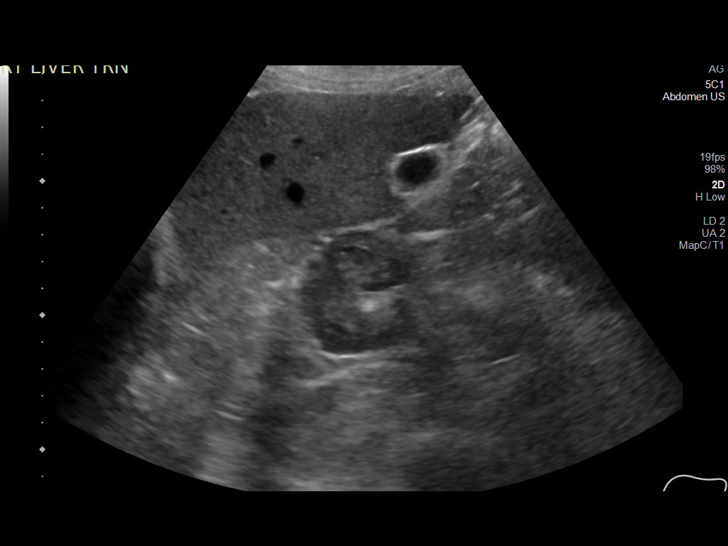
[im 31/37]
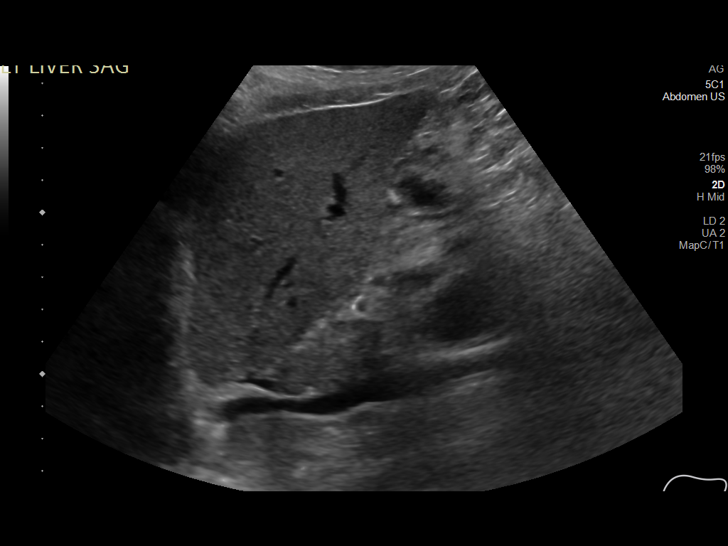
[im 34/37]
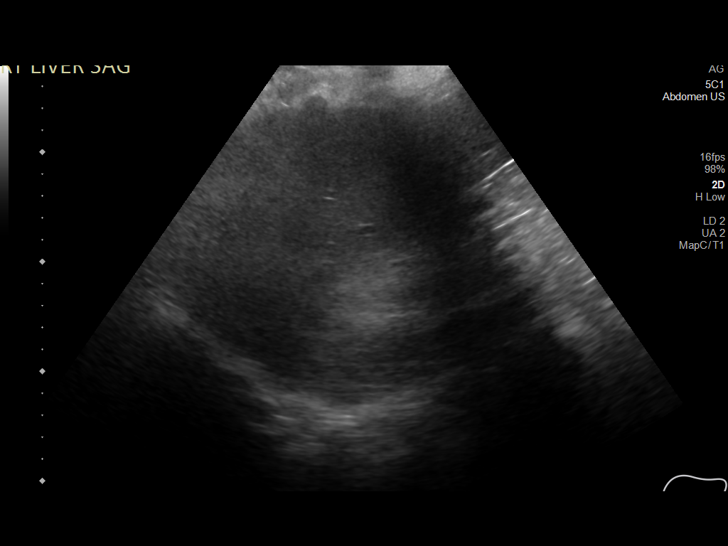
[im 37/37]
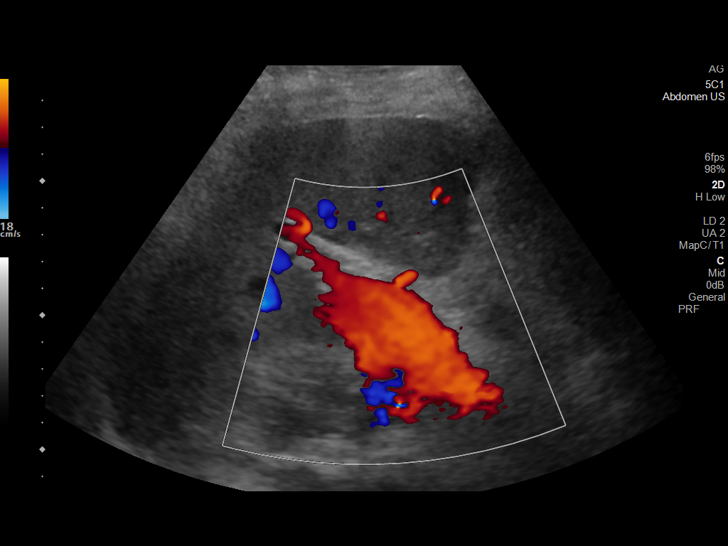

[14 of 25 positions shown; findings below may reference images not displayed]

FINDINGS: Gallbladder:

Gallstones measuring up to 7 mm. No gallbladder wall thickening. No
sonographic Murphy sign noted by sonographer.

Common bile duct:

Diameter: 3 mm

Liver:

Diffusely increased parenchymal echogenicity without a focal lesion
identified. Portal vein is patent on color Doppler imaging with
normal direction of blood flow towards the liver.

Other: Incidental 2.4 cm complex, partially hyperechoic mass in the
upper pole of the right kidney which appears solid with some blood
flow on color Doppler imaging.
IMPRESSION: 1. Cholelithiasis without evidence of cholecystitis.
2. 2.4 cm right upper pole renal mass highly suspicious for
neoplasm. Consider nonemergent abdominal MRI without and with
contrast (preferably as an outpatient) for further evaluation.
3. Echogenic liver suggestive of steatosis.

## 2020-04-14 IMAGING — CR DG CHEST 2V
2 series · 2 of 2 positions shown · non-contrast
Comparison: [DATE]

CLINICAL DATA: Chest pain and fever for 1 day

EXAM:
CHEST - 2 VIEW

[chest lat]
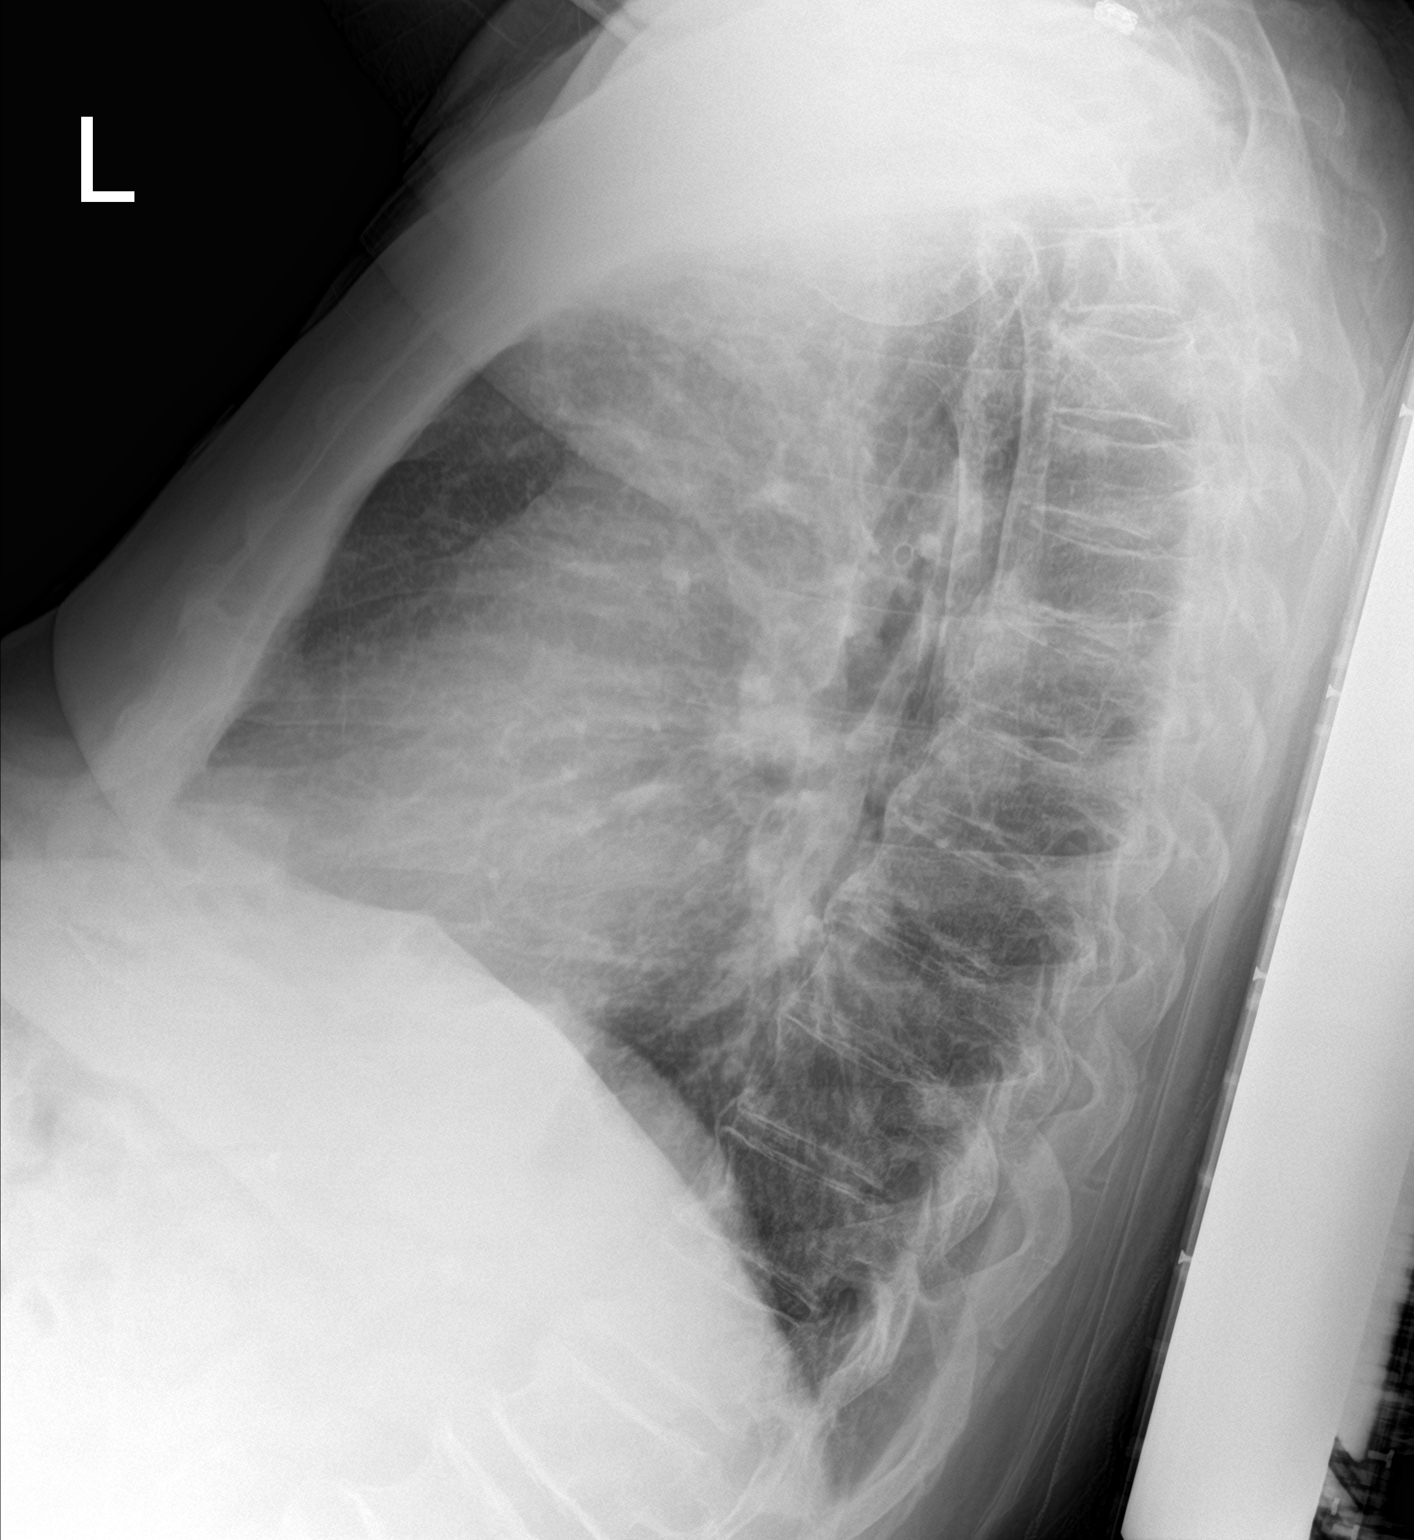

[chest ap]
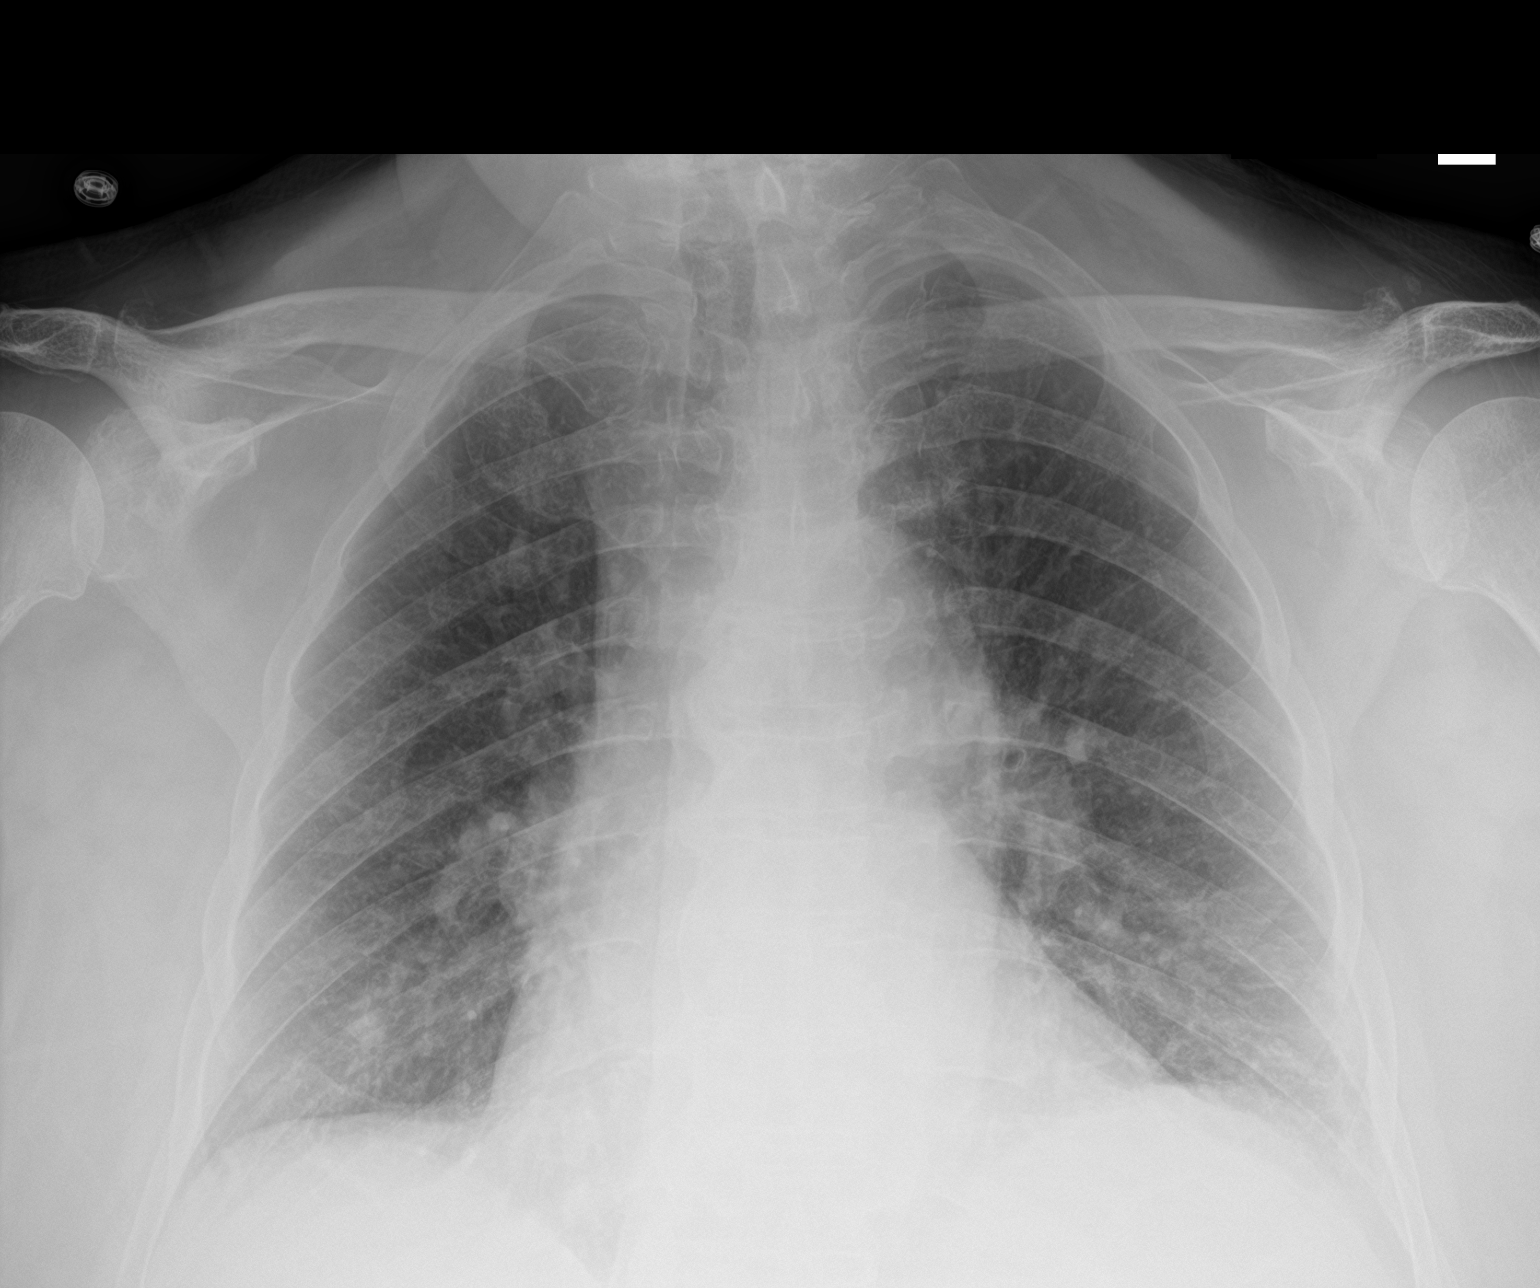

[2 of 2 positions shown; findings below may reference images not displayed]

FINDINGS: Cardiac shadow is mildly enlarged but stable. The lungs are well
aerated bilaterally. Mild atelectatic changes are again noted in the
left base laterally. No sizable effusion is noted. No bony
abnormality is seen.
IMPRESSION: Stable left basilar atelectasis.

## 2020-04-14 IMAGING — MR MR ABDOMEN WO/W CM MRCP
18 of 21 series · 42 of 48 positions shown · IV contrast (gadavist)
Comparison: CT and ultrasound from earlier in the same day.

CLINICAL DATA: Cholelithiasis

EXAM:
MRI ABDOMEN WITHOUT AND WITH CONTRAST (INCLUDING MRCP)
TECHNIQUE: Multiplanar multisequence MR imaging of the abdomen was performed
both before and after the administration of intravenous contrast.
Heavily T2-weighted images of the biliary and pancreatic ducts were
obtained, and three-dimensional MRCP images were rendered by post
processing.
CONTRAST:  9.5mL GADAVIST GADOBUTROL 1 MMOL/ML IV SOLN

[Series 7: cor haste · coronal · 6.0mm · 1.38mm/px · 1 of 40 slices shown]
[im 1/40]
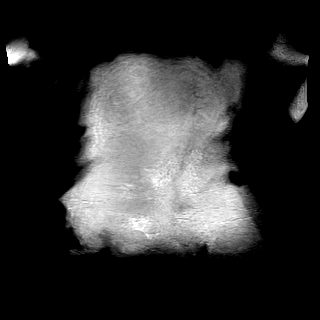

[Series 8: ax haste · axial · 6.0mm · 1.38mm/px · 1 of 47 slices shown]
[im 1/47]
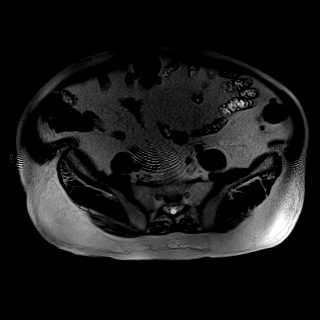

[Series 11: T2 fat-sat · axial · 6.0mm · 1.38mm/px · 1 of 47 slices shown]
[im 1/47]
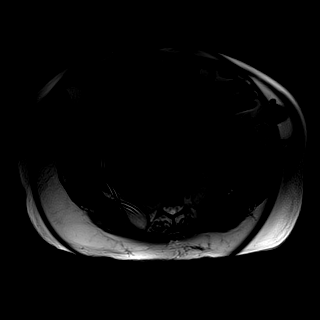

[Series 12: DWI · axial · 6.0mm · 1.64mm/px · z∈[-320,+11]mm · 3 of 141 slices shown (1 of 2)]
[im 1/141]
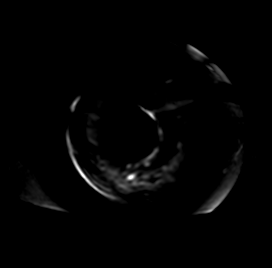
[im 71/141]
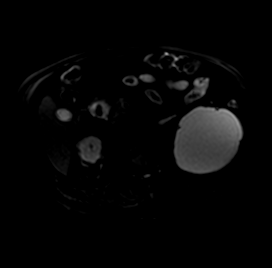
[im 141/141]
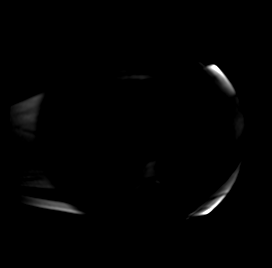

[Series 13: DWI · axial · 6.0mm · 1.64mm/px · 1 of 47 slices shown (2 of 2)]
[im 1/47]
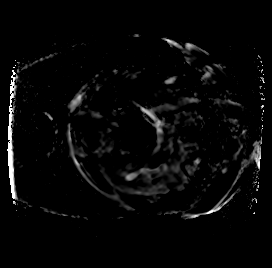

[Series 14: ax in and · axial · 3.0mm · 1.38mm/px · z∈[-321,+12]mm · 3 of 112 slices shown (1 of 2)]
[im 1/112]
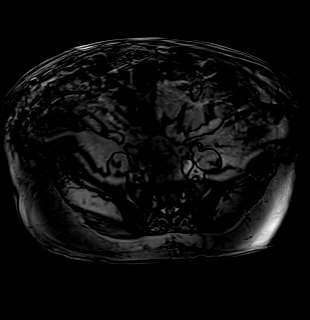
[im 56/112]
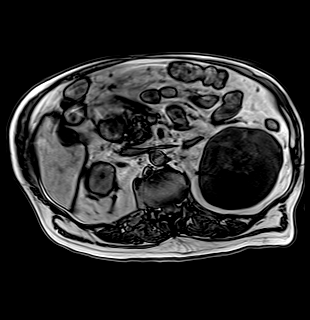
[im 112/112]
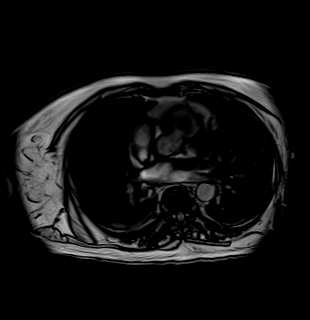

[Series 14: ax in and · axial · 3.0mm · 1.38mm/px · z∈[-321,+12]mm · 3 of 112 slices shown (2 of 2)]
[im 1/112]
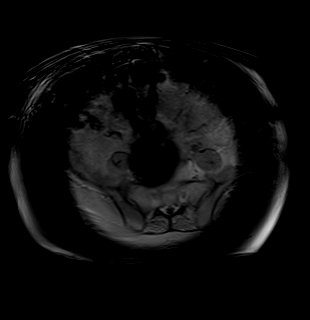
[im 56/112]
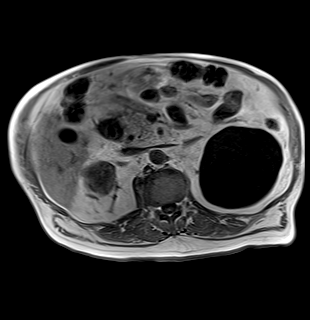
[im 112/112]
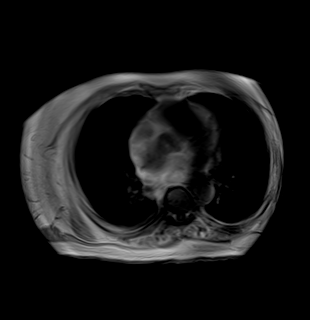

[Series 19: MRCP · coronal · 4.0mm · 1.38mm/px · 1 of 15 slices shown]
[im 1/15]
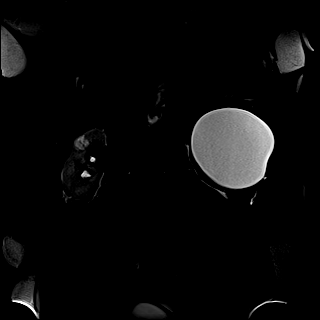

[Series 21: radials · coronal · 50.0mm · 0.79mm/px · 1 of 5 slices shown]
[im 1/5]
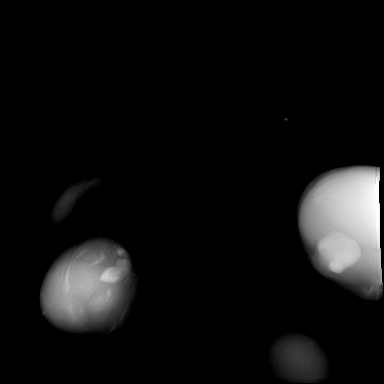

[Series 22: T1 dynamic · axial · non-contrast · 3.0mm · 1.38mm/px · z∈[-292,+17]mm · 3 of 104 slices shown (1 of 5)]
[im 1/104]
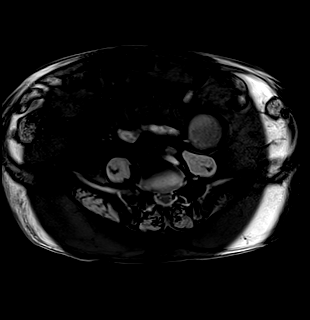
[im 52/104]
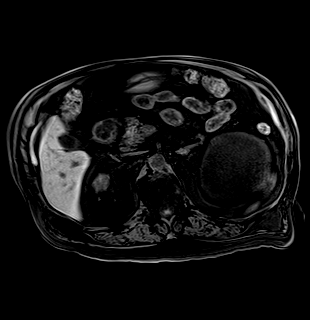
[im 104/104]
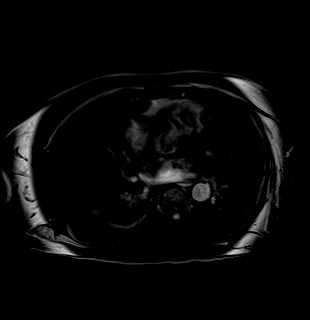

[Series 24: T1 dynamic post-contrast · axial · 3.0mm · 1.38mm/px · z∈[-292,+17]mm · 3 of 104 slices shown (1 of 4)]
[im 1/104]
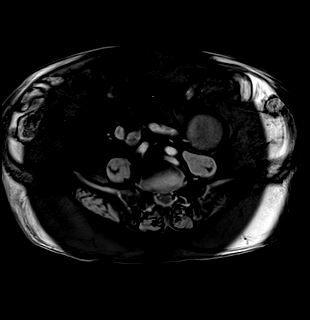
[im 52/104]
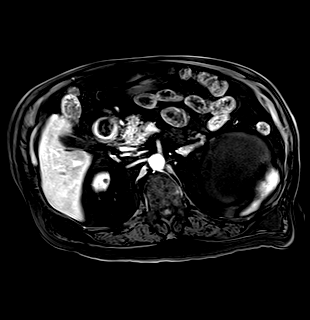
[im 104/104]
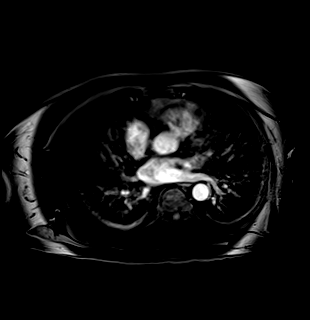

[Series 25: T1 dynamic · axial · 3.0mm · 1.38mm/px · z∈[-292,+17]mm · 3 of 104 slices shown (2 of 5)]
[im 1/104]
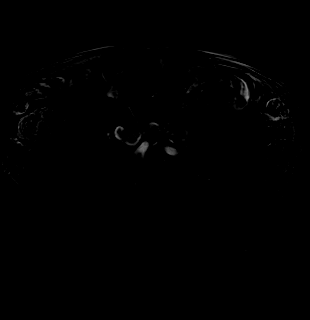
[im 52/104]
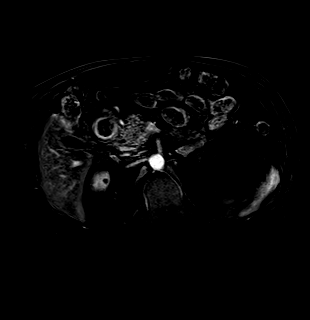
[im 104/104]
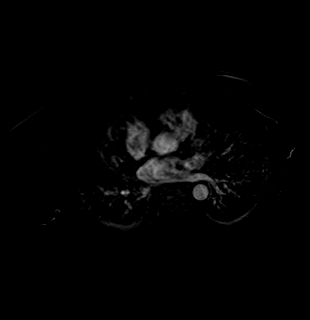

[Series 26: T1 dynamic post-contrast · axial · 3.0mm · 1.38mm/px · z∈[-292,+17]mm · 3 of 104 slices shown (2 of 4)]
[im 1/104]
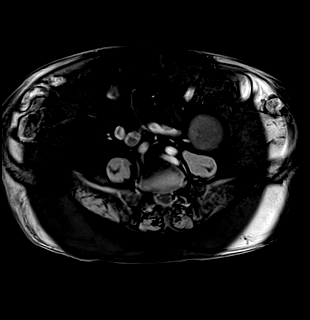
[im 52/104]
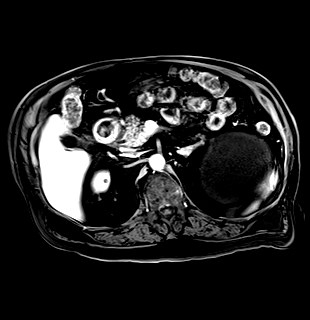
[im 104/104]
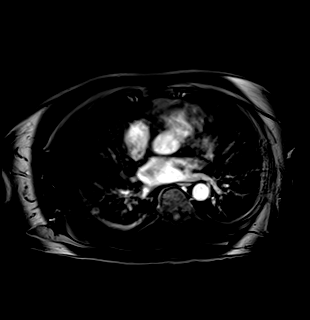

[Series 27: T1 dynamic · axial · 3.0mm · 1.38mm/px · z∈[-292,+17]mm · 3 of 104 slices shown (3 of 5)]
[im 1/104]
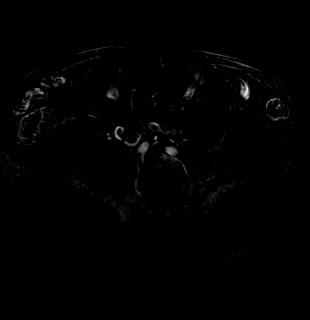
[im 52/104]
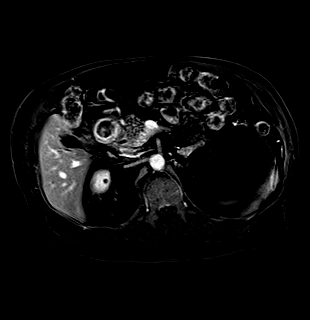
[im 104/104]
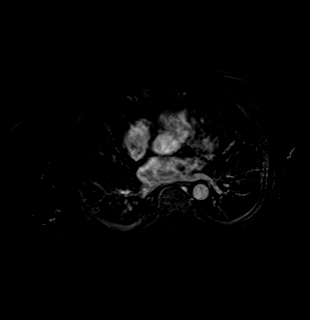

[Series 28: T1 dynamic post-contrast · axial · 3.0mm · 1.38mm/px · z∈[-292,+17]mm · 3 of 104 slices shown (3 of 4)]
[im 1/104]
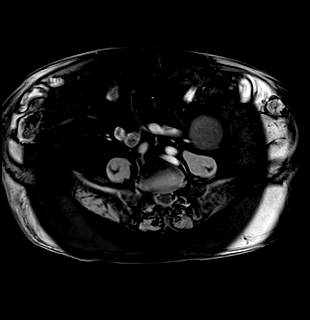
[im 52/104]
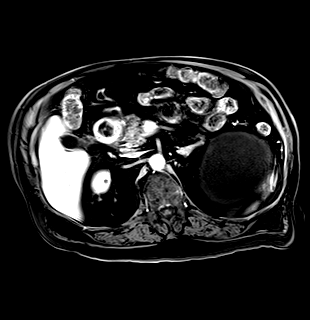
[im 104/104]
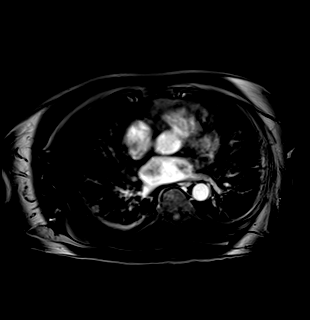

[Series 29: T1 dynamic · axial · 3.0mm · 1.38mm/px · z∈[-292,+17]mm · 3 of 104 slices shown (4 of 5)]
[im 1/104]
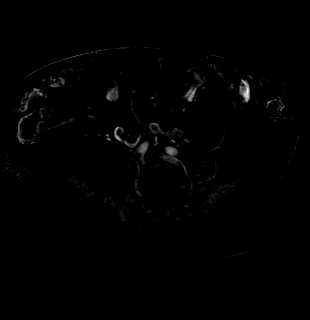
[im 52/104]
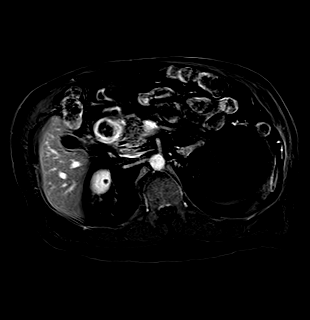
[im 104/104]
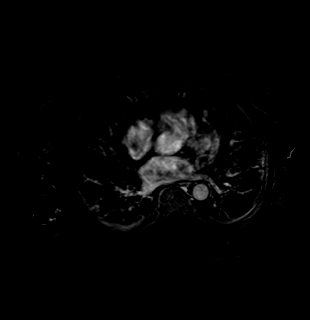

[Series 30: T1 dynamic post-contrast · axial · 3.0mm · 1.38mm/px · z∈[-292,+17]mm · 3 of 104 slices shown (4 of 4)]
[im 1/104]
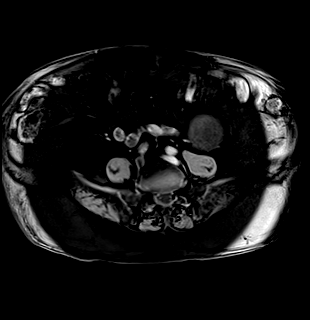
[im 52/104]
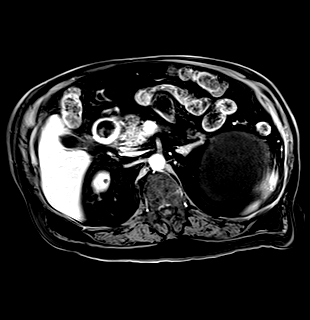
[im 104/104]
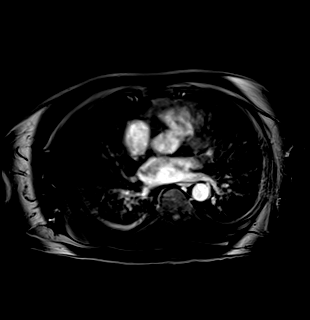

[Series 31: T1 dynamic · axial · 3.0mm · 1.38mm/px · z∈[-292,+17]mm · 3 of 104 slices shown (5 of 5)]
[im 1/104]
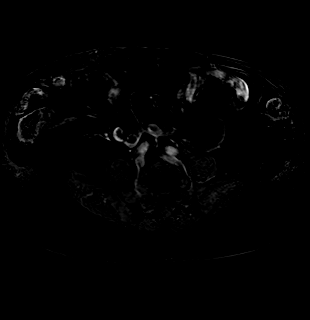
[im 52/104]
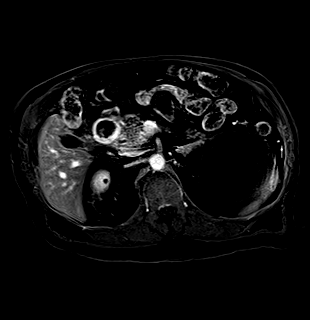
[im 104/104]
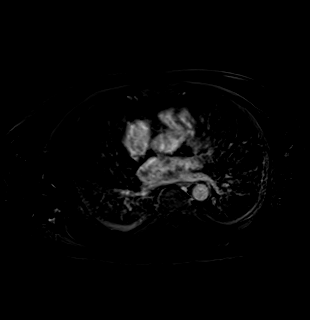

[42 of 48 positions shown; findings below may reference images not displayed]

FINDINGS: Lower chest: No focal infiltrate or sizable effusion is noted.

Hepatobiliary: Tiny cyst is noted along the anterior margin of the
right lobe of the liver measuring less than 1 cm better visualized
than on the prior CT examination. Hypointense area is noted in the
tip of the right lobe of the liver better visualized than on prior
CT which demonstrates some peripheral enhancement and filling in on
delayed post gadolinium images consistent with a small hemangioma.
This measures approximate with 1.4 cm in dimension. No other areas
of abnormal enhancement are noted within the liver.

Gallbladder is well visualized and demonstrates dependent
cholelithiasis similar to that seen on prior CT and ultrasound. The
biliary tree is within normal limits. No findings to suggest
choledocholithiasis are seen.

Pancreas:  Pancreas is well visualized and within normal limits.

Spleen:  Within normal limits in size and appearance.

Adrenals/Urinary Tract: Adrenal glands are within normal limits.
Multiple renal cysts are again identified similar to that seen on
prior CT examination. Hypointense lesion is noted in the upper pole
of right kidney measuring approximately 2.6 cm. Following contrast
administration there is irregular enhancement identified consistent
with a renal cell neoplasm. No obstructive changes are seen. Bladder
is not visualized on these images.

Stomach/Bowel: There is some mild inflammatory change surrounding
the second portion the duodenum better visualized than on the prior
CT examination. This is consistent with duodenitis. No findings to
suggest perforation are noted. Small duodenal diverticulum is noted
with fluid within adjacent to the ampulla of Vater. The remainder of
the visualized small bowel appears within normal limits. Visualized
colon is unremarkable. The previously seen inflammatory changes in
the sigmoid are not evaluated on this exam.

Vascular/Lymphatic: No aneurysmal dilatation is seen. No significant
lymphadenopathy is noted.

Other:  No free fluid is noted.

Musculoskeletal: Visualized bony structures show no acute
abnormality.
IMPRESSION: Changes consistent with cholelithiasis. No choledocholithiasis is
seen. No gallbladder inflammatory changes noted.

Thickening and mild inflammatory changes in the second portion the
duodenum consistent with duodenitis. A small duodenal diverticulum
is identified with fluid within. No definitive perforation is seen.
This may contribute to the patient's elevated LFTs although no
biliary ductal dilatation is seen.

2.6 cm enhancing mass lesion in the upper pole of the right kidney
as described consistent with renal neoplasm.

Small hepatic cyst and small hepatic hemangioma.

## 2020-04-14 IMAGING — CT CT ANGIO CHEST
2 of 6 series · 16 of 46 positions shown · IV contrast (APPLIED)
Comparison: None.

CLINICAL DATA: Shortness of breath with fevers and abdominal pain

EXAM:
CT ANGIOGRAPHY CHEST
CT ABDOMEN AND PELVIS WITH CONTRAST
TECHNIQUE: Multidetector CT imaging of the chest was performed using the
standard protocol during bolus administration of intravenous
contrast. Multiplanar CT image reconstructions and MIPs were
obtained to evaluate the vascular anatomy. Multidetector CT imaging
of the abdomen and pelvis was performed using the standard protocol
during bolus administration of intravenous contrast.
CONTRAST:  70mL OMNIPAQUE IOHEXOL 350 MG/ML SOLN

[Series 5: thins · axial · 0.86mm/px · z∈[+1193,+1439]mm · 13 of 385 slices shown]
[im 17/385  lung]
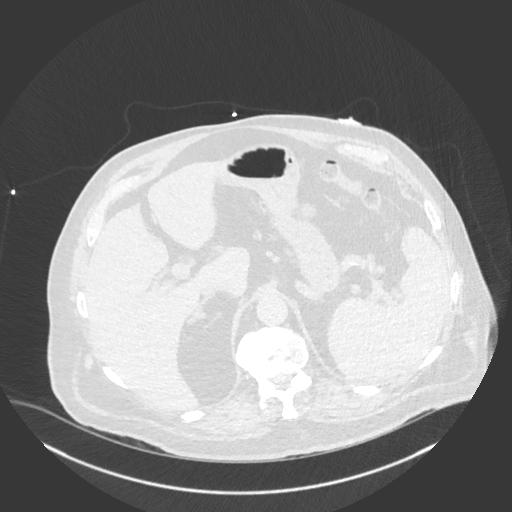
[im 51/385  soft-tissue]
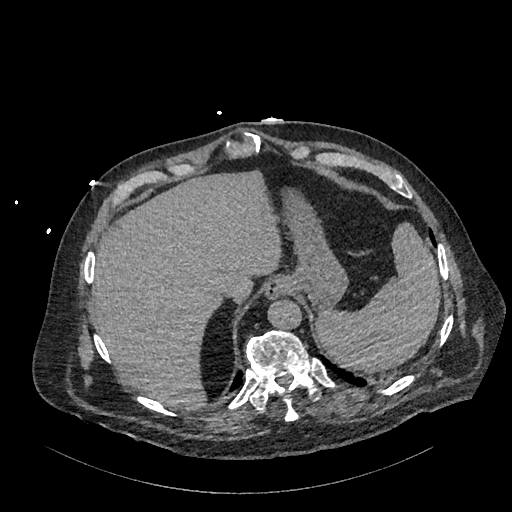
[im 84/385  lung]
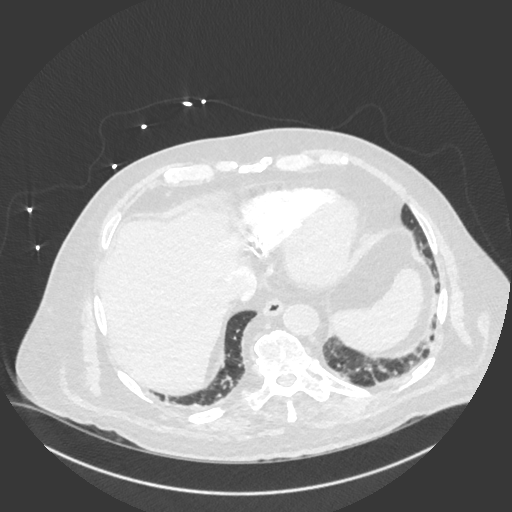
[im 101/385  soft-tissue]
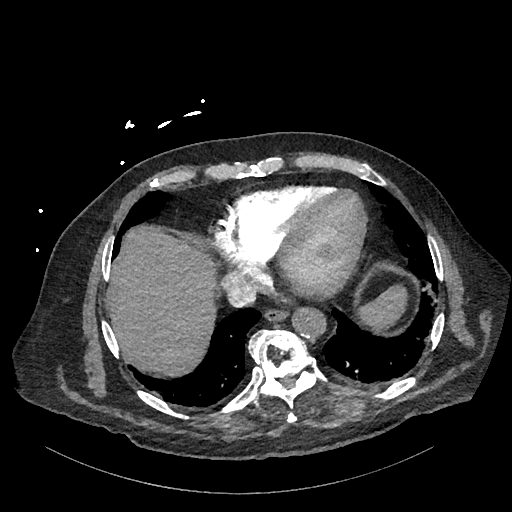
[im 134/385  lung]
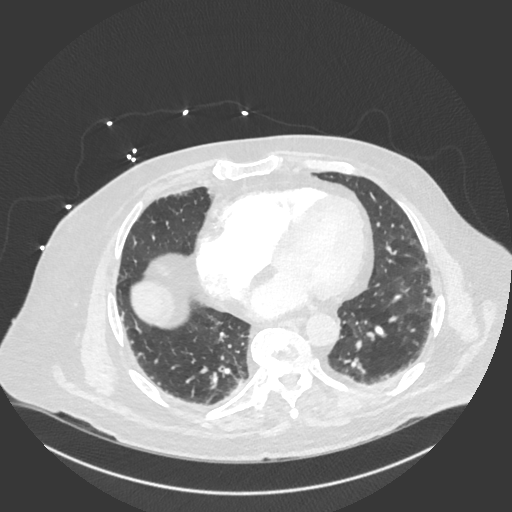
[im 167/385  soft-tissue]
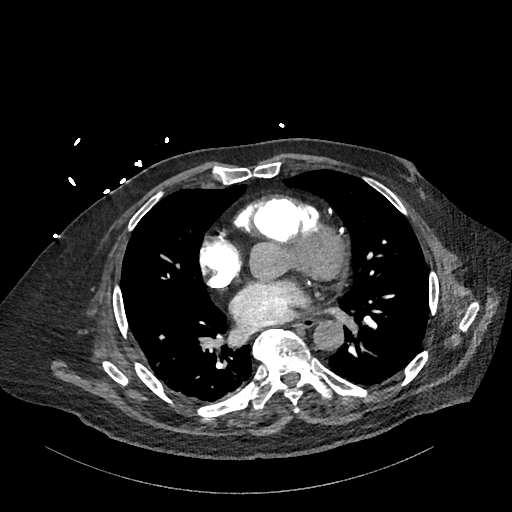
[im 201/385  lung]
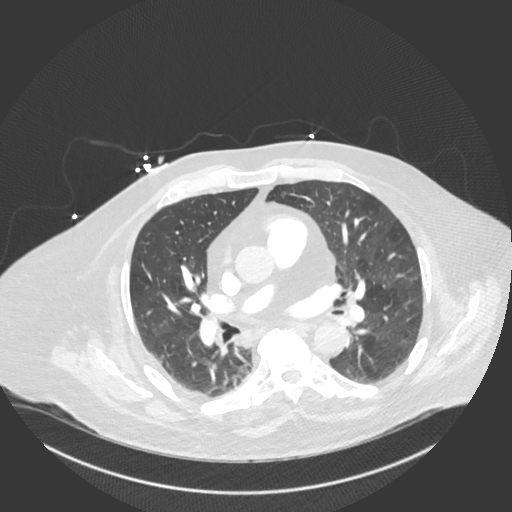
[im 218/385  soft-tissue]
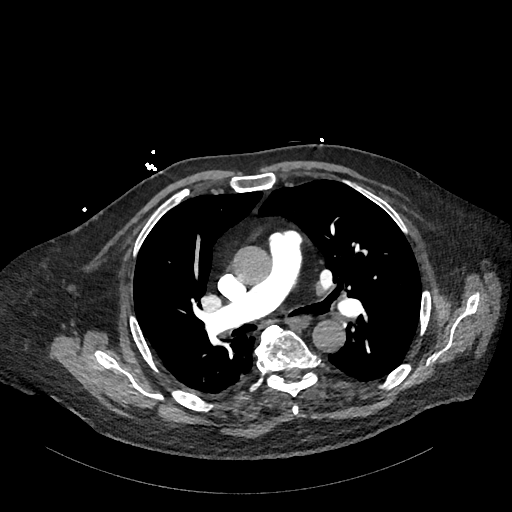
[im 251/385  lung]
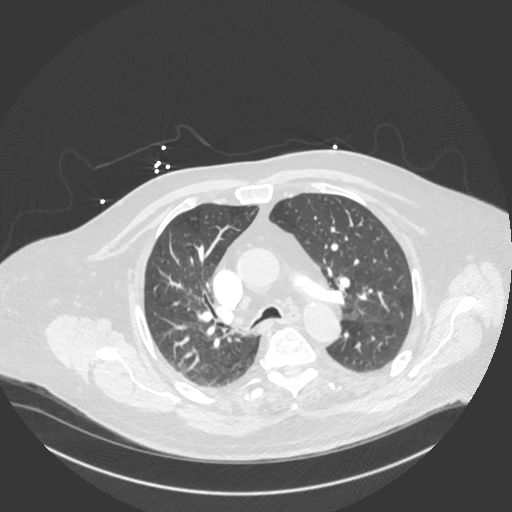
[im 284/385  soft-tissue]
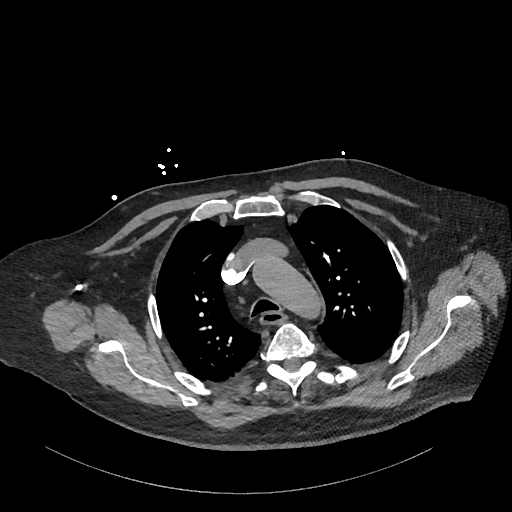
[im 301/385  lung]
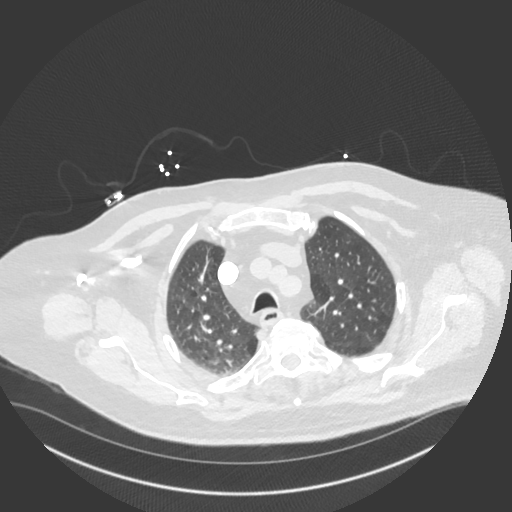
[im 334/385  soft-tissue]
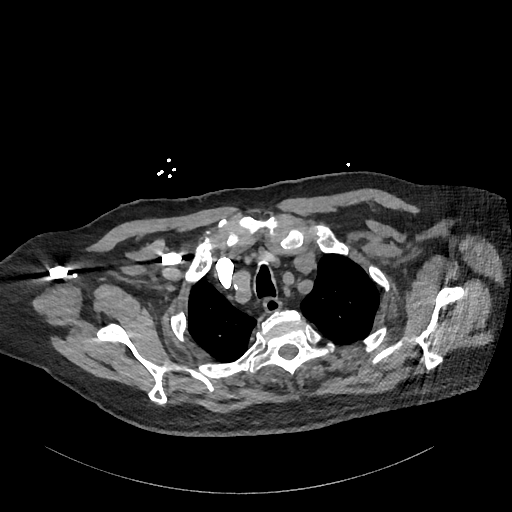
[im 368/385  lung]
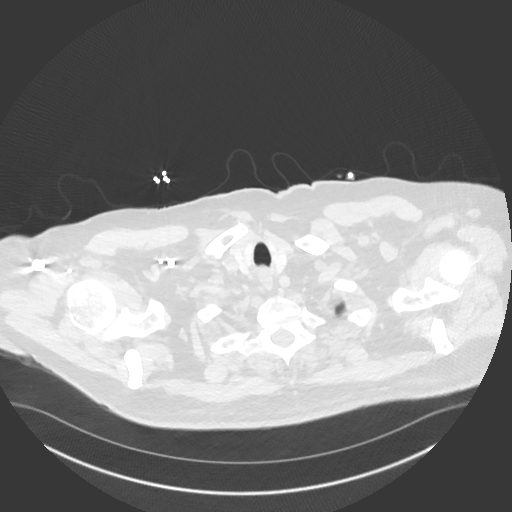

[Series 6: cor · coronal · 0.53mm/px · 3 of 147 slices shown]
[im 37/147  soft-tissue]
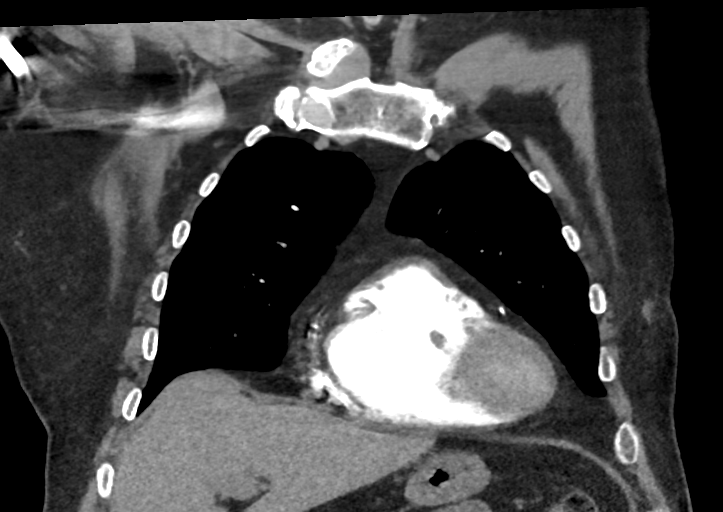
[im 74/147  soft-tissue]
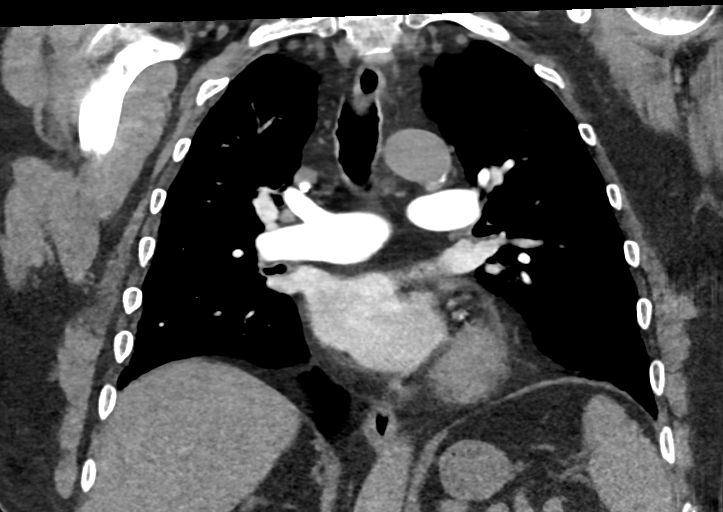
[im 110/147  soft-tissue]
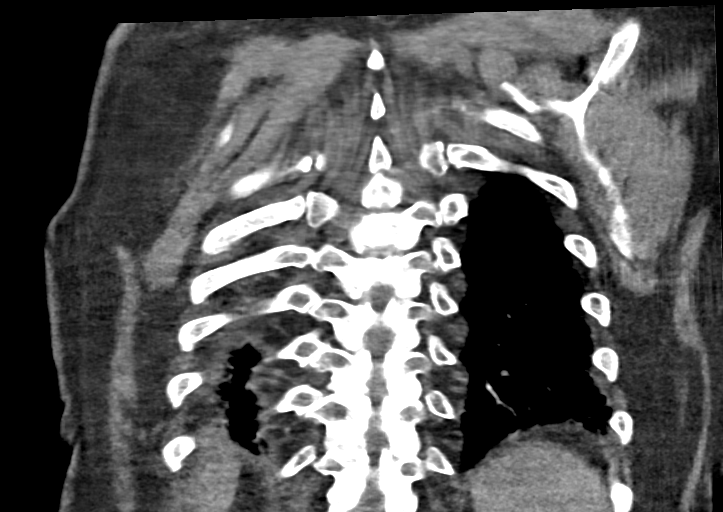

[16 of 46 positions shown; findings below may reference images not displayed]

FINDINGS: CTA CHEST FINDINGS

Cardiovascular: Thoracic aorta demonstrates mild atherosclerotic
calcifications. No aneurysmal dilatation is seen. No significant
opacification is noted. Coronary calcifications are seen. No cardiac
enlargement is noted. The pulmonary artery is well visualized with a
normal branching pattern bilaterally. No filling defect to suggest
pulmonary embolism is noted.

Mediastinum/Nodes: Thoracic inlet is within normal limits. No
sizable hilar or mediastinal adenopathy is noted. A few small
calcified nodes are seen consistent with prior granulomatous
disease. The esophagus is within normal limits.

Lungs/Pleura: Mild dependent atelectatic changes are noted
bilaterally. Some mosaic attenuation in the lungs is noted
consistent with air trapping. No focal confluent infiltrate or
sizable effusion is seen. No parenchymal nodules are noted.
Calcified granuloma is noted in the right lower lobe.

Musculoskeletal: Degenerative changes of the thoracic spine are
noted. No acute rib abnormality is noted.

Review of the MIP images confirms the above findings.

CT ABDOMEN and PELVIS FINDINGS

Hepatobiliary: Gallbladder is partially distended with a few small
dependent gallstones. No pericholecystic fluid is noted. The liver
shows fatty infiltration.

Pancreas: Unremarkable. No pancreatic ductal dilatation or
surrounding inflammatory changes.

Spleen: Normal in size without focal abnormality.

Adrenals/Urinary Tract: Adrenal glands are unremarkable bilaterally.
Kidneys are well visualized bilaterally with large bilateral renal
cysts. The largest of these on the right measures 8.5 cm. The
largest of these on the left measures 13 cm in greatest dimension.
Normal enhancement and excretion is seen. Bladder is partially
distended. Ureters are within normal limits. No renal calculi are
seen.

Stomach/Bowel: Scattered diverticular change of the colon is noted.
Very mild pericolonic inflammatory changes noted in the proximal
sigmoid colon consistent with early diverticulitis. No perforation
or abscess formation is noted. The appendix is within normal limits.
The remainder of the colon is unremarkable. Stomach is decompressed.
Small bowel is unremarkable.

Vascular/Lymphatic: Aortic atherosclerosis. No enlarged abdominal or
pelvic lymph nodes.

Reproductive: Prostate is unremarkable.

Other: No abdominal wall hernia or abnormality. No abdominopelvic
ascites.

Musculoskeletal: Degenerative changes of lumbar spine are noted. No
compression deformity is noted.

Review of the MIP images confirms the above findings.
IMPRESSION: CTA of the chest: No evidence of pulmonary emboli.

Mild dependent atelectatic changes.

Prior granulomatous disease.

Aortic Atherosclerosis ([71]-[71]).

CT of the abdomen and pelvis: Mild diverticulitis in the sigmoid
without evidence of perforation or abscess formation.

Cholelithiasis without complicating factors.

Bilateral renal cysts as described. No obstructive changes are
noted.

## 2020-04-14 MED ORDER — METOPROLOL TARTRATE 25 MG PO TABS
25.0000 mg | ORAL_TABLET | Freq: Two times a day (BID) | ORAL | Status: DC
  Administered 2020-04-14 – 2020-04-17 (×6): 25 mg via ORAL
  Filled 2020-04-14 (×6): qty 1

## 2020-04-14 MED ORDER — NITROGLYCERIN 0.4 MG SL SUBL: 0.4000 mg | SUBLINGUAL_TABLET | SUBLINGUAL | Status: DC | PRN

## 2020-04-14 MED ORDER — LACTATED RINGERS IV SOLN: INTRAVENOUS | Status: DC

## 2020-04-14 MED ORDER — LISINOPRIL 20 MG PO TABS
20.0000 mg | ORAL_TABLET | Freq: Every day | ORAL | Status: DC
  Administered 2020-04-15: 20 mg via ORAL
  Filled 2020-04-14: qty 1

## 2020-04-14 MED ORDER — HEPARIN SODIUM (PORCINE) 5000 UNIT/ML IJ SOLN
5000.0000 [IU] | Freq: Three times a day (TID) | INTRAMUSCULAR | Status: DC
  Administered 2020-04-14 – 2020-04-17 (×7): 5000 [IU] via SUBCUTANEOUS
  Filled 2020-04-14 (×8): qty 1

## 2020-04-14 MED ORDER — INSULIN GLARGINE 100 UNIT/ML ~~LOC~~ SOLN
50.0000 [IU] | Freq: Every day | SUBCUTANEOUS | Status: DC
  Administered 2020-04-15 – 2020-04-16 (×3): 50 [IU] via SUBCUTANEOUS
  Filled 2020-04-14 (×5): qty 0.5

## 2020-04-14 MED ORDER — ONDANSETRON HCL 4 MG/2ML IJ SOLN: 4.0000 mg | Freq: Four times a day (QID) | INTRAMUSCULAR | Status: DC | PRN

## 2020-04-14 MED ORDER — CO Q 10 100 MG PO CAPS: 200.0000 mg | ORAL_CAPSULE | Freq: Every day | ORAL | Status: DC

## 2020-04-14 MED ORDER — ISOSORBIDE MONONITRATE ER 30 MG PO TB24
30.0000 mg | ORAL_TABLET | Freq: Every day | ORAL | Status: DC
  Administered 2020-04-15 – 2020-04-17 (×3): 30 mg via ORAL
  Filled 2020-04-14 (×3): qty 1

## 2020-04-14 MED ORDER — IOHEXOL 350 MG/ML SOLN
70.0000 mL | Freq: Once | INTRAVENOUS | Status: AC | PRN
  Administered 2020-04-14: 70 mL via INTRAVENOUS

## 2020-04-14 MED ORDER — INSULIN ASPART 100 UNIT/ML ~~LOC~~ SOLN
0.0000 [IU] | Freq: Three times a day (TID) | SUBCUTANEOUS | Status: DC
  Administered 2020-04-15: 3 [IU] via SUBCUTANEOUS
  Administered 2020-04-15: 8 [IU] via SUBCUTANEOUS
  Administered 2020-04-16: 5 [IU] via SUBCUTANEOUS
  Administered 2020-04-16: 2 [IU] via SUBCUTANEOUS
  Administered 2020-04-16: 8 [IU] via SUBCUTANEOUS

## 2020-04-14 MED ORDER — INSULIN ASPART 100 UNIT/ML ~~LOC~~ SOLN
0.0000 [IU] | Freq: Every day | SUBCUTANEOUS | Status: DC
  Administered 2020-04-15: 3 [IU] via SUBCUTANEOUS
  Administered 2020-04-16: 2 [IU] via SUBCUTANEOUS

## 2020-04-14 MED ORDER — ASPIRIN EC 81 MG PO TBEC
81.0000 mg | DELAYED_RELEASE_TABLET | Freq: Every day | ORAL | Status: DC
  Administered 2020-04-15 – 2020-04-17 (×3): 81 mg via ORAL
  Filled 2020-04-14 (×3): qty 1

## 2020-04-14 MED ORDER — LACTATED RINGERS IV BOLUS
1000.0000 mL | Freq: Once | INTRAVENOUS | Status: AC
  Administered 2020-04-14: 1000 mL via INTRAVENOUS

## 2020-04-14 MED ORDER — ACETAMINOPHEN 650 MG RE SUPP: 650.0000 mg | Freq: Four times a day (QID) | RECTAL | Status: DC | PRN

## 2020-04-14 MED ORDER — ACETAMINOPHEN 325 MG PO TABS: 650.0000 mg | ORAL_TABLET | Freq: Four times a day (QID) | ORAL | Status: DC | PRN

## 2020-04-14 MED ORDER — GADOBUTROL 1 MMOL/ML IV SOLN
9.5000 mL | Freq: Once | INTRAVENOUS | Status: AC | PRN
  Administered 2020-04-14: 9.5 mL via INTRAVENOUS

## 2020-04-14 MED ORDER — ONDANSETRON HCL 4 MG PO TABS: 4.0000 mg | ORAL_TABLET | Freq: Four times a day (QID) | ORAL | Status: DC | PRN

## 2020-04-14 MED ORDER — PIPERACILLIN-TAZOBACTAM 3.375 G IVPB
3.3750 g | Freq: Three times a day (TID) | INTRAVENOUS | Status: DC
  Administered 2020-04-14 – 2020-04-16 (×5): 3.375 g via INTRAVENOUS
  Filled 2020-04-14 (×5): qty 50

## 2020-04-14 NOTE — ED Notes (Signed)
Patient transported to MRI 

## 2020-04-14 NOTE — Progress Notes (Signed)
Pharmacy Antibiotic Note  Ryan Garcia is a 86 y.o. male admitted on 04/14/2020 with possible cholangitis and possible diverticultis.  Pt experienced 3 days of diarrhea. Pharmacy has been consulted for zosyn dosing. This is day 1 of therapy. Pt Tmax 100.59F, WBC 5.5, LA 1.3. Per surg team, he was subtle CT findings that could indicate cholangitis and diverticulitis. Pt has no urinary symptoms reported.  Plan: Zosyn 3.375g IV q8h  Monitor renal function, CBC, micro data, clinical progress, and imaging reports    Temp (24hrs), Avg:100.1 F (37.8 C), Min:99.4 F (37.4 C), Max:100.8 F (38.2 C)  Recent Labs  Lab 04/11/20 1437 04/11/20 1804 04/12/20 0233 04/14/20 1442 04/14/20 1711  WBC 9.9  --  7.2 5.5  --   CREATININE 1.59* 1.51* 1.45* 1.91*  --   LATICACIDVEN  --   --   --   --  1.3    Estimated Creatinine Clearance: 36.9 mL/min (A) (by C-G formula based on SCr of 1.91 mg/dL (H)).    Allergies  Allergen Reactions  . Loratadine     Not effective  . Simvastatin     Intolerant, joint pain    Antimicrobials this admission: Zosyn 10/14>  Microbiology results: 10/14 resp panel neg 10/14 bcx sent 10/14 UA and UCx ordered  Thank you for allowing pharmacy to be a part of this patient's care.  Carolin Guernsey  PGY1 pharmacy resident  04/14/2020 7:31 PM

## 2020-04-14 NOTE — Telephone Encounter (Signed)
Thanks, I feel better with that, he should be examined

## 2020-04-14 NOTE — H&P (Signed)
History and Physical    KERIN CECCHI OEH:[id number] DOB: [birth year] DOA: 04/14/2020  PCP: Abner Greenspan, MD   Patient coming from: home  Chief Complaint: fever, abdominal pain, chest pain, nausea  HPI: Ryan Garcia is a 86 y.o. male with medical history significant for  extensive cardiac hx, HTN, DM, HLD, CKD - severe 3v dz previously treated with PCI as he wanted to avoid cabg - recently discharged 2d ago following left heart cath on 10/12 during admission for unstable angina. He was found to have severe 2v disease without any stents placed and is being treated medically. He was seen in urgent care earlier today for some right sided chest/rib pain and referred to [facility] ED. He reports having fever and chills since he was discharged 2 days ago after the heart catheterization.  He reports having some substernal chest pressure that did not radiate and was associated with shortness of breath when he would exert himself.  He also reports right upper quadrant abdominal pain that is worsened with movement and deep inspiration that began the day before he was discharged.  His wife reports he has not looked well and has had a slight pale and yellow appearance since he was discharged home.  Reports the pain in the abdomen does not radiate.  He has not noticed any alleviating factors for the pain.  He states he has not had a good appetite the last few days.  He was found to have elevated liver enzymes and total bilirubin.  CT of the abdomen pelvis showed gallstones and possible mild diverticulitis although he has no left-sided abdominal pain.  Right upper quadrant ultrasound showed no acute cholecystitis and possible left adrenal mass will need further work-up.  He reports he has been having some diarrhea for the last 2 days without blood or mucus in it.  Review of Systems:  General: Reports generalized weakness. Reports fever, chills. Denies weight loss, night sweats.  Denies dizziness. Reports decreased  appetite HENT: Denies head trauma, headache, denies change in hearing, tinnitus.  Denies nasal congestion or bleeding.  Denies sore throat, sores in mouth.  Denies difficulty swallowing Eyes: Denies blurry vision, pain in eye, drainage.  Denies discoloration of eyes. Neck: Denies pain.  Denies swelling.  Denies pain with movement. Cardiovascular: Reports chest pain, palpitations.  Denies edema.  Denies orthopnea Respiratory: Denies shortness of breath, cough.  Denies wheezing.  Denies sputum production Gastrointestinal: Reports abdominal pain with nausea, diarrhea. Denies vomiting. Denies melena.  Denies hematemesis. Musculoskeletal: Denies limitation of movement.  Denies deformity or swelling.  Denies pain.  Denies arthralgias or myalgias. Genitourinary: Denies pelvic pain.  Denies urinary frequency or hesitancy.  Denies dysuria.  Skin: Denies rash.  Denies petechiae, purpura, ecchymosis. Neurological: Denies headache.  Denies syncope.  Denies seizure activity.  Denies weakness or paresthesia.  Denies slurred speech, drooping face.  Denies visual change. Psychiatric: Denies depression, anxiety.  Denies suicidal thoughts or ideation.  Denies hallucinations.  Past Medical History:  Diagnosis Date  . AC (acromioclavicular) joint bone spurs    lt ankle  . Allergy    allergic rhinitis  . Anginal pain ([place])   . Arthritis    OA  . BPH (benign prostatic hyperplasia)    microwave tx of prostate  . CAD (coronary artery disease)    a. s/p cath in 2012 showing 70-75% LAD stenosis, 75% D1 stenosis, 75% OM1, 60-70% RCA stenosis, and 80% PDA --> medical therapy pursued b. similar findings by cath in  2016; 06/2017 DES to distal RCA  . Chronic upper back pain   . Diabetes mellitus    type II x8 years  . GERD (gastroesophageal reflux disease)   . HLD (hyperlipidemia)    10/12: TC 208, TG 213, HDL 36, LDL 129  . Hx of skin cancer, basal cell    many skin cancers removed/under constant treatment.  .  Hypertension   . NSTEMI (non-ST elevated myocardial infarction) ([place]) 06/18/2017   DES to distal RCA  . Obstructive sleep apnea on CPAP   . Scoliosis     Past Surgical History:  Procedure Laterality Date  . BREAST SURGERY  2009   breast lump removed benign  . CARDIAC CATHETERIZATION N/A 03/15/2015   Procedure: Left Heart Cath and Coronary Angiography;  Surgeon: Belva Crome, MD;  Location: [place] [facility];  Service: Cardiovascular;  Laterality: N/A;  . CORONARY STENT INTERVENTION N/A 06/18/2017   Procedure: CORONARY STENT INTERVENTION;  Surgeon: Jettie Booze, MD;  Location: [place] [facility];  Service: Cardiovascular;  Laterality: N/A;  RCA  . LEFT HEART CATH AND CORONARY ANGIOGRAPHY N/A 06/18/2017   Procedure: LEFT HEART CATH AND CORONARY ANGIOGRAPHY;  Surgeon: Jettie Booze, MD;  Location: [place] [facility];  Service: Cardiovascular;  Laterality: N/A;  . LEFT HEART CATH AND CORONARY ANGIOGRAPHY N/A 04/12/2020   Procedure: LEFT HEART CATH AND CORONARY ANGIOGRAPHY;  Surgeon: Martinique, Peter M, MD;  Location: [place] [facility];  Service: Cardiovascular;  Laterality: N/A;  . ULTRASOUND GUIDANCE FOR VASCULAR ACCESS  06/18/2017   Procedure: Ultrasound Guidance For Vascular Access;  Surgeon: Jettie Booze, MD;  Location: [place] [facility];  Service: Cardiovascular;;  right radial, right femoral    Social History  reports that he has never smoked. He has never used smokeless tobacco. He reports that he does not drink alcohol and does not use drugs.  Allergies  Allergen Reactions  . Loratadine     Not effective  . Simvastatin     Intolerant, joint pain    Family History  Problem Relation Age of Onset  . Heart attack Brother   . Colon cancer Neg Hx      Prior to Admission medications   Medication Sig Start Date End Date Taking? Authorizing Provider  acetaminophen (TYLENOL) 325 MG tablet Take 650 mg by mouth every 6 (six) hours as needed for mild pain or  headache.    Yes [provider]  aspirin 81 MG tablet Take 81 mg by mouth daily.     Yes [provider]  cetirizine (ZYRTEC) 10 MG tablet TAKE 1 TABLET EVERY DAY Patient taking differently: Take 10 mg by mouth daily as needed for allergies.  11/14/16  Yes Tower, Wynelle Fanny, MD  Coenzyme Q10 (CO Q 10 PO) Take 200 mg by mouth daily.   Yes [provider]  Cyanocobalamin (VITAMIN B12) 1000 MCG TBCR Take 1,000 mcg by mouth daily. 04/12/20  Yes Cheryln Manly, NP  fluticasone (FLONASE) 50 MCG/ACT nasal spray Place 1 spray into both nostrils daily as needed for allergies.    Yes [provider]  glipiZIDE (GLUCOTROL) 10 MG tablet TAKE 1 TABLET TWICE DAILY WITH MEALS Patient taking differently: Take 10 mg by mouth 2 (two) times daily before a meal.  12/15/19  Yes Tower, Marne A, MD  hydrochlorothiazide (HYDRODIURIL) 12.5 MG tablet TAKE 1 TABLET EVERY DAY Patient taking differently: Take 12.5 mg by mouth daily.  03/09/20  Yes Kirk Ruths  S, MD  insulin glargine (LANTUS SOLOSTAR) 100 UNIT/ML Solostar Pen INJECT 60 UNITS SUBCUTANEOUSLY AT BEDTIME Patient taking differently: Inject 50 Units into the skin at bedtime.  01/20/20  Yes Tower, Wynelle Fanny, MD  isosorbide mononitrate (IMDUR) 30 MG 24 hr tablet Take 1 tablet (30 mg total) by mouth daily. 11/26/19  Yes Lelon Perla, MD  lisinopril (ZESTRIL) 20 MG tablet TAKE 1 TABLET EVERY DAY Patient taking differently: Take 20 mg by mouth daily.  11/17/19  Yes Lelon Perla, MD  metFORMIN (GLUCOPHAGE) 1000 MG tablet TAKE 1 TABLET TWICE DAILY Patient taking differently: Take 1,000 mg by mouth 2 (two) times daily with a meal.  03/10/20  Yes Tower, Wynelle Fanny, MD  metoprolol tartrate (LOPRESSOR) 25 MG tablet Take 0.5 tablets (12.5 mg total) by mouth 2 (two) times daily. 11/05/19  Yes Lelon Perla, MD  nitroGLYCERIN (NITROSTAT) 0.4 MG SL tablet Place 1 tablet (0.4 mg total) under the tongue every 5 (five) minutes as needed for  chest pain. Patient taking differently: Place 0.4 mg under the tongue every 5 (five) minutes as needed for chest pain (max 3 doses).  05/11/19  Yes Duke, Tami Lin, PA  Blood Glucose Monitoring Suppl (TRUE METRIX AIR GLUCOSE METER) W/DEVICE KIT 1 kit by Other route 2 (two) times daily. Check blood sugar twice daily and as directed. Dx E11.65 03/17/15   Tower, Wynelle Fanny, MD  DROPLET PEN NEEDLES 31G X 6 MM MISC USE ONE TIME DAILY  AT  BEDTIME  WITH  LANTUS 02/10/20   Tower, Wynelle Fanny, MD  rosuvastatin (CRESTOR) 40 MG tablet Take 40 mg by mouth daily. Patient not taking: Reported on 04/14/2020 03/10/20   [provider]  TRUE METRIX BLOOD GLUCOSE TEST test strip CHECK BLOOD SUGAR TWICE DAILY 12/15/19   Tower, Wynelle Fanny, MD  TRUEPLUS LANCETS 30G MISC Check blood sugar twice daily and as directed. Dx E11.65 03/17/15   Abner Greenspan, MD    Physical Exam: Vitals:   04/14/20 1443 04/14/20 1514  BP: (!) 122/55   Pulse: 97   Resp: 18   Temp: 99.4 F (37.4 C) 100.1 F (37.8 C)  TempSrc: Oral Oral  SpO2: 96%     Constitutional: NAD, calm, comfortable Vitals:   04/14/20 1443 04/14/20 1514  BP: (!) 122/55   Pulse: 97   Resp: 18   Temp: 99.4 F (37.4 C) 100.1 F (37.8 C)  TempSrc: Oral Oral  SpO2: 96%    General: WDWN, Alert and oriented x3.  Eyes: EOMI, PERRL, lids and conjunctivae normal.  Sclera nonicteric HENT:  [place]/AT, external ears normal.  Nares patent without epistasis.  Mucous membranes are moist. Posterior pharynx clear of any exudate or lesions. Neck: Soft, normal range of motion, supple, no masses, no thyromegaly.  Trachea midline Respiratory: clear to auscultation bilaterally, no wheezing, no crackles. Normal respiratory effort. No accessory muscle use.  Cardiovascular: Sinus tachycardia, no murmurs / rubs / gallops. Mild lower extremity edema. 1+ pedal pulses.  Abdomen: Soft, RUQ tenderness to palpation. Positive Murphy's sign, nondistended, no rebound or guarding.  No masses  palpated. No hepatosplenomegaly. Bowel sounds normoactive Musculoskeletal: FROM. no clubbing / cyanosis. No joint deformity upper and lower extremities. Normal muscle tone.  Skin: Warm, dry, intact no rashes, lesions, ulcers. No induration Neurologic: CN 2-12 grossly intact.  Normal speech.  Sensation intact, patella DTR +1 bilaterally. Strength 5/5 in all extremities.   Psychiatric: Normal judgment and insight.  Normal mood.    Labs  on Admission: I have personally reviewed following labs and imaging studies  CBC: Recent Labs  Lab 04/11/20 1437 04/12/20 0233 04/14/20 1442  WBC 9.9 7.2 5.5  NEUTROABS 6.8  --   --   HGB 12.2* 11.8* 12.8*  HCT 38.8* 36.3* 40.3  MCV 96.0 92.6 93.7  PLT 165 153 144*    Basic Metabolic Panel: Recent Labs  Lab 04/11/20 1437 04/11/20 1804 04/12/20 0233 04/14/20 1442  NA 140  --  137 135  K 4.4  --  4.4 3.9  CL 107  --  105 104  CO2 22  --  21* 21*  GLUCOSE 210*  --  212* 351*  BUN 28*  --  28* 27*  CREATININE 1.59* 1.51* 1.45* 1.91*  CALCIUM 8.7*  --  8.8* 8.6*    GFR: Estimated Creatinine Clearance: 36.9 mL/min (A) (by C-G formula based on SCr of 1.91 mg/dL (H)).  Liver Function Tests: Recent Labs  Lab 04/11/20 1437 04/14/20 1711  AST 87* 131*  ALT 46* 322*  ALKPHOS 84 214*  BILITOT 1.5* 2.1*  PROT 6.1* 6.2*  ALBUMIN 3.3* 3.0*    Urine analysis:    Component Value Date/Time   COLORURINE YELLOW 04/14/2020 1950   APPEARANCEUR HAZY (A) 04/14/2020 1950   LABSPEC 1.021 04/14/2020 1950   PHURINE 5.0 04/14/2020 1950   GLUCOSEU 150 (A) 04/14/2020 1950   HGBUR SMALL (A) 04/14/2020 1950   BILIRUBINUR NEGATIVE 04/14/2020 1950   BILIRUBINUR Negative 09/23/2017 1018   KETONESUR NEGATIVE 04/14/2020 1950   PROTEINUR NEGATIVE 04/14/2020 1950   UROBILINOGEN 0.2 09/23/2017 1018   NITRITE NEGATIVE 04/14/2020 1950   LEUKOCYTESUR NEGATIVE 04/14/2020 1950    Radiological Exams on Admission: DG Chest 2 View  Result Date:  04/14/2020 CLINICAL DATA:  Chest pain and fever for 1 day EXAM: CHEST - 2 VIEW COMPARISON:  04/11/2020 FINDINGS: Cardiac shadow is mildly enlarged but stable. The lungs are well aerated bilaterally. Mild atelectatic changes are again noted in the left base laterally. No sizable effusion is noted. No bony abnormality is seen. IMPRESSION: Stable left basilar atelectasis. Electronically Signed   By: Inez Catalina M.D.   On: 04/14/2020 15:27   CT Angio Chest PE W and/or Wo Contrast  Result Date: 04/14/2020 CLINICAL DATA:  Shortness of breath with fevers and abdominal pain EXAM: CT ANGIOGRAPHY CHEST CT ABDOMEN AND PELVIS WITH CONTRAST TECHNIQUE: Multidetector CT imaging of the chest was performed using the standard protocol during bolus administration of intravenous contrast. Multiplanar CT image reconstructions and MIPs were obtained to evaluate the vascular anatomy. Multidetector CT imaging of the abdomen and pelvis was performed using the standard protocol during bolus administration of intravenous contrast. CONTRAST:  65m OMNIPAQUE IOHEXOL 350 MG/ML SOLN COMPARISON:  None. FINDINGS: CTA CHEST FINDINGS Cardiovascular: Thoracic aorta demonstrates mild atherosclerotic calcifications. No aneurysmal dilatation is seen. No significant opacification is noted. Coronary calcifications are seen. No cardiac enlargement is noted. The pulmonary artery is well visualized with a normal branching pattern bilaterally. No filling defect to suggest pulmonary embolism is noted. Mediastinum/Nodes: Thoracic inlet is within normal limits. No sizable hilar or mediastinal adenopathy is noted. A few small calcified nodes are seen consistent with prior granulomatous disease. The esophagus is within normal limits. Lungs/Pleura: Mild dependent atelectatic changes are noted bilaterally. Some mosaic attenuation in the lungs is noted consistent with air trapping. No focal confluent infiltrate or sizable effusion is seen. No parenchymal  nodules are noted. Calcified granuloma is noted in the right lower lobe. Musculoskeletal:  Degenerative changes of the thoracic spine are noted. No acute rib abnormality is noted. Review of the MIP images confirms the above findings. CT ABDOMEN and PELVIS FINDINGS Hepatobiliary: Gallbladder is partially distended with a few small dependent gallstones. No pericholecystic fluid is noted. The liver shows fatty infiltration. Pancreas: Unremarkable. No pancreatic ductal dilatation or surrounding inflammatory changes. Spleen: Normal in size without focal abnormality. Adrenals/Urinary Tract: Adrenal glands are unremarkable bilaterally. Kidneys are well visualized bilaterally with large bilateral renal cysts. The largest of these on the right measures 8.5 cm. The largest of these on the left measures 13 cm in greatest dimension. Normal enhancement and excretion is seen. Bladder is partially distended. Ureters are within normal limits. No renal calculi are seen. Stomach/Bowel: Scattered diverticular change of the colon is noted. Very mild pericolonic inflammatory changes noted in the proximal sigmoid colon consistent with early diverticulitis. No perforation or abscess formation is noted. The appendix is within normal limits. The remainder of the colon is unremarkable. Stomach is decompressed. Small bowel is unremarkable. Vascular/Lymphatic: Aortic atherosclerosis. No enlarged abdominal or pelvic lymph nodes. Reproductive: Prostate is unremarkable. Other: No abdominal wall hernia or abnormality. No abdominopelvic ascites. Musculoskeletal: Degenerative changes of lumbar spine are noted. No compression deformity is noted. Review of the MIP images confirms the above findings. IMPRESSION: CTA of the chest: No evidence of pulmonary emboli. Mild dependent atelectatic changes. Prior granulomatous disease. Aortic Atherosclerosis (ICD10-I70.0). CT of the abdomen and pelvis: Mild diverticulitis in the sigmoid without evidence of  perforation or abscess formation. Cholelithiasis without complicating factors. Bilateral renal cysts as described. No obstructive changes are noted. Electronically Signed   By: Inez Catalina M.D.   On: 04/14/2020 18:09   CT ABDOMEN PELVIS W CONTRAST  Result Date: 04/14/2020 CLINICAL DATA:  Shortness of breath with fevers and abdominal pain EXAM: CT ANGIOGRAPHY CHEST CT ABDOMEN AND PELVIS WITH CONTRAST TECHNIQUE: Multidetector CT imaging of the chest was performed using the standard protocol during bolus administration of intravenous contrast. Multiplanar CT image reconstructions and MIPs were obtained to evaluate the vascular anatomy. Multidetector CT imaging of the abdomen and pelvis was performed using the standard protocol during bolus administration of intravenous contrast. CONTRAST:  88m OMNIPAQUE IOHEXOL 350 MG/ML SOLN COMPARISON:  None. FINDINGS: CTA CHEST FINDINGS Cardiovascular: Thoracic aorta demonstrates mild atherosclerotic calcifications. No aneurysmal dilatation is seen. No significant opacification is noted. Coronary calcifications are seen. No cardiac enlargement is noted. The pulmonary artery is well visualized with a normal branching pattern bilaterally. No filling defect to suggest pulmonary embolism is noted. Mediastinum/Nodes: Thoracic inlet is within normal limits. No sizable hilar or mediastinal adenopathy is noted. A few small calcified nodes are seen consistent with prior granulomatous disease. The esophagus is within normal limits. Lungs/Pleura: Mild dependent atelectatic changes are noted bilaterally. Some mosaic attenuation in the lungs is noted consistent with air trapping. No focal confluent infiltrate or sizable effusion is seen. No parenchymal nodules are noted. Calcified granuloma is noted in the right lower lobe. Musculoskeletal: Degenerative changes of the thoracic spine are noted. No acute rib abnormality is noted. Review of the MIP images confirms the above findings. CT  ABDOMEN and PELVIS FINDINGS Hepatobiliary: Gallbladder is partially distended with a few small dependent gallstones. No pericholecystic fluid is noted. The liver shows fatty infiltration. Pancreas: Unremarkable. No pancreatic ductal dilatation or surrounding inflammatory changes. Spleen: Normal in size without focal abnormality. Adrenals/Urinary Tract: Adrenal glands are unremarkable bilaterally. Kidneys are well visualized bilaterally with large bilateral renal cysts. The largest of  these on the right measures 8.5 cm. The largest of these on the left measures 13 cm in greatest dimension. Normal enhancement and excretion is seen. Bladder is partially distended. Ureters are within normal limits. No renal calculi are seen. Stomach/Bowel: Scattered diverticular change of the colon is noted. Very mild pericolonic inflammatory changes noted in the proximal sigmoid colon consistent with early diverticulitis. No perforation or abscess formation is noted. The appendix is within normal limits. The remainder of the colon is unremarkable. Stomach is decompressed. Small bowel is unremarkable. Vascular/Lymphatic: Aortic atherosclerosis. No enlarged abdominal or pelvic lymph nodes. Reproductive: Prostate is unremarkable. Other: No abdominal wall hernia or abnormality. No abdominopelvic ascites. Musculoskeletal: Degenerative changes of lumbar spine are noted. No compression deformity is noted. Review of the MIP images confirms the above findings. IMPRESSION: CTA of the chest: No evidence of pulmonary emboli. Mild dependent atelectatic changes. Prior granulomatous disease. Aortic Atherosclerosis (ICD10-I70.0). CT of the abdomen and pelvis: Mild diverticulitis in the sigmoid without evidence of perforation or abscess formation. Cholelithiasis without complicating factors. Bilateral renal cysts as described. No obstructive changes are noted. Electronically Signed   By: Inez Catalina M.D.   On: 04/14/2020 18:09   US Abdomen  Limited  Result Date: 04/14/2020 CLINICAL DATA:  Cholelithiasis. EXAM: ULTRASOUND ABDOMEN LIMITED RIGHT UPPER QUADRANT COMPARISON:  CT abdomen and pelvis 04/14/2020 FINDINGS: Gallbladder: Gallstones measuring up to 7 mm. No gallbladder wall thickening. No sonographic Murphy sign noted by sonographer. Common bile duct: Diameter: 3 mm Liver: Diffusely increased parenchymal echogenicity without a focal lesion identified. Portal vein is patent on color Doppler imaging with normal direction of blood flow towards the liver. Other: Incidental 2.4 cm complex, partially hyperechoic mass in the upper pole of the right kidney which appears solid with some blood flow on color Doppler imaging. IMPRESSION: 1. Cholelithiasis without evidence of cholecystitis. 2. 2.4 cm right upper pole renal mass highly suspicious for neoplasm. Consider nonemergent abdominal MRI without and with contrast (preferably as an outpatient) for further evaluation. 3. Echogenic liver suggestive of steatosis. Electronically Signed   By: Logan Bores M.D.   On: 04/14/2020 20:33    EKG: Independently reviewed.  EKG is reviewed and reads as atrial fibrillation with heart rate of 107.  Reviewing it it looks more to be a sinus tachycardia with nonspecific ST changes with no acute ST elevation or depression.  Patient with PAC. QTc is normal at 448  Assessment/Plan Principal Problem:   Cholangitis  Mr. Arnold will be admitted to medical telemetry floor for further management.  Started on Zosyn for empiric antibiotic with intra-abdominal infection.  Patient does have mild diverticulitis on CT scan without perforation but all of his pain is on the right side in the upper quadrant instead of the left side of the abdomen with a diverticulitis is.  Zosyn will cover for both conditions.  Patient is a high risk surgical candidate due to his coronary artery disease.  Surgery is following and has been consulted.  Appreciate their input. Gastroenterology is  consulted and patient may benefit from ERCP instead of surgery.  Waiting GI evaluation and recommendation Pain control be provided overnight as needed. Antiemetic provided as needed  Active Problems:   Essential hypertension Continue with home antihypertensive medications with metoprolol, lisinopril, HCTZ    Hyperlipidemia associated with type 2 diabetes mellitus ([place]) Statin therapy is held with elevated LFTs.  LFTs in morning    Uncontrolled type 2 diabetes mellitus with hyperglycemia ([place])  Patient had hemoglobin A1c a  few days ago which was 7.  We will continue basal insulin with Lantus.  Sliding scale insulin be provided as needed for glycemic control.  Metformin is held for 48 hours after having a CT angiography earlier today    Coronary artery disease involving native coronary artery of native heart with angina pectoris ([place]) Stable coronary artery disease.  Cardiology is evaluating.  Patient did have cardiac catheterization 2 days ago and results were reviewed.    AKI (acute kidney injury) ([place]) IV fluid hydration with LR at 75 mils per hour overnight. Repeat electrolytes and renal function labs in morning    Obesity (BMI 30-39.9)  Follow with PCP for dietary lifestyle interventions for weight loss.    Fever Tylenol as needed for fever control    DVT prophylaxis: Padua score elevated.  Heparin for DVT prophylaxis Code Status:   Full code Family Communication:  Diagnosis and plan discussed with patient and his wife who is at bedside.  They verbalized understanding and agree with plan.  Questions were answered.  Further recommendations to follow as clinically indicated Disposition Plan:   Patient is from:  Home  Anticipated DC to:  Home  Anticipated DC date:  Anticipate greater than 2 midnight stay in the hospital to treat acute medical condition  Anticipated DC barriers: No barriers to discharge identified at this time  Consults called:  Cardiology, general surgery,  gastroenterology Admission status:  Inpatient  Severity of Illness: The appropriate patient status for this patient is INPATIENT. Inpatient status is judged to be reasonable and necessary in order to provide the required intensity of service to ensure the patient's safety. The patient's presenting symptoms, physical exam findings, and initial radiographic and laboratory data in the context of their chronic comorbidities is felt to place them at high risk for further clinical deterioration. Furthermore, it is not anticipated that the patient will be medically stable for discharge from the hospital within 2 midnights of admission. The following factors support the patient status of inpatient.    * I certify that at the point of admission it is my clinical judgment that the patient will require inpatient hospital care spanning beyond 2 midnights from the point of admission due to high intensity of service, high risk for further deterioration and high frequency of surveillance required.Yevonne Aline Shaaron Golliday MD [facility]  How to contact the [facility] Attending or Consulting provider [place] or covering provider during after hours [place], for this patient?   1. Check the care team in [facility] and look for a) attending/consulting [facility] provider listed and b) the [facility] team listed 2. Log into [email or web address] and use [place]'s universal password to access. If you do not have the password, please contact the hospital operator. 3. Locate the [facility] provider you are looking for under [facility] and page to a number that you can be directly reached. 4. If you still have difficulty reaching the provider, please page the [facility] (Director on Call) for the Hospitalists listed on amion for assistance.  04/14/2020, 8:38 PM

## 2020-04-14 NOTE — ED Provider Notes (Signed)
Ryan Garcia   CSN: [id number] Arrival date & time: 04/14/20  1435     History Chief Complaint  Patient presents with  . Chest Pain  . Fever    Ryan Garcia is a 86 y.o. male with past medical history of CAD status post cath on 04/12/2020, diabetes, hyperlipidemia, hypertension, GERD, OSA on CPAP who presents with right-sided chest pain and fever.  Patient was discharged after his cath 2 days ago.  No stents placed.  He took a 2-hour nap and upon awakening, was found to have a temperature of 103.4.  Temperature has waxed and waned over the last 2 days.  Patient also reports right-sided lateral chest pain that is worse with breathing and coughing.  Denies shortness of breath, nausea, vomiting, abdominal pain.  Denies history of blood clots or blood thinner.  Patient had 3 days of diarrhea with some lower abdominal pain sometime last week when he was camping, but those symptoms have resolved.  No known sick contacts.   Chest Pain Pain location:  R lateral chest Pain radiates to:  Does not radiate Pain severity:  Moderate Duration:  2 days Timing:  Intermittent Chronicity:  New Context: breathing   Relieved by:  None tried Associated symptoms: fever   Associated symptoms: no abdominal pain, no back pain, no cough, no dysphagia, no nausea, no palpitations, no shortness of breath and no vomiting   Fever Associated symptoms: chest pain   Associated symptoms: no chills, no cough, no dysuria, no ear pain, no nausea, no rash, no sore throat and no vomiting        Past Medical History:  Diagnosis Date  . AC (acromioclavicular) joint bone spurs    lt ankle  . Allergy    allergic rhinitis  . Anginal pain ([place])   . Arthritis    OA  . BPH (benign prostatic hyperplasia)    microwave tx of prostate  . CAD (coronary artery disease)    a. s/p cath in 2012 showing 70-75% LAD stenosis, 75% D1 stenosis, 75% OM1, 60-70% RCA stenosis, and  80% PDA --> medical therapy pursued b. similar findings by cath in 2016; 06/2017 DES to distal RCA  . Chronic upper back pain   . Diabetes mellitus    type II x8 years  . GERD (gastroesophageal reflux disease)   . HLD (hyperlipidemia)    10/12: TC 208, TG 213, HDL 36, LDL 129  . Hx of skin cancer, basal cell    many skin cancers removed/under constant treatment.  . Hypertension   . NSTEMI (non-ST elevated myocardial infarction) ([place]) 06/18/2017   DES to distal RCA  . Obstructive sleep apnea on CPAP   . Scoliosis     Patient Active Problem List   Diagnosis Date Noted  . Fever 04/14/2020  . AKI (acute kidney injury) ([place]) 04/14/2020  . Cholangitis 04/14/2020  . Unstable angina ([place])   . Chest pain 04/11/2020  . Left elbow pain 11/18/2019  . Pain and swelling of right lower leg 06/23/2019  . Coronary artery disease involving native coronary artery of native heart with angina pectoris ([place]) 01/18/2019  . Medicare annual wellness visit, subsequent 01/16/2019  . Ecchymosis 10/22/2018  . Obesity (BMI 30-39.9) 07/09/2018  . HOH (hard of hearing) 03/31/2018  . Uncontrolled type 2 diabetes mellitus with hyperglycemia ([place]) 10/28/2017  . Subclinical hypothyroidism 10/28/2017  . Scoliosis   . Obstructive sleep apnea on CPAP   . Hx of skin  cancer, basal cell   . Hyperlipidemia associated with type 2 diabetes mellitus ([place])   . GERD (gastroesophageal reflux disease)   . Chronic upper back pain   . Arthritis   . Allergy   . NSTEMI (non-ST elevated myocardial infarction) ([place]) 06/18/2017  . Actinic keratoses 04/29/2017  . Positive colorectal cancer screening using Cologuard test 10/31/2016  . BPH (benign prostatic hyperplasia) 09/04/2016  . B12 deficiency 11/22/2015  . Routine general medical examination at a health care facility 08/26/2015  . Abnormal nuclear cardiac imaging test   . Dyspnea on exertion 11/27/2013  . Encounter for examination of normal volunteer in research study  05/29/2013  . Prostate cancer screening 07/15/2012  . CAD S/P percutaneous coronary angioplasty 05/17/2011  . Nonspecific abnormal results of cardiovascular function study 04/30/2011  . Abnormal EKG 04/06/2011  . Right low back pain 01/13/2010  . Insulin dependent diabetes mellitus 10/04/2006  . ERECTILE DYSFUNCTION 10/04/2006  . Essential hypertension 10/04/2006  . Allergic rhinitis 10/04/2006  . OSTEOARTHRITIS 10/04/2006  . Sleep apnea 10/04/2006  . EDEMA 10/04/2006  . SKIN CANCER, HX OF 10/04/2006    Past Surgical History:  Procedure Laterality Date  . BREAST SURGERY  2009   breast lump removed benign  . CARDIAC CATHETERIZATION N/A 03/15/2015   Procedure: Left Heart Cath and Coronary Angiography;  Surgeon: Belva Crome, MD;  Location: [facility];  Service: Cardiovascular;  Laterality: N/A;  . CORONARY STENT INTERVENTION N/A 06/18/2017   Procedure: CORONARY STENT INTERVENTION;  Surgeon: Jettie Booze, MD;  Location: [facility];  Service: Cardiovascular;  Laterality: N/A;  RCA  . LEFT HEART CATH AND CORONARY ANGIOGRAPHY N/A 06/18/2017   Procedure: LEFT HEART CATH AND CORONARY ANGIOGRAPHY;  Surgeon: Jettie Booze, MD;  Location: [facility];  Service: Cardiovascular;  Laterality: N/A;  . LEFT HEART CATH AND CORONARY ANGIOGRAPHY N/A 04/12/2020   Procedure: LEFT HEART CATH AND CORONARY ANGIOGRAPHY;  Surgeon: Martinique, Peter M, MD;  Location: [facility];  Service: Cardiovascular;  Laterality: N/A;  . ULTRASOUND GUIDANCE FOR VASCULAR ACCESS  06/18/2017   Procedure: Ultrasound Guidance For Vascular Access;  Surgeon: Jettie Booze, MD;  Location: [facility];  Service: Cardiovascular;;  right radial, right femoral       Family History  Problem Relation Age of Onset  . Heart attack Brother   . Colon cancer Neg Hx     Social History   Tobacco Use  . Smoking status: Never Smoker  . Smokeless tobacco: Never Used  Vaping Use    . Vaping Use: Never used  Substance Use Topics  . Alcohol use: No    Alcohol/week: 0.0 standard drinks  . Drug use: No    Home Medications Prior to Admission medications   Medication Sig Start Date End Date Taking? Authorizing Provider  acetaminophen (TYLENOL) 325 MG tablet Take 650 mg by mouth every 6 (six) hours as needed for mild pain or headache.    Yes [provider]  aspirin 81 MG tablet Take 81 mg by mouth daily.     Yes [provider]  cetirizine (ZYRTEC) 10 MG tablet TAKE 1 TABLET EVERY DAY Patient taking differently: Take 10 mg by mouth daily as needed for allergies.  11/14/16  Yes Tower, Wynelle Fanny, MD  Coenzyme Q10 (CO Q 10 PO) Take 200 mg by mouth daily.   Yes [provider]  Cyanocobalamin (VITAMIN B12) 1000 MCG TBCR Take 1,000 mcg by mouth  daily. 04/12/20  Yes Cheryln Manly, NP  fluticasone (FLONASE) 50 MCG/ACT nasal spray Place 1 spray into both nostrils daily as needed for allergies.    Yes [provider]  glipiZIDE (GLUCOTROL) 10 MG tablet TAKE 1 TABLET TWICE DAILY WITH MEALS Patient taking differently: Take 10 mg by mouth 2 (two) times daily before a meal.  12/15/19  Yes Tower, Marne A, MD  hydrochlorothiazide (HYDRODIURIL) 12.5 MG tablet TAKE 1 TABLET EVERY DAY Patient taking differently: Take 12.5 mg by mouth daily.  03/09/20  Yes Lelon Perla, MD  insulin glargine (LANTUS SOLOSTAR) 100 UNIT/ML Solostar Pen INJECT 60 UNITS SUBCUTANEOUSLY AT BEDTIME Patient taking differently: Inject 50 Units into the skin at bedtime.  01/20/20  Yes Tower, Wynelle Fanny, MD  isosorbide mononitrate (IMDUR) 30 MG 24 hr tablet Take 1 tablet (30 mg total) by mouth daily. 11/26/19  Yes Lelon Perla, MD  lisinopril (ZESTRIL) 20 MG tablet TAKE 1 TABLET EVERY DAY Patient taking differently: Take 20 mg by mouth daily.  11/17/19  Yes Lelon Perla, MD  metFORMIN (GLUCOPHAGE) 1000 MG tablet TAKE 1 TABLET TWICE DAILY Patient taking differently: Take  1,000 mg by mouth 2 (two) times daily with a meal.  03/10/20  Yes Tower, Wynelle Fanny, MD  metoprolol tartrate (LOPRESSOR) 25 MG tablet Take 0.5 tablets (12.5 mg total) by mouth 2 (two) times daily. 11/05/19  Yes Lelon Perla, MD  nitroGLYCERIN (NITROSTAT) 0.4 MG SL tablet Place 1 tablet (0.4 mg total) under the tongue every 5 (five) minutes as needed for chest pain. Patient taking differently: Place 0.4 mg under the tongue every 5 (five) minutes as needed for chest pain (max 3 doses).  05/11/19  Yes Duke, Tami Lin, PA  Blood Glucose Monitoring Suppl (TRUE METRIX AIR GLUCOSE METER) W/DEVICE KIT 1 kit by Other route 2 (two) times daily. Check blood sugar twice daily and as directed. Dx E11.65 03/17/15   Tower, Wynelle Fanny, MD  DROPLET PEN NEEDLES 31G X 6 MM MISC USE ONE TIME DAILY  AT  BEDTIME  WITH  LANTUS 02/10/20   Tower, Wynelle Fanny, MD  rosuvastatin (CRESTOR) 40 MG tablet Take 40 mg by mouth daily. Patient not taking: Reported on 04/14/2020 03/10/20   [provider]  TRUE METRIX BLOOD GLUCOSE TEST test strip CHECK BLOOD SUGAR TWICE DAILY 12/15/19   Tower, Wynelle Fanny, MD  TRUEPLUS LANCETS 30G MISC Check blood sugar twice daily and as directed. Dx E11.65 03/17/15   Abner Greenspan, MD    Allergies    Loratadine and Simvastatin  Review of Systems   Review of Systems  Constitutional: Positive for fever. Negative for chills.  HENT: Negative for ear pain, sore throat and trouble swallowing.   Eyes: Negative for pain and visual disturbance.  Respiratory: Negative for cough and shortness of breath.   Cardiovascular: Positive for chest pain. Negative for palpitations.  Gastrointestinal: Negative for abdominal pain, nausea and vomiting.  Genitourinary: Negative for difficulty urinating, dysuria and hematuria.  Musculoskeletal: Negative for arthralgias and back pain.  Skin: Negative for color change and rash.  Neurological: Negative for seizures and syncope.  All other systems reviewed and are  negative.   Physical Exam Updated Vital Signs BP (!) 147/73 (BP Location: Left Arm)   Pulse 73   Temp 99.4 F (37.4 C) (Oral)   Resp 17   SpO2 97%   Physical Exam Vitals and nursing Garcia reviewed.  Constitutional:      Appearance: He is  well-developed.  HENT:     Head: Normocephalic and atraumatic.  Eyes:     Conjunctiva/sclera: Conjunctivae normal.  Cardiovascular:     Rate and Rhythm: Normal rate and regular rhythm.     Heart sounds: No murmur heard.   Pulmonary:     Effort: Pulmonary effort is normal. No respiratory distress.     Breath sounds: Normal breath sounds.  Chest:     Chest wall: No tenderness.  Abdominal:     Palpations: Abdomen is soft.     Tenderness: There is no abdominal tenderness.  Musculoskeletal:     Cervical back: Neck supple.     Right lower leg: No tenderness. No edema.     Left lower leg: No tenderness. No edema.  Skin:    General: Skin is warm and dry.     Comments: Left radial access site from cath is well-healing and not cellulitic appearing.  Neurological:     General: No focal deficit present.     Mental Status: He is alert and oriented to person, place, and time.     ED Results / Procedures / Treatments   Labs (all labs ordered are listed, but only abnormal results are displayed) Labs Reviewed  BASIC METABOLIC PANEL - Abnormal; Notable for the following components:      Result Value   CO2 21 (*)    Glucose, Bld 351 (*)    BUN 27 (*)    Creatinine, Ser 1.91 (*)    Calcium 8.6 (*)    GFR, Estimated 31 (*)    All other components within normal limits  CBC - Abnormal; Notable for the following components:   Hemoglobin 12.8 (*)    Platelets 144 (*)    All other components within normal limits  HEPATIC FUNCTION PANEL - Abnormal; Notable for the following components:   Total Protein 6.2 (*)    Albumin 3.0 (*)    AST 131 (*)    ALT 322 (*)    Alkaline Phosphatase 214 (*)    Total Bilirubin 2.1 (*)    Bilirubin, Direct 1.0  (*)    Indirect Bilirubin 1.1 (*)    All other components within normal limits  URINALYSIS, ROUTINE W REFLEX MICROSCOPIC - Abnormal; Notable for the following components:   APPearance HAZY (*)    Glucose, UA 150 (*)    Hgb urine dipstick SMALL (*)    Bacteria, UA RARE (*)    All other components within normal limits  CBC - Abnormal; Notable for the following components:   RBC 4.05 (*)    Hemoglobin 12.3 (*)    HCT 36.8 (*)    Platelets 132 (*)    All other components within normal limits  GLUCOSE, CAPILLARY - Abnormal; Notable for the following components:   Glucose-Capillary 177 (*)    All other components within normal limits  RESPIRATORY PANEL BY RT PCR (FLU A&B, COVID)  CULTURE, BLOOD (ROUTINE X 2)  CULTURE, BLOOD (ROUTINE X 2)  URINE CULTURE  LACTIC ACID, PLASMA  LACTIC ACID, PLASMA  LIPASE, BLOOD  COMPREHENSIVE METABOLIC PANEL  TROPONIN I (HIGH SENSITIVITY)  TROPONIN I (HIGH SENSITIVITY)    EKG None  Radiology DG Chest 2 View  Result Date: 04/14/2020 CLINICAL DATA:  Chest pain and fever for 1 day EXAM: CHEST - 2 VIEW COMPARISON:  04/11/2020 FINDINGS: Cardiac shadow is mildly enlarged but stable. The lungs are well aerated bilaterally. Mild atelectatic changes are again noted in the left base laterally. No sizable  effusion is noted. No bony abnormality is seen. IMPRESSION: Stable left basilar atelectasis. Electronically Signed   By: Inez Catalina M.D.   On: 04/14/2020 15:27   CT Angio Chest PE W and/or Wo Contrast  Result Date: 04/14/2020 CLINICAL DATA:  Shortness of breath with fevers and abdominal pain EXAM: CT ANGIOGRAPHY CHEST CT ABDOMEN AND PELVIS WITH CONTRAST TECHNIQUE: Multidetector CT imaging of the chest was performed using the standard protocol during bolus administration of intravenous contrast. Multiplanar CT image reconstructions and MIPs were obtained to evaluate the vascular anatomy. Multidetector CT imaging of the abdomen and pelvis was performed using  the standard protocol during bolus administration of intravenous contrast. CONTRAST:  43m OMNIPAQUE IOHEXOL 350 MG/ML SOLN COMPARISON:  None. FINDINGS: CTA CHEST FINDINGS Cardiovascular: Thoracic aorta demonstrates mild atherosclerotic calcifications. No aneurysmal dilatation is seen. No significant opacification is noted. Coronary calcifications are seen. No cardiac enlargement is noted. The pulmonary artery is well visualized with a normal branching pattern bilaterally. No filling defect to suggest pulmonary embolism is noted. Mediastinum/Nodes: Thoracic inlet is within normal limits. No sizable hilar or mediastinal adenopathy is noted. A few small calcified nodes are seen consistent with prior granulomatous disease. The esophagus is within normal limits. Lungs/Pleura: Mild dependent atelectatic changes are noted bilaterally. Some mosaic attenuation in the lungs is noted consistent with air trapping. No focal confluent infiltrate or sizable effusion is seen. No parenchymal nodules are noted. Calcified granuloma is noted in the right lower lobe. Musculoskeletal: Degenerative changes of the thoracic spine are noted. No acute rib abnormality is noted. Review of the MIP images confirms the above findings. CT ABDOMEN and PELVIS FINDINGS Hepatobiliary: Gallbladder is partially distended with a few small dependent gallstones. No pericholecystic fluid is noted. The liver shows fatty infiltration. Pancreas: Unremarkable. No pancreatic ductal dilatation or surrounding inflammatory changes. Spleen: Normal in size without focal abnormality. Adrenals/Urinary Tract: Adrenal glands are unremarkable bilaterally. Kidneys are well visualized bilaterally with large bilateral renal cysts. The largest of these on the right measures 8.5 cm. The largest of these on the left measures 13 cm in greatest dimension. Normal enhancement and excretion is seen. Bladder is partially distended. Ureters are within normal limits. No renal calculi  are seen. Stomach/Bowel: Scattered diverticular change of the colon is noted. Very mild pericolonic inflammatory changes noted in the proximal sigmoid colon consistent with early diverticulitis. No perforation or abscess formation is noted. The appendix is within normal limits. The remainder of the colon is unremarkable. Stomach is decompressed. Small bowel is unremarkable. Vascular/Lymphatic: Aortic atherosclerosis. No enlarged abdominal or pelvic lymph nodes. Reproductive: Prostate is unremarkable. Other: No abdominal wall hernia or abnormality. No abdominopelvic ascites. Musculoskeletal: Degenerative changes of lumbar spine are noted. No compression deformity is noted. Review of the MIP images confirms the above findings. IMPRESSION: CTA of the chest: No evidence of pulmonary emboli. Mild dependent atelectatic changes. Prior granulomatous disease. Aortic Atherosclerosis (ICD10-I70.0). CT of the abdomen and pelvis: Mild diverticulitis in the sigmoid without evidence of perforation or abscess formation. Cholelithiasis without complicating factors. Bilateral renal cysts as described. No obstructive changes are noted. Electronically Signed   By: MInez CatalinaM.D.   On: 04/14/2020 18:09   CT ABDOMEN PELVIS W CONTRAST  Result Date: 04/14/2020 CLINICAL DATA:  Shortness of breath with fevers and abdominal pain EXAM: CT ANGIOGRAPHY CHEST CT ABDOMEN AND PELVIS WITH CONTRAST TECHNIQUE: Multidetector CT imaging of the chest was performed using the standard protocol during bolus administration of intravenous contrast. Multiplanar CT image reconstructions and  MIPs were obtained to evaluate the vascular anatomy. Multidetector CT imaging of the abdomen and pelvis was performed using the standard protocol during bolus administration of intravenous contrast. CONTRAST:  26m OMNIPAQUE IOHEXOL 350 MG/ML SOLN COMPARISON:  None. FINDINGS: CTA CHEST FINDINGS Cardiovascular: Thoracic aorta demonstrates mild atherosclerotic  calcifications. No aneurysmal dilatation is seen. No significant opacification is noted. Coronary calcifications are seen. No cardiac enlargement is noted. The pulmonary artery is well visualized with a normal branching pattern bilaterally. No filling defect to suggest pulmonary embolism is noted. Mediastinum/Nodes: Thoracic inlet is within normal limits. No sizable hilar or mediastinal adenopathy is noted. A few small calcified nodes are seen consistent with prior granulomatous disease. The esophagus is within normal limits. Lungs/Pleura: Mild dependent atelectatic changes are noted bilaterally. Some mosaic attenuation in the lungs is noted consistent with air trapping. No focal confluent infiltrate or sizable effusion is seen. No parenchymal nodules are noted. Calcified granuloma is noted in the right lower lobe. Musculoskeletal: Degenerative changes of the thoracic spine are noted. No acute rib abnormality is noted. Review of the MIP images confirms the above findings. CT ABDOMEN and PELVIS FINDINGS Hepatobiliary: Gallbladder is partially distended with a few small dependent gallstones. No pericholecystic fluid is noted. The liver shows fatty infiltration. Pancreas: Unremarkable. No pancreatic ductal dilatation or surrounding inflammatory changes. Spleen: Normal in size without focal abnormality. Adrenals/Urinary Tract: Adrenal glands are unremarkable bilaterally. Kidneys are well visualized bilaterally with large bilateral renal cysts. The largest of these on the right measures 8.5 cm. The largest of these on the left measures 13 cm in greatest dimension. Normal enhancement and excretion is seen. Bladder is partially distended. Ureters are within normal limits. No renal calculi are seen. Stomach/Bowel: Scattered diverticular change of the colon is noted. Very mild pericolonic inflammatory changes noted in the proximal sigmoid colon consistent with early diverticulitis. No perforation or abscess formation is  noted. The appendix is within normal limits. The remainder of the colon is unremarkable. Stomach is decompressed. Small bowel is unremarkable. Vascular/Lymphatic: Aortic atherosclerosis. No enlarged abdominal or pelvic lymph nodes. Reproductive: Prostate is unremarkable. Other: No abdominal wall hernia or abnormality. No abdominopelvic ascites. Musculoskeletal: Degenerative changes of lumbar spine are noted. No compression deformity is noted. Review of the MIP images confirms the above findings. IMPRESSION: CTA of the chest: No evidence of pulmonary emboli. Mild dependent atelectatic changes. Prior granulomatous disease. Aortic Atherosclerosis (ICD10-I70.0). CT of the abdomen and pelvis: Mild diverticulitis in the sigmoid without evidence of perforation or abscess formation. Cholelithiasis without complicating factors. Bilateral renal cysts as described. No obstructive changes are noted. Electronically Signed   By: MInez CatalinaM.D.   On: 04/14/2020 18:09   UKoreaAbdomen Limited  Result Date: 04/14/2020 CLINICAL DATA:  Cholelithiasis. EXAM: ULTRASOUND ABDOMEN LIMITED RIGHT UPPER QUADRANT COMPARISON:  CT abdomen and pelvis 04/14/2020 FINDINGS: Gallbladder: Gallstones measuring up to 7 mm. No gallbladder wall thickening. No sonographic Murphy sign noted by sonographer. Common bile duct: Diameter: 3 mm Liver: Diffusely increased parenchymal echogenicity without a focal lesion identified. Portal vein is patent on color Doppler imaging with normal direction of blood flow towards the liver. Other: Incidental 2.4 cm complex, partially hyperechoic mass in the upper pole of the right kidney which appears solid with some blood flow on color Doppler imaging. IMPRESSION: 1. Cholelithiasis without evidence of cholecystitis. 2. 2.4 cm right upper pole renal mass highly suspicious for neoplasm. Consider nonemergent abdominal MRI without and with contrast (preferably as an outpatient) for further evaluation. 3. Echogenic  liver  suggestive of steatosis. Electronically Signed   By: Logan Bores M.D.   On: 04/14/2020 20:33   MR ABDOMEN MRCP W WO CONTAST  Result Date: 04/14/2020 CLINICAL DATA:  Cholelithiasis EXAM: MRI ABDOMEN WITHOUT AND WITH CONTRAST (INCLUDING MRCP) TECHNIQUE: Multiplanar multisequence MR imaging of the abdomen was performed both before and after the administration of intravenous contrast. Heavily T2-weighted images of the biliary and pancreatic ducts were obtained, and three-dimensional MRCP images were rendered by post processing. CONTRAST:  9.33m GADAVIST GADOBUTROL 1 MMOL/ML IV SOLN COMPARISON:  CT and ultrasound from earlier in the same day. FINDINGS: Lower chest: No focal infiltrate or sizable effusion is noted. Hepatobiliary: Tiny cyst is noted along the anterior margin of the right lobe of the liver measuring less than 1 cm better visualized than on the prior CT examination. Hypointense area is noted in the tip of the right lobe of the liver better visualized than on prior CT which demonstrates some peripheral enhancement and filling in on delayed post gadolinium images consistent with a small hemangioma. This measures approximate with 1.4 cm in dimension. No other areas of abnormal enhancement are noted within the liver. Gallbladder is well visualized and demonstrates dependent cholelithiasis similar to that seen on prior CT and ultrasound. The biliary tree is within normal limits. No findings to suggest choledocholithiasis are seen. Pancreas:  Pancreas is well visualized and within normal limits. Spleen:  Within normal limits in size and appearance. Adrenals/Urinary Tract: Adrenal glands are within normal limits. Multiple renal cysts are again identified similar to that seen on prior CT examination. Hypointense lesion is noted in the upper pole of right kidney measuring approximately 2.6 cm. Following contrast administration there is irregular enhancement identified consistent with a renal cell neoplasm. No  obstructive changes are seen. Bladder is not visualized on these images. Stomach/Bowel: There is some mild inflammatory change surrounding the second portion the duodenum better visualized than on the prior CT examination. This is consistent with duodenitis. No findings to suggest perforation are noted. Small duodenal diverticulum is noted with fluid within adjacent to the ampulla of Vater. The remainder of the visualized small bowel appears within normal limits. Visualized colon is unremarkable. The previously seen inflammatory changes in the sigmoid are not evaluated on this exam. Vascular/Lymphatic: No aneurysmal dilatation is seen. No significant lymphadenopathy is noted. Other:  No free fluid is noted. Musculoskeletal: Visualized bony structures show no acute abnormality. IMPRESSION: Changes consistent with cholelithiasis. No choledocholithiasis is seen. No gallbladder inflammatory changes noted. Thickening and mild inflammatory changes in the second portion the duodenum consistent with duodenitis. A small duodenal diverticulum is identified with fluid within. No definitive perforation is seen. This may contribute to the patient's elevated LFTs although no biliary ductal dilatation is seen. 2.6 cm enhancing mass lesion in the upper pole of the right kidney as described consistent with renal neoplasm. Small hepatic cyst and small hepatic hemangioma. Electronically Signed   By: MInez CatalinaM.D.   On: 04/14/2020 23:17    Procedures Procedures (including critical care time)  Medications Ordered in ED Medications  piperacillin-tazobactam (ZOSYN) IVPB 3.375 g (3.375 g Intravenous New Bag/Given 04/14/20 2332)  aspirin EC tablet 81 mg (has no administration in time range)  isosorbide mononitrate (IMDUR) 24 hr tablet 30 mg (30 mg Oral Not Given 04/14/20 2307)  lisinopril (ZESTRIL) tablet 20 mg (20 mg Oral Not Given 04/14/20 2306)  metoprolol tartrate (LOPRESSOR) tablet 25 mg (25 mg Oral Given 04/14/20 2111)   nitroGLYCERIN (NITROSTAT) SL  tablet 0.4 mg (has no administration in time range)  insulin glargine (LANTUS) injection 50 Units (has no administration in time range)  heparin injection 5,000 Units (5,000 Units Subcutaneous Given 04/14/20 2333)  lactated ringers infusion ( Intravenous New Bag/Given 04/14/20 2317)  acetaminophen (TYLENOL) tablet 650 mg (has no administration in time range)    Or  acetaminophen (TYLENOL) suppository 650 mg (has no administration in time range)  ondansetron (ZOFRAN) tablet 4 mg (has no administration in time range)    Or  ondansetron (ZOFRAN) injection 4 mg (has no administration in time range)  insulin aspart (novoLOG) injection 0-15 Units (has no administration in time range)  insulin aspart (novoLOG) injection 0-5 Units (0 Units Subcutaneous Not Given 04/14/20 2313)  iohexol (OMNIPAQUE) 350 MG/ML injection 70 mL (70 mLs Intravenous Contrast Given 04/14/20 1730)  lactated ringers bolus 1,000 mL (1,000 mLs Intravenous New Bag/Given 04/14/20 1951)  gadobutrol (GADAVIST) 1 MMOL/ML injection 9.5 mL (9.5 mLs Intravenous Contrast Given 04/14/20 2241)    ED Course  I have reviewed the triage vital signs and the nursing notes.  Pertinent labs & imaging results that were available during my care of the patient were reviewed by me and considered in my medical decision making (see chart for details).    MDM Rules/Calculators/A&P                          EKG with atrial fibrillation and T wave abnormalities in inferior leads, no prior EKGs, including one from 2 days ago.  Initial troponin 14, repeat 13.  No leukocytosis.  BMP with creatinine 1.9, up from 1.45 2 days ago.  CO2 21, unchanged from 2 days ago.  Hepatic function panel shows elevations in AST, ALT, alk phos as well as bilirubin. Lipase unremarkable.  Lactic acid within normal limits.  CTA without PE.  CT abdomen pelvis showed mild diverticulitis in the sigmoid and cholelithiasis.  Ultrasound ordered due  to continued concern for acute cholecystitis.  Discussed with Dr. Dema Severin.  He recommended medicine admission with cardiology consult for operative risk and GI consult for elevated T bili, which may necessitate MRCP.  Patient given Zosyn and admitted to medicine service.  This patient was seen with Dr. Billy Fischer. Final Clinical Impression(s) / ED Diagnoses Final diagnoses:  Cholangitis    Rx / DC Orders ED Discharge Orders    None       Asencion Noble, MD 04/15/20 1540    Gareth Morgan, MD 04/19/20 1440

## 2020-04-14 NOTE — ED Triage Notes (Addendum)
Patient had heart cath on the 11th, access left radial. Left hospital on the 12th--wife states he had chills then. Has spoken with cards-suggest UC for eval. Does not think heart related per wife.   Patient reports he is feeling "funky". Last does of tylenol last night before bed.   Left wrist looks normal--no signs of infection. Resp panel neg on the 11th.

## 2020-04-14 NOTE — Telephone Encounter (Signed)
Aware ?Will watch for correspondence  ?

## 2020-04-14 NOTE — ED Provider Notes (Signed)
Roderic Palau    CSN: [id number] Arrival date & time: 04/14/20  1102      History   Chief Complaint Chief Complaint  Patient presents with  . Fever    HPI MAHIR PRABHAKAR is a 86 y.o. male.   Accompanied by his wife, patient presents with fever and pain in his right lower lateral ribcage x 3 days.  T-max 103.4 last night.  Patient reports malaise, diaphoresis, chills.  The pain in his right rib cage is worse with deep breaths.  Treatment at home with Tylenol.  He had a heart catheterization on 04/12/2020.  He denies chest pain, shortness of breath, abdominal pain, focal weakness, or other symptoms.  His medical history includes hypertension, CAD, NSTEMI, diabetes.  The history is provided by the patient.    Past Medical History:  Diagnosis Date  . AC (acromioclavicular) joint bone spurs    lt ankle  . Allergy    allergic rhinitis  . Arthritis    OA  . BPH (benign prostatic hyperplasia)    microwave tx of prostate  . CAD (coronary artery disease)    a. s/p cath in 2012 showing 70-75% LAD stenosis, 75% D1 stenosis, 75% OM1, 60-70% RCA stenosis, and 80% PDA --> medical therapy pursued b. similar findings by cath in 2016; 06/2017 DES to distal RCA  . Chronic upper back pain   . Diabetes mellitus    type II x8 years  . GERD (gastroesophageal reflux disease)   . HLD (hyperlipidemia)    10/12: TC 208, TG 213, HDL 36, LDL 129  . Hx of skin cancer, basal cell    many skin cancers removed/under constant treatment.  . Hypertension   . NSTEMI (non-ST elevated myocardial infarction) ([place]) 06/18/2017   DES to distal RCA  . Obstructive sleep apnea on CPAP   . Scoliosis     Patient Active Problem List   Diagnosis Date Noted  . Unstable angina ([place])   . Chest pain 04/11/2020  . Left elbow pain 11/18/2019  . Pain and swelling of right lower leg 06/23/2019  . Coronary artery disease involving native coronary artery of native heart with angina pectoris ([place]) 01/18/2019  .  Medicare annual wellness visit, subsequent 01/16/2019  . Ecchymosis 10/22/2018  . Obesity (BMI 30-39.9) 07/09/2018  . HOH (hard of hearing) 03/31/2018  . Uncontrolled type 2 diabetes mellitus with hyperglycemia ([place]) 10/28/2017  . Subclinical hypothyroidism 10/28/2017  . Scoliosis   . Obstructive sleep apnea on CPAP   . Hx of skin cancer, basal cell   . Hyperlipidemia associated with type 2 diabetes mellitus ([place])   . GERD (gastroesophageal reflux disease)   . Chronic upper back pain   . Arthritis   . Allergy   . NSTEMI (non-ST elevated myocardial infarction) ([place]) 06/18/2017  . Actinic keratoses 04/29/2017  . Positive colorectal cancer screening using Cologuard test 10/31/2016  . BPH (benign prostatic hyperplasia) 09/04/2016  . B12 deficiency 11/22/2015  . Routine general medical examination at a health care facility 08/26/2015  . Abnormal nuclear cardiac imaging test   . Dyspnea on exertion 11/27/2013  . Encounter for examination of normal volunteer in research study 05/29/2013  . Prostate cancer screening 07/15/2012  . CAD S/P percutaneous coronary angioplasty 05/17/2011  . Nonspecific abnormal results of cardiovascular function study 04/30/2011  . Abnormal EKG 04/06/2011  . Right low back pain 01/13/2010  . Insulin dependent diabetes mellitus 10/04/2006  . ERECTILE DYSFUNCTION 10/04/2006  . Essential hypertension 10/04/2006  .  Allergic rhinitis 10/04/2006  . OSTEOARTHRITIS 10/04/2006  . Sleep apnea 10/04/2006  . EDEMA 10/04/2006  . SKIN CANCER, HX OF 10/04/2006    Past Surgical History:  Procedure Laterality Date  . BREAST SURGERY  2009   breast lump removed benign  . CARDIAC CATHETERIZATION N/A 03/15/2015   Procedure: Left Heart Cath and Coronary Angiography;  Surgeon: Belva Crome, MD;  Location: [facility];  Service: Cardiovascular;  Laterality: N/A;  . CORONARY STENT INTERVENTION N/A 06/18/2017   Procedure: CORONARY STENT INTERVENTION;  Surgeon: Jettie Booze, MD;  Location: [facility];  Service: Cardiovascular;  Laterality: N/A;  RCA  . LEFT HEART CATH AND CORONARY ANGIOGRAPHY N/A 06/18/2017   Procedure: LEFT HEART CATH AND CORONARY ANGIOGRAPHY;  Surgeon: Jettie Booze, MD;  Location: [facility];  Service: Cardiovascular;  Laterality: N/A;  . LEFT HEART CATH AND CORONARY ANGIOGRAPHY N/A 04/12/2020   Procedure: LEFT HEART CATH AND CORONARY ANGIOGRAPHY;  Surgeon: Martinique, Peter M, MD;  Location: [facility];  Service: Cardiovascular;  Laterality: N/A;  . ULTRASOUND GUIDANCE FOR VASCULAR ACCESS  06/18/2017   Procedure: Ultrasound Guidance For Vascular Access;  Surgeon: Jettie Booze, MD;  Location: [facility];  Service: Cardiovascular;;  right radial, right femoral       Home Medications    Prior to Admission medications   Medication Sig Start Date End Date Taking? Authorizing Provider  aspirin 81 MG tablet Take 81 mg by mouth daily.      [provider]  Blood Glucose Monitoring Suppl (TRUE METRIX AIR GLUCOSE METER) W/DEVICE KIT 1 kit by Other route 2 (two) times daily. Check blood sugar twice daily and as directed. Dx E11.65 03/17/15   Tower, Wynelle Fanny, MD  cetirizine (ZYRTEC) 10 MG tablet TAKE 1 TABLET EVERY DAY Patient taking differently: Take 10 mg by mouth daily as needed for allergies.  11/14/16   Tower, Wynelle Fanny, MD  Coenzyme Q10 (CO Q 10 PO) Take 200 mg by mouth daily.    [provider]  Cyanocobalamin (VITAMIN B12) 1000 MCG TBCR Take 1,000 mcg by mouth daily. 04/12/20   Cheryln Manly, NP  DROPLET PEN NEEDLES 31G X 6 MM MISC USE ONE TIME DAILY  AT  BEDTIME  WITH  LANTUS 02/10/20   Tower, Wynelle Fanny, MD  fluticasone (FLONASE) 50 MCG/ACT nasal spray Place 1 spray into both nostrils daily as needed for allergies.     [provider]  glipiZIDE (GLUCOTROL) 10 MG tablet TAKE 1 TABLET TWICE DAILY WITH MEALS 12/15/19   Tower, [place] A, MD  hydrochlorothiazide  (HYDRODIURIL) 12.5 MG tablet TAKE 1 TABLET EVERY DAY 03/09/20   Lelon Perla, MD  insulin glargine (LANTUS SOLOSTAR) 100 UNIT/ML Solostar Pen INJECT 60 UNITS SUBCUTANEOUSLY AT BEDTIME Patient taking differently: Inject 50 Units into the skin at bedtime. INJECT 50 UNITS SUBCUTANEOUSLY AT BEDTIME 01/20/20   Tower, Wynelle Fanny, MD  isosorbide mononitrate (IMDUR) 30 MG 24 hr tablet Take 1 tablet (30 mg total) by mouth daily. 11/26/19   Lelon Perla, MD  lisinopril (ZESTRIL) 20 MG tablet TAKE 1 TABLET EVERY DAY 11/17/19   Lelon Perla, MD  metFORMIN (GLUCOPHAGE) 1000 MG tablet TAKE 1 TABLET TWICE DAILY 03/10/20   Tower, Wynelle Fanny, MD  metoprolol tartrate (LOPRESSOR) 25 MG tablet Take 0.5 tablets (12.5 mg total) by mouth 2 (two) times daily. 11/05/19   Lelon Perla, MD  nitroGLYCERIN (NITROSTAT) 0.4 MG  SL tablet Place 1 tablet (0.4 mg total) under the tongue every 5 (five) minutes as needed for chest pain. 05/11/19   D[facility], Tami Lin, PA  rosuvastatin (CRESTOR) 40 MG tablet Take 40 mg by mouth daily. 03/10/20   [provider]  TRUE METRIX BLOOD GLUCOSE TEST test strip CHECK BLOOD SUGAR TWICE DAILY 12/15/19   Tower, Wynelle Fanny, MD  TRUEPLUS LANCETS 30G MISC Check blood sugar twice daily and as directed. Dx E11.65 03/17/15   Abner Greenspan, MD    Family History Family History  Problem Relation Age of Onset  . Heart attack Brother   . Colon cancer Neg Hx     Social History Social History   Tobacco Use  . Smoking status: Never Smoker  . Smokeless tobacco: Never Used  Vaping Use  . Vaping Use: Never used  Substance Use Topics  . Alcohol use: No    Alcohol/week: 0.0 standard drinks  . Drug use: No     Allergies   Loratadine and Simvastatin   Review of Systems Review of Systems  Constitutional: Positive for chills, fatigue and fever.  HENT: Negative for ear pain and sore throat.   Eyes: Negative for pain and visual disturbance.  Respiratory: Negative for cough and shortness  of breath.   Cardiovascular: Negative for chest pain and palpitations.  Gastrointestinal: Negative for abdominal pain and vomiting.  Genitourinary: Negative for dysuria and hematuria.  Musculoskeletal: Negative for arthralgias and back pain.  Skin: Negative for color change and rash.  Neurological: Negative for seizures and syncope.  All other systems reviewed and are negative.    Physical Exam Triage Vital Signs ED Triage Vitals [04/14/20 1141]  Enc Vitals Group     BP 126/67     Pulse Rate 93     Resp 17     Temp (!) 100.8 F (38.2 C)     Temp Source Oral     SpO2 94 %     Weight      Height      Head Circumference      Peak Flow      Pain Score 0     Pain Loc      Pain Edu?      Excl. in [place]?    No data found.  Updated Vital Signs BP 126/67 (BP Location: Right Arm)   Pulse 93   Temp (!) 100.8 F (38.2 C) (Oral)   Resp 17   SpO2 94%   Visual Acuity Right Eye Distance:   Left Eye Distance:   Bilateral Distance:    Right Eye Near:   Left Eye Near:    Bilateral Near:     Physical Exam Vitals and nursing note reviewed.  Constitutional:      General: He is not in acute distress.    Appearance: He is well-developed. He is ill-appearing.  HENT:     Head: Normocephalic and atraumatic.     Mouth/Throat:     Mouth: Mucous membranes are moist.  Eyes:     Conjunctiva/sclera: Conjunctivae normal.  Cardiovascular:     Rate and Rhythm: Normal rate and regular rhythm.     Heart sounds: No murmur heard.   Pulmonary:     Effort: Pulmonary effort is normal. No respiratory distress.     Breath sounds: Normal breath sounds.     Comments: Breath sounds diminished.  No respiratory distress. Chest:    Abdominal:     Palpations: Abdomen is soft.  Tenderness: There is no abdominal tenderness.  Musculoskeletal:     Cervical back: Neck supple.  Skin:    General: Skin is warm and dry.     Comments: Incision on left wrist appears to be healing well; no erythema or  drainage.  Neurological:     Mental Status: He is alert and oriented to person, place, and time. Mental status is at baseline.  Psychiatric:        Mood and Affect: Mood normal.        Behavior: Behavior normal.      UC Treatments / Results  Labs (all labs ordered are listed, but only abnormal results are displayed) Labs Reviewed - No data to display  EKG   Radiology No results found.  Procedures Procedures (including critical care time)  Medications Ordered in UC Medications - No data to display  Initial Impression / Assessment and Plan / UC Course  I have reviewed the triage vital signs and the nursing notes.  Pertinent labs & imaging results that were available during my care of the patient were reviewed by me and considered in my medical decision making (see chart for details).   Fever, right-sided chest wall pain.  Patient is ill-appearing.  Sending him to the ED for evaluation.  Patient and his wife would like to go to [facility] ED in [place] because he had his catheterization there.  They decline EMS transport to local hospital.   Final Clinical Impressions(s) / UC Diagnoses   Final diagnoses:  Fever, unspecified  Right-sided chest wall pain     Discharge Instructions     Go to the Emergency Department for evaluation of your fever and right side pain.        ED Prescriptions    None     PDMP not reviewed this encounter.   Sharion Balloon, NP 04/14/20 1226

## 2020-04-14 NOTE — Consult Note (Addendum)
Cardiology Consultation:   Patient ID: Ryan Garcia; [id number]; 1934-03-13   Admit date: 04/14/2020 Date of Consult: 04/14/2020  Primary Care Provider: Abner Greenspan, MD Primary Cardiologist: Kirk Ruths, MD 09/08/2019 Primary Electrophysiologist:  None   Patient Profile:   Ryan Garcia is a 86 y.o. male with a hx of CAD with 3V disease treated medically after cath 2012 and 2016 (wanted to avoid CABG), NSTEMI 06/2017 with RCA DES, OSA on CPAP, HTN, DM2, chronic DOE, occasional pedal edema who is being seen today for preop evaluation for possible cholecystectomy at the request of Dr Billy Fischer.   History of Present Illness:   Ryan Garcia developed diarrhea and lower abdominal pain last weekend. His lower GI sx resolved, but he developed CP, upper abd/lower third of sternum. It was cramping and pressure, his usual angina. Cath w/ med rx for essentially unchanged dz. D/c 10-12.  Prior to d/c, he was having R rib pain and RUQ pain, but did not tell anyone. He was having chills as well.   After going home, he developed fevers. The fevers were intermittent and that is why he came back to the hospital.   Currently, his RUQ is painful.  Currently not having any chest pain.   No LE edema, no orthopnea or PND.   No palpitations, no awareness that his HR is irregular.    Past Medical History:  Diagnosis Date  . AC (acromioclavicular) joint bone spurs    lt ankle  . Allergy    allergic rhinitis  . Arthritis    OA  . BPH (benign prostatic hyperplasia)    microwave tx of prostate  . CAD (coronary artery disease)    a. s/p cath in 2012 showing 70-75% LAD stenosis, 75% D1 stenosis, 75% OM1, 60-70% RCA stenosis, and 80% PDA --> medical therapy pursued b. similar findings by cath in 2016; 06/2017 DES to distal RCA  . Chronic upper back pain   . Diabetes mellitus    type II x8 years  . GERD (gastroesophageal reflux disease)   . HLD (hyperlipidemia)    10/12: TC 208, TG 213, HDL  36, LDL 129  . Hx of skin cancer, basal cell    many skin cancers removed/under constant treatment.  . Hypertension   . NSTEMI (non-ST elevated myocardial infarction) ([place]) 06/18/2017   DES to distal RCA  . Obstructive sleep apnea on CPAP   . Scoliosis     Past Surgical History:  Procedure Laterality Date  . BREAST SURGERY  2009   breast lump removed benign  . CARDIAC CATHETERIZATION N/A 03/15/2015   Procedure: Left Heart Cath and Coronary Angiography;  Surgeon: Belva Crome, MD;  Location: [facility];  Service: Cardiovascular;  Laterality: N/A;  . CORONARY STENT INTERVENTION N/A 06/18/2017   Procedure: CORONARY STENT INTERVENTION;  Surgeon: Jettie Booze, MD;  Location: [facility];  Service: Cardiovascular;  Laterality: N/A;  RCA  . LEFT HEART CATH AND CORONARY ANGIOGRAPHY N/A 06/18/2017   Procedure: LEFT HEART CATH AND CORONARY ANGIOGRAPHY;  Surgeon: Jettie Booze, MD;  Location: [facility];  Service: Cardiovascular;  Laterality: N/A;  . LEFT HEART CATH AND CORONARY ANGIOGRAPHY N/A 04/12/2020   Procedure: LEFT HEART CATH AND CORONARY ANGIOGRAPHY;  Surgeon: Martinique, Peter M, MD;  Location: [facility];  Service: Cardiovascular;  Laterality: N/A;  . ULTRASOUND GUIDANCE FOR VASCULAR ACCESS  06/18/2017   Procedure: Ultrasound Guidance For Vascular Access;  Surgeon: Larae Grooms  S, MD;  Location: [facility];  Service: Cardiovascular;;  right radial, right femoral     Prior to Admission medications   Medication Sig Start Date End Date Taking? Authorizing Provider  acetaminophen (TYLENOL) 325 MG tablet Take 650 mg by mouth every 6 (six) hours as needed for mild pain or headache.    Yes [provider]  aspirin 81 MG tablet Take 81 mg by mouth daily.     Yes [provider]  cetirizine (ZYRTEC) 10 MG tablet TAKE 1 TABLET EVERY DAY Patient taking differently: Take 10 mg by mouth daily as needed for allergies.  11/14/16   Yes Tower, Wynelle Fanny, MD  Coenzyme Q10 (CO Q 10 PO) Take 200 mg by mouth daily.   Yes [provider]  Cyanocobalamin (VITAMIN B12) 1000 MCG TBCR Take 1,000 mcg by mouth daily. 04/12/20  Yes Cheryln Manly, NP  fluticasone (FLONASE) 50 MCG/ACT nasal spray Place 1 spray into both nostrils daily as needed for allergies.    Yes [provider]  glipiZIDE (GLUCOTROL) 10 MG tablet TAKE 1 TABLET TWICE DAILY WITH MEALS Patient taking differently: Take 10 mg by mouth 2 (two) times daily before a meal.  12/15/19  Yes Tower, Marne A, MD  hydrochlorothiazide (HYDRODIURIL) 12.5 MG tablet TAKE 1 TABLET EVERY DAY Patient taking differently: Take 12.5 mg by mouth daily.  03/09/20  Yes Lelon Perla, MD  insulin glargine (LANTUS SOLOSTAR) 100 UNIT/ML Solostar Pen INJECT 60 UNITS SUBCUTANEOUSLY AT BEDTIME Patient taking differently: Inject 50 Units into the skin at bedtime.  01/20/20  Yes Tower, Wynelle Fanny, MD  isosorbide mononitrate (IMDUR) 30 MG 24 hr tablet Take 1 tablet (30 mg total) by mouth daily. 11/26/19  Yes Lelon Perla, MD  lisinopril (ZESTRIL) 20 MG tablet TAKE 1 TABLET EVERY DAY Patient taking differently: Take 20 mg by mouth daily.  11/17/19  Yes Lelon Perla, MD  metFORMIN (GLUCOPHAGE) 1000 MG tablet TAKE 1 TABLET TWICE DAILY Patient taking differently: Take 1,000 mg by mouth 2 (two) times daily with a meal.  03/10/20  Yes Tower, Wynelle Fanny, MD  metoprolol tartrate (LOPRESSOR) 25 MG tablet Take 0.5 tablets (12.5 mg total) by mouth 2 (two) times daily. 11/05/19  Yes Lelon Perla, MD  nitroGLYCERIN (NITROSTAT) 0.4 MG SL tablet Place 1 tablet (0.4 mg total) under the tongue every 5 (five) minutes as needed for chest pain. Patient taking differently: Place 0.4 mg under the tongue every 5 (five) minutes as needed for chest pain (max 3 doses).  05/11/19  Yes Duke, Tami Lin, PA  Blood Glucose Monitoring Suppl (TRUE METRIX AIR GLUCOSE METER) W/DEVICE KIT 1 kit by Other route 2  (two) times daily. Check blood sugar twice daily and as directed. Dx E11.65 03/17/15   Tower, Wynelle Fanny, MD  DROPLET PEN NEEDLES 31G X 6 MM MISC USE ONE TIME DAILY  AT  BEDTIME  WITH  LANTUS 02/10/20   Tower, Wynelle Fanny, MD  rosuvastatin (CRESTOR) 40 MG tablet Take 40 mg by mouth daily. Patient not taking: Reported on 04/14/2020 03/10/20   [provider]  TRUE METRIX BLOOD GLUCOSE TEST test strip CHECK BLOOD SUGAR TWICE DAILY 12/15/19   Tower, Wynelle Fanny, MD  TRUEPLUS LANCETS 30G MISC Check blood sugar twice daily and as directed. Dx E11.65 03/17/15   Abner Greenspan, MD    Inpatient Medications: Scheduled Meds:  Continuous Infusions: . piperacillin-tazobactam (ZOSYN)  IV     PRN Meds:  Allergies:    Allergies  Allergen Reactions  . Loratadine     Not effective  . Simvastatin     Intolerant, joint pain    Social History:   Social History   Socioeconomic History  . Marital status: Married    Spouse name: Not on file  . Number of children: Not on file  . Years of education: Not on file  . Highest education level: Not on file  Occupational History  . Not on file  Tobacco Use  . Smoking status: Never Smoker  . Smokeless tobacco: Never Used  Vaping Use  . Vaping Use: Never used  Substance and Sexual Activity  . Alcohol use: No    Alcohol/week: 0.0 standard drinks  . Drug use: No  . Sexual activity: Not Currently  Other Topics Concern  . Not on file  Social History Narrative   Married    Physically active hard of hearing   Social Determinants of Health   Financial Resource Strain:   . Difficulty of Paying Living Expenses: Not on file  Food Insecurity:   . Worried About Charity fundraiser in the Last Year: Not on file  . Ran Out of Food in the Last Year: Not on file  Transportation Needs:   . Lack of Transportation (Medical): Not on file  . Lack of Transportation (Non-Medical): Not on file  Physical Activity:   . Days of Exercise per Week: Not on file  . Minutes  of Exercise per Session: Not on file  Stress:   . Feeling of Stress : Not on file  Social Connections:   . Frequency of Communication with Friends and Family: Not on file  . Frequency of Social Gatherings with Friends and Family: Not on file  . Attends Religious Services: Not on file  . Active Member of Clubs or Organizations: Not on file  . Attends Archivist Meetings: Not on file  . Marital Status: Not on file  Intimate Partner Violence:   . Fear of Current or Ex-Partner: Not on file  . Emotionally Abused: Not on file  . Physically Abused: Not on file  . Sexually Abused: Not on file    Family History:   Family History  Problem Relation Age of Onset  . Heart attack Brother   . Colon cancer Neg Hx    Family Status:  Family Status  Relation Name Status  . Mother  Deceased  . Father  Deceased  . Brother  Deceased  . MGM  Deceased  . MGF  Deceased  . PGM  Deceased  . PGF  Deceased  . Brother  (Not Specified)  . Neg Hx  (Not Specified)    ROS:  Please see the history of present illness.  All other ROS reviewed and negative.     Physical Exam/Data:   Vitals:   04/14/20 1443 04/14/20 1514  BP: (!) 122/55   Pulse: 97   Resp: 18   Temp: 99.4 F (37.4 C) 100.1 F (37.8 C)  TempSrc: Oral Oral  SpO2: 96%    No intake or output data in the 24 hours ending 04/14/20 2002  Last 3 Weights 04/11/2020 01/18/2020 11/18/2019  Weight (lbs) 250 lb 250 lb 9 oz 256 lb  Weight (kg) 113.399 kg 113.654 kg 116.121 kg     There is no height or weight on file to calculate BMI.   General:  Well nourished, well developed, male in mild distress HEENT: normal Lymph: no adenopathy  Neck: JVD - not elevated Endocrine:  No thryomegaly Vascular: No carotid bruits; 4/4 extremity pulses 2+  Cardiac:  normal S1, S2; RRR; no murmur Lungs:  clear bilaterally, no wheezing, rhonchi or rales  Abd: soft, tender, no hepatomegaly, large ventral hernia Ext: no edema Musculoskeletal:  No  deformities, BUE and BLE strength normal and equal Skin: warm and dry  Neuro:  CNs 2-12 intact, no focal abnormalities noted Psych:  Normal affect   EKG:  The EKG was personally reviewed and demonstrates:  10/14 ECG is read as atrial fib, but believe is ST w/ frequent PACs, possible WAP, HR 107, no acute ischemic changes Telemetry:  Telemetry was personally reviewed and demonstrates: ST, frequent PACs, possible WAP  CV studies:   ECHO: 04/12/2020 1. Left ventricular ejection fraction, by estimation, is 60 to 65%. The  left ventricle has normal function. The left ventricle demonstrates  regional wall motion abnormalities (see scoring diagram/findings for  description). Left ventricular diastolic  parameters are consistent with Grade I diastolic dysfunction (impaired  relaxation). Elevated left atrial pressure. There is mild hypokinesis of  the left ventricular, basal inferior wall.  2. Right ventricular systolic function is normal. The right ventricular  size is normal.  3. Left atrial size was mildly dilated.  4. Right atrial size was mildly dilated.  5. The mitral valve is normal in structure. No evidence of mitral valve  regurgitation. No evidence of mitral stenosis.  6. The aortic valve is normal in structure. Aortic valve regurgitation is  not visualized. Mild aortic valve sclerosis is present, with no evidence  of aortic valve stenosis.  7. The inferior vena cava is normal in size with greater than 50%  respiratory variability, suggesting right atrial pressure of 3 mmHg.   CATH: 04/12/2020  Prox LAD lesion is 85% stenosed.  Ost 1st Diag to 1st Diag lesion is 85% stenosed.  Dist LAD lesion is 65% stenosed.  Prox LAD to Mid LAD lesion is 75% stenosed.  Ost Cx to Mid Cx lesion is 80% stenosed.  Ost 1st Mrg to 1st Mrg lesion is 95% stenosed.  Mid Cx to Dist Cx lesion is 65% stenosed.  2nd Mrg lesion is 75% stenosed.  RPDA lesion is 60% stenosed.  1st RPL  lesion is 70% stenosed.  Prox RCA to Mid RCA lesion is 30% stenosed.  Previously placed Dist RCA drug eluting stent is widely patent.  Balloon angioplasty was performed.  LV end diastolic pressure is normal.   1. Severe 2 vessel obstructive CAD 2. Patent stent in the RCA 3. Normal LVEDP  Plan: the severe disease in the left coronary distribution is unchanged from prior. No suitable targets for PCI. Recommend continued medical therapy. If refractory angina would need to consider CABG.  Diagnostic Dominance: Right     Laboratory Data:   Chemistry Recent Labs  Lab 04/11/20 1437 04/11/20 1437 04/11/20 1804 04/12/20 0233 04/14/20 1442  NA 140  --   --  137 135  K 4.4  --   --  4.4 3.9  CL 107  --   --  105 104  CO2 22  --   --  21* 21*  GLUCOSE 210*  --   --  212* 351*  BUN 28*  --   --  28* 27*  CREATININE 1.59*   < > 1.51* 1.45* 1.91*  CALCIUM 8.7*  --   --  8.8* 8.6*  GFRNONAA 39*   < > 42* 44* 31*  ANIONGAP 11  --   --  11 10   < > = values in this interval not displayed.    Lab Results  Component Value Date   ALT 322 (H) 04/14/2020   AST 131 (H) 04/14/2020   ALKPHOS 214 (H) 04/14/2020   BILITOT 2.1 (H) 04/14/2020   Hematology Recent Labs  Lab 04/11/20 1437 04/12/20 0233 04/14/20 1442  WBC 9.9 7.2 5.5  RBC 4.04* 3.92* 4.30  HGB 12.2* 11.8* 12.8*  HCT 38.8* 36.3* 40.3  MCV 96.0 92.6 93.7  MCH 30.2 30.1 29.8  MCHC 31.4 32.5 31.8  RDW 15.2 15.1 15.2  PLT 165 153 144*   Cardiac Enzymes High Sensitivity Troponin:   Recent Labs  Lab 04/11/20 1437 04/11/20 1709 04/14/20 1442 04/14/20 1711  TROPONINIHS 8 7 14 13       BNPNo results for input(s): BNP, PROBNP in the last 168 hours.  DDimer No results for input(s): DDIMER in the last 168 hours. TSH:  Lab Results  Component Value Date   TSH 5.58 (H) 01/18/2020   Lipids: Lab Results  Component Value Date   CHOL 94 01/18/2020   HDL 31.40 (L) 01/18/2020   LDLCALC 41 01/18/2020   LDLDIRECT  78.5 11/17/2012   TRIG 108.0 01/18/2020   CHOLHDL 3 01/18/2020   HgbA1c: Lab Results  Component Value Date   HGBA1C 7.0 (H) 04/11/2020   Magnesium:  Magnesium  Date Value Ref Range Status  06/19/2017 2.0 1.7 - 2.4 mg/dL Final     Radiology/Studies:  DG Chest 2 View  Result Date: 04/14/2020 CLINICAL DATA:  Chest pain and fever for 1 day EXAM: CHEST - 2 VIEW COMPARISON:  04/11/2020 FINDINGS: Cardiac shadow is mildly enlarged but stable. The lungs are well aerated bilaterally. Mild atelectatic changes are again noted in the left base laterally. No sizable effusion is noted. No bony abnormality is seen. IMPRESSION: Stable left basilar atelectasis. Electronically Signed   By: Inez Catalina M.D.   On: 04/14/2020 15:27   CT Angio Chest PE W and/or Wo Contrast  Result Date: 04/14/2020 CLINICAL DATA:  Shortness of breath with fevers and abdominal pain EXAM: CT ANGIOGRAPHY CHEST CT ABDOMEN AND PELVIS WITH CONTRAST TECHNIQUE: Multidetector CT imaging of the chest was performed using the standard protocol during bolus administration of intravenous contrast. Multiplanar CT image reconstructions and MIPs were obtained to evaluate the vascular anatomy. Multidetector CT imaging of the abdomen and pelvis was performed using the standard protocol during bolus administration of intravenous contrast. CONTRAST:  57m OMNIPAQUE IOHEXOL 350 MG/ML SOLN COMPARISON:  None. FINDINGS: CTA CHEST FINDINGS Cardiovascular: Thoracic aorta demonstrates mild atherosclerotic calcifications. No aneurysmal dilatation is seen. No significant opacification is noted. Coronary calcifications are seen. No cardiac enlargement is noted. The pulmonary artery is well visualized with a normal branching pattern bilaterally. No filling defect to suggest pulmonary embolism is noted. Mediastinum/Nodes: Thoracic inlet is within normal limits. No sizable hilar or mediastinal adenopathy is noted. A few small calcified nodes are seen consistent  with prior granulomatous disease. The esophagus is within normal limits. Lungs/Pleura: Mild dependent atelectatic changes are noted bilaterally. Some mosaic attenuation in the lungs is noted consistent with air trapping. No focal confluent infiltrate or sizable effusion is seen. No parenchymal nodules are noted. Calcified granuloma is noted in the right lower lobe. Musculoskeletal: Degenerative changes of the thoracic spine are noted. No acute rib abnormality is noted. Review of the MIP images confirms the above findings. CT ABDOMEN and PELVIS FINDINGS Hepatobiliary: Gallbladder is partially distended with a few small dependent gallstones.  No pericholecystic fluid is noted. The liver shows fatty infiltration. Pancreas: Unremarkable. No pancreatic ductal dilatation or surrounding inflammatory changes. Spleen: Normal in size without focal abnormality. Adrenals/Urinary Tract: Adrenal glands are unremarkable bilaterally. Kidneys are well visualized bilaterally with large bilateral renal cysts. The largest of these on the right measures 8.5 cm. The largest of these on the left measures 13 cm in greatest dimension. Normal enhancement and excretion is seen. Bladder is partially distended. Ureters are within normal limits. No renal calculi are seen. Stomach/Bowel: Scattered diverticular change of the colon is noted. Very mild pericolonic inflammatory changes noted in the proximal sigmoid colon consistent with early diverticulitis. No perforation or abscess formation is noted. The appendix is within normal limits. The remainder of the colon is unremarkable. Stomach is decompressed. Small bowel is unremarkable. Vascular/Lymphatic: Aortic atherosclerosis. No enlarged abdominal or pelvic lymph nodes. Reproductive: Prostate is unremarkable. Other: No abdominal wall hernia or abnormality. No abdominopelvic ascites. Musculoskeletal: Degenerative changes of lumbar spine are noted. No compression deformity is noted. Review of the  MIP images confirms the above findings. IMPRESSION: CTA of the chest: No evidence of pulmonary emboli. Mild dependent atelectatic changes. Prior granulomatous disease. Aortic Atherosclerosis (ICD10-I70.0). CT of the abdomen and pelvis: Mild diverticulitis in the sigmoid without evidence of perforation or abscess formation. Cholelithiasis without complicating factors. Bilateral renal cysts as described. No obstructive changes are noted. Electronically Signed   By: Inez Catalina M.D.   On: 04/14/2020 18:09   CT ABDOMEN PELVIS W CONTRAST  Result Date: 04/14/2020 CLINICAL DATA:  Shortness of breath with fevers and abdominal pain EXAM: CT ANGIOGRAPHY CHEST CT ABDOMEN AND PELVIS WITH CONTRAST TECHNIQUE: Multidetector CT imaging of the chest was performed using the standard protocol during bolus administration of intravenous contrast. Multiplanar CT image reconstructions and MIPs were obtained to evaluate the vascular anatomy. Multidetector CT imaging of the abdomen and pelvis was performed using the standard protocol during bolus administration of intravenous contrast. CONTRAST:  23m OMNIPAQUE IOHEXOL 350 MG/ML SOLN COMPARISON:  None. FINDINGS: CTA CHEST FINDINGS Cardiovascular: Thoracic aorta demonstrates mild atherosclerotic calcifications. No aneurysmal dilatation is seen. No significant opacification is noted. Coronary calcifications are seen. No cardiac enlargement is noted. The pulmonary artery is well visualized with a normal branching pattern bilaterally. No filling defect to suggest pulmonary embolism is noted. Mediastinum/Nodes: Thoracic inlet is within normal limits. No sizable hilar or mediastinal adenopathy is noted. A few small calcified nodes are seen consistent with prior granulomatous disease. The esophagus is within normal limits. Lungs/Pleura: Mild dependent atelectatic changes are noted bilaterally. Some mosaic attenuation in the lungs is noted consistent with air trapping. No focal confluent  infiltrate or sizable effusion is seen. No parenchymal nodules are noted. Calcified granuloma is noted in the right lower lobe. Musculoskeletal: Degenerative changes of the thoracic spine are noted. No acute rib abnormality is noted. Review of the MIP images confirms the above findings. CT ABDOMEN and PELVIS FINDINGS Hepatobiliary: Gallbladder is partially distended with a few small dependent gallstones. No pericholecystic fluid is noted. The liver shows fatty infiltration. Pancreas: Unremarkable. No pancreatic ductal dilatation or surrounding inflammatory changes. Spleen: Normal in size without focal abnormality. Adrenals/Urinary Tract: Adrenal glands are unremarkable bilaterally. Kidneys are well visualized bilaterally with large bilateral renal cysts. The largest of these on the right measures 8.5 cm. The largest of these on the left measures 13 cm in greatest dimension. Normal enhancement and excretion is seen. Bladder is partially distended. Ureters are within normal limits. No renal calculi are  seen. Stomach/Bowel: Scattered diverticular change of the colon is noted. Very mild pericolonic inflammatory changes noted in the proximal sigmoid colon consistent with early diverticulitis. No perforation or abscess formation is noted. The appendix is within normal limits. The remainder of the colon is unremarkable. Stomach is decompressed. Small bowel is unremarkable. Vascular/Lymphatic: Aortic atherosclerosis. No enlarged abdominal or pelvic lymph nodes. Reproductive: Prostate is unremarkable. Other: No abdominal wall hernia or abnormality. No abdominopelvic ascites. Musculoskeletal: Degenerative changes of lumbar spine are noted. No compression deformity is noted. Review of the MIP images confirms the above findings. IMPRESSION: CTA of the chest: No evidence of pulmonary emboli. Mild dependent atelectatic changes. Prior granulomatous disease. Aortic Atherosclerosis (ICD10-I70.0). CT of the abdomen and pelvis: Mild  diverticulitis in the sigmoid without evidence of perforation or abscess formation. Cholelithiasis without complicating factors. Bilateral renal cysts as described. No obstructive changes are noted. Electronically Signed   By: Inez Catalina M.D.   On: 04/14/2020 18:09   CARDIAC CATHETERIZATION  Result Date: 04/12/2020  Prox LAD lesion is 85% stenosed.  Ost 1st Diag to 1st Diag lesion is 85% stenosed.  Dist LAD lesion is 65% stenosed.  Prox LAD to Mid LAD lesion is 75% stenosed.  Ost Cx to Mid Cx lesion is 80% stenosed.  Ost 1st Mrg to 1st Mrg lesion is 95% stenosed.  Mid Cx to Dist Cx lesion is 65% stenosed.  2nd Mrg lesion is 75% stenosed.  RPDA lesion is 60% stenosed.  1st RPL lesion is 70% stenosed.  Prox RCA to Mid RCA lesion is 30% stenosed.  Previously placed Dist RCA drug eluting stent is widely patent.  Balloon angioplasty was performed.  LV end diastolic pressure is normal.  1. Severe 2 vessel obstructive CAD 2. Patent stent in the RCA 3. Normal LVEDP Plan: the severe disease in the left coronary distribution is unchanged from prior. No suitable targets for PCI. Recommend continued medical therapy. If refractory angina would need to consider CABG.   DG Chest Portable 1 View  Result Date: 04/11/2020 CLINICAL DATA:  Chest pain short of breath EXAM: PORTABLE CHEST 1 VIEW COMPARISON:  07/03/2018 FINDINGS: Heart size upper normal.  Normal vascularity. Mild airspace disease in the left costophrenic angle is new. Possible pneumonia or pulmonary infarct. No significant effusion. Right lung clear. IMPRESSION: Small area of airspace disease in the left lateral lung base. Possible pneumonia or pulmonary infarct. Electronically Signed   By: Franchot Gallo M.D.   On: 04/11/2020 14:59   ECHOCARDIOGRAM COMPLETE  Result Date: 04/12/2020    ECHOCARDIOGRAM REPORT   Patient Name:   Ryan Garcia Date of Exam: 04/12/2020 Medical Rec #:  [id number]     Height:       71.0 in Accession #:    [id number]     Weight:       250.0 lb Date of Birth:  December 11, 1933     BSA:          2.318 m Patient Age:    76 years      BP:           118/58 mmHg Patient Gender: M             HR:           73 bpm. Exam Location:  Inpatient Procedure: 2D Echo, Cardiac Doppler, Color Doppler and Intracardiac            Opacification Agent Indications:    Chest pain  History:  Patient has prior history of Echocardiogram examinations, most                 recent 06/19/2017. CAD and Previous Myocardial Infarction; Risk                 Factors:Hypertension, Sleep Apnea, Dyslipidemia and Diabetes.  Sonographer:    Clayton Lefort RDCS (AE) Referring Phys: [id number] [facility]  Sonographer Comments: Suboptimal apical window, suboptimal parasternal window and patient is morbidly obese. IMPRESSIONS  1. Left ventricular ejection fraction, by estimation, is 60 to 65%. The left ventricle has normal function. The left ventricle demonstrates regional wall motion abnormalities (see scoring diagram/findings for description). Left ventricular diastolic parameters are consistent with Grade I diastolic dysfunction (impaired relaxation). Elevated left atrial pressure. There is mild hypokinesis of the left ventricular, basal inferior wall.  2. Right ventricular systolic function is normal. The right ventricular size is normal.  3. Left atrial size was mildly dilated.  4. Right atrial size was mildly dilated.  5. The mitral valve is normal in structure. No evidence of mitral valve regurgitation. No evidence of mitral stenosis.  6. The aortic valve is normal in structure. Aortic valve regurgitation is not visualized. Mild aortic valve sclerosis is present, with no evidence of aortic valve stenosis.  7. The inferior vena cava is normal in size with greater than 50% respiratory variability, suggesting right atrial pressure of 3 mmHg. FINDINGS  Left Ventricle: Left ventricular ejection fraction, by estimation, is 60 to 65%. The left ventricle has normal function.  The left ventricle demonstrates regional wall motion abnormalities. Mild hypokinesis of the left ventricular, basal inferior wall. Definity contrast agent was given IV to delineate the left ventricular endocardial borders. The left ventricular internal cavity size was normal in size. There is no left ventricular hypertrophy. Left ventricular diastolic parameters are consistent with Grade I diastolic dysfunction (impaired relaxation). Elevated left atrial pressure. Right Ventricle: The right ventricular size is normal. No increase in right ventricular wall thickness. Right ventricular systolic function is normal. Left Atrium: Left atrial size was mildly dilated. Right Atrium: Right atrial size was mildly dilated. Pericardium: There is no evidence of pericardial effusion. Mitral Valve: The mitral valve is normal in structure. No evidence of mitral valve regurgitation. No evidence of mitral valve stenosis. Tricuspid Valve: The tricuspid valve is normal in structure. Tricuspid valve regurgitation is not demonstrated. No evidence of tricuspid stenosis. Aortic Valve: The aortic valve is normal in structure. Aortic valve regurgitation is not visualized. Mild aortic valve sclerosis is present, with no evidence of aortic valve stenosis. Aortic valve mean gradient measures 6.0 mmHg. Aortic valve peak gradient measures 10.4 mmHg. Aortic valve area, by VTI measures 2.46 cm. Pulmonic Valve: The pulmonic valve was normal in structure. Pulmonic valve regurgitation is not visualized. No evidence of pulmonic stenosis. Aorta: The aortic root is normal in size and structure. Venous: The inferior vena cava is normal in size with greater than 50% respiratory variability, suggesting right atrial pressure of 3 mmHg. IAS/Shunts: No atrial level shunt detected by color flow Doppler.  LEFT VENTRICLE PLAX 2D LVIDd:         4.00 cm  Diastology LVIDs:         2.50 cm  LV e' medial:    3.81 cm/s LV PW:         1.60 cm  LV E/e' medial:  16.4 LV  IVS:        1.90 cm  LV e' lateral:  4.13 cm/s LVOT diam:     1.90 cm  LV E/e' lateral: 15.1 LV SV:         74 LV SV Index:   32 LVOT Area:     2.84 cm  RIGHT VENTRICLE             IVC RV Basal diam:  3.90 cm     IVC diam: 0.80 cm RV Mid diam:    2.90 cm RV S prime:     17.40 cm/s TAPSE (M-mode): 2.6 cm LEFT ATRIUM             Index       RIGHT ATRIUM           Index LA diam:        3.50 cm 1.51 cm/m  RA Area:     22.70 cm LA Vol (A2C):   72.9 ml 31.45 ml/m RA Volume:   71.80 ml  30.97 ml/m LA Vol (A4C):   60.5 ml 26.10 ml/m LA Biplane Vol: 70.5 ml 30.41 ml/m  AORTIC VALVE AV Area (Vmax):    2.31 cm AV Area (Vmean):   2.19 cm AV Area (VTI):     2.46 cm AV Vmax:           161.00 cm/s AV Vmean:          114.667 cm/s AV VTI:            0.299 m AV Peak Grad:      10.4 mmHg AV Mean Grad:      6.0 mmHg LVOT Vmax:         131.33 cm/s LVOT Vmean:        88.600 cm/s LVOT VTI:          0.259 m LVOT/AV VTI ratio: 0.87  AORTA Ao Root diam: 3.70 cm Ao Asc diam:  3.60 cm MV E velocity: 62.43 cm/s                            SHUNTS                            Systemic VTI:  0.26 m                            Systemic Diam: 1.90 cm Dani Gobble Croitoru MD Electronically signed by Sanda Klein MD Signature Date/Time: 04/12/2020/10:36:31 AM    Final     Assessment and Plan:   1. Preop cardiovascular evaluation - recent cath results above, 3 v dz is stable, no PCI performed - if refractory angina, consider CABG - because of his CAD, he is a high risk for any surgical procedure - if abd issues can be treated otherwise, at least for now, that would be beneficial - a non-surgical procedure such as ERCP would engender less risk  2. CAD - ez did not elevate during previous admission, and are not elevated today - continue med rx for CAD w/ ASA, Imdur - will increase metoprolol 12.5 mg bid to 25 mg bid - w/ abnl LFTs, hold Crestor - w/ Cr above baseline, hold HCTZ to get increased BB on board.  3. Possible  diverticulitis vs cholangitis - [facility] has seen, they recommend GI eval if [place] indicates pt may need ERCP - CT results above, [place] pending. - ABX per IM/[facility]  4. Tachycardia -  no hx Afib - ECG is read as Afib, but p waves are seen - SR w/ frequent PACs vs WAP, MD advise - increase BB, keep on telemetry   Active Problems:   * No active hospital problems. *    For questions or updates, please contact [place] Please consult [email or web address] for contact info under Cardiology/STEMI.   SignedRosaria Ferries, PA-C  04/14/2020 8:02 PM  Patient seen and examined.  Agree with above documentation.  Ryan Garcia is an 86 year old male with severe multivessel coronary artery disease being medically managed, OSA, hypertension, T2DM who we are consulted to see by Dr Billy Fischer for pre-op evaluation. He was admitted to [facility] from 10/11 through 04/12/2020 after presenting with chest pain.  Troponins were negative.  He underwent cardiac catheterization on 10/12, showed severe multivessel disease, unchanged from prior cath with no targets for PCI.  Medical management was recommended.  He was discharged on aspirin, statin, beta-blocker, ACE inhibitor, Imdur.  After discharge he reported onset of fevers up to 103F and abdominal pain, prompting evaluation today in urgent care.  He was sent to the ED.  In the ED, vitals notable for BP 122/55, pulse 97, temp 99.4, SPO2 96% on room air.  Labs notable for creatinine 1.9 (increased from 1.5 on 10/12), potassium 3.9, sodium 135, hemoglobin 12.8, WBC 5.5, platelets 144, high-sensitivity troponin I 14 > 13, albumin 3.0, alk phos 214, lipase 28, ALT 322, AST 131, total bilirubin 2.1, lactate 1.3.  CTPA was done which showed no evidence of PE.  CT abdomen pelvis showed mild diverticulitis, cholelithiasis without complicating features, bilateral renal cysts.  Right upper quadrant ultrasound shows cholelithiasis without evidence of cholecystitis, renal mass suspicious for neoplasm.   General surgery was consulted to evaluate for possible choledocholithiasis/cholangitis and diverticulitis.  Given lack of left-sided abdominal pain, felt diverticulitis less likely.  Started on IV Zosyn.  GI consulted.  EKG shows sinus rhythm with frequent PACs.  Telemetry personally reviewed and shows sinus rhythm with frequent PACs (versus possible wandering atrial pacemaker, as does appear to be change in P wave morphology at times). On exam, patient is alert and oriented, normal rate, irregular rhythm, no murmurs, lungs CTAB, no LE edema, RUQ TTP.  In regards to preop evaluation, patient is high risk for surgery given  unrevascularized multivessel CAD.  MRCP ordered to evaluate for choledocholithiasis.  Agree with consulting GI for ERCP evaluation pending results of MRCP.

## 2020-04-14 NOTE — ED Notes (Signed)
Patient is being discharged from the Urgent Care and sent to the Emergency Department via POV . Per Hall Busing NP, patient is in need of higher level of care due to fever, right sided rib pain and recent heart cath. Patient is aware and verbalizes understanding of plan of care.  Vitals:   04/14/20 1141  BP: 126/67  Pulse: 93  Resp: 17  Temp: (!) 100.8 F (38.2 C)  SpO2: 94%

## 2020-04-14 NOTE — Telephone Encounter (Signed)
Pt's wife called and states she wanted to update [place] on status of patient. He has been sent to [facility] hospital.

## 2020-04-14 NOTE — Telephone Encounter (Signed)
Mrs Breeze said that pts unbalanced feeling is better today and he still has slight unbalanced feeling but pt is up walking on his own today. Pt still has fever of 102.3. No available appts at [facility] today and Mrs Bogus is going to take pt to [facility] in [place]. FYI to Dr Glori Bickers.

## 2020-04-14 NOTE — Discharge Instructions (Addendum)
Go to the Emergency Department for evaluation of your fever and right side pain.

## 2020-04-14 NOTE — ED Triage Notes (Signed)
Pt sent from UC for further evaluation of fever and right sided chest pain that started on Monday, pt has heart cath done on 10/11. Pt a/o, temp 99.4 in triage

## 2020-04-14 NOTE — Consult Note (Signed)
CC: Possible choledocholithiasis, cholangitis?  Requesting provider: Asencion Noble, MD  HPI: Ryan Garcia is an 86 y.o. male with extensive cardiac hx, HTN, DM, HLD, CKD - severe 3v dz previously treated with PCI as he wanted to avoid cabg - recently discharged 2d ago following left heart cath 10/12 - reportedly may have had unstable angina? But found to have severe 2v disease - was seen in urgent care earlier today for some right sided chest/rib pain and referred to [facility] ED.  Underwent workup - found to have transaminitis, tbili 2.1 and had CT A/P which shows gallstones and possibly mild diverticulitis.   RUQ [place] is pending  He reports subjective fevers, shortness of breath and pain with deep inspiration in right abdomen that began day before discharge. Wife reports his skin began appearing more yellow on his face during his last admission. Pain does not radiate. Nothing makes it better/worse. Associates loose stool x 3 days with bilateral lower quadrant cramps that have since resolved. Denies any left lower abdominal pains. Denies known hx of diverticulitis  Past Medical History:  Diagnosis Date  . AC (acromioclavicular) joint bone spurs    lt ankle  . Allergy    allergic rhinitis  . Arthritis    OA  . BPH (benign prostatic hyperplasia)    microwave tx of prostate  . CAD (coronary artery disease)    a. s/p cath in 2012 showing 70-75% LAD stenosis, 75% D1 stenosis, 75% OM1, 60-70% RCA stenosis, and 80% PDA --> medical therapy pursued b. similar findings by cath in 2016; 06/2017 DES to distal RCA  . Chronic upper back pain   . Diabetes mellitus    type II x8 years  . GERD (gastroesophageal reflux disease)   . HLD (hyperlipidemia)    10/12: TC 208, TG 213, HDL 36, LDL 129  . Hx of skin cancer, basal cell    many skin cancers removed/under constant treatment.  . Hypertension   . NSTEMI (non-ST elevated myocardial infarction) ([place]) 06/18/2017   DES to distal RCA  . Obstructive sleep  apnea on CPAP   . Scoliosis     Past Surgical History:  Procedure Laterality Date  . BREAST SURGERY  2009   breast lump removed benign  . CARDIAC CATHETERIZATION N/A 03/15/2015   Procedure: Left Heart Cath and Coronary Angiography;  Surgeon: Belva Crome, MD;  Location: [facility];  Service: Cardiovascular;  Laterality: N/A;  . CORONARY STENT INTERVENTION N/A 06/18/2017   Procedure: CORONARY STENT INTERVENTION;  Surgeon: Jettie Booze, MD;  Location: [facility];  Service: Cardiovascular;  Laterality: N/A;  RCA  . LEFT HEART CATH AND CORONARY ANGIOGRAPHY N/A 06/18/2017   Procedure: LEFT HEART CATH AND CORONARY ANGIOGRAPHY;  Surgeon: Jettie Booze, MD;  Location: [facility];  Service: Cardiovascular;  Laterality: N/A;  . LEFT HEART CATH AND CORONARY ANGIOGRAPHY N/A 04/12/2020   Procedure: LEFT HEART CATH AND CORONARY ANGIOGRAPHY;  Surgeon: Martinique, Peter M, MD;  Location: [facility];  Service: Cardiovascular;  Laterality: N/A;  . ULTRASOUND GUIDANCE FOR VASCULAR ACCESS  06/18/2017   Procedure: Ultrasound Guidance For Vascular Access;  Surgeon: Jettie Booze, MD;  Location: [facility];  Service: Cardiovascular;;  right radial, right femoral    Family History  Problem Relation Age of Onset  . Heart attack Brother   . Colon cancer Neg Hx     Social:  reports that he has never smoked. He has  never used smokeless tobacco. He reports that he does not drink alcohol and does not use drugs.  Allergies:  Allergies  Allergen Reactions  . Loratadine     REACTION: not effective  . Simvastatin     REACTION: intolerant    Medications: I have reviewed the patient's current medications.  Results for orders placed or performed during the hospital encounter of 04/14/20 (from the past 48 hour(s))  Basic metabolic panel     Status: Abnormal   Collection Time: 04/14/20  2:42 PM  Result Value Ref Range   Sodium 135 135 - 145 mmol/L    Potassium 3.9 3.5 - 5.1 mmol/L   Chloride 104 98 - 111 mmol/L   CO2 21 (L) 22 - 32 mmol/L   Glucose, Bld 351 (H) 70 - 99 mg/dL    Comment: Glucose reference range applies only to samples taken after fasting for at least 8 hours.   BUN 27 (H) 8 - 23 mg/dL   Creatinine, Ser 1.91 (H) 0.61 - 1.24 mg/dL   Calcium 8.6 (L) 8.9 - 10.3 mg/dL   GFR, Estimated 31 (L) >60 mL/min   Anion gap 10 5 - 15    Comment: Performed at [facility] [address] [place], [place] [address]  CBC     Status: Abnormal   Collection Time: 04/14/20  2:42 PM  Result Value Ref Range   WBC 5.5 4.0 - 10.5 K/uL   RBC 4.30 4.22 - 5.81 MIL/uL   Hemoglobin 12.8 (L) 13.0 - 17.0 g/dL   HCT 40.3 39 - 52 %   MCV 93.7 80.0 - 100.0 fL   MCH 29.8 26.0 - 34.0 pg   MCHC 31.8 30.0 - 36.0 g/dL   RDW 15.2 11.5 - 15.5 %   Platelets 144 (L) 150 - 400 K/uL   nRBC 0.0 0.0 - 0.2 %    Comment: Performed at [facility], [place] [address], [place], [place] [address]  Troponin I (High Sensitivity)     Status: None   Collection Time: 04/14/20  2:42 PM  Result Value Ref Range   Troponin I (High Sensitivity) 14 <18 ng/L    Comment: (NOTE) Elevated high sensitivity troponin I (hsTnI) values and significant  changes across serial measurements may suggest ACS but many other  chronic and acute conditions are known to elevate hsTnI results.  Refer to the "Links" section for chest pain algorithms and additional  guidance. Performed at [facility], [place] [address] [place], [place] [address]   Hepatic function panel     Status: Abnormal   Collection Time: 04/14/20  5:11 PM  Result Value Ref Range   Total Protein 6.2 (L) 6.5 - 8.1 g/dL   Albumin 3.0 (L) 3.5 - 5.0 g/dL   AST 131 (H) 15 - 41 U/L   ALT 322 (H) 0 - 44 U/L   Alkaline Phosphatase 214 (H) 38 - 126 U/L   Total Bilirubin 2.1 (H) 0.3 - 1.2 mg/dL   Bilirubin, Direct 1.0 (H) 0.0 - 0.2 mg/dL   Indirect Bilirubin 1.1 (H) 0.3 - 0.9 mg/dL    Comment: Performed at  [facility] [address], [place], [place] [address]  Lactic acid, plasma     Status: None   Collection Time: 04/14/20  5:11 PM  Result Value Ref Range   Lactic Acid, Venous 1.3 0.5 - 1.9 mmol/L    Comment: Performed at [facility]  [address] [place], [place] [address]  Troponin I (High Sensitivity)     Status: None   Collection Time: 04/14/20  5:11 PM  Result Value Ref Range   Troponin I (High Sensitivity) 13 <18 ng/L    Comment: (NOTE) Elevated high sensitivity troponin I (hsTnI) values and significant  changes across serial measurements may suggest ACS but many other  chronic and acute conditions are known to elevate hsTnI results.  Refer to the "Links" section for chest pain algorithms and additional  guidance. Performed at [facility], [place] [address], [place], [place] [address]   Lipase, blood     Status: None   Collection Time: 04/14/20  5:11 PM  Result Value Ref Range   Lipase 28 11 - 51 U/L    Comment: Performed at [facility], [place] [address] [place], [place] [address]    DG Chest 2 View  Result Date: 04/14/2020 CLINICAL DATA:  Chest pain and fever for 1 day EXAM: CHEST - 2 VIEW COMPARISON:  04/11/2020 FINDINGS: Cardiac shadow is mildly enlarged but stable. The lungs are well aerated bilaterally. Mild atelectatic changes are again noted in the left base laterally. No sizable effusion is noted. No bony abnormality is seen. IMPRESSION: Stable left basilar atelectasis. Electronically Signed   By: Inez Catalina M.D.   On: 04/14/2020 15:27   CT Angio Chest PE W and/or Wo Contrast  Result Date: 04/14/2020 CLINICAL DATA:  Shortness of breath with fevers and abdominal pain EXAM: CT ANGIOGRAPHY CHEST CT ABDOMEN AND PELVIS WITH CONTRAST TECHNIQUE: Multidetector CT imaging of the chest was performed using the standard protocol during bolus administration of intravenous contrast. Multiplanar CT image reconstructions and MIPs were obtained to evaluate  the vascular anatomy. Multidetector CT imaging of the abdomen and pelvis was performed using the standard protocol during bolus administration of intravenous contrast. CONTRAST:  28mL OMNIPAQUE IOHEXOL 350 MG/ML SOLN COMPARISON:  None. FINDINGS: CTA CHEST FINDINGS Cardiovascular: Thoracic aorta demonstrates mild atherosclerotic calcifications. No aneurysmal dilatation is seen. No significant opacification is noted. Coronary calcifications are seen. No cardiac enlargement is noted. The pulmonary artery is well visualized with a normal branching pattern bilaterally. No filling defect to suggest pulmonary embolism is noted. Mediastinum/Nodes: Thoracic inlet is within normal limits. No sizable hilar or mediastinal adenopathy is noted. A few small calcified nodes are seen consistent with prior granulomatous disease. The esophagus is within normal limits. Lungs/Pleura: Mild dependent atelectatic changes are noted bilaterally. Some mosaic attenuation in the lungs is noted consistent with air trapping. No focal confluent infiltrate or sizable effusion is seen. No parenchymal nodules are noted. Calcified granuloma is noted in the right lower lobe. Musculoskeletal: Degenerative changes of the thoracic spine are noted. No acute rib abnormality is noted. Review of the MIP images confirms the above findings. CT ABDOMEN and PELVIS FINDINGS Hepatobiliary: Gallbladder is partially distended with a few small dependent gallstones. No pericholecystic fluid is noted. The liver shows fatty infiltration. Pancreas: Unremarkable. No pancreatic ductal dilatation or surrounding inflammatory changes. Spleen: Normal in size without focal abnormality. Adrenals/Urinary Tract: Adrenal glands are unremarkable bilaterally. Kidneys are well visualized bilaterally with large bilateral renal cysts. The largest of these on the right measures 8.5 cm. The largest of these on the left measures 13 cm in greatest dimension. Normal enhancement and excretion  is seen. Bladder is partially distended. Ureters are within normal limits. No renal calculi are seen. Stomach/Bowel: Scattered diverticular change of the colon is noted. Very mild pericolonic inflammatory changes noted  in the proximal sigmoid colon consistent with early diverticulitis. No perforation or abscess formation is noted. The appendix is within normal limits. The remainder of the colon is unremarkable. Stomach is decompressed. Small bowel is unremarkable. Vascular/Lymphatic: Aortic atherosclerosis. No enlarged abdominal or pelvic lymph nodes. Reproductive: Prostate is unremarkable. Other: No abdominal wall hernia or abnormality. No abdominopelvic ascites. Musculoskeletal: Degenerative changes of lumbar spine are noted. No compression deformity is noted. Review of the MIP images confirms the above findings. IMPRESSION: CTA of the chest: No evidence of pulmonary emboli. Mild dependent atelectatic changes. Prior granulomatous disease. Aortic Atherosclerosis (ICD10-I70.0). CT of the abdomen and pelvis: Mild diverticulitis in the sigmoid without evidence of perforation or abscess formation. Cholelithiasis without complicating factors. Bilateral renal cysts as described. No obstructive changes are noted. Electronically Signed   By: Inez Catalina M.D.   On: 04/14/2020 18:09   CT ABDOMEN PELVIS W CONTRAST  Result Date: 04/14/2020 CLINICAL DATA:  Shortness of breath with fevers and abdominal pain EXAM: CT ANGIOGRAPHY CHEST CT ABDOMEN AND PELVIS WITH CONTRAST TECHNIQUE: Multidetector CT imaging of the chest was performed using the standard protocol during bolus administration of intravenous contrast. Multiplanar CT image reconstructions and MIPs were obtained to evaluate the vascular anatomy. Multidetector CT imaging of the abdomen and pelvis was performed using the standard protocol during bolus administration of intravenous contrast. CONTRAST:  10mL OMNIPAQUE IOHEXOL 350 MG/ML SOLN COMPARISON:  None. FINDINGS:  CTA CHEST FINDINGS Cardiovascular: Thoracic aorta demonstrates mild atherosclerotic calcifications. No aneurysmal dilatation is seen. No significant opacification is noted. Coronary calcifications are seen. No cardiac enlargement is noted. The pulmonary artery is well visualized with a normal branching pattern bilaterally. No filling defect to suggest pulmonary embolism is noted. Mediastinum/Nodes: Thoracic inlet is within normal limits. No sizable hilar or mediastinal adenopathy is noted. A few small calcified nodes are seen consistent with prior granulomatous disease. The esophagus is within normal limits. Lungs/Pleura: Mild dependent atelectatic changes are noted bilaterally. Some mosaic attenuation in the lungs is noted consistent with air trapping. No focal confluent infiltrate or sizable effusion is seen. No parenchymal nodules are noted. Calcified granuloma is noted in the right lower lobe. Musculoskeletal: Degenerative changes of the thoracic spine are noted. No acute rib abnormality is noted. Review of the MIP images confirms the above findings. CT ABDOMEN and PELVIS FINDINGS Hepatobiliary: Gallbladder is partially distended with a few small dependent gallstones. No pericholecystic fluid is noted. The liver shows fatty infiltration. Pancreas: Unremarkable. No pancreatic ductal dilatation or surrounding inflammatory changes. Spleen: Normal in size without focal abnormality. Adrenals/Urinary Tract: Adrenal glands are unremarkable bilaterally. Kidneys are well visualized bilaterally with large bilateral renal cysts. The largest of these on the right measures 8.5 cm. The largest of these on the left measures 13 cm in greatest dimension. Normal enhancement and excretion is seen. Bladder is partially distended. Ureters are within normal limits. No renal calculi are seen. Stomach/Bowel: Scattered diverticular change of the colon is noted. Very mild pericolonic inflammatory changes noted in the proximal sigmoid  colon consistent with early diverticulitis. No perforation or abscess formation is noted. The appendix is within normal limits. The remainder of the colon is unremarkable. Stomach is decompressed. Small bowel is unremarkable. Vascular/Lymphatic: Aortic atherosclerosis. No enlarged abdominal or pelvic lymph nodes. Reproductive: Prostate is unremarkable. Other: No abdominal wall hernia or abnormality. No abdominopelvic ascites. Musculoskeletal: Degenerative changes of lumbar spine are noted. No compression deformity is noted. Review of the MIP images confirms the above findings. IMPRESSION: CTA of the  chest: No evidence of pulmonary emboli. Mild dependent atelectatic changes. Prior granulomatous disease. Aortic Atherosclerosis (ICD10-I70.0). CT of the abdomen and pelvis: Mild diverticulitis in the sigmoid without evidence of perforation or abscess formation. Cholelithiasis without complicating factors. Bilateral renal cysts as described. No obstructive changes are noted. Electronically Signed   By: Inez Catalina M.D.   On: 04/14/2020 18:09    ROS - all of the below systems have been reviewed with the patient and positives are indicated with bold text General: chills, fever or night sweats Eyes: blurry vision or double vision ENT: epistaxis or sore throat Allergy/Immunology: itchy/watery eyes or nasal congestion Hematologic/Lymphatic: bleeding problems, blood clots or swollen lymph nodes Endocrine: temperature intolerance or unexpected weight changes Breast: new or changing breast lumps or nipple discharge Resp: cough, shortness of breath, or wheezing CV: chest pain or dyspnea on exertion GI: as per HPI GU: dysuria, trouble voiding, or hematuria MSK: joint pain or joint stiffness Neuro: TIA or stroke symptoms Derm: pruritus and skin lesion changes Psych: anxiety and depression  PE Blood pressure (!) 122/55, pulse 97, temperature 100.1 F (37.8 C), temperature source Oral, resp. rate 18, SpO2 96  %. Constitutional: NAD; conversant; no deformities Eyes: Moist conjunctiva; no lid lag; mild scleral icterus; PERRL Neck: Trachea midline; no thyromegaly Lungs: Normal respiratory effort; no tactile fremitus CV: RRR; no palpable thrills; 1+ pitting edema GI: Abd obese, +diastasis; nondistended; RUQ tenderness, no rebound nor guarding; no LLQ tenderness at all; no palpable hepatosplenomegaly MSK: Normal range of motion of extremities; no clubbing/cyanosis Psychiatric: Appropriate affect; alert and oriented x3 Lymphatic: No palpable cervical or axillary lymphadenopathy  Results for orders placed or performed during the hospital encounter of 04/14/20 (from the past 48 hour(s))  Basic metabolic panel     Status: Abnormal   Collection Time: 04/14/20  2:42 PM  Result Value Ref Range   Sodium 135 135 - 145 mmol/L   Potassium 3.9 3.5 - 5.1 mmol/L   Chloride 104 98 - 111 mmol/L   CO2 21 (L) 22 - 32 mmol/L   Glucose, Bld 351 (H) 70 - 99 mg/dL    Comment: Glucose reference range applies only to samples taken after fasting for at least 8 hours.   BUN 27 (H) 8 - 23 mg/dL   Creatinine, Ser 1.91 (H) 0.61 - 1.24 mg/dL   Calcium 8.6 (L) 8.9 - 10.3 mg/dL   GFR, Estimated 31 (L) >60 mL/min   Anion gap 10 5 - 15    Comment: Performed at [facility] [address] [place], [place] [address]  CBC     Status: Abnormal   Collection Time: 04/14/20  2:42 PM  Result Value Ref Range   WBC 5.5 4.0 - 10.5 K/uL   RBC 4.30 4.22 - 5.81 MIL/uL   Hemoglobin 12.8 (L) 13.0 - 17.0 g/dL   HCT 40.3 39 - 52 %   MCV 93.7 80.0 - 100.0 fL   MCH 29.8 26.0 - 34.0 pg   MCHC 31.8 30.0 - 36.0 g/dL   RDW 15.2 11.5 - 15.5 %   Platelets 144 (L) 150 - 400 K/uL   nRBC 0.0 0.0 - 0.2 %    Comment: Performed at [facility], [place] [address] [place], [place] [address]  Troponin I (High Sensitivity)     Status: None   Collection Time: 04/14/20  2:42 PM  Result Value Ref Range   Troponin I (High Sensitivity) 14 <18  ng/L    Comment: (  NOTE) Elevated high sensitivity troponin I (hsTnI) values and significant  changes across serial measurements may suggest ACS but many other  chronic and acute conditions are known to elevate hsTnI results.  Refer to the "Links" section for chest pain algorithms and additional  guidance. Performed at [facility], [place] [address], [place], [place] [address]   Hepatic function panel     Status: Abnormal   Collection Time: 04/14/20  5:11 PM  Result Value Ref Range   Total Protein 6.2 (L) 6.5 - 8.1 g/dL   Albumin 3.0 (L) 3.5 - 5.0 g/dL   AST 131 (H) 15 - 41 U/L   ALT 322 (H) 0 - 44 U/L   Alkaline Phosphatase 214 (H) 38 - 126 U/L   Total Bilirubin 2.1 (H) 0.3 - 1.2 mg/dL   Bilirubin, Direct 1.0 (H) 0.0 - 0.2 mg/dL   Indirect Bilirubin 1.1 (H) 0.3 - 0.9 mg/dL    Comment: Performed at [facility] [address] [place], [place] [address]  Lactic acid, plasma     Status: None   Collection Time: 04/14/20  5:11 PM  Result Value Ref Range   Lactic Acid, Venous 1.3 0.5 - 1.9 mmol/L    Comment: Performed at [facility] [address], [place], [place] [address]  Troponin I (High Sensitivity)     Status: None   Collection Time: 04/14/20  5:11 PM  Result Value Ref Range   Troponin I (High Sensitivity) 13 <18 ng/L    Comment: (NOTE) Elevated high sensitivity troponin I (hsTnI) values and significant  changes across serial measurements may suggest ACS but many other  chronic and acute conditions are known to elevate hsTnI results.  Refer to the "Links" section for chest pain algorithms and additional  guidance. Performed at [facility], [place] [address], [place], [place] [address]   Lipase, blood     Status: None   Collection Time: 04/14/20  5:11 PM  Result Value Ref Range   Lipase 28 11 - 51 U/L    Comment: Performed at [facility], [place] [address], [place], [place] [address]    DG Chest 2 View  Result Date: 04/14/2020 CLINICAL  DATA:  Chest pain and fever for 1 day EXAM: CHEST - 2 VIEW COMPARISON:  04/11/2020 FINDINGS: Cardiac shadow is mildly enlarged but stable. The lungs are well aerated bilaterally. Mild atelectatic changes are again noted in the left base laterally. No sizable effusion is noted. No bony abnormality is seen. IMPRESSION: Stable left basilar atelectasis. Electronically Signed   By: Inez Catalina M.D.   On: 04/14/2020 15:27   CT Angio Chest PE W and/or Wo Contrast  Result Date: 04/14/2020 CLINICAL DATA:  Shortness of breath with fevers and abdominal pain EXAM: CT ANGIOGRAPHY CHEST CT ABDOMEN AND PELVIS WITH CONTRAST TECHNIQUE: Multidetector CT imaging of the chest was performed using the standard protocol during bolus administration of intravenous contrast. Multiplanar CT image reconstructions and MIPs were obtained to evaluate the vascular anatomy. Multidetector CT imaging of the abdomen and pelvis was performed using the standard protocol during bolus administration of intravenous contrast. CONTRAST:  48mL OMNIPAQUE IOHEXOL 350 MG/ML SOLN COMPARISON:  None. FINDINGS: CTA CHEST FINDINGS Cardiovascular: Thoracic aorta demonstrates mild atherosclerotic calcifications. No aneurysmal dilatation is seen. No significant opacification is noted. Coronary calcifications are seen. No cardiac enlargement is noted. The pulmonary artery is well visualized with a normal branching pattern bilaterally. No filling defect to suggest  pulmonary embolism is noted. Mediastinum/Nodes: Thoracic inlet is within normal limits. No sizable hilar or mediastinal adenopathy is noted. A few small calcified nodes are seen consistent with prior granulomatous disease. The esophagus is within normal limits. Lungs/Pleura: Mild dependent atelectatic changes are noted bilaterally. Some mosaic attenuation in the lungs is noted consistent with air trapping. No focal confluent infiltrate or sizable effusion is seen. No parenchymal nodules are noted.  Calcified granuloma is noted in the right lower lobe. Musculoskeletal: Degenerative changes of the thoracic spine are noted. No acute rib abnormality is noted. Review of the MIP images confirms the above findings. CT ABDOMEN and PELVIS FINDINGS Hepatobiliary: Gallbladder is partially distended with a few small dependent gallstones. No pericholecystic fluid is noted. The liver shows fatty infiltration. Pancreas: Unremarkable. No pancreatic ductal dilatation or surrounding inflammatory changes. Spleen: Normal in size without focal abnormality. Adrenals/Urinary Tract: Adrenal glands are unremarkable bilaterally. Kidneys are well visualized bilaterally with large bilateral renal cysts. The largest of these on the right measures 8.5 cm. The largest of these on the left measures 13 cm in greatest dimension. Normal enhancement and excretion is seen. Bladder is partially distended. Ureters are within normal limits. No renal calculi are seen. Stomach/Bowel: Scattered diverticular change of the colon is noted. Very mild pericolonic inflammatory changes noted in the proximal sigmoid colon consistent with early diverticulitis. No perforation or abscess formation is noted. The appendix is within normal limits. The remainder of the colon is unremarkable. Stomach is decompressed. Small bowel is unremarkable. Vascular/Lymphatic: Aortic atherosclerosis. No enlarged abdominal or pelvic lymph nodes. Reproductive: Prostate is unremarkable. Other: No abdominal wall hernia or abnormality. No abdominopelvic ascites. Musculoskeletal: Degenerative changes of lumbar spine are noted. No compression deformity is noted. Review of the MIP images confirms the above findings. IMPRESSION: CTA of the chest: No evidence of pulmonary emboli. Mild dependent atelectatic changes. Prior granulomatous disease. Aortic Atherosclerosis (ICD10-I70.0). CT of the abdomen and pelvis: Mild diverticulitis in the sigmoid without evidence of perforation or abscess  formation. Cholelithiasis without complicating factors. Bilateral renal cysts as described. No obstructive changes are noted. Electronically Signed   By: Inez Catalina M.D.   On: 04/14/2020 18:09   CT ABDOMEN PELVIS W CONTRAST  Result Date: 04/14/2020 CLINICAL DATA:  Shortness of breath with fevers and abdominal pain EXAM: CT ANGIOGRAPHY CHEST CT ABDOMEN AND PELVIS WITH CONTRAST TECHNIQUE: Multidetector CT imaging of the chest was performed using the standard protocol during bolus administration of intravenous contrast. Multiplanar CT image reconstructions and MIPs were obtained to evaluate the vascular anatomy. Multidetector CT imaging of the abdomen and pelvis was performed using the standard protocol during bolus administration of intravenous contrast. CONTRAST:  40mL OMNIPAQUE IOHEXOL 350 MG/ML SOLN COMPARISON:  None. FINDINGS: CTA CHEST FINDINGS Cardiovascular: Thoracic aorta demonstrates mild atherosclerotic calcifications. No aneurysmal dilatation is seen. No significant opacification is noted. Coronary calcifications are seen. No cardiac enlargement is noted. The pulmonary artery is well visualized with a normal branching pattern bilaterally. No filling defect to suggest pulmonary embolism is noted. Mediastinum/Nodes: Thoracic inlet is within normal limits. No sizable hilar or mediastinal adenopathy is noted. A few small calcified nodes are seen consistent with prior granulomatous disease. The esophagus is within normal limits. Lungs/Pleura: Mild dependent atelectatic changes are noted bilaterally. Some mosaic attenuation in the lungs is noted consistent with air trapping. No focal confluent infiltrate or sizable effusion is seen. No parenchymal nodules are noted. Calcified granuloma is noted in the right lower lobe. Musculoskeletal: Degenerative changes of the thoracic  spine are noted. No acute rib abnormality is noted. Review of the MIP images confirms the above findings. CT ABDOMEN and PELVIS FINDINGS  Hepatobiliary: Gallbladder is partially distended with a few small dependent gallstones. No pericholecystic fluid is noted. The liver shows fatty infiltration. Pancreas: Unremarkable. No pancreatic ductal dilatation or surrounding inflammatory changes. Spleen: Normal in size without focal abnormality. Adrenals/Urinary Tract: Adrenal glands are unremarkable bilaterally. Kidneys are well visualized bilaterally with large bilateral renal cysts. The largest of these on the right measures 8.5 cm. The largest of these on the left measures 13 cm in greatest dimension. Normal enhancement and excretion is seen. Bladder is partially distended. Ureters are within normal limits. No renal calculi are seen. Stomach/Bowel: Scattered diverticular change of the colon is noted. Very mild pericolonic inflammatory changes noted in the proximal sigmoid colon consistent with early diverticulitis. No perforation or abscess formation is noted. The appendix is within normal limits. The remainder of the colon is unremarkable. Stomach is decompressed. Small bowel is unremarkable. Vascular/Lymphatic: Aortic atherosclerosis. No enlarged abdominal or pelvic lymph nodes. Reproductive: Prostate is unremarkable. Other: No abdominal wall hernia or abnormality. No abdominopelvic ascites. Musculoskeletal: Degenerative changes of lumbar spine are noted. No compression deformity is noted. Review of the MIP images confirms the above findings. IMPRESSION: CTA of the chest: No evidence of pulmonary emboli. Mild dependent atelectatic changes. Prior granulomatous disease. Aortic Atherosclerosis (ICD10-I70.0). CT of the abdomen and pelvis: Mild diverticulitis in the sigmoid without evidence of perforation or abscess formation. Cholelithiasis without complicating factors. Bilateral renal cysts as described. No obstructive changes are noted. Electronically Signed   By: Inez Catalina M.D.   On: 04/14/2020 18:09   A/P: JAYMISON LUBER is an 86 y.o. male with  severe CAD, DM, HTN, HLD, CKD with possibility of choledocholithiasis and potentially evolving cholangitis  -He was discharged 2d ago with LHC showing significant 2v disease and may have underlying unstable angina. Certainly concerning factors moving forward. -Medicine for admission; will need cardiology to weigh in on if stable from cardiac standpoint for general anesthesia and surgery if this were necessary -Needs GI consult -Trend LFTs -Check coags -IV Zosyn; as for 'diverticulitis' this may be a slight over-call clinically as he has no left sided pain and very subtle CT findings; regardless, Zosyn will certainly cover this as well  -We will follow with you  Sharon Mt. Dema Severin, M.D. [facility] Use [email or web address] to contact on call provider

## 2020-04-14 NOTE — ED Notes (Signed)
Attempted report x1. 

## 2020-04-15 DIAGNOSIS — E1165 Type 2 diabetes mellitus with hyperglycemia: Secondary | ICD-10-CM

## 2020-04-15 DIAGNOSIS — K810 Acute cholecystitis: Secondary | ICD-10-CM | POA: Diagnosis not present

## 2020-04-15 DIAGNOSIS — N179 Acute kidney failure, unspecified: Secondary | ICD-10-CM

## 2020-04-15 DIAGNOSIS — K8309 Other cholangitis: Secondary | ICD-10-CM | POA: Diagnosis not present

## 2020-04-15 DIAGNOSIS — I25119 Atherosclerotic heart disease of native coronary artery with unspecified angina pectoris: Secondary | ICD-10-CM | POA: Diagnosis not present

## 2020-04-15 DIAGNOSIS — I10 Essential (primary) hypertension: Secondary | ICD-10-CM | POA: Diagnosis not present

## 2020-04-15 LAB — CBC WITH DIFFERENTIAL/PLATELET
Abs Immature Granulocytes: 0.02 10*3/uL (ref 0.00–0.07)
Basophils Absolute: 0 10*3/uL (ref 0.0–0.1)
Basophils Relative: 1 %
Eosinophils Absolute: 0.2 10*3/uL (ref 0.0–0.5)
Eosinophils Relative: 4 %
HCT: 37 % — ABNORMAL LOW (ref 39.0–52.0)
Hemoglobin: 12.3 g/dL — ABNORMAL LOW (ref 13.0–17.0)
Immature Granulocytes: 0 %
Lymphocytes Relative: 19 %
Lymphs Abs: 1.1 10*3/uL (ref 0.7–4.0)
MCH: 30.3 pg (ref 26.0–34.0)
MCHC: 33.2 g/dL (ref 30.0–36.0)
MCV: 91.1 fL (ref 80.0–100.0)
Monocytes Absolute: 0.9 10*3/uL (ref 0.1–1.0)
Monocytes Relative: 16 %
Neutro Abs: 3.6 10*3/uL (ref 1.7–7.7)
Neutrophils Relative %: 60 %
Platelets: 138 10*3/uL — ABNORMAL LOW (ref 150–400)
RBC: 4.06 MIL/uL — ABNORMAL LOW (ref 4.22–5.81)
RDW: 15 % (ref 11.5–15.5)
WBC: 5.9 10*3/uL (ref 4.0–10.5)
nRBC: 0 % (ref 0.0–0.2)

## 2020-04-15 LAB — CBC
HCT: 36.8 % — ABNORMAL LOW (ref 39.0–52.0)
Hemoglobin: 12.3 g/dL — ABNORMAL LOW (ref 13.0–17.0)
MCH: 30.4 pg (ref 26.0–34.0)
MCHC: 33.4 g/dL (ref 30.0–36.0)
MCV: 90.9 fL (ref 80.0–100.0)
Platelets: 132 10*3/uL — ABNORMAL LOW (ref 150–400)
RBC: 4.05 MIL/uL — ABNORMAL LOW (ref 4.22–5.81)
RDW: 15.2 % (ref 11.5–15.5)
WBC: 5.4 10*3/uL (ref 4.0–10.5)
nRBC: 0 % (ref 0.0–0.2)

## 2020-04-15 LAB — COMPREHENSIVE METABOLIC PANEL
ALT: 236 U/L — ABNORMAL HIGH (ref 0–44)
ALT: 271 U/L — ABNORMAL HIGH (ref 0–44)
AST: 84 U/L — ABNORMAL HIGH (ref 15–41)
AST: 94 U/L — ABNORMAL HIGH (ref 15–41)
Albumin: 2.6 g/dL — ABNORMAL LOW (ref 3.5–5.0)
Albumin: 2.7 g/dL — ABNORMAL LOW (ref 3.5–5.0)
Alkaline Phosphatase: 194 U/L — ABNORMAL HIGH (ref 38–126)
Alkaline Phosphatase: 198 U/L — ABNORMAL HIGH (ref 38–126)
Anion gap: 10 (ref 5–15)
Anion gap: 10 (ref 5–15)
BUN: 24 mg/dL — ABNORMAL HIGH (ref 8–23)
BUN: 25 mg/dL — ABNORMAL HIGH (ref 8–23)
CO2: 22 mmol/L (ref 22–32)
CO2: 24 mmol/L (ref 22–32)
Calcium: 8.4 mg/dL — ABNORMAL LOW (ref 8.9–10.3)
Calcium: 8.5 mg/dL — ABNORMAL LOW (ref 8.9–10.3)
Chloride: 102 mmol/L (ref 98–111)
Chloride: 102 mmol/L (ref 98–111)
Creatinine, Ser: 1.58 mg/dL — ABNORMAL HIGH (ref 0.61–1.24)
Creatinine, Ser: 1.62 mg/dL — ABNORMAL HIGH (ref 0.61–1.24)
GFR, Estimated: 38 mL/min — ABNORMAL LOW (ref >60)
GFR, Estimated: 40 mL/min — ABNORMAL LOW (ref >60)
Glucose, Bld: 184 mg/dL — ABNORMAL HIGH (ref 70–99)
Glucose, Bld: 194 mg/dL — ABNORMAL HIGH (ref 70–99)
Potassium: 3.6 mmol/L (ref 3.5–5.1)
Potassium: 3.6 mmol/L (ref 3.5–5.1)
Sodium: 134 mmol/L — ABNORMAL LOW (ref 135–145)
Sodium: 136 mmol/L (ref 135–145)
Total Bilirubin: 1.5 mg/dL — ABNORMAL HIGH (ref 0.3–1.2)
Total Bilirubin: 1.8 mg/dL — ABNORMAL HIGH (ref 0.3–1.2)
Total Protein: 5.8 g/dL — ABNORMAL LOW (ref 6.5–8.1)
Total Protein: 5.9 g/dL — ABNORMAL LOW (ref 6.5–8.1)

## 2020-04-15 LAB — LACTIC ACID, PLASMA: Lactic Acid, Venous: 1 mmol/L (ref 0.5–1.9)

## 2020-04-15 LAB — GLUCOSE, CAPILLARY
Glucose-Capillary: 171 mg/dL — ABNORMAL HIGH (ref 70–99)
Glucose-Capillary: 157 mg/dL — ABNORMAL HIGH (ref 70–99)
Glucose-Capillary: 259 mg/dL — ABNORMAL HIGH (ref 70–99)
Glucose-Capillary: 296 mg/dL — ABNORMAL HIGH (ref 70–99)

## 2020-04-15 MED ORDER — PANTOPRAZOLE SODIUM 40 MG PO TBEC
40.0000 mg | DELAYED_RELEASE_TABLET | Freq: Every day | ORAL | Status: DC
  Administered 2020-04-15 – 2020-04-17 (×3): 40 mg via ORAL
  Filled 2020-04-15 (×3): qty 1

## 2020-04-15 NOTE — Consult Note (Addendum)
[facility] Consult: 9:10 AM 04/15/2020  LOS: 1 day    Referring Provider: Dr Curly Rim Primary Care Physician:  Tower, Wynelle Fanny, MD Primary Gastroenterologist:  Dr. Loletha Carrow     Reason for Consultation:  Abdominal pain and fever   HPI: Ryan Garcia is a 86 y.o. male.  PMH below.  Hx CAD NSTEMI and stent 06/2017, on low dose ASA, no platelet or AC meds.  Chronic DOE with obesity-hypoventilation syndrome, deconditioning.  IDDM.  CKD takes Vit B12 po daily.     08/2010 colonoscopy for surveillance of adenomatous colon polyps 2006.  3 polyps removed (TAs, no HGD).  Diverticulosis. In 10/2015 Dr Loletha Carrow opined "cardiac risks of sedation outweighed the expected benefits of a surveillance colonoscopy in this patient".  Positive Cologuard test in 2018.  Pt again felt too high risk for colonoscopy, "Despite the positive stool test, I still think this patient is too high risk for colonoscopy. If the patient is felt to be too high risk for colon cancer screening, then a colo-guard test should generally be avoided" per Dr Loletha Carrow 10/2016.        Admission 10/11 - 10/12 with unstable angina.  Cardiac cath: severe 2 V dz, patent RCA stent, normal LVEDP.  No change from prior.  rec medical therapy, if refractory angina consider CABG.     Patient did not reveal a history of diarrhea for 1 or 2 days last week, resolved with Imodium.  After that had pain in his lower abdomen always with inspiration which was not severe.  This has persisted and is moderate at its highest intensity..  He has developed fevers, chills and anorexia.  No nausea, vomiting, no bloody stools.  His wife called his doctors after discharge and was advised to take Tylenol.  Was not able to get appointment to see his PMD so he was sent to urgent care and from there sent  to the ED and admitted to the hospital.  Leading up to the past 7 to 10 days patient has few if any GI symptoms.  Moves his bowels regularly does not suffer heartburn, no dysphagia.  Generally good appetite so this recent anorexia is different for him.  CTAP and chest with contrast.  No PE.  Atelectasis.  Old pulmonary granulomas.  Aortic atherosclerosis.  Mild sigmoid diverticulitis, no perforation or abscess.  Simple cholelithiasis.  Fatty liver.  Renal cysts. MRI/MRCP: Cholelithiasis without gallbladder inflammation.  Mild inflammation, thickening in second duodenum consistent with duodenitis.  Small duodenal diverticulum containing fluid.  No perforation.  No ductal dilatation.  Small hepatic cyst and small hepatic hemangioma.  2.6 cm mass in right kidney C/W neoplasm.  T bili 2.1 >> 1.8.  Alkaline phosphatase 214 >> 198.  AST/ALT 131/322 >> 94/271. Lipase 28.   Glucose 351.  AKI.  WBCs 5.5.  Platelets 132.  Hb 12.3, MCV 90. Troponin I wnl x 2.  Urinalysis unremarkable. Blood clx, no growth thus far.    Thus far patient has been seen by cardiology, general surgery. General surgery and Dr Lyndel Safe  suspect pt passed a CBD and has cholangitis.   Day 2 Zosyn.   No PPI etc at home or currently   Does not drink alcoholic beverages.  Past Medical History:  Diagnosis Date  . AC (acromioclavicular) joint bone spurs    lt ankle  . Allergy    allergic rhinitis  . Anginal pain ([place])   . Arthritis    OA  . BPH (benign prostatic hyperplasia)    microwave tx of prostate  . CAD (coronary artery disease)    a. s/p cath in 2012 showing 70-75% LAD stenosis, 75% D1 stenosis, 75% OM1, 60-70% RCA stenosis, and 80% PDA --> medical therapy pursued b. similar findings by cath in 2016; 06/2017 DES to distal RCA  . Chronic upper back pain   . Diabetes mellitus    type II x8 years  . GERD (gastroesophageal reflux disease)   . HLD (hyperlipidemia)    10/12: TC 208, TG 213, HDL 36, LDL 129  . Hx of skin  cancer, basal cell    many skin cancers removed/under constant treatment.  . Hypertension   . NSTEMI (non-ST elevated myocardial infarction) ([place]) 06/18/2017   DES to distal RCA  . Obstructive sleep apnea on CPAP   . Scoliosis     Past Surgical History:  Procedure Laterality Date  . BREAST SURGERY  2009   breast lump removed benign  . CARDIAC CATHETERIZATION N/A 03/15/2015   Procedure: Left Heart Cath and Coronary Angiography;  Surgeon: Belva Crome, MD;  Location: [facility];  Service: Cardiovascular;  Laterality: N/A;  . CORONARY STENT INTERVENTION N/A 06/18/2017   Procedure: CORONARY STENT INTERVENTION;  Surgeon: Jettie Booze, MD;  Location: [facility];  Service: Cardiovascular;  Laterality: N/A;  RCA  . LEFT HEART CATH AND CORONARY ANGIOGRAPHY N/A 06/18/2017   Procedure: LEFT HEART CATH AND CORONARY ANGIOGRAPHY;  Surgeon: Jettie Booze, MD;  Location: [facility];  Service: Cardiovascular;  Laterality: N/A;  . LEFT HEART CATH AND CORONARY ANGIOGRAPHY N/A 04/12/2020   Procedure: LEFT HEART CATH AND CORONARY ANGIOGRAPHY;  Surgeon: Martinique, Peter M, MD;  Location: [facility];  Service: Cardiovascular;  Laterality: N/A;  . ULTRASOUND GUIDANCE FOR VASCULAR ACCESS  06/18/2017   Procedure: Ultrasound Guidance For Vascular Access;  Surgeon: Jettie Booze, MD;  Location: [facility];  Service: Cardiovascular;;  right radial, right femoral    Prior to Admission medications   Medication Sig Start Date End Date Taking? Authorizing Provider  acetaminophen (TYLENOL) 325 MG tablet Take 650 mg by mouth every 6 (six) hours as needed for mild pain or headache.    Yes [provider]  aspirin 81 MG tablet Take 81 mg by mouth daily.     Yes [provider]  cetirizine (ZYRTEC) 10 MG tablet TAKE 1 TABLET EVERY DAY Patient taking differently: Take 10 mg by mouth daily as needed for allergies.  11/14/16  Yes Tower, Wynelle Fanny, MD    Coenzyme Q10 (CO Q 10 PO) Take 200 mg by mouth daily.   Yes [provider]  Cyanocobalamin (VITAMIN B12) 1000 MCG TBCR Take 1,000 mcg by mouth daily. 04/12/20  Yes Cheryln Manly, NP  fluticasone (FLONASE) 50 MCG/ACT nasal spray Place 1 spray into both nostrils daily as needed for allergies.    Yes [provider]  glipiZIDE (GLUCOTROL) 10 MG tablet TAKE 1 TABLET TWICE DAILY WITH MEALS Patient taking differently: Take 10 mg by  mouth 2 (two) times daily before a meal.  12/15/19  Yes Tower, Marne A, MD  hydrochlorothiazide (HYDRODIURIL) 12.5 MG tablet TAKE 1 TABLET EVERY DAY Patient taking differently: Take 12.5 mg by mouth daily.  03/09/20  Yes Lelon Perla, MD  insulin glargine (LANTUS SOLOSTAR) 100 UNIT/ML Solostar Pen INJECT 60 UNITS SUBCUTANEOUSLY AT BEDTIME Patient taking differently: Inject 50 Units into the skin at bedtime.  01/20/20  Yes Tower, Wynelle Fanny, MD  isosorbide mononitrate (IMDUR) 30 MG 24 hr tablet Take 1 tablet (30 mg total) by mouth daily. 11/26/19  Yes Lelon Perla, MD  lisinopril (ZESTRIL) 20 MG tablet TAKE 1 TABLET EVERY DAY Patient taking differently: Take 20 mg by mouth daily.  11/17/19  Yes Lelon Perla, MD  metFORMIN (GLUCOPHAGE) 1000 MG tablet TAKE 1 TABLET TWICE DAILY Patient taking differently: Take 1,000 mg by mouth 2 (two) times daily with a meal.  03/10/20  Yes Tower, Wynelle Fanny, MD  metoprolol tartrate (LOPRESSOR) 25 MG tablet Take 0.5 tablets (12.5 mg total) by mouth 2 (two) times daily. 11/05/19  Yes Lelon Perla, MD  nitroGLYCERIN (NITROSTAT) 0.4 MG SL tablet Place 1 tablet (0.4 mg total) under the tongue every 5 (five) minutes as needed for chest pain. Patient taking differently: Place 0.4 mg under the tongue every 5 (five) minutes as needed for chest pain (max 3 doses).  05/11/19  Yes Duke, Tami Lin, PA  Blood Glucose Monitoring Suppl (TRUE METRIX AIR GLUCOSE METER) W/DEVICE KIT 1 kit by Other route 2 (two) times daily. Check  blood sugar twice daily and as directed. Dx E11.65 03/17/15   Tower, Wynelle Fanny, MD  DROPLET PEN NEEDLES 31G X 6 MM MISC USE ONE TIME DAILY  AT  BEDTIME  WITH  LANTUS 02/10/20   Tower, Wynelle Fanny, MD  rosuvastatin (CRESTOR) 40 MG tablet Take 40 mg by mouth daily. Patient not taking: Reported on 04/14/2020 03/10/20   [provider]  TRUE METRIX BLOOD GLUCOSE TEST test strip CHECK BLOOD SUGAR TWICE DAILY 12/15/19   Tower, Wynelle Fanny, MD  TRUEPLUS LANCETS 30G MISC Check blood sugar twice daily and as directed. Dx E11.65 03/17/15   TowerWynelle Fanny, MD    Scheduled Meds: . aspirin EC  81 mg Oral Daily  . heparin  5,000 Units Subcutaneous Q8H  . insulin aspart  0-15 Units Subcutaneous TID WC  . insulin aspart  0-5 Units Subcutaneous QHS  . insulin glargine  50 Units Subcutaneous QHS  . isosorbide mononitrate  30 mg Oral Daily  . lisinopril  20 mg Oral Daily  . metoprolol tartrate  25 mg Oral BID   Infusions: . lactated ringers 75 mL/hr at 04/14/20 2317  . piperacillin-tazobactam (ZOSYN)  IV 3.375 g (04/15/20 0552)   PRN Meds: acetaminophen **OR** acetaminophen, nitroGLYCERIN, ondansetron **OR** ondansetron (ZOFRAN) IV   Allergies as of 04/14/2020 - Review Complete 04/14/2020  Allergen Reaction Noted  . Loratadine  11/08/2009  . Simvastatin  09/16/2008    Family History  Problem Relation Age of Onset  . Heart attack Brother   . Colon cancer Neg Hx     Social History   Socioeconomic History  . Marital status: Married    Spouse name: Not on file  . Number of children: Not on file  . Years of education: Not on file  . Highest education level: Not on file  Occupational History  . Not on file  Tobacco Use  . Smoking status: Never Smoker  .  Smokeless tobacco: Never Used  Vaping Use  . Vaping Use: Never used  Substance and Sexual Activity  . Alcohol use: No    Alcohol/week: 0.0 standard drinks  . Drug use: No  . Sexual activity: Not Currently  Other Topics Concern  . Not on  file  Social History Narrative   Married    Physically active hard of hearing   Social Determinants of Health   Financial Resource Strain:   . Difficulty of Paying Living Expenses: Not on file  Food Insecurity:   . Worried About Charity fundraiser in the Last Year: Not on file  . Ran Out of Food in the Last Year: Not on file  Transportation Needs:   . Lack of Transportation (Medical): Not on file  . Lack of Transportation (Non-Medical): Not on file  Physical Activity:   . Days of Exercise per Week: Not on file  . Minutes of Exercise per Session: Not on file  Stress:   . Feeling of Stress : Not on file  Social Connections:   . Frequency of Communication with Friends and Family: Not on file  . Frequency of Social Gatherings with Friends and Family: Not on file  . Attends Religious Services: Not on file  . Active Member of Clubs or Organizations: Not on file  . Attends Archivist Meetings: Not on file  . Marital Status: Not on file  Intimate Partner Violence:   . Fear of Current or Ex-Partner: Not on file  . Emotionally Abused: Not on file  . Physically Abused: Not on file  . Sexually Abused: Not on file    REVIEW OF SYSTEMS: Constitutional: Patient is actually quite active despite his diagnoses.  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  Mows his own lawn with a riding mower.  He has slowed down recently but still is fairly active. ENT:  No nose bleeds.  Chronic HOH Pulm: No dyspnea at rest.  No cough. CV:  No palpitations.  Chronic right >> left LE edema.  No angina currently. GU:  No hematuria, no frequency GI: See HPI. Heme: No unusual or excessive bleeding or bruising. Transfusions: None. Neuro:  No headaches, no peripheral tingling or numbness.  No seizures, no syncope. Derm:  No itching, no rash or sores.  No yellowing of skin. Endocrine:  + sweats or chills.  No polyuria or dysuria Immunization: Not queried. Travel: He and his wife traveled  to [place] where they have a vacation trailer with some regularity.  They were up there about a week ago.   PHYSICAL EXAM: Vital signs in last 24 hours: Vitals:   04/15/20 0457 04/15/20 0828  BP: 140/62 (!) 122/58  Pulse: (!) 53 (!) 50  Resp: 16 18  Temp: (!) 97.5 F (36.4 C) 97.9 F (36.6 C)  SpO2: 96% 97%   Wt Readings from Last 3 Encounters:  04/11/20 113.4 kg  01/18/20 113.7 kg  11/18/19 116.1 kg    General: Patient looks better than expected.  Not toxic and not particularly chronically ill-appearing.  Alert, comfortable Head: No facial asymmetry or swelling.  No signs of head trauma. Eyes: No scleral icterus.  No conjunctival pallor.  EOMI Ears: Hard of hearing Nose: No congestion or discharge. Mouth: Oropharynx moist, pink, clear.  Tongue midline. Neck: No JVD, no masses, no thyromegaly. Lungs: Clear bilaterally.  Reduced breath sounds throughout.  No cough.  Gynecomastia. Heart: RRR.  No MRG.  S1, S2  present Abdomen: Obese, soft, nontender.  No HSM, masses, bruits.  Diastases recti. Rectal: Deferred Musc/Skeltl: No joint redness, swelling or gross deformity. Extremities: Slight pitting edema, right >> left foot Neurologic: Fully alert and oriented.  Hard of hearing.  Overall decreased strength, needs help sitting up from a lying position.  Moves all 4 limbs.  No tremors, gross strength not tested. Skin: No rash, no sores, no significant purpura or bruising.  No telangiectasia. Tattoos: None observed Nodes: No cervical adenopathy Psych: Calm, pleasant, cooperative.  Intake/Output from previous day: No intake/output data recorded. Intake/Output this shift: Total I/O In: -  Out: 500 [Urine:500]  LAB RESULTS: Recent Labs    04/14/20 1442 04/14/20 2342  WBC 5.5 5.4  HGB 12.8* 12.3*  HCT 40.3 36.8*  PLT 144* 132*   BMET Lab Results  Component Value Date   NA 134 (L) 04/14/2020   NA 135 04/14/2020   NA 137 04/12/2020   K 3.6 04/14/2020    K 3.9 04/14/2020   K 4.4 04/12/2020   CL 102 04/14/2020   CL 104 04/14/2020   CL 105 04/12/2020   CO2 22 04/14/2020   CO2 21 (L) 04/14/2020   CO2 21 (L) 04/12/2020   GLUCOSE 194 (H) 04/14/2020   GLUCOSE 351 (H) 04/14/2020   GLUCOSE 212 (H) 04/12/2020   BUN 25 (H) 04/14/2020   BUN 27 (H) 04/14/2020   BUN 28 (H) 04/12/2020   CREATININE 1.62 (H) 04/14/2020   CREATININE 1.91 (H) 04/14/2020   CREATININE 1.45 (H) 04/12/2020   CALCIUM 8.4 (L) 04/14/2020   CALCIUM 8.6 (L) 04/14/2020   CALCIUM 8.8 (L) 04/12/2020   LFT Recent Labs    04/14/20 1711 04/14/20 2342  PROT 6.2* 5.9*  ALBUMIN 3.0* 2.7*  AST 131* 94*  ALT 322* 271*  ALKPHOS 214* 198*  BILITOT 2.1* 1.8*  BILIDIR 1.0*  --   IBILI 1.1*  --    PT/INR Lab Results  Component Value Date   INR 1.03 09/22/2017   INR 1.27 06/18/2017   INR 1.2 (H) 03/14/2015   Hepatitis Panel No results for input(s): HEPBSAG, HCVAB, HEPAIGM, HEPBIGM in the last 72 hours. C-Diff No components found for: CDIFF Lipase     Component Value Date/Time   LIPASE 28 04/14/2020 1711    Drugs of Abuse  No results found for: LABOPIA, COCAINSCRNUR, LABBENZ, AMPHETMU, THCU, LABBARB   RADIOLOGY STUDIES: DG Chest 2 View  Result Date: 04/14/2020 CLINICAL DATA:  Chest pain and fever for 1 day EXAM: CHEST - 2 VIEW COMPARISON:  04/11/2020 FINDINGS: Cardiac shadow is mildly enlarged but stable. The lungs are well aerated bilaterally. Mild atelectatic changes are again noted in the left base laterally. No sizable effusion is noted. No bony abnormality is seen. IMPRESSION: Stable left basilar atelectasis. Electronically Signed   By: Inez Catalina M.D.   On: 04/14/2020 15:27   CT Angio Chest PE W and/or Wo Contrast  Result Date: 04/14/2020 CLINICAL DATA:  Shortness of breath with fevers and abdominal pain EXAM: CT ANGIOGRAPHY CHEST CT ABDOMEN AND PELVIS WITH CONTRAST TECHNIQUE: Multidetector CT imaging of the chest was performed using the standard protocol  during bolus administration of intravenous contrast. Multiplanar CT image reconstructions and MIPs were obtained to evaluate the vascular anatomy. Multidetector CT imaging of the abdomen and pelvis was performed using the standard protocol during bolus administration of intravenous contrast. CONTRAST:  27m OMNIPAQUE IOHEXOL 350 MG/ML SOLN COMPARISON:  None. FINDINGS: CTA CHEST FINDINGS Cardiovascular: Thoracic aorta demonstrates mild  atherosclerotic calcifications. No aneurysmal dilatation is seen. No significant opacification is noted. Coronary calcifications are seen. No cardiac enlargement is noted. The pulmonary artery is well visualized with a normal branching pattern bilaterally. No filling defect to suggest pulmonary embolism is noted. Mediastinum/Nodes: Thoracic inlet is within normal limits. No sizable hilar or mediastinal adenopathy is noted. A few small calcified nodes are seen consistent with prior granulomatous disease. The esophagus is within normal limits. Lungs/Pleura: Mild dependent atelectatic changes are noted bilaterally. Some mosaic attenuation in the lungs is noted consistent with air trapping. No focal confluent infiltrate or sizable effusion is seen. No parenchymal nodules are noted. Calcified granuloma is noted in the right lower lobe. Musculoskeletal: Degenerative changes of the thoracic spine are noted. No acute rib abnormality is noted. Review of the MIP images confirms the above findings. CT ABDOMEN and PELVIS FINDINGS Hepatobiliary: Gallbladder is partially distended with a few small dependent gallstones. No pericholecystic fluid is noted. The liver shows fatty infiltration. Pancreas: Unremarkable. No pancreatic ductal dilatation or surrounding inflammatory changes. Spleen: Normal in size without focal abnormality. Adrenals/Urinary Tract: Adrenal glands are unremarkable bilaterally. Kidneys are well visualized bilaterally with large bilateral renal cysts. The largest of these on the  right measures 8.5 cm. The largest of these on the left measures 13 cm in greatest dimension. Normal enhancement and excretion is seen. Bladder is partially distended. Ureters are within normal limits. No renal calculi are seen. Stomach/Bowel: Scattered diverticular change of the colon is noted. Very mild pericolonic inflammatory changes noted in the proximal sigmoid colon consistent with early diverticulitis. No perforation or abscess formation is noted. The appendix is within normal limits. The remainder of the colon is unremarkable. Stomach is decompressed. Small bowel is unremarkable. Vascular/Lymphatic: Aortic atherosclerosis. No enlarged abdominal or pelvic lymph nodes. Reproductive: Prostate is unremarkable. Other: No abdominal wall hernia or abnormality. No abdominopelvic ascites. Musculoskeletal: Degenerative changes of lumbar spine are noted. No compression deformity is noted. Review of the MIP images confirms the above findings. IMPRESSION: CTA of the chest: No evidence of pulmonary emboli. Mild dependent atelectatic changes. Prior granulomatous disease. Aortic Atherosclerosis (ICD10-I70.0). CT of the abdomen and pelvis: Mild diverticulitis in the sigmoid without evidence of perforation or abscess formation. Cholelithiasis without complicating factors. Bilateral renal cysts as described. No obstructive changes are noted. Electronically Signed   By: Inez Catalina M.D.   On: 04/14/2020 18:09   CT ABDOMEN PELVIS W CONTRAST  Result Date: 04/14/2020 CLINICAL DATA:  Shortness of breath with fevers and abdominal pain EXAM: CT ANGIOGRAPHY CHEST CT ABDOMEN AND PELVIS WITH CONTRAST TECHNIQUE: Multidetector CT imaging of the chest was performed using the standard protocol during bolus administration of intravenous contrast. Multiplanar CT image reconstructions and MIPs were obtained to evaluate the vascular anatomy. Multidetector CT imaging of the abdomen and pelvis was performed using the standard protocol  during bolus administration of intravenous contrast. CONTRAST:  53m OMNIPAQUE IOHEXOL 350 MG/ML SOLN COMPARISON:  None. FINDINGS: CTA CHEST FINDINGS Cardiovascular: Thoracic aorta demonstrates mild atherosclerotic calcifications. No aneurysmal dilatation is seen. No significant opacification is noted. Coronary calcifications are seen. No cardiac enlargement is noted. The pulmonary artery is well visualized with a normal branching pattern bilaterally. No filling defect to suggest pulmonary embolism is noted. Mediastinum/Nodes: Thoracic inlet is within normal limits. No sizable hilar or mediastinal adenopathy is noted. A few small calcified nodes are seen consistent with prior granulomatous disease. The esophagus is within normal limits. Lungs/Pleura: Mild dependent atelectatic changes are noted bilaterally. Some mosaic attenuation  in the lungs is noted consistent with air trapping. No focal confluent infiltrate or sizable effusion is seen. No parenchymal nodules are noted. Calcified granuloma is noted in the right lower lobe. Musculoskeletal: Degenerative changes of the thoracic spine are noted. No acute rib abnormality is noted. Review of the MIP images confirms the above findings. CT ABDOMEN and PELVIS FINDINGS Hepatobiliary: Gallbladder is partially distended with a few small dependent gallstones. No pericholecystic fluid is noted. The liver shows fatty infiltration. Pancreas: Unremarkable. No pancreatic ductal dilatation or surrounding inflammatory changes. Spleen: Normal in size without focal abnormality. Adrenals/Urinary Tract: Adrenal glands are unremarkable bilaterally. Kidneys are well visualized bilaterally with large bilateral renal cysts. The largest of these on the right measures 8.5 cm. The largest of these on the left measures 13 cm in greatest dimension. Normal enhancement and excretion is seen. Bladder is partially distended. Ureters are within normal limits. No renal calculi are seen.  Stomach/Bowel: Scattered diverticular change of the colon is noted. Very mild pericolonic inflammatory changes noted in the proximal sigmoid colon consistent with early diverticulitis. No perforation or abscess formation is noted. The appendix is within normal limits. The remainder of the colon is unremarkable. Stomach is decompressed. Small bowel is unremarkable. Vascular/Lymphatic: Aortic atherosclerosis. No enlarged abdominal or pelvic lymph nodes. Reproductive: Prostate is unremarkable. Other: No abdominal wall hernia or abnormality. No abdominopelvic ascites. Musculoskeletal: Degenerative changes of lumbar spine are noted. No compression deformity is noted. Review of the MIP images confirms the above findings. IMPRESSION: CTA of the chest: No evidence of pulmonary emboli. Mild dependent atelectatic changes. Prior granulomatous disease. Aortic Atherosclerosis (ICD10-I70.0). CT of the abdomen and pelvis: Mild diverticulitis in the sigmoid without evidence of perforation or abscess formation. Cholelithiasis without complicating factors. Bilateral renal cysts as described. No obstructive changes are noted. Electronically Signed   By: Inez Catalina M.D.   On: 04/14/2020 18:09   US Abdomen Limited  Result Date: 04/14/2020 CLINICAL DATA:  Cholelithiasis. EXAM: ULTRASOUND ABDOMEN LIMITED RIGHT UPPER QUADRANT COMPARISON:  CT abdomen and pelvis 04/14/2020 FINDINGS: Gallbladder: Gallstones measuring up to 7 mm. No gallbladder wall thickening. No sonographic Murphy sign noted by sonographer. Common bile duct: Diameter: 3 mm Liver: Diffusely increased parenchymal echogenicity without a focal lesion identified. Portal vein is patent on color Doppler imaging with normal direction of blood flow towards the liver. Other: Incidental 2.4 cm complex, partially hyperechoic mass in the upper pole of the right kidney which appears solid with some blood flow on color Doppler imaging. IMPRESSION: 1. Cholelithiasis without evidence  of cholecystitis. 2. 2.4 cm right upper pole renal mass highly suspicious for neoplasm. Consider nonemergent abdominal MRI without and with contrast (preferably as an outpatient) for further evaluation. 3. Echogenic liver suggestive of steatosis. Electronically Signed   By: Logan Bores M.D.   On: 04/14/2020 20:33   MR ABDOMEN MRCP W WO CONTAST  Result Date: 04/14/2020 CLINICAL DATA:  Cholelithiasis EXAM: MRI ABDOMEN WITHOUT AND WITH CONTRAST (INCLUDING MRCP) TECHNIQUE: Multiplanar multisequence MR imaging of the abdomen was performed both before and after the administration of intravenous contrast. Heavily T2-weighted images of the biliary and pancreatic ducts were obtained, and three-dimensional MRCP images were rendered by post processing. CONTRAST:  9.53m GADAVIST GADOBUTROL 1 MMOL/ML IV SOLN COMPARISON:  CT and ultrasound from earlier in the same day. FINDINGS: Lower chest: No focal infiltrate or sizable effusion is noted. Hepatobiliary: Tiny cyst is noted along the anterior margin of the right lobe of the liver measuring less than 1 cm  better visualized than on the prior CT examination. Hypointense area is noted in the tip of the right lobe of the liver better visualized than on prior CT which demonstrates some peripheral enhancement and filling in on delayed post gadolinium images consistent with a small hemangioma. This measures approximate with 1.4 cm in dimension. No other areas of abnormal enhancement are noted within the liver. Gallbladder is well visualized and demonstrates dependent cholelithiasis similar to that seen on prior CT and ultrasound. The biliary tree is within normal limits. No findings to suggest choledocholithiasis are seen. Pancreas:  Pancreas is well visualized and within normal limits. Spleen:  Within normal limits in size and appearance. Adrenals/Urinary Tract: Adrenal glands are within normal limits. Multiple renal cysts are again identified similar to that seen on prior CT  examination. Hypointense lesion is noted in the upper pole of right kidney measuring approximately 2.6 cm. Following contrast administration there is irregular enhancement identified consistent with a renal cell neoplasm. No obstructive changes are seen. Bladder is not visualized on these images. Stomach/Bowel: There is some mild inflammatory change surrounding the second portion the duodenum better visualized than on the prior CT examination. This is consistent with duodenitis. No findings to suggest perforation are noted. Small duodenal diverticulum is noted with fluid within adjacent to the ampulla of Vater. The remainder of the visualized small bowel appears within normal limits. Visualized colon is unremarkable. The previously seen inflammatory changes in the sigmoid are not evaluated on this exam. Vascular/Lymphatic: No aneurysmal dilatation is seen. No significant lymphadenopathy is noted. Other:  No free fluid is noted. Musculoskeletal: Visualized bony structures show no acute abnormality. IMPRESSION: Changes consistent with cholelithiasis. No choledocholithiasis is seen. No gallbladder inflammatory changes noted. Thickening and mild inflammatory changes in the second portion the duodenum consistent with duodenitis. A small duodenal diverticulum is identified with fluid within. No definitive perforation is seen. This may contribute to the patient's elevated LFTs although no biliary ductal dilatation is seen. 2.6 cm enhancing mass lesion in the upper pole of the right kidney as described consistent with renal neoplasm. Small hepatic cyst and small hepatic hemangioma. Electronically Signed   By: Inez Catalina M.D.   On: 04/14/2020 23:17     IMPRESSION:   *  Fever and abd pain.  Elevated but improving LFTs.   Sigmoid diverticulitis.   Cholelithiasis, ? Passed CBD stone, ? cholangitis  *    Duodenitis, duodenal diverticulum w/o diverticulitis.    *    R kidney mass, reading as neoplasm per MRI.    *     CAD, severe 2 V dz.  Previous cardiac stent, not on Plavix.   Recent admission and cath for USAP.  Medical RX.  No angina currently and negative Troponins.    *    AKI, CKD.    *   Hx adenomatous colon polyps and positive Cologuard.  Dr. Loletha Carrow feels cardiac risk too high to pursue diagnostic/surveillance colonoscopy.    PLAN:     *   10 d of abx, Zosyn currently d 2  *   Added Protonix 40 mg daily, should take for at least 3 months, if not life long.    *   ? Urology consult?    *   Allow diabetic diet.     Azucena Freed  04/15/2020, 9:10 AM Phone [phone]   Attending physician's note   I have taken an interval history, reviewed the chart and examined the patient. I agree with  the Advanced Practitioner's note, impression and recommendations.   Have reviewed extensive notes, labs, CT Abdo/pelvis and MRCP from yesterday.  He likely had ascending cholangitis.  Fortunately, he has passed the CBD stone (as evident clinically, decreasing LFTs, recent CT/MRCP) and has responded well to antibiotics. Not a candidate for lap chole/ERCP d/t cardiac disease per cardiology.  Hence, would suggest conservative management. I doubt if he had acute diverticulitis.  Plan: -Would recommend antibiotics x 10 days. -Advance diet to low-fat diabetic diet. -Ambulate -Trend CBC, LFTs -FU with Dr. Loletha Carrow as outpatient in 2-3 weeks. -D/W surgery and with pt. -Pl call if with any ?Marland Kitchen   Carmell Austria, MD Velora Heckler Fabienne Bruns [phone].

## 2020-04-15 NOTE — Progress Notes (Addendum)
Progress Note  Patient Name: Ryan Garcia Date of Encounter: 04/15/2020  [facility] Cardiologist: Kirk Ruths, MD   Subjective   No chest pain or dyspnea  Inpatient Medications    Scheduled Meds: . aspirin EC  81 mg Oral Daily  . heparin  5,000 Units Subcutaneous Q8H  . insulin aspart  0-15 Units Subcutaneous TID WC  . insulin aspart  0-5 Units Subcutaneous QHS  . insulin glargine  50 Units Subcutaneous QHS  . isosorbide mononitrate  30 mg Oral Daily  . lisinopril  20 mg Oral Daily  . metoprolol tartrate  25 mg Oral BID  . pantoprazole  40 mg Oral Q0600   Continuous Infusions: . lactated ringers 75 mL/hr at 04/14/20 2317  . piperacillin-tazobactam (ZOSYN)  IV 3.375 g (04/15/20 0552)   PRN Meds: acetaminophen **OR** acetaminophen, nitroGLYCERIN, ondansetron **OR** ondansetron (ZOFRAN) IV   Vital Signs    Vitals:   04/15/20 0159 04/15/20 0210 04/15/20 0457 04/15/20 0828  BP: 127/62  140/62 (!) 122/58  Pulse: 63 85 (!) 53 (!) 50  Resp: 17 18 16 18   Temp: 98.2 F (36.8 C)  (!) 97.5 F (36.4 C) 97.9 F (36.6 C)  TempSrc: Oral  Oral   SpO2: 96% 96% 96% 97%    Intake/Output Summary (Last 24 hours) at 04/15/2020 1017 Last data filed at 04/15/2020 0845 Gross per 24 hour  Intake --  Output 500 ml  Net -500 ml   Last 3 Weights 04/11/2020 01/18/2020 11/18/2019  Weight (lbs) 250 lb 250 lb 9 oz 256 lb  Weight (kg) 113.399 kg 113.654 kg 116.121 kg      Telemetry    Not on telemetry  ECG    No new tracing   Physical Exam   GEN: No acute distress.   Neck: No JVD Cardiac: RRR, no murmurs, rubs, or gallops.  Respiratory: Clear to auscultation bilaterally. GI: Soft, nontender, non-distended  MS: No edema; No deformity. Neuro:  Nonfocal  Psych: Normal affect   Labs    High Sensitivity Troponin:   Recent Labs  Lab 04/11/20 1437 04/11/20 1709 04/14/20 1442 04/14/20 1711  TROPONINIHS 8 7 14 13       Chemistry Recent Labs  Lab 04/11/20 1437  04/14/20 1442 04/14/20 1711 04/14/20 2342 04/15/20 0857  NA  --  135  --  134* 136  K  --  3.9  --  3.6 3.6  CL  --  104  --  102 102  CO2  --  21*  --  22 24  GLUCOSE  --  351*  --  194* 184*  BUN  --  27*  --  25* 24*  CREATININE  --  1.91*  --  1.62* 1.58*  CALCIUM  --  8.6*  --  8.4* 8.5*  PROT   < >  --  6.2* 5.9* 5.8*  ALBUMIN   < >  --  3.0* 2.7* 2.6*  AST   < >  --  131* 94* 84*  ALT   < >  --  322* 271* 236*  ALKPHOS   < >  --  214* 198* 194*  BILITOT   < >  --  2.1* 1.8* 1.5*  GFRNONAA  --  31*  --  38* 40*  ANIONGAP  --  10  --  10 10   < > = values in this interval not displayed.     Hematology Recent Labs  Lab 04/14/20 1442 04/14/20 2342 04/15/20  0857  WBC 5.5 5.4 5.9  RBC 4.30 4.05* 4.06*  HGB 12.8* 12.3* 12.3*  HCT 40.3 36.8* 37.0*  MCV 93.7 90.9 91.1  MCH 29.8 30.4 30.3  MCHC 31.8 33.4 33.2  RDW 15.2 15.2 15.0  PLT 144* 132* 138*    BNPNo results for input(s): BNP, PROBNP in the last 168 hours.   DDimer No results for input(s): DDIMER in the last 168 hours.   Radiology    DG Chest 2 View  Result Date: 04/14/2020 CLINICAL DATA:  Chest pain and fever for 1 day EXAM: CHEST - 2 VIEW COMPARISON:  04/11/2020 FINDINGS: Cardiac shadow is mildly enlarged but stable. The lungs are well aerated bilaterally. Mild atelectatic changes are again noted in the left base laterally. No sizable effusion is noted. No bony abnormality is seen. IMPRESSION: Stable left basilar atelectasis. Electronically Signed   By: Inez Catalina M.D.   On: 04/14/2020 15:27   CT Angio Chest PE W and/or Wo Contrast  Result Date: 04/14/2020 CLINICAL DATA:  Shortness of breath with fevers and abdominal pain EXAM: CT ANGIOGRAPHY CHEST CT ABDOMEN AND PELVIS WITH CONTRAST TECHNIQUE: Multidetector CT imaging of the chest was performed using the standard protocol during bolus administration of intravenous contrast. Multiplanar CT image reconstructions and MIPs were obtained to evaluate the  vascular anatomy. Multidetector CT imaging of the abdomen and pelvis was performed using the standard protocol during bolus administration of intravenous contrast. CONTRAST:  45mL OMNIPAQUE IOHEXOL 350 MG/ML SOLN COMPARISON:  None. FINDINGS: CTA CHEST FINDINGS Cardiovascular: Thoracic aorta demonstrates mild atherosclerotic calcifications. No aneurysmal dilatation is seen. No significant opacification is noted. Coronary calcifications are seen. No cardiac enlargement is noted. The pulmonary artery is well visualized with a normal branching pattern bilaterally. No filling defect to suggest pulmonary embolism is noted. Mediastinum/Nodes: Thoracic inlet is within normal limits. No sizable hilar or mediastinal adenopathy is noted. A few small calcified nodes are seen consistent with prior granulomatous disease. The esophagus is within normal limits. Lungs/Pleura: Mild dependent atelectatic changes are noted bilaterally. Some mosaic attenuation in the lungs is noted consistent with air trapping. No focal confluent infiltrate or sizable effusion is seen. No parenchymal nodules are noted. Calcified granuloma is noted in the right lower lobe. Musculoskeletal: Degenerative changes of the thoracic spine are noted. No acute rib abnormality is noted. Review of the MIP images confirms the above findings. CT ABDOMEN and PELVIS FINDINGS Hepatobiliary: Gallbladder is partially distended with a few small dependent gallstones. No pericholecystic fluid is noted. The liver shows fatty infiltration. Pancreas: Unremarkable. No pancreatic ductal dilatation or surrounding inflammatory changes. Spleen: Normal in size without focal abnormality. Adrenals/Urinary Tract: Adrenal glands are unremarkable bilaterally. Kidneys are well visualized bilaterally with large bilateral renal cysts. The largest of these on the right measures 8.5 cm. The largest of these on the left measures 13 cm in greatest dimension. Normal enhancement and excretion is  seen. Bladder is partially distended. Ureters are within normal limits. No renal calculi are seen. Stomach/Bowel: Scattered diverticular change of the colon is noted. Very mild pericolonic inflammatory changes noted in the proximal sigmoid colon consistent with early diverticulitis. No perforation or abscess formation is noted. The appendix is within normal limits. The remainder of the colon is unremarkable. Stomach is decompressed. Small bowel is unremarkable. Vascular/Lymphatic: Aortic atherosclerosis. No enlarged abdominal or pelvic lymph nodes. Reproductive: Prostate is unremarkable. Other: No abdominal wall hernia or abnormality. No abdominopelvic ascites. Musculoskeletal: Degenerative changes of lumbar spine are noted. No compression deformity is  noted. Review of the MIP images confirms the above findings. IMPRESSION: CTA of the chest: No evidence of pulmonary emboli. Mild dependent atelectatic changes. Prior granulomatous disease. Aortic Atherosclerosis (ICD10-I70.0). CT of the abdomen and pelvis: Mild diverticulitis in the sigmoid without evidence of perforation or abscess formation. Cholelithiasis without complicating factors. Bilateral renal cysts as described. No obstructive changes are noted. Electronically Signed   By: Inez Catalina M.D.   On: 04/14/2020 18:09   CT ABDOMEN PELVIS W CONTRAST  Result Date: 04/14/2020 CLINICAL DATA:  Shortness of breath with fevers and abdominal pain EXAM: CT ANGIOGRAPHY CHEST CT ABDOMEN AND PELVIS WITH CONTRAST TECHNIQUE: Multidetector CT imaging of the chest was performed using the standard protocol during bolus administration of intravenous contrast. Multiplanar CT image reconstructions and MIPs were obtained to evaluate the vascular anatomy. Multidetector CT imaging of the abdomen and pelvis was performed using the standard protocol during bolus administration of intravenous contrast. CONTRAST:  45mL OMNIPAQUE IOHEXOL 350 MG/ML SOLN COMPARISON:  None. FINDINGS: CTA  CHEST FINDINGS Cardiovascular: Thoracic aorta demonstrates mild atherosclerotic calcifications. No aneurysmal dilatation is seen. No significant opacification is noted. Coronary calcifications are seen. No cardiac enlargement is noted. The pulmonary artery is well visualized with a normal branching pattern bilaterally. No filling defect to suggest pulmonary embolism is noted. Mediastinum/Nodes: Thoracic inlet is within normal limits. No sizable hilar or mediastinal adenopathy is noted. A few small calcified nodes are seen consistent with prior granulomatous disease. The esophagus is within normal limits. Lungs/Pleura: Mild dependent atelectatic changes are noted bilaterally. Some mosaic attenuation in the lungs is noted consistent with air trapping. No focal confluent infiltrate or sizable effusion is seen. No parenchymal nodules are noted. Calcified granuloma is noted in the right lower lobe. Musculoskeletal: Degenerative changes of the thoracic spine are noted. No acute rib abnormality is noted. Review of the MIP images confirms the above findings. CT ABDOMEN and PELVIS FINDINGS Hepatobiliary: Gallbladder is partially distended with a few small dependent gallstones. No pericholecystic fluid is noted. The liver shows fatty infiltration. Pancreas: Unremarkable. No pancreatic ductal dilatation or surrounding inflammatory changes. Spleen: Normal in size without focal abnormality. Adrenals/Urinary Tract: Adrenal glands are unremarkable bilaterally. Kidneys are well visualized bilaterally with large bilateral renal cysts. The largest of these on the right measures 8.5 cm. The largest of these on the left measures 13 cm in greatest dimension. Normal enhancement and excretion is seen. Bladder is partially distended. Ureters are within normal limits. No renal calculi are seen. Stomach/Bowel: Scattered diverticular change of the colon is noted. Very mild pericolonic inflammatory changes noted in the proximal sigmoid colon  consistent with early diverticulitis. No perforation or abscess formation is noted. The appendix is within normal limits. The remainder of the colon is unremarkable. Stomach is decompressed. Small bowel is unremarkable. Vascular/Lymphatic: Aortic atherosclerosis. No enlarged abdominal or pelvic lymph nodes. Reproductive: Prostate is unremarkable. Other: No abdominal wall hernia or abnormality. No abdominopelvic ascites. Musculoskeletal: Degenerative changes of lumbar spine are noted. No compression deformity is noted. Review of the MIP images confirms the above findings. IMPRESSION: CTA of the chest: No evidence of pulmonary emboli. Mild dependent atelectatic changes. Prior granulomatous disease. Aortic Atherosclerosis (ICD10-I70.0). CT of the abdomen and pelvis: Mild diverticulitis in the sigmoid without evidence of perforation or abscess formation. Cholelithiasis without complicating factors. Bilateral renal cysts as described. No obstructive changes are noted. Electronically Signed   By: Inez Catalina M.D.   On: 04/14/2020 18:09   US Abdomen Limited  Result Date: 04/14/2020 CLINICAL  DATA:  Cholelithiasis. EXAM: ULTRASOUND ABDOMEN LIMITED RIGHT UPPER QUADRANT COMPARISON:  CT abdomen and pelvis 04/14/2020 FINDINGS: Gallbladder: Gallstones measuring up to 7 mm. No gallbladder wall thickening. No sonographic Murphy sign noted by sonographer. Common bile duct: Diameter: 3 mm Liver: Diffusely increased parenchymal echogenicity without a focal lesion identified. Portal vein is patent on color Doppler imaging with normal direction of blood flow towards the liver. Other: Incidental 2.4 cm complex, partially hyperechoic mass in the upper pole of the right kidney which appears solid with some blood flow on color Doppler imaging. IMPRESSION: 1. Cholelithiasis without evidence of cholecystitis. 2. 2.4 cm right upper pole renal mass highly suspicious for neoplasm. Consider nonemergent abdominal MRI without and with  contrast (preferably as an outpatient) for further evaluation. 3. Echogenic liver suggestive of steatosis. Electronically Signed   By: Logan Bores M.D.   On: 04/14/2020 20:33   MR ABDOMEN MRCP W WO CONTAST  Result Date: 04/14/2020 CLINICAL DATA:  Cholelithiasis EXAM: MRI ABDOMEN WITHOUT AND WITH CONTRAST (INCLUDING MRCP) TECHNIQUE: Multiplanar multisequence MR imaging of the abdomen was performed both before and after the administration of intravenous contrast. Heavily T2-weighted images of the biliary and pancreatic ducts were obtained, and three-dimensional MRCP images were rendered by post processing. CONTRAST:  9.9mL GADAVIST GADOBUTROL 1 MMOL/ML IV SOLN COMPARISON:  CT and ultrasound from earlier in the same day. FINDINGS: Lower chest: No focal infiltrate or sizable effusion is noted. Hepatobiliary: Tiny cyst is noted along the anterior margin of the right lobe of the liver measuring less than 1 cm better visualized than on the prior CT examination. Hypointense area is noted in the tip of the right lobe of the liver better visualized than on prior CT which demonstrates some peripheral enhancement and filling in on delayed post gadolinium images consistent with a small hemangioma. This measures approximate with 1.4 cm in dimension. No other areas of abnormal enhancement are noted within the liver. Gallbladder is well visualized and demonstrates dependent cholelithiasis similar to that seen on prior CT and ultrasound. The biliary tree is within normal limits. No findings to suggest choledocholithiasis are seen. Pancreas:  Pancreas is well visualized and within normal limits. Spleen:  Within normal limits in size and appearance. Adrenals/Urinary Tract: Adrenal glands are within normal limits. Multiple renal cysts are again identified similar to that seen on prior CT examination. Hypointense lesion is noted in the upper pole of right kidney measuring approximately 2.6 cm. Following contrast administration  there is irregular enhancement identified consistent with a renal cell neoplasm. No obstructive changes are seen. Bladder is not visualized on these images. Stomach/Bowel: There is some mild inflammatory change surrounding the second portion the duodenum better visualized than on the prior CT examination. This is consistent with duodenitis. No findings to suggest perforation are noted. Small duodenal diverticulum is noted with fluid within adjacent to the ampulla of Vater. The remainder of the visualized small bowel appears within normal limits. Visualized colon is unremarkable. The previously seen inflammatory changes in the sigmoid are not evaluated on this exam. Vascular/Lymphatic: No aneurysmal dilatation is seen. No significant lymphadenopathy is noted. Other:  No free fluid is noted. Musculoskeletal: Visualized bony structures show no acute abnormality. IMPRESSION: Changes consistent with cholelithiasis. No choledocholithiasis is seen. No gallbladder inflammatory changes noted. Thickening and mild inflammatory changes in the second portion the duodenum consistent with duodenitis. A small duodenal diverticulum is identified with fluid within. No definitive perforation is seen. This may contribute to the patient's elevated LFTs although no  biliary ductal dilatation is seen. 2.6 cm enhancing mass lesion in the upper pole of the right kidney as described consistent with renal neoplasm. Small hepatic cyst and small hepatic hemangioma. Electronically Signed   By: Inez Catalina M.D.   On: 04/14/2020 23:17    Cardiac Studies   LEFT HEART CATH AND CORONARY ANGIOGRAPHY  Conclusion    Prox LAD lesion is 85% stenosed.  Ost 1st Diag to 1st Diag lesion is 85% stenosed.  Dist LAD lesion is 65% stenosed.  Prox LAD to Mid LAD lesion is 75% stenosed.  Ost Cx to Mid Cx lesion is 80% stenosed.  Ost 1st Mrg to 1st Mrg lesion is 95% stenosed.  Mid Cx to Dist Cx lesion is 65% stenosed.  2nd Mrg lesion is 75%  stenosed.  RPDA lesion is 60% stenosed.  1st RPL lesion is 70% stenosed.  Prox RCA to Mid RCA lesion is 30% stenosed.  Previously placed Dist RCA drug eluting stent is widely patent.  Balloon angioplasty was performed.  LV end diastolic pressure is normal.   1. Severe 2 vessel obstructive CAD 2. Patent stent in the RCA 3. Normal LVEDP  Plan: the severe disease in the left coronary distribution is unchanged from prior. No suitable targets for PCI. Recommend continued medical therapy. If refractory angina would need to consider CABG.  Diagnostic Dominance: Right   Patient Profile     86 y.o. male with a hx of CAD with 3V disease treated medically after cath 2012 and 2016 (wanted to avoid CABG), NSTEMI 06/2017 with RCA DES, OSA on CPAP, HTN, DM2, chronic DOE, occasional pedal edema who admitted to [facility] from 10/11 through 04/12/2020 after presenting with chest pain. Cath showed showed severe multivessel disease, unchanged from prior cath with no targets for PCI.  Medical management was recommended.  After discharge he reported onset of fevers up to 103F and abdominal pain, prompting evaluation. Cardiology asked to see the patient for surgical clearance.   Assessment & Plan    1. CAD/Surgical clearance - Recent cath as above. No recurrent angina.  - Continue ASA 81mg  qd, Imdur 30mg  qd, Lopressor 25mg  BID and Lisinopril 20mg  qd  - Home Crestor 40mg  on hold due to elevated LFTs - He is high risk for surgery given unrevascularized multivessel CAD  2. Possible cholangitis - Felt he passed CBD stone  - responding well to antibiotics - Per GI  3. Right Renal mass - Per primary team    For questions or updates, please contact [facility] Please consult [email or web address] for contact info under        Signed, Leanor Kail, PA  04/15/2020, 10:17 AM    I have personally seen and examined this patient. I agree with the assessment and plan as outlined above.  He is feeling  better today.  No chest pain It appears that he will not need surgery He is high risk for any surgical procedure but if surgery were necessary, he could proceed.   We will sign off. Please call our on call team with questions.   Lauree Chandler 04/15/2020 11:25 AM

## 2020-04-15 NOTE — Progress Notes (Signed)
Placed patient on CPAP for the night via auto-mode.  

## 2020-04-15 NOTE — Progress Notes (Signed)
Subjective: Patient denies any pain this morning.  No nausea or vomiting.  Had been having some fevers at home since discharge for heart cath a couple of days ago.    ROS: See above, otherwise other systems negative  Objective: Vital signs in last 24 hours: Temp:  [97.5 F (36.4 C)-100.8 F (38.2 C)] 97.9 F (36.6 C) (10/15 0828) Pulse Rate:  [50-97] 50 (10/15 0828) Resp:  [16-23] 18 (10/15 0828) BP: (122-151)/(55-73) 122/58 (10/15 0828) SpO2:  [94 %-97 %] 97 % (10/15 0828) Last BM Date: 04/13/20  Intake/Output from previous day: No intake/output data recorded. Intake/Output this shift: No intake/output data recorded.  PE: Abd: soft, minimally tender in RUQ, no tender in LLQ, +BS, ND  Lab Results:  Recent Labs    04/14/20 1442 04/14/20 2342  WBC 5.5 5.4  HGB 12.8* 12.3*  HCT 40.3 36.8*  PLT 144* 132*   BMET Recent Labs    04/14/20 1442 04/14/20 2342  NA 135 134*  K 3.9 3.6  CL 104 102  CO2 21* 22  GLUCOSE 351* 194*  BUN 27* 25*  CREATININE 1.91* 1.62*  CALCIUM 8.6* 8.4*   PT/INR No results for input(s): LABPROT, INR in the last 72 hours. CMP     Component Value Date/Time   NA 134 (L) 04/14/2020 2342   NA 144 10/07/2017 1024   K 3.6 04/14/2020 2342   CL 102 04/14/2020 2342   CO2 22 04/14/2020 2342   GLUCOSE 194 (H) 04/14/2020 2342   BUN 25 (H) 04/14/2020 2342   BUN 18 10/07/2017 1024   CREATININE 1.62 (H) 04/14/2020 2342   CALCIUM 8.4 (L) 04/14/2020 2342   PROT 5.9 (L) 04/14/2020 2342   PROT 6.5 09/15/2019 0810   ALBUMIN 2.7 (L) 04/14/2020 2342   ALBUMIN 4.1 09/15/2019 0810   AST 94 (H) 04/14/2020 2342   ALT 271 (H) 04/14/2020 2342   ALKPHOS 198 (H) 04/14/2020 2342   BILITOT 1.8 (H) 04/14/2020 2342   BILITOT 0.3 09/15/2019 0810   GFRNONAA 38 (L) 04/14/2020 2342   GFRAA >60 07/03/2018 0304   Lipase     Component Value Date/Time   LIPASE 28 04/14/2020 1711       Studies/Results: DG Chest 2 View  Result Date:  04/14/2020 CLINICAL DATA:  Chest pain and fever for 1 day EXAM: CHEST - 2 VIEW COMPARISON:  04/11/2020 FINDINGS: Cardiac shadow is mildly enlarged but stable. The lungs are well aerated bilaterally. Mild atelectatic changes are again noted in the left base laterally. No sizable effusion is noted. No bony abnormality is seen. IMPRESSION: Stable left basilar atelectasis. Electronically Signed   By: Inez Catalina M.D.   On: 04/14/2020 15:27   CT Angio Chest PE W and/or Wo Contrast  Result Date: 04/14/2020 CLINICAL DATA:  Shortness of breath with fevers and abdominal pain EXAM: CT ANGIOGRAPHY CHEST CT ABDOMEN AND PELVIS WITH CONTRAST TECHNIQUE: Multidetector CT imaging of the chest was performed using the standard protocol during bolus administration of intravenous contrast. Multiplanar CT image reconstructions and MIPs were obtained to evaluate the vascular anatomy. Multidetector CT imaging of the abdomen and pelvis was performed using the standard protocol during bolus administration of intravenous contrast. CONTRAST:  67mL OMNIPAQUE IOHEXOL 350 MG/ML SOLN COMPARISON:  None. FINDINGS: CTA CHEST FINDINGS Cardiovascular: Thoracic aorta demonstrates mild atherosclerotic calcifications. No aneurysmal dilatation is seen. No significant opacification is noted. Coronary calcifications are seen. No cardiac enlargement is noted. The pulmonary artery is well visualized  with a normal branching pattern bilaterally. No filling defect to suggest pulmonary embolism is noted. Mediastinum/Nodes: Thoracic inlet is within normal limits. No sizable hilar or mediastinal adenopathy is noted. A few small calcified nodes are seen consistent with prior granulomatous disease. The esophagus is within normal limits. Lungs/Pleura: Mild dependent atelectatic changes are noted bilaterally. Some mosaic attenuation in the lungs is noted consistent with air trapping. No focal confluent infiltrate or sizable effusion is seen. No parenchymal  nodules are noted. Calcified granuloma is noted in the right lower lobe. Musculoskeletal: Degenerative changes of the thoracic spine are noted. No acute rib abnormality is noted. Review of the MIP images confirms the above findings. CT ABDOMEN and PELVIS FINDINGS Hepatobiliary: Gallbladder is partially distended with a few small dependent gallstones. No pericholecystic fluid is noted. The liver shows fatty infiltration. Pancreas: Unremarkable. No pancreatic ductal dilatation or surrounding inflammatory changes. Spleen: Normal in size without focal abnormality. Adrenals/Urinary Tract: Adrenal glands are unremarkable bilaterally. Kidneys are well visualized bilaterally with large bilateral renal cysts. The largest of these on the right measures 8.5 cm. The largest of these on the left measures 13 cm in greatest dimension. Normal enhancement and excretion is seen. Bladder is partially distended. Ureters are within normal limits. No renal calculi are seen. Stomach/Bowel: Scattered diverticular change of the colon is noted. Very mild pericolonic inflammatory changes noted in the proximal sigmoid colon consistent with early diverticulitis. No perforation or abscess formation is noted. The appendix is within normal limits. The remainder of the colon is unremarkable. Stomach is decompressed. Small bowel is unremarkable. Vascular/Lymphatic: Aortic atherosclerosis. No enlarged abdominal or pelvic lymph nodes. Reproductive: Prostate is unremarkable. Other: No abdominal wall hernia or abnormality. No abdominopelvic ascites. Musculoskeletal: Degenerative changes of lumbar spine are noted. No compression deformity is noted. Review of the MIP images confirms the above findings. IMPRESSION: CTA of the chest: No evidence of pulmonary emboli. Mild dependent atelectatic changes. Prior granulomatous disease. Aortic Atherosclerosis (ICD10-I70.0). CT of the abdomen and pelvis: Mild diverticulitis in the sigmoid without evidence of  perforation or abscess formation. Cholelithiasis without complicating factors. Bilateral renal cysts as described. No obstructive changes are noted. Electronically Signed   By: Inez Catalina M.D.   On: 04/14/2020 18:09   CT ABDOMEN PELVIS W CONTRAST  Result Date: 04/14/2020 CLINICAL DATA:  Shortness of breath with fevers and abdominal pain EXAM: CT ANGIOGRAPHY CHEST CT ABDOMEN AND PELVIS WITH CONTRAST TECHNIQUE: Multidetector CT imaging of the chest was performed using the standard protocol during bolus administration of intravenous contrast. Multiplanar CT image reconstructions and MIPs were obtained to evaluate the vascular anatomy. Multidetector CT imaging of the abdomen and pelvis was performed using the standard protocol during bolus administration of intravenous contrast. CONTRAST:  4mL OMNIPAQUE IOHEXOL 350 MG/ML SOLN COMPARISON:  None. FINDINGS: CTA CHEST FINDINGS Cardiovascular: Thoracic aorta demonstrates mild atherosclerotic calcifications. No aneurysmal dilatation is seen. No significant opacification is noted. Coronary calcifications are seen. No cardiac enlargement is noted. The pulmonary artery is well visualized with a normal branching pattern bilaterally. No filling defect to suggest pulmonary embolism is noted. Mediastinum/Nodes: Thoracic inlet is within normal limits. No sizable hilar or mediastinal adenopathy is noted. A few small calcified nodes are seen consistent with prior granulomatous disease. The esophagus is within normal limits. Lungs/Pleura: Mild dependent atelectatic changes are noted bilaterally. Some mosaic attenuation in the lungs is noted consistent with air trapping. No focal confluent infiltrate or sizable effusion is seen. No parenchymal nodules are noted. Calcified granuloma is noted  in the right lower lobe. Musculoskeletal: Degenerative changes of the thoracic spine are noted. No acute rib abnormality is noted. Review of the MIP images confirms the above findings. CT  ABDOMEN and PELVIS FINDINGS Hepatobiliary: Gallbladder is partially distended with a few small dependent gallstones. No pericholecystic fluid is noted. The liver shows fatty infiltration. Pancreas: Unremarkable. No pancreatic ductal dilatation or surrounding inflammatory changes. Spleen: Normal in size without focal abnormality. Adrenals/Urinary Tract: Adrenal glands are unremarkable bilaterally. Kidneys are well visualized bilaterally with large bilateral renal cysts. The largest of these on the right measures 8.5 cm. The largest of these on the left measures 13 cm in greatest dimension. Normal enhancement and excretion is seen. Bladder is partially distended. Ureters are within normal limits. No renal calculi are seen. Stomach/Bowel: Scattered diverticular change of the colon is noted. Very mild pericolonic inflammatory changes noted in the proximal sigmoid colon consistent with early diverticulitis. No perforation or abscess formation is noted. The appendix is within normal limits. The remainder of the colon is unremarkable. Stomach is decompressed. Small bowel is unremarkable. Vascular/Lymphatic: Aortic atherosclerosis. No enlarged abdominal or pelvic lymph nodes. Reproductive: Prostate is unremarkable. Other: No abdominal wall hernia or abnormality. No abdominopelvic ascites. Musculoskeletal: Degenerative changes of lumbar spine are noted. No compression deformity is noted. Review of the MIP images confirms the above findings. IMPRESSION: CTA of the chest: No evidence of pulmonary emboli. Mild dependent atelectatic changes. Prior granulomatous disease. Aortic Atherosclerosis (ICD10-I70.0). CT of the abdomen and pelvis: Mild diverticulitis in the sigmoid without evidence of perforation or abscess formation. Cholelithiasis without complicating factors. Bilateral renal cysts as described. No obstructive changes are noted. Electronically Signed   By: Inez Catalina M.D.   On: 04/14/2020 18:09   US Abdomen  Limited  Result Date: 04/14/2020 CLINICAL DATA:  Cholelithiasis. EXAM: ULTRASOUND ABDOMEN LIMITED RIGHT UPPER QUADRANT COMPARISON:  CT abdomen and pelvis 04/14/2020 FINDINGS: Gallbladder: Gallstones measuring up to 7 mm. No gallbladder wall thickening. No sonographic Murphy sign noted by sonographer. Common bile duct: Diameter: 3 mm Liver: Diffusely increased parenchymal echogenicity without a focal lesion identified. Portal vein is patent on color Doppler imaging with normal direction of blood flow towards the liver. Other: Incidental 2.4 cm complex, partially hyperechoic mass in the upper pole of the right kidney which appears solid with some blood flow on color Doppler imaging. IMPRESSION: 1. Cholelithiasis without evidence of cholecystitis. 2. 2.4 cm right upper pole renal mass highly suspicious for neoplasm. Consider nonemergent abdominal MRI without and with contrast (preferably as an outpatient) for further evaluation. 3. Echogenic liver suggestive of steatosis. Electronically Signed   By: Logan Bores M.D.   On: 04/14/2020 20:33   MR ABDOMEN MRCP W WO CONTAST  Result Date: 04/14/2020 CLINICAL DATA:  Cholelithiasis EXAM: MRI ABDOMEN WITHOUT AND WITH CONTRAST (INCLUDING MRCP) TECHNIQUE: Multiplanar multisequence MR imaging of the abdomen was performed both before and after the administration of intravenous contrast. Heavily T2-weighted images of the biliary and pancreatic ducts were obtained, and three-dimensional MRCP images were rendered by post processing. CONTRAST:  9.33mL GADAVIST GADOBUTROL 1 MMOL/ML IV SOLN COMPARISON:  CT and ultrasound from earlier in the same day. FINDINGS: Lower chest: No focal infiltrate or sizable effusion is noted. Hepatobiliary: Tiny cyst is noted along the anterior margin of the right lobe of the liver measuring less than 1 cm better visualized than on the prior CT examination. Hypointense area is noted in the tip of the right lobe of the liver better visualized than on  prior CT which demonstrates some peripheral enhancement and filling in on delayed post gadolinium images consistent with a small hemangioma. This measures approximate with 1.4 cm in dimension. No other areas of abnormal enhancement are noted within the liver. Gallbladder is well visualized and demonstrates dependent cholelithiasis similar to that seen on prior CT and ultrasound. The biliary tree is within normal limits. No findings to suggest choledocholithiasis are seen. Pancreas:  Pancreas is well visualized and within normal limits. Spleen:  Within normal limits in size and appearance. Adrenals/Urinary Tract: Adrenal glands are within normal limits. Multiple renal cysts are again identified similar to that seen on prior CT examination. Hypointense lesion is noted in the upper pole of right kidney measuring approximately 2.6 cm. Following contrast administration there is irregular enhancement identified consistent with a renal cell neoplasm. No obstructive changes are seen. Bladder is not visualized on these images. Stomach/Bowel: There is some mild inflammatory change surrounding the second portion the duodenum better visualized than on the prior CT examination. This is consistent with duodenitis. No findings to suggest perforation are noted. Small duodenal diverticulum is noted with fluid within adjacent to the ampulla of Vater. The remainder of the visualized small bowel appears within normal limits. Visualized colon is unremarkable. The previously seen inflammatory changes in the sigmoid are not evaluated on this exam. Vascular/Lymphatic: No aneurysmal dilatation is seen. No significant lymphadenopathy is noted. Other:  No free fluid is noted. Musculoskeletal: Visualized bony structures show no acute abnormality. IMPRESSION: Changes consistent with cholelithiasis. No choledocholithiasis is seen. No gallbladder inflammatory changes noted. Thickening and mild inflammatory changes in the second portion the  duodenum consistent with duodenitis. A small duodenal diverticulum is identified with fluid within. No definitive perforation is seen. This may contribute to the patient's elevated LFTs although no biliary ductal dilatation is seen. 2.6 cm enhancing mass lesion in the upper pole of the right kidney as described consistent with renal neoplasm. Small hepatic cyst and small hepatic hemangioma. Electronically Signed   By: Inez Catalina M.D.   On: 04/14/2020 23:17    Anti-infectives: Anti-infectives (From admission, onward)   Start     Dose/Rate Route Frequency Ordered Stop   04/14/20 2200  piperacillin-tazobactam (ZOSYN) IVPB 3.375 g        3.375 g 12.5 mL/hr over 240 Minutes Intravenous Every 8 hours 04/14/20 1937         Assessment/Plan  severe CAD, per cardiology would be extremely high risk for surgery  DM HTN CKD HLD Possible diverticulitis - no LLQ pain , doubt diverticulitis  Choledocholithiasis, resolving cholangitis -MRCP reviewed.  No evidence of acute CBD stone. -D/W Dr. Lyndel Safe of GI.  Suspicion is patient likely passed a stone and did have cholangitis but is resolving.   -patient is very high risk for lap chole and given seems to be improving, would defer surgical intervention at this point -could do ERCP with sphincterotomy, but GI felt that he would be high risk for this as well -cont abx therapy at this point -may try a diet from our standpoint  FEN - carb mod VTE - ASA, heparin 5000 TID ID - zosyn   LOS: 1 day    Henreitta Cea , [facility] 04/15/2020, 8:51 AM Please see Amion for pager number during day hours 7:00am-4:30pm or 7:00am -11:30am on weekends

## 2020-04-15 NOTE — Progress Notes (Signed)
Pt already on CPAP.

## 2020-04-15 NOTE — Progress Notes (Signed)
[facility] PROGRESS NOTE   Ryan Garcia DTO:[id number] DOB: 02/17/1934 DOA: 04/14/2020  PCP: Abner Greenspan, MD  Brief History/Interval Summary: 86 y.o. male with medical history significant for extensive cardiac hx, HTN, DM, HLD, CKD - severe 3v dz previously treated with PCI as he wanted to avoid cabg - recently discharged on 10/12 following left heart cath on 10/12 during admission for unstable angina. He was found to have severe 2v disease without any stents placed and is being treated medically. He was seen in urgent care for some right sided chest/rib pain and referred to [facility] ED. He reports having fever and chills since he was discharged 2 days ago after the heart catheterization.  He reports having some substernal chest pressure that did not radiate and was associated with shortness of breath when he would exert himself.  He also reports right upper quadrant abdominal pain that is worsened with movement and deep inspiration that began the day before he was discharged.  His wife reports he has not looked well and has had a slight pale and yellow appearance since he was discharged home.  Reports the pain in the abdomen does not radiate.  He has not noticed any alleviating factors for the pain.  He states he has not had a good appetite the last few days.  He was found to have elevated liver enzymes and total bilirubin.  CT of the abdomen pelvis showed gallstones and possible mild diverticulitis although he has no left-sided abdominal pain.  Right upper quadrant ultrasound showed no acute cholecystitis and possible left adrenal mass will need further work-up.  He reports he has been having some diarrhea for the last 2 days without blood or mucus in it.   Reason for Visit: Right upper quadrant abdominal pain  Consultants: General surgery.  Cardiology.  Gastroenterology  Procedures: None yet  Antibiotics: Anti-infectives (From admission, onward)   Start     Dose/Rate Route Frequency  Ordered Stop   04/14/20 2200  piperacillin-tazobactam (ZOSYN) IVPB 3.375 g        3.375 g 12.5 mL/hr over 240 Minutes Intravenous Every 8 hours 04/14/20 1937        Subjective/Interval History: Patient denies any pain this morning.  No nausea or vomiting currently.  No chest pain.  No shortness of breath.    Assessment/Plan:  Right upper quadrant abdominal pain with abnormal LFTs Patient noted to have transaminitis along with hyperbilirubinemia.  His alkaline phosphatase was also elevated.  Concern was for cholangitis.  Patient was started on Zosyn.  Patient underwent MRCP which raises concern for duodenitis.  General surgery and gastroenterology were also consulted.  Seems to be stable this morning.  Gastroenterology has recommended antibiotics for 10 days.  They do not plan any procedures.  General surgery also does not plan any procedures.  We will advance his diet today.  If he tolerates we will change his antibiotics to oral.  If he continues to improve we will plan on discharging in the next 24 to 48 hours.  Acute kidney injury Presented with a creatinine of 1.91.  When he was discharged on 10/12 his creatinine was 1.45.  Likely due to hypovolemia.  Patient hydrated.  Improvement in renal function noted.  Monitor urine output.  Patient has been continued on his ACE inhibitor.  Considering improvement renal function we will continue for now.  Cholelithiasis MRI does not suggest cholecystitis or choledocholithiasis.  General surgery is following.  Right renal mass Will need outpatient  referral to urology.  Coronary artery disease Recently seen by cardiology.  Underwent cardiac catheterization on 10/12.  Cardiology has been consulted.  Seems to be stable.  Defer management to them.  Patient currently on aspirin ACE inhibitor and beta-blocker.  Also on Imdur.  Diabetes mellitus type 2, uncontrolled with hyperglycemia HbA1c checked recently was 7.0.  Continue Lantus SSI.  Metformin is  on hold.  Hyperlipidemia Statin is currently on hold due to elevated LFTs.  Essential hypertension Monitor blood pressures closely.  Normocytic anemia/mild thrombocytopenia Monitor counts closely.  No evidence for overt bleeding.  Obesity Estimated body mass index is 34.87 kg/m as calculated from the following:   Height as of 04/11/20: 5\' 11"  (1.803 m).   Weight as of 04/11/20: 113.4 kg.    DVT Prophylaxis: Subcutaneous heparin Code Status: Full code Family Communication: Discussed with the patient.  No family at bedside Disposition Plan: Hopefully return home in improved  Status is: Inpatient  Remains inpatient appropriate because:IV treatments appropriate due to intensity of illness or inability to take PO   Dispo: The patient is from: Home              Anticipated d/c is to: Home              Anticipated d/c date is: 2 days              Patient currently is not medically stable to d/c.      Medications:  Scheduled: . aspirin EC  81 mg Oral Daily  . heparin  5,000 Units Subcutaneous Q8H  . insulin aspart  0-15 Units Subcutaneous TID WC  . insulin aspart  0-5 Units Subcutaneous QHS  . insulin glargine  50 Units Subcutaneous QHS  . isosorbide mononitrate  30 mg Oral Daily  . lisinopril  20 mg Oral Daily  . metoprolol tartrate  25 mg Oral BID  . pantoprazole  40 mg Oral Q0600   Continuous: . lactated ringers 75 mL/hr at 04/14/20 2317  . piperacillin-tazobactam (ZOSYN)  IV 3.375 g (04/15/20 0552)   MWN:[id number] **OR** acetaminophen, nitroGLYCERIN, ondansetron **OR** ondansetron (ZOFRAN) IV   Objective:  Vital Signs  Vitals:   04/15/20 0210 04/15/20 0457 04/15/20 0828 04/15/20 1102  BP:  140/62 (!) 122/58 129/65  Pulse: 85 (!) 53 (!) 50 87  Resp: 18 16 18 18   Temp:  (!) 97.5 F (36.4 C) 97.9 F (36.6 C) 99 F (37.2 C)  TempSrc:  Oral  Oral  SpO2: 96% 96% 97% 98%    Intake/Output Summary (Last 24 hours) at 04/15/2020 1112 Last data filed at  04/15/2020 0845 Gross per 24 hour  Intake --  Output 500 ml  Net -500 ml   There were no vitals filed for this visit.  General appearance: Awake alert.  In no distress Resp: Clear to auscultation bilaterally.  Normal effort Cardio: S1-S2 is normal regular.  No S3-S4.  No rubs murmurs or bruit GI: Abdomen is soft.  Mildly tender in the right upper quadrant without any rebound rigidity or guarding.  No masses organomegaly.  Bowel sounds present. Extremities: No edema.  Full range of motion of lower extremities. Neurologic: Alert and oriented x3.  No focal neurological deficits.    Lab Results:  Data Reviewed: I have personally reviewed following labs and imaging studies  CBC: Recent Labs  Lab 04/11/20 1437 04/12/20 0233 04/14/20 1442 04/14/20 2342 04/15/20 0857  WBC 9.9 7.2 5.5 5.4 5.9  NEUTROABS 6.8  --   --   --  3.6  HGB 12.2* 11.8* 12.8* 12.3* 12.3*  HCT 38.8* 36.3* 40.3 36.8* 37.0*  MCV 96.0 92.6 93.7 90.9 91.1  PLT 165 153 144* 132* 138*    Basic Metabolic Panel: Recent Labs  Lab 04/11/20 1437 04/11/20 1437 04/11/20 1804 04/12/20 0233 04/14/20 1442 04/14/20 2342 04/15/20 0857  NA 140  --   --  137 135 134* 136  K 4.4  --   --  4.4 3.9 3.6 3.6  CL 107  --   --  105 104 102 102  CO2 22  --   --  21* 21* 22 24  GLUCOSE 210*  --   --  212* 351* 194* 184*  BUN 28*  --   --  28* 27* 25* 24*  CREATININE 1.59*   < > 1.51* 1.45* 1.91* 1.62* 1.58*  CALCIUM 8.7*  --   --  8.8* 8.6* 8.4* 8.5*   < > = values in this interval not displayed.    GFR: Estimated Creatinine Clearance: 44.5 mL/min (A) (by C-G formula based on SCr of 1.58 mg/dL (H)).  Liver Function Tests: Recent Labs  Lab 04/11/20 1437 04/14/20 1711 04/14/20 2342 04/15/20 0857  AST 87* 131* 94* 84*  ALT 46* 322* 271* 236*  ALKPHOS 84 214* 198* 194*  BILITOT 1.5* 2.1* 1.8* 1.5*  PROT 6.1* 6.2* 5.9* 5.8*  ALBUMIN 3.3* 3.0* 2.7* 2.6*    Recent Labs  Lab 04/11/20 1437 04/14/20 1711  LIPASE  23 28    CBG: Recent Labs  Lab 04/11/20 2202 04/12/20 0907 04/12/20 1223 04/14/20 2313 04/15/20 0826  GLUCAP 188* 176* 152* 177* 171*     Recent Results (from the past 240 hour(s))  Respiratory Panel by RT PCR (Flu A&B, Covid) - Nasopharyngeal Swab     Status: None   Collection Time: 04/11/20  2:30 PM   Specimen: Nasopharyngeal Swab  Result Value Ref Range Status   SARS Coronavirus 2 by RT PCR NEGATIVE NEGATIVE Final    Comment: (NOTE) SARS-CoV-2 target nucleic acids are NOT DETECTED.  The SARS-CoV-2 RNA is generally detectable in upper respiratoy specimens during the acute phase of infection. The lowest concentration of SARS-CoV-2 viral copies this assay can detect is 131 copies/mL. A negative result does not preclude SARS-Cov-2 infection and should not be used as the sole basis for treatment or other patient management decisions. A negative result may occur with  improper specimen collection/handling, submission of specimen other than nasopharyngeal swab, presence of viral mutation(s) within the areas targeted by this assay, and inadequate number of viral copies (<131 copies/mL). A negative result must be combined with clinical observations, patient history, and epidemiological information. The expected result is Negative.  Fact Sheet for Patients:  PinkCheek.be  Fact Sheet for Healthcare Providers:  GravelBags.it  This test is no t yet approved or cleared by the [place] FDA and  has been authorized for detection and/or diagnosis of SARS-CoV-2 by FDA under an Emergency Use Authorization (EUA). This EUA will remain  in effect (meaning this test can be used) for the duration of the COVID-19 declaration under Section 564(b)(1) of the Act, 21 U.S.C. section 360bbb-3(b)(1), unless the authorization is terminated or revoked sooner.     Influenza A by PCR NEGATIVE NEGATIVE Final   Influenza B by PCR  NEGATIVE NEGATIVE Final    Comment: (NOTE) The Xpert Xpress SARS-CoV-2/FLU/RSV assay is intended as an aid in  the diagnosis of influenza from Nasopharyngeal swab specimens and  should not be used  as a sole basis for treatment. Nasal washings and  aspirates are unacceptable for Xpert Xpress SARS-CoV-2/FLU/RSV  testing.  Fact Sheet for Patients: PinkCheek.be  Fact Sheet for Healthcare Providers: GravelBags.it  This test is not yet approved or cleared by the [place] FDA and  has been authorized for detection and/or diagnosis of SARS-CoV-2 by  FDA under an Emergency Use Authorization (EUA). This EUA will remain  in effect (meaning this test can be used) for the duration of the  Covid-19 declaration under Section 564(b)(1) of the Act, 21  U.S.C. section 360bbb-3(b)(1), unless the authorization is  terminated or revoked. Performed at [facility], [place] [address] [place], [place] [address]   Respiratory Panel by RT PCR (Flu A&B, Covid) - Nasopharyngeal Swab     Status: None   Collection Time: 04/14/20  5:11 PM   Specimen: Nasopharyngeal Swab  Result Value Ref Range Status   SARS Coronavirus 2 by RT PCR NEGATIVE NEGATIVE Final    Comment: (NOTE) SARS-CoV-2 target nucleic acids are NOT DETECTED.  The SARS-CoV-2 RNA is generally detectable in upper respiratoy specimens during the acute phase of infection. The lowest concentration of SARS-CoV-2 viral copies this assay can detect is 131 copies/mL. A negative result does not preclude SARS-Cov-2 infection and should not be used as the sole basis for treatment or other patient management decisions. A negative result may occur with  improper specimen collection/handling, submission of specimen other than nasopharyngeal swab, presence of viral mutation(s) within the areas targeted by this assay, and inadequate number of viral copies (<131 copies/mL). A negative result must  be combined with clinical observations, patient history, and epidemiological information. The expected result is Negative.  Fact Sheet for Patients:  PinkCheek.be  Fact Sheet for Healthcare Providers:  GravelBags.it  This test is no t yet approved or cleared by the [place] FDA and  has been authorized for detection and/or diagnosis of SARS-CoV-2 by FDA under an Emergency Use Authorization (EUA). This EUA will remain  in effect (meaning this test can be used) for the duration of the COVID-19 declaration under Section 564(b)(1) of the Act, 21 U.S.C. section 360bbb-3(b)(1), unless the authorization is terminated or revoked sooner.     Influenza A by PCR NEGATIVE NEGATIVE Final   Influenza B by PCR NEGATIVE NEGATIVE Final    Comment: (NOTE) The Xpert Xpress SARS-CoV-2/FLU/RSV assay is intended as an aid in  the diagnosis of influenza from Nasopharyngeal swab specimens and  should not be used as a sole basis for treatment. Nasal washings and  aspirates are unacceptable for Xpert Xpress SARS-CoV-2/FLU/RSV  testing.  Fact Sheet for Patients: PinkCheek.be  Fact Sheet for Healthcare Providers: GravelBags.it  This test is not yet approved or cleared by the [place] FDA and  has been authorized for detection and/or diagnosis of SARS-CoV-2 by  FDA under an Emergency Use Authorization (EUA). This EUA will remain  in effect (meaning this test can be used) for the duration of the  Covid-19 declaration under Section 564(b)(1) of the Act, 21  U.S.C. section 360bbb-3(b)(1), unless the authorization is  terminated or revoked. Performed at [facility], [place] [address], [place], [place] [address]   Blood culture (routine x 2)     Status: None (Preliminary result)   Collection Time: 04/14/20  5:11 PM   Specimen: BLOOD  Result Value Ref Range Status   Specimen  Description BLOOD BLOOD RIGHT FOREARM  Final   Special Requests   Final  BOTTLES DRAWN AEROBIC AND ANAEROBIC Blood Culture adequate volume   Culture   Final    NO GROWTH < 24 HOURS Performed at [facility], [place] [address] [place], [place] [address]    Report Status PENDING  Incomplete  Blood culture (routine x 2)     Status: None (Preliminary result)   Collection Time: 04/14/20  5:11 PM   Specimen: BLOOD  Result Value Ref Range Status   Specimen Description BLOOD RIGHT ANTECUBITAL  Final   Special Requests   Final    BOTTLES DRAWN AEROBIC AND ANAEROBIC Blood Culture adequate volume   Culture   Final    NO GROWTH < 24 HOURS Performed at [facility], [place] [address] [place], [place] [address]    Report Status PENDING  Incomplete      Radiology Studies: DG Chest 2 View  Result Date: 04/14/2020 CLINICAL DATA:  Chest pain and fever for 1 day EXAM: CHEST - 2 VIEW COMPARISON:  04/11/2020 FINDINGS: Cardiac shadow is mildly enlarged but stable. The lungs are well aerated bilaterally. Mild atelectatic changes are again noted in the left base laterally. No sizable effusion is noted. No bony abnormality is seen. IMPRESSION: Stable left basilar atelectasis. Electronically Signed   By: Inez Catalina M.D.   On: 04/14/2020 15:27   CT Angio Chest PE W and/or Wo Contrast  Result Date: 04/14/2020 CLINICAL DATA:  Shortness of breath with fevers and abdominal pain EXAM: CT ANGIOGRAPHY CHEST CT ABDOMEN AND PELVIS WITH CONTRAST TECHNIQUE: Multidetector CT imaging of the chest was performed using the standard protocol during bolus administration of intravenous contrast. Multiplanar CT image reconstructions and MIPs were obtained to evaluate the vascular anatomy. Multidetector CT imaging of the abdomen and pelvis was performed using the standard protocol during bolus administration of intravenous contrast. CONTRAST:  70mL OMNIPAQUE IOHEXOL 350 MG/ML SOLN COMPARISON:  None. FINDINGS: CTA CHEST  FINDINGS Cardiovascular: Thoracic aorta demonstrates mild atherosclerotic calcifications. No aneurysmal dilatation is seen. No significant opacification is noted. Coronary calcifications are seen. No cardiac enlargement is noted. The pulmonary artery is well visualized with a normal branching pattern bilaterally. No filling defect to suggest pulmonary embolism is noted. Mediastinum/Nodes: Thoracic inlet is within normal limits. No sizable hilar or mediastinal adenopathy is noted. A few small calcified nodes are seen consistent with prior granulomatous disease. The esophagus is within normal limits. Lungs/Pleura: Mild dependent atelectatic changes are noted bilaterally. Some mosaic attenuation in the lungs is noted consistent with air trapping. No focal confluent infiltrate or sizable effusion is seen. No parenchymal nodules are noted. Calcified granuloma is noted in the right lower lobe. Musculoskeletal: Degenerative changes of the thoracic spine are noted. No acute rib abnormality is noted. Review of the MIP images confirms the above findings. CT ABDOMEN and PELVIS FINDINGS Hepatobiliary: Gallbladder is partially distended with a few small dependent gallstones. No pericholecystic fluid is noted. The liver shows fatty infiltration. Pancreas: Unremarkable. No pancreatic ductal dilatation or surrounding inflammatory changes. Spleen: Normal in size without focal abnormality. Adrenals/Urinary Tract: Adrenal glands are unremarkable bilaterally. Kidneys are well visualized bilaterally with large bilateral renal cysts. The largest of these on the right measures 8.5 cm. The largest of these on the left measures 13 cm in greatest dimension. Normal enhancement and excretion is seen. Bladder is partially distended. Ureters are within normal limits. No renal calculi are seen. Stomach/Bowel: Scattered diverticular change of the colon is noted. Very mild pericolonic inflammatory changes noted in the proximal sigmoid colon  consistent with early diverticulitis. No perforation or abscess formation is noted. The appendix is within normal limits. The remainder of the colon is unremarkable. Stomach is decompressed. Small bowel is unremarkable. Vascular/Lymphatic: Aortic atherosclerosis. No enlarged abdominal or pelvic lymph nodes. Reproductive: Prostate is unremarkable. Other: No abdominal wall hernia or abnormality. No abdominopelvic ascites. Musculoskeletal: Degenerative changes of lumbar spine are noted. No compression deformity is noted. Review of the MIP images confirms the above findings. IMPRESSION: CTA of the chest: No evidence of pulmonary emboli. Mild dependent atelectatic changes. Prior granulomatous disease. Aortic Atherosclerosis (ICD10-I70.0). CT of the abdomen and pelvis: Mild diverticulitis in the sigmoid without evidence of perforation or abscess formation. Cholelithiasis without complicating factors. Bilateral renal cysts as described. No obstructive changes are noted. Electronically Signed   By: Inez Catalina M.D.   On: 04/14/2020 18:09   CT ABDOMEN PELVIS W CONTRAST  Result Date: 04/14/2020 CLINICAL DATA:  Shortness of breath with fevers and abdominal pain EXAM: CT ANGIOGRAPHY CHEST CT ABDOMEN AND PELVIS WITH CONTRAST TECHNIQUE: Multidetector CT imaging of the chest was performed using the standard protocol during bolus administration of intravenous contrast. Multiplanar CT image reconstructions and MIPs were obtained to evaluate the vascular anatomy. Multidetector CT imaging of the abdomen and pelvis was performed using the standard protocol during bolus administration of intravenous contrast. CONTRAST:  4mL OMNIPAQUE IOHEXOL 350 MG/ML SOLN COMPARISON:  None. FINDINGS: CTA CHEST FINDINGS Cardiovascular: Thoracic aorta demonstrates mild atherosclerotic calcifications. No aneurysmal dilatation is seen. No significant opacification is noted. Coronary calcifications are seen. No cardiac enlargement is noted. The  pulmonary artery is well visualized with a normal branching pattern bilaterally. No filling defect to suggest pulmonary embolism is noted. Mediastinum/Nodes: Thoracic inlet is within normal limits. No sizable hilar or mediastinal adenopathy is noted. A few small calcified nodes are seen consistent with prior granulomatous disease. The esophagus is within normal limits. Lungs/Pleura: Mild dependent atelectatic changes are noted bilaterally. Some mosaic attenuation in the lungs is noted consistent with air trapping. No focal confluent infiltrate or sizable effusion is seen. No parenchymal nodules are noted. Calcified granuloma is noted in the right lower lobe. Musculoskeletal: Degenerative changes of the thoracic spine are noted. No acute rib abnormality is noted. Review of the MIP images confirms the above findings. CT ABDOMEN and PELVIS FINDINGS Hepatobiliary: Gallbladder is partially distended with a few small dependent gallstones. No pericholecystic fluid is noted. The liver shows fatty infiltration. Pancreas: Unremarkable. No pancreatic ductal dilatation or surrounding inflammatory changes. Spleen: Normal in size without focal abnormality. Adrenals/Urinary Tract: Adrenal glands are unremarkable bilaterally. Kidneys are well visualized bilaterally with large bilateral renal cysts. The largest of these on the right measures 8.5 cm. The largest of these on the left measures 13 cm in greatest dimension. Normal enhancement and excretion is seen. Bladder is partially distended. Ureters are within normal limits. No renal calculi are seen. Stomach/Bowel: Scattered diverticular change of the colon is noted. Very mild pericolonic inflammatory changes noted in the proximal sigmoid colon consistent with early diverticulitis. No perforation or abscess formation is noted. The appendix is within normal limits. The remainder of the colon is unremarkable. Stomach is decompressed. Small bowel is unremarkable. Vascular/Lymphatic:  Aortic atherosclerosis. No enlarged abdominal or pelvic lymph nodes. Reproductive: Prostate is unremarkable. Other: No abdominal wall hernia or abnormality. No abdominopelvic ascites. Musculoskeletal: Degenerative changes of lumbar spine are noted. No compression deformity is noted. Review of the MIP images confirms the above findings. IMPRESSION: CTA of the chest: No evidence of pulmonary  emboli. Mild dependent atelectatic changes. Prior granulomatous disease. Aortic Atherosclerosis (ICD10-I70.0). CT of the abdomen and pelvis: Mild diverticulitis in the sigmoid without evidence of perforation or abscess formation. Cholelithiasis without complicating factors. Bilateral renal cysts as described. No obstructive changes are noted. Electronically Signed   By: Inez Catalina M.D.   On: 04/14/2020 18:09   US Abdomen Limited  Result Date: 04/14/2020 CLINICAL DATA:  Cholelithiasis. EXAM: ULTRASOUND ABDOMEN LIMITED RIGHT UPPER QUADRANT COMPARISON:  CT abdomen and pelvis 04/14/2020 FINDINGS: Gallbladder: Gallstones measuring up to 7 mm. No gallbladder wall thickening. No sonographic Murphy sign noted by sonographer. Common bile duct: Diameter: 3 mm Liver: Diffusely increased parenchymal echogenicity without a focal lesion identified. Portal vein is patent on color Doppler imaging with normal direction of blood flow towards the liver. Other: Incidental 2.4 cm complex, partially hyperechoic mass in the upper pole of the right kidney which appears solid with some blood flow on color Doppler imaging. IMPRESSION: 1. Cholelithiasis without evidence of cholecystitis. 2. 2.4 cm right upper pole renal mass highly suspicious for neoplasm. Consider nonemergent abdominal MRI without and with contrast (preferably as an outpatient) for further evaluation. 3. Echogenic liver suggestive of steatosis. Electronically Signed   By: Logan Bores M.D.   On: 04/14/2020 20:33   MR ABDOMEN MRCP W WO CONTAST  Result Date: 04/14/2020 CLINICAL  DATA:  Cholelithiasis EXAM: MRI ABDOMEN WITHOUT AND WITH CONTRAST (INCLUDING MRCP) TECHNIQUE: Multiplanar multisequence MR imaging of the abdomen was performed both before and after the administration of intravenous contrast. Heavily T2-weighted images of the biliary and pancreatic ducts were obtained, and three-dimensional MRCP images were rendered by post processing. CONTRAST:  9.95mL GADAVIST GADOBUTROL 1 MMOL/ML IV SOLN COMPARISON:  CT and ultrasound from earlier in the same day. FINDINGS: Lower chest: No focal infiltrate or sizable effusion is noted. Hepatobiliary: Tiny cyst is noted along the anterior margin of the right lobe of the liver measuring less than 1 cm better visualized than on the prior CT examination. Hypointense area is noted in the tip of the right lobe of the liver better visualized than on prior CT which demonstrates some peripheral enhancement and filling in on delayed post gadolinium images consistent with a small hemangioma. This measures approximate with 1.4 cm in dimension. No other areas of abnormal enhancement are noted within the liver. Gallbladder is well visualized and demonstrates dependent cholelithiasis similar to that seen on prior CT and ultrasound. The biliary tree is within normal limits. No findings to suggest choledocholithiasis are seen. Pancreas:  Pancreas is well visualized and within normal limits. Spleen:  Within normal limits in size and appearance. Adrenals/Urinary Tract: Adrenal glands are within normal limits. Multiple renal cysts are again identified similar to that seen on prior CT examination. Hypointense lesion is noted in the upper pole of right kidney measuring approximately 2.6 cm. Following contrast administration there is irregular enhancement identified consistent with a renal cell neoplasm. No obstructive changes are seen. Bladder is not visualized on these images. Stomach/Bowel: There is some mild inflammatory change surrounding the second portion the  duodenum better visualized than on the prior CT examination. This is consistent with duodenitis. No findings to suggest perforation are noted. Small duodenal diverticulum is noted with fluid within adjacent to the ampulla of Vater. The remainder of the visualized small bowel appears within normal limits. Visualized colon is unremarkable. The previously seen inflammatory changes in the sigmoid are not evaluated on this exam. Vascular/Lymphatic: No aneurysmal dilatation is seen. No significant lymphadenopathy is noted.  Other:  No free fluid is noted. Musculoskeletal: Visualized bony structures show no acute abnormality. IMPRESSION: Changes consistent with cholelithiasis. No choledocholithiasis is seen. No gallbladder inflammatory changes noted. Thickening and mild inflammatory changes in the second portion the duodenum consistent with duodenitis. A small duodenal diverticulum is identified with fluid within. No definitive perforation is seen. This may contribute to the patient's elevated LFTs although no biliary ductal dilatation is seen. 2.6 cm enhancing mass lesion in the upper pole of the right kidney as described consistent with renal neoplasm. Small hepatic cyst and small hepatic hemangioma. Electronically Signed   By: Inez Catalina M.D.   On: 04/14/2020 23:17       LOS: 1 day   [facility] Pager on [email or web address]  04/15/2020, 11:12 AM

## 2020-04-16 ENCOUNTER — Other Ambulatory Visit: Payer: Self-pay

## 2020-04-16 DIAGNOSIS — I25119 Atherosclerotic heart disease of native coronary artery with unspecified angina pectoris: Secondary | ICD-10-CM | POA: Diagnosis not present

## 2020-04-16 DIAGNOSIS — K8309 Other cholangitis: Secondary | ICD-10-CM | POA: Diagnosis not present

## 2020-04-16 DIAGNOSIS — I10 Essential (primary) hypertension: Secondary | ICD-10-CM | POA: Diagnosis not present

## 2020-04-16 DIAGNOSIS — N179 Acute kidney failure, unspecified: Secondary | ICD-10-CM | POA: Diagnosis not present

## 2020-04-16 LAB — COMPREHENSIVE METABOLIC PANEL
ALT: 192 U/L — ABNORMAL HIGH (ref 0–44)
AST: 67 U/L — ABNORMAL HIGH (ref 15–41)
Albumin: 2.5 g/dL — ABNORMAL LOW (ref 3.5–5.0)
Alkaline Phosphatase: 197 U/L — ABNORMAL HIGH (ref 38–126)
Anion gap: 9 (ref 5–15)
BUN: 27 mg/dL — ABNORMAL HIGH (ref 8–23)
CO2: 25 mmol/L (ref 22–32)
Calcium: 8.3 mg/dL — ABNORMAL LOW (ref 8.9–10.3)
Chloride: 104 mmol/L (ref 98–111)
Creatinine, Ser: 1.75 mg/dL — ABNORMAL HIGH (ref 0.61–1.24)
GFR, Estimated: 35 mL/min — ABNORMAL LOW (ref >60)
Glucose, Bld: 211 mg/dL — ABNORMAL HIGH (ref 70–99)
Potassium: 3.8 mmol/L (ref 3.5–5.1)
Sodium: 138 mmol/L (ref 135–145)
Total Bilirubin: 1.3 mg/dL — ABNORMAL HIGH (ref 0.3–1.2)
Total Protein: 5.4 g/dL — ABNORMAL LOW (ref 6.5–8.1)

## 2020-04-16 LAB — CBC
HCT: 34.9 % — ABNORMAL LOW (ref 39.0–52.0)
Hemoglobin: 11.6 g/dL — ABNORMAL LOW (ref 13.0–17.0)
MCH: 31 pg (ref 26.0–34.0)
MCHC: 33.2 g/dL (ref 30.0–36.0)
MCV: 93.3 fL (ref 80.0–100.0)
Platelets: 147 10*3/uL — ABNORMAL LOW (ref 150–400)
RBC: 3.74 MIL/uL — ABNORMAL LOW (ref 4.22–5.81)
RDW: 15.2 % (ref 11.5–15.5)
WBC: 6.7 10*3/uL (ref 4.0–10.5)
nRBC: 0 % (ref 0.0–0.2)

## 2020-04-16 LAB — URINE CULTURE: Culture: <10000 — AB

## 2020-04-16 LAB — GLUCOSE, CAPILLARY
Glucose-Capillary: 123 mg/dL — ABNORMAL HIGH (ref 70–99)
Glucose-Capillary: 251 mg/dL — ABNORMAL HIGH (ref 70–99)
Glucose-Capillary: 232 mg/dL — ABNORMAL HIGH (ref 70–99)
Glucose-Capillary: 242 mg/dL — ABNORMAL HIGH (ref 70–99)

## 2020-04-16 LAB — LIPASE, BLOOD: Lipase: 28 U/L (ref 11–51)

## 2020-04-16 MED ORDER — SODIUM CHLORIDE 0.45 % IV BOLUS
500.0000 mL | Freq: Once | INTRAVENOUS | Status: AC
  Administered 2020-04-16: 500 mL via INTRAVENOUS

## 2020-04-16 MED ORDER — AMOXICILLIN-POT CLAVULANATE 875-125 MG PO TABS
1.0000 | ORAL_TABLET | Freq: Two times a day (BID) | ORAL | Status: DC
  Administered 2020-04-16 – 2020-04-17 (×3): 1 via ORAL
  Filled 2020-04-16 (×3): qty 1

## 2020-04-16 NOTE — Progress Notes (Signed)
Patient ID: Ryan Garcia, male   DOB: [birth year], 86 y.o.   MRN: [id number]   [facility] Surgery Progress Note:   * No surgery found *  Subjective: Mental status is clear.  Complaints about everything.  He is in a bad mood this morning.  Wife was upset-I spent some time trying to help him put things in perspective.  . Objective: Vital signs in last 24 hours: Temp:  [98.2 F (36.8 C)-99 F (37.2 C)] 98.5 F (36.9 C) (10/16 0446) Pulse Rate:  [48-87] 59 (10/16 0446) Resp:  [16-18] 18 (10/16 0446) BP: (129-136)/(62-79) 131/62 (10/16 0446) SpO2:  [96 %-100 %] 97 % (10/16 0446)  Intake/Output from previous day: 10/15 0701 - 10/16 0700 In: 1490.1 [P.O.:550; I.V.:875; IV Piggyback:65.2] Out: 2025 [Urine:2025] Intake/Output this shift: No intake/output data recorded.  Physical Exam: Work of breathing is not labored.  Sitting up on the bedside  Lab Results:  Results for orders placed or performed during the hospital encounter of 04/14/20 (from the past 48 hour(s))  Basic metabolic panel     Status: Abnormal   Collection Time: 04/14/20  2:42 PM  Result Value Ref Range   Sodium 135 135 - 145 mmol/L   Potassium 3.9 3.5 - 5.1 mmol/L   Chloride 104 98 - 111 mmol/L   CO2 21 (L) 22 - 32 mmol/L   Glucose, Bld 351 (H) 70 - 99 mg/dL    Comment: Glucose reference range applies only to samples taken after fasting for at least 8 hours.   BUN 27 (H) 8 - 23 mg/dL   Creatinine, Ser 1.91 (H) 0.61 - 1.24 mg/dL   Calcium 8.6 (L) 8.9 - 10.3 mg/dL   GFR, Estimated 31 (L) >60 mL/min   Anion gap 10 5 - 15    Comment: Performed at [facility] [address], [place], [place] [address]  CBC     Status: Abnormal   Collection Time: 04/14/20  2:42 PM  Result Value Ref Range   WBC 5.5 4.0 - 10.5 K/uL   RBC 4.30 4.22 - 5.81 MIL/uL   Hemoglobin 12.8 (L) 13.0 - 17.0 g/dL   HCT 40.3 39 - 52 %   MCV 93.7 80.0 - 100.0 fL   MCH 29.8 26.0 - 34.0 pg   MCHC 31.8 30.0 - 36.0 g/dL   RDW 15.2 11.5 -  15.5 %   Platelets 144 (L) 150 - 400 K/uL   nRBC 0.0 0.0 - 0.2 %    Comment: Performed at [facility], [place] [address] [place], [place] [address]  Troponin I (High Sensitivity)     Status: None   Collection Time: 04/14/20  2:42 PM  Result Value Ref Range   Troponin I (High Sensitivity) 14 <18 ng/L    Comment: (NOTE) Elevated high sensitivity troponin I (hsTnI) values and significant  changes across serial measurements may suggest ACS but many other  chronic and acute conditions are known to elevate hsTnI results.  Refer to the "Links" section for chest pain algorithms and additional  guidance. Performed at [facility], [place] [address] [place], [place] [address]   Hepatic function panel     Status: Abnormal   Collection Time: 04/14/20  5:11 PM  Result Value Ref Range   Total Protein 6.2 (L) 6.5 - 8.1 g/dL   Albumin 3.0 (L) 3.5 - 5.0 g/dL   AST 131 (H) 15 - 41 U/L   ALT 322 (H) 0 - 44  U/L   Alkaline Phosphatase 214 (H) 38 - 126 U/L   Total Bilirubin 2.1 (H) 0.3 - 1.2 mg/dL   Bilirubin, Direct 1.0 (H) 0.0 - 0.2 mg/dL   Indirect Bilirubin 1.1 (H) 0.3 - 0.9 mg/dL    Comment: Performed at [facility] [address], [place], [place] [address]  Respiratory Panel by RT PCR (Flu A&B, Covid) - Nasopharyngeal Swab     Status: None   Collection Time: 04/14/20  5:11 PM   Specimen: Nasopharyngeal Swab  Result Value Ref Range   SARS Coronavirus 2 by RT PCR NEGATIVE NEGATIVE    Comment: (NOTE) SARS-CoV-2 target nucleic acids are NOT DETECTED.  The SARS-CoV-2 RNA is generally detectable in upper respiratoy specimens during the acute phase of infection. The lowest concentration of SARS-CoV-2 viral copies this assay can detect is 131 copies/mL. A negative result does not preclude SARS-Cov-2 infection and should not be used as the sole basis for treatment or other patient management decisions. A negative result may occur with  improper specimen collection/handling,  submission of specimen other than nasopharyngeal swab, presence of viral mutation(s) within the areas targeted by this assay, and inadequate number of viral copies (<131 copies/mL). A negative result must be combined with clinical observations, patient history, and epidemiological information. The expected result is Negative.  Fact Sheet for Patients:  PinkCheek.be  Fact Sheet for Healthcare Providers:  GravelBags.it  This test is no t yet approved or cleared by the [place] FDA and  has been authorized for detection and/or diagnosis of SARS-CoV-2 by FDA under an Emergency Use Authorization (EUA). This EUA will remain  in effect (meaning this test can be used) for the duration of the COVID-19 declaration under Section 564(b)(1) of the Act, 21 U.S.C. section 360bbb-3(b)(1), unless the authorization is terminated or revoked sooner.     Influenza A by PCR NEGATIVE NEGATIVE   Influenza B by PCR NEGATIVE NEGATIVE    Comment: (NOTE) The Xpert Xpress SARS-CoV-2/FLU/RSV assay is intended as an aid in  the diagnosis of influenza from Nasopharyngeal swab specimens and  should not be used as a sole basis for treatment. Nasal washings and  aspirates are unacceptable for Xpert Xpress SARS-CoV-2/FLU/RSV  testing.  Fact Sheet for Patients: PinkCheek.be  Fact Sheet for Healthcare Providers: GravelBags.it  This test is not yet approved or cleared by the [place] FDA and  has been authorized for detection and/or diagnosis of SARS-CoV-2 by  FDA under an Emergency Use Authorization (EUA). This EUA will remain  in effect (meaning this test can be used) for the duration of the  Covid-19 declaration under Section 564(b)(1) of the Act, 21  U.S.C. section 360bbb-3(b)(1), unless the authorization is  terminated or revoked. Performed at [facility], [place] [address],  [place], [place] [address]   Blood culture (routine x 2)     Status: None (Preliminary result)   Collection Time: 04/14/20  5:11 PM   Specimen: BLOOD  Result Value Ref Range   Specimen Description BLOOD BLOOD RIGHT FOREARM    Special Requests      BOTTLES DRAWN AEROBIC AND ANAEROBIC Blood Culture adequate volume   Culture      NO GROWTH 2 DAYS Performed at [facility], [place] [address] [place], [place] [address]    Report Status PENDING   Blood culture (routine x 2)     Status: None (Preliminary result)   Collection Time: 04/14/20  5:11 PM   Specimen: BLOOD  Result Value Ref Range   Specimen Description BLOOD RIGHT ANTECUBITAL    Special Requests      BOTTLES DRAWN AEROBIC AND ANAEROBIC Blood Culture adequate volume   Culture      NO GROWTH 2 DAYS Performed at [facility], [place] [address] [place], [place] [address]    Report Status PENDING   Lactic acid, plasma     Status: None   Collection Time: 04/14/20  5:11 PM  Result Value Ref Range   Lactic Acid, Venous 1.3 0.5 - 1.9 mmol/L    Comment: Performed at [facility] [address] [place], [place] [address]  Troponin I (High Sensitivity)     Status: None   Collection Time: 04/14/20  5:11 PM  Result Value Ref Range   Troponin I (High Sensitivity) 13 <18 ng/L    Comment: (NOTE) Elevated high sensitivity troponin I (hsTnI) values and significant  changes across serial measurements may suggest ACS but many other  chronic and acute conditions are known to elevate hsTnI results.  Refer to the "Links" section for chest pain algorithms and additional  guidance. Performed at [facility], [place] [address] [place], [place] [address]   Lipase, blood     Status: None   Collection Time: 04/14/20  5:11 PM  Result Value Ref Range   Lipase 28 11 - 51 U/L    Comment: Performed at [facility], [place] [address], [place], [place] [address]  Urinalysis, Routine w reflex microscopic Urine, Clean Catch     Status:  Abnormal   Collection Time: 04/14/20  7:50 PM  Result Value Ref Range   Color, Urine YELLOW YELLOW   APPearance HAZY (A) CLEAR   Specific Gravity, Urine 1.021 1.005 - 1.030   pH 5.0 5.0 - 8.0   Glucose, UA 150 (A) NEGATIVE mg/dL   Hgb urine dipstick SMALL (A) NEGATIVE   Bilirubin Urine NEGATIVE NEGATIVE   Ketones, ur NEGATIVE NEGATIVE mg/dL   Protein, ur NEGATIVE NEGATIVE mg/dL   Nitrite NEGATIVE NEGATIVE   Leukocytes,Ua NEGATIVE NEGATIVE   RBC / HPF 0-5 0 - 5 RBC/hpf   WBC, UA 0-5 0 - 5 WBC/hpf   Bacteria, UA RARE (A) NONE SEEN   Squamous Epithelial / LPF 0-5 0 - 5    Comment: Performed at [facility], [address] [place], [place] [address]  Urine culture     Status: Abnormal   Collection Time: 04/14/20  8:22 PM   Specimen: Urine, Random  Result Value Ref Range   Specimen Description URINE, RANDOM    Special Requests NONE    Culture (A)     <10,000 COLONIES/mL INSIGNIFICANT GROWTH Performed at [facility], [place] [address], [place], [place] [address]    Report Status 04/16/2020 FINAL   Glucose, capillary     Status: Abnormal   Collection Time: 04/14/20 11:13 PM  Result Value Ref Range   Glucose-Capillary 177 (H) 70 - 99 mg/dL    Comment: Glucose reference range applies only to samples taken after fasting for at least 8 hours.  Lactic acid, plasma     Status: None   Collection Time: 04/14/20 11:42 PM  Result Value Ref Range   Lactic Acid, Venous 1.0 0.5 - 1.9 mmol/L    Comment: Performed at [facility] [address] [place],  [address]  CBC     Status: Abnormal   Collection Time: 04/14/20 11:42 PM  Result Value Ref Range   WBC 5.4 4.0 - 10.5 K/uL   RBC 4.05 (L) 4.22 - 5.81 MIL/uL   Hemoglobin 12.3 (L) 13.0 - 17.0 g/dL   HCT 36.8 (L) 39 - 52 %   MCV 90.9 80.0 - 100.0 fL   MCH 30.4 26.0 - 34.0 pg   MCHC 33.4 30.0 - 36.0 g/dL   RDW 15.2 11.5 - 15.5 %   Platelets 132 (L) 150 - 400 K/uL    Comment: REPEATED TO VERIFY   nRBC 0.0 0.0 -  0.2 %    Comment: Performed at [facility], [place] [address], [place], [place] [address]  Comprehensive metabolic panel     Status: Abnormal   Collection Time: 04/14/20 11:42 PM  Result Value Ref Range   Sodium 134 (L) 135 - 145 mmol/L   Potassium 3.6 3.5 - 5.1 mmol/L   Chloride 102 98 - 111 mmol/L   CO2 22 22 - 32 mmol/L   Glucose, Bld 194 (H) 70 - 99 mg/dL    Comment: Glucose reference range applies only to samples taken after fasting for at least 8 hours.   BUN 25 (H) 8 - 23 mg/dL   Creatinine, Ser 1.62 (H) 0.61 - 1.24 mg/dL   Calcium 8.4 (L) 8.9 - 10.3 mg/dL   Total Protein 5.9 (L) 6.5 - 8.1 g/dL   Albumin 2.7 (L) 3.5 - 5.0 g/dL   AST 94 (H) 15 - 41 U/L   ALT 271 (H) 0 - 44 U/L   Alkaline Phosphatase 198 (H) 38 - 126 U/L   Total Bilirubin 1.8 (H) 0.3 - 1.2 mg/dL   GFR, Estimated 38 (L) >60 mL/min   Anion gap 10 5 - 15    Comment: Performed at [facility] [address] [place], [place] [address]  Glucose, capillary     Status: Abnormal   Collection Time: 04/15/20  8:26 AM  Result Value Ref Range   Glucose-Capillary 171 (H) 70 - 99 mg/dL    Comment: Glucose reference range applies only to samples taken after fasting for at least 8 hours.  CBC with Differential/Platelet     Status: Abnormal   Collection Time: 04/15/20  8:57 AM  Result Value Ref Range   WBC 5.9 4.0 - 10.5 K/uL   RBC 4.06 (L) 4.22 - 5.81 MIL/uL   Hemoglobin 12.3 (L) 13.0 - 17.0 g/dL   HCT 37.0 (L) 39 - 52 %   MCV 91.1 80.0 - 100.0 fL   MCH 30.3 26.0 - 34.0 pg   MCHC 33.2 30.0 - 36.0 g/dL   RDW 15.0 11.5 - 15.5 %   Platelets 138 (L) 150 - 400 K/uL    Comment: REPEATED TO VERIFY CONSISTENT WITH PREVIOUS RESULT    nRBC 0.0 0.0 - 0.2 %   Neutrophils Relative % 60 %   Neutro Abs 3.6 1.7 - 7.7 K/uL   Lymphocytes Relative 19 %   Lymphs Abs 1.1 0.7 - 4.0 K/uL   Monocytes Relative 16 %   Monocytes Absolute 0.9 0.1 - 1.0 K/uL   Eosinophils Relative 4 %   Eosinophils Absolute 0.2 0.0 - 0.5 K/uL    Basophils Relative 1 %   Basophils Absolute 0.0 0.0 - 0.1 K/uL   Immature Granulocytes 0 %   Abs Immature Granulocytes 0.02 0.00 - 0.07 K/uL    Comment: Performed at [facility], [address] [place], [place] [address]  Comprehensive metabolic panel  Status: Abnormal   Collection Time: 04/15/20  8:57 AM  Result Value Ref Range   Sodium 136 135 - 145 mmol/L   Potassium 3.6 3.5 - 5.1 mmol/L   Chloride 102 98 - 111 mmol/L   CO2 24 22 - 32 mmol/L   Glucose, Bld 184 (H) 70 - 99 mg/dL    Comment: Glucose reference range applies only to samples taken after fasting for at least 8 hours.   BUN 24 (H) 8 - 23 mg/dL   Creatinine, Ser 1.58 (H) 0.61 - 1.24 mg/dL   Calcium 8.5 (L) 8.9 - 10.3 mg/dL   Total Protein 5.8 (L) 6.5 - 8.1 g/dL   Albumin 2.6 (L) 3.5 - 5.0 g/dL   AST 84 (H) 15 - 41 U/L   ALT 236 (H) 0 - 44 U/L   Alkaline Phosphatase 194 (H) 38 - 126 U/L   Total Bilirubin 1.5 (H) 0.3 - 1.2 mg/dL   GFR, Estimated 40 (L) >60 mL/min   Anion gap 10 5 - 15    Comment: Performed at [facility] [address], [place], [place] [address]  Glucose, capillary     Status: Abnormal   Collection Time: 04/15/20 12:06 PM  Result Value Ref Range   Glucose-Capillary 157 (H) 70 - 99 mg/dL    Comment: Glucose reference range applies only to samples taken after fasting for at least 8 hours.  Glucose, capillary     Status: Abnormal   Collection Time: 04/15/20  5:00 PM  Result Value Ref Range   Glucose-Capillary 259 (H) 70 - 99 mg/dL    Comment: Glucose reference range applies only to samples taken after fasting for at least 8 hours.  Glucose, capillary     Status: Abnormal   Collection Time: 04/15/20  9:20 PM  Result Value Ref Range   Glucose-Capillary 296 (H) 70 - 99 mg/dL    Comment: Glucose reference range applies only to samples taken after fasting for at least 8 hours.  Lipase, blood     Status: None   Collection Time: 04/16/20  1:11 AM  Result Value Ref Range   Lipase 28  11 - 51 U/L    Comment: Performed at [facility] [address] [place], [place] [address]  Comprehensive metabolic panel     Status: Abnormal   Collection Time: 04/16/20  1:11 AM  Result Value Ref Range   Sodium 138 135 - 145 mmol/L   Potassium 3.8 3.5 - 5.1 mmol/L   Chloride 104 98 - 111 mmol/L   CO2 25 22 - 32 mmol/L   Glucose, Bld 211 (H) 70 - 99 mg/dL    Comment: Glucose reference range applies only to samples taken after fasting for at least 8 hours.   BUN 27 (H) 8 - 23 mg/dL   Creatinine, Ser 1.75 (H) 0.61 - 1.24 mg/dL   Calcium 8.3 (L) 8.9 - 10.3 mg/dL   Total Protein 5.4 (L) 6.5 - 8.1 g/dL   Albumin 2.5 (L) 3.5 - 5.0 g/dL   AST 67 (H) 15 - 41 U/L   ALT 192 (H) 0 - 44 U/L   Alkaline Phosphatase 197 (H) 38 - 126 U/L   Total Bilirubin 1.3 (H) 0.3 - 1.2 mg/dL   GFR, Estimated 35 (L) >60 mL/min   Anion gap 9 5 - 15    Comment: Performed at [facility] [address], [place], [place] [address]  CBC     Status:  Abnormal   Collection Time: 04/16/20  1:11 AM  Result Value Ref Range   WBC 6.7 4.0 - 10.5 K/uL   RBC 3.74 (L) 4.22 - 5.81 MIL/uL   Hemoglobin 11.6 (L) 13.0 - 17.0 g/dL   HCT 34.9 (L) 39 - 52 %   MCV 93.3 80.0 - 100.0 fL   MCH 31.0 26.0 - 34.0 pg   MCHC 33.2 30.0 - 36.0 g/dL   RDW 15.2 11.5 - 15.5 %   Platelets 147 (L) 150 - 400 K/uL   nRBC 0.0 0.0 - 0.2 %    Comment: Performed at [facility] [address] [place], [place] [address]  Glucose, capillary     Status: Abnormal   Collection Time: 04/16/20  8:04 AM  Result Value Ref Range   Glucose-Capillary 123 (H) 70 - 99 mg/dL    Comment: Glucose reference range applies only to samples taken after fasting for at least 8 hours.    Radiology/Results: DG Chest 2 View  Result Date: 04/14/2020 CLINICAL DATA:  Chest pain and fever for 1 day EXAM: CHEST - 2 VIEW COMPARISON:  04/11/2020 FINDINGS: Cardiac shadow is mildly enlarged but stable. The lungs are well aerated bilaterally. Mild  atelectatic changes are again noted in the left base laterally. No sizable effusion is noted. No bony abnormality is seen. IMPRESSION: Stable left basilar atelectasis. Electronically Signed   By: Inez Catalina M.D.   On: 04/14/2020 15:27   CT Angio Chest PE W and/or Wo Contrast  Result Date: 04/14/2020 CLINICAL DATA:  Shortness of breath with fevers and abdominal pain EXAM: CT ANGIOGRAPHY CHEST CT ABDOMEN AND PELVIS WITH CONTRAST TECHNIQUE: Multidetector CT imaging of the chest was performed using the standard protocol during bolus administration of intravenous contrast. Multiplanar CT image reconstructions and MIPs were obtained to evaluate the vascular anatomy. Multidetector CT imaging of the abdomen and pelvis was performed using the standard protocol during bolus administration of intravenous contrast. CONTRAST:  53mL OMNIPAQUE IOHEXOL 350 MG/ML SOLN COMPARISON:  None. FINDINGS: CTA CHEST FINDINGS Cardiovascular: Thoracic aorta demonstrates mild atherosclerotic calcifications. No aneurysmal dilatation is seen. No significant opacification is noted. Coronary calcifications are seen. No cardiac enlargement is noted. The pulmonary artery is well visualized with a normal branching pattern bilaterally. No filling defect to suggest pulmonary embolism is noted. Mediastinum/Nodes: Thoracic inlet is within normal limits. No sizable hilar or mediastinal adenopathy is noted. A few small calcified nodes are seen consistent with prior granulomatous disease. The esophagus is within normal limits. Lungs/Pleura: Mild dependent atelectatic changes are noted bilaterally. Some mosaic attenuation in the lungs is noted consistent with air trapping. No focal confluent infiltrate or sizable effusion is seen. No parenchymal nodules are noted. Calcified granuloma is noted in the right lower lobe. Musculoskeletal: Degenerative changes of the thoracic spine are noted. No acute rib abnormality is noted. Review of the MIP images  confirms the above findings. CT ABDOMEN and PELVIS FINDINGS Hepatobiliary: Gallbladder is partially distended with a few small dependent gallstones. No pericholecystic fluid is noted. The liver shows fatty infiltration. Pancreas: Unremarkable. No pancreatic ductal dilatation or surrounding inflammatory changes. Spleen: Normal in size without focal abnormality. Adrenals/Urinary Tract: Adrenal glands are unremarkable bilaterally. Kidneys are well visualized bilaterally with large bilateral renal cysts. The largest of these on the right measures 8.5 cm. The largest of these on the left measures 13 cm in greatest dimension. Normal enhancement and excretion is seen. Bladder is partially distended. Ureters are within normal limits. No renal  calculi are seen. Stomach/Bowel: Scattered diverticular change of the colon is noted. Very mild pericolonic inflammatory changes noted in the proximal sigmoid colon consistent with early diverticulitis. No perforation or abscess formation is noted. The appendix is within normal limits. The remainder of the colon is unremarkable. Stomach is decompressed. Small bowel is unremarkable. Vascular/Lymphatic: Aortic atherosclerosis. No enlarged abdominal or pelvic lymph nodes. Reproductive: Prostate is unremarkable. Other: No abdominal wall hernia or abnormality. No abdominopelvic ascites. Musculoskeletal: Degenerative changes of lumbar spine are noted. No compression deformity is noted. Review of the MIP images confirms the above findings. IMPRESSION: CTA of the chest: No evidence of pulmonary emboli. Mild dependent atelectatic changes. Prior granulomatous disease. Aortic Atherosclerosis (ICD10-I70.0). CT of the abdomen and pelvis: Mild diverticulitis in the sigmoid without evidence of perforation or abscess formation. Cholelithiasis without complicating factors. Bilateral renal cysts as described. No obstructive changes are noted. Electronically Signed   By: Inez Catalina M.D.   On:  04/14/2020 18:09   CT ABDOMEN PELVIS W CONTRAST  Result Date: 04/14/2020 CLINICAL DATA:  Shortness of breath with fevers and abdominal pain EXAM: CT ANGIOGRAPHY CHEST CT ABDOMEN AND PELVIS WITH CONTRAST TECHNIQUE: Multidetector CT imaging of the chest was performed using the standard protocol during bolus administration of intravenous contrast. Multiplanar CT image reconstructions and MIPs were obtained to evaluate the vascular anatomy. Multidetector CT imaging of the abdomen and pelvis was performed using the standard protocol during bolus administration of intravenous contrast. CONTRAST:  26mL OMNIPAQUE IOHEXOL 350 MG/ML SOLN COMPARISON:  None. FINDINGS: CTA CHEST FINDINGS Cardiovascular: Thoracic aorta demonstrates mild atherosclerotic calcifications. No aneurysmal dilatation is seen. No significant opacification is noted. Coronary calcifications are seen. No cardiac enlargement is noted. The pulmonary artery is well visualized with a normal branching pattern bilaterally. No filling defect to suggest pulmonary embolism is noted. Mediastinum/Nodes: Thoracic inlet is within normal limits. No sizable hilar or mediastinal adenopathy is noted. A few small calcified nodes are seen consistent with prior granulomatous disease. The esophagus is within normal limits. Lungs/Pleura: Mild dependent atelectatic changes are noted bilaterally. Some mosaic attenuation in the lungs is noted consistent with air trapping. No focal confluent infiltrate or sizable effusion is seen. No parenchymal nodules are noted. Calcified granuloma is noted in the right lower lobe. Musculoskeletal: Degenerative changes of the thoracic spine are noted. No acute rib abnormality is noted. Review of the MIP images confirms the above findings. CT ABDOMEN and PELVIS FINDINGS Hepatobiliary: Gallbladder is partially distended with a few small dependent gallstones. No pericholecystic fluid is noted. The liver shows fatty infiltration. Pancreas:  Unremarkable. No pancreatic ductal dilatation or surrounding inflammatory changes. Spleen: Normal in size without focal abnormality. Adrenals/Urinary Tract: Adrenal glands are unremarkable bilaterally. Kidneys are well visualized bilaterally with large bilateral renal cysts. The largest of these on the right measures 8.5 cm. The largest of these on the left measures 13 cm in greatest dimension. Normal enhancement and excretion is seen. Bladder is partially distended. Ureters are within normal limits. No renal calculi are seen. Stomach/Bowel: Scattered diverticular change of the colon is noted. Very mild pericolonic inflammatory changes noted in the proximal sigmoid colon consistent with early diverticulitis. No perforation or abscess formation is noted. The appendix is within normal limits. The remainder of the colon is unremarkable. Stomach is decompressed. Small bowel is unremarkable. Vascular/Lymphatic: Aortic atherosclerosis. No enlarged abdominal or pelvic lymph nodes. Reproductive: Prostate is unremarkable. Other: No abdominal wall hernia or abnormality. No abdominopelvic ascites. Musculoskeletal: Degenerative changes of lumbar spine are noted.  No compression deformity is noted. Review of the MIP images confirms the above findings. IMPRESSION: CTA of the chest: No evidence of pulmonary emboli. Mild dependent atelectatic changes. Prior granulomatous disease. Aortic Atherosclerosis (ICD10-I70.0). CT of the abdomen and pelvis: Mild diverticulitis in the sigmoid without evidence of perforation or abscess formation. Cholelithiasis without complicating factors. Bilateral renal cysts as described. No obstructive changes are noted. Electronically Signed   By: Inez Catalina M.D.   On: 04/14/2020 18:09   US Abdomen Limited  Result Date: 04/14/2020 CLINICAL DATA:  Cholelithiasis. EXAM: ULTRASOUND ABDOMEN LIMITED RIGHT UPPER QUADRANT COMPARISON:  CT abdomen and pelvis 04/14/2020 FINDINGS: Gallbladder: Gallstones  measuring up to 7 mm. No gallbladder wall thickening. No sonographic Murphy sign noted by sonographer. Common bile duct: Diameter: 3 mm Liver: Diffusely increased parenchymal echogenicity without a focal lesion identified. Portal vein is patent on color Doppler imaging with normal direction of blood flow towards the liver. Other: Incidental 2.4 cm complex, partially hyperechoic mass in the upper pole of the right kidney which appears solid with some blood flow on color Doppler imaging. IMPRESSION: 1. Cholelithiasis without evidence of cholecystitis. 2. 2.4 cm right upper pole renal mass highly suspicious for neoplasm. Consider nonemergent abdominal MRI without and with contrast (preferably as an outpatient) for further evaluation. 3. Echogenic liver suggestive of steatosis. Electronically Signed   By: Logan Bores M.D.   On: 04/14/2020 20:33   MR ABDOMEN MRCP W WO CONTAST  Result Date: 04/14/2020 CLINICAL DATA:  Cholelithiasis EXAM: MRI ABDOMEN WITHOUT AND WITH CONTRAST (INCLUDING MRCP) TECHNIQUE: Multiplanar multisequence MR imaging of the abdomen was performed both before and after the administration of intravenous contrast. Heavily T2-weighted images of the biliary and pancreatic ducts were obtained, and three-dimensional MRCP images were rendered by post processing. CONTRAST:  9.10mL GADAVIST GADOBUTROL 1 MMOL/ML IV SOLN COMPARISON:  CT and ultrasound from earlier in the same day. FINDINGS: Lower chest: No focal infiltrate or sizable effusion is noted. Hepatobiliary: Tiny cyst is noted along the anterior margin of the right lobe of the liver measuring less than 1 cm better visualized than on the prior CT examination. Hypointense area is noted in the tip of the right lobe of the liver better visualized than on prior CT which demonstrates some peripheral enhancement and filling in on delayed post gadolinium images consistent with a small hemangioma. This measures approximate with 1.4 cm in dimension. No other  areas of abnormal enhancement are noted within the liver. Gallbladder is well visualized and demonstrates dependent cholelithiasis similar to that seen on prior CT and ultrasound. The biliary tree is within normal limits. No findings to suggest choledocholithiasis are seen. Pancreas:  Pancreas is well visualized and within normal limits. Spleen:  Within normal limits in size and appearance. Adrenals/Urinary Tract: Adrenal glands are within normal limits. Multiple renal cysts are again identified similar to that seen on prior CT examination. Hypointense lesion is noted in the upper pole of right kidney measuring approximately 2.6 cm. Following contrast administration there is irregular enhancement identified consistent with a renal cell neoplasm. No obstructive changes are seen. Bladder is not visualized on these images. Stomach/Bowel: There is some mild inflammatory change surrounding the second portion the duodenum better visualized than on the prior CT examination. This is consistent with duodenitis. No findings to suggest perforation are noted. Small duodenal diverticulum is noted with fluid within adjacent to the ampulla of Vater. The remainder of the visualized small bowel appears within normal limits. Visualized colon is unremarkable. The previously  seen inflammatory changes in the sigmoid are not evaluated on this exam. Vascular/Lymphatic: No aneurysmal dilatation is seen. No significant lymphadenopathy is noted. Other:  No free fluid is noted. Musculoskeletal: Visualized bony structures show no acute abnormality. IMPRESSION: Changes consistent with cholelithiasis. No choledocholithiasis is seen. No gallbladder inflammatory changes noted. Thickening and mild inflammatory changes in the second portion the duodenum consistent with duodenitis. A small duodenal diverticulum is identified with fluid within. No definitive perforation is seen. This may contribute to the patient's elevated LFTs although no biliary  ductal dilatation is seen. 2.6 cm enhancing mass lesion in the upper pole of the right kidney as described consistent with renal neoplasm. Small hepatic cyst and small hepatic hemangioma. Electronically Signed   By: Inez Catalina M.D.   On: 04/14/2020 23:17    Anti-infectives: Anti-infectives (From admission, onward)   Start     Dose/Rate Route Frequency Ordered Stop   04/14/20 2200  piperacillin-tazobactam (ZOSYN) IVPB 3.375 g        3.375 g 12.5 mL/hr over 240 Minutes Intravenous Every 8 hours 04/14/20 1937        Assessment/Plan: Problem List: Patient Active Problem List   Diagnosis Date Noted  . Fever 04/14/2020  . AKI (acute kidney injury) ([place]) 04/14/2020  . Cholangitis 04/14/2020  . Unstable angina ([place])   . Chest pain 04/11/2020  . Left elbow pain 11/18/2019  . Pain and swelling of right lower leg 06/23/2019  . Coronary artery disease involving native coronary artery of native heart with angina pectoris ([place]) 01/18/2019  . Medicare annual wellness visit, subsequent 01/16/2019  . Ecchymosis 10/22/2018  . Obesity (BMI 30-39.9) 07/09/2018  . HOH (hard of hearing) 03/31/2018  . Uncontrolled type 2 diabetes mellitus with hyperglycemia ([place]) 10/28/2017  . Subclinical hypothyroidism 10/28/2017  . Scoliosis   . Obstructive sleep apnea on CPAP   . Hx of skin cancer, basal cell   . Hyperlipidemia associated with type 2 diabetes mellitus ([place])   . GERD (gastroesophageal reflux disease)   . Chronic upper back pain   . Arthritis   . Allergy   . NSTEMI (non-ST elevated myocardial infarction) ([place]) 06/18/2017  . Actinic keratoses 04/29/2017  . Positive colorectal cancer screening using Cologuard test 10/31/2016  . BPH (benign prostatic hyperplasia) 09/04/2016  . B12 deficiency 11/22/2015  . Routine general medical examination at a health care facility 08/26/2015  . Abnormal nuclear cardiac imaging test   . Dyspnea on exertion 11/27/2013  . Encounter for examination of normal  volunteer in research study 05/29/2013  . Prostate cancer screening 07/15/2012  . CAD S/P percutaneous coronary angioplasty 05/17/2011  . Nonspecific abnormal results of cardiovascular function study 04/30/2011  . Abnormal EKG 04/06/2011  . Right low back pain 01/13/2010  . Insulin dependent diabetes mellitus 10/04/2006  . ERECTILE DYSFUNCTION 10/04/2006  . Essential hypertension 10/04/2006  . Allergic rhinitis 10/04/2006  . OSTEOARTHRITIS 10/04/2006  . Sleep apnea 10/04/2006  . EDEMA 10/04/2006  . SKIN CANCER, HX OF 10/04/2006    I had a long conversation with them and I am trying to get him to consider CABG if [facility] and cardiology are willing to offer this.  I encouraged him to speak with Dr. Stanford Breed.   * No surgery found *    LOS: 2 days   Matt B. Hassell Done, MD, [facility] [phone] to reach the surgeon on call.    04/16/2020 9:07 AM

## 2020-04-16 NOTE — Progress Notes (Signed)
[facility] PROGRESS NOTE   Ryan Garcia VZD:[id number] DOB: 04-29-1934 DOA: 04/14/2020  PCP: Abner Greenspan, MD  Brief History/Interval Summary: 86 y.o. male with medical history significant for extensive cardiac hx, HTN, DM, HLD, CKD - severe 3v dz previously treated with PCI as he wanted to avoid cabg - recently discharged on 10/12 following left heart cath on 10/12 during admission for unstable angina. He was found to have severe 2v disease without any stents placed and is being treated medically. He was seen in urgent care for some right sided chest/rib pain and referred to [facility] ED. He reports having fever and chills since he was discharged 2 days ago after the heart catheterization.  He reports having some substernal chest pressure that did not radiate and was associated with shortness of breath when he would exert himself.  He also reports right upper quadrant abdominal pain that is worsened with movement and deep inspiration that began the day before he was discharged.  His wife reports he has not looked well and has had a slight pale and yellow appearance since he was discharged home.  Reports the pain in the abdomen does not radiate.  He has not noticed any alleviating factors for the pain.  He states he has not had a good appetite the last few days.  He was found to have elevated liver enzymes and total bilirubin.  CT of the abdomen pelvis showed gallstones and possible mild diverticulitis although he has no left-sided abdominal pain.  Right upper quadrant ultrasound showed no acute cholecystitis and possible left adrenal mass will need further work-up.  He reports he has been having some diarrhea for the last 2 days without blood or mucus in it.   Reason for Visit: Right upper quadrant abdominal pain  Consultants: General surgery.  Cardiology.  Gastroenterology  Procedures: None yet  Antibiotics: Anti-infectives (From admission, onward)   Start     Dose/Rate Route Frequency  Ordered Stop   04/14/20 2200  piperacillin-tazobactam (ZOSYN) IVPB 3.375 g        3.375 g 12.5 mL/hr over 240 Minutes Intravenous Every 8 hours 04/14/20 1937        Subjective/Interval History: Patient noted to be in bad mood this morning.  Complaining about his food, hs IV access, his antibiotics.  Denies any abdominal pain however.  No nausea vomiting.    Assessment/Plan:  Right upper quadrant abdominal pain with abnormal LFTs Patient noted to have transaminitis along with hyperbilirubinemia.  His alkaline phosphatase was also elevated.  Concern was for cholangitis.  Patient was started on Zosyn.  Patient underwent MRCP which raises concern for duodenitis.  General surgery and gastroenterology were also consulted.   Gastroenterology feels that patient may have passed a stone.  Considering clinical improvement did not recommend any ERCP or other procedures.  Antibiotics was recommended for 10 days.  Since patient is otherwise improving we will transition him to oral antibiotics today.   Acute kidney injury Presented with a creatinine of 1.91.  When he was discharged on 10/12 his creatinine was 1.45.  Likely due to hypovolemia.   Patient was hydrated.  His creatinine noted to be slightly higher today.  Unclear if patient has underlying chronic kidney disease.  We will hold his ACE inhibitor today.  We will give him IV fluid bolus and recheck his labs tomorrow.  He has been urinating.    Cholelithiasis MRI does not suggest cholecystitis or choledocholithiasis.  General surgery is following.  No  plans for surgery at this time as he will be considered high risk due to his cardiac history.  Right renal mass Will need outpatient referral to urology.  Coronary artery disease Recently seen by cardiology.  Underwent cardiac catheterization on 10/12.  Cardiology has been following.  Patient on aspirin beta-blocker Imdur.  ACE inhibitor to be held due to rising creatinine.  No plans for cardiac  procedures at this time.    Diabetes mellitus type 2, uncontrolled with hyperglycemia HbA1c checked recently was 7.0.  CBGs are stable.  Continue Lantus and SSI.    Hyperlipidemia Statin is currently on hold due to elevated LFTs.  Essential hypertension Blood pressure is reasonably well controlled.  Normocytic anemia/mild thrombocytopenia Monitor counts closely.  No evidence for overt bleeding.  Counts are stable.  Obesity Estimated body mass index is 34.87 kg/m as calculated from the following:   Height as of 04/11/20: 5\' 11"  (1.803 m).   Weight as of 04/11/20: 113.4 kg.    DVT Prophylaxis: Subcutaneous heparin Code Status: Full code Family Communication: Discussed with the patient.  No family at bedside Disposition Plan: Hopefully return home in improved  Status is: Inpatient  Remains inpatient appropriate because:IV treatments appropriate due to intensity of illness or inability to take PO   Dispo: The patient is from: Home              Anticipated d/c is to: Home              Anticipated d/c date is: 10/17              Patient currently is not medically stable to d/c.      Medications:  Scheduled: . aspirin EC  81 mg Oral Daily  . heparin  5,000 Units Subcutaneous Q8H  . insulin aspart  0-15 Units Subcutaneous TID WC  . insulin aspart  0-5 Units Subcutaneous QHS  . insulin glargine  50 Units Subcutaneous QHS  . isosorbide mononitrate  30 mg Oral Daily  . metoprolol tartrate  25 mg Oral BID  . pantoprazole  40 mg Oral Q0600   Continuous: . lactated ringers 50 mL/hr at 04/16/20 1015  . piperacillin-tazobactam (ZOSYN)  IV 12.5 mL/hr at 04/16/20 0700   WIO:[id number] **OR** acetaminophen, nitroGLYCERIN, ondansetron **OR** ondansetron (ZOFRAN) IV   Objective:  Vital Signs  Vitals:   04/15/20 1102 04/15/20 1709 04/15/20 2032 04/16/20 0446  BP: 129/65 135/79 136/64 131/62  Pulse: 87 (!) 48 67 (!) 59  Resp: 18 18 16 18   Temp: 99 F (37.2 C) 98.2 F  (36.8 C) 98.7 F (37.1 C) 98.5 F (36.9 C)  TempSrc: Oral Oral Oral Oral  SpO2: 98% 96% 100% 97%    Intake/Output Summary (Last 24 hours) at 04/16/2020 1047 Last data filed at 04/16/2020 0806 Gross per 24 hour  Intake 1770.11 ml  Output 1725 ml  Net 45.11 ml   There were no vitals filed for this visit.  General appearance: Awake alert.  In no distress Resp: Clear to auscultation bilaterally.  Normal effort Cardio: S1-S2 is normal regular.  No S3-S4.  No rubs murmurs or bruit GI: Abdomen is soft.  Nontender nondistended.  Bowel sounds are present normal.  No masses organomegaly Extremities: No edema.  Full range of motion of lower extremities. Neurologic: Alert and oriented x3.  No focal neurological deficits.    Lab Results:  Data Reviewed: I have personally reviewed following labs and imaging studies  CBC: Recent Labs  Lab 04/11/20 1437  04/11/20 1437 04/12/20 0233 04/14/20 1442 04/14/20 2342 04/15/20 0857 04/16/20 0111  WBC 9.9   < > 7.2 5.5 5.4 5.9 6.7  NEUTROABS 6.8  --   --   --   --  3.6  --   HGB 12.2*   < > 11.8* 12.8* 12.3* 12.3* 11.6*  HCT 38.8*   < > 36.3* 40.3 36.8* 37.0* 34.9*  MCV 96.0   < > 92.6 93.7 90.9 91.1 93.3  PLT 165   < > 153 144* 132* 138* 147*   < > = values in this interval not displayed.    Basic Metabolic Panel: Recent Labs  Lab 04/12/20 0233 04/14/20 1442 04/14/20 2342 04/15/20 0857 04/16/20 0111  NA 137 135 134* 136 138  K 4.4 3.9 3.6 3.6 3.8  CL 105 104 102 102 104  CO2 21* 21* 22 24 25   GLUCOSE 212* 351* 194* 184* 211*  BUN 28* 27* 25* 24* 27*  CREATININE 1.45* 1.91* 1.62* 1.58* 1.75*  CALCIUM 8.8* 8.6* 8.4* 8.5* 8.3*    GFR: Estimated Creatinine Clearance: 40.2 mL/min (A) (by C-G formula based on SCr of 1.75 mg/dL (H)).  Liver Function Tests: Recent Labs  Lab 04/11/20 1437 04/14/20 1711 04/14/20 2342 04/15/20 0857 04/16/20 0111  AST 87* 131* 94* 84* 67*  ALT 46* 322* 271* 236* 192*  ALKPHOS 84 214* 198*  194* 197*  BILITOT 1.5* 2.1* 1.8* 1.5* 1.3*  PROT 6.1* 6.2* 5.9* 5.8* 5.4*  ALBUMIN 3.3* 3.0* 2.7* 2.6* 2.5*    Recent Labs  Lab 04/11/20 1437 04/14/20 1711 04/16/20 0111  LIPASE 23 28 28     CBG: Recent Labs  Lab 04/15/20 0826 04/15/20 1206 04/15/20 1700 04/15/20 2120 04/16/20 0804  GLUCAP 171* 157* 259* 296* 123*     Recent Results (from the past 240 hour(s))  Respiratory Panel by RT PCR (Flu A&B, Covid) - Nasopharyngeal Swab     Status: None   Collection Time: 04/11/20  2:30 PM   Specimen: Nasopharyngeal Swab  Result Value Ref Range Status   SARS Coronavirus 2 by RT PCR NEGATIVE NEGATIVE Final    Comment: (NOTE) SARS-CoV-2 target nucleic acids are NOT DETECTED.  The SARS-CoV-2 RNA is generally detectable in upper respiratoy specimens during the acute phase of infection. The lowest concentration of SARS-CoV-2 viral copies this assay can detect is 131 copies/mL. A negative result does not preclude SARS-Cov-2 infection and should not be used as the sole basis for treatment or other patient management decisions. A negative result may occur with  improper specimen collection/handling, submission of specimen other than nasopharyngeal swab, presence of viral mutation(s) within the areas targeted by this assay, and inadequate number of viral copies (<131 copies/mL). A negative result must be combined with clinical observations, patient history, and epidemiological information. The expected result is Negative.  Fact Sheet for Patients:  PinkCheek.be  Fact Sheet for Healthcare Providers:  GravelBags.it  This test is no t yet approved or cleared by the [place] FDA and  has been authorized for detection and/or diagnosis of SARS-CoV-2 by FDA under an Emergency Use Authorization (EUA). This EUA will remain  in effect (meaning this test can be used) for the duration of the COVID-19 declaration under Section  564(b)(1) of the Act, 21 U.S.C. section 360bbb-3(b)(1), unless the authorization is terminated or revoked sooner.     Influenza A by PCR NEGATIVE NEGATIVE Final   Influenza B by PCR NEGATIVE NEGATIVE Final    Comment: (NOTE) The Xpert Xpress SARS-CoV-2/FLU/RSV  assay is intended as an aid in  the diagnosis of influenza from Nasopharyngeal swab specimens and  should not be used as a sole basis for treatment. Nasal washings and  aspirates are unacceptable for Xpert Xpress SARS-CoV-2/FLU/RSV  testing.  Fact Sheet for Patients: PinkCheek.be  Fact Sheet for Healthcare Providers: GravelBags.it  This test is not yet approved or cleared by the [place] FDA and  has been authorized for detection and/or diagnosis of SARS-CoV-2 by  FDA under an Emergency Use Authorization (EUA). This EUA will remain  in effect (meaning this test can be used) for the duration of the  Covid-19 declaration under Section 564(b)(1) of the Act, 21  U.S.C. section 360bbb-3(b)(1), unless the authorization is  terminated or revoked. Performed at [facility], [place] [address], [place], [place] [address]   Respiratory Panel by RT PCR (Flu A&B, Covid) - Nasopharyngeal Swab     Status: None   Collection Time: 04/14/20  5:11 PM   Specimen: Nasopharyngeal Swab  Result Value Ref Range Status   SARS Coronavirus 2 by RT PCR NEGATIVE NEGATIVE Final    Comment: (NOTE) SARS-CoV-2 target nucleic acids are NOT DETECTED.  The SARS-CoV-2 RNA is generally detectable in upper respiratoy specimens during the acute phase of infection. The lowest concentration of SARS-CoV-2 viral copies this assay can detect is 131 copies/mL. A negative result does not preclude SARS-Cov-2 infection and should not be used as the sole basis for treatment or other patient management decisions. A negative result may occur with  improper specimen collection/handling, submission of  specimen other than nasopharyngeal swab, presence of viral mutation(s) within the areas targeted by this assay, and inadequate number of viral copies (<131 copies/mL). A negative result must be combined with clinical observations, patient history, and epidemiological information. The expected result is Negative.  Fact Sheet for Patients:  PinkCheek.be  Fact Sheet for Healthcare Providers:  GravelBags.it  This test is no t yet approved or cleared by the [place] FDA and  has been authorized for detection and/or diagnosis of SARS-CoV-2 by FDA under an Emergency Use Authorization (EUA). This EUA will remain  in effect (meaning this test can be used) for the duration of the COVID-19 declaration under Section 564(b)(1) of the Act, 21 U.S.C. section 360bbb-3(b)(1), unless the authorization is terminated or revoked sooner.     Influenza A by PCR NEGATIVE NEGATIVE Final   Influenza B by PCR NEGATIVE NEGATIVE Final    Comment: (NOTE) The Xpert Xpress SARS-CoV-2/FLU/RSV assay is intended as an aid in  the diagnosis of influenza from Nasopharyngeal swab specimens and  should not be used as a sole basis for treatment. Nasal washings and  aspirates are unacceptable for Xpert Xpress SARS-CoV-2/FLU/RSV  testing.  Fact Sheet for Patients: PinkCheek.be  Fact Sheet for Healthcare Providers: GravelBags.it  This test is not yet approved or cleared by the [place] FDA and  has been authorized for detection and/or diagnosis of SARS-CoV-2 by  FDA under an Emergency Use Authorization (EUA). This EUA will remain  in effect (meaning this test can be used) for the duration of the  Covid-19 declaration under Section 564(b)(1) of the Act, 21  U.S.C. section 360bbb-3(b)(1), unless the authorization is  terminated or revoked. Performed at [facility], [place] [address],  [place], [place] [address]   Blood culture (routine x 2)     Status: None (Preliminary result)   Collection Time: 04/14/20  5:11 PM   Specimen: BLOOD  Result  Value Ref Range Status   Specimen Description BLOOD BLOOD RIGHT FOREARM  Final   Special Requests   Final    BOTTLES DRAWN AEROBIC AND ANAEROBIC Blood Culture adequate volume   Culture   Final    NO GROWTH 2 DAYS Performed at [facility], [address], [place], [place] [address]    Report Status PENDING  Incomplete  Blood culture (routine x 2)     Status: None (Preliminary result)   Collection Time: 04/14/20  5:11 PM   Specimen: BLOOD  Result Value Ref Range Status   Specimen Description BLOOD RIGHT ANTECUBITAL  Final   Special Requests   Final    BOTTLES DRAWN AEROBIC AND ANAEROBIC Blood Culture adequate volume   Culture   Final    NO GROWTH 2 DAYS Performed at [facility], [place] [address] [place], [place] [address]    Report Status PENDING  Incomplete  Urine culture     Status: Abnormal   Collection Time: 04/14/20  8:22 PM   Specimen: Urine, Random  Result Value Ref Range Status   Specimen Description URINE, RANDOM  Final   Special Requests NONE  Final   Culture (A)  Final    <10,000 COLONIES/mL INSIGNIFICANT GROWTH Performed at [facility], [place] [address], [place], [place] [address]    Report Status 04/16/2020 FINAL  Final      Radiology Studies: DG Chest 2 View  Result Date: 04/14/2020 CLINICAL DATA:  Chest pain and fever for 1 day EXAM: CHEST - 2 VIEW COMPARISON:  04/11/2020 FINDINGS: Cardiac shadow is mildly enlarged but stable. The lungs are well aerated bilaterally. Mild atelectatic changes are again noted in the left base laterally. No sizable effusion is noted. No bony abnormality is seen. IMPRESSION: Stable left basilar atelectasis. Electronically Signed   By: Inez Catalina M.D.   On: 04/14/2020 15:27   CT Angio Chest PE W and/or Wo Contrast  Result Date: 04/14/2020 CLINICAL DATA:   Shortness of breath with fevers and abdominal pain EXAM: CT ANGIOGRAPHY CHEST CT ABDOMEN AND PELVIS WITH CONTRAST TECHNIQUE: Multidetector CT imaging of the chest was performed using the standard protocol during bolus administration of intravenous contrast. Multiplanar CT image reconstructions and MIPs were obtained to evaluate the vascular anatomy. Multidetector CT imaging of the abdomen and pelvis was performed using the standard protocol during bolus administration of intravenous contrast. CONTRAST:  48mL OMNIPAQUE IOHEXOL 350 MG/ML SOLN COMPARISON:  None. FINDINGS: CTA CHEST FINDINGS Cardiovascular: Thoracic aorta demonstrates mild atherosclerotic calcifications. No aneurysmal dilatation is seen. No significant opacification is noted. Coronary calcifications are seen. No cardiac enlargement is noted. The pulmonary artery is well visualized with a normal branching pattern bilaterally. No filling defect to suggest pulmonary embolism is noted. Mediastinum/Nodes: Thoracic inlet is within normal limits. No sizable hilar or mediastinal adenopathy is noted. A few small calcified nodes are seen consistent with prior granulomatous disease. The esophagus is within normal limits. Lungs/Pleura: Mild dependent atelectatic changes are noted bilaterally. Some mosaic attenuation in the lungs is noted consistent with air trapping. No focal confluent infiltrate or sizable effusion is seen. No parenchymal nodules are noted. Calcified granuloma is noted in the right lower lobe. Musculoskeletal: Degenerative changes of the thoracic spine are noted. No acute rib abnormality is noted. Review of the MIP images confirms the above findings. CT ABDOMEN and PELVIS FINDINGS Hepatobiliary: Gallbladder is partially distended with a few small dependent gallstones. No pericholecystic fluid is noted. The liver shows  fatty infiltration. Pancreas: Unremarkable. No pancreatic ductal dilatation or surrounding inflammatory changes. Spleen: Normal in  size without focal abnormality. Adrenals/Urinary Tract: Adrenal glands are unremarkable bilaterally. Kidneys are well visualized bilaterally with large bilateral renal cysts. The largest of these on the right measures 8.5 cm. The largest of these on the left measures 13 cm in greatest dimension. Normal enhancement and excretion is seen. Bladder is partially distended. Ureters are within normal limits. No renal calculi are seen. Stomach/Bowel: Scattered diverticular change of the colon is noted. Very mild pericolonic inflammatory changes noted in the proximal sigmoid colon consistent with early diverticulitis. No perforation or abscess formation is noted. The appendix is within normal limits. The remainder of the colon is unremarkable. Stomach is decompressed. Small bowel is unremarkable. Vascular/Lymphatic: Aortic atherosclerosis. No enlarged abdominal or pelvic lymph nodes. Reproductive: Prostate is unremarkable. Other: No abdominal wall hernia or abnormality. No abdominopelvic ascites. Musculoskeletal: Degenerative changes of lumbar spine are noted. No compression deformity is noted. Review of the MIP images confirms the above findings. IMPRESSION: CTA of the chest: No evidence of pulmonary emboli. Mild dependent atelectatic changes. Prior granulomatous disease. Aortic Atherosclerosis (ICD10-I70.0). CT of the abdomen and pelvis: Mild diverticulitis in the sigmoid without evidence of perforation or abscess formation. Cholelithiasis without complicating factors. Bilateral renal cysts as described. No obstructive changes are noted. Electronically Signed   By: Inez Catalina M.D.   On: 04/14/2020 18:09   CT ABDOMEN PELVIS W CONTRAST  Result Date: 04/14/2020 CLINICAL DATA:  Shortness of breath with fevers and abdominal pain EXAM: CT ANGIOGRAPHY CHEST CT ABDOMEN AND PELVIS WITH CONTRAST TECHNIQUE: Multidetector CT imaging of the chest was performed using the standard protocol during bolus administration of  intravenous contrast. Multiplanar CT image reconstructions and MIPs were obtained to evaluate the vascular anatomy. Multidetector CT imaging of the abdomen and pelvis was performed using the standard protocol during bolus administration of intravenous contrast. CONTRAST:  68mL OMNIPAQUE IOHEXOL 350 MG/ML SOLN COMPARISON:  None. FINDINGS: CTA CHEST FINDINGS Cardiovascular: Thoracic aorta demonstrates mild atherosclerotic calcifications. No aneurysmal dilatation is seen. No significant opacification is noted. Coronary calcifications are seen. No cardiac enlargement is noted. The pulmonary artery is well visualized with a normal branching pattern bilaterally. No filling defect to suggest pulmonary embolism is noted. Mediastinum/Nodes: Thoracic inlet is within normal limits. No sizable hilar or mediastinal adenopathy is noted. A few small calcified nodes are seen consistent with prior granulomatous disease. The esophagus is within normal limits. Lungs/Pleura: Mild dependent atelectatic changes are noted bilaterally. Some mosaic attenuation in the lungs is noted consistent with air trapping. No focal confluent infiltrate or sizable effusion is seen. No parenchymal nodules are noted. Calcified granuloma is noted in the right lower lobe. Musculoskeletal: Degenerative changes of the thoracic spine are noted. No acute rib abnormality is noted. Review of the MIP images confirms the above findings. CT ABDOMEN and PELVIS FINDINGS Hepatobiliary: Gallbladder is partially distended with a few small dependent gallstones. No pericholecystic fluid is noted. The liver shows fatty infiltration. Pancreas: Unremarkable. No pancreatic ductal dilatation or surrounding inflammatory changes. Spleen: Normal in size without focal abnormality. Adrenals/Urinary Tract: Adrenal glands are unremarkable bilaterally. Kidneys are well visualized bilaterally with large bilateral renal cysts. The largest of these on the right measures 8.5 cm. The  largest of these on the left measures 13 cm in greatest dimension. Normal enhancement and excretion is seen. Bladder is partially distended. Ureters are within normal limits. No renal calculi are seen. Stomach/Bowel: Scattered diverticular change of the  colon is noted. Very mild pericolonic inflammatory changes noted in the proximal sigmoid colon consistent with early diverticulitis. No perforation or abscess formation is noted. The appendix is within normal limits. The remainder of the colon is unremarkable. Stomach is decompressed. Small bowel is unremarkable. Vascular/Lymphatic: Aortic atherosclerosis. No enlarged abdominal or pelvic lymph nodes. Reproductive: Prostate is unremarkable. Other: No abdominal wall hernia or abnormality. No abdominopelvic ascites. Musculoskeletal: Degenerative changes of lumbar spine are noted. No compression deformity is noted. Review of the MIP images confirms the above findings. IMPRESSION: CTA of the chest: No evidence of pulmonary emboli. Mild dependent atelectatic changes. Prior granulomatous disease. Aortic Atherosclerosis (ICD10-I70.0). CT of the abdomen and pelvis: Mild diverticulitis in the sigmoid without evidence of perforation or abscess formation. Cholelithiasis without complicating factors. Bilateral renal cysts as described. No obstructive changes are noted. Electronically Signed   By: Inez Catalina M.D.   On: 04/14/2020 18:09   US Abdomen Limited  Result Date: 04/14/2020 CLINICAL DATA:  Cholelithiasis. EXAM: ULTRASOUND ABDOMEN LIMITED RIGHT UPPER QUADRANT COMPARISON:  CT abdomen and pelvis 04/14/2020 FINDINGS: Gallbladder: Gallstones measuring up to 7 mm. No gallbladder wall thickening. No sonographic Murphy sign noted by sonographer. Common bile duct: Diameter: 3 mm Liver: Diffusely increased parenchymal echogenicity without a focal lesion identified. Portal vein is patent on color Doppler imaging with normal direction of blood flow towards the liver. Other:  Incidental 2.4 cm complex, partially hyperechoic mass in the upper pole of the right kidney which appears solid with some blood flow on color Doppler imaging. IMPRESSION: 1. Cholelithiasis without evidence of cholecystitis. 2. 2.4 cm right upper pole renal mass highly suspicious for neoplasm. Consider nonemergent abdominal MRI without and with contrast (preferably as an outpatient) for further evaluation. 3. Echogenic liver suggestive of steatosis. Electronically Signed   By: Logan Bores M.D.   On: 04/14/2020 20:33   MR ABDOMEN MRCP W WO CONTAST  Result Date: 04/14/2020 CLINICAL DATA:  Cholelithiasis EXAM: MRI ABDOMEN WITHOUT AND WITH CONTRAST (INCLUDING MRCP) TECHNIQUE: Multiplanar multisequence MR imaging of the abdomen was performed both before and after the administration of intravenous contrast. Heavily T2-weighted images of the biliary and pancreatic ducts were obtained, and three-dimensional MRCP images were rendered by post processing. CONTRAST:  9.76mL GADAVIST GADOBUTROL 1 MMOL/ML IV SOLN COMPARISON:  CT and ultrasound from earlier in the same day. FINDINGS: Lower chest: No focal infiltrate or sizable effusion is noted. Hepatobiliary: Tiny cyst is noted along the anterior margin of the right lobe of the liver measuring less than 1 cm better visualized than on the prior CT examination. Hypointense area is noted in the tip of the right lobe of the liver better visualized than on prior CT which demonstrates some peripheral enhancement and filling in on delayed post gadolinium images consistent with a small hemangioma. This measures approximate with 1.4 cm in dimension. No other areas of abnormal enhancement are noted within the liver. Gallbladder is well visualized and demonstrates dependent cholelithiasis similar to that seen on prior CT and ultrasound. The biliary tree is within normal limits. No findings to suggest choledocholithiasis are seen. Pancreas:  Pancreas is well visualized and within normal  limits. Spleen:  Within normal limits in size and appearance. Adrenals/Urinary Tract: Adrenal glands are within normal limits. Multiple renal cysts are again identified similar to that seen on prior CT examination. Hypointense lesion is noted in the upper pole of right kidney measuring approximately 2.6 cm. Following contrast administration there is irregular enhancement identified consistent with a renal cell  neoplasm. No obstructive changes are seen. Bladder is not visualized on these images. Stomach/Bowel: There is some mild inflammatory change surrounding the second portion the duodenum better visualized than on the prior CT examination. This is consistent with duodenitis. No findings to suggest perforation are noted. Small duodenal diverticulum is noted with fluid within adjacent to the ampulla of Vater. The remainder of the visualized small bowel appears within normal limits. Visualized colon is unremarkable. The previously seen inflammatory changes in the sigmoid are not evaluated on this exam. Vascular/Lymphatic: No aneurysmal dilatation is seen. No significant lymphadenopathy is noted. Other:  No free fluid is noted. Musculoskeletal: Visualized bony structures show no acute abnormality. IMPRESSION: Changes consistent with cholelithiasis. No choledocholithiasis is seen. No gallbladder inflammatory changes noted. Thickening and mild inflammatory changes in the second portion the duodenum consistent with duodenitis. A small duodenal diverticulum is identified with fluid within. No definitive perforation is seen. This may contribute to the patient's elevated LFTs although no biliary ductal dilatation is seen. 2.6 cm enhancing mass lesion in the upper pole of the right kidney as described consistent with renal neoplasm. Small hepatic cyst and small hepatic hemangioma. Electronically Signed   By: Inez Catalina M.D.   On: 04/14/2020 23:17       LOS: 2 days   [facility] Pager on  [email or web address]  04/16/2020, 10:47 AM

## 2020-04-17 DIAGNOSIS — K8309 Other cholangitis: Secondary | ICD-10-CM | POA: Diagnosis not present

## 2020-04-17 LAB — COMPREHENSIVE METABOLIC PANEL
ALT: 151 U/L — ABNORMAL HIGH (ref 0–44)
AST: 54 U/L — ABNORMAL HIGH (ref 15–41)
Albumin: 2.5 g/dL — ABNORMAL LOW (ref 3.5–5.0)
Alkaline Phosphatase: 178 U/L — ABNORMAL HIGH (ref 38–126)
Anion gap: 9 (ref 5–15)
BUN: 22 mg/dL (ref 8–23)
CO2: 23 mmol/L (ref 22–32)
Calcium: 8.2 mg/dL — ABNORMAL LOW (ref 8.9–10.3)
Chloride: 107 mmol/L (ref 98–111)
Creatinine, Ser: 1.62 mg/dL — ABNORMAL HIGH (ref 0.61–1.24)
GFR, Estimated: 38 mL/min — ABNORMAL LOW (ref >60)
Glucose, Bld: 210 mg/dL — ABNORMAL HIGH (ref 70–99)
Potassium: 3.8 mmol/L (ref 3.5–5.1)
Sodium: 139 mmol/L (ref 135–145)
Total Bilirubin: 0.7 mg/dL (ref 0.3–1.2)
Total Protein: 5.4 g/dL — ABNORMAL LOW (ref 6.5–8.1)

## 2020-04-17 LAB — CBC
HCT: 33.9 % — ABNORMAL LOW (ref 39.0–52.0)
Hemoglobin: 10.9 g/dL — ABNORMAL LOW (ref 13.0–17.0)
MCH: 29.9 pg (ref 26.0–34.0)
MCHC: 32.2 g/dL (ref 30.0–36.0)
MCV: 93.1 fL (ref 80.0–100.0)
Platelets: 140 10*3/uL — ABNORMAL LOW (ref 150–400)
RBC: 3.64 MIL/uL — ABNORMAL LOW (ref 4.22–5.81)
RDW: 14.9 % (ref 11.5–15.5)
WBC: 6.1 10*3/uL (ref 4.0–10.5)
nRBC: 0 % (ref 0.0–0.2)

## 2020-04-17 LAB — GLUCOSE, CAPILLARY: Glucose-Capillary: 117 mg/dL — ABNORMAL HIGH (ref 70–99)

## 2020-04-17 MED ORDER — SACCHAROMYCES BOULARDII 250 MG PO CAPS
250.0000 mg | ORAL_CAPSULE | Freq: Two times a day (BID) | ORAL | Status: DC
  Administered 2020-04-17: 250 mg via ORAL
  Filled 2020-04-17: qty 1

## 2020-04-17 MED ORDER — SACCHAROMYCES BOULARDII 250 MG PO CAPS: 250.0000 mg | ORAL_CAPSULE | Freq: Two times a day (BID) | ORAL | 0 refills | Status: AC

## 2020-04-17 MED ORDER — AMOXICILLIN-POT CLAVULANATE 875-125 MG PO TABS
1.0000 | ORAL_TABLET | Freq: Two times a day (BID) | ORAL | 0 refills | Status: AC
Start: 2020-04-17 — End: 2020-04-24

## 2020-04-17 MED ORDER — METOPROLOL TARTRATE 25 MG PO TABS
25.0000 mg | ORAL_TABLET | Freq: Two times a day (BID) | ORAL | 0 refills | Status: DC
Start: 2020-04-17 — End: 2020-05-25

## 2020-04-17 NOTE — Discharge Summary (Signed)
[facility]  Physician Discharge Summary   Patient ID: Ryan Garcia MRN: [id number] DOB/AGE: [birth year] 86 y.o.  Admit date: 04/14/2020 Discharge date: 04/17/2020  PCP: Abner Greenspan, MD  DISCHARGE DIAGNOSES:  Acute cholangitis, improved Cholelithiasis Coronary artery disease Acute kidney injury, improved Right renal mass requiring outpatient follow-up Diabetes mellitus type 2, uncontrolled with hyperglycemia Hyperlipidemia Essential hypertension Normocytic anemia Obesity  RECOMMENDATIONS FOR OUTPATIENT FOLLOW UP: 1. Follow-up with cardiology and PCP 2. Renal function and LFTs to be checked at follow-up 3. Will need referral to urology for right renal mass 4. Follow-up with Dr. Loletha Carrow with GI in 2 to 3 weeks.  Will message him.   Home Health: None Equipment/Devices: None  CODE STATUS: Full code  DISCHARGE CONDITION: fair  Diet recommendation: Modified carbohydrate  INITIAL HISTORY: 86 y.o.malewith medical history significant forextensive cardiac hx, HTN, DM, HLD, CKD - severe 3v dz previously treated with PCI as he wanted to avoid cabg - recently discharged on 10/12 following left heart cathon10/12during admission for unstable angina. He wasfound to have severe 2v disease without any stents placed and is being treated medically. Hewas seen in urgent care for some right sided chest/rib pain and referred to [facility] ED.He reports havingfever and chills since he was discharged 2 days ago after the heart catheterization. He reports having some substernal chest pressure that did not radiate and was associated with shortness of breath when he would exert himself.He also reports right upper quadrant abdominal pain that is worsened with movement and deep inspiration that began the day before he was discharged. His wife reports he has not looked well and has had a slight pale and yellow appearance since he was discharged home. Reports the pain in the abdomen does  not radiate. He has not noticed any alleviating factors for the pain. He states he has not had a good appetite the last few days. He was found to have elevated liver enzymes and total bilirubin. CT of the abdomen pelvis showed gallstones and possible mild diverticulitis although he has no left-sided abdominal pain. Right upper quadrant ultrasound showed no acute cholecystitis and possible left adrenal mass will need further work-up. He reports he has been having some diarrhea for the last 2 days without blood or mucus in it.   Consultants: General surgery.  Cardiology.  Gastroenterology  Procedures: None    HOSPITAL COURSE:   Right upper quadrant abdominal pain with abnormal LFTs Patient noted to have transaminitis along with hyperbilirubinemia.  His alkaline phosphatase was also elevated.  Concern was for cholangitis.  Patient was started on Zosyn.  Patient underwent MRCP which raises concern for duodenitis.  General surgery and gastroenterology were also consulted.   Gastroenterology feels that patient may have passed a stone.  Considering clinical improvement did not recommend any ERCP or other procedures.  Antibiotics was recommended for 10 days.  LFTs have been improving.   Patient was transitioned to Augmentin.  Will be discharged on oral antibiotics today.    Acute kidney injury Presented with a creatinine of 1.91.  When he was discharged on 10/12 his creatinine was 1.45.  Likely due to hypovolemia.  Patient was hydrated.   Creatinine seems to be stable.  Was slightly high yesterday but noted to be better at 1.62.  Unclear if the patient has underlying chronic kidney disease.  Continue to hold ACE inhibitor at discharge.    Cholelithiasis MRI does not suggest cholecystitis or choledocholithiasis.    General surgery was consulted.  No plans  for surgery at this time as he will be considered high risk due to his cardiac history.  Right renal mass Will need outpatient referral to  urology.  Coronary artery disease Recently seen by cardiology.  Underwent cardiac catheterization on 10/12.  Cardiology has been following.  Patient on aspirin beta-blocker Imdur.  ACE inhibitor to be held due to elevated creatinine.  No plans for cardiac procedures at this time.    Diabetes mellitus type 2, uncontrolled with hyperglycemia HbA1c checked recently was 7.0.    Continue home medication regimen  Hyperlipidemia Statin is currently on hold due to elevated LFTs.  Essential hypertension Blood pressure is reasonably well controlled.  ACE inhibitor on hold.  Normocytic anemia/mild thrombocytopenia Monitor counts closely.  No evidence for overt bleeding.  Counts are stable.  Obesity Estimated body mass index is 34.87 kg/m as calculated from the following:   Height as of 04/11/20: 5' 11"  (1.803 m).   Weight as of 04/11/20: 113.4 kg.   Overall stable.  Okay for discharge home today.   PERTINENT LABS:  The results of significant diagnostics from this hospitalization (including imaging, microbiology, ancillary and laboratory) are listed below for reference.    Microbiology: Recent Results (from the past 240 hour(s))  Respiratory Panel by RT PCR (Flu A&B, Covid) - Nasopharyngeal Swab     Status: None   Collection Time: 04/11/20  2:30 PM   Specimen: Nasopharyngeal Swab  Result Value Ref Range Status   SARS Coronavirus 2 by RT PCR NEGATIVE NEGATIVE Final    Comment: (NOTE) SARS-CoV-2 target nucleic acids are NOT DETECTED.  The SARS-CoV-2 RNA is generally detectable in upper respiratoy specimens during the acute phase of infection. The lowest concentration of SARS-CoV-2 viral copies this assay can detect is 131 copies/mL. A negative result does not preclude SARS-Cov-2 infection and should not be used as the sole basis for treatment or other patient management decisions. A negative result may occur with  improper specimen collection/handling, submission of specimen  other than nasopharyngeal swab, presence of viral mutation(s) within the areas targeted by this assay, and inadequate number of viral copies (<131 copies/mL). A negative result must be combined with clinical observations, patient history, and epidemiological information. The expected result is Negative.  Fact Sheet for Patients:  PinkCheek.be  Fact Sheet for Healthcare Providers:  GravelBags.it  This test is no t yet approved or cleared by the [place] FDA and  has been authorized for detection and/or diagnosis of SARS-CoV-2 by FDA under an Emergency Use Authorization (EUA). This EUA will remain  in effect (meaning this test can be used) for the duration of the COVID-19 declaration under Section 564(b)(1) of the Act, 21 U.S.C. section 360bbb-3(b)(1), unless the authorization is terminated or revoked sooner.     Influenza A by PCR NEGATIVE NEGATIVE Final   Influenza B by PCR NEGATIVE NEGATIVE Final    Comment: (NOTE) The Xpert Xpress SARS-CoV-2/FLU/RSV assay is intended as an aid in  the diagnosis of influenza from Nasopharyngeal swab specimens and  should not be used as a sole basis for treatment. Nasal washings and  aspirates are unacceptable for Xpert Xpress SARS-CoV-2/FLU/RSV  testing.  Fact Sheet for Patients: PinkCheek.be  Fact Sheet for Healthcare Providers: GravelBags.it  This test is not yet approved or cleared by the [place] FDA and  has been authorized for detection and/or diagnosis of SARS-CoV-2 by  FDA under an Emergency Use Authorization (EUA). This EUA will remain  in effect (meaning this test can be  used) for the duration of the  Covid-19 declaration under Section 564(b)(1) of the Act, 21  U.S.C. section 360bbb-3(b)(1), unless the authorization is  terminated or revoked. Performed at [facility], [place] [address],  [place], [place] [address]   Respiratory Panel by RT PCR (Flu A&B, Covid) - Nasopharyngeal Swab     Status: None   Collection Time: 04/14/20  5:11 PM   Specimen: Nasopharyngeal Swab  Result Value Ref Range Status   SARS Coronavirus 2 by RT PCR NEGATIVE NEGATIVE Final    Comment: (NOTE) SARS-CoV-2 target nucleic acids are NOT DETECTED.  The SARS-CoV-2 RNA is generally detectable in upper respiratoy specimens during the acute phase of infection. The lowest concentration of SARS-CoV-2 viral copies this assay can detect is 131 copies/mL. A negative result does not preclude SARS-Cov-2 infection and should not be used as the sole basis for treatment or other patient management decisions. A negative result may occur with  improper specimen collection/handling, submission of specimen other than nasopharyngeal swab, presence of viral mutation(s) within the areas targeted by this assay, and inadequate number of viral copies (<131 copies/mL). A negative result must be combined with clinical observations, patient history, and epidemiological information. The expected result is Negative.  Fact Sheet for Patients:  PinkCheek.be  Fact Sheet for Healthcare Providers:  GravelBags.it  This test is no t yet approved or cleared by the [place] FDA and  has been authorized for detection and/or diagnosis of SARS-CoV-2 by FDA under an Emergency Use Authorization (EUA). This EUA will remain  in effect (meaning this test can be used) for the duration of the COVID-19 declaration under Section 564(b)(1) of the Act, 21 U.S.C. section 360bbb-3(b)(1), unless the authorization is terminated or revoked sooner.     Influenza A by PCR NEGATIVE NEGATIVE Final   Influenza B by PCR NEGATIVE NEGATIVE Final    Comment: (NOTE) The Xpert Xpress SARS-CoV-2/FLU/RSV assay is intended as an aid in  the diagnosis of influenza from Nasopharyngeal swab specimens and   should not be used as a sole basis for treatment. Nasal washings and  aspirates are unacceptable for Xpert Xpress SARS-CoV-2/FLU/RSV  testing.  Fact Sheet for Patients: PinkCheek.be  Fact Sheet for Healthcare Providers: GravelBags.it  This test is not yet approved or cleared by the [place] FDA and  has been authorized for detection and/or diagnosis of SARS-CoV-2 by  FDA under an Emergency Use Authorization (EUA). This EUA will remain  in effect (meaning this test can be used) for the duration of the  Covid-19 declaration under Section 564(b)(1) of the Act, 21  U.S.C. section 360bbb-3(b)(1), unless the authorization is  terminated or revoked. Performed at [facility], [place] [address], [place], [place] [address]   Blood culture (routine x 2)     Status: None (Preliminary result)   Collection Time: 04/14/20  5:11 PM   Specimen: BLOOD  Result Value Ref Range Status   Specimen Description BLOOD BLOOD RIGHT FOREARM  Final   Special Requests   Final    BOTTLES DRAWN AEROBIC AND ANAEROBIC Blood Culture adequate volume   Culture   Final    NO GROWTH 3 DAYS Performed at [facility], [place] [address], [place],  [address]    Report Status PENDING  Incomplete  Blood culture (routine x 2)     Status: None (Preliminary result)   Collection Time: 04/14/20  5:11 PM   Specimen: BLOOD  Result Value Ref Range Status  Specimen Description BLOOD RIGHT ANTECUBITAL  Final   Special Requests   Final    BOTTLES DRAWN AEROBIC AND ANAEROBIC Blood Culture adequate volume   Culture   Final    NO GROWTH 3 DAYS Performed at [facility], [address], [place], [place] [address]    Report Status PENDING  Incomplete  Urine culture     Status: Abnormal   Collection Time: 04/14/20  8:22 PM   Specimen: Urine, Random  Result Value Ref Range Status   Specimen Description URINE, RANDOM  Final   Special Requests NONE   Final   Culture (A)  Final    <10,000 COLONIES/mL INSIGNIFICANT GROWTH Performed at [facility], [place] [address], [place], [place] [address]    Report Status 04/16/2020 FINAL  Final     Labs:    Basic Metabolic Panel: Recent Labs  Lab 04/14/20 1442 04/14/20 2342 04/15/20 0857 04/16/20 0111 04/17/20 0134  NA 135 134* 136 138 139  K 3.9 3.6 3.6 3.8 3.8  CL 104 102 102 104 107  CO2 21* 22 24 25 23   GLUCOSE 351* 194* 184* 211* 210*  BUN 27* 25* 24* 27* 22  CREATININE 1.91* 1.62* 1.58* 1.75* 1.62*  CALCIUM 8.6* 8.4* 8.5* 8.3* 8.2*   Liver Function Tests: Recent Labs  Lab 04/14/20 1711 04/14/20 2342 04/15/20 0857 04/16/20 0111 04/17/20 0134  AST 131* 94* 84* 67* 54*  ALT 322* 271* 236* 192* 151*  ALKPHOS 214* 198* 194* 197* 178*  BILITOT 2.1* 1.8* 1.5* 1.3* 0.7  PROT 6.2* 5.9* 5.8* 5.4* 5.4*  ALBUMIN 3.0* 2.7* 2.6* 2.5* 2.5*   Recent Labs  Lab 04/11/20 1437 04/14/20 1711 04/16/20 0111  LIPASE 23 28 28    CBC: Recent Labs  Lab 04/11/20 1437 04/12/20 0233 04/14/20 1442 04/14/20 2342 04/15/20 0857 04/16/20 0111 04/17/20 0134  WBC 9.9   < > 5.5 5.4 5.9 6.7 6.1  NEUTROABS 6.8  --   --   --  3.6  --   --   HGB 12.2*   < > 12.8* 12.3* 12.3* 11.6* 10.9*  HCT 38.8*   < > 40.3 36.8* 37.0* 34.9* 33.9*  MCV 96.0   < > 93.7 90.9 91.1 93.3 93.1  PLT 165   < > 144* 132* 138* 147* 140*   < > = values in this interval not displayed.    CBG: Recent Labs  Lab 04/16/20 0804 04/16/20 1203 04/16/20 1732 04/16/20 2105 04/17/20 0733  GLUCAP 123* 251* 232* 242* 117*     IMAGING STUDIES DG Chest 2 View  Result Date: 04/14/2020 CLINICAL DATA:  Chest pain and fever for 1 day EXAM: CHEST - 2 VIEW COMPARISON:  04/11/2020 FINDINGS: Cardiac shadow is mildly enlarged but stable. The lungs are well aerated bilaterally. Mild atelectatic changes are again noted in the left base laterally. No sizable effusion is noted. No bony abnormality is seen. IMPRESSION: Stable  left basilar atelectasis. Electronically Signed   By: Inez Catalina M.D.   On: 04/14/2020 15:27   CT Angio Chest PE W and/or Wo Contrast  Result Date: 04/14/2020 CLINICAL DATA:  Shortness of breath with fevers and abdominal pain EXAM: CT ANGIOGRAPHY CHEST CT ABDOMEN AND PELVIS WITH CONTRAST TECHNIQUE: Multidetector CT imaging of the chest was performed using the standard protocol during bolus administration of intravenous contrast. Multiplanar CT image reconstructions and MIPs were obtained to evaluate the vascular anatomy. Multidetector CT imaging of the abdomen and pelvis was performed using the  standard protocol during bolus administration of intravenous contrast. CONTRAST:  33m OMNIPAQUE IOHEXOL 350 MG/ML SOLN COMPARISON:  None. FINDINGS: CTA CHEST FINDINGS Cardiovascular: Thoracic aorta demonstrates mild atherosclerotic calcifications. No aneurysmal dilatation is seen. No significant opacification is noted. Coronary calcifications are seen. No cardiac enlargement is noted. The pulmonary artery is well visualized with a normal branching pattern bilaterally. No filling defect to suggest pulmonary embolism is noted. Mediastinum/Nodes: Thoracic inlet is within normal limits. No sizable hilar or mediastinal adenopathy is noted. A few small calcified nodes are seen consistent with prior granulomatous disease. The esophagus is within normal limits. Lungs/Pleura: Mild dependent atelectatic changes are noted bilaterally. Some mosaic attenuation in the lungs is noted consistent with air trapping. No focal confluent infiltrate or sizable effusion is seen. No parenchymal nodules are noted. Calcified granuloma is noted in the right lower lobe. Musculoskeletal: Degenerative changes of the thoracic spine are noted. No acute rib abnormality is noted. Review of the MIP images confirms the above findings. CT ABDOMEN and PELVIS FINDINGS Hepatobiliary: Gallbladder is partially distended with a few small dependent gallstones.  No pericholecystic fluid is noted. The liver shows fatty infiltration. Pancreas: Unremarkable. No pancreatic ductal dilatation or surrounding inflammatory changes. Spleen: Normal in size without focal abnormality. Adrenals/Urinary Tract: Adrenal glands are unremarkable bilaterally. Kidneys are well visualized bilaterally with large bilateral renal cysts. The largest of these on the right measures 8.5 cm. The largest of these on the left measures 13 cm in greatest dimension. Normal enhancement and excretion is seen. Bladder is partially distended. Ureters are within normal limits. No renal calculi are seen. Stomach/Bowel: Scattered diverticular change of the colon is noted. Very mild pericolonic inflammatory changes noted in the proximal sigmoid colon consistent with early diverticulitis. No perforation or abscess formation is noted. The appendix is within normal limits. The remainder of the colon is unremarkable. Stomach is decompressed. Small bowel is unremarkable. Vascular/Lymphatic: Aortic atherosclerosis. No enlarged abdominal or pelvic lymph nodes. Reproductive: Prostate is unremarkable. Other: No abdominal wall hernia or abnormality. No abdominopelvic ascites. Musculoskeletal: Degenerative changes of lumbar spine are noted. No compression deformity is noted. Review of the MIP images confirms the above findings. IMPRESSION: CTA of the chest: No evidence of pulmonary emboli. Mild dependent atelectatic changes. Prior granulomatous disease. Aortic Atherosclerosis (ICD10-I70.0). CT of the abdomen and pelvis: Mild diverticulitis in the sigmoid without evidence of perforation or abscess formation. Cholelithiasis without complicating factors. Bilateral renal cysts as described. No obstructive changes are noted. Electronically Signed   By: MInez CatalinaM.D.   On: 04/14/2020 18:09   CT ABDOMEN PELVIS W CONTRAST  Result Date: 04/14/2020 CLINICAL DATA:  Shortness of breath with fevers and abdominal pain EXAM: CT  ANGIOGRAPHY CHEST CT ABDOMEN AND PELVIS WITH CONTRAST TECHNIQUE: Multidetector CT imaging of the chest was performed using the standard protocol during bolus administration of intravenous contrast. Multiplanar CT image reconstructions and MIPs were obtained to evaluate the vascular anatomy. Multidetector CT imaging of the abdomen and pelvis was performed using the standard protocol during bolus administration of intravenous contrast. CONTRAST:  772mOMNIPAQUE IOHEXOL 350 MG/ML SOLN COMPARISON:  None. FINDINGS: CTA CHEST FINDINGS Cardiovascular: Thoracic aorta demonstrates mild atherosclerotic calcifications. No aneurysmal dilatation is seen. No significant opacification is noted. Coronary calcifications are seen. No cardiac enlargement is noted. The pulmonary artery is well visualized with a normal branching pattern bilaterally. No filling defect to suggest pulmonary embolism is noted. Mediastinum/Nodes: Thoracic inlet is within normal limits. No sizable hilar or mediastinal adenopathy is noted. A  few small calcified nodes are seen consistent with prior granulomatous disease. The esophagus is within normal limits. Lungs/Pleura: Mild dependent atelectatic changes are noted bilaterally. Some mosaic attenuation in the lungs is noted consistent with air trapping. No focal confluent infiltrate or sizable effusion is seen. No parenchymal nodules are noted. Calcified granuloma is noted in the right lower lobe. Musculoskeletal: Degenerative changes of the thoracic spine are noted. No acute rib abnormality is noted. Review of the MIP images confirms the above findings. CT ABDOMEN and PELVIS FINDINGS Hepatobiliary: Gallbladder is partially distended with a few small dependent gallstones. No pericholecystic fluid is noted. The liver shows fatty infiltration. Pancreas: Unremarkable. No pancreatic ductal dilatation or surrounding inflammatory changes. Spleen: Normal in size without focal abnormality. Adrenals/Urinary Tract:  Adrenal glands are unremarkable bilaterally. Kidneys are well visualized bilaterally with large bilateral renal cysts. The largest of these on the right measures 8.5 cm. The largest of these on the left measures 13 cm in greatest dimension. Normal enhancement and excretion is seen. Bladder is partially distended. Ureters are within normal limits. No renal calculi are seen. Stomach/Bowel: Scattered diverticular change of the colon is noted. Very mild pericolonic inflammatory changes noted in the proximal sigmoid colon consistent with early diverticulitis. No perforation or abscess formation is noted. The appendix is within normal limits. The remainder of the colon is unremarkable. Stomach is decompressed. Small bowel is unremarkable. Vascular/Lymphatic: Aortic atherosclerosis. No enlarged abdominal or pelvic lymph nodes. Reproductive: Prostate is unremarkable. Other: No abdominal wall hernia or abnormality. No abdominopelvic ascites. Musculoskeletal: Degenerative changes of lumbar spine are noted. No compression deformity is noted. Review of the MIP images confirms the above findings. IMPRESSION: CTA of the chest: No evidence of pulmonary emboli. Mild dependent atelectatic changes. Prior granulomatous disease. Aortic Atherosclerosis (ICD10-I70.0). CT of the abdomen and pelvis: Mild diverticulitis in the sigmoid without evidence of perforation or abscess formation. Cholelithiasis without complicating factors. Bilateral renal cysts as described. No obstructive changes are noted. Electronically Signed   By: Inez Catalina M.D.   On: 04/14/2020 18:09   US Abdomen Limited  Result Date: 04/14/2020 CLINICAL DATA:  Cholelithiasis. EXAM: ULTRASOUND ABDOMEN LIMITED RIGHT UPPER QUADRANT COMPARISON:  CT abdomen and pelvis 04/14/2020 FINDINGS: Gallbladder: Gallstones measuring up to 7 mm. No gallbladder wall thickening. No sonographic Murphy sign noted by sonographer. Common bile duct: Diameter: 3 mm Liver: Diffusely increased  parenchymal echogenicity without a focal lesion identified. Portal vein is patent on color Doppler imaging with normal direction of blood flow towards the liver. Other: Incidental 2.4 cm complex, partially hyperechoic mass in the upper pole of the right kidney which appears solid with some blood flow on color Doppler imaging. IMPRESSION: 1. Cholelithiasis without evidence of cholecystitis. 2. 2.4 cm right upper pole renal mass highly suspicious for neoplasm. Consider nonemergent abdominal MRI without and with contrast (preferably as an outpatient) for further evaluation. 3. Echogenic liver suggestive of steatosis. Electronically Signed   By: Logan Bores M.D.   On: 04/14/2020 20:33   MR ABDOMEN MRCP W WO CONTAST  Result Date: 04/14/2020 CLINICAL DATA:  Cholelithiasis EXAM: MRI ABDOMEN WITHOUT AND WITH CONTRAST (INCLUDING MRCP) TECHNIQUE: Multiplanar multisequence MR imaging of the abdomen was performed both before and after the administration of intravenous contrast. Heavily T2-weighted images of the biliary and pancreatic ducts were obtained, and three-dimensional MRCP images were rendered by post processing. CONTRAST:  9.54m GADAVIST GADOBUTROL 1 MMOL/ML IV SOLN COMPARISON:  CT and ultrasound from earlier in the same day. FINDINGS: Lower chest: No  focal infiltrate or sizable effusion is noted. Hepatobiliary: Tiny cyst is noted along the anterior margin of the right lobe of the liver measuring less than 1 cm better visualized than on the prior CT examination. Hypointense area is noted in the tip of the right lobe of the liver better visualized than on prior CT which demonstrates some peripheral enhancement and filling in on delayed post gadolinium images consistent with a small hemangioma. This measures approximate with 1.4 cm in dimension. No other areas of abnormal enhancement are noted within the liver. Gallbladder is well visualized and demonstrates dependent cholelithiasis similar to that seen on prior CT  and ultrasound. The biliary tree is within normal limits. No findings to suggest choledocholithiasis are seen. Pancreas:  Pancreas is well visualized and within normal limits. Spleen:  Within normal limits in size and appearance. Adrenals/Urinary Tract: Adrenal glands are within normal limits. Multiple renal cysts are again identified similar to that seen on prior CT examination. Hypointense lesion is noted in the upper pole of right kidney measuring approximately 2.6 cm. Following contrast administration there is irregular enhancement identified consistent with a renal cell neoplasm. No obstructive changes are seen. Bladder is not visualized on these images. Stomach/Bowel: There is some mild inflammatory change surrounding the second portion the duodenum better visualized than on the prior CT examination. This is consistent with duodenitis. No findings to suggest perforation are noted. Small duodenal diverticulum is noted with fluid within adjacent to the ampulla of Vater. The remainder of the visualized small bowel appears within normal limits. Visualized colon is unremarkable. The previously seen inflammatory changes in the sigmoid are not evaluated on this exam. Vascular/Lymphatic: No aneurysmal dilatation is seen. No significant lymphadenopathy is noted. Other:  No free fluid is noted. Musculoskeletal: Visualized bony structures show no acute abnormality. IMPRESSION: Changes consistent with cholelithiasis. No choledocholithiasis is seen. No gallbladder inflammatory changes noted. Thickening and mild inflammatory changes in the second portion the duodenum consistent with duodenitis. A small duodenal diverticulum is identified with fluid within. No definitive perforation is seen. This may contribute to the patient's elevated LFTs although no biliary ductal dilatation is seen. 2.6 cm enhancing mass lesion in the upper pole of the right kidney as described consistent with renal neoplasm. Small hepatic cyst and  small hepatic hemangioma. Electronically Signed   By: Inez Catalina M.D.   On: 04/14/2020 23:17     DISCHARGE EXAMINATION: Vitals:   04/16/20 1731 04/16/20 2104 04/17/20 0438 04/17/20 1008  BP: 124/70 130/61 136/68 (!) 141/64  Pulse: 82 70 76 68  Resp:  18 18   Temp: 98.2 F (36.8 C) 98.3 F (36.8 C) 98 F (36.7 C)   TempSrc: Oral  Oral   SpO2: 98% 98% 99%    General appearance: Awake alert.  In no distress Resp: Clear to auscultation bilaterally.  Normal effort Cardio: S1-S2 is normal regular.  No S3-S4.  No rubs murmurs or bruit GI: Abdomen is soft.  Nontender nondistended.  Bowel sounds are present normal.  No masses organomegaly    DISPOSITION: Home  Discharge Instructions    Call MD for:  difficulty breathing, headache or visual disturbances   Complete by: As directed    Call MD for:  extreme fatigue   Complete by: As directed    Call MD for:  persistant dizziness or light-headedness   Complete by: As directed    Call MD for:  persistant nausea and vomiting   Complete by: As directed    Call MD  for:  severe uncontrolled pain   Complete by: As directed    Call MD for:  temperature >100.4   Complete by: As directed    Diet - low sodium heart healthy   Complete by: As directed    Diet Carb Modified   Complete by: As directed    Discharge instructions   Complete by: As directed    Please keep your appointments with your cardiologist and your primary care provider.  Your PCP can refer you to a urologist for your kidney mass.  You will need to have blood work to check your kidney function in the next week or so.  This can be done at your PCP appointment.  Please continue to hydrate yourself.  Your lisinopril and hydrochlorothiazide has been held due to elevated creatinine (test of kidney function).  Once this gets better your medications can be resumed.  You were cared for by a hospitalist during your hospital stay. If you have any questions about your discharge  medications or the care you received while you were in the hospital after you are discharged, you can call the unit and asked to speak with the hospitalist on call if the hospitalist that took care of you is not available. Once you are discharged, your primary care physician will handle any further medical issues. Please note that NO REFILLS for any discharge medications will be authorized once you are discharged, as it is imperative that you return to your primary care physician (or establish a relationship with a primary care physician if you do not have one) for your aftercare needs so that they can reassess your need for medications and monitor your lab values. If you do not have a primary care physician, you can call [phone] for a physician referral.   Increase activity slowly   Complete by: As directed         Allergies as of 04/17/2020      Reactions   Loratadine    Not effective   Simvastatin    Intolerant, joint pain      Medication List    STOP taking these medications   hydrochlorothiazide 12.5 MG tablet Commonly known as: HYDRODIURIL   lisinopril 20 MG tablet Commonly known as: ZESTRIL     TAKE these medications   acetaminophen 325 MG tablet Commonly known as: TYLENOL Take 650 mg by mouth every 6 (six) hours as needed for mild pain or headache.   amoxicillin-clavulanate 875-125 MG tablet Commonly known as: AUGMENTIN Take 1 tablet by mouth every 12 (twelve) hours for 7 days.   aspirin 81 MG tablet Take 81 mg by mouth daily.   cetirizine 10 MG tablet Commonly known as: ZYRTEC TAKE 1 TABLET EVERY DAY What changed:   when to take this  reasons to take this   CO Q 10 PO Take 200 mg by mouth daily.   Droplet Pen Needles 31G X 6 MM Misc Generic drug: Insulin Pen Needle USE ONE TIME DAILY  AT  BEDTIME  WITH  LANTUS   Flonase 50 MCG/ACT nasal spray Generic drug: fluticasone Place 1 spray into both nostrils daily as needed for allergies.   glipiZIDE 10 MG  tablet Commonly known as: GLUCOTROL TAKE 1 TABLET TWICE DAILY WITH MEALS What changed: when to take this   isosorbide mononitrate 30 MG 24 hr tablet Commonly known as: IMDUR Take 1 tablet (30 mg total) by mouth daily.   Lantus SoloStar 100 UNIT/ML Solostar Pen Generic drug: insulin glargine INJECT  60 UNITS SUBCUTANEOUSLY AT BEDTIME What changed:   how much to take  how to take this  when to take this  additional instructions   metFORMIN 1000 MG tablet Commonly known as: GLUCOPHAGE TAKE 1 TABLET TWICE DAILY What changed: when to take this   metoprolol tartrate 25 MG tablet Commonly known as: LOPRESSOR Take 1 tablet (25 mg total) by mouth 2 (two) times daily. What changed: how much to take   nitroGLYCERIN 0.4 MG SL tablet Commonly known as: Nitrostat Place 1 tablet (0.4 mg total) under the tongue every 5 (five) minutes as needed for chest pain. What changed: reasons to take this   rosuvastatin 40 MG tablet Commonly known as: CRESTOR Take 40 mg by mouth daily.   saccharomyces boulardii 250 MG capsule Commonly known as: FLORASTOR Take 1 capsule (250 mg total) by mouth 2 (two) times daily for 14 days.   True Metrix Air Glucose Meter w/Device Kit 1 kit by Other route 2 (two) times daily. Check blood sugar twice daily and as directed. Dx E11.65   True Metrix Blood Glucose Test test strip Generic drug: glucose blood CHECK BLOOD SUGAR TWICE DAILY   TRUEplus Lancets 30G Misc Check blood sugar twice daily and as directed. Dx E11.65   Vitamin B12 1000 MCG Tbcr Take 1,000 mcg by mouth daily.         Follow-up Information    Lelon Perla, MD Follow up on 04/18/2020.   Specialty: Cardiology Why: on 04/18/20 at [facility] information: [address] [place] [address] [phone]        Abner Greenspan, MD Follow up on 04/26/2020.   Specialties: Family Medicine, Radiology Why: on 04/26/20 at [facility]. Please ask about referring you to  Urology for the kidney mass Contact information: [place] [address] [phone]               TOTAL DISCHARGE TIME: 35 minutes  [place]  [facility] Pager on [email or web address]  04/17/2020, 11:21 AM

## 2020-04-17 NOTE — Discharge Instructions (Signed)
Cholangitis  Cholangitis is inflammation of the group of tubes (ducts) that carry digestive juices from the liver, gallbladder, and pancreas to the small intestine. This group of ducts is called the biliary tract. Cholangitis can cause fever, abdominal pain, and yellowish discoloration of the skin, the whites of the eyes, and mucous membranes (jaundice). Cholangitis can get worse very quickly and cause infection throughout the body (sepsis). It is important to diagnose and treat cholangitis as soon as possible. What are the causes? This condition is usually caused by a blockage (obstruction) in the biliary tract. The most common causes of obstruction are:  Formation of hard particles (stones) in the biliary tract.  Damage to the biliary tract from a previous surgical or diagnostic procedure. Other causes of an obstruction include:  Cysts or tumors in the biliary tract.  A type of liver disease that affects the biliary tract (primary sclerosing cholangitis).  Being born with a narrow biliary tract. When the flow of digestive juices is blocked, bacteria that normally live in the intestine can grow and spread inside the biliary tract. What increases the risk? The following factors may make you more likely to develop this condition:  Being 84?84 years old.  Having a history of stones in the biliary tract.  Having had cholangitis in the past.  Having HIV.  Having another condition that affects the biliary tract.  Having had a procedure to diagnose or treat problems with the biliary tract, especially endoscopic retrograde cholangiopancreatography (ERCP). These types of procedures may cause scarring and obstruction that can lead to infection. What are the signs or symptoms? The most common symptoms of this condition are fever, abdominal pain, and jaundice. Often, all of these symptoms are present. Other symptoms may include:  Chills.  Tiredness.  Nausea.  Dark-colored  urine.  Clay-colored stools.  Confusion.  Itchy skin. How is this diagnosed? This condition may be diagnosed based on:  Your symptoms.  A physical exam.  Your medical history. Your health care provider may ask whether you have had stones, ERCP, or other procedures involving the biliary tract in the past.  Blood tests.  Imaging studies, such as: ? An ultrasound. This uses sound waves to make an image of any obstructions that you have. ? An MRI. ? A CT scan.  ERCP to check the biliary tract for possible causes of cholangitis. During ERCP, a thin, lighted tube (endoscope) is passed through your mouth and down your throat into the first part of your small intestine (duodenum). A small, plastic tube (cannula) is then passed through the endoscope and directed into your bile duct or pancreatic duct. Dye is then injected through the cannula and X-rays are taken. How is this treated? This condition is usually treated at a hospital. Treatment may include:  Receiving fluids, nutrition, and antibiotic medicines through an IV line. You may be given antibiotics that kill most of the bacteria known to cause cholangitis (broad spectrumantibiotics).  ERCP or another surgical procedure to open and drain the biliary tract. Follow these instructions at home: Medicines  Take over-the-counter and prescription medicines only as told by your health care provider.  Take your antibiotic medicine as told by your health care provider. Do not stop taking the antibiotic even if you start to feel better. General instructions  Follow instructions from your health care provider about eating or drinking restrictions.  Maintain a healthy weight.  Keep all follow-up visits as told by your health care provider. This is important. Activity  Exercise  regularly, as told by your health care provider.  Return to your normal activities as told by your health care provider. Ask your health care provider what  activities are safe for you. Contact a health care provider if you:  Have symptoms that return or become more severe.  Suddenly lose weight. Get help right away if you:  Have a fever.  Have chills.  Have severe abdominal pain.  Feel dizzy or lightheaded. Summary  Cholangitis is inflammation of the group of tubes (ducts) that carry digestive juices from the liver, gallbladder, and pancreas to the small intestine.  This condition is usually caused by a blockage (obstruction) in the biliary tract.  The most common symptoms of this condition are fever, abdominal pain, and jaundice.  This condition is usually treated at a hospital. This information is not intended to replace advice given to you by your health care provider. Make sure you discuss any questions you have with your health care provider. Document Revised: 02/20/2018 Document Reviewed: 02/20/2018 Elsevier Patient Education  2020 [employer].

## 2020-04-17 NOTE — Progress Notes (Signed)
Patient ID: Ryan Garcia, male   DOB: 11/14/1933, 86 y.o.   MRN: [id number] Sitting up and eating breakfast.    We discussed again the need to have the conversation with Dr. Stanford Breed about possible revasculartion (CABG) and then subsequently looking at the kidney neoplasm and the gallstones.   He is slated to go home today.    Kaylyn Lim, MD, FACS

## 2020-04-17 NOTE — Plan of Care (Signed)
Patient discharged to go home today

## 2020-04-17 NOTE — Progress Notes (Signed)
Nsg Discharge Note  Admit Date:  04/14/2020 Discharge date: 04/17/2020   Ryan Garcia to be D/C'd Patient/caregiver able to verbalize understanding.  Discharge Medication: Allergies as of 04/17/2020      Reactions   Loratadine    Not effective   Simvastatin    Intolerant, joint pain      Medication List    STOP taking these medications   hydrochlorothiazide 12.5 MG tablet Commonly known as: HYDRODIURIL   lisinopril 20 MG tablet Commonly known as: ZESTRIL     TAKE these medications   acetaminophen 325 MG tablet Commonly known as: TYLENOL Take 650 mg by mouth every 6 (six) hours as needed for mild pain or headache.   amoxicillin-clavulanate 875-125 MG tablet Commonly known as: AUGMENTIN Take 1 tablet by mouth every 12 (twelve) hours for 7 days.   aspirin 81 MG tablet Take 81 mg by mouth daily.   cetirizine 10 MG tablet Commonly known as: ZYRTEC TAKE 1 TABLET EVERY DAY What changed:   when to take this  reasons to take this   CO Q 10 PO Take 200 mg by mouth daily.   Droplet Pen Needles 31G X 6 MM Misc Generic drug: Insulin Pen Needle USE ONE TIME DAILY  AT  BEDTIME  WITH  LANTUS   Flonase 50 MCG/ACT nasal spray Generic drug: fluticasone Place 1 spray into both nostrils daily as needed for allergies.   glipiZIDE 10 MG tablet Commonly known as: GLUCOTROL TAKE 1 TABLET TWICE DAILY WITH MEALS What changed: when to take this   isosorbide mononitrate 30 MG 24 hr tablet Commonly known as: IMDUR Take 1 tablet (30 mg total) by mouth daily.   Lantus SoloStar 100 UNIT/ML Solostar Pen Generic drug: insulin glargine INJECT 60 UNITS SUBCUTANEOUSLY AT BEDTIME What changed:   how much to take  how to take this  when to take this  additional instructions   metFORMIN 1000 MG tablet Commonly known as: GLUCOPHAGE TAKE 1 TABLET TWICE DAILY What changed: when to take this   metoprolol tartrate 25 MG tablet Commonly known as: LOPRESSOR Take 1 tablet (25  mg total) by mouth 2 (two) times daily. What changed: how much to take   nitroGLYCERIN 0.4 MG SL tablet Commonly known as: Nitrostat Place 1 tablet (0.4 mg total) under the tongue every 5 (five) minutes as needed for chest pain. What changed: reasons to take this   rosuvastatin 40 MG tablet Commonly known as: CRESTOR Take 40 mg by mouth daily.   saccharomyces boulardii 250 MG capsule Commonly known as: FLORASTOR Take 1 capsule (250 mg total) by mouth 2 (two) times daily for 14 days.   True Metrix Air Glucose Meter w/Device Kit 1 kit by Other route 2 (two) times daily. Check blood sugar twice daily and as directed. Dx E11.65   True Metrix Blood Glucose Test test strip Generic drug: glucose blood CHECK BLOOD SUGAR TWICE DAILY   TRUEplus Lancets 30G Misc Check blood sugar twice daily and as directed. Dx E11.65   Vitamin B12 1000 MCG Tbcr Take 1,000 mcg by mouth daily.       Discharge Assessment: Vitals:   04/17/20 0438 04/17/20 1008  BP: 136/68 (!) 141/64  Pulse: 76 68  Resp: 18   Temp: 98 F (36.7 C)   SpO2: 99%    Skin clean, dry and intact without evidence of skin break down, no evidence of skin tears noted. IV catheter discontinued intact. Site without signs and symptoms of complications -  no redness or edema noted at insertion site, patient denies c/o pain - only slight tenderness at site.  Dressing with slight pressure applied.  D/c Instructions-Education: Discharge instructions given to patient/family with verbalized understanding. D/c education completed with patient/family including follow up instructions, medication list, d/c activities limitations if indicated, with other d/c instructions as indicated by MD - patient able to verbalize understanding, all questions fully answered. Patient instructed to return to ED, call 911, or call MD for any changes in condition.  Patient escorted via [place], and D/C home via private auto.  Tiasha Helvie, Jolene Schimke,  RN 04/17/2020 12:25 PM

## 2020-04-18 ENCOUNTER — Telehealth: Payer: Self-pay

## 2020-04-18 ENCOUNTER — Telehealth: Payer: Self-pay | Admitting: Family Medicine

## 2020-04-18 ENCOUNTER — Encounter: Payer: Self-pay | Admitting: Cardiology

## 2020-04-18 ENCOUNTER — Ambulatory Visit: Payer: Medicare HMO | Admitting: Cardiology

## 2020-04-18 ENCOUNTER — Other Ambulatory Visit: Payer: Self-pay

## 2020-04-18 VITALS — BP 142/60 | HR 71 | Ht 71.5 in | Wt 249.6 lb

## 2020-04-18 DIAGNOSIS — R0602 Shortness of breath: Secondary | ICD-10-CM

## 2020-04-18 DIAGNOSIS — E7800 Pure hypercholesterolemia, unspecified: Secondary | ICD-10-CM | POA: Diagnosis not present

## 2020-04-18 DIAGNOSIS — I10 Essential (primary) hypertension: Secondary | ICD-10-CM

## 2020-04-18 DIAGNOSIS — I2510 Atherosclerotic heart disease of native coronary artery without angina pectoris: Secondary | ICD-10-CM | POA: Diagnosis not present

## 2020-04-18 DIAGNOSIS — N2889 Other specified disorders of kidney and ureter: Secondary | ICD-10-CM | POA: Insufficient documentation

## 2020-04-18 MED ORDER — ISOSORBIDE MONONITRATE ER 60 MG PO TB24
60.0000 mg | ORAL_TABLET | Freq: Every day | ORAL | 3 refills | Status: DC
Start: 2020-04-18 — End: 2021-02-02

## 2020-04-18 NOTE — Telephone Encounter (Signed)
Noted. Referral is in que ready to be processed.

## 2020-04-18 NOTE — Telephone Encounter (Signed)
Pt's wife called and says his cardiologist says he needs a urologist appt ASAP b/c they have found cancer on his right kidney.  He would like to go to [facility] or wherever you think is best to go.  He has an appt w/you on Wed. But she wanted referral started.  Thank you!

## 2020-04-18 NOTE — Telephone Encounter (Signed)
Transition Care Management Follow-up Telephone Call  Date of discharge and from where: 04/17/2020, Ryan Garcia  How have you been since you were released from the hospital? Patient is doing better since getting out of the hospital.  Any questions or concerns? No  Items Reviewed:  Did the pt receive and understand the discharge instructions provided? Yes   Medications obtained and verified? Yes   Other? No   Any new allergies since your discharge? No   Dietary orders reviewed? Yes  Do you have support at home? Yes   Home Care and Equipment/Supplies: Were home health services ordered? no If so, what is the name of the agency? N/A  Has the agency set up a time to come to the patient's home? not applicable Were any new equipment or medical supplies ordered?  No What is the name of the medical supply agency? N/A Were you able to get the supplies/equipment? not applicable Do you have any questions related to the use of the equipment or supplies? No  Functional Questionnaire: (I = Independent and D = Dependent) ADLs: I  Bathing/Dressing- I  Meal Prep- I  Eating- I  Maintaining continence- I  Transferring/Ambulation- I  Managing Meds- I  Follow up appointments reviewed:   PCP Hospital f/u appt confirmed? Yes  Scheduled to see Dr. Glori Bickers on 04/20/2020 @ 11:30 am.  [facility] f/u appt confirmed? Yes  Scheduled to see Dr. Stanford Breed today   Are transportation arrangements needed? No   If their condition worsens, is the pt aware to call PCP or go to the Emergency Dept.? Yes  Was the patient provided with contact information for the PCP's office or ED? Yes  Was to pt encouraged to call back with questions or concerns? Yes

## 2020-04-18 NOTE — Patient Instructions (Signed)
Medication Instructions:   INCREASE ISOSORBIDE TO 60 MG ONCE DAILY= 2 OF THE 30 MG TABLETS ONCE DAILY  *If you need a refill on your cardiac medications before your next appointment, please call your pharmacy*   Follow-Up: At [facility], you and your health needs are our priority.  As part of our continuing mission to provide you with exceptional heart care, we have created designated Provider Care Teams.  These Care Teams include your primary Cardiologist (physician) and Advanced Practice Providers (APPs -  Physician Assistants and Nurse Practitioners) who all work together to provide you with the care you need, when you need it.  We recommend signing up for the patient portal called "MyChart".  Sign up information is provided on this After Visit Summary.  MyChart is used to connect with patients for Virtual Visits (Telemedicine).  Patients are able to view lab/test results, encounter notes, upcoming appointments, etc.  Non-urgent messages can be sent to your provider as well.   To learn more about what you can do with MyChart, go to [email or web address].    Your next appointment:   3 month(s)  The format for your next appointment:   In Person  Provider:   Kirk Ruths, MD

## 2020-04-18 NOTE — Telephone Encounter (Signed)
I placed an urgent referral  Will route to pcc

## 2020-04-19 LAB — CULTURE, BLOOD (ROUTINE X 2)
Culture: NO GROWTH
Culture: NO GROWTH
Special Requests: ADEQUATE
Special Requests: ADEQUATE

## 2020-04-20 ENCOUNTER — Other Ambulatory Visit: Payer: Self-pay

## 2020-04-20 ENCOUNTER — Ambulatory Visit (INDEPENDENT_AMBULATORY_CARE_PROVIDER_SITE_OTHER): Payer: Medicare HMO | Admitting: Family Medicine

## 2020-04-20 ENCOUNTER — Encounter: Payer: Self-pay | Admitting: Family Medicine

## 2020-04-20 VITALS — BP 126/68 | HR 59 | Temp 97.7°F | Ht 71.5 in | Wt 251.0 lb

## 2020-04-20 DIAGNOSIS — I10 Essential (primary) hypertension: Secondary | ICD-10-CM

## 2020-04-20 DIAGNOSIS — D649 Anemia, unspecified: Secondary | ICD-10-CM | POA: Diagnosis not present

## 2020-04-20 DIAGNOSIS — N2889 Other specified disorders of kidney and ureter: Secondary | ICD-10-CM

## 2020-04-20 DIAGNOSIS — N179 Acute kidney failure, unspecified: Secondary | ICD-10-CM

## 2020-04-20 DIAGNOSIS — K8309 Other cholangitis: Secondary | ICD-10-CM | POA: Diagnosis not present

## 2020-04-20 DIAGNOSIS — Z23 Encounter for immunization: Secondary | ICD-10-CM | POA: Diagnosis not present

## 2020-04-20 DIAGNOSIS — E1165 Type 2 diabetes mellitus with hyperglycemia: Secondary | ICD-10-CM

## 2020-04-20 LAB — HEPATIC FUNCTION PANEL
ALT: 98 U/L — ABNORMAL HIGH (ref 0–53)
AST: 35 U/L (ref 0–37)
Albumin: 3.5 g/dL (ref 3.5–5.2)
Alkaline Phosphatase: 173 U/L — ABNORMAL HIGH (ref 39–117)
Bilirubin, Direct: 0.3 mg/dL (ref 0.0–0.3)
Total Bilirubin: 0.8 mg/dL (ref 0.2–1.2)
Total Protein: 6.1 g/dL (ref 6.0–8.3)

## 2020-04-20 LAB — CBC WITH DIFFERENTIAL/PLATELET
Basophils Absolute: 0 10*3/uL (ref 0.0–0.1)
Basophils Relative: 0.3 % (ref 0.0–3.0)
Eosinophils Absolute: 0.3 10*3/uL (ref 0.0–0.7)
Eosinophils Relative: 4.1 % (ref 0.0–5.0)
HCT: 35.2 % — ABNORMAL LOW (ref 39.0–52.0)
Hemoglobin: 11.8 g/dL — ABNORMAL LOW (ref 13.0–17.0)
Lymphocytes Relative: 21.2 % (ref 12.0–46.0)
Lymphs Abs: 1.3 10*3/uL (ref 0.7–4.0)
MCHC: 33.5 g/dL (ref 30.0–36.0)
MCV: 90.4 fl (ref 78.0–100.0)
Monocytes Absolute: 0.5 10*3/uL (ref 0.1–1.0)
Monocytes Relative: 8.7 % (ref 3.0–12.0)
Neutro Abs: 4 10*3/uL (ref 1.4–7.7)
Neutrophils Relative %: 65.7 % (ref 43.0–77.0)
Platelets: 230 10*3/uL (ref 150.0–400.0)
RBC: 3.89 Mil/uL — ABNORMAL LOW (ref 4.22–5.81)
RDW: 15.6 % — ABNORMAL HIGH (ref 11.5–15.5)
WBC: 6.1 10*3/uL (ref 4.0–10.5)

## 2020-04-20 LAB — RENAL FUNCTION PANEL
Albumin: 3.5 g/dL (ref 3.5–5.2)
BUN: 17 mg/dL (ref 6–23)
CO2: 27 mEq/L (ref 19–32)
Calcium: 8.6 mg/dL (ref 8.4–10.5)
Chloride: 106 mEq/L (ref 96–112)
Creatinine, Ser: 1.16 mg/dL (ref 0.40–1.50)
GFR: 57.44 mL/min — ABNORMAL LOW (ref >60.00)
Glucose, Bld: 196 mg/dL — ABNORMAL HIGH (ref 70–99)
Phosphorus: 3 mg/dL (ref 2.3–4.6)
Potassium: 4.1 mEq/L (ref 3.5–5.1)
Sodium: 138 mEq/L (ref 135–145)

## 2020-04-20 LAB — IRON: Iron: 57 ug/dL (ref 42–165)

## 2020-04-20 MED ORDER — LANTUS SOLOSTAR 100 UNIT/ML ~~LOC~~ SOPN
50.0000 [IU] | PEN_INJECTOR | Freq: Every day | SUBCUTANEOUS | 0 refills | Status: DC
Start: 2020-04-20 — End: 2020-05-23

## 2020-04-20 NOTE — Assessment & Plan Note (Signed)
Pt states am glucose readings are often low in 60s Diet is better Advised him to cut lantus from 60 to 50 u in pm  If no imp may need to reduce glipizide or add bedtime snack Lab Results  Component Value Date   HGBA1C 7.0 (H) 04/11/2020

## 2020-04-20 NOTE — Patient Instructions (Addendum)
Go down on lantus to 50 units   Stay off the the BP medicine on hold and keep monitoring BP  We will check labs today for blood count and kidney and liver   The office will call you to set up a GI visit for the cholangitis  If abdominal pain comes back   Flu shot today

## 2020-04-20 NOTE — Assessment & Plan Note (Signed)
Reviewed hospital records, lab results and studies in detail  abd pain and fever are resolved after hosp, abx tx (still on augmentin) ad MRCP Passed cbd stone is suspected  Repeat lft today  Ref to GI to f/u with Dr Loletha Carrow as noted in discharge summary Enc to eat lower fat diet  inst to call if any symptoms return

## 2020-04-20 NOTE — Progress Notes (Signed)
Subjective:    Patient ID: Ryan Garcia, male    DOB: [birth year], 86 y.o.   MRN: [id number]  This visit occurred during the SARS-CoV-2 public health emergency.  Safety protocols were in place, including screening questions prior to the visit, additional usage of staff PPE, and extensive cleaning of exam room while observing appropriate contact time as indicated for disinfecting solutions.    HPI Pt presents for f/u of hospitalization   Wt Readings from Last 3 Encounters:  04/20/20 251 lb (113.9 kg)  04/18/20 249 lb 9.6 oz (113.2 kg)  04/11/20 250 lb (113.4 kg)   34.52 kg/m  hosp from 10/4 to 04/17/20 for acute cholangitis  Symptoms started with fever and chills shortly after a heart cath    HOSPITAL COURSE:   Right upper quadrant abdominal pain with abnormal LFTs Patient noted to have transaminitis along with hyperbilirubinemia. His alkaline phosphatase was also elevated. Concern was for cholangitis. Patient was started on Zosyn. Patient underwent MRCP which raises concern for duodenitis. General surgery and gastroenterology were also consulted.  Gastroenterology feels that patient may have passed a stone. Considering clinical improvement did not recommend any ERCP or other procedures. Antibiotics was recommended for 10 days.  LFTs have been improving.  Patient was transitioned to Augmentin.  Will be discharged on oral antibiotics today.    Still taking augmentin  Tolerating it well  No more abd pain  No fever  Appetite is down a bit  bm are firming up a little   Acute kidney injury Presented with a creatinine of 1.91. When he was discharged on 10/12 his creatinine was 1.45. Likely due to hypovolemia. Patient was hydrated.  Creatinine seems to be stable.  Was slightly high yesterday but noted to be better at 1.62.  Unclear if the patient has underlying chronic kidney disease.  Continue to hold ACE inhibitor at discharge.    drining a lot of fluids    Cholelithiasis MRI does not suggest cholecystitis or choledocholithiasis.   General surgery was consulted.No plans for surgery at this time as he will be considered high risk due to his cardiac history.  Right renal mass Will need outpatient referral to urology.  This is planned for Monday    Coronary artery disease Recently seen by cardiology. Underwent cardiac catheterization on 10/12. Cardiology has been following. Patient on aspirin beta-blocker Imdur. ACE inhibitor to be held due to elevated creatinine. No plans for cardiac procedures at this time.   Has had f/u with cardiology and doing better overall  No chest pain   Diabetes mellitus type 2, uncontrolled with hyperglycemia HbA1c checked recently was 7.0.  Continue home medication regimen  Per pt -glucose has been getting too low in am  60s often  Wants to decrease lantus to 50 u  Perhaps eating less   Hyperlipidemia Statin is currently on hold due to elevated LFTs.   Will see how they are today    Essential hypertension Blood pressure is reasonably well controlled.  ACE inhibitor on hold. BP: 126/68    Normocytic anemia/mild thrombocytopenia Monitor counts closely. No evidence for overt bleeding.Counts are stable. Will re check this today   Obesity Estimated body mass index is 34.87 kg/m as calculated from the following: Height as of 04/11/20: _0  (1.803 m). Weight as of 04/11/20: 113.4 kg.  Had a neg covid test and neg urine cx and blood cx   Lab Results  Component Value Date   WBC 6.1 04/17/2020   HGB  10.9 (L) 04/17/2020   HCT 33.9 (L) 04/17/2020   MCV 93.1 04/17/2020   PLT 140 (L) 04/17/2020   Lab Results  Component Value Date   CREATININE 1.62 (H) 04/17/2020   BUN 22 04/17/2020   NA 139 04/17/2020   K 3.8 04/17/2020   CL 107 04/17/2020   CO2 23 04/17/2020   Lab Results  Component Value Date   ALT 151 (H) 04/17/2020   AST 54 (H) 04/17/2020   ALKPHOS 178 (H)  04/17/2020   BILITOT 0.7 04/17/2020    MR abd/mrcp IMPRESSION: Changes consistent with cholelithiasis. No choledocholithiasis is seen. No gallbladder inflammatory changes noted. Thickening and mild inflammatory changes in the second portion the duodenum consistent with duodenitis. A small duodenal diverticulum is identified with fluid within. No definitive perforation is seen. This may contribute to the patient's elevated LFTs although no biliary ductal dilatation is seen. 2.6 cm enhancing mass lesion in the upper pole of the right kidney as described consistent with renal neoplasm. Small hepatic cyst and small hepatic hemangioma. Electronically Signed   By: Inez Catalina M.D.   On: 04/14/2020   Ultrasound IMPRESSION: 1. Cholelithiasis without evidence of cholecystitis. 2. 2.4 cm right upper pole renal mass highly suspicious for neoplasm. Consider nonemergent abdominal MRI without and with contrast (preferably as an outpatient) for further evaluation. 3. Echogenic liver suggestive of steatosis. Electronically Signed   By: Logan Bores M.D.   On: 04/14/2020 20:33  CTA chest IMPRESSION: CTA of the chest: No evidence of pulmonary emboli. Mild dependent atelectatic changes. Prior granulomatous disease. Aortic Atherosclerosis (ICD10-I70.0). CT of the abdomen and pelvis: Mild diverticulitis in the sigmoid without evidence of perforation or abscess formation. Cholelithiasis without complicating factors. Bilateral renal cysts as described. No obstructive changes are noted. Electronically Signed   By: Inez Catalina M.D.   On: 04/14/2020 18:09   CT abd/pelvis CT ABDOMEN and PELVIS FINDINGS Hepatobiliary: Gallbladder is partially distended with a few small dependent gallstones. No pericholecystic fluid is noted. The liver shows fatty infiltration. Pancreas: Unremarkable. No pancreatic ductal dilatation or surrounding inflammatory changes. Spleen: Normal in size without focal abnormality. Adrenals/Urinary Tract:  Adrenal glands are unremarkable bilaterally. Kidneys are well visualized bilaterally with large bilateral renal cysts. The largest of these on the right measures 8.5 cm. The largest of these on the left measures 13 cm in greatest dimension. Normal enhancement and excretion is seen. Bladder is partially distended. Ureters are within normal limits. No renal calculi are seen. Stomach/Bowel: Scattered diverticular change of the colon is noted. Very mild pericolonic inflammatory changes noted in the proximal sigmoid colon consistent with early diverticulitis. No perforation or abscess formation is noted. The appendix is within normal limits. The remainder of the colon is unremarkable. Stomach is decompressed. Small bowel is unremarkable. Vascular/Lymphatic: Aortic atherosclerosis. No enlarged abdominal or pelvic lymph nodes. Reproductive: Prostate is unremarkable. Other: No abdominal wall hernia or abnormality. No abdominopelvic ascites. Musculoskeletal: Degenerative changes of lumbar spine are noted. No compression deformity is noted. Review of the MIP images confirms the above findings  Today feeling closer to normal  Pain in R flank and side is better   Nervous about the kidney mass   Off ace for renal change  BP Readings from Last 3 Encounters:  04/20/20 126/68  04/18/20 (!) 142/60  04/17/20 (!) 141/64   Will check labs today  Patient Active Problem List   Diagnosis Date Noted  . Anemia 04/20/2020  . Renal mass, right 04/18/2020  . Fever 04/14/2020  .  AKI (acute kidney injury) ([place]) 04/14/2020  . Cholangitis 04/14/2020  . Unstable angina ([place])   . Chest pain 04/11/2020  . Left elbow pain 11/18/2019  . Pain and swelling of right lower leg 06/23/2019  . Coronary artery disease involving native coronary artery of native heart with angina pectoris ([place]) 01/18/2019  . Medicare annual wellness visit, subsequent 01/16/2019  . Ecchymosis 10/22/2018  . Obesity (BMI 30-39.9) 07/09/2018  . HOH  (hard of hearing) 03/31/2018  . Uncontrolled type 2 diabetes mellitus with hyperglycemia ([place]) 10/28/2017  . Subclinical hypothyroidism 10/28/2017  . Scoliosis   . Obstructive sleep apnea on CPAP   . Hx of skin cancer, basal cell   . Hyperlipidemia associated with type 2 diabetes mellitus ([place])   . GERD (gastroesophageal reflux disease)   . Chronic upper back pain   . Arthritis   . Allergy   . NSTEMI (non-ST elevated myocardial infarction) ([place]) 06/18/2017  . Actinic keratoses 04/29/2017  . Positive colorectal cancer screening using Cologuard test 10/31/2016  . BPH (benign prostatic hyperplasia) 09/04/2016  . B12 deficiency 11/22/2015  . Routine general medical examination at a health care facility 08/26/2015  . Abnormal nuclear cardiac imaging test   . Dyspnea on exertion 11/27/2013  . Encounter for examination of normal volunteer in research study 05/29/2013  . Prostate cancer screening 07/15/2012  . CAD S/P percutaneous coronary angioplasty 05/17/2011  . Nonspecific abnormal results of cardiovascular function study 04/30/2011  . Abnormal EKG 04/06/2011  . Right low back pain 01/13/2010  . Insulin dependent diabetes mellitus 10/04/2006  . ERECTILE DYSFUNCTION 10/04/2006  . Essential hypertension 10/04/2006  . Allergic rhinitis 10/04/2006  . OSTEOARTHRITIS 10/04/2006  . Sleep apnea 10/04/2006  . EDEMA 10/04/2006  . SKIN CANCER, HX OF 10/04/2006   Past Medical History:  Diagnosis Date  . AC (acromioclavicular) joint bone spurs    lt ankle  . Allergy    allergic rhinitis  . Anginal pain ([place])   . Arthritis    OA  . BPH (benign prostatic hyperplasia)    microwave tx of prostate  . CAD (coronary artery disease)    a. s/p cath in 2012 showing 70-75% LAD stenosis, 75% D1 stenosis, 75% OM1, 60-70% RCA stenosis, and 80% PDA --> medical therapy pursued b. similar findings by cath in 2016; 06/2017 DES to distal RCA  . Chronic upper back pain   . Diabetes mellitus    type II x8  years  . GERD (gastroesophageal reflux disease)   . HLD (hyperlipidemia)    10/12: TC 208, TG 213, HDL 36, LDL 129  . Hx of skin cancer, basal cell    many skin cancers removed/under constant treatment.  . Hypertension   . NSTEMI (non-ST elevated myocardial infarction) ([place]) 06/18/2017   DES to distal RCA  . Obstructive sleep apnea on CPAP   . Scoliosis    Past Surgical History:  Procedure Laterality Date  . BREAST SURGERY  2009   breast lump removed benign  . CARDIAC CATHETERIZATION N/A 03/15/2015   Procedure: Left Heart Cath and Coronary Angiography;  Surgeon: Belva Crome, MD;  Location: [facility];  Service: Cardiovascular;  Laterality: N/A;  . CORONARY STENT INTERVENTION N/A 06/18/2017   Procedure: CORONARY STENT INTERVENTION;  Surgeon: Jettie Booze, MD;  Location: [facility];  Service: Cardiovascular;  Laterality: N/A;  RCA  . LEFT HEART CATH AND CORONARY ANGIOGRAPHY N/A 06/18/2017   Procedure: LEFT HEART CATH AND CORONARY ANGIOGRAPHY;  Surgeon: Irish Lack,  Charlann Lange, MD;  Location: [facility];  Service: Cardiovascular;  Laterality: N/A;  . LEFT HEART CATH AND CORONARY ANGIOGRAPHY N/A 04/12/2020   Procedure: LEFT HEART CATH AND CORONARY ANGIOGRAPHY;  Surgeon: Martinique, Peter M, MD;  Location: [facility];  Service: Cardiovascular;  Laterality: N/A;  . ULTRASOUND GUIDANCE FOR VASCULAR ACCESS  06/18/2017   Procedure: Ultrasound Guidance For Vascular Access;  Surgeon: Jettie Booze, MD;  Location: [facility];  Service: Cardiovascular;;  right radial, right femoral   Social History   Tobacco Use  . Smoking status: Never Smoker  . Smokeless tobacco: Never Used  Vaping Use  . Vaping Use: Never used  Substance Use Topics  . Alcohol use: No    Alcohol/week: 0.0 standard drinks  . Drug use: No   Family History  Problem Relation Age of Onset  . Heart attack Brother   . Colon cancer Neg Hx    Allergies  Allergen Reactions  .  Loratadine     Not effective  . Simvastatin     Intolerant, joint pain   Current Outpatient Medications on File Prior to Visit  Medication Sig Dispense Refill  . acetaminophen (TYLENOL) 325 MG tablet Take 650 mg by mouth every 6 (six) hours as needed for mild pain or headache.     Marland Kitchen amoxicillin-clavulanate (AUGMENTIN) 875-125 MG tablet Take 1 tablet by mouth every 12 (twelve) hours for 7 days. 14 tablet 0  . aspirin 81 MG tablet Take 81 mg by mouth daily.      . Blood Glucose Monitoring Suppl (TRUE METRIX AIR GLUCOSE METER) W/DEVICE KIT 1 kit by Other route 2 (two) times daily. Check blood sugar twice daily and as directed. Dx E11.65 1 kit 0  . cetirizine (ZYRTEC) 10 MG tablet TAKE 1 TABLET EVERY DAY (Patient taking differently: Take 10 mg by mouth daily as needed for allergies. ) 90 tablet 1  . Coenzyme Q10 (CO Q 10 PO) Take 200 mg by mouth daily.    . Cyanocobalamin (VITAMIN B12) 1000 MCG TBCR Take 1,000 mcg by mouth daily. 30 tablet   . DROPLET PEN NEEDLES 31G X 6 MM MISC USE ONE TIME DAILY  AT  BEDTIME  WITH  LANTUS 100 each 0  . fluticasone (FLONASE) 50 MCG/ACT nasal spray Place 1 spray into both nostrils daily as needed for allergies.     Marland Kitchen glipiZIDE (GLUCOTROL) 10 MG tablet TAKE 1 TABLET TWICE DAILY WITH MEALS (Patient taking differently: Take 10 mg by mouth 2 (two) times daily before a meal. ) 180 tablet 1  . isosorbide mononitrate (IMDUR) 60 MG 24 hr tablet Take 1 tablet (60 mg total) by mouth daily. 90 tablet 3  . metFORMIN (GLUCOPHAGE) 1000 MG tablet TAKE 1 TABLET TWICE DAILY (Patient taking differently: Take 1,000 mg by mouth 2 (two) times daily with a meal. ) 180 tablet 1  . metoprolol tartrate (LOPRESSOR) 25 MG tablet Take 1 tablet (25 mg total) by mouth 2 (two) times daily. 60 tablet 0  . nitroGLYCERIN (NITROSTAT) 0.4 MG SL tablet Place 1 tablet (0.4 mg total) under the tongue every 5 (five) minutes as needed for chest pain. (Patient taking differently: Place 0.4 mg under the  tongue every 5 (five) minutes as needed for chest pain (max 3 doses). ) 25 tablet 3  . rosuvastatin (CRESTOR) 40 MG tablet Take 40 mg by mouth daily.     Marland Kitchen saccharomyces boulardii (FLORASTOR) 250 MG capsule Take 1 capsule (250  mg total) by mouth 2 (two) times daily for 14 days. 28 capsule 0  . TRUE METRIX BLOOD GLUCOSE TEST test strip CHECK BLOOD SUGAR TWICE DAILY 200 strip 2  . TRUEPLUS LANCETS 30G MISC Check blood sugar twice daily and as directed. Dx E11.65 200 each 1   No current facility-administered medications on file prior to visit.    Review of Systems  Constitutional: Negative for activity change, appetite change, fatigue, fever and unexpected weight change.  HENT: Positive for hearing loss. Negative for congestion, rhinorrhea, sore throat and trouble swallowing.   Eyes: Negative for pain, redness, itching and visual disturbance.  Respiratory: Negative for cough, chest tightness, shortness of breath and wheezing.   Cardiovascular: Negative for chest pain and palpitations.       Poor exercise tolerance  Gastrointestinal: Negative for abdominal distention, abdominal pain, anal bleeding, blood in stool, constipation, diarrhea, nausea and rectal pain.  Endocrine: Negative for cold intolerance, heat intolerance, polydipsia and polyuria.  Genitourinary: Negative for difficulty urinating, dysuria, frequency and urgency.  Musculoskeletal: Negative for arthralgias, back pain, joint swelling and myalgias.  Skin: Negative for pallor and rash.  Neurological: Negative for dizziness, tremors, weakness, numbness and headaches.  Hematological: Negative for adenopathy. Does not bruise/bleed easily.  Psychiatric/Behavioral: Negative for decreased concentration and dysphoric mood. The patient is not nervous/anxious.        Objective:   Physical Exam Constitutional:      General: He is not in acute distress.    Appearance: Normal appearance. He is well-developed. He is obese.  HENT:     Head:  Normocephalic and atraumatic.  Eyes:     General: No scleral icterus.    Conjunctiva/sclera: Conjunctivae normal.     Pupils: Pupils are equal, round, and reactive to light.  Neck:     Thyroid: No thyromegaly.     Vascular: No carotid bruit or JVD.  Cardiovascular:     Rate and Rhythm: Normal rate and regular rhythm.     Heart sounds: Normal heart sounds. No gallop.   Pulmonary:     Effort: Pulmonary effort is normal. No respiratory distress.     Breath sounds: Normal breath sounds. No wheezing or rales.  Abdominal:     General: Bowel sounds are normal. There is no distension or abdominal bruit.     Palpations: Abdomen is soft. There is no mass.     Tenderness: There is no abdominal tenderness. There is no right CVA tenderness, left CVA tenderness, guarding or rebound.     Hernia: No hernia is present.  Musculoskeletal:     Cervical back: Normal range of motion and neck supple. No tenderness.     Right lower leg: No edema.     Left lower leg: No edema.  Lymphadenopathy:     Cervical: No cervical adenopathy.  Skin:    General: Skin is warm and dry.     Coloration: Skin is not jaundiced.     Findings: No erythema or rash.  Neurological:     Mental Status: He is alert.     Coordination: Coordination normal.     Deep Tendon Reflexes: Reflexes are normal and symmetric.  Psychiatric:        Mood and Affect: Mood normal.           Assessment & Plan:   Problem List Items Addressed This Visit      Cardiovascular and Mediastinum   Essential hypertension - Primary    bp in fair control at this  time  BP Readings from Last 1 Encounters:  04/20/20 126/68   No changes needed from d/c, advised to continue holding ace inhibitor (lab today) and monitor at home Plan to continue metoprolol 25 mg bid and imdur 60 mg daily Most recent labs reviewed  Disc lifstyle change with low sodium diet and exercise          Digestive   Cholangitis    Reviewed hospital records, lab results  and studies in detail  abd pain and fever are resolved after hosp, abx tx (still on augmentin) ad MRCP Passed cbd stone is suspected  Repeat lft today  Ref to GI to f/u with Dr Loletha Carrow as noted in discharge summary Enc to eat lower fat diet  inst to call if any symptoms return      Relevant Orders   Ambulatory referral to Gastroenterology   Hepatic function panel   CBC with Differential/Platelet     Endocrine   Uncontrolled type 2 diabetes mellitus with hyperglycemia ([place])    Pt states am glucose readings are often low in 60s Diet is better Advised him to cut lantus from 60 to 50 u in pm  If no imp may need to reduce glipizide or add bedtime snack Lab Results  Component Value Date   HGBA1C 7.0 (H) 04/11/2020         Relevant Medications   insulin glargine (LANTUS SOLOSTAR) 100 UNIT/ML Solostar Pen     Genitourinary   AKI (acute kidney injury) ([place])   Relevant Orders   Renal function panel     Other   Renal mass, right   Anemia   Relevant Orders   CBC with Differential/Platelet   Iron    Other Visit Diagnoses    Need for influenza vaccination       Relevant Orders   Flu Vaccine QUAD High Dose(Fluad) (Completed)

## 2020-04-20 NOTE — Assessment & Plan Note (Signed)
bp in fair control at this time  BP Readings from Last 1 Encounters:  04/20/20 126/68   No changes needed from d/c, advised to continue holding ace inhibitor (lab today) and monitor at home Plan to continue metoprolol 25 mg bid and imdur 60 mg daily Most recent labs reviewed  Disc lifstyle change with low sodium diet and exercise

## 2020-04-25 ENCOUNTER — Other Ambulatory Visit: Payer: Self-pay

## 2020-04-25 ENCOUNTER — Encounter: Payer: Self-pay | Admitting: Urology

## 2020-04-25 ENCOUNTER — Ambulatory Visit: Payer: Medicare HMO | Admitting: Urology

## 2020-04-25 ENCOUNTER — Ambulatory Visit: Payer: Self-pay | Admitting: Urology

## 2020-04-25 VITALS — BP 147/72 | HR 76 | Ht 71.5 in | Wt 253.0 lb

## 2020-04-25 DIAGNOSIS — N2889 Other specified disorders of kidney and ureter: Secondary | ICD-10-CM | POA: Diagnosis not present

## 2020-04-25 DIAGNOSIS — N401 Enlarged prostate with lower urinary tract symptoms: Secondary | ICD-10-CM

## 2020-04-25 DIAGNOSIS — N138 Other obstructive and reflux uropathy: Secondary | ICD-10-CM | POA: Diagnosis not present

## 2020-04-25 NOTE — Progress Notes (Signed)
04/25/20 3:51 PM   Ryan Garcia 12-23-1933 [id number]  CC: Right renal mass, BPH  HPI: Ryan Garcia is a 86 year old male with an extensive cardiac history, CKD, diabetes who recently underwent cardiac catheterization, followed by hospitalization for likely cholangitis.  He underwent extensive work-up at this time with CT and MRI for etiology of his abdominal pain, and MRI ultimately showed a 2.6 cm possibly enhancing lesion in the right upper pole worrisome for possible renal neoplasm.  Interestingly, this was not very clearly appreciated on the CT.  A CT chest at that time showed no evidence of metastatic disease.  There is no prior cross-sectional imaging to review duration of the renal mass.  He denies any flank pain or gross hematuria.  He has a long history of CKD, and most recent creatinine was 1.16 with EGFR 57 which is improved from previous values.    He also has a history of BPH, and reportedly underwent a microwave procedure about 10 years ago with Dr. Eliberto Ivory that helped for a few years.  His primary urinary complaints are occasional urgency or frequency with occasional urge incontinence and some weak stream, but he is only minimally bothered at this time.  He is not on any medications at this time for his prostate.    PMH: Past Medical History:  Diagnosis Date  . AC (acromioclavicular) joint bone spurs    lt ankle  . Allergy    allergic rhinitis  . Anginal pain ([place])   . Arthritis    OA  . BPH (benign prostatic hyperplasia)    microwave tx of prostate  . CAD (coronary artery disease)    a. s/p cath in 2012 showing 70-75% LAD stenosis, 75% D1 stenosis, 75% OM1, 60-70% RCA stenosis, and 80% PDA --> medical therapy pursued b. similar findings by cath in 2016; 06/2017 DES to distal RCA  . Chronic upper back pain   . Diabetes mellitus    type II x8 years  . GERD (gastroesophageal reflux disease)   . HLD (hyperlipidemia)    10/12: TC 208, TG 213, HDL 36, LDL 129  . Hx of  skin cancer, basal cell    many skin cancers removed/under constant treatment.  . Hypertension   . NSTEMI (non-ST elevated myocardial infarction) ([place]) 06/18/2017   DES to distal RCA  . Obstructive sleep apnea on CPAP   . Scoliosis     Surgical History: Past Surgical History:  Procedure Laterality Date  . BREAST SURGERY  2009   breast lump removed benign  . CARDIAC CATHETERIZATION N/A 03/15/2015   Procedure: Left Heart Cath and Coronary Angiography;  Surgeon: Belva Crome, MD;  Location: [facility];  Service: Cardiovascular;  Laterality: N/A;  . CORONARY STENT INTERVENTION N/A 06/18/2017   Procedure: CORONARY STENT INTERVENTION;  Surgeon: Jettie Booze, MD;  Location: [facility];  Service: Cardiovascular;  Laterality: N/A;  RCA  . LEFT HEART CATH AND CORONARY ANGIOGRAPHY N/A 06/18/2017   Procedure: LEFT HEART CATH AND CORONARY ANGIOGRAPHY;  Surgeon: Jettie Booze, MD;  Location: [facility];  Service: Cardiovascular;  Laterality: N/A;  . LEFT HEART CATH AND CORONARY ANGIOGRAPHY N/A 04/12/2020   Procedure: LEFT HEART CATH AND CORONARY ANGIOGRAPHY;  Surgeon: Martinique, Peter M, MD;  Location: [facility];  Service: Cardiovascular;  Laterality: N/A;  . ULTRASOUND GUIDANCE FOR VASCULAR ACCESS  06/18/2017   Procedure: Ultrasound Guidance For Vascular Access;  Surgeon: Jettie Booze, MD;  Location:  [facility];  Service: Cardiovascular;;  right radial, right femoral    Family History: Family History  Problem Relation Age of Onset  . Heart attack Brother   . Colon cancer Neg Hx     Social History:  reports that he has never smoked. He has never used smokeless tobacco. He reports that he does not drink alcohol and does not use drugs.  Physical Exam: BP (!) 147/72 (BP Location: Left Arm, Patient Position: Sitting, Cuff Size: Large)   Pulse 76   Ht 5' 11.5" (1.816 m)   Wt 253 lb (114.8 kg)   BMI 34.79 kg/m    Constitutional:   Alert and oriented, No acute distress. Cardiovascular: No clubbing, cyanosis, or edema. Respiratory: Normal respiratory effort, no increased work of breathing. GI: Abdomen is soft, nontender, nondistended, no abdominal masses  Laboratory Data: See HPI  Pertinent Imaging: I have personally reviewed the CT and MRI, see HPI  Assessment & Plan:   In summary, he is an 86 year old comorbid male with CKD and extensive cardiac disease who was recently found to have an incidental 2.6 cm right upper pole renal mass.  We had a long conversation about these findings, and likely diagnosis of a renal neoplasm.  We discussed that only a biopsy could definitively diagnose this, but the imaging findings are certainly suspicious for a slow-growing kidney tumor.  We reviewed that these typically grow very slowly, especially in older patients with numerous comorbidities, no intervention is required and active surveillance is very safe.  We discussed the need for repeat imaging in 6 months, then likely on a yearly basis if size is stable.  We discussed the <4% chance of developing symptoms or metastatic disease over the next 15 years on active surveillance for his small renal mass.  We also discussed alternative more aggressive options including percutaneous ablation, partial nephrectomy, or radical nephrectomy.  He would not be a good candidate for partial nephrectomy or radical nephrectomy, but we could potentially consider percutaneous ablation in the future if he had significant growth rate of his renal mass.  Regarding his urinary symptoms, we discussed behavioral strategies at length, and I recommended considering Flomax in the future he has worsening of his urinary symptoms.  Active surveillance for small right renal mass RTC 6 months with MRI abdomen prior  Nickolas Madrid, MD 04/25/2020  [facility] [address] [place], [place] [address] [phone]

## 2020-04-26 ENCOUNTER — Ambulatory Visit: Payer: Medicare HMO | Admitting: Family Medicine

## 2020-05-18 ENCOUNTER — Encounter: Payer: Self-pay | Admitting: Nurse Practitioner

## 2020-05-23 ENCOUNTER — Ambulatory Visit (INDEPENDENT_AMBULATORY_CARE_PROVIDER_SITE_OTHER): Payer: Medicare HMO | Admitting: Family Medicine

## 2020-05-23 ENCOUNTER — Encounter: Payer: Self-pay | Admitting: Family Medicine

## 2020-05-23 ENCOUNTER — Other Ambulatory Visit: Payer: Self-pay

## 2020-05-23 VITALS — BP 140/80 | HR 79 | Temp 96.9°F | Ht 71.5 in | Wt 256.2 lb

## 2020-05-23 DIAGNOSIS — C4441 Basal cell carcinoma of skin of scalp and neck: Secondary | ICD-10-CM | POA: Diagnosis not present

## 2020-05-23 DIAGNOSIS — D2261 Melanocytic nevi of right upper limb, including shoulder: Secondary | ICD-10-CM | POA: Diagnosis not present

## 2020-05-23 DIAGNOSIS — N2889 Other specified disorders of kidney and ureter: Secondary | ICD-10-CM

## 2020-05-23 DIAGNOSIS — E785 Hyperlipidemia, unspecified: Secondary | ICD-10-CM

## 2020-05-23 DIAGNOSIS — E1169 Type 2 diabetes mellitus with other specified complication: Secondary | ICD-10-CM

## 2020-05-23 DIAGNOSIS — D2262 Melanocytic nevi of left upper limb, including shoulder: Secondary | ICD-10-CM | POA: Diagnosis not present

## 2020-05-23 DIAGNOSIS — L570 Actinic keratosis: Secondary | ICD-10-CM | POA: Diagnosis not present

## 2020-05-23 DIAGNOSIS — Z85828 Personal history of other malignant neoplasm of skin: Secondary | ICD-10-CM | POA: Diagnosis not present

## 2020-05-23 DIAGNOSIS — D0439 Carcinoma in situ of skin of other parts of face: Secondary | ICD-10-CM | POA: Diagnosis not present

## 2020-05-23 DIAGNOSIS — E1165 Type 2 diabetes mellitus with hyperglycemia: Secondary | ICD-10-CM

## 2020-05-23 DIAGNOSIS — L821 Other seborrheic keratosis: Secondary | ICD-10-CM | POA: Diagnosis not present

## 2020-05-23 DIAGNOSIS — I10 Essential (primary) hypertension: Secondary | ICD-10-CM | POA: Diagnosis not present

## 2020-05-23 DIAGNOSIS — D2271 Melanocytic nevi of right lower limb, including hip: Secondary | ICD-10-CM | POA: Diagnosis not present

## 2020-05-23 DIAGNOSIS — D485 Neoplasm of uncertain behavior of skin: Secondary | ICD-10-CM | POA: Diagnosis not present

## 2020-05-23 DIAGNOSIS — D2272 Melanocytic nevi of left lower limb, including hip: Secondary | ICD-10-CM | POA: Diagnosis not present

## 2020-05-23 MED ORDER — LANTUS SOLOSTAR 100 UNIT/ML ~~LOC~~ SOPN
40.0000 [IU] | PEN_INJECTOR | Freq: Every day | SUBCUTANEOUS | 0 refills | Status: DC
Start: 2020-05-23 — End: 2020-07-13

## 2020-05-23 NOTE — Assessment & Plan Note (Signed)
Controlled with crestor and diet  Disc goals for lipids and reasons to control them Rev last labs with pt Rev low sat fat diet in detail Discussed goals Diet has recently improved

## 2020-05-23 NOTE — Patient Instructions (Addendum)
Give your cardiologist a call re: your blood pressure  Let them know how you are tolerating the metoprolol   Go ahead and cut lantus to 40 u daily since you are having low glucose   Keep eating a healthier diet  Be as active as you can safely be

## 2020-05-23 NOTE — Assessment & Plan Note (Signed)
bp is elevated (moreso at home) . BP: 140/80  He may not be tolerating metoprolol well- will plan to discuss with his cardiologist (will call today)  Was prev on lisinopril/hct in the hospital then held  Plan to continue imdur 60 mg daily

## 2020-05-23 NOTE — Progress Notes (Signed)
Subjective:    Patient ID: Ryan Garcia, male    DOB: 1934/01/27, 86 y.o.   MRN: [id number]  This visit occurred during the SARS-CoV-2 public health emergency.  Safety protocols were in place, including screening questions prior to the visit, additional usage of staff PPE, and extensive cleaning of exam room while observing appropriate contact time as indicated for disinfecting solutions.    HPI Pt presents for f/u of chronic medical problems   Wt Readings from Last 3 Encounters:  05/23/20 256 lb 3 oz (116.2 kg)  04/25/20 253 lb (114.8 kg)  04/20/20 251 lb (113.9 kg)   35.23 kg/m   Feels like he gives out too easy in general   He has confusion re: what medicines to take   bp was high coming in the door   bp is stable today  No cp or palpitations or headaches or edema  No side effects to medicines  BP Readings from Last 3 Encounters:  05/23/20 (!) 148/82  04/25/20 (!) 147/72  04/20/20 126/68     Metoprolol 25 mg bid (this was recently increased)-thinks it makes him tired  imdur 60 mg once daily  Used to be on lisinopril and hct-held/hospital   At home 160s avg on top / 48A diastolic  HR 16P  Per pt cardiology told him to call if bp goes up    Pulse Readings from Last 3 Encounters:  05/23/20 79  04/25/20 76  04/20/20 (!) 59     Lab Results  Component Value Date   CREATININE 1.16 04/20/2020   BUN 17 04/20/2020   NA 138 04/20/2020   K 4.1 04/20/2020   CL 106 04/20/2020   CO2 27 04/20/2020   Lab Results  Component Value Date   WBC 6.1 04/20/2020   HGB 11.8 Repeated and verified X2. (L) 04/20/2020   HCT 35.2 (L) 04/20/2020   MCV 90.4 04/20/2020   PLT 230.0 04/20/2020   Lab Results  Component Value Date   ALT 98 (H) 04/20/2020   AST 35 04/20/2020   ALKPHOS 173 (H) 04/20/2020   BILITOT 0.8 04/20/2020   had just been hosp for colangitis last visit   Hyperlipidemia  Lab Results  Component Value Date   CHOL 94 01/18/2020   HDL 31.40 (L) 01/18/2020     LDLCALC 41 01/18/2020   LDLDIRECT 78.5 11/17/2012   TRIG 108.0 01/18/2020   CHOLHDL 3 01/18/2020   Taking crestor 40 mg once daily (held for recent liver problems)   DM2 Lab Results  Component Value Date   HGBA1C 7.0 (H) 04/11/2020   After last hosp we had to dec lantus to 50 u daily due to hypoglycemia  Metformin 1000 mg bid  Glipizide 10 mg bid before meals  His glucose is still low in ams -often in the 60s  Has also cut back on eating  At night usually 150s   Watching a renal mas with urology  Planning 6 mo f/u and MRI  Patient Active Problem List   Diagnosis Date Noted  . Anemia 04/20/2020  . Renal mass, right 04/18/2020  . Fever 04/14/2020  . AKI (acute kidney injury) ([place]) 04/14/2020  . Cholangitis 04/14/2020  . Unstable angina ([place])   . Chest pain 04/11/2020  . Left elbow pain 11/18/2019  . Pain and swelling of right lower leg 06/23/2019  . Coronary artery disease involving native coronary artery of native heart with angina pectoris ([place]) 01/18/2019  . Medicare annual wellness visit, subsequent 01/16/2019  .  Ecchymosis 10/22/2018  . Obesity (BMI 30-39.9) 07/09/2018  . HOH (hard of hearing) 03/31/2018  . Uncontrolled type 2 diabetes mellitus with hyperglycemia ([place]) 10/28/2017  . Subclinical hypothyroidism 10/28/2017  . Scoliosis   . Obstructive sleep apnea on CPAP   . Hx of skin cancer, basal cell   . Hyperlipidemia associated with type 2 diabetes mellitus ([place])   . GERD (gastroesophageal reflux disease)   . Chronic upper back pain   . Arthritis   . Allergy   . NSTEMI (non-ST elevated myocardial infarction) ([place]) 06/18/2017  . Actinic keratoses 04/29/2017  . Positive colorectal cancer screening using Cologuard test 10/31/2016  . BPH (benign prostatic hyperplasia) 09/04/2016  . B12 deficiency 11/22/2015  . Routine general medical examination at a health care facility 08/26/2015  . Abnormal nuclear cardiac imaging test   . Dyspnea on exertion 11/27/2013   . Encounter for examination of normal volunteer in research study 05/29/2013  . Prostate cancer screening 07/15/2012  . CAD S/P percutaneous coronary angioplasty 05/17/2011  . Nonspecific abnormal results of cardiovascular function study 04/30/2011  . Abnormal EKG 04/06/2011  . Right low back pain 01/13/2010  . Insulin dependent diabetes mellitus 10/04/2006  . ERECTILE DYSFUNCTION 10/04/2006  . Essential hypertension 10/04/2006  . Allergic rhinitis 10/04/2006  . OSTEOARTHRITIS 10/04/2006  . Sleep apnea 10/04/2006  . EDEMA 10/04/2006  . SKIN CANCER, HX OF 10/04/2006   Past Medical History:  Diagnosis Date  . AC (acromioclavicular) joint bone spurs    lt ankle  . Allergy    allergic rhinitis  . Anginal pain ([place])   . Arthritis    OA  . BPH (benign prostatic hyperplasia)    microwave tx of prostate  . CAD (coronary artery disease)    a. s/p cath in 2012 showing 70-75% LAD stenosis, 75% D1 stenosis, 75% OM1, 60-70% RCA stenosis, and 80% PDA --> medical therapy pursued b. similar findings by cath in 2016; 06/2017 DES to distal RCA  . Chronic upper back pain   . Diabetes mellitus    type II x8 years  . GERD (gastroesophageal reflux disease)   . HLD (hyperlipidemia)    10/12: TC 208, TG 213, HDL 36, LDL 129  . Hx of skin cancer, basal cell    many skin cancers removed/under constant treatment.  . Hypertension   . NSTEMI (non-ST elevated myocardial infarction) ([place]) 06/18/2017   DES to distal RCA  . Obstructive sleep apnea on CPAP   . Scoliosis    Past Surgical History:  Procedure Laterality Date  . BREAST SURGERY  2009   breast lump removed benign  . CARDIAC CATHETERIZATION N/A 03/15/2015   Procedure: Left Heart Cath and Coronary Angiography;  Surgeon: Belva Crome, MD;  Location: [facility];  Service: Cardiovascular;  Laterality: N/A;  . CORONARY STENT INTERVENTION N/A 06/18/2017   Procedure: CORONARY STENT INTERVENTION;  Surgeon: Jettie Booze, MD;   Location: [facility];  Service: Cardiovascular;  Laterality: N/A;  RCA  . LEFT HEART CATH AND CORONARY ANGIOGRAPHY N/A 06/18/2017   Procedure: LEFT HEART CATH AND CORONARY ANGIOGRAPHY;  Surgeon: Jettie Booze, MD;  Location: [facility];  Service: Cardiovascular;  Laterality: N/A;  . LEFT HEART CATH AND CORONARY ANGIOGRAPHY N/A 04/12/2020   Procedure: LEFT HEART CATH AND CORONARY ANGIOGRAPHY;  Surgeon: Martinique, Peter M, MD;  Location: [facility];  Service: Cardiovascular;  Laterality: N/A;  . ULTRASOUND GUIDANCE FOR VASCULAR ACCESS  06/18/2017   Procedure:  Ultrasound Guidance For Vascular Access;  Surgeon: Jettie Booze, MD;  Location: [facility];  Service: Cardiovascular;;  right radial, right femoral   Social History   Tobacco Use  . Smoking status: Never Smoker  . Smokeless tobacco: Never Used  Vaping Use  . Vaping Use: Never used  Substance Use Topics  . Alcohol use: No    Alcohol/week: 0.0 standard drinks  . Drug use: No   Family History  Problem Relation Age of Onset  . Heart attack Brother   . Colon cancer Neg Hx    Allergies  Allergen Reactions  . Loratadine     Not effective  . Simvastatin     Intolerant, joint pain   Current Outpatient Medications on File Prior to Visit  Medication Sig Dispense Refill  . acetaminophen (TYLENOL) 325 MG tablet Take 650 mg by mouth every 6 (six) hours as needed for mild pain or headache.     Marland Kitchen aspirin 81 MG tablet Take 81 mg by mouth daily.      . Blood Glucose Monitoring Suppl (TRUE METRIX AIR GLUCOSE METER) W/DEVICE KIT 1 kit by Other route 2 (two) times daily. Check blood sugar twice daily and as directed. Dx E11.65 1 kit 0  . cetirizine (ZYRTEC) 10 MG tablet TAKE 1 TABLET EVERY DAY (Patient taking differently: Take 10 mg by mouth daily as needed for allergies. ) 90 tablet 1  . Coenzyme Q10 (CO Q 10 PO) Take 200 mg by mouth daily.    . Cyanocobalamin (VITAMIN B12) 1000 MCG TBCR Take 1,000  mcg by mouth daily. 30 tablet   . DROPLET PEN NEEDLES 31G X 6 MM MISC USE ONE TIME DAILY  AT  BEDTIME  WITH  LANTUS 100 each 0  . fluticasone (FLONASE) 50 MCG/ACT nasal spray Place 1 spray into both nostrils daily as needed for allergies.     Marland Kitchen glipiZIDE (GLUCOTROL) 10 MG tablet TAKE 1 TABLET TWICE DAILY WITH MEALS (Patient taking differently: Take 10 mg by mouth 2 (two) times daily before a meal. ) 180 tablet 1  . isosorbide mononitrate (IMDUR) 60 MG 24 hr tablet Take 1 tablet (60 mg total) by mouth daily. 90 tablet 3  . metFORMIN (GLUCOPHAGE) 1000 MG tablet TAKE 1 TABLET TWICE DAILY (Patient taking differently: Take 1,000 mg by mouth 2 (two) times daily with a meal. ) 180 tablet 1  . metoprolol tartrate (LOPRESSOR) 25 MG tablet Take 1 tablet (25 mg total) by mouth 2 (two) times daily. 60 tablet 0  . nitroGLYCERIN (NITROSTAT) 0.4 MG SL tablet Place 1 tablet (0.4 mg total) under the tongue every 5 (five) minutes as needed for chest pain. (Patient not taking: Reported on 04/25/2020) 25 tablet 3  . rosuvastatin (CRESTOR) 40 MG tablet Take 40 mg by mouth daily.     . TRUE METRIX BLOOD GLUCOSE TEST test strip CHECK BLOOD SUGAR TWICE DAILY 200 strip 2  . TRUEPLUS LANCETS 30G MISC Check blood sugar twice daily and as directed. Dx E11.65 200 each 1   No current facility-administered medications on file prior to visit.    Review of Systems  Constitutional: Positive for fatigue. Negative for activity change, appetite change, fever and unexpected weight change.  HENT: Negative for congestion, rhinorrhea, sore throat and trouble swallowing.   Eyes: Negative for pain, redness, itching and visual disturbance.  Respiratory: Positive for shortness of breath. Negative for cough, chest tightness and wheezing.   Cardiovascular: Negative for chest pain and palpitations.  Gastrointestinal: Negative for abdominal pain, blood in stool, constipation, diarrhea and nausea.  Endocrine: Negative for cold intolerance, heat  intolerance, polydipsia and polyuria.  Genitourinary: Negative for difficulty urinating, dysuria, frequency and urgency.  Musculoskeletal: Negative for arthralgias, joint swelling and myalgias.  Skin: Negative for pallor and rash.  Neurological: Negative for dizziness, tremors, weakness, numbness and headaches.  Hematological: Negative for adenopathy. Does not bruise/bleed easily.  Psychiatric/Behavioral: Negative for decreased concentration and dysphoric mood. The patient is not nervous/anxious.        Objective:   Physical Exam Constitutional:      General: He is not in acute distress.    Appearance: Normal appearance. He is well-developed. He is obese.  HENT:     Head: Normocephalic and atraumatic.  Eyes:     Conjunctiva/sclera: Conjunctivae normal.     Pupils: Pupils are equal, round, and reactive to light.  Neck:     Thyroid: No thyromegaly.     Vascular: No carotid bruit or JVD.  Cardiovascular:     Rate and Rhythm: Normal rate and regular rhythm.     Heart sounds: Normal heart sounds. No gallop.   Pulmonary:     Effort: Pulmonary effort is normal. No respiratory distress.     Breath sounds: Normal breath sounds. No wheezing or rales.  Abdominal:     General: Bowel sounds are normal. There is no distension or abdominal bruit.     Palpations: There is no mass.  Musculoskeletal:     Cervical back: Normal range of motion and neck supple.     Right lower leg: Edema present.     Left lower leg: Edema present.     Comments: Trace pedal edema   Lymphadenopathy:     Cervical: No cervical adenopathy.  Skin:    General: Skin is warm and dry.     Coloration: Skin is not pale.     Findings: No erythema or rash.  Neurological:     Mental Status: He is alert.     Coordination: Coordination normal.     Deep Tendon Reflexes: Reflexes are normal and symmetric. Reflexes normal.  Psychiatric:        Mood and Affect: Mood normal.           Assessment & Plan:   Problem List  Items Addressed This Visit      Cardiovascular and Mediastinum   Essential hypertension    bp is elevated (moreso at home) . BP: 140/80  He may not be tolerating metoprolol well- will plan to discuss with his cardiologist (will call today)  Was prev on lisinopril/hct in the hospital then held  Plan to continue imdur 60 mg daily           Endocrine   Hyperlipidemia associated with type 2 diabetes mellitus ([place])    Controlled with crestor and diet  Disc goals for lipids and reasons to control them Rev last labs with pt Rev low sat fat diet in detail Discussed goals Diet has recently improved      Relevant Medications   insulin glargine (LANTUS SOLOSTAR) 100 UNIT/ML Solostar Pen   Uncontrolled type 2 diabetes mellitus with hyperglycemia ([facility]) - Primary    Lab Results  Component Value Date   HGBA1C 7.0 (H) 04/11/2020   Since then- low glucose readings (state he is eating quite a bit better) Plan to cut lantus another 10 u to 40  He will update with glucose readings F/u 3 mo  Plan to continue metformin  and glipizide        Relevant Medications   insulin glargine (LANTUS SOLOSTAR) 100 UNIT/ML Solostar Pen     Other   Renal mass, right    Followed by urology [place] Planning f/u and MRI in 6 mo No symptoms

## 2020-05-23 NOTE — Assessment & Plan Note (Signed)
Followed by urology [place] Planning f/u and MRI in 6 mo No symptoms

## 2020-05-23 NOTE — Assessment & Plan Note (Signed)
Lab Results  Component Value Date   HGBA1C 7.0 (H) 04/11/2020   Since then- low glucose readings (state he is eating quite a bit better) Plan to cut lantus another 10 u to 40  He will update with glucose readings F/u 3 mo  Plan to continue metformin and glipizide

## 2020-05-24 ENCOUNTER — Telehealth: Payer: Self-pay | Admitting: Cardiology

## 2020-05-24 NOTE — Telephone Encounter (Signed)
Pt c/o medication issue:  1. Name of Medication: metoprolol tartrate (LOPRESSOR) 25 MG tablet  2. How are you currently taking this medication (dosage and times per day)? As directed  3. Are you having a reaction (difficulty breathing--STAT)? no  4. What is your medication issue? Patient needs a refill on medication. He was prescribed medication in the hospital so he went to his PCP to have it refilled but she refused to refill it because of his extreme fatigue and feeling sluggish. She advised him to reach out to Dr. Stanford Breed to see what he thinks about refilling medication. Please advise.

## 2020-05-24 NOTE — Telephone Encounter (Signed)
Spoke with wife . She states patient has been more fatigue since discharge from hospital and restarting Metoprolol tartrate 25 mg twice a day . Patient has only 8 pills left . If Dr Stanford Breed would like to keep him on medication . Wife request medication refill be sent to [facility] mail order.  Patient was seen at primary on 05/23/20 ( yesterday)  RN informed wife to have patient take 12.5 mg ( 1/2 tablet of 25 mg)  twice a day until Dr Stanford Breed can review information .  ( medication has not been changed on medication list)

## 2020-05-25 ENCOUNTER — Other Ambulatory Visit (INDEPENDENT_AMBULATORY_CARE_PROVIDER_SITE_OTHER): Payer: Medicare HMO

## 2020-05-25 ENCOUNTER — Ambulatory Visit: Payer: Medicare HMO | Admitting: Nurse Practitioner

## 2020-05-25 ENCOUNTER — Encounter: Payer: Self-pay | Admitting: Nurse Practitioner

## 2020-05-25 VITALS — BP 138/82 | HR 56 | Ht 71.5 in | Wt 259.0 lb

## 2020-05-25 DIAGNOSIS — K8309 Other cholangitis: Secondary | ICD-10-CM

## 2020-05-25 LAB — HEPATIC FUNCTION PANEL
ALT: 23 U/L (ref 0–53)
AST: 17 U/L (ref 0–37)
Albumin: 3.9 g/dL (ref 3.5–5.2)
Alkaline Phosphatase: 78 U/L (ref 39–117)
Bilirubin, Direct: 0.2 mg/dL (ref 0.0–0.3)
Total Bilirubin: 0.7 mg/dL (ref 0.2–1.2)
Total Protein: 6.5 g/dL (ref 6.0–8.3)

## 2020-05-25 MED ORDER — METOPROLOL TARTRATE 25 MG PO TABS
12.5000 mg | ORAL_TABLET | Freq: Two times a day (BID) | ORAL | 3 refills | Status: DC
Start: 2020-05-25 — End: 2020-10-15

## 2020-05-25 NOTE — Telephone Encounter (Signed)
Take lopressor 12.5 mg bid Kirk Ruths

## 2020-05-25 NOTE — Telephone Encounter (Signed)
Spoke with pt wife, Aware of dr crenshaw's recommendations.  New script sent to the pharmacy  

## 2020-05-25 NOTE — Progress Notes (Signed)
ASSESSMENT AND PLAN    # 86 yo male with multiple comorbidities hospitalized last month with abdominal pain, fever, cholelithiasis. Presumed to have cholangitis with passed gallstone. Completed post-hospitalization antibiotics. Feels "great", no abdominal pain.   -- Repeat liver chemistries, they have been on a downward trend.  --Discussed situation with patient and his wife. They understand that he could have a recurrence of choledocholithiasis, especially since his gallbladder is intact with gallstones present.   # Renal mass. Incidental finding a 2.6 cm right renal mass on MRI. Saw Urologist in [place]. [place] felt to be slow growing neoplasm. Biopsy not pursued. Plan is for surveillance with repeat imaging in 6 moths.   HISTORY OF PRESENT ILLNESS     Primary Gastroenterologist : Wilfrid Lund, MD  Chief Complaint : hospital follow up  Ryan Garcia is a 86 y.o. male with PMH / [place] significant for,  but not necessarily limited to:  severe cardiac disease / hx of stents,  DM, hypertension, hyperlipidemia, OSA, CKD, obesity, adenomatous colon polyps , cholelithiasis, acute cholangitis  Patient has severe CAD. He was hospitalized mid October for unstable angina, underwent a left heart cath and found to have severe cardiac disease.  No additional stents were placed.  Two days later, on 10/14,  patient went to ED with rightt sided chest pain. He reported fevers and chills at home since his heart cath.  In ED his WBC was normal. He had AKI. His liver chemistries were elevated compared to just a few days prior.  AST was 131 / ALT 322, total bilirubin was 2.1, alkaline phosphatase 214.   CT scan of the abdomen pelvis showed cholelithiasis and possible mild diverticulitis.  MRCP confirmed cholelithiasis, no biliary duct dilation or evidence for choledocholithiasis.  A right renal mass measuring 2.6 cm was seen, consistent with renal cell neoplasm.  There was mild inflammatory stranding  surrounding the second portion of the duodenum consistent with duodenitis.  Small duodenal diverticulum noted.  We were asked to see the patient for evaluation of abdominal pain and fever.  We, and General Surgery felt presentation compatible with cholangitis / passed CBD stone.Marland Kitchen He was treated with IV antibiotics. Liver tests improved. Given improvement we did not recommend ERCP and given severe cardiac disease patient was not a candidate for cholecystectomy.  Discussions were held about a CABG with subsequent cholecystectomy. Patient did not want to undergo cardiac bypass surgery. He was transitioned to oral antibiotics and released home on 10/17. In the absence of abdominal pain it was not felt that he had diverticulitis as suggested by imaging  Interval History:  Patient is here with his wife. They aren't clear about why this appointment was made. Patient says he feels great. He completed Augmentin. He has not abdominal pain or fevers. Bowel movements are normal. Appetite is okay.     Past Medical History:  Diagnosis Date  . AC (acromioclavicular) joint bone spurs    lt ankle  . Allergy    allergic rhinitis  . Anginal pain ([place])   . Arthritis    OA  . BPH (benign prostatic hyperplasia)    microwave tx of prostate  . CAD (coronary artery disease)    a. s/p cath in 2012 showing 70-75% LAD stenosis, 75% D1 stenosis, 75% OM1, 60-70% RCA stenosis, and 80% PDA --> medical therapy pursued b. similar findings by cath in 2016; 06/2017 DES to distal RCA  . Chronic upper back pain   . Diabetes mellitus  type II x8 years  . GERD (gastroesophageal reflux disease)   . HLD (hyperlipidemia)    10/12: TC 208, TG 213, HDL 36, LDL 129  . Hx of skin cancer, basal cell    many skin cancers removed/under constant treatment.  . Hypertension   . NSTEMI (non-ST elevated myocardial infarction) ([place]) 06/18/2017   DES to distal RCA  . Obstructive sleep apnea on CPAP   . Scoliosis     Current  Medications, Allergies, Past Surgical History, Family History and Social History were reviewed in [employer] record.   Current Outpatient Medications  Medication Sig Dispense Refill  . acetaminophen (TYLENOL) 325 MG tablet Take 650 mg by mouth every 6 (six) hours as needed for mild pain or headache.     Marland Kitchen aspirin 81 MG tablet Take 81 mg by mouth daily.      . Blood Glucose Monitoring Suppl (TRUE METRIX AIR GLUCOSE METER) W/DEVICE KIT 1 kit by Other route 2 (two) times daily. Check blood sugar twice daily and as directed. Dx E11.65 1 kit 0  . cetirizine (ZYRTEC) 10 MG tablet TAKE 1 TABLET EVERY DAY (Patient taking differently: Take 10 mg by mouth daily as needed for allergies. ) 90 tablet 1  . Coenzyme Q10 (CO Q 10 PO) Take 200 mg by mouth daily.    . Cyanocobalamin (VITAMIN B12) 1000 MCG TBCR Take 1,000 mcg by mouth daily. 30 tablet   . DROPLET PEN NEEDLES 31G X 6 MM MISC USE ONE TIME DAILY  AT  BEDTIME  WITH  LANTUS 100 each 0  . fluticasone (FLONASE) 50 MCG/ACT nasal spray Place 1 spray into both nostrils daily as needed for allergies.     Marland Kitchen glipiZIDE (GLUCOTROL) 10 MG tablet TAKE 1 TABLET TWICE DAILY WITH MEALS (Patient taking differently: Take 10 mg by mouth 2 (two) times daily before a meal. ) 180 tablet 1  . insulin glargine (LANTUS SOLOSTAR) 100 UNIT/ML Solostar Pen Inject 40 Units into the skin at bedtime. 1 mL 0  . isosorbide mononitrate (IMDUR) 60 MG 24 hr tablet Take 1 tablet (60 mg total) by mouth daily. 90 tablet 3  . metFORMIN (GLUCOPHAGE) 1000 MG tablet TAKE 1 TABLET TWICE DAILY (Patient taking differently: Take 1,000 mg by mouth 2 (two) times daily with a meal. ) 180 tablet 1  . metoprolol tartrate (LOPRESSOR) 25 MG tablet Take 1 tablet (25 mg total) by mouth 2 (two) times daily. 60 tablet 0  . nitroGLYCERIN (NITROSTAT) 0.4 MG SL tablet Place 1 tablet (0.4 mg total) under the tongue every 5 (five) minutes as needed for chest pain. 25 tablet 3  . rosuvastatin  (CRESTOR) 40 MG tablet Take 40 mg by mouth daily.     . TRUE METRIX BLOOD GLUCOSE TEST test strip CHECK BLOOD SUGAR TWICE DAILY 200 strip 2  . TRUEPLUS LANCETS 30G MISC Check blood sugar twice daily and as directed. Dx E11.65 200 each 1   No current facility-administered medications for this visit.    Review of Systems: No chest pain. No shortness of breath. No urinary complaints.   PHYSICAL EXAM :    Wt Readings from Last 3 Encounters:  05/25/20 259 lb (117.5 kg)  05/23/20 256 lb 3 oz (116.2 kg)  04/25/20 253 lb (114.8 kg)    BP 138/82   Pulse (!) 56   Ht 5' 11.5" (1.816 m)   Wt 259 lb (117.5 kg)   BMI 35.62 kg/m  Constitutional:  Pleasant male in  no acute distress. Psychiatric: Normal mood and affect. Behavior is normal. EENT: Pupils normal.  Conjunctivae are normal. No scleral icterus. Neck supple.  Cardiovascular: Normal rate, regular rhythm. Pulmonary/chest: Effort normal and breath sounds normal. No wheezing, rales or rhonchi. Abdominal: Soft, protuberant, nontender. Bowel sounds active throughout. There are no masses palpable. . Neurological: Alert and oriented to person place and time. Skin: Skin is warm and dry. No rashes noted.  Tye Savoy, NP  05/25/2020, 11:31 AM  I spent 30 minutes total reviewing records, obtaining history, performing exam, counseling patient and documenting visit / findings.

## 2020-05-25 NOTE — Patient Instructions (Signed)
If you are age 84 or older, your body mass index should be between 23-30. Your Body mass index is 35.62 kg/m. If this is out of the aforementioned range listed, please consider follow up with your Primary Care Provider.  If you are age 64 or younger, your body mass index should be between 19-25. Your Body mass index is 35.62 kg/m. If this is out of the aformentioned range listed, please consider follow up with your Primary Care Provider.   Your provider has requested that you go to the basement level for lab work before leaving today. Press "B" on the elevator. The lab is located at the first door on the left as you exit the elevator.

## 2020-05-25 NOTE — Telephone Encounter (Signed)
Unable to reach pt or leave a message  

## 2020-05-30 NOTE — Progress Notes (Signed)
____________________________________________________________  Attending physician addendum:  Thank you for sending this case to me. I have reviewed the entire note and agree with the plan.  LFTs have normalized.  Agree we must have high index of suspicion for recurrent episodes since gallstones still present.  Wilfrid Lund, MD  ____________________________________________________________

## 2020-07-07 DIAGNOSIS — C4441 Basal cell carcinoma of skin of scalp and neck: Secondary | ICD-10-CM | POA: Diagnosis not present

## 2020-07-12 ENCOUNTER — Telehealth: Payer: Self-pay | Admitting: Family Medicine

## 2020-07-12 NOTE — Progress Notes (Signed)
°  Chronic Care Management   Outreach Note  07/12/2020 Name: Ryan Garcia MRN: [id number] DOB: 1934-05-12  Referred by: Tower, Wynelle Fanny, MD Reason for referral : Chronic Care Management   An unsuccessful telephone outreach was attempted today. The patient was referred to the pharmacist for assistance with care management and care coordination.   Follow Up Plan:   Hilario Quarry  Upstream Scheduler

## 2020-07-13 ENCOUNTER — Telehealth: Payer: Self-pay | Admitting: *Deleted

## 2020-07-13 ENCOUNTER — Telehealth: Payer: Self-pay | Admitting: Family Medicine

## 2020-07-13 MED ORDER — LANTUS SOLOSTAR 100 UNIT/ML ~~LOC~~ SOPN: 40.0000 [IU] | PEN_INJECTOR | Freq: Every day | SUBCUTANEOUS | 1 refills | Status: DC

## 2020-07-13 NOTE — Chronic Care Management (AMB) (Signed)
  Chronic Care Management   Note  07/13/2020 Name: Ryan Garcia MRN: [id number] DOB: June 11, 1934  Ryan Garcia is a 87 y.o. year old male who is a primary care patient of Tower, Wynelle Fanny, MD. I reached out to Hinton Rao by phone today in response to a referral sent by [name]'s PCP, Tower, Wynelle Fanny, MD.   [name] was given information about Chronic Care Management services today including:  1. CCM service includes personalized support from designated clinical staff supervised by his physician, including individualized plan of care and coordination with other care providers 2. 24/7 contact phone numbers for assistance for urgent and routine care needs. 3. Service will only be billed when office clinical staff spend 20 minutes or more in a month to coordinate care. 4. Only one practitioner may furnish and bill the service in a calendar month. 5. The patient may stop CCM services at any time (effective at the end of the month) by phone call to the office staff.   Lelan Pons verbally agreed to assistance and services provided by embedded care coordination/care management team today.  Follow up plan:   [place]

## 2020-07-13 NOTE — Telephone Encounter (Signed)
Last Rx was put in as "no print", Rx filled now

## 2020-07-13 NOTE — Telephone Encounter (Signed)
-----   Message from [place], [facility] sent at 07/13/2020 10:32 AM EST ----- Regarding: Refill Request Insulin Patient called asking for refill on Lantus to his mail order pharmacy. I saw there was one sent in November but the quantity was only 1 mL so that could be the issue?  Thanks,  Debbora Dus, PharmD Clinical Pharmacist [facility] [phone]

## 2020-07-14 DIAGNOSIS — D0439 Carcinoma in situ of skin of other parts of face: Secondary | ICD-10-CM | POA: Diagnosis not present

## 2020-07-14 DIAGNOSIS — D2339 Other benign neoplasm of skin of other parts of face: Secondary | ICD-10-CM | POA: Diagnosis not present

## 2020-07-18 DIAGNOSIS — S0101XA Laceration without foreign body of scalp, initial encounter: Secondary | ICD-10-CM | POA: Diagnosis not present

## 2020-07-18 NOTE — Progress Notes (Signed)
HPI: FU CAD.Cardiac catheterization in the past has revealed significant three-vessel coronary artery disease. I reviewed those films with Dr. Percival Spanish and Dr. Lia Foyer and we felt medical therapy best option (pt also wanted to avoid CABG).Patient admitted December 2018 and ruled in for non-ST elevation myocardial infarction. Patient underwent repeat catheterization revealing severe three-vessel coronary disease. Left-sided disease was unchanged compared to previous catheterizations and the culprit was felt to be a new RCA lesion and he had PCI with DES. Echocardiogram October 2021 showed normal LV function, grade 1 diastolic dysfunction, mild biatrial enlargement.  Cardiac catheterization October 2021 showed 85% proximal LAD, 85% 1st diagonal, 75% mid LAD, 65% distal LAD, 80% circumflex, 95% 1st marginal, 75% 2nd marginal, 60% PDA, 70% posterior lateral, patent stent in the right coronary artery.  There were no targets for PCI.  Medical therapy recommended and can consider coronary artery bypass graft if refractory angina.  Readmitted with RUQ pain. Abdominal MRI October 2021 showed cholelithiasis, 6 cm mass upper pole of right kidney consistent with renal neoplasm.  Gastroenterology evaluated the patient and felt he may have passed a stone but no other therapy recommended as his symptoms improved.  General surgery also did not plan surgery given cardiac history.  Since last seen, he has some dyspnea on exertion.  Trace pedal edema.  He denies chest pain or syncope.  Current Outpatient Medications  Medication Sig Dispense Refill  . acetaminophen (TYLENOL) 325 MG tablet Take 650 mg by mouth every 6 (six) hours as needed for mild pain or headache.     Marland Kitchen aspirin 81 MG tablet Take 81 mg by mouth daily.    . Blood Glucose Monitoring Suppl (TRUE METRIX AIR GLUCOSE METER) W/DEVICE KIT 1 kit by Other route 2 (two) times daily. Check blood sugar twice daily and as directed. Dx E11.65 1 kit 0  .  cetirizine (ZYRTEC) 10 MG tablet TAKE 1 TABLET EVERY DAY (Patient taking differently: Take 10 mg by mouth daily as needed for allergies.) 90 tablet 1  . Coenzyme Q10 (CO Q 10 PO) Take 200 mg by mouth daily.    . Cyanocobalamin (VITAMIN B12) 1000 MCG TBCR Take 1,000 mcg by mouth daily. 30 tablet   . DROPLET PEN NEEDLES 31G X 6 MM MISC USE ONE TIME DAILY  AT  BEDTIME  WITH  LANTUS 100 each 0  . fluticasone (FLONASE) 50 MCG/ACT nasal spray Place 1 spray into both nostrils daily as needed for allergies.     Marland Kitchen glipiZIDE (GLUCOTROL) 10 MG tablet TAKE 1 TABLET TWICE DAILY WITH MEALS (Patient taking differently: Take 10 mg by mouth 2 (two) times daily before a meal.) 180 tablet 1  . insulin glargine (LANTUS SOLOSTAR) 100 UNIT/ML Solostar Pen Inject 40 Units into the skin at bedtime. 3 mL 1  . isosorbide mononitrate (IMDUR) 60 MG 24 hr tablet Take 1 tablet (60 mg total) by mouth daily. 90 tablet 3  . metFORMIN (GLUCOPHAGE) 1000 MG tablet TAKE 1 TABLET TWICE DAILY (Patient taking differently: Take 1,000 mg by mouth 2 (two) times daily with a meal.) 180 tablet 1  . metoprolol tartrate (LOPRESSOR) 25 MG tablet Take 0.5 tablets (12.5 mg total) by mouth 2 (two) times daily. 90 tablet 3  . nitroGLYCERIN (NITROSTAT) 0.4 MG SL tablet Place 1 tablet (0.4 mg total) under the tongue every 5 (five) minutes as needed for chest pain. 25 tablet 3  . rosuvastatin (CRESTOR) 40 MG tablet Take 40 mg by mouth daily.     Marland Kitchen  TRUE METRIX BLOOD GLUCOSE TEST test strip CHECK BLOOD SUGAR TWICE DAILY 200 strip 2  . TRUEPLUS LANCETS 30G MISC Check blood sugar twice daily and as directed. Dx E11.65 200 each 1   No current facility-administered medications for this visit.     Past Medical History:  Diagnosis Date  . AC (acromioclavicular) joint bone spurs    lt ankle  . Allergy    allergic rhinitis  . Anginal pain ([place])   . Arthritis    OA  . BPH (benign prostatic hyperplasia)    microwave tx of prostate  . CAD (coronary  artery disease)    a. s/p cath in 2012 showing 70-75% LAD stenosis, 75% D1 stenosis, 75% OM1, 60-70% RCA stenosis, and 80% PDA --> medical therapy pursued b. similar findings by cath in 2016; 06/2017 DES to distal RCA  . Chronic upper back pain   . Diabetes mellitus    type II x8 years  . GERD (gastroesophageal reflux disease)   . HLD (hyperlipidemia)    10/12: TC 208, TG 213, HDL 36, LDL 129  . Hx of skin cancer, basal cell    many skin cancers removed/under constant treatment.  . Hypertension   . NSTEMI (non-ST elevated myocardial infarction) ([place]) 06/18/2017   DES to distal RCA  . Obstructive sleep apnea on CPAP   . Scoliosis     Past Surgical History:  Procedure Laterality Date  . BREAST SURGERY  2009   breast lump removed benign  . CARDIAC CATHETERIZATION N/A 03/15/2015   Procedure: Left Heart Cath and Coronary Angiography;  Surgeon: Belva Crome, MD;  Location: [facility];  Service: Cardiovascular;  Laterality: N/A;  . CORONARY STENT INTERVENTION N/A 06/18/2017   Procedure: CORONARY STENT INTERVENTION;  Surgeon: Jettie Booze, MD;  Location: [facility];  Service: Cardiovascular;  Laterality: N/A;  RCA  . LEFT HEART CATH AND CORONARY ANGIOGRAPHY N/A 06/18/2017   Procedure: LEFT HEART CATH AND CORONARY ANGIOGRAPHY;  Surgeon: Jettie Booze, MD;  Location: [facility];  Service: Cardiovascular;  Laterality: N/A;  . LEFT HEART CATH AND CORONARY ANGIOGRAPHY N/A 04/12/2020   Procedure: LEFT HEART CATH AND CORONARY ANGIOGRAPHY;  Surgeon: Martinique, Peter M, MD;  Location: [facility];  Service: Cardiovascular;  Laterality: N/A;  . ULTRASOUND GUIDANCE FOR VASCULAR ACCESS  06/18/2017   Procedure: Ultrasound Guidance For Vascular Access;  Surgeon: Jettie Booze, MD;  Location: [facility];  Service: Cardiovascular;;  right radial, right femoral    Social History   Socioeconomic History  . Marital status: Married    Spouse name:  Not on file  . Number of children: Not on file  . Years of education: Not on file  . Highest education level: Not on file  Occupational History  . Not on file  Tobacco Use  . Smoking status: Never Smoker  . Smokeless tobacco: Never Used  Vaping Use  . Vaping Use: Never used  Substance and Sexual Activity  . Alcohol use: No    Alcohol/week: 0.0 standard drinks  . Drug use: No  . Sexual activity: Not Currently  Other Topics Concern  . Not on file  Social History Narrative   Married    Physically active hard of hearing   Social Determinants of Health   Financial Resource Strain: Not on file  Food Insecurity: Not on file  Transportation Needs: Not on file  Physical Activity: Not on file  Stress: Not on  file  Social Connections: Not on file  Intimate Partner Violence: Not on file    Family History  Problem Relation Age of Onset  . Heart attack Brother   . Colon cancer Neg Hx   . Colon polyps Neg Hx   . Stomach cancer Neg Hx   . Esophageal cancer Neg Hx     ROS: no fevers or chills, productive cough, hemoptysis, dysphasia, odynophagia, melena, hematochezia, dysuria, hematuria, rash, seizure activity, orthopnea, PND, claudication. Remaining systems are negative.  Physical Exam: Well-developed well-nourished in no acute distress.  Skin is warm and dry.  HEENT is normal.  Neck is supple.  Chest is clear to auscultation with normal expansion.  Cardiovascular exam is regular rate and rhythm.  Abdominal exam nontender or distended. No masses palpated. Extremities show trace edema. neuro grossly intact    A/P  1 coronary artery disease-patient denies chest pain since last office visit.  He continues to to want to avoid coronary artery bypass graft and would like medical therapy.  Continue aspirin, statin and beta-blocker.  Continue nitrates.  2 hypertension-blood pressure mildly elevated.  I have asked him to track this at home and we will adjust regimen as  needed.  3 hyperlipidemia-continue statin.  4 history of dyspnea-felt to be multifactorial including obesity hypoventilation syndrome and deconditioning.  5 history of renal mass-patient will follow up with urology for this issue.  6 history of gallstones-Per primary care.  Kirk Ruths, MD

## 2020-07-22 ENCOUNTER — Ambulatory Visit: Payer: Medicare HMO | Admitting: Family Medicine

## 2020-07-22 NOTE — Progress Notes (Deleted)
Virtual Visit via Video Note  I connected with Ryan Garcia on 07/22/20 at 10:00 AM EST by a video enabled telemedicine application and verified that I am speaking with the correct person using two identifiers.  Location: Patient: *** Provider: ***   I discussed the limitations of evaluation and management by telemedicine and the availability of in person appointments. The patient expressed understanding and agreed to proceed.  Parties involved in encounter  Patient:  Provider:  Loura Pardon MD   History of Present Illness:  Note-pt had a fall on 1/17 with scalp laceration that was [facility]    Last visit bp was elevated and pt did not think he was tolerating metoprolol well Lisinopril hct was held after last hosp  This was changed to lopressor 12.5 mg bid by cardiology  Takes imdur 60 mg daily   DM2 Lab Results  Component Value Date   HGBA1C 7.0 (H) 04/11/2020  was eating better at last visit and noted low readings  We cut lantus back another 10 u to 40 u daily  Takes metformin and glipizide    had f/u with GI re cholangitis and noted that liver chemistries had improved  Watching closely since gallstones are still present   Lab Results  Component Value Date   ALT 23 05/25/2020   AST 17 05/25/2020   ALKPHOS 78 05/25/2020   BILITOT 0.7 05/25/2020    Had covid imms pfizer in the spring  Booster status   Observations/Objective:   Assessment and Plan:   Follow Up Instructions:    I discussed the assessment and treatment plan with the patient. The patient was provided an opportunity to ask questions and all were answered. The patient agreed with the plan and demonstrated an understanding of the instructions.   The patient was advised to call back or seek an in-person evaluation if the symptoms worsen or if the condition fails to improve as anticipated.  I provided *** minutes of non-face-to-face time during this encounter.   Loura Pardon, MD

## 2020-07-25 ENCOUNTER — Other Ambulatory Visit: Payer: Self-pay

## 2020-07-25 ENCOUNTER — Encounter: Payer: Self-pay | Admitting: Cardiology

## 2020-07-25 ENCOUNTER — Ambulatory Visit: Payer: Medicare HMO | Admitting: Cardiology

## 2020-07-25 VITALS — BP 147/80 | HR 71 | Wt 252.2 lb

## 2020-07-25 DIAGNOSIS — I10 Essential (primary) hypertension: Secondary | ICD-10-CM

## 2020-07-25 DIAGNOSIS — Z5189 Encounter for other specified aftercare: Secondary | ICD-10-CM | POA: Diagnosis not present

## 2020-07-25 DIAGNOSIS — I2510 Atherosclerotic heart disease of native coronary artery without angina pectoris: Secondary | ICD-10-CM | POA: Diagnosis not present

## 2020-07-25 DIAGNOSIS — E7800 Pure hypercholesterolemia, unspecified: Secondary | ICD-10-CM | POA: Diagnosis not present

## 2020-07-25 NOTE — Patient Instructions (Signed)

## 2020-07-26 ENCOUNTER — Ambulatory Visit (INDEPENDENT_AMBULATORY_CARE_PROVIDER_SITE_OTHER): Payer: Medicare HMO | Admitting: Family Medicine

## 2020-07-26 ENCOUNTER — Other Ambulatory Visit: Payer: Self-pay

## 2020-07-26 ENCOUNTER — Encounter: Payer: Self-pay | Admitting: Family Medicine

## 2020-07-26 VITALS — BP 128/65 | HR 61 | Temp 97.4°F | Ht 71.5 in | Wt 254.0 lb

## 2020-07-26 DIAGNOSIS — R2689 Other abnormalities of gait and mobility: Secondary | ICD-10-CM | POA: Diagnosis not present

## 2020-07-26 DIAGNOSIS — I10 Essential (primary) hypertension: Secondary | ICD-10-CM

## 2020-07-26 DIAGNOSIS — E1165 Type 2 diabetes mellitus with hyperglycemia: Secondary | ICD-10-CM

## 2020-07-26 LAB — POCT GLYCOSYLATED HEMOGLOBIN (HGB A1C): Hemoglobin A1C: 7.3 % — AB (ref 4.0–5.6)

## 2020-07-26 NOTE — Assessment & Plan Note (Signed)
bp in fair control at this time  BP Readings from Last 1 Encounters:  07/26/20 128/65   No changes needed Most recent labs reviewed  Disc lifstyle change with low sodium diet and exercise  Plans to continue metoprolol 12.5 mg bid and imdur 60 mg daily

## 2020-07-26 NOTE — Assessment & Plan Note (Signed)
Worse control Lab Results  Component Value Date   HGBA1C 7.3 (A) 07/26/2020  also urine has glucose and that may be reason for freq urination  Pt does not want to inc lantus beyond 44 u  Plan to continue glipizide 10 mg bid and metformin 1000 mg bid  He would like to work on diet (not good) before inc any medicine Enc to start checking glucose more often if needed  Chair exercise encouraged/ pt not interested

## 2020-07-26 NOTE — Assessment & Plan Note (Signed)
Very poor balance with some falls Pt declines any intervention including PT or asst devices

## 2020-07-26 NOTE — Patient Instructions (Addendum)
Check out chair exercise videos on line or by video to do at home   Try to get most of your carbohydrates from produce (with the exception of white potatoes)  Eat less bread/pasta/rice/snack foods/cereals/sweets and other items from the middle of the grocery store (processed carbs)  There is sugar in your urine- I think this is why you have frequent urination   You are due for your diabetic eye exam -get it when you can    We will contact you with A1C result and make a plan

## 2020-07-26 NOTE — Progress Notes (Signed)
Subjective:    Patient ID: Ryan Garcia, male    DOB: Jul 21, 1933, 87 y.o.   MRN: [id number]  This visit occurred during the SARS-CoV-2 public health emergency.  Safety protocols were in place, including screening questions prior to the visit, additional usage of staff PPE, and extensive cleaning of exam room while observing appropriate contact time as indicated for disinfecting solutions.    HPI Pt presents for f/u of chronic medical problems  Wt Readings from Last 3 Encounters:  07/26/20 254 lb (115.2 kg)  07/25/20 252 lb 3.2 oz (114.4 kg)  05/25/20 259 lb (117.5 kg)   34.93 kg/m  Urinary frequency-more lately  Night and day  ua pos for protein and glucose     Has not been as well since he had cholangitis   HTN bp is stable today  No cp or palpitations or headaches or edema  No side effects to medicines  BP Readings from Last 3 Encounters:  07/26/20 (!) 142/72  07/25/20 (!) 147/80  05/25/20 138/82    Taking metoprolol 12.5 mg bid imdur 60 mg daily   Pulse Readings from Last 3 Encounters:  07/26/20 61  07/25/20 71  05/25/20 (!) 56    HGBA1C 7.0 (H) 04/11/2020  lantus 44u Glipizide 10 mg bid  Metformin 1000 mg bid   Eye exam overdue  Cannot get an appt until July    Higher sugars lately  Over 100 ams  Up around 200 at night  Diet is not good -too much / also the wrong things  Does eat sweets and large meals   He is in the process of changing that  No exercise at all- balance is bad and will not go outside (he fell)   Lab Results  Component Value Date   CREATININE 1.16 04/20/2020   BUN 17 04/20/2020   NA 138 04/20/2020   K 4.1 04/20/2020   CL 106 04/20/2020   CO2 27 04/20/2020    Lab Results  Component Value Date   CHOL 94 01/18/2020   HDL 31.40 (L) 01/18/2020   LDLCALC 41 01/18/2020   LDLDIRECT 78.5 11/17/2012   TRIG 108.0 01/18/2020   CHOLHDL 3 01/18/2020   Had covid vaccines and booster  Review of Systems  Constitutional: Positive  for fatigue. Negative for activity change, appetite change, fever and unexpected weight change.  HENT: Negative for congestion, rhinorrhea, sore throat and trouble swallowing.   Eyes: Negative for pain, redness, itching and visual disturbance.  Respiratory: Negative for cough, chest tightness, shortness of breath and wheezing.   Cardiovascular: Negative for chest pain and palpitations.  Gastrointestinal: Negative for abdominal pain, blood in stool, constipation, diarrhea and nausea.  Endocrine: Negative for cold intolerance, heat intolerance, polydipsia and polyuria.  Genitourinary: Positive for frequency. Negative for difficulty urinating, dysuria and urgency.  Musculoskeletal: Positive for arthralgias, back pain and gait problem. Negative for joint swelling and myalgias.  Skin: Negative for pallor and rash.  Neurological: Negative for dizziness, tremors, weakness, numbness and headaches.       Poor balance and falls   Hematological: Negative for adenopathy. Does not bruise/bleed easily.  Psychiatric/Behavioral: Negative for decreased concentration and dysphoric mood. The patient is not nervous/anxious.        Per wife pt is not as mentally sharp as he used to be         Patient Active Problem List   Diagnosis Date Noted  . Poor balance 07/26/2020  . Anemia 04/20/2020  . Renal mass,  right 04/18/2020  . Fever 04/14/2020  . AKI (acute kidney injury) ([place]) 04/14/2020  . Cholangitis 04/14/2020  . Unstable angina ([place])   . Chest pain 04/11/2020  . Left elbow pain 11/18/2019  . Pain and swelling of right lower leg 06/23/2019  . Coronary artery disease involving native coronary artery of native heart with angina pectoris ([place]) 01/18/2019  . Medicare annual wellness visit, subsequent 01/16/2019  . Ecchymosis 10/22/2018  . Obesity (BMI 30-39.9) 07/09/2018  . HOH (hard of hearing) 03/31/2018  . Uncontrolled type 2 diabetes mellitus with hyperglycemia ([place]) 10/28/2017  . Subclinical  hypothyroidism 10/28/2017  . Scoliosis   . Obstructive sleep apnea on CPAP   . Hx of skin cancer, basal cell   . Hyperlipidemia associated with type 2 diabetes mellitus ([place])   . GERD (gastroesophageal reflux disease)   . Chronic upper back pain   . Arthritis   . Allergy   . NSTEMI (non-ST elevated myocardial infarction) ([place]) 06/18/2017  . Actinic keratoses 04/29/2017  . Positive colorectal cancer screening using Cologuard test 10/31/2016  . BPH (benign prostatic hyperplasia) 09/04/2016  . B12 deficiency 11/22/2015  . Routine general medical examination at a health care facility 08/26/2015  . Abnormal nuclear cardiac imaging test   . Dyspnea on exertion 11/27/2013  . Encounter for examination of normal volunteer in research study 05/29/2013  . Prostate cancer screening 07/15/2012  . CAD S/P percutaneous coronary angioplasty 05/17/2011  . Nonspecific abnormal results of cardiovascular function study 04/30/2011  . Abnormal EKG 04/06/2011  . Right low back pain 01/13/2010  . Insulin dependent diabetes mellitus 10/04/2006  . ERECTILE DYSFUNCTION 10/04/2006  . Essential hypertension 10/04/2006  . Allergic rhinitis 10/04/2006  . OSTEOARTHRITIS 10/04/2006  . Sleep apnea 10/04/2006  . EDEMA 10/04/2006  . SKIN CANCER, HX OF 10/04/2006   Past Medical History:  Diagnosis Date  . AC (acromioclavicular) joint bone spurs    lt ankle  . Allergy    allergic rhinitis  . Anginal pain ([place])   . Arthritis    OA  . BPH (benign prostatic hyperplasia)    microwave tx of prostate  . CAD (coronary artery disease)    a. s/p cath in 2012 showing 70-75% LAD stenosis, 75% D1 stenosis, 75% OM1, 60-70% RCA stenosis, and 80% PDA --> medical therapy pursued b. similar findings by cath in 2016; 06/2017 DES to distal RCA  . Chronic upper back pain   . Diabetes mellitus    type II x8 years  . GERD (gastroesophageal reflux disease)   . HLD (hyperlipidemia)    10/12: TC 208, TG 213, HDL 36, LDL 129  .  Hx of skin cancer, basal cell    many skin cancers removed/under constant treatment.  . Hypertension   . NSTEMI (non-ST elevated myocardial infarction) ([place]) 06/18/2017   DES to distal RCA  . Obstructive sleep apnea on CPAP   . Scoliosis    Past Surgical History:  Procedure Laterality Date  . BREAST SURGERY  2009   breast lump removed benign  . CARDIAC CATHETERIZATION N/A 03/15/2015   Procedure: Left Heart Cath and Coronary Angiography;  Surgeon: Belva Crome, MD;  Location: [facility];  Service: Cardiovascular;  Laterality: N/A;  . CORONARY STENT INTERVENTION N/A 06/18/2017   Procedure: CORONARY STENT INTERVENTION;  Surgeon: Jettie Booze, MD;  Location: [facility];  Service: Cardiovascular;  Laterality: N/A;  RCA  . LEFT HEART CATH AND CORONARY ANGIOGRAPHY N/A 06/18/2017   Procedure: LEFT  HEART CATH AND CORONARY ANGIOGRAPHY;  Surgeon: Jettie Booze, MD;  Location: [facility];  Service: Cardiovascular;  Laterality: N/A;  . LEFT HEART CATH AND CORONARY ANGIOGRAPHY N/A 04/12/2020   Procedure: LEFT HEART CATH AND CORONARY ANGIOGRAPHY;  Surgeon: Martinique, Peter M, MD;  Location: [facility];  Service: Cardiovascular;  Laterality: N/A;  . ULTRASOUND GUIDANCE FOR VASCULAR ACCESS  06/18/2017   Procedure: Ultrasound Guidance For Vascular Access;  Surgeon: Jettie Booze, MD;  Location: [facility];  Service: Cardiovascular;;  right radial, right femoral   Social History   Tobacco Use  . Smoking status: Never Smoker  . Smokeless tobacco: Never Used  Vaping Use  . Vaping Use: Never used  Substance Use Topics  . Alcohol use: No    Alcohol/week: 0.0 standard drinks  . Drug use: No   Family History  Problem Relation Age of Onset  . Heart attack Brother   . Colon cancer Neg Hx   . Colon polyps Neg Hx   . Stomach cancer Neg Hx   . Esophageal cancer Neg Hx    Allergies  Allergen Reactions  . Loratadine     Not effective  .  Simvastatin     Intolerant, joint pain   Current Outpatient Medications on File Prior to Visit  Medication Sig Dispense Refill  . acetaminophen (TYLENOL) 325 MG tablet Take 650 mg by mouth every 6 (six) hours as needed for mild pain or headache.     Marland Kitchen aspirin 81 MG tablet Take 81 mg by mouth daily.    . Blood Glucose Monitoring Suppl (TRUE METRIX AIR GLUCOSE METER) W/DEVICE KIT 1 kit by Other route 2 (two) times daily. Check blood sugar twice daily and as directed. Dx E11.65 1 kit 0  . cetirizine (ZYRTEC) 10 MG tablet TAKE 1 TABLET EVERY DAY (Patient taking differently: Take 10 mg by mouth daily as needed for allergies.) 90 tablet 1  . Coenzyme Q10 (CO Q 10 PO) Take 200 mg by mouth daily.    . Cyanocobalamin (VITAMIN B12) 1000 MCG TBCR Take 1,000 mcg by mouth daily. 30 tablet   . DROPLET PEN NEEDLES 31G X 6 MM MISC USE ONE TIME DAILY  AT  BEDTIME  WITH  LANTUS 100 each 0  . fluticasone (FLONASE) 50 MCG/ACT nasal spray Place 1 spray into both nostrils daily as needed for allergies.     Marland Kitchen glipiZIDE (GLUCOTROL) 10 MG tablet TAKE 1 TABLET TWICE DAILY WITH MEALS (Patient taking differently: Take 10 mg by mouth 2 (two) times daily before a meal.) 180 tablet 1  . insulin glargine (LANTUS SOLOSTAR) 100 UNIT/ML Solostar Pen Inject 40 Units into the skin at bedtime. 3 mL 1  . isosorbide mononitrate (IMDUR) 60 MG 24 hr tablet Take 1 tablet (60 mg total) by mouth daily. 90 tablet 3  . metFORMIN (GLUCOPHAGE) 1000 MG tablet TAKE 1 TABLET TWICE DAILY (Patient taking differently: Take 1,000 mg by mouth 2 (two) times daily with a meal.) 180 tablet 1  . metoprolol tartrate (LOPRESSOR) 25 MG tablet Take 0.5 tablets (12.5 mg total) by mouth 2 (two) times daily. 90 tablet 3  . nitroGLYCERIN (NITROSTAT) 0.4 MG SL tablet Place 1 tablet (0.4 mg total) under the tongue every 5 (five) minutes as needed for chest pain. 25 tablet 3  . rosuvastatin (CRESTOR) 40 MG tablet Take 40 mg by mouth daily.     . TRUE METRIX BLOOD  GLUCOSE TEST test strip CHECK BLOOD  SUGAR TWICE DAILY 200 strip 2  . TRUEPLUS LANCETS 30G MISC Check blood sugar twice daily and as directed. Dx E11.65 200 each 1   No current facility-administered medications on file prior to visit.    Objective:   Physical Exam Constitutional:      General: He is not in acute distress.    Appearance: Normal appearance. He is well-developed and well-nourished. He is obese. He is not ill-appearing or diaphoretic.  HENT:     Head: Normocephalic and atraumatic.     Ears:     Comments: Hard of hearing  Wife helps with history    Mouth/Throat:     Mouth: Oropharynx is clear and moist.  Eyes:     Extraocular Movements: EOM normal.     Conjunctiva/sclera: Conjunctivae normal.     Pupils: Pupils are equal, round, and reactive to light.  Neck:     Thyroid: No thyromegaly.     Vascular: No carotid bruit or JVD.  Cardiovascular:     Rate and Rhythm: Normal rate and regular rhythm.     Pulses: Intact distal pulses.     Heart sounds: Normal heart sounds. No gallop.   Pulmonary:     Effort: Pulmonary effort is normal. No respiratory distress.     Breath sounds: Normal breath sounds. No wheezing or rales.     Comments: No crackles Abdominal:     General: There is no abdominal bruit.     Comments: Protuberant  Central obesity  Musculoskeletal:        General: No edema.     Cervical back: Normal range of motion and neck supple.     Right lower leg: No edema.     Left lower leg: No edema.  Lymphadenopathy:     Cervical: No cervical adenopathy.  Skin:    General: Skin is warm and dry.     Coloration: Skin is not pale.     Findings: No rash.  Neurological:     Mental Status: He is alert.     Coordination: Coordination abnormal.  Psychiatric:        Mood and Affect: Mood and affect and mood normal.           Assessment & Plan:   Problem List Items Addressed This Visit      Cardiovascular and Mediastinum   Essential hypertension    bp in  fair control at this time  BP Readings from Last 1 Encounters:  07/26/20 128/65   No changes needed Most recent labs reviewed  Disc lifstyle change with low sodium diet and exercise  Plans to continue metoprolol 12.5 mg bid and imdur 60 mg daily         Endocrine   Uncontrolled type 2 diabetes mellitus with hyperglycemia ([place]) - Primary    Worse control Lab Results  Component Value Date   HGBA1C 7.3 (A) 07/26/2020  also urine has glucose and that may be reason for freq urination  Pt does not want to inc lantus beyond 44 u  Plan to continue glipizide 10 mg bid and metformin 1000 mg bid  He would like to work on diet (not good) before inc any medicine Enc to start checking glucose more often if needed  Chair exercise encouraged/ pt not interested       Relevant Orders   POCT glycosylated hemoglobin (Hb A1C) (Completed)     Other   Poor balance    Very poor balance with some falls Pt declines any  intervention including PT or asst devices

## 2020-07-28 DIAGNOSIS — Z4802 Encounter for removal of sutures: Secondary | ICD-10-CM | POA: Diagnosis not present

## 2020-08-01 ENCOUNTER — Other Ambulatory Visit: Payer: Self-pay | Admitting: Family Medicine

## 2020-08-02 ENCOUNTER — Other Ambulatory Visit: Payer: Self-pay | Admitting: Family Medicine

## 2020-08-02 ENCOUNTER — Emergency Department (HOSPITAL_COMMUNITY)
Admission: EM | Admit: 2020-08-02 | Discharge: 2020-08-02 | Disposition: A | Discharge status: Home / Self Care | Payer: Medicare HMO | Attending: Emergency Medicine | Admitting: Emergency Medicine

## 2020-08-02 ENCOUNTER — Other Ambulatory Visit: Payer: Self-pay

## 2020-08-02 ENCOUNTER — Emergency Department (HOSPITAL_COMMUNITY): Payer: Medicare HMO

## 2020-08-02 DIAGNOSIS — Y9389 Activity, other specified: Secondary | ICD-10-CM | POA: Diagnosis not present

## 2020-08-02 DIAGNOSIS — S0101XA Laceration without foreign body of scalp, initial encounter: Secondary | ICD-10-CM | POA: Insufficient documentation

## 2020-08-02 DIAGNOSIS — I10 Essential (primary) hypertension: Secondary | ICD-10-CM | POA: Insufficient documentation

## 2020-08-02 DIAGNOSIS — Z794 Long term (current) use of insulin: Secondary | ICD-10-CM | POA: Insufficient documentation

## 2020-08-02 DIAGNOSIS — I25118 Atherosclerotic heart disease of native coronary artery with other forms of angina pectoris: Secondary | ICD-10-CM | POA: Insufficient documentation

## 2020-08-02 DIAGNOSIS — E1165 Type 2 diabetes mellitus with hyperglycemia: Secondary | ICD-10-CM | POA: Diagnosis not present

## 2020-08-02 DIAGNOSIS — Z79899 Other long term (current) drug therapy: Secondary | ICD-10-CM | POA: Insufficient documentation

## 2020-08-02 DIAGNOSIS — W208XXA Other cause of strike by thrown, projected or falling object, initial encounter: Secondary | ICD-10-CM | POA: Diagnosis not present

## 2020-08-02 DIAGNOSIS — W19XXXA Unspecified fall, initial encounter: Secondary | ICD-10-CM | POA: Diagnosis not present

## 2020-08-02 DIAGNOSIS — Y92007 Garden or yard of unspecified non-institutional (private) residence as the place of occurrence of the external cause: Secondary | ICD-10-CM | POA: Insufficient documentation

## 2020-08-02 DIAGNOSIS — S0990XA Unspecified injury of head, initial encounter: Secondary | ICD-10-CM | POA: Diagnosis not present

## 2020-08-02 DIAGNOSIS — Z85828 Personal history of other malignant neoplasm of skin: Secondary | ICD-10-CM | POA: Diagnosis not present

## 2020-08-02 DIAGNOSIS — Z7982 Long term (current) use of aspirin: Secondary | ICD-10-CM | POA: Diagnosis not present

## 2020-08-02 DIAGNOSIS — E039 Hypothyroidism, unspecified: Secondary | ICD-10-CM | POA: Insufficient documentation

## 2020-08-02 DIAGNOSIS — R58 Hemorrhage, not elsewhere classified: Secondary | ICD-10-CM | POA: Diagnosis not present

## 2020-08-02 DIAGNOSIS — I6529 Occlusion and stenosis of unspecified carotid artery: Secondary | ICD-10-CM | POA: Diagnosis not present

## 2020-08-02 DIAGNOSIS — Z7984 Long term (current) use of oral hypoglycemic drugs: Secondary | ICD-10-CM | POA: Insufficient documentation

## 2020-08-02 DIAGNOSIS — S0191XA Laceration without foreign body of unspecified part of head, initial encounter: Secondary | ICD-10-CM | POA: Diagnosis not present

## 2020-08-02 DIAGNOSIS — Z8546 Personal history of malignant neoplasm of prostate: Secondary | ICD-10-CM | POA: Insufficient documentation

## 2020-08-02 DIAGNOSIS — S0181XA Laceration without foreign body of other part of head, initial encounter: Secondary | ICD-10-CM | POA: Diagnosis not present

## 2020-08-02 DIAGNOSIS — R42 Dizziness and giddiness: Secondary | ICD-10-CM | POA: Diagnosis not present

## 2020-08-02 IMAGING — CT CT HEAD W/O CM
4 series · 16 of 47 positions shown, 18 images · non-contrast
Comparison: None.

CLINICAL DATA: Struck by falling tree branch, scalp laceration

EXAM:
CT HEAD WITHOUT CONTRAST
TECHNIQUE: Contiguous axial images were obtained from the base of the skull
through the vertex without intravenous contrast.

[Series 3: head without · axial · non-contrast · 0.45mm/px · z∈[-137,-17]mm · 7 of 34 slices shown, 9 images]
[im 5/34  brain]
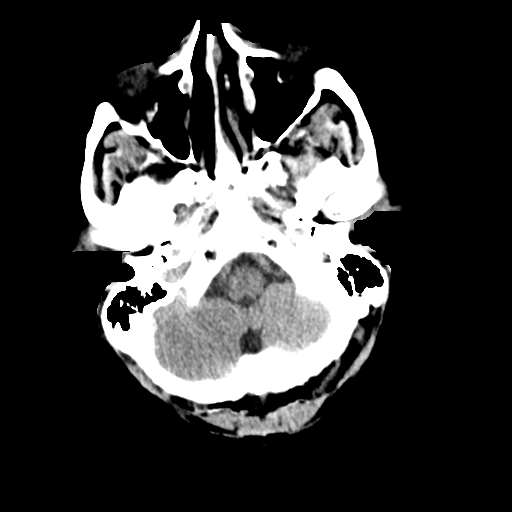
[im 5/34  bone]
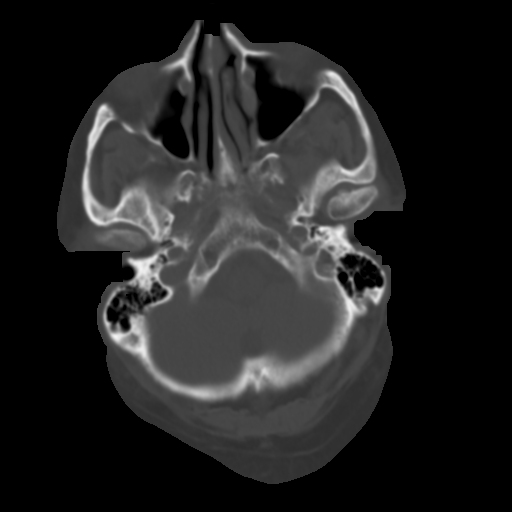
[im 9/34  brain]
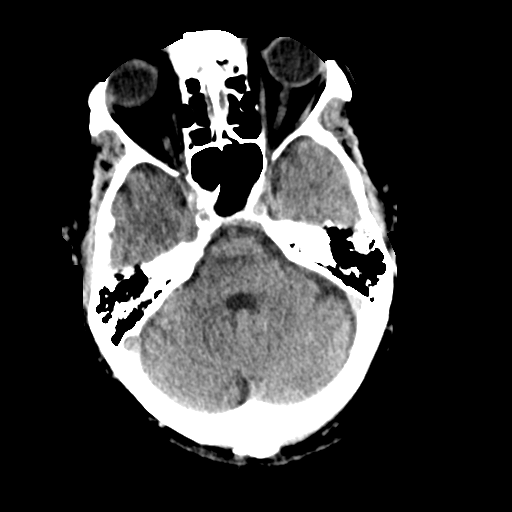
[im 13/34  brain]
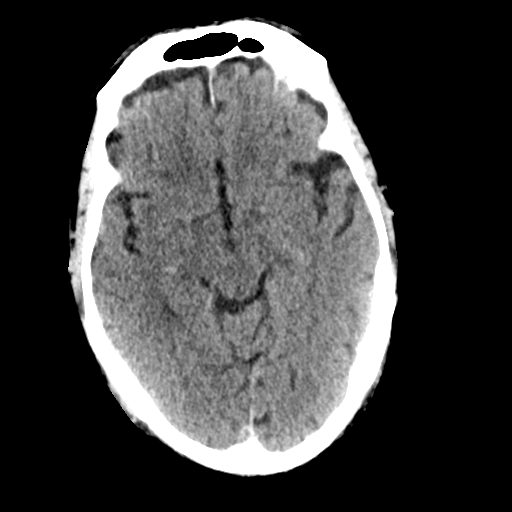
[im 17/34  brain]
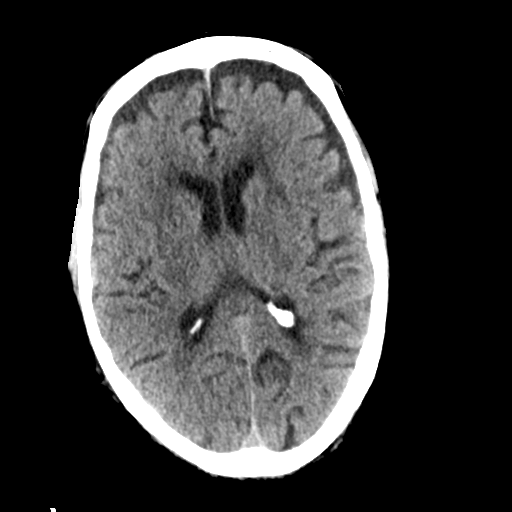
[im 21/34  brain]
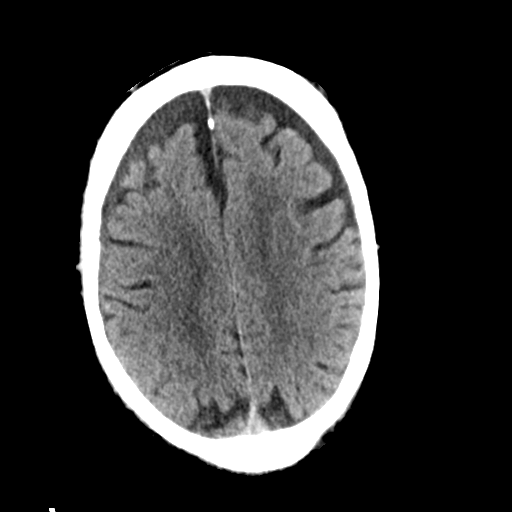
[im 21/34  bone]
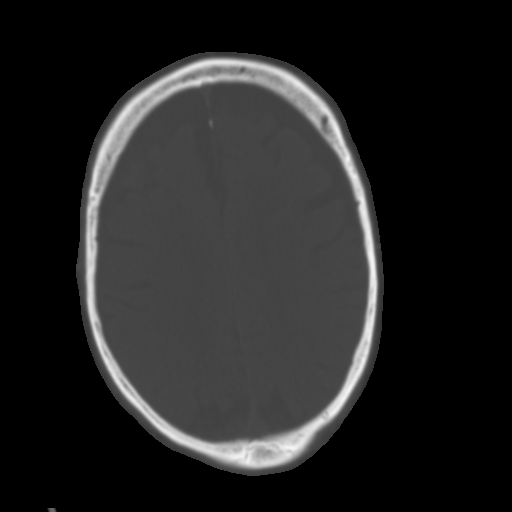
[im 25/34  brain]
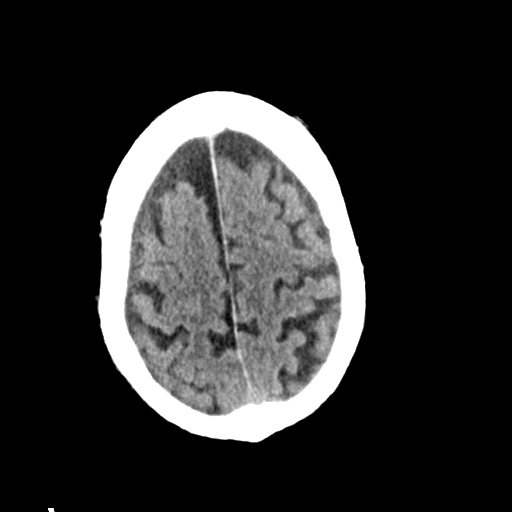
[im 29/34  brain]
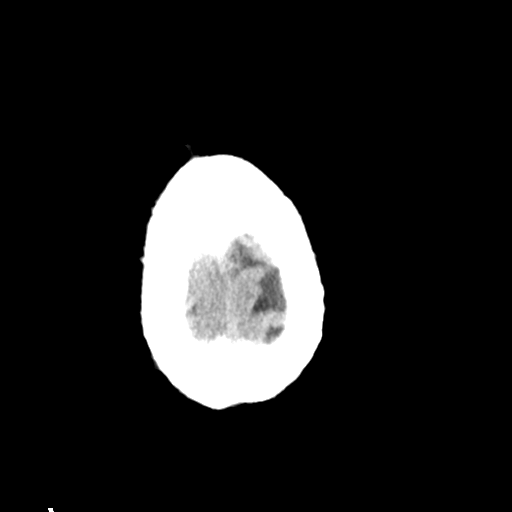

[Series 4: head bone · axial · 0.45mm/px · z∈[-141,-107]mm · 3 of 85 slices shown]
[im 9/85  bone]
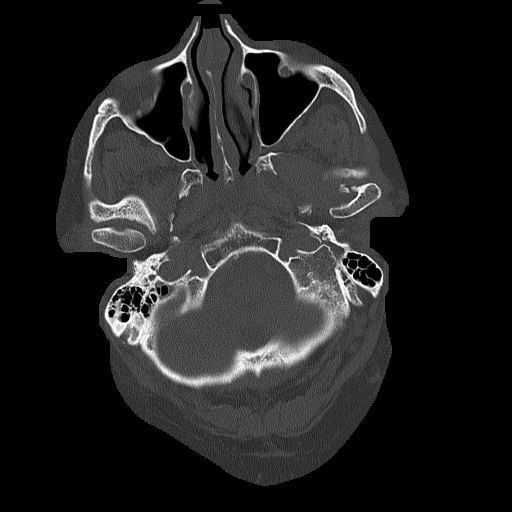
[im 17/85  bone]
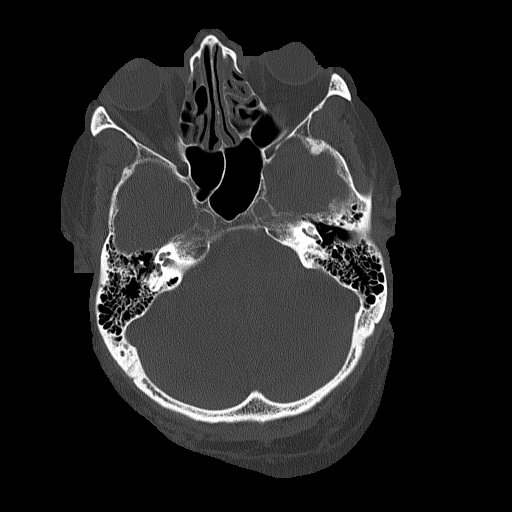
[im 26/85  bone]
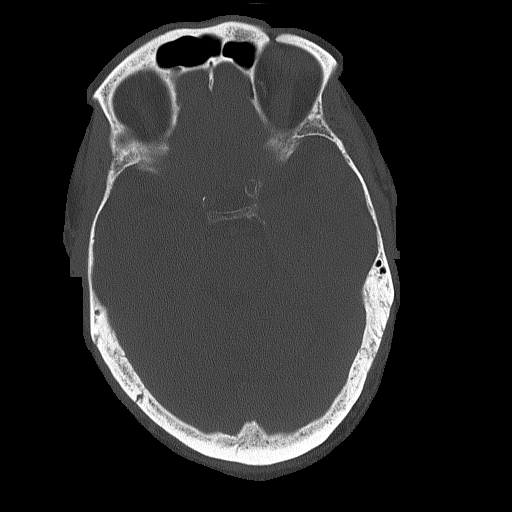

[Series 5: head without cor · coronal · non-contrast · 0.33mm/px · 3 of 76 slices shown]
[im 26/76  brain]
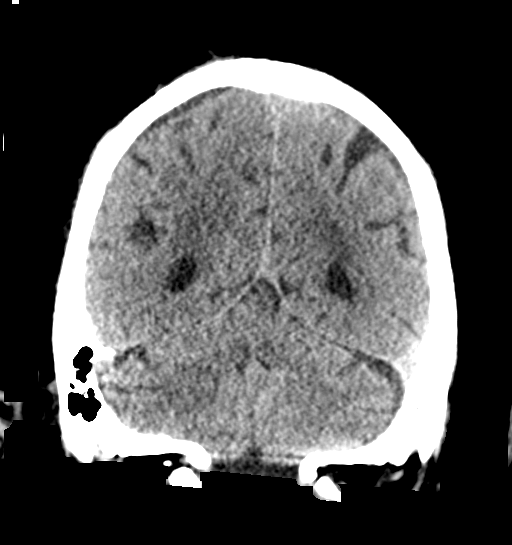
[im 34/76  brain]
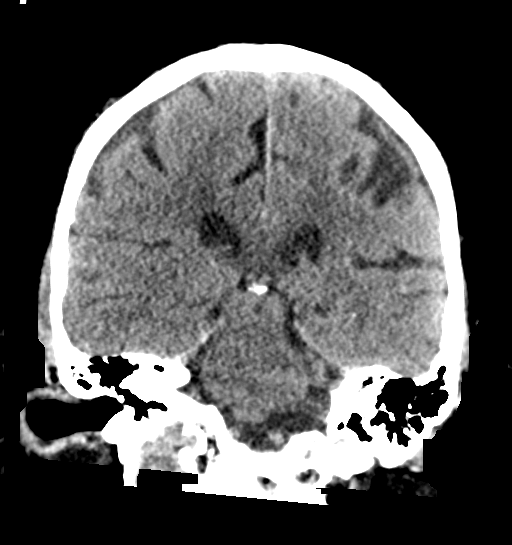
[im 42/76  brain]
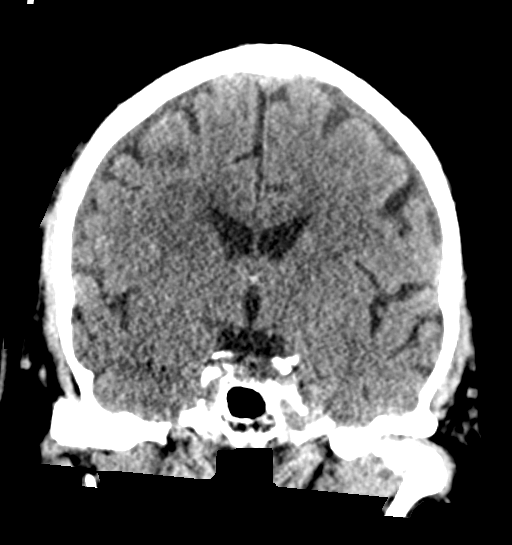

[Series 6: head without sag · sagittal · non-contrast · 0.35mm/px · 3 of 58 slices shown]
[im 20/58  brain]
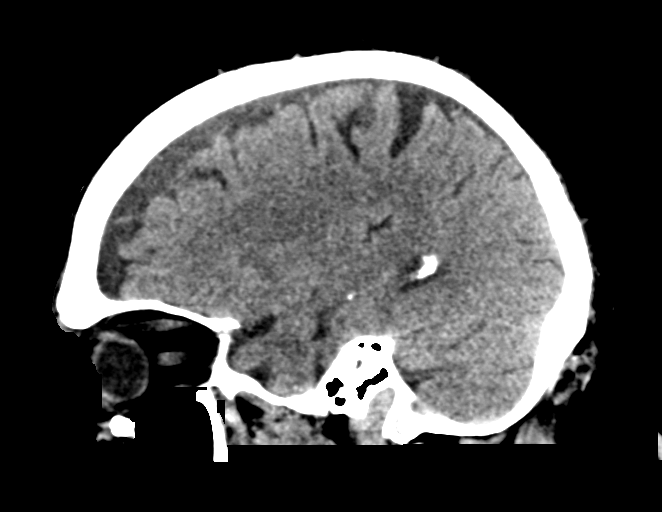
[im 29/58  brain]
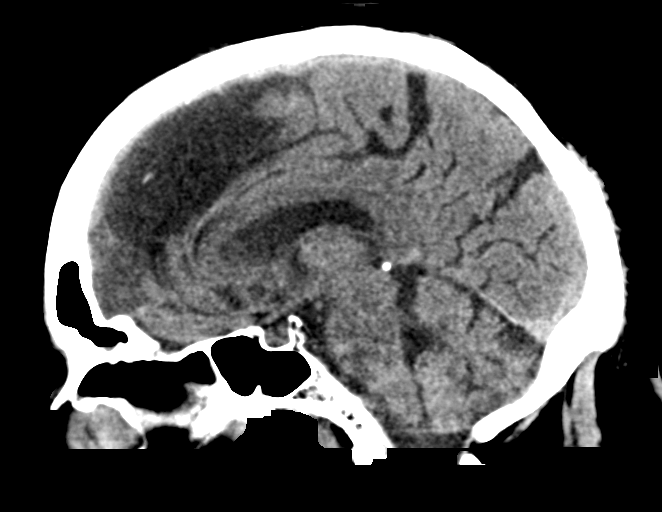
[im 39/58  brain]
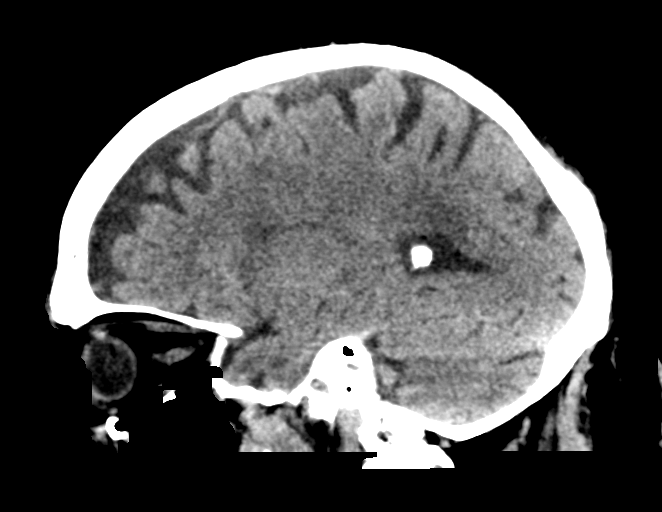

[16 of 47 positions shown; findings below may reference images not displayed]

FINDINGS: Brain: No evidence of acute infarction, hemorrhage, hydrocephalus,
extra-axial collection, visible mass lesion or mass effect.
Symmetric prominence of the ventricles, cisterns and sulci
compatible with mild frontal predominant parenchymal volume loss.
Patchy areas of white matter hypoattenuation are most compatible
with chronic microvascular angiopathy.

Vascular: Atherosclerotic calcification of the carotid siphons and
intradural vertebral arteries. No hyperdense vessel.

Skull: Soft tissue laceration and swelling is noted towards the
frontal vertex just to the right of midline. No subjacent calvarial
fracture is seen. No radiodense retained foreign bodies are evident.
No suspicious osseous lesions or other acute osseous injuries.

Sinuses/Orbits: Mild mural thickening predominately within the
ethmoid air cells. No layering air-fluid levels or pneumatized
secretions. Mastoid air cells and middle ear cavities are clear.
Prior left lens extraction. Included orbits are otherwise
unremarkable.

Other: Atlantodental arthrosis.
IMPRESSION: 1. Soft tissue laceration and swelling of the frontal scalp towards
the vertex just to the right of midline. No subjacent calvarial
fracture. No radiodense retained foreign bodies are evident.
2. No acute intracranial abnormality.
3. Mild frontal predominant parenchymal volume loss and chronic
microvascular angiopathy changes.

## 2020-08-02 MED ORDER — LIDOCAINE-EPINEPHRINE 1 %-1:100000 IJ SOLN: 20.0000 mL | Freq: Once | INTRAMUSCULAR | Status: DC

## 2020-08-02 NOTE — ED Triage Notes (Signed)
Pt arrives to ED BIB [facility] due to head laceration. Per EMS pt was on a tractor and picked up an oak log with the front of the tractor and the log fell on the pt hitting the pt on the head. Laceration is wrapped and bleeding is controlled. Per EMS pt is not on a blood thinner, Hx of HTN and Diabetes. Pt is A/O x4.  BP 168/84 HR 72 R 20 O2 98% RA CBG 202

## 2020-08-02 NOTE — ED Notes (Signed)
Ambulated pt, pt exhibited strong steady gait

## 2020-08-02 NOTE — Discharge Instructions (Signed)
You were seen in the ER for scalp wound  CT did not show any fracture or internal bleeding  You have 8 staples. These need to be removed in the next 7-10 days  Clean area with clean water and antibacterial soap at least twice daily. Apply antibiotic ointment twice daily.  Keep pressure dressing on for the next 24-48 hours. Apply ice at least every 6 hours for the next few days  Return for severe headache, persistent bleeding from wound, signs of infection

## 2020-08-02 NOTE — ED Provider Notes (Addendum)
[facility] EMERGENCY DEPARTMENT Provider Note   CSN: [id number] Arrival date & time: 08/02/20  1647     History Chief Complaint  Patient presents with  . Laceration    Ryan Garcia is a 87 y.o. male with history of CAD on medical therapy, non-STEMI on aspirin presents to the ED for evaluation of head injury that occurred immediately prior to arrival.  Patient was in his backyard on his tractor moving around some tree branches when the tree branch slipped out of the fork and it hit him on top of his head.  States the tree branch was about the diameter of a telephone pole.  Reports he would have kept working if he was not bleeding so much.  Denies significant pain, headache.  No visual changes, loss of consciousness, nausea, vomiting.  No neck pain.  Denies any other physical injuries.  Wife states patient just staples removed from his scalp from a injury a week ago.  Last tetanus was within last 5 years. No anticoagulants.   HPI     Past Medical History:  Diagnosis Date  . AC (acromioclavicular) joint bone spurs    lt ankle  . Allergy    allergic rhinitis  . Anginal pain ([place])   . Arthritis    OA  . BPH (benign prostatic hyperplasia)    microwave tx of prostate  . CAD (coronary artery disease)    a. s/p cath in 2012 showing 70-75% LAD stenosis, 75% D1 stenosis, 75% OM1, 60-70% RCA stenosis, and 80% PDA --> medical therapy pursued b. similar findings by cath in 2016; 06/2017 DES to distal RCA  . Chronic upper back pain   . Diabetes mellitus    type II x8 years  . GERD (gastroesophageal reflux disease)   . HLD (hyperlipidemia)    10/12: TC 208, TG 213, HDL 36, LDL 129  . Hx of skin cancer, basal cell    many skin cancers removed/under constant treatment.  . Hypertension   . NSTEMI (non-ST elevated myocardial infarction) ([place]) 06/18/2017   DES to distal RCA  . Obstructive sleep apnea on CPAP   . Scoliosis     Patient Active Problem List   Diagnosis Date  Noted  . Poor balance 07/26/2020  . Anemia 04/20/2020  . Renal mass, right 04/18/2020  . Fever 04/14/2020  . AKI (acute kidney injury) ([place]) 04/14/2020  . Cholangitis 04/14/2020  . Unstable angina ([place])   . Chest pain 04/11/2020  . Left elbow pain 11/18/2019  . Pain and swelling of right lower leg 06/23/2019  . Coronary artery disease involving native coronary artery of native heart with angina pectoris ([place]) 01/18/2019  . Medicare annual wellness visit, subsequent 01/16/2019  . Ecchymosis 10/22/2018  . Obesity (BMI 30-39.9) 07/09/2018  . HOH (hard of hearing) 03/31/2018  . Uncontrolled type 2 diabetes mellitus with hyperglycemia ([place]) 10/28/2017  . Subclinical hypothyroidism 10/28/2017  . Scoliosis   . Obstructive sleep apnea on CPAP   . Hx of skin cancer, basal cell   . Hyperlipidemia associated with type 2 diabetes mellitus ([place])   . GERD (gastroesophageal reflux disease)   . Chronic upper back pain   . Arthritis   . Allergy   . NSTEMI (non-ST elevated myocardial infarction) ([place]) 06/18/2017  . Actinic keratoses 04/29/2017  . Positive colorectal cancer screening using Cologuard test 10/31/2016  . BPH (benign prostatic hyperplasia) 09/04/2016  . B12 deficiency 11/22/2015  . Routine general medical examination at a health care  facility 08/26/2015  . Abnormal nuclear cardiac imaging test   . Dyspnea on exertion 11/27/2013  . Encounter for examination of normal volunteer in research study 05/29/2013  . Prostate cancer screening 07/15/2012  . CAD S/P percutaneous coronary angioplasty 05/17/2011  . Nonspecific abnormal results of cardiovascular function study 04/30/2011  . Abnormal EKG 04/06/2011  . Right low back pain 01/13/2010  . Insulin dependent diabetes mellitus 10/04/2006  . ERECTILE DYSFUNCTION 10/04/2006  . Essential hypertension 10/04/2006  . Allergic rhinitis 10/04/2006  . OSTEOARTHRITIS 10/04/2006  . Sleep apnea 10/04/2006  . EDEMA 10/04/2006  . SKIN CANCER, HX  OF 10/04/2006    Past Surgical History:  Procedure Laterality Date  . BREAST SURGERY  2009   breast lump removed benign  . CARDIAC CATHETERIZATION N/A 03/15/2015   Procedure: Left Heart Cath and Coronary Angiography;  Surgeon: Belva Crome, MD;  Location: [facility];  Service: Cardiovascular;  Laterality: N/A;  . CORONARY STENT INTERVENTION N/A 06/18/2017   Procedure: CORONARY STENT INTERVENTION;  Surgeon: Jettie Booze, MD;  Location: [facility];  Service: Cardiovascular;  Laterality: N/A;  RCA  . LEFT HEART CATH AND CORONARY ANGIOGRAPHY N/A 06/18/2017   Procedure: LEFT HEART CATH AND CORONARY ANGIOGRAPHY;  Surgeon: Jettie Booze, MD;  Location: [facility];  Service: Cardiovascular;  Laterality: N/A;  . LEFT HEART CATH AND CORONARY ANGIOGRAPHY N/A 04/12/2020   Procedure: LEFT HEART CATH AND CORONARY ANGIOGRAPHY;  Surgeon: Martinique, Peter M, MD;  Location: [facility];  Service: Cardiovascular;  Laterality: N/A;  . ULTRASOUND GUIDANCE FOR VASCULAR ACCESS  06/18/2017   Procedure: Ultrasound Guidance For Vascular Access;  Surgeon: Jettie Booze, MD;  Location: [facility];  Service: Cardiovascular;;  right radial, right femoral       Family History  Problem Relation Age of Onset  . Heart attack Brother   . Colon cancer Neg Hx   . Colon polyps Neg Hx   . Stomach cancer Neg Hx   . Esophageal cancer Neg Hx     Social History   Tobacco Use  . Smoking status: Never Smoker  . Smokeless tobacco: Never Used  Vaping Use  . Vaping Use: Never used  Substance Use Topics  . Alcohol use: No    Alcohol/week: 0.0 standard drinks  . Drug use: No    Home Medications Prior to Admission medications   Medication Sig Start Date End Date Taking? Authorizing Provider  acetaminophen (TYLENOL) 325 MG tablet Take 650 mg by mouth every 6 (six) hours as needed for mild pain or headache.     [provider]  aspirin 81 MG tablet Take 81  mg by mouth daily.    [provider]  Blood Glucose Monitoring Suppl (TRUE METRIX AIR GLUCOSE METER) W/DEVICE KIT 1 kit by Other route 2 (two) times daily. Check blood sugar twice daily and as directed. Dx E11.65 03/17/15   Tower, Wynelle Fanny, MD  cetirizine (ZYRTEC) 10 MG tablet TAKE 1 TABLET EVERY DAY Patient taking differently: Take 10 mg by mouth daily as needed for allergies. 11/14/16   Tower, Wynelle Fanny, MD  Coenzyme Q10 (CO Q 10 PO) Take 200 mg by mouth daily.    [provider]  Cyanocobalamin (VITAMIN B12) 1000 MCG TBCR Take 1,000 mcg by mouth daily. 04/12/20   Cheryln Manly, NP  DROPLET PEN NEEDLES 31G X 6 MM MISC USE ONE TIME DAILY  AT  BEDTIME  WITH  LANTUS 02/10/20   Tower, Wynelle Fanny, MD  fluticasone (FLONASE) 50 MCG/ACT nasal spray Place 1 spray into both nostrils daily as needed for allergies.     [provider]  glipiZIDE (GLUCOTROL) 10 MG tablet TAKE 1 TABLET TWICE DAILY WITH MEALS 08/02/20   Tower, Wynelle Fanny, MD  isosorbide mononitrate (IMDUR) 60 MG 24 hr tablet Take 1 tablet (60 mg total) by mouth daily. 04/18/20   Lelon Perla, MD  LANTUS SOLOSTAR 100 UNIT/ML Solostar Pen INJECT 40 UNITS INTO THE SKIN AT BEDTIME. 08/02/20   Tower, Wynelle Fanny, MD  metFORMIN (GLUCOPHAGE) 1000 MG tablet TAKE 1 TABLET TWICE DAILY Patient taking differently: Take 1,000 mg by mouth 2 (two) times daily with a meal. 03/10/20   Tower, Wynelle Fanny, MD  metoprolol tartrate (LOPRESSOR) 25 MG tablet Take 0.5 tablets (12.5 mg total) by mouth 2 (two) times daily. 05/25/20   Lelon Perla, MD  nitroGLYCERIN (NITROSTAT) 0.4 MG SL tablet Place 1 tablet (0.4 mg total) under the tongue every 5 (five) minutes as needed for chest pain. 05/11/19   [facility], Tami Lin, PA  rosuvastatin (CRESTOR) 40 MG tablet Take 40 mg by mouth daily.  03/10/20   [provider]  TRUE METRIX BLOOD GLUCOSE TEST test strip CHECK BLOOD SUGAR TWICE DAILY 12/15/19   Tower, Wynelle Fanny, MD  TRUEPLUS LANCETS 30G MISC Check  blood sugar twice daily and as directed. Dx E11.65 03/17/15   Abner Greenspan, MD    Allergies    Loratadine and Simvastatin  Review of Systems   Review of Systems  Skin: Positive for wound.  All other systems reviewed and are negative.   Physical Exam Updated Vital Signs BP (!) 180/80   Pulse 79   Temp 98.6 F (37 C) (Oral)   Resp (!) 26   SpO2 100%   Physical Exam Vitals and nursing note reviewed.  Constitutional:      General: He is not in acute distress.    Appearance: He is well-developed and well-nourished.     Comments: NAD.  HENT:     Head: Normocephalic and atraumatic.     Comments: 7 cm straight laceration with bleeding in the center after cleaning wound.  Small underlying hematoma, locally tender.  No other scalp or facial bone tenderness, crepitus.     Right Ear: External ear normal.     Left Ear: External ear normal.     Nose: Nose normal.  Eyes:     General: No scleral icterus.    Extraocular Movements: EOM normal.     Conjunctiva/sclera: Conjunctivae normal.  Neck:     Comments: No midline or paraspinal neck tenderness  Cardiovascular:     Rate and Rhythm: Normal rate and regular rhythm.     Pulses: Intact distal pulses.     Heart sounds: Normal heart sounds. No murmur heard.   Pulmonary:     Effort: Pulmonary effort is normal.     Breath sounds: Normal breath sounds. No wheezing.  Musculoskeletal:        General: No deformity. Normal range of motion.     Cervical back: Normal range of motion and neck supple.  Skin:    General: Skin is warm and dry.     Capillary Refill: Capillary refill takes less than 2 seconds.  Neurological:     Mental Status: He is alert and oriented to person, place, and time.     Comments:   Awake, alert. Speech clear. Sensation to light  touch intact in face, upper/lower extremities. Strength equal and symmetric bilaterally. No arm or leg drop/drift. Normal FTN. CN 2-12 intact. Gait steady  Psychiatric:        Mood and  Affect: Mood and affect normal.        Behavior: Behavior normal.        Thought Content: Thought content normal.        Judgment: Judgment normal.     ED Results / Procedures / Treatments   Labs (all labs ordered are listed, but only abnormal results are displayed) Labs Reviewed - No data to display  EKG None  Radiology CT Head Wo Contrast  Result Date: 08/02/2020 CLINICAL DATA:  Struck by falling tree branch, scalp laceration EXAM: CT HEAD WITHOUT CONTRAST TECHNIQUE: Contiguous axial images were obtained from the base of the skull through the vertex without intravenous contrast. COMPARISON:  None. FINDINGS: Brain: No evidence of acute infarction, hemorrhage, hydrocephalus, extra-axial collection, visible mass lesion or mass effect. Symmetric prominence of the ventricles, cisterns and sulci compatible with mild frontal predominant parenchymal volume loss. Patchy areas of white matter hypoattenuation are most compatible with chronic microvascular angiopathy. Vascular: Atherosclerotic calcification of the carotid siphons and intradural vertebral arteries. No hyperdense vessel. Skull: Soft tissue laceration and swelling is noted towards the frontal vertex just to the right of midline. No subjacent calvarial fracture is seen. No radiodense retained foreign bodies are evident. No suspicious osseous lesions or other acute osseous injuries. Sinuses/Orbits: Mild mural thickening predominately within the ethmoid air cells. No layering air-fluid levels or pneumatized secretions. Mastoid air cells and middle ear cavities are clear. Prior left lens extraction. Included orbits are otherwise unremarkable. Other: Atlantodental arthrosis. IMPRESSION: 1. Soft tissue laceration and swelling of the frontal scalp towards the vertex just to the right of midline. No subjacent calvarial fracture. No radiodense retained foreign bodies are evident. 2. No acute intracranial abnormality. 3. Mild frontal predominant  parenchymal volume loss and chronic microvascular angiopathy changes. Electronically Signed   By: Lovena Le M.D.   On: 08/02/2020 19:04    Procedures .Marland KitchenLaceration Repair  Date/Time: 08/02/2020 7:38 PM Performed by: Kinnie Feil, PA-C Authorized by: Kinnie Feil, PA-C   Consent:    Consent obtained:  Verbal   Consent given by:  Patient   Risks, benefits, and alternatives were discussed: yes     Risks discussed:  Pain, need for additional repair, poor cosmetic result and infection   Alternatives discussed:  Delayed treatment Universal protocol:    Procedure explained and questions answered to patient or proxy's satisfaction: yes     Relevant documents present and verified: yes     Test results available: yes     Imaging studies available: yes     Required blood products, implants, devices, and special equipment available: yes     Immediately prior to procedure, a time out was called: yes     Patient identity confirmed:  Arm band Anesthesia:    Anesthesia method:  None (patient declined local anesthetic) Laceration details:    Location:  Scalp   Scalp location:  Frontal   Length (cm):  7 Pre-procedure details:    Preparation:  Patient was prepped and draped in usual sterile fashion and imaging obtained to evaluate for foreign bodies Exploration:    Limited defect created (wound extended): no     Hemostasis achieved with:  Direct pressure   Imaging obtained comment:  CT   Imaging outcome: foreign body not noted  Wound exploration: entire depth of wound visualized     Wound extent: vascular damage     Wound extent: no foreign bodies/material noted and no underlying fracture noted     Contaminated: no   Treatment:    Area cleansed with:  Povidone-iodine   Amount of cleaning:  Standard   Irrigation method:  Tap   Visualized foreign bodies/material removed: no     Debridement:  None   Undermining:  None Skin repair:    Repair method:  Staples   Number of  staples:  8 Approximation:    Approximation:  Close Repair type:    Repair type:  Simple Post-procedure details:    Dressing:  Bulky dressing   Procedure completion:  Tolerated well, no immediate complications     Medications Ordered in ED Medications - No data to display  ED Course  I have reviewed the triage vital signs and the nursing notes.  Pertinent labs & imaging results that were available during my care of the patient were reviewed by me and considered in my medical decision making (see chart for details).    MDM Rules/Calculators/A&P                           87 y.o. year old male presents with laceration of scalp. Somewhat high risk mechanism of injury although patient has no red flag symptoms or findings on exam. No OAC.   EMR, triage and nursing notes reviewed  Imaging ordered: CT head   Labs ordered: n/a  Medicines ordered: lidocaine/epi for laceration repair, patient refused. Tetanus is UTD.   Personally visualized and interpreted above labs and imaging   Imaging reveals - soft tissue laceration of frontal scalp, no fracture, FB  Labs reveal - n/a   Will ambulate patient prior to discharge. No further emergent work up indicated. Wound care and staple removal instructions given to family. RN to place pressure dressing to prevent large hematoma. Discussed with EDP. Wife and patient in agreement with ER treatment and discharge plan.    Final Clinical Impression(s) / ED Diagnoses Final diagnoses:  Laceration of scalp, initial encounter    Rx / DC Orders ED Discharge Orders    None         Arlean Hopping 08/02/20 1946    Breck Coons, MD 08/02/20 2058

## 2020-08-16 ENCOUNTER — Encounter: Payer: Self-pay | Admitting: Family Medicine

## 2020-08-16 ENCOUNTER — Other Ambulatory Visit: Payer: Self-pay

## 2020-08-16 ENCOUNTER — Ambulatory Visit (INDEPENDENT_AMBULATORY_CARE_PROVIDER_SITE_OTHER): Payer: Medicare HMO | Admitting: Family Medicine

## 2020-08-16 ENCOUNTER — Other Ambulatory Visit: Payer: Self-pay | Admitting: Cardiology

## 2020-08-16 ENCOUNTER — Other Ambulatory Visit: Payer: Self-pay | Admitting: Family Medicine

## 2020-08-16 DIAGNOSIS — Z23 Encounter for immunization: Secondary | ICD-10-CM | POA: Diagnosis not present

## 2020-08-16 DIAGNOSIS — S0990XS Unspecified injury of head, sequela: Secondary | ICD-10-CM | POA: Diagnosis not present

## 2020-08-16 DIAGNOSIS — S0101XD Laceration without foreign body of scalp, subsequent encounter: Secondary | ICD-10-CM

## 2020-08-16 DIAGNOSIS — S0990XA Unspecified injury of head, initial encounter: Secondary | ICD-10-CM | POA: Insufficient documentation

## 2020-08-16 DIAGNOSIS — S0101XA Laceration without foreign body of scalp, initial encounter: Secondary | ICD-10-CM | POA: Insufficient documentation

## 2020-08-16 NOTE — Assessment & Plan Note (Signed)
From object falling (tree)  No s/s of concussion  Laceration healing /no complications  Disc s/s to watch for  Disc safety/hard hat in the future

## 2020-08-16 NOTE — Assessment & Plan Note (Signed)
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

## 2020-08-16 NOTE — Progress Notes (Signed)
Subjective:    Patient ID: Ryan Garcia, male    DOB: 11/18/1933, 87 y.o.   MRN: [id number]   This visit occurred during the SARS-CoV-2 public health emergency.  Safety protocols were in place, including screening questions prior to the visit, additional usage of staff PPE, and extensive cleaning of exam room while observing appropriate contact time as indicated for disinfecting solutions.   HPI Pt presents for f/u of laceration, to remove staples  Wt Readings from Last 3 Encounters:  08/16/20 256 lb 2 oz (116.2 kg)  07/26/20 254 lb (115.2 kg)  07/25/20 252 lb 3.2 oz (114.4 kg)   35.22 kg/m  This occurred on 2/1 and was seen in ER  While he was on a tractor, a log (unbalanced) slipped out of the fork and hit the top of his head (very large)  No LOC or dizzy,nausea or ms change once he got to the ER  7 cm wound closed with staples (8 of them) and he tolerated it well   CT scan CT Head Wo Contrast  Result Date: 08/02/2020 CLINICAL DATA:  Struck by falling tree branch, scalp laceration EXAM: CT HEAD WITHOUT CONTRAST TECHNIQUE: Contiguous axial images were obtained from the base of the skull through the vertex without intravenous contrast. COMPARISON:  None. FINDINGS: Brain: No evidence of acute infarction, hemorrhage, hydrocephalus, extra-axial collection, visible mass lesion or mass effect. Symmetric prominence of the ventricles, cisterns and sulci compatible with mild frontal predominant parenchymal volume loss. Patchy areas of white matter hypoattenuation are most compatible with chronic microvascular angiopathy. Vascular: Atherosclerotic calcification of the carotid siphons and intradural vertebral arteries. No hyperdense vessel. Skull: Soft tissue laceration and swelling is noted towards the frontal vertex just to the right of midline. No subjacent calvarial fracture is seen. No radiodense retained foreign bodies are evident. No suspicious osseous lesions or other acute osseous injuries.  Sinuses/Orbits: Mild mural thickening predominately within the ethmoid air cells. No layering air-fluid levels or pneumatized secretions. Mastoid air cells and middle ear cavities are clear. Prior left lens extraction. Included orbits are otherwise unremarkable. Other: Atlantodental arthrosis. IMPRESSION: 1. Soft tissue laceration and swelling of the frontal scalp towards the vertex just to the right of midline. No subjacent calvarial fracture. No radiodense retained foreign bodies are evident. 2. No acute intracranial abnormality. 3. Mild frontal predominant parenchymal volume loss and chronic microvascular angiopathy changes. Electronically Signed   By: Lovena Le M.D.   On: 08/02/2020 19:04   Had a hard time getting cleaned up after wards No headache  Feels ok  No problems with the wound   Patient Active Problem List   Diagnosis Date Noted   Laceration of skin of scalp 08/16/2020   Poor balance 07/26/2020   Anemia 04/20/2020   Renal mass, right 04/18/2020   Fever 04/14/2020   AKI (acute kidney injury) ([place]) 04/14/2020   Cholangitis 04/14/2020   Unstable angina (HCC)    Chest pain 04/11/2020   Left elbow pain 11/18/2019   Pain and swelling of right lower leg 06/23/2019   Coronary artery disease involving native coronary artery of native heart with angina pectoris ([place]) 01/18/2019   Medicare annual wellness visit, subsequent 01/16/2019   Ecchymosis 10/22/2018   Obesity (BMI 30-39.9) 07/09/2018   HOH (hard of hearing) 03/31/2018   Uncontrolled type 2 diabetes mellitus with hyperglycemia ([place]) 10/28/2017   Subclinical hypothyroidism 10/28/2017   Scoliosis    Obstructive sleep apnea on CPAP    Hx of skin cancer, basal  cell    Hyperlipidemia associated with type 2 diabetes mellitus (HCC)    GERD (gastroesophageal reflux disease)    Chronic upper back pain    Arthritis    Allergy    NSTEMI (non-ST elevated myocardial infarction) ([place]) 06/18/2017   Actinic  keratoses 04/29/2017   Positive colorectal cancer screening using Cologuard test 10/31/2016   BPH (benign prostatic hyperplasia) 09/04/2016   B12 deficiency 11/22/2015   Routine general medical examination at a health care facility 08/26/2015   Abnormal nuclear cardiac imaging test    Dyspnea on exertion 11/27/2013   Encounter for examination of normal volunteer in research study 05/29/2013   Prostate cancer screening 07/15/2012   CAD S/P percutaneous coronary angioplasty 05/17/2011   Nonspecific abnormal results of cardiovascular function study 04/30/2011   Abnormal EKG 04/06/2011   Right low back pain 01/13/2010   Insulin dependent diabetes mellitus 10/04/2006   ERECTILE DYSFUNCTION 10/04/2006   Essential hypertension 10/04/2006   Allergic rhinitis 10/04/2006   OSTEOARTHRITIS 10/04/2006   Sleep apnea 10/04/2006   EDEMA 10/04/2006   SKIN CANCER, HX OF 10/04/2006   Past Medical History:  Diagnosis Date   AC (acromioclavicular) joint bone spurs    lt ankle   Allergy    allergic rhinitis   Anginal pain (HCC)    Arthritis    OA   BPH (benign prostatic hyperplasia)    microwave tx of prostate   CAD (coronary artery disease)    a. s/p cath in 2012 showing 70-75% LAD stenosis, 75% D1 stenosis, 75% OM1, 60-70% RCA stenosis, and 80% PDA --> medical therapy pursued b. similar findings by cath in 2016; 06/2017 DES to distal RCA   Chronic upper back pain    Diabetes mellitus    type II x8 years   GERD (gastroesophageal reflux disease)    HLD (hyperlipidemia)    10/12: TC 208, TG 213, HDL 36, LDL 129   Hx of skin cancer, basal cell    many skin cancers removed/under constant treatment.   Hypertension    NSTEMI (non-ST elevated myocardial infarction) ([place]) 06/18/2017   DES to distal RCA   Obstructive sleep apnea on CPAP    Scoliosis    Past Surgical History:  Procedure Laterality Date   BREAST SURGERY  2009   breast lump removed benign    CARDIAC CATHETERIZATION N/A 03/15/2015   Procedure: Left Heart Cath and Coronary Angiography;  Surgeon: Belva Crome, MD;  Location: [facility];  Service: Cardiovascular;  Laterality: N/A;   CORONARY STENT INTERVENTION N/A 06/18/2017   Procedure: CORONARY STENT INTERVENTION;  Surgeon: Jettie Booze, MD;  Location: [facility];  Service: Cardiovascular;  Laterality: N/A;  RCA   LEFT HEART CATH AND CORONARY ANGIOGRAPHY N/A 06/18/2017   Procedure: LEFT HEART CATH AND CORONARY ANGIOGRAPHY;  Surgeon: Jettie Booze, MD;  Location: [facility];  Service: Cardiovascular;  Laterality: N/A;   LEFT HEART CATH AND CORONARY ANGIOGRAPHY N/A 04/12/2020   Procedure: LEFT HEART CATH AND CORONARY ANGIOGRAPHY;  Surgeon: Martinique, Peter M, MD;  Location: [facility];  Service: Cardiovascular;  Laterality: N/A;   ULTRASOUND GUIDANCE FOR VASCULAR ACCESS  06/18/2017   Procedure: Ultrasound Guidance For Vascular Access;  Surgeon: Jettie Booze, MD;  Location: [facility];  Service: Cardiovascular;;  right radial, right femoral   Social History   Tobacco Use   Smoking status: Never Smoker   Smokeless tobacco: Never Used  Vaping Use  Vaping Use: Never used  Substance Use Topics   Alcohol use: No    Alcohol/week: 0.0 standard drinks   Drug use: No   Family History  Problem Relation Age of Onset   Heart attack Brother    Colon cancer Neg Hx    Colon polyps Neg Hx    Stomach cancer Neg Hx    Esophageal cancer Neg Hx    Allergies  Allergen Reactions   Loratadine     Not effective   Simvastatin     Intolerant, joint pain   Current Outpatient Medications on File Prior to Visit  Medication Sig Dispense Refill   acetaminophen (TYLENOL) 325 MG tablet Take 650 mg by mouth every 6 (six) hours as needed for mild pain or headache.      aspirin 81 MG tablet Take 81 mg by mouth daily.     Blood Glucose Monitoring Suppl (TRUE METRIX AIR  GLUCOSE METER) W/DEVICE KIT 1 kit by Other route 2 (two) times daily. Check blood sugar twice daily and as directed. Dx E11.65 1 kit 0   cetirizine (ZYRTEC) 10 MG tablet TAKE 1 TABLET EVERY DAY (Patient taking differently: Take 10 mg by mouth daily as needed for allergies.) 90 tablet 1   Coenzyme Q10 (CO Q 10 PO) Take 200 mg by mouth daily.     Cyanocobalamin (VITAMIN B12) 1000 MCG TBCR Take 1,000 mcg by mouth daily. 30 tablet    DROPLET PEN NEEDLES 31G X 6 MM MISC USE ONE TIME DAILY  AT  BEDTIME  WITH  LANTUS 100 each 0   fluticasone (FLONASE) 50 MCG/ACT nasal spray Place 1 spray into both nostrils daily as needed for allergies.      glipiZIDE (GLUCOTROL) 10 MG tablet TAKE 1 TABLET TWICE DAILY WITH MEALS 180 tablet 1   isosorbide mononitrate (IMDUR) 60 MG 24 hr tablet Take 1 tablet (60 mg total) by mouth daily. 90 tablet 3   LANTUS SOLOSTAR 100 UNIT/ML Solostar Pen INJECT 40 UNITS INTO THE SKIN AT BEDTIME. 15 mL 1   metoprolol tartrate (LOPRESSOR) 25 MG tablet Take 0.5 tablets (12.5 mg total) by mouth 2 (two) times daily. 90 tablet 3   nitroGLYCERIN (NITROSTAT) 0.4 MG SL tablet Place 1 tablet (0.4 mg total) under the tongue every 5 (five) minutes as needed for chest pain. 25 tablet 3   rosuvastatin (CRESTOR) 40 MG tablet Take 40 mg by mouth daily.      TRUE METRIX BLOOD GLUCOSE TEST test strip CHECK BLOOD SUGAR TWICE DAILY 200 strip 2   TRUEPLUS LANCETS 30G MISC Check blood sugar twice daily and as directed. Dx E11.65 200 each 1   No current facility-administered medications on file prior to visit.    Review of Systems  Constitutional: Negative for activity change, appetite change, fatigue, fever and unexpected weight change.  HENT: Negative for congestion, rhinorrhea, sore throat and trouble swallowing.   Eyes: Negative for pain, redness, itching and visual disturbance.  Respiratory: Negative for cough, chest tightness, shortness of breath and wheezing.   Cardiovascular: Negative  for chest pain and palpitations.  Gastrointestinal: Negative for abdominal pain, blood in stool, constipation, diarrhea and nausea.  Endocrine: Negative for cold intolerance, heat intolerance, polydipsia and polyuria.  Genitourinary: Negative for difficulty urinating, dysuria, frequency and urgency.  Musculoskeletal: Positive for arthralgias and back pain. Negative for joint swelling and myalgias.  Skin: Positive for wound. Negative for pallor and rash.  Neurological: Negative for dizziness, tremors, syncope, facial asymmetry, weakness, light-headedness, numbness  and headaches.  Hematological: Negative for adenopathy. Does not bruise/bleed easily.  Psychiatric/Behavioral: Negative for decreased concentration and dysphoric mood. The patient is not nervous/anxious.        Objective:   Physical Exam Constitutional:      General: He is not in acute distress.    Appearance: Normal appearance. He is obese. He is not ill-appearing.  HENT:     Head: Normocephalic.  Eyes:     General:        Right eye: No discharge.        Left eye: No discharge.     Extraocular Movements: Extraocular movements intact.     Conjunctiva/sclera: Conjunctivae normal.     Pupils: Pupils are equal, round, and reactive to light.  Cardiovascular:     Rate and Rhythm: Normal rate and regular rhythm.     Heart sounds: Normal heart sounds.  Pulmonary:     Effort: Pulmonary effort is normal.     Breath sounds: Normal breath sounds.  Musculoskeletal:     Cervical back: Normal range of motion and neck supple.  Lymphadenopathy:     Cervical: No cervical adenopathy.  Skin:    Coloration: Skin is not pale.     Findings: No erythema.     Comments: Laceration (vertical) mid scalp from hairline - good wound edge approximation without erythema or swelling  Area cleaned , little scab noted  8 staples removed without complication, pt tolerated well  Neurological:     Mental Status: He is alert.     Cranial Nerves: No  cranial nerve deficit.     Sensory: No sensory deficit.     Coordination: Coordination normal.  Psychiatric:        Mood and Affect: Mood normal.           Assessment & Plan:   Problem List Items Addressed This Visit      Other   Laceration of skin of scalp    After being hit in head with tree trunk working in a tractor  Reviewed hospital records, lab results and studies in detail  Repaired in ER uneventfully  No concussive symptoms  Well healed wound w/o infection, 8 staples removed without complication today  Disc wound care Disc accident prev by wearing hard hat while working if he insists on heavy outdoor work Disc head injury s/s to watch for        Relevant Orders   Td : Tetanus/diphtheria >7yo Preservative  free (Completed)   Head injury    From object falling (tree)  No s/s of concussion  Laceration healing /no complications  Disc s/s to watch for  Disc safety/hard hat in the future

## 2020-08-16 NOTE — Patient Instructions (Addendum)
Tetanus shot today  Wound looks great  You can gently clean it with soap or shampoo and water  If any problems, pain ,swelling, headache, dizziness, nausea or confusion- alert [place] and get medical attention   Please be careful when working !  Avoid high risk activities

## 2020-08-25 ENCOUNTER — Telehealth: Payer: Self-pay | Admitting: Cardiology

## 2020-08-25 MED ORDER — ROSUVASTATIN CALCIUM 40 MG PO TABS
40.0000 mg | ORAL_TABLET | Freq: Every day | ORAL | 3 refills | Status: DC
Start: 2020-08-25 — End: 2021-09-13

## 2020-08-25 NOTE — Telephone Encounter (Signed)
*  STAT* If patient is at the pharmacy, call can be transferred to refill team.   1. Which medications need to be refilled? (please list name of each medication and dose if known)  Rosuvastatin  2. Which pharmacy/location (including street and city if local pharmacy) is medication to be sent to? [facility]  3. Do they need a 30 day or 90 day supply? 90 days and refills

## 2020-08-26 ENCOUNTER — Telehealth: Payer: Self-pay

## 2020-08-26 NOTE — Chronic Care Management (AMB) (Addendum)
Chronic Care Management Pharmacy Assistant   Name: Ryan Garcia  MRN: [id number] DOB: April 25, 1934  Reason for Encounter: Initial questions for CCM visit scheduled 08/31/20  PCP : Abner Greenspan, MD  Allergies:   Allergies  Allergen Reactions   Loratadine     Not effective   Simvastatin     Intolerant, joint pain    Medications: Outpatient Encounter Medications as of 08/26/2020  Medication Sig   acetaminophen (TYLENOL) 325 MG tablet Take 650 mg by mouth every 6 (six) hours as needed for mild pain or headache.    aspirin 81 MG tablet Take 81 mg by mouth daily.   Blood Glucose Monitoring Suppl (TRUE METRIX AIR GLUCOSE METER) W/DEVICE KIT 1 kit by Other route 2 (two) times daily. Check blood sugar twice daily and as directed. Dx E11.65   cetirizine (ZYRTEC) 10 MG tablet TAKE 1 TABLET EVERY DAY (Patient taking differently: Take 10 mg by mouth daily as needed for allergies.)   Coenzyme Q10 (CO Q 10 PO) Take 200 mg by mouth daily.   Cyanocobalamin (VITAMIN B12) 1000 MCG TBCR Take 1,000 mcg by mouth daily.   DROPLET PEN NEEDLES 31G X 6 MM MISC USE ONE TIME DAILY  AT  BEDTIME  WITH  LANTUS   fluticasone (FLONASE) 50 MCG/ACT nasal spray Place 1 spray into both nostrils daily as needed for allergies.    glipiZIDE (GLUCOTROL) 10 MG tablet TAKE 1 TABLET TWICE DAILY WITH MEALS   isosorbide mononitrate (IMDUR) 60 MG 24 hr tablet Take 1 tablet (60 mg total) by mouth daily.   LANTUS SOLOSTAR 100 UNIT/ML Solostar Pen INJECT 40 UNITS INTO THE SKIN AT BEDTIME.   metFORMIN (GLUCOPHAGE) 1000 MG tablet Take 1 tablet (1,000 mg total) by mouth 2 (two) times daily with a meal.   metoprolol tartrate (LOPRESSOR) 25 MG tablet Take 0.5 tablets (12.5 mg total) by mouth 2 (two) times daily.   nitroGLYCERIN (NITROSTAT) 0.4 MG SL tablet Place 1 tablet (0.4 mg total) under the tongue every 5 (five) minutes as needed for chest pain.   rosuvastatin (CRESTOR) 40 MG tablet Take 1 tablet (40 mg total) by mouth daily.    TRUE METRIX BLOOD GLUCOSE TEST test strip CHECK BLOOD SUGAR TWICE DAILY   TRUEPLUS LANCETS 30G MISC Check blood sugar twice daily and as directed. Dx E11.65   No facility-administered encounter medications on file as of 08/26/2020.    Current Diagnosis: Patient Active Problem List   Diagnosis Date Noted   Laceration of skin of scalp 08/16/2020   Head injury 08/16/2020   Poor balance 07/26/2020   Anemia 04/20/2020   Renal mass, right 04/18/2020   Fever 04/14/2020   AKI (acute kidney injury) ([place]) 04/14/2020   Cholangitis 04/14/2020   Unstable angina (HCC)    Chest pain 04/11/2020   Left elbow pain 11/18/2019   Pain and swelling of right lower leg 06/23/2019   Coronary artery disease involving native coronary artery of native heart with angina pectoris ([place]) 01/18/2019   Medicare annual wellness visit, subsequent 01/16/2019   Ecchymosis 10/22/2018   Obesity (BMI 30-39.9) 07/09/2018   HOH (hard of hearing) 03/31/2018   Uncontrolled type 2 diabetes mellitus with hyperglycemia ([place]) 10/28/2017   Subclinical hypothyroidism 10/28/2017   Scoliosis    Obstructive sleep apnea on CPAP    Hx of skin cancer, basal cell    Hyperlipidemia associated with type 2 diabetes mellitus (HCC)    GERD (gastroesophageal reflux disease)    Chronic  upper back pain    Arthritis    Allergy    NSTEMI (non-ST elevated myocardial infarction) ([place]) 06/18/2017   Actinic keratoses 04/29/2017   Positive colorectal cancer screening using Cologuard test 10/31/2016   BPH (benign prostatic hyperplasia) 09/04/2016   B12 deficiency 11/22/2015   Routine general medical examination at a health care facility 08/26/2015   Abnormal nuclear cardiac imaging test    Dyspnea on exertion 11/27/2013   Encounter for examination of normal volunteer in research study 05/29/2013   Prostate cancer screening 07/15/2012   CAD S/P percutaneous coronary angioplasty 05/17/2011   Nonspecific abnormal results of cardiovascular  function study 04/30/2011   Abnormal EKG 04/06/2011   Right low back pain 01/13/2010   Insulin dependent diabetes mellitus 10/04/2006   ERECTILE DYSFUNCTION 10/04/2006   Essential hypertension 10/04/2006   Allergic rhinitis 10/04/2006   OSTEOARTHRITIS 10/04/2006   Sleep apnea 10/04/2006   EDEMA 10/04/2006   SKIN CANCER, HX OF 10/04/2006     Have you seen any other providers since your last visit with PCP? No  Any changes in your medications or health? No  Any side effects from any medications? No  Do you have an symptoms or problems not managed by your medications? No  Any concerns about your health right now? Yes- wife states he is not steady on his feet. Eye sight now good.   Has your provider asked that you check blood pressure, blood sugar, or follow special diet at home? Yes checks blood pressure and blood sugar  Do you get any type of exercise on a regular basis? Yes- not formal but active around the house.  Can you think of a goal you would like to reach for your health? Yes- wants to keep being active outside of the house.   Do you have any problems getting your medications? Yes - Insulin expensive.  Patient's preferred pharmacy is:  [facility], [place] [address] Phone: [phone] Fax: [phone]  [id number] [place], [place] - [place] AT [place] [address] [place] [address] Phone: [phone] Fax: [phone]   Is there anything that you would like to discuss during the appointment? Yes- Discuss patient assistance for insulin.   [name] was reminded to have all medications, supplements and any blood glucose and blood pressure readings available for review with Debbora Dus, Pharm. D, at his telephone visit on 08/31/20 at 11:00 AM .   Patients wife states [place] is hard of hearing and she may need to be on the  call with him.   Follow-Up:  Pharmacist Review  Debbora Dus, CPP notified  Margaretmary Dys, Sisters Assistant [phone]  I have reviewed the care management and care coordination activities outlined in this encounter and I am certifying that I agree with the content of this note. No further action required.  Debbora Dus, PharmD Clinical Pharmacist [facility] [phone]

## 2020-08-31 ENCOUNTER — Other Ambulatory Visit: Payer: Self-pay

## 2020-08-31 ENCOUNTER — Telehealth: Payer: Self-pay

## 2020-08-31 ENCOUNTER — Ambulatory Visit (INDEPENDENT_AMBULATORY_CARE_PROVIDER_SITE_OTHER): Payer: Medicare HMO

## 2020-08-31 DIAGNOSIS — I10 Essential (primary) hypertension: Secondary | ICD-10-CM

## 2020-08-31 DIAGNOSIS — E1165 Type 2 diabetes mellitus with hyperglycemia: Secondary | ICD-10-CM

## 2020-08-31 NOTE — Chronic Care Management (AMB) (Signed)
Completed patient assistance application for Lantus and sent to Richardo Priest to print and mail to patient, per patients request. Will follow up with patient 09/09/20 to make sure he received application and help with any questions.    Follow-Up:  Pharmacist Review  Debbora Dus, CPP notified  Margaretmary Dys, [place] [phone]  Total time spent for month: 19

## 2020-08-31 NOTE — Progress Notes (Addendum)
Chronic Care Management Pharmacy Note  08/31/2020 Name:  Ryan Garcia MRN:  [id number] DOB:  [birth year]  Subjective: Ryan Garcia is an 87 y.o. year old male who is a primary patient of Tower, Wynelle Fanny, MD.  The CCM team was consulted for assistance with disease management and care coordination needs.    Engaged with patient by telephone for initial visit in response to provider referral for pharmacy case management and/or care coordination services.   Consent to Services:  The patient was given the following information about Chronic Care Management services today, agreed to services, and gave verbal consent: 1. CCM service includes personalized support from designated clinical staff supervised by the primary care provider, including individualized plan of care and coordination with other care providers 2. 24/7 contact phone numbers for assistance for urgent and routine care needs. 3. Service will only be billed when office clinical staff spend 20 minutes or more in a month to coordinate care. 4. Only one practitioner may furnish and bill the service in a calendar month. 5.The patient may stop CCM services at any time (effective at the end of the month) by phone call to the office staff. 6. The patient will be responsible for cost sharing (co-pay) of up to 20% of the service fee (after annual deductible is met). Patient agreed to services and consent obtained.  Patient Care Team: Tower, Wynelle Fanny, MD as PCP - General Stanford Breed, Denice Bors, MD as PCP - Cardiology (Cardiology) Leandrew Koyanagi, MD as Referring Physician (Ophthalmology) Stanford Breed Denice Bors, MD as Consulting Physician (Cardiology) Dasher, Rayvon Char, MD as Consulting Physician (Dermatology) Salomon Fick., MD as Referring Physician (Dentistry) Debbora Dus, [facility] as Pharmacist (Pharmacist)  Recent office visits: 08/16/20 - PCP - Laceration of scalp from falling object, well healed without infection. Staples removed  today. 07/26/20 - PCP - BP fair control. Continue current meds. A1c 7.3%, Pt does not want to inc lantus beyond 44 u  Plan to continue glipizide 10 mg bid and metformin 1000 mg BID. He would like to work on diet (not good) before inc any medicine. Enc to start checking glucose more often if needed. Very poor balance and falls. Declines PT or assistance devices. You are due for your diabetic eye exam -get it when you can   Recent consult visits: 07/25/20 - Cardiology - CAD - denies chest pain since last office visit.  He continues to to want to avoid coronary artery bypass graft and would like medical therapy.  Continue aspirin, statin and beta-blocker.  Continue nitrates. BP mildly elevated, monitor at home. Continue statin. History of kidney stones and gall stones.  Hospital visits: 08/02/20 - ED - Laceration of scalp, tractor incident   Patient concerns: Reports balance is main concern. Unstable on his feet. Denies any numbness. Occasional tingling in feet. Not using walker or cane. If in yard he will use a walking stick. [place] about a month ago working in the shop. No concerns inside the house.   Objective:  Lab Results  Component Value Date   CREATININE 1.16 04/20/2020   BUN 17 04/20/2020   GFR 57.44 (L) 04/20/2020   GFRNONAA 38 (L) 04/17/2020   GFRAA >60 07/03/2018   NA 138 04/20/2020   K 4.1 04/20/2020   CALCIUM 8.6 04/20/2020   CO2 27 04/20/2020   Lab Results  Component Value Date/Time   HGBA1C 7.3 (A) 07/26/2020 11:47 AM   HGBA1C 7.0 (H) 04/11/2020 06:04 PM   HGBA1C 8.9 (H) 01/18/2020  10:10 AM   GFR 57.44 (L) 04/20/2020 12:25 PM   GFR 60.60 01/18/2020 10:10 AM   MICROALBUR 3.3 (H) 05/09/2010 08:21 AM   MICROALBUR 1.8 11/21/2007 08:46 AM    Last diabetic Eye exam:  Lab Results  Component Value Date/Time   HMDIABEYEEXA No Retinopathy 07/24/2019 12:00 AM    Last diabetic Foot exam:  04/2017 per chart, pt reports checked every time he sees Dr. Glori Bickers  Lab Results  Component  Value Date   CHOL 94 01/18/2020   HDL 31.40 (L) 01/18/2020   LDLCALC 41 01/18/2020   LDLDIRECT 78.5 11/17/2012   TRIG 108.0 01/18/2020   CHOLHDL 3 01/18/2020    Hepatic Function Latest Ref Rng & Units 05/25/2020 04/20/2020 04/20/2020  Total Protein 6.0 - 8.3 g/dL 6.5 6.1 -  Albumin 3.5 - 5.2 g/dL 3.9 3.5 3.5  AST 0 - 37 U/L 17 35 -  ALT 0 - 53 U/L 23 98(H) -  Alk Phosphatase 39 - 117 U/L 78 173(H) -  Total Bilirubin 0.2 - 1.2 mg/dL 0.7 0.8 -  Bilirubin, Direct 0.0 - 0.3 mg/dL 0.2 0.3 -    Lab Results  Component Value Date/Time   TSH 5.58 (H) 01/18/2020 10:10 AM   TSH 4.23 07/17/2019 08:56 AM   FREET4 0.71 01/14/2018 08:57 AM    CBC Latest Ref Rng & Units 04/20/2020 04/17/2020 04/16/2020  WBC 4.0 - 10.5 K/uL 6.1 6.1 6.7  Hemoglobin 13.0 - 17.0 g/dL 11.8 Repeated and verified X2.(L) 10.9(L) 11.6(L)  Hematocrit 39.0 - 52.0 % 35.2(L) 33.9(L) 34.9(L)  Platelets 150.0 - 400.0 K/uL 230.0 140(L) 147(L)    No results found for: VD25OH  Clinical ASCVD: Yes  The ASCVD Risk score Mikey Bussing DC Jr., et al., 2013) failed to calculate for the following reasons:   The 2013 ASCVD risk score is only valid for ages 57 to 16   The patient has a prior MI or stroke diagnosis    Depression screen [facility] 2/9 01/18/2020 01/16/2019 10/15/2017  Decreased Interest 0 0 0  Down, Depressed, Hopeless 0 0 0  PHQ - 2 Score 0 0 0  Altered sleeping - - 0  Tired, decreased energy - - 0  Change in appetite - - 0  Feeling bad or failure about yourself  - - 0  Trouble concentrating - - 0  Moving slowly or fidgety/restless - - 0  Suicidal thoughts - - 0  PHQ-9 Score - - 0  Difficult doing work/chores - - Not difficult at all  Some recent data might be hidden    Social History   Tobacco Use  Smoking Status Never Smoker  Smokeless Tobacco Never Used   BP Readings from Last 3 Encounters:  08/16/20 136/82  08/02/20 (!) 166/70  07/26/20 128/65   Pulse Readings from Last 3 Encounters:  08/16/20 72   08/02/20 72  07/26/20 61   Wt Readings from Last 3 Encounters:  08/16/20 256 lb 2 oz (116.2 kg)  07/26/20 254 lb (115.2 kg)  07/25/20 252 lb 3.2 oz (114.4 kg)    Assessment/Interventions: Review of patient past medical history, allergies, medications, health status, including review of consultants reports, laboratory and other test data, was performed as part of comprehensive evaluation and provision of chronic care management services.   SDOH:  (Social Determinants of Health) assessments and interventions performed: Yes SDOH Interventions   Flowsheet Row Most Recent Value  SDOH Interventions   Financial Strain Interventions Other (Comment)  [Apply for Lantus patient assistance]  CCM Care Plan  Allergies  Allergen Reactions  . Loratadine     Not effective  . Simvastatin     Intolerant, joint pain    Medications Reviewed Today    Reviewed by Debbora Dus, [facility] (Pharmacist) on 08/31/20 at 1158  Med List Status: <None>  Medication Order Taking? Sig Documenting Provider Last Dose Status Informant  acetaminophen (TYLENOL) 325 MG tablet [id number] Yes Take 650 mg by mouth every 6 (six) hours as needed for mild pain or headache.  [provider] Taking Active Multiple Informants  aspirin 81 MG tablet [id number] Yes Take 81 mg by mouth daily. [provider] Taking Active Multiple Informants           Med Note Alfonse Spruce, ANH T   Thu Apr 14, 2020  7:03 PM)    Blood Glucose Monitoring Suppl (TRUE METRIX AIR GLUCOSE METER) W/DEVICE KIT [id number] Yes 1 kit by Other route 2 (two) times daily. Check blood sugar twice daily and as directed. Dx E11.65 Abner Greenspan, MD Taking Active Multiple Informants  cetirizine (ZYRTEC) 10 MG tablet [id number] Yes TAKE 1 TABLET EVERY DAY  Patient taking differently: Take 10 mg by mouth daily as needed for allergies.   Tower, Wynelle Fanny, MD Taking Active   Coenzyme Q10 (CO Q 10 PO) [id number] Yes Take 200 mg by mouth daily. [provider] Taking Active Multiple Informants  Cyanocobalamin (VITAMIN B12) 1000 MCG TBCR [id number] Yes Take 1,000 mcg by mouth daily. Cheryln Manly, NP Taking Active Multiple Informants  DROPLET PEN NEEDLES 31G X 6 MM MISC [id number] Yes USE ONE TIME DAILY  AT  BEDTIME  WITH  LANTUS Tower, Wynelle Fanny, MD Taking Active Multiple Informants  fluticasone (FLONASE) 50 MCG/ACT nasal spray [id number] Yes Place 1 spray into both nostrils daily as needed for allergies.  [provider] Taking Active Multiple Informants  glipiZIDE (GLUCOTROL) 10 MG tablet [id number] Yes TAKE 1 TABLET TWICE DAILY WITH MEALS Tower, Wynelle Fanny, MD Taking Active   isosorbide mononitrate (IMDUR) 60 MG 24 hr tablet [id number] Yes Take 1 tablet (60 mg total) by mouth daily. Lelon Perla, MD Taking Active   LANTUS SOLOSTAR 100 UNIT/ML Solostar Pen [id number] Yes INJECT 40 UNITS INTO THE SKIN AT BEDTIME. Tower, Wynelle Fanny, MD Taking Active   metFORMIN (GLUCOPHAGE) 1000 MG tablet [id number] Yes Take 1 tablet (1,000 mg total) by mouth 2 (two) times daily with a meal. Tower, Wynelle Fanny, MD Taking Active   metoprolol tartrate (LOPRESSOR) 25 MG tablet [id number] Yes Take 0.5 tablets (12.5 mg total) by mouth 2 (two) times daily. Lelon Perla, MD Taking Active   nitroGLYCERIN (NITROSTAT) 0.4 MG SL tablet [id number] Yes Place 1 tablet (0.4 mg total) under the tongue every 5 (five) minutes as needed for chest pain. Ledora Bottcher, PA Taking Active Multiple Informants  rosuvastatin (CRESTOR) 40 MG tablet [id number] Yes Take 1 tablet (40 mg total) by mouth daily. Lelon Perla, MD Taking Active   TRUE METRIX BLOOD GLUCOSE TEST test strip [id number] Yes CHECK BLOOD SUGAR TWICE DAILY Tower, Wynelle Fanny, MD Taking Active Multiple Informants  TRUEPLUS LANCETS 30G [place] [id number] Yes Check blood sugar twice daily and as directed. Dx E11.65 Abner Greenspan, MD Taking Active Multiple Informants          Patient Active Problem List    Diagnosis Date Noted  . Laceration of skin of scalp 08/16/2020  . Head injury 08/16/2020  . Poor balance 07/26/2020  . Anemia  04/20/2020  . Renal mass, right 04/18/2020  . Fever 04/14/2020  . AKI (acute kidney injury) ([place]) 04/14/2020  . Cholangitis 04/14/2020  . Unstable angina ([place])   . Chest pain 04/11/2020  . Left elbow pain 11/18/2019  . Pain and swelling of right lower leg 06/23/2019  . Coronary artery disease involving native coronary artery of native heart with angina pectoris ([place]) 01/18/2019  . Medicare annual wellness visit, subsequent 01/16/2019  . Ecchymosis 10/22/2018  . Obesity (BMI 30-39.9) 07/09/2018  . HOH (hard of hearing) 03/31/2018  . Uncontrolled type 2 diabetes mellitus with hyperglycemia ([place]) 10/28/2017  . Subclinical hypothyroidism 10/28/2017  . Scoliosis   . Obstructive sleep apnea on CPAP   . Hx of skin cancer, basal cell   . Hyperlipidemia associated with type 2 diabetes mellitus ([place])   . GERD (gastroesophageal reflux disease)   . Chronic upper back pain   . Arthritis   . Allergy   . NSTEMI (non-ST elevated myocardial infarction) ([place]) 06/18/2017  . Actinic keratoses 04/29/2017  . Positive colorectal cancer screening using Cologuard test 10/31/2016  . BPH (benign prostatic hyperplasia) 09/04/2016  . B12 deficiency 11/22/2015  . Routine general medical examination at a health care facility 08/26/2015  . Abnormal nuclear cardiac imaging test   . Dyspnea on exertion 11/27/2013  . Encounter for examination of normal volunteer in research study 05/29/2013  . Prostate cancer screening 07/15/2012  . CAD S/P percutaneous coronary angioplasty 05/17/2011  . Nonspecific abnormal results of cardiovascular function study 04/30/2011  . Abnormal EKG 04/06/2011  . Right low back pain 01/13/2010  . Insulin dependent diabetes mellitus 10/04/2006  . ERECTILE DYSFUNCTION 10/04/2006  . Essential hypertension 10/04/2006  . Allergic rhinitis 10/04/2006  .  OSTEOARTHRITIS 10/04/2006  . Sleep apnea 10/04/2006  . EDEMA 10/04/2006  . SKIN CANCER, HX OF 10/04/2006    Immunization History  Administered Date(s) Administered  . Fluad Quad(high Dose 65+) 06/23/2019, 04/20/2020  . Influenza Split 07/09/2011  . Influenza Whole 06/01/2002, 05/22/2007  . Influenza, Seasonal, Injecte, Preservative Fre 07/23/2012  . Influenza,inj,Quad PF,6+ Mos 05/29/2013, 03/01/2014, 08/26/2015, 05/29/2016, 04/29/2017, 05/01/2018  . PFIZER(Purple Top)SARS-COV-2 Vaccination 09/14/2019, 10/05/2019, 06/03/2020  . Pneumococcal Conjugate-13 10/04/2014  . Pneumococcal Polysaccharide-23 06/08/2002, 05/22/2007  . Td 01/22/2001, 08/16/2020  . Tdap 07/09/2011  . Zoster 11/21/2012    Conditions to be addressed/monitored:  Hypertension, Hyperlipidemia, Diabetes, Coronary Artery Disease, GERD and Hypothyroidism  Care Plan : [place]  Updates made by Debbora Dus, [place] since 08/31/2020 12:00 AM    Problem: CHL AMB "PATIENT-SPECIFIC PROBLEM"     Goal: Disease Management   Start Date: 08/31/2020  Priority: High  Note:    Current Barriers:  . Unable to independently afford treatment regimen - Lantus . Elevated blood glucose after supper in 200s . Elevated home blood pressure in 160s  Pharmacist Clinical Goal(s):  Marland Kitchen Over the next 30 days, patient will verbalize ability to afford treatment regimen . Adhere to plan to optimize therapeutic regimen for diabetes as evidenced by report of adherence to recommended medication management changes through collaboration with PharmD and provider.  . Monitor home blood pressure with an arm cuff for the next 2 weeks.  Interventions: . 1:1 collaboration with Tower, Wynelle Fanny, MD regarding development and update of comprehensive plan of care as evidenced by provider attestation and co-signature . Inter-disciplinary care team collaboration (see longitudinal plan of care) . Comprehensive medication review performed; medication  list updated in electronic medical record  Hypertension (BP goal <  150/90) Query-Controlled  -Discusses BP goals, reports cardiology is okay with it being a little higher, PCP wants it around 140. Decided on goal of < 150/90 considering some orthostatic symptoms. -Current treatment: . Metoprolol tartate 25 mg - 1/2 BID (takes after breakfast and after supper) . Isosorbide Mononitrate 60 mg - 1 daily (takes after breakfast) -Medications previously tried: HCTZ, lisinopril - held due to elevated creatinine  -Current home readings: checks every now and then, checked yesterday - 162/70, 72, usually checks seated at table after breakfast or supper.  Checked during office visit today - 161/77, 68. He reports this is a tad high for him. Reports he has a wrist cuff and arm monitor and they run close together.  -Current exercise habits:  [place]ives [place] about 13 acres. Has a 70 foot tree he is cutting limbs off. Using tractor and saw to cut down the logs into smaller pieces. Keeps a fire going in basement about twice a day. 12 steps down and back up. Uses handrail. Mows weekly in the summertime. Mowing takes about 3 hours.  -Denies hypotensive/hypertensive symptoms Stands for about 30 seconds after standing from seated position to gain balance. -Educated on Importance of home blood pressure monitoring; Proper BP monitoring technique; -Counseled to monitor BP at home every other day, document, and provide log at future appointment -Recommended to continue current medication;  Check with arm cuff before breakfast for next 2 weeks. CMA call in 2 weeks to review BP log.  Hyperlipidemia/CAD: (LDL goal < 70) -Controlled -Current treatment: . Rosuvastatin 40 mg - 1 daily . Aspirin 81 mg - 1 daily . CoQ10 - 1 gummy daily  -Medications previously tried: Lipitor (myopathy), pravastatin, simvastatin -Educated on Benefits of statin for ASCVD risk reduction; -Recommended to continue current medication  Diabetes (A1c  goal < 7.5%) -Controlled  -Discussed A1c goal, pt is very motivated and wants his A1c to be less than 7. I think less than 7.5% is a reasonable goal considering age/comorbidities. He is not having any hypoglycemia.  -Current medications: Marland Kitchen Metformin 1000 mg - 1 tablet twice daily meals . Lantus - 44 units bedtime (44 units 6 days a week and 43 units 1 day - in order for 1 pen to last 1 week) . Glipizide 10 mg - 1 tablet twice daily meals -Medications previously tried: none, would avoid GLP-1 due to gallstones -Current home glucose readings: checks before breakfast and 2-4 hours after supper  . fasting glucose: 144, 123, 148, 127, 131, 82, 110, 146, 110, 86, 110, 121, 115, 138, 99, 92, 100, 119 . post prandial glucose: 194, 217, 248, 189, 287, 240, 129, 213, 170, 266, 192, 187, 292, 185, 235, 186, 157, 285, 230, 294, 239 -Denies hypoglycemic/hyperglycemic symptoms -Prefers BG to be under 100 in the morning and under 160 at night.  -Current meal patterns: about once a week goes out to eat and does not watch what he's eating . Sleeps until 8 AM . breakfast: 9 AM eggs and bacon/sausage OR plain cheerios with half a banana, blueberries or oatmeal on occasion, teaspoon of Splenda in cereal and coffee  . Lunch: 2-3 PM "snack lunch", 1 of the following - sardines, vienna sausage, pork n beans, celery with peanut butter/pimento cheese, banana sandwich, slice of cheese, tries to have protein with every meal . Dinner: [employer] chicken livers with cabbage, baked pork chops with onions and potatoes; fish once a week, salmon stew, with cornbread/hush puppies (a couple days per week) . Snacks: ice cream  on occasion, eating 2 oreo cookies or peanut butter on graham cracker most days . Drinks: orange juice - 3 ounces every morning, sugar free and caffeine free soda or tea, water -Educated on: Prevention and management of hypoglycemic episodes; dietary choices with low carbs  -Discussed once a week injection such  as Ozempic. However, due to gallstones, would like to avoid this option. May consider Farxiga in the future. Will work on [facility].  -Counseled to check feet daily and get yearly eye exams -Recommended to continue current medication; CMA to call and review BG log in 2 weeks. Pt to schedule annual eye exam.  Other medications:  Vitamin B12 1000 mcg - 1 daily  Zyrtec 10 mg - 1 daily as needed seasonally  Flonase - as needed seasonally  Nitroglycerin 0.4 mg SL - 1 tablet as needed chest pain, rarely needed, keeps in pocket holder  Tylenol - as needed, last dose about 1 month ago   Patient Goals/Self-Care Activities . Over the next 30 days, patient will:  - take medications as prescribed check glucose twice daily , document, and provide at future appointments check blood pressure 3-5 days this week, document, and provide at future appointments  Follow Up Plan: The care management team will reach out to the patient again over the next 14 days.       Medication Assistance: Application for Lantus  medication assistance program. in process.  Anticipated assistance start date pending patient bringing form back to office.  See plan of care for additional detail. Reports Lantus makes their income tight. Open to applying for assistance.  Patient's preferred pharmacy is:  [facility], [address] [place] [address] Phone: [phone] Fax: [phone]  [id number] [place], [place] - [address] AT [address] [place] [address] Phone: [phone] Fax: [phone]  He gets all his medications from [facility], [address]. Reports a couple of his meds are off sync due to med changes (cut from 1 tablet to 1/2), has excess supply.  Uses pill box? No - uses a sticker system to know which ones are once daily or twice daily Pt endorses good compliance, < 1 dose per  month missed on a busy day  We discussed: Current pharmacy is preferred with insurance plan and patient is satisfied with pharmacy services Patient decided to: Continue current medication management strategy  Care Plan and Follow Up Patient Decision:  Patient agrees to Care Plan and Follow-up.  Plan: The care management team will reach out to the patient again over the next 14 days. Review BP and BG log and ensure patient received PAP application for Lantus.   Debbora Dus, PharmD Clinical Pharmacist [facility] [phone]    I have personally reviewed this encounter including the documentation in this note and have collaborated with the care management provider regarding care management and care coordination activities to include development and update of the comprehensive care plan. I am certifying that I agree with the content of this note and encounter as supervising physician.   Loura Pardon MD

## 2020-08-31 NOTE — Telephone Encounter (Signed)
Patient would like [place] to mail his Lantus PAP application and he will bring into office when completed. Please fill out basic information in form and send to front desk for printing and mail to patient. Thanks!  Debbora Dus, PharmD Clinical Pharmacist [facility] [phone]

## 2020-08-31 NOTE — Patient Instructions (Signed)
August 31, 2020  Dear Ryan Garcia,  It was a pleasure meeting you during our initial appointment on August 31, 2020. Below is a summary of the goals we discussed and components of chronic care management. Please contact me anytime with questions or concerns.   Visit Information  Patient Care Plan: CCM Pharmacy Care Plan    Problem Identified: CH[facility] AMB "PATIENT-SPECIFIC PROBLEM"     Goal: Disease Management   Start Date: 08/31/2020  Priority: High  Note:    Current Barriers:  . Unable to independently afford treatment regimen - Lantus . Elevated blood glucose after supper in 200s . Elevated home blood pressure in 160s  Pharmacist Clinical Goal(s):  Marland Kitchen Over the next 30 days, patient will verbalize ability to afford treatment regimen . Adhere to plan to optimize therapeutic regimen for diabetes as evidenced by report of adherence to recommended medication management changes through collaboration with PharmD and provider.  . Monitor home blood pressure with an arm cuff for the next 2 weeks.  Interventions: . 1:1 collaboration with Tower, Wynelle Fanny, MD regarding development and update of comprehensive plan of care as evidenced by provider attestation and co-signature . Inter-disciplinary care team collaboration (see longitudinal plan of care) . Comprehensive medication review performed; medication list updated in electronic medical record  Hypertension (BP goal < 150/90) Query-Controlled  -Discusses BP goals, reports cardiology is okay with it being a little higher, PCP wants it around 140. Decided on goal of < 150/90 considering some orthostatic symptoms. -Current treatment: . Metoprolol tartate 25 mg - 1/2 BID (takes after breakfast and after supper) . Isosorbide Mononitrate 60 mg - 1 daily (takes after breakfast) -Medications previously tried: HCTZ, lisinopril - held due to elevated creatinine  -Current home readings: checks every now and then, checked yesterday - 162/70, 72, usually  checks seated at table after breakfast or supper.  Checked during office visit today - 161/77, 68. He reports this is a tad high for him. Reports he has a wrist cuff and arm monitor and they run close together.  -Current exercise habits:  [place] about [place]. Has a 70 foot tree he is cutting limbs off. Using tractor and saw to cut down the logs into smaller pieces. Keeps a fire going in basement about twice a day. 12 steps down and back up. Uses handrail. Mows weekly in the summertime. Mowing takes about 3 hours.  -Denies hypotensive/hypertensive symptoms Stands for about 30 seconds after standing from seated position to gain balance. -Educated on Importance of home blood pressure monitoring; Proper BP monitoring technique; -Counseled to monitor BP at home every other day, document, and provide log at future appointment -Recommended to continue current medication;  Check with arm cuff before breakfast for next 2 weeks. CMA call in 2 weeks to review BP log.  Hyperlipidemia/CAD: (LDL goal < 70) -Controlled -Current treatment: . Rosuvastatin 40 mg - 1 daily . Aspirin 81 mg - 1 daily . CoQ10 - 1 gummy daily  -Medications previously tried: Lipitor (myopathy), pravastatin, simvastatin -Educated on Benefits of statin for ASCVD risk reduction; -Recommended to continue current medication  Diabetes (A1c goal < 7.5%) -Controlled  -Discussed A1c goal, pt is very motivated and wants his A1c to be less than 7. I think less than 7.5% is a reasonable goal considering age/comorbidities. He is not having any hypoglycemia.  -Current medications: Marland Kitchen Metformin 1000 mg - 1 tablet twice daily meals . Lantus - 44 units bedtime (44 units 6 days a week  and 43 units 1 day - in order for 1 pen to last 1 week) . Glipizide 10 mg - 1 tablet twice daily meals -Medications previously tried: none, would avoid GLP-1 due to gallstones -Current home glucose readings: checks before breakfast and 2-4 hours after supper   . fasting glucose: 144, 123, 148, 127, 131, 82, 110, 146, 110, 86, 110, 121, 115, 138, 99, 92, 100, 119 . post prandial glucose: 194, 217, 248, 189, 287, 240, 129, 213, 170, 266, 192, 187, 292, 185, 235, 186, 157, 285, 230, 294, 239 -Denies hypoglycemic/hyperglycemic symptoms -Prefers BG to be under 100 in the morning and under 160 at night.  -Current meal patterns: about once a week goes out to eat and does not watch what he's eating . Sleeps until 8 AM . breakfast: 9 AM eggs and bacon/sausage OR plain cheerios with half a banana, blueberries or oatmeal on occasion, teaspoon of Splenda in cereal and coffee  . Lunch: 2-3 PM "snack lunch", 1 of the following - sardines, vienna sausage, pork n beans, celery with peanut butter/pimento cheese, banana sandwich, slice of cheese, tries to have protein with every meal . Dinner: [employer] chicken livers with cabbage, baked pork chops with onions and potatoes; fish once a week, salmon stew, with cornbread/hush puppies (a couple days per week) . Snacks: ice cream on occasion, eating 2 oreo cookies or peanut butter on graham cracker most days . Drinks: orange juice - 3 ounces every morning, sugar free and caffeine free soda or tea, water -Educated on: Prevention and management of hypoglycemic episodes; dietary choices with low carbs  -Discussed once a week injection such as Ozempic. However, due to gallstones, would like to avoid this option. May consider Farxiga in the future. Will work on [facility].  -Counseled to check feet daily and get yearly eye exams -Recommended to continue current medication; CMA to call and review BG log in 2 weeks. Pt to schedule annual eye exam.  Other medications:  Vitamin B12 1000 mcg - 1 daily  Zyrtec 10 mg - 1 daily as needed seasonally  Flonase - as needed seasonally  Nitroglycerin 0.4 mg SL - 1 tablet as needed chest pain, rarely needed, keeps in pocket holder  Tylenol - as needed, last dose about 1 month ago    Patient Goals/Self-Care Activities . Over the next 30 days, patient will:  - take medications as prescribed check glucose twice daily , document, and provide at future appointments check blood pressure 3-5 days this week, document, and provide at future appointments  Follow Up Plan: The care management team will reach out to the patient again over the next 14 days.       Ryan Garcia was given information about Chronic Care Management services today including:  1. CCM service includes personalized support from designated clinical staff supervised by his physician, including individualized plan of care and coordination with other care providers 2. 24/7 contact phone numbers for assistance for urgent and routine care needs. 3. Standard insurance, coinsurance, copays and deductibles apply for chronic care management only during months in which we provide at least 20 minutes of these services. Most insurances cover these services at 100%, however patients may be responsible for any copay, coinsurance and/or deductible if applicable. This service may help you avoid the need for more expensive face-to-face services. 4. Only one practitioner may furnish and bill the service in a calendar month. 5. The patient may stop CCM services at any time (effective at the end  of the month) by phone call to the office staff.  Patient agreed to services and verbal consent obtained.   The patient verbalized understanding of instructions, educational materials, and care plan provided today and agreed to receive a mailed copy of patient instructions, educational materials, and care plan.  The pharmacy team will reach out to the patient again over the next 14 days.   Debbora Dus, PharmD Clinical Pharmacist [facility] [phone]   [email or web address].org/docs/default-source/living-[email or web address]-diabetes/conquering-the-grocery-store-v1.pdf?[email or web address]">  Carbohydrate Counting for  Diabetes Mellitus, Adult Carbohydrate counting is a method of keeping track of how many carbohydrates you eat. Eating carbohydrates naturally increases the amount of sugar (glucose) in the blood. Counting how many carbohydrates you eat improves your blood glucose control, which helps you manage your diabetes. It is important to know how many carbohydrates you can safely have in each meal. This is different for every person. A dietitian can help you make a meal plan and calculate how many carbohydrates you should have at each meal and snack. What foods contain carbohydrates? Carbohydrates are found in the following foods:  Grains, such as breads and cereals.  Dried beans and soy products.  Starchy vegetables, such as potatoes, peas, and corn.  Fruit and fruit juices.  Milk and yogurt.  Sweets and snack foods, such as cake, cookies, candy, chips, and soft drinks.   How do I count carbohydrates in foods? There are two ways to count carbohydrates in food. You can read food labels or learn standard serving sizes of foods. You can use either of the methods or a combination of both. Using the Nutrition Facts label The Nutrition Facts list is included on the labels of almost all packaged foods and beverages in the [place] It includes:  The serving size.  Information about nutrients in each serving, including the grams (g) of carbohydrate per serving. To use the Nutrition Facts:  Decide how many servings you will have.  Multiply the number of servings by the number of carbohydrates per serving.  The resulting number is the total amount of carbohydrates that you will be having. Learning the standard serving sizes of foods When you eat carbohydrate foods that are not packaged or do not include Nutrition Facts on the label, you need to measure the servings in order to count the amount of carbohydrates.  Measure the foods that you will eat with a food scale or measuring cup, if needed.  Decide  how many standard-size servings you will eat.  Multiply the number of servings by 15. For foods that contain carbohydrates, one serving equals 15 g of carbohydrates. ? For example, if you eat 2 cups or 10 oz (300 g) of strawberries, you will have eaten 2 servings and 30 g of carbohydrates (2 servings x 15 g = 30 g).  For foods that have more than one food mixed, such as soups and casseroles, you must count the carbohydrates in each food that is included. The following list contains standard serving sizes of common carbohydrate-rich foods. Each of these servings has about 15 g of carbohydrates:  1 slice of bread.  1 six-inch (15 cm) tortilla.  ? cup or 2 oz (53 g) cooked rice or pasta.   cup or 3 oz (85 g) cooked or canned, drained and rinsed beans or lentils.   cup or 3 oz (85 g) starchy vegetable, such as peas, corn, or squash.   cup or 4 oz (120 g) hot cereal.   cup or 3 oz (85  g) boiled or mashed potatoes, or  or 3 oz (85 g) of a large baked potato.   cup or 4 fl oz (118 mL) fruit juice.  1 cup or 8 fl oz (237 mL) milk.  1 small or 4 oz (106 g) apple.   or 2 oz (63 g) of a medium banana.  1 cup or 5 oz (150 g) strawberries.  3 cups or 1 oz (24 g) popped popcorn. What is an example of carbohydrate counting? To calculate the number of carbohydrates in this sample meal, follow the steps shown below. Sample meal  3 oz (85 g) chicken breast.  ? cup or 4 oz (106 g) brown rice.   cup or 3 oz (85 g) corn.  1 cup or 8 fl oz (237 mL) milk.  1 cup or 5 oz (150 g) strawberries with sugar-free whipped topping. Carbohydrate calculation 1. Identify the foods that contain carbohydrates: ? Rice. ? Corn. ? Milk. ? Strawberries. 2. Calculate how many servings you have of each food: ? 2 servings rice. ? 1 serving corn. ? 1 serving milk. ? 1 serving strawberries. 3. Multiply each number of servings by 15 g: ? 2 servings rice x 15 g = 30 g. ? 1 serving corn x 15 g = 15  g. ? 1 serving milk x 15 g = 15 g. ? 1 serving strawberries x 15 g = 15 g. 4. Add together all of the amounts to find the total grams of carbohydrates eaten: ? 30 g + 15 g + 15 g + 15 g = 75 g of carbohydrates total. What are tips for following this plan? Shopping  Develop a meal plan and then make a shopping list.  Buy fresh and frozen vegetables, fresh and frozen fruit, dairy, eggs, beans, lentils, and whole grains.  Look at food labels. Choose foods that have more fiber and less sugar.  Avoid processed foods and foods with added sugars. Meal planning  Aim to have the same amount of carbohydrates at each meal and for each snack time.  Plan to have regular, balanced meals and snacks. Where to find more information  American Diabetes Association: www.diabetes.org  Centers for Disease Control and Prevention: http://www.wolf.info/ Summary  Carbohydrate counting is a method of keeping track of how many carbohydrates you eat.  Eating carbohydrates naturally increases the amount of sugar (glucose) in the blood.  Counting how many carbohydrates you eat improves your blood glucose control, which helps you manage your diabetes.  A dietitian can help you make a meal plan and calculate how many carbohydrates you should have at each meal and snack. This information is not intended to replace advice given to you by your health care provider. Make sure you discuss any questions you have with your health care provider. Document Revised: 06/18/2019 Document Reviewed: 06/19/2019 Elsevier Patient Education  2021 [employer].

## 2020-09-08 ENCOUNTER — Telehealth: Payer: Self-pay | Admitting: Family Medicine

## 2020-09-08 NOTE — Telephone Encounter (Signed)
Opened in error

## 2020-09-09 DIAGNOSIS — H2511 Age-related nuclear cataract, right eye: Secondary | ICD-10-CM | POA: Diagnosis not present

## 2020-09-09 DIAGNOSIS — H35372 Puckering of macula, left eye: Secondary | ICD-10-CM | POA: Diagnosis not present

## 2020-09-09 DIAGNOSIS — Z0100 Encounter for examination of eyes and vision without abnormal findings: Secondary | ICD-10-CM | POA: Diagnosis not present

## 2020-09-19 DIAGNOSIS — H26492 Other secondary cataract, left eye: Secondary | ICD-10-CM | POA: Diagnosis not present

## 2020-09-28 NOTE — Chronic Care Management (AMB) (Addendum)
Contacted [employer]  to follow up on patient assistance application for Lantus. Per representative at [employer] they have not received any portion of the application.    Follow-Up:  Patient Assistance Coordination and Pharmacist Review  Debbora Dus, CPP notified  Margaretmary Dys, [facility] Assistant [phone]

## 2020-10-06 ENCOUNTER — Other Ambulatory Visit: Payer: Self-pay | Admitting: Family Medicine

## 2020-10-11 ENCOUNTER — Inpatient Hospital Stay (HOSPITAL_COMMUNITY)
Admission: EM | Admit: 2020-10-11 | Discharge: 2020-10-15 | DRG: 310 | Disposition: A | Discharge status: Home / Self Care | Payer: Medicare HMO | Attending: Internal Medicine | Admitting: Internal Medicine

## 2020-10-11 ENCOUNTER — Emergency Department (HOSPITAL_COMMUNITY): Payer: Medicare HMO

## 2020-10-11 ENCOUNTER — Telehealth: Payer: Self-pay | Admitting: Family Medicine

## 2020-10-11 DIAGNOSIS — G4733 Obstructive sleep apnea (adult) (pediatric): Secondary | ICD-10-CM | POA: Diagnosis present

## 2020-10-11 DIAGNOSIS — I340 Nonrheumatic mitral (valve) insufficiency: Secondary | ICD-10-CM | POA: Diagnosis not present

## 2020-10-11 DIAGNOSIS — I4892 Unspecified atrial flutter: Secondary | ICD-10-CM

## 2020-10-11 DIAGNOSIS — E1169 Type 2 diabetes mellitus with other specified complication: Secondary | ICD-10-CM | POA: Diagnosis not present

## 2020-10-11 DIAGNOSIS — H9190 Unspecified hearing loss, unspecified ear: Secondary | ICD-10-CM | POA: Diagnosis present

## 2020-10-11 DIAGNOSIS — I2510 Atherosclerotic heart disease of native coronary artery without angina pectoris: Secondary | ICD-10-CM

## 2020-10-11 DIAGNOSIS — M419 Scoliosis, unspecified: Secondary | ICD-10-CM | POA: Diagnosis present

## 2020-10-11 DIAGNOSIS — I200 Unstable angina: Secondary | ICD-10-CM

## 2020-10-11 DIAGNOSIS — Z20822 Contact with and (suspected) exposure to covid-19: Secondary | ICD-10-CM | POA: Diagnosis not present

## 2020-10-11 DIAGNOSIS — I10 Essential (primary) hypertension: Secondary | ICD-10-CM | POA: Diagnosis not present

## 2020-10-11 DIAGNOSIS — J9811 Atelectasis: Secondary | ICD-10-CM | POA: Diagnosis not present

## 2020-10-11 DIAGNOSIS — K449 Diaphragmatic hernia without obstruction or gangrene: Secondary | ICD-10-CM | POA: Diagnosis present

## 2020-10-11 DIAGNOSIS — Z7984 Long term (current) use of oral hypoglycemic drugs: Secondary | ICD-10-CM

## 2020-10-11 DIAGNOSIS — I252 Old myocardial infarction: Secondary | ICD-10-CM

## 2020-10-11 DIAGNOSIS — Z9861 Coronary angioplasty status: Secondary | ICD-10-CM | POA: Diagnosis not present

## 2020-10-11 DIAGNOSIS — I4891 Unspecified atrial fibrillation: Secondary | ICD-10-CM | POA: Diagnosis not present

## 2020-10-11 DIAGNOSIS — Z951 Presence of aortocoronary bypass graft: Secondary | ICD-10-CM

## 2020-10-11 DIAGNOSIS — I480 Paroxysmal atrial fibrillation: Secondary | ICD-10-CM | POA: Diagnosis not present

## 2020-10-11 DIAGNOSIS — Z6833 Body mass index (BMI) 33.0-33.9, adult: Secondary | ICD-10-CM

## 2020-10-11 DIAGNOSIS — E1165 Type 2 diabetes mellitus with hyperglycemia: Secondary | ICD-10-CM | POA: Diagnosis not present

## 2020-10-11 DIAGNOSIS — K219 Gastro-esophageal reflux disease without esophagitis: Secondary | ICD-10-CM | POA: Diagnosis present

## 2020-10-11 DIAGNOSIS — I493 Ventricular premature depolarization: Secondary | ICD-10-CM | POA: Diagnosis present

## 2020-10-11 DIAGNOSIS — Z888 Allergy status to other drugs, medicaments and biological substances status: Secondary | ICD-10-CM

## 2020-10-11 DIAGNOSIS — R072 Precordial pain: Secondary | ICD-10-CM

## 2020-10-11 DIAGNOSIS — I484 Atypical atrial flutter: Secondary | ICD-10-CM | POA: Diagnosis not present

## 2020-10-11 DIAGNOSIS — I361 Nonrheumatic tricuspid (valve) insufficiency: Secondary | ICD-10-CM | POA: Diagnosis not present

## 2020-10-11 DIAGNOSIS — I313 Pericardial effusion (noninflammatory): Secondary | ICD-10-CM | POA: Diagnosis not present

## 2020-10-11 DIAGNOSIS — R079 Chest pain, unspecified: Secondary | ICD-10-CM | POA: Diagnosis not present

## 2020-10-11 DIAGNOSIS — I2699 Other pulmonary embolism without acute cor pulmonale: Secondary | ICD-10-CM | POA: Diagnosis not present

## 2020-10-11 DIAGNOSIS — E785 Hyperlipidemia, unspecified: Secondary | ICD-10-CM | POA: Diagnosis present

## 2020-10-11 DIAGNOSIS — Z79899 Other long term (current) drug therapy: Secondary | ICD-10-CM

## 2020-10-11 DIAGNOSIS — D649 Anemia, unspecified: Secondary | ICD-10-CM | POA: Diagnosis present

## 2020-10-11 DIAGNOSIS — Z872 Personal history of diseases of the skin and subcutaneous tissue: Secondary | ICD-10-CM

## 2020-10-11 DIAGNOSIS — Z7982 Long term (current) use of aspirin: Secondary | ICD-10-CM | POA: Diagnosis not present

## 2020-10-11 DIAGNOSIS — M1990 Unspecified osteoarthritis, unspecified site: Secondary | ICD-10-CM | POA: Diagnosis present

## 2020-10-11 DIAGNOSIS — I491 Atrial premature depolarization: Secondary | ICD-10-CM | POA: Diagnosis present

## 2020-10-11 DIAGNOSIS — I700 Atherosclerosis of aorta: Secondary | ICD-10-CM | POA: Diagnosis present

## 2020-10-11 DIAGNOSIS — I081 Rheumatic disorders of both mitral and tricuspid valves: Secondary | ICD-10-CM | POA: Diagnosis not present

## 2020-10-11 DIAGNOSIS — R0902 Hypoxemia: Secondary | ICD-10-CM | POA: Diagnosis not present

## 2020-10-11 DIAGNOSIS — R911 Solitary pulmonary nodule: Secondary | ICD-10-CM | POA: Diagnosis not present

## 2020-10-11 DIAGNOSIS — N400 Enlarged prostate without lower urinary tract symptoms: Secondary | ICD-10-CM | POA: Diagnosis present

## 2020-10-11 DIAGNOSIS — I25119 Atherosclerotic heart disease of native coronary artery with unspecified angina pectoris: Secondary | ICD-10-CM | POA: Diagnosis not present

## 2020-10-11 DIAGNOSIS — R0789 Other chest pain: Secondary | ICD-10-CM | POA: Diagnosis not present

## 2020-10-11 DIAGNOSIS — Z955 Presence of coronary angioplasty implant and graft: Secondary | ICD-10-CM

## 2020-10-11 DIAGNOSIS — Z8249 Family history of ischemic heart disease and other diseases of the circulatory system: Secondary | ICD-10-CM

## 2020-10-11 DIAGNOSIS — I517 Cardiomegaly: Secondary | ICD-10-CM | POA: Diagnosis not present

## 2020-10-11 DIAGNOSIS — R000 Tachycardia, unspecified: Secondary | ICD-10-CM | POA: Diagnosis not present

## 2020-10-11 HISTORY — DX: Chronic kidney disease, unspecified: N18.9

## 2020-10-11 LAB — CBG MONITORING, ED
Glucose-Capillary: 178 mg/dL — ABNORMAL HIGH (ref 70–99)
Glucose-Capillary: 170 mg/dL — ABNORMAL HIGH (ref 70–99)

## 2020-10-11 LAB — COMPREHENSIVE METABOLIC PANEL
ALT: 14 U/L (ref 0–44)
AST: 30 U/L (ref 15–41)
Albumin: 2.8 g/dL — ABNORMAL LOW (ref 3.5–5.0)
Alkaline Phosphatase: 54 U/L (ref 38–126)
Anion gap: 4 — ABNORMAL LOW (ref 5–15)
BUN: 20 mg/dL (ref 8–23)
CO2: 21 mmol/L — ABNORMAL LOW (ref 22–32)
Calcium: 7.4 mg/dL — ABNORMAL LOW (ref 8.9–10.3)
Chloride: 115 mmol/L — ABNORMAL HIGH (ref 98–111)
Creatinine, Ser: 1.01 mg/dL (ref 0.61–1.24)
GFR, Estimated: >60 mL/min (ref >60)
Glucose, Bld: 175 mg/dL — ABNORMAL HIGH (ref 70–99)
Potassium: 4.5 mmol/L (ref 3.5–5.1)
Sodium: 140 mmol/L (ref 135–145)
Total Bilirubin: 0.7 mg/dL (ref 0.3–1.2)
Total Protein: 4.9 g/dL — ABNORMAL LOW (ref 6.5–8.1)

## 2020-10-11 LAB — CBC WITH DIFFERENTIAL/PLATELET
Abs Immature Granulocytes: 0.02 10*3/uL (ref 0.00–0.07)
Basophils Absolute: 0 10*3/uL (ref 0.0–0.1)
Basophils Relative: 1 %
Eosinophils Absolute: 0.2 10*3/uL (ref 0.0–0.5)
Eosinophils Relative: 3 %
HCT: 35.9 % — ABNORMAL LOW (ref 39.0–52.0)
Hemoglobin: 11.8 g/dL — ABNORMAL LOW (ref 13.0–17.0)
Immature Granulocytes: 0 %
Lymphocytes Relative: 20 %
Lymphs Abs: 1.3 10*3/uL (ref 0.7–4.0)
MCH: 31 pg (ref 26.0–34.0)
MCHC: 32.9 g/dL (ref 30.0–36.0)
MCV: 94.2 fL (ref 80.0–100.0)
Monocytes Absolute: 0.7 10*3/uL (ref 0.1–1.0)
Monocytes Relative: 11 %
Neutro Abs: 4.2 10*3/uL (ref 1.7–7.7)
Neutrophils Relative %: 65 %
Platelets: 156 10*3/uL (ref 150–400)
RBC: 3.81 MIL/uL — ABNORMAL LOW (ref 4.22–5.81)
RDW: 15.8 % — ABNORMAL HIGH (ref 11.5–15.5)
WBC: 6.5 10*3/uL (ref 4.0–10.5)
nRBC: 0 % (ref 0.0–0.2)

## 2020-10-11 LAB — TROPONIN I (HIGH SENSITIVITY)
Troponin I (High Sensitivity): 8 ng/L (ref <18)
Troponin I (High Sensitivity): 14 ng/L (ref <18)

## 2020-10-11 IMAGING — DX DG CHEST 1V PORT
1 series · 1 of 1 positions shown · non-contrast
Comparison: [DATE]

CLINICAL DATA: Chest pain

EXAM:
PORTABLE CHEST 1 VIEW

[chest ap]
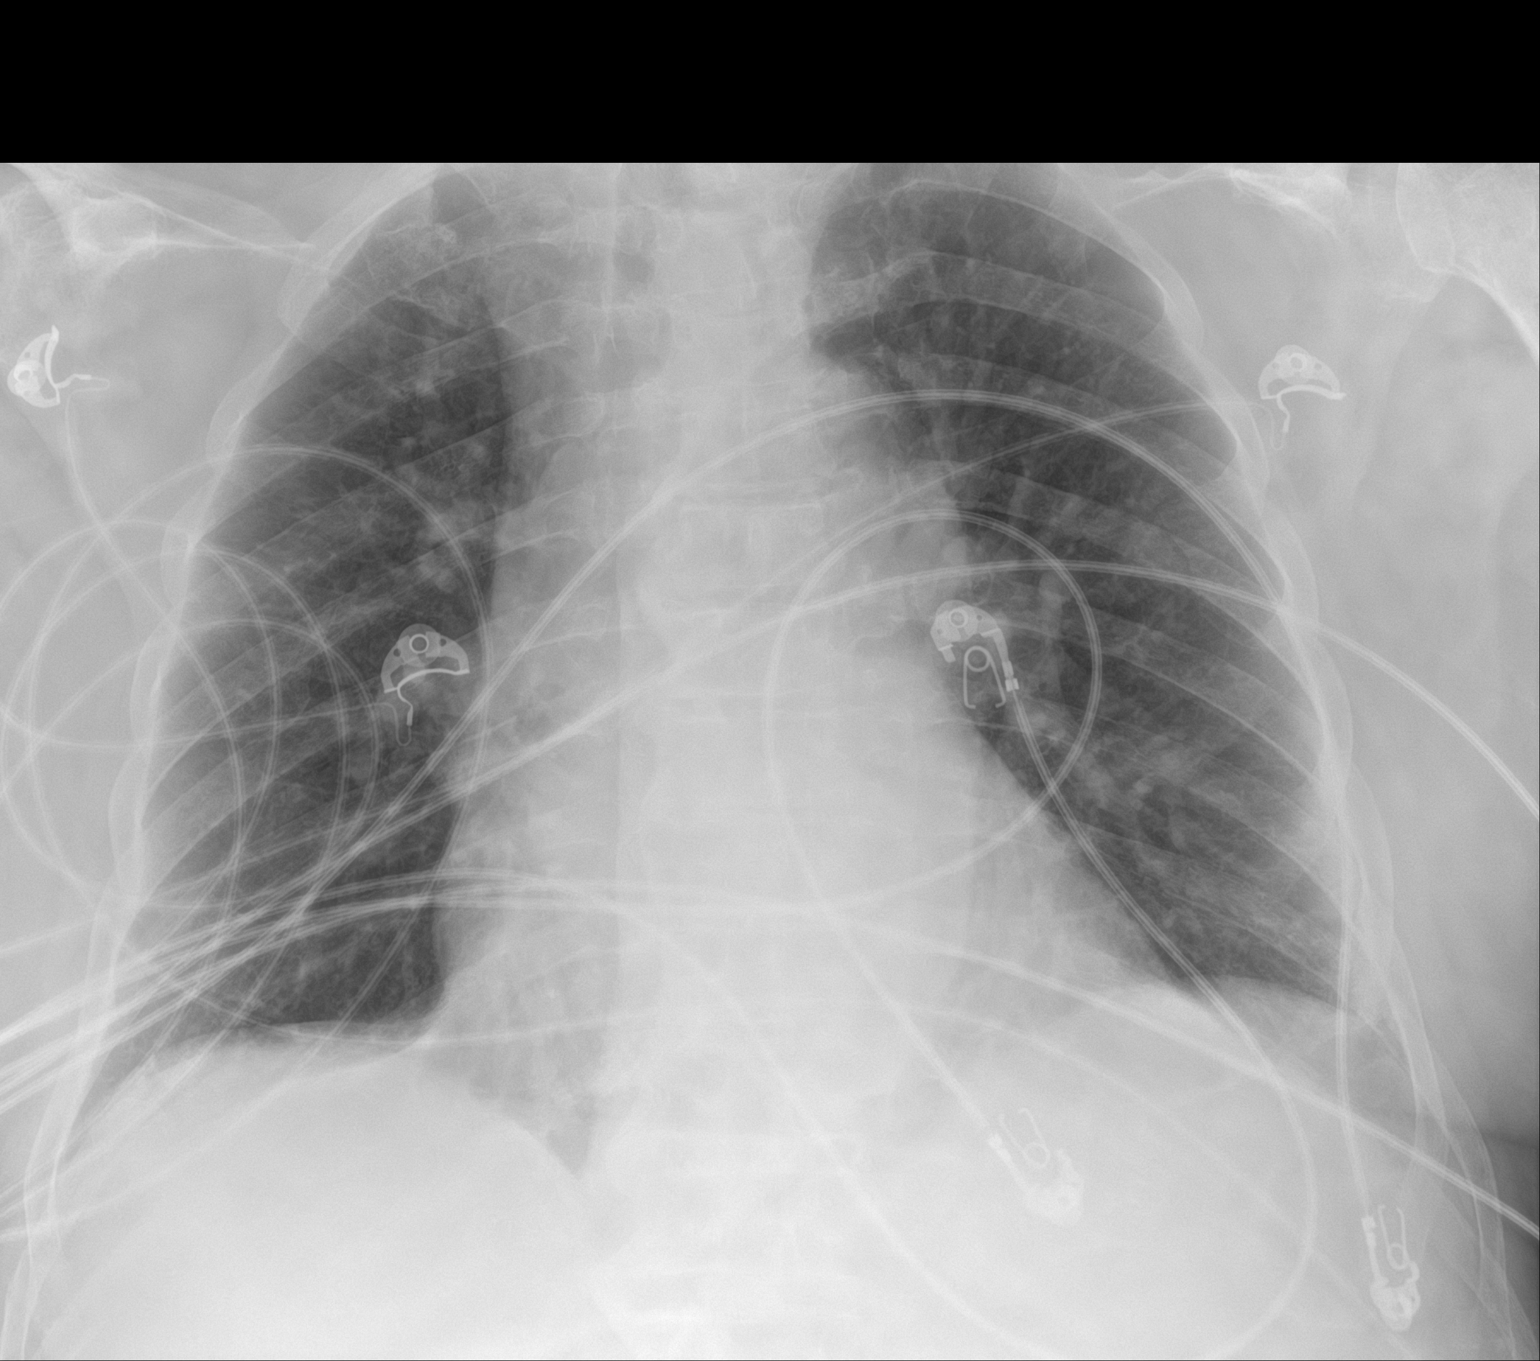

[1 of 1 positions shown; findings below may reference images not displayed]

FINDINGS: Borderline cardiomegaly. No focal airspace disease or effusion.
Aortic atherosclerosis. No pneumothorax.
IMPRESSION: No active disease. Borderline cardiomegaly.

## 2020-10-11 MED ORDER — ROSUVASTATIN CALCIUM 20 MG PO TABS
40.0000 mg | ORAL_TABLET | Freq: Every day | ORAL | Status: DC
  Administered 2020-10-11 – 2020-10-14 (×4): 40 mg via ORAL
  Filled 2020-10-11 (×4): qty 2

## 2020-10-11 MED ORDER — INSULIN ASPART 100 UNIT/ML ~~LOC~~ SOLN
0.0000 [IU] | Freq: Every day | SUBCUTANEOUS | Status: DC
  Administered 2020-10-14: 3 [IU] via SUBCUTANEOUS

## 2020-10-11 MED ORDER — METOPROLOL TARTRATE 5 MG/5ML IV SOLN
5.0000 mg | Freq: Once | INTRAVENOUS | Status: AC
  Administered 2020-10-11: 5 mg via INTRAVENOUS
  Filled 2020-10-11: qty 5

## 2020-10-11 MED ORDER — ACETAMINOPHEN 325 MG PO TABS: 650.0000 mg | ORAL_TABLET | ORAL | Status: DC | PRN

## 2020-10-11 MED ORDER — ISOSORBIDE MONONITRATE ER 60 MG PO TB24
60.0000 mg | ORAL_TABLET | Freq: Every day | ORAL | Status: DC
  Administered 2020-10-12 – 2020-10-15 (×3): 60 mg via ORAL
  Filled 2020-10-11 (×4): qty 1

## 2020-10-11 MED ORDER — DILTIAZEM HCL-DEXTROSE 125-5 MG/125ML-% IV SOLN (PREMIX): 5.0000 mg/h | INTRAVENOUS | Status: DC

## 2020-10-11 MED ORDER — ASPIRIN EC 81 MG PO TBEC
81.0000 mg | DELAYED_RELEASE_TABLET | Freq: Every day | ORAL | Status: DC
  Administered 2020-10-12 – 2020-10-15 (×4): 81 mg via ORAL
  Filled 2020-10-11 (×4): qty 1

## 2020-10-11 MED ORDER — ACETAMINOPHEN 325 MG PO TABS: 325.0000 mg | ORAL_TABLET | Freq: Four times a day (QID) | ORAL | Status: DC | PRN

## 2020-10-11 MED ORDER — HEPARIN BOLUS VIA INFUSION
4900.0000 [IU] | Freq: Once | INTRAVENOUS | Status: AC
  Administered 2020-10-11: 4900 [IU] via INTRAVENOUS
  Filled 2020-10-11: qty 4900

## 2020-10-11 MED ORDER — INSULIN ASPART 100 UNIT/ML ~~LOC~~ SOLN
0.0000 [IU] | Freq: Three times a day (TID) | SUBCUTANEOUS | Status: DC
  Administered 2020-10-12 (×2): 3 [IU] via SUBCUTANEOUS
  Administered 2020-10-12: 2 [IU] via SUBCUTANEOUS
  Administered 2020-10-13 (×2): 5 [IU] via SUBCUTANEOUS
  Administered 2020-10-13: 3 [IU] via SUBCUTANEOUS
  Administered 2020-10-14 – 2020-10-15 (×3): 11 [IU] via SUBCUTANEOUS

## 2020-10-11 MED ORDER — HEPARIN (PORCINE) 25000 UT/250ML-% IV SOLN
1500.0000 [IU]/h | INTRAVENOUS | Status: DC
  Administered 2020-10-11: 1400 [IU]/h via INTRAVENOUS
  Administered 2020-10-12: 1500 [IU]/h via INTRAVENOUS
  Filled 2020-10-11 (×2): qty 250

## 2020-10-11 MED ORDER — METOPROLOL TARTRATE 5 MG/5ML IV SOLN
2.5000 mg | Freq: Once | INTRAVENOUS | Status: AC
  Administered 2020-10-11: 2.5 mg via INTRAVENOUS
  Filled 2020-10-11: qty 5

## 2020-10-11 MED ORDER — METOPROLOL TARTRATE 25 MG PO TABS: 12.5000 mg | ORAL_TABLET | Freq: Two times a day (BID) | ORAL | Status: DC

## 2020-10-11 MED ORDER — ONDANSETRON HCL 4 MG/2ML IJ SOLN: 4.0000 mg | Freq: Four times a day (QID) | INTRAMUSCULAR | Status: DC | PRN

## 2020-10-11 MED ORDER — FLUTICASONE PROPIONATE 50 MCG/ACT NA SUSP: 1.0000 | Freq: Every day | NASAL | Status: DC | PRN

## 2020-10-11 MED ORDER — VITAMIN B-12 1000 MCG PO TABS
1000.0000 ug | ORAL_TABLET | Freq: Every day | ORAL | Status: DC
  Administered 2020-10-12 – 2020-10-15 (×4): 1000 ug via ORAL
  Filled 2020-10-11 (×4): qty 1

## 2020-10-11 MED ORDER — NITROGLYCERIN 0.4 MG SL SUBL: 0.4000 mg | SUBLINGUAL_TABLET | SUBLINGUAL | Status: DC | PRN

## 2020-10-11 MED ORDER — LORATADINE 10 MG PO TABS
10.0000 mg | ORAL_TABLET | Freq: Every day | ORAL | Status: DC
  Administered 2020-10-12 – 2020-10-15 (×4): 10 mg via ORAL
  Filled 2020-10-11 (×4): qty 1

## 2020-10-11 MED ORDER — INSULIN GLARGINE 100 UNIT/ML ~~LOC~~ SOLN
44.0000 [IU] | Freq: Every day | SUBCUTANEOUS | Status: DC
  Administered 2020-10-12 (×2): 44 [IU] via SUBCUTANEOUS
  Filled 2020-10-11 (×3): qty 0.44

## 2020-10-11 MED ORDER — METOPROLOL TARTRATE 12.5 MG HALF TABLET
12.5000 mg | ORAL_TABLET | Freq: Four times a day (QID) | ORAL | Status: DC
  Administered 2020-10-12 (×3): 12.5 mg via ORAL
  Filled 2020-10-11 (×3): qty 1

## 2020-10-11 MED ORDER — METOPROLOL TARTRATE 5 MG/5ML IV SOLN
5.0000 mg | Freq: Four times a day (QID) | INTRAVENOUS | Status: DC | PRN
  Administered 2020-10-12 (×2): 5 mg via INTRAVENOUS
  Filled 2020-10-11 (×2): qty 5

## 2020-10-11 MED ORDER — GLIPIZIDE 10 MG PO TABS
10.0000 mg | ORAL_TABLET | Freq: Two times a day (BID) | ORAL | Status: DC
  Administered 2020-10-12 – 2020-10-15 (×7): 10 mg via ORAL
  Filled 2020-10-11 (×8): qty 1

## 2020-10-11 NOTE — ED Triage Notes (Signed)
Pt arrived to ED via EMS from home. Pt states he was cutting up wood and felt fullness in his chest. Pt went in the home and sat down in his chair. Pain continued and pt wife called EMS. Pt denies any chest pain or sob at this time. EMS gave 324 mg of asa. HR 130's

## 2020-10-11 NOTE — Telephone Encounter (Signed)
[name] called in and stated that Ryan Garcia heartrate was at 125 and his BP was at 145/90 and wanted to know if what to do. I am getting them triaged.    Please advise

## 2020-10-11 NOTE — H&P (Signed)
History and Physical   CLEBURN MAIOLO FHL:[id number] DOB: [birth year] DOA: 10/11/2020  Referring MD/NP/PA: Dr. Ralene Bathe  PCP: Abner Greenspan, MD   Outpatient Specialists: Dr. Stanford Breed  Patient coming from: Home  Chief Complaint: Chest pain  HPI: Ryan Garcia is a 87 y.o. male with medical history significant of coronary artery disease status post previous stents, hard of hearing, BPH, diabetes, hypertension, obstructive sleep apnea, morbid obesity, osteoarthritis, who was brought in by wife today due to sudden onset of left-sided chest pain.  Symptoms started this afternoon.  Patient described as sharp.  More pressure-like.  He also felt palpitations.  Denied any nausea or vomiting denied any diaphoresis.  Patient was found to have significant tachycardia here in the ER.  He also has aspirin 325 mg of aspirin prior to coming in.  Patient now feels better.  He has not had any other issues.  He appears to have sinus tachycardia with occasional A. fib rhythm.  He has remote history of atrial fibrillation it appears.  Not on any anticoagulation not actively being treated.  He probably had paroxysmal atrial fibrillation.  He has tried initial metoprolol IV with no obvious improvement.  Patient being admitted for evaluation.  Initial cardiac enzymes within normal..  ED Course: Temperature 99 blood pressure 150/97 on arrival with pulse 128, respirate of 30 oxygen sat 96% on room air.  CBC showed hemoglobin 11.8.  Chemistry showed chloride 115 CO2 21 calcium 7.4.  Initial troponin VIII seconds at 14.  Glucose is 175.  Chest x-ray shows no acute findings.  Patient will be admitted for inpatient treatment.  Cardiology "sided.  Review of Systems: As per HPI otherwise 10 point review of systems negative.    Past Medical History:  Diagnosis Date  . AC (acromioclavicular) joint bone spurs    lt ankle  . Allergy    allergic rhinitis  . Anginal pain ([place])   . Arthritis    OA  . BPH (benign prostatic  hyperplasia)    microwave tx of prostate  . CAD (coronary artery disease)    a. s/p cath in 2012 showing 70-75% LAD stenosis, 75% D1 stenosis, 75% OM1, 60-70% RCA stenosis, and 80% PDA --> medical therapy pursued b. similar findings by cath in 2016; 06/2017 DES to distal RCA  . Chronic upper back pain   . Diabetes mellitus    type II x8 years  . GERD (gastroesophageal reflux disease)   . HLD (hyperlipidemia)    10/12: TC 208, TG 213, HDL 36, LDL 129  . Hx of skin cancer, basal cell    many skin cancers removed/under constant treatment.  . Hypertension   . NSTEMI (non-ST elevated myocardial infarction) ([place]) 06/18/2017   DES to distal RCA  . Obstructive sleep apnea on CPAP   . Scoliosis     Past Surgical History:  Procedure Laterality Date  . BREAST SURGERY  2009   breast lump removed benign  . CARDIAC CATHETERIZATION N/A 03/15/2015   Procedure: Left Heart Cath and Coronary Angiography;  Surgeon: Belva Crome, MD;  Location: [facility];  Service: Cardiovascular;  Laterality: N/A;  . CORONARY STENT INTERVENTION N/A 06/18/2017   Procedure: CORONARY STENT INTERVENTION;  Surgeon: Jettie Booze, MD;  Location: [place] [facility];  Service: Cardiovascular;  Laterality: N/A;  RCA  . LEFT HEART CATH AND CORONARY ANGIOGRAPHY N/A 06/18/2017   Procedure: LEFT HEART CATH AND CORONARY ANGIOGRAPHY;  Surgeon: Jettie Booze, MD;  Location: [facility]  INVASIVE CV LAB;  Service: Cardiovascular;  Laterality: N/A;  . LEFT HEART CATH AND CORONARY ANGIOGRAPHY N/A 04/12/2020   Procedure: LEFT HEART CATH AND CORONARY ANGIOGRAPHY;  Surgeon: Martinique, Peter M, MD;  Location: [place] [facility];  Service: Cardiovascular;  Laterality: N/A;  . ULTRASOUND GUIDANCE FOR VASCULAR ACCESS  06/18/2017   Procedure: Ultrasound Guidance For Vascular Access;  Surgeon: Jettie Booze, MD;  Location: [place];  Service: Cardiovascular;;  right radial, right femoral     reports that he has never  smoked. He has never used smokeless tobacco. He reports that he does not drink alcohol and does not use drugs.  Allergies  Allergen Reactions  . Loratadine Other (See Comments)    Not effective  . Simvastatin Other (See Comments)    Intolerant- caused joint pain    Family History  Problem Relation Age of Onset  . Heart attack Brother   . Colon cancer Neg Hx   . Colon polyps Neg Hx   . Stomach cancer Neg Hx   . Esophageal cancer Neg Hx      Prior to Admission medications   Medication Sig Start Date End Date Taking? Authorizing Provider  acetaminophen (TYLENOL) 325 MG tablet Take 325-650 mg by mouth every 6 (six) hours as needed for mild pain or headache.   Yes [provider]  aspirin 81 MG tablet Take 81 mg by mouth daily.   Yes [provider]  cetirizine (ZYRTEC) 10 MG tablet TAKE 1 TABLET EVERY DAY Patient taking differently: Take 10 mg by mouth daily as needed for allergies. 11/14/16  Yes Tower, Wynelle Fanny, MD  Coenzyme Q10 (CO Q 10 PO) Take 200 mg by mouth daily.   Yes [provider]  Cyanocobalamin (VITAMIN B12) 1000 MCG TBCR Take 1,000 mcg by mouth daily. 04/12/20  Yes Cheryln Manly, NP  fluticasone (FLONASE) 50 MCG/ACT nasal spray Place 1 spray into both nostrils daily as needed for allergies.    Yes [provider]  glipiZIDE (GLUCOTROL) 10 MG tablet TAKE 1 TABLET TWICE DAILY WITH MEALS Patient taking differently: Take 10 mg by mouth 2 (two) times daily before a meal. 08/02/20  Yes Tower, Wynelle Fanny, MD  isosorbide mononitrate (IMDUR) 60 MG 24 hr tablet Take 1 tablet (60 mg total) by mouth daily. 04/18/20  Yes Crenshaw, Denice Bors, MD  LANTUS SOLOSTAR 100 UNIT/ML Solostar Pen INJECT 40 UNITS INTO THE SKIN AT BEDTIME. Patient taking differently: Inject 44 Units into the skin at bedtime. 08/02/20  Yes Tower, Wynelle Fanny, MD  metFORMIN (GLUCOPHAGE) 1000 MG tablet Take 1 tablet (1,000 mg total) by mouth 2 (two) times daily with a meal. 08/16/20  Yes  Tower, Wynelle Fanny, MD  metoprolol tartrate (LOPRESSOR) 25 MG tablet Take 0.5 tablets (12.5 mg total) by mouth 2 (two) times daily. 05/25/20  Yes Lelon Perla, MD  nitroGLYCERIN (NITROSTAT) 0.4 MG SL tablet Place 1 tablet (0.4 mg total) under the tongue every 5 (five) minutes as needed for chest pain. 05/11/19  Yes Duke, Tami Lin, PA  rosuvastatin (CRESTOR) 40 MG tablet Take 1 tablet (40 mg total) by mouth daily. Patient taking differently: Take 40 mg by mouth at bedtime. 08/25/20  Yes Lelon Perla, MD  Blood Glucose Monitoring Suppl (TRUE METRIX AIR GLUCOSE METER) W/DEVICE KIT 1 kit by Other route 2 (two) times daily. Check blood sugar twice daily and as directed. Dx E11.65 03/17/15   Tower, Wynelle Fanny, MD  DROPLET PEN NEEDLES  31G X 6 MM MISC USE ONE TIME DAILY  AT  BEDTIME  WITH  LANTUS 10/07/20   Tower, Wynelle Fanny, MD  TRUE METRIX BLOOD GLUCOSE TEST test strip CHECK BLOOD SUGAR TWICE DAILY 12/15/19   Tower, Wynelle Fanny, MD  TRUEPLUS LANCETS 30G MISC Check blood sugar twice daily and as directed. Dx E11.65 03/17/15   Abner Greenspan, MD    Physical Exam: Vitals:   10/11/20 2000 10/11/20 2015 10/11/20 2030 10/11/20 2130  BP: (!) 125/97 131/85 138/78 118/89  Pulse: (!) 128 (!) 128 (!) 126 (!) 125  Resp: (!) 26 (!) 24 (!) 21 (!) 22  Temp:      TempSrc:      SpO2: 96% 97% 97% 96%  Weight:      Height:          Constitutional: NAD, calm, comfortable Vitals:   10/11/20 2000 10/11/20 2015 10/11/20 2030 10/11/20 2130  BP: (!) 125/97 131/85 138/78 118/89  Pulse: (!) 128 (!) 128 (!) 126 (!) 125  Resp: (!) 26 (!) 24 (!) 21 (!) 22  Temp:      TempSrc:      SpO2: 96% 97% 97% 96%  Weight:      Height:       Eyes: PERRL, lids and conjunctivae normal ENMT: Mucous membranes are moist. Posterior pharynx clear of any exudate or lesions.Normal dentition.  Neck: normal, supple, no masses, no thyromegaly Respiratory: clear to auscultation bilaterally, no wheezing, no crackles. Normal respiratory  effort. No accessory muscle use.  Cardiovascular: Irregularly irregular with tachycardia, no murmurs / rubs / gallops. No extremity edema. 2+ pedal pulses. No carotid bruits.  Abdomen: no tenderness, no masses palpated. No hepatosplenomegaly. Bowel sounds positive.  Musculoskeletal: no clubbing / cyanosis. No joint deformity upper and lower extremities. Good ROM, no contractures. Normal muscle tone.  Skin: no rashes, lesions, ulcers. No induration Neurologic: CN 2-12 grossly intact. Sensation intact, DTR normal. Strength 5/5 in all 4.  Psychiatric: Normal judgment and insight. Alert and oriented x 3. Normal mood.     Labs on Admission: I have personally reviewed following labs and imaging studies  CBC: Recent Labs  Lab 10/11/20 1737  WBC 6.5  NEUTROABS 4.2  HGB 11.8*  HCT 35.9*  MCV 94.2  PLT 480   Basic Metabolic Panel: Recent Labs  Lab 10/11/20 1737  NA 140  K 4.5  CL 115*  CO2 21*  GLUCOSE 175*  BUN 20  CREATININE 1.01  CALCIUM 7.4*   GFR: Estimated Creatinine Clearance: 68.3 mL/min (by C-G formula based on SCr of 1.01 mg/dL). Liver Function Tests: Recent Labs  Lab 10/11/20 1737  AST 30  ALT 14  ALKPHOS 54  BILITOT 0.7  PROT 4.9*  ALBUMIN 2.8*   No results for input(s): LIPASE, AMYLASE in the last 168 hours. No results for input(s): AMMONIA in the last 168 hours. Coagulation Profile: No results for input(s): INR, PROTIME in the last 168 hours. Cardiac Enzymes: No results for input(s): CKTOTAL, CKMB, CKMBINDEX, TROPONINI in the last 168 hours. BNP (last 3 results) No results for input(s): PROBNP in the last 8760 hours. HbA1C: No results for input(s): HGBA1C in the last 72 hours. CBG: Recent Labs  Lab 10/11/20 1814  GLUCAP 178*   Lipid Profile: No results for input(s): CHOL, HDL, LDLCALC, TRIG, CHOLHDL, LDLDIRECT in the last 72 hours. Thyroid Function Tests: No results for input(s): TSH, T4TOTAL, FREET4, T3FREE, THYROIDAB in the last 72  hours. Anemia Panel: No results for  input(s): VITAMINB12, FOLATE, FERRITIN, TIBC, IRON, RETICCTPCT in the last 72 hours. Urine analysis:    Component Value Date/Time   COLORURINE YELLOW 04/14/2020 1950   APPEARANCEUR HAZY (A) 04/14/2020 1950   LABSPEC 1.021 04/14/2020 1950   PHURINE 5.0 04/14/2020 1950   GLUCOSEU 150 (A) 04/14/2020 1950   HGBUR SMALL (A) 04/14/2020 1950   BILIRUBINUR NEGATIVE 04/14/2020 1950   BILIRUBINUR Negative 09/23/2017 [place] 04/14/2020 1950   PROTEINUR NEGATIVE 04/14/2020 1950   UROBILINOGEN 0.2 09/23/2017 1018   NITRITE NEGATIVE 04/14/2020 1950   LEUKOCYTESUR NEGATIVE 04/14/2020 1950   Sepsis Labs: @LABRCNTIP (procalcitonin:4,lacticidven:4) )No results found for this or any previous visit (from the past 240 hour(s)).   Radiological Exams on Admission: DG Chest Port 1 View  Result Date: 10/11/2020 CLINICAL DATA:  Chest pain EXAM: PORTABLE CHEST 1 VIEW COMPARISON:  04/14/2020 FINDINGS: Borderline cardiomegaly. No focal airspace disease or effusion. Aortic atherosclerosis. No pneumothorax. IMPRESSION: No active disease. Borderline cardiomegaly. Electronically Signed   By: Donavan Foil M.D.   On: 10/11/2020 18:21    EKG: Independently reviewed.  Shows sinus tachycardia with a rate of 130s with some abnormal areas of irregularity possibly consistent with A. fib  Assessment/Plan Principal Problem:   Rapid atrial fibrillation (HCC) Active Problems:   CAD S/P percutaneous coronary angioplasty   Hyperlipidemia associated with type 2 diabetes mellitus (HCC)   GERD (gastroesophageal reflux disease)   Uncontrolled type 2 diabetes mellitus with hyperglycemia (HCC)   Coronary artery disease involving native coronary artery of native heart with angina pectoris ([place])     #1 rapid atrial fibrillation: Patient will be admitted.  Initial plan is to start him on Cardizem drip.  Discussed with cardiology who recommend escalating beta-blockers.  Full  anticoagulation with heparin.  Transition to oral anticoagulants possibly prior to discharge.  Echocardiogram in the morning.  Follow on telemetry M[place].  Will need to rule out PE as a possible cause of his tachycardia especially with chest pain.  #2 coronary artery disease: Continue telemetry and per above.  Continue to cycle enzymes.  #3 diabetes: Continue home regimen and add sliding scale insulin.  No Metformin.  #4 hyperlipidemia: Continue statin  #5 essential hypertension: Continue home regimen.  Blood pressure already controlled.  Will be escalating beta-blockers.  #6 morbid obesity: Dietary counseling.  #7 obstructive sleep apnea: May use CPAP at night if tolerated   DVT prophylaxis: Heparin drip Code Status: Full code Family Communication: Wife at bedside Disposition Plan: Home Consults called: Dr. Vickki Muff of cardiology cough sided Admission status: Inpatient  Severity of Illness: The appropriate patient status for this patient is INPATIENT. Inpatient status is judged to be reasonable and necessary in order to provide the required intensity of service to ensure the patient's safety. The patient's presenting symptoms, physical exam findings, and initial radiographic and laboratory data in the context of their chronic comorbidities is felt to place them at high risk for further clinical deterioration. Furthermore, it is not anticipated that the patient will be medically stable for discharge from the hospital within 2 midnights of admission. The following factors support the patient status of inpatient.   " The patient's presenting symptoms include chest pain. " The worrisome physical exam findings include tachycardia. " The initial radiographic and laboratory data are worrisome because of negative enzymes but EKG abnormal. " The chronic co-morbidities include diabetes with hypertension.   * I certify that at the point of admission it is my clinical judgment that the patient will  require inpatient hospital care spanning beyond 2 midnights from the point of admission due to high intensity of service, high risk for further deterioration and high frequency of surveillance required.Barbette Merino MD [facility] Pager [phone]  If 7PM-7AM, please contact night-coverage [email or web address] Password [email or web address]  10/11/2020, 10:10 PM

## 2020-10-11 NOTE — ED Provider Notes (Addendum)
[facility] EMERGENCY DEPARTMENT Provider Note   CSN: [id number] Arrival date & time: 10/11/20  1718     History Chief Complaint  Patient presents with  . Chest Pain    Ryan Garcia is a 87 y.o. male.  The history is provided by the patient, the EMS personnel and medical records.  Chest Pain  Ryan Garcia is a 87 y.o. male who presents to the Emergency Department complaining of chest discomfort. He presents the emergency department by EMS complaining of chest discomfort that started earlier today. He does have a history of coronary artery disease. He was in his routine state of health when he woke up this morning. He worked in his [employer] for several hours and then went inside to rest. After eating lunch and resting he noticed some central chest discomfort. He states that he may not have noticed this discomfort earlier if he was in the [place]. He denies any associated fevers, cough, shortness of breath, nausea, vomiting, diaphoresis. He has chronic lower extremity edema and this is unchanged from his baseline. No recent medication changes. He is compliant with his medications. He states that at the time of ED arrival he has no chest discomfort. He did receive 324 mg of aspirin prior to ED arrival.    Past Medical History:  Diagnosis Date  . AC (acromioclavicular) joint bone spurs    lt ankle  . Allergy    allergic rhinitis  . Anginal pain ([place])   . Arthritis    OA  . BPH (benign prostatic hyperplasia)    microwave tx of prostate  . CAD (coronary artery disease)    a. s/p cath in 2012 showing 70-75% LAD stenosis, 75% D1 stenosis, 75% OM1, 60-70% RCA stenosis, and 80% PDA --> medical therapy pursued b. similar findings by cath in 2016; 06/2017 DES to distal RCA  . Chronic upper back pain   . Diabetes mellitus    type II x8 years  . GERD (gastroesophageal reflux disease)   . HLD (hyperlipidemia)    10/12: TC 208, TG 213, HDL 36, LDL 129  . Hx of skin cancer,  basal cell    many skin cancers removed/under constant treatment.  . Hypertension   . NSTEMI (non-ST elevated myocardial infarction) ([place]) 06/18/2017   DES to distal RCA  . Obstructive sleep apnea on CPAP   . Scoliosis     Patient Active Problem List   Diagnosis Date Noted  . Rapid atrial fibrillation ([place]) 10/11/2020  . Laceration of skin of scalp 08/16/2020  . Head injury 08/16/2020  . Poor balance 07/26/2020  . Anemia 04/20/2020  . Renal mass, right 04/18/2020  . Fever 04/14/2020  . AKI (acute kidney injury) ([place]) 04/14/2020  . Cholangitis 04/14/2020  . Unstable angina ([place])   . Chest pain 04/11/2020  . Left elbow pain 11/18/2019  . Pain and swelling of right lower leg 06/23/2019  . Coronary artery disease involving native coronary artery of native heart with angina pectoris ([place]) 01/18/2019  . Medicare annual wellness visit, subsequent 01/16/2019  . Ecchymosis 10/22/2018  . Obesity (BMI 30-39.9) 07/09/2018  . HOH (hard of hearing) 03/31/2018  . Uncontrolled type 2 diabetes mellitus with hyperglycemia ([place]) 10/28/2017  . Subclinical hypothyroidism 10/28/2017  . Scoliosis   . Obstructive sleep apnea on CPAP   . Hx of skin cancer, basal cell   . Hyperlipidemia associated with type 2 diabetes mellitus ([place])   . GERD (gastroesophageal reflux disease)   .  Chronic upper back pain   . Arthritis   . Allergy   . NSTEMI (non-ST elevated myocardial infarction) ([place]) 06/18/2017  . Actinic keratoses 04/29/2017  . Positive colorectal cancer screening using Cologuard test 10/31/2016  . BPH (benign prostatic hyperplasia) 09/04/2016  . B12 deficiency 11/22/2015  . Routine general medical examination at a health care facility 08/26/2015  . Abnormal nuclear cardiac imaging test   . Dyspnea on exertion 11/27/2013  . Encounter for examination of normal volunteer in research study 05/29/2013  . Prostate cancer screening 07/15/2012  . CAD S/P percutaneous coronary angioplasty 05/17/2011   . Nonspecific abnormal results of cardiovascular function study 04/30/2011  . Abnormal EKG 04/06/2011  . Right low back pain 01/13/2010  . Insulin dependent diabetes mellitus 10/04/2006  . ERECTILE DYSFUNCTION 10/04/2006  . Essential hypertension 10/04/2006  . Allergic rhinitis 10/04/2006  . OSTEOARTHRITIS 10/04/2006  . Sleep apnea 10/04/2006  . EDEMA 10/04/2006  . SKIN CANCER, HX OF 10/04/2006    Past Surgical History:  Procedure Laterality Date  . BREAST SURGERY  2009   breast lump removed benign  . CARDIAC CATHETERIZATION N/A 03/15/2015   Procedure: Left Heart Cath and Coronary Angiography;  Surgeon: Belva Crome, MD;  Location: [facility];  Service: Cardiovascular;  Laterality: N/A;  . CORONARY STENT INTERVENTION N/A 06/18/2017   Procedure: CORONARY STENT INTERVENTION;  Surgeon: Jettie Booze, MD;  Location: [facility];  Service: Cardiovascular;  Laterality: N/A;  RCA  . LEFT HEART CATH AND CORONARY ANGIOGRAPHY N/A 06/18/2017   Procedure: LEFT HEART CATH AND CORONARY ANGIOGRAPHY;  Surgeon: Jettie Booze, MD;  Location: [facility];  Service: Cardiovascular;  Laterality: N/A;  . LEFT HEART CATH AND CORONARY ANGIOGRAPHY N/A 04/12/2020   Procedure: LEFT HEART CATH AND CORONARY ANGIOGRAPHY;  Surgeon: Martinique, Peter M, MD;  Location: [facility];  Service: Cardiovascular;  Laterality: N/A;  . ULTRASOUND GUIDANCE FOR VASCULAR ACCESS  06/18/2017   Procedure: Ultrasound Guidance For Vascular Access;  Surgeon: Jettie Booze, MD;  Location: [facility];  Service: Cardiovascular;;  right radial, right femoral       Family History  Problem Relation Age of Onset  . Heart attack Brother   . Colon cancer Neg Hx   . Colon polyps Neg Hx   . Stomach cancer Neg Hx   . Esophageal cancer Neg Hx     Social History   Tobacco Use  . Smoking status: Never Smoker  . Smokeless tobacco: Never Used  Vaping Use  . Vaping Use: Never used   Substance Use Topics  . Alcohol use: No    Alcohol/week: 0.0 standard drinks  . Drug use: No    Home Medications Prior to Admission medications   Medication Sig Start Date End Date Taking? Authorizing Provider  acetaminophen (TYLENOL) 325 MG tablet Take 325-650 mg by mouth every 6 (six) hours as needed for mild pain or headache.   Yes [provider]  aspirin 81 MG tablet Take 81 mg by mouth daily.   Yes [provider]  cetirizine (ZYRTEC) 10 MG tablet TAKE 1 TABLET EVERY DAY Patient taking differently: Take 10 mg by mouth daily as needed for allergies. 11/14/16  Yes Tower, Wynelle Fanny, MD  Coenzyme Q10 (CO Q 10 PO) Take 200 mg by mouth daily.   Yes [provider]  Cyanocobalamin (VITAMIN B12) 1000 MCG TBCR Take 1,000 mcg by mouth daily. 04/12/20  Yes Cheryln Manly, NP  fluticasone (FLONASE) 50 MCG/ACT nasal spray Place 1 spray into both nostrils daily as needed for allergies.    Yes [provider]  glipiZIDE (GLUCOTROL) 10 MG tablet TAKE 1 TABLET TWICE DAILY WITH MEALS Patient taking differently: Take 10 mg by mouth 2 (two) times daily before a meal. 08/02/20  Yes Tower, Wynelle Fanny, MD  isosorbide mononitrate (IMDUR) 60 MG 24 hr tablet Take 1 tablet (60 mg total) by mouth daily. 04/18/20  Yes Crenshaw, Denice Bors, MD  LANTUS SOLOSTAR 100 UNIT/ML Solostar Pen INJECT 40 UNITS INTO THE SKIN AT BEDTIME. Patient taking differently: Inject 44 Units into the skin at bedtime. 08/02/20  Yes Tower, Wynelle Fanny, MD  metFORMIN (GLUCOPHAGE) 1000 MG tablet Take 1 tablet (1,000 mg total) by mouth 2 (two) times daily with a meal. 08/16/20  Yes Tower, Wynelle Fanny, MD  metoprolol tartrate (LOPRESSOR) 25 MG tablet Take 0.5 tablets (12.5 mg total) by mouth 2 (two) times daily. 05/25/20  Yes Lelon Perla, MD  nitroGLYCERIN (NITROSTAT) 0.4 MG SL tablet Place 1 tablet (0.4 mg total) under the tongue every 5 (five) minutes as needed for chest pain. 05/11/19  Yes Duke, Tami Lin, PA   rosuvastatin (CRESTOR) 40 MG tablet Take 1 tablet (40 mg total) by mouth daily. Patient taking differently: Take 40 mg by mouth at bedtime. 08/25/20  Yes Lelon Perla, MD  Blood Glucose Monitoring Suppl (TRUE METRIX AIR GLUCOSE METER) W/DEVICE KIT 1 kit by Other route 2 (two) times daily. Check blood sugar twice daily and as directed. Dx E11.65 03/17/15   Tower, Wynelle Fanny, MD  DROPLET PEN NEEDLES 31G X 6 MM MISC USE ONE TIME DAILY  AT  BEDTIME  WITH  LANTUS 10/07/20   Tower, Wynelle Fanny, MD  TRUE METRIX BLOOD GLUCOSE TEST test strip CHECK BLOOD SUGAR TWICE DAILY 12/15/19   Tower, Wynelle Fanny, MD  TRUEPLUS LANCETS 30G MISC Check blood sugar twice daily and as directed. Dx E11.65 03/17/15   Abner Greenspan, MD    Allergies    Loratadine and Simvastatin  Review of Systems   Review of Systems  Cardiovascular: Positive for chest pain.  All other systems reviewed and are negative.   Physical Exam Updated Vital Signs BP 118/89   Pulse (!) 125   Temp 99 F (37.2 C) (Oral)   Resp (!) 22   Ht 5' 11"  (1.803 m)   Wt 108.9 kg   SpO2 96%   BMI 33.47 kg/m   Physical Exam Vitals and nursing note reviewed.  Constitutional:      Appearance: He is well-developed.  HENT:     Head: Normocephalic and atraumatic.  Cardiovascular:     Rate and Rhythm: Regular rhythm. Tachycardia present.     Heart sounds: No murmur heard.   Pulmonary:     Effort: Pulmonary effort is normal. No respiratory distress.     Breath sounds: Normal breath sounds.  Abdominal:     Palpations: Abdomen is soft.     Tenderness: There is no abdominal tenderness. There is no guarding or rebound.  Musculoskeletal:        General: No tenderness.     Comments: 2+ pitting edema to bilateral lower extremities  Skin:    General: Skin is warm and dry.  Neurological:     Mental Status: He is alert and oriented to person, place, and time.  Psychiatric:        Behavior: Behavior normal.     ED Results / Procedures /  Treatments    Labs (all labs ordered are listed, but only abnormal results are displayed) Labs Reviewed  COMPREHENSIVE METABOLIC PANEL - Abnormal; Notable for the following components:      Result Value   Chloride 115 (*)    CO2 21 (*)    Glucose, Bld 175 (*)    Calcium 7.4 (*)    Total Protein 4.9 (*)    Albumin 2.8 (*)    Anion gap 4 (*)    All other components within normal limits  CBC WITH DIFFERENTIAL/PLATELET - Abnormal; Notable for the following components:   RBC 3.81 (*)    Hemoglobin 11.8 (*)    HCT 35.9 (*)    RDW 15.8 (*)    All other components within normal limits  CBG MONITORING, ED - Abnormal; Notable for the following components:   Glucose-Capillary 178 (*)    All other components within normal limits  CBG MONITORING, ED - Abnormal; Notable for the following components:   Glucose-Capillary 170 (*)    All other components within normal limits  SARS CORONAVIRUS 2 (TAT 6-24 HRS)  HEMOGLOBIN A1C  CBC  LIPID PANEL  BASIC METABOLIC PANEL  HEPARIN LEVEL (UNFRACTIONATED)  TROPONIN I (HIGH SENSITIVITY)  TROPONIN I (HIGH SENSITIVITY)    EKG EKG Interpretation  Date/Time:  Tuesday October 11 2020 17:26:37 EDT Ventricular Rate:  126 PR Interval:  133 QRS Duration: 88 QT Interval:  313 QTC Calculation: 454 R Axis:   73 Text Interpretation: Sinus tachycardia Repolarization abnormality, prob rate related Confirmed by Quintella Reichert [phone]) on 10/11/2020 8:04:28 PM   Radiology DG Chest Port 1 View  Result Date: 10/11/2020 CLINICAL DATA:  Chest pain EXAM: PORTABLE CHEST 1 VIEW COMPARISON:  04/14/2020 FINDINGS: Borderline cardiomegaly. No focal airspace disease or effusion. Aortic atherosclerosis. No pneumothorax. IMPRESSION: No active disease. Borderline cardiomegaly. Electronically Signed   By: Donavan Foil M.D.   On: 10/11/2020 18:21    Procedures Procedures  CRITICAL CARE Performed by: Quintella Reichert   Total critical care time: 35 minutes  Critical care time was  exclusive of separately billable procedures and treating other patients.  Critical care was necessary to treat or prevent imminent or life-threatening deterioration.  Critical care was time spent personally by me on the following activities: development of treatment plan with patient and/or surrogate as well as nursing, discussions with consultants, evaluation of patient's response to treatment, examination of patient, obtaining history from patient or surrogate, ordering and performing treatments and interventions, ordering and review of laboratory studies, ordering and review of radiographic studies, pulse oximetry and re-evaluation of patient's condition.  Medications Ordered in ED Medications  aspirin EC tablet 81 mg (has no administration in time range)  loratadine (CLARITIN) tablet 10 mg (has no administration in time range)  nitroGLYCERIN (NITROSTAT) SL tablet 0.4 mg (has no administration in time range)  fluticasone (FLONASE) 50 MCG/ACT nasal spray 1 spray (has no administration in time range)  vitamin B-12 (CYANOCOBALAMIN) tablet 1,000 mcg (has no administration in time range)  acetaminophen (TYLENOL) tablet 325-650 mg (has no administration in time range)  isosorbide mononitrate (IMDUR) 24 hr tablet 60 mg (has no administration in time range)  insulin glargine (LANTUS) injection 44 Units (has no administration in time range)  glipiZIDE (GLUCOTROL) tablet 10 mg (has no administration in time range)  rosuvastatin (CRESTOR) tablet 40 mg (40 mg Oral Given 10/11/20 2315)  acetaminophen (TYLENOL) tablet 650 mg (has no administration in time range)  ondansetron (ZOFRAN) injection 4 mg (has no  administration in time range)  insulin aspart (novoLOG) injection 0-15 Units (has no administration in time range)  insulin aspart (novoLOG) injection 0-5 Units (0 Units Subcutaneous Not Given 10/11/20 2251)  heparin ADULT infusion 100 units/mL (25000 units/232m) (1,400 Units/hr Intravenous New Bag/Given  10/11/20 2321)  metoprolol tartrate (LOPRESSOR) tablet 12.5 mg (has no administration in time range)  metoprolol tartrate (LOPRESSOR) injection 5 mg (has no administration in time range)  metoprolol tartrate (LOPRESSOR) injection 2.5 mg (2.5 mg Intravenous Given 10/11/20 1747)  metoprolol tartrate (LOPRESSOR) injection 5 mg (5 mg Intravenous Given 10/11/20 2101)  heparin bolus via infusion 4,900 Units (4,900 Units Intravenous Bolus from Bag 10/11/20 2321)    ED Course  I have reviewed the triage vital signs and the nursing notes.  Pertinent labs & imaging results that were available during my care of the patient were reviewed by me and considered in my medical decision making (see chart for details).    MDM Rules/Calculators/A&P                         patient here for evaluation of chest discomfort that started earlier today. Patient tachycardic on ED arrival but asymptomatic in the department. EKG with sinus tach versus flutter. He was treated with metoprolol with no change in rhythm. Will start on Cardizem for possible a flutter. Given his extensive cardiac history plan to admit to the medicine service for ongoing workup and treatment.  Final Clinical Impression(s) / ED Diagnoses Final diagnoses:  Precordial pain    Rx / DC Orders ED Discharge Orders    None       RQuintella Reichert MD 10/11/20 24179   RQuintella Reichert MD 10/31/20 [phone]

## 2020-10-11 NOTE — Progress Notes (Addendum)
ANTICOAGULATION CONSULT NOTE - Initial Consult  Pharmacy Consult for heparin Indication: atrial fibrillation  Allergies  Allergen Reactions  . Loratadine Other (See Comments)    Not effective  . Simvastatin Other (See Comments)    Intolerant- caused joint pain    Patient Measurements: Height: 5\' 11"  (180.3 cm) Weight: 108.9 kg (240 lb) IBW/kg (Calculated) : 75.3 Heparin Dosing Weight: 98.5 kg  Vital Signs: Temp: 99 F (37.2 C) (04/12 1729) Temp Source: Oral (04/12 1729) BP: 118/89 (04/12 2130) Pulse Rate: 125 (04/12 2130)  Labs: Recent Labs    10/11/20 1737 10/11/20 2042  HGB 11.8*  --   HCT 35.9*  --   PLT 156  --   CREATININE 1.01  --   TROPONINIHS 8 14    Estimated Creatinine Clearance: 68.3 mL/min (by C-G formula based on SCr of 1.01 mg/dL).   Medical History: Past Medical History:  Diagnosis Date  . AC (acromioclavicular) joint bone spurs    lt ankle  . Allergy    allergic rhinitis  . Anginal pain ([place])   . Arthritis    OA  . BPH (benign prostatic hyperplasia)    microwave tx of prostate  . CAD (coronary artery disease)    a. s/p cath in 2012 showing 70-75% LAD stenosis, 75% D1 stenosis, 75% OM1, 60-70% RCA stenosis, and 80% PDA --> medical therapy pursued b. similar findings by cath in 2016; 06/2017 DES to distal RCA  . Chronic upper back pain   . Diabetes mellitus    type II x8 years  . GERD (gastroesophageal reflux disease)   . HLD (hyperlipidemia)    10/12: TC 208, TG 213, HDL 36, LDL 129  . Hx of skin cancer, basal cell    many skin cancers removed/under constant treatment.  . Hypertension   . NSTEMI (non-ST elevated myocardial infarction) ([place]) 06/18/2017   DES to distal RCA  . Obstructive sleep apnea on CPAP   . Scoliosis     Medications:  Scheduled:  . [START ON 10/12/2020] aspirin  81 mg Oral Daily  . [START ON 10/12/2020] glipiZIDE  10 mg Oral BID AC  . [START ON 10/12/2020] insulin aspart  0-15 Units Subcutaneous TID WC  .  insulin aspart  0-5 Units Subcutaneous QHS  . insulin glargine  44 Units Subcutaneous QHS  . [START ON 10/12/2020] isosorbide mononitrate  60 mg Oral Daily  . [START ON 10/12/2020] loratadine  10 mg Oral Daily  . metoprolol tartrate  12.5 mg Oral BID  . rosuvastatin  40 mg Oral QHS  . [START ON 10/12/2020] Vitamin B12  1,000 mcg Oral Daily    Assessment: 14 yom was having chest fullness, found to have elevated HR in Afib - no AC PTA.  Hgb 11.8, plt 156, trop 14. No s/sx of bleeding.   Goal of Therapy:  Heparin level 0.3-0.7 units/ml Monitor platelets by anticoagulation protocol: Yes   Plan:  Give 4900 units bolus x 1 Start heparin infusion at 1400 units/hr Check anti-Xa level in 8 hours and daily while on heparin Continue to monitor H&H and platelets  Antonietta Jewel, PharmD, [facility] Pharmacist  Phone: [phone] 10/11/2020 10:33 PM  Please check AMION for all [facility] phone numbers After 10:00 PM, call [place] [phone]

## 2020-10-11 NOTE — Telephone Encounter (Signed)
[facility] - Client TELEPHONE ADVICE RECORD [facility] Patient Name: Ryan Garcia AS Gender: Male DOB: 01/31/1934 Age: 87 Y 25 M 22 D Return Phone Number: [phone] (Primary), [phone] (Secondary) Address[place] [address] Client [facility] - Client Client Site [facility] Physician Tower, Roque Lias - MD Contact Type Call Who Is Calling Patient / Member / Family / Caregiver Call Type Triage / Clinical Caller Name Lelan Pons Relationship To Patient Spouse Return Phone Number [phone] (Primary) Chief Complaint CHEST PAIN - pain, pressure, heaviness or tightness Reason for Call Symptomatic / Request for [place] states her husbands blood pressure is 140/90. Wanting to know if that is normal. He is also feeling chest heaviness. Translation No Nurse Assessment Nurse: Hassell Done, RN, Melanie Date/Time (Eastern Time): 10/11/2020 3:58:48 PM Confirm and document reason for call. If symptomatic, describe symptoms. ---Caller states her H has 140/90 and his chest feels tight. He feels fullness in his esophagus. His heart rate was 129 and is 125 with last blood pressure. Does the patient have any new or worsening symptoms? ---Yes Will a triage be completed? ---Yes Related visit to physician within the last 2 weeks? ---No Does the PT have any chronic conditions? (i.e. diabetes, asthma, this includes High risk factors for pregnancy, etc.) ---Yes List chronic conditions. ---diabetes heart disease Is this a behavioral health or substance abuse call? ---No Guidelines Guideline Title Affirmed Question Affirmed Notes Nurse Date/Time Eilene Ghazi Time) Chest Pain [1] Chest pain lasts > 5 minutes AND [2] age > 57 Hassell Done, RN, Threasa Beards 10/11/2020 4:00:48 PM Disp. Time Eilene Ghazi Time) Disposition Final User 10/11/2020 3:55:57 PM Send to Urgent Queue  Harrington, Lauren 10/11/2020 4:11:54 PM 911 Outcome Documentation Hassell Done, RN, Melanie PLEASE NOTE: All timestamps contained within this report are represented as [place] Standard Time. CONFIDENTIALTY NOTICE: This fax transmission is intended only for the addressee. It contains information that is legally privileged, confidential or otherwise protected from use or disclosure. If you are not the intended recipient, you are strictly prohibited from reviewing, disclosing, copying using or disseminating any of this information or taking any action in reliance on or regarding this information. If you have received this fax in error, please notify us immediately by telephone so that we can arrange for its return to [place]. Phone: [phone], Toll-Free: [phone], Fax: [phone] Page: 2 of 2 Call Id: [id number] [place]. Time Eilene Ghazi Time) Disposition Final User Reason: Caller states she has 911 on the line now 10/11/2020 4:04:13 PM Call EMS 911 Now Yes Hassell Done, RN, Threasa Beards Caller Disagree/Comply Comply Caller Understands Yes PreDisposition Call Doctor Care Advice Given Per Guideline CALL EMS 911 NOW: * Immediate medical attention is needed. You need to hang up and call 911 (or an ambulance). NOTE TO TRIAGER - IF CALLER ASKS ABOUT ASPIRIN: * Call EMS 911 first. Referrals GO TO FACILITY UNDECIDED

## 2020-10-11 NOTE — Telephone Encounter (Signed)
I spoke with pts wife (DPR signed) pt is on his way by ambulance to [facility] ED; Lelan Pons said that EMS saw something on EKG and BP and P high. Sending note to DR [employer] now.

## 2020-10-12 ENCOUNTER — Other Ambulatory Visit: Payer: Self-pay

## 2020-10-12 ENCOUNTER — Inpatient Hospital Stay (HOSPITAL_COMMUNITY): Payer: Medicare HMO

## 2020-10-12 ENCOUNTER — Encounter (HOSPITAL_COMMUNITY): Payer: Self-pay | Admitting: Internal Medicine

## 2020-10-12 ENCOUNTER — Other Ambulatory Visit (HOSPITAL_COMMUNITY): Payer: Self-pay

## 2020-10-12 DIAGNOSIS — I4891 Unspecified atrial fibrillation: Secondary | ICD-10-CM | POA: Diagnosis not present

## 2020-10-12 DIAGNOSIS — Z9861 Coronary angioplasty status: Secondary | ICD-10-CM | POA: Diagnosis not present

## 2020-10-12 DIAGNOSIS — I4892 Unspecified atrial flutter: Secondary | ICD-10-CM | POA: Diagnosis not present

## 2020-10-12 DIAGNOSIS — I2510 Atherosclerotic heart disease of native coronary artery without angina pectoris: Secondary | ICD-10-CM | POA: Diagnosis not present

## 2020-10-12 LAB — HEPARIN LEVEL (UNFRACTIONATED): Heparin Unfractionated: 0.29 IU/mL — ABNORMAL LOW (ref 0.30–0.70)

## 2020-10-12 LAB — BASIC METABOLIC PANEL
Anion gap: 8 (ref 5–15)
BUN: 16 mg/dL (ref 8–23)
CO2: 23 mmol/L (ref 22–32)
Calcium: 9 mg/dL (ref 8.9–10.3)
Chloride: 108 mmol/L (ref 98–111)
Creatinine, Ser: 1.02 mg/dL (ref 0.61–1.24)
GFR, Estimated: >60 mL/min (ref >60)
Glucose, Bld: 154 mg/dL — ABNORMAL HIGH (ref 70–99)
Potassium: 4.1 mmol/L (ref 3.5–5.1)
Sodium: 139 mmol/L (ref 135–145)

## 2020-10-12 LAB — TSH: TSH: 8.47 u[IU]/mL — ABNORMAL HIGH (ref 0.350–4.500)

## 2020-10-12 LAB — CBC
HCT: 44.8 % (ref 39.0–52.0)
Hemoglobin: 14.5 g/dL (ref 13.0–17.0)
MCH: 30.4 pg (ref 26.0–34.0)
MCHC: 32.4 g/dL (ref 30.0–36.0)
MCV: 93.9 fL (ref 80.0–100.0)
Platelets: 144 10*3/uL — ABNORMAL LOW (ref 150–400)
RBC: 4.77 MIL/uL (ref 4.22–5.81)
RDW: 15.7 % — ABNORMAL HIGH (ref 11.5–15.5)
WBC: 6.9 10*3/uL (ref 4.0–10.5)
nRBC: 0 % (ref 0.0–0.2)

## 2020-10-12 LAB — GLUCOSE, CAPILLARY
Glucose-Capillary: 174 mg/dL — ABNORMAL HIGH (ref 70–99)
Glucose-Capillary: 146 mg/dL — ABNORMAL HIGH (ref 70–99)
Glucose-Capillary: 168 mg/dL — ABNORMAL HIGH (ref 70–99)
Glucose-Capillary: 175 mg/dL — ABNORMAL HIGH (ref 70–99)

## 2020-10-12 LAB — LIPID PANEL
Cholesterol: 113 mg/dL (ref 0–200)
HDL: 41 mg/dL (ref >40)
LDL Cholesterol: 57 mg/dL (ref 0–99)
Total CHOL/HDL Ratio: 2.8 RATIO
Triglycerides: 73 mg/dL (ref <150)
VLDL: 15 mg/dL (ref 0–40)

## 2020-10-12 LAB — MAGNESIUM: Magnesium: 1.8 mg/dL (ref 1.7–2.4)

## 2020-10-12 LAB — HEMOGLOBIN A1C
Hgb A1c MFr Bld: 8 % — ABNORMAL HIGH (ref 4.8–5.6)
Mean Plasma Glucose: 182.9 mg/dL

## 2020-10-12 LAB — ECHOCARDIOGRAM COMPLETE
Area-P 1/2: 7 cm2
Height: 71.5 in
S' Lateral: 3.2 cm
Weight: 3939.2 oz

## 2020-10-12 LAB — SARS CORONAVIRUS 2 (TAT 6-24 HRS): SARS Coronavirus 2: NEGATIVE

## 2020-10-12 LAB — T4, FREE: Free T4: 0.74 ng/dL (ref 0.61–1.12)

## 2020-10-12 IMAGING — CT CT ANGIO CHEST
2 of 6 series · 18 of 36 positions shown · IV contrast (omnipaque)
Comparison: [DATE]

CLINICAL DATA: Suspected pulmonary embolism.

EXAM:
CT ANGIOGRAPHY CHEST WITH CONTRAST
TECHNIQUE: Multidetector CT imaging of the chest was performed using the
standard protocol during bolus administration of intravenous
contrast. Multiplanar CT image reconstructions and MIPs were
obtained to evaluate the vascular anatomy.
CONTRAST:  80mL OMNIPAQUE IOHEXOL 350 MG/ML SOLN

[Series 9: pe thins · axial · 0.76mm/px · z∈[+1252,+1504]mm · 17 of 402 slices shown]
[im 21/402  lung]
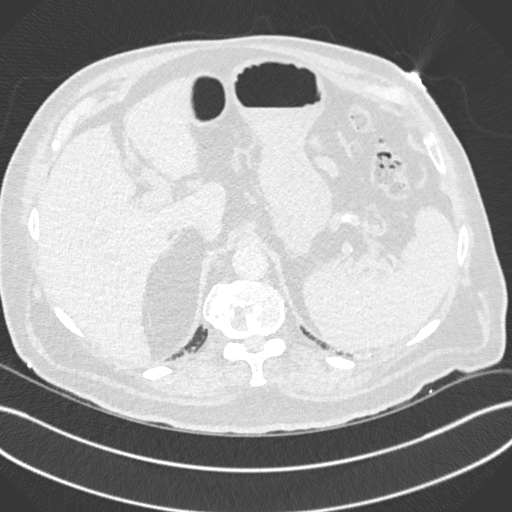
[im 41/402  mediastinal]
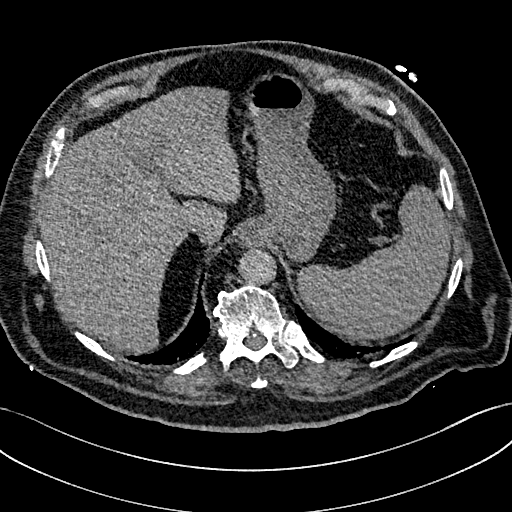
[im 61/402  lung]
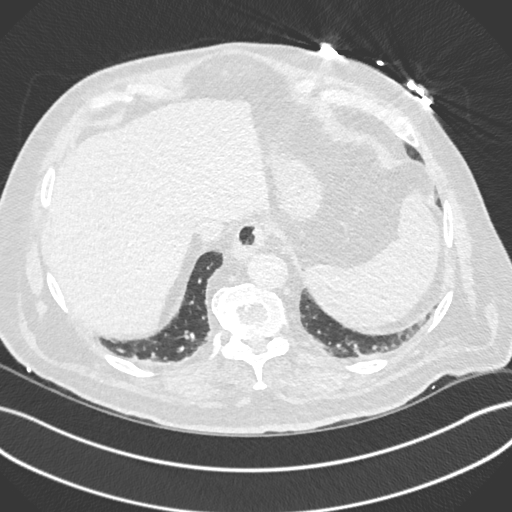
[im 81/402  mediastinal]
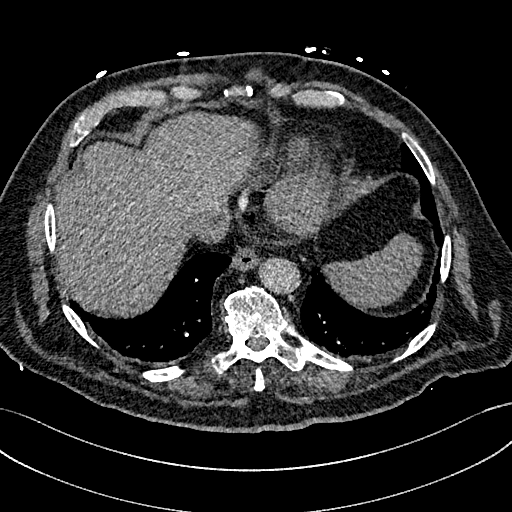
[im 121/402  lung]
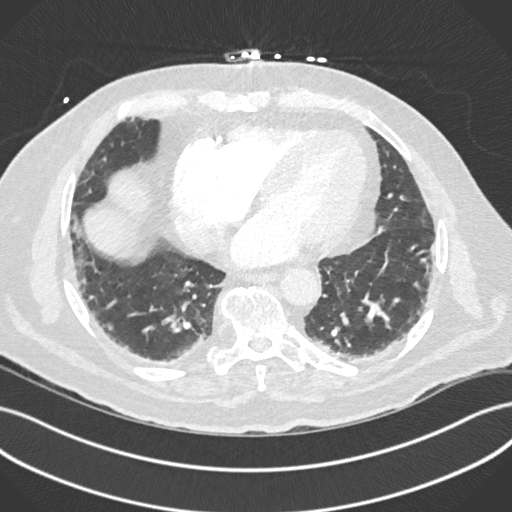
[im 141/402  mediastinal]
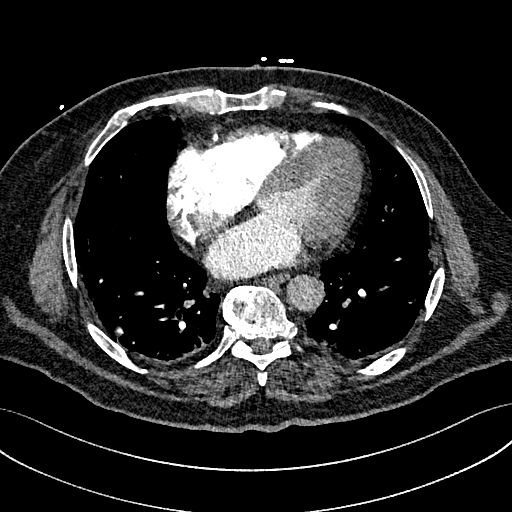
[im 161/402  lung]
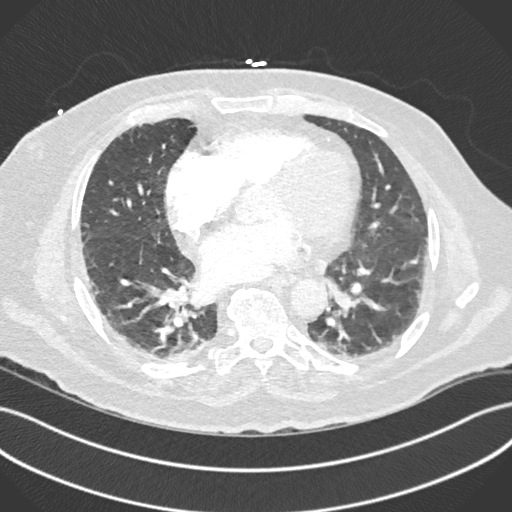
[im 181/402  mediastinal]
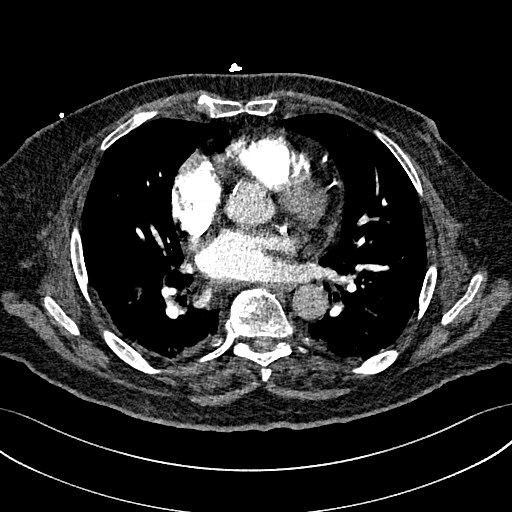
[im 201/402  lung]
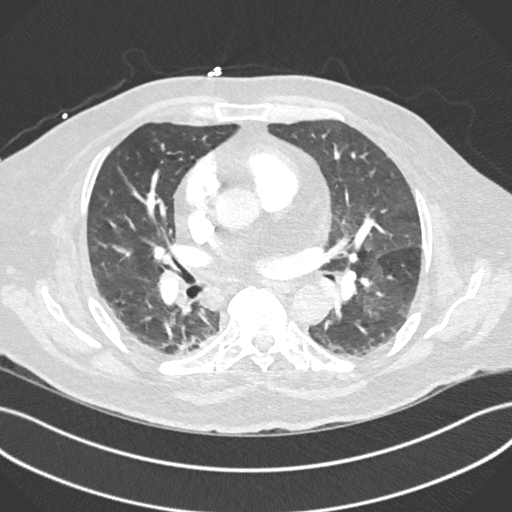
[im 221/402  mediastinal]
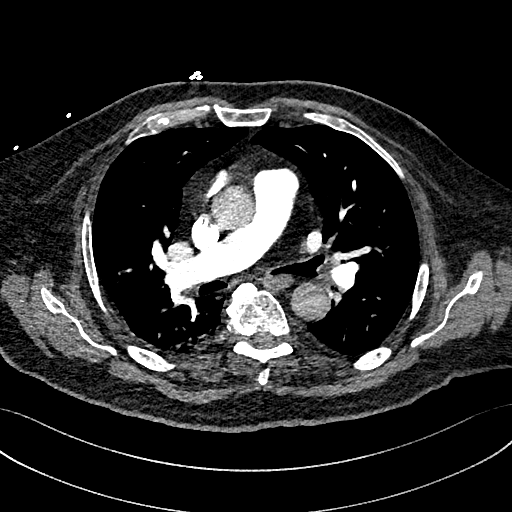
[im 241/402  lung]
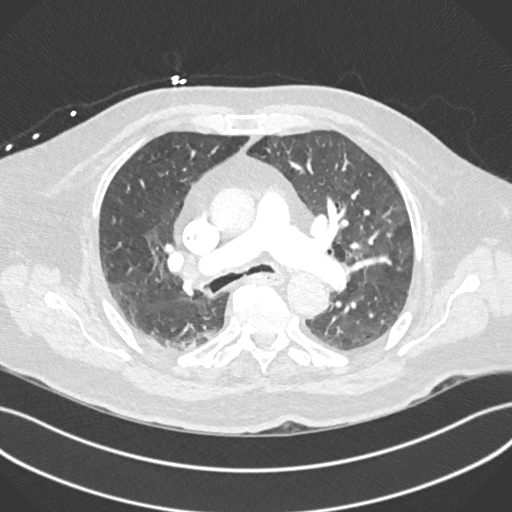
[im 261/402  mediastinal]
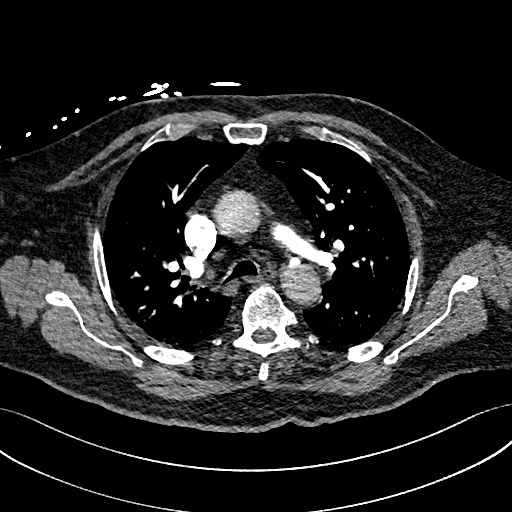
[im 281/402  lung]
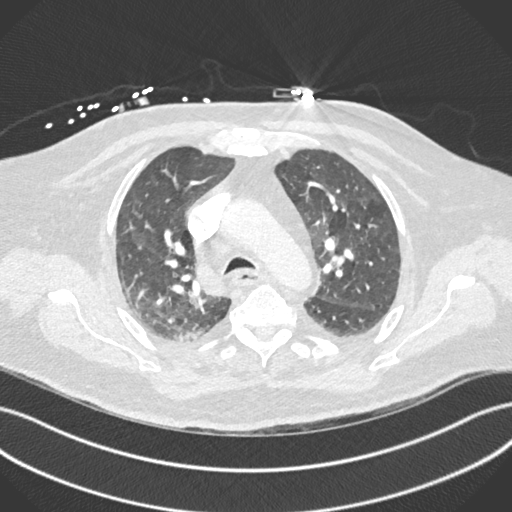
[im 321/402  mediastinal]
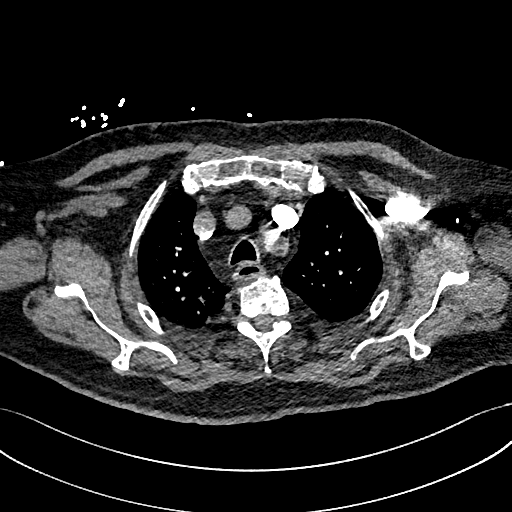
[im 341/402  lung]
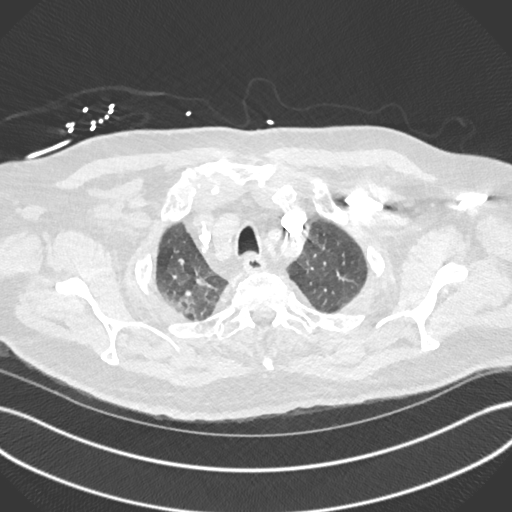
[im 361/402  mediastinal]
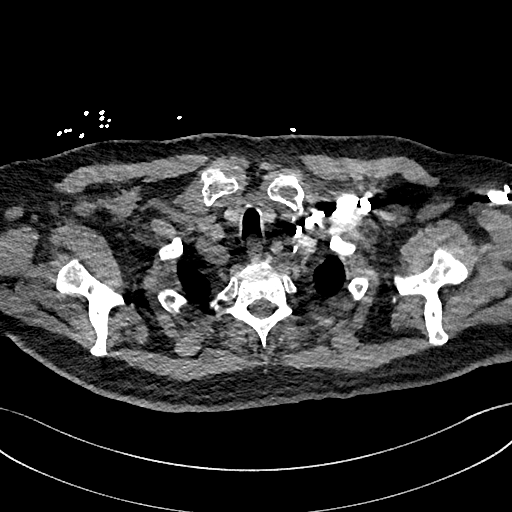
[im 381/402  lung]
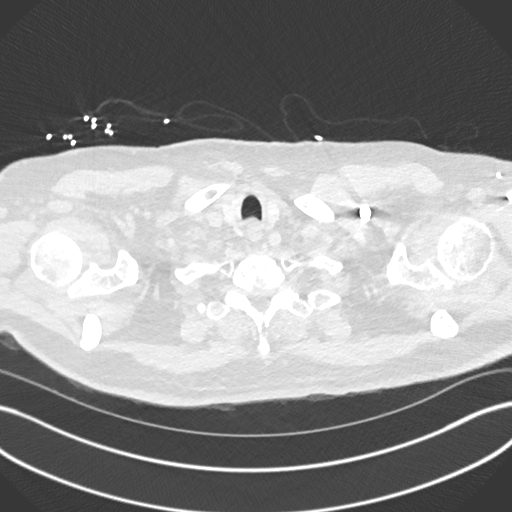

[Series 10: pe 2mm cor · coronal · 0.57mm/px · 1 of 144 slices shown]
[im 72/144  mediastinal]
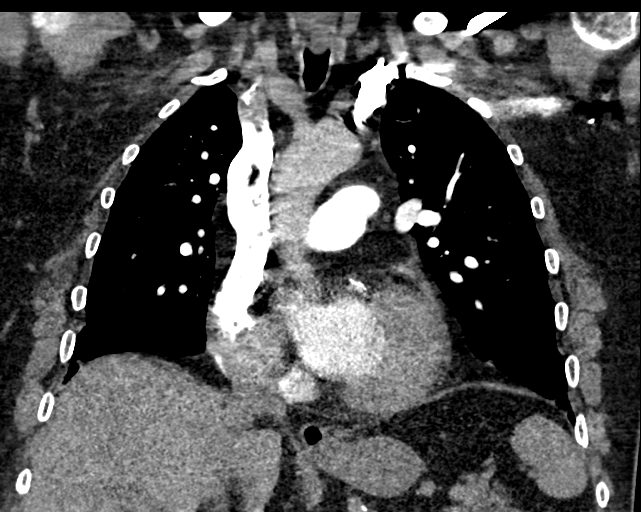

[18 of 36 positions shown; findings below may reference images not displayed]

FINDINGS: Cardiovascular: There is mild calcification of the aortic arch.
Satisfactory opacification of the pulmonary arteries to the
segmental level. An area of artifact is suspected overlying a lower
lobe branch of the right pulmonary artery (axial CT image 81, CT
series number 7). No true intraluminal filling defects are clearly
identified. Normal heart size with moderate to marked severity
coronary artery calcification. No pericardial effusion.

Mediastinum/Nodes: No enlarged mediastinal, hilar, or axillary lymph
nodes. Thyroid gland, trachea, and esophagus demonstrate no
significant findings.

Lungs/Pleura: Mild atelectasis is seen within the bilateral lung
bases.

A stable partially calcified lung nodule is seen within the
posterolateral aspect of the right lower lobe.

There is no evidence of a pleural effusion or pneumothorax.

Upper Abdomen: There is a small hiatal hernia.

Musculoskeletal: Multilevel degenerative changes are seen throughout
the thoracic spine.

Review of the MIP images confirms the above findings.
IMPRESSION: 1. Area of artifact suspected overlying a lower lobe branch of the
right pulmonary artery, without definite evidence of pulmonary
embolism. Correlation with follow-up chest CTA or nuclear medicine
ventilation/perfusion scan is recommended if pulmonary embolism
remains of clinical concern.
2. Evidence of prior granulomatous disease.
3. Small hiatal hernia.
4. Aortic atherosclerosis.

Aortic Atherosclerosis ([OG]-[OG]).

## 2020-10-12 MED ORDER — IOHEXOL 350 MG/ML SOLN
80.0000 mL | Freq: Once | INTRAVENOUS | Status: AC | PRN
  Administered 2020-10-12: 80 mL via INTRAVENOUS

## 2020-10-12 MED ORDER — APIXABAN 5 MG PO TABS
5.0000 mg | ORAL_TABLET | Freq: Two times a day (BID) | ORAL | Status: DC
  Administered 2020-10-12 – 2020-10-15 (×6): 5 mg via ORAL
  Filled 2020-10-12 (×7): qty 1

## 2020-10-12 MED ORDER — METOPROLOL TARTRATE 25 MG PO TABS
25.0000 mg | ORAL_TABLET | Freq: Four times a day (QID) | ORAL | Status: DC
  Administered 2020-10-13 (×2): 25 mg via ORAL
  Filled 2020-10-12 (×2): qty 1

## 2020-10-12 MED ORDER — OFF THE BEAT BOOK
Freq: Once | Status: AC
  Filled 2020-10-12: qty 1

## 2020-10-12 MED ORDER — CHLORHEXIDINE GLUCONATE 0.12 % MT SOLN
15.0000 mL | Freq: Two times a day (BID) | OROMUCOSAL | Status: DC
  Administered 2020-10-12 – 2020-10-15 (×5): 15 mL via OROMUCOSAL
  Filled 2020-10-12 (×5): qty 15

## 2020-10-12 MED ORDER — ORAL CARE MOUTH RINSE
15.0000 mL | Freq: Two times a day (BID) | OROMUCOSAL | Status: DC
  Administered 2020-10-12 – 2020-10-13 (×3): 15 mL via OROMUCOSAL

## 2020-10-12 NOTE — Evaluation (Signed)
Occupational Therapy Evaluation Patient Details Name: Ryan Garcia MRN: [id number] DOB: 1933/08/13 Today's Date: 10/12/2020    History of Present Illness 87 y.o. male presents to [facility] ED on 10/11/2020 with reports of L-sided chest pain. Pt noted to have intermittent Afib in ED. PMH includes OA, CAD, chronic back pain, DM, GERD, skin cancer, NSTEMI, OSA, scoliosis.   Clinical Impression   Patient admitted for the diagnosis above.  Patient remains tachy with HR 135 to 136 throughout.  Patient with mild unsteadiness this date, although he was sleeping soundly when I entered the room.  PTA he was largely independent with care and used a can for mobility on uneven surfaces.  His spouse is able to assist 24/7 as needed at home.  OT will follow in the acute setting to ensure a safe transition home.  No OT anticipated post acute.      Follow Up Recommendations  No OT follow up    Equipment Recommendations  None recommended by OT    Recommendations for Other Services       Precautions / Restrictions Precautions Precautions: Fall Restrictions Weight Bearing Restrictions: No      Mobility Bed Mobility Overal bed mobility: Modified Independent               Patient Response: Cooperative  Transfers Overall transfer level: Needs assistance Equipment used: None Transfers: Sit to/from [facility] Sit to Stand: Supervision Stand pivot transfers: Supervision       General transfer comment: LOB backwards noted with Min Guard to stabilize.    Balance           Standing balance support: No upper extremity supported Standing balance-Leahy Scale: Fair                             ADL either performed or assessed with clinical judgement   ADL Overall ADL's : Needs assistance/impaired     Grooming: Set up;Standing           Upper Body Dressing : Set up;Sitting   Lower Body Dressing: Min guard;Sit to/from stand                Functional mobility during ADLs: Supervision/safety       Vision Baseline Vision/History: Wears glasses Patient Visual Report: No change from baseline       Perception     Praxis      Pertinent Vitals/Pain Pain Assessment: No/denies pain     Hand Dominance Right   Extremity/Trunk Assessment Upper Extremity Assessment Upper Extremity Assessment: Overall WFL for tasks assessed   Lower Extremity Assessment Lower Extremity Assessment: Defer to PT evaluation   Cervical / Trunk Assessment Cervical / Trunk Assessment: Normal   Communication Communication Communication: HOH   Cognition Arousal/Alertness: Awake/alert Behavior During Therapy: WFL for tasks assessed/performed Overall Cognitive Status: Within Functional Limits for tasks assessed                                       Home Living Family/patient expects to be discharged to:: Private residence Living Arrangements: Spouse/significant other Available Help at Discharge: Family;Available PRN/intermittently Type of Home: House Home Access: Stairs to enter [employer] of Steps: 4 Entrance Stairs-Rails: Left Home Layout: Two level;Laundry or work area in basement     [employer] Shower/Tub: Teacher, early years/pre: Standard  Home Equipment: Cane - single point          Prior Functioning/Environment Level of Independence: Independent with assistive device(s)                 OT Problem List: Impaired balance (sitting and/or standing)      OT Treatment/Interventions: Self-care/ADL training;Balance training;Therapeutic activities    OT Goals(Current goals can be found in the care plan section) Acute Rehab OT Goals Patient Stated Goal: I want to get back to my shop out back OT Goal Formulation: With patient Time For Goal Achievement: 10/26/20 Potential to Achieve Goals: Good ADL Goals Pt Will Perform Grooming: Independently Pt Will Perform Lower Body Bathing:  Independently;sit to/from stand Pt Will Perform Lower Body Dressing: Independently;sit to/from stand Pt Will Transfer to Toilet: Independently;ambulating Pt Will Perform Toileting - Clothing Manipulation and hygiene: Independently;sit to/from stand  OT Frequency: Min 2X/week   Barriers to D/C:    none noted       Co-evaluation              AM-PAC OT "6 Clicks" Daily Activity     Outcome Measure Help from another person eating meals?: None Help from another person taking care of personal grooming?: None Help from another person toileting, which includes using toliet, bedpan, or urinal?: None Help from another person bathing (including washing, rinsing, drying)?: A Little Help from another person to put on and taking off regular upper body clothing?: None Help from another person to put on and taking off regular lower body clothing?: A Little 6 Click Score: 22   End of Session    Activity Tolerance: Patient tolerated treatment well Patient left: in chair;with call bell/phone within reach;with family/visitor present  OT Visit Diagnosis: Unsteadiness on feet (R26.81)                Time: 3159-4585 OT Time Calculation (min): 22 min Charges:  OT General Charges $OT Visit: 1 Visit OT Evaluation $OT Eval Moderate Complexity: 1 Mod  10/12/2020  Rich, OTR/L  Acute Rehabilitation Services  Office:  [phone]   Metta Clines 10/12/2020, 4:50 PM

## 2020-10-12 NOTE — TOC Benefit Eligibility Note (Signed)
Patient Teacher, English as a foreign language completed.    The patient is currently admitted and upon discharge could be taking Eliquis 5 mg.  The current 30 day co-pay is, $45.00.   The patient is currently admitted and upon discharge could be taking Xarelto 15 mg.  The current 30 day co-pay is, $45.00.   The patient is insured through [place], [place] Patient Advocate Specialist [place] Team Direct Number: [phone]  Fax: [phone]

## 2020-10-12 NOTE — Progress Notes (Signed)
  Echocardiogram 2D Echocardiogram has been performed.  Darlina Sicilian M 10/12/2020, 10:35 AM

## 2020-10-12 NOTE — Discharge Instructions (Signed)

## 2020-10-12 NOTE — Progress Notes (Signed)
PROGRESS NOTE    Ryan Garcia  OHY:[id number] DOB: [birth year] DOA: 10/11/2020 PCP: Abner Greenspan, MD   Chief Complaint  Patient presents with  . Chest Pain  Brief Narrative: 87 year old male with history of CAD status post previous stent, hard of hearing, diabetes hypertension, BPH, OSA, morbid obesity, osteoarthritis brought in by wife due to sudden onset of left-sided chest pain that is started in the afternoon of 4/12, pressure-like sharp in sensation also with palpitation. In the ED initial troponin was negative, vitals are stable but was tachycardic and found to be A. fib with RVR.  Chest x-ray no acute finding.  Cardiology was curb sided advised to increase beta-blocker.  Patient was placed on heparin patient and admitted.  Subjective: Seen this morning.  Appears hard of hearing remains persistently tachycardic-but has no new complaints.  Reports last night he had some mid chest discomfort. He was upset abt not getting his CPAP machine.   Assessment & Plan:  A. fib with RVR/Sinus tachycarda: Heart rate poorly controlled this morning.  Sinus tachycardia he has had A. fib overnight.  In the ED discussed with cardiology beta-blockers was escalated -on metoprolol 12.5 mg every 6 hours  And was placed on anticoagulation with heparin drip.  TSH elevated 8.4, check free T4.  Follow-up echocardiogram, cardiology has been consulted.   Chest Pain likely from #1, troponins negative.  CTA was done that did not show any acute finding, area of artifact suspected overlying the lower lobe branch of the right pulmonary artery without definite evidence of pulmonary embolism.  Patient has no chest pain today.  Ordered cardiology input.  Is on Imdur Crestor and aspirin.  CAD S/P percutaneous coronary angioplasty: Continue his Imdur Crestor aspirin.  Hyperlipidemia associated with type 2 diabetes mellitus: Follow lipid panel-LDL is stable 41 HDL low 31.Continue his statin.  GERD:  Stable  Uncontrolled type 2 diabetes mellitus with hyperglycemia: Continue glipizide, home Lantus 40 units and sliding scale insulin.Hemoglobin A1c uncontrolled at 8.0. Recent Labs  Lab 10/11/20 1814 10/11/20 2247 10/12/20 0746 10/12/20 1129  GLUCAP 178* 170* 174* 146*   Morbid obesity BMI 33:Will benefit with weight loss and PCP follow-up.  Diet Order            Diet heart healthy/carb modified Room service appropriate? Yes; Fluid consistency: Thin  Diet effective now               Patient's Body mass index is 33.86 kg/m. DVT prophylaxis: HEPARIN GTT Code Status:   Code Status: Full Code  Family Communication: plan of care discussed with patient at bedside. I spoke to patient's wife on the phone home has visited him with CPAP.  Status is: Inpatient Remains inpatient appropriate because:Inpatient level of care appropriate due to severity of illness Dispo: The patient is from: Home              Anticipated d/c is to: Home.  PT OT evaluation once heart rate improves              Patient currently is not medically stable to d/c.   Difficult to place patient No  Unresulted Labs (From admission, onward)          Start     Ordered   10/13/20 0500  Heparin level (unfractionated)  Daily,   R      10/11/20 2240   10/12/20 0700  Heparin level (unfractionated)  Once-Timed,   STAT        10/11/20 2240  10/12/20 0500  CBC  Tomorrow morning,   R        10/11/20 2211   10/12/20 0500  Lipid panel  Tomorrow morning,   R        10/11/20 2211   10/12/20 3419  Basic metabolic panel  Tomorrow morning,   R        10/11/20 2211          Medications reviewed:  Scheduled Meds: . aspirin EC  81 mg Oral Daily  . glipiZIDE  10 mg Oral BID AC  . insulin aspart  0-15 Units Subcutaneous TID WC  . insulin aspart  0-5 Units Subcutaneous QHS  . insulin glargine  44 Units Subcutaneous QHS  . isosorbide mononitrate  60 mg Oral Daily  . loratadine  10 mg Oral Daily  . metoprolol tartrate   12.5 mg Oral Q6H  . rosuvastatin  40 mg Oral QHS  . vitamin B-12  1,000 mcg Oral Daily   Continuous Infusions: . heparin 1,400 Units/hr (10/11/20 2321)    Consultants:see note  Procedures:see note  Antimicrobials: Anti-infectives (From admission, onward)   None     Culture/Microbiology  Other culture-see note  Objective: Vitals: Today's Vitals   10/12/20 0245 10/12/20 0425 10/12/20 0444 10/12/20 0639  BP: (!) 143/99 (!) 148/99 (!) 128/107 (!) 142/105  Pulse: (!) 128 (!) 125 (!) 127 (!) 127  Resp: 20 20 18 18   Temp: 98.9 F (37.2 C) 98.6 F (37 C) 98.6 F (37 C) 98.6 F (37 C)  TempSrc: Oral Oral Oral Oral  SpO2: 96% 93% 95% 98%  Weight: 111.7 kg     Height: 5' 11.5" (1.816 m)     PainSc: 0-No pain 0-No pain      Intake/Output Summary (Last 24 hours) at 10/12/2020 0821 Last data filed at 10/12/2020 0700 Gross per 24 hour  Intake 391.89 ml  Output 800 ml  Net -408.11 ml   Filed Weights   10/11/20 1730 10/12/20 0245  Weight: 108.9 kg 111.7 kg   Weight change:   Intake/Output from previous day: 04/12 0701 - 04/13 0700 In: 391.9 [P.O.:240; I.V.:151.9] Out: 800 [Urine:800] Intake/Output this shift: No intake/output data recorded. Filed Weights   10/11/20 1730 10/12/20 0245  Weight: 108.9 kg 111.7 kg    Examination: General exam: AAO, hard of hearing,NAD, weak appearing. HEENT:Oral mucosa moist, Ear/Nose WNL grossly,dentition normal. Respiratory system: bilaterally diminished,no use of accessory muscle, non tender. Cardiovascular system: S1 & S2 +, tachycardic,No JVD. Gastrointestinal system: Abdomen soft, NT,ND, BS+. Nervous System:Alert, awake, moving extremities and grossly nonfocal Extremities: No edema, distal peripheral pulses palpable.  Skin: No rashes,no icterus. MSK: Normal muscle bulk,tone, power  Data Reviewed: I have personally reviewed following labs and imaging studies CBC: Recent Labs  Lab 10/11/20 1737  WBC 6.5  NEUTROABS 4.2  HGB  11.8*  HCT 35.9*  MCV 94.2  PLT 379   Basic Metabolic Panel: Recent Labs  Lab 10/11/20 1737  NA 140  K 4.5  CL 115*  CO2 21*  GLUCOSE 175*  BUN 20  CREATININE 1.01  CALCIUM 7.4*   GFR: Estimated Creatinine Clearance: 69.8 mL/min (by C-G formula based on SCr of 1.01 mg/dL). Liver Function Tests: Recent Labs  Lab 10/11/20 1737  AST 30  ALT 14  ALKPHOS 54  BILITOT 0.7  PROT 4.9*  ALBUMIN 2.8*   No results for input(s): LIPASE, AMYLASE in the last 168 hours. No results for input(s): AMMONIA in the last 168 hours. Coagulation Profile:  No results for input(s): INR, PROTIME in the last 168 hours. Cardiac Enzymes: No results for input(s): CKTOTAL, CKMB, CKMBINDEX, TROPONINI in the last 168 hours. BNP (last 3 results) No results for input(s): PROBNP in the last 8760 hours. HbA1C: Recent Labs    10/11/20 2211  HGBA1C 8.0*   CBG: Recent Labs  Lab 10/11/20 1814 10/11/20 2247 10/12/20 0746  GLUCAP 178* 170* 174*   Lipid Profile: No results for input(s): CHOL, HDL, LDLCALC, TRIG, CHOLHDL, LDLDIRECT in the last 72 hours. Thyroid Function Tests: No results for input(s): TSH, T4TOTAL, FREET4, T3FREE, THYROIDAB in the last 72 hours. Anemia Panel: No results for input(s): VITAMINB12, FOLATE, FERRITIN, TIBC, IRON, RETICCTPCT in the last 72 hours. Sepsis Labs: No results for input(s): PROCALCITON, LATICACIDVEN in the last 168 hours.  Recent Results (from the past 240 hour(s))  SARS CORONAVIRUS 2 (TAT 6-24 HRS) Nasopharyngeal Nasopharyngeal Swab     Status: None   Collection Time: 10/11/20  9:54 PM   Specimen: Nasopharyngeal Swab  Result Value Ref Range Status   SARS Coronavirus 2 NEGATIVE NEGATIVE Final    Comment: (NOTE) SARS-CoV-2 target nucleic acids are NOT DETECTED.  The SARS-CoV-2 RNA is generally detectable in upper and lower respiratory specimens during the acute phase of infection. Negative results do not preclude SARS-CoV-2 infection, do not rule  out co-infections with other pathogens, and should not be used as the sole basis for treatment or other patient management decisions. Negative results must be combined with clinical observations, patient history, and epidemiological information. The expected result is Negative.  Fact Sheet for Patients: SugarRoll.be  Fact Sheet for Healthcare Providers: https://www.woods-mathews.com/  This test is not yet approved or cleared by the [place] FDA and  has been authorized for detection and/or diagnosis of SARS-CoV-2 by FDA under an Emergency Use Authorization (EUA). This EUA will remain  in effect (meaning this test can be used) for the duration of the COVID-19 declaration under Se ction 564(b)(1) of the Act, 21 U.S.C. section 360bbb-3(b)(1), unless the authorization is terminated or revoked sooner.  Performed at [facility], [place] [address] [place], [place] [address]      Radiology Studies: CT ANGIO CHEST PE W OR WO CONTRAST  Result Date: 10/12/2020 CLINICAL DATA:  Suspected pulmonary embolism. EXAM: CT ANGIOGRAPHY CHEST WITH CONTRAST TECHNIQUE: Multidetector CT imaging of the chest was performed using the standard protocol during bolus administration of intravenous contrast. Multiplanar CT image reconstructions and MIPs were obtained to evaluate the vascular anatomy. CONTRAST:  48mL OMNIPAQUE IOHEXOL 350 MG/ML SOLN COMPARISON:  April 14, 2020 FINDINGS: Cardiovascular: There is mild calcification of the aortic arch. Satisfactory opacification of the pulmonary arteries to the segmental level. An area of artifact is suspected overlying a lower lobe branch of the right pulmonary artery (axial CT image 81, CT series number 7). No true intraluminal filling defects are clearly identified. Normal heart size with moderate to marked severity coronary artery calcification. No pericardial effusion. Mediastinum/Nodes: No enlarged mediastinal,  hilar, or axillary lymph nodes. Thyroid gland, trachea, and esophagus demonstrate no significant findings. Lungs/Pleura: Mild atelectasis is seen within the bilateral lung bases. A stable partially calcified lung nodule is seen within the posterolateral aspect of the right lower lobe. There is no evidence of a pleural effusion or pneumothorax. Upper Abdomen: There is a small hiatal hernia. Musculoskeletal: Multilevel degenerative changes are seen throughout the thoracic spine. Review of the MIP images confirms the above findings. IMPRESSION: 1. Area of artifact suspected overlying a lower  lobe branch of the right pulmonary artery, without definite evidence of pulmonary embolism. Correlation with follow-up chest CTA or nuclear medicine ventilation/perfusion scan is recommended if pulmonary embolism remains of clinical concern. 2. Evidence of prior granulomatous disease. 3. Small hiatal hernia. 4. Aortic atherosclerosis. Aortic Atherosclerosis (ICD10-I70.0). Electronically Signed   By: Virgina Norfolk M.D.   On: 10/12/2020 01:52   DG Chest Port 1 View  Result Date: 10/11/2020 CLINICAL DATA:  Chest pain EXAM: PORTABLE CHEST 1 VIEW COMPARISON:  04/14/2020 FINDINGS: Borderline cardiomegaly. No focal airspace disease or effusion. Aortic atherosclerosis. No pneumothorax. IMPRESSION: No active disease. Borderline cardiomegaly. Electronically Signed   By: Donavan Foil M.D.   On: 10/11/2020 18:21     LOS: 1 day   Antonieta Pert, MD [facility]  10/12/2020, 8:21 AM

## 2020-10-12 NOTE — Progress Notes (Signed)
[facility] for heparin Indication: atrial fibrillation  Allergies  Allergen Reactions  . Loratadine Other (See Comments)    Not effective  . Simvastatin Other (See Comments)    Intolerant- caused joint pain    Patient Measurements: Height: 5' 11.5" (181.6 cm) Weight: 111.7 kg (246 lb 3.2 oz) IBW/kg (Calculated) : 76.45 Heparin Dosing Weight: 98.5 kg  Vital Signs: Temp: 98.2 F (36.8 C) (04/13 0809) Temp Source: Oral (04/13 0809) BP: 149/96 (04/13 0809) Pulse Rate: 129 (04/13 0809)  Labs: Recent Labs    10/11/20 1737 10/11/20 2042 10/12/20 0720  HGB 11.8*  --   --   HCT 35.9*  --   --   PLT 156  --   --   HEPARINUNFRC  --   --  0.29*  CREATININE 1.01  --  1.02  TROPONINIHS 8 14  --     Estimated Creatinine Clearance: 69.1 mL/min (by C-G formula based on SCr of 1.02 mg/dL).   Medical History: Past Medical History:  Diagnosis Date  . AC (acromioclavicular) joint bone spurs    lt ankle  . Allergy    allergic rhinitis  . Anginal pain ([place])   . Arthritis    OA  . BPH (benign prostatic hyperplasia)    microwave tx of prostate  . CAD (coronary artery disease)    a. s/p cath in 2012 showing 70-75% LAD stenosis, 75% D1 stenosis, 75% OM1, 60-70% RCA stenosis, and 80% PDA --> medical therapy pursued b. similar findings by cath in 2016; 06/2017 DES to distal RCA  . Chronic upper back pain   . Diabetes mellitus    type II x8 years  . GERD (gastroesophageal reflux disease)   . HLD (hyperlipidemia)    10/12: TC 208, TG 213, HDL 36, LDL 129  . Hx of skin cancer, basal cell    many skin cancers removed/under constant treatment.  . Hypertension   . NSTEMI (non-ST elevated myocardial infarction) ([place]) 06/18/2017   DES to distal RCA  . Obstructive sleep apnea on CPAP   . Scoliosis     Medications:  Scheduled:  . aspirin EC  81 mg Oral Daily  . glipiZIDE  10 mg Oral BID AC  . insulin aspart  0-15 Units Subcutaneous TID WC  .  insulin aspart  0-5 Units Subcutaneous QHS  . insulin glargine  44 Units Subcutaneous QHS  . isosorbide mononitrate  60 mg Oral Daily  . loratadine  10 mg Oral Daily  . metoprolol tartrate  12.5 mg Oral Q6H  . rosuvastatin  40 mg Oral QHS  . vitamin B-12  1,000 mcg Oral Daily    Assessment: 32 yom was having chest fullness, found to have elevated HR in Afib - no AC PTA (CHADSVASC= 5). Pharmacy dosing heparin  -heparin level= 0.29   Goal of Therapy:  Heparin level 0.3-0.7 units/ml Monitor platelets by anticoagulation protocol: Yes   Plan:  -Increase heparin to 1500 units/hr -Recheck heparin level in 8 hours -Will follow plans for oral anticoagulation  Hildred Laser, PharmD Clinical Pharmacist **Pharmacist phone directory can now be found on [email or web address] ([email or web address]).  Listed under [place].

## 2020-10-12 NOTE — Progress Notes (Signed)
[place] for Transition from heparin to Eliquis Indication: atrial fibrillation  Allergies  Allergen Reactions  . Loratadine Other (See Comments)    Not effective  . Simvastatin Other (See Comments)    Intolerant- caused joint pain    Patient Measurements: Height: 5' 11.5" (181.6 cm) Weight: 111.7 kg (246 lb 3.2 oz) IBW/kg (Calculated) : 76.45 Heparin Dosing Weight: 98.5 kg  Vital Signs: Temp: 98.4 F (36.9 C) (04/13 1133) Temp Source: Oral (04/13 1133) BP: 160/77 (04/13 1149) Pulse Rate: 135 (04/13 1149)  Labs: Recent Labs    10/11/20 1737 10/11/20 2042 10/12/20 0720 10/12/20 0927  HGB 11.8*  --   --  14.5  HCT 35.9*  --   --  44.8  PLT 156  --   --  144*  HEPARINUNFRC  --   --  0.29*  --   CREATININE 1.01  --  1.02  --   TROPONINIHS 8 14  --   --     Estimated Creatinine Clearance: 69.1 mL/min (by C-G formula based on SCr of 1.02 mg/dL).   Medical History: Past Medical History:  Diagnosis Date  . AC (acromioclavicular) joint bone spurs    lt ankle  . Allergy    allergic rhinitis  . Anginal pain ([place])   . Arthritis    OA  . BPH (benign prostatic hyperplasia)    microwave tx of prostate  . CAD (coronary artery disease)    a. s/p cath in 2012 showing 70-75% LAD stenosis, 75% D1 stenosis, 75% OM1, 60-70% RCA stenosis, and 80% PDA --> medical therapy pursued b. similar findings by cath in 2016; 06/2017 DES to distal RCA  . Chronic upper back pain   . Diabetes mellitus    type II x8 years  . GERD (gastroesophageal reflux disease)   . HLD (hyperlipidemia)    10/12: TC 208, TG 213, HDL 36, LDL 129  . Hx of skin cancer, basal cell    many skin cancers removed/under constant treatment.  . Hypertension   . NSTEMI (non-ST elevated myocardial infarction) ([place]) 06/18/2017   DES to distal RCA  . Obstructive sleep apnea on CPAP   . Scoliosis     Medications:  Scheduled:  . apixaban  5 mg Oral BID  . aspirin EC  81 mg  Oral Daily  . glipiZIDE  10 mg Oral BID AC  . insulin aspart  0-15 Units Subcutaneous TID WC  . insulin aspart  0-5 Units Subcutaneous QHS  . insulin glargine  44 Units Subcutaneous QHS  . isosorbide mononitrate  60 mg Oral Daily  . loratadine  10 mg Oral Daily  . metoprolol tartrate  12.5 mg Oral Q6H  . rosuvastatin  40 mg Oral QHS  . vitamin B-12  1,000 mcg Oral Daily    Assessment: 41 yom was having chest fullness, found to have elevated HR in Afib - no AC PTA (CHADSVASC= 5). Currently on IV  heparin per pharmacy consult.   Now pharmacy consulted to transition from IV heparin to Eliquis for nonvalvular atrial fibrillation.  Patient age is >80,  SCr <1.5, wt >60 kg.  Appropriate eliquis dose is 5 mg BID.    Plan:  Discontinue IV heparin drip now and give Eliquis 5 mg  po BID Monitor for renal function changes and sign/symptoms of bleeding. Eliquis educational information posted on AVS Pharmacist will educate patient before discharge.   Nicole Cella, RPh Clinical Pharmacist  **Pharmacist phone directory can now  be found on [email or web address] ([email or web address]).  Listed under [place].

## 2020-10-12 NOTE — Evaluation (Signed)
Physical Therapy Evaluation Patient Details Name: Ryan Garcia MRN: [id number] DOB: 1934-04-14 Today's Date: 10/12/2020   History of Present Illness  87 y.o. male presents to [facility] ED on 10/11/2020 with reports of L-sided chest pain. Pt noted to have intermittent Afib in ED. PMH includes OA, CAD, chronic back pain, DM, GERD, skin cancer, NSTEMI, OSA, scoliosis.  Clinical Impression  Pt presents to PT with deficits in dynamic balance and higher level gait activities, but appears to be close to his baseline. Pt is tachycardic at rest and with mobility but denies pain or other symptoms. Pt is able to ambulate and transfer without physical assistance. Pt will benefit from continued acute PT services to provide high level balance and gait training to reduce falls risk. PT recommends outpatient PT referral at this time.    Follow Up Recommendations Outpatient PT    Equipment Recommendations  None recommended by PT    Recommendations for Other Services       Precautions / Restrictions Precautions Precautions: Fall Restrictions Weight Bearing Restrictions: No      Mobility  Bed Mobility Overal bed mobility: Modified Independent (HOB elevated)                  Transfers Overall transfer level: Independent Equipment used: None                Ambulation/Gait Ambulation/Gait assistance: Supervision Gait Distance (Feet): 250 Feet Assistive device: None Gait Pattern/deviations: Step-through pattern Gait velocity: functional Gait velocity interpretation: 1.31 - 2.62 ft/sec, indicative of limited community ambulator General Gait Details: pt with slowed step-through gait, mild increase in lateral drift  Stairs            Wheelchair Mobility    Modified Rankin (Stroke Patients Only)       Balance Overall balance assessment: Needs assistance Sitting-balance support: No upper extremity supported;Feet supported Sitting balance-Leahy Scale: Good     Standing  balance support: No upper extremity supported Standing balance-Leahy Scale: Fair                               Pertinent Vitals/Pain Pain Assessment: No/denies pain    Home Living Family/patient expects to be discharged to:: Private residence Living Arrangements: Spouse/significant other Available Help at Discharge: Family;Available PRN/intermittently Type of Home: House Home Access: Stairs to enter Entrance Stairs-Rails: Left Entrance Stairs-Number of Steps: 4 Home Layout: Two level;Laundry or work area in [place]: Kasandra Knudsen - single point (walking sticks)      Prior Function Level of Independence: Independent with assistive device(s)         Comments: pt utilizes cane or walking sticks on uneven surfaces, reports instability when ambulating outdoors     Hand Dominance        Extremity/Trunk Assessment   Upper Extremity Assessment Upper Extremity Assessment: Overall WFL for tasks assessed    Lower Extremity Assessment Lower Extremity Assessment: Overall WFL for tasks assessed    Cervical / Trunk Assessment Cervical / Trunk Assessment: Normal  Communication   Communication: HOH (has hearing aides)  Cognition Arousal/Alertness: Awake/alert Behavior During Therapy: WFL for tasks assessed/performed Overall Cognitive Status: Within Functional Limits for tasks assessed                                        General Comments General comments (skin integrity,  edema, etc.): pt tachy at rest to 132, HR observed from 130-132 with ambulation    Exercises     Assessment/Plan    PT Assessment Patient needs continued PT services  PT Problem List Decreased balance       PT Treatment Interventions Gait training;Balance training;Patient/family education;Neuromuscular re-education    PT Goals (Current goals can be found in the Care Plan section)  Acute Rehab PT Goals Patient Stated Goal: to improve balance on uneven  surfaces PT Goal Formulation: With patient Time For Goal Achievement: 10/26/20 Potential to Achieve Goals: Good Additional Goals Additional Goal #1: Pt will score >45/56 on BERG balance test and >19/24 on DGI to indicate a reduced risk for falls.    Frequency Min 3X/week   Barriers to discharge        Co-evaluation               AM-PAC PT "6 Clicks" Mobility  Outcome Measure Help needed turning from your back to your side while in a flat bed without using bedrails?: None Help needed moving from lying on your back to sitting on the side of a flat bed without using bedrails?: None Help needed moving to and from a bed to a chair (including a wheelchair)?: None Help needed standing up from a chair using your arms (e.g., wheelchair or bedside chair)?: None Help needed to walk in hospital room?: A Little Help needed climbing 3-5 steps with a railing? : A Little 6 Click Score: 22    End of Session Equipment Utilized During Treatment: Gait belt Activity Tolerance: Patient tolerated treatment well Patient left: in chair;with call bell/phone within reach;with family/visitor present Nurse Communication: Mobility status PT Visit Diagnosis: Unsteadiness on feet (R26.81);Other abnormalities of gait and mobility (R26.89)    Time: 4287-6811 PT Time Calculation (min) (ACUTE ONLY): 26 min   Charges:   PT Evaluation $PT Eval Low Complexity: 1 Low          Zenaida Niece, PT, DPT Acute Rehabilitation Pager: [phone]   Zenaida Niece 10/12/2020, 12:40 PM

## 2020-10-12 NOTE — Consult Note (Addendum)
Cardiology Consultation:   Patient ID: Ryan Garcia MRN: [id number]; DOB: [birth year]  Admit date: 10/11/2020 Date of Consult: 10/12/2020  PCP:  Abner Greenspan, MD   [facility]  Cardiologist:  Kirk Ruths, MD  Advanced Practice Provider:  No care team member to display Electrophysiologist:  None    Patient Profile:   Ryan Garcia is a 87 y.o. male wit PMH of diffuse three- vessel CAD s/p distal RCA stent on 06/18/2017 and on medical management due to refusal of CABG, hypertension, type 2 DM, hyperlipidemia, BPH, GERD, obesity, and OSA on CPAP, who presented to ER c/o chest pain and A fib, cardiology is consulted for this matter.   History of Present Illness:   Ryan Garcia has significant three-vessel CAD, has underwent multiple LHC (2012, 2016, 2018, and 2021) in the past.  He was seen last in the office by Dr. Stanford Breed on 07/25/20 for CAD follow up. Echo from 04/21/20 showed EF 60-65%,  Mild hypokinesis of the left ventricular, basal inferior wall. Grade I DD. Elevated left atrial pressure. RV systolic function and size normal. Mild dilation of bilateral atrial size. No significant valvular disease. His last LHC on 04/12/2020 showed 85% stenosed prox LAD, 85% stenosed Ost 1st Diag to 1st Diag , 65% stenosed dist LAD, 75% stenosed prox LAD to mid LAD, 80% stenosed Ost Cx to Mid Cx, 95% stenosed Ost 1s Mrg to 1st Mrg, 65% stenosed Mid Cx to Dist Cx, 75% stenosed 2nd Mrg, 60% stenosed RPDA, 70% stenosed 1st PRL, 30% stenosed prox RCA to mid RCA, patent dist RCA stent.  His severe left coronary distribution disease was felt unchanged, no PCI was suitable, and he was recommended medical therapy and re-consider CABG if refractory angina occurs. Previously he did receive dist RCA stent on 06/18/2017 LHC and was treated with 3 month DAPT. He was doing well without chest pain during last office visit and continue wish to avoid CABG, his imaging was reviewed with Dr. Percival Spanish  and Dr. Lia Foyer and decision was made to continue medical management with ASA, statin, BBlocker, and nitrates.   He came to ER 10/11/20 c/o chest pain. He was working in his sawmill for several hours and went for lunch. After lunch he started noticing some left sided sharp and pressure like chest pain. He also reported heart palpitations. He took 324m ASA at home.  He denied any associated SOB, nausea, vomiting, diaphoresis. He was found to be in sinus tachycardia with intermittent A fib at ED. He is subsequently admitted to hospital medicine, cardiology was curbside, he was escalated on beta blocker therapy for rate control and therapeutic anticoagulation was started with heparin gtt.   Work up thus far normocytic anemia with Hgb 11.8. albumin 2.8. Hs trop negative x2. LDL 57. Hgb A1C 8%. COVID swab negative. CTA chest showed no PE, granulomatous disease. Small hiatal hernia. Aortic atherosclerosis. EKG showed atrial flutter with ventricular rate of 122 bpm. Echo repeated 10/11/20 showed EF 55-60%, Mild basal inferior hypokinesis consistent with previous TTE, Severe concentric LVH.  Diastolic function is indeterminate. RV systolic function and size normal. No significant valvular disease noted. No significant change, but patient is in A fib with RVR.  He has remained afebrile, tachycardiac with HR 120-135s, hypertensive with BP 125/78 - 160/77, non-hypoxic over the past 24 hours. Cardiology is consulted today for further evaluation of A fib and chest pain.   Upon encounter, patient states he has been doing overall well until this  point. He reports chest pain generally occurs once a week. He does not take any medication and it resolves spontaneously. 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 His wife took his BP and noted his heart rate was fast at 129. Therefore he was brought to the ER for evaluation. He states his heart rate remained fast while he is in the hospital, but chest pain  has resolved. He endorses BLE chronic swelling, does not take any diuretics at baseline. He denied any dizziness, syncope, SOB, orthopnea. He is compliant with his CPAP use. He did not use CPAP last night because the machine the hospital provided is too old. Wife felt his blood pressure has been high at home since his antihypertensive had been reduced in Oct 2021.     Past Medical History:  Diagnosis Date  . AC (acromioclavicular) joint bone spurs    lt ankle  . Allergy    allergic rhinitis  . Anginal pain ([place])   . Arthritis    OA  . BPH (benign prostatic hyperplasia)    microwave tx of prostate  . CAD (coronary artery disease)    a. s/p cath in 2012 showing 70-75% LAD stenosis, 75% D1 stenosis, 75% OM1, 60-70% RCA stenosis, and 80% PDA --> medical therapy pursued b. similar findings by cath in 2016; 06/2017 DES to distal RCA  . Chronic upper back pain   . Diabetes mellitus    type II x8 years  . GERD (gastroesophageal reflux disease)   . HLD (hyperlipidemia)    10/12: TC 208, TG 213, HDL 36, LDL 129  . Hx of skin cancer, basal cell    many skin cancers removed/under constant treatment.  . Hypertension   . NSTEMI (non-ST elevated myocardial infarction) ([place]) 06/18/2017   DES to distal RCA  . Obstructive sleep apnea on CPAP   . Scoliosis     Past Surgical History:  Procedure Laterality Date  . BREAST SURGERY  2009   breast lump removed benign  . CARDIAC CATHETERIZATION N/A 03/15/2015   Procedure: Left Heart Cath and Coronary Angiography;  Surgeon: Belva Crome, MD;  Location: [facility];  Service: Cardiovascular;  Laterality: N/A;  . CORONARY STENT INTERVENTION N/A 06/18/2017   Procedure: CORONARY STENT INTERVENTION;  Surgeon: Jettie Booze, MD;  Location: [facility];  Service: Cardiovascular;  Laterality: N/A;  RCA  . LEFT HEART CATH AND CORONARY ANGIOGRAPHY N/A 06/18/2017   Procedure: LEFT HEART CATH AND CORONARY ANGIOGRAPHY;  Surgeon: Jettie Booze, MD;  Location: [facility];  Service: Cardiovascular;  Laterality: N/A;  . LEFT HEART CATH AND CORONARY ANGIOGRAPHY N/A 04/12/2020   Procedure: LEFT HEART CATH AND CORONARY ANGIOGRAPHY;  Surgeon: Martinique, Peter M, MD;  Location: [facility];  Service: Cardiovascular;  Laterality: N/A;  . ULTRASOUND GUIDANCE FOR VASCULAR ACCESS  06/18/2017   Procedure: Ultrasound Guidance For Vascular Access;  Surgeon: Jettie Booze, MD;  Location: [facility];  Service: Cardiovascular;;  right radial, right femoral     Home Medications:  Prior to Admission medications   Medication Sig Start Date End Date Taking? Authorizing Provider  acetaminophen (TYLENOL) 325 MG tablet Take 325-650 mg by mouth every 6 (six) hours as needed for mild pain or headache.   Yes [provider]  aspirin 81 MG tablet Take 81 mg by mouth daily.   Yes [provider]  cetirizine (ZYRTEC) 10 MG tablet TAKE 1 TABLET  EVERY DAY Patient taking differently: Take 10 mg by mouth daily as needed for allergies. 11/14/16  Yes Tower, Wynelle Fanny, MD  Coenzyme Q10 (CO Q 10 PO) Take 200 mg by mouth daily.   Yes [provider]  Cyanocobalamin (VITAMIN B12) 1000 MCG TBCR Take 1,000 mcg by mouth daily. 04/12/20  Yes Cheryln Manly, NP  fluticasone (FLONASE) 50 MCG/ACT nasal spray Place 1 spray into both nostrils daily as needed for allergies.    Yes [provider]  glipiZIDE (GLUCOTROL) 10 MG tablet TAKE 1 TABLET TWICE DAILY WITH MEALS Patient taking differently: Take 10 mg by mouth 2 (two) times daily before a meal. 08/02/20  Yes Tower, Wynelle Fanny, MD  isosorbide mononitrate (IMDUR) 60 MG 24 hr tablet Take 1 tablet (60 mg total) by mouth daily. 04/18/20  Yes Crenshaw, Denice Bors, MD  LANTUS SOLOSTAR 100 UNIT/ML Solostar Pen INJECT 40 UNITS INTO THE SKIN AT BEDTIME. Patient taking differently: Inject 44 Units into the skin at bedtime. 08/02/20  Yes Tower, Wynelle Fanny, MD  metFORMIN  (GLUCOPHAGE) 1000 MG tablet Take 1 tablet (1,000 mg total) by mouth 2 (two) times daily with a meal. 08/16/20  Yes Tower, Wynelle Fanny, MD  metoprolol tartrate (LOPRESSOR) 25 MG tablet Take 0.5 tablets (12.5 mg total) by mouth 2 (two) times daily. 05/25/20  Yes Lelon Perla, MD  nitroGLYCERIN (NITROSTAT) 0.4 MG SL tablet Place 1 tablet (0.4 mg total) under the tongue every 5 (five) minutes as needed for chest pain. 05/11/19  Yes Duke, Tami Lin, PA  rosuvastatin (CRESTOR) 40 MG tablet Take 1 tablet (40 mg total) by mouth daily. Patient taking differently: Take 40 mg by mouth at bedtime. 08/25/20  Yes Lelon Perla, MD  Blood Glucose Monitoring Suppl (TRUE METRIX AIR GLUCOSE METER) W/DEVICE KIT 1 kit by Other route 2 (two) times daily. Check blood sugar twice daily and as directed. Dx E11.65 03/17/15   Tower, Wynelle Fanny, MD  DROPLET PEN NEEDLES 31G X 6 MM MISC USE ONE TIME DAILY  AT  BEDTIME  WITH  LANTUS 10/07/20   Tower, Wynelle Fanny, MD  TRUE METRIX BLOOD GLUCOSE TEST test strip CHECK BLOOD SUGAR TWICE DAILY 12/15/19   Tower, Wynelle Fanny, MD  TRUEPLUS LANCETS 30G MISC Check blood sugar twice daily and as directed. Dx E11.65 03/17/15   Abner Greenspan, MD    Inpatient Medications: Scheduled Meds: . apixaban  5 mg Oral BID  . aspirin EC  81 mg Oral Daily  . glipiZIDE  10 mg Oral BID AC  . insulin aspart  0-15 Units Subcutaneous TID WC  . insulin aspart  0-5 Units Subcutaneous QHS  . insulin glargine  44 Units Subcutaneous QHS  . isosorbide mononitrate  60 mg Oral Daily  . loratadine  10 mg Oral Daily  . metoprolol tartrate  12.5 mg Oral Q6H  . rosuvastatin  40 mg Oral QHS  . vitamin B-12  1,000 mcg Oral Daily   Continuous Infusions:  PRN Meds: acetaminophen, acetaminophen, fluticasone, metoprolol tartrate, nitroGLYCERIN, ondansetron (ZOFRAN) IV  Allergies:    Allergies  Allergen Reactions  . Loratadine Other (See Comments)    Not effective  . Simvastatin Other (See Comments)    Intolerant-  caused joint pain    Social History:   Social History   Socioeconomic History  . Marital status: Married    Spouse name: Pancho Rushing  . Number of children: 3  . Years of education: Not on file  .  Highest education level: Not on file  Occupational History  . Not on file  Tobacco Use  . Smoking status: Never Smoker  . Smokeless tobacco: Never Used  Vaping Use  . Vaping Use: Never used  Substance and Sexual Activity  . Alcohol use: No    Alcohol/week: 0.0 standard drinks  . Drug use: No  . Sexual activity: Not Currently  Other Topics Concern  . Not on file  Social History Narrative   Married    Physically active hard of hearing   Social Determinants of Health   Financial Resource Strain: Medium Risk  . Difficulty of Paying Living Expenses: Somewhat hard  Food Insecurity: Not on file  Transportation Needs: Not on file  Physical Activity: Not on file  Stress: Not on file  Social Connections: Not on file  Intimate Partner Violence: Not on file    Family History:    Family History  Problem Relation Age of Onset  . Heart attack Brother   . Colon cancer Neg Hx   . Colon polyps Neg Hx   . Stomach cancer Neg Hx   . Esophageal cancer Neg Hx      ROS:   Constitutional: Denied fever, chills, malaise, night sweats Eyes: Denied vision change or loss Ears/Nose/Mouth/Throat: Denied ear ache, sore throat, coughing, sinus pain Cardiovascular: see HPI  Respiratory: denied shortness of breath Gastrointestinal: Denied nausea, vomiting, abdominal pain, diarrhea Genital/Urinary: Denied dysuria, hematuria, urinary frequency/urgency Musculoskeletal: Denied muscle ache, joint pain, weakness Skin: Denied rash, wound Neuro: Denied headache, dizziness, syncope Psych: Denied history of depression/anxiety  Endocrine: history of diabetes  Physical Exam/Data:   Vitals:   10/12/20 0809 10/12/20 1133 10/12/20 1149 10/12/20 1606  BP: (!) 149/96 (!) 160/77 (!) 160/77 (!) 144/53   Pulse: (!) 129 (!) 133 (!) 135 (!) 136  Resp: _0 Temp: 98.2 F (36.8 C) 98.4 F (36.9 C)    TempSrc: Oral Oral    SpO2: 99% 95%  98%  Weight:      Height:        Intake/Output Summary (Last 24 hours) at 10/12/2020 1610 Last data filed at 10/12/2020 1500 Gross per 24 hour  Intake 631.89 ml  Output 1400 ml  Net -768.11 ml   Last 3 Weights 10/12/2020 10/11/2020 08/16/2020  Weight (lbs) 246 lb 3.2 oz 240 lb 256 lb 2 oz  Weight (kg) 111.676 kg 108.863 kg 116.178 kg     Body mass index is 33.86 kg/m.   Vitals:  Vitals:   10/12/20 1149 10/12/20 1606  BP: (!) 160/77 (!) 144/53  Pulse: (!) 135 (!) 136  Resp:  16  Temp:    SpO2:  98%   General Appearance: In no apparent distress, laying in bed HEENT: Normocephalic, atraumatic. EOMs intact. Neck: Short and thick, trachea midline, no JVDs, no goiter noted, non-tender Cardiovascular: Regular rhythm, tachycardiac,  normal S1-S2,  no murmur/rub/gallop Respiratory: Resting breathing unlabored, lungs sounds clear to auscultation bilaterally, no use of accessory muscles. On room air.  No wheezes, rales or rhonchi.   Gastrointestinal: Bowel sounds positive, abdomen soft, non-tender, non-distended. No mass or organomegaly.  Extremities: Able to move all extremities in bed without difficulty, BLE 1-2 +edema Genitourinary: genital exam not performed Musculoskeletal: Normal muscle bulk and tone, muscle strength 5/5 throughout, no limited range of motion Skin: Intact, warm, dry. No rashes or petechiae noted in exposed areas.  Neurologic: Alert, oriented to person, place and time. Fluent speech,  no cognitive deficit, no  gross focal neuro deficit Psychiatric: Normal affect. Mood is appropriate.   EKG:  The EKG was personally reviewed and demonstrates: Atrial flutter with rate of 122 bpm   Telemetry:  Telemetry was personally reviewed and demonstrates:  Atrial flutter with rate of 120-130s   Relevant CV Studies:  Last Left heart  catheterization 04/12/2020:   Prox LAD lesion is 85% stenosed.  Ost 1st Diag to 1st Diag lesion is 85% stenosed.  Dist LAD lesion is 65% stenosed.  Prox LAD to Mid LAD lesion is 75% stenosed.  Ost Cx to Mid Cx lesion is 80% stenosed.  Ost 1st Mrg to 1st Mrg lesion is 95% stenosed.  Mid Cx to Dist Cx lesion is 65% stenosed.  2nd Mrg lesion is 75% stenosed.  RPDA lesion is 60% stenosed.  1st RPL lesion is 70% stenosed.  Prox RCA to Mid RCA lesion is 30% stenosed.  Previously placed Dist RCA drug eluting stent is widely patent.  Balloon angioplasty was performed.  LV end diastolic pressure is normal.   1. Severe 2 vessel obstructive CAD 2. Patent stent in the RCA 3. Normal LVEDP  Plan: the severe disease in the left coronary distribution is unchanged from prior. No suitable targets for PCI. Recommend continued medical therapy. If refractory angina would need to consider CABG.  Echo from 10/11/20:  1. Left ventricular ejection fraction, by estimation, is 55 to 60%. The left ventricle has normal function. The left ventricle demonstrates regional wall motion abnormalities. Mild basal inferior hypokinesis based on limited views consistent with prior TTE. There is severe concentric left ventricular hypertrophy. LV diastolic function is indeterminant due to atrial fibrilltion. 2. Right ventricular systolic function is normal. The right ventricular size is normal. Tricuspid regurgitation signal is inadequate for assessing PA pressure. 3. The mitral valve is normal in structure. Trivial mitral valve regurgitation. No evidence of mitral stenosis. 4. The aortic valve is tricuspid. Aortic valve regurgitation is not visualized. No aortic stenosis is present. 5. The inferior vena cava is normal in size with greater than 50% respiratory variability, suggesting right atrial pressure of 3 mmHg.  Comparison(s): Compared to prior TTE on 04/12/2020, the patient is now in Afib with RVR.  Otherwise, no significant change.   Laboratory Data:  High Sensitivity Troponin:   Recent Labs  Lab 10/11/20 1737 10/11/20 2042  TROPONINIHS 8 14     Chemistry Recent Labs  Lab 10/11/20 1737 10/12/20 0720  NA 140 139  K 4.5 4.1  CL 115* 108  CO2 21* 23  GLUCOSE 175* 154*  BUN 20 16  CREATININE 1.01 1.02  CALCIUM 7.4* 9.0  GFRNONAA >60 >60  ANIONGAP 4* 8    Recent Labs  Lab 10/11/20 1737  PROT 4.9*  ALBUMIN 2.8*  AST 30  ALT 14  ALKPHOS 54  BILITOT 0.7   Hematology Recent Labs  Lab 10/11/20 1737 10/12/20 0927  WBC 6.5 6.9  RBC 3.81* 4.77  HGB 11.8* 14.5  HCT 35.9* 44.8  MCV 94.2 93.9  MCH 31.0 30.4  MCHC 32.9 32.4  RDW 15.8* 15.7*  PLT 156 144*   BNPNo results for input(s): BNP, PROBNP in the last 168 hours.  DDimer No results for input(s): DDIMER in the last 168 hours.   Radiology/Studies:  CT ANGIO CHEST PE W OR WO CONTRAST  Result Date: 10/12/2020 CLINICAL DATA:  Suspected pulmonary embolism. EXAM: CT ANGIOGRAPHY CHEST WITH CONTRAST TECHNIQUE: Multidetector CT imaging of the chest was performed using the standard protocol during bolus administration  of intravenous contrast. Multiplanar CT image reconstructions and MIPs were obtained to evaluate the vascular anatomy. CONTRAST:  57m OMNIPAQUE IOHEXOL 350 MG/ML SOLN COMPARISON:  April 14, 2020 FINDINGS: Cardiovascular: There is mild calcification of the aortic arch. Satisfactory opacification of the pulmonary arteries to the segmental level. An area of artifact is suspected overlying a lower lobe branch of the right pulmonary artery (axial CT image 81, CT series number 7). No true intraluminal filling defects are clearly identified. Normal heart size with moderate to marked severity coronary artery calcification. No pericardial effusion. Mediastinum/Nodes: No enlarged mediastinal, hilar, or axillary lymph nodes. Thyroid gland, trachea, and esophagus demonstrate no significant findings. Lungs/Pleura: Mild  atelectasis is seen within the bilateral lung bases. A stable partially calcified lung nodule is seen within the posterolateral aspect of the right lower lobe. There is no evidence of a pleural effusion or pneumothorax. Upper Abdomen: There is a small hiatal hernia. Musculoskeletal: Multilevel degenerative changes are seen throughout the thoracic spine. Review of the MIP images confirms the above findings. IMPRESSION: 1. Area of artifact suspected overlying a lower lobe branch of the right pulmonary artery, without definite evidence of pulmonary embolism. Correlation with follow-up chest CTA or nuclear medicine ventilation/perfusion scan is recommended if pulmonary embolism remains of clinical concern. 2. Evidence of prior granulomatous disease. 3. Small hiatal hernia. 4. Aortic atherosclerosis. Aortic Atherosclerosis (ICD10-I70.0). Electronically Signed   By: TVirgina NorfolkM.D.   On: 10/12/2020 01:52   DG Chest Port 1 View  Result Date: 10/11/2020 CLINICAL DATA:  Chest pain EXAM: PORTABLE CHEST 1 VIEW COMPARISON:  04/14/2020 FINDINGS: Borderline cardiomegaly. No focal airspace disease or effusion. Aortic atherosclerosis. No pneumothorax. IMPRESSION: No active disease. Borderline cardiomegaly. Electronically Signed   By: KDonavan FoilM.D.   On: 10/11/2020 18:21   ECHOCARDIOGRAM COMPLETE  Result Date: 10/12/2020    ECHOCARDIOGRAM REPORT   Patient Name:   Ryan DOMEIERDate of Exam: 10/12/2020 Medical Rec #:  [id number]    Height:       71.5 in Accession #:    [id number]   Weight:       246.2 lb Date of Birth:  808-07-1933    BSA:          2.315 m Patient Age:    89years      BP:           146/105 mmHg Patient Gender: M             HR:           129 bpm. Exam Location:  Inpatient Procedure: 2D Echo, Color Doppler and Cardiac Doppler Indications:    Atrial Fibrillation I48.91  History:        Patient has prior history of Echocardiogram examinations, most                 recent 04/12/2020. CAD and Previous  Myocardial Infarction; Risk                 Factors:Dyslipidemia, Diabetes, Hypertension and Sleep Apnea.  Sonographer:    TDarlina SicilianRDCS Referring Phys: 2[place] 1. Left ventricular ejection fraction, by estimation, is 55 to 60%. The left ventricle has normal function. The left ventricle demonstrates regional wall motion abnormalities. Mild basal inferior hypokinesis based on limited views consistent with prior TTE. There is severe concentric left ventricular hypertrophy. LV diastolic function is indeterminant due to atrial fibrilltion.  2. Right ventricular systolic function  is normal. The right ventricular size is normal. Tricuspid regurgitation signal is inadequate for assessing PA pressure.  3. The mitral valve is normal in structure. Trivial mitral valve regurgitation. No evidence of mitral stenosis.  4. The aortic valve is tricuspid. Aortic valve regurgitation is not visualized. No aortic stenosis is present.  5. The inferior vena cava is normal in size with greater than 50% respiratory variability, suggesting right atrial pressure of 3 mmHg. Comparison(s): Compared to prior TTE on 04/12/2020, the patient is now in Afib with RVR. Otherwise, no significant change. FINDINGS  Left Ventricle: Left ventricular ejection fraction, by estimation, is 55 to 60%. The left ventricle has normal function. The left ventricle demonstrates regional wall motion abnormalities. Mild basal inferior hypokinesis based on limited views consistent with prior TTE. The left ventricular internal cavity size was normal in size. There is severe concentric left ventricular hypertrophy. LV diastolic function is indeterminant due to atrial fibrilltion. Right Ventricle: The right ventricular size is normal. Right vetricular wall thickness was not well visualized. Right ventricular systolic function is normal. Tricuspid regurgitation signal is inadequate for assessing PA pressure. Left Atrium: Left atrial size  was normal in size. Right Atrium: Right atrial size was normal in size. Pericardium: There is no evidence of pericardial effusion. Mitral Valve: The mitral valve is normal in structure. Trivial mitral valve regurgitation. No evidence of mitral valve stenosis. Tricuspid Valve: The tricuspid valve is normal in structure. Tricuspid valve regurgitation is trivial. Aortic Valve: The aortic valve is tricuspid. Aortic valve regurgitation is not visualized. No aortic stenosis is present. Pulmonic Valve: The pulmonic valve was not well visualized. Pulmonic valve regurgitation is trivial. Aorta: The aortic root and ascending aorta are structurally normal, with no evidence of dilitation. Venous: The inferior vena cava is normal in size with greater than 50% respiratory variability, suggesting right atrial pressure of 3 mmHg. IAS/Shunts: No atrial level shunt detected by color flow Doppler.  LEFT VENTRICLE PLAX 2D LVIDd:         4.20 cm LVIDs:         3.20 cm LV PW:         1.30 cm LV IVS:        1.60 cm LVOT diam:     2.00 cm LV SV:         44 LV SV Index:   19 LVOT Area:     3.14 cm  RIGHT VENTRICLE RV S prime:     14.90 cm/s TAPSE (M-mode): 1.8 cm LEFT ATRIUM             Index       RIGHT ATRIUM           Index LA diam:        3.40 cm 1.47 cm/m  RA Area:     18.40 cm LA Vol (A2C):   43.2 ml 18.66 ml/m RA Volume:   42.50 ml  18.36 ml/m LA Vol (A4C):   36.0 ml 15.55 ml/m LA Biplane Vol: 40.9 ml 17.67 ml/m  AORTIC VALVE LVOT Vmax:   85.40 cm/s LVOT Vmean:  55.200 cm/s LVOT VTI:    0.139 m  AORTA Ao Root diam: 3.60 cm MITRAL VALVE MV Area (PHT): 7.00 cm     SHUNTS MV Decel Time: 108 msec     Systemic VTI:  0.14 m MV E velocity: 106.67 cm/s  Systemic Diam: 2.00 cm Gwyndolyn Kaufman MD Electronically signed by Gwyndolyn Kaufman MD Signature Date/Time: 10/12/2020/11:47:36 AM  Final      Assessment and Plan:   Atrial flutter with RVR  Chest pain in the setting of diffuse CAD - presented with left sided chest pain  and palpitation, after exertional activity for several hours. Hs trop negative x2. EKG initially felt to be sinus tachycardia, upon further review on telemetry strips this is more consistent with atrial flutter, with rate of 120-130 bpm. Echo repeated 10/11/20 showed EF 55-60%, Mild basal inferior hypokinesis consistent with previous TTE, Severe concentric LVH.  Diastolic function is indeterminate. RV systolic function and size normal. No significant valvular disease noted. No significant change, but patient is in A fib with RVR. Last LHC on 04/12/2020 showed severe left coronary distribution disease (see report above) was felt unchanged, no PCI was suitable, and he was recommended medical therapy and re-consider CABG if refractory angina occurs.  - chest pain is currently resolved, possible due to tachycardia and demand ischemia  - continue medical therapy for CAD with ASA, statin, BBlocker, and Imdur  - will check Mag level, added today, optimize electrolyte to goal of K >4 and Mag >2  - CHA2DS2VASc score 5 (HTN, CAD, age, DM), agree with therapeutic anticoagulation, discussed risk versus benefit with the patient and wife regarding anticoagulation, will transition from heparin gtt to Eliquis, appreciate pharmacy to dose  - plan for TEE with DCCV on Friday 10/14/20 once therapeutic with NOAC.   Shared Decision Making/Informed Consent The risks [stroke, cardiac arrhythmias rarely resulting in the need for a temporary or permanent pacemaker, skin irritation or burns, esophageal damage, perforation (1:10,000 risk), bleeding, pharyngeal hematoma as well as other potential complications associated with conscious sedation including aspiration, arrhythmia, respiratory failure and death], benefits (treatment guidance, restoration of normal sinus rhythm, diagnostic support) and alternatives of a transesophageal echocardiogram guided cardioversion were discussed in detail with Ryan Garcia and he is willing to  proceed.  Elevated TSH - TSH elevated 8.47, free T4 pending, outpatient labs had been normal in the past  - no goiter on exam - management per primary team   Hypertension  - BP elevated up to 160/77, wife reports uncontrolled HTN at home - continue metoprolol, may up titrate imdur for better BP control   Type 2 DM - A1C 8% 10/11/20, worsening comparing to Jan 2022 - agree holding metformin, continue glipizide, lantus, and novolog SSI AC HS - [facility] diet   Hyperlipidemia  - LDL 57 at goal, continue statin   OSA  - compliant with CPAP     Risk Assessment/Risk Scores:   HEAR Score (for undifferentiated chest pain):  HEAR Score: 4{  CHA2DS2-VASc Score = 5  This indicates a 7.2% annual risk of stroke. The patient's score is based upon: CHF History: No HTN History: Yes Diabetes History: Yes Stroke History: No Vascular Disease History: Yes Age Score: 2 Gender Score: 0      For questions or updates, please contact [place] Please consult [email or web address] for contact info under    Signed, Margie Billet, NP  10/12/2020 4:10 PM   Patient seen and examined.  Agree with above documentation.  Ryan Garcia is an 87 year old male with a history of multivessel CAD who has declined CABG and most recently had RCA stent in December 2018, T2DM, hypertension, hyperlipidemia, OSA on CPAP who we are consulted for evaluation of atrial fibrillation.  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  Describes as left-sided, sometimes sharp and sometimes pressure-like pain.  Also began having heart palpitations.  EKG shows atrial flutter, rate 126.  Initial vital signs notable for BP 149/84, SPO2 97% on room air.  Labs notable for creatinine 1.0, troponin 8 >14, hemoglobin 11.8.  Telemetry shows atrial flutter with rate 130.  On exam, patient is alert and oriented, tachycardic, regular rhythm, no murmurs, lungs CTAB, 1+ lower extremity edema.   Echocardiogram today shows EF 55 to 60%.  For his new onset atrial flutter with RVR, has been difficult to rate control.  Will increase metoprolol to 25 mg every 6 hours.  Atrial flutter is often difficult to rate control, recommend attempt at rhythm control.  Will switch from heparin drip to Eliquis 5 mg twice daily.  We will plan for TEE/DCCV on 4/15.  Donato Heinz, MD

## 2020-10-12 NOTE — Progress Notes (Signed)
   10/12/20 0245  Assess: MEWS Score  Temp 98.9 F (37.2 C)  BP (!) 143/99  Pulse Rate (!) 128  ECG Heart Rate (!) 128  Resp 20  SpO2 96 %  O2 Device Room Air  Assess: MEWS Score  MEWS Temp 0  MEWS Systolic 0  MEWS Pulse 2  MEWS RR 0  MEWS LOC 0  MEWS Score 2  MEWS Score Color Yellow  Assess: if the MEWS score is Yellow or Red  Were vital signs taken at a resting state? Yes  Focused Assessment No change from prior assessment  Early Detection of Sepsis Score *See Row Information* Medium  MEWS guidelines implemented *See Row Information* Yes  Treat  MEWS Interventions Administered scheduled meds/treatments;Escalated (See documentation below)  Pain Scale 0-10  Pain Score 0  Patients Stated Pain Goal 0  Take Vital Signs  Increase Vital Sign Frequency  Yellow: Q 2hr X 2 then Q 4hr X 2, if remains yellow, continue Q 4hrs  Escalate  MEWS: Escalate Yellow: discuss with charge nurse/RN and consider discussing with provider and RRT  Notify: Charge Nurse/RN  Name of Charge Nurse/RN Notified Crisoforo Oxford, RN  Date Charge Nurse/RN Notified 10/12/20  Time Charge Nurse/RN Notified 0305  Document  Patient Outcome Other (Comment) (Heart remains elevated despite interventions.  Awaiting cardiology consult.)  Progress note created (see row info) Yes

## 2020-10-13 DIAGNOSIS — I25119 Atherosclerotic heart disease of native coronary artery with unspecified angina pectoris: Secondary | ICD-10-CM

## 2020-10-13 DIAGNOSIS — I4892 Unspecified atrial flutter: Secondary | ICD-10-CM | POA: Diagnosis not present

## 2020-10-13 DIAGNOSIS — I4891 Unspecified atrial fibrillation: Secondary | ICD-10-CM | POA: Diagnosis not present

## 2020-10-13 DIAGNOSIS — I10 Essential (primary) hypertension: Secondary | ICD-10-CM | POA: Diagnosis not present

## 2020-10-13 LAB — BASIC METABOLIC PANEL
Anion gap: 10 (ref 5–15)
BUN: 19 mg/dL (ref 8–23)
CO2: 21 mmol/L — ABNORMAL LOW (ref 22–32)
Calcium: 8.9 mg/dL (ref 8.9–10.3)
Chloride: 109 mmol/L (ref 98–111)
Creatinine, Ser: 1.06 mg/dL (ref 0.61–1.24)
GFR, Estimated: >60 mL/min (ref >60)
Glucose, Bld: 137 mg/dL — ABNORMAL HIGH (ref 70–99)
Potassium: 3.9 mmol/L (ref 3.5–5.1)
Sodium: 140 mmol/L (ref 135–145)

## 2020-10-13 LAB — CBC
HCT: 43.1 % (ref 39.0–52.0)
Hemoglobin: 14.4 g/dL (ref 13.0–17.0)
MCH: 31 pg (ref 26.0–34.0)
MCHC: 33.4 g/dL (ref 30.0–36.0)
MCV: 92.9 fL (ref 80.0–100.0)
Platelets: 158 10*3/uL (ref 150–400)
RBC: 4.64 MIL/uL (ref 4.22–5.81)
RDW: 15.6 % — ABNORMAL HIGH (ref 11.5–15.5)
WBC: 8 10*3/uL (ref 4.0–10.5)
nRBC: 0 % (ref 0.0–0.2)

## 2020-10-13 LAB — GLUCOSE, CAPILLARY
Glucose-Capillary: 175 mg/dL — ABNORMAL HIGH (ref 70–99)
Glucose-Capillary: 232 mg/dL — ABNORMAL HIGH (ref 70–99)
Glucose-Capillary: 202 mg/dL — ABNORMAL HIGH (ref 70–99)
Glucose-Capillary: 187 mg/dL — ABNORMAL HIGH (ref 70–99)

## 2020-10-13 LAB — PROTIME-INR
INR: 1.2 (ref 0.8–1.2)
Prothrombin Time: 14.7 seconds (ref 11.4–15.2)

## 2020-10-13 MED ORDER — METOPROLOL TARTRATE 50 MG PO TABS
50.0000 mg | ORAL_TABLET | Freq: Four times a day (QID) | ORAL | Status: DC
  Administered 2020-10-13 – 2020-10-14 (×5): 50 mg via ORAL
  Filled 2020-10-13 (×5): qty 1

## 2020-10-13 MED ORDER — INSULIN GLARGINE 100 UNIT/ML ~~LOC~~ SOLN
24.0000 [IU] | Freq: Every day | SUBCUTANEOUS | Status: DC
  Administered 2020-10-13: 24 [IU] via SUBCUTANEOUS
  Filled 2020-10-13 (×2): qty 0.24

## 2020-10-13 NOTE — Progress Notes (Signed)
   10/13/20 0743  Assess: MEWS Score  Temp 97.8 F (36.6 C)  BP (!) 129/97  Pulse Rate (!) 133  Resp 16  Level of Consciousness Alert  SpO2 98 %  O2 Device Room Air  Patient Activity (if Appropriate) In bed  O2 Flow Rate (L/min) 0 L/min  Assess: MEWS Score  MEWS Temp 0  MEWS Systolic 0  MEWS Pulse 3  MEWS RR 0  MEWS LOC 0  MEWS Score 3  MEWS Score Color Yellow  Assess: if the MEWS score is Yellow or Red  Were vital signs taken at a resting state? Yes (Simultaneous filing. User may not have seen previous data.)  Focused Assessment No change from prior assessment (Simultaneous filing. User may not have seen previous data.)  Early Detection of Sepsis Score *See Row Information* Low (Simultaneous filing. User may not have seen previous data.)  MEWS guidelines implemented *See Row Information* No, previously yellow, continue vital signs every 4 hours (Simultaneous filing. User may not have seen previous data.)  Treat  MEWS Interventions Other (Comment)  Pain Scale 0-10  Pain Score 0  Take Vital Signs  Increase Vital Sign Frequency  Yellow: Q 2hr X 2 then Q 4hr X 2, if remains yellow, continue Q 4hrs  Escalate  MEWS: Escalate Yellow: discuss with charge nurse/RN and consider discussing with provider and RRT  Notify: Charge Nurse/RN  Name of Charge Nurse/RN Notified abigail  Date Charge Nurse/RN Notified 10/13/20  Time Charge Nurse/RN Notified [phone]

## 2020-10-13 NOTE — Care Management (Signed)
10-13-20 1452 Case Manager spoke with patient and wife regarding Outpatient PT. Patient has declined the services and feels like he gets enough activity throughout the day. Patient states he is very active. No further needs from Case Manager at this time. Bethena Roys, RN<BSN Case Manager

## 2020-10-13 NOTE — Anesthesia Preprocedure Evaluation (Addendum)
Anesthesia Evaluation  Patient identified by MRN, date of birth, ID band Patient awake    Reviewed: Allergy & Precautions, NPO status , Patient's Chart, lab work & pertinent test results  Airway Mallampati: II  TM Distance: >3 FB Neck ROM: Full    Dental  (+) Dental Advisory Given, Teeth Intact   Pulmonary sleep apnea ,    Pulmonary exam normal breath sounds clear to auscultation       Cardiovascular hypertension, Pt. on medications + angina + CAD and + Past MI  + dysrhythmias Atrial Fibrillation  Rhythm:Irregular Rate:Tachycardia  Echo  1. Left ventricular ejection fraction, by estimation, is 55 to 60%. The left ventricle has normal function. The left ventricle demonstrates regional wall motion abnormalities. Mild basal inferior hypokinesis based on limited views consistent with prior TTE. There is severe concentric left ventricular hypertrophy. LV diastolic function is indeterminant due to atrial fibrilltion.  2. Right ventricular systolic function is normal. The right ventricular size is normal. Tricuspid regurgitation signal is inadequate for assessing PA pressure.  3. The mitral valve is normal in structure. Trivial mitral valve regurgitation. No evidence of mitral stenosis.  4. The aortic valve is tricuspid. Aortic valve regurgitation is not visualized. No aortic stenosis is present.  5. The inferior vena cava is normal in size with greater than 50% respiratory variability, suggesting right atrial pressure of 3 mmHg.    Neuro/Psych negative neurological ROS     GI/Hepatic Neg liver ROS, GERD  ,  Endo/Other  diabetesHypothyroidism   Renal/GU Renal disease     Musculoskeletal  (+) Arthritis ,   Abdominal (+) + obese,   Peds  Hematology  (+) Blood dyscrasia, anemia ,   Anesthesia Other Findings   Reproductive/Obstetrics                           Anesthesia Physical Anesthesia  Plan  ASA: III  Anesthesia Plan: MAC   Post-op Pain Management:    Induction: Intravenous  PONV Risk Score and Plan: Ondansetron, Propofol infusion and Treatment may vary due to age or medical condition  Airway Management Planned:   Additional Equipment: None  Intra-op Plan:   Post-operative Plan:   Informed Consent: I have reviewed the patients History and Physical, chart, labs and discussed the procedure including the risks, benefits and alternatives for the proposed anesthesia with the patient or authorized representative who has indicated his/her understanding and acceptance.     Dental advisory given  Plan Discussed with: CRNA  Anesthesia Plan Comments:        Anesthesia Quick Evaluation

## 2020-10-13 NOTE — Plan of Care (Signed)
  Problem: Clinical Measurements: Goal: Ability to maintain clinical measurements within normal limits will improve Outcome: Progressing   Problem: Clinical Measurements: Goal: Will remain free from infection Outcome: Progressing   

## 2020-10-13 NOTE — Progress Notes (Addendum)
Progress Note  Patient Name: Ryan Garcia Date of Encounter: 10/13/2020  [facility] Cardiologist: Kirk Ruths, MD   Subjective   No acute overnight events. Remains in atrial flutter with rates in the 130's. However, patient completely asymptomatic with this. No chest pain, shortness of breath, palpitations, lightheadedness, or dizziness. He states if he was going off on how he feels, he would go home because he is asymptomatic.   Plan is for TEE/DCCV tomorrow (no room on schedule today).  Inpatient Medications    Scheduled Meds: . apixaban  5 mg Oral BID  . aspirin EC  81 mg Oral Daily  . chlorhexidine  15 mL Mouth Rinse BID  . glipiZIDE  10 mg Oral BID AC  . insulin aspart  0-15 Units Subcutaneous TID WC  . insulin aspart  0-5 Units Subcutaneous QHS  . insulin glargine  44 Units Subcutaneous QHS  . isosorbide mononitrate  60 mg Oral Daily  . loratadine  10 mg Oral Daily  . mouth rinse  15 mL Mouth Rinse q12n4p  . metoprolol tartrate  25 mg Oral Q6H  . rosuvastatin  40 mg Oral QHS  . vitamin B-12  1,000 mcg Oral Daily   Continuous Infusions:  PRN Meds: acetaminophen, acetaminophen, fluticasone, metoprolol tartrate, nitroGLYCERIN, ondansetron (ZOFRAN) IV   Vital Signs    Vitals:   10/12/20 2119 10/13/20 0058 10/13/20 0507 10/13/20 0609  BP: (!) 131/97 (!) 126/92 118/77   Pulse: (!) 133  (!) 135   Resp: 15     Temp: 98.5 F (36.9 C)  98.4 F (36.9 C)   TempSrc: Oral  Axillary   SpO2: 97%  98%   Weight:    110.9 kg  Height:        Intake/Output Summary (Last 24 hours) at 10/13/2020 0718 Last data filed at 10/13/2020 0058 Gross per 24 hour  Intake 565.07 ml  Output 1550 ml  Net -984.93 ml   Last 3 Weights 10/13/2020 10/12/2020 10/11/2020  Weight (lbs) 244 lb 8 oz 246 lb 3.2 oz 240 lb  Weight (kg) 110.904 kg 111.676 kg 108.863 kg      Telemetry    Atrial flutter with rates in the 130's. Occasional PVCs. - Personally Reviewed  ECG    No new ECG  tracing today. - Personally Reviewed  Physical Exam   GEN: No acute distress.   Neck: No JVD. Cardiac: Tachycardic with irregular rhythm. No murmurs, rubs, or gallops.  Respiratory: Clear to auscultation bilaterally. No wheezes, rhonchi, or rales. GI: Soft, non-distended, and non-tender. MS: Trace lower extremity edema. No deformity. Skin: Warm and dry. Neuro:  No focal deficits. Psych: Normal affect. Responds appropriately.  Labs    High Sensitivity Troponin:   Recent Labs  Lab 10/11/20 1737 10/11/20 2042  TROPONINIHS 8 14      Chemistry Recent Labs  Lab 10/11/20 1737 10/12/20 0720 10/13/20 0257  NA 140 139 140  K 4.5 4.1 3.9  CL 115* 108 109  CO2 21* 23 21*  GLUCOSE 175* 154* 137*  BUN 20 16 19   CREATININE 1.01 1.02 1.06  CALCIUM 7.4* 9.0 8.9  PROT 4.9*  --   --   ALBUMIN 2.8*  --   --   AST 30  --   --   ALT 14  --   --   ALKPHOS 54  --   --   BILITOT 0.7  --   --   GFRNONAA >60 >60 >60  ANIONGAP 4*  8 10     Hematology Recent Labs  Lab 10/11/20 1737 10/12/20 0927 10/13/20 0257  WBC 6.5 6.9 8.0  RBC 3.81* 4.77 4.64  HGB 11.8* 14.5 14.4  HCT 35.9* 44.8 43.1  MCV 94.2 93.9 92.9  MCH 31.0 30.4 31.0  MCHC 32.9 32.4 33.4  RDW 15.8* 15.7* 15.6*  PLT 156 144* 158    BNPNo results for input(s): BNP, PROBNP in the last 168 hours.   DDimer No results for input(s): DDIMER in the last 168 hours.   Radiology    CT ANGIO CHEST PE W OR WO CONTRAST  Result Date: 10/12/2020 CLINICAL DATA:  Suspected pulmonary embolism. EXAM: CT ANGIOGRAPHY CHEST WITH CONTRAST TECHNIQUE: Multidetector CT imaging of the chest was performed using the standard protocol during bolus administration of intravenous contrast. Multiplanar CT image reconstructions and MIPs were obtained to evaluate the vascular anatomy. CONTRAST:  29mL OMNIPAQUE IOHEXOL 350 MG/ML SOLN COMPARISON:  April 14, 2020 FINDINGS: Cardiovascular: There is mild calcification of the aortic arch. Satisfactory  opacification of the pulmonary arteries to the segmental level. An area of artifact is suspected overlying a lower lobe branch of the right pulmonary artery (axial CT image 81, CT series number 7). No true intraluminal filling defects are clearly identified. Normal heart size with moderate to marked severity coronary artery calcification. No pericardial effusion. Mediastinum/Nodes: No enlarged mediastinal, hilar, or axillary lymph nodes. Thyroid gland, trachea, and esophagus demonstrate no significant findings. Lungs/Pleura: Mild atelectasis is seen within the bilateral lung bases. A stable partially calcified lung nodule is seen within the posterolateral aspect of the right lower lobe. There is no evidence of a pleural effusion or pneumothorax. Upper Abdomen: There is a small hiatal hernia. Musculoskeletal: Multilevel degenerative changes are seen throughout the thoracic spine. Review of the MIP images confirms the above findings. IMPRESSION: 1. Area of artifact suspected overlying a lower lobe branch of the right pulmonary artery, without definite evidence of pulmonary embolism. Correlation with follow-up chest CTA or nuclear medicine ventilation/perfusion scan is recommended if pulmonary embolism remains of clinical concern. 2. Evidence of prior granulomatous disease. 3. Small hiatal hernia. 4. Aortic atherosclerosis. Aortic Atherosclerosis (ICD10-I70.0). Electronically Signed   By: Virgina Norfolk M.D.   On: 10/12/2020 01:52   DG Chest Port 1 View  Result Date: 10/11/2020 CLINICAL DATA:  Chest pain EXAM: PORTABLE CHEST 1 VIEW COMPARISON:  04/14/2020 FINDINGS: Borderline cardiomegaly. No focal airspace disease or effusion. Aortic atherosclerosis. No pneumothorax. IMPRESSION: No active disease. Borderline cardiomegaly. Electronically Signed   By: Donavan Foil M.D.   On: 10/11/2020 18:21   ECHOCARDIOGRAM COMPLETE  Result Date: 10/12/2020    ECHOCARDIOGRAM REPORT   Patient Name:   Ryan Garcia Date of  Exam: 10/12/2020 Medical Rec #:  [id number]     Height:       71.5 in Accession #:    [id number]    Weight:       246.2 lb Date of Birth:  07-Nov-1933     BSA:          2.315 m Patient Age:    87 years      BP:           146/105 mmHg Patient Gender: M             HR:           129 bpm. Exam Location:  Inpatient Procedure: 2D Echo, Color Doppler and Cardiac Doppler Indications:    Atrial Fibrillation I48.91  History:  Patient has prior history of Echocardiogram examinations, most                 recent 04/12/2020. CAD and Previous Myocardial Infarction; Risk                 Factors:Dyslipidemia, Diabetes, Hypertension and Sleep Apnea.  Sonographer:    Darlina Sicilian RDCS Referring Phys: [place]  1. Left ventricular ejection fraction, by estimation, is 55 to 60%. The left ventricle has normal function. The left ventricle demonstrates regional wall motion abnormalities. Mild basal inferior hypokinesis based on limited views consistent with prior TTE. There is severe concentric left ventricular hypertrophy. LV diastolic function is indeterminant due to atrial fibrilltion.  2. Right ventricular systolic function is normal. The right ventricular size is normal. Tricuspid regurgitation signal is inadequate for assessing PA pressure.  3. The mitral valve is normal in structure. Trivial mitral valve regurgitation. No evidence of mitral stenosis.  4. The aortic valve is tricuspid. Aortic valve regurgitation is not visualized. No aortic stenosis is present.  5. The inferior vena cava is normal in size with greater than 50% respiratory variability, suggesting right atrial pressure of 3 mmHg. Comparison(s): Compared to prior TTE on 04/12/2020, the patient is now in Afib with RVR. Otherwise, no significant change. FINDINGS  Left Ventricle: Left ventricular ejection fraction, by estimation, is 55 to 60%. The left ventricle has normal function. The left ventricle demonstrates regional wall motion  abnormalities. Mild basal inferior hypokinesis based on limited views consistent with prior TTE. The left ventricular internal cavity size was normal in size. There is severe concentric left ventricular hypertrophy. LV diastolic function is indeterminant due to atrial fibrilltion. Right Ventricle: The right ventricular size is normal. Right vetricular wall thickness was not well visualized. Right ventricular systolic function is normal. Tricuspid regurgitation signal is inadequate for assessing PA pressure. Left Atrium: Left atrial size was normal in size. Right Atrium: Right atrial size was normal in size. Pericardium: There is no evidence of pericardial effusion. Mitral Valve: The mitral valve is normal in structure. Trivial mitral valve regurgitation. No evidence of mitral valve stenosis. Tricuspid Valve: The tricuspid valve is normal in structure. Tricuspid valve regurgitation is trivial. Aortic Valve: The aortic valve is tricuspid. Aortic valve regurgitation is not visualized. No aortic stenosis is present. Pulmonic Valve: The pulmonic valve was not well visualized. Pulmonic valve regurgitation is trivial. Aorta: The aortic root and ascending aorta are structurally normal, with no evidence of dilitation. Venous: The inferior vena cava is normal in size with greater than 50% respiratory variability, suggesting right atrial pressure of 3 mmHg. IAS/Shunts: No atrial level shunt detected by color flow Doppler.  LEFT VENTRICLE PLAX 2D LVIDd:         4.20 cm LVIDs:         3.20 cm LV PW:         1.30 cm LV IVS:        1.60 cm LVOT diam:     2.00 cm LV SV:         44 LV SV Index:   19 LVOT Area:     3.14 cm  RIGHT VENTRICLE RV S prime:     14.90 cm/s TAPSE (M-mode): 1.8 cm LEFT ATRIUM             Index       RIGHT ATRIUM           Index LA diam:  3.40 cm 1.47 cm/m  RA Area:     18.40 cm LA Vol (A2C):   43.2 ml 18.66 ml/m RA Volume:   42.50 ml  18.36 ml/m LA Vol (A4C):   36.0 ml 15.55 ml/m LA Biplane Vol:  40.9 ml 17.67 ml/m  AORTIC VALVE LVOT Vmax:   85.40 cm/s LVOT Vmean:  55.200 cm/s LVOT VTI:    0.139 m  AORTA Ao Root diam: 3.60 cm MITRAL VALVE MV Area (PHT): 7.00 cm     SHUNTS MV Decel Time: 108 msec     Systemic VTI:  0.14 m MV E velocity: 106.67 cm/s  Systemic Diam: 2.00 cm Gwyndolyn Kaufman MD Electronically signed by Gwyndolyn Kaufman MD Signature Date/Time: 10/12/2020/11:47:36 AM    Final     Cardiac Studies   Left Cardiac Catheterization 04/12/2020:  Prox LAD lesion is 85% stenosed.  Ost 1st Diag to 1st Diag lesion is 85% stenosed.  Dist LAD lesion is 65% stenosed.  Prox LAD to Mid LAD lesion is 75% stenosed.  Ost Cx to Mid Cx lesion is 80% stenosed.  Ost 1st Mrg to 1st Mrg lesion is 95% stenosed.  Mid Cx to Dist Cx lesion is 65% stenosed.  2nd Mrg lesion is 75% stenosed.  RPDA lesion is 60% stenosed.  1st RPL lesion is 70% stenosed.  Prox RCA to Mid RCA lesion is 30% stenosed.  Previously placed Dist RCA drug eluting stent is widely patent.  Balloon angioplasty was performed.  LV end diastolic pressure is normal.   1. Severe 2 vessel obstructive CAD 2. Patent stent in the RCA 3. Normal LVEDP  Plan: The severe disease in the left coronary distribution is unchanged from prior. No suitable targets for PCI. Recommend continued medical therapy. If refractory angina would need to consider CABG.  Diagnostic Dominance: Right     _______________  Echocardiogram 10/12/2020: Impressions: 1. Left ventricular ejection fraction, by estimation, is 55 to 60%. The  left ventricle has normal function. The left ventricle demonstrates  regional wall motion abnormalities. Mild basal inferior hypokinesis based  on limited views consistent with prior  TTE. There is severe concentric left ventricular hypertrophy. LV diastolic  function is indeterminant due to atrial fibrilltion.  2. Right ventricular systolic function is normal. The right ventricular  size is normal.  Tricuspid regurgitation signal is inadequate for assessing  PA pressure.  3. The mitral valve is normal in structure. Trivial mitral valve  regurgitation. No evidence of mitral stenosis.  4. The aortic valve is tricuspid. Aortic valve regurgitation is not  visualized. No aortic stenosis is present.  5. The inferior vena cava is normal in size with greater than 50%  respiratory variability, suggesting right atrial pressure of 3 mmHg.   Comparison(s): Compared to prior TTE on 04/12/2020, the patient is now in  Afib with RVR. Otherwise, no significant change.  Patient Profile     87 y.o. male with a history of diffuse three vessel CAD s/p DES to distal RCA in 06/2017 and on medical therapy due to declining CABG, hypertension, hyperlipidemia, type 2 diabetes, obstructive sleep apnea on CPAP, GERD, BPH, and obesity who is being seen for evaluation of chest pain and atrial flutter with RVR.  Assessment & Plan    New Onset Atrial Flutterwith RVR - Patient presented with chest pain and palpitations and was found to be in atrial flutter with RVR.  - Still in atrial flutter with rates staying in the 130's. - Electrolytes within normal limits. - TSH elevated  at 8.47 but free T4 normal.  - Echo showed LVEF of 55-60% with mild basal inferior hypokinesis (unchanged from prior study). - Atrial flutter is often very difficult to rate control but will increase Lopressor to 50mg  every 6 hours.  - CHA2DS2-VASC = 5 (CAD, HTN, DM, age x2). Started on Eliquis 5mg  twice daily. - Plan is for TEE/DCCV tomorrow (no room on board today). Consented for procedure yesterday.  Chest Pain History of CAD - History of diffuse 3 vessel CAD s/p DES to distal RCA. Last cath in 04/2020 showed stable disease. Patient has declined CABG in the past and is being treated medically. - High-sensitivity troponin negative x2. - Echo as above. - Chest pain has resolved. Felt to be secondary to atrial flutter. - Continue  aspirin and high-intensity statin.  - Continue beta-blocker as above. - Continue Imdur 60mg  daily.  Hypertension - BP elevated at times. - Will increase Lopressor as above for additional rate control. - Continue Imdur as above.  Hyperlipidemia - Lipid panel this admission: Total Cholesterol 113, Triglycerides 73, HDL 41, LDL 57. - LDL goal <70 given CAD. - Continue Crestor 40mg  daily.  Type 2 Diabetes - Management per primary team.  Obstructive Sleep Apnea - Compliant with CPAP.  Elevated TSH - TSH elevated at 8.47 this admission. Free T4 normal. - Management per primary team.  For questions or updates, please contact [place] Please consult [email or web address] for contact info under        Signed, Darreld Mclean, PA-C  10/13/2020, 7:18 AM    Patient seen and examined.  Agree with above documentation.  On exam, patient is alert and oriented, tachycardic, regular rhythm, no murmurs, lungs CTAB, 1+ lower extremity edema.  Telemetry shows atrial flutter with rate 130.  Plan for TEE/DCCV tomorrow.  Donato Heinz, MD

## 2020-10-13 NOTE — Progress Notes (Signed)
Patient requested AD information. Chaplain carried it to patient's room and visited with him for a bit. Patient seemed to be confused about the document and was very sleepy. I asked him to share the information with his wife when she arrived and if they have questions to please have nurse contact Chaplain.   10/13/20 1100  Clinical Encounter Type  Visited With Patient  Visit Type Other (Comment)  Referral From Nurse  Consult/Referral To Chaplain

## 2020-10-13 NOTE — H&P (View-Only) (Signed)
Progress Note  Patient Name: Ryan Garcia Date of Encounter: 10/13/2020  [facility] Cardiologist: Kirk Ruths, MD   Subjective   No acute overnight events. Remains in atrial flutter with rates in the 130's. However, patient completely asymptomatic with this. No chest pain, shortness of breath, palpitations, lightheadedness, or dizziness. He states if he was going off on how he feels, he would go home because he is asymptomatic.   Plan is for TEE/DCCV tomorrow (no room on schedule today).  Inpatient Medications    Scheduled Meds: . apixaban  5 mg Oral BID  . aspirin EC  81 mg Oral Daily  . chlorhexidine  15 mL Mouth Rinse BID  . glipiZIDE  10 mg Oral BID AC  . insulin aspart  0-15 Units Subcutaneous TID WC  . insulin aspart  0-5 Units Subcutaneous QHS  . insulin glargine  44 Units Subcutaneous QHS  . isosorbide mononitrate  60 mg Oral Daily  . loratadine  10 mg Oral Daily  . mouth rinse  15 mL Mouth Rinse q12n4p  . metoprolol tartrate  25 mg Oral Q6H  . rosuvastatin  40 mg Oral QHS  . vitamin B-12  1,000 mcg Oral Daily   Continuous Infusions:  PRN Meds: acetaminophen, acetaminophen, fluticasone, metoprolol tartrate, nitroGLYCERIN, ondansetron (ZOFRAN) IV   Vital Signs    Vitals:   10/12/20 2119 10/13/20 0058 10/13/20 0507 10/13/20 0609  BP: (!) 131/97 (!) 126/92 118/77   Pulse: (!) 133  (!) 135   Resp: 15     Temp: 98.5 F (36.9 C)  98.4 F (36.9 C)   TempSrc: Oral  Axillary   SpO2: 97%  98%   Weight:    110.9 kg  Height:        Intake/Output Summary (Last 24 hours) at 10/13/2020 0718 Last data filed at 10/13/2020 0058 Gross per 24 hour  Intake 565.07 ml  Output 1550 ml  Net -984.93 ml   Last 3 Weights 10/13/2020 10/12/2020 10/11/2020  Weight (lbs) 244 lb 8 oz 246 lb 3.2 oz 240 lb  Weight (kg) 110.904 kg 111.676 kg 108.863 kg      Telemetry    Atrial flutter with rates in the 130's. Occasional PVCs. - Personally Reviewed  ECG    No new ECG  tracing today. - Personally Reviewed  Physical Exam   GEN: No acute distress.   Neck: No JVD. Cardiac: Tachycardic with irregular rhythm. No murmurs, rubs, or gallops.  Respiratory: Clear to auscultation bilaterally. No wheezes, rhonchi, or rales. GI: Soft, non-distended, and non-tender. MS: Trace lower extremity edema. No deformity. Skin: Warm and dry. Neuro:  No focal deficits. Psych: Normal affect. Responds appropriately.  Labs    High Sensitivity Troponin:   Recent Labs  Lab 10/11/20 1737 10/11/20 2042  TROPONINIHS 8 14      Chemistry Recent Labs  Lab 10/11/20 1737 10/12/20 0720 10/13/20 0257  NA 140 139 140  K 4.5 4.1 3.9  CL 115* 108 109  CO2 21* 23 21*  GLUCOSE 175* 154* 137*  BUN 20 16 19   CREATININE 1.01 1.02 1.06  CALCIUM 7.4* 9.0 8.9  PROT 4.9*  --   --   ALBUMIN 2.8*  --   --   AST 30  --   --   ALT 14  --   --   ALKPHOS 54  --   --   BILITOT 0.7  --   --   GFRNONAA >60 >60 >60  ANIONGAP 4*  8 10     Hematology Recent Labs  Lab 10/11/20 1737 10/12/20 0927 10/13/20 0257  WBC 6.5 6.9 8.0  RBC 3.81* 4.77 4.64  HGB 11.8* 14.5 14.4  HCT 35.9* 44.8 43.1  MCV 94.2 93.9 92.9  MCH 31.0 30.4 31.0  MCHC 32.9 32.4 33.4  RDW 15.8* 15.7* 15.6*  PLT 156 144* 158    BNPNo results for input(s): BNP, PROBNP in the last 168 hours.   DDimer No results for input(s): DDIMER in the last 168 hours.   Radiology    CT ANGIO CHEST PE W OR WO CONTRAST  Result Date: 10/12/2020 CLINICAL DATA:  Suspected pulmonary embolism. EXAM: CT ANGIOGRAPHY CHEST WITH CONTRAST TECHNIQUE: Multidetector CT imaging of the chest was performed using the standard protocol during bolus administration of intravenous contrast. Multiplanar CT image reconstructions and MIPs were obtained to evaluate the vascular anatomy. CONTRAST:  92mL OMNIPAQUE IOHEXOL 350 MG/ML SOLN COMPARISON:  April 14, 2020 FINDINGS: Cardiovascular: There is mild calcification of the aortic arch. Satisfactory  opacification of the pulmonary arteries to the segmental level. An area of artifact is suspected overlying a lower lobe branch of the right pulmonary artery (axial CT image 81, CT series number 7). No true intraluminal filling defects are clearly identified. Normal heart size with moderate to marked severity coronary artery calcification. No pericardial effusion. Mediastinum/Nodes: No enlarged mediastinal, hilar, or axillary lymph nodes. Thyroid gland, trachea, and esophagus demonstrate no significant findings. Lungs/Pleura: Mild atelectasis is seen within the bilateral lung bases. A stable partially calcified lung nodule is seen within the posterolateral aspect of the right lower lobe. There is no evidence of a pleural effusion or pneumothorax. Upper Abdomen: There is a small hiatal hernia. Musculoskeletal: Multilevel degenerative changes are seen throughout the thoracic spine. Review of the MIP images confirms the above findings. IMPRESSION: 1. Area of artifact suspected overlying a lower lobe branch of the right pulmonary artery, without definite evidence of pulmonary embolism. Correlation with follow-up chest CTA or nuclear medicine ventilation/perfusion scan is recommended if pulmonary embolism remains of clinical concern. 2. Evidence of prior granulomatous disease. 3. Small hiatal hernia. 4. Aortic atherosclerosis. Aortic Atherosclerosis (ICD10-I70.0). Electronically Signed   By: Virgina Norfolk M.D.   On: 10/12/2020 01:52   DG Chest Port 1 View  Result Date: 10/11/2020 CLINICAL DATA:  Chest pain EXAM: PORTABLE CHEST 1 VIEW COMPARISON:  04/14/2020 FINDINGS: Borderline cardiomegaly. No focal airspace disease or effusion. Aortic atherosclerosis. No pneumothorax. IMPRESSION: No active disease. Borderline cardiomegaly. Electronically Signed   By: Donavan Foil M.D.   On: 10/11/2020 18:21   ECHOCARDIOGRAM COMPLETE  Result Date: 10/12/2020    ECHOCARDIOGRAM REPORT   Patient Name:   Ryan Garcia Date of  Exam: 10/12/2020 Medical Rec #:  [id number]     Height:       71.5 in Accession #:    [id number]    Weight:       246.2 lb Date of Birth:  1934/02/16     BSA:          2.315 m Patient Age:    87 years      BP:           146/105 mmHg Patient Gender: M             HR:           129 bpm. Exam Location:  Inpatient Procedure: 2D Echo, Color Doppler and Cardiac Doppler Indications:    Atrial Fibrillation I48.91  History:  Patient has prior history of Echocardiogram examinations, most                 recent 04/12/2020. CAD and Previous Myocardial Infarction; Risk                 Factors:Dyslipidemia, Diabetes, Hypertension and Sleep Apnea.  Sonographer:    Darlina Sicilian RDCS Referring Phys: [place]  1. Left ventricular ejection fraction, by estimation, is 55 to 60%. The left ventricle has normal function. The left ventricle demonstrates regional wall motion abnormalities. Mild basal inferior hypokinesis based on limited views consistent with prior TTE. There is severe concentric left ventricular hypertrophy. LV diastolic function is indeterminant due to atrial fibrilltion.  2. Right ventricular systolic function is normal. The right ventricular size is normal. Tricuspid regurgitation signal is inadequate for assessing PA pressure.  3. The mitral valve is normal in structure. Trivial mitral valve regurgitation. No evidence of mitral stenosis.  4. The aortic valve is tricuspid. Aortic valve regurgitation is not visualized. No aortic stenosis is present.  5. The inferior vena cava is normal in size with greater than 50% respiratory variability, suggesting right atrial pressure of 3 mmHg. Comparison(s): Compared to prior TTE on 04/12/2020, the patient is now in Afib with RVR. Otherwise, no significant change. FINDINGS  Left Ventricle: Left ventricular ejection fraction, by estimation, is 55 to 60%. The left ventricle has normal function. The left ventricle demonstrates regional wall motion  abnormalities. Mild basal inferior hypokinesis based on limited views consistent with prior TTE. The left ventricular internal cavity size was normal in size. There is severe concentric left ventricular hypertrophy. LV diastolic function is indeterminant due to atrial fibrilltion. Right Ventricle: The right ventricular size is normal. Right vetricular wall thickness was not well visualized. Right ventricular systolic function is normal. Tricuspid regurgitation signal is inadequate for assessing PA pressure. Left Atrium: Left atrial size was normal in size. Right Atrium: Right atrial size was normal in size. Pericardium: There is no evidence of pericardial effusion. Mitral Valve: The mitral valve is normal in structure. Trivial mitral valve regurgitation. No evidence of mitral valve stenosis. Tricuspid Valve: The tricuspid valve is normal in structure. Tricuspid valve regurgitation is trivial. Aortic Valve: The aortic valve is tricuspid. Aortic valve regurgitation is not visualized. No aortic stenosis is present. Pulmonic Valve: The pulmonic valve was not well visualized. Pulmonic valve regurgitation is trivial. Aorta: The aortic root and ascending aorta are structurally normal, with no evidence of dilitation. Venous: The inferior vena cava is normal in size with greater than 50% respiratory variability, suggesting right atrial pressure of 3 mmHg. IAS/Shunts: No atrial level shunt detected by color flow Doppler.  LEFT VENTRICLE PLAX 2D LVIDd:         4.20 cm LVIDs:         3.20 cm LV PW:         1.30 cm LV IVS:        1.60 cm LVOT diam:     2.00 cm LV SV:         44 LV SV Index:   19 LVOT Area:     3.14 cm  RIGHT VENTRICLE RV S prime:     14.90 cm/s TAPSE (M-mode): 1.8 cm LEFT ATRIUM             Index       RIGHT ATRIUM           Index LA diam:  3.40 cm 1.47 cm/m  RA Area:     18.40 cm LA Vol (A2C):   43.2 ml 18.66 ml/m RA Volume:   42.50 ml  18.36 ml/m LA Vol (A4C):   36.0 ml 15.55 ml/m LA Biplane Vol:  40.9 ml 17.67 ml/m  AORTIC VALVE LVOT Vmax:   85.40 cm/s LVOT Vmean:  55.200 cm/s LVOT VTI:    0.139 m  AORTA Ao Root diam: 3.60 cm MITRAL VALVE MV Area (PHT): 7.00 cm     SHUNTS MV Decel Time: 108 msec     Systemic VTI:  0.14 m MV E velocity: 106.67 cm/s  Systemic Diam: 2.00 cm Gwyndolyn Kaufman MD Electronically signed by Gwyndolyn Kaufman MD Signature Date/Time: 10/12/2020/11:47:36 AM    Final     Cardiac Studies   Left Cardiac Catheterization 04/12/2020:  Prox LAD lesion is 85% stenosed.  Ost 1st Diag to 1st Diag lesion is 85% stenosed.  Dist LAD lesion is 65% stenosed.  Prox LAD to Mid LAD lesion is 75% stenosed.  Ost Cx to Mid Cx lesion is 80% stenosed.  Ost 1st Mrg to 1st Mrg lesion is 95% stenosed.  Mid Cx to Dist Cx lesion is 65% stenosed.  2nd Mrg lesion is 75% stenosed.  RPDA lesion is 60% stenosed.  1st RPL lesion is 70% stenosed.  Prox RCA to Mid RCA lesion is 30% stenosed.  Previously placed Dist RCA drug eluting stent is widely patent.  Balloon angioplasty was performed.  LV end diastolic pressure is normal.   1. Severe 2 vessel obstructive CAD 2. Patent stent in the RCA 3. Normal LVEDP  Plan: The severe disease in the left coronary distribution is unchanged from prior. No suitable targets for PCI. Recommend continued medical therapy. If refractory angina would need to consider CABG.  Diagnostic Dominance: Right     _______________  Echocardiogram 10/12/2020: Impressions: 1. Left ventricular ejection fraction, by estimation, is 55 to 60%. The  left ventricle has normal function. The left ventricle demonstrates  regional wall motion abnormalities. Mild basal inferior hypokinesis based  on limited views consistent with prior  TTE. There is severe concentric left ventricular hypertrophy. LV diastolic  function is indeterminant due to atrial fibrilltion.  2. Right ventricular systolic function is normal. The right ventricular  size is normal.  Tricuspid regurgitation signal is inadequate for assessing  PA pressure.  3. The mitral valve is normal in structure. Trivial mitral valve  regurgitation. No evidence of mitral stenosis.  4. The aortic valve is tricuspid. Aortic valve regurgitation is not  visualized. No aortic stenosis is present.  5. The inferior vena cava is normal in size with greater than 50%  respiratory variability, suggesting right atrial pressure of 3 mmHg.   Comparison(s): Compared to prior TTE on 04/12/2020, the patient is now in  Afib with RVR. Otherwise, no significant change.  Patient Profile     87 y.o. male with a history of diffuse three vessel CAD s/p DES to distal RCA in 06/2017 and on medical therapy due to declining CABG, hypertension, hyperlipidemia, type 2 diabetes, obstructive sleep apnea on CPAP, GERD, BPH, and obesity who is being seen for evaluation of chest pain and atrial flutter with RVR.  Assessment & Plan    New Onset Atrial Flutterwith RVR - Patient presented with chest pain and palpitations and was found to be in atrial flutter with RVR.  - Still in atrial flutter with rates staying in the 130's. - Electrolytes within normal limits. - TSH elevated  at 8.47 but free T4 normal.  - Echo showed LVEF of 55-60% with mild basal inferior hypokinesis (unchanged from prior study). - Atrial flutter is often very difficult to rate control but will increase Lopressor to 50mg  every 6 hours.  - CHA2DS2-VASC = 5 (CAD, HTN, DM, age x2). Started on Eliquis 5mg  twice daily. - Plan is for TEE/DCCV tomorrow (no room on board today). Consented for procedure yesterday.  Chest Pain History of CAD - History of diffuse 3 vessel CAD s/p DES to distal RCA. Last cath in 04/2020 showed stable disease. Patient has declined CABG in the past and is being treated medically. - High-sensitivity troponin negative x2. - Echo as above. - Chest pain has resolved. Felt to be secondary to atrial flutter. - Continue  aspirin and high-intensity statin.  - Continue beta-blocker as above. - Continue Imdur 60mg  daily.  Hypertension - BP elevated at times. - Will increase Lopressor as above for additional rate control. - Continue Imdur as above.  Hyperlipidemia - Lipid panel this admission: Total Cholesterol 113, Triglycerides 73, HDL 41, LDL 57. - LDL goal <70 given CAD. - Continue Crestor 40mg  daily.  Type 2 Diabetes - Management per primary team.  Obstructive Sleep Apnea - Compliant with CPAP.  Elevated TSH - TSH elevated at 8.47 this admission. Free T4 normal. - Management per primary team.  For questions or updates, please contact [place] Please consult [email or web address] for contact info under        Signed, Darreld Mclean, PA-C  10/13/2020, 7:18 AM    Patient seen and examined.  Agree with above documentation.  On exam, patient is alert and oriented, tachycardic, regular rhythm, no murmurs, lungs CTAB, 1+ lower extremity edema.  Telemetry shows atrial flutter with rate 130.  Plan for TEE/DCCV tomorrow.  Donato Heinz, MD

## 2020-10-13 NOTE — Progress Notes (Signed)
PROGRESS NOTE    Ryan Garcia  NIO:[id number] DOB: January 20, 1934 DOA: 10/11/2020 PCP: Abner Greenspan, MD   Chief Complaint  Patient presents with  . Chest Pain  Brief Narrative: 87 year old male with history of CAD status post previous stent, hard of hearing, diabetes hypertension, BPH, OSA, morbid obesity, osteoarthritis brought in by wife due to sudden onset of left-sided chest pain that is started in the afternoon of 4/12, pressure-like sharp in sensation also with palpitation. In the ED initial troponin was negative, vitals are stable but was tachycardic and found to be A. fib with RVR.  Chest x-ray no acute finding.  Cardiology was curb sided advised to increase beta-blocker.  Patient was placed on heparin patient and admitted.  Subjective: Seen and examined this morning. Remains tachycardic in 130s this am-but asymptomatic denies palpitation or chest pain. Slept well with his home CPAP machine last night.  Assessment & Plan:  New onset a flutter with RVR/A. Fib: Heart rate remains poorly controlled despite on metoprolol 25 every 6 hours now on Eliquis for anticoagulation , TSH elevated 8.4 but free T4 normal.  Cardiology input appreciated plan for TEE/DCCV 4/15 due to a flutter which is difficult rate control.  Echo reviewed EF 55 to 60% left ventricular regional wall motion normalities severe concentric LVH  Chest Pain likely from #1, troponins negative.  CTA was done that did not show any acute finding, area of artifact suspected overlying the lower lobe branch of the right pulmonary artery without definite evidence of pulmonary embolism.  Continue Imdur Crestor and aspirin.  CAD S/P percutaneous coronary angioplasty: Previously declined CABG for multivessel disease.  Continue his Imdur Crestor aspirin.  Hyperlipidemia associated with type 2 diabetes mellitus:LDL is stable 41 HDL low 31.continue his Crestor  GERD: Stable  Uncontrolled type 2 diabetes mellitus with hyperglycemia:  Sugar well controlled on sliding scale insulin, glipizide and Lantus 44 units-change to 25 units at bedtime for n.p.o. past midnight. HBA1c uncontrolled at 8.0. Recent Labs  Lab 10/12/20 1129 10/12/20 1605 10/12/20 2114 10/13/20 0820 10/13/20 1129  GLUCAP 146* 168* 175* 175* 232*   Morbid obesity BMI 33:Will benefit with weight loss and PCP follow-up.  Diet Order            Diet heart healthy/carb modified Room service appropriate? Yes; Fluid consistency: Thin  Diet effective now               Patient's Body mass index is 31.38 kg/m. DVT prophylaxis: HEPARIN GTT Code Status:   Code Status: Full Code  Family Communication: plan of care discussed with patient at bedside. I spoke to patient's wife on the phone  4/13  Status is: Inpatient Remains inpatient appropriate because:Inpatient level of care appropriate due to severity of illness Dispo: The patient is from: Home              Anticipated d/c is to: Home.  PT OT evaluation once heart rate improves              Patient currently is not medically stable to d/c.   Difficult to place patient No  Unresulted Labs (From admission, onward)          Start     Ordered   10/13/20 8182  Basic metabolic panel  Daily,   R     Question:  Specimen collection method  Answer:  Lab=Lab collect   10/12/20 1351   Signed and Held  Protime-INR  Once,   STAT  Question:  Specimen collection method  Answer:  Lab=Lab collect   Signed and Held          Medications reviewed:  Scheduled Meds: . apixaban  5 mg Oral BID  . aspirin EC  81 mg Oral Daily  . chlorhexidine  15 mL Mouth Rinse BID  . glipiZIDE  10 mg Oral BID AC  . insulin aspart  0-15 Units Subcutaneous TID WC  . insulin aspart  0-5 Units Subcutaneous QHS  . insulin glargine  44 Units Subcutaneous QHS  . isosorbide mononitrate  60 mg Oral Daily  . loratadine  10 mg Oral Daily  . mouth rinse  15 mL Mouth Rinse q12n4p  . metoprolol tartrate  50 mg Oral Q6H  . rosuvastatin   40 mg Oral QHS  . vitamin B-12  1,000 mcg Oral Daily   Continuous Infusions:   Consultants:see note  Procedures:see note  Antimicrobials: Anti-infectives (From admission, onward)   None     Culture/Microbiology  Other culture-see note  Objective: Vitals: Today's Vitals   10/13/20 0609 10/13/20 0743 10/13/20 1051 10/13/20 1132  BP:  (!) 129/97 120/87 118/88  Pulse:  (!) 133 (!) 134 (!) 136  Resp:  16 16 16   Temp:  97.8 F (36.6 C) 98 F (36.7 C) 98.6 F (37 C)  TempSrc:  Oral Oral Oral  SpO2:  98% 96% 93%  Weight: 110.9 kg 103.5 kg    Height:      PainSc:  0-No pain 0-No pain     Intake/Output Summary (Last 24 hours) at 10/13/2020 1138 Last data filed at 10/13/2020 1127 Gross per 24 hour  Intake 325.07 ml  Output 1860 ml  Net -1534.93 ml   Filed Weights   10/12/20 0245 10/13/20 0609 10/13/20 0743  Weight: 111.7 kg 110.9 kg 103.5 kg   Weight change: 2.041 kg  Intake/Output from previous day: 04/13 0701 - 04/14 0700 In: 565.1 [P.O.:420; I.V.:145.1] Out: 1550 [VOHYW:7371] Intake/Output this shift: Total I/O In: -  Out: [place] Weights   10/12/20 0245 10/13/20 0609 10/13/20 0743  Weight: 111.7 kg 110.9 kg 103.5 kg    Examination: General exam: AAOx3, obese , NAD, weak appearing. HEENT:Oral mucosa moist, Ear/Nose WNL grossly, dentition normal. Respiratory system: bilaterally clear,no wheezing or crackles,no use of accessory muscle Cardiovascular system: S1 & S2 +, tachycardic, no JVD,. Gastrointestinal system: Abdomen soft, NT,ND, BS+ Nervous System:Alert, awake, moving extremities and grossly nonfocal Extremities: No edema, distal peripheral pulses palpable.  Skin: No rashes,no icterus. MSK: Normal muscle bulk,tone, power  Data Reviewed: I have personally reviewed following labs and imaging studies CBC: Recent Labs  Lab 10/11/20 1737 10/12/20 0927 10/13/20 0257  WBC 6.5 6.9 8.0  NEUTROABS 4.2  --   --   HGB 11.8* 14.5 14.4  HCT  35.9* 44.8 43.1  MCV 94.2 93.9 92.9  PLT 156 144* 062   Basic Metabolic Panel: Recent Labs  Lab 10/11/20 1737 10/12/20 0720 10/12/20 0854 10/13/20 0257  NA 140 139  --  140  K 4.5 4.1  --  3.9  CL 115* 108  --  109  CO2 21* 23  --  21*  GLUCOSE 175* 154*  --  137*  BUN 20 16  --  19  CREATININE 1.01 1.02  --  1.06  CALCIUM 7.4* 9.0  --  8.9  MG  --   --  1.8  --    GFR: Estimated Creatinine Clearance: 64.1 mL/min (by C-G formula based on  SCr of 1.06 mg/dL). Liver Function Tests: Recent Labs  Lab 10/11/20 1737  AST 30  ALT 14  ALKPHOS 54  BILITOT 0.7  PROT 4.9*  ALBUMIN 2.8*   No results for input(s): LIPASE, AMYLASE in the last 168 hours. No results for input(s): AMMONIA in the last 168 hours. Coagulation Profile: No results for input(s): INR, PROTIME in the last 168 hours. Cardiac Enzymes: No results for input(s): CKTOTAL, CKMB, CKMBINDEX, TROPONINI in the last 168 hours. BNP (last 3 results) No results for input(s): PROBNP in the last 8760 hours. HbA1C: Recent Labs    10/11/20 2211  HGBA1C 8.0*   CBG: Recent Labs  Lab 10/12/20 1129 10/12/20 1605 10/12/20 2114 10/13/20 0820 10/13/20 1129  GLUCAP 146* 168* 175* 175* 232*   Lipid Profile: Recent Labs    10/12/20 0720  CHOL 113  HDL 41  LDLCALC 57  TRIG 73  CHOLHDL 2.8   Thyroid Function Tests: Recent Labs    10/12/20 0854  TSH 8.470*  FREET4 0.74   Anemia Panel: No results for input(s): VITAMINB12, FOLATE, FERRITIN, TIBC, IRON, RETICCTPCT in the last 72 hours. Sepsis Labs: No results for input(s): PROCALCITON, LATICACIDVEN in the last 168 hours.  Recent Results (from the past 240 hour(s))  SARS CORONAVIRUS 2 (TAT 6-24 HRS) Nasopharyngeal Nasopharyngeal Swab     Status: None   Collection Time: 10/11/20  9:54 PM   Specimen: Nasopharyngeal Swab  Result Value Ref Range Status   SARS Coronavirus 2 NEGATIVE NEGATIVE Final    Comment: (NOTE) SARS-CoV-2 target nucleic acids are NOT  DETECTED.  The SARS-CoV-2 RNA is generally detectable in upper and lower respiratory specimens during the acute phase of infection. Negative results do not preclude SARS-CoV-2 infection, do not rule out co-infections with other pathogens, and should not be used as the sole basis for treatment or other patient management decisions. Negative results must be combined with clinical observations, patient history, and epidemiological information. The expected result is Negative.  Fact Sheet for Patients: SugarRoll.be  Fact Sheet for Healthcare Providers: https://www.woods-mathews.com/  This test is not yet approved or cleared by the [place] FDA and  has been authorized for detection and/or diagnosis of SARS-CoV-2 by FDA under an Emergency Use Authorization (EUA). This EUA will remain  in effect (meaning this test can be used) for the duration of the COVID-19 declaration under Se ction 564(b)(1) of the Act, 21 U.S.C. section 360bbb-3(b)(1), unless the authorization is terminated or revoked sooner.  Performed at [facility], [address] [place], [place] [address]      Radiology Studies: CT ANGIO CHEST PE W OR WO CONTRAST  Result Date: 10/12/2020 CLINICAL DATA:  Suspected pulmonary embolism. EXAM: CT ANGIOGRAPHY CHEST WITH CONTRAST TECHNIQUE: Multidetector CT imaging of the chest was performed using the standard protocol during bolus administration of intravenous contrast. Multiplanar CT image reconstructions and MIPs were obtained to evaluate the vascular anatomy. CONTRAST:  34mL OMNIPAQUE IOHEXOL 350 MG/ML SOLN COMPARISON:  April 14, 2020 FINDINGS: Cardiovascular: There is mild calcification of the aortic arch. Satisfactory opacification of the pulmonary arteries to the segmental level. An area of artifact is suspected overlying a lower lobe branch of the right pulmonary artery (axial CT image 81, CT series number 7). No true  intraluminal filling defects are clearly identified. Normal heart size with moderate to marked severity coronary artery calcification. No pericardial effusion. Mediastinum/Nodes: No enlarged mediastinal, hilar, or axillary lymph nodes. Thyroid gland, trachea, and esophagus demonstrate no significant findings. Lungs/Pleura:  Mild atelectasis is seen within the bilateral lung bases. A stable partially calcified lung nodule is seen within the posterolateral aspect of the right lower lobe. There is no evidence of a pleural effusion or pneumothorax. Upper Abdomen: There is a small hiatal hernia. Musculoskeletal: Multilevel degenerative changes are seen throughout the thoracic spine. Review of the MIP images confirms the above findings. IMPRESSION: 1. Area of artifact suspected overlying a lower lobe branch of the right pulmonary artery, without definite evidence of pulmonary embolism. Correlation with follow-up chest CTA or nuclear medicine ventilation/perfusion scan is recommended if pulmonary embolism remains of clinical concern. 2. Evidence of prior granulomatous disease. 3. Small hiatal hernia. 4. Aortic atherosclerosis. Aortic Atherosclerosis (ICD10-I70.0). Electronically Signed   By: Virgina Norfolk M.D.   On: 10/12/2020 01:52   DG Chest Port 1 View  Result Date: 10/11/2020 CLINICAL DATA:  Chest pain EXAM: PORTABLE CHEST 1 VIEW COMPARISON:  04/14/2020 FINDINGS: Borderline cardiomegaly. No focal airspace disease or effusion. Aortic atherosclerosis. No pneumothorax. IMPRESSION: No active disease. Borderline cardiomegaly. Electronically Signed   By: Donavan Foil M.D.   On: 10/11/2020 18:21   ECHOCARDIOGRAM COMPLETE  Result Date: 10/12/2020    ECHOCARDIOGRAM REPORT   Patient Name:   Ryan Garcia Date of Exam: 10/12/2020 Medical Rec #:  [id number]     Height:       71.5 in Accession #:    [id number]    Weight:       246.2 lb Date of Birth:  1934/05/10     BSA:          2.315 m Patient Age:    36 years      BP:            146/105 mmHg Patient Gender: M             HR:           129 bpm. Exam Location:  Inpatient Procedure: 2D Echo, Color Doppler and Cardiac Doppler Indications:    Atrial Fibrillation I48.91  History:        Patient has prior history of Echocardiogram examinations, most                 recent 04/12/2020. CAD and Previous Myocardial Infarction; Risk                 Factors:Dyslipidemia, Diabetes, Hypertension and Sleep Apnea.  Sonographer:    Darlina Sicilian RDCS Referring Phys: [place]  1. Left ventricular ejection fraction, by estimation, is 55 to 60%. The left ventricle has normal function. The left ventricle demonstrates regional wall motion abnormalities. Mild basal inferior hypokinesis based on limited views consistent with prior TTE. There is severe concentric left ventricular hypertrophy. LV diastolic function is indeterminant due to atrial fibrilltion.  2. Right ventricular systolic function is normal. The right ventricular size is normal. Tricuspid regurgitation signal is inadequate for assessing PA pressure.  3. The mitral valve is normal in structure. Trivial mitral valve regurgitation. No evidence of mitral stenosis.  4. The aortic valve is tricuspid. Aortic valve regurgitation is not visualized. No aortic stenosis is present.  5. The inferior vena cava is normal in size with greater than 50% respiratory variability, suggesting right atrial pressure of 3 mmHg. Comparison(s): Compared to prior TTE on 04/12/2020, the patient is now in Afib with RVR. Otherwise, no significant change. FINDINGS  Left Ventricle: Left ventricular ejection fraction, by estimation, is 55 to 60%. The left ventricle has normal function.  The left ventricle demonstrates regional wall motion abnormalities. Mild basal inferior hypokinesis based on limited views consistent with prior TTE. The left ventricular internal cavity size was normal in size. There is severe concentric left ventricular hypertrophy.  LV diastolic function is indeterminant due to atrial fibrilltion. Right Ventricle: The right ventricular size is normal. Right vetricular wall thickness was not well visualized. Right ventricular systolic function is normal. Tricuspid regurgitation signal is inadequate for assessing PA pressure. Left Atrium: Left atrial size was normal in size. Right Atrium: Right atrial size was normal in size. Pericardium: There is no evidence of pericardial effusion. Mitral Valve: The mitral valve is normal in structure. Trivial mitral valve regurgitation. No evidence of mitral valve stenosis. Tricuspid Valve: The tricuspid valve is normal in structure. Tricuspid valve regurgitation is trivial. Aortic Valve: The aortic valve is tricuspid. Aortic valve regurgitation is not visualized. No aortic stenosis is present. Pulmonic Valve: The pulmonic valve was not well visualized. Pulmonic valve regurgitation is trivial. Aorta: The aortic root and ascending aorta are structurally normal, with no evidence of dilitation. Venous: The inferior vena cava is normal in size with greater than 50% respiratory variability, suggesting right atrial pressure of 3 mmHg. IAS/Shunts: No atrial level shunt detected by color flow Doppler.  LEFT VENTRICLE PLAX 2D LVIDd:         4.20 cm LVIDs:         3.20 cm LV PW:         1.30 cm LV IVS:        1.60 cm LVOT diam:     2.00 cm LV SV:         44 LV SV Index:   19 LVOT Area:     3.14 cm  RIGHT VENTRICLE RV S prime:     14.90 cm/s TAPSE (M-mode): 1.8 cm LEFT ATRIUM             Index       RIGHT ATRIUM           Index LA diam:        3.40 cm 1.47 cm/m  RA Area:     18.40 cm LA Vol (A2C):   43.2 ml 18.66 ml/m RA Volume:   42.50 ml  18.36 ml/m LA Vol (A4C):   36.0 ml 15.55 ml/m LA Biplane Vol: 40.9 ml 17.67 ml/m  AORTIC VALVE LVOT Vmax:   85.40 cm/s LVOT Vmean:  55.200 cm/s LVOT VTI:    0.139 m  AORTA Ao Root diam: 3.60 cm MITRAL VALVE MV Area (PHT): 7.00 cm     SHUNTS MV Decel Time: 108 msec     Systemic  VTI:  0.14 m MV E velocity: 106.67 cm/s  Systemic Diam: 2.00 cm Gwyndolyn Kaufman MD Electronically signed by Gwyndolyn Kaufman MD Signature Date/Time: 10/12/2020/11:47:36 AM    Final      LOS: 2 days   Antonieta Pert, MD [facility]  10/13/2020, 11:38 AM

## 2020-10-14 ENCOUNTER — Inpatient Hospital Stay (HOSPITAL_COMMUNITY): Payer: Medicare HMO | Admitting: Anesthesiology

## 2020-10-14 ENCOUNTER — Encounter (HOSPITAL_COMMUNITY)
Admission: EM | Disposition: A | Discharge status: Home / Self Care | Payer: Self-pay | Source: Home / Self Care | Attending: Internal Medicine

## 2020-10-14 ENCOUNTER — Inpatient Hospital Stay (HOSPITAL_COMMUNITY): Payer: Medicare HMO

## 2020-10-14 ENCOUNTER — Encounter (HOSPITAL_COMMUNITY): Payer: Self-pay | Admitting: Internal Medicine

## 2020-10-14 DIAGNOSIS — I4892 Unspecified atrial flutter: Secondary | ICD-10-CM | POA: Diagnosis not present

## 2020-10-14 DIAGNOSIS — I2510 Atherosclerotic heart disease of native coronary artery without angina pectoris: Secondary | ICD-10-CM | POA: Diagnosis not present

## 2020-10-14 DIAGNOSIS — I484 Atypical atrial flutter: Secondary | ICD-10-CM

## 2020-10-14 DIAGNOSIS — I313 Pericardial effusion (noninflammatory): Secondary | ICD-10-CM | POA: Diagnosis not present

## 2020-10-14 DIAGNOSIS — I361 Nonrheumatic tricuspid (valve) insufficiency: Secondary | ICD-10-CM

## 2020-10-14 DIAGNOSIS — I340 Nonrheumatic mitral (valve) insufficiency: Secondary | ICD-10-CM

## 2020-10-14 DIAGNOSIS — I4891 Unspecified atrial fibrillation: Secondary | ICD-10-CM | POA: Diagnosis not present

## 2020-10-14 HISTORY — PX: CARDIOVERSION: SHX1299

## 2020-10-14 HISTORY — PX: BUBBLE STUDY: SHX6837

## 2020-10-14 HISTORY — PX: TEE WITHOUT CARDIOVERSION: SHX5443

## 2020-10-14 LAB — BASIC METABOLIC PANEL
Anion gap: 6 (ref 5–15)
BUN: 20 mg/dL (ref 8–23)
CO2: 22 mmol/L (ref 22–32)
Calcium: 8.8 mg/dL — ABNORMAL LOW (ref 8.9–10.3)
Chloride: 106 mmol/L (ref 98–111)
Creatinine, Ser: 1.21 mg/dL (ref 0.61–1.24)
GFR, Estimated: 59 mL/min — ABNORMAL LOW (ref >60)
Glucose, Bld: 149 mg/dL — ABNORMAL HIGH (ref 70–99)
Potassium: 4 mmol/L (ref 3.5–5.1)
Sodium: 134 mmol/L — ABNORMAL LOW (ref 135–145)

## 2020-10-14 LAB — GLUCOSE, CAPILLARY
Glucose-Capillary: 123 mg/dL — ABNORMAL HIGH (ref 70–99)
Glucose-Capillary: 329 mg/dL — ABNORMAL HIGH (ref 70–99)
Glucose-Capillary: 238 mg/dL — ABNORMAL HIGH (ref 70–99)
Glucose-Capillary: 313 mg/dL — ABNORMAL HIGH (ref 70–99)
Glucose-Capillary: 257 mg/dL — ABNORMAL HIGH (ref 70–99)

## 2020-10-14 SURGERY — ECHOCARDIOGRAM, TRANSESOPHAGEAL
Anesthesia: Monitor Anesthesia Care

## 2020-10-14 MED ORDER — INSULIN GLARGINE 100 UNIT/ML ~~LOC~~ SOLN
10.0000 [IU] | Freq: Once | SUBCUTANEOUS | Status: AC
  Administered 2020-10-14: 10 [IU] via SUBCUTANEOUS
  Filled 2020-10-14: qty 0.1

## 2020-10-14 MED ORDER — SODIUM CHLORIDE 0.9 % IV SOLN: INTRAVENOUS | Status: DC

## 2020-10-14 MED ORDER — PHENYLEPHRINE 40 MCG/ML (10ML) SYRINGE FOR IV PUSH (FOR BLOOD PRESSURE SUPPORT)
PREFILLED_SYRINGE | INTRAVENOUS | Status: DC | PRN
  Administered 2020-10-14: 120 ug via INTRAVENOUS
  Administered 2020-10-14: 80 ug via INTRAVENOUS
  Administered 2020-10-14 (×2): 160 ug via INTRAVENOUS
  Administered 2020-10-14: 80 ug via INTRAVENOUS

## 2020-10-14 MED ORDER — PROPOFOL 500 MG/50ML IV EMUL
INTRAVENOUS | Status: DC | PRN
  Administered 2020-10-14: 75 ug/kg/min via INTRAVENOUS

## 2020-10-14 MED ORDER — INSULIN GLARGINE 100 UNIT/ML ~~LOC~~ SOLN
44.0000 [IU] | Freq: Every day | SUBCUTANEOUS | Status: DC
  Administered 2020-10-14: 44 [IU] via SUBCUTANEOUS
  Filled 2020-10-14 (×3): qty 0.44

## 2020-10-14 MED ORDER — BUTAMBEN-TETRACAINE-BENZOCAINE 2-2-14 % EX AERO
INHALATION_SPRAY | CUTANEOUS | Status: DC | PRN
  Administered 2020-10-14: 2 via TOPICAL

## 2020-10-14 MED ORDER — METOPROLOL TARTRATE 50 MG PO TABS
50.0000 mg | ORAL_TABLET | Freq: Two times a day (BID) | ORAL | Status: DC
  Administered 2020-10-14 – 2020-10-15 (×2): 50 mg via ORAL
  Filled 2020-10-14 (×2): qty 1

## 2020-10-14 MED ORDER — PROPOFOL 10 MG/ML IV BOLUS
INTRAVENOUS | Status: DC | PRN
  Administered 2020-10-14: 20 mg via INTRAVENOUS

## 2020-10-14 MED ORDER — LACTATED RINGERS IV SOLN: INTRAVENOUS | Status: DC | PRN

## 2020-10-14 NOTE — Interval H&P Note (Signed)
History and Physical Interval Note:  10/14/2020 7:41 AM  Ryan Garcia  has presented today for surgery, with the diagnosis of A-FLUTTER.  The various methods of treatment have been discussed with the patient and family. After consideration of risks, benefits and other options for treatment, the patient has consented to  Procedure(s): TRANSESOPHAGEAL ECHOCARDIOGRAM (TEE) (N/A) CARDIOVERSION (N/A) as a surgical intervention.  The patient's history has been reviewed, patient examined, no change in status, stable for surgery.  I have reviewed the patient's chart and labs.  Questions were answered to the patient's satisfaction.     [name]

## 2020-10-14 NOTE — Care Management Important Message (Signed)
Important Message  Patient Details  Name: Ryan Garcia MRN: [id number] Date of Birth: February 10, 1934   Medicare Important Message Given:  Yes - Important Message mailed due to current National Emergency  Verbal consent obtained due to current National Emergency  Relationship to patient: Self Contact Name: Tyeson Tanimoto Call Date: 10/14/20  Time: 1353 Phone: [phone] Outcome: No Answer/Busy Important Message mailed to: Patient address on file    Delorse Lek 10/14/2020, 1:53 PM

## 2020-10-14 NOTE — Progress Notes (Signed)
PROGRESS NOTE    Ryan Garcia  TIW:[id number] DOB: Jul 06, 1933 DOA: 10/11/2020 PCP: Abner Greenspan, MD   Chief Complaint  Patient presents with  . Chest Pain  Brief Narrative: 87 year old male with history of CAD status post previous stent, hard of hearing, diabetes hypertension, BPH, OSA, morbid obesity, osteoarthritis brought in by wife due to sudden onset of left-sided chest pain that is started in the afternoon of 4/12, pressure-like sharp in sensation also with palpitation. In the ED initial troponin was negative, vitals are stable but was tachycardic and found to be A. fib with RVR.  Chest x-ray no acute finding.  Cardiology was curb sided advised to increase beta-blocker.  Patient was placed on heparin patient and admitted. Seen by cardiology underwent cardioversion 4/15 with TEE  Subjective: Seen this morning post cardioversion sinus rhythm with PAC.   Having wife at the bedside.  No new complaints.    Assessment & Plan:  New onset a flutter with RVR/A. Fib: Status post TEE/DCCV, remains in sinus rhythm with PACs, continue Eliquis, metoprolol monitor on telemetry.TSH elevated 8.4 but free T4 normal. Echo reviewed EF 55 to 60% left ventricular regional wall motion normalities severe concentric LVH.  Await further cardiac recommendation  Chest Pain likely from #1, troponins negative.  CTA was done that did not show any acute finding, area of artifact suspected overlying the lower lobe branch of the right pulmonary artery without definite evidence of pulmonary embolism.  Continue Imdur Crestor and aspirin.  CAD S/P percutaneous coronary angioplasty: Previously declined CABG for multivessel disease.  Continue his Crestor aspirin Imdur and metoprolol  Hyperlipidemia associated with type 2 diabetes mellitus:LDL is stable 41 HDL low 31.continue his Crestor  GERD: No issues  Uncontrolled type 2 diabetes mellitus with hyperglycemia: Blood sugar uncontrolled this morning as Lantus dose  was decreased yesterday for NPO - we will give additional Lantus and resume Lantus 44 units-HBA1c uncontrolled at 8.0. Recent Labs  Lab 10/13/20 1129 10/13/20 1617 10/13/20 2107 10/14/20 0748 10/14/20 1139  GLUCAP 232* 202* 187* 123* 329*   Morbid obesity BMI 33:Will benefit with weight loss and PCP follow-up.  Diet Order            Diet Heart Room service appropriate? Yes; Fluid consistency: Thin  Diet effective now               Patient's Body mass index is 33.54 kg/m. DVT prophylaxis: HEPARIN GTT Code Status:   Code Status: Full Code  Family Communication: plan of care discussed with patient at bedside. Wife updated at the bedside.  Status is: Inpatient Remains inpatient appropriate because:Inpatient level of care appropriate due to severity of illness Dispo: The patient is from: Home              Anticipated d/c is to: Home.  Once okay with cardiology              Patient currently is not medically stable to d/c.   Difficult to place patient No  Unresulted Labs (From admission, onward)          Start     Ordered   10/13/20 2505  Basic metabolic panel  Daily,   R     Question:  Specimen collection method  Answer:  Lab=Lab collect   10/12/20 1351          Medications reviewed:  Scheduled Meds: . apixaban  5 mg Oral BID  . aspirin EC  81 mg Oral Daily  .  chlorhexidine  15 mL Mouth Rinse BID  . glipiZIDE  10 mg Oral BID AC  . insulin aspart  0-15 Units Subcutaneous TID WC  . insulin aspart  0-5 Units Subcutaneous QHS  . insulin glargine  24 Units Subcutaneous QHS  . isosorbide mononitrate  60 mg Oral Daily  . loratadine  10 mg Oral Daily  . mouth rinse  15 mL Mouth Rinse q12n4p  . metoprolol tartrate  50 mg Oral BID  . rosuvastatin  40 mg Oral QHS  . vitamin B-12  1,000 mcg Oral Daily   Continuous Infusions:   Consultants:see note  Procedures:see note  Antimicrobials: Anti-infectives (From admission, onward)   None      Culture/Microbiology  Other culture-see note  Objective: Vitals: Today's Vitals   10/14/20 0915 10/14/20 0923 10/14/20 0947 10/14/20 1135  BP: (!) 89/52 (!) 93/56 102/67 99/72  Pulse: (!) 54 (!) 125 77 69  Resp: 19 20 18    Temp:   97.8 F (36.6 C)   TempSrc:   Oral   SpO2: 96% 98% 100% 91%  Weight:      Height:      PainSc:  0-No pain 0-No pain 0-No pain    Intake/Output Summary (Last 24 hours) at 10/14/2020 1225 Last data filed at 10/14/2020 1030 Gross per 24 hour  Intake 1400 ml  Output 150 ml  Net 1250 ml   Filed Weights   10/13/20 0743 10/14/20 0357 10/14/20 0730  Weight: 103.5 kg 110.6 kg 110.6 kg   Weight change: -7.404 kg  Intake/Output from previous day: 04/14 0701 - 04/15 0700 In: 260 [P.O.:260] Out: 760 [Urine:760] Intake/Output this shift: Total I/O In: 1140 [P.O.:240; I.V.:900] Out: -  Filed Weights   10/13/20 0743 10/14/20 0357 10/14/20 0730  Weight: 103.5 kg 110.6 kg 110.6 kg    Examination: General exam: AAOx3,NAD, weak appearing. HEENT:Oral mucosa moist, Ear/Nose WNL grossly, dentition normal. Respiratory system: bilaterally clear,no wheezing or crackles,no use of accessory muscle. Cardiovascular system: S1 & S2 +, No JVD. Gastrointestinal system: Abdomen soft, NT,ND, BS+. Nervous System:Alert, awake, moving extremities and grossly non-focal. Extremities: No edema, distal peripheral pulses palpable.  Skin:No rashes,no icterus. KGM:WNUUVO muscle bulk,tone, power.  Data Reviewed: I have personally reviewed following labs and imaging studies CBC: Recent Labs  Lab 10/11/20 1737 10/12/20 0927 10/13/20 0257  WBC 6.5 6.9 8.0  NEUTROABS 4.2  --   --   HGB 11.8* 14.5 14.4  HCT 35.9* 44.8 43.1  MCV 94.2 93.9 92.9  PLT 156 144* 536   Basic Metabolic Panel: Recent Labs  Lab 10/11/20 1737 10/12/20 0720 10/12/20 0854 10/13/20 0257 10/14/20 0340  NA 140 139  --  140 134*  K 4.5 4.1  --  3.9 4.0  CL 115* 108  --  109 106  CO2 21* 23   --  21* 22  GLUCOSE 175* 154*  --  137* 149*  BUN 20 16  --  19 20  CREATININE 1.01 1.02  --  1.06 1.21  CALCIUM 7.4* 9.0  --  8.9 8.8*  MG  --   --  1.8  --   --    GFR: Estimated Creatinine Clearance: 57.9 mL/min (by C-G formula based on SCr of 1.21 mg/dL). Liver Function Tests: Recent Labs  Lab 10/11/20 1737  AST 30  ALT 14  ALKPHOS 54  BILITOT 0.7  PROT 4.9*  ALBUMIN 2.8*   No results for input(s): LIPASE, AMYLASE in the last 168 hours. No results for  input(s): AMMONIA in the last 168 hours. Coagulation Profile: Recent Labs  Lab 10/13/20 2210  INR 1.2   Cardiac Enzymes: No results for input(s): CKTOTAL, CKMB, CKMBINDEX, TROPONINI in the last 168 hours. BNP (last 3 results) No results for input(s): PROBNP in the last 8760 hours. HbA1C: Recent Labs    10/11/20 2211  HGBA1C 8.0*   CBG: Recent Labs  Lab 10/13/20 1129 10/13/20 1617 10/13/20 2107 10/14/20 0748 10/14/20 1139  GLUCAP 232* 202* 187* 123* 329*   Lipid Profile: Recent Labs    10/12/20 0720  CHOL 113  HDL 41  LDLCALC 57  TRIG 73  CHOLHDL 2.8   Thyroid Function Tests: Recent Labs    10/12/20 0854  TSH 8.470*  FREET4 0.74   Anemia Panel: No results for input(s): VITAMINB12, FOLATE, FERRITIN, TIBC, IRON, RETICCTPCT in the last 72 hours. Sepsis Labs: No results for input(s): PROCALCITON, LATICACIDVEN in the last 168 hours.  Recent Results (from the past 240 hour(s))  SARS CORONAVIRUS 2 (TAT 6-24 HRS) Nasopharyngeal Nasopharyngeal Swab     Status: None   Collection Time: 10/11/20  9:54 PM   Specimen: Nasopharyngeal Swab  Result Value Ref Range Status   SARS Coronavirus 2 NEGATIVE NEGATIVE Final    Comment: (NOTE) SARS-CoV-2 target nucleic acids are NOT DETECTED.  The SARS-CoV-2 RNA is generally detectable in upper and lower respiratory specimens during the acute phase of infection. Negative results do not preclude SARS-CoV-2 infection, do not rule out co-infections with other  pathogens, and should not be used as the sole basis for treatment or other patient management decisions. Negative results must be combined with clinical observations, patient history, and epidemiological information. The expected result is Negative.  Fact Sheet for Patients: SugarRoll.be  Fact Sheet for Healthcare Providers: https://www.woods-mathews.com/  This test is not yet approved or cleared by the [place] FDA and  has been authorized for detection and/or diagnosis of SARS-CoV-2 by FDA under an Emergency Use Authorization (EUA). This EUA will remain  in effect (meaning this test can be used) for the duration of the COVID-19 declaration under Se ction 564(b)(1) of the Act, 21 U.S.C. section 360bbb-3(b)(1), unless the authorization is terminated or revoked sooner.  Performed at [facility], [place] [address] [place], [place] [address]      Radiology Studies: No results found.   LOS: 3 days   Antonieta Pert, MD [facility]  10/14/2020, 12:25 PM

## 2020-10-14 NOTE — Progress Notes (Addendum)
Progress Note  Patient Name: Ryan Garcia Date of Encounter: 10/14/2020  [facility] Cardiologist: Kirk Ruths, MD   Subjective   S/p successful DCCV this morning, now in NSR with rate 60-70s.  No chest pain or dyspnea.  Inpatient Medications    Scheduled Meds: . apixaban  5 mg Oral BID  . aspirin EC  81 mg Oral Daily  . chlorhexidine  15 mL Mouth Rinse BID  . glipiZIDE  10 mg Oral BID AC  . insulin aspart  0-15 Units Subcutaneous TID WC  . insulin aspart  0-5 Units Subcutaneous QHS  . insulin glargine  24 Units Subcutaneous QHS  . isosorbide mononitrate  60 mg Oral Daily  . loratadine  10 mg Oral Daily  . mouth rinse  15 mL Mouth Rinse q12n4p  . metoprolol tartrate  50 mg Oral BID  . rosuvastatin  40 mg Oral QHS  . vitamin B-12  1,000 mcg Oral Daily   Continuous Infusions:  PRN Meds: acetaminophen, acetaminophen, fluticasone, metoprolol tartrate, nitroGLYCERIN, ondansetron (ZOFRAN) IV   Vital Signs    Vitals:   10/14/20 0915 10/14/20 0923 10/14/20 0947 10/14/20 1135  BP: (!) 89/52 (!) 93/56 102/67 99/72  Pulse: (!) 54 (!) 125 77 69  Resp: 19 20 18    Temp:   97.8 F (36.6 C)   TempSrc:   Oral   SpO2: 96% 98% 100% 91%  Weight:      Height:        Intake/Output Summary (Last 24 hours) at 10/14/2020 1216 Last data filed at 10/14/2020 1030 Gross per 24 hour  Intake 1400 ml  Output 150 ml  Net 1250 ml   Last 3 Weights 10/14/2020 10/14/2020 10/13/2020  Weight (lbs) 243 lb 14.4 oz 243 lb 14.4 oz 228 lb 2.8 oz  Weight (kg) 110.632 kg 110.632 kg 103.5 kg      Telemetry    NSR with rate 60-70s - Personally Reviewed  ECG    No new ECG tracing today. - Personally Reviewed  Physical Exam   GEN: No acute distress.   Neck: No JVD. Cardiac: Tachycardic with irregular rhythm. No murmurs, rubs, or gallops.  Respiratory: Clear to auscultation bilaterally. No wheezes, rhonchi, or rales. GI: Soft, non-distended, and non-tender. MS: Trace lower extremity  edema. No deformity. Skin: Warm and dry. Neuro:  No focal deficits. Psych: Normal affect. Responds appropriately.  Labs    High Sensitivity Troponin:   Recent Labs  Lab 10/11/20 1737 10/11/20 2042  TROPONINIHS 8 14      Chemistry Recent Labs  Lab 10/11/20 1737 10/12/20 0720 10/13/20 0257 10/14/20 0340  NA 140 139 140 134*  K 4.5 4.1 3.9 4.0  CL 115* 108 109 106  CO2 21* 23 21* 22  GLUCOSE 175* 154* 137* 149*  BUN 20 16 19 20   CREATININE 1.01 1.02 1.06 1.21  CALCIUM 7.4* 9.0 8.9 8.8*  PROT 4.9*  --   --   --   ALBUMIN 2.8*  --   --   --   AST 30  --   --   --   ALT 14  --   --   --   ALKPHOS 54  --   --   --   BILITOT 0.7  --   --   --   GFRNONAA >60 >60 >60 59*  ANIONGAP 4* 8 10 6      Hematology Recent Labs  Lab 10/11/20 1737 10/12/20 0927 10/13/20 0257  WBC 6.5  6.9 8.0  RBC 3.81* 4.77 4.64  HGB 11.8* 14.5 14.4  HCT 35.9* 44.8 43.1  MCV 94.2 93.9 92.9  MCH 31.0 30.4 31.0  MCHC 32.9 32.4 33.4  RDW 15.8* 15.7* 15.6*  PLT 156 144* 158    BNPNo results for input(s): BNP, PROBNP in the last 168 hours.   DDimer No results for input(s): DDIMER in the last 168 hours.   Radiology    No results found.  Cardiac Studies   Left Cardiac Catheterization 04/12/2020:  Prox LAD lesion is 85% stenosed.  Ost 1st Diag to 1st Diag lesion is 85% stenosed.  Dist LAD lesion is 65% stenosed.  Prox LAD to Mid LAD lesion is 75% stenosed.  Ost Cx to Mid Cx lesion is 80% stenosed.  Ost 1st Mrg to 1st Mrg lesion is 95% stenosed.  Mid Cx to Dist Cx lesion is 65% stenosed.  2nd Mrg lesion is 75% stenosed.  RPDA lesion is 60% stenosed.  1st RPL lesion is 70% stenosed.  Prox RCA to Mid RCA lesion is 30% stenosed.  Previously placed Dist RCA drug eluting stent is widely patent.  Balloon angioplasty was performed.  LV end diastolic pressure is normal.   1. Severe 2 vessel obstructive CAD 2. Patent stent in the RCA 3. Normal LVEDP  Plan: The severe  disease in the left coronary distribution is unchanged from prior. No suitable targets for PCI. Recommend continued medical therapy. If refractory angina would need to consider CABG.  Diagnostic Dominance: Right     _______________  Echocardiogram 10/12/2020: Impressions: 1. Left ventricular ejection fraction, by estimation, is 55 to 60%. The  left ventricle has normal function. The left ventricle demonstrates  regional wall motion abnormalities. Mild basal inferior hypokinesis based  on limited views consistent with prior  TTE. There is severe concentric left ventricular hypertrophy. LV diastolic  function is indeterminant due to atrial fibrilltion.  2. Right ventricular systolic function is normal. The right ventricular  size is normal. Tricuspid regurgitation signal is inadequate for assessing  PA pressure.  3. The mitral valve is normal in structure. Trivial mitral valve  regurgitation. No evidence of mitral stenosis.  4. The aortic valve is tricuspid. Aortic valve regurgitation is not  visualized. No aortic stenosis is present.  5. The inferior vena cava is normal in size with greater than 50%  respiratory variability, suggesting right atrial pressure of 3 mmHg.   Comparison(s): Compared to prior TTE on 04/12/2020, the patient is now in  Afib with RVR. Otherwise, no significant change.  Patient Profile     87 y.o. male with a history of diffuse three vessel CAD s/p DES to distal RCA in 06/2017 and on medical therapy due to declining CABG, hypertension, hyperlipidemia, type 2 diabetes, obstructive sleep apnea on CPAP, GERD, BPH, and obesity who is being seen for evaluation of chest pain and atrial flutter with RVR.  Assessment & Plan    New Onset Atrial Flutter with RVR - Patient presented with chest pain and palpitations and was found to be in atrial flutter with RVR.  - Electrolytes within normal limits. - TSH elevated at 8.47 but free T4 normal.  - Echo showed LVEF  of 55-60% with mild basal inferior hypokinesis (unchanged from prior study). - CHA2DS2-VASC = 5 (CAD, HTN, DM, age x2). Started on Eliquis 5mg  twice daily. - Underwent successful TEE/DCCV this morning - Continue metoprolol, will consolidate to 50 mg BID - Soft BPs post cardioversion, likely due to sedation.  Asymptomatic,  will monitor.  If stable, likely home tomorrow  Chest Pain History of CAD - History of diffuse 3 vessel CAD s/p DES to distal RCA. Last cath in 04/2020 showed stable disease. Patient has declined CABG in the past and is being treated medically. - High-sensitivity troponin negative x2. - Echo as above. - Chest pain has resolved. Felt to be secondary to atrial flutter. - Continue aspirin and high-intensity statin.  - Continue beta-blocker as above. - Continue Imdur 60mg  daily.  Hypertension - Soft BPs post cardioversion.  Decrease metoprolol to 50 mg BID - Continue Imdur as above.  Hyperlipidemia - Lipid panel this admission: Total Cholesterol 113, Triglycerides 73, HDL 41, LDL 57. - LDL goal <70 given CAD. - Continue Crestor 40mg  daily.  Type 2 Diabetes - Management per primary team.  Obstructive Sleep Apnea - Compliant with CPAP.  Elevated TSH - TSH elevated at 8.47 this admission. Free T4 normal. - Management per primary team.   For questions or updates, please contact [place] Please consult [email or web address] for contact info under      Signed, Donato Heinz, MD  10/14/2020, 12:16 PM

## 2020-10-14 NOTE — Progress Notes (Signed)
   10/14/20 1125  Clinical Encounter Type  Visited With Patient and family together  Visit Type Follow-up   Chaplain coordinated notary and witnesses for notarization at 1125 with notary Erin. Chaplain put one copy in Pt's physical file and gave the original and copies to Pt's wife. Chaplain remains available.   This note was prepared by Chaplain Resident, Dante Gang, MDiv. Chaplain remains available as needed through the on-call pager: [phone].

## 2020-10-14 NOTE — CV Procedure (Signed)
INDICATIONS: Atrial flutter  PROCEDURE:   Informed consent was obtained prior to the procedure. The risks, benefits and alternatives for the procedure were discussed and the patient comprehended these risks.  Risks include, but are not limited to, cough, sore throat, vomiting, nausea, somnolence, esophageal and stomach trauma or perforation, bleeding, low blood pressure, aspiration, pneumonia, infection, trauma to the teeth and death.    After a procedural time-out, the oropharynx was anesthetized with 20% benzocaine spray.   During this procedure the patient was administered propofol per anesthesia.  The patient's heart rate, blood pressure, and oxygen saturation were monitored continuously during the procedure. The period of conscious sedation was 30 minutes, of which I was present face-to-face 100% of this time.  The transesophageal probe was inserted in the esophagus and stomach without difficulty and multiple views were obtained.  The patient was kept under observation until the patient left the procedure room.  The patient left the procedure room in stable condition.   Agitated microbubble saline contrast was administered.  COMPLICATIONS:    There were no immediate complications.  FINDINGS:   FORMAL ECHOCARDIOGRAM REPORT PENDING No LA appendage thrombus. No LA mural thrombus. No LV apical thrombus.   Prominent epicardial fat.   Mild MR, Mild-mod TR  Negative for PFO  RECOMMENDATIONS:     proceed to cardioversion  Procedure: Electrical Cardioversion Indications:  Atrial Flutter  Procedure Details:  Consent: Risks of procedure as well as the alternatives and risks of each were explained to the (patient/caregiver).  Consent for procedure obtained.  Time Out: Verified patient identification, verified procedure, site/side was marked, verified correct patient position, special equipment/implants available, medications/allergies/relevent history reviewed, required imaging and  test results available. PERFORMED.  Patient placed on cardiac monitor, pulse oximetry, supplemental oxygen as necessary.  Sedation given: propofol per anesthesia Pacer pads placed anterior and posterior chest.  Cardioverted 1 time(s).  Cardioversion with synchronized biphasic 120J shock.  Evaluation: Findings: Post procedure EKG shows: sinus bradycardia with PACs Complications: None Patient did tolerate procedure well.  Time Spent Directly with the Patient:  60 minutes   [name] 10/14/2020, 9:26 AM

## 2020-10-14 NOTE — Progress Notes (Signed)
  Echocardiogram Echocardiogram Transesophageal with color, doppler, and 3D has been performed.  Darlina Sicilian M 10/14/2020, 9:33 AM

## 2020-10-14 NOTE — Plan of Care (Signed)

## 2020-10-14 NOTE — Progress Notes (Signed)
Occupational Therapy Treatment and Discharge Patient Details Name: Ryan Garcia MRN: [id number] DOB: April 19, 1934 Today's Date: 10/14/2020    History of present illness 87 y.o. male [facility] to [facility] ED on 10/11/2020 with reports of L-sided chest pain. Pt noted to have intermittent Afib in ED. PMH includes OA, CAD, chronic back pain, DM, GERD, skin cancer, NSTEMI, OSA, scoliosis.   OT comments  Pt completed all OT goals this session. Per wife, pt, and OT observation, pt is back at baseline and safety to discharge from OT. Pt completing all ADL's and functional mobility with mod I, using no DME. Pt will be discharged from acute OT.   Follow Up Recommendations       Equipment Recommendations  None recommended by OT    Recommendations for Other Services      Precautions / Restrictions Precautions Precautions: Fall Restrictions Weight Bearing Restrictions: No       Mobility Bed Mobility               General bed mobility comments: Pr recieved in recliner upon entry    Transfers Overall transfer level: Modified independent Equipment used: None Transfers: Sit to/from Stand Sit to Stand: Modified independent (Device/Increase time)         General transfer comment: No LOB noted    Balance Overall balance assessment: Modified Independent Sitting-balance support: No upper extremity supported;Feet supported Sitting balance-Leahy Scale: Good     Standing balance support: No upper extremity supported Standing balance-Leahy Scale: Good                             ADL either performed or assessed with clinical judgement   ADL Overall ADL's : Modified independent;At baseline     Grooming: Standing;Modified independent           Upper Body Dressing : Independent;Sitting   Lower Body Dressing: Modified independent;With adaptive equipment;Sitting/lateral leans;Sit to/from stand               Functional mobility during ADLs: Modified  independent General ADL Comments: Pt able to complete all ADL's with no DME and functional mobility for 10 mins with mod I, occasionally grabbing furniture when he walks.     Vision       Perception     Praxis      Cognition Arousal/Alertness: Awake/alert Behavior During Therapy: WFL for tasks assessed/performed Overall Cognitive Status: Within Functional Limits for tasks assessed                                          Exercises     Shoulder Instructions       General Comments VSS on RA    Pertinent Vitals/ Pain       Pain Assessment: No/denies pain  Home Living                                          Prior Functioning/Environment              Frequency           Progress Toward Goals  OT Goals(current goals can now be found in the care plan section)  Progress towards OT goals: Goals met/education completed, patient discharged from OT  Acute  Rehab OT Goals Patient Stated Goal: I want to go home and start living again. OT Goal Formulation: With patient/family ADL Goals Pt Will Perform Grooming: Independently Pt Will Perform Lower Body Bathing: Independently;sit to/from stand Pt Will Perform Lower Body Dressing: Independently;sit to/from stand Pt Will Transfer to Toilet: Independently;ambulating Pt Will Perform Toileting - Clothing Manipulation and hygiene: Independently;sit to/from stand  Plan All goals met and education completed, patient discharged from OT services    Co-evaluation                 AM-PAC OT "6 Clicks" Daily Activity     Outcome Measure   Help from another person eating meals?: None Help from another person taking care of personal grooming?: None Help from another person toileting, which includes using toliet, bedpan, or urinal?: None Help from another person bathing (including washing, rinsing, drying)?: None Help from another person to put on and taking off regular upper body  clothing?: None Help from another person to put on and taking off regular lower body clothing?: None 6 Click Score: 24    End of Session Equipment Utilized During Treatment: Gait belt  OT Visit Diagnosis: Muscle weakness (generalized) (M62.81);Unsteadiness on feet (R26.81)   Activity Tolerance Patient tolerated treatment well   Patient Left in chair;with call bell/phone within reach;with family/visitor present   Nurse Communication Mobility status        Time: 9390-3009 OT Time Calculation (min): 13 min  Charges: OT General Charges $OT Visit: 1 Visit OT Treatments $Self Care/Home Management : 8-22 mins  Tajae Rybicki H., OTR/L Acute Rehabilitation  Lisa Blakeman Elane Yolanda Bonine 10/14/2020, 5:35 PM

## 2020-10-14 NOTE — Transfer of Care (Signed)
Immediate Anesthesia Transfer of Care Note  Patient: Ryan Garcia  Procedure(s) Performed: TRANSESOPHAGEAL ECHOCARDIOGRAM (TEE) (N/A ) CARDIOVERSION (N/A ) BUBBLE STUDY  Patient Location: Endoscopy Unit  Anesthesia Type:MAC  Level of Consciousness: drowsy and patient cooperative  Airway & Oxygen Therapy: Patient Spontanous Breathing  Post-op Assessment: Report given to RN and Post -op Vital signs reviewed and stable  Post vital signs: Reviewed and stable  Last Vitals:  Vitals Value Taken Time  BP 105/56 10/14/20 0903  Temp    Pulse 63 10/14/20 0903  Resp 20 10/14/20 0903  SpO2 97 % 10/14/20 0903    Last Pain:  Vitals:   10/14/20 0903  TempSrc:   PainSc: 0-No pain      Patients Stated Pain Goal: 0 (32/00/37 9444)  Complications: No complications documented.

## 2020-10-14 NOTE — Progress Notes (Signed)
   10/14/20 1044  Clinical Encounter Type  Visited With Patient and family together  Visit Type Follow-up  Referral From Nurse  Consult/Referral To Chaplain   Chaplain responded to page that AD was ready for notarization. Chaplain reviewed paperwork with Pt and Pt's wife, Legrand Rams. Chaplain confirmed that notary and witnesses would come by at 11:30 for signing.  This note was prepared by Chaplain Resident, Dante Gang, MDiv. Chaplain remains available as needed through the on-call pager: [phone].

## 2020-10-15 DIAGNOSIS — I4891 Unspecified atrial fibrillation: Secondary | ICD-10-CM

## 2020-10-15 DIAGNOSIS — I484 Atypical atrial flutter: Secondary | ICD-10-CM | POA: Diagnosis not present

## 2020-10-15 LAB — GLUCOSE, CAPILLARY
Glucose-Capillary: 106 mg/dL — ABNORMAL HIGH (ref 70–99)
Glucose-Capillary: 303 mg/dL — ABNORMAL HIGH (ref 70–99)

## 2020-10-15 LAB — ECHO TEE
AR max vel: 2.73 cm2
AV Area VTI: 2.83 cm2
AV Area mean vel: 2.45 cm2
AV Mean grad: 2 mmHg
AV Peak grad: 3.7 mmHg
Ao pk vel: 0.97 m/s

## 2020-10-15 LAB — BASIC METABOLIC PANEL
Anion gap: 6 (ref 5–15)
BUN: 27 mg/dL — ABNORMAL HIGH (ref 8–23)
CO2: 26 mmol/L (ref 22–32)
Calcium: 8.4 mg/dL — ABNORMAL LOW (ref 8.9–10.3)
Chloride: 108 mmol/L (ref 98–111)
Creatinine, Ser: 1.55 mg/dL — ABNORMAL HIGH (ref 0.61–1.24)
GFR, Estimated: 44 mL/min — ABNORMAL LOW (ref >60)
Glucose, Bld: 134 mg/dL — ABNORMAL HIGH (ref 70–99)
Potassium: 4.1 mmol/L (ref 3.5–5.1)
Sodium: 140 mmol/L (ref 135–145)

## 2020-10-15 MED ORDER — METOPROLOL TARTRATE 50 MG PO TABS: 50.0000 mg | ORAL_TABLET | Freq: Two times a day (BID) | ORAL | 0 refills | Status: DC

## 2020-10-15 MED ORDER — APIXABAN 5 MG PO TABS: 5.0000 mg | ORAL_TABLET | Freq: Two times a day (BID) | ORAL | 12 refills | Status: DC

## 2020-10-15 NOTE — Progress Notes (Signed)
ANTICOAGULATION CONSULT NOTE - Follow Up Consult  Pharmacy Consult for Apixaban Indication: atrial fibrillation  Allergies  Allergen Reactions  . Loratadine Other (See Comments)    Not effective  . Simvastatin Other (See Comments)    Intolerant- caused joint pain    Patient Measurements: Height: 5' 11.5" (181.6 cm) Weight: 112.3 kg (247 lb 8 oz) IBW/kg (Calculated) : 76.45 kg  Vital Signs: Temp: 98.2 F (36.8 C) (04/16 0725) Temp Source: Oral (04/16 0725) BP: 141/83 (04/16 0725) Pulse Rate: 57 (04/16 0725)  Labs: Recent Labs    10/12/20 0927 10/13/20 0257 10/13/20 2210 10/14/20 0340 10/15/20 0144  HGB 14.5 14.4  --   --   --   HCT 44.8 43.1  --   --   --   PLT 144* 158  --   --   --   LABPROT  --   --  14.7  --   --   INR  --   --  1.2  --   --   CREATININE  --  1.06  --  1.21 1.55*    Estimated Creatinine Clearance: 45.6 mL/min (A) (by C-G formula based on SCr of 1.55 mg/dL (H)).   Assessment: 87 yo male with 3vCAD s/p DES to distal RCA presents with chest pain and palpitations and found to be in new onset atrial fibrillation and is s/p successful TEE/DCV on 4/15. PTA the patient is not on anticoagulation. Pharmacy is consulted to dose apixaban.  The patient currently does not meet 2/3 of the dose adjustment criteria. However, the patient's SCr increased from 1.21>1.55. Will monitor renal function for dose adjustment.  Goal of Therapy:  Monitor platelets by anticoagulation protocol: Yes   Plan:  Continue apixaban PO 5 mg BID Watch dosing in respect to renal function, age, and weight Monitor for signs and symptoms of bleeding   Shauna Hugh, PharmD, [place]  PGY-1 Pharmacy Resident 10/15/2020 9:25 AM  Please check [email or web address] for unit-specific pharmacy phone numbers.

## 2020-10-15 NOTE — Progress Notes (Signed)
Physical Therapy Treatment Patient Details Name: Ryan Garcia MRN: [id number] DOB: 04-Dec-1933 Today's Date: 10/15/2020    History of Present Illness 87 y.o. male presents to [facility] ED on 10/11/2020 with reports of L-sided chest pain. Pt noted to have intermittent Afib in ED. PMH includes OA, CAD, chronic back pain, DM, GERD, skin cancer, NSTEMI, OSA, scoliosis.    PT Comments    Pt in recliner with wife in room on entry. Pt eager to discharge home today and is impatient when wife says it won't be until later this afternoon. Pt agreeable to ambulation with therapy to pass time. Pt is limited in safe mobility by decreased stability with gait due in great part to generalized weakness. PT relays recommendation for Outpatient PT to work on his balance to reduce risk for falls. Pt reports he will be fine he walks with a walking stick on unlevel surfaces and is convinced that his balance will be better when he has proper shoes on. PT states understanding but continues to encourage short bout of PT for balance training if not now in the future. Pt replies he will think about it.     Follow Up Recommendations  Outpatient PT     Equipment Recommendations  None recommended by PT    Recommendations for Other Services       Precautions / Restrictions Precautions Precautions: Fall Restrictions Weight Bearing Restrictions: No    Mobility  Bed Mobility               General bed mobility comments: OOB in recliner    Transfers Overall transfer level: Independent Equipment used: None                Ambulation/Gait Ambulation/Gait assistance: Supervision Gait Distance (Feet): 350 Feet Assistive device: None Gait Pattern/deviations: Step-through pattern Gait velocity: functional   General Gait Details: pt with slowed step-through gait, mild increase in lateral drift, and wide BoS for improved stability       Balance Overall balance assessment: Needs  assistance Sitting-balance support: No upper extremity supported;Feet supported Sitting balance-Leahy Scale: Good     Standing balance support: No upper extremity supported Standing balance-Leahy Scale: Good                              Cognition Arousal/Alertness: Awake/alert Behavior During Therapy: WFL for tasks assessed/performed Overall Cognitive Status: Within Functional Limits for tasks assessed                                           General Comments General comments (skin integrity, edema, etc.): VSS on RA      Pertinent Vitals/Pain Pain Assessment: No/denies pain           PT Goals (current goals can now be found in the care plan section) Acute Rehab PT Goals Patient Stated Goal: to improve balance on uneven surfaces PT Goal Formulation: With patient Time For Goal Achievement: 10/26/20 Potential to Achieve Goals: Good    Frequency    Min 3X/week      PT Plan Current plan remains appropriate       AM-PAC PT "6 Clicks" Mobility   Outcome Measure  Help needed turning from your back to your side while in a flat bed without using bedrails?: None Help needed moving from lying on your back to  sitting on the side of a flat bed without using bedrails?: None Help needed moving to and from a bed to a chair (including a wheelchair)?: None Help needed standing up from a chair using your arms (e.g., wheelchair or bedside chair)?: None Help needed to walk in hospital room?: A Little Help needed climbing 3-5 steps with a railing? : A Little 6 Click Score: 22    End of Session Equipment Utilized During Treatment: Gait belt Activity Tolerance: Patient tolerated treatment well Patient left: in chair;with call bell/phone within reach;with family/visitor present Nurse Communication: Mobility status PT Visit Diagnosis: Unsteadiness on feet (R26.81);Other abnormalities of gait and mobility (R26.89)     Time: 4097-3532 PT Time  Calculation (min) (ACUTE ONLY): 15 min  Charges:  $Therapeutic Exercise: 8-22 mins                     Prisila Dlouhy B. Migdalia Dk PT, DPT Acute Rehabilitation Services Pager [phone] Office [phone]    [place] 10/15/2020, 10:10 AM

## 2020-10-15 NOTE — Progress Notes (Signed)
Progress Note  Patient Name: Ryan Garcia Date of Encounter: 10/15/2020  [facility] Cardiologist: Kirk Ruths, MD   Subjective   Feels well, no CP or SOB. Ambulated yesterday.  Inpatient Medications    Scheduled Meds: . apixaban  5 mg Oral BID  . aspirin EC  81 mg Oral Daily  . chlorhexidine  15 mL Mouth Rinse BID  . glipiZIDE  10 mg Oral BID AC  . insulin aspart  0-15 Units Subcutaneous TID WC  . insulin aspart  0-5 Units Subcutaneous QHS  . insulin glargine  44 Units Subcutaneous QHS  . isosorbide mononitrate  60 mg Oral Daily  . loratadine  10 mg Oral Daily  . mouth rinse  15 mL Mouth Rinse q12n4p  . metoprolol tartrate  50 mg Oral BID  . rosuvastatin  40 mg Oral QHS  . vitamin B-12  1,000 mcg Oral Daily   Continuous Infusions:  PRN Meds: acetaminophen, acetaminophen, fluticasone, metoprolol tartrate, nitroGLYCERIN, ondansetron (ZOFRAN) IV   Vital Signs    Vitals:   10/14/20 2100 10/15/20 0209 10/15/20 0535 10/15/20 0725  BP: 125/74  119/76 (!) 141/83  Pulse: 62  (!) 53 (!) 57  Resp: 16  18 18   Temp: 98.3 F (36.8 C)  98.3 F (36.8 C) 98.2 F (36.8 C)  TempSrc: Oral  Oral Oral  SpO2: 97%  99% 100%  Weight:  112.3 kg    Height:        Intake/Output Summary (Last 24 hours) at 10/15/2020 0758 Last data filed at 10/14/2020 2000 Gross per 24 hour  Intake 1500 ml  Output 550 ml  Net 950 ml   Last 3 Weights 10/15/2020 10/14/2020 10/14/2020  Weight (lbs) 247 lb 8 oz 243 lb 14.4 oz 243 lb 14.4 oz  Weight (kg) 112.265 kg 110.632 kg 110.632 kg      Telemetry    Sinus brady, first deg AVB, PACs, no afib after cardioversion - Personally Reviewed  ECG    SB, first deg AVB, PACs - Personally Reviewed  Physical Exam   GEN: No acute distress.   Neck: No JVD Cardiac: RRR, no murmurs, rubs, or gallops.  Respiratory: Clear to auscultation bilaterally. GI: Soft, nontender, non-distended  MS: No edema; No deformity. Neuro:  Nonfocal  Psych: Normal  affect   Labs    High Sensitivity Troponin:   Recent Labs  Lab 10/11/20 1737 10/11/20 2042  TROPONINIHS 8 14      Chemistry Recent Labs  Lab 10/11/20 1737 10/12/20 0720 10/13/20 0257 10/14/20 0340 10/15/20 0144  NA 140   < > 140 134* 140  K 4.5   < > 3.9 4.0 4.1  CL 115*   < > 109 106 108  CO2 21*   < > 21* 22 26  GLUCOSE 175*   < > 137* 149* 134*  BUN 20   < > 19 20 27*  CREATININE 1.01   < > 1.06 1.21 1.55*  CALCIUM 7.4*   < > 8.9 8.8* 8.4*  PROT 4.9*  --   --   --   --   ALBUMIN 2.8*  --   --   --   --   AST 30  --   --   --   --   ALT 14  --   --   --   --   ALKPHOS 54  --   --   --   --   BILITOT 0.7  --   --   --   --  GFRNONAA >60   < > >60 59* 44*  ANIONGAP 4*   < > 10 6 6    < > = values in this interval not displayed.     Hematology Recent Labs  Lab 10/11/20 1737 10/12/20 0927 10/13/20 0257  WBC 6.5 6.9 8.0  RBC 3.81* 4.77 4.64  HGB 11.8* 14.5 14.4  HCT 35.9* 44.8 43.1  MCV 94.2 93.9 92.9  MCH 31.0 30.4 31.0  MCHC 32.9 32.4 33.4  RDW 15.8* 15.7* 15.6*  PLT 156 144* 158    BNPNo results for input(s): BNP, PROBNP in the last 168 hours.   DDimer No results for input(s): DDIMER in the last 168 hours.   Radiology    ECHO TEE  Result Date: 10/15/2020    TRANSESOPHOGEAL ECHO REPORT   Patient Name:   Ryan Garcia Date of Exam: 10/14/2020 Medical Rec #:  [id number]     Height:       71.5 in Accession #:    [id number]    Weight:       243.9 lb Date of Birth:  Dec 25, 1933     BSA:          2.306 m Patient Age:    87 years      BP:           93/56 mmHg Patient Gender: M             HR:           125 bpm. Exam Location:  Inpatient Procedure: 3D Echo, Transesophageal Echo, Cardiac Doppler, Color Doppler and            Saline Contrast Bubble Study Indications:    I48.4 Atypical atrial flutter  History:        Patient has prior history of Echocardiogram examinations. CAD;                 Risk Factors:Diabetes, Hypertension and Sleep Apnea.  Sonographer:     Darlina Sicilian RDCS Referring Phys: [id number] Margie Billet PROCEDURE: After discussion of the risks and benefits of a TEE, an informed consent was obtained from the patient. TEE procedure time was 18 minutes. The transesophogeal probe was passed without difficulty through the esophogus of the patient. Imaged were obtained with the patient in a left lateral decubitus position. Local oropharyngeal anesthetic was provided with Benzocaine spray. Sedation performed by different physician. The patient was monitored while under deep sedation. Anesthestetic sedation  was provided intravenously by Anesthesiology: 263.32mg  of Propofol. Image quality was good. The patient's vital signs; including heart rate, blood pressure, and oxygen saturation; remained stable throughout the procedure. The patient developed no complications during the procedure. A successful direct current cardioversion was performed at 120 joules with 1 attempt. IMPRESSIONS  1. Left ventricular ejection fraction, by estimation, is 50 to 55%. The left ventricle has low normal function.  2. Right ventricular systolic function is mildly reduced. The right ventricular size is normal.  3. No left atrial/left atrial appendage thrombus was detected. The LAA emptying velocity was 68 cm/s.  4. Right atrial size was mildly dilated.  5. A small pericardial effusion is present.  6. The mitral valve is degenerative. Mild mitral valve regurgitation.  7. Tricuspid valve regurgitation is mild to moderate.  8. The aortic valve is normal in structure. Aortic valve regurgitation is not visualized. No aortic stenosis is present.  9. There is mild (Grade II) atheroma plaque involving the transverse and descending aorta. 10. One late bubble from  agitated saline exam seen, suggestive of tiny intrapulmonary shunt. Conclusion(s)/Recommendation(s): No LA/LAA thrombus identified. Successful cardioversion performed with restoration of normal sinus rhythm. FINDINGS  Left Ventricle: Left  ventricular ejection fraction, by estimation, is 50 to 55%. The left ventricle has low normal function. The left ventricular internal cavity size was normal in size. Right Ventricle: The right ventricular size is normal. No increase in right ventricular wall thickness. Right ventricular systolic function is mildly reduced. Left Atrium: Left atrial size was normal in size. No left atrial/left atrial appendage thrombus was detected. The LAA emptying velocity was 68 cm/s. Right Atrium: Right atrial size was mildly dilated. Pericardium: A small pericardial effusion is present. Mitral Valve: The mitral valve is degenerative in appearance. Mild mitral valve regurgitation. Tricuspid Valve: The tricuspid valve is normal in structure. Tricuspid valve regurgitation is mild to moderate. Aortic Valve: The aortic valve is normal in structure. Aortic valve regurgitation is not visualized. No aortic stenosis is present. Aortic valve mean gradient measures 2.0 mmHg. Aortic valve peak gradient measures 3.7 mmHg. Aortic valve area, by VTI measures 2.83 cm. Pulmonic Valve: The pulmonic valve was grossly normal. Pulmonic valve regurgitation is trivial. Aorta: The aortic root and ascending aorta are structurally normal, with no evidence of dilitation. There is mild (Grade II) atheroma plaque involving the transverse and descending aorta. IAS/Shunts: No atrial level shunt detected by color flow Doppler. Agitated saline contrast was given intravenously to evaluate for intracardiac shunting. One late bubble from agitated saline exam seen, suggestive of tiny intrapulmonary shunt.  LEFT VENTRICLE PLAX 2D LVOT diam:     2.10 cm LV SV:         42 LV SV Index:   18 LVOT Area:     3.46 cm  AORTIC VALVE AV Area (Vmax):    2.73 cm AV Area (Vmean):   2.45 cm AV Area (VTI):     2.83 cm AV Vmax:           96.70 cm/s AV Vmean:          73.100 cm/s AV VTI:            0.148 m AV Peak Grad:      3.7 mmHg AV Mean Grad:      2.0 mmHg LVOT Vmax:          76.30 cm/s LVOT Vmean:        51.700 cm/s LVOT VTI:          0.121 m LVOT/AV VTI ratio: 0.82  AORTA Ao Root diam: 3.40 cm Ao STJ diam:  2.1 cm Ao Asc diam:  3.20 cm  SHUNTS Systemic VTI:  0.12 m Systemic Diam: 2.10 cm Cherlynn Kaiser MD Electronically signed by Cherlynn Kaiser MD Signature Date/Time: 10/15/2020/7:54:28 AM    Final     Cardiac Studies   Left Cardiac Catheterization 04/12/2020:  Prox LAD lesion is 85% stenosed.  Ost 1st Diag to 1st Diag lesion is 85% stenosed.  Dist LAD lesion is 65% stenosed.  Prox LAD to Mid LAD lesion is 75% stenosed.  Ost Cx to Mid Cx lesion is 80% stenosed.  Ost 1st Mrg to 1st Mrg lesion is 95% stenosed.  Mid Cx to Dist Cx lesion is 65% stenosed.  2nd Mrg lesion is 75% stenosed.  RPDA lesion is 60% stenosed.  1st RPL lesion is 70% stenosed.  Prox RCA to Mid RCA lesion is 30% stenosed.  Previously placed Dist RCA drug eluting stent is widely patent.  Balloon angioplasty was  performed.  LV end diastolic pressure is normal.   1. Severe 2 vessel obstructive CAD 2. Patent stent in the RCA 3. Normal LVEDP  Plan: The severe disease in the left coronary distribution is unchanged from prior. No suitable targets for PCI. Recommend continued medical therapy. If refractory angina would need to consider CABG.  Diagnostic Dominance: Right     _______________  Echocardiogram 10/12/2020: Impressions: 1. Left ventricular ejection fraction, by estimation, is 55 to 60%. The  left ventricle has normal function. The left ventricle demonstrates  regional wall motion abnormalities. Mild basal inferior hypokinesis based  on limited views consistent with prior  TTE. There is severe concentric left ventricular hypertrophy. LV diastolic  function is indeterminant due to atrial fibrilltion.  2. Right ventricular systolic function is normal. The right ventricular  size is normal. Tricuspid regurgitation signal is inadequate for assessing  PA  pressure.  3. The mitral valve is normal in structure. Trivial mitral valve  regurgitation. No evidence of mitral stenosis.  4. The aortic valve is tricuspid. Aortic valve regurgitation is not  visualized. No aortic stenosis is present.  5. The inferior vena cava is normal in size with greater than 50%  respiratory variability, suggesting right atrial pressure of 3 mmHg.   Comparison(s): Compared to prior TTE on 04/12/2020, the patient is now in  Afib with RVR. Otherwise, no significant change.  Patient Profile     87 y.o. male with a history of diffuse three vessel CAD s/p DES to distal RCA in 06/2017 and on medical therapy due to declining CABG, hypertension, hyperlipidemia, type 2 diabetes, obstructive sleep apnea on CPAP, GERD, BPH, and obesity who is being seen for evaluation of chest pain and atrial flutter with RVR.  Assessment & Plan    New Onset Atrial Flutter with RVR - now in sinus rhythm -BP stable post cardioversion, maintaining SR.  - continue metoprolol 50 mg BID, can decrease if symptomatic with bradycardia. Would ambulate with nurse today and ensure no symptoms.  - continue Eliquis 5 mg BID.  Chest Pain History of CAD -medical management of CAD. - Continue aspirin and high-intensity statin. Also on Eliquis for Afib. - Continue beta-blocker as above. - Continue Imdur 60mg  daily.  Hypertension -  BP stable today, metoprolol tartrate 50 mg BID - Continue Imdur as above.  Hyperlipidemia - Lipid panel this admission: Total Cholesterol 113, Triglycerides 73, HDL 41, LDL 57. - LDL goal <70 given CAD. - Continue Crestor 40mg  daily.  Type 2 Diabetes - Management per primary team.  Obstructive Sleep Apnea - Compliant with CPAP.  Elevated TSH - TSH elevated at 8.47 this admission. Free T4 normal. - Management per primary team.  Stable for DC from cardiac standpoint. Cardiology will sign off.  Follow up has been arranged 11/18/20.   For questions or updates,  please contact [place] Please consult [email or web address] for contact info under      Signed, [name], MD  10/15/2020, 7:58 AM

## 2020-10-16 ENCOUNTER — Encounter (HOSPITAL_COMMUNITY): Payer: Self-pay | Admitting: Internal Medicine

## 2020-10-16 NOTE — Discharge Summary (Signed)
Physician Discharge Summary  Ryan Garcia JXB:[id number] DOB: [birth year] DOA: 10/11/2020  PCP: Abner Greenspan, MD  Admit date: 10/11/2020 Discharge date: 10/16/2020  Admitted From: Home Disposition:  Home  Recommendations for Outpatient Follow-up:  1. Follow up with PCP in 1-2 weeks 2. Follow up with Cardiology outpatient.  Home Health: None Equipment/Devices: None  Discharge Condition: Stable Code Status:   Code Status: Prior Diet recommendation:  Diet Order            Diet - low sodium heart healthy           Diet - low sodium heart healthy                  Brief/Interim Summary:  87 year old male with PMH of Hypertension, Hyperlipidemia, Diabetes, CAD s/p DES to distal RCA in 2018 who declined CABG, and Obesity (BMI 34 kg/m2) who presents to the ED with chest pain and Aflutter.  He was evaluated by cardiology and managed with Metoprolol 50 mg BID for rate control and Eliquis 5 mg BID for anticoagulation.  He will continue Aspirin for CAD.  He will follow up with his PCP to manage his other chronic conditions.  [name] was discharged home in stable condition.    Discharge Diagnoses:  Principal Problem:   Rapid atrial fibrillation (HCC) Active Problems:   Primary hypertension   CAD S/P percutaneous coronary angioplasty   Hyperlipidemia associated with type 2 diabetes mellitus (HCC)   GERD (gastroesophageal reflux disease)   Uncontrolled type 2 diabetes mellitus with hyperglycemia (HCC)   Coronary artery disease involving native coronary artery of native heart without angina pectoris   Atrial flutter with rapid ventricular response [facility])   Consults:  Cardiology  Subjective: Patient was discharged in stable condition. Discharge Exam: Vitals:   10/15/20 0725 10/15/20 0830  BP: (!) 141/83   Pulse: (!) 57 92  Resp: 18   Temp: 98.2 F (36.8 C)   SpO2: 100% 97%   General: Pt is alert, awake, not in acute distress Cardiovascular: RRR, S1/S2 +, no rubs, no  gallops Respiratory: CTA bilaterally, no wheezing, no rhonchi Abdominal: Soft, NT, ND, bowel sounds + Extremities: no edema, no cyanosis  Discharge Instructions  Discharge Instructions    Diet - low sodium heart healthy   Complete by: As directed    Diet - low sodium heart healthy   Complete by: As directed    Increase activity slowly   Complete by: As directed    Increase activity slowly   Complete by: As directed      Allergies as of 10/15/2020      Reactions   Loratadine Other (See Comments)   Not effective   Simvastatin Other (See Comments)   Intolerant- caused joint pain      Medication List    TAKE these medications   acetaminophen 325 MG tablet Commonly known as: TYLENOL Take 325-650 mg by mouth every 6 (six) hours as needed for mild pain or headache.   apixaban 5 MG Tabs tablet Commonly known as: ELIQUIS Take 1 tablet (5 mg total) by mouth 2 (two) times daily.   aspirin 81 MG tablet Take 81 mg by mouth daily.   cetirizine 10 MG tablet Commonly known as: ZYRTEC TAKE 1 TABLET EVERY DAY What changed:   when to take this  reasons to take this   CO Q 10 PO Take 200 mg by mouth daily. Notes to patient: You have not had this supplement today.  Droplet Pen Needles 31G X 6 MM Misc Generic drug: Insulin Pen Needle USE ONE TIME DAILY  AT  BEDTIME  WITH  LANTUS   Flonase 50 MCG/ACT nasal spray Generic drug: fluticasone Place 1 spray into both nostrils daily as needed for allergies.   glipiZIDE 10 MG tablet Commonly known as: GLUCOTROL TAKE 1 TABLET TWICE DAILY WITH MEALS What changed: when to take this   isosorbide mononitrate 60 MG 24 hr tablet Commonly known as: IMDUR Take 1 tablet (60 mg total) by mouth daily.   Lantus SoloStar 100 UNIT/ML Solostar Pen Generic drug: insulin glargine INJECT 40 UNITS INTO THE SKIN AT BEDTIME. What changed: how much to take   metFORMIN 1000 MG tablet Commonly known as: GLUCOPHAGE Take 1 tablet (1,000 mg total)  by mouth 2 (two) times daily with a meal.   metoprolol tartrate 50 MG tablet Commonly known as: LOPRESSOR Take 1 tablet (50 mg total) by mouth 2 (two) times daily. What changed:   medication strength  how much to take   nitroGLYCERIN 0.4 MG SL tablet Commonly known as: Nitrostat Place 1 tablet (0.4 mg total) under the tongue every 5 (five) minutes as needed for chest pain.   rosuvastatin 40 MG tablet Commonly known as: CRESTOR Take 1 tablet (40 mg total) by mouth daily. What changed: when to take this   True Metrix Air Glucose Meter w/Device Kit 1 kit by Other route 2 (two) times daily. Check blood sugar twice daily and as directed. Dx E11.65   True Metrix Blood Glucose Test test strip Generic drug: glucose blood CHECK BLOOD SUGAR TWICE DAILY   TRUEplus Lancets 30G Misc Check blood sugar twice daily and as directed. Dx E11.65   Vitamin B12 1000 MCG Tbcr Take 1,000 mcg by mouth daily.       Follow-up Information    Lelon Perla, MD Follow up on 11/18/2020.   Specialty: Cardiology Why: at 2:45 PM  with his nurse practitioner Dr. Jory Sims. Contact information: [address] [place] [address] [phone]              Allergies  Allergen Reactions  . Loratadine Other (See Comments)    Not effective  . Simvastatin Other (See Comments)    Intolerant- caused joint pain    The results of significant diagnostics from this hospitalization (including imaging, microbiology, ancillary and laboratory) are listed below for reference.    Microbiology: Recent Results (from the past 240 hour(s))  SARS CORONAVIRUS 2 (TAT 6-24 HRS) Nasopharyngeal Nasopharyngeal Swab     Status: None   Collection Time: 10/11/20  9:54 PM   Specimen: Nasopharyngeal Swab  Result Value Ref Range Status   SARS Coronavirus 2 NEGATIVE NEGATIVE Final    Comment: (NOTE) SARS-CoV-2 target nucleic acids are NOT DETECTED.  The SARS-CoV-2 RNA is generally detectable in  upper and lower respiratory specimens during the acute phase of infection. Negative results do not preclude SARS-CoV-2 infection, do not rule out co-infections with other pathogens, and should not be used as the sole basis for treatment or other patient management decisions. Negative results must be combined with clinical observations, patient history, and epidemiological information. The expected result is Negative.  Fact Sheet for Patients: SugarRoll.be  Fact Sheet for Healthcare Providers: https://www.woods-mathews.com/  This test is not yet approved or cleared by the [place] FDA and  has been authorized for detection and/or diagnosis of SARS-CoV-2 by FDA under an Emergency Use Authorization (EUA). This EUA will remain  in effect (meaning this test can be used) for the duration of the COVID-19 declaration under Se ction 564(b)(1) of the Act, 21 U.S.C. section 360bbb-3(b)(1), unless the authorization is terminated or revoked sooner.  Performed at [facility], [place] [address] [place] [address], [place], [place] [address]     Procedures/Studies: CT ANGIO CHEST PE W OR WO CONTRAST  Result Date: 10/12/2020 CLINICAL DATA:  Suspected pulmonary embolism. EXAM: CT ANGIOGRAPHY CHEST WITH CONTRAST TECHNIQUE: Multidetector CT imaging of the chest was performed using the standard protocol during bolus administration of intravenous contrast. Multiplanar CT image reconstructions and MIPs were obtained to evaluate the vascular anatomy. CONTRAST:  72m OMNIPAQUE IOHEXOL 350 MG/ML SOLN COMPARISON:  April 14, 2020 FINDINGS: Cardiovascular: There is mild calcification of the aortic arch. Satisfactory opacification of the pulmonary arteries to the segmental level. An area of artifact is suspected overlying a lower lobe branch of the right pulmonary artery (axial CT image 81, CT series number 7). No true intraluminal filling defects are clearly identified. Normal heart  size with moderate to marked severity coronary artery calcification. No pericardial effusion. Mediastinum/Nodes: No enlarged mediastinal, hilar, or axillary lymph nodes. Thyroid gland, trachea, and esophagus demonstrate no significant findings. Lungs/Pleura: Mild atelectasis is seen within the bilateral lung bases. A stable partially calcified lung nodule is seen within the posterolateral aspect of the right lower lobe. There is no evidence of a pleural effusion or pneumothorax. Upper Abdomen: There is a small hiatal hernia. Musculoskeletal: Multilevel degenerative changes are seen throughout the thoracic spine. Review of the MIP images confirms the above findings. IMPRESSION: 1. Area of artifact suspected overlying a lower lobe branch of the right pulmonary artery, without definite evidence of pulmonary embolism. Correlation with follow-up chest CTA or nuclear medicine ventilation/perfusion scan is recommended if pulmonary embolism remains of clinical concern. 2. Evidence of prior granulomatous disease. 3. Small hiatal hernia. 4. Aortic atherosclerosis. Aortic Atherosclerosis (ICD10-I70.0). Electronically Signed   By: TVirgina NorfolkM.D.   On: 10/12/2020 01:52   DG Chest Port 1 View  Result Date: 10/11/2020 CLINICAL DATA:  Chest pain EXAM: PORTABLE CHEST 1 VIEW COMPARISON:  04/14/2020 FINDINGS: Borderline cardiomegaly. No focal airspace disease or effusion. Aortic atherosclerosis. No pneumothorax. IMPRESSION: No active disease. Borderline cardiomegaly. Electronically Signed   By: KDonavan FoilM.D.   On: 10/11/2020 18:21   ECHOCARDIOGRAM COMPLETE  Result Date: 10/12/2020    ECHOCARDIOGRAM REPORT   Patient Name:   Ryan MCMILLENDate of Exam: 10/12/2020 Medical Rec #:  [id number]    Height:       71.5 in Accession #:    [id number]   Weight:       246.2 lb Date of Birth:  [birth year]    BSA:          2.315 m Patient Age:    89years      BP:           146/105 mmHg Patient Gender: M             HR:            129 bpm. Exam Location:  Inpatient Procedure: 2D Echo, Color Doppler and Cardiac Doppler Indications:    Atrial Fibrillation I48.91  History:        Patient has prior history of Echocardiogram examinations, most                 recent 04/12/2020. CAD and Previous Myocardial Infarction; Risk  Factors:Dyslipidemia, Diabetes, Hypertension and Sleep Apnea.  Sonographer:    Darlina Sicilian RDCS Referring Phys: [place]  1. Left ventricular ejection fraction, by estimation, is 55 to 60%. The left ventricle has normal function. The left ventricle demonstrates regional wall motion abnormalities. Mild basal inferior hypokinesis based on limited views consistent with prior TTE. There is severe concentric left ventricular hypertrophy. LV diastolic function is indeterminant due to atrial fibrilltion.  2. Right ventricular systolic function is normal. The right ventricular size is normal. Tricuspid regurgitation signal is inadequate for assessing PA pressure.  3. The mitral valve is normal in structure. Trivial mitral valve regurgitation. No evidence of mitral stenosis.  4. The aortic valve is tricuspid. Aortic valve regurgitation is not visualized. No aortic stenosis is present.  5. The inferior vena cava is normal in size with greater than 50% respiratory variability, suggesting right atrial pressure of 3 mmHg. Comparison(s): Compared to prior TTE on 04/12/2020, the patient is now in Afib with RVR. Otherwise, no significant change. FINDINGS  Left Ventricle: Left ventricular ejection fraction, by estimation, is 55 to 60%. The left ventricle has normal function. The left ventricle demonstrates regional wall motion abnormalities. Mild basal inferior hypokinesis based on limited views consistent with prior TTE. The left ventricular internal cavity size was normal in size. There is severe concentric left ventricular hypertrophy. LV diastolic function is indeterminant due to atrial fibrilltion.  Right Ventricle: The right ventricular size is normal. Right vetricular wall thickness was not well visualized. Right ventricular systolic function is normal. Tricuspid regurgitation signal is inadequate for assessing PA pressure. Left Atrium: Left atrial size was normal in size. Right Atrium: Right atrial size was normal in size. Pericardium: There is no evidence of pericardial effusion. Mitral Valve: The mitral valve is normal in structure. Trivial mitral valve regurgitation. No evidence of mitral valve stenosis. Tricuspid Valve: The tricuspid valve is normal in structure. Tricuspid valve regurgitation is trivial. Aortic Valve: The aortic valve is tricuspid. Aortic valve regurgitation is not visualized. No aortic stenosis is present. Pulmonic Valve: The pulmonic valve was not well visualized. Pulmonic valve regurgitation is trivial. Aorta: The aortic root and ascending aorta are structurally normal, with no evidence of dilitation. Venous: The inferior vena cava is normal in size with greater than 50% respiratory variability, suggesting right atrial pressure of 3 mmHg. IAS/Shunts: No atrial level shunt detected by color flow Doppler.  LEFT VENTRICLE PLAX 2D LVIDd:         4.20 cm LVIDs:         3.20 cm LV PW:         1.30 cm LV IVS:        1.60 cm LVOT diam:     2.00 cm LV SV:         44 LV SV Index:   19 LVOT Area:     3.14 cm  RIGHT VENTRICLE RV S prime:     14.90 cm/s TAPSE (M-mode): 1.8 cm LEFT ATRIUM             Index       RIGHT ATRIUM           Index LA diam:        3.40 cm 1.47 cm/m  RA Area:     18.40 cm LA Vol (A2C):   43.2 ml 18.66 ml/m RA Volume:   42.50 ml  18.36 ml/m LA Vol (A4C):   36.0 ml 15.55 ml/m LA Biplane Vol: 40.9 ml 17.67  ml/m  AORTIC VALVE LVOT Vmax:   85.40 cm/s LVOT Vmean:  55.200 cm/s LVOT VTI:    0.139 m  AORTA Ao Root diam: 3.60 cm MITRAL VALVE MV Area (PHT): 7.00 cm     SHUNTS MV Decel Time: 108 msec     Systemic VTI:  0.14 m MV E velocity: 106.67 cm/s  Systemic Diam: 2.00 cm  Gwyndolyn Kaufman MD Electronically signed by Gwyndolyn Kaufman MD Signature Date/Time: 10/12/2020/11:47:36 AM    Final    ECHO TEE  Result Date: 10/15/2020    TRANSESOPHOGEAL ECHO REPORT   Patient Name:   KEANDRE LINDEN Date of Exam: 10/14/2020 Medical Rec #:  [id number]     Height:       71.5 in Accession #:    [id number]    Weight:       243.9 lb Date of Birth:  Feb 04, 1934     BSA:          2.306 m Patient Age:    11 years      BP:           93/56 mmHg Patient Gender: M             HR:           125 bpm. Exam Location:  Inpatient Procedure: 3D Echo, Transesophageal Echo, Cardiac Doppler, Color Doppler and            Saline Contrast Bubble Study Indications:    I48.4 Atypical atrial flutter  History:        Patient has prior history of Echocardiogram examinations. CAD;                 Risk Factors:Diabetes, Hypertension and Sleep Apnea.  Sonographer:    Darlina Sicilian RDCS Referring Phys: [id number] Margie Billet PROCEDURE: After discussion of the risks and benefits of a TEE, an informed consent was obtained from the patient. TEE procedure time was 18 minutes. The transesophogeal probe was passed without difficulty through the esophogus of the patient. Imaged were obtained with the patient in a left lateral decubitus position. Local oropharyngeal anesthetic was provided with Benzocaine spray. Sedation performed by different physician. The patient was monitored while under deep sedation. Anesthestetic sedation  was provided intravenously by Anesthesiology: 263.26m of Propofol. Image quality was good. The patient's vital signs; including heart rate, blood pressure, and oxygen saturation; remained stable throughout the procedure. The patient developed no complications during the procedure. A successful direct current cardioversion was performed at 120 joules with 1 attempt. IMPRESSIONS  1. Left ventricular ejection fraction, by estimation, is 50 to 55%. The left ventricle has low normal function.  2. Right ventricular  systolic function is mildly reduced. The right ventricular size is normal.  3. No left atrial/left atrial appendage thrombus was detected. The LAA emptying velocity was 68 cm/s.  4. Right atrial size was mildly dilated.  5. A small pericardial effusion is present.  6. The mitral valve is degenerative. Mild mitral valve regurgitation.  7. Tricuspid valve regurgitation is mild to moderate.  8. The aortic valve is normal in structure. Aortic valve regurgitation is not visualized. No aortic stenosis is present.  9. There is mild (Grade II) atheroma plaque involving the transverse and descending aorta. 10. One late bubble from agitated saline exam seen, suggestive of tiny intrapulmonary shunt. Conclusion(s)/Recommendation(s): No LA/LAA thrombus identified. Successful cardioversion performed with restoration of normal sinus rhythm. FINDINGS  Left Ventricle: Left ventricular ejection fraction, by estimation, is 50 to 55%.  The left ventricle has low normal function. The left ventricular internal cavity size was normal in size. Right Ventricle: The right ventricular size is normal. No increase in right ventricular wall thickness. Right ventricular systolic function is mildly reduced. Left Atrium: Left atrial size was normal in size. No left atrial/left atrial appendage thrombus was detected. The LAA emptying velocity was 68 cm/s. Right Atrium: Right atrial size was mildly dilated. Pericardium: A small pericardial effusion is present. Mitral Valve: The mitral valve is degenerative in appearance. Mild mitral valve regurgitation. Tricuspid Valve: The tricuspid valve is normal in structure. Tricuspid valve regurgitation is mild to moderate. Aortic Valve: The aortic valve is normal in structure. Aortic valve regurgitation is not visualized. No aortic stenosis is present. Aortic valve mean gradient measures 2.0 mmHg. Aortic valve peak gradient measures 3.7 mmHg. Aortic valve area, by VTI measures 2.83 cm. Pulmonic Valve: The  pulmonic valve was grossly normal. Pulmonic valve regurgitation is trivial. Aorta: The aortic root and ascending aorta are structurally normal, with no evidence of dilitation. There is mild (Grade II) atheroma plaque involving the transverse and descending aorta. IAS/Shunts: No atrial level shunt detected by color flow Doppler. Agitated saline contrast was given intravenously to evaluate for intracardiac shunting. One late bubble from agitated saline exam seen, suggestive of tiny intrapulmonary shunt.  LEFT VENTRICLE PLAX 2D LVOT diam:     2.10 cm LV SV:         42 LV SV Index:   18 LVOT Area:     3.46 cm  AORTIC VALVE AV Area (Vmax):    2.73 cm AV Area (Vmean):   2.45 cm AV Area (VTI):     2.83 cm AV Vmax:           96.70 cm/s AV Vmean:          73.100 cm/s AV VTI:            0.148 m AV Peak Grad:      3.7 mmHg AV Mean Grad:      2.0 mmHg LVOT Vmax:         76.30 cm/s LVOT Vmean:        51.700 cm/s LVOT VTI:          0.121 m LVOT/AV VTI ratio: 0.82  AORTA Ao Root diam: 3.40 cm Ao STJ diam:  2.1 cm Ao Asc diam:  3.20 cm  SHUNTS Systemic VTI:  0.12 m Systemic Diam: 2.10 cm Cherlynn Kaiser MD Electronically signed by Cherlynn Kaiser MD Signature Date/Time: 10/15/2020/7:54:28 AM    Final      Labs: Basic Metabolic Panel: Recent Labs  Lab 10/11/20 1737 10/12/20 0720 10/12/20 0854 10/13/20 0257 10/14/20 0340 10/15/20 0144  NA 140 139  --  140 134* 140  K 4.5 4.1  --  3.9 4.0 4.1  CL 115* 108  --  109 106 108  CO2 21* 23  --  21* 22 26  GLUCOSE 175* 154*  --  137* 149* 134*  BUN 20 16  --  19 20 27*  CREATININE 1.01 1.02  --  1.06 1.21 1.55*  CALCIUM 7.4* 9.0  --  8.9 8.8* 8.4*  MG  --   --  1.8  --   --   --    Liver Function Tests: Recent Labs  Lab 10/11/20 1737  AST 30  ALT 14  ALKPHOS 54  BILITOT 0.7  PROT 4.9*  ALBUMIN 2.8*   CBC: Recent Labs  Lab 10/11/20 1737  10/12/20 0927 10/13/20 0257  WBC 6.5 6.9 8.0  NEUTROABS 4.2  --   --   HGB 11.8* 14.5 14.4  HCT 35.9* 44.8 43.1   MCV 94.2 93.9 92.9  PLT 156 144* 158   CBG: Recent Labs  Lab 10/14/20 1343 10/14/20 1656 10/14/20 2059 10/15/20 0727 10/15/20 1136  GLUCAP 238* 313* 257* 106* 303*   Urinalysis    Component Value Date/Time   COLORURINE YELLOW 04/14/2020 1950   APPEARANCEUR HAZY (A) 04/14/2020 1950   LABSPEC 1.021 04/14/2020 1950   PHURINE 5.0 04/14/2020 1950   GLUCOSEU 150 (A) 04/14/2020 1950   HGBUR SMALL (A) 04/14/2020 1950   BILIRUBINUR NEGATIVE 04/14/2020 1950   BILIRUBINUR Negative 09/23/2017 1018   KETONESUR NEGATIVE 04/14/2020 1950   PROTEINUR NEGATIVE 04/14/2020 1950   UROBILINOGEN 0.2 09/23/2017 1018   NITRITE NEGATIVE 04/14/2020 1950   LEUKOCYTESUR NEGATIVE 04/14/2020 1950   Microbiology Recent Results (from the past 240 hour(s))  SARS CORONAVIRUS 2 (TAT 6-24 HRS) Nasopharyngeal Nasopharyngeal Swab     Status: None   Collection Time: 10/11/20  9:54 PM   Specimen: Nasopharyngeal Swab  Result Value Ref Range Status   SARS Coronavirus 2 NEGATIVE NEGATIVE Final    Comment: (NOTE) SARS-CoV-2 target nucleic acids are NOT DETECTED.  The SARS-CoV-2 RNA is generally detectable in upper and lower respiratory specimens during the acute phase of infection. Negative results do not preclude SARS-CoV-2 infection, do not rule out co-infections with other pathogens, and should not be used as the sole basis for treatment or other patient management decisions. Negative results must be combined with clinical observations, patient history, and epidemiological information. The expected result is Negative.  Fact Sheet for Patients: SugarRoll.be  Fact Sheet for Healthcare Providers: https://www.woods-mathews.com/  This test is not yet approved or cleared by the [place] FDA and  has been authorized for detection and/or diagnosis of SARS-CoV-2 by FDA under an Emergency Use Authorization (EUA). This EUA will remain  in effect (meaning this test  can be used) for the duration of the COVID-19 declaration under Se ction 564(b)(1) of the Act, 21 U.S.C. section 360bbb-3(b)(1), unless the authorization is terminated or revoked sooner.  Performed at [facility], [place] [address], [place], [place] [address]      Time coordinating discharge: 60 minutes  SIGNED: George Hugh, MD  [facility] 10/16/2020, 5:48 PM  If 7PM-7AM, please contact night-coverage [email or web address]

## 2020-10-17 ENCOUNTER — Telehealth: Payer: Self-pay

## 2020-10-17 NOTE — Anesthesia Postprocedure Evaluation (Signed)
Anesthesia Post Note  Patient: Ryan Garcia  Procedure(s) Performed: TRANSESOPHAGEAL ECHOCARDIOGRAM (TEE) (N/A ) CARDIOVERSION (N/A ) BUBBLE STUDY     Patient location during evaluation: Endoscopy Anesthesia Type: MAC Level of consciousness: awake and alert Pain management: pain level controlled Vital Signs Assessment: post-procedure vital signs reviewed and stable Respiratory status: spontaneous breathing Cardiovascular status: stable Anesthetic complications: no   No complications documented.  Last Vitals:  Vitals:   10/15/20 0725 10/15/20 0830  BP: (!) 141/83   Pulse: (!) 57 92  Resp: 18   Temp: 36.8 C   SpO2: 100% 97%    Last Pain:  Vitals:   10/15/20 0830  TempSrc:   PainSc: 0-No pain                 Nolon Nations

## 2020-10-17 NOTE — Telephone Encounter (Signed)
Transition Care Management Follow-up Telephone Call  Date of discharge and from where: 10/15/2020, [facility]   How have you been since you were released from the hospital? Patient is feeling much better. No complaints at this time.   Any questions or concerns? No  Items Reviewed:  Did the pt receive and understand the discharge instructions provided? Yes   Medications obtained and verified? Yes   Other? No   Any new allergies since your discharge? No   Dietary orders reviewed? Yes  Do you have support at home? Yes   Home Care and Equipment/Supplies: Were home health services ordered? not applicable If so, what is the name of the agency? N/A  Has the agency set up a time to come to the patient's home? not applicable Were any new equipment or medical supplies ordered?  No What is the name of the medical supply agency? N/A Were you able to get the supplies/equipment? not applicable Do you have any questions related to the use of the equipment or supplies? No  Functional Questionnaire: (I = Independent and D = Dependent) ADLs: I  Bathing/Dressing- I  Meal Prep- I  Eating- I  Maintaining continence- I  Transferring/Ambulation- I  Managing Meds- I  Follow up appointments reviewed:   PCP Hospital f/u appt confirmed? Yes  Scheduled to see Dr. Glori Bickers on 10/26/2020 @ 10 am.  [facility] f/u appt confirmed? Yes  Scheduled to see Jory Sims, NP on 11/18/2020 @ 2:45 pm.  Are transportation arrangements needed? No   If their condition worsens, is the pt aware to call PCP or go to the Emergency Dept.? Yes  Was the patient provided with contact information for the PCP's office or ED? Yes  Was to pt encouraged to call back with questions or concerns? Yes

## 2020-10-20 NOTE — Chronic Care Management (AMB) (Addendum)
Attempted to call Ryan Garcia to see if he received Lantus paperwork for PAP. Unable to reach patient to discuss. [name] would like patient to complete in office during PCP visit on 4/27 at 10:00am.  Avel Sensor, [place] Assistant [phone]  Total time spent for April 2022 CPA: 10 mins

## 2020-10-24 ENCOUNTER — Other Ambulatory Visit: Payer: Self-pay

## 2020-10-24 ENCOUNTER — Ambulatory Visit
Admission: RE | Admit: 2020-10-24 | Discharge: 2020-10-24 | Disposition: A | Discharge status: Home / Self Care | Payer: Medicare HMO | Source: Ambulatory Visit | Attending: Urology | Admitting: Urology

## 2020-10-24 DIAGNOSIS — M4186 Other forms of scoliosis, lumbar region: Secondary | ICD-10-CM | POA: Diagnosis not present

## 2020-10-24 DIAGNOSIS — N2889 Other specified disorders of kidney and ureter: Secondary | ICD-10-CM | POA: Diagnosis not present

## 2020-10-24 DIAGNOSIS — K8020 Calculus of gallbladder without cholecystitis without obstruction: Secondary | ICD-10-CM | POA: Diagnosis not present

## 2020-10-24 DIAGNOSIS — K7689 Other specified diseases of liver: Secondary | ICD-10-CM | POA: Diagnosis not present

## 2020-10-24 IMAGING — MR MR ABDOMEN WO/W CM
18 series · 48 of 48 positions shown · IV contrast (10ml Gadavist)
Comparison: Multiple exams, including [DATE]

CLINICAL DATA: Renal insufficiency.  Right renal mass.

EXAM:
MRI ABDOMEN WITHOUT AND WITH CONTRAST
TECHNIQUE: Multiplanar multisequence MR imaging of the abdomen was performed
both before and after the administration of intravenous contrast.
CONTRAST:  10mL GADAVIST GADOBUTROL 1 MMOL/ML IV SOLN

[Series 4: T2 fat-sat · axial · 6.0mm · 1.25mm/px · z∈[-177,+96]mm · 2 of 39 slices shown]
[im 1/39]
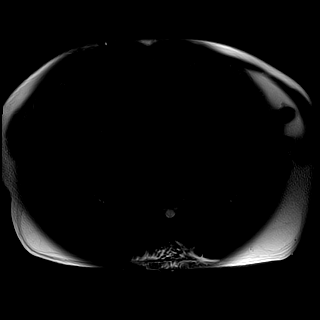
[im 39/39]
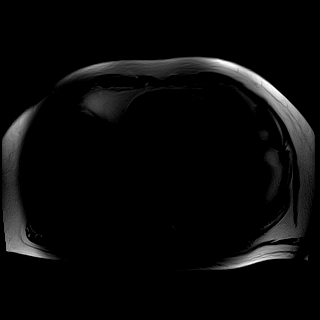

[Series 6: DWI · axial · 6.0mm · 1.49mm/px · z∈[-177,+96]mm · 5 of 117 slices shown (1 of 2)]
[im 1/117]
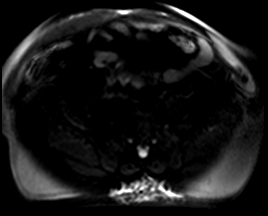
[im 30/117]
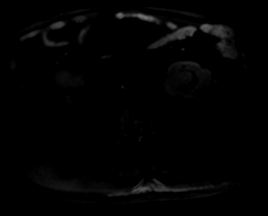
[im 59/117]
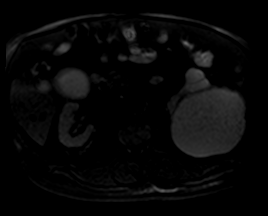
[im 88/117]
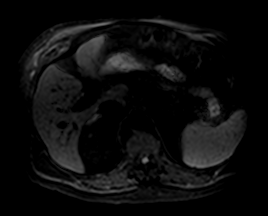
[im 117/117]
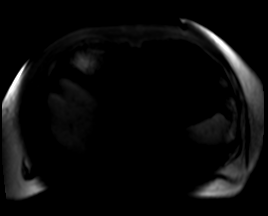

[Series 7: DWI · axial · 6.0mm · 1.49mm/px · z∈[-177,+96]mm · 2 of 39 slices shown (2 of 2)]
[im 1/39]
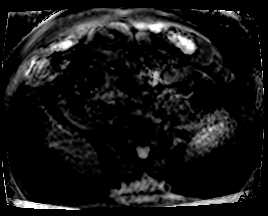
[im 39/39]
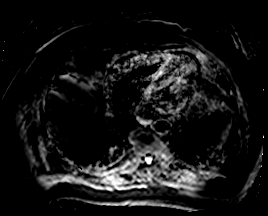

[Series 8: ax in & · axial · 3.0mm · 1.25mm/px · z∈[-183,+102]mm · 3 of 96 slices shown (1 of 2)]
[im 1/96]
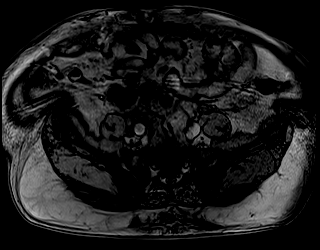
[im 48/96]
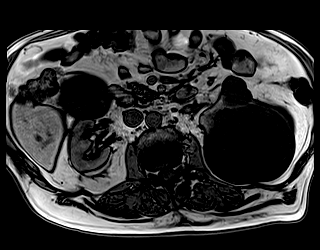
[im 96/96]
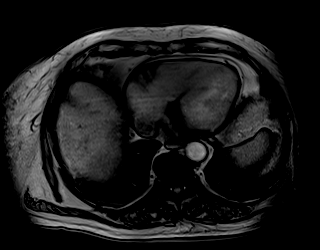

[Series 9: bSSFP · axial · 6.0mm · 0.78mm/px · 1 of 39 slices shown]
[im 1/39]
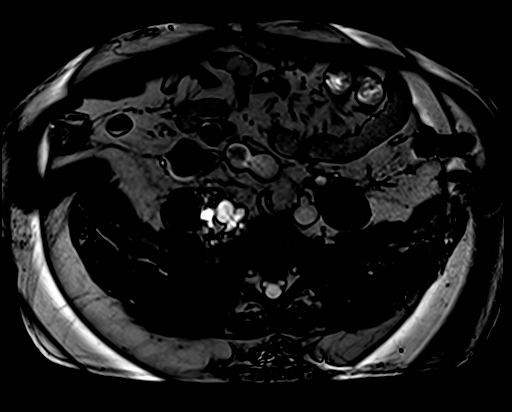

[Series 10: T1 dynamic · axial · non-contrast · 3.0mm · 1.25mm/px · z∈[-183,+102]mm · 3 of 96 slices shown (1 of 9)]
[im 1/96]
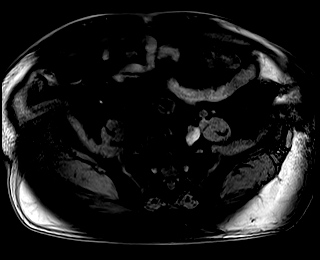
[im 48/96]
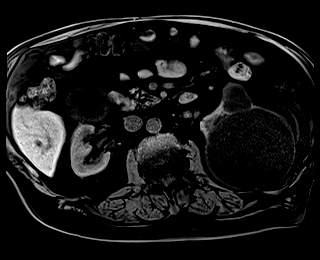
[im 96/96]
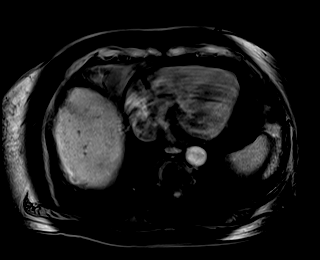

[Series 11: T1 dynamic · axial · 3.0mm · 1.25mm/px · z∈[-183,+102]mm · 3 of 96 slices shown (2 of 9)]
[im 1/96]
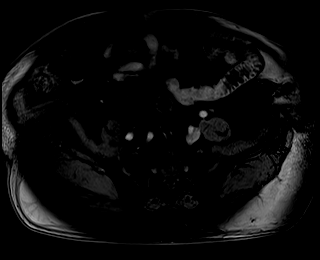
[im 48/96]
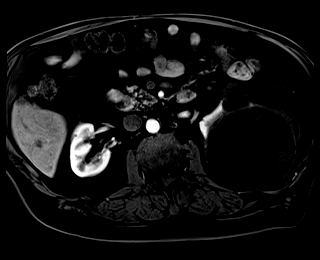
[im 96/96]
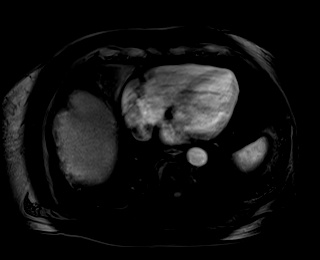

[Series 12: T1 dynamic · axial · 3.0mm · 1.25mm/px · z∈[-183,+102]mm · 3 of 96 slices shown (3 of 9)]
[im 1/96]
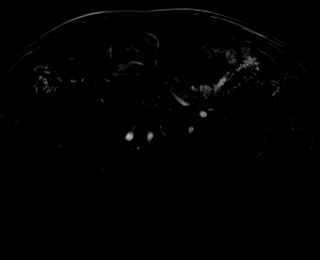
[im 48/96]
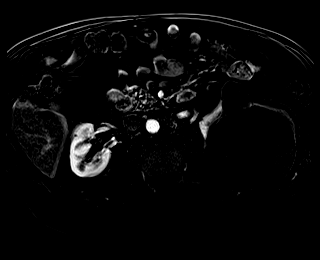
[im 96/96]
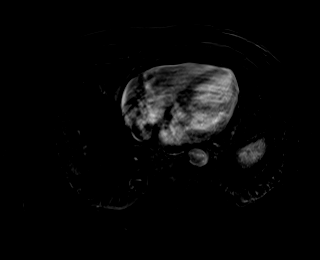

[Series 13: T1 dynamic · axial · 3.0mm · 1.25mm/px · z∈[-183,+102]mm · 3 of 96 slices shown (4 of 9)]
[im 1/96]
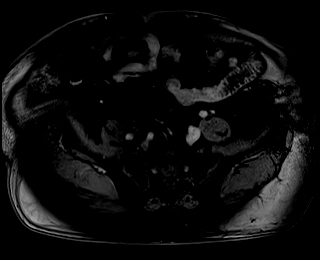
[im 48/96]
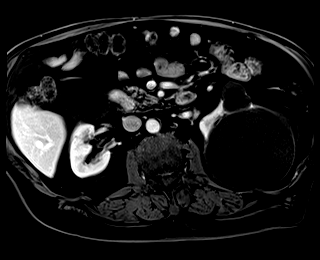
[im 96/96]
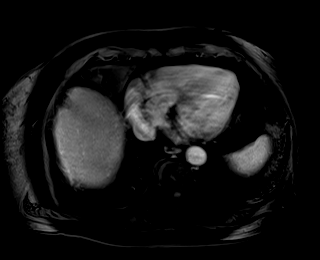

[Series 14: T1 dynamic · axial · 3.0mm · 1.25mm/px · z∈[-183,+102]mm · 3 of 96 slices shown (5 of 9)]
[im 1/96]
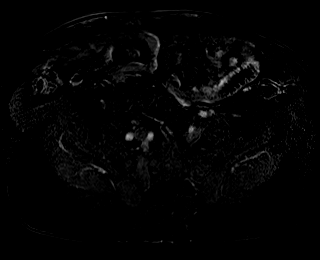
[im 48/96]
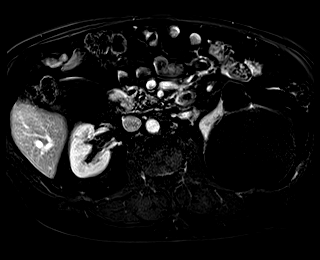
[im 96/96]
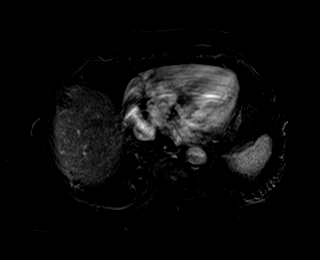

[Series 15: T1 dynamic · axial · 3.0mm · 1.25mm/px · z∈[-183,+102]mm · 3 of 96 slices shown (6 of 9)]
[im 1/96]
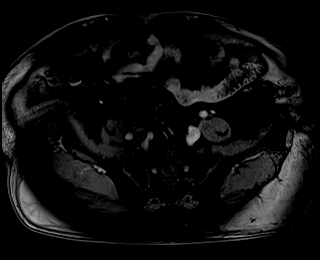
[im 48/96]
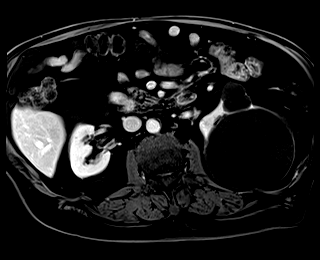
[im 96/96]
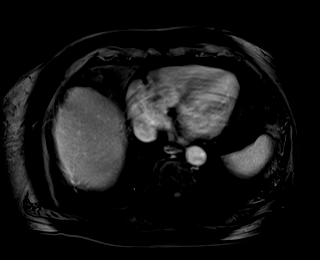

[Series 16: T1 dynamic · axial · 3.0mm · 1.25mm/px · z∈[-183,+102]mm · 3 of 96 slices shown (7 of 9)]
[im 1/96]
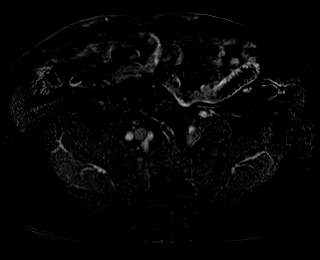
[im 48/96]
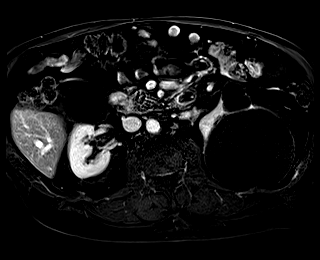
[im 96/96]
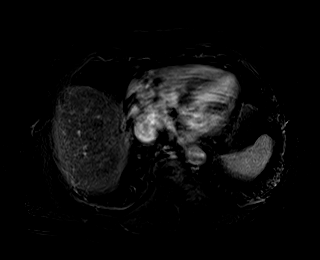

[Series 18: T1 dynamic · axial · 3.0mm · 1.25mm/px · z∈[-183,+102]mm · 3 of 96 slices shown (8 of 9)]
[im 1/96]
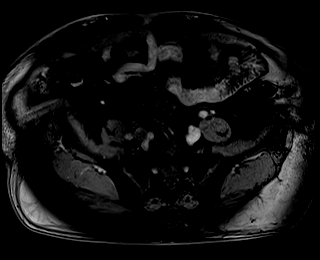
[im 48/96]
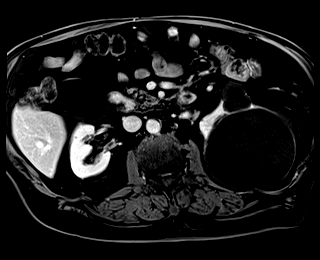
[im 96/96]
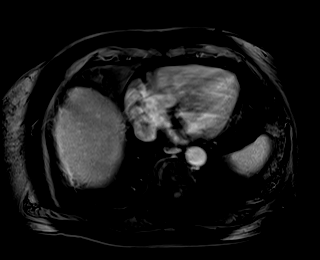

[Series 19: T1 dynamic · axial · 3.0mm · 1.25mm/px · z∈[-183,+102]mm · 3 of 96 slices shown (9 of 9)]
[im 1/96]
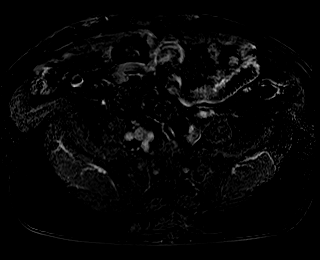
[im 48/96]
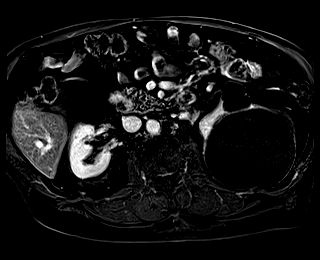
[im 96/96]
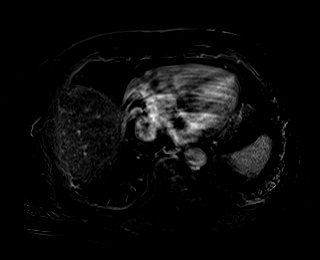

[Series 21: T1 dynamic post-contrast · coronal · 3.0mm · 1.34mm/px · 3 of 80 slices shown]
[im 1/80]
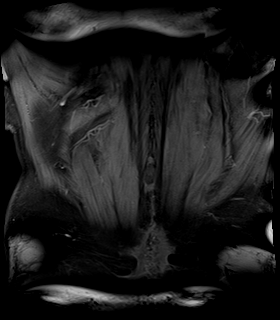
[im 40/80]
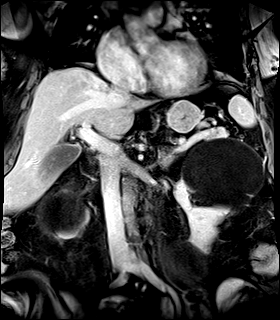
[im 80/80]
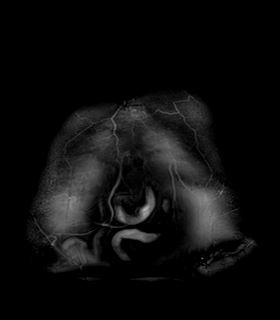

[Series 22: cor haste · coronal · 6.0mm · 1.34mm/px · 1 of 37 slices shown]
[im 1/37]
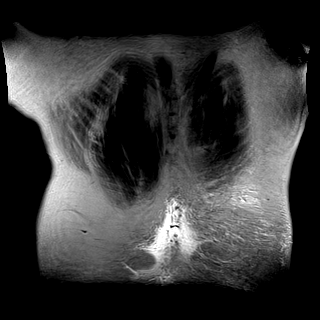

[Series 23: T2 · axial · 6.0mm · 1.25mm/px · 1 of 39 slices shown]
[im 1/39]
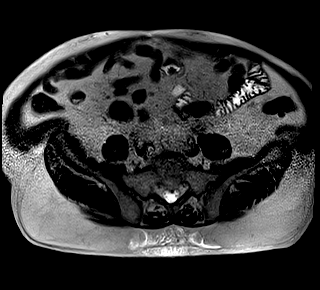

[Series 1024: ax in & · axial · 3.0mm · 1.25mm/px · z∈[-183,+102]mm · 3 of 96 slices shown (2 of 2)]
[im 1/96]
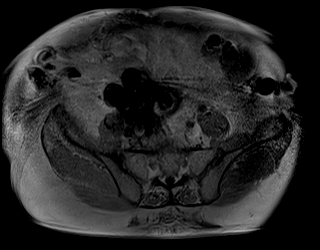
[im 48/96]
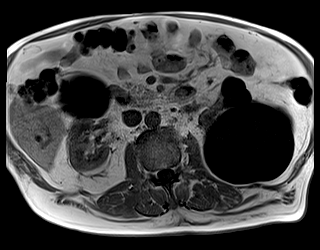
[im 96/96]
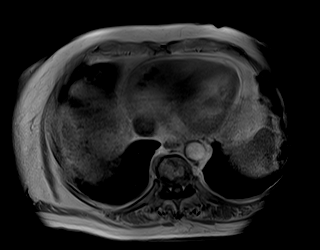

[48 of 48 positions shown; findings below may reference images not displayed]

FINDINGS: Despite efforts by the technologist and patient, motion artifact is
present on today's exam and could not be eliminated. This reduces
exam sensitivity and specificity.

Lower chest: Nodule posteriorly in the right lower lobe likely
corresponding to the calcified lesion on recent CT.

Hepatobiliary: Small gallstones in the gallbladder. No biliary
dilatation. Peripheral 1.0 by 1.3 cm vascular malformation versus
hemangioma in segment 6 of the liver on image 58 series 5. Tiny cyst
along the anterior capsular margin of the liver on image 11 series
18.

Pancreas:  Unremarkable

Spleen:  Unremarkable

Adrenals/Urinary Tract:  Both adrenal glands appear normal.

Bilateral Bosniak category 1 and category 2 renal cysts are present.

The heterogeneously enhancing right kidney upper pole mass measures
2.7 by 2.1 by 2.2 cm (volume = 6.5 cm^3) and has heterogeneously
high T2 signal. By my measurements, this mass was previously 2.7 by
2.1 by 2.3 cm, essentially unchanged from [DATE]. The appearance
is most compatible with a renal cell carcinoma.

Stomach/Bowel: Scattered diverticula in the descending colon.

Vascular/Lymphatic: No pathologic adenopathy. Aortoiliac
atherosclerotic vascular disease. No tumor thrombus is identified in
the right renal vein.

Other:  No supplemental non-categorized findings.

Musculoskeletal: Dextroconvex lumbar scoliosis with rotary
component.
IMPRESSION: 1. Stable enhancing right renal mass compatible with renal cell
carcinoma. No pathologic adenopathy, tumor thrombus in the right
renal vein, or current visualized metastatic disease.
2. Cholelithiasis.
3. Bilateral Bosniak category 1 and category 2 renal cysts.
4.  Aortic Atherosclerosis ([LT]-[LT]).
5. Dextroconvex lumbar scoliosis with rotary component.

## 2020-10-24 MED ORDER — GADOBUTROL 1 MMOL/ML IV SOLN
10.0000 mL | Freq: Once | INTRAVENOUS | Status: AC | PRN
  Administered 2020-10-24: 10 mL via INTRAVENOUS

## 2020-10-26 ENCOUNTER — Ambulatory Visit: Payer: Medicare HMO | Admitting: Urology

## 2020-10-26 ENCOUNTER — Other Ambulatory Visit: Payer: Self-pay

## 2020-10-26 ENCOUNTER — Encounter: Payer: Self-pay | Admitting: Urology

## 2020-10-26 ENCOUNTER — Encounter: Payer: Self-pay | Admitting: Family Medicine

## 2020-10-26 ENCOUNTER — Ambulatory Visit (INDEPENDENT_AMBULATORY_CARE_PROVIDER_SITE_OTHER): Payer: Medicare HMO | Admitting: Family Medicine

## 2020-10-26 VITALS — BP 164/76 | HR 61 | Ht 71.5 in | Wt 256.4 lb

## 2020-10-26 VITALS — BP 134/64 | HR 67 | Temp 96.9°F | Ht 71.5 in | Wt 258.1 lb

## 2020-10-26 DIAGNOSIS — N401 Enlarged prostate with lower urinary tract symptoms: Secondary | ICD-10-CM | POA: Diagnosis not present

## 2020-10-26 DIAGNOSIS — N179 Acute kidney failure, unspecified: Secondary | ICD-10-CM | POA: Diagnosis not present

## 2020-10-26 DIAGNOSIS — N2889 Other specified disorders of kidney and ureter: Secondary | ICD-10-CM

## 2020-10-26 DIAGNOSIS — I10 Essential (primary) hypertension: Secondary | ICD-10-CM | POA: Diagnosis not present

## 2020-10-26 DIAGNOSIS — I4891 Unspecified atrial fibrillation: Secondary | ICD-10-CM

## 2020-10-26 DIAGNOSIS — E1165 Type 2 diabetes mellitus with hyperglycemia: Secondary | ICD-10-CM

## 2020-10-26 DIAGNOSIS — N138 Other obstructive and reflux uropathy: Secondary | ICD-10-CM | POA: Diagnosis not present

## 2020-10-26 DIAGNOSIS — E1169 Type 2 diabetes mellitus with other specified complication: Secondary | ICD-10-CM | POA: Diagnosis not present

## 2020-10-26 DIAGNOSIS — E785 Hyperlipidemia, unspecified: Secondary | ICD-10-CM | POA: Diagnosis not present

## 2020-10-26 LAB — BASIC METABOLIC PANEL
BUN: 20 mg/dL (ref 6–23)
CO2: 25 mEq/L (ref 19–32)
Calcium: 8.4 mg/dL (ref 8.4–10.5)
Chloride: 108 mEq/L (ref 96–112)
Creatinine, Ser: 1.24 mg/dL (ref 0.40–1.50)
GFR: 53.32 mL/min — ABNORMAL LOW (ref >60.00)
Glucose, Bld: 228 mg/dL — ABNORMAL HIGH (ref 70–99)
Potassium: 4 mEq/L (ref 3.5–5.1)
Sodium: 142 mEq/L (ref 135–145)

## 2020-10-26 NOTE — Progress Notes (Signed)
Subjective:    Patient ID: Ryan Garcia, male    DOB: 07-11-1933, 87 y.o.   MRN: [id number]  This visit occurred during the SARS-CoV-2 public health emergency.  Safety protocols were in place, including screening questions prior to the visit, additional usage of staff PPE, and extensive cleaning of exam room while observing appropriate contact time as indicated for disinfecting solutions.    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  He was evaluated by cardiology and managed with Metoprolol 50 mg BID for rate control and Eliquis 5 mg BID for anticoagulation.  He will continue Aspirin for CAD.  He will follow up with his PCP to manage his other chronic conditions.  Mr. Calender was discharged home in stable condition.  He has cardiology f/u 5/20   Lab Results  Component Value Date   CREATININE 1.55 (H) 10/15/2020   BUN 27 (H) 10/15/2020   NA 140 10/15/2020   K 4.1 10/15/2020   CL 108 10/15/2020   CO2 26 10/15/2020   Lab Results  Component Value Date   ALT 14 10/11/2020   AST 30 10/11/2020   ALKPHOS 54 10/11/2020   BILITOT 0.7 10/11/2020   Lab Results  Component Value Date   WBC 8.0 10/13/2020   HGB 14.4 10/13/2020   HCT 43.1 10/13/2020   MCV 92.9 10/13/2020   PLT 158 10/13/2020   MRI of abd was done for renal insuff and renal mass (slow growning and obs by urology) Result : IMPRESSION: 1. Stable enhancing right renal mass  compatible with renal cell carcinoma. No pathologic adenopathy, tumor thrombus in the right renal vein, or current visualized metastatic disease. 2. Cholelithiasis. 3. Bilateral Bosniak category 1 and category 2 renal cysts. 4.  Aortic Atherosclerosis (ICD10-I70.0). 5. Dextroconvex lumbar scoliosis with rotary component.   HTN  bp is stable today  No cp or palpitations or headaches or edema  No side effects to medicines  BP Readings from Last 3 Encounters:  10/26/20 134/64  10/15/20 (!) 141/83  08/16/20 136/82     Metoprolol 50 mg bid imdur 60 mg daily  Pulse Readings from Last 3 Encounters:  10/26/20 67  10/15/20 92  08/16/20 72     DM2 Lab Results  Component Value Date   HGBA1C 8.0 (H) 10/11/2020   this was up from 7.3 Last visit pt declined an inc of lantus beyone 44 u  Taking glipizide 10 mg bid  Metformin 1000 mg bid   Glucose in 80s-100 in am  200 at night   occ 300  Not eating great   Hyperlipidemia  Lab Results  Component Value Date   CHOL 113 10/12/2020   HDL 41 10/12/2020   LDLCALC 57 10/12/2020   LDLDIRECT 78.5 11/17/2012   TRIG 73 10/12/2020   CHOLHDL 2.8 10/12/2020   Taking crestor 40  mg daily   Patient Active Problem List   Diagnosis Date Noted  . Atrial flutter with rapid ventricular response ([place])   . Rapid atrial fibrillation ([place]) 10/11/2020  . Laceration of skin of scalp 08/16/2020  . Head injury 08/16/2020  . Poor balance 07/26/2020  . Anemia 04/20/2020  . Renal mass, right 04/18/2020  . Fever 04/14/2020  . AKI (acute kidney injury) ([place]) 04/14/2020  . Cholangitis 04/14/2020  . Unstable angina ([place])   . Chest pain 04/11/2020  . Left elbow pain 11/18/2019  . Pain and swelling of right lower leg 06/23/2019  . Coronary artery disease involving native coronary artery of native heart without angina pectoris 01/18/2019  . Medicare annual wellness visit, subsequent 01/16/2019  . Ecchymosis 10/22/2018  . Obesity (BMI 30-39.9)  07/09/2018  . HOH (hard of hearing) 03/31/2018  . Uncontrolled type 2 diabetes mellitus with hyperglycemia ([place]) 10/28/2017  . Subclinical hypothyroidism 10/28/2017  . Scoliosis   . Obstructive sleep apnea on CPAP   . Hx of skin cancer, basal cell   . Hyperlipidemia associated with type 2 diabetes mellitus ([place])   . GERD (gastroesophageal reflux disease)   . Chronic upper back pain   . Arthritis   . Allergy   . NSTEMI (non-ST elevated myocardial infarction) ([place]) 06/18/2017  . Actinic keratoses 04/29/2017  . Positive colorectal cancer screening using Cologuard test 10/31/2016  . BPH (benign prostatic hyperplasia) 09/04/2016  . B12 deficiency 11/22/2015  . Routine general medical examination at a health care facility 08/26/2015  . Abnormal nuclear cardiac imaging test   . Dyspnea on exertion 11/27/2013  . Encounter for examination of normal volunteer in research study 05/29/2013  . Prostate cancer screening 07/15/2012  . CAD S/P percutaneous coronary angioplasty 05/17/2011  . Nonspecific abnormal results of cardiovascular function study 04/30/2011  . Abnormal EKG 04/06/2011  . Right low back pain 01/13/2010  . Insulin dependent diabetes mellitus 10/04/2006  . ERECTILE DYSFUNCTION 10/04/2006  . Primary hypertension 10/04/2006  . Allergic rhinitis 10/04/2006  . OSTEOARTHRITIS 10/04/2006  . Sleep apnea 10/04/2006  . EDEMA 10/04/2006  . SKIN CANCER, HX OF 10/04/2006   Past Medical History:  Diagnosis Date  . AC (acromioclavicular) joint bone spurs    lt ankle  . Allergy    allergic rhinitis  . Anginal pain ([place])   . Arthritis    OA  . BPH (benign prostatic hyperplasia)    microwave tx of prostate  . CAD (coronary artery disease)    a. s/p cath in 2012 showing 70-75% LAD stenosis, 75% D1 stenosis, 75% OM1, 60-70% RCA stenosis, and 80% PDA --> medical therapy pursued b. similar findings by cath in 2016; 06/2017 DES to distal RCA  . Chronic kidney disease   . Chronic upper  back pain   . Diabetes mellitus    type II x8 years  . GERD (gastroesophageal reflux disease)   . HLD (hyperlipidemia)    10/12: TC 208, TG 213, HDL 36, LDL 129  . Hx of skin cancer, basal cell    many skin cancers removed/under constant treatment.  . Hypertension   . NSTEMI (non-ST elevated myocardial infarction) ([place]) 06/18/2017   DES to distal RCA  . Obstructive sleep apnea on CPAP   . Scoliosis    Past Surgical History:  Procedure Laterality Date  . BREAST SURGERY  2009   breast lump removed benign  . BUBBLE STUDY  10/14/2020   Procedure: BUBBLE STUDY;  Surgeon: [name], MD;  Location:  [facility];  Service: Cardiovascular;;  . CARDIAC CATHETERIZATION N/A 03/15/2015   Procedure: Left Heart Cath and Coronary Angiography;  Surgeon: Belva Crome, MD;  Location: [facility];  Service: Cardiovascular;  Laterality: N/A;  . CARDIOVERSION N/A 10/14/2020   Procedure: CARDIOVERSION;  Surgeon: [name], MD;  Location: [facility];  Service: Cardiovascular;  Laterality: N/A;  . CORONARY STENT INTERVENTION N/A 06/18/2017   Procedure: CORONARY STENT INTERVENTION;  Surgeon: Jettie Booze, MD;  Location: [facility];  Service: Cardiovascular;  Laterality: N/A;  RCA  . LEFT HEART CATH AND CORONARY ANGIOGRAPHY N/A 06/18/2017   Procedure: LEFT HEART CATH AND CORONARY ANGIOGRAPHY;  Surgeon: Jettie Booze, MD;  Location: [facility];  Service: Cardiovascular;  Laterality: N/A;  . LEFT HEART CATH AND CORONARY ANGIOGRAPHY N/A 04/12/2020   Procedure: LEFT HEART CATH AND CORONARY ANGIOGRAPHY;  Surgeon: Martinique, Peter M, MD;  Location: [facility];  Service: Cardiovascular;  Laterality: N/A;  . TEE WITHOUT CARDIOVERSION N/A 10/14/2020   Procedure: TRANSESOPHAGEAL ECHOCARDIOGRAM (TEE);  Surgeon: [name], MD;  Location: [facility];  Service: Cardiovascular;  Laterality: N/A;  . ULTRASOUND GUIDANCE FOR VASCULAR ACCESS  06/18/2017    Procedure: Ultrasound Guidance For Vascular Access;  Surgeon: Jettie Booze, MD;  Location: [facility];  Service: Cardiovascular;;  right radial, right femoral   Social History   Tobacco Use  . Smoking status: Never Smoker  . Smokeless tobacco: Never Used  Vaping Use  . Vaping Use: Never used  Substance Use Topics  . Alcohol use: No    Alcohol/week: 0.0 standard drinks  . Drug use: No   Family History  Problem Relation Age of Onset  . Heart attack Brother   . Colon cancer Neg Hx   . Colon polyps Neg Hx   . Stomach cancer Neg Hx   . Esophageal cancer Neg Hx    Allergies  Allergen Reactions  . Loratadine Other (See Comments)    Not effective  . Simvastatin Other (See Comments)    Intolerant- caused joint pain   Current Outpatient Medications on File Prior to Visit  Medication Sig Dispense Refill  . acetaminophen (TYLENOL) 325 MG tablet Take 325-650 mg by mouth every 6 (six) hours as needed for mild pain or headache.    Marland Kitchen apixaban (ELIQUIS) 5 MG TABS tablet Take 1 tablet (5 mg total) by mouth 2 (two) times daily. 60 tablet 12  . aspirin 81 MG tablet Take 81 mg by mouth daily.    . Blood Glucose Monitoring Suppl (TRUE METRIX AIR GLUCOSE METER) W/DEVICE KIT 1 kit by Other route 2 (two) times daily. Check blood sugar twice daily and as directed. Dx E11.65 1 kit 0  . cetirizine (ZYRTEC) 10 MG tablet TAKE 1 TABLET EVERY DAY (Patient taking differently: Take 10 mg by mouth daily as needed for allergies.) 90 tablet 1  . Coenzyme Q10 (CO Q 10 PO) Take 200 mg by mouth daily.    . Cyanocobalamin (VITAMIN B12) 1000 MCG TBCR Take 1,000 mcg by mouth daily. 30 tablet   . DROPLET PEN NEEDLES 31G X 6 MM MISC USE ONE TIME DAILY  AT  BEDTIME  WITH  LANTUS 100 each 0  . fluticasone (FLONASE) 50 MCG/ACT nasal spray Place 1 spray into both nostrils daily as needed for allergies.     Marland Kitchen glipiZIDE (GLUCOTROL) 10 MG tablet TAKE 1 TABLET TWICE DAILY WITH MEALS (Patient taking differently:  Take  10 mg by mouth 2 (two) times daily before a meal.) 180 tablet 1  . isosorbide mononitrate (IMDUR) 60 MG 24 hr tablet Take 1 tablet (60 mg total) by mouth daily. 90 tablet 3  . LANTUS SOLOSTAR 100 UNIT/ML Solostar Pen INJECT 40 UNITS INTO THE SKIN AT BEDTIME. (Patient taking differently: Inject 44 Units into the skin at bedtime.) 15 mL 1  . metFORMIN (GLUCOPHAGE) 1000 MG tablet Take 1 tablet (1,000 mg total) by mouth 2 (two) times daily with a meal. 180 tablet 1  . metoprolol tartrate (LOPRESSOR) 50 MG tablet Take 1 tablet (50 mg total) by mouth 2 (two) times daily. 60 tablet 0  . nitroGLYCERIN (NITROSTAT) 0.4 MG SL tablet Place 1 tablet (0.4 mg total) under the tongue every 5 (five) minutes as needed for chest pain. 25 tablet 3  . rosuvastatin (CRESTOR) 40 MG tablet Take 1 tablet (40 mg total) by mouth daily. (Patient taking differently: Take 40 mg by mouth at bedtime.) 90 tablet 3  . TRUE METRIX BLOOD GLUCOSE TEST test strip CHECK BLOOD SUGAR TWICE DAILY 200 strip 2  . TRUEPLUS LANCETS 30G MISC Check blood sugar twice daily and as directed. Dx E11.65 200 each 1   No current facility-administered medications on file prior to visit.    Review of Systems  Constitutional: Positive for activity change and fatigue. Negative for appetite change, fever and unexpected weight change.  HENT: Negative for congestion, rhinorrhea, sore throat and trouble swallowing.   Eyes: Negative for pain, redness, itching and visual disturbance.  Respiratory: Negative for cough, chest tightness, shortness of breath and wheezing.   Cardiovascular: Positive for leg swelling. Negative for chest pain and palpitations.       Baseline leg swelling   Gastrointestinal: Negative for abdominal pain, blood in stool, constipation, diarrhea and nausea.  Endocrine: Negative for cold intolerance, heat intolerance, polydipsia and polyuria.  Genitourinary: Negative for difficulty urinating, dysuria, frequency and urgency.   Musculoskeletal: Negative for arthralgias, joint swelling and myalgias.  Skin: Negative for pallor and rash.  Neurological: Negative for dizziness, tremors, weakness, light-headedness, numbness and headaches.  Hematological: Negative for adenopathy. Does not bruise/bleed easily.  Psychiatric/Behavioral: Negative for decreased concentration and dysphoric mood. The patient is not nervous/anxious.        Objective:   Physical Exam Constitutional:      General: He is not in acute distress.    Appearance: Normal appearance. He is well-developed. He is obese. He is not ill-appearing.  HENT:     Head: Normocephalic and atraumatic.  Eyes:     Conjunctiva/sclera: Conjunctivae normal.     Pupils: Pupils are equal, round, and reactive to light.  Neck:     Thyroid: No thyromegaly.     Vascular: No carotid bruit or JVD.  Cardiovascular:     Rate and Rhythm: Normal rate and regular rhythm.     Heart sounds: Normal heart sounds. No gallop.      Comments: Reg rhythm  Pulmonary:     Effort: Pulmonary effort is normal. No respiratory distress.     Breath sounds: Normal breath sounds. No wheezing or rales.  Abdominal:     General: Bowel sounds are normal. There is no distension or abdominal bruit.     Palpations: Abdomen is soft. There is no mass.     Tenderness: There is no abdominal tenderness.  Musculoskeletal:     Cervical back: Normal range of motion and neck supple.     Right lower leg: Edema present.  Left lower leg: Edema present.     Comments: One plus pedal edema   Lymphadenopathy:     Cervical: No cervical adenopathy.  Skin:    General: Skin is warm and dry.     Coloration: Skin is not pale.     Findings: No erythema or rash.  Neurological:     Mental Status: He is alert.     Motor: No weakness.     Coordination: Coordination normal.     Deep Tendon Reflexes: Reflexes are normal and symmetric.  Psychiatric:        Mood and Affect: Mood normal.            Assessment & Plan:   Problem List Items Addressed This Visit      Cardiovascular and Mediastinum   Primary hypertension   Rapid atrial fibrillation ([place])    Recent hospitalization and now rate controlled with metoprolol and anti coagulated with eliquis  Reviewed hospital records, lab results and studies in detail  Pt feels sluggish from the beta blocker and will address with cardiology           Endocrine   Hyperlipidemia associated with type 2 diabetes mellitus ([place])    Disc goals for lipids and reasons to control them Rev last labs with pt Rev low sat fat diet in detail LDL is 57 and plan to continue crestor 40 mg daily       Uncontrolled type 2 diabetes mellitus with hyperglycemia ([place]) - Primary    Lab Results  Component Value Date   HGBA1C 8.0 (H) 10/11/2020   This is up  Per pt wife/ he is eating poorly and eating more  May also need to stop metformin if renal labs remain elevated  Plan to refer to endocrinology for help with management  inst to check glucose at different times daily to see when it peaks        Relevant Orders   Ambulatory referral to Endocrinology     Genitourinary   AKI (acute kidney injury) ([place])    New problem noted in hospital (when in for a fib)  Cr up to 1.55 Disc imp of hydration  Re check today  If not back to baseline may need to hold metformin  Adv against use of nsaids       Relevant Orders   Basic metabolic panel (Completed)     Other   Renal mass, right    Continues to follow with urology, has appt today

## 2020-10-26 NOTE — Assessment & Plan Note (Signed)
New problem noted in hospital (when in for a fib)  Cr up to 1.55 Disc imp of hydration  Re check today  If not back to baseline may need to hold metformin  Adv against use of nsaids

## 2020-10-26 NOTE — Assessment & Plan Note (Signed)
Continues to follow with urology, has appt today

## 2020-10-26 NOTE — Assessment & Plan Note (Signed)
Recent hospitalization and now rate controlled with metoprolol and anti coagulated with eliquis  Reviewed hospital records, lab results and studies in detail  Pt feels sluggish from the beta blocker and will address with cardiology

## 2020-10-26 NOTE — Assessment & Plan Note (Signed)
Disc goals for lipids and reasons to control them Rev last labs with pt Rev low sat fat diet in detail LDL is 57 and plan to continue crestor 40 mg daily

## 2020-10-26 NOTE — Assessment & Plan Note (Signed)
Lab Results  Component Value Date   HGBA1C 8.0 (H) 10/11/2020   This is up  Per pt wife/ he is eating poorly and eating more  May also need to stop metformin if renal labs remain elevated  Plan to refer to endocrinology for help with management  inst to check glucose at different times daily to see when it peaks

## 2020-10-26 NOTE — Patient Instructions (Signed)
Labs today for kidney function  Drink more water For swollen ankles, support socks may help   Know that your bleeding risk is high and risky behaviors are not a good idea   a1c is up  I placed a referral to endocrinology to help with diabetes   We may have to stop metformin if kidney numbers are high

## 2020-10-26 NOTE — Progress Notes (Signed)
   10/26/2020 3:52 PM   Ryan Garcia 1933-08-22 [id number]  Reason for visit: Follow up right renal mass, urinary symptoms  HPI: I saw Ryan Garcia and his wife back in clinic for the above issues.  He is a comorbid 87 year old male with extensive cardiac history, who was found to have a 2.6 cm right-sided enhancing renal mass concerning for RCC.  Renal function is normal.  At our last visit we discussed options extensively, and using shared decision making opted for active surveillance.  I personally reviewed and interpreted his MRI 10/24/2020 that shows a stable 2.7 cm enhancing right upper pole renal mass consistent with RCC.  This has not enlarged since last scan of October 2021.  Chest imaging with no evidence of metastatic disease.  He would like to continue active surveillance which is very reasonable.  We discussed the <5% risk of developing metastatic disease or symptoms over the next 10 to 15 years based on large data sets with patients with small renal masses <4 cm.  We will plan to repeat MRI in 9 months, and if stable consider yearly ultrasounds with his age and co-morbidities.  Regarding his urinary symptoms, he has a history of a microwave procedure around 10 years ago with Dr. Eliberto Ivory that initially helped his urinary symptoms, but he has had some recurrence of urinary frequency and urgency.  This is primarily when he gets up quickly from his chair.  He is not particularly bothered by this and is not on any medications at this time.  He is not interested in any further evaluation or treatment for his urinary symptoms at this time.  We did discuss behavioral strategies, and avoiding bladder irritants.  RTC 9 months for MRI for surveillance of right renal mass   [name], MD  [facility] [address], [place], [place] [address] [phone]

## 2020-10-28 ENCOUNTER — Telehealth: Payer: Self-pay

## 2020-10-28 NOTE — Telephone Encounter (Signed)
Spoke with wife, patient needs to schedule his recheck labs from 4/27 per Dr. Marliss Coots orders.  Appt made for 11/02/20 as patient is going out of town for an extended time on 5/5.    FYI to [place] in the lab.

## 2020-11-01 ENCOUNTER — Telehealth: Payer: Self-pay | Admitting: Cardiology

## 2020-11-01 MED ORDER — METOPROLOL TARTRATE 25 MG PO TABS: 25.0000 mg | ORAL_TABLET | Freq: Two times a day (BID) | ORAL | Status: DC

## 2020-11-01 NOTE — Telephone Encounter (Signed)
Patient's wife is returning call. 

## 2020-11-01 NOTE — Telephone Encounter (Signed)
Left message for patient with Dr Creshaw's recommendations.   

## 2020-11-01 NOTE — Telephone Encounter (Signed)
Pt c/o medication issue:  1. Name of Medication:  metoprolol tartrate (LOPRESSOR) 50 MG tablet  2. How are you currently taking this medication (dosage and times per day)?  As written  3. Are you having a reaction (difficulty breathing--STAT)?  Yes   4. What is your medication issue?  Extreme fatigue, foggy headed, not able to do much, can't function.   Patient's wife called and said that the hospital doctors put him on metoprolol tartrate (LOPRESSOR) 50 MG tablet 2 tablets daily and it really put the patient down. Wants to know if the medication could be cut in half

## 2020-11-01 NOTE — Telephone Encounter (Signed)
Called patient wife, gave response from MD.  Will notify nurse.   Thanks!

## 2020-11-01 NOTE — Telephone Encounter (Signed)
Spoke to patient's wife she stated since Metoprolol was increased to 50 mg twice a day husband is complaining of being tired,no energy.He has increased swelling in both lower legs.Message sent to [facility] for advice.

## 2020-11-01 NOTE — Telephone Encounter (Signed)
Decrease metoprolol to 25 mg twice daily and follow symptoms. Kirk Ruths

## 2020-11-02 ENCOUNTER — Other Ambulatory Visit: Payer: Self-pay

## 2020-11-02 ENCOUNTER — Other Ambulatory Visit (INDEPENDENT_AMBULATORY_CARE_PROVIDER_SITE_OTHER): Payer: Medicare HMO

## 2020-11-02 ENCOUNTER — Other Ambulatory Visit: Payer: Self-pay | Admitting: Family Medicine

## 2020-11-02 ENCOUNTER — Telehealth (INDEPENDENT_AMBULATORY_CARE_PROVIDER_SITE_OTHER): Payer: Medicare HMO | Admitting: Family Medicine

## 2020-11-02 DIAGNOSIS — N179 Acute kidney failure, unspecified: Secondary | ICD-10-CM

## 2020-11-02 DIAGNOSIS — I10 Essential (primary) hypertension: Secondary | ICD-10-CM

## 2020-11-02 LAB — BASIC METABOLIC PANEL
BUN: 16 mg/dL (ref 6–23)
CO2: 28 mEq/L (ref 19–32)
Calcium: 9 mg/dL (ref 8.4–10.5)
Chloride: 109 mEq/L (ref 96–112)
Creatinine, Ser: 1.14 mg/dL (ref 0.40–1.50)
GFR: 58.98 mL/min — ABNORMAL LOW (ref >60.00)
Glucose, Bld: 113 mg/dL — ABNORMAL HIGH (ref 70–99)
Potassium: 3.9 mEq/L (ref 3.5–5.1)
Sodium: 144 mEq/L (ref 135–145)

## 2020-11-02 NOTE — Telephone Encounter (Signed)
-----   Message from Cloyd Stagers, RT sent at 11/02/2020  8:08 AM EDT ----- Regarding: Lab Orders for Today Wednesday 5.4.2022 Please place lab orders for today Wednesday 5.4.2022, appt notes state "recheck" Thank you, Dyke Maes RT(R)

## 2020-11-05 ENCOUNTER — Encounter: Payer: Self-pay | Admitting: Family Medicine

## 2020-11-08 ENCOUNTER — Telehealth: Payer: Self-pay

## 2020-11-08 NOTE — Chronic Care Management (AMB) (Addendum)
Chronic Care Management Pharmacy Assistant   Name: Ryan Garcia  MRN: [id number] DOB: 1934/03/08  Reason for Encounter: Disease State- Diabetes and Hypertension   Recent office visits:  10/26/20- Dr. Glori Bickers- PCP- Referral to Endocrinology. BMP ordered.  Recent consult visits:  11/01/20 - Cardiology telephone note - decrease metoprolol to 25 mg twice daily due to intolerance 10/26/20- Dr. Nickolas Madrid- Urology- Ordered MRI abdomen. No Changes. Follow up 9 months.   Hospital visits:  Medication Reconciliation was completed by comparing discharge summary, patient's EMR and Pharmacy list, and upon discussion with patient. Admitted to the hospital on 10/11/20 due to Precordial pain. Discharge date was 10/15/20. Discharged from [place]?Medications Started at [facility] Discharge:?? -None  Medication Changes at Hospital Discharge: -increased metoprolol 50 mg BID  Medications Discontinued at Hospital Discharge: -None  Medications that remain the same after Hospital Discharge:??  -All other medications will remain the same.    Medications: Outpatient Encounter Medications as of 11/08/2020  Medication Sig   acetaminophen (TYLENOL) 325 MG tablet Take 325-650 mg by mouth every 6 (six) hours as needed for mild pain or headache.   apixaban (ELIQUIS) 5 MG TABS tablet Take 1 tablet (5 mg total) by mouth 2 (two) times daily.   aspirin 81 MG tablet Take 81 mg by mouth daily.   Blood Glucose Monitoring Suppl (TRUE METRIX AIR GLUCOSE METER) W/DEVICE KIT 1 kit by Other route 2 (two) times daily. Check blood sugar twice daily and as directed. Dx E11.65   cetirizine (ZYRTEC) 10 MG tablet TAKE 1 TABLET EVERY DAY (Patient taking differently: Take 10 mg by mouth daily as needed for allergies.)   Coenzyme Q10 (CO Q 10 PO) Take 200 mg by mouth daily.   Cyanocobalamin (VITAMIN B12) 1000 MCG TBCR Take 1,000 mcg by mouth daily.   DROPLET PEN NEEDLES 31G X 6 MM MISC USE ONE TIME DAILY  AT   BEDTIME  WITH  LANTUS   fluticasone (FLONASE) 50 MCG/ACT nasal spray Place 1 spray into both nostrils daily as needed for allergies.    glipiZIDE (GLUCOTROL) 10 MG tablet TAKE 1 TABLET TWICE DAILY WITH MEALS (Patient taking differently: Take 10 mg by mouth 2 (two) times daily before a meal.)   isosorbide mononitrate (IMDUR) 60 MG 24 hr tablet Take 1 tablet (60 mg total) by mouth daily.   LANTUS SOLOSTAR 100 UNIT/ML Solostar Pen INJECT 40 UNITS INTO THE SKIN AT BEDTIME. (Patient taking differently: Inject 44 Units into the skin at bedtime.)   metFORMIN (GLUCOPHAGE) 1000 MG tablet Take 1 tablet (1,000 mg total) by mouth 2 (two) times daily with a meal.   metoprolol tartrate (LOPRESSOR) 25 MG tablet Take 1 tablet (25 mg total) by mouth 2 (two) times daily.   nitroGLYCERIN (NITROSTAT) 0.4 MG SL tablet Place 1 tablet (0.4 mg total) under the tongue every 5 (five) minutes as needed for chest pain.   rosuvastatin (CRESTOR) 40 MG tablet Take 1 tablet (40 mg total) by mouth daily. (Patient taking differently: Take 40 mg by mouth at bedtime.)   TRUE METRIX BLOOD GLUCOSE TEST test strip CHECK BLOOD SUGAR TWICE DAILY   TRUEPLUS LANCETS 30G MISC Check blood sugar twice daily and as directed. Dx E11.65   No facility-administered encounter medications on file as of 11/08/2020.     Recent Relevant Labs: Lab Results  Component Value Date/Time   HGBA1C 8.0 (H) 10/11/2020 10:11 PM   HGBA1C 7.3 (A) 07/26/2020 11:47 AM  HGBA1C 7.0 (H) 04/11/2020 06:04 PM   MICROALBUR 3.3 (H) 05/09/2010 08:21 AM   MICROALBUR 1.8 11/21/2007 08:46 AM    Kidney Function Lab Results  Component Value Date/Time   CREATININE 1.14 11/02/2020 10:39 AM   CREATININE 1.24 10/26/2020 10:27 AM   GFR 58.98 (L) 11/02/2020 10:39 AM   GFRNONAA 44 (L) 10/15/2020 01:44 AM   GFRAA >60 07/03/2018 03:04 AM    Diabetes  Current antihyperglycemic regimen:  Metformin 1000 mg - 1 tablet twice daily meals Lantus - 44 units bedtime (44 units 6  days a week and 43 units 1 day - in order for 1 pen to last 1 week) Glipizide 10 mg - 1 tablet twice daily meals   Patient verbally confirms he is taking the above medications as directed. Yes  What recent interventions/DTPs have been made to improve glycemic control:  Referred to endocrinology 10/26/20  Have there been any recent hospitalizations or ED visits since last visit with CPP? Yes  Patient denies hypoglycemic symptoms, including Pale, Sweaty, Shaky, Hungry, Nervous/irritable and Vision changes  Patient denies hyperglycemic symptoms, including blurry vision, excessive thirst, fatigue, polyuria and weakness  How often are you checking your blood sugar? twice daily  What are your blood sugars ranging?  Fasting: 81, 84, 106, 112, 99 Before meals: N/A After meals: 136, 157, 161, 212 Bedtime: N/A  On insulin? Yes How many units:Lantus - 44 units 6 days a week and 43 units 1 day per week at bedtime  During the week, how often does your blood glucose drop below 70? Never  Are you checking your feet daily/regularly? Yes  Adherence Review: Is the patient currently on a STATIN medication? Yes Is the patient currently on ACE/ARB medication? No Does the patient have >5 day gap between last estimated fill dates? No  Star Rating Drugs:  Medication:  Last Fill: Day Supply Rosuvastatin 40 mg 08/26/20 90   Recent Office Vitals: BP Readings from Last 3 Encounters:  10/26/20 (!) 164/76  10/26/20 134/64  10/15/20 (!) 141/83   Pulse Readings from Last 3 Encounters:  10/26/20 61  10/26/20 67  10/15/20 92    Wt Readings from Last 3 Encounters:  10/26/20 256 lb 6.4 oz (116.3 kg)  10/26/20 258 lb 1 oz (117.1 kg)  10/15/20 247 lb 8 oz (112.3 kg)     Kidney Function Lab Results  Component Value Date/Time   CREATININE 1.14 11/02/2020 10:39 AM   CREATININE 1.24 10/26/2020 10:27 AM   GFR 58.98 (L) 11/02/2020 10:39 AM   GFRNONAA 44 (L) 10/15/2020 01:44 AM   GFRAA >60 07/03/2018  03:04 AM    BMP Latest Ref Rng & Units 11/02/2020 10/26/2020 10/15/2020  Glucose 70 - 99 mg/dL 113(H) 228(H) 134(H)  BUN 6 - 23 mg/dL 16 20 27(H)  Creatinine 0.40 - 1.50 mg/dL 1.14 1.24 1.55(H)  BUN/Creat Ratio 10 - 24 - - -  Sodium 135 - 145 mEq/L 144 142 140  Potassium 3.5 - 5.1 mEq/L 3.9 4.0 4.1  Chloride 96 - 112 mEq/L 109 108 108  CO2 19 - 32 mEq/L 28 25 26   Calcium 8.4 - 10.5 mg/dL 9.0 8.4 8.4(L)   Hypertension  Current antihypertensive regimen:  Metoprolol tartate 25 mg - 1/2 tablet BID (takes after breakfast and after supper) Isosorbide Mononitrate 60 mg - 1 daily (takes after breakfast)  Patient verbally confirms he is taking the above medications as directed. Yes  How often are you checking your Blood Pressure? infrequently  He checks  his blood pressure in the morning after taking his medication.  Current home BP readings:   DATE:             BP               PULSE    166/76  60     166/83  61    162/70  71  Wrist or arm cuff: arm and wrist  Caffeine intake: Coffee in the morning Salt intake: Uses moderately  OTC medications including pseudoephedrine or NSAIDs? No  Any readings above 180/120? No If yes any symptoms of hypertensive emergency? patient denies any symptoms of high blood pressure   What recent interventions/DTPs have been made by any provider to improve Blood Pressure control since last CPP Visit:  While in hospital metoprolol increased to 50 mg BID from 25 mg - 1/2 tablet BID, but then decreased it back to 25 mg tablet BID due to patient not tolerating it well.   Any recent hospitalizations or ED visits since last visit with CPP? Yes  What diet changes have been made to improve Blood Pressure Control?  No changes in diet  What exercise is being done to improve your Blood Pressure Control?  States they do a lot of walking.   Adherence Review: Is the patient currently on ACE/ARB medication? No  Contacted [employer] to follow up on patient assistance  application for Lantus. Per representative at [employer] they have not received any portion of the application. Patients wife states that once she read over the application she realized they would not qualify so she discarded the application.   Follow-Up:  Pharmacist Review  Debbora Dus, CPP notified  CPA time: 25 min Margaretmary Dys, [place] [phone]  Reviewed note. Per note patient confirms metoprolol 25 mg - 1/2 BID. Patient should be taking 25 mg 1 tablet BID per cardiology note 11/01/20. He was on higher dose - 50 mg BID for short time after recent hospitalization but did not tolerate. Contacted patient to verify current metoprolol dose and left a voicemail for return call. Also wanted to review any concerns about eligibility for patient assistance program.  CPP Time: 17 min Debbora Dus, PharmD Clinical Pharmacist [facility] [phone]

## 2020-11-16 ENCOUNTER — Other Ambulatory Visit: Payer: Self-pay | Admitting: Family Medicine

## 2020-11-16 NOTE — Telephone Encounter (Signed)
Pharmacy requests refill on: Lantus Solostar 100 unit/mL   LAST REFILL: 08/02/2020 (Q-15 mL, R-1) LAST OV: 10/26/2020 NEXT OV: Not Scheduled  PHARMACY: [facility]

## 2020-11-16 NOTE — Progress Notes (Deleted)
Cardiology Office Note   Date:  11/16/2020   ID:  Ryan Garcia, DOB [birth year], MRN [id number]  PCP:  Abner Greenspan, MD  Cardiologist:  Dr. Stanford Breed  No chief complaint on file.    History of Present Illness: Ryan Garcia is a 87 y.o. male who presents for ongoing assessment and management of diffuse three-vessel coronary artery disease status post distal RCA stent on 06/18/2017 on medical management due to refusal of CABG, hypertension, hyperlipidemia, with other history to include type 2 diabetes, BPH, GERD, obesity, and OSA on CPAP.  Patient was recently admitted to the hospital in April 2022 for chest pain and paroxysmal atrial fibrillation.  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  He started having chest discomfort at lunchtime.  He checked his blood pressure and noticed that his heart rate was 129 bpm he was asymptomatic with this with the exception of the chest discomfort.  Upon review of EKGs with Dr. Nechama Guard, it was found that he was in atrial flutter with a rate of 120 to 130 bpm.  Had a CHA2DS2-VASc score of 5 and therefore anticoagulation was discussed and placed on Eliquis 5 mg twice daily.  He was planned for a TEE with DCCV.  This occurred on 10/14/2020 converting him from atrial flutter to sinus bradycardia with PACs.  He was continued on metoprolol 50 mg twice daily, high intensity statin, aspirin, and Imdur.    Past Medical History:  Diagnosis Date  . AC (acromioclavicular) joint bone spurs    lt ankle  . Allergy    allergic rhinitis  . Anginal pain ([place])   . Arthritis    OA  . BPH (benign prostatic hyperplasia)    microwave tx of prostate  . CAD (coronary artery disease)    a. s/p cath in 2012 showing 70-75% LAD stenosis, 75% D1 stenosis, 75% OM1, 60-70% RCA stenosis, and 80% PDA --> medical therapy pursued b. similar findings by cath in 2016; 06/2017 DES to distal RCA  . Chronic kidney disease   . Chronic upper back pain   . Diabetes mellitus     type II x8 years  . GERD (gastroesophageal reflux disease)   . HLD (hyperlipidemia)    10/12: TC 208, TG 213, HDL 36, LDL 129  . Hx of skin cancer, basal cell    many skin cancers removed/under constant treatment.  . Hypertension   . NSTEMI (non-ST elevated myocardial infarction) ([place]) 06/18/2017   DES to distal RCA  . Obstructive sleep apnea on CPAP   . Scoliosis     Past Surgical History:  Procedure Laterality Date  . BREAST SURGERY  2009   breast lump removed benign  . BUBBLE STUDY  10/14/2020   Procedure: BUBBLE STUDY;  Surgeon: [name], MD;  Location: [place];  Service: Cardiovascular;;  . CARDIAC CATHETERIZATION N/A 03/15/2015   Procedure: Left Heart Cath and Coronary Angiography;  Surgeon: Belva Crome, MD;  Location: [place] [facility];  Service: Cardiovascular;  Laterality: N/A;  . CARDIOVERSION N/A 10/14/2020   Procedure: CARDIOVERSION;  Surgeon: [name], MD;  Location: [facility];  Service: Cardiovascular;  Laterality: N/A;  . CORONARY STENT INTERVENTION N/A 06/18/2017   Procedure: CORONARY STENT INTERVENTION;  Surgeon: Jettie Booze, MD;  Location: [place] [facility];  Service: Cardiovascular;  Laterality: N/A;  RCA  . LEFT HEART CATH AND CORONARY ANGIOGRAPHY N/A 06/18/2017   Procedure: LEFT HEART CATH AND CORONARY ANGIOGRAPHY;  Surgeon:  Jettie Booze, MD;  Location: [place] [facility];  Service: Cardiovascular;  Laterality: N/A;  . LEFT HEART CATH AND CORONARY ANGIOGRAPHY N/A 04/12/2020   Procedure: LEFT HEART CATH AND CORONARY ANGIOGRAPHY;  Surgeon: Martinique, Peter M, MD;  Location: [place] [facility];  Service: Cardiovascular;  Laterality: N/A;  . TEE WITHOUT CARDIOVERSION N/A 10/14/2020   Procedure: TRANSESOPHAGEAL ECHOCARDIOGRAM (TEE);  Surgeon: [name], MD;  Location: [place];  Service: Cardiovascular;  Laterality: N/A;  . ULTRASOUND GUIDANCE FOR VASCULAR ACCESS  06/18/2017   Procedure: Ultrasound Guidance For  Vascular Access;  Surgeon: Jettie Booze, MD;  Location: [place] [facility];  Service: Cardiovascular;;  right radial, right femoral     Current Outpatient Medications  Medication Sig Dispense Refill  . acetaminophen (TYLENOL) 325 MG tablet Take 325-650 mg by mouth every 6 (six) hours as needed for mild pain or headache.    Marland Kitchen apixaban (ELIQUIS) 5 MG TABS tablet Take 1 tablet (5 mg total) by mouth 2 (two) times daily. 60 tablet 12  . aspirin 81 MG tablet Take 81 mg by mouth daily.    . Blood Glucose Monitoring Suppl (TRUE METRIX AIR GLUCOSE METER) W/DEVICE KIT 1 kit by Other route 2 (two) times daily. Check blood sugar twice daily and as directed. Dx E11.65 1 kit 0  . cetirizine (ZYRTEC) 10 MG tablet TAKE 1 TABLET EVERY DAY (Patient taking differently: Take 10 mg by mouth daily as needed for allergies.) 90 tablet 1  . Coenzyme Q10 (CO Q 10 PO) Take 200 mg by mouth daily.    . Cyanocobalamin (VITAMIN B12) 1000 MCG TBCR Take 1,000 mcg by mouth daily. 30 tablet   . DROPLET PEN NEEDLES 31G X 6 MM MISC USE ONE TIME DAILY  AT  BEDTIME  WITH  LANTUS 100 each 0  . fluticasone (FLONASE) 50 MCG/ACT nasal spray Place 1 spray into both nostrils daily as needed for allergies.     Marland Kitchen glipiZIDE (GLUCOTROL) 10 MG tablet TAKE 1 TABLET TWICE DAILY WITH MEALS (Patient taking differently: Take 10 mg by mouth 2 (two) times daily before a meal.) 180 tablet 1  . isosorbide mononitrate (IMDUR) 60 MG 24 hr tablet Take 1 tablet (60 mg total) by mouth daily. 90 tablet 3  . LANTUS SOLOSTAR 100 UNIT/ML Solostar Pen INJECT 40 UNITS INTO THE SKIN AT BEDTIME. 30 mL 0  . metFORMIN (GLUCOPHAGE) 1000 MG tablet Take 1 tablet (1,000 mg total) by mouth 2 (two) times daily with a meal. 180 tablet 1  . metoprolol tartrate (LOPRESSOR) 25 MG tablet Take 1 tablet (25 mg total) by mouth 2 (two) times daily.    . nitroGLYCERIN (NITROSTAT) 0.4 MG SL tablet Place 1 tablet (0.4 mg total) under the tongue every 5 (five) minutes as needed  for chest pain. 25 tablet 3  . rosuvastatin (CRESTOR) 40 MG tablet Take 1 tablet (40 mg total) by mouth daily. (Patient taking differently: Take 40 mg by mouth at bedtime.) 90 tablet 3  . TRUE METRIX BLOOD GLUCOSE TEST test strip CHECK BLOOD SUGAR TWICE DAILY 200 strip 2  . TRUEPLUS LANCETS 30G MISC Check blood sugar twice daily and as directed. Dx E11.65 200 each 1   No current facility-administered medications for this visit.    Allergies:   Loratadine and Simvastatin    Social History:  The patient  reports that he has never smoked. He has never used smokeless tobacco. He reports that he does not drink alcohol and  does not use drugs.   Family History:  The patient's family history includes Heart attack in his brother.    ROS: All other systems are reviewed and negative. Unless otherwise mentioned in H&P    PHYSICAL EXAM: VS:  There were no vitals taken for this visit. , BMI There is no height or weight on file to calculate BMI. GEN: Well nourished, well developed, in no acute distress HEENT: normal Neck: no JVD, carotid bruits, or masses Cardiac: ***RRR; no murmurs, rubs, or gallops,no edema  Respiratory:  Clear to auscultation bilaterally, normal work of breathing GI: soft, nontender, nondistended, + BS MS: no deformity or atrophy Skin: warm and dry, no rash Neuro:  Strength and sensation are intact Psych: euthymic mood, full affect   EKG:  EKG {ACTION; IS/IS [id number]} ordered today. The ekg ordered today demonstrates ***   Recent Labs: 10/11/2020: ALT 14 10/12/2020: Magnesium 1.8; TSH 8.470 10/13/2020: Hemoglobin 14.4; Platelets 158 11/02/2020: BUN 16; Creatinine, Ser 1.14; Potassium 3.9; Sodium 144    Lipid Panel    Component Value Date/Time   CHOL 113 10/12/2020 0720   CHOL 126 09/15/2019 0810   TRIG 73 10/12/2020 0720   HDL 41 10/12/2020 0720   HDL 33 (L) 09/15/2019 0810   CHOLHDL 2.8 10/12/2020 0720   VLDL 15 10/12/2020 0720   LDLCALC 57 10/12/2020 0720    LDLCALC 59 09/15/2019 0810   LDLDIRECT 78.5 11/17/2012 0936      Wt Readings from Last 3 Encounters:  10/26/20 256 lb 6.4 oz (116.3 kg)  10/26/20 258 lb 1 oz (117.1 kg)  10/15/20 247 lb 8 oz (112.3 kg)      Other studies Reviewed: Last Left heart catheterization 04/12/2020:   Prox LAD lesion is 85% stenosed.  Ost 1st Diag to 1st Diag lesion is 85% stenosed.  Dist LAD lesion is 65% stenosed.  Prox LAD to Mid LAD lesion is 75% stenosed.  Ost Cx to Mid Cx lesion is 80% stenosed.  Ost 1st Mrg to 1st Mrg lesion is 95% stenosed.  Mid Cx to Dist Cx lesion is 65% stenosed.  2nd Mrg lesion is 75% stenosed.  RPDA lesion is 60% stenosed.  1st RPL lesion is 70% stenosed.  Prox RCA to Mid RCA lesion is 30% stenosed.  Previously placed Dist RCA drug eluting stent is widely patent.  Balloon angioplasty was performed.  LV end diastolic pressure is normal.  1. Severe 2 vessel obstructive CAD 2. Patent stent in the RCA 3. Normal LVEDP  Plan: the severe disease in the left coronary distribution is unchanged from prior. No suitable targets for PCI. Recommend continued medical therapy. If refractory angina would need to consider CABG.  Echo from 10/11/20:  1. Left ventricular ejection fraction, by estimation, is 55 to 60%. The left ventricle has normal function. The left ventricle demonstrates regional wall motion abnormalities. Mild basal inferior hypokinesis based on limited views consistent with prior TTE. There is severe concentric left ventricular hypertrophy. LV diastolic function is indeterminant due to atrial fibrilltion. 2. Right ventricular systolic function is normal. The right ventricular size is normal. Tricuspid regurgitation signal is inadequate for assessing PA pressure. 3. The mitral valve is normal in structure. Trivial mitral valve regurgitation. No evidence of mitral stenosis. 4. The aortic valve is tricuspid. Aortic valve regurgitation is not  visualized. No aortic stenosis is present. 5. The inferior vena cava is normal in size with greater than 50% respiratory variability, suggesting right atrial pressure of 3 mmHg.  Comparison(s): Compared  to prior TTE on 04/12/2020, the patient is now in Afib with RVR. Otherwise, no significant change.     ASSESSMENT AND PLAN:  1.  ***   Current medicines are reviewed at length with the patient today.  I have spent *** dedicated to the care of this patient on the date of this encounter to include pre-visit review of records, assessment, management and diagnostic testing,with shared decision making.  Labs/ tests ordered today include: *** Phill Myron. West Pugh, ANP, AACC   11/16/2020 6:43 PM    [facility] [place] [address] Office [phone] Fax [phone]  Notice: This dictation was prepared with Dragon dictation along with smaller phrase technology. Any transcriptional errors that result from this process are unintentional and may not be corrected upon review.

## 2020-11-18 ENCOUNTER — Ambulatory Visit: Payer: Medicare HMO | Admitting: Adult Health

## 2020-11-18 ENCOUNTER — Telehealth: Payer: Self-pay

## 2020-11-18 NOTE — Telephone Encounter (Signed)
Attempted to contact patient to verify metoprolol dose - see telephone note 11/08/20. Left VM with contact information.  Debbora Dus, PharmD Clinical Pharmacist [facility] [phone]

## 2020-11-21 DIAGNOSIS — D225 Melanocytic nevi of trunk: Secondary | ICD-10-CM | POA: Diagnosis not present

## 2020-11-21 DIAGNOSIS — X32XXXA Exposure to sunlight, initial encounter: Secondary | ICD-10-CM | POA: Diagnosis not present

## 2020-11-21 DIAGNOSIS — Z85828 Personal history of other malignant neoplasm of skin: Secondary | ICD-10-CM | POA: Diagnosis not present

## 2020-11-21 DIAGNOSIS — D2272 Melanocytic nevi of left lower limb, including hip: Secondary | ICD-10-CM | POA: Diagnosis not present

## 2020-11-21 DIAGNOSIS — D2262 Melanocytic nevi of left upper limb, including shoulder: Secondary | ICD-10-CM | POA: Diagnosis not present

## 2020-11-21 DIAGNOSIS — D2261 Melanocytic nevi of right upper limb, including shoulder: Secondary | ICD-10-CM | POA: Diagnosis not present

## 2020-11-21 DIAGNOSIS — L570 Actinic keratosis: Secondary | ICD-10-CM | POA: Diagnosis not present

## 2020-12-02 ENCOUNTER — Telehealth: Payer: Self-pay

## 2020-12-02 ENCOUNTER — Telehealth: Payer: Medicare HMO

## 2020-12-02 NOTE — Chronic Care Management (AMB) (Addendum)
Chronic Care Management Pharmacy Assistant   Name: Ryan Garcia  MRN: [id number] DOB: 08-Jun-1934  Reason for Encounter: Disease State- Hypertension and Diabetes   Recent office visits:  None since last CCM contact  Recent consult visits:  None since last CCM contact  Hospital visits:  10/11/20 Admission to [facility] for Precordial Pain  Medications: Outpatient Encounter Medications as of 12/02/2020  Medication Sig   acetaminophen (TYLENOL) 325 MG tablet Take 325-650 mg by mouth every 6 (six) hours as needed for mild pain or headache.   apixaban (ELIQUIS) 5 MG TABS tablet Take 1 tablet (5 mg total) by mouth 2 (two) times daily.   aspirin 81 MG tablet Take 81 mg by mouth daily.   Blood Glucose Monitoring Suppl (TRUE METRIX AIR GLUCOSE METER) W/DEVICE KIT 1 kit by Other route 2 (two) times daily. Check blood sugar twice daily and as directed. Dx E11.65   cetirizine (ZYRTEC) 10 MG tablet TAKE 1 TABLET EVERY DAY (Patient taking differently: Take 10 mg by mouth daily as needed for allergies.)   Coenzyme Q10 (CO Q 10 PO) Take 200 mg by mouth daily.   Cyanocobalamin (VITAMIN B12) 1000 MCG TBCR Take 1,000 mcg by mouth daily.   DROPLET PEN NEEDLES 31G X 6 MM MISC USE ONE TIME DAILY  AT  BEDTIME  WITH  LANTUS   fluticasone (FLONASE) 50 MCG/ACT nasal spray Place 1 spray into both nostrils daily as needed for allergies.    glipiZIDE (GLUCOTROL) 10 MG tablet TAKE 1 TABLET TWICE DAILY WITH MEALS (Patient taking differently: Take 10 mg by mouth 2 (two) times daily before a meal.)   isosorbide mononitrate (IMDUR) 60 MG 24 hr tablet Take 1 tablet (60 mg total) by mouth daily.   LANTUS SOLOSTAR 100 UNIT/ML Solostar Pen INJECT 40 UNITS INTO THE SKIN AT BEDTIME.   metFORMIN (GLUCOPHAGE) 1000 MG tablet Take 1 tablet (1,000 mg total) by mouth 2 (two) times daily with a meal.   metoprolol tartrate (LOPRESSOR) 25 MG tablet Take 1 tablet (25 mg total) by mouth 2 (two) times daily.   nitroGLYCERIN  (NITROSTAT) 0.4 MG SL tablet Place 1 tablet (0.4 mg total) under the tongue every 5 (five) minutes as needed for chest pain.   rosuvastatin (CRESTOR) 40 MG tablet Take 1 tablet (40 mg total) by mouth daily. (Patient taking differently: Take 40 mg by mouth at bedtime.)   TRUE METRIX BLOOD GLUCOSE TEST test strip CHECK BLOOD SUGAR TWICE DAILY   TRUEPLUS LANCETS 30G MISC Check blood sugar twice daily and as directed. Dx E11.65   No facility-administered encounter medications on file as of 12/02/2020.     Recent Office Vitals: BP Readings from Last 3 Encounters:  10/26/20 (!) 164/76  10/26/20 134/64  10/15/20 (!) 141/83   Pulse Readings from Last 3 Encounters:  10/26/20 61  10/26/20 67  10/15/20 92    Wt Readings from Last 3 Encounters:  10/26/20 256 lb 6.4 oz (116.3 kg)  10/26/20 258 lb 1 oz (117.1 kg)  10/15/20 247 lb 8 oz (112.3 kg)     Kidney Function Lab Results  Component Value Date/Time   CREATININE 1.14 11/02/2020 10:39 AM   CREATININE 1.24 10/26/2020 10:27 AM   GFR 58.98 (L) 11/02/2020 10:39 AM   GFRNONAA 44 (L) 10/15/2020 01:44 AM   GFRAA >60 07/03/2018 03:04 AM    BMP Latest Ref Rng & Units 11/02/2020 10/26/2020 10/15/2020  Glucose 70 - 99 mg/dL 113(H) 228(H) 134(H)  BUN 6 -  23 mg/dL 16 20 27(H)  Creatinine 0.40 - 1.50 mg/dL 1.14 1.24 1.55(H)  BUN/Creat Ratio 10 - 24 - - -  Sodium 135 - 145 mEq/L 144 142 140  Potassium 3.5 - 5.1 mEq/L 3.9 4.0 4.1  Chloride 96 - 112 mEq/L 109 108 108  CO2 19 - 32 mEq/L 28 25 26   Calcium 8.4 - 10.5 mg/dL 9.0 8.4 8.4(L)    Current antihypertensive regimen:  Metoprolol tartate 25 mg - 1 tablet BID (cardiology decreased from 50 mg BID 11/01/20) Isosorbide Mononitrate 60 mg - 1 daily (takes after breakfast)   Patient verbally confirms he is taking the above medications as directed. Yes  How often are you checking your Blood Pressure? 1-2x per week  he checks his blood pressure in the morning before taking his medication.  Current  home BP readings:  DATE:             BP               PULSE  -  179/83  57  -  146/80  57  -  167/77  56  Wrist or arm cuff: Wrist  Caffeine intake: cup of coffee in the morning Salt intake: "uses reasonable amount" OTC medications including pseudoephedrine or NSAIDs? no  Any readings above 180/120? No If yes any symptoms of hypertensive emergency? patient denies any symptoms of high blood pressure  What recent interventions/DTPs have been made by any provider to improve Blood Pressure control since last CPP Visit: None since last outreach  Any recent hospitalizations or ED visits since last visit with CPP? No  What diet changes have been made to improve Blood Pressure Control?  No changes in diet. States he tries to make wise choices.   What exercise is being done to improve your Blood Pressure Control?  Tries to remain active - does a lot of walking  Adherence Review: Is the patient currently on ACE/ARB medication? No Does the patient have >5 day gap between last estimated fill dates? No    Recent Relevant Labs: Lab Results  Component Value Date/Time   HGBA1C 8.0 (H) 10/11/2020 10:11 PM   HGBA1C 7.3 (A) 07/26/2020 11:47 AM   HGBA1C 7.0 (H) 04/11/2020 06:04 PM   MICROALBUR 3.3 (H) 05/09/2010 08:21 AM   MICROALBUR 1.8 11/21/2007 08:46 AM    Kidney Function Lab Results  Component Value Date/Time   CREATININE 1.14 11/02/2020 10:39 AM   CREATININE 1.24 10/26/2020 10:27 AM   GFR 58.98 (L) 11/02/2020 10:39 AM   GFRNONAA 44 (L) 10/15/2020 01:44 AM   GFRAA >60 07/03/2018 03:04 AM     Current antihyperglycemic regimen:  Metformin 1000 mg - 1 tablet twice daily meals Lantus - 44 units bedtime (44 units 6 days a week and 43 units 1 day - rations in order for his pen to last a whole week) Glipizide 10 mg - 1 tablet twice daily with meals   Patient verbally confirms he is taking the above medications as directed. Yes  What recent interventions/DTPs have been made to  improve glycemic control:  No recent interventions  Have there been any recent hospitalizations or ED visits since last visit with CPP? No  Patient denies hypoglycemic symptoms, including Pale, Sweaty, Shaky, Hungry, Nervous/irritable and Vision changes  Patient denies hyperglycemic symptoms, including blurry vision, excessive thirst, fatigue, polyuria and weakness  How often are you checking your blood sugar? twice daily  What are your blood sugars ranging?  Fasting: 122, 127, 123,  117, 122,128, 134, 111 Before meals: N/A After meals: N/A Bedtime: 267, 170, 149, 215, 212, 156, 226  On insulin? Yes How many units: Lantus - 44 units bedtime (44 units 6 days a week and 43 units 1 day - in order for 1 pen to last 1 week)  During the week, how often does your blood glucose drop below 70? Never  Are you checking your feet daily/regularly? Yes  Adherence Review: Is the patient currently on a STATIN medication? Yes Is the patient currently on ACE/ARB medication? No Does the patient have >5 day gap between last estimated fill dates? No  Star Rating Drugs:  Medication:  Last Fill: Day Supply Glipizide 10 mg 08/03/20  90 Metformin 1000 mg 08/18/20 90 Rosuvastatin 40 mg 08/26/20 90 Lisinopril 20 mg 09/09/20 No longer taking   No appointments scheduled within the next 30 days.   Follow-Up:  Pharmacist Review  Debbora Dus, CPP notified  Margaretmary Dys, [place] Assistant [phone]  I have reviewed the care management and care coordination activities outlined in this encounter and I am certifying that I agree with the content of this note. No further action required.  Debbora Dus, PharmD Clinical Pharmacist [facility] at [facility] [phone]

## 2020-12-12 ENCOUNTER — Other Ambulatory Visit: Payer: Self-pay | Admitting: Family Medicine

## 2020-12-22 ENCOUNTER — Other Ambulatory Visit: Payer: Self-pay | Admitting: Family Medicine

## 2020-12-27 ENCOUNTER — Telehealth: Payer: Self-pay | Admitting: Cardiology

## 2020-12-27 MED ORDER — METOPROLOL TARTRATE 25 MG PO TABS: 25.0000 mg | ORAL_TABLET | Freq: Two times a day (BID) | ORAL | Status: DC

## 2020-12-27 NOTE — Telephone Encounter (Signed)
*  STAT* If patient is at the pharmacy, call can be transferred to refill team.   1. Which medications need to be refilled? (please list name of each medication and dose if known) metoprolol tartrate (LOPRESSOR) 25 MG tablet  2. Which pharmacy/location (including street and city if local pharmacy) is medication to be sent to? [facility] [address] [address] [phone]  3. Do they need a 30 day or 90 day supply? 76  Patient is out of town and needs the medicine

## 2020-12-30 ENCOUNTER — Telehealth: Payer: Self-pay | Admitting: Cardiology

## 2020-12-30 ENCOUNTER — Encounter: Payer: Self-pay | Admitting: Cardiology

## 2020-12-30 MED ORDER — METOPROLOL TARTRATE 25 MG PO TABS: 25.0000 mg | ORAL_TABLET | Freq: Two times a day (BID) | ORAL | 1 refills | Status: DC

## 2020-12-30 NOTE — Telephone Encounter (Signed)
Refills has been sent to pharmacy. 

## 2020-12-30 NOTE — Telephone Encounter (Signed)
*  STAT* If patient is at the pharmacy, call can be transferred to refill team.   1. Which medications need to be refilled? (please list name of each medication and dose if known)  new prescription for metoprolol  2. Which pharmacy/location (including street and city if local pharmacy) is medication to be sent to? [facility] [phone]  3. Do they need a 30 day or 90 day supply? 90- Need this today please- he is out of medicine

## 2021-01-24 ENCOUNTER — Telehealth: Payer: Self-pay

## 2021-01-24 NOTE — Chronic Care Management (AMB) (Addendum)
Chronic Care Management Pharmacy Assistant   Name: Ryan Garcia  MRN: [id number] DOB: 10-Mar-1934  Reason for Encounter: Disease State BG log  Recent office visits:  None since last CCM contact  Recent consult visits:  None since last CCM contact  Hospital visits:  None in previous 6 months  Medications: Outpatient Encounter Medications as of 01/24/2021  Medication Sig   acetaminophen (TYLENOL) 325 MG tablet Take 325-650 mg by mouth every 6 (six) hours as needed for mild pain or headache.   apixaban (ELIQUIS) 5 MG TABS tablet Take 1 tablet (5 mg total) by mouth 2 (two) times daily.   aspirin 81 MG tablet Take 81 mg by mouth daily.   Blood Glucose Monitoring Suppl (TRUE METRIX AIR GLUCOSE METER) W/DEVICE KIT 1 kit by Other route 2 (two) times daily. Check blood sugar twice daily and as directed. Dx E11.65   cetirizine (ZYRTEC) 10 MG tablet TAKE 1 TABLET EVERY DAY (Patient taking differently: Take 10 mg by mouth daily as needed for allergies.)   Coenzyme Q10 (CO Q 10 PO) Take 200 mg by mouth daily.   Cyanocobalamin (VITAMIN B12) 1000 MCG TBCR Take 1,000 mcg by mouth daily.   fluticasone (FLONASE) 50 MCG/ACT nasal spray Place 1 spray into both nostrils daily as needed for allergies.    glipiZIDE (GLUCOTROL) 10 MG tablet TAKE 1 TABLET TWICE DAILY WITH MEALS   Insulin Pen Needle (DROPLET PEN NEEDLES) 31G X 6 MM MISC USE ONE TIME DAILY  AT  BEDTIME  WITH  LANTUS   isosorbide mononitrate (IMDUR) 60 MG 24 hr tablet Take 1 tablet (60 mg total) by mouth daily.   LANTUS SOLOSTAR 100 UNIT/ML Solostar Pen INJECT 40 UNITS INTO THE SKIN AT BEDTIME.   metFORMIN (GLUCOPHAGE) 1000 MG tablet Take 1 tablet (1,000 mg total) by mouth 2 (two) times daily with a meal.   metoprolol tartrate (LOPRESSOR) 25 MG tablet Take 1 tablet (25 mg total) by mouth 2 (two) times daily.   nitroGLYCERIN (NITROSTAT) 0.4 MG SL tablet Place 1 tablet (0.4 mg total) under the tongue every 5 (five) minutes as needed for chest  pain.   rosuvastatin (CRESTOR) 40 MG tablet Take 1 tablet (40 mg total) by mouth daily. (Patient taking differently: Take 40 mg by mouth at bedtime.)   TRUE METRIX BLOOD GLUCOSE TEST test strip CHECK BLOOD SUGAR TWICE DAILY   TRUEPLUS LANCETS 30G MISC Check blood sugar twice daily and as directed. Dx E11.65   No facility-administered encounter medications on file as of 01/24/2021.    Recent Office Vitals: BP Readings from Last 3 Encounters:  10/26/20 (!) 164/76  10/26/20 134/64  10/15/20 (!) 141/83   Pulse Readings from Last 3 Encounters:  10/26/20 61  10/26/20 67  10/15/20 92    Wt Readings from Last 3 Encounters:  10/26/20 256 lb 6.4 oz (116.3 kg)  10/26/20 258 lb 1 oz (117.1 kg)  10/15/20 247 lb 8 oz (112.3 kg)     Kidney Function Lab Results  Component Value Date/Time   CREATININE 1.14 11/02/2020 10:39 AM   CREATININE 1.24 10/26/2020 10:27 AM   GFR 58.98 (L) 11/02/2020 10:39 AM   GFRNONAA 44 (L) 10/15/2020 01:44 AM   GFRAA >60 07/03/2018 03:04 AM    BMP Latest Ref Rng & Units 11/02/2020 10/26/2020 10/15/2020  Glucose 70 - 99 mg/dL 113(H) 228(H) 134(H)  BUN 6 - 23 mg/dL 16 20 27(H)  Creatinine 0.40 - 1.50 mg/dL 1.14 1.24 1.55(H)  BUN/Creat Ratio  10 - 24 - - -  Sodium 135 - 145 mEq/L 144 142 140  Potassium 3.5 - 5.1 mEq/L 3.9 4.0 4.1  Chloride 96 - 112 mEq/L 109 108 108  CO2 19 - 32 mEq/L _0 Calcium 8.4 - 10.5 mg/dL 9.0 8.4 8.4(L)        Recent Relevant Labs: Lab Results  Component Value Date/Time   HGBA1C 8.0 (H) 10/11/2020 10:11 PM   HGBA1C 7.3 (A) 07/26/2020 11:47 AM   HGBA1C 7.0 (H) 04/11/2020 06:04 PM   MICROALBUR 3.3 (H) 05/09/2010 08:21 AM   MICROALBUR 1.8 11/21/2007 08:46 AM    Kidney Function Lab Results  Component Value Date/Time   CREATININE 1.14 11/02/2020 10:39 AM   CREATININE 1.24 10/26/2020 10:27 AM   GFR 58.98 (L) 11/02/2020 10:39 AM   GFRNONAA 44 (L) 10/15/2020 01:44 AM   GFRAA >60 07/03/2018 03:04 AM   Attempted contact with  Ryan Garcia 3 times on 01/24/21,01/25/21,01/27/21  Unsuccessful outreach. Will attempt contact next month.   Current antihyperglycemic regimen:  Metformin 1000 mg - 1 tablet twice daily meals Lantus - 44 units bedtime (44 units 6 days a week and 43 units 1 day - rations in order for his pen to last a whole week) Glipizide 10 mg - 1 tablet twice daily with meals   Adherence Review: Is the patient currently on a STATIN medication? Yes Is the patient currently on ACE/ARB medication? No Does the patient have >5 day gap between last estimated fill dates? Yes Unable to reach patient for verification of last refill  Star Rating Drugs:  Medication:  Last Fill: Day Supply Glipizide 5m 10/21/20 90 Rosuvastatin 423m5/18/22 90 Metformin 100023m/17/22 90 Lantus   11/17/20 30   No appointments scheduled within the next 30 days.  MicDebbora DusPP notified  VelAvel SensorCMForest Heightssistant [phone] have reviewed the care management and care coordination activities outlined in this encounter and I am certifying that I agree with the content of this note. No further action required.  MicDebbora DusharmD Clinical Pharmacist [phone]

## 2021-01-30 DIAGNOSIS — E1142 Type 2 diabetes mellitus with diabetic polyneuropathy: Secondary | ICD-10-CM | POA: Diagnosis not present

## 2021-01-30 DIAGNOSIS — E65 Localized adiposity: Secondary | ICD-10-CM | POA: Diagnosis not present

## 2021-01-30 DIAGNOSIS — I2510 Atherosclerotic heart disease of native coronary artery without angina pectoris: Secondary | ICD-10-CM | POA: Diagnosis not present

## 2021-01-30 DIAGNOSIS — Z794 Long term (current) use of insulin: Secondary | ICD-10-CM | POA: Diagnosis not present

## 2021-01-30 DIAGNOSIS — E1159 Type 2 diabetes mellitus with other circulatory complications: Secondary | ICD-10-CM | POA: Diagnosis not present

## 2021-01-30 DIAGNOSIS — T8089XA Other complications following infusion, transfusion and therapeutic injection, initial encounter: Secondary | ICD-10-CM | POA: Diagnosis not present

## 2021-02-02 ENCOUNTER — Other Ambulatory Visit: Payer: Self-pay | Admitting: Cardiology

## 2021-02-10 DIAGNOSIS — G4733 Obstructive sleep apnea (adult) (pediatric): Secondary | ICD-10-CM | POA: Diagnosis not present

## 2021-03-15 ENCOUNTER — Telehealth: Payer: Self-pay

## 2021-03-15 NOTE — Progress Notes (Addendum)
Chronic Care Management Pharmacy Assistant   Name: KENSINGTON DUERST  MRN: [id number] DOB: September 27, 1933   Reason for Encounter: Hypertension Disease State    Recent office visits:  None since last CCM contact  Recent consult visits:  01/30/2021 - Lucilla Lame (Endocrinology) - Patient presented for initial consult - Diabetes. Continue Lantus, metformin and glipizide as prescribed. Add Ozempic. Start 0.25 mg weekly. A voucher was provided. Discussed side effects including nausea and dicusssed strategies to reduce nausea. He was instructed on use and storage of the Ozempic pens. Follow up 3 months.  Hospital visits:  None in previous 6 months  Medications: Outpatient Encounter Medications as of 03/15/2021  Medication Sig   acetaminophen (TYLENOL) 325 MG tablet Take 325-650 mg by mouth every 6 (six) hours as needed for mild pain or headache.   apixaban (ELIQUIS) 5 MG TABS tablet Take 1 tablet (5 mg total) by mouth 2 (two) times daily.   aspirin 81 MG tablet Take 81 mg by mouth daily.   Blood Glucose Monitoring Suppl (TRUE METRIX AIR GLUCOSE METER) W/DEVICE KIT 1 kit by Other route 2 (two) times daily. Check blood sugar twice daily and as directed. Dx E11.65   cetirizine (ZYRTEC) 10 MG tablet TAKE 1 TABLET EVERY DAY (Patient taking differently: Take 10 mg by mouth daily as needed for allergies.)   Coenzyme Q10 (CO Q 10 PO) Take 200 mg by mouth daily.   Cyanocobalamin (VITAMIN B12) 1000 MCG TBCR Take 1,000 mcg by mouth daily.   fluticasone (FLONASE) 50 MCG/ACT nasal spray Place 1 spray into both nostrils daily as needed for allergies.    glipiZIDE (GLUCOTROL) 10 MG tablet TAKE 1 TABLET TWICE DAILY WITH MEALS   Insulin Pen Needle (DROPLET PEN NEEDLES) 31G X 6 MM MISC USE ONE TIME DAILY  AT  BEDTIME  WITH  LANTUS   isosorbide mononitrate (IMDUR) 60 MG 24 hr tablet TAKE 1 TABLET EVERY DAY   LANTUS SOLOSTAR 100 UNIT/ML Solostar Pen INJECT 40 UNITS INTO THE SKIN AT BEDTIME.   metFORMIN (GLUCOPHAGE)  1000 MG tablet Take 1 tablet (1,000 mg total) by mouth 2 (two) times daily with a meal.   metoprolol tartrate (LOPRESSOR) 25 MG tablet Take 1 tablet (25 mg total) by mouth 2 (two) times daily.   nitroGLYCERIN (NITROSTAT) 0.4 MG SL tablet Place 1 tablet (0.4 mg total) under the tongue every 5 (five) minutes as needed for chest pain.   rosuvastatin (CRESTOR) 40 MG tablet Take 1 tablet (40 mg total) by mouth daily. (Patient taking differently: Take 40 mg by mouth at bedtime.)   TRUE METRIX BLOOD GLUCOSE TEST test strip CHECK BLOOD SUGAR TWICE DAILY   TRUEPLUS LANCETS 30G MISC Check blood sugar twice daily and as directed. Dx E11.65   No facility-administered encounter medications on file as of 03/15/2021.    Recent Office Vitals: BP Readings from Last 3 Encounters:  10/26/20 (!) 164/76  10/26/20 134/64  10/15/20 (!) 141/83   Pulse Readings from Last 3 Encounters:  10/26/20 61  10/26/20 67  10/15/20 92    Wt Readings from Last 3 Encounters:  10/26/20 256 lb 6.4 oz (116.3 kg)  10/26/20 258 lb 1 oz (117.1 kg)  10/15/20 247 lb 8 oz (112.3 kg)     Kidney Function Lab Results  Component Value Date/Time   CREATININE 1.14 11/02/2020 10:39 AM   CREATININE 1.24 10/26/2020 10:27 AM   GFR 58.98 (L) 11/02/2020 10:39 AM   GFRNONAA 44 (L) 10/15/2020 01:44 AM  GFRAA >60 07/03/2018 03:04 AM    BMP Latest Ref Rng & Units 11/02/2020 10/26/2020 10/15/2020  Glucose 70 - 99 mg/dL 113(H) 228(H) 134(H)  BUN 6 - 23 mg/dL 16 20 27(H)  Creatinine 0.40 - 1.50 mg/dL 1.14 1.24 1.55(H)  BUN/Creat Ratio 10 - 24 - - -  Sodium 135 - 145 mEq/L 144 142 140  Potassium 3.5 - 5.1 mEq/L 3.9 4.0 4.1  Chloride 96 - 112 mEq/L 109 108 108  CO2 19 - 32 mEq/L 28 25 26   Calcium 8.4 - 10.5 mg/dL 9.0 8.4 8.4(L)    Contacted patient on 03/27/2021 to discuss hypertension disease state  Current antihypertensive regimen:  Metoprolol Tartrate 57m - 1 tablet by mouth two times daily.   Patient verbally confirms he is taking  the above medications as directed. Yes  How often are you checking your Blood Pressure? 3-5x per week  he checks his blood pressure in the morning and in the evening after taking his medication.  Current home BP readings: Patient's wife states she does take his blood pressure, but she does not document it.   Wrist or arm cuff: Arm Caffeine intake: 1 cup of coffee Salt intake: Adds salt lightly  OTC medications including pseudoephedrine or NSAIDs? No  Any readings above 180/120? No  What recent interventions/DTPs have been made by any provider to improve Blood Pressure control since last CPP Visit: None   Any recent hospitalizations or ED visits since last visit with CPP? No  What diet changes have been made to improve Blood Pressure Control?  Patient's wife states he does watch the portion sizes of what he is eating.   What exercise is being done to improve your Blood Pressure Control?  Patient's wife states he does not exercise. They lost their daughter two weeks ago and he is too exhausted and tired to do anything.   Adherence Review: Is the patient currently on ACE/ARB medication? No Does the patient have >5 day gap between last estimated fill dates? No  Star Rating Drugs:  Medication:  Last Fill: Day Supply Rosuvastatin 462m08/17/2022 90 Glipizide 1044m          12/28/2020     90 Metformin 1000m35m   01/03/2021     90 Lantus                         02/06/2021      75  Care Gaps: Annual wellness visit in last year? No 01/16/2019 Most Recent BP reading: 164/76 on 10/26/2020  If Diabetic: Most recent A1C reading: 8.3 on 01/30/2021 - start Ozempic afterwards Last eye exam / retinopathy screening: 07/24/2019 Last diabetic foot exam: 04/29/2018  No appointments scheduled within the next 30 days.  MichDebbora DusP notified  Demarquis Osley Marijean NiemannA [facility] Pharmacy Assistant [phone] reviewed the care management and care coordination activities outlined in  this encounter and I am certifying that I agree with the content of this note. No further action required.  MichDebbora DusarmD Clinical Pharmacist [phone]

## 2021-03-17 ENCOUNTER — Other Ambulatory Visit: Payer: Self-pay | Admitting: Family Medicine

## 2021-03-30 ENCOUNTER — Other Ambulatory Visit: Payer: Self-pay | Admitting: *Deleted

## 2021-03-30 DIAGNOSIS — E1165 Type 2 diabetes mellitus with hyperglycemia: Secondary | ICD-10-CM

## 2021-03-30 MED ORDER — BD SWAB SINGLE USE REGULAR PADS: MEDICATED_PAD | 3 refills | Status: DC

## 2021-03-30 MED ORDER — TRUE METRIX BLOOD GLUCOSE TEST VI STRP: ORAL_STRIP | 3 refills | Status: DC

## 2021-03-30 MED ORDER — TRUEPLUS LANCETS 30G MISC: 3 refills | Status: DC

## 2021-03-30 MED ORDER — TRUE METRIX LEVEL 1 LOW VI SOLN: 3 refills | Status: DC

## 2021-03-30 MED ORDER — TRUE METRIX AIR GLUCOSE METER W/DEVICE KIT: 1.0000 | PACK | Freq: Two times a day (BID) | 0 refills | Status: DC

## 2021-04-06 ENCOUNTER — Telehealth: Payer: Self-pay

## 2021-04-06 NOTE — Chronic Care Management (AMB) (Addendum)
Chronic Care Management Pharmacy Assistant   Name: Ryan Garcia  MRN: [id number] DOB: Nov 16, 1933   Reason for Encounter: Patient assistance follow up for Lantus/Ozempic   Conditions to be addressed/monitored: CAD, HTN, HLD, and DMII  Recent office visits:  None since last CCM contact  Recent consult visits:  01/30/21 - Endocrinology - Start Ozempic, voucher provided. Follow up 3 months.   Hospital visits:  None in previous 6 months  Medications: Outpatient Encounter Medications as of 04/06/2021  Medication Sig   acetaminophen (TYLENOL) 325 MG tablet Take 325-650 mg by mouth every 6 (six) hours as needed for mild pain or headache.   Alcohol Swabs (B-D SINGLE USE SWABS REGULAR) PADS Use to check blood sugar 2 times a day   apixaban (ELIQUIS) 5 MG TABS tablet Take 1 tablet (5 mg total) by mouth 2 (two) times daily.   aspirin 81 MG tablet Take 81 mg by mouth daily.   Blood Glucose Calibration (TRUE METRIX LEVEL 1) Low SOLN Use to check control on glucose meter   Blood Glucose Monitoring Suppl (TRUE METRIX AIR GLUCOSE METER) w/Device KIT 1 kit by Other route 2 (two) times daily. Check blood sugar twice daily and as directed. Dx E11.65   cetirizine (ZYRTEC) 10 MG tablet TAKE 1 TABLET EVERY DAY (Patient taking differently: Take 10 mg by mouth daily as needed for allergies.)   Coenzyme Q10 (CO Q 10 PO) Take 200 mg by mouth daily.   Cyanocobalamin (VITAMIN B12) 1000 MCG TBCR Take 1,000 mcg by mouth daily.   fluticasone (FLONASE) 50 MCG/ACT nasal spray Place 1 spray into both nostrils daily as needed for allergies.    glipiZIDE (GLUCOTROL) 10 MG tablet TAKE 1 TABLET TWICE DAILY WITH MEALS   glucose blood (TRUE METRIX BLOOD GLUCOSE TEST) test strip CHECK BLOOD SUGAR TWICE DAILY   Insulin Pen Needle (DROPLET PEN NEEDLES) 31G X 6 MM MISC USE ONE TIME DAILY  AT  BEDTIME  WITH  LANTUS   isosorbide mononitrate (IMDUR) 60 MG 24 hr tablet TAKE 1 TABLET EVERY DAY   LANTUS SOLOSTAR 100 UNIT/ML  Solostar Pen INJECT 40 UNITS INTO THE SKIN AT BEDTIME.   metFORMIN (GLUCOPHAGE) 1000 MG tablet TAKE 1 TABLET TWICE DAILY WITH MEALS   metoprolol tartrate (LOPRESSOR) 25 MG tablet Take 1 tablet (25 mg total) by mouth 2 (two) times daily.   nitroGLYCERIN (NITROSTAT) 0.4 MG SL tablet Place 1 tablet (0.4 mg total) under the tongue every 5 (five) minutes as needed for chest pain.   rosuvastatin (CRESTOR) 40 MG tablet Take 1 tablet (40 mg total) by mouth daily. (Patient taking differently: Take 40 mg by mouth at bedtime.)   TRUEplus Lancets 30G MISC Check blood sugar twice daily and as directed. Dx E11.65   No facility-administered encounter medications on file as of 04/06/2021.   Patient reports his assistance application for Lantus declined by manufacturer due to income. States the Ozempic is expensive as well and he will probably not take this long due to expense. Reports his blood sugars are less than 200 fasting in the morning and are monitored. The patient takes metformin 1043m twice daily and glipizide 123mtwice daily in addition to Lantus and Ozempic.   MiDebbora DusCPP notified  VeAvel SensorCCMount Oliverssistant [phone] have reviewed the care management and care coordination activities outlined in this encounter and I am certifying that I agree with the content of this note. No further action required. Patient should have  follow up with endocrinology early November for 3 month visit.   Michelle Adams, PharmD Clinical Pharmacist  Primary Care at [place] [phone]  

## 2021-04-17 ENCOUNTER — Other Ambulatory Visit: Payer: Self-pay | Admitting: Family Medicine

## 2021-04-17 ENCOUNTER — Telehealth: Payer: Self-pay | Admitting: Cardiology

## 2021-04-17 NOTE — Telephone Encounter (Signed)
Spoke with patient's wife who states [place] advised patient was prescribed metoprolol 12.5 mg twice daily and wife states she thinks that is an older prescription. I advised that per our records, patient should be taking metoprolol 25 mg twice daily and wife states she agrees. States we may receive a request from [facility] to verify. She thanked me for the call.

## 2021-04-17 NOTE — Telephone Encounter (Signed)
Patient's wife was returning call 

## 2021-04-17 NOTE — Telephone Encounter (Signed)
Left message to call back  OV 07/25/20 Dr. Teodora Medici 12.5 mg BID 10/15/20-Hospital d/c states metoprolol 50 BID 11/01/20-phone note, decreased to 25 mg BID per Dr. Stanford Breed  Current med list: metoprolol 25 BID.

## 2021-04-17 NOTE — Telephone Encounter (Signed)
New Message:     Wife wants to know the correct milligram of Metoprolol patient should be taking and how is he supposed to be taking it?

## 2021-04-20 NOTE — Progress Notes (Addendum)
Contacted [employer] and the application was never turned in by the patient for PAP Lantus. The patient states financially he will not qualify for PAP.  Debbora Dus, CPP notified  Avel Sensor, [place] Assistant [phone]  Total time spent for month CPA: 10 min.

## 2021-04-28 ENCOUNTER — Emergency Department (HOSPITAL_BASED_OUTPATIENT_CLINIC_OR_DEPARTMENT_OTHER)
Admission: EM | Admit: 2021-04-28 | Discharge: 2021-04-28 | Disposition: A | Discharge status: Home / Self Care | Payer: Medicare HMO | Attending: Emergency Medicine | Admitting: Emergency Medicine

## 2021-04-28 ENCOUNTER — Other Ambulatory Visit: Payer: Self-pay

## 2021-04-28 ENCOUNTER — Encounter (HOSPITAL_BASED_OUTPATIENT_CLINIC_OR_DEPARTMENT_OTHER): Payer: Self-pay | Admitting: Emergency Medicine

## 2021-04-28 DIAGNOSIS — N189 Chronic kidney disease, unspecified: Secondary | ICD-10-CM | POA: Diagnosis not present

## 2021-04-28 DIAGNOSIS — Z7982 Long term (current) use of aspirin: Secondary | ICD-10-CM | POA: Insufficient documentation

## 2021-04-28 DIAGNOSIS — I129 Hypertensive chronic kidney disease with stage 1 through stage 4 chronic kidney disease, or unspecified chronic kidney disease: Secondary | ICD-10-CM | POA: Diagnosis not present

## 2021-04-28 DIAGNOSIS — K602 Anal fissure, unspecified: Secondary | ICD-10-CM | POA: Insufficient documentation

## 2021-04-28 DIAGNOSIS — Z7901 Long term (current) use of anticoagulants: Secondary | ICD-10-CM | POA: Diagnosis not present

## 2021-04-28 DIAGNOSIS — K625 Hemorrhage of anus and rectum: Secondary | ICD-10-CM | POA: Insufficient documentation

## 2021-04-28 DIAGNOSIS — I2510 Atherosclerotic heart disease of native coronary artery without angina pectoris: Secondary | ICD-10-CM | POA: Diagnosis not present

## 2021-04-28 DIAGNOSIS — Z85828 Personal history of other malignant neoplasm of skin: Secondary | ICD-10-CM | POA: Insufficient documentation

## 2021-04-28 DIAGNOSIS — E1122 Type 2 diabetes mellitus with diabetic chronic kidney disease: Secondary | ICD-10-CM | POA: Diagnosis not present

## 2021-04-28 DIAGNOSIS — K6289 Other specified diseases of anus and rectum: Secondary | ICD-10-CM | POA: Diagnosis present

## 2021-04-28 DIAGNOSIS — Z794 Long term (current) use of insulin: Secondary | ICD-10-CM | POA: Insufficient documentation

## 2021-04-28 DIAGNOSIS — Z79899 Other long term (current) drug therapy: Secondary | ICD-10-CM | POA: Diagnosis not present

## 2021-04-28 DIAGNOSIS — Z7984 Long term (current) use of oral hypoglycemic drugs: Secondary | ICD-10-CM | POA: Diagnosis not present

## 2021-04-28 LAB — BASIC METABOLIC PANEL
Anion gap: 10 (ref 5–15)
BUN: 13 mg/dL (ref 8–23)
CO2: 25 mmol/L (ref 22–32)
Calcium: 8.9 mg/dL (ref 8.9–10.3)
Chloride: 105 mmol/L (ref 98–111)
Creatinine, Ser: 1.17 mg/dL (ref 0.61–1.24)
GFR, Estimated: >60 mL/min (ref >60)
Glucose, Bld: 252 mg/dL — ABNORMAL HIGH (ref 70–99)
Potassium: 4.1 mmol/L (ref 3.5–5.1)
Sodium: 140 mmol/L (ref 135–145)

## 2021-04-28 LAB — CBC WITH DIFFERENTIAL/PLATELET
Abs Immature Granulocytes: 0.02 10*3/uL (ref 0.00–0.07)
Basophils Absolute: 0 10*3/uL (ref 0.0–0.1)
Basophils Relative: 1 %
Eosinophils Absolute: 0.2 10*3/uL (ref 0.0–0.5)
Eosinophils Relative: 3 %
HCT: 43.6 % (ref 39.0–52.0)
Hemoglobin: 14.5 g/dL (ref 13.0–17.0)
Immature Granulocytes: 0 %
Lymphocytes Relative: 25 %
Lymphs Abs: 1.7 10*3/uL (ref 0.7–4.0)
MCH: 31 pg (ref 26.0–34.0)
MCHC: 33.3 g/dL (ref 30.0–36.0)
MCV: 93.2 fL (ref 80.0–100.0)
Monocytes Absolute: 0.7 10*3/uL (ref 0.1–1.0)
Monocytes Relative: 10 %
Neutro Abs: 4.3 10*3/uL (ref 1.7–7.7)
Neutrophils Relative %: 61 %
Platelets: 160 10*3/uL (ref 150–400)
RBC: 4.68 MIL/uL (ref 4.22–5.81)
RDW: 15.5 % (ref 11.5–15.5)
WBC: 7 10*3/uL (ref 4.0–10.5)
nRBC: 0 % (ref 0.0–0.2)

## 2021-04-28 MED ORDER — HYDROCORTISONE (PERIANAL) 1 % EX CREA: 1.0000 [in_us] | TOPICAL_CREAM | Freq: Two times a day (BID) | CUTANEOUS | 0 refills | Status: DC

## 2021-04-28 NOTE — ED Notes (Signed)
ED Provider at bedside. 

## 2021-04-28 NOTE — ED Triage Notes (Signed)
Pt arrives to ED with c/o of rectal bleeding. Pt reports that he had rectal pain around 2-3 weeks ago. After the pain subsided he developed BRBPR around 1.5 weeks ago. The rectal bleeding is intermittent but worsened last night.

## 2021-04-28 NOTE — ED Notes (Signed)
Chaperone for EDP for rectal exam. Pt tolerated well.  

## 2021-04-28 NOTE — ED Provider Notes (Signed)
[facility] EMERGENCY DEPT Provider Note   CSN: [id number] Arrival date & time: 04/28/21  1319     History Chief Complaint  Patient presents with   Rectal Bleeding    Ryan Garcia is a 87 y.o. male.  Pt presents to the ED today with rectal bleeding and pain.  The pt said he had rectal pain about 2-3 weeks ago.  He said the pain improved, but has noticed some intermittent rectal bleeding for the past 1.5 weeks.  Pain is totally gone now.  Pt denies any sob or weakness.        Past Medical History:  Diagnosis Date   AC (acromioclavicular) joint bone spurs    lt ankle   Allergy    allergic rhinitis   Anginal pain (HCC)    Arthritis    OA   BPH (benign prostatic hyperplasia)    microwave tx of prostate   CAD (coronary artery disease)    a. s/p cath in 2012 showing 70-75% LAD stenosis, 75% D1 stenosis, 75% OM1, 60-70% RCA stenosis, and 80% PDA --> medical therapy pursued b. similar findings by cath in 2016; 06/2017 DES to distal RCA   Chronic kidney disease    Chronic upper back pain    Diabetes mellitus    type II x8 years   GERD (gastroesophageal reflux disease)    HLD (hyperlipidemia)    10/12: TC 208, TG 213, HDL 36, LDL 129   Hx of skin cancer, basal cell    many skin cancers removed/under constant treatment.   Hypertension    NSTEMI (non-ST elevated myocardial infarction) ([place]) 06/18/2017   DES to distal RCA   Obstructive sleep apnea on CPAP    Scoliosis     Patient Active Problem List   Diagnosis Date Noted   Atrial flutter with rapid ventricular response ([place])    Rapid atrial fibrillation ([place]) 10/11/2020   Laceration of skin of scalp 08/16/2020   Head injury 08/16/2020   Poor balance 07/26/2020   Anemia 04/20/2020   Renal mass, right 04/18/2020   Fever 04/14/2020   AKI (acute kidney injury) ([place]) 04/14/2020   Cholangitis 04/14/2020   Unstable angina (HCC)    Chest pain 04/11/2020   Left elbow pain 11/18/2019   Pain and swelling of  right lower leg 06/23/2019   Coronary artery disease involving native coronary artery of native heart without angina pectoris 01/18/2019   Medicare annual wellness visit, subsequent 01/16/2019   Ecchymosis 10/22/2018   Obesity (BMI 30-39.9) 07/09/2018   HOH (hard of hearing) 03/31/2018   Uncontrolled type 2 diabetes mellitus with hyperglycemia ([place]) 10/28/2017   Subclinical hypothyroidism 10/28/2017   Scoliosis    Obstructive sleep apnea on CPAP    Hx of skin cancer, basal cell    Hyperlipidemia associated with type 2 diabetes mellitus (HCC)    GERD (gastroesophageal reflux disease)    Chronic upper back pain    Arthritis    Allergy    NSTEMI (non-ST elevated myocardial infarction) ([place]) 06/18/2017   Actinic keratoses 04/29/2017   Positive colorectal cancer screening using Cologuard test 10/31/2016   BPH (benign prostatic hyperplasia) 09/04/2016   B12 deficiency 11/22/2015   Routine general medical examination at a health care facility 08/26/2015   Abnormal nuclear cardiac imaging test    Dyspnea on exertion 11/27/2013   Encounter for examination of normal volunteer in research study 05/29/2013   Prostate cancer screening 07/15/2012   CAD S/P percutaneous coronary angioplasty 05/17/2011   Nonspecific  abnormal results of cardiovascular function study 04/30/2011   Abnormal EKG 04/06/2011   Right low back pain 01/13/2010   Insulin dependent diabetes mellitus 10/04/2006   ERECTILE DYSFUNCTION 10/04/2006   Primary hypertension 10/04/2006   Allergic rhinitis 10/04/2006   OSTEOARTHRITIS 10/04/2006   Sleep apnea 10/04/2006   EDEMA 10/04/2006   SKIN CANCER, HX OF 10/04/2006    Past Surgical History:  Procedure Laterality Date   BREAST SURGERY  2009   breast lump removed benign   BUBBLE STUDY  10/14/2020   Procedure: BUBBLE STUDY;  Surgeon: [name], MD;  Location: [facility];  Service: Cardiovascular;;   CARDIAC CATHETERIZATION N/A 03/15/2015   Procedure: Left Heart  Cath and Coronary Angiography;  Surgeon: Belva Crome, MD;  Location: [facility];  Service: Cardiovascular;  Laterality: N/A;   CARDIOVERSION N/A 10/14/2020   Procedure: CARDIOVERSION;  Surgeon: [name], MD;  Location: [facility];  Service: Cardiovascular;  Laterality: N/A;   CORONARY STENT INTERVENTION N/A 06/18/2017   Procedure: CORONARY STENT INTERVENTION;  Surgeon: Jettie Booze, MD;  Location: [facility];  Service: Cardiovascular;  Laterality: N/A;  RCA   LEFT HEART CATH AND CORONARY ANGIOGRAPHY N/A 06/18/2017   Procedure: LEFT HEART CATH AND CORONARY ANGIOGRAPHY;  Surgeon: Jettie Booze, MD;  Location: [facility];  Service: Cardiovascular;  Laterality: N/A;   LEFT HEART CATH AND CORONARY ANGIOGRAPHY N/A 04/12/2020   Procedure: LEFT HEART CATH AND CORONARY ANGIOGRAPHY;  Surgeon: Martinique, Peter M, MD;  Location: [facility];  Service: Cardiovascular;  Laterality: N/A;   TEE WITHOUT CARDIOVERSION N/A 10/14/2020   Procedure: TRANSESOPHAGEAL ECHOCARDIOGRAM (TEE);  Surgeon: [name], MD;  Location: [facility];  Service: Cardiovascular;  Laterality: N/A;   ULTRASOUND GUIDANCE FOR VASCULAR ACCESS  06/18/2017   Procedure: Ultrasound Guidance For Vascular Access;  Surgeon: Jettie Booze, MD;  Location: [facility];  Service: Cardiovascular;;  right radial, right femoral       Family History  Problem Relation Age of Onset   Heart attack Brother    Colon cancer Neg Hx    Colon polyps Neg Hx    Stomach cancer Neg Hx    Esophageal cancer Neg Hx     Social History   Tobacco Use   Smoking status: Never   Smokeless tobacco: Never  Vaping Use   Vaping Use: Never used  Substance Use Topics   Alcohol use: No    Alcohol/week: 0.0 standard drinks   Drug use: No    Home Medications Prior to Admission medications   Medication Sig Start Date End Date Taking? Authorizing Provider  Hydrocortisone, Perianal,  (PROCTO-PAK) 1 % CREA Apply 1 inch topically 2 (two) times daily. 04/28/21  Yes Isla Pence, MD  acetaminophen (TYLENOL) 325 MG tablet Take 325-650 mg by mouth every 6 (six) hours as needed for mild pain or headache.    [provider]  Alcohol Swabs (B-D SINGLE USE SWABS REGULAR) PADS Use to check blood sugar 2 times a day 03/30/21   Tower, Wynelle Fanny, MD  apixaban (ELIQUIS) 5 MG TABS tablet Take 1 tablet (5 mg total) by mouth 2 (two) times daily. 10/15/20   George Hugh, MD  aspirin 81 MG tablet Take 81 mg by mouth daily.    [provider]  Blood Glucose Calibration (TRUE METRIX LEVEL 1) Low SOLN Use to check control on glucose meter 03/30/21   Tower, Wynelle Fanny, MD  Blood Glucose  Monitoring Suppl (TRUE METRIX AIR GLUCOSE METER) w/Device KIT 1 kit by Other route 2 (two) times daily. Check blood sugar twice daily and as directed. Dx E11.65 03/30/21   Tower, Wynelle Fanny, MD  cetirizine (ZYRTEC) 10 MG tablet TAKE 1 TABLET EVERY DAY Patient taking differently: Take 10 mg by mouth daily as needed for allergies. 11/14/16   Tower, Wynelle Fanny, MD  Coenzyme Q10 (CO Q 10 PO) Take 200 mg by mouth daily.    [provider]  Cyanocobalamin (VITAMIN B12) 1000 MCG TBCR Take 1,000 mcg by mouth daily. 04/12/20   Cheryln Manly, NP  fluticasone (FLONASE) 50 MCG/ACT nasal spray Place 1 spray into both nostrils daily as needed for allergies.     [provider]  glipiZIDE (GLUCOTROL) 10 MG tablet TAKE 1 TABLET TWICE DAILY WITH MEALS 12/22/20   Tower, [place] A, MD  glucose blood (TRUE METRIX BLOOD GLUCOSE TEST) test strip CHECK BLOOD SUGAR TWICE DAILY 03/30/21   Tower, Roque Lias A, MD  insulin glargine (LANTUS SOLOSTAR) 100 UNIT/ML Solostar Pen INJECT 40 UNITS INTO THE SKIN AT BEDTIME. 04/18/21   Tower, Wynelle Fanny, MD  Insulin Pen Needle (DROPLET PEN NEEDLES) 31G X 6 MM MISC USE ONE TIME DAILY  AT  BEDTIME  WITH  LANTUS 12/12/20   Tower, Wynelle Fanny, MD  isosorbide mononitrate (IMDUR) 60 MG 24 hr tablet  TAKE 1 TABLET EVERY DAY 02/02/21   Lelon Perla, MD  metFORMIN (GLUCOPHAGE) 1000 MG tablet TAKE 1 TABLET TWICE DAILY WITH MEALS 03/17/21   Tower, Wynelle Fanny, MD  metoprolol tartrate (LOPRESSOR) 25 MG tablet Take 1 tablet (25 mg total) by mouth 2 (two) times daily. 12/30/20   Lelon Perla, MD  nitroGLYCERIN (NITROSTAT) 0.4 MG SL tablet Place 1 tablet (0.4 mg total) under the tongue every 5 (five) minutes as needed for chest pain. 05/11/19   [facility], Tami Lin, PA  rosuvastatin (CRESTOR) 40 MG tablet Take 1 tablet (40 mg total) by mouth daily. Patient taking differently: Take 40 mg by mouth at bedtime. 08/25/20   Lelon Perla, MD  TRUEplus Lancets 30G MISC Check blood sugar twice daily and as directed. Dx E11.65 03/30/21   Abner Greenspan, MD    Allergies    Loratadine and Simvastatin  Review of Systems   Review of Systems  Gastrointestinal:  Positive for blood in stool.   Physical Exam Updated Vital Signs BP (!) 152/69   Pulse 64   Temp 98.3 F (36.8 C)   Resp 18   Ht 5' 11"  (1.803 m)   Wt 117 kg   SpO2 100%   BMI 35.98 kg/m   Physical Exam Vitals and nursing note reviewed.  Constitutional:      Appearance: Normal appearance. He is obese.  HENT:     Head: Normocephalic and atraumatic.     Right Ear: External ear normal.     Left Ear: External ear normal.     Nose: Nose normal.     Mouth/Throat:     Mouth: Mucous membranes are moist.     Pharynx: Oropharynx is clear.  Eyes:     Extraocular Movements: Extraocular movements intact.     Conjunctiva/sclera: Conjunctivae normal.     Pupils: Pupils are equal, round, and reactive to light.  Cardiovascular:     Rate and Rhythm: Normal rate and regular rhythm.     Pulses: Normal pulses.     Heart sounds: Normal heart sounds.  Pulmonary:  Effort: Pulmonary effort is normal.     Breath sounds: Normal breath sounds.  Abdominal:     General: Abdomen is flat. Bowel sounds are normal.     Palpations: Abdomen is soft.   Genitourinary:    Comments: Anal fissure felt.  No external hemorrhoids noted. Musculoskeletal:        General: Normal range of motion.     Cervical back: Normal range of motion and neck supple.  Skin:    General: Skin is warm.     Capillary Refill: Capillary refill takes less than 2 seconds.  Neurological:     General: No focal deficit present.     Mental Status: He is alert and oriented to person, place, and time.  Psychiatric:        Mood and Affect: Mood normal.        Behavior: Behavior normal.    ED Results / Procedures / Treatments   Labs (all labs ordered are listed, but only abnormal results are displayed) Labs Reviewed  BASIC METABOLIC PANEL - Abnormal; Notable for the following components:      Result Value   Glucose, Bld 252 (*)    All other components within normal limits  CBC WITH DIFFERENTIAL/PLATELET    EKG None  Radiology No results found.  Procedures Procedures   Medications Ordered in ED Medications - No data to display  ED Course  I have reviewed the triage vital signs and the nursing notes.  Pertinent labs & imaging results that were available during my care of the patient were reviewed by me and considered in my medical decision making (see chart for details).    MDM Rules/Calculators/A&P                           Pt's sx have been going on for nearly 2 weeks and his hgb was nl.  Pt's initial pain was likely the formation of the anal fissure.  Bleeding is likely from an internal hemorrhoid or from the fissure.  Pt is d/c with hydrocortisone.  He is referred to [facility].  He is to return if worse. F/u with pcp.   Final Clinical Impression(s) / ED Diagnoses Final diagnoses:  Rectal bleeding  Rectal fissure    Rx / DC Orders ED Discharge Orders          Ordered    Ambulatory referral to Gastroenterology        04/28/21 1704    Hydrocortisone, Perianal, (PROCTO-PAK) 1 % CREA  2 times daily        04/28/21 1709              Isla Pence, MD 04/28/21 1719

## 2021-04-28 NOTE — ED Notes (Signed)
Patient verbalizes understanding of discharge instructions. Opportunity for questioning and answers were provided. Patient discharged from ED.  °

## 2021-05-24 DIAGNOSIS — Z794 Long term (current) use of insulin: Secondary | ICD-10-CM | POA: Diagnosis not present

## 2021-05-24 DIAGNOSIS — E1159 Type 2 diabetes mellitus with other circulatory complications: Secondary | ICD-10-CM | POA: Diagnosis not present

## 2021-05-24 DIAGNOSIS — E1142 Type 2 diabetes mellitus with diabetic polyneuropathy: Secondary | ICD-10-CM | POA: Diagnosis not present

## 2021-05-29 DIAGNOSIS — D2271 Melanocytic nevi of right lower limb, including hip: Secondary | ICD-10-CM | POA: Diagnosis not present

## 2021-05-29 DIAGNOSIS — D2262 Melanocytic nevi of left upper limb, including shoulder: Secondary | ICD-10-CM | POA: Diagnosis not present

## 2021-05-29 DIAGNOSIS — D225 Melanocytic nevi of trunk: Secondary | ICD-10-CM | POA: Diagnosis not present

## 2021-05-29 DIAGNOSIS — D485 Neoplasm of uncertain behavior of skin: Secondary | ICD-10-CM | POA: Diagnosis not present

## 2021-05-29 DIAGNOSIS — L570 Actinic keratosis: Secondary | ICD-10-CM | POA: Diagnosis not present

## 2021-05-29 DIAGNOSIS — D2261 Melanocytic nevi of right upper limb, including shoulder: Secondary | ICD-10-CM | POA: Diagnosis not present

## 2021-05-29 DIAGNOSIS — X32XXXA Exposure to sunlight, initial encounter: Secondary | ICD-10-CM | POA: Diagnosis not present

## 2021-05-29 DIAGNOSIS — L821 Other seborrheic keratosis: Secondary | ICD-10-CM | POA: Diagnosis not present

## 2021-05-29 DIAGNOSIS — D2272 Melanocytic nevi of left lower limb, including hip: Secondary | ICD-10-CM | POA: Diagnosis not present

## 2021-05-29 DIAGNOSIS — D044 Carcinoma in situ of skin of scalp and neck: Secondary | ICD-10-CM | POA: Diagnosis not present

## 2021-05-30 ENCOUNTER — Telehealth: Payer: Self-pay | Admitting: Family Medicine

## 2021-05-30 NOTE — Telephone Encounter (Signed)
LVM for pt to rtn my call to schedule AWV with [facility]. Please schedule AWV if pt calls the office  

## 2021-06-03 ENCOUNTER — Other Ambulatory Visit: Payer: Self-pay | Admitting: Cardiology

## 2021-06-27 NOTE — Progress Notes (Signed)
Subjective:   Ryan Garcia is a 87 y.o. male who presents for Medicare Annual/Subsequent preventive examination.  I connected with Ryan Garcia today by telephone and verified that I am speaking with the correct person using two identifiers. Location patient: home Location provider: work Persons participating in the virtual visit: patient, Marine scientist.    I discussed the limitations, risks, security and privacy concerns of performing an evaluation and management service by telephone and the availability of in person appointments. I also discussed with the patient that there may be a patient responsible charge related to this service. The patient expressed understanding and verbally consented to this telephonic visit.    Interactive audio and video telecommunications were attempted between this provider and patient, however failed, due to patient having technical difficulties OR patient did not have access to video capability.  We continued and completed visit with audio only.  Some vital signs may be absent or patient reported.   Time Spent with patient on telephone encounter: 30 minutes  Review of Systems     Cardiac Risk Factors include: advanced age (>51mn, >>53women);diabetes mellitus;dyslipidemia;hypertension     Objective:    Today's Vitals   06/29/21 1116  Weight: 256 lb (116.1 kg)  Height: 5' 11"  (1.803 m)   Body mass index is 35.7 kg/m.  Advanced Directives 06/29/2021 04/28/2021 10/12/2020 10/12/2020 08/02/2020 04/15/2020 04/11/2020  Does Patient Have a Medical Advance Directive? Yes Yes - No No No No  Type of [place] - - - - - -  Does patient want to make changes to medical advance directive? Yes (MAU/Ambulatory/Procedural Areas - Information given) - - - - - -  Copy of [place] Chart? Yes - validated most recent copy scanned in chart (See row information) - - - - - -  Would patient like information on  creating a medical advance directive? - - Yes (Inpatient - patient requests chaplain consult to create a medical advance directive) - No - Patient declined No - Patient declined -  Pre-existing out of facility DNR order (yellow form or pink MOST form) - - - - - - -    Current Medications (verified) Outpatient Encounter Medications as of 06/29/2021  Medication Sig   acetaminophen (TYLENOL) 325 MG tablet Take 325-650 mg by mouth every 6 (six) hours as needed for mild pain or headache.   Alcohol Swabs (B-D SINGLE USE SWABS REGULAR) PADS Use to check blood sugar 2 times a day   apixaban (ELIQUIS) 5 MG TABS tablet Take 1 tablet (5 mg total) by mouth 2 (two) times daily.   aspirin 81 MG tablet Take 81 mg by mouth daily.   Blood Glucose Calibration (TRUE METRIX LEVEL 1) Low SOLN Use to check control on glucose meter   Blood Glucose Monitoring Suppl (TRUE METRIX AIR GLUCOSE METER) w/Device KIT 1 kit by Other route 2 (two) times daily. Check blood sugar twice daily and as directed. Dx E11.65   cetirizine (ZYRTEC) 10 MG tablet TAKE 1 TABLET EVERY DAY (Patient taking differently: Take 10 mg by mouth daily as needed for allergies.)   Coenzyme Q10 (CO Q 10 PO) Take 200 mg by mouth daily.   Cyanocobalamin (VITAMIN B12) 1000 MCG TBCR Take 1,000 mcg by mouth daily.   docusate sodium (STOOL SOFTENER) 100 MG capsule Take 100 mg by mouth 2 (two) times daily.   fluticasone (FLONASE) 50 MCG/ACT nasal spray Place 1 spray into both nostrils daily as  needed for allergies.    glipiZIDE (GLUCOTROL) 10 MG tablet TAKE 1 TABLET TWICE DAILY WITH MEALS   glucose blood (TRUE METRIX BLOOD GLUCOSE TEST) test strip CHECK BLOOD SUGAR TWICE DAILY   Hydrocortisone, Perianal, (PROCTO-PAK) 1 % CREA Apply 1 inch topically 2 (two) times daily.   insulin glargine (LANTUS SOLOSTAR) 100 UNIT/ML Solostar Pen INJECT 40 UNITS INTO THE SKIN AT BEDTIME.   Insulin Pen Needle (DROPLET PEN NEEDLES) 31G X 6 MM MISC USE ONE TIME DAILY  AT  BEDTIME   WITH  LANTUS   isosorbide mononitrate (IMDUR) 60 MG 24 hr tablet TAKE 1 TABLET EVERY DAY   metFORMIN (GLUCOPHAGE) 1000 MG tablet TAKE 1 TABLET TWICE DAILY WITH MEALS   metoprolol tartrate (LOPRESSOR) 25 MG tablet TAKE 1/2 TABLET TWICE DAILY   nitroGLYCERIN (NITROSTAT) 0.4 MG SL tablet Place 1 tablet (0.4 mg total) under the tongue every 5 (five) minutes as needed for chest pain.   rosuvastatin (CRESTOR) 40 MG tablet Take 1 tablet (40 mg total) by mouth daily. (Patient taking differently: Take 40 mg by mouth at bedtime.)   TRUEplus Lancets 30G MISC Check blood sugar twice daily and as directed. Dx E11.65   [DISCONTINUED] Insulin Pen Needle (DROPLET PEN NEEDLES) 31G X 6 MM MISC USE ONE TIME DAILY  AT  BEDTIME  WITH  LANTUS   No facility-administered encounter medications on file as of 06/29/2021.    Allergies (verified) Loratadine and Simvastatin   History: Past Medical History:  Diagnosis Date   AC (acromioclavicular) joint bone spurs    lt ankle   Allergy    allergic rhinitis   Anginal pain (HCC)    Arthritis    OA   BPH (benign prostatic hyperplasia)    microwave tx of prostate   CAD (coronary artery disease)    a. s/p cath in 2012 showing 70-75% LAD stenosis, 75% D1 stenosis, 75% OM1, 60-70% RCA stenosis, and 80% PDA --> medical therapy pursued b. similar findings by cath in 2016; 06/2017 DES to distal RCA   Chronic kidney disease    Chronic upper back pain    Diabetes mellitus    type II x8 years   GERD (gastroesophageal reflux disease)    HLD (hyperlipidemia)    10/12: TC 208, TG 213, HDL 36, LDL 129   Hx of skin cancer, basal cell    many skin cancers removed/under constant treatment.   Hypertension    NSTEMI (non-ST elevated myocardial infarction) ([place]) 06/18/2017   DES to distal RCA   Obstructive sleep apnea on CPAP    Scoliosis    Past Surgical History:  Procedure Laterality Date   BREAST SURGERY  2009   breast lump removed benign   BUBBLE STUDY  10/14/2020    Procedure: BUBBLE STUDY;  Surgeon: [name], MD;  Location: [place];  Service: Cardiovascular;;   CARDIAC CATHETERIZATION N/A 03/15/2015   Procedure: Left Heart Cath and Coronary Angiography;  Surgeon: Belva Crome, MD;  Location: [place] [facility];  Service: Cardiovascular;  Laterality: N/A;   CARDIOVERSION N/A 10/14/2020   Procedure: CARDIOVERSION;  Surgeon: [name], MD;  Location[facility] END[facility];  Service: Cardiovascular;  Laterality: N/A;   CORONARY STENT INTERVENTION N/A 06/18/2017   Procedure: CORONARY STENT INTERVENTION;  Surgeon: Jettie Booze, MD;  Location: [place] [facility];  Service: Cardiovascular;  Laterality: N/A;  RCA   LEFT HEART CATH AND CORONARY ANGIOGRAPHY N/A 06/18/2017   Procedure: LEFT HEART CATH AND CORONARY ANGIOGRAPHY;  Surgeon: Jettie Booze, MD;  Location: [place] [facility];  Service: Cardiovascular;  Laterality: N/A;   LEFT HEART CATH AND CORONARY ANGIOGRAPHY N/A 04/12/2020   Procedure: LEFT HEART CATH AND CORONARY ANGIOGRAPHY;  Surgeon: Martinique, Peter M, MD;  Location: [place] [facility];  Service: Cardiovascular;  Laterality: N/A;   TEE WITHOUT CARDIOVERSION N/A 10/14/2020   Procedure: TRANSESOPHAGEAL ECHOCARDIOGRAM (TEE);  Surgeon: [name], MD;  Location: [place];  Service: Cardiovascular;  Laterality: N/A;   ULTRASOUND GUIDANCE FOR VASCULAR ACCESS  06/18/2017   Procedure: Ultrasound Guidance For Vascular Access;  Surgeon: Jettie Booze, MD;  Location: [place] [facility];  Service: Cardiovascular;;  right radial, right femoral   Family History  Problem Relation Age of Onset   Heart attack Brother    Colon cancer Neg Hx    Colon polyps Neg Hx    Stomach cancer Neg Hx    Esophageal cancer Neg Hx    Social History   Socioeconomic History   Marital status: Married    Spouse name: Michah Minton   Number of children: 3   Years of education: Not on file   Highest education level: Not on file   Occupational History   Not on file  Tobacco Use   Smoking status: Never   Smokeless tobacco: Never  Vaping Use   Vaping Use: Never used  Substance and Sexual Activity   Alcohol use: No    Alcohol/week: 0.0 standard drinks   Drug use: No   Sexual activity: Not Currently  Other Topics Concern   Not on file  Social History Narrative   Married    Physically active hard of hearing   Social Determinants of Health   Financial Resource Strain: Low Risk    Difficulty of Paying Living Expenses: Not hard at all  Food Insecurity: No Food Insecurity   Worried About Charity fundraiser in the Last Year: Never true   Arboriculturist in the Last Year: Never true  Transportation Needs: No Transportation Needs   Lack of Transportation (Medical): No   Lack of Transportation (Non-Medical): No  Physical Activity: Inactive   Days of Exercise per Week: 0 days   Minutes of Exercise per Session: 0 min  Stress: No Stress Concern Present   Feeling of Stress : Not at all  Social Connections: Moderately Integrated   Frequency of Communication with Friends and Family: More than three times a week   Frequency of Social Gatherings with Friends and Family: More than three times a week   Attends Religious Services: More than 4 times per year   Active Member of [employer] or Organizations: No   Attends Music therapist: Never   Marital Status: Married    Tobacco Counseling Counseling given: Not Answered   Clinical Intake:  Pre-visit preparation completed: Yes  Pain : No/denies pain     BMI - recorded: 35.7 Nutritional Status: BMI > 30  Obese Nutritional Risks: None Diabetes: Yes CBG done?: No Did pt. bring in CBG monitor from home?: No  How often do you need to have someone help you when you read instructions, pamphlets, or other written materials from your doctor or pharmacy?: 1 - Never  Diabetes:  Is the patient diabetic?  Yes  If diabetic, was a CBG obtained today?  No ,  visit completed over the phone.  Did the patient bring in their glucometer from home?  No  How often do you monitor your  CBG's? 2 times daily.   Financial Strains and Diabetes Management:  Are you having any financial strains with the device, your supplies or your medication? No .  Does the patient want to be seen by Chronic Care Management for management of their diabetes?  No  Would the patient like to be referred to a Nutritionist or for Diabetic Management?  No   Diabetic Exams:  Diabetic Eye Exam: Completed 2022, patient unsure of month.  Diabetic Foot Exam: . Pt due, last exam completed 01/16/19.   Interpreter Needed?: No  Information entered by :: Orrin Brigham LPN   Activities of Daily Living In your present state of health, do you have any difficulty performing the following activities: 06/29/2021 10/12/2020  Hearing? Tempie Donning  Vision? Y Y  Difficulty concentrating or making decisions? N Y  Comment has troubles remembering names sometimes  Walking or climbing stairs? Y Y  Comment basement stairs -  Dressing or bathing? N N  Doing errands, shopping? N Y  Conservation officer, nature and eating ? N -  Using the Toilet? N -  In the past six months, have you accidently leaked urine? Y -  Do you have problems with loss of bowel control? N -  Managing your Medications? N -  Managing your Finances? N -  Housekeeping or managing your Housekeeping? N -  Some recent data might be hidden    Patient Care Team: Tower, Wynelle Fanny, MD as PCP - General Crenshaw, Denice Bors, MD as PCP - Cardiology (Cardiology) Leandrew Koyanagi, MD as Referring Physician (Ophthalmology) Stanford Breed Denice Bors, MD as Consulting Physician (Cardiology) Dasher, Rayvon Char, MD as Consulting Physician (Dermatology) Salomon Fick., MD as Referring Physician (Dentistry) Debbora Dus, [facility] as Pharmacist (Pharmacist)  Indicate any recent Medical Services you may have received from other than [facility] providers in the past year  (date may be approximate).     Assessment:   This is a routine wellness examination for [place].  Hearing/Vision screen Hearing Screening - Comments:: Wears hearing aids Vision Screening - Comments:: Last exam 2022, Dr. Wallace Going   Dietary issues and exercise activities discussed: Current Exercise Habits: The patient does not participate in regular exercise at present   Goals Addressed             This Visit's Progress    Patient Stated       Would like to maintain current routine        Depression Screen PHQ 2/9 Scores 06/29/2021 01/18/2020 01/16/2019 10/15/2017 09/07/2016 08/26/2015 07/23/2012  PHQ - 2 Score 0 0 0 0 0 0 0  PHQ- 9 Score - - - 0 - - -    Fall Risk Fall Risk  06/29/2021 01/18/2020 01/16/2019 10/15/2017 09/07/2016  Falls in the past year? 1 0 1 No No  Number falls in past yr: 0 - 0 - -  Injury with Fall? 1 - 0 - -  Comment recieved staples, patient hit head - - - -  Risk for fall due to : Other (Comment) - - - -  Risk for fall due to: Comment took a missed step - - - -  Follow up Falls prevention discussed Falls evaluation completed - - -    FALL RISK PREVENTION PERTAINING TO THE HOME:  Any stairs in or around the home? Yes  If so, are there any without handrails? No  Home free of loose throw rugs in walkways, pet beds, electrical cords, etc? Yes  Adequate lighting in your home to  reduce risk of falls? Yes   ASSISTIVE DEVICES UTILIZED TO PREVENT FALLS:  Life alert? No  Use of a cane, walker or w/c? No  Grab bars in the bathroom? Yes  Shower chair or bench in shower? No  Elevated toilet seat or a handicapped toilet? No   TIMED UP AND GO:  Was the test performed? No .    Cognitive Function: Normal cognitive status assessed by this Nurse Health Advisor. No abnormalities found.   MMSE - Mini Mental State Exam 10/15/2017 09/07/2016  Orientation to time 5 5  Orientation to Place 5 5  Registration 3 3  Attention/ Calculation 0 0  Recall 3 3  Language-  name 2 objects 0 0  Language- repeat 1 1  Language- follow 3 step command 3 3  Language- read & follow direction 0 0  Write a sentence 0 0  Copy design 0 0  Total score 20 20        Immunizations Immunization History  Administered Date(s) Administered   Fluad Quad(high Dose 65+) 06/23/2019, 04/20/2020   Influenza Split 07/09/2011   Influenza Whole 06/01/2002, 05/22/2007   Influenza, Seasonal, Injecte, Preservative Fre 07/23/2012   Influenza,inj,Quad PF,6+ Mos 05/29/2013, 03/01/2014, 08/26/2015, 05/29/2016, 04/29/2017, 05/01/2018   PFIZER(Purple Top)SARS-COV-2 Vaccination 09/14/2019, 10/05/2019, 06/03/2020   Pneumococcal Conjugate-13 10/04/2014   Pneumococcal Polysaccharide-23 06/08/2002, 05/22/2007   Td 01/22/2001, 08/16/2020   Tdap 07/09/2011   Zoster, Live 11/21/2012    TDAP status: Up to date  Flu Vaccine status: Due, Education has been provided regarding the importance of this vaccine. Advised may receive this vaccine at local pharmacy or Health Dept. Aware to provide a copy of the vaccination record if obtained from local pharmacy or Health Dept. Verbalized acceptance and understanding.  Pneumococcal vaccine status: Up to date  Covid-19 vaccine status: Information provided on how to obtain vaccines.   Qualifies for Shingles Vaccine? Yes   Zostavax completed Yes   Shingrix Completed?: No.    Education has been provided regarding the importance of this vaccine. Patient has been advised to call insurance company to determine out of pocket expense if they have not yet received this vaccine. Advised may also receive vaccine at local pharmacy or Health Dept. Verbalized acceptance and understanding.  Screening Tests Health Maintenance  Topic Date Due   Zoster Vaccines- Shingrix (1 of 2) Never done   URINE MICROALBUMIN  05/10/2011   FOOT EXAM  01/16/2020   OPHTHALMOLOGY EXAM  07/23/2020   COVID-19 Vaccine (4 - Booster for Pfizer series) 07/29/2020   INFLUENZA VACCINE   01/30/2021   HEMOGLOBIN A1C  04/12/2021   TETANUS/TDAP  08/16/2030   Pneumonia Vaccine 20+ Years old  Completed   HPV VACCINES  Aged Out    Health Maintenance  Health Maintenance Due  Topic Date Due   Zoster Vaccines- Shingrix (1 of 2) Never done   URINE MICROALBUMIN  05/10/2011   FOOT EXAM  01/16/2020   OPHTHALMOLOGY EXAM  07/23/2020   COVID-19 Vaccine (4 - Booster for Pfizer series) 07/29/2020   INFLUENZA VACCINE  01/30/2021   HEMOGLOBIN A1C  04/12/2021    Colorectal cancer screening: No longer required.   Lung Cancer Screening: (Low Dose CT Chest recommended if Age 65-80 years, 30 pack-year currently smoking OR have quit w/in 15years.) does not qualify.     Additional Screening:  Hepatitis C Screening: does not qualify  Vision Screening: Recommended annual ophthalmology exams for early detection of glaucoma and other disorders of the eye. Is the patient  up to date with their annual eye exam?  Yes  Who is the provider or what is the name of the office in which the patient attends annual eye exams? Dr. Wallace Going    Dental Screening: Recommended annual dental exams for proper oral hygiene  Community Resource Referral / Chronic Care Management: CRR required this visit?  No   CCM required this visit?  No      Plan:     I have personally reviewed and noted the following in the patients chart:   Medical and social history Use of alcohol, tobacco or illicit drugs  Current medications and supplements including opioid prescriptions. Patient is not currently taking opioid prescriptions. Functional ability and status Nutritional status Physical activity Advanced directives List of other physicians Hospitalizations, surgeries, and ER visits in previous 12 months Vitals Screenings to include cognitive, depression, and falls Referrals and appointments  In addition, I have reviewed and discussed with patient certain preventive protocols, quality metrics, and best  practice recommendations. A written personalized care plan for preventive services as well as general preventive health recommendations were provided to patient.    Due to this being a telephonic visit, the after visit summary with patients personalized plan was offered to patient via mail or my-chart.  Patient would like to access on my-chart.     Loma Messing, LPN   02/[id number]   Nurse Health Advisor  Nurse Notes: none

## 2021-06-28 ENCOUNTER — Other Ambulatory Visit: Payer: Self-pay | Admitting: Family Medicine

## 2021-06-29 ENCOUNTER — Ambulatory Visit (INDEPENDENT_AMBULATORY_CARE_PROVIDER_SITE_OTHER): Payer: Medicare HMO

## 2021-06-29 VITALS — Ht 71.0 in | Wt 256.0 lb

## 2021-06-29 DIAGNOSIS — Z0000 Encounter for general adult medical examination without abnormal findings: Secondary | ICD-10-CM | POA: Diagnosis not present

## 2021-06-29 NOTE — Patient Instructions (Signed)
[name] , Thank you for taking time to complete your Medicare Wellness Visit. I appreciate your ongoing commitment to your health goals. Please review the following plan we discussed and let me know if I can assist you in the future.   Screening recommendations/referrals: Colonoscopy: no longer required Recommended yearly ophthalmology/optometry visit for glaucoma screening and checkup Recommended yearly dental visit for hygiene and checkup  Vaccinations: Influenza vaccine: Due-May obtain vaccine at our office or your local pharmacy. Pneumococcal vaccine: up to date Tdap vaccine: up to date , completed 08/16/20, due 08/16/30 Shingles vaccine: May obtain vaccine at your local pharmacy. Covid-19:  newest booster available at your local pharmacy  Advanced directives: copy on file   Conditions/risks identified: see problem list  Next appointment: Follow up in one year for your annual wellness visit.   Preventive Care 85 Years and Older, Male Preventive care refers to lifestyle choices and visits with your health care provider that can promote health and wellness. What does preventive care include? A yearly physical exam. This is also called an annual well check. Dental exams once or twice a year. Routine eye exams. Ask your health care provider how often you should have your eyes checked. Personal lifestyle choices, including: Daily care of your teeth and gums. Regular physical activity. Eating a healthy diet. Avoiding tobacco and drug use. Limiting alcohol use. Practicing safe sex. Taking low doses of aspirin every day. Taking vitamin and mineral supplements as recommended by your health care provider. What happens during an annual well check? The services and screenings done by your health care provider during your annual well check will depend on your age, overall health, lifestyle risk factors, and family history of disease. Counseling  Your health care provider may ask you  questions about your: Alcohol use. Tobacco use. Drug use. Emotional well-being. Home and relationship well-being. Sexual activity. Eating habits. History of falls. Memory and ability to understand (cognition). Work and work Statistician. Screening  You may have the following tests or measurements: Height, weight, and BMI. Blood pressure. Lipid and cholesterol levels. These may be checked every 5 years, or more frequently if you are over 75 years old. Skin check. Lung cancer screening. You may have this screening every year starting at age 67 if you have a 30-pack-year history of smoking and currently smoke or have quit within the past 15 years. Fecal occult blood test (FOBT) of the stool. You may have this test every year starting at age 17. Flexible sigmoidoscopy or colonoscopy. You may have a sigmoidoscopy every 5 years or a colonoscopy every 10 years starting at age 17. Prostate cancer screening. Recommendations will vary depending on your family history and other risks. Hepatitis C blood test. Hepatitis B blood test. Sexually transmitted disease (STD) testing. Diabetes screening. This is done by checking your blood sugar (glucose) after you have not eaten for a while (fasting). You may have this done every 1-3 years. Abdominal aortic aneurysm (AAA) screening. You may need this if you are a current or former smoker. Osteoporosis. You may be screened starting at age 71 if you are at high risk. Talk with your health care provider about your test results, treatment options, and if necessary, the need for more tests. Vaccines  Your health care provider may recommend certain vaccines, such as: Influenza vaccine. This is recommended every year. Tetanus, diphtheria, and acellular pertussis (Tdap, Td) vaccine. You may need a Td booster every 10 years. Zoster vaccine. You may need this after age 53.  Pneumococcal 13-valent conjugate (PCV13) vaccine. One dose is recommended after age  85. Pneumococcal polysaccharide (PPSV23) vaccine. One dose is recommended after age 85. Talk to your health care provider about which screenings and vaccines you need and how often you need them. This information is not intended to replace advice given to you by your health care provider. Make sure you discuss any questions you have with your health care provider. Document Released: 07/15/2015 Document Revised: 03/07/2016 Document Reviewed: 04/19/2015 Elsevier Interactive Patient Education  2017 [place] Prevention in the Home Falls can cause injuries. They can happen to people of all ages. There are many things you can do to make your home safe and to help prevent falls. What can I do on the outside of my home? Regularly fix the edges of walkways and driveways and fix any cracks. Remove anything that might make you trip as you walk through a door, such as a raised step or threshold. Trim any bushes or trees on the path to your home. Use bright outdoor lighting. Clear any walking paths of anything that might make someone trip, such as rocks or tools. Regularly check to see if handrails are loose or broken. Make sure that both sides of any steps have handrails. Any raised decks and porches should have guardrails on the edges. Have any leaves, snow, or ice cleared regularly. Use sand or salt on walking paths during winter. Clean up any spills in your garage right away. This includes oil or grease spills. What can I do in the bathroom? Use night lights. Install grab bars by the toilet and in the tub and shower. Do not use towel bars as grab bars. Use non-skid mats or decals in the tub or shower. If you need to sit down in the shower, use a plastic, non-slip stool. Keep the floor dry. Clean up any water that spills on the floor as soon as it happens. Remove soap buildup in the tub or shower regularly. Attach bath mats securely with double-sided non-slip rug tape. Do not have throw  rugs and other things on the floor that can make you trip. What can I do in the bedroom? Use night lights. Make sure that you have a light by your bed that is easy to reach. Do not use any sheets or blankets that are too big for your bed. They should not hang down onto the floor. Have a firm chair that has side arms. You can use this for support while you get dressed. Do not have throw rugs and other things on the floor that can make you trip. What can I do in the kitchen? Clean up any spills right away. Avoid walking on wet floors. Keep items that you use a lot in easy-to-reach places. If you need to reach something above you, use a strong step stool that has a grab bar. Keep electrical cords out of the way. Do not use floor polish or wax that makes floors slippery. If you must use wax, use non-skid floor wax. Do not have throw rugs and other things on the floor that can make you trip. What can I do with my stairs? Do not leave any items on the stairs. Make sure that there are handrails on both sides of the stairs and use them. Fix handrails that are broken or loose. Make sure that handrails are as long as the stairways. Check any carpeting to make sure that it is firmly attached to the stairs. Fix any  carpet that is loose or worn. Avoid having throw rugs at the top or bottom of the stairs. If you do have throw rugs, attach them to the floor with carpet tape. Make sure that you have a light switch at the top of the stairs and the bottom of the stairs. If you do not have them, ask someone to add them for you. What else can I do to help prevent falls? Wear shoes that: Do not have high heels. Have rubber bottoms. Are comfortable and fit you well. Are closed at the toe. Do not wear sandals. If you use a stepladder: Make sure that it is fully opened. Do not climb a closed stepladder. Make sure that both sides of the stepladder are locked into place. Ask someone to hold it for you, if  possible. Clearly mark and make sure that you can see: Any grab bars or handrails. First and last steps. Where the edge of each step is. Use tools that help you move around (mobility aids) if they are needed. These include: Canes. Walkers. Scooters. Crutches. Turn on the lights when you go into a dark area. Replace any light bulbs as soon as they burn out. Set up your furniture so you have a clear path. Avoid moving your furniture around. If any of your floors are uneven, fix them. If there are any pets around you, be aware of where they are. Review your medicines with your doctor. Some medicines can make you feel dizzy. This can increase your chance of falling. Ask your doctor what other things that you can do to help prevent falls. This information is not intended to replace advice given to you by your health care provider. Make sure you discuss any questions you have with your health care provider. Document Released: 04/14/2009 Document Revised: 11/24/2015 Document Reviewed: 07/23/2014 Elsevier Interactive Patient Education  2017 [employer].

## 2021-07-27 ENCOUNTER — Ambulatory Visit: Payer: Medicare HMO | Admitting: Urology

## 2021-07-31 DIAGNOSIS — C44329 Squamous cell carcinoma of skin of other parts of face: Secondary | ICD-10-CM | POA: Diagnosis not present

## 2021-07-31 DIAGNOSIS — D485 Neoplasm of uncertain behavior of skin: Secondary | ICD-10-CM | POA: Diagnosis not present

## 2021-07-31 DIAGNOSIS — D0439 Carcinoma in situ of skin of other parts of face: Secondary | ICD-10-CM | POA: Diagnosis not present

## 2021-07-31 DIAGNOSIS — L905 Scar conditions and fibrosis of skin: Secondary | ICD-10-CM | POA: Diagnosis not present

## 2021-08-02 ENCOUNTER — Other Ambulatory Visit: Payer: Self-pay | Admitting: Urology

## 2021-08-02 ENCOUNTER — Ambulatory Visit: Payer: Medicare HMO | Admitting: Urology

## 2021-08-02 DIAGNOSIS — N2889 Other specified disorders of kidney and ureter: Secondary | ICD-10-CM

## 2021-08-14 ENCOUNTER — Ambulatory Visit
Admission: RE | Admit: 2021-08-14 | Discharge: 2021-08-14 | Disposition: A | Discharge status: Home / Self Care | Payer: Medicare HMO | Source: Ambulatory Visit | Attending: Urology | Admitting: Urology

## 2021-08-14 ENCOUNTER — Other Ambulatory Visit: Payer: Self-pay

## 2021-08-14 DIAGNOSIS — N281 Cyst of kidney, acquired: Secondary | ICD-10-CM | POA: Diagnosis not present

## 2021-08-14 DIAGNOSIS — K8020 Calculus of gallbladder without cholecystitis without obstruction: Secondary | ICD-10-CM | POA: Diagnosis not present

## 2021-08-14 DIAGNOSIS — N2889 Other specified disorders of kidney and ureter: Secondary | ICD-10-CM | POA: Diagnosis not present

## 2021-08-14 DIAGNOSIS — K769 Liver disease, unspecified: Secondary | ICD-10-CM | POA: Diagnosis not present

## 2021-08-14 IMAGING — MR MR ABDOMEN WO/W CM
17 series · 48 of 48 positions shown · IV contrast (10ml Gadavist)
Comparison: [DATE]

CLINICAL DATA: Right renal mass compatible with renal cell
carcinoma. Surveillance.

EXAM:
MRI ABDOMEN WITHOUT AND WITH CONTRAST
TECHNIQUE: Multiplanar multisequence MR imaging of the abdomen was performed
both before and after the administration of intravenous contrast.
CONTRAST:  10mL GADAVIST GADOBUTROL 1 MMOL/ML IV SOLN

[Series 3: T2 · coronal · 6.5mm · 1.25mm/px · 2 of 30 slices shown (1 of 2)]
[im 1/30]
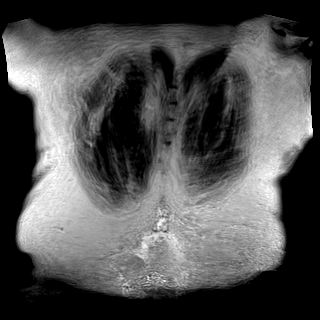
[im 30/30]
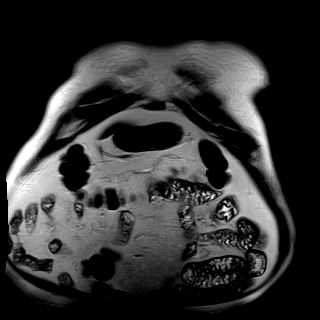

[Series 4: T2 · axial · 7.0mm · 1.25mm/px · z∈[-265,+21]mm · 2 of 35 slices shown (2 of 2)]
[im 1/35]
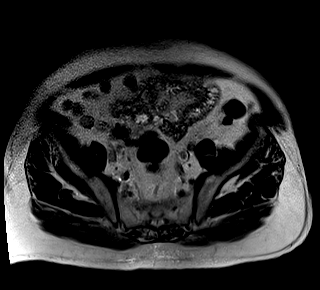
[im 35/35]
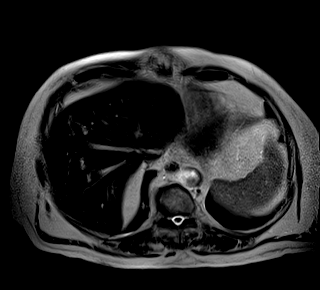

[Series 5: T1 · axial · 3.0mm · 1.25mm/px · z∈[-263,-2]mm · 4 of 88 slices shown (1 of 2)]
[im 1/88]
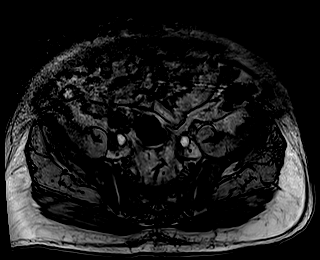
[im 30/88]
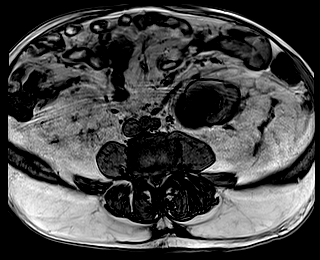
[im 59/88]
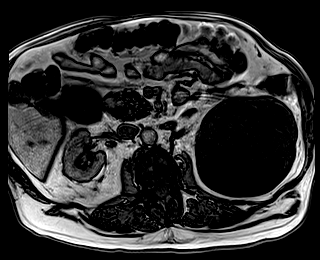
[im 88/88]
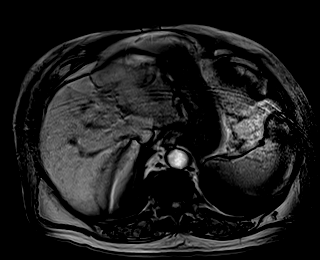

[Series 6: T1 · axial · 3.0mm · 1.25mm/px · z∈[-263,-2]mm · 4 of 88 slices shown (2 of 2)]
[im 1/88]
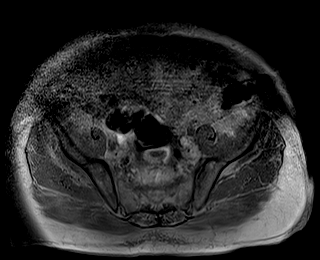
[im 30/88]
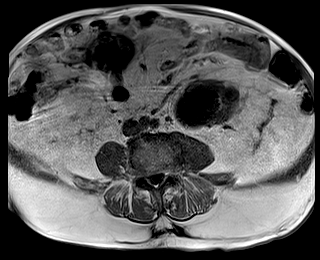
[im 59/88]
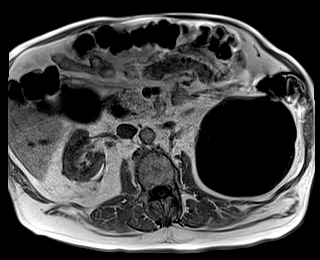
[im 88/88]
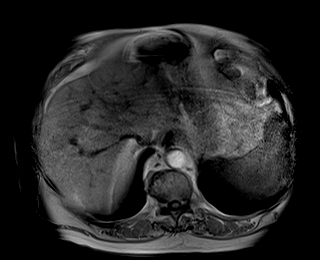

[Series 9: T2 fat-sat · axial · 7.0mm · 1.25mm/px · 1 of 35 slices shown]
[im 1/35]
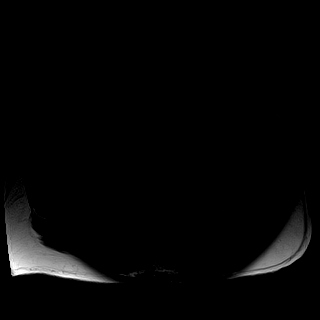

[Series 10: ax dwi_tracew · axial · 7.0mm · 1.49mm/px · z∈[-269,+25]mm · 4 of 108 slices shown]
[im 1/108]
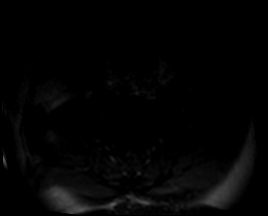
[im 36/108]
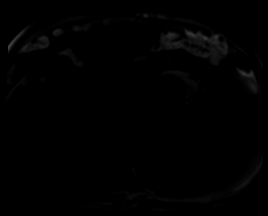
[im 72/108]
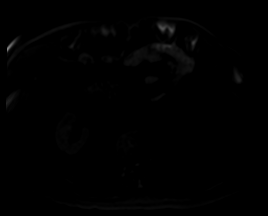
[im 108/108]
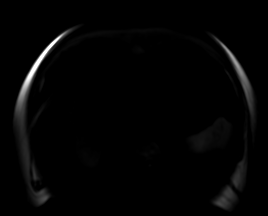

[Series 11: ax dwi_adc · axial · 7.0mm · 1.49mm/px · 1 of 36 slices shown]
[im 1/36]
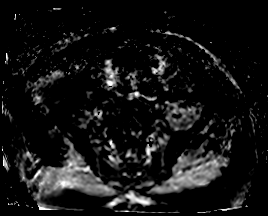

[Series 12: T1 dynamic fat-sat · axial · non-contrast · 3.0mm · 1.25mm/px · z∈[-252,+9]mm · 3 of 88 slices shown (1 of 5)]
[im 1/88]
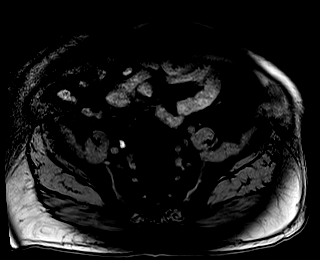
[im 44/88]
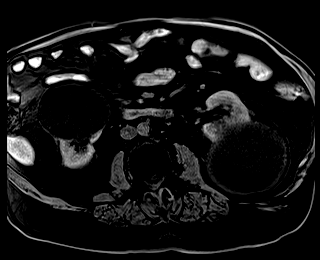
[im 88/88]
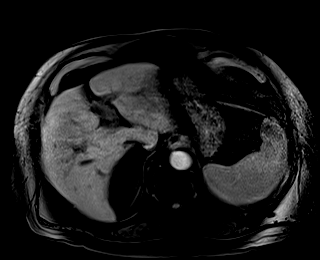

[Series 13: T1 dynamic fat-sat post-contrast · axial · 3.0mm · 1.25mm/px · z∈[-252,+9]mm · 3 of 88 slices shown (1 of 4)]
[im 1/88]
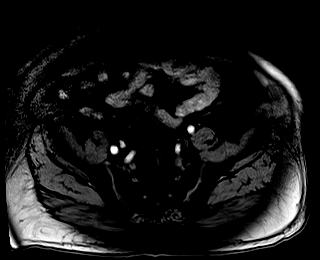
[im 44/88]
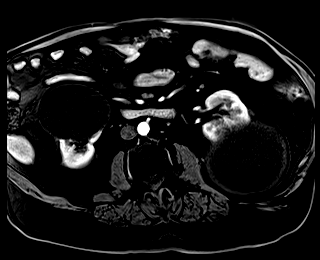
[im 88/88]
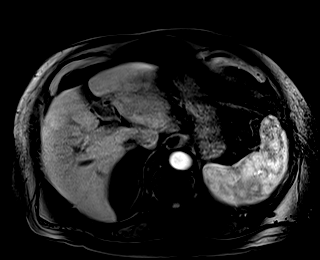

[Series 14: T1 dynamic fat-sat · axial · 3.0mm · 1.25mm/px · z∈[-252,+9]mm · 3 of 88 slices shown (2 of 5)]
[im 1/88]
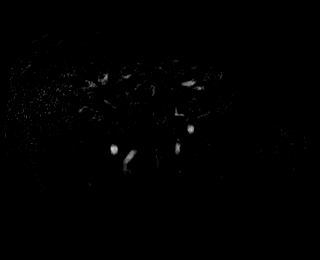
[im 44/88]
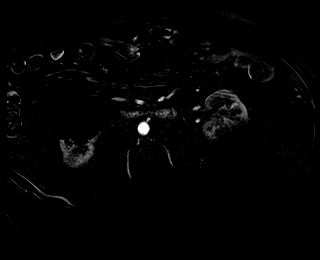
[im 88/88]
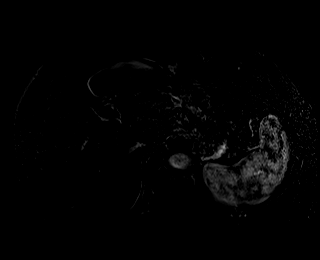

[Series 15: T1 dynamic fat-sat post-contrast · axial · 3.0mm · 1.25mm/px · z∈[-252,+9]mm · 3 of 88 slices shown (2 of 4)]
[im 1/88]
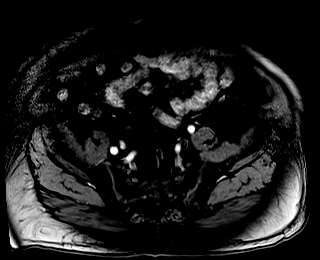
[im 44/88]
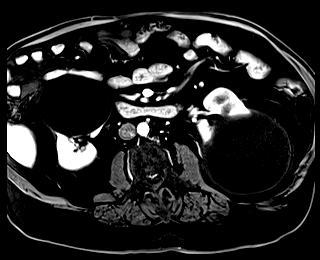
[im 88/88]
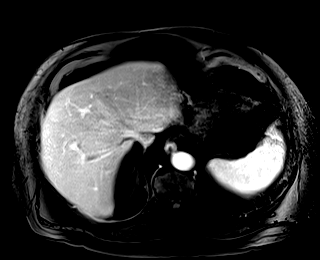

[Series 16: T1 dynamic fat-sat · axial · 3.0mm · 1.25mm/px · z∈[-252,+9]mm · 3 of 88 slices shown (3 of 5)]
[im 1/88]
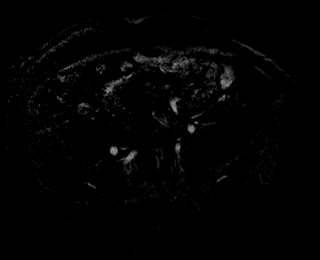
[im 44/88]
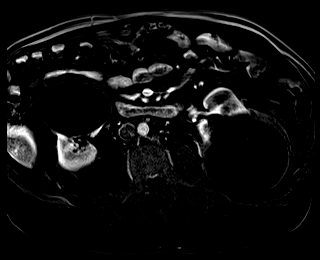
[im 88/88]
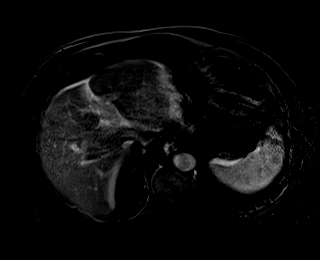

[Series 17: T1 dynamic fat-sat post-contrast · axial · 3.0mm · 1.25mm/px · z∈[-252,+9]mm · 3 of 88 slices shown (3 of 4)]
[im 1/88]
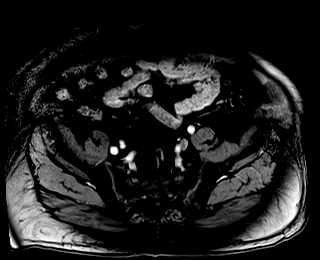
[im 44/88]
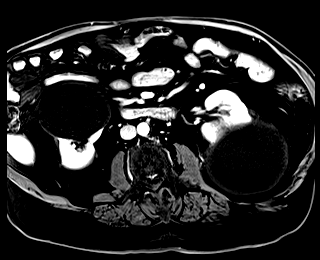
[im 88/88]
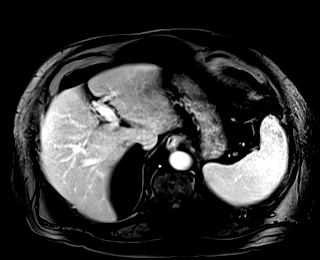

[Series 18: T1 dynamic fat-sat · axial · 3.0mm · 1.25mm/px · z∈[-252,+9]mm · 3 of 88 slices shown (4 of 5)]
[im 1/88]
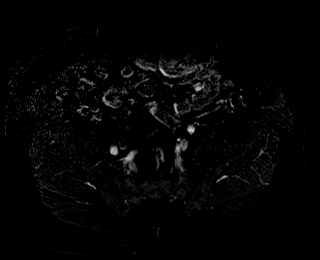
[im 44/88]
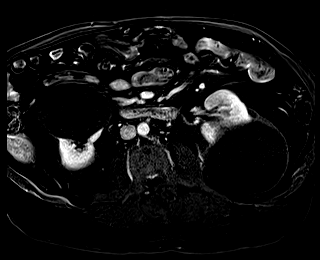
[im 88/88]
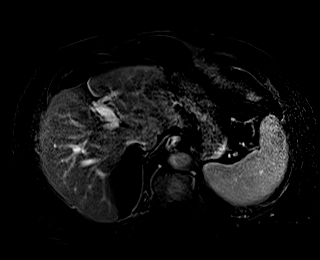

[Series 19: T1 dynamic post-contrast · coronal · 3.0mm · 1.31mm/px · 3 of 72 slices shown]
[im 1/72]
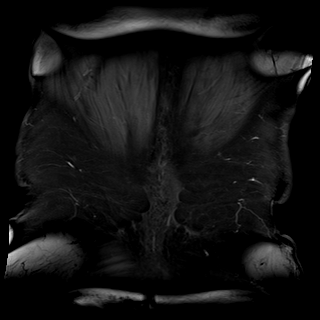
[im 36/72]
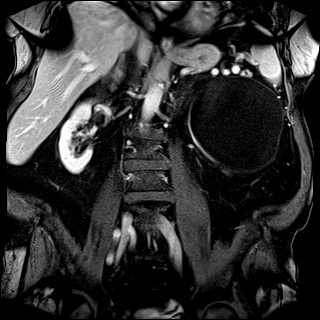
[im 72/72]
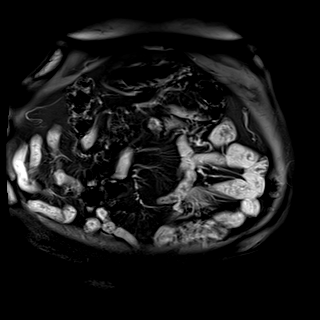

[Series 20: T1 dynamic fat-sat post-contrast · axial · 3.0mm · 1.25mm/px · z∈[-252,+9]mm · 3 of 88 slices shown (4 of 4)]
[im 1/88]
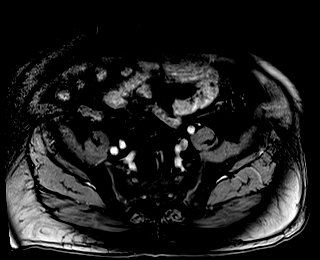
[im 44/88]
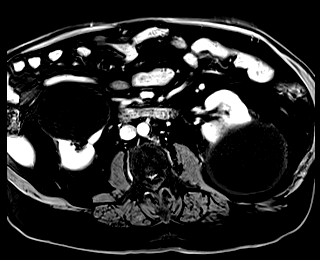
[im 88/88]
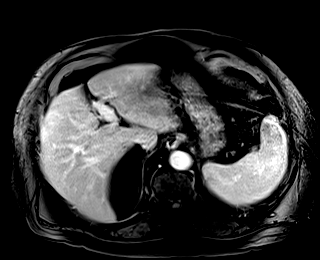

[Series 21: T1 dynamic fat-sat · axial · 3.0mm · 1.25mm/px · z∈[-252,+9]mm · 3 of 88 slices shown (5 of 5)]
[im 1/88]
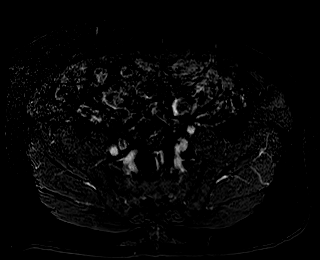
[im 44/88]
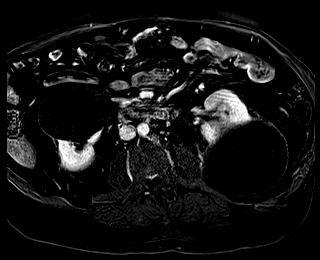
[im 88/88]
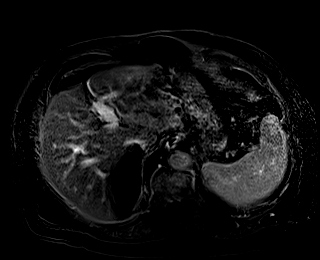

[48 of 48 positions shown; findings below may reference images not displayed]

FINDINGS: Lower chest: Unremarkable.

Hepatobiliary: Liver incompletely visualized on postcontrast imaging
with stable tiny hypervascular lesion in the subcapsular inferior
right liver, most likely benign and probably representing AVM or
hemangioma. Tiny gallstones evident. No intrahepatic or extrahepatic
biliary dilation.

Pancreas: No focal mass lesion. No dilatation of the main duct. No
intraparenchymal cyst. No peripancreatic edema.

Spleen:  No splenomegaly. No focal mass lesion.

Adrenals/Urinary Tract: No adrenal nodule or mass. Large bilateral
simple renal cysts again noted measuring up to 9.1 cm on the right
and 13.7 cm on the left. Other smaller simple cysts are again noted
in both kidneys.

Lesion of concern again identified in the upper upper pole right
kidney, measuring 2.4 x 2.4 x 2.6 cm today compared to 2.1 x 2.2 x
2.7 cm previously. As on prior studies, this lesion shows
heterogeneous enhancement after IV contrast administration. Right
renal vein is patent.

No new suspicious enhancing renal abnormality identified on either
side.

Stomach/Bowel: Stomach is unremarkable. No gastric wall thickening.
No evidence of outlet obstruction. Duodenum is normally positioned
as is the ligament of Treitz. Duodenal diverticulum noted. No small
bowel or colonic dilatation within the visualized abdomen.

Vascular/Lymphatic: No abdominal aortic aneurysm. No abdominal
aortic atherosclerotic calcification. There is no gastrohepatic or
hepatoduodenal ligament lymphadenopathy. No retroperitoneal or
mesenteric lymphadenopathy.

Other:  No intraperitoneal free fluid.

Musculoskeletal: No focal suspicious marrow enhancement within the
visualized bony anatomy.
IMPRESSION: 1. Stable appearance of the 2.6 cm heterogeneous enhancing lesion in
the upper pole right kidney. Imaging features remain concerning for
renal cell carcinoma.
2. No evidence for metastatic disease in the abdomen.
3. Bilateral renal cysts without substantial change. No new
suspicious knee and sing lesion in either kidney.
4. Cholelithiasis.

## 2021-08-14 MED ORDER — GADOBUTROL 1 MMOL/ML IV SOLN
10.0000 mL | Freq: Once | INTRAVENOUS | Status: AC | PRN
  Administered 2021-08-14: 10 mL via INTRAVENOUS

## 2021-08-23 ENCOUNTER — Encounter: Payer: Self-pay | Admitting: Urology

## 2021-08-23 ENCOUNTER — Ambulatory Visit: Payer: Medicare HMO | Admitting: Urology

## 2021-08-23 ENCOUNTER — Other Ambulatory Visit: Payer: Self-pay

## 2021-08-23 VITALS — BP 167/71 | HR 73 | Ht 71.0 in | Wt 257.0 lb

## 2021-08-23 DIAGNOSIS — R399 Unspecified symptoms and signs involving the genitourinary system: Secondary | ICD-10-CM | POA: Diagnosis not present

## 2021-08-23 DIAGNOSIS — N2889 Other specified disorders of kidney and ureter: Secondary | ICD-10-CM | POA: Diagnosis not present

## 2021-08-23 NOTE — Progress Notes (Signed)
° °  08/23/2021 2:04 PM   Ryan Garcia [birth year] [id number]  Reason for visit: Follow up right renal mass, urinary symptoms  HPI: Comorbid 88 year old male with extensive cardiac history found to have a 2.6 cm right-sided enhancing upper pole renal mass concerning for RCC in 2021.  Renal function is normal with recent creatinine of 1.17, EGFR greater than 60.  He has been on active surveillance.  I personally viewed and interpreted his most recent MRI dated 08/14/2021 that shows a stable 2.6 cm cystic right-sided mass that is unchanged in size.  With his comorbidities, I recommended continuing active surveillance, and at this point with the stability over 2 years and his comorbidities, would be comfortable changing to ultrasound on a yearly basis.  Risk and benefits discussed at length.  In terms of his urinary symptoms he has nocturia 1-2 times per night, and some urgency of urination when he gets up from his recliner.  Otherwise when he is walking around during the day he does well and does not have significant urinary symptoms or urgency/urge incontinence.  At this point, as his symptoms are really specific only to when getting up from a recliner if he has waited too long to get to the restroom, I recommended holding off on any medications, and he is amenable.  RTC 1 year with renal ultrasound prior for active surveillance of right renal mass   [name], MD  [facility] [address], [place], [place] [address] [phone]

## 2021-08-30 DIAGNOSIS — Z794 Long term (current) use of insulin: Secondary | ICD-10-CM | POA: Diagnosis not present

## 2021-08-30 DIAGNOSIS — E1142 Type 2 diabetes mellitus with diabetic polyneuropathy: Secondary | ICD-10-CM | POA: Diagnosis not present

## 2021-08-30 DIAGNOSIS — E1159 Type 2 diabetes mellitus with other circulatory complications: Secondary | ICD-10-CM | POA: Diagnosis not present

## 2021-09-06 DIAGNOSIS — E1159 Type 2 diabetes mellitus with other circulatory complications: Secondary | ICD-10-CM | POA: Diagnosis not present

## 2021-09-06 DIAGNOSIS — Z794 Long term (current) use of insulin: Secondary | ICD-10-CM | POA: Diagnosis not present

## 2021-09-06 DIAGNOSIS — E1142 Type 2 diabetes mellitus with diabetic polyneuropathy: Secondary | ICD-10-CM | POA: Diagnosis not present

## 2021-09-07 ENCOUNTER — Telehealth: Payer: Self-pay

## 2021-09-07 NOTE — Chronic Care Management (AMB) (Signed)
? ? ?  Chronic Care Management ?Pharmacy Assistant  ? ?Name: Ryan Garcia  MRN: [id number] DOB: Jan 07, 1934 ? ? ?Reason for Encounter: Reminder Call ?  ?Conditions to be addressed/monitored: ?CAD, HTN, HLD, and DMII ? ? ? ?Medications: ?Outpatient Encounter Medications as of 09/07/2021  ?Medication Sig  ? acetaminophen (TYLENOL) 325 MG tablet Take 325-650 mg by mouth every 6 (six) hours as needed for mild pain or headache.  ? Alcohol Swabs (B-D SINGLE USE SWABS REGULAR) PADS Use to check blood sugar 2 times a day  ? apixaban (ELIQUIS) 5 MG TABS tablet Take 1 tablet (5 mg total) by mouth 2 (two) times daily.  ? aspirin 81 MG tablet Take 81 mg by mouth daily.  ? Blood Glucose Calibration (TRUE METRIX LEVEL 1) Low SOLN Use to check control on glucose meter  ? Blood Glucose Monitoring Suppl (TRUE METRIX AIR GLUCOSE METER) w/Device KIT 1 kit by Other route 2 (two) times daily. Check blood sugar twice daily and as directed. Dx E11.65  ? cetirizine (ZYRTEC) 10 MG tablet TAKE 1 TABLET EVERY DAY  ? Coenzyme Q10 (CO Q 10 PO) Take 200 mg by mouth daily.  ? Cyanocobalamin (VITAMIN B12) 1000 MCG TBCR Take 1,000 mcg by mouth daily.  ? docusate sodium (COLACE) 100 MG capsule Take 100 mg by mouth 2 (two) times daily.  ? fluticasone (FLONASE) 50 MCG/ACT nasal spray Place 1 spray into both nostrils daily as needed for allergies.   ? glipiZIDE (GLUCOTROL) 10 MG tablet TAKE 1 TABLET TWICE DAILY WITH MEALS  ? glucose blood (TRUE METRIX BLOOD GLUCOSE TEST) test strip CHECK BLOOD SUGAR TWICE DAILY  ? Hydrocortisone, Perianal, (PROCTO-PAK) 1 % CREA Apply 1 inch topically 2 (two) times daily.  ? insulin glargine (LANTUS SOLOSTAR) 100 UNIT/ML Solostar Pen INJECT 40 UNITS INTO THE SKIN AT BEDTIME.  ? Insulin Pen Needle (DROPLET PEN NEEDLES) 31G X 6 MM MISC USE ONE TIME DAILY  AT  BEDTIME  WITH  LANTUS  ? isosorbide mononitrate (IMDUR) 60 MG 24 hr tablet TAKE 1 TABLET EVERY DAY  ? metFORMIN (GLUCOPHAGE) 1000 MG tablet TAKE 1 TABLET TWICE DAILY WITH  MEALS  ? metoprolol tartrate (LOPRESSOR) 25 MG tablet TAKE 1/2 TABLET TWICE DAILY  ? nitroGLYCERIN (NITROSTAT) 0.4 MG SL tablet Place 1 tablet (0.4 mg total) under the tongue every 5 (five) minutes as needed for chest pain.  ? rosuvastatin (CRESTOR) 40 MG tablet Take 1 tablet (40 mg total) by mouth daily.  ? TRUEplus Lancets 30G MISC Check blood sugar twice daily and as directed. Dx E11.65  ? ?No facility-administered encounter medications on file as of 09/07/2021.  ? ?Ryan Garcia did not answer the telephone  to remind of upcoming telephone visit with Charlene Brooke on 09/12/21 at 8:45am. Patient was reminded to have any blood glucose and blood pressure readings available for review at appointment. If unable to reach, a voicemail was left for patient.  ? ? ? ?Star Rating Drugs: ?Medication:  Last Fill: Day Supply ?Lantus   07/04/21  90 ?Metformin $RemoveBefor'1000mg'[id number]$  08/13/21 90 ?Rosuvastatin $RemoveBeforeDE'40mg'[id number]$  06/28/21 90 ?Glipizide $RemoveBefor'10mg'[id number]$  06/28/21 90 ? ?Charlene Brooke, CPP notified ? ?Lasonya Hubner, CCMA ?Health concierge  ?[phone]  ?

## 2021-09-12 ENCOUNTER — Ambulatory Visit (INDEPENDENT_AMBULATORY_CARE_PROVIDER_SITE_OTHER): Payer: Medicare HMO | Admitting: Pharmacist

## 2021-09-12 ENCOUNTER — Other Ambulatory Visit: Payer: Self-pay

## 2021-09-12 DIAGNOSIS — I10 Essential (primary) hypertension: Secondary | ICD-10-CM

## 2021-09-12 DIAGNOSIS — I4891 Unspecified atrial fibrillation: Secondary | ICD-10-CM

## 2021-09-12 DIAGNOSIS — E1169 Type 2 diabetes mellitus with other specified complication: Secondary | ICD-10-CM

## 2021-09-12 DIAGNOSIS — E1165 Type 2 diabetes mellitus with hyperglycemia: Secondary | ICD-10-CM

## 2021-09-12 DIAGNOSIS — I25119 Atherosclerotic heart disease of native coronary artery with unspecified angina pectoris: Secondary | ICD-10-CM

## 2021-09-12 NOTE — Progress Notes (Signed)
? ?Chronic Care Management ?Pharmacy Note ? ?09/12/2021 ?Name:  Ryan Garcia MRN:  [id number] DOB:  [birth year] ? ?Summary: CCM F/U visit ?-BG control improving - Pt saw Endocrine last week (A1c up to 8.5%) and insulin was increased, BG has bee improvign since: pt reports fasting BG 81-123 and post-prandial BG 129-268; he denies hypoglycemia ?-BP control improving - BP 138/78 at recent office visit; pt is not checking at home regularly ?-Pt is due for annual visit with PCP (last OV 10/26/2020) ? ?Recommendations/Changes made from today's visit: ?-No med changes ?-Recommended scheduling PCP annual visit in April 2023 ? ?Plan: ?-[place] will call patient 3 months for BG log ?-Pharmacist follow up televisit scheduled for 6 months ? ? ? ?Subjective: ?Ryan Garcia is an 88 y.o. year old male who is a primary patient of Tower, Wynelle Fanny, MD.  The CCM team was consulted for assistance with disease management and care coordination needs.   ? ?Engaged with patient by telephone for follow up visit in response to provider referral for pharmacy case management and/or care coordination services.  ? ?Consent to Services:  ?The patient was given information about Chronic Care Management services, agreed to services, and gave verbal consent prior to initiation of services.  Please see initial visit note for detailed documentation.  ? ?Patient Care Team: ?Tower, Wynelle Fanny, MD as PCP - General ?Lelon Perla, MD as PCP - Cardiology (Cardiology) ?Leandrew Koyanagi, MD as Referring Physician (Ophthalmology) ?Lelon Perla, MD as Consulting Physician (Cardiology) ?Dasher, Rayvon Char, MD as Consulting Physician (Dermatology) ?Salomon Fick., MD as Referring Physician (Dentistry) ?Johnaton Sonneborn, Cleaster Cor[place], [facility] as Pharmacist (Pharmacist) ? ?Recent office visits: ?06/29/21 LPN Tamina McCain - AWV ? ?10/26/20 Dr Glori Bickers OV: hospital f/u; A1c up, referred to endocrine. Cr improved but not at baseline yet. ? ?Recent consult  visits: ?09/06/21 Dr Gabriel Carina (Endocrine): f/u DM. Adjust Lantus to 50 units HS. Adust Novolog to 16 units before supper. Last eye exam 08/2020.  ? ?08/23/21 Dr Diamantina Providence (Urology): f/u renal mass, stable per MRI. No med changes. ?07/31/21 Dr Evorn Gong (Dermatology): f/u SCC. Biopsy. ? ?05/24/21 Dr Gabriel Carina (Endocrine): f/u DM. Stop Ozempic due to cost. Start Novolog 10 units with supper. ? ?Hospital visits: ?04/28/21 ED visit - rectal bleeding. HGB WNL. Likely anal fissure/hemorrhoid. D/C w/ hydrocortisone cream. Referred to GI. ? ? ?Objective: ? ?Lab Results  ?Component Value Date  ? CREATININE 1.17 04/28/2021  ? BUN 13 04/28/2021  ? GFR 58.98 (L) 11/02/2020  ? GFRNONAA >60 04/28/2021  ? GFRAA >60 07/03/2018  ? NA 140 04/28/2021  ? K 4.1 04/28/2021  ? CALCIUM 8.9 04/28/2021  ? CO2 25 04/28/2021  ? GLUCOSE 252 (H) 04/28/2021  ? ? ?Lab Results  ?Component Value Date/Time  ? HGBA1C 8.0 (H) 10/11/2020 10:11 PM  ? HGBA1C 7.3 (A) 07/26/2020 11:47 AM  ? HGBA1C 7.0 (H) 04/11/2020 06:04 PM  ? GFR 58.98 (L) 11/02/2020 10:39 AM  ? GFR 53.32 (L) 10/26/2020 10:27 AM  ? MICROALBUR 3.3 (H) 05/09/2010 08:21 AM  ? MICROALBUR 1.8 11/21/2007 08:46 AM  ?  ?Last diabetic Eye exam:  ?Lab Results  ?Component Value Date/Time  ? HMDIABEYEEXA No Retinopathy 07/24/2019 12:00 AM  ?  ?Last diabetic Foot exam:  ?Lab Results  ?Component Value Date/Time  ? HMDIABFOOTEX yes 09/06/2010 12:00 AM  ?  ? ?Lab Results  ?Component Value Date  ? CHOL 113 10/12/2020  ? HDL 41 10/12/2020  ? [place] 57 10/12/2020  ? LDLDIRECT  78.5 11/17/2012  ? TRIG 73 10/12/2020  ? CHOLHDL 2.8 10/12/2020  ? ? ?Hepatic Function Latest Ref Rng & Units 10/11/2020 05/25/2020 04/20/2020  ?Total Protein 6.5 - 8.1 g/dL 4.9(L) 6.5 6.1  ?Albumin 3.5 - 5.0 g/dL 2.8(L) 3.9 3.5  ?AST 15 - 41 U/L 30 17 35  ?ALT 0 - 44 U/L 14 23 98(H)  ?Alk Phosphatase 38 - 126 U/L 54 78 173(H)  ?Total Bilirubin 0.3 - 1.2 mg/dL 0.7 0.7 0.8  ?Bilirubin, Direct 0.0 - 0.3 mg/dL - 0.2 0.3  ? ? ?Lab Results  ?Component Value  Date/Time  ? TSH 8.470 (H) 10/12/2020 08:54 AM  ? TSH 5.58 (H) 01/18/2020 10:10 AM  ? TSH 4.23 07/17/2019 08:56 AM  ? FREET4 0.74 10/12/2020 08:54 AM  ? FREET4 0.71 01/14/2018 08:57 AM  ? ? ?CBC Latest Ref Rng & Units 04/28/2021 10/13/2020 10/12/2020  ?WBC 4.0 - 10.5 K/uL 7.0 8.0 6.9  ?Hemoglobin 13.0 - 17.0 g/dL 14.5 14.4 14.5  ?Hematocrit 39.0 - 52.0 % 43.6 43.1 44.8  ?Platelets 150 - 400 K/uL 160 158 144(L)  ? ? ?No results found for: VD25OH ? ?Clinical ASCVD: Yes  ?The ASCVD Risk score (Arnett DK, et al., 2019) failed to calculate for the following reasons: ?  The 2019 ASCVD risk score is only valid for ages 69 to 26 ?  The patient has a prior MI or stroke diagnosis   ? ?Depression screen [facility] 2/9 06/29/2021 01/18/2020 01/16/2019  ?Decreased Interest 0 0 0  ?Down, Depressed, Hopeless 0 0 0  ?PHQ - 2 Score 0 0 0  ?Altered sleeping - - -  ?Tired, decreased energy - - -  ?Change in appetite - - -  ?Feeling bad or failure about yourself  - - -  ?Trouble concentrating - - -  ?Moving slowly or fidgety/restless - - -  ?Suicidal thoughts - - -  ?PHQ-9 Score - - -  ?Difficult doing work/chores - - -  ?Some recent data might be hidden  ?  ?CHA2DS2/VAS Stroke Risk Points  Current as of yesterday  ?   5 >= 2 Points: High Risk  ?1 - 1.99 Points: Medium Risk  ?0 Points: Low Risk  ?  No Change   ? ? Points Metrics  ?0 Has Congestive Heart Failure:  No   ? Current as of yesterday  ?1 Has Vascular Disease:  Yes   ? Current as of yesterday  ?1 Has Hypertension:  Yes   ? Current as of yesterday  ?2 Age:  13   ? Current as of yesterday  ?1 Has Diabetes:  Yes   ? Current as of yesterday  ?0 Had Stroke:  No  Had TIA:  No  Had Thromboembolism:  No   ? Current as of yesterday  ?0 Male:  No   ? Current as of yesterday  ? ? ?Social History  ? ?Tobacco Use  ?Smoking Status Never  ? Passive exposure: Never  ?Smokeless Tobacco Never  ? ?BP Readings from Last 3 Encounters:  ?08/23/21 (!) 167/71  ?04/28/21 (!) 152/69  ?10/26/20 (!) 164/76   ? ?Pulse Readings from Last 3 Encounters:  ?08/23/21 73  ?04/28/21 64  ?10/26/20 61  ? ?Wt Readings from Last 3 Encounters:  ?08/23/21 257 lb (116.6 kg)  ?06/29/21 256 lb (116.1 kg)  ?04/28/21 258 lb (117 kg)  ? ?BMI Readings from Last 3 Encounters:  ?08/23/21 35.84 kg/m?  ?06/29/21 35.70 kg/m?  ?04/28/21 35.98 kg/m?  ? ? ?  Assessment/Interventions: Review of patient past medical history, allergies, medications, health status, including review of consultants reports, laboratory and other test data, was performed as part of comprehensive evaluation and provision of chronic care management services.  ? ?SDOH:  (Social Determinants of Health) assessments and interventions performed: No - recently assessed at [facility] 06/2021 ? ?SDOH Screenings  ? ?Alcohol Screen: Low Risk   ? Last Alcohol Screening Score (AUDIT): 0  ?Depression (PHQ2-9): Low Risk   ? PHQ-2 Score: 0  ?Financial Resource Strain: Low Risk   ? Difficulty of Paying Living Expenses: Not hard at all  ?Food Insecurity: No Food Insecurity  ? Worried About Charity fundraiser in the Last Year: Never true  ? Ran Out of Food in the Last Year: Never true  ?Housing: Low Risk   ? Last Housing Risk Score: 0  ?Physical Activity: Inactive  ? Days of Exercise per Week: 0 days  ? Minutes of Exercise per Session: 0 min  ?Social Connections: Moderately Integrated  ? Frequency of Communication with Friends and Family: More than three times a week  ? Frequency of Social Gatherings with Friends and Family: More than three times a week  ? Attends Religious Services: More than 4 times per year  ? Active Member of Clubs or Organizations: No  ? Attends Archivist Meetings: Never  ? Marital Status: Married  ?Stress: No Stress Concern Present  ? Feeling of Stress : Not at all  ?Tobacco Use: Low Risk   ? Smoking Tobacco Use: Never  ? Smokeless Tobacco Use: Never  ? Passive Exposure: Never  ?Transportation Needs: No Transportation Needs  ? Lack of Transportation (Medical): No  ?  Lack of Transportation (Non-Medical): No  ? ? ?CCM Care Plan ? ?Allergies  ?Allergen Reactions  ? Loratadine Other (See Comments)  ?  Not effective ?Other reaction(s): Other (See Comments) ?Not effective ?Not e

## 2021-09-12 NOTE — Patient Instructions (Signed)
Visit Information ? ?Phone number for Pharmacist: [phone] ? ? Goals Addressed   ? ?  ?  ?  ?  ? This Visit's Progress  ?  Monitor and Manage My Blood Sugar-Diabetes Type 2     ?  Timeframe:  Long-Range Goal ?Priority:  High ?Start Date:     09/12/21                        ?Expected End Date:    09/13/22                  ? ?Follow Up Date: Sept 2023 ?  ?- check blood sugar at prescribed times ?- check blood sugar if I feel it is too high or too low ?- enter blood sugar readings and medication or insulin into daily log ?- take the blood sugar log to all doctor visits  ?  ?Why is this important?   ?Checking your blood sugar at home helps to keep it from getting very high or very low.  ?Writing the results in a diary or log helps the doctor know how to care for you.  ?Your blood sugar log should have the time, date and the results.  ?Also, write down the amount of insulin or other medicine that you take.  ?Other information, like what you ate, exercise done and how you were feeling, will also be helpful.   ?  ?Notes:  ?  ? ?  ? ? ?Care Plan : [place]  ?Updates made by Charlton Haws, RPH since 09/12/2021 12:00 AM  ?  ? ?Problem: Hypertension, Hyperlipidemia, Diabetes, Atrial Fibrillation, and Coronary Artery Disease   ?Priority: High  ?  ? ?Goal: Disease Management   ?Start Date: 08/31/2020  ?Expected End Date: 09/13/2022  ?This Visit's Progress: On track  ?Priority: High  ?Note:   ?Current Barriers:  ?Unable to independently monitor therapeutic efficacy ? ?Pharmacist Clinical Goal(s):  ?Patient will achieve adherence to monitoring guidelines and medication adherence to achieve therapeutic efficacy through collaboration with PharmD and provider.  ? ?Interventions: ?1:1 collaboration with Tower, Wynelle Fanny, MD regarding development and update of comprehensive plan of care as evidenced by provider attestation and co-signature ?Inter-disciplinary care team collaboration (see longitudinal plan of  care) ?Comprehensive medication review performed; medication list updated in electronic medical record ? ?Hypertension (BP goal < 140/90) ?-Controlled- BP 138/78 at recent office visit ?-Current home BP readings: 110/81, HR 83 ?-Current treatment: ?Metoprolol tartate 25 mg - 1/2 BID - Appropriate, Effective, Safe, Accessible ?Isosorbide Mononitrate 60 mg daily AM - Appropriate, Effective, Safe, Accessible ?-Medications previously tried: HCTZ, lisinopril - held due to elevated creatinine  ?-Current exercise habits:  Mowing lawn (13 acres); cutting logs ?-Educated on Importance of home blood pressure monitoring; Proper BP monitoring technique; ?-Counseled to monitor BP at home every other day ?-Recommended to continue current medication ? ?Atrial Fibrillation (Goal: prevent stroke and major bleeding) ?-Controlled - per pt report ?-CHADSVASC: 5 ?-Current treatment: ?Metoprolol tartrate 25 mg - 1/2 tab BID - Appropriate, Effective, Safe, Accessible ?Eliquis 5 mg BID - Appropriate, Effective, Safe, Accessible ?-Medications previously tried: n/a ?-Counseled on increased risk of stroke due to Afib and benefits of anticoagulation for stroke prevention; importance of adherence to anticoagulant exactly as prescribed; ?-Recommended to continue current medication ? ?Hyperlipidemia/CAD: (LDL goal < 70) ?-Controlled - LDL 57; pt reports he had cramps with statin previously and COQ10 has helped ?-Hx NSTEMI 06/2017 ?-Current treatment: ?Rosuvastatin  40 mg daily - Appropriate, Effective, Safe, Accessible ?Aspirin 81 mg daily - Appropriate, Effective, Safe, Accessible ?CoQ10 daily - Appropriate, Effective, Safe, Accessible ?Nitroglycerin 0.4 mg SL prn - Appropriate, Effective, Safe, Accessible ?-Medications previously tried: Lipitor (myopathy), pravastatin, simvastatin ?-Educated on Benefits of statin for ASCVD risk reduction; ?-Recommended to continue current medication ? ?Diabetes (A1c goal < 7.5%) ?-Uncontrolled, improving -  A1c  8.5% (08/2021), pt reports he "went wild" with eating around Christmas; insulin adjusments were made last week and it appears home BG readings have improved ?-Follows with [facility] (Dr Gabriel Carina) ?-Current home glucose readings: checks before breakfast and 2-4 hours after supper  ?fasting: 114, 113, 123, 114, 81, 99 ?post prandial: 268, 237, 129, 175, 232 ?-Current medications: ?Metformin 1000 mg BID w/ meals - Appropriate, Effective, Safe, Accessible ?Lantus 50 units HS - Appropriate, Effective, Safe, Accessible ?Novolog 16 units w/ supper - Appropriate, Effective, Safe, Accessible ?Glipizide 10 mg BID w/ meals - Appropriate, Effective, Safe, Accessible ?-Medications previously tried: Ozempic (cost) ?-Educated on A1c and blood sugar goals; Benefits of routine self-monitoring of blood sugar; ?-Recommended to continue current medication;  ? ?Patient Goals/Self-Care Activities ?Patient will:  ?- take medications as prescribed as evidenced by patient report and record review ?focus on medication adherence by routine ?check glucose twice daily, document, and provide at future appointments ?check blood pressure 2-3x weekly, document, and provide at future appointments ?  ?  ? ?Patient verbalizes understanding of instructions and care plan provided today and agrees to view in [place]. Active MyChart status confirmed with patient.   ?Telephone follow up appointment with pharmacy team member scheduled for: 6 months ? ?Charlene Brooke, PharmD, BCACP ?Clinical Pharmacist ?[facility] ?[phone] ?  ?

## 2021-09-13 ENCOUNTER — Other Ambulatory Visit: Payer: Self-pay | Admitting: Cardiology

## 2021-09-22 ENCOUNTER — Encounter: Payer: Self-pay | Admitting: Family Medicine

## 2021-09-22 ENCOUNTER — Ambulatory Visit (INDEPENDENT_AMBULATORY_CARE_PROVIDER_SITE_OTHER): Payer: Medicare HMO | Admitting: Family Medicine

## 2021-09-22 ENCOUNTER — Other Ambulatory Visit: Payer: Self-pay

## 2021-09-22 VITALS — BP 126/70 | HR 83 | Temp 97.8°F | Ht 70.5 in | Wt 251.5 lb

## 2021-09-22 DIAGNOSIS — Z0000 Encounter for general adult medical examination without abnormal findings: Secondary | ICD-10-CM | POA: Diagnosis not present

## 2021-09-22 DIAGNOSIS — I4891 Unspecified atrial fibrillation: Secondary | ICD-10-CM

## 2021-09-22 DIAGNOSIS — E785 Hyperlipidemia, unspecified: Secondary | ICD-10-CM

## 2021-09-22 DIAGNOSIS — E669 Obesity, unspecified: Secondary | ICD-10-CM | POA: Diagnosis not present

## 2021-09-22 DIAGNOSIS — I10 Essential (primary) hypertension: Secondary | ICD-10-CM | POA: Diagnosis not present

## 2021-09-22 DIAGNOSIS — N400 Enlarged prostate without lower urinary tract symptoms: Secondary | ICD-10-CM | POA: Diagnosis not present

## 2021-09-22 DIAGNOSIS — E038 Other specified hypothyroidism: Secondary | ICD-10-CM

## 2021-09-22 DIAGNOSIS — E1165 Type 2 diabetes mellitus with hyperglycemia: Secondary | ICD-10-CM

## 2021-09-22 DIAGNOSIS — E1169 Type 2 diabetes mellitus with other specified complication: Secondary | ICD-10-CM | POA: Diagnosis not present

## 2021-09-22 LAB — LIPID PANEL
Cholesterol: 112 mg/dL (ref 0–200)
HDL: 37.7 mg/dL — ABNORMAL LOW (ref >39.00)
LDL Cholesterol: 57 mg/dL (ref 0–99)
NonHDL: 74.16
Total CHOL/HDL Ratio: 3
Triglycerides: 88 mg/dL (ref 0.0–149.0)
VLDL: 17.6 mg/dL (ref 0.0–40.0)

## 2021-09-22 LAB — COMPREHENSIVE METABOLIC PANEL
ALT: 16 U/L (ref 0–53)
AST: 15 U/L (ref 0–37)
Albumin: 3.9 g/dL (ref 3.5–5.2)
Alkaline Phosphatase: 70 U/L (ref 39–117)
BUN: 22 mg/dL (ref 6–23)
CO2: 27 mEq/L (ref 19–32)
Calcium: 8.8 mg/dL (ref 8.4–10.5)
Chloride: 109 mEq/L (ref 96–112)
Creatinine, Ser: 1.16 mg/dL (ref 0.40–1.50)
GFR: 57.4 mL/min — ABNORMAL LOW (ref >60.00)
Glucose, Bld: 133 mg/dL — ABNORMAL HIGH (ref 70–99)
Potassium: 4.2 mEq/L (ref 3.5–5.1)
Sodium: 143 mEq/L (ref 135–145)
Total Bilirubin: 0.6 mg/dL (ref 0.2–1.2)
Total Protein: 6.4 g/dL (ref 6.0–8.3)

## 2021-09-22 LAB — CBC WITH DIFFERENTIAL/PLATELET
Basophils Absolute: 0 10*3/uL (ref 0.0–0.1)
Basophils Relative: 0.6 % (ref 0.0–3.0)
Eosinophils Absolute: 0.2 10*3/uL (ref 0.0–0.7)
Eosinophils Relative: 3.8 % (ref 0.0–5.0)
HCT: 39.1 % (ref 39.0–52.0)
Hemoglobin: 13.1 g/dL (ref 13.0–17.0)
Lymphocytes Relative: 28.4 % (ref 12.0–46.0)
Lymphs Abs: 1.9 10*3/uL (ref 0.7–4.0)
MCHC: 33.5 g/dL (ref 30.0–36.0)
MCV: 90.3 fl (ref 78.0–100.0)
Monocytes Absolute: 0.8 10*3/uL (ref 0.1–1.0)
Monocytes Relative: 11.8 % (ref 3.0–12.0)
Neutro Abs: 3.6 10*3/uL (ref 1.4–7.7)
Neutrophils Relative %: 55.4 % (ref 43.0–77.0)
Platelets: 168 10*3/uL (ref 150.0–400.0)
RBC: 4.33 Mil/uL (ref 4.22–5.81)
RDW: 16.3 % — ABNORMAL HIGH (ref 11.5–15.5)
WBC: 6.6 10*3/uL (ref 4.0–10.5)

## 2021-09-22 NOTE — Progress Notes (Signed)
? ?Subjective:  ? ? Patient ID: EMMERT ROETHLER, male    DOB: [birth year], 89 y.o.   MRN: [id number] ? ?This visit occurred during the SARS-CoV-2 public health emergency.  Safety protocols were in place, including screening questions prior to the visit, additional usage of staff PPE, and extensive cleaning of exam room while observing appropriate contact time as indicated for disinfecting solutions.  ? ?HPI ?Here for health maintenance exam and to review chronic medical problems   ? ?Wt Readings from Last 3 Encounters:  ?09/22/21 251 lb 8 oz (114.1 kg)  ?08/23/21 257 lb (116.6 kg)  ?06/29/21 256 lb (116.1 kg)  ? ?35.58 kg/m? ? ?Very busy  ?Working outdoors a lot  ? ?Feeling about average  ? ?Zoster status : unsure if interested  ? ?Flu shot :   got it in the fall at [facility]  ?Covid vacccinated  ?Pna vaccines utd  ?Td 08/2020 ? ?Colon cancer screening-out aged  ?Had pos cologuard but not safe candidate for colonoscopy in the past  ? ?BPH: has to urinate 5 times per day    ?1-2 times per night  ?Out aged screening  ?Lab Results  ?Component Value Date  ? PSA 1.93 01/18/2020  ? PSA 2.02 01/14/2019  ? PSA 2.25 09/07/2016  ? ?  ? ? ?HTN  (with afib and CAD) ?bp is stable today  ?No cp or palpitations or headaches or edema  ?No side effects to medicines  ?BP Readings from Last 3 Encounters:  ?09/22/21 126/70  ?08/23/21 (!) 167/71  ?04/28/21 (!) 152/69  ?   ?Metoprolol 12.5 mg bid  ?Imdur 60 mg daily  ? ?Pulse Readings from Last 3 Encounters:  ?09/22/21 83  ?08/23/21 73  ?04/28/21 64  ? ? ? ?Lab Results  ?Component Value Date  ? CREATININE 1.17 04/28/2021  ? BUN 13 04/28/2021  ? NA 140 04/28/2021  ? K 4.1 04/28/2021  ? CL 105 04/28/2021  ? CO2 25 04/28/2021  ? ?DM2 Dr Gabriel Carina  ?Endocrinology care ?Last a1c 8.5% ?Novolog and lantus (inc to 50)  ? ?[facility] exam 08/2020 at [facility]  ?Has it scheduled next week ? ?Does not eat perfectly  ?Is eating less and eliminate snacks in the evening for a month  ? ? ?Hyperlipidemia  ?Crestor 40  mg daily  ?Lab Results  ?Component Value Date  ? CHOL 113 10/12/2020  ? HDL 41 10/12/2020  ? [place] 57 10/12/2020  ? LDLDIRECT 78.5 11/17/2012  ? TRIG 73 10/12/2020  ? CHOLHDL 2.8 10/12/2020  ? ? ?Subclinical hypothyroidism ?No clinical changes  ?Lab Results  ?Component Value Date  ? TSH 8.470 (H) 10/12/2020  ? ?Patient Active Problem List  ? Diagnosis Date Noted  ? Atrial flutter with rapid ventricular response ([place])   ? Rapid atrial fibrillation ([place]) 10/11/2020  ? Poor balance 07/26/2020  ? Anemia 04/20/2020  ? Renal mass, right 04/18/2020  ? AKI (acute kidney injury) ([place]) 04/14/2020  ? Cholangitis 04/14/2020  ? Unstable angina (HCC)   ? Chest pain 04/11/2020  ? Coronary artery disease involving native coronary artery of native heart without angina pectoris 01/18/2019  ? Medicare annual wellness visit, subsequent 01/16/2019  ? Obesity (BMI 30-39.9) 07/09/2018  ? HOH (hard of hearing) 03/31/2018  ? Uncontrolled type 2 diabetes mellitus with hyperglycemia ([place]) 10/28/2017  ? Subclinical hypothyroidism 10/28/2017  ? Scoliosis   ? Obstructive sleep apnea on CPAP   ? Hx of skin cancer, basal cell   ? Hyperlipidemia associated with  type 2 diabetes mellitus ([place])   ? GERD (gastroesophageal reflux disease)   ? Chronic upper back pain   ? Arthritis   ? Allergy   ? NSTEMI (non-ST elevated myocardial infarction) ([place]) 06/18/2017  ? Actinic keratoses 04/29/2017  ? Positive colorectal cancer screening using Cologuard test 10/31/2016  ? BPH (benign prostatic hyperplasia) 09/04/2016  ? B12 deficiency 11/22/2015  ? Routine general medical examination at a health care facility 08/26/2015  ? Abnormal nuclear cardiac imaging test   ? Dyspnea on exertion 11/27/2013  ? Encounter for examination of normal volunteer in research study 05/29/2013  ? Prostate cancer screening 07/15/2012  ? CAD S/P percutaneous coronary angioplasty 05/17/2011  ? Nonspecific abnormal results of cardiovascular function study 04/30/2011  ? Abnormal EKG  04/06/2011  ? Right low back pain 01/13/2010  ? Insulin dependent diabetes mellitus 10/04/2006  ? ERECTILE DYSFUNCTION 10/04/2006  ? Primary hypertension 10/04/2006  ? Allergic rhinitis 10/04/2006  ? OSTEOARTHRITIS 10/04/2006  ? Sleep apnea 10/04/2006  ? EDEMA 10/04/2006  ? SKIN CANCER, HX OF 10/04/2006  ? ?Past Medical History:  ?Diagnosis Date  ? AC (acromioclavicular) joint bone spurs   ? lt ankle  ? Allergy   ? allergic rhinitis  ? Anginal pain ([place])   ? Arthritis   ? OA  ? BPH (benign prostatic hyperplasia)   ? microwave tx of prostate  ? CAD (coronary artery disease)   ? a. s/p cath in 2012 showing 70-75% LAD stenosis, 75% D1 stenosis, 75% OM1, 60-70% RCA stenosis, and 80% PDA --> medical therapy pursued b. similar findings by cath in 2016; 06/2017 DES to distal RCA  ? Chronic kidney disease   ? Chronic upper back pain   ? Diabetes mellitus   ? type II x8 years  ? GERD (gastroesophageal reflux disease)   ? HLD (hyperlipidemia)   ? 10/12: TC 208, TG 213, HDL 36, LDL 129  ? Hx of skin cancer, basal cell   ? many skin cancers removed/under constant treatment.  ? Hypertension   ? NSTEMI (non-ST elevated myocardial infarction) ([place]) 06/18/2017  ? DES to distal RCA  ? Obstructive sleep apnea on CPAP   ? Scoliosis   ? ?Past Surgical History:  ?Procedure Laterality Date  ? BREAST SURGERY  2009  ? breast lump removed benign  ? BUBBLE STUDY  10/14/2020  ? Procedure: BUBBLE STUDY;  Surgeon: [name], MD;  Location: [place];  Service: Cardiovascular;;  ? CARDIAC CATHETERIZATION N/A 03/15/2015  ? Procedure: Left Heart Cath and Coronary Angiography;  Surgeon: Belva Crome, MD;  Location: [place] CV LAB;  Service: Cardiovascular;  Laterality: N/A;  ? CARDIOVERSION N/A 10/14/2020  ? Procedure: CARDIOVERSION;  Surgeon: [name], MD;  Location: [facility] ENDOSCOPY;  Service: Cardiovascular;  Laterality: N/A;  ? CORONARY STENT INTERVENTION N/A 06/18/2017  ? Procedure: CORONARY STENT INTERVENTION;  Surgeon:  Jettie Booze, MD;  Location: [place] CV LAB;  Service: Cardiovascular;  Laterality: N/A;  RCA  ? LEFT HEART CATH AND CORONARY ANGIOGRAPHY N/A 06/18/2017  ? Procedure: LEFT HEART CATH AND CORONARY ANGIOGRAPHY;  Surgeon: Jettie Booze, MD;  Location: [place] CV LAB;  Service: Cardiovascular;  Laterality: N/A;  ? LEFT HEART CATH AND CORONARY ANGIOGRAPHY N/A 04/12/2020  ? Procedure: LEFT HEART CATH AND CORONARY ANGIOGRAPHY;  Surgeon: Martinique, Peter M, MD;  Location: [place] CV LAB;  Service: Cardiovascular;  Laterality: N/A;  ? TEE WITHOUT CARDIOVERSION N/A 10/14/2020  ? Procedure: TRANSESOPHAGEAL ECHOCARDIOGRAM (TEE);  Surgeon: [name], MD;  Location: [place];  Service: Cardiovascular;  Laterality: N/A;  ? ULTRASOUND GUIDANCE FOR VASCULAR ACCESS  06/18/2017  ? Procedure: Ultrasound Guidance For Vascular Access;  Surgeon: Jettie Booze, MD;  Location: [place] CV LAB;  Service: Cardiovascular;;  right radial, right femoral  ? ?Social History  ? ?Tobacco Use  ? Smoking status: Never  ?  Passive exposure: Never  ? Smokeless tobacco: Never  ?Vaping Use  ? Vaping Use: Never used  ?Substance Use Topics  ? Alcohol use: No  ?  Alcohol/week: 0.0 standard drinks  ? Drug use: No  ? ?Family History  ?Problem Relation Age of Onset  ? Heart attack Brother   ? Colon cancer Neg Hx   ? Colon polyps Neg Hx   ? Stomach cancer Neg Hx   ? Esophageal cancer Neg Hx   ? ?Allergies  ?Allergen Reactions  ? Loratadine Other (See Comments)  ?  Not effective ?Other reaction(s): Other (See Comments) ?Not effective ?Not effective  ? Simvastatin Other (See Comments)  ?  Intolerant- caused joint pain ?Other reaction(s): Other (See Comments) ?Intolerant- caused joint pain ?Intolerant, joint pain  ? ?Current Outpatient Medications on File Prior to Visit  ?Medication Sig Dispense Refill  ? acetaminophen (TYLENOL) 325 MG tablet Take 325-650 mg by mouth every 6 (six) hours as needed for mild pain or  headache.    ? Alcohol Swabs (B-D SINGLE USE SWABS REGULAR) PADS Use to check blood sugar 2 times a day 200 each 3  ? apixaban (ELIQUIS) 5 MG TABS tablet Take 1 tablet (5 mg total) by mouth 2 (two) times daily.

## 2021-09-22 NOTE — Patient Instructions (Addendum)
If you are interested in the new shingles vaccine (Shingrix) - call your local pharmacy to check on coverage and availability  ?If affordable, get on a wait list at your pharmacy to get the vaccine. ? ?  ?Take care of yourself  ? ?Keep working on healthy diet  ? ?Labs today  ? ?If you develop any problems with the tick bite let us know  ?Watch for rash, redness, fever or any signs of illness  ? ? ? ?

## 2021-09-24 NOTE — Assessment & Plan Note (Signed)
Rate controlled with metoprolol 12.5 mg twice daily ?Under care of cardiology ?

## 2021-09-24 NOTE — Assessment & Plan Note (Signed)
Disc goals for lipids and reasons to control them ?Rev last labs with pt ?Rev low sat fat diet in detail ?Labs ordered  ?Takes crestor 40 mg daily , tolerates it well ?

## 2021-09-24 NOTE — Assessment & Plan Note (Signed)
Discussed how this problem influences overall health and the risks it imposes  Reviewed plan for weight loss with lower calorie diet (via better food choices and also portion control or program like weight watchers) and exercise building up to or more than 30 minutes 5 days per week including some aerobic activity    

## 2021-09-24 NOTE — Assessment & Plan Note (Signed)
No clinical changes 

## 2021-09-24 NOTE — Assessment & Plan Note (Signed)
Last a1c of 8.5  ?Under care of Dr Gabriel Carina for endocrinology  ?novolog and lantus  ?Good foot care  ?Eye exam is scheduled next week  ?Trying to eat a little better ?

## 2021-09-24 NOTE — Assessment & Plan Note (Signed)
No clinical changes TSH and FT4 ordered  

## 2021-09-24 NOTE — Assessment & Plan Note (Signed)
Reviewed health habits including diet and exercise and skin cancer prevention ?Reviewed appropriate screening tests for age  ?Also reviewed health mt list, fam hx and immunization status , as well as social and family history   ?See HPI ?Labs ordered ?Declines Shingrix vaccine ?Out aged colon cancer screening and prostate cancer screening ? ?

## 2021-09-24 NOTE — Assessment & Plan Note (Signed)
bp in fair control at this time  ?BP Readings from Last 1 Encounters:  ?09/22/21 126/70  ? ?No changes needed ?Most recent labs reviewed  ?Disc lifstyle change with low sodium diet and exercise  ?Plan to continue  ?Metoprolol 12.5 mg twice daily ?Imdur 60 mg daily ?Under care of cardiology with A-fib and coronary artery disease ?

## 2021-09-26 LAB — T4, FREE: Free T4: 0.68 ng/dL (ref 0.60–1.60)

## 2021-09-26 LAB — TSH: TSH: 8.32 u[IU]/mL — ABNORMAL HIGH (ref 0.35–5.50)

## 2021-09-27 ENCOUNTER — Encounter: Payer: Self-pay | Admitting: *Deleted

## 2021-09-29 DIAGNOSIS — E1165 Type 2 diabetes mellitus with hyperglycemia: Secondary | ICD-10-CM

## 2021-09-29 DIAGNOSIS — I119 Hypertensive heart disease without heart failure: Secondary | ICD-10-CM | POA: Diagnosis not present

## 2021-09-29 DIAGNOSIS — I4891 Unspecified atrial fibrillation: Secondary | ICD-10-CM

## 2021-09-29 DIAGNOSIS — E785 Hyperlipidemia, unspecified: Secondary | ICD-10-CM

## 2021-10-05 DIAGNOSIS — H524 Presbyopia: Secondary | ICD-10-CM | POA: Diagnosis not present

## 2021-10-05 DIAGNOSIS — H35372 Puckering of macula, left eye: Secondary | ICD-10-CM | POA: Diagnosis not present

## 2021-10-05 LAB — HM DIABETES EYE EXAM

## 2021-10-11 ENCOUNTER — Other Ambulatory Visit: Payer: Self-pay | Admitting: Family Medicine

## 2021-10-11 DIAGNOSIS — D0439 Carcinoma in situ of skin of other parts of face: Secondary | ICD-10-CM | POA: Diagnosis not present

## 2021-10-11 DIAGNOSIS — C44329 Squamous cell carcinoma of skin of other parts of face: Secondary | ICD-10-CM | POA: Diagnosis not present

## 2021-10-11 NOTE — Telephone Encounter (Signed)
Note from last CPE says pt takes 50 units of insulin and Rx is for 40 units please advise, CPE was on 09/22/21 ?

## 2021-10-18 DIAGNOSIS — D0439 Carcinoma in situ of skin of other parts of face: Secondary | ICD-10-CM | POA: Diagnosis not present

## 2021-10-25 ENCOUNTER — Other Ambulatory Visit: Payer: Self-pay | Admitting: Family Medicine

## 2021-10-25 DIAGNOSIS — H2511 Age-related nuclear cataract, right eye: Secondary | ICD-10-CM | POA: Diagnosis not present

## 2021-10-26 ENCOUNTER — Ambulatory Visit: Payer: Self-pay | Admitting: Urology

## 2021-11-06 ENCOUNTER — Other Ambulatory Visit: Payer: Self-pay | Admitting: Cardiology

## 2021-11-14 ENCOUNTER — Encounter: Payer: Self-pay | Admitting: Family Medicine

## 2021-11-17 ENCOUNTER — Telehealth: Payer: Self-pay | Admitting: Cardiology

## 2021-11-17 NOTE — Telephone Encounter (Signed)
*  STAT* If patient is at the pharmacy, call can be transferred to refill team.   1. Which medications need to be refilled? (please list name of each medication and dose if known) apixaban (ELIQUIS) 5 MG TABS tablet  2. Which pharmacy/location (including street and city if local pharmacy) is medication to be sent to? [facility] [place], [place]  3. Do they need a 30 day or 90 day supply? [place]

## 2021-11-20 ENCOUNTER — Encounter: Payer: Self-pay | Admitting: Ophthalmology

## 2021-11-21 ENCOUNTER — Ambulatory Visit (INDEPENDENT_AMBULATORY_CARE_PROVIDER_SITE_OTHER)
Admission: RE | Admit: 2021-11-21 | Discharge: 2021-11-21 | Disposition: A | Discharge status: Home / Self Care | Payer: Medicare HMO | Source: Ambulatory Visit | Attending: Family Medicine | Admitting: Family Medicine

## 2021-11-21 ENCOUNTER — Ambulatory Visit (INDEPENDENT_AMBULATORY_CARE_PROVIDER_SITE_OTHER): Payer: Medicare HMO | Admitting: Family Medicine

## 2021-11-21 ENCOUNTER — Other Ambulatory Visit: Payer: Self-pay

## 2021-11-21 ENCOUNTER — Ambulatory Visit
Admission: RE | Admit: 2021-11-21 | Discharge: 2021-11-21 | Disposition: A | Discharge status: Home / Self Care | Payer: Medicare HMO | Source: Ambulatory Visit | Attending: Family Medicine | Admitting: Family Medicine

## 2021-11-21 ENCOUNTER — Encounter: Payer: Self-pay | Admitting: Family Medicine

## 2021-11-21 VITALS — BP 152/80 | HR 69 | Temp 97.9°F | Ht 70.5 in | Wt 260.2 lb

## 2021-11-21 DIAGNOSIS — M25511 Pain in right shoulder: Secondary | ICD-10-CM

## 2021-11-21 DIAGNOSIS — G5602 Carpal tunnel syndrome, left upper limb: Secondary | ICD-10-CM

## 2021-11-21 DIAGNOSIS — G5600 Carpal tunnel syndrome, unspecified upper limb: Secondary | ICD-10-CM | POA: Insufficient documentation

## 2021-11-21 DIAGNOSIS — M19011 Primary osteoarthritis, right shoulder: Secondary | ICD-10-CM | POA: Diagnosis not present

## 2021-11-21 IMAGING — DX DG SHOULDER 2+V*R*
3 series · 3 of 3 positions shown · non-contrast
Comparison: None Available.

CLINICAL DATA: Atraumatic right arm radiculopathy for 3-4 weeks.

EXAM:
RIGHT SHOULDER - 2+ VIEW

[shoulder (grashey view) ap]
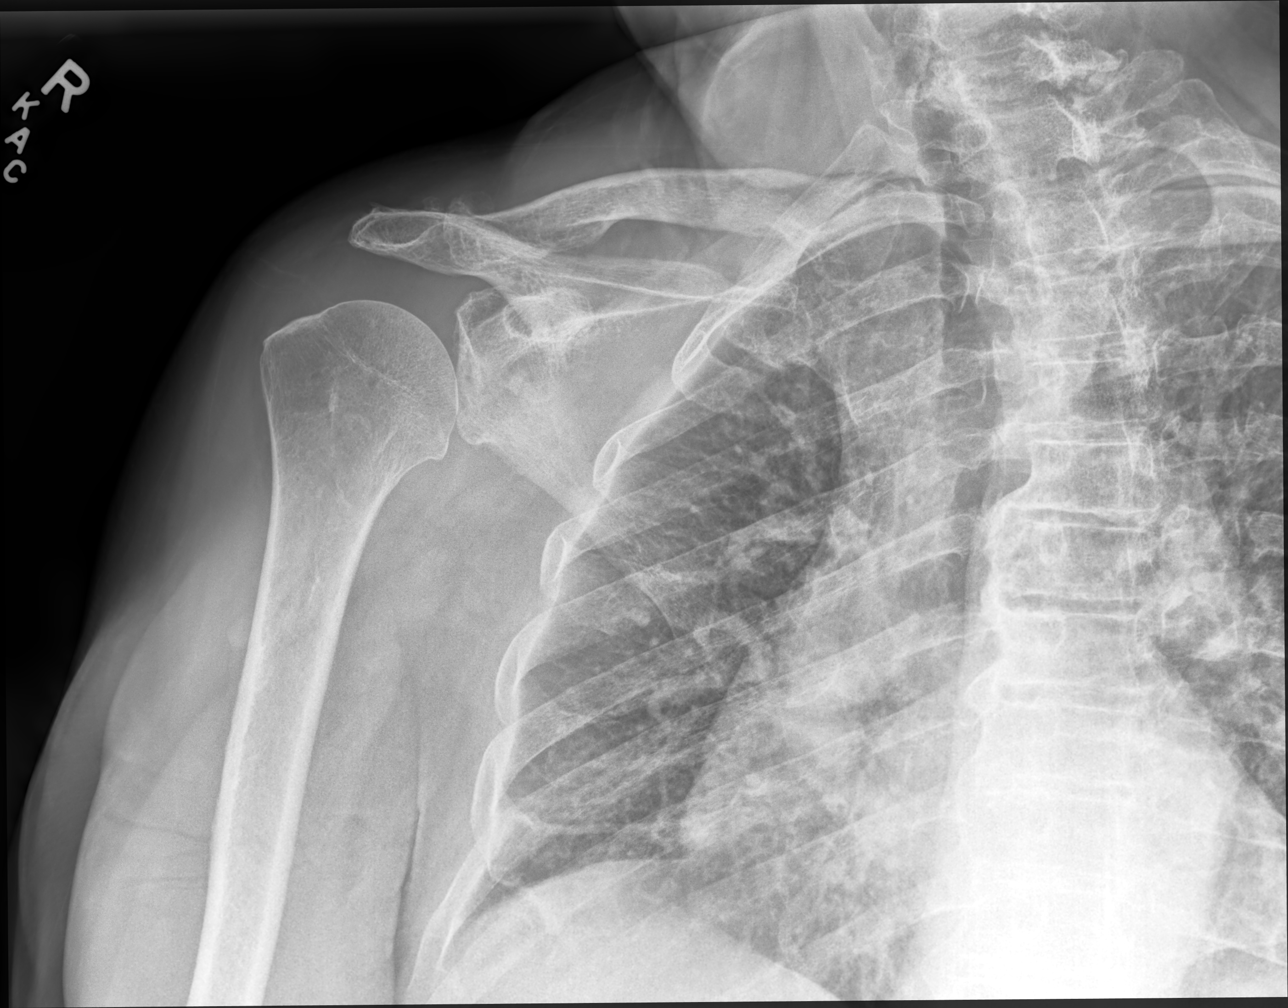

[shoulder (y view) lat]
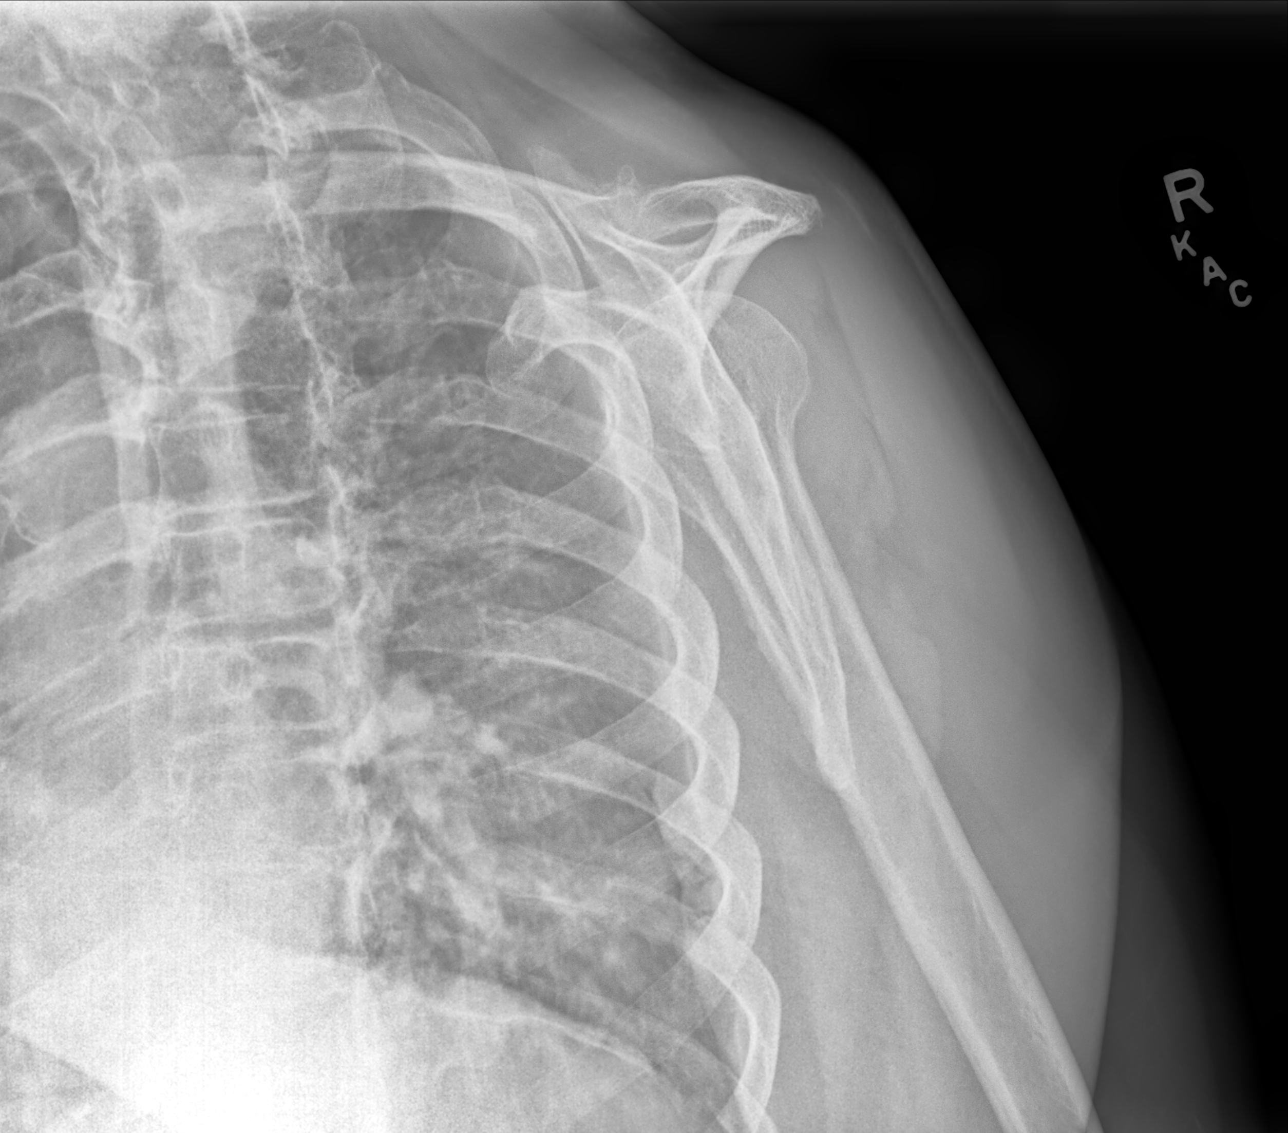

[shoulder axial]
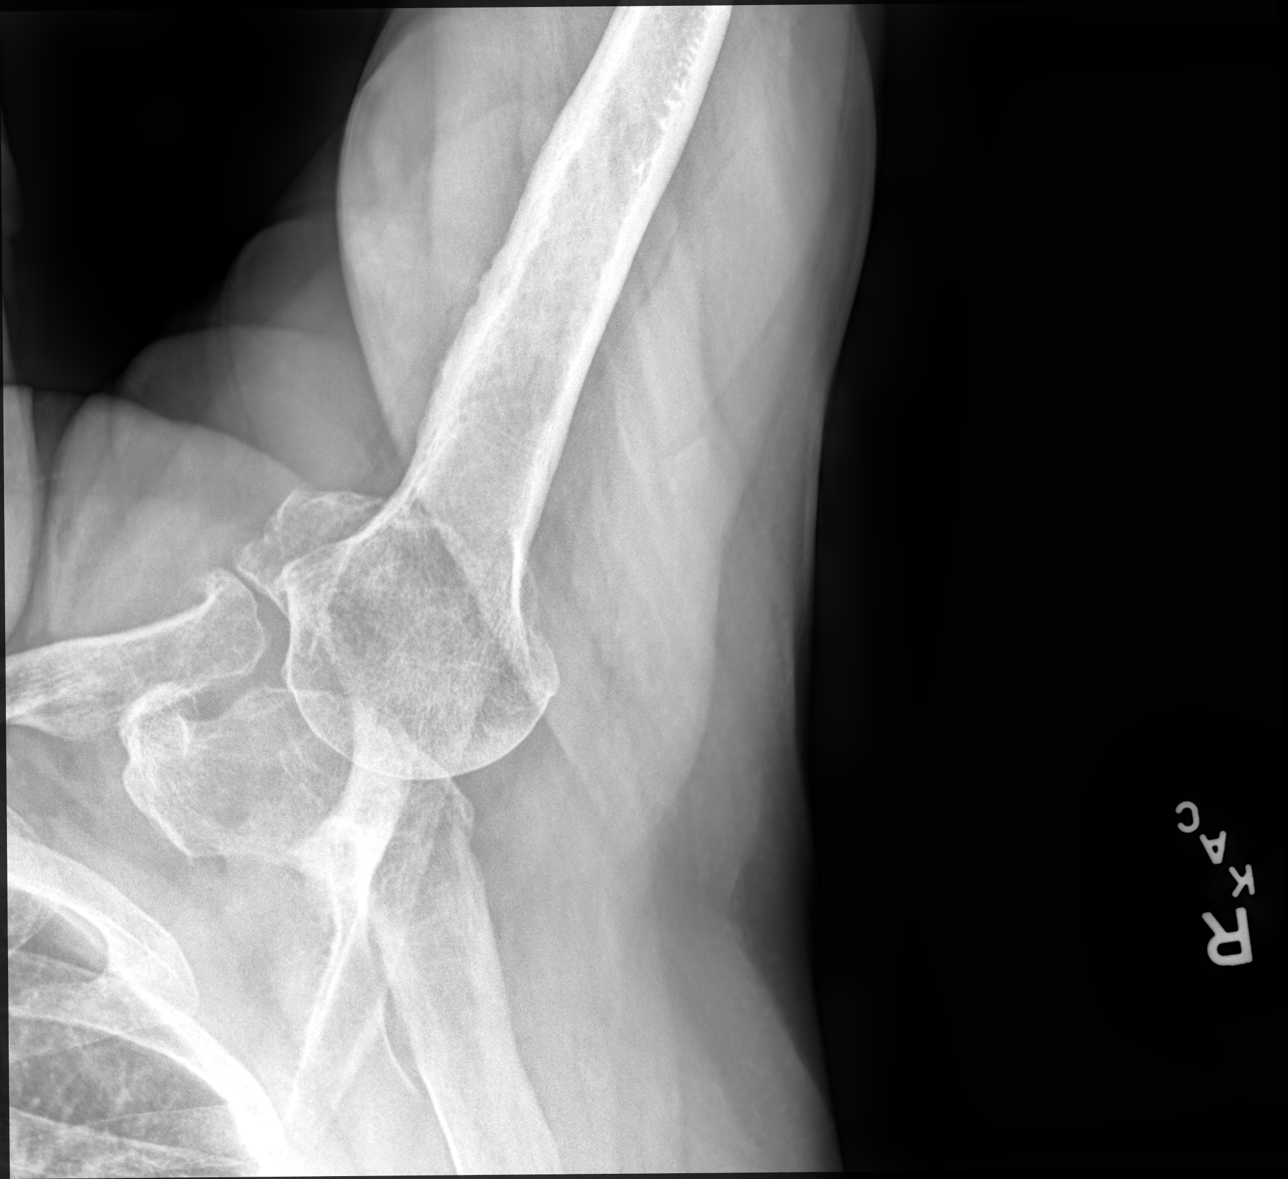

[3 of 3 positions shown; findings below may reference images not displayed]

FINDINGS: There is no evidence of fracture or dislocation. Moderate severity
degenerative changes are seen involving the right acromioclavicular
joint and right glenohumeral articulation. Soft tissues are
unremarkable.
IMPRESSION: Degenerative changes involving the right acromioclavicular joint and
right glenohumeral articulation.

## 2021-11-21 MED ORDER — APIXABAN 5 MG PO TABS: 5.0000 mg | ORAL_TABLET | Freq: Two times a day (BID) | ORAL | 3 refills | Status: DC

## 2021-11-21 MED ORDER — APIXABAN 5 MG PO TABS: 5.0000 mg | ORAL_TABLET | Freq: Two times a day (BID) | ORAL | 5 refills | Status: DC

## 2021-11-21 NOTE — Patient Instructions (Signed)
Let's check a shoulder film   Try voltaren gel 1% up to four times daily on shoulder  Also left wrist -may help   Ice is helpful also for 10 minutes whenever you get a chance   Keep moving shoulder so it does not get stiff but don't work a lot overhead   Look at the range of motion exercise   For wrist - get a carpal tunnel wrist splint and wear it at night  Change watch to the right side  Update if not starting to improve in a week or if worsening

## 2021-11-21 NOTE — Progress Notes (Signed)
Subjective:    Patient ID: Ryan Garcia, male    DOB: August 28, 1933, 88 y.o.   MRN: [id number]  HPI Pt presents with shoulder pain on the R L arm numbness  Wt Readings from Last 3 Encounters:  11/21/21 260 lb 4 oz (118 kg)  09/22/21 251 lb 8 oz (114.1 kg)  08/23/21 257 lb (116.6 kg)   36.81 kg/m  R shoulder pain  Sometimes it feels deep in joint , at times it goes into his upper or lower arm  Varies from 1-5 on pain scale  Dull ache most of the time   No triggers that he knows of  Cannot figure out what causes it  No trauma that he knows of   At times it hurts to lift his arm   No bruising or rash or swelling  Neck does not hurt   Throbs at night /he has to get into the right position  Is on and off   Has taken tylenol for this  It does help  Horse linament helps a bit   L hand tingles  Thumb and index, middle finger  Acts up when eating breakfast    H/o partial rotator cuff tear in L shoulder years ago with adhesive capsulitis   Patient Active Problem List   Diagnosis Date Noted   Right shoulder pain 11/21/2021   Carpal tunnel syndrome 11/21/2021   Atrial flutter with rapid ventricular response ([place])    Rapid atrial fibrillation ([place]) 10/11/2020   Poor balance 07/26/2020   Anemia 04/20/2020   Renal mass, right 04/18/2020   AKI (acute kidney injury) ([place]) 04/14/2020   Cholangitis 04/14/2020   Unstable angina ([place])    Chest pain 04/11/2020   Coronary artery disease involving native coronary artery of native heart without angina pectoris 01/18/2019   Medicare annual wellness visit, subsequent 01/16/2019   Obesity (BMI 30-39.9) 07/09/2018   HOH (hard of hearing) 03/31/2018   Uncontrolled type 2 diabetes mellitus with hyperglycemia ([place]) 10/28/2017   Subclinical hypothyroidism 10/28/2017   Scoliosis    Obstructive sleep apnea on CPAP    Hx of skin cancer, basal cell    Hyperlipidemia associated with type 2 diabetes mellitus (HCC)    GERD  (gastroesophageal reflux disease)    Chronic upper back pain    Arthritis    Allergy    NSTEMI (non-ST elevated myocardial infarction) ([place]) 06/18/2017   Actinic keratoses 04/29/2017   Positive colorectal cancer screening using Cologuard test 10/31/2016   BPH (benign prostatic hyperplasia) 09/04/2016   B12 deficiency 11/22/2015   Routine general medical examination at a health care facility 08/26/2015   Abnormal nuclear cardiac imaging test    Dyspnea on exertion 11/27/2013   Encounter for examination of normal volunteer in research study 05/29/2013   Prostate cancer screening 07/15/2012   CAD S/P percutaneous coronary angioplasty 05/17/2011   Nonspecific abnormal results of cardiovascular function study 04/30/2011   Abnormal EKG 04/06/2011   Right low back pain 01/13/2010   Insulin dependent diabetes mellitus 10/04/2006   ERECTILE DYSFUNCTION 10/04/2006   Primary hypertension 10/04/2006   Allergic rhinitis 10/04/2006   OSTEOARTHRITIS 10/04/2006   Sleep apnea 10/04/2006   EDEMA 10/04/2006   SKIN CANCER, HX OF 10/04/2006   Past Medical History:  Diagnosis Date   AC (acromioclavicular) joint bone spurs    lt ankle   Allergy    allergic rhinitis   Anginal pain (HCC)    Arthritis    OA   BPH (benign  prostatic hyperplasia)    microwave tx of prostate   CAD (coronary artery disease)    a. s/p cath in 2012 showing 70-75% LAD stenosis, 75% D1 stenosis, 75% OM1, 60-70% RCA stenosis, and 80% PDA --> medical therapy pursued b. similar findings by cath in 2016; 06/2017 DES to distal RCA   Chronic kidney disease    Chronic upper back pain    Diabetes mellitus    type II x8 years   GERD (gastroesophageal reflux disease)    HLD (hyperlipidemia)    10/12: TC 208, TG 213, HDL 36, LDL 129   Hx of skin cancer, basal cell    many skin cancers removed/under constant treatment.   Hypertension    NSTEMI (non-ST elevated myocardial infarction) ([place]) 06/18/2017   DES to distal RCA    Obstructive sleep apnea on CPAP    Renal mass    Scoliosis    Past Surgical History:  Procedure Laterality Date   BREAST SURGERY  2009   breast lump removed benign   BUBBLE STUDY  10/14/2020   Procedure: BUBBLE STUDY;  Surgeon: [name], MD;  Location: [place];  Service: Cardiovascular;;   CARDIAC CATHETERIZATION N/A 03/15/2015   Procedure: Left Heart Cath and Coronary Angiography;  Surgeon: Belva Crome, MD;  Location: [facility];  Service: Cardiovascular;  Laterality: N/A;   CARDIOVERSION N/A 10/14/2020   Procedure: CARDIOVERSION;  Surgeon: [name], MD;  Location: [facility] ENDOSCOPY;  Service: Cardiovascular;  Laterality: N/A;   CORONARY STENT INTERVENTION N/A 06/18/2017   Procedure: CORONARY STENT INTERVENTION;  Surgeon: Jettie Booze, MD;  Location: [facility];  Service: Cardiovascular;  Laterality: N/A;  RCA   LEFT HEART CATH AND CORONARY ANGIOGRAPHY N/A 06/18/2017   Procedure: LEFT HEART CATH AND CORONARY ANGIOGRAPHY;  Surgeon: Jettie Booze, MD;  Location: [place] [facility];  Service: Cardiovascular;  Laterality: N/A;   LEFT HEART CATH AND CORONARY ANGIOGRAPHY N/A 04/12/2020   Procedure: LEFT HEART CATH AND CORONARY ANGIOGRAPHY;  Surgeon: Martinique, Peter M, MD;  Location: [place] [facility];  Service: Cardiovascular;  Laterality: N/A;   TEE WITHOUT CARDIOVERSION N/A 10/14/2020   Procedure: TRANSESOPHAGEAL ECHOCARDIOGRAM (TEE);  Surgeon: [name], MD;  Location: [place];  Service: Cardiovascular;  Laterality: N/A;   ULTRASOUND GUIDANCE FOR VASCULAR ACCESS  06/18/2017   Procedure: Ultrasound Guidance For Vascular Access;  Surgeon: Jettie Booze, MD;  Location: [facility];  Service: Cardiovascular;;  right radial, right femoral   Social History   Tobacco Use   Smoking status: Never    Passive exposure: Never   Smokeless tobacco: Never  Vaping Use   Vaping Use: Never used  Substance Use Topics   Alcohol  use: No    Alcohol/week: 0.0 standard drinks   Drug use: No   Family History  Problem Relation Age of Onset   Heart attack Brother    Colon cancer Neg Hx    Colon polyps Neg Hx    Stomach cancer Neg Hx    Esophageal cancer Neg Hx    Allergies  Allergen Reactions   Loratadine Other (See Comments)    Not effective   Simvastatin Other (See Comments)    Intolerant- caused joint pain   Current Outpatient Medications on File Prior to Visit  Medication Sig Dispense Refill   acetaminophen (TYLENOL) 325 MG tablet Take 325-650 mg by mouth every 6 (six) hours as needed for mild pain or headache.  Alcohol Swabs (B-D SINGLE USE SWABS REGULAR) PADS Use to check blood sugar 2 times a day 200 each 3   aspirin 81 MG tablet Take 81 mg by mouth daily.     Blood Glucose Calibration (TRUE METRIX LEVEL 1) Low SOLN Use to check control on glucose meter 1 each 3   Blood Glucose Monitoring Suppl (TRUE METRIX AIR GLUCOSE METER) w/Device KIT 1 kit by Other route 2 (two) times daily. Check blood sugar twice daily and as directed. Dx E11.65 1 kit 0   cetirizine (ZYRTEC) 10 MG tablet TAKE 1 TABLET EVERY DAY 90 tablet 1   Coenzyme Q10 (CO Q 10 PO) Take 200 mg by mouth daily.     Cyanocobalamin (VITAMIN B12) 1000 MCG TBCR Take 1,000 mcg by mouth daily. 30 tablet    fluticasone (FLONASE) 50 MCG/ACT nasal spray Place 1 spray into both nostrils daily as needed for allergies.      glipiZIDE (GLUCOTROL) 10 MG tablet TAKE 1 TABLET TWICE DAILY WITH MEALS 180 tablet 1   glucose blood (TRUE METRIX BLOOD GLUCOSE TEST) test strip CHECK BLOOD SUGAR TWICE DAILY 200 strip 3   insulin glargine (LANTUS SOLOSTAR) 100 UNIT/ML Solostar Pen Inject 50 Units into the skin at bedtime. 45 mL 3   Insulin Pen Needle (DROPLET PEN NEEDLES) 31G X 6 MM MISC USE ONE TIME DAILY  AT  BEDTIME  WITH  LANTUS 100 each 0   isosorbide mononitrate (IMDUR) 60 MG 24 hr tablet TAKE 1 TABLET EVERY DAY 90 tablet 3   loratadine (CLARITIN) 10 MG tablet  Take 10 mg by mouth daily.     metFORMIN (GLUCOPHAGE) 1000 MG tablet TAKE 1 TABLET TWICE DAILY WITH MEALS 180 tablet 2   metoprolol tartrate (LOPRESSOR) 25 MG tablet TAKE 1/2 TABLET TWICE DAILY 90 tablet 0   nitroGLYCERIN (NITROSTAT) 0.4 MG SL tablet Place 1 tablet (0.4 mg total) under the tongue every 5 (five) minutes as needed for chest pain. 25 tablet 3   NOVOLOG FLEXPEN 100 UNIT/ML FlexPen Inject 16 Units into the skin. Before dinner     rosuvastatin (CRESTOR) 40 MG tablet TAKE 1 TABLET EVERY DAY (APPOINTMENT NEEDED FOR CONTINUATION OF REFILLS) 60 tablet 0   TRUEplus Lancets 30G MISC Check blood sugar twice daily and as directed. Dx E11.65 200 each 3   No current facility-administered medications on file prior to visit.    Review of Systems  Constitutional:  Negative for activity change, appetite change, fatigue, fever and unexpected weight change.  HENT:  Negative for congestion, rhinorrhea, sore throat and trouble swallowing.   Eyes:  Negative for pain, redness, itching and visual disturbance.  Respiratory:  Negative for cough, chest tightness, shortness of breath and wheezing.   Cardiovascular:  Negative for chest pain and palpitations.  Gastrointestinal:  Negative for abdominal pain, blood in stool, constipation, diarrhea and nausea.  Endocrine: Negative for cold intolerance, heat intolerance, polydipsia and polyuria.  Genitourinary:  Negative for difficulty urinating, dysuria, frequency and urgency.  Musculoskeletal:  Positive for arthralgias and back pain. Negative for joint swelling, myalgias, neck pain and neck stiffness.  Skin:  Negative for pallor and rash.  Neurological:  Positive for speech difficulty. Negative for dizziness, tremors, weakness, numbness and headaches.       Tingling in L hand  Hematological:  Negative for adenopathy. Does not bruise/bleed easily.  Psychiatric/Behavioral:  Negative for decreased concentration and dysphoric mood. The patient is not  nervous/anxious.       Objective:  Physical Exam Constitutional:      General: He is not in acute distress.    Appearance: Normal appearance. He is obese. He is not ill-appearing.  Eyes:     Conjunctiva/sclera: Conjunctivae normal.     Pupils: Pupils are equal, round, and reactive to light.  Cardiovascular:     Rate and Rhythm: Normal rate.     Heart sounds: Normal heart sounds.  Pulmonary:     Effort: Pulmonary effort is normal. No respiratory distress.     Breath sounds: Normal breath sounds. No wheezing or rales.  Musculoskeletal:     Cervical back: Neck supple. No rigidity or tenderness.     Comments: Shoulder right: No deformity/swelling/warmth or erythema  No crepitus  No obvious effusion  Abduction -full with some discomfort  Hawking test -anterior pain  Neer test -no pain  Internal rotation full External rotation -partial with anterior pain  Tenderness : mild/acromion Normal grip and hand dexterity   L wrist-pos tinel and phalen test  Lymphadenopathy:     Cervical: No cervical adenopathy.  Skin:    General: Skin is warm and dry.     Coloration: Skin is not jaundiced.     Findings: No bruising, erythema or rash.  Neurological:     Mental Status: He is alert.     Cranial Nerves: No cranial nerve deficit.     Deep Tendon Reflexes: Reflexes normal.  Psychiatric:        Mood and Affect: Mood normal.          Assessment & Plan:   Problem List Items Addressed This Visit       Nervous and Auditory   Carpal tunnel syndrome    Pt c/o tingling of first 3 fingers off/on  Pos tinel/phalen in office  inst to move tight watch to other wrist Try carpal tunnel wrist splint otc at night  voltaren gel prn Update if not starting to improve in a week or if worsening  Handout given  If not imp consider eval/neuro         Other   Right shoulder pain - Primary    New-several weeks with no h/o new or old trama  Positional at times Anterolateral/generally ache    Xray ordered  Exam consistent with rotator cuff strain (but rom fairly good) Handout-rom exercises  voltaren gel recommended 1%  Update if not starting to improve in a week or if worsening         Relevant Orders   DG Shoulder Right

## 2021-11-21 NOTE — Assessment & Plan Note (Addendum)
Pt c/o tingling of first 3 fingers off/on  Pos tinel/phalen in office  inst to move tight watch to other wrist Try carpal tunnel wrist splint otc at night  voltaren gel prn Update if not starting to improve in a week or if worsening  Handout given  If not imp consider eval/neuro

## 2021-11-21 NOTE — Assessment & Plan Note (Signed)
New-several weeks with no h/o new or old trama  Positional at times Anterolateral/generally ache   Xray ordered  Exam consistent with rotator cuff strain (but rom fairly good) Handout-rom exercises  voltaren gel recommended 1%  Update if not starting to improve in a week or if worsening

## 2021-11-21 NOTE — Telephone Encounter (Signed)
Prescription refill request for Eliquis received. Indication:Afib Last office visit:[facility] APPT Scr:1.1 Age: 88 Weight:118 kg  Prescription refilled

## 2021-11-22 ENCOUNTER — Other Ambulatory Visit: Payer: Self-pay | Admitting: Pharmacist Clinician (PhC)/ Clinical Pharmacy Specialist

## 2021-11-22 MED ORDER — APIXABAN 5 MG PO TABS: 5.0000 mg | ORAL_TABLET | Freq: Two times a day (BID) | ORAL | 1 refills | Status: DC

## 2021-11-22 NOTE — Telephone Encounter (Signed)
Has OV with [facility] tomorrow  Age 88, wt 118 kg, Scr 1.16

## 2021-11-23 ENCOUNTER — Ambulatory Visit: Payer: Medicare HMO | Admitting: Cardiology

## 2021-11-23 ENCOUNTER — Encounter: Payer: Self-pay | Admitting: Cardiology

## 2021-11-23 VITALS — BP 145/70 | HR 85 | Ht 71.0 in | Wt 262.8 lb

## 2021-11-23 DIAGNOSIS — R0602 Shortness of breath: Secondary | ICD-10-CM

## 2021-11-23 DIAGNOSIS — E7800 Pure hypercholesterolemia, unspecified: Secondary | ICD-10-CM

## 2021-11-23 DIAGNOSIS — I2510 Atherosclerotic heart disease of native coronary artery without angina pectoris: Secondary | ICD-10-CM

## 2021-11-23 DIAGNOSIS — I4892 Unspecified atrial flutter: Secondary | ICD-10-CM

## 2021-11-23 DIAGNOSIS — I10 Essential (primary) hypertension: Secondary | ICD-10-CM | POA: Diagnosis not present

## 2021-11-23 MED ORDER — FUROSEMIDE 20 MG PO TABS
20.0000 mg | ORAL_TABLET | Freq: Every day | ORAL | 3 refills | Status: DC
Start: 2021-11-23 — End: 2021-11-23

## 2021-11-23 MED ORDER — FUROSEMIDE 20 MG PO TABS: 20.0000 mg | ORAL_TABLET | Freq: Every day | ORAL | 3 refills | Status: DC

## 2021-11-23 NOTE — Progress Notes (Signed)
HPI: FU CAD.  Cardiac catheterization in the past has revealed significant three-vessel coronary artery disease.  I reviewed those films with Dr. Percival Spanish and Dr. Lia Foyer and we felt medical therapy best option (pt also wanted to avoid CABG). Patient admitted December 2018 and ruled in for non-ST elevation myocardial infarction.  Patient underwent repeat catheterization revealing severe three-vessel coronary disease.  Left-sided disease was unchanged compared to previous catheterizations and the culprit was felt to be a new RCA lesion and he had PCI with DES. Cardiac catheterization October 2021 showed 85% proximal LAD, 85% 1st diagonal, 75% mid LAD, 65% distal LAD, 80% circumflex, 95% 1st marginal, 75% 2nd marginal, 60% PDA, 70% posterior lateral, patent stent in the right coronary artery.  There were no targets for PCI.  Medical therapy recommended and can consider coronary artery bypass graft if refractory angina. Echocardiogram April 2022 showed normal LV function with basal inferior hypokinesis, severe LVH.  TEE April 2022 showed normal LV function, mild RV dysfunction, no left atrial appendage thrombus, mild right atrial enlargement, small pericardial effusion, mild mitral regurgitation, mild to moderate tricuspid regurgitation. Since last seen, he notes some dyspnea on exertion.  He has increased bilateral lower extremity edema.  He denies chest pain, palpitations or syncope.  Current Outpatient Medications  Medication Sig Dispense Refill   acetaminophen (TYLENOL) 325 MG tablet Take 325-650 mg by mouth every 6 (six) hours as needed for mild pain or headache.     Alcohol Swabs (B-D SINGLE USE SWABS REGULAR) PADS Use to check blood sugar 2 times a day 200 each 3   apixaban (ELIQUIS) 5 MG TABS tablet Take 1 tablet (5 mg total) by mouth 2 (two) times daily. 180 tablet 1   aspirin 81 MG tablet Take 81 mg by mouth daily.     Blood Glucose Calibration (TRUE METRIX LEVEL 1) Low SOLN Use to check  control on glucose meter 1 each 3   Blood Glucose Monitoring Suppl (TRUE METRIX AIR GLUCOSE METER) w/Device KIT 1 kit by Other route 2 (two) times daily. Check blood sugar twice daily and as directed. Dx E11.65 1 kit 0   cetirizine (ZYRTEC) 10 MG tablet TAKE 1 TABLET EVERY DAY 90 tablet 1   Coenzyme Q10 (CO Q 10 PO) Take 200 mg by mouth daily.     Cyanocobalamin (VITAMIN B12) 1000 MCG TBCR Take 1,000 mcg by mouth daily. 30 tablet    fluticasone (FLONASE) 50 MCG/ACT nasal spray Place 1 spray into both nostrils daily as needed for allergies.      glipiZIDE (GLUCOTROL) 10 MG tablet TAKE 1 TABLET TWICE DAILY WITH MEALS 180 tablet 1   glucose blood (TRUE METRIX BLOOD GLUCOSE TEST) test strip CHECK BLOOD SUGAR TWICE DAILY 200 strip 3   insulin glargine (LANTUS SOLOSTAR) 100 UNIT/ML Solostar Pen Inject 50 Units into the skin at bedtime. 45 mL 3   Insulin Pen Needle (DROPLET PEN NEEDLES) 31G X 6 MM MISC USE ONE TIME DAILY  AT  BEDTIME  WITH  LANTUS 100 each 0   isosorbide mononitrate (IMDUR) 60 MG 24 hr tablet TAKE 1 TABLET EVERY DAY 90 tablet 3   loratadine (CLARITIN) 10 MG tablet Take 10 mg by mouth daily.     metFORMIN (GLUCOPHAGE) 1000 MG tablet TAKE 1 TABLET TWICE DAILY WITH MEALS 180 tablet 2   metoprolol tartrate (LOPRESSOR) 25 MG tablet TAKE 1/2 TABLET TWICE DAILY 90 tablet 0   nitroGLYCERIN (NITROSTAT) 0.4 MG SL tablet Place 1  tablet (0.4 mg total) under the tongue every 5 (five) minutes as needed for chest pain. 25 tablet 3   NOVOLOG FLEXPEN 100 UNIT/ML FlexPen Inject 16 Units into the skin. Before dinner     rosuvastatin (CRESTOR) 40 MG tablet TAKE 1 TABLET EVERY DAY (APPOINTMENT NEEDED FOR CONTINUATION OF REFILLS) 60 tablet 0   TRUEplus Lancets 30G MISC Check blood sugar twice daily and as directed. Dx E11.65 200 each 3   No current facility-administered medications for this visit.     Past Medical History:  Diagnosis Date   AC (acromioclavicular) joint bone spurs    lt ankle   Allergy     allergic rhinitis   Anginal pain (HCC)    Arthritis    OA   BPH (benign prostatic hyperplasia)    microwave tx of prostate   CAD (coronary artery disease)    a. s/p cath in 2012 showing 70-75% LAD stenosis, 75% D1 stenosis, 75% OM1, 60-70% RCA stenosis, and 80% PDA --> medical therapy pursued b. similar findings by cath in 2016; 06/2017 DES to distal RCA   Chronic kidney disease    Chronic upper back pain    Diabetes mellitus    type II x8 years   GERD (gastroesophageal reflux disease)    HLD (hyperlipidemia)    10/12: TC 208, TG 213, HDL 36, LDL 129   Hx of skin cancer, basal cell    many skin cancers removed/under constant treatment.   Hypertension    NSTEMI (non-ST elevated myocardial infarction) ([place]) 06/18/2017   DES to distal RCA   Obstructive sleep apnea on CPAP    Renal mass    Scoliosis     Past Surgical History:  Procedure Laterality Date   BREAST SURGERY  2009   breast lump removed benign   BUBBLE STUDY  10/14/2020   Procedure: BUBBLE STUDY;  Surgeon: [name], MD;  Location: [place] [facility];  Service: Cardiovascular;;   CARDIAC CATHETERIZATION N/A 03/15/2015   Procedure: Left Heart Cath and Coronary Angiography;  Surgeon: Belva Crome, MD;  Location: [facility];  Service: Cardiovascular;  Laterality: N/A;   CARDIOVERSION N/A 10/14/2020   Procedure: CARDIOVERSION;  Surgeon: [name], MD;  Location: [facility] ENDOSCOPY;  Service: Cardiovascular;  Laterality: N/A;   CORONARY STENT INTERVENTION N/A 06/18/2017   Procedure: CORONARY STENT INTERVENTION;  Surgeon: Jettie Booze, MD;  Location: [facility];  Service: Cardiovascular;  Laterality: N/A;  RCA   LEFT HEART CATH AND CORONARY ANGIOGRAPHY N/A 06/18/2017   Procedure: LEFT HEART CATH AND CORONARY ANGIOGRAPHY;  Surgeon: Jettie Booze, MD;  Location: [facility];  Service: Cardiovascular;  Laterality: N/A;   LEFT HEART CATH AND CORONARY ANGIOGRAPHY N/A 04/12/2020    Procedure: LEFT HEART CATH AND CORONARY ANGIOGRAPHY;  Surgeon: Martinique, Peter M, MD;  Location: [facility];  Service: Cardiovascular;  Laterality: N/A;   TEE WITHOUT CARDIOVERSION N/A 10/14/2020   Procedure: TRANSESOPHAGEAL ECHOCARDIOGRAM (TEE);  Surgeon: [name], MD;  Location: [place];  Service: Cardiovascular;  Laterality: N/A;   ULTRASOUND GUIDANCE FOR VASCULAR ACCESS  06/18/2017   Procedure: Ultrasound Guidance For Vascular Access;  Surgeon: Jettie Booze, MD;  Location: [facility];  Service: Cardiovascular;;  right radial, right femoral    Social History   Socioeconomic History   Marital status: Married    Spouse name: Alf Doyle   Number of children: 3   Years of education: Not on file  Highest education level: Not on file  Occupational History   Not on file  Tobacco Use   Smoking status: Never    Passive exposure: Never   Smokeless tobacco: Never  Vaping Use   Vaping Use: Never used  Substance and Sexual Activity   Alcohol use: No    Alcohol/week: 0.0 standard drinks   Drug use: No   Sexual activity: Not Currently  Other Topics Concern   Not on file  Social History Narrative   Married    Physically active hard of hearing   Social Determinants of Health   Financial Resource Strain: Low Risk    Difficulty of Paying Living Expenses: Not hard at all  Food Insecurity: No Food Insecurity   Worried About Charity fundraiser in the Last Year: Never true   Arboriculturist in the Last Year: Never true  Transportation Needs: No Transportation Needs   Lack of Transportation (Medical): No   Lack of Transportation (Non-Medical): No  Physical Activity: Inactive   Days of Exercise per Week: 0 days   Minutes of Exercise per Session: 0 min  Stress: No Stress Concern Present   Feeling of Stress : Not at all  Social Connections: Moderately Integrated   Frequency of Communication with Friends and Family: More than three times a week    Frequency of Social Gatherings with Friends and Family: More than three times a week   Attends Religious Services: More than 4 times per year   Active Member of [employer] or Organizations: No   Attends Music therapist: Never   Marital Status: Married  Human resources officer Violence: Not At Risk   Fear of Current or Ex-Partner: No   Emotionally Abused: No   Physically Abused: No   Sexually Abused: No    Family History  Problem Relation Age of Onset   Heart attack Brother    Colon cancer Neg Hx    Colon polyps Neg Hx    Stomach cancer Neg Hx    Esophageal cancer Neg Hx     ROS: no fevers or chills, productive cough, hemoptysis, dysphasia, odynophagia, melena, hematochezia, dysuria, hematuria, rash, seizure activity, orthopnea, PND, claudication. Remaining systems are negative.  Physical Exam: Well-developed well-nourished in no acute distress.  Skin is warm and dry.  HEENT is normal.  Neck is supple.  Chest is clear to auscultation with normal expansion.  Cardiovascular exam is regular rate and rhythm.  Abdominal exam nontender or distended. No masses palpated. Extremities show trace to 1+ edema. neuro grossly intact  ECG-normal sinus rhythm with first-degree AV block, occasional PAC\.  Personally reviewed  A/P  1 coronary artery disease-patient denies chest pain.  As outlined in HPI he would like to avoid CABG and we will continue medical therapy.  Continue statin, beta-blocker and nitrates.  Discontinue aspirin given need for apixaban.  2 hypertension-blood pressure controlled.  Continue present medical regimen.  3 hyperlipidemia-continue statin.  4 paroxysmal atrial flutter -patient remains in sinus rhythm.  We will continue metoprolol and apixaban.  5 obstructive sleep apnea-continue CPAP.  6 chronic diastolic congestive heart failure-we will add Lasix 20 mg daily.  Check potassium and renal function in 1 week.  Kirk Ruths, MD

## 2021-11-23 NOTE — Discharge Instructions (Signed)

## 2021-11-23 NOTE — Patient Instructions (Signed)
Medication Instructions:   STOP ASPIRIN  START FUROSEMIDE 20 MG ONCE DAILY  *If you need a refill on your cardiac medications before your next appointment, please call your pharmacy*   Lab Work:  Your physician recommends that you return for lab work in: ONE WEEK-DO NOT NEED TO FAST  If you have labs (blood work) drawn today and your tests are completely normal, you will receive your results only by: [place] (if you have MyChart) OR A paper copy in the mail If you have any lab test that is abnormal or we need to change your treatment, we will call you to review the results.   Follow-Up: At [facility], you and your health needs are our priority.  As part of our continuing mission to provide you with exceptional heart care, we have created designated Provider Care Teams.  These Care Teams include your primary Cardiologist (physician) and Advanced Practice Providers (APPs -  Physician Assistants and Nurse Practitioners) who all work together to provide you with the care you need, when you need it.  We recommend signing up for the patient portal called "MyChart".  Sign up information is provided on this After Visit Summary.  MyChart is used to connect with patients for Virtual Visits (Telemedicine).  Patients are able to view lab/test results, encounter notes, upcoming appointments, etc.  Non-urgent messages can be sent to your provider as well.   To learn more about what you can do with MyChart, go to [email or web address].    Your next appointment:   6 month(s)  The format for your next appointment:   In Person  Provider:   Kirk Ruths, MD       Important Information About Sugar

## 2021-11-29 ENCOUNTER — Encounter: Payer: Self-pay | Admitting: Ophthalmology

## 2021-11-29 ENCOUNTER — Ambulatory Visit: Payer: Medicare HMO | Admitting: Anesthesiology

## 2021-11-29 ENCOUNTER — Other Ambulatory Visit: Payer: Self-pay

## 2021-11-29 ENCOUNTER — Encounter
Admission: RE | Disposition: A | Discharge status: Home / Self Care | Payer: Self-pay | Source: Home / Self Care | Attending: Ophthalmology

## 2021-11-29 ENCOUNTER — Ambulatory Visit
Admission: RE | Admit: 2021-11-29 | Discharge: 2021-11-29 | Disposition: A | Discharge status: Home / Self Care | Payer: Medicare HMO | Attending: Ophthalmology | Admitting: Ophthalmology

## 2021-11-29 DIAGNOSIS — G4733 Obstructive sleep apnea (adult) (pediatric): Secondary | ICD-10-CM | POA: Insufficient documentation

## 2021-11-29 DIAGNOSIS — Z8249 Family history of ischemic heart disease and other diseases of the circulatory system: Secondary | ICD-10-CM | POA: Diagnosis not present

## 2021-11-29 DIAGNOSIS — Z794 Long term (current) use of insulin: Secondary | ICD-10-CM | POA: Diagnosis not present

## 2021-11-29 DIAGNOSIS — Z955 Presence of coronary angioplasty implant and graft: Secondary | ICD-10-CM | POA: Diagnosis not present

## 2021-11-29 DIAGNOSIS — E669 Obesity, unspecified: Secondary | ICD-10-CM | POA: Insufficient documentation

## 2021-11-29 DIAGNOSIS — Z7984 Long term (current) use of oral hypoglycemic drugs: Secondary | ICD-10-CM | POA: Diagnosis not present

## 2021-11-29 DIAGNOSIS — Z6836 Body mass index (BMI) 36.0-36.9, adult: Secondary | ICD-10-CM | POA: Diagnosis not present

## 2021-11-29 DIAGNOSIS — H25811 Combined forms of age-related cataract, right eye: Secondary | ICD-10-CM | POA: Diagnosis not present

## 2021-11-29 DIAGNOSIS — E1136 Type 2 diabetes mellitus with diabetic cataract: Secondary | ICD-10-CM | POA: Insufficient documentation

## 2021-11-29 DIAGNOSIS — I129 Hypertensive chronic kidney disease with stage 1 through stage 4 chronic kidney disease, or unspecified chronic kidney disease: Secondary | ICD-10-CM | POA: Diagnosis not present

## 2021-11-29 DIAGNOSIS — I2510 Atherosclerotic heart disease of native coronary artery without angina pectoris: Secondary | ICD-10-CM | POA: Insufficient documentation

## 2021-11-29 DIAGNOSIS — M1990 Unspecified osteoarthritis, unspecified site: Secondary | ICD-10-CM | POA: Insufficient documentation

## 2021-11-29 DIAGNOSIS — N189 Chronic kidney disease, unspecified: Secondary | ICD-10-CM | POA: Insufficient documentation

## 2021-11-29 DIAGNOSIS — I252 Old myocardial infarction: Secondary | ICD-10-CM | POA: Diagnosis not present

## 2021-11-29 DIAGNOSIS — E1122 Type 2 diabetes mellitus with diabetic chronic kidney disease: Secondary | ICD-10-CM | POA: Diagnosis not present

## 2021-11-29 DIAGNOSIS — H2511 Age-related nuclear cataract, right eye: Secondary | ICD-10-CM | POA: Insufficient documentation

## 2021-11-29 DIAGNOSIS — E039 Hypothyroidism, unspecified: Secondary | ICD-10-CM | POA: Insufficient documentation

## 2021-11-29 HISTORY — DX: Other specified disorders of kidney and ureter: N28.89

## 2021-11-29 HISTORY — DX: Unspecified atrial flutter: I48.92

## 2021-11-29 HISTORY — PX: CATARACT EXTRACTION W/PHACO: SHX586

## 2021-11-29 LAB — GLUCOSE, CAPILLARY
Glucose-Capillary: 93 mg/dL (ref 70–99)
Glucose-Capillary: 105 mg/dL — ABNORMAL HIGH (ref 70–99)

## 2021-11-29 SURGERY — PHACOEMULSIFICATION, CATARACT, WITH IOL INSERTION
Anesthesia: Monitor Anesthesia Care | Site: Eye | Laterality: Right

## 2021-11-29 MED ORDER — BRIMONIDINE TARTRATE-TIMOLOL 0.2-0.5 % OP SOLN
OPHTHALMIC | Status: DC | PRN
  Administered 2021-11-29: 1 [drp] via OPHTHALMIC

## 2021-11-29 MED ORDER — SIGHTPATH DOSE#1 NA HYALUR & NA CHOND-NA HYALUR IO KIT
PACK | INTRAOCULAR | Status: DC | PRN
  Administered 2021-11-29: 1 via OPHTHALMIC

## 2021-11-29 MED ORDER — SIGHTPATH DOSE#1 BSS IO SOLN
INTRAOCULAR | Status: DC | PRN
  Administered 2021-11-29: 15 mL

## 2021-11-29 MED ORDER — ACETAMINOPHEN 160 MG/5ML PO SOLN: 325.0000 mg | ORAL | Status: DC | PRN

## 2021-11-29 MED ORDER — SIGHTPATH DOSE#1 BSS IO SOLN
INTRAOCULAR | Status: DC | PRN
  Administered 2021-11-29: 83 mL via OPHTHALMIC

## 2021-11-29 MED ORDER — TETRACAINE HCL 0.5 % OP SOLN
1.0000 [drp] | OPHTHALMIC | Status: DC | PRN
  Administered 2021-11-29 (×3): 1 [drp] via OPHTHALMIC

## 2021-11-29 MED ORDER — SIGHTPATH DOSE#1 BSS IO SOLN
INTRAOCULAR | Status: DC | PRN
  Administered 2021-11-29: 1 mL via INTRAMUSCULAR

## 2021-11-29 MED ORDER — CEFUROXIME OPHTHALMIC INJECTION 1 MG/0.1 ML
INJECTION | OPHTHALMIC | Status: DC | PRN
  Administered 2021-11-29: 1 mg via OPHTHALMIC

## 2021-11-29 MED ORDER — FENTANYL CITRATE (PF) 100 MCG/2ML IJ SOLN
INTRAMUSCULAR | Status: DC | PRN
  Administered 2021-11-29: 50 ug via INTRAVENOUS

## 2021-11-29 MED ORDER — HYDRALAZINE HCL 20 MG/ML IJ SOLN
INTRAMUSCULAR | Status: DC | PRN
  Administered 2021-11-29: 10 mg via INTRAVENOUS

## 2021-11-29 MED ORDER — ACETAMINOPHEN 325 MG PO TABS: 325.0000 mg | ORAL_TABLET | ORAL | Status: DC | PRN

## 2021-11-29 MED ORDER — ARMC OPHTHALMIC DILATING DROPS
1.0000 "application " | OPHTHALMIC | Status: DC | PRN
  Administered 2021-11-29 (×3): 1 via OPHTHALMIC

## 2021-11-29 MED ORDER — ONDANSETRON HCL 4 MG/2ML IJ SOLN: 4.0000 mg | Freq: Once | INTRAMUSCULAR | Status: DC | PRN

## 2021-11-29 SURGICAL SUPPLY — 11 items
CATARACT SUITE SIGHTPATH (MISCELLANEOUS) ×2 IMPLANT
FEE CATARACT SUITE SIGHTPATH (MISCELLANEOUS) ×1 IMPLANT
GLOVE SRG 8 PF TXTR STRL LF DI (GLOVE) ×1 IMPLANT
GLOVE SURG ENC TEXT LTX SZ7.5 (GLOVE) ×2 IMPLANT
GLOVE SURG UNDER POLY LF SZ8 (GLOVE) ×2
LENS IOL TECNIS EYHANCE 18.5 (Intraocular Lens) ×1 IMPLANT
NDL FILTER BLUNT 18X1 1/2 (NEEDLE) ×1 IMPLANT
NEEDLE FILTER BLUNT 18X 1/2SAF (NEEDLE) ×1
NEEDLE FILTER BLUNT 18X1 1/2 (NEEDLE) ×1 IMPLANT
SYR 3ML LL SCALE MARK (SYRINGE) ×2 IMPLANT
WATER STERILE IRR 250ML POUR (IV SOLUTION) ×2 IMPLANT

## 2021-11-29 NOTE — Anesthesia Preprocedure Evaluation (Addendum)
Anesthesia Evaluation  Patient identified by MRN, date of birth, ID band Patient awake    Reviewed: Allergy & Precautions, NPO status   Airway Mallampati: II  TM Distance: >3 FB     Dental   Pulmonary sleep apnea and Continuous Positive Airway Pressure Ventilation ,    Pulmonary exam normal        Cardiovascular hypertension, + CAD, + Past MI and + Cardiac Stents (DES to RCA)  + dysrhythmias Atrial Fibrillation  Rhythm:Regular Rate:Normal  Severe CAD - Cardiac catheterization October 2021 showed 85% proximal LAD, 85% 1st diagonal, 75% mid LAD, 65% distal LAD, 80% circumflex, 95% 1st marginal, 75% 2nd marginal, 60% PDA, 70% posterior lateral, patent stent in the right coronary artery.  There were no targets for PCI.    Patient prefers to avoid CABG. Last visit to cardiologist 11/23/21. Denies chest pain.    Neuro/Psych    GI/Hepatic GERD  ,  Endo/Other  diabetesHypothyroidism Obesity - BMI 36  Renal/GU      Musculoskeletal  (+) Arthritis ,   Abdominal   Peds  Hematology  (+) Blood dyscrasia, anemia ,   Anesthesia Other Findings   Reproductive/Obstetrics                           Anesthesia Physical Anesthesia Plan  ASA: 4  Anesthesia Plan: MAC   Post-op Pain Management: Minimal or no pain anticipated   Induction: Intravenous  PONV Risk Score and Plan: TIVA, Midazolam and Treatment may vary due to age or medical condition  Airway Management Planned: Natural Airway and Nasal Cannula  Additional Equipment:   Intra-op Plan:   Post-operative Plan:   Informed Consent: I have reviewed the patients History and Physical, chart, labs and discussed the procedure including the risks, benefits and alternatives for the proposed anesthesia with the patient or authorized representative who has indicated his/her understanding and acceptance.       Plan Discussed with: CRNA  Anesthesia Plan  Comments:        Anesthesia Quick Evaluation

## 2021-11-29 NOTE — H&P (Signed)
[facility]   Primary Care Physician:  Tower, Wynelle Fanny, MD Ophthalmologist: Dr. Leandrew Koyanagi  Pre-Procedure History & Physical: HPI:  Ryan Garcia is a 88 y.o. male here for ophthalmic surgery.   Past Medical History:  Diagnosis Date   AC (acromioclavicular) joint bone spurs    lt ankle   Allergy    allergic rhinitis   Anginal pain (HCC)    Arthritis    OA   BPH (benign prostatic hyperplasia)    microwave tx of prostate   CAD (coronary artery disease)    a. s/p cath in 2012 showing 70-75% LAD stenosis, 75% D1 stenosis, 75% OM1, 60-70% RCA stenosis, and 80% PDA --> medical therapy pursued b. similar findings by cath in 2016; 06/2017 DES to distal RCA   Chronic kidney disease    Chronic upper back pain    Diabetes mellitus    type II x8 years   GERD (gastroesophageal reflux disease)    HLD (hyperlipidemia)    10/12: TC 208, TG 213, HDL 36, LDL 129   Hx of skin cancer, basal cell    many skin cancers removed/under constant treatment.   Hypertension    NSTEMI (non-ST elevated myocardial infarction) ([place]) 06/18/2017   DES to distal RCA   Obstructive sleep apnea on CPAP    Paroxysmal atrial flutter ([place])    Renal mass    Scoliosis     Past Surgical History:  Procedure Laterality Date   BREAST SURGERY  2009   breast lump removed benign   BUBBLE STUDY  10/14/2020   Procedure: BUBBLE STUDY;  Surgeon: [name], MD;  Location: [place];  Service: Cardiovascular;;   CARDIAC CATHETERIZATION N/A 03/15/2015   Procedure: Left Heart Cath and Coronary Angiography;  Surgeon: Belva Crome, MD;  Location: [place] CV LAB;  Service: Cardiovascular;  Laterality: N/A;   CARDIOVERSION N/A 10/14/2020   Procedure: CARDIOVERSION;  Surgeon: [name], MD;  Location: [facility] ENDOSCOPY;  Service: Cardiovascular;  Laterality: N/A;   CORONARY STENT INTERVENTION N/A 06/18/2017   Procedure: CORONARY STENT INTERVENTION;  Surgeon: Jettie Booze, MD;  Location: [place] CV LAB;  Service: Cardiovascular;  Laterality: N/A;  RCA   LEFT HEART CATH AND CORONARY ANGIOGRAPHY N/A 06/18/2017   Procedure: LEFT HEART CATH AND CORONARY ANGIOGRAPHY;  Surgeon: Jettie Booze, MD;  Location: [place] CV LAB;  Service: Cardiovascular;  Laterality: N/A;   LEFT HEART CATH AND CORONARY ANGIOGRAPHY N/A 04/12/2020   Procedure: LEFT HEART CATH AND CORONARY ANGIOGRAPHY;  Surgeon: Martinique, Peter M, MD;  Location: [place] [facility] LAB;  Service: Cardiovascular;  Laterality: N/A;   TEE WITHOUT CARDIOVERSION N/A 10/14/2020   Procedure: TRANSESOPHAGEAL ECHOCARDIOGRAM (TEE);  Surgeon: [name], MD;  Location: [place];  Service: Cardiovascular;  Laterality: N/A;   ULTRASOUND GUIDANCE FOR VASCULAR ACCESS  06/18/2017   Procedure: Ultrasound Guidance For Vascular Access;  Surgeon: Jettie Booze, MD;  Location: [place] [facility] LAB;  Service: Cardiovascular;;  right radial, right femoral    Prior to Admission medications   Medication Sig Start Date End Date Taking? Authorizing Provider  acetaminophen (TYLENOL) 325 MG tablet Take 325-650 mg by mouth every 6 (six) hours as needed for mild pain or headache.   Yes [provider]  apixaban (ELIQUIS) 5 MG TABS tablet Take 1 tablet (5 mg total) by mouth 2 (two) times daily. 11/22/21  Yes Lelon Perla, MD  cetirizine (ZYRTEC) 10 MG tablet TAKE 1  TABLET EVERY DAY 11/14/16  Yes Tower, Wynelle Fanny, MD  Coenzyme Q10 (CO Q 10 PO) Take 200 mg by mouth daily.   Yes [provider]  Cyanocobalamin (VITAMIN B12) 1000 MCG TBCR Take 1,000 mcg by mouth daily. 04/12/20  Yes Cheryln Manly, NP  fluticasone (FLONASE) 50 MCG/ACT nasal spray Place 1 spray into both nostrils daily as needed for allergies.    Yes [provider]  furosemide (LASIX) 20 MG tablet Take 1 tablet (20 mg total) by mouth daily. 11/23/21  Yes Lelon Perla, MD  glipiZIDE (GLUCOTROL) 10 MG tablet TAKE 1 TABLET TWICE DAILY WITH  MEALS 12/22/20  Yes Tower, Marne A, MD  glucose blood (TRUE METRIX BLOOD GLUCOSE TEST) test strip CHECK BLOOD SUGAR TWICE DAILY 03/30/21  Yes Tower, Marne A, MD  insulin glargine (LANTUS SOLOSTAR) 100 UNIT/ML Solostar Pen Inject 50 Units into the skin at bedtime. 10/11/21  Yes Tower, Wynelle Fanny, MD  Insulin Pen Needle (DROPLET PEN NEEDLES) 31G X 6 MM MISC USE ONE TIME DAILY  AT  BEDTIME  WITH  LANTUS 06/28/21  Yes Tower, Wynelle Fanny, MD  isosorbide mononitrate (IMDUR) 60 MG 24 hr tablet TAKE 1 TABLET EVERY DAY 02/02/21  Yes Lelon Perla, MD  loratadine (CLARITIN) 10 MG tablet Take 10 mg by mouth daily.   Yes [provider]  metFORMIN (GLUCOPHAGE) 1000 MG tablet TAKE 1 TABLET TWICE DAILY WITH MEALS 10/25/21  Yes Tower, Marne A, MD  metoprolol tartrate (LOPRESSOR) 25 MG tablet TAKE 1/2 TABLET TWICE DAILY 06/05/21  Yes Crenshaw, Denice Bors, MD  NOVOLOG FLEXPEN 100 UNIT/ML FlexPen Inject 16 Units into the skin. Before dinner 09/14/21  Yes [provider]  rosuvastatin (CRESTOR) 40 MG tablet TAKE 1 TABLET EVERY DAY (APPOINTMENT NEEDED FOR CONTINUATION OF REFILLS) 11/06/21  Yes Crenshaw, Denice Bors, MD  TRUEplus Lancets 30G MISC Check blood sugar twice daily and as directed. Dx E11.65 03/30/21  Yes Tower, Wynelle Fanny, MD  Alcohol Swabs (B-D SINGLE USE SWABS REGULAR) PADS Use to check blood sugar 2 times a day 03/30/21   Tower, Wynelle Fanny, MD  Blood Glucose Calibration (TRUE METRIX LEVEL 1) Low SOLN Use to check control on glucose meter 03/30/21   Tower, Wynelle Fanny, MD  Blood Glucose Monitoring Suppl (TRUE METRIX AIR GLUCOSE METER) w/Device KIT 1 kit by Other route 2 (two) times daily. Check blood sugar twice daily and as directed. Dx E11.65 03/30/21   Tower, Wynelle Fanny, MD  nitroGLYCERIN (NITROSTAT) 0.4 MG SL tablet Place 1 tablet (0.4 mg total) under the tongue every 5 (five) minutes as needed for chest pain. 05/11/19   Ledora Bottcher, PA    Allergies as of 10/10/2021 - Review Complete 09/22/2021  Allergen Reaction  Noted   Loratadine Other (See Comments) 11/08/2009   Simvastatin Other (See Comments) 09/16/2008    Family History  Problem Relation Age of Onset   Heart attack Brother    Colon cancer Neg Hx    Colon polyps Neg Hx    Stomach cancer Neg Hx    Esophageal cancer Neg Hx     Social History   Socioeconomic History   Marital status: Married    Spouse name: Olivier Frayre   Number of children: 3   Years of education: Not on file   Highest education level: Not on file  Occupational History   Not on file  Tobacco Use   Smoking status: Never    Passive exposure: Never   Smokeless  tobacco: Never  Vaping Use   Vaping Use: Never used  Substance and Sexual Activity   Alcohol use: No    Alcohol/week: 0.0 standard drinks   Drug use: No   Sexual activity: Not Currently  Other Topics Concern   Not on file  Social History Narrative   Married    Physically active hard of hearing   Social Determinants of Health   Financial Resource Strain: Low Risk    Difficulty of Paying Living Expenses: Not hard at all  Food Insecurity: No Food Insecurity   Worried About Charity fundraiser in the Last Year: Never true   Arboriculturist in the Last Year: Never true  Transportation Needs: No Transportation Needs   Lack of Transportation (Medical): No   Lack of Transportation (Non-Medical): No  Physical Activity: Inactive   Days of Exercise per Week: 0 days   Minutes of Exercise per Session: 0 min  Stress: No Stress Concern Present   Feeling of Stress : Not at all  Social Connections: Moderately Integrated   Frequency of Communication with Friends and Family: More than three times a week   Frequency of Social Gatherings with Friends and Family: More than three times a week   Attends Religious Services: More than 4 times per year   Active Member of [employer] or Organizations: No   Attends Archivist Meetings: Never   Marital Status: Married  Human resources officer Violence: Not At Risk   Fear  of Current or Ex-Partner: No   Emotionally Abused: No   Physically Abused: No   Sexually Abused: No    Review of Systems: See HPI, otherwise negative ROS  Physical Exam: BP (!) 179/85   Pulse 68   Temp 97.9 F (36.6 C) (Temporal)   Ht 5' 11"  (1.803 m)   Wt 118.2 kg   SpO2 97%   BMI 36.35 kg/m  General:   Alert,  pleasant and cooperative in NAD Head:  Normocephalic and atraumatic. Lungs:  Clear to auscultation.    Heart:  Regular rate and rhythm.   Impression/Plan: Ryan Garcia is here for ophthalmic surgery.  Risks, benefits, limitations, and alternatives regarding ophthalmic surgery have been reviewed with the patient.  Questions have been answered.  All parties agreeable.   Leandrew Koyanagi, MD  11/29/2021, 7:32 AM

## 2021-11-29 NOTE — Op Note (Signed)
LOCATION:  [place]   PREOPERATIVE DIAGNOSIS:    Nuclear sclerotic cataract right eye. H25.11   POSTOPERATIVE DIAGNOSIS:  Nuclear sclerotic cataract right eye.     PROCEDURE:  Phacoemusification with posterior chamber intraocular lens placement of the right eye   ULTRASOUND TIME: Procedure(s) with comments: CATARACT EXTRACTION PHACO AND INTRAOCULAR LENS PLACEMENT (IOC) RIGHT DIABETIC (Right) - Diabetic 18.72 01:54.3  LENS:   Implant Name Type Inv. Item Serial No. Manufacturer Lot No. LRB No. Used Action  LENS IOL TECNIS EYHANCE 18.5 - [id number] Intraocular Lens LENS IOL TECNIS EYHANCE 18.5 [id number] [employer]  Right 1 Implanted         SURGEON:  Wyonia Hough, MD   ANESTHESIA:  Topical with tetracaine drops and 2% Xylocaine jelly, augmented with 1% preservative-free intracameral lidocaine.    COMPLICATIONS:  None.   DESCRIPTION OF PROCEDURE:  The patient was identified in the holding room and transported to the operating room and placed in the supine position under the operating microscope.  The right eye was identified as the operative eye and it was prepped and draped in the usual sterile ophthalmic fashion.   A 1 millimeter clear-corneal paracentesis was made at the 12:00 position.  0.5 ml of preservative-free 1% lidocaine was injected into the anterior chamber. The anterior chamber was filled with Viscoat viscoelastic.  A 2.4 millimeter keratome was used to make a near-clear corneal incision at the 9:00 position.  A curvilinear capsulorrhexis was made with a cystotome and capsulorrhexis forceps.  Balanced salt solution was used to hydrodissect and hydrodelineate the nucleus.   Phacoemulsification was then used in stop and chop fashion to remove the lens nucleus and epinucleus.  The remaining cortex was then removed using the irrigation and aspiration handpiece. Provisc was then placed into the capsular bag to distend it for lens placement.  A lens was then  injected into the capsular bag.  The remaining viscoelastic was aspirated.   Wounds were hydrated with balanced salt solution.  The anterior chamber was inflated to a physiologic pressure with balanced salt solution.  No wound leaks were noted. Cefuroxime 0.1 ml of a '10mg'$ /ml solution was injected into the anterior chamber for a dose of 1 mg of intracameral antibiotic at the completion of the case.   Timolol and Brimonidine drops were applied to the eye.  The patient was taken to the recovery room in stable condition without complications of anesthesia or surgery.   Albie Bazin 11/29/2021, 8:02 AM

## 2021-11-29 NOTE — Anesthesia Postprocedure Evaluation (Signed)
Anesthesia Post Note  Patient: Ryan Garcia  Procedure(s) Performed: CATARACT EXTRACTION PHACO AND INTRAOCULAR LENS PLACEMENT (IOC) RIGHT DIABETIC (Right: Eye)     Patient location during evaluation: PACU Anesthesia Type: MAC Level of consciousness: awake Pain management: pain level controlled Vital Signs Assessment: post-procedure vital signs reviewed and stable Respiratory status: respiratory function stable Cardiovascular status: stable Postop Assessment: no apparent nausea or vomiting Anesthetic complications: no   No notable events documented.  Veda Canning

## 2021-11-29 NOTE — Transfer of Care (Signed)
Immediate Anesthesia Transfer of Care Note  Patient: Ryan Garcia  Procedure(s) Performed: CATARACT EXTRACTION PHACO AND INTRAOCULAR LENS PLACEMENT (IOC) RIGHT DIABETIC (Right: Eye)  Patient Location: PACU  Anesthesia Type: MAC  Level of Consciousness: awake, alert  and patient cooperative  Airway and Oxygen Therapy: Patient Spontanous Breathing and Patient connected to supplemental oxygen  Post-op Assessment: Post-op Vital signs reviewed, Patient's Cardiovascular Status Stable, Respiratory Function Stable, Patent Airway and No signs of Nausea or vomiting  Post-op Vital Signs: Reviewed and stable  Complications: No notable events documented.

## 2021-12-06 DIAGNOSIS — R0602 Shortness of breath: Secondary | ICD-10-CM | POA: Diagnosis not present

## 2021-12-07 ENCOUNTER — Telehealth: Payer: Self-pay

## 2021-12-07 LAB — BASIC METABOLIC PANEL
BUN/Creatinine Ratio: 18 (ref 10–24)
BUN: 22 mg/dL (ref 8–27)
CO2: 25 mmol/L (ref 20–29)
Calcium: 9.5 mg/dL (ref 8.6–10.2)
Chloride: 105 mmol/L (ref 96–106)
Creatinine, Ser: 1.24 mg/dL (ref 0.76–1.27)
Glucose: 217 mg/dL — ABNORMAL HIGH (ref 70–99)
Potassium: 4.6 mmol/L (ref 3.5–5.2)
Sodium: 144 mmol/L (ref 134–144)
eGFR: 57 mL/min/{1.73_m2} — ABNORMAL LOW (ref >59)

## 2021-12-07 NOTE — Chronic Care Management (AMB) (Signed)
Chronic Care Management Pharmacy Assistant   Name: Ryan Garcia  MRN: [id number] DOB: 1934/01/20   Reason for Encounter:Diabetes Disease State    Recent office visits:  11/21/21-Marne Tower,MD(PCP)-acute right shoulder pain ,left arm numb.try wrist splint at night, OTC voltaren gel as needed, update in 1 week. 09/22/21-Marne Tower,MD(PCP)-AWV,discussed vaccines screenings,  Recent consult visits:  11/29/21-Chad Brasington,MD(Ophthalmology) right cataract extraction-no admission 11/23/21-Brain Crenshaw,MD(cardio) f/u CAD,Stop aspirin,start furosemide 50m take 1 tablet daily,future labs ordered ( no change in medications per cardio)  Hospital visits:  None in previous 6 months  Medications: Outpatient Encounter Medications as of 12/07/2021  Medication Sig   acetaminophen (TYLENOL) 325 MG tablet Take 325-650 mg by mouth every 6 (six) hours as needed for mild pain or headache.   Alcohol Swabs (B-D SINGLE USE SWABS REGULAR) PADS Use to check blood sugar 2 times a day   apixaban (ELIQUIS) 5 MG TABS tablet Take 1 tablet (5 mg total) by mouth 2 (two) times daily.   Blood Glucose Calibration (TRUE METRIX LEVEL 1) Low SOLN Use to check control on glucose meter   Blood Glucose Monitoring Suppl (TRUE METRIX AIR GLUCOSE METER) w/Device KIT 1 kit by Other route 2 (two) times daily. Check blood sugar twice daily and as directed. Dx E11.65   cetirizine (ZYRTEC) 10 MG tablet TAKE 1 TABLET EVERY DAY   Coenzyme Q10 (CO Q 10 PO) Take 200 mg by mouth daily.   Cyanocobalamin (VITAMIN B12) 1000 MCG TBCR Take 1,000 mcg by mouth daily.   fluticasone (FLONASE) 50 MCG/ACT nasal spray Place 1 spray into both nostrils daily as needed for allergies.    furosemide (LASIX) 20 MG tablet Take 1 tablet (20 mg total) by mouth daily.   glipiZIDE (GLUCOTROL) 10 MG tablet TAKE 1 TABLET TWICE DAILY WITH MEALS   glucose blood (TRUE METRIX BLOOD GLUCOSE TEST) test strip CHECK BLOOD SUGAR TWICE DAILY   insulin glargine  (LANTUS SOLOSTAR) 100 UNIT/ML Solostar Pen Inject 50 Units into the skin at bedtime.   Insulin Pen Needle (DROPLET PEN NEEDLES) 31G X 6 MM MISC USE ONE TIME DAILY  AT  BEDTIME  WITH  LANTUS   isosorbide mononitrate (IMDUR) 60 MG 24 hr tablet TAKE 1 TABLET EVERY DAY   loratadine (CLARITIN) 10 MG tablet Take 10 mg by mouth daily.   metFORMIN (GLUCOPHAGE) 1000 MG tablet TAKE 1 TABLET TWICE DAILY WITH MEALS   metoprolol tartrate (LOPRESSOR) 25 MG tablet TAKE 1/2 TABLET TWICE DAILY   nitroGLYCERIN (NITROSTAT) 0.4 MG SL tablet Place 1 tablet (0.4 mg total) under the tongue every 5 (five) minutes as needed for chest pain.   NOVOLOG FLEXPEN 100 UNIT/ML FlexPen Inject 16 Units into the skin. Before dinner   rosuvastatin (CRESTOR) 40 MG tablet TAKE 1 TABLET EVERY DAY (APPOINTMENT NEEDED FOR CONTINUATION OF REFILLS)   TRUEplus Lancets 30G MISC Check blood sugar twice daily and as directed. Dx E11.65   No facility-administered encounter medications on file as of 12/07/2021.      Recent Relevant Labs: Lab Results  Component Value Date/Time   HGBA1C 8.0 (H) 10/11/2020 10:11 PM   HGBA1C 7.3 (A) 07/26/2020 11:47 AM   HGBA1C 7.0 (H) 04/11/2020 06:04 PM   MICROALBUR 3.3 (H) 05/09/2010 08:21 AM   MICROALBUR 1.8 11/21/2007 08:46 AM    Kidney Function Lab Results  Component Value Date/Time   CREATININE 1.24 12/06/2021 01:44 PM   CREATININE 1.16 09/22/2021 08:45 AM   GFR 57.40 (L) 09/22/2021 08:45 AM   GFRNONAA >  60 04/28/2021 01:55 PM   GFRAA >60 07/03/2018 03:04 AM     Contacted patient on 12/14/21 to discuss diabetes disease state.   Current antihyperglycemic regimen:   Novolog 16 units  with supper   Lantus  51 units  at night  Glipizide 39m- take 1 tablet 2 times daily  Metformin 10076m-take 1 tablet 2 times daily    Patient verbally confirms he is taking the above medications as directed. Yes  What diet changes have been made to improve diabetes control?Maintains healthy eating options  for meals   What recent interventions/DTPs have been made to improve glycemic control:   Recommended scheduling PCP annual visit in April 2023 No medication changes Have there been any recent hospitalizations or ED visits since last visit with CPP? No  Patient denies hypoglycemic symptoms, including Pale, Sweaty, Shaky, Hungry, Nervous/irritable, and Vision changes  Patient denies hyperglycemic symptoms, including blurry vision, excessive thirst, fatigue, polyuria, and weakness  How often are you checking your blood sugar? twice daily  What are your blood sugars ranging?  Fasting:  12/08/21-140   12/09/21- 169     12/10/21-91 Bedtime:      228               163                300 (ate a sweet snack)   Fasting 12/11/21-140    12/12/21-129    12/13/21-130       12/14/21- 95 Bedtime           131     187     164 During the week, how often does your blood glucose drop below 70? Never  Are you checking your feet daily/regularly? Yes  Adherence Review: Is the patient currently on a STATIN medication? Yes Is the patient currently on ACE/ARB medication? No Does the patient have >5 day gap between last estimated fill dates? No  Care Gaps: Annual wellness visit in last year? Yes Most recent A1C reading:8.5 Most Recent BP reading:145/70  11/23/21  cardio office   Last eye exam / retinopathy screening:UTD Last diabetic foot exam:UTD  Counseled patient on importance of annual eye and foot exam. UTD  Star Rating Drugs:  Medication:  Last Fill: Day Supply metformin 100068m/26/23 90 Glipizide 55m38m26/23 90 Lantus   10/12/21 90 Rosuvastatin 40mg56m/23  60    CCM appointment on 03/13/22  LindsCharlene Brooke notified  VelmeAvel SensorA [place]-[phone]

## 2021-12-11 ENCOUNTER — Other Ambulatory Visit: Payer: Self-pay | Admitting: Cardiology

## 2021-12-11 ENCOUNTER — Other Ambulatory Visit: Payer: Self-pay | Admitting: Family Medicine

## 2021-12-21 DIAGNOSIS — Z961 Presence of intraocular lens: Secondary | ICD-10-CM | POA: Diagnosis not present

## 2021-12-31 ENCOUNTER — Other Ambulatory Visit: Payer: Self-pay | Admitting: Family Medicine

## 2022-01-09 ENCOUNTER — Other Ambulatory Visit: Payer: Self-pay | Admitting: Cardiology

## 2022-01-10 ENCOUNTER — Telehealth: Payer: Self-pay | Admitting: Cardiology

## 2022-01-10 NOTE — Telephone Encounter (Signed)
*  STAT* If patient is at the pharmacy, call can be transferred to refill team.   1. Which medications need to be refilled? (please list name of each medication and dose if known)  Rosuvastatin   2. Which pharmacy/location (including street and city if local pharmacy) is medication to be sent to? [facility]  3. Do they need a 30 day or 90 day supply? 90 days and refills

## 2022-01-11 MED ORDER — ROSUVASTATIN CALCIUM 40 MG PO TABS: 40.0000 mg | ORAL_TABLET | Freq: Every day | ORAL | 1 refills | Status: DC

## 2022-01-11 NOTE — Telephone Encounter (Signed)
Refill sent to pharmacy.   

## 2022-01-25 ENCOUNTER — Other Ambulatory Visit: Payer: Self-pay | Admitting: Family Medicine

## 2022-01-25 DIAGNOSIS — E1165 Type 2 diabetes mellitus with hyperglycemia: Secondary | ICD-10-CM

## 2022-01-30 DIAGNOSIS — E1142 Type 2 diabetes mellitus with diabetic polyneuropathy: Secondary | ICD-10-CM | POA: Diagnosis not present

## 2022-01-31 DIAGNOSIS — L570 Actinic keratosis: Secondary | ICD-10-CM | POA: Diagnosis not present

## 2022-01-31 DIAGNOSIS — X32XXXA Exposure to sunlight, initial encounter: Secondary | ICD-10-CM | POA: Diagnosis not present

## 2022-01-31 DIAGNOSIS — R208 Other disturbances of skin sensation: Secondary | ICD-10-CM | POA: Diagnosis not present

## 2022-01-31 DIAGNOSIS — L280 Lichen simplex chronicus: Secondary | ICD-10-CM | POA: Diagnosis not present

## 2022-01-31 DIAGNOSIS — D2261 Melanocytic nevi of right upper limb, including shoulder: Secondary | ICD-10-CM | POA: Diagnosis not present

## 2022-01-31 DIAGNOSIS — D2262 Melanocytic nevi of left upper limb, including shoulder: Secondary | ICD-10-CM | POA: Diagnosis not present

## 2022-01-31 DIAGNOSIS — D044 Carcinoma in situ of skin of scalp and neck: Secondary | ICD-10-CM | POA: Diagnosis not present

## 2022-01-31 DIAGNOSIS — Z85828 Personal history of other malignant neoplasm of skin: Secondary | ICD-10-CM | POA: Diagnosis not present

## 2022-01-31 DIAGNOSIS — D2271 Melanocytic nevi of right lower limb, including hip: Secondary | ICD-10-CM | POA: Diagnosis not present

## 2022-01-31 DIAGNOSIS — D485 Neoplasm of uncertain behavior of skin: Secondary | ICD-10-CM | POA: Diagnosis not present

## 2022-02-05 ENCOUNTER — Other Ambulatory Visit: Payer: Self-pay | Admitting: Cardiology

## 2022-02-19 DIAGNOSIS — Z794 Long term (current) use of insulin: Secondary | ICD-10-CM | POA: Diagnosis not present

## 2022-02-19 DIAGNOSIS — E1159 Type 2 diabetes mellitus with other circulatory complications: Secondary | ICD-10-CM | POA: Diagnosis not present

## 2022-02-19 DIAGNOSIS — I2510 Atherosclerotic heart disease of native coronary artery without angina pectoris: Secondary | ICD-10-CM | POA: Diagnosis not present

## 2022-02-19 DIAGNOSIS — E1142 Type 2 diabetes mellitus with diabetic polyneuropathy: Secondary | ICD-10-CM | POA: Diagnosis not present

## 2022-02-23 ENCOUNTER — Telehealth: Payer: Self-pay | Admitting: *Deleted

## 2022-02-23 NOTE — Patient Outreach (Signed)
  Care Coordination   02/23/2022 Name: Ryan Garcia MRN: [id number] DOB: 09/01/1933   Care Coordination Outreach Attempts:  An unsuccessful telephone outreach was attempted today to offer the patient information about available care coordination services as a benefit of their health plan.   Follow Up Plan:  Additional outreach attempts will be made to offer the patient care coordination information and services.   Encounter Outcome:  No Answer  Care Coordination Interventions Activated:  No   Care Coordination Interventions:  No, not indicated    Virga Haltiwanger, [place] Worker  [facility] Care Management [phone]

## 2022-03-08 ENCOUNTER — Telehealth: Payer: Self-pay

## 2022-03-08 NOTE — Chronic Care Management (AMB) (Signed)
Chronic Care Management Pharmacy Assistant   Name: Ryan Garcia  MRN: [id number] DOB: March 13, 1934  Reason for Encounter: Reminder Call   Conditions to be addressed/monitored: CAD, HTN, HLD, and DMII  Medications: Outpatient Encounter Medications as of 03/08/2022  Medication Sig   acetaminophen (TYLENOL) 325 MG tablet Take 325-650 mg by mouth every 6 (six) hours as needed for mild pain or headache.   Alcohol Swabs (B-D SINGLE USE SWABS REGULAR) PADS Use to check blood sugar 2 times a day   apixaban (ELIQUIS) 5 MG TABS tablet Take 1 tablet (5 mg total) by mouth 2 (two) times daily.   Blood Glucose Calibration (TRUE METRIX LEVEL 1) Low SOLN Use to check control on glucose meter   Blood Glucose Monitoring Suppl (TRUE METRIX AIR GLUCOSE METER) w/Device KIT 1 kit by Other route 2 (two) times daily. Check blood sugar twice daily and as directed. Dx E11.65   cetirizine (ZYRTEC) 10 MG tablet TAKE 1 TABLET EVERY DAY   Coenzyme Q10 (CO Q 10 PO) Take 200 mg by mouth daily.   Cyanocobalamin (VITAMIN B12) 1000 MCG TBCR Take 1,000 mcg by mouth daily.   DROPLET PEN NEEDLES 31G X 6 MM MISC USE ONE TIME DAILY  AT  BEDTIME  WITH  LANTUS   fluticasone (FLONASE) 50 MCG/ACT nasal spray Place 1 spray into both nostrils daily as needed for allergies.    furosemide (LASIX) 20 MG tablet Take 1 tablet (20 mg total) by mouth daily.   glipiZIDE (GLUCOTROL) 10 MG tablet TAKE 1 TABLET TWICE DAILY WITH MEALS   insulin glargine (LANTUS SOLOSTAR) 100 UNIT/ML Solostar Pen Inject 50 Units into the skin at bedtime.   isosorbide mononitrate (IMDUR) 60 MG 24 hr tablet TAKE 1 TABLET EVERY DAY   loratadine (CLARITIN) 10 MG tablet Take 10 mg by mouth daily.   metFORMIN (GLUCOPHAGE) 1000 MG tablet TAKE 1 TABLET TWICE DAILY WITH MEALS   metoprolol tartrate (LOPRESSOR) 25 MG tablet Take 0.5 tablets (12.5 mg total) by mouth 2 (two) times daily.   nitroGLYCERIN (NITROSTAT) 0.4 MG SL tablet Place 1 tablet (0.4 mg total) under the  tongue every 5 (five) minutes as needed for chest pain.   NOVOLOG FLEXPEN 100 UNIT/ML FlexPen Inject 16 Units into the skin. Before dinner   rosuvastatin (CRESTOR) 40 MG tablet Take 1 tablet (40 mg total) by mouth daily.   TRUE METRIX BLOOD GLUCOSE TEST test strip CHECK BLOOD SUGAR TWICE DAILY   TRUEplus Lancets 30G MISC Check blood sugar twice daily and as directed. Dx E11.65   No facility-administered encounter medications on file as of 03/08/2022.  Ryan Garcia was contacted to remind of upcoming telephone visit with Charlene Brooke on 03/13/22 at 8:45. Patient was reminded to have any blood glucose and blood pressure readings available for review at appointment.   Patient confirmed appointment.  Patient has hearing aids that are being repaired-Wife Lelan Pons will help with hearing issues, Also, FYI  03/13/22 is one year anniversary of daughters death.  Are you having any problems with your medications? No   Do you have any concerns you like to discuss with the pharmacist? No  CCM referral has been placed prior to visit?  No   Star Rating Drugs: Medication:  Last Fill: Day Supply Glipizide 52m 02/27/22 90 Lantus   02/12/22 90 Metformin 10056m7/8/23  90 Novolog  02/10/22 90 Rosuvastatin 4074m/12/23 90   Recommendations/Changes made from today's visit: -No med changes -Recommended scheduling PCP annual visit  in April 2023   Plan: -[place] will call patient 3 months for BG log -Pharmacist follow up televisit scheduled for 6 months  Charlene Brooke, CPP notified  Avel Sensor, Laura  [phone]

## 2022-03-09 DIAGNOSIS — D044 Carcinoma in situ of skin of scalp and neck: Secondary | ICD-10-CM | POA: Diagnosis not present

## 2022-03-09 DIAGNOSIS — L905 Scar conditions and fibrosis of skin: Secondary | ICD-10-CM | POA: Diagnosis not present

## 2022-03-13 ENCOUNTER — Ambulatory Visit: Payer: Medicare HMO | Admitting: Pharmacist

## 2022-03-13 DIAGNOSIS — I10 Essential (primary) hypertension: Secondary | ICD-10-CM

## 2022-03-13 DIAGNOSIS — I25119 Atherosclerotic heart disease of native coronary artery with unspecified angina pectoris: Secondary | ICD-10-CM

## 2022-03-13 DIAGNOSIS — I4891 Unspecified atrial fibrillation: Secondary | ICD-10-CM

## 2022-03-13 DIAGNOSIS — E1169 Type 2 diabetes mellitus with other specified complication: Secondary | ICD-10-CM

## 2022-03-13 DIAGNOSIS — E1165 Type 2 diabetes mellitus with hyperglycemia: Secondary | ICD-10-CM

## 2022-03-13 NOTE — Progress Notes (Signed)
Chronic Care Management Pharmacy Note  03/20/2022 Name:  Ryan Garcia MRN:  [id number] DOB:  [birth year]  Summary: CCM F/U visit -DM control improving - A1c 7.8% (01/30/22 per endocrine); pt affirms compliance with regimen -BP elevated in recent OV (160/68, 155/68), pt is not checking at home  Recommendations/Changes made from today's visit: -No med changes; recommend to monitor BP at home and inform provider if consistently > 150/90  Plan: -[place] will call patient 3 months for BP update -Pharmacist follow up televisit scheduled for 6 months -PCP annual due 08/2022. Not yet scheduled.    Subjective: Ryan Garcia is an 88 y.o. year old male who is a primary patient of Tower, Wynelle Fanny, MD.  The CCM team was consulted for assistance with disease management and care coordination needs.    Engaged with patient by telephone for follow up visit in response to provider referral for pharmacy case management and/or care coordination services.   Consent to Services:  The patient was given information about Chronic Care Management services, agreed to services, and gave verbal consent prior to initiation of services.  Please see initial visit note for detailed documentation.   Patient Care Team: Tower, Wynelle Fanny, MD as PCP - General Stanford Breed, Denice Bors, MD as PCP - Cardiology (Cardiology) Leandrew Koyanagi, MD as Referring Physician (Ophthalmology) Stanford Breed Denice Bors, MD as Consulting Physician (Cardiology) Dasher, Rayvon Char, MD as Consulting Physician (Dermatology) Salomon Fick., MD as Referring Physician (Dentistry) Charlton Haws, [facility] as Pharmacist (Pharmacist)  Social: Patient lives at home with his wife. Their daughter passed away 2021/04/15.  Recent office visits: 11/21/21 Dr Glori Bickers OV: acute - shoulder pain. Try voltaren gel. Try wrist splint for carpal tunnel. 09/22/21 Dr Glori Bickers OV: annual - TSH 8, c/w subclinical hypothyroidism - will monitor. 06/29/21 LPN Tamina  McCain - AWV 10/26/20 Dr Glori Bickers OV: hospital f/u; A1c up, referred to endocrine. Cr improved but not at baseline yet.  Recent consult visits: 02/19/22 PA Gonzalez-Hernandez (Endocrine): target A1c < 7.5%; Increase Lantus to 54 units. Monitor sugar TID.  11/23/21 Dr Stanford Breed (Cardiology): f/u CAD, Afib. D/C aspirin d/t Eliquis. Diastolic HF - add lasix 20 mg daily. BMP 1 week.  09/06/21 Dr Gabriel Carina (Endocrine): f/u DM. Adjust Lantus to 50 units HS. Adust Novolog to 16 units before supper. Last eye exam 08/2020.   08/23/21 Dr Diamantina Providence (Urology): f/u renal mass, stable per MRI. No med changes. 07/31/21 Dr Evorn Gong (Dermatology): f/u SCC. Biopsy.  05/24/21 Dr Gabriel Carina (Endocrine): f/u DM. Stop Ozempic due to cost. Start Novolog 10 units with supper.  Hospital visits: 04/28/21 ED visit - rectal bleeding. HGB WNL. Likely anal fissure/hemorrhoid. D/C w/ hydrocortisone cream. Referred to GI.   Objective:  Lab Results  Component Value Date   CREATININE 1.24 12/06/2021   BUN 22 12/06/2021   GFR 57.40 (L) 09/22/2021   EGFR 57 (L) 12/06/2021   GFRNONAA >60 04/28/2021   GFRAA >60 07/03/2018   NA 144 12/06/2021   K 4.6 12/06/2021   CALCIUM 9.5 12/06/2021   CO2 25 12/06/2021   GLUCOSE 217 (H) 12/06/2021    Lab Results  Component Value Date/Time   HGBA1C 8.0 (H) 10/11/2020 10:11 PM   HGBA1C 7.3 (A) 07/26/2020 11:47 AM   HGBA1C 7.0 (H) 04/11/2020 06:04 PM   GFR 57.40 (L) 09/22/2021 08:45 AM   GFR 58.98 (L) 11/02/2020 10:39 AM   MICROALBUR 3.3 (H) 05/09/2010 08:21 AM   MICROALBUR 1.8 11/21/2007 08:46 AM    Last  diabetic Eye exam:  Lab Results  Component Value Date/Time   HMDIABEYEEXA Retinopathy (A) 10/05/2021 12:00 AM    Last diabetic Foot exam:  Lab Results  Component Value Date/Time   HMDIABFOOTEX yes 09/06/2010 12:00 AM     Lab Results  Component Value Date   CHOL 112 09/22/2021   HDL 37.70 (L) 09/22/2021   LDLCALC 57 09/22/2021   LDLDIRECT 78.5 11/17/2012   TRIG 88.0 09/22/2021    CHOLHDL 3 09/22/2021       Latest Ref Rng & Units 09/22/2021    8:45 AM 10/11/2020    5:37 PM 05/25/2020   11:57 AM  Hepatic Function  Total Protein 6.0 - 8.3 g/dL 6.4  4.9  6.5   Albumin 3.5 - 5.2 g/dL 3.9  2.8  3.9   AST 0 - 37 U/L _0 ALT 0 - 53 U/L _1 Alk Phosphatase 39 - 117 U/L 70  54  78   Total Bilirubin 0.2 - 1.2 mg/dL 0.6  0.7  0.7   Bilirubin, Direct 0.0 - 0.3 mg/dL   0.2     Lab Results  Component Value Date/Time   TSH 8.32 (H) 09/22/2021 08:45 AM   TSH 8.470 (H) 10/12/2020 08:54 AM   TSH 5.58 (H) 01/18/2020 10:10 AM   FREET4 0.68 09/22/2021 08:45 AM   FREET4 0.74 10/12/2020 08:54 AM       Latest Ref Rng & Units 09/22/2021    8:45 AM 04/28/2021    1:55 PM 10/13/2020    2:57 AM  CBC  WBC 4.0 - 10.5 K/uL 6.6  7.0  8.0   Hemoglobin 13.0 - 17.0 g/dL 13.1  14.5  14.4   Hematocrit 39.0 - 52.0 % 39.1  43.6  43.1   Platelets 150.0 - 400.0 K/uL 168.0  160  158     No results found for: "VD25OH"  Clinical ASCVD: Yes  The ASCVD Risk score (Arnett DK, et al., 2019) failed to calculate for the following reasons:   The 2019 ASCVD risk score is only valid for ages 61 to 26   The patient has a prior MI or stroke diagnosis       06/29/2021   11:29 AM 01/18/2020    9:33 AM 01/16/2019    9:10 AM  Depression screen PHQ 2/9  Decreased Interest 0 0 0  Down, Depressed, Hopeless 0 0 0  PHQ - 2 Score 0 0 0    CHA2DS2/VAS Stroke Risk Points  Current as of yesterday     5 >= 2 Points: High Risk  1 - 1.99 Points: Medium Risk  0 Points: Low Risk    No Change     Points Metrics  0 Has Congestive Heart Failure:  No    Current as of yesterday  1 Has Vascular Disease:  Yes    Current as of yesterday  1 Has Hypertension:  Yes    Current as of yesterday  2 Age:  48    Current as of yesterday  1 Has Diabetes:  Yes    Current as of yesterday  0 Had Stroke:  No  Had TIA:  No  Had Thromboembolism:  No    Current as of yesterday  0 Male:  No    Current  as of yesterday    Social History   Tobacco Use  Smoking Status Never   Passive exposure: Never  Smokeless Tobacco Never  BP Readings from Last 3 Encounters:  11/29/21 (!) 155/68  11/23/21 (!) 145/70  11/21/21 (!) 152/80   Pulse Readings from Last 3 Encounters:  11/29/21 69  11/23/21 85  11/21/21 69   Wt Readings from Last 3 Encounters:  11/29/21 260 lb 9.6 oz (118.2 kg)  11/23/21 262 lb 12.8 oz (119.2 kg)  11/21/21 260 lb 4 oz (118 kg)   BMI Readings from Last 3 Encounters:  11/29/21 36.35 kg/m  11/23/21 36.65 kg/m  11/21/21 36.81 kg/m    Assessment/Interventions: Review of patient past medical history, allergies, medications, health status, including review of consultants reports, laboratory and other test data, was performed as part of comprehensive evaluation and provision of chronic care management services.   SDOH:  (Social Determinants of Health) assessments and interventions performed: No - recently assessed at [facility] 06/2021 SDOH Interventions    Flowsheet Row Chronic Care Management from 08/31/2020 in [place] at [place] Interventions   Financial Strain Interventions Other (Comment)  [Apply for Lantus patient [place]: No Food Insecurity (06/29/2021)  Housing: Low Risk  (06/29/2021)  Transportation Needs: No Transportation Needs (06/29/2021)  Alcohol Screen: Low Risk  (06/29/2021)  Depression (PHQ2-9): Low Risk  (06/29/2021)  Financial Resource Strain: Low Risk  (06/29/2021)  Physical Activity: Inactive (06/29/2021)  Social Connections: Moderately Integrated (06/29/2021)  Stress: No Stress Concern Present (06/29/2021)  Tobacco Use: Low Risk  (11/29/2021)    CCM Care Plan  Allergies  Allergen Reactions   Loratadine Other (See Comments)    Not effective   Simvastatin Other (See Comments)    Intolerant- caused joint pain    Medications Reviewed Today     Reviewed by Karma Greaser, RN (Registered Nurse) on 11/29/21 at [place] List Status: <None>   Medication Order Taking? Sig Documenting Provider Last Dose Status Informant  acetaminophen (TYLENOL) 325 MG tablet [id number] Yes Take 325-650 mg by mouth every 6 (six) hours as needed for mild pain or headache. [provider] Past Week Active Multiple Informants  Alcohol Swabs (B-D SINGLE USE SWABS REGULAR) PADS [id number]  Use to check blood sugar 2 times a day Tower, Wynelle Fanny, MD  Active   apixaban (ELIQUIS) 5 MG TABS tablet [id number] Yes Take 1 tablet (5 mg total) by mouth 2 (two) times daily. Lelon Perla, MD 11/28/2021 Active   Blood Glucose Calibration (TRUE METRIX LEVEL 1) Low SOLN [id number]  Use to check control on glucose meter Tower, Wynelle Fanny, MD  Active   Blood Glucose Monitoring Suppl (TRUE METRIX AIR GLUCOSE METER) w/Device KIT [id number]  1 kit by Other route 2 (two) times daily. Check blood sugar twice daily and as directed. Dx E11.65 Abner Greenspan, MD  Active   cetirizine (ZYRTEC) 10 MG tablet [id number] Yes TAKE 1 TABLET EVERY DAY Tower, Wynelle Fanny, MD  Active   Coenzyme Q10 (CO Q 10 PO) [id number] Yes Take 200 mg by mouth daily. [provider] 11/28/2021 Active Multiple Informants  Cyanocobalamin (VITAMIN B12) 1000 MCG TBCR [id number] Yes Take 1,000 mcg by mouth daily. Reino Bellis B, NP 11/28/2021 Active Multiple Informants  fluticasone (FLONASE) 50 MCG/ACT nasal spray [id number] Yes Place 1 spray into both nostrils daily as needed for allergies.  [provider] 11/28/2021 Active Multiple Informants  furosemide (LASIX) 20 MG tablet [id number] Yes Take 1 tablet (20 mg total) by mouth daily. Lelon Perla, MD 11/28/2021 Active   glipiZIDE (  GLUCOTROL) 10 MG tablet [id number] Yes TAKE 1 TABLET TWICE DAILY WITH MEALS Tower, [place] A, MD 11/28/2021 Active   glucose blood (TRUE METRIX BLOOD GLUCOSE TEST) test strip [id number] Yes CHECK BLOOD SUGAR TWICE DAILY Tower, Wynelle Fanny, MD 11/28/2021 Active    insulin glargine (LANTUS SOLOSTAR) 100 UNIT/ML Solostar Pen [id number] Yes Inject 50 Units into the skin at bedtime. Tower, Wynelle Fanny, MD 11/28/2021 Active   Insulin Pen Needle (DROPLET PEN NEEDLES) 31G X 6 MM MISC [id number] Yes USE ONE TIME DAILY  AT  BEDTIME  WITH  LANTUS Tower, Wynelle Fanny, MD 11/28/2021 Active   isosorbide mononitrate (IMDUR) 60 MG 24 hr tablet [id number] Yes TAKE 1 TABLET EVERY DAY Lelon Perla, MD 11/28/2021 Active   loratadine (CLARITIN) 10 MG tablet [id number] Yes Take 10 mg by mouth daily. [provider] 11/28/2021 Active   metFORMIN (GLUCOPHAGE) 1000 MG tablet [id number] Yes TAKE 1 TABLET TWICE DAILY WITH MEALS Tower, [place] A, MD 11/28/2021 Active   metoprolol tartrate (LOPRESSOR) 25 MG tablet [id number] Yes TAKE 1/2 TABLET TWICE DAILY Lelon Perla, MD 11/28/2021 Active   nitroGLYCERIN (NITROSTAT) 0.4 MG SL tablet [id number]  Place 1 tablet (0.4 mg total) under the tongue every 5 (five) minutes as needed for chest pain. Ledora Bottcher, PA  Active Multiple Informants  NOVOLOG FLEXPEN 100 UNIT/ML FlexPen [id number] Yes Inject 16 Units into the skin. Before dinner [provider] 11/28/2021 Active   rosuvastatin (CRESTOR) 40 MG tablet [id number] Yes TAKE 1 TABLET EVERY DAY (APPOINTMENT NEEDED FOR CONTINUATION OF REFILLS) Lelon Perla, MD 11/28/2021 Active   TRUEplus Lancets 30G MISC [id number] Yes Check blood sugar twice daily and as directed. Dx E11.65 Abner Greenspan, MD 11/28/2021 Active             Patient Active Problem List   Diagnosis Date Noted   Right shoulder pain 11/21/2021   Carpal tunnel syndrome 11/21/2021   Atrial flutter with rapid ventricular response (HCC)    Rapid atrial fibrillation ([place]) 10/11/2020   Poor balance 07/26/2020   Anemia 04/20/2020   Renal mass, right 04/18/2020   AKI (acute kidney injury) ([place]) 04/14/2020   Cholangitis 04/14/2020   Unstable angina (HCC)    Chest pain 04/11/2020   Coronary artery disease involving  native coronary artery of native heart without angina pectoris 01/18/2019   Medicare annual wellness visit, subsequent 01/16/2019   Obesity (BMI 30-39.9) 07/09/2018   HOH (hard of hearing) 03/31/2018   Uncontrolled type 2 diabetes mellitus with hyperglycemia ([place]) 10/28/2017   Subclinical hypothyroidism 10/28/2017   Scoliosis    Obstructive sleep apnea on CPAP    Hx of skin cancer, basal cell    Hyperlipidemia associated with type 2 diabetes mellitus (HCC)    GERD (gastroesophageal reflux disease)    Chronic upper back pain    Arthritis    Allergy    NSTEMI (non-ST elevated myocardial infarction) ([place]) 06/18/2017   Actinic keratoses 04/29/2017   Positive colorectal cancer screening using Cologuard test 10/31/2016   BPH (benign prostatic hyperplasia) 09/04/2016   B12 deficiency 11/22/2015   Routine general medical examination at a health care facility 08/26/2015   Abnormal nuclear cardiac imaging test    Dyspnea on exertion 11/27/2013   Encounter for examination of normal volunteer in research study 05/29/2013   Prostate cancer screening 07/15/2012   CAD S/P percutaneous coronary angioplasty 05/17/2011   Nonspecific abnormal results of cardiovascular function study 04/30/2011   Abnormal EKG 04/06/2011   Right low back  pain 01/13/2010   Insulin dependent diabetes mellitus 10/04/2006   ERECTILE DYSFUNCTION 10/04/2006   Primary hypertension 10/04/2006   Allergic rhinitis 10/04/2006   OSTEOARTHRITIS 10/04/2006   Sleep apnea 10/04/2006   EDEMA 10/04/2006   SKIN CANCER, HX OF 10/04/2006    Immunization History  Administered Date(s) Administered   Fluad Quad(high Dose 65+) 06/23/2019, 04/20/2020   Influenza Split 07/09/2011   Influenza Whole 06/01/2002, 05/22/2007   Influenza, Seasonal, Injecte, Preservative Fre 07/23/2012   Influenza,inj,Quad PF,6+ Mos 05/29/2013, 03/01/2014, 08/26/2015, 05/29/2016, 04/29/2017, 05/01/2018   PFIZER(Purple Top)SARS-COV-2 Vaccination 09/14/2019,  10/05/2019, 06/03/2020   Pneumococcal Conjugate-13 10/04/2014   Pneumococcal Polysaccharide-23 06/08/2002, 05/22/2007   Td 01/22/2001, 08/16/2020   Tdap 07/09/2011   Zoster, Live 11/21/2012    Conditions to be addressed/monitored:  Hypertension, Hyperlipidemia, Diabetes, Atrial Fibrillation, and Coronary Artery Disease  Care Plan : [place]  Updates made by Charlton Haws, [place] since 03/20/2022 12:00 AM     Problem: Hypertension, Hyperlipidemia, Diabetes, Atrial Fibrillation, and Coronary Artery Disease   Priority: High     Goal: Disease Management   Start Date: 08/31/2020  Expected End Date: 09/13/2022  This Visit's Progress: On track  Recent Progress: On track  Priority: High  Note:   Current Barriers:  Suboptimal DM control  Pharmacist Clinical Goal(s):  Patient will contact provider office for questions/concerns as evidenced notation of same in electronic health record through collaboration with PharmD and provider.   Interventions: 1:1 collaboration with Tower, Wynelle Fanny, MD regarding development and update of comprehensive plan of care as evidenced by provider attestation and co-signature Inter-disciplinary care team collaboration (see longitudinal plan of care) Comprehensive medication review performed; medication list updated in electronic medical record  Hypertension (BP goal < 140/90) -Uncontrolled - BP 155/68, 160/80 in recent OV; he is not checking BP often at home -Current home BP readings: n/a -Current treatment: Metoprolol tartate 25 mg - 1/2 BID - Appropriate, Query Effective Isosorbide Mononitrate 60 mg daily AM - Appropriate, Query Effective Furosemide 20 mg daily - Appropriate, Query Effective -Medications previously tried: HCTZ, lisinopril (AKI) -Current exercise habits:  Mowing lawn (13 acres); cutting logs -Educated on Importance of home blood pressure monitoring; Proper BP monitoring technique; -Counseled to monitor BP at home every  other day; contact provider if BP consistently > 150/90 -Recommended to continue current medication  Atrial Fibrillation (Goal: prevent stroke and major bleeding) -Controlled - per pt report -CHADSVASC: 5 -Current treatment: Metoprolol tartrate 25 mg - 1/2 tab BID - Appropriate, Effective, Safe, Accessible Eliquis 5 mg BID - Appropriate, Effective, Safe, Accessible -Medications previously tried: n/a -Counseled on increased risk of stroke due to Afib and benefits of anticoagulation for stroke prevention; importance of adherence to anticoagulant exactly as prescribed; -Recommended to continue current medication  Hyperlipidemia/CAD: (LDL goal < 70) -Controlled - LDL 57 (08/2021); pt reports he had cramps with statin previously and COQ10 has helped -Hx NSTEMI 06/2017; no aspirin due to Eliquis -Current treatment: Rosuvastatin 40 mg daily - Appropriate, Effective, Safe, Accessible CoQ10 daily - Appropriate, Effective, Safe, Accessible Nitroglycerin 0.4 mg SL prn - Appropriate, Effective, Safe, Accessible -Medications previously tried: Lipitor (myopathy), pravastatin, simvastatin -Educated on Benefits of statin for ASCVD risk reduction; -Recommended to continue current medication  Diabetes (A1c goal < 7.5%) -Not ideally controlled, improving -  A1c 7.8% (01/2022) -[facility]s with [facility] (Dr Gabriel Carina) -Current home glucose readings: checks before breakfast and 2-4 hours after supper  fasting: 120-180 post prandial: 268, 237, 129, 175, 232 -Current medications:  Metformin 1000 mg BID w/ meals - Appropriate, Query Effective Lantus 54 units HS - Appropriate, Query Effective Novolog 16 units w/ supper -Appropriate, Query Effective Glipizide 10 mg BID w/ meals -Appropriate, Query Effective -Medications previously tried: Ozempic (cost) -Educated on A1c and blood sugar goals; Benefits of routine self-monitoring of blood sugar; -Recommended to continue current medication; keep regular f/u  with endocrine  Patient Goals/Self-Care Activities Patient will:  - take medications as prescribed as evidenced by patient report and record review focus on medication adherence by routine check glucose twice daily, document, and provide at future appointments check blood pressure 2-3x weekly, document, and provide at future appointments      Medication Assistance: None required.  Patient affirms current coverage meets needs.  Compliance/Adherence/Medication fill history: Care Gaps: None  Star-Rating Drugs: Glipizide - PDC 61%; LF 06/28/21 X 90DS Metformin - PDC 100% Rosuvastatin - PDC 76%; LF 06/28/21 X 90DS  Medication Access: Within the past 30 days, how often has patient missed a dose of medication? 0 Is a pillbox or other method used to improve adherence? Yes  Factors that may affect medication adherence? no barriers identified Are meds synced by current pharmacy? No  Are meds delivered by current pharmacy? Yes  Does patient experience delays in picking up medications due to transportation concerns? No   [facility] Reviewed: Is patient disadvantaged to use [facility]?: Yes  Current Rx insurance plan: [employer] Name and location of Current pharmacy:  [facility] - [place], [place] [address] Phone: [phone] Fax: [place] #[id number] [place], [place] - [place] AT [address] [place] [address] Phone: [phone] Fax: [phone]  [facility] services reviewed with patient today?: No  Patient requests to transfer care to [facility]?: No  Reason patient declined to change pharmacies: Disadvantaged due to insurance/mail order   Care Plan and Follow Up Patient Decision:  Patient agrees to Care Plan and Follow-up.  Plan: Telephone follow up appointment with care management team member scheduled  for:  6 months  Charlene Brooke, PharmD, BCACP Clinical Pharmacist [facility] at [facility] [phone]

## 2022-03-20 NOTE — Patient Instructions (Signed)
Visit Information  Phone number for Pharmacist: [phone]   Goals Addressed   None     Care Plan : [place]  Updates made by Charlton Haws, RPH since 03/20/2022 12:00 AM     Problem: Hypertension, Hyperlipidemia, Diabetes, Atrial Fibrillation, and Coronary Artery Disease   Priority: High     Goal: Disease Management   Start Date: 08/31/2020  Expected End Date: 09/13/2022  This Visit's Progress: On track  Recent Progress: On track  Priority: High  Note:   Current Barriers:  Suboptimal DM control  Pharmacist Clinical Goal(s):  Patient will contact provider office for questions/concerns as evidenced notation of same in electronic health record through collaboration with PharmD and provider.   Interventions: 1:1 collaboration with Tower, Wynelle Fanny, MD regarding development and update of comprehensive plan of care as evidenced by provider attestation and co-signature Inter-disciplinary care team collaboration (see longitudinal plan of care) Comprehensive medication review performed; medication list updated in electronic medical record  Hypertension (BP goal < 140/90) -Uncontrolled - BP 155/68, 160/80 in recent OV; he is not checking BP often at home -Current home BP readings: n/a -Current treatment: Metoprolol tartate 25 mg - 1/2 BID - Appropriate, Query Effective Isosorbide Mononitrate 60 mg daily AM - Appropriate, Query Effective Furosemide 20 mg daily - Appropriate, Query Effective -Medications previously tried: HCTZ, lisinopril (AKI) -Current exercise habits:  Mowing lawn (13 acres); cutting logs -Educated on Importance of home blood pressure monitoring; Proper BP monitoring technique; -Counseled to monitor BP at home every other day; contact provider if BP consistently > 150/90 -Recommended to continue current medication  Atrial Fibrillation (Goal: prevent stroke and major bleeding) -Controlled - per pt report -CHADSVASC: 5 -Current  treatment: Metoprolol tartrate 25 mg - 1/2 tab BID - Appropriate, Effective, Safe, Accessible Eliquis 5 mg BID - Appropriate, Effective, Safe, Accessible -Medications previously tried: n/a -Counseled on increased risk of stroke due to Afib and benefits of anticoagulation for stroke prevention; importance of adherence to anticoagulant exactly as prescribed; -Recommended to continue current medication  Hyperlipidemia/CAD: (LDL goal < 70) -Controlled - LDL 57 (08/2021); pt reports he had cramps with statin previously and COQ10 has helped -Hx NSTEMI 06/2017; no aspirin due to Eliquis -Current treatment: Rosuvastatin 40 mg daily - Appropriate, Effective, Safe, Accessible CoQ10 daily - Appropriate, Effective, Safe, Accessible Nitroglycerin 0.4 mg SL prn - Appropriate, Effective, Safe, Accessible -Medications previously tried: Lipitor (myopathy), pravastatin, simvastatin -Educated on Benefits of statin for ASCVD risk reduction; -Recommended to continue current medication  Diabetes (A1c goal < 7.5%) -Not ideally controlled, improving -  A1c 7.8% (01/2022) -Follows with [facility] (Dr Gabriel Carina) -Current home glucose readings: checks before breakfast and 2-4 hours after supper  fasting: 120-180 post prandial: 268, 237, 129, 175, 232 -Current medications: Metformin 1000 mg BID w/ meals - Appropriate, Query Effective Lantus 54 units HS - Appropriate, Query Effective Novolog 16 units w/ supper -Appropriate, Query Effective Glipizide 10 mg BID w/ meals -Appropriate, Query Effective -Medications previously tried: Ozempic (cost) -Educated on A1c and blood sugar goals; Benefits of routine self-monitoring of blood sugar; -Recommended to continue current medication; keep regular f/u with endocrine  Patient Goals/Self-Care Activities Patient will:  - take medications as prescribed as evidenced by patient report and record review focus on medication adherence by routine check glucose twice daily,  document, and provide at future appointments check blood pressure 2-3x weekly, document, and provide at future appointments      Patient verbalizes understanding of instructions and care  plan provided today and agrees to view in [facility]. Active MyChart status and patient understanding of how to access instructions and care plan via MyChart confirmed with patient.    Telephone follow up appointment with pharmacy team member scheduled for: 6 months  Charlene Brooke, PharmD, [facility] Clinical Pharmacist [facility] at [facility] [phone]

## 2022-05-02 NOTE — Progress Notes (Signed)
HPI: FU CAD.  Cardiac catheterization in the past has revealed significant three-vessel coronary artery disease.  I reviewed those films with Dr. Percival Spanish and Dr. Lia Foyer and we felt medical therapy best option (pt also wanted to avoid CABG). Patient admitted December 2018 and ruled in for non-ST elevation myocardial infarction.  Patient underwent repeat catheterization revealing severe three-vessel coronary disease.  Left-sided disease was unchanged compared to previous catheterizations and the culprit was felt to be a new RCA lesion and he had PCI with DES. Cardiac catheterization October 2021 showed 85% proximal LAD, 85% 1st diagonal, 75% mid LAD, 65% distal LAD, 80% circumflex, 95% 1st marginal, 75% 2nd marginal, 60% PDA, 70% posterior lateral, patent stent in the right coronary artery.  There were no targets for PCI.  Medical therapy recommended and can consider coronary artery bypass graft if refractory angina. Echocardiogram April 2022 showed normal LV function with basal inferior hypokinesis, severe LVH.  TEE April 2022 showed normal LV function, mild RV dysfunction, no left atrial appendage thrombus, mild right atrial enlargement, small pericardial effusion, mild mitral regurgitation, mild to moderate tricuspid regurgitation. Since last seen, he has dyspnea exertion but no, PND, syncope.  He has occasional minimal pedal edema.  He occasionally has chest pain that is brief and unchanged by his report.  Last several minutes and resolve spontaneously.  Current Outpatient Medications  Medication Sig Dispense Refill   acetaminophen (TYLENOL) 325 MG tablet Take 325-650 mg by mouth every 6 (six) hours as needed for mild pain or headache.     Alcohol Swabs (B-D SINGLE USE SWABS REGULAR) PADS Use to check blood sugar 2 times a day 200 each 3   apixaban (ELIQUIS) 5 MG TABS tablet Take 1 tablet (5 mg total) by mouth 2 (two) times daily. 180 tablet 1   Blood Glucose Calibration (TRUE METRIX LEVEL 1) Low  SOLN Use to check control on glucose meter 1 each 3   Blood Glucose Monitoring Suppl (TRUE METRIX AIR GLUCOSE METER) w/Device KIT 1 kit by Other route 2 (two) times daily. Check blood sugar twice daily and as directed. Dx E11.65 1 kit 0   cetirizine (ZYRTEC) 10 MG tablet TAKE 1 TABLET EVERY DAY 90 tablet 1   Coenzyme Q10 (CO Q 10 PO) Take 200 mg by mouth daily.     Cyanocobalamin (VITAMIN B12) 1000 MCG TBCR Take 1,000 mcg by mouth daily. 30 tablet    DROPLET PEN NEEDLES 31G X 6 MM MISC USE ONE TIME DAILY  AT  BEDTIME  WITH  LANTUS 100 each 3   fluticasone (FLONASE) 50 MCG/ACT nasal spray Place 1 spray into both nostrils daily as needed for allergies.      furosemide (LASIX) 20 MG tablet Take 1 tablet (20 mg total) by mouth daily. 90 tablet 3   glipiZIDE (GLUCOTROL) 10 MG tablet TAKE 1 TABLET TWICE DAILY WITH MEALS 180 tablet 1   insulin glargine (LANTUS SOLOSTAR) 100 UNIT/ML Solostar Pen Inject 50 Units into the skin at bedtime. 45 mL 3   isosorbide mononitrate (IMDUR) 60 MG 24 hr tablet TAKE 1 TABLET EVERY DAY 90 tablet 1   loratadine (CLARITIN) 10 MG tablet Take 10 mg by mouth daily.     metFORMIN (GLUCOPHAGE) 1000 MG tablet TAKE 1 TABLET TWICE DAILY WITH MEALS 180 tablet 2   metoprolol tartrate (LOPRESSOR) 25 MG tablet Take 0.5 tablets (12.5 mg total) by mouth 2 (two) times daily. 90 tablet 3   nitroGLYCERIN (NITROSTAT) 0.4 MG SL  tablet Place 1 tablet (0.4 mg total) under the tongue every 5 (five) minutes as needed for chest pain. 25 tablet 3   NOVOLOG FLEXPEN 100 UNIT/ML FlexPen Inject 16 Units into the skin. Before dinner     rosuvastatin (CRESTOR) 40 MG tablet Take 1 tablet (40 mg total) by mouth daily. 90 tablet 1   TRUE METRIX BLOOD GLUCOSE TEST test strip CHECK BLOOD SUGAR TWICE DAILY 200 strip 3   TRUEplus Lancets 30G MISC Check blood sugar twice daily and as directed. Dx E11.65 200 each 3   No current facility-administered medications for this visit.     Past Medical History:   Diagnosis Date   AC (acromioclavicular) joint bone spurs    lt ankle   Allergy    allergic rhinitis   Anginal pain (HCC)    Arthritis    OA   BPH (benign prostatic hyperplasia)    microwave tx of prostate   CAD (coronary artery disease)    a. s/p cath in 2012 showing 70-75% LAD stenosis, 75% D1 stenosis, 75% OM1, 60-70% RCA stenosis, and 80% PDA --> medical therapy pursued b. similar findings by cath in 2016; 06/2017 DES to distal RCA   Chronic kidney disease    Chronic upper back pain    Diabetes mellitus    type II x8 years   GERD (gastroesophageal reflux disease)    HLD (hyperlipidemia)    10/12: TC 208, TG 213, HDL 36, LDL 129   Hx of skin cancer, basal cell    many skin cancers removed/under constant treatment.   Hypertension    NSTEMI (non-ST elevated myocardial infarction) ([place]) 06/18/2017   DES to distal RCA   Obstructive sleep apnea on CPAP    Paroxysmal atrial flutter ([place])    Renal mass    Scoliosis     Past Surgical History:  Procedure Laterality Date   BREAST SURGERY  2009   breast lump removed benign   BUBBLE STUDY  10/14/2020   Procedure: BUBBLE STUDY;  Surgeon: [name], MD;  Location: [place];  Service: Cardiovascular;;   CARDIAC CATHETERIZATION N/A 03/15/2015   Procedure: Left Heart Cath and Coronary Angiography;  Surgeon: Belva Crome, MD;  Location: [place] CV LAB;  Service: Cardiovascular;  Laterality: N/A;   CARDIOVERSION N/A 10/14/2020   Procedure: CARDIOVERSION;  Surgeon: [name], MD;  Location: [facility] ENDOSCOPY;  Service: Cardiovascular;  Laterality: N/A;   CATARACT EXTRACTION W/PHACO Right 11/29/2021   Procedure: CATARACT EXTRACTION PHACO AND INTRAOCULAR LENS PLACEMENT ([place]) RIGHT DIABETIC;  Surgeon: Leandrew Koyanagi, MD;  Location: [place];  Service: Ophthalmology;  Laterality: Right;  Diabetic 18.72 01:54.3   CORONARY STENT INTERVENTION N/A 06/18/2017   Procedure: CORONARY STENT INTERVENTION;  Surgeon:  Jettie Booze, MD;  Location: [place] CV LAB;  Service: Cardiovascular;  Laterality: N/A;  RCA   LEFT HEART CATH AND CORONARY ANGIOGRAPHY N/A 06/18/2017   Procedure: LEFT HEART CATH AND CORONARY ANGIOGRAPHY;  Surgeon: Jettie Booze, MD;  Location: [place] CV LAB;  Service: Cardiovascular;  Laterality: N/A;   LEFT HEART CATH AND CORONARY ANGIOGRAPHY N/A 04/12/2020   Procedure: LEFT HEART CATH AND CORONARY ANGIOGRAPHY;  Surgeon: Martinique, Peter M, MD;  Location: [place] CV LAB;  Service: Cardiovascular;  Laterality: N/A;   TEE WITHOUT CARDIOVERSION N/A 10/14/2020   Procedure: TRANSESOPHAGEAL ECHOCARDIOGRAM (TEE);  Surgeon: [name], MD;  Location: [facility] END[facility]COPY;  Service: Cardiovascular;  Laterality: N/A;   ULTRASOUND GUIDANCE FOR VASCULAR ACCESS  06/18/2017   Procedure: Ultrasound Guidance For Vascular Access;  Surgeon: Jettie Booze, MD;  Location: [place] CV LAB;  Service: Cardiovascular;;  right radial, right femoral    Social History   Socioeconomic History   Marital status: Married    Spouse name: Kasheem Toner   Number of children: 3   Years of education: Not on file   Highest education level: Not on file  Occupational History   Not on file  Tobacco Use   Smoking status: Never    Passive exposure: Never   Smokeless tobacco: Never  Vaping Use   Vaping Use: Never used  Substance and Sexual Activity   Alcohol use: No    Alcohol/week: 0.0 standard drinks of alcohol   Drug use: No   Sexual activity: Not Currently  Other Topics Concern   Not on file  Social History Narrative   Married    Physically active hard of hearing   Social Determinants of Health   Financial Resource Strain: Low Risk  (06/29/2021)   Overall Financial Resource Strain (CARDIA)    Difficulty of Paying Living Expenses: Not hard at all  Food Insecurity: No Food Insecurity (06/29/2021)   Hunger Vital Sign    Worried About Running Out of Food in the Last Year: Never  true    [place] in the Last Year: Never true  Transportation Needs: No Transportation Needs (06/29/2021)   PRAPARE - Hydrologist (Medical): No    Lack of Transportation (Non-Medical): No  Physical Activity: Inactive (06/29/2021)   Exercise Vital Sign    Days of Exercise per Week: 0 days    Minutes of Exercise per Session: 0 min  Stress: No Stress Concern Present (06/29/2021)   [place]    Feeling of Stress : Not at all  Social Connections: Moderately Integrated (06/29/2021)   Social Connection and Isolation Panel [NHANES]    Frequency of Communication with Friends and Family: More than three times a week    Frequency of Social Gatherings with Friends and Family: More than three times a week    Attends Religious Services: More than 4 times per year    Active Member of [employer] or Organizations: No    Attends Archivist Meetings: Never    Marital Status: Married  Human resources officer Violence: Not At Risk (06/29/2021)   Humiliation, Afraid, Rape, and Kick questionnaire    Fear of Current or Ex-Partner: No    Emotionally Abused: No    Physically Abused: No    Sexually Abused: No    Family History  Problem Relation Age of Onset   Heart attack Brother    Colon cancer Neg Hx    Colon polyps Neg Hx    Stomach cancer Neg Hx    Esophageal cancer Neg Hx     ROS: no fevers or chills, productive cough, hemoptysis, dysphasia, odynophagia, melena, hematochezia, dysuria, hematuria, rash, seizure activity, orthopnea, PND, pedal edema, claudication. Remaining systems are negative.  Physical Exam: Well-developed well-nourished in no acute distress.  Skin is warm and dry.  HEENT is normal.  Neck is supple.  Chest is clear to auscultation with normal expansion.  Cardiovascular exam is regular rate and rhythm.  Abdominal exam nontender or distended. No masses palpated. Extremities  show no edema. neuro grossly intact  ECG-probable sinus rhythm with first-degree AV block, PACs, PVCs and no ST changes.  Personally reviewed  A/P  1 coronary artery disease-As outlined in previous notes he has wished to avoid coronary artery bypass and graft.  Continue medical therapy including beta-blocker, nitrates and statin.  He is not on aspirin given need for anticoagulation.  Patient has occasional brief chest pain not particular bothersome and unchanged.  2 paroxysmal atrial flutter-he remains in sinus rhythm.  We will continue apixaban and metoprolol at present dose.  3 hypertension-patient's blood pressure is controlled.  Continue present medications.  4 hyperlipidemia-continue statin.  5 chronic diastolic congestive heart failure-continue diuretic at present dose.  He is euvolemic.  6 obstructive sleep apnea-continue CPAP.  Kirk Ruths, MD

## 2022-05-09 ENCOUNTER — Encounter: Payer: Self-pay | Admitting: Cardiology

## 2022-05-09 ENCOUNTER — Ambulatory Visit: Payer: Medicare HMO | Attending: Cardiology | Admitting: Cardiology

## 2022-05-09 VITALS — BP 122/56 | HR 73 | Ht 71.0 in | Wt 253.8 lb

## 2022-05-09 DIAGNOSIS — R0602 Shortness of breath: Secondary | ICD-10-CM | POA: Diagnosis not present

## 2022-05-09 DIAGNOSIS — I2510 Atherosclerotic heart disease of native coronary artery without angina pectoris: Secondary | ICD-10-CM

## 2022-05-09 DIAGNOSIS — E7800 Pure hypercholesterolemia, unspecified: Secondary | ICD-10-CM | POA: Diagnosis not present

## 2022-05-09 DIAGNOSIS — I10 Essential (primary) hypertension: Secondary | ICD-10-CM | POA: Diagnosis not present

## 2022-05-09 NOTE — Patient Instructions (Signed)
  Follow-Up: At [facility], you and your health needs are our priority.  As part of our continuing mission to provide you with exceptional heart care, we have created designated Provider Care Teams.  These Care Teams include your primary Cardiologist (physician) and Advanced Practice Providers (APPs -  Physician Assistants and Nurse Practitioners) who all work together to provide you with the care you need, when you need it.  We recommend signing up for the patient portal called "MyChart".  Sign up information is provided on this After Visit Summary.  MyChart is used to connect with patients for Virtual Visits (Telemedicine).  Patients are able to view lab/test results, encounter notes, upcoming appointments, etc.  Non-urgent messages can be sent to your provider as well.   To learn more about what you can do with MyChart, go to [email or web address].    Your next appointment:   6 month(s)  The format for your next appointment:   In Person  Provider:   Brian Crenshaw, MD    

## 2022-05-28 DIAGNOSIS — E1159 Type 2 diabetes mellitus with other circulatory complications: Secondary | ICD-10-CM | POA: Diagnosis not present

## 2022-05-28 DIAGNOSIS — Z794 Long term (current) use of insulin: Secondary | ICD-10-CM | POA: Diagnosis not present

## 2022-05-28 DIAGNOSIS — E1142 Type 2 diabetes mellitus with diabetic polyneuropathy: Secondary | ICD-10-CM | POA: Diagnosis not present

## 2022-05-28 DIAGNOSIS — I2510 Atherosclerotic heart disease of native coronary artery without angina pectoris: Secondary | ICD-10-CM | POA: Diagnosis not present

## 2022-06-07 ENCOUNTER — Telehealth: Payer: Self-pay

## 2022-06-07 NOTE — Chronic Care Management (AMB) (Signed)
Chronic Care Management Pharmacy Assistant   Name: Ryan Garcia  MRN: [id number] DOB: 06-25-1934   Reason for Encounter:Hypertension  Disease State   Recent office visits:  None since last CCM contact  Recent consult visits:  05/28/22-Anna Solum,MD(endo)-f/u labs, A1c 7.7,monitor BG's daily,referral for nutritionist,no medication changes, f/u 4 months  05/09/22-Brian Crenshaw,MD(cardio)-f/u CAD,no medication changes, f/u 6 months  Hospital visits:  None in previous 6 months  Medications: Outpatient Encounter Medications as of 06/07/2022  Medication Sig   acetaminophen (TYLENOL) 325 MG tablet Take 325-650 mg by mouth every 6 (six) hours as needed for mild pain or headache.   Alcohol Swabs (B-D SINGLE USE SWABS REGULAR) PADS Use to check blood sugar 2 times a day   apixaban (ELIQUIS) 5 MG TABS tablet Take 1 tablet (5 mg total) by mouth 2 (two) times daily.   Blood Glucose Calibration (TRUE METRIX LEVEL 1) Low SOLN Use to check control on glucose meter   Blood Glucose Monitoring Suppl (TRUE METRIX AIR GLUCOSE METER) w/Device KIT 1 kit by Other route 2 (two) times daily. Check blood sugar twice daily and as directed. Dx E11.65   cetirizine (ZYRTEC) 10 MG tablet TAKE 1 TABLET EVERY DAY   Coenzyme Q10 (CO Q 10 PO) Take 200 mg by mouth daily.   Cyanocobalamin (VITAMIN B12) 1000 MCG TBCR Take 1,000 mcg by mouth daily.   DROPLET PEN NEEDLES 31G X 6 MM MISC USE ONE TIME DAILY  AT  BEDTIME  WITH  LANTUS   fluticasone (FLONASE) 50 MCG/ACT nasal spray Place 1 spray into both nostrils daily as needed for allergies.    furosemide (LASIX) 20 MG tablet Take 1 tablet (20 mg total) by mouth daily.   glipiZIDE (GLUCOTROL) 10 MG tablet TAKE 1 TABLET TWICE DAILY WITH MEALS   insulin glargine (LANTUS SOLOSTAR) 100 UNIT/ML Solostar Pen Inject 50 Units into the skin at bedtime.   isosorbide mononitrate (IMDUR) 60 MG 24 hr tablet TAKE 1 TABLET EVERY DAY   loratadine (CLARITIN) 10 MG tablet Take 10 mg by  mouth daily.   metFORMIN (GLUCOPHAGE) 1000 MG tablet TAKE 1 TABLET TWICE DAILY WITH MEALS   metoprolol tartrate (LOPRESSOR) 25 MG tablet Take 0.5 tablets (12.5 mg total) by mouth 2 (two) times daily.   nitroGLYCERIN (NITROSTAT) 0.4 MG SL tablet Place 1 tablet (0.4 mg total) under the tongue every 5 (five) minutes as needed for chest pain.   NOVOLOG FLEXPEN 100 UNIT/ML FlexPen Inject 16 Units into the skin. Before dinner   rosuvastatin (CRESTOR) 40 MG tablet Take 1 tablet (40 mg total) by mouth daily.   TRUE METRIX BLOOD GLUCOSE TEST test strip CHECK BLOOD SUGAR TWICE DAILY   TRUEplus Lancets 30G MISC Check blood sugar twice daily and as directed. Dx E11.65   No facility-administered encounter medications on file as of 06/07/2022.    Recent Office Vitals: BP Readings from Last 3 Encounters:  05/09/22 (!) 122/56  11/29/21 (!) 155/68  11/23/21 (!) 145/70   Pulse Readings from Last 3 Encounters:  05/09/22 73  11/29/21 69  11/23/21 85    Wt Readings from Last 3 Encounters:  05/09/22 253 lb 12.8 oz (115.1 kg)  11/29/21 260 lb 9.6 oz (118.2 kg)  11/23/21 262 lb 12.8 oz (119.2 kg)     Kidney Function Lab Results  Component Value Date/Time   CREATININE 1.24 12/06/2021 01:44 PM   CREATININE 1.16 09/22/2021 08:45 AM   GFR 57.40 (L) 09/22/2021 08:45 AM   GFRNONAA >60  04/28/2021 01:55 PM   GFRAA >60 07/03/2018 03:04 AM       Latest Ref Rng & Units 12/06/2021    1:44 PM 09/22/2021    8:45 AM 04/28/2021    1:55 PM  BMP  Glucose 70 - 99 mg/dL 217  133  252   BUN 8 - 27 mg/dL _0 Creatinine 0.76 - 1.27 mg/dL 1.24  1.16  1.17   BUN/Creat Ratio 10 - 24 18     Sodium 134 - 144 mmol/L 144  143  140   Potassium 3.5 - 5.2 mmol/L 4.6  4.2  4.1   Chloride 96 - 106 mmol/L 105  109  105   CO2 20 - 29 mmol/L _1 Calcium 8.6 - 10.2 mg/dL 9.5  8.8  8.9      Contacted patient on 06/19/22 to discuss hypertension disease state  Current antihypertensive regimen:  Metoprolol  tartate 25 mg - 1/2 tablet BID  Isosorbide Mononitrate 60 mg  1 tablet daily AM  Furosemide 20 mg  1 tablet daily   Patient verbally confirms he is taking the above medications as directed. Yes  How often are you checking your Blood Pressure? infrequently Patient states he checks in doctors office.  he checks his blood pressure  after taking his medication.  Current home BP readings:   DATE:             BP               PULSE 06/19/22  159/62  68   11:00   patient took BP while on call.    Wrist or arm cuff: arm cuff Caffeine intake: 1 cup regular coffee in am  Salt intake:limits adding to his food OTC medications including pseudoephedrine or NSAIDs?  The patient reports he takes zyrtec in am if he has a runny nose  Any readings above 180/120? No   What recent interventions/DTPs have been made by any provider to improve Blood Pressure control since last CPP Visit:  Patient has not taken his blood pressure at home, counseled to take more often and document readings   Any recent hospitalizations or ED visits since last visit with CPP? No  What diet changes have been made to improve Blood Pressure Control?    Patient reports he watches sodium content, limits processed food  What exercise is being done to improve your Blood Pressure Control? Patient tries to get in 30 minutes exercise daily, cold weather limits activity and the patient reports some shortness of breath and will rest for several minutes then continue.Patient reports he is feeling well today   Adherence Review: Is the patient currently on ACE/ARB medication? No Does the patient have >5 day gap between last estimated fill dates? CPP to review  Star Rating Drugs:  Medication:  Last Fill: Day Supply Glipizide 107m 05/16/22 90 Lantus   04/23/22 90 Metformin 10030m9/20/23 90 Rosuvastatin 4073m [facility]   02/10/22 90   Care Gaps: Annual wellness visit in last year? Yes Most Recent BP  reading:130/60  05/28/22  If Diabetic: Most recent A1C reading:7.7 Last eye exam / retinopathy screening:UTD Last diabetic foot exam:2020  Summary of recommendations from last CCM Pharmacy visit (Date:03/13/22) Summary: CCM F/U visit -DM control improving - A1c 7.8% (01/30/22 per endocrine); pt affirms compliance with regimen -BP elevated in recent OV (160/68, 155/68), pt is not checking  at home   Recommendations/Changes made from today's visit: -No med changes; recommend to monitor BP at home and inform provider if consistently > 150/90  CCM appointment on 09/12/22  Charlene Brooke, CPP notified  Avel Sensor, [place]  [phone]

## 2022-06-08 DIAGNOSIS — D2262 Melanocytic nevi of left upper limb, including shoulder: Secondary | ICD-10-CM | POA: Diagnosis not present

## 2022-06-08 DIAGNOSIS — D485 Neoplasm of uncertain behavior of skin: Secondary | ICD-10-CM | POA: Diagnosis not present

## 2022-06-08 DIAGNOSIS — D225 Melanocytic nevi of trunk: Secondary | ICD-10-CM | POA: Diagnosis not present

## 2022-06-08 DIAGNOSIS — D0439 Carcinoma in situ of skin of other parts of face: Secondary | ICD-10-CM | POA: Diagnosis not present

## 2022-06-08 DIAGNOSIS — Z85828 Personal history of other malignant neoplasm of skin: Secondary | ICD-10-CM | POA: Diagnosis not present

## 2022-06-08 DIAGNOSIS — D2261 Melanocytic nevi of right upper limb, including shoulder: Secondary | ICD-10-CM | POA: Diagnosis not present

## 2022-06-08 DIAGNOSIS — L570 Actinic keratosis: Secondary | ICD-10-CM | POA: Diagnosis not present

## 2022-07-03 ENCOUNTER — Ambulatory Visit (INDEPENDENT_AMBULATORY_CARE_PROVIDER_SITE_OTHER): Payer: Medicare HMO

## 2022-07-03 VITALS — Ht 71.0 in | Wt 253.0 lb

## 2022-07-03 DIAGNOSIS — Z0000 Encounter for general adult medical examination without abnormal findings: Secondary | ICD-10-CM

## 2022-07-03 NOTE — Progress Notes (Signed)
Subjective:   Ryan Garcia is a 89 y.o. male who presents for Medicare Annual/Subsequent preventive examination.  Review of Systems    No ROS.  Medicare Wellness Virtual Visit.  Visual/audio telehealth visit, UTA vital signs.   See social history for additional risk factors.   Cardiac Risk Factors include: advanced age (>76mn, >>71women);male gender     Objective:    Today's Vitals   07/03/22 1038  Weight: 253 lb (114.8 kg)  Height: 5' 11" (1.803 m)   Body mass index is 35.29 kg/m.     07/03/2022   10:45 AM 11/29/2021    6:54 AM 06/29/2021   11:22 AM 04/28/2021    1:48 PM 10/12/2020   12:12 PM 10/12/2020    8:35 AM 08/02/2020    5:09 PM  Advanced Directives  Does Patient Have a Medical Advance Directive? Yes No Yes Yes  No No  Type of [place]  [place]      Does patient want to make changes to medical advance directive? No - Patient declined  Yes (MAU/Ambulatory/Procedural Areas - Information given)      Copy of [place] Chart? Yes - validated most recent copy scanned in chart (See row information)  Yes - validated most recent copy scanned in chart (See row information)      Would patient like information on creating a medical advance directive?  No - Patient declined   Yes (Inpatient - patient requests chaplain consult to create a medical advance directive)  No - Patient declined    Current Medications (verified) Outpatient Encounter Medications as of 07/03/2022  Medication Sig   acetaminophen (TYLENOL) 325 MG tablet Take 325-650 mg by mouth every 6 (six) hours as needed for mild pain or headache.   Alcohol Swabs (B-D SINGLE USE SWABS REGULAR) PADS Use to check blood sugar 2 times a day   apixaban (ELIQUIS) 5 MG TABS tablet Take 1 tablet (5 mg total) by mouth 2 (two) times daily.   Blood Glucose Calibration (TRUE METRIX LEVEL 1) Low SOLN Use to check control on glucose  meter   Blood Glucose Monitoring Suppl (TRUE METRIX AIR GLUCOSE METER) w/Device KIT 1 kit by Other route 2 (two) times daily. Check blood sugar twice daily and as directed. Dx E11.65   cetirizine (ZYRTEC) 10 MG tablet TAKE 1 TABLET EVERY DAY   Coenzyme Q10 (CO Q 10 PO) Take 200 mg by mouth daily.   Cyanocobalamin (VITAMIN B12) 1000 MCG TBCR Take 1,000 mcg by mouth daily.   DROPLET PEN NEEDLES 31G X 6 MM MISC USE ONE TIME DAILY  AT  BEDTIME  WITH  LANTUS   fluticasone (FLONASE) 50 MCG/ACT nasal spray Place 1 spray into both nostrils daily as needed for allergies.    furosemide (LASIX) 20 MG tablet Take 1 tablet (20 mg total) by mouth daily.   glipiZIDE (GLUCOTROL) 10 MG tablet TAKE 1 TABLET TWICE DAILY WITH MEALS   insulin glargine (LANTUS SOLOSTAR) 100 UNIT/ML Solostar Pen Inject 50 Units into the skin at bedtime.   isosorbide mononitrate (IMDUR) 60 MG 24 hr tablet TAKE 1 TABLET EVERY DAY   loratadine (CLARITIN) 10 MG tablet Take 10 mg by mouth daily.   metFORMIN (GLUCOPHAGE) 1000 MG tablet TAKE 1 TABLET TWICE DAILY WITH MEALS   metoprolol tartrate (LOPRESSOR) 25 MG tablet Take 0.5 tablets (12.5 mg total) by mouth 2 (two) times daily.   nitroGLYCERIN (NITROSTAT)  0.4 MG SL tablet Place 1 tablet (0.4 mg total) under the tongue every 5 (five) minutes as needed for chest pain.   NOVOLOG FLEXPEN 100 UNIT/ML FlexPen Inject 16 Units into the skin. Before dinner   rosuvastatin (CRESTOR) 40 MG tablet Take 1 tablet (40 mg total) by mouth daily.   TRUE METRIX BLOOD GLUCOSE TEST test strip CHECK BLOOD SUGAR TWICE DAILY   TRUEplus Lancets 30G MISC Check blood sugar twice daily and as directed. Dx E11.65   No facility-administered encounter medications on file as of 07/03/2022.    Allergies (verified) Loratadine and Simvastatin   History: Past Medical History:  Diagnosis Date   AC (acromioclavicular) joint bone spurs    lt ankle   Allergy    allergic rhinitis   Anginal pain (HCC)    Arthritis     OA   BPH (benign prostatic hyperplasia)    microwave tx of prostate   CAD (coronary artery disease)    a. s/p cath in 2012 showing 70-75% LAD stenosis, 75% D1 stenosis, 75% OM1, 60-70% RCA stenosis, and 80% PDA --> medical therapy pursued b. similar findings by cath in 2016; 06/2017 DES to distal RCA   Chronic kidney disease    Chronic upper back pain    Diabetes mellitus    type II x8 years   GERD (gastroesophageal reflux disease)    HLD (hyperlipidemia)    10/12: TC 208, TG 213, HDL 36, LDL 129   Hx of skin cancer, basal cell    many skin cancers removed/under constant treatment.   Hypertension    NSTEMI (non-ST elevated myocardial infarction) ([place]) 06/18/2017   DES to distal RCA   Obstructive sleep apnea on CPAP    Paroxysmal atrial flutter ([place])    Renal mass    Scoliosis    Past Surgical History:  Procedure Laterality Date   BREAST SURGERY  2009   breast lump removed benign   BUBBLE STUDY  10/14/2020   Procedure: BUBBLE STUDY;  Surgeon: [name], MD;  Location: [facility];  Service: Cardiovascular;;   CARDIAC CATHETERIZATION N/A 03/15/2015   Procedure: Left Heart Cath and Coronary Angiography;  Surgeon: Belva Crome, MD;  Location: [facility];  Service: Cardiovascular;  Laterality: N/A;   CARDIOVERSION N/A 10/14/2020   Procedure: CARDIOVERSION;  Surgeon: [name], MD;  Location: [facility];  Service: Cardiovascular;  Laterality: N/A;   CATARACT EXTRACTION W/PHACO Right 11/29/2021   Procedure: CATARACT EXTRACTION PHACO AND INTRAOCULAR LENS PLACEMENT ([place]) RIGHT DIABETIC;  Surgeon: Leandrew Koyanagi, MD;  Location: [facility];  Service: Ophthalmology;  Laterality: Right;  Diabetic 18.72 01:54.3   CORONARY STENT INTERVENTION N/A 06/18/2017   Procedure: CORONARY STENT INTERVENTION;  Surgeon: Jettie Booze, MD;  Location: [place] [facility];  Service: Cardiovascular;  Laterality: N/A;  RCA   LEFT HEART CATH AND CORONARY ANGIOGRAPHY  N/A 06/18/2017   Procedure: LEFT HEART CATH AND CORONARY ANGIOGRAPHY;  Surgeon: Jettie Booze, MD;  Location: [place] [facility];  Service: Cardiovascular;  Laterality: N/A;   LEFT HEART CATH AND CORONARY ANGIOGRAPHY N/A 04/12/2020   Procedure: LEFT HEART CATH AND CORONARY ANGIOGRAPHY;  Surgeon: Martinique, Peter M, MD;  Location: [place] [facility];  Service: Cardiovascular;  Laterality: N/A;   TEE WITHOUT CARDIOVERSION N/A 10/14/2020   Procedure: TRANSESOPHAGEAL ECHOCARDIOGRAM (TEE);  Surgeon: [name], MD;  Location: [facility];  Service: Cardiovascular;  Laterality: N/A;   ULTRASOUND GUIDANCE FOR VASCULAR ACCESS  06/18/2017  Procedure: Ultrasound Guidance For Vascular Access;  Surgeon: Jettie Booze, MD;  Location: [place] [facility];  Service: Cardiovascular;;  right radial, right femoral   Family History  Problem Relation Age of Onset   Heart attack Brother    Colon cancer Neg Hx    Colon polyps Neg Hx    Stomach cancer Neg Hx    Esophageal cancer Neg Hx    Social History   Socioeconomic History   Marital status: Married    Spouse name: Treyce Spillers   Number of children: 3   Years of education: Not on file   Highest education level: Not on file  Occupational History   Not on file  Tobacco Use   Smoking status: Never    Passive exposure: Never   Smokeless tobacco: Never  Vaping Use   Vaping Use: Never used  Substance and Sexual Activity   Alcohol use: No    Alcohol/week: 0.0 standard drinks of alcohol   Drug use: No   Sexual activity: Not Currently  Other Topics Concern   Not on file  Social History Narrative   Married    Physically active hard of hearing   Social Determinants of Health   Financial Resource Strain: Low Risk  (06/29/2021)   Overall Financial Resource Strain (CARDIA)    Difficulty of Paying Living Expenses: Not hard at all  Food Insecurity: No Food Insecurity (06/29/2021)   Hunger Vital Sign    Worried About Running Out of  Food in the Last Year: Never true    [place] in the Last Year: Never true  Transportation Needs: No Transportation Needs (06/29/2021)   PRAPARE - Hydrologist (Medical): No    Lack of Transportation (Non-Medical): No  Physical Activity: Inactive (06/29/2021)   Exercise Vital Sign    Days of Exercise per Week: 0 days    Minutes of Exercise per Session: 0 min  Stress: No Stress Concern Present (06/29/2021)   [place]    Feeling of Stress : Not at all  Social Connections: Moderately Integrated (06/29/2021)   Social Connection and Isolation Panel [NHANES]    Frequency of Communication with Friends and Family: More than three times a week    Frequency of Social Gatherings with Friends and Family: More than three times a week    Attends Religious Services: More than 4 times per year    Active Member of [employer] or Organizations: No    Attends Archivist Meetings: Never    Marital Status: Married    Tobacco Counseling Counseling given: Not Answered   Clinical Intake:  Pre-visit preparation completed: Yes        Diabetes: Yes (Followed by Endocrinology)  How often do you need to have someone help you when you read instructions, pamphlets, or other written materials from your doctor or pharmacy?: 1 - Never    Interpreter Needed?: No    Activities of Daily Living    07/03/2022   10:39 AM 11/29/2021    6:47 AM  In your present state of health, do you have any difficulty performing the following activities:  Hearing? 1 0  Comment Hearing aids   Vision? 0 0  Difficulty concentrating or making decisions? 0 0  Walking or climbing stairs? 0 0  Dressing or bathing? 0 0  Doing errands, shopping? 0   Preparing Food and eating ? N   Using the Toilet?  N   In the past six months, have you accidently leaked urine? N   Do you have problems with loss of bowel control? N    Managing your Medications? N   Managing your Finances? Y   Comment Wife manages   Housekeeping or managing your Housekeeping? N     Patient Care Team: Tower, Wynelle Fanny, MD as PCP - General Stanford Breed, Denice Bors, MD as PCP - Cardiology (Cardiology) Leandrew Koyanagi, MD as Referring Physician (Ophthalmology) Stanford Breed Denice Bors, MD as Consulting Physician (Cardiology) Dasher, Rayvon Char, MD as Consulting Physician (Dermatology) Salomon Fick., MD as Referring Physician (Dentistry) Charlton Haws, [facility] as Pharmacist (Pharmacist)  Indicate any recent Medical Services you may have received from other than [facility] providers in the past year (date may be approximate).     Assessment:   This is a routine wellness examination for [place].  I connected with  Hinton Rao on 07/03/22 by a audio enabled telemedicine application and verified that I am speaking with the correct person using two identifiers.  Patient Location: Home  Provider Location: Office/Clinic  I discussed the limitations of evaluation and management by telemedicine. The patient expressed understanding and agreed to proceed.   Hearing/Vision screen Hearing Screening - Comments:: Hearing aids Vision Screening - Comments:: Last exam 2023, Dr. Wallace Going Wears glasses Cataract extracted, bilateral  Dietary issues and exercise activities discussed: Current Exercise Habits: The patient does not participate in regular exercise at present, Intensity: Mild   Goals Addressed               This Visit's Progress     Patient Stated     HEMOGLOBIN A1C < 7 (pt-stated)         Depression Screen    07/03/2022   10:39 AM 06/29/2021   11:29 AM 01/18/2020    9:33 AM 01/16/2019    9:10 AM 10/15/2017   12:26 PM 09/07/2016    9:52 AM 08/26/2015   10:35 AM  PHQ 2/9 Scores  PHQ - 2 Score 0 0 0 0 0 0 0  PHQ- 9 Score     0      Fall Risk    07/03/2022   10:38 AM 06/29/2021   11:25 AM 01/18/2020    9:33 AM 01/16/2019    9:10  AM 10/15/2017   12:26 PM  Fall Risk   Falls in the past year? 1 1 0 1 No  Number falls in past yr: 0 0  0   Injury with Fall? 0 1  0   Comment  recieved staples, patient hit head     Risk for fall due to :  Other (Comment)     Risk for fall due to: Comment  took a missed step     Follow up Falls evaluation completed;Falls prevention discussed Falls prevention discussed Falls evaluation completed      FALL RISK PREVENTION PERTAINING TO THE HOME: Home free of loose throw rugs in walkways, pet beds, electrical cords, etc? Yes  Adequate lighting in your home to reduce risk of falls? Yes   ASSISTIVE DEVICES UTILIZED TO PREVENT FALLS: Life alert? No  Use of a cane, walker or w/c? No  Grab bars in the bathroom? Yes  Shower chair or bench in shower? No  Elevated toilet seat or a handicapped toilet? No  Chair lift? Yes  TIMED UP AND GO: Was the test performed? No .   Cognitive Function:    10/15/2017   12:27  PM 09/07/2016    9:52 AM  MMSE - Mini Mental State Exam  Orientation to time 5 5  Orientation to Place 5 5  Registration 3 3  Attention/ Calculation 0 0  Recall 3 3  Language- name 2 objects 0 0  Language- repeat 1 1  Language- follow 3 step command 3 3  Language- read & follow direction 0 0  Write a sentence 0 0  Copy design 0 0  Total score 20 20        07/03/2022   10:43 AM  6CIT Screen  What Year? 0 points  What month? 0 points  What time? 0 points  Count back from 20 0 points  Months in reverse 0 points    Immunizations Immunization History  Administered Date(s) Administered   Fluad Quad(high Dose 65+) 06/23/2019, 04/20/2020, 06/15/2022   Influenza Split 07/09/2011   Influenza Whole 06/01/2002, 05/22/2007   Influenza, Seasonal, Injecte, Preservative Fre 07/23/2012   Influenza,inj,Quad PF,6+ Mos 05/29/2013, 03/01/2014, 08/26/2015, 05/29/2016, 04/29/2017, 05/01/2018   PFIZER(Purple Top)SARS-COV-2 Vaccination 09/14/2019, 10/05/2019, 06/03/2020   Pneumococcal  Conjugate-13 10/04/2014   Pneumococcal Polysaccharide-23 06/08/2002, 05/22/2007   Td 01/22/2001, 08/16/2020   Tdap 07/09/2011   Zoster, Live 11/21/2012   Shingrix Completed?: No.    Education has been provided regarding the importance of this vaccine. Patient has been advised to call insurance company to determine out of pocket expense if they have not yet received this vaccine. Advised may also receive vaccine at local pharmacy or Health Dept. Verbalized acceptance and understanding.  Screening Tests Health Maintenance  Topic Date Due   FOOT EXAM  01/16/2020   COVID-19 Vaccine (4 - 2023-24 season) 07/19/2022 (Originally 03/02/2022)   Zoster Vaccines- Shingrix (1 of 2) 10/02/2022 (Originally 02/20/1953)   HEMOGLOBIN A1C  09/25/2031 (Originally 04/12/2021)   OPHTHALMOLOGY EXAM  10/06/2022   Medicare Annual Wellness (AWV)  07/04/2023   DTaP/Tdap/Td (4 - Td or Tdap) 08/16/2030   Pneumonia Vaccine 58+ Years old  Completed   INFLUENZA VACCINE  Completed   HPV VACCINES  Aged Out   Health Maintenance Health Maintenance Due  Topic Date Due   FOOT EXAM  01/16/2020   Lung Cancer Screening: (Low Dose CT Chest recommended if Age 44-80 years, 30 pack-year currently smoking OR have quit w/in 15years.) does not qualify.   Hepatitis C Screening: does not qualify.  Vision Screening: Recommended annual ophthalmology exams for early detection of glaucoma and other disorders of the eye.  Dental Screening: Recommended annual dental exams for proper oral hygiene.  Community Resource Referral / Chronic Care Management: CRR required this visit?  No   CCM required this visit?  No      Plan:     I have personally reviewed and noted the following in the patient's chart:   Medical and social history Use of alcohol, tobacco or illicit drugs  Current medications and supplements including opioid prescriptions. Patient is not currently taking opioid prescriptions. Functional ability and  status Nutritional status Physical activity Advanced directives List of other physicians Hospitalizations, surgeries, and ER visits in previous 12 months Vitals Screenings to include cognitive, depression, and falls Referrals and appointments  In addition, I have reviewed and discussed with patient certain preventive protocols, quality metrics, and best practice recommendations. A written personalized care plan for preventive services as well as general preventive health recommendations were provided to patient.     Leta Jungling, LPN   12/30/2456

## 2022-07-03 NOTE — Patient Instructions (Addendum)
Ryan Garcia , Thank you for taking time to come for your Medicare Wellness Visit. I appreciate your ongoing commitment to your health goals. Please review the following plan we discussed and let me know if I can assist you in the future.   These are the goals we discussed:  Goals Addressed               This Visit's Progress     Patient Stated     HEMOGLOBIN A1C < 7 (pt-stated)           This is a list of the screening recommended for you and due dates:  Health Maintenance  Topic Date Due   Complete foot exam   01/16/2020   COVID-19 Vaccine (4 - 2023-24 season) 07/19/2022*   Zoster (Shingles) Vaccine (1 of 2) 10/02/2022*   Hemoglobin A1C  09/25/2031*   Eye exam for diabetics  10/06/2022   Medicare Annual Wellness Visit  07/04/2023   DTaP/Tdap/Td vaccine (4 - Td or Tdap) 08/16/2030   Pneumonia Vaccine  Completed   Flu Shot  Completed   HPV Vaccine  Aged Out  *Topic was postponed. The date shown is not the original due date.    Advanced directives: on file  Conditions/risks identified: none new  Next appointment: Follow up in one year for your annual wellness visit.   Preventive Care 87 Years and Older, Male  Preventive care refers to lifestyle choices and visits with your health care provider that can promote health and wellness. What does preventive care include? A yearly physical exam. This is also called an annual well check. Dental exams once or twice a year. Routine eye exams. Ask your health care provider how often you should have your eyes checked. Personal lifestyle choices, including: Daily care of your teeth and gums. Regular physical activity. Eating a healthy diet. Avoiding tobacco and drug use. Limiting alcohol use. Practicing safe sex. Taking low doses of aspirin every day. Taking vitamin and mineral supplements as recommended by your health care provider. What happens during an annual well check? The services and screenings done by your health  care provider during your annual well check will depend on your age, overall health, lifestyle risk factors, and family history of disease. Counseling  Your health care provider may ask you questions about your: Alcohol use. Tobacco use. Drug use. Emotional well-being. Home and relationship well-being. Sexual activity. Eating habits. History of falls. Memory and ability to understand (cognition). Work and work Statistician. Screening  You may have the following tests or measurements: Height, weight, and BMI. Blood pressure. Lipid and cholesterol levels. These may be checked every 5 years, or more frequently if you are over 54 years old. Skin check. Lung cancer screening. You may have this screening every year starting at age 58 if you have a 30-pack-year history of smoking and currently smoke or have quit within the past 15 years. Fecal occult blood test (FOBT) of the stool. You may have this test every year starting at age 36. Flexible sigmoidoscopy or colonoscopy. You may have a sigmoidoscopy every 5 years or a colonoscopy every 10 years starting at age 94. Prostate cancer screening. Recommendations will vary depending on your family history and other risks. Hepatitis C blood test. Hepatitis B blood test. Sexually transmitted disease (STD) testing. Diabetes screening. This is done by checking your blood sugar (glucose) after you have not eaten for a while (fasting). You may have this done every 1-3 years. Abdominal aortic aneurysm (AAA) screening.  You may need this if you are a current or former smoker. Osteoporosis. You may be screened starting at age 68 if you are at high risk. Talk with your health care provider about your test results, treatment options, and if necessary, the need for more tests. Vaccines  Your health care provider may recommend certain vaccines, such as: Influenza vaccine. This is recommended every year. Tetanus, diphtheria, and acellular pertussis (Tdap, Td)  vaccine. You may need a Td booster every 10 years. Zoster vaccine. You may need this after age 5. Pneumococcal 13-valent conjugate (PCV13) vaccine. One dose is recommended after age 92. Pneumococcal polysaccharide (PPSV23) vaccine. One dose is recommended after age 66. Talk to your health care provider about which screenings and vaccines you need and how often you need them. This information is not intended to replace advice given to you by your health care provider. Make sure you discuss any questions you have with your health care provider. Document Released: 07/15/2015 Document Revised: 03/07/2016 Document Reviewed: 04/19/2015 Elsevier Interactive Patient Education  2017 [place] Prevention in the Home Falls can cause injuries. They can happen to people of all ages. There are many things you can do to make your home safe and to help prevent falls. What can I do on the outside of my home? Regularly fix the edges of walkways and driveways and fix any cracks. Remove anything that might make you trip as you walk through a door, such as a raised step or threshold. Trim any bushes or trees on the path to your home. Use bright outdoor lighting. Clear any walking paths of anything that might make someone trip, such as rocks or tools. Regularly check to see if handrails are loose or broken. Make sure that both sides of any steps have handrails. Any raised decks and porches should have guardrails on the edges. Have any leaves, snow, or ice cleared regularly. Use sand or salt on walking paths during winter. Clean up any spills in your garage right away. This includes oil or grease spills. What can I do in the bathroom? Use night lights. Install grab bars by the toilet and in the tub and shower. Do not use towel bars as grab bars. Use non-skid mats or decals in the tub or shower. If you need to sit down in the shower, use a plastic, non-slip stool. Keep the floor dry. Clean up any  water that spills on the floor as soon as it happens. Remove soap buildup in the tub or shower regularly. Attach bath mats securely with double-sided non-slip rug tape. Do not have throw rugs and other things on the floor that can make you trip. What can I do in the bedroom? Use night lights. Make sure that you have a light by your bed that is easy to reach. Do not use any sheets or blankets that are too big for your bed. They should not hang down onto the floor. Have a firm chair that has side arms. You can use this for support while you get dressed. Do not have throw rugs and other things on the floor that can make you trip. What can I do in the kitchen? Clean up any spills right away. Avoid walking on wet floors. Keep items that you use a lot in easy-to-reach places. If you need to reach something above you, use a strong step stool that has a grab bar. Keep electrical cords out of the way. Do not use floor polish or wax  that makes floors slippery. If you must use wax, use non-skid floor wax. Do not have throw rugs and other things on the floor that can make you trip. What can I do with my stairs? Do not leave any items on the stairs. Make sure that there are handrails on both sides of the stairs and use them. Fix handrails that are broken or loose. Make sure that handrails are as long as the stairways. Check any carpeting to make sure that it is firmly attached to the stairs. Fix any carpet that is loose or worn. Avoid having throw rugs at the top or bottom of the stairs. If you do have throw rugs, attach them to the floor with carpet tape. Make sure that you have a light switch at the top of the stairs and the bottom of the stairs. If you do not have them, ask someone to add them for you. What else can I do to help prevent falls? Wear shoes that: Do not have high heels. Have rubber bottoms. Are comfortable and fit you well. Are closed at the toe. Do not wear sandals. If you use a  stepladder: Make sure that it is fully opened. Do not climb a closed stepladder. Make sure that both sides of the stepladder are locked into place. Ask someone to hold it for you, if possible. Clearly mark and make sure that you can see: Any grab bars or handrails. First and last steps. Where the edge of each step is. Use tools that help you move around (mobility aids) if they are needed. These include: Canes. Walkers. Scooters. Crutches. Turn on the lights when you go into a dark area. Replace any light bulbs as soon as they burn out. Set up your furniture so you have a clear path. Avoid moving your furniture around. If any of your floors are uneven, fix them. If there are any pets around you, be aware of where they are. Review your medicines with your doctor. Some medicines can make you feel dizzy. This can increase your chance of falling. Ask your doctor what other things that you can do to help prevent falls. This information is not intended to replace advice given to you by your health care provider. Make sure you discuss any questions you have with your health care provider. Document Released: 04/14/2009 Document Revised: 11/24/2015 Document Reviewed: 07/23/2014 Elsevier Interactive Patient Education  2017 [employer].

## 2022-07-05 ENCOUNTER — Encounter: Payer: Medicare HMO | Attending: Internal Medicine | Admitting: *Deleted

## 2022-07-05 ENCOUNTER — Encounter: Payer: Self-pay | Admitting: *Deleted

## 2022-07-05 VITALS — BP 142/70 | Ht 71.0 in | Wt 257.5 lb

## 2022-07-05 DIAGNOSIS — E669 Obesity, unspecified: Secondary | ICD-10-CM | POA: Diagnosis not present

## 2022-07-05 DIAGNOSIS — Z794 Long term (current) use of insulin: Secondary | ICD-10-CM | POA: Insufficient documentation

## 2022-07-05 DIAGNOSIS — Z6835 Body mass index (BMI) 35.0-35.9, adult: Secondary | ICD-10-CM | POA: Diagnosis not present

## 2022-07-05 DIAGNOSIS — E1159 Type 2 diabetes mellitus with other circulatory complications: Secondary | ICD-10-CM | POA: Diagnosis not present

## 2022-07-05 DIAGNOSIS — I509 Heart failure, unspecified: Secondary | ICD-10-CM | POA: Diagnosis not present

## 2022-07-05 DIAGNOSIS — E7800 Pure hypercholesterolemia, unspecified: Secondary | ICD-10-CM | POA: Diagnosis not present

## 2022-07-05 NOTE — Patient Instructions (Signed)
Check blood sugars 2 x day before breakfast and before supper every day Bring blood sugar records or glucometer to MD appointments  Exercise:  Walk as tolerated  Eat 3 meals day,   1  snack a day Space meals 4-6 hours apart Include 3 servings of carbohydrates with meals Decrease portion sizes Include 1 serving of protein with each meal  Carry fast acting glucose and a snack at all times Rotate injection sites Use different pen needles for NovoLog and Lantus  Call back if you want to schedule another appointment

## 2022-07-05 NOTE — Progress Notes (Signed)
Diabetes Self-Management Education  Visit Type: First/Initial  Appt. Start Time: 1035 Appt. End Time: 1205  07/05/2022  [name], identified by name and date of birth, is a 89 y.o. male with a diagnosis of Diabetes: Type 2.   ASSESSMENT  Blood pressure (!) 142/70, height '5\' 11"'$  (1.803 m), weight 257 lb 8 oz (116.8 kg). Body mass index is 35.91 kg/m.   Diabetes Self-Management Education - 07/05/22 1336       Visit Information   Visit Type First/Initial      Initial Visit   Diabetes Type Type 2    Date Diagnosed at least 20 years    Are you currently following a meal plan? No    Are you taking your medications as prescribed? Yes      Health Coping   How would you rate your overall health? Fair      Psychosocial Assessment   Patient Belief/Attitude about Diabetes Other (comment)   "cheated"   What is the hardest part about your diabetes right now, causing you the most concern, or is the most worrisome to you about your diabetes?   Making healty food and beverage choices    Self-care barriers None    Self-management support Doctor's office;Family    Other persons present Spouse/SO    Patient Concerns Nutrition/Meal planning;Glycemic Control    Special Needs None    Preferred Learning Style Other (comment)   talking/discussion   Learning Readiness Not Ready    How often do you need to have someone help you when you read instructions, pamphlets, or other written materials from your doctor or pharmacy? 1 - Never    What is the last grade level you completed in school? 12th      Pre-Education Assessment   Patient understands the diabetes disease and treatment process. Needs Review    Patient understands incorporating nutritional management into lifestyle. Needs Review    Patient undertands incorporating physical activity into lifestyle. Needs Instruction    Patient understands using medications safely. Needs Review    Patient understands monitoring blood glucose,  interpreting and using results Needs Review    Patient understands prevention, detection, and treatment of acute complications. Needs Review    Patient understands prevention, detection, and treatment of chronic complications. Needs Review    Patient understands how to develop strategies to address psychosocial issues. Needs Instruction    Patient understands how to develop strategies to promote health/change behavior. Needs Instruction      Complications   Last HgB A1C per patient/outside source 7.7 %   05/28/2022   How often do you check your blood sugar? 1-2 times/day    Fasting Blood glucose range (mg/dL) 70-129   He reports FBG's 97-120's mg/dL   Postprandial Blood glucose range (mg/dL) --   He reports bedtime readings 200-250's mg/dL   Number of hypoglycemic episodes per month 1   He reports 1 reading of 59 mg/dL last week at bedtime   Can you tell when your blood sugar is low? Yes   He reports feeling weak   What do you do if your blood sugar is low? He drank some juice but he doesn't carry any fast acting glucose when he is out.    Have you had a dilated eye exam in the past 12 months? Yes    Have you had a dental exam in the past 12 months? Yes    Are you checking your feet? Yes    How many days  per week are you checking your feet? 7      Dietary Intake   Breakfast 1 1/2 piece of toast with either honey or molasses; eggs and sausage; oatmeal with blueberries; cheerios and milk with cheese and crackers - sometimes fruit in cereal - banana, berries    Lunch cottage cheese and peaches; apple, orange; banana sandwich with mayo and peanut butter; vienna sausage and crackers or sardines or beanie weinees or potted meat    Dinner chicken, pork or fish; potatoes weekly, peas, beans, corn, rice, broccoli, green beans, lettuce, tomatoes    Snack (evening) small candy bar or nuts    Beverage(s) water, milk, unsweetened tea, coffee      Activity / Exercise   Activity / Exercise Type ADL's       Patient Education   Previous Diabetes Education Yes (please comment)   He reports he came to classes years ago - ? 2010   Disease Pathophysiology Explored patient's options for treatment of their diabetes    Healthy Eating Role of diet in the treatment of diabetes and the relationship between the three main macronutrients and blood glucose level;Food label reading, portion sizes and measuring food.;Reviewed blood glucose goals for pre and post meals and how to evaluate the patients' food intake on their blood glucose level.;Meal timing in regards to the patients' current diabetes medication.    Being Active Role of exercise on diabetes management, blood pressure control and cardiac health.    Medications Taught/reviewed insulin/injectables, injection, site rotation, insulin/injectables storage and needle disposal.;Reviewed patients medication for diabetes, action, purpose, timing of dose and side effects.    Monitoring Purpose and frequency of SMBG.;Taught/discussed recording of test results and interpretation of SMBG.;Identified appropriate SMBG and/or A1C goals.    Acute complications Taught prevention, symptoms, and  treatment of hypoglycemia - the 15 rule.    Chronic complications Relationship between chronic complications and blood glucose control    Diabetes Stress and Support Identified and addressed patients feelings and concerns about diabetes      Individualized Goals (developed by patient)   Reducing Risk Other (comment)   improve blood sugars     Outcomes   Expected Outcomes Demonstrated interest in learning but significant barriers to change    Program Status Not Completed         Individualized Plan for Diabetes Self-Management Training:   Learning Objective:  Patient will have a greater understanding of diabetes self-management. Patient education plan is to attend individual and/or group sessions per assessed needs and concerns.   Plan:   Patient Instructions  Check  blood sugars 2 x day before breakfast and before supper every day Bring blood sugar records or glucometer to MD appointments  Exercise:  Walk as tolerated  Eat 3 meals day,   1  snack a day Space meals 4-6 hours apart Include 3 servings of carbohydrates with meals Decrease portion sizes Include 1 serving of protein with each meal  Carry fast acting glucose and a snack at all times Rotate injection sites Use different pen needles for NovoLog and Lantus  Call back if you want to schedule another appointment  Expected Outcomes:  Demonstrated interest in learning but significant barriers to change  Education material provided:  General Meal Planning Guidelines Simple Meal Plan Glucose tablets Symptoms, causes and treatments of Hypoglycemia  If problems or questions, patient to contact team via:   Johny Drilling, RN, [place], [place] [phone]  Future DSME appointment:  PRN

## 2022-07-12 LAB — HM DIABETES EYE EXAM

## 2022-07-13 DIAGNOSIS — H35372 Puckering of macula, left eye: Secondary | ICD-10-CM | POA: Diagnosis not present

## 2022-07-13 DIAGNOSIS — E119 Type 2 diabetes mellitus without complications: Secondary | ICD-10-CM | POA: Diagnosis not present

## 2022-07-13 DIAGNOSIS — Z961 Presence of intraocular lens: Secondary | ICD-10-CM | POA: Diagnosis not present

## 2022-07-13 DIAGNOSIS — H353132 Nonexudative age-related macular degeneration, bilateral, intermediate dry stage: Secondary | ICD-10-CM | POA: Diagnosis not present

## 2022-07-25 DIAGNOSIS — D0439 Carcinoma in situ of skin of other parts of face: Secondary | ICD-10-CM | POA: Diagnosis not present

## 2022-07-25 DIAGNOSIS — D0462 Carcinoma in situ of skin of left upper limb, including shoulder: Secondary | ICD-10-CM | POA: Diagnosis not present

## 2022-07-30 ENCOUNTER — Encounter (HOSPITAL_COMMUNITY): Payer: Self-pay | Admitting: Emergency Medicine

## 2022-07-30 ENCOUNTER — Inpatient Hospital Stay (HOSPITAL_COMMUNITY)
Admission: EM | Admit: 2022-07-30 | Discharge: 2022-08-11 | DRG: 329 | Disposition: A | Discharge status: Home Health | Payer: Medicare HMO | Attending: Family Medicine | Admitting: Family Medicine

## 2022-07-30 ENCOUNTER — Other Ambulatory Visit: Payer: Self-pay

## 2022-07-30 ENCOUNTER — Emergency Department (HOSPITAL_COMMUNITY): Payer: Medicare HMO

## 2022-07-30 DIAGNOSIS — Z66 Do not resuscitate: Secondary | ICD-10-CM | POA: Diagnosis not present

## 2022-07-30 DIAGNOSIS — D125 Benign neoplasm of sigmoid colon: Secondary | ICD-10-CM | POA: Diagnosis present

## 2022-07-30 DIAGNOSIS — J8410 Pulmonary fibrosis, unspecified: Secondary | ICD-10-CM | POA: Diagnosis not present

## 2022-07-30 DIAGNOSIS — R1909 Other intra-abdominal and pelvic swelling, mass and lump: Secondary | ICD-10-CM | POA: Diagnosis not present

## 2022-07-30 DIAGNOSIS — K2980 Duodenitis without bleeding: Secondary | ICD-10-CM

## 2022-07-30 DIAGNOSIS — D509 Iron deficiency anemia, unspecified: Secondary | ICD-10-CM | POA: Diagnosis not present

## 2022-07-30 DIAGNOSIS — D123 Benign neoplasm of transverse colon: Secondary | ICD-10-CM

## 2022-07-30 DIAGNOSIS — R6510 Systemic inflammatory response syndrome (SIRS) of non-infectious origin without acute organ dysfunction: Secondary | ICD-10-CM | POA: Diagnosis not present

## 2022-07-30 DIAGNOSIS — D638 Anemia in other chronic diseases classified elsewhere: Secondary | ICD-10-CM | POA: Diagnosis not present

## 2022-07-30 DIAGNOSIS — K2951 Unspecified chronic gastritis with bleeding: Secondary | ICD-10-CM | POA: Diagnosis not present

## 2022-07-30 DIAGNOSIS — E785 Hyperlipidemia, unspecified: Secondary | ICD-10-CM | POA: Diagnosis present

## 2022-07-30 DIAGNOSIS — D630 Anemia in neoplastic disease: Secondary | ICD-10-CM | POA: Diagnosis present

## 2022-07-30 DIAGNOSIS — C182 Malignant neoplasm of ascending colon: Secondary | ICD-10-CM | POA: Diagnosis present

## 2022-07-30 DIAGNOSIS — N179 Acute kidney failure, unspecified: Secondary | ICD-10-CM | POA: Diagnosis present

## 2022-07-30 DIAGNOSIS — D128 Benign neoplasm of rectum: Secondary | ICD-10-CM

## 2022-07-30 DIAGNOSIS — E860 Dehydration: Secondary | ICD-10-CM | POA: Diagnosis present

## 2022-07-30 DIAGNOSIS — I10 Essential (primary) hypertension: Secondary | ICD-10-CM | POA: Diagnosis not present

## 2022-07-30 DIAGNOSIS — E669 Obesity, unspecified: Secondary | ICD-10-CM | POA: Diagnosis present

## 2022-07-30 DIAGNOSIS — I480 Paroxysmal atrial fibrillation: Secondary | ICD-10-CM | POA: Diagnosis present

## 2022-07-30 DIAGNOSIS — I214 Non-ST elevation (NSTEMI) myocardial infarction: Secondary | ICD-10-CM | POA: Diagnosis not present

## 2022-07-30 DIAGNOSIS — I110 Hypertensive heart disease with heart failure: Secondary | ICD-10-CM | POA: Diagnosis present

## 2022-07-30 DIAGNOSIS — H9190 Unspecified hearing loss, unspecified ear: Secondary | ICD-10-CM | POA: Diagnosis present

## 2022-07-30 DIAGNOSIS — M7989 Other specified soft tissue disorders: Secondary | ICD-10-CM | POA: Diagnosis not present

## 2022-07-30 DIAGNOSIS — Z7901 Long term (current) use of anticoagulants: Secondary | ICD-10-CM

## 2022-07-30 DIAGNOSIS — I4891 Unspecified atrial fibrillation: Secondary | ICD-10-CM | POA: Diagnosis not present

## 2022-07-30 DIAGNOSIS — I700 Atherosclerosis of aorta: Secondary | ICD-10-CM | POA: Diagnosis not present

## 2022-07-30 DIAGNOSIS — K8020 Calculus of gallbladder without cholecystitis without obstruction: Secondary | ICD-10-CM | POA: Diagnosis present

## 2022-07-30 DIAGNOSIS — I21A1 Myocardial infarction type 2: Secondary | ICD-10-CM | POA: Diagnosis present

## 2022-07-30 DIAGNOSIS — E1165 Type 2 diabetes mellitus with hyperglycemia: Secondary | ICD-10-CM | POA: Diagnosis present

## 2022-07-30 DIAGNOSIS — C772 Secondary and unspecified malignant neoplasm of intra-abdominal lymph nodes: Secondary | ICD-10-CM | POA: Diagnosis present

## 2022-07-30 DIAGNOSIS — K6389 Other specified diseases of intestine: Secondary | ICD-10-CM | POA: Diagnosis not present

## 2022-07-30 DIAGNOSIS — K648 Other hemorrhoids: Secondary | ICD-10-CM | POA: Diagnosis not present

## 2022-07-30 DIAGNOSIS — Z8249 Family history of ischemic heart disease and other diseases of the circulatory system: Secondary | ICD-10-CM

## 2022-07-30 DIAGNOSIS — K3189 Other diseases of stomach and duodenum: Secondary | ICD-10-CM | POA: Diagnosis present

## 2022-07-30 DIAGNOSIS — E119 Type 2 diabetes mellitus without complications: Secondary | ICD-10-CM | POA: Diagnosis not present

## 2022-07-30 DIAGNOSIS — J90 Pleural effusion, not elsewhere classified: Secondary | ICD-10-CM | POA: Diagnosis not present

## 2022-07-30 DIAGNOSIS — Z794 Long term (current) use of insulin: Secondary | ICD-10-CM | POA: Diagnosis not present

## 2022-07-30 DIAGNOSIS — K5730 Diverticulosis of large intestine without perforation or abscess without bleeding: Secondary | ICD-10-CM | POA: Diagnosis not present

## 2022-07-30 DIAGNOSIS — I5033 Acute on chronic diastolic (congestive) heart failure: Secondary | ICD-10-CM | POA: Diagnosis not present

## 2022-07-30 DIAGNOSIS — C641 Malignant neoplasm of right kidney, except renal pelvis: Secondary | ICD-10-CM | POA: Diagnosis present

## 2022-07-30 DIAGNOSIS — K449 Diaphragmatic hernia without obstruction or gangrene: Secondary | ICD-10-CM | POA: Diagnosis not present

## 2022-07-30 DIAGNOSIS — Z7189 Other specified counseling: Secondary | ICD-10-CM | POA: Diagnosis not present

## 2022-07-30 DIAGNOSIS — Z6833 Body mass index (BMI) 33.0-33.9, adult: Secondary | ICD-10-CM

## 2022-07-30 DIAGNOSIS — I252 Old myocardial infarction: Secondary | ICD-10-CM

## 2022-07-30 DIAGNOSIS — K2950 Unspecified chronic gastritis without bleeding: Secondary | ICD-10-CM | POA: Diagnosis not present

## 2022-07-30 DIAGNOSIS — J984 Other disorders of lung: Secondary | ICD-10-CM | POA: Diagnosis not present

## 2022-07-30 DIAGNOSIS — R0789 Other chest pain: Secondary | ICD-10-CM | POA: Diagnosis not present

## 2022-07-30 DIAGNOSIS — Z955 Presence of coronary angioplasty implant and graft: Secondary | ICD-10-CM

## 2022-07-30 DIAGNOSIS — N2889 Other specified disorders of kidney and ureter: Secondary | ICD-10-CM | POA: Diagnosis not present

## 2022-07-30 DIAGNOSIS — D649 Anemia, unspecified: Secondary | ICD-10-CM | POA: Diagnosis not present

## 2022-07-30 DIAGNOSIS — K635 Polyp of colon: Secondary | ICD-10-CM | POA: Diagnosis not present

## 2022-07-30 DIAGNOSIS — I25110 Atherosclerotic heart disease of native coronary artery with unstable angina pectoris: Secondary | ICD-10-CM | POA: Diagnosis present

## 2022-07-30 DIAGNOSIS — J811 Chronic pulmonary edema: Secondary | ICD-10-CM | POA: Diagnosis not present

## 2022-07-30 DIAGNOSIS — Z8719 Personal history of other diseases of the digestive system: Secondary | ICD-10-CM

## 2022-07-30 DIAGNOSIS — G4733 Obstructive sleep apnea (adult) (pediatric): Secondary | ICD-10-CM | POA: Diagnosis present

## 2022-07-30 DIAGNOSIS — R195 Other fecal abnormalities: Secondary | ICD-10-CM | POA: Diagnosis not present

## 2022-07-30 DIAGNOSIS — R109 Unspecified abdominal pain: Secondary | ICD-10-CM | POA: Diagnosis not present

## 2022-07-30 DIAGNOSIS — D490 Neoplasm of unspecified behavior of digestive system: Secondary | ICD-10-CM | POA: Diagnosis not present

## 2022-07-30 DIAGNOSIS — R079 Chest pain, unspecified: Secondary | ICD-10-CM | POA: Diagnosis present

## 2022-07-30 DIAGNOSIS — D500 Iron deficiency anemia secondary to blood loss (chronic): Secondary | ICD-10-CM | POA: Diagnosis not present

## 2022-07-30 DIAGNOSIS — C188 Malignant neoplasm of overlapping sites of colon: Secondary | ICD-10-CM | POA: Diagnosis not present

## 2022-07-30 DIAGNOSIS — Z888 Allergy status to other drugs, medicaments and biological substances status: Secondary | ICD-10-CM

## 2022-07-30 DIAGNOSIS — I2510 Atherosclerotic heart disease of native coronary artery without angina pectoris: Secondary | ICD-10-CM | POA: Diagnosis not present

## 2022-07-30 DIAGNOSIS — R739 Hyperglycemia, unspecified: Secondary | ICD-10-CM | POA: Diagnosis not present

## 2022-07-30 DIAGNOSIS — E039 Hypothyroidism, unspecified: Secondary | ICD-10-CM | POA: Diagnosis not present

## 2022-07-30 DIAGNOSIS — Z515 Encounter for palliative care: Secondary | ICD-10-CM | POA: Diagnosis not present

## 2022-07-30 DIAGNOSIS — R609 Edema, unspecified: Secondary | ICD-10-CM | POA: Diagnosis not present

## 2022-07-30 DIAGNOSIS — Z7984 Long term (current) use of oral hypoglycemic drugs: Secondary | ICD-10-CM

## 2022-07-30 DIAGNOSIS — D175 Benign lipomatous neoplasm of intra-abdominal organs: Secondary | ICD-10-CM | POA: Diagnosis not present

## 2022-07-30 DIAGNOSIS — Z85828 Personal history of other malignant neoplasm of skin: Secondary | ICD-10-CM

## 2022-07-30 DIAGNOSIS — Z79899 Other long term (current) drug therapy: Secondary | ICD-10-CM

## 2022-07-30 DIAGNOSIS — N281 Cyst of kidney, acquired: Secondary | ICD-10-CM | POA: Diagnosis not present

## 2022-07-30 DIAGNOSIS — R531 Weakness: Secondary | ICD-10-CM | POA: Diagnosis not present

## 2022-07-30 DIAGNOSIS — I440 Atrioventricular block, first degree: Secondary | ICD-10-CM | POA: Diagnosis present

## 2022-07-30 DIAGNOSIS — C189 Malignant neoplasm of colon, unspecified: Secondary | ICD-10-CM | POA: Diagnosis not present

## 2022-07-30 LAB — CBC WITH DIFFERENTIAL/PLATELET
Abs Immature Granulocytes: 0.02 10*3/uL (ref 0.00–0.07)
Basophils Absolute: 0 10*3/uL (ref 0.0–0.1)
Basophils Relative: 0 %
Eosinophils Absolute: 0.1 10*3/uL (ref 0.0–0.5)
Eosinophils Relative: 2 %
HCT: 29.6 % — ABNORMAL LOW (ref 39.0–52.0)
Hemoglobin: 8.7 g/dL — ABNORMAL LOW (ref 13.0–17.0)
Immature Granulocytes: 0 %
Lymphocytes Relative: 12 %
Lymphs Abs: 0.8 10*3/uL (ref 0.7–4.0)
MCH: 24.9 pg — ABNORMAL LOW (ref 26.0–34.0)
MCHC: 29.4 g/dL — ABNORMAL LOW (ref 30.0–36.0)
MCV: 84.6 fL (ref 80.0–100.0)
Monocytes Absolute: 0.7 10*3/uL (ref 0.1–1.0)
Monocytes Relative: 10 %
Neutro Abs: 5.2 10*3/uL (ref 1.7–7.7)
Neutrophils Relative %: 76 %
Platelets: 205 10*3/uL (ref 150–400)
RBC: 3.5 MIL/uL — ABNORMAL LOW (ref 4.22–5.81)
RDW: 15.8 % — ABNORMAL HIGH (ref 11.5–15.5)
WBC: 6.8 10*3/uL (ref 4.0–10.5)
nRBC: 0 % (ref 0.0–0.2)

## 2022-07-30 LAB — BASIC METABOLIC PANEL
Anion gap: 9 (ref 5–15)
BUN: 23 mg/dL (ref 8–23)
CO2: 23 mmol/L (ref 22–32)
Calcium: 8.4 mg/dL — ABNORMAL LOW (ref 8.9–10.3)
Chloride: 107 mmol/L (ref 98–111)
Creatinine, Ser: 1.49 mg/dL — ABNORMAL HIGH (ref 0.61–1.24)
GFR, Estimated: 45 mL/min — ABNORMAL LOW (ref >60)
Glucose, Bld: 321 mg/dL — ABNORMAL HIGH (ref 70–99)
Potassium: 4.5 mmol/L (ref 3.5–5.1)
Sodium: 139 mmol/L (ref 135–145)

## 2022-07-30 LAB — TROPONIN I (HIGH SENSITIVITY)
Troponin I (High Sensitivity): 93 ng/L — ABNORMAL HIGH (ref <18)
Troponin I (High Sensitivity): 425 ng/L (ref <18)

## 2022-07-30 NOTE — ED Notes (Signed)
Patient's elevated Troponin result reported to PA at triage .

## 2022-07-30 NOTE — ED Provider Triage Note (Addendum)
Emergency Medicine Provider Triage Evaluation Note  Ryan Garcia , a 89 y.o. male  was evaluated in triage.  Pt complains of an episode of chest pain earlier today started around 1 PM.  Patient was were, washing machine and exerting himself, sat down to eat and noticed a tightness in the center of his chest.  Described as nonradiating and dull.  Tried 2 nitroglycerin at home with some improvement, EMS arrived on scene and provided 324 mg ASA.  Patient now states symptoms have completely resolved.  Denies shortness of breath, dizziness, nausea, vomiting, diarrhea, abdominal pain.  Hx of cardiac stent placement.  Review of Systems  Positive: See above Negative:   Physical Exam  BP (!) 158/67 (BP Location: Right Arm)   Pulse 71   Temp 98.5 F (36.9 C) (Oral)   Resp 18   SpO2 100%  Gen:   Awake, no distress   Resp:  Normal effort  MSK:   Moves extremities without difficulty  Other:  Sitting comfortably.  HR varies between 45 to 75 bpm.  Chest non-TTP.  Not diaphoretic.  Medical Decision Making  Medically screening exam initiated at 4:55 PM.  Appropriate orders placed.  [name] was informed that the remainder of the evaluation will be completed by another provider, this initial triage assessment does not replace that evaluation, and the importance of remaining in the ED until their evaluation is complete.     [name], PA-C 72/53/66 [id number]   Update: Second troponin of 425.  Called and discussed patient case with charge nurse, plan to bring back to next available room.   [name], PA-C 44/03/47 [id number]

## 2022-07-30 NOTE — ED Triage Notes (Signed)
Pt from home via [facility]. Pt reports chest pain that is worse when walking. Pt reports the pain as dull. Pt has taken 324 ASA and 2 nitro with relief.

## 2022-07-30 NOTE — ED Notes (Signed)
PA at triage speaking with patient and family at this time .

## 2022-07-31 ENCOUNTER — Emergency Department (HOSPITAL_COMMUNITY): Payer: Medicare HMO

## 2022-07-31 ENCOUNTER — Inpatient Hospital Stay (HOSPITAL_COMMUNITY): Payer: Medicare HMO

## 2022-07-31 DIAGNOSIS — J984 Other disorders of lung: Secondary | ICD-10-CM | POA: Diagnosis not present

## 2022-07-31 DIAGNOSIS — Z66 Do not resuscitate: Secondary | ICD-10-CM | POA: Diagnosis not present

## 2022-07-31 DIAGNOSIS — I252 Old myocardial infarction: Secondary | ICD-10-CM | POA: Diagnosis not present

## 2022-07-31 DIAGNOSIS — R079 Chest pain, unspecified: Secondary | ICD-10-CM | POA: Diagnosis present

## 2022-07-31 DIAGNOSIS — I700 Atherosclerosis of aorta: Secondary | ICD-10-CM | POA: Diagnosis not present

## 2022-07-31 DIAGNOSIS — D649 Anemia, unspecified: Secondary | ICD-10-CM | POA: Diagnosis not present

## 2022-07-31 DIAGNOSIS — C182 Malignant neoplasm of ascending colon: Secondary | ICD-10-CM | POA: Diagnosis present

## 2022-07-31 DIAGNOSIS — R609 Edema, unspecified: Secondary | ICD-10-CM | POA: Diagnosis not present

## 2022-07-31 DIAGNOSIS — I25110 Atherosclerotic heart disease of native coronary artery with unstable angina pectoris: Secondary | ICD-10-CM | POA: Diagnosis present

## 2022-07-31 DIAGNOSIS — E039 Hypothyroidism, unspecified: Secondary | ICD-10-CM | POA: Diagnosis not present

## 2022-07-31 DIAGNOSIS — I10 Essential (primary) hypertension: Secondary | ICD-10-CM | POA: Diagnosis not present

## 2022-07-31 DIAGNOSIS — C641 Malignant neoplasm of right kidney, except renal pelvis: Secondary | ICD-10-CM | POA: Diagnosis present

## 2022-07-31 DIAGNOSIS — K449 Diaphragmatic hernia without obstruction or gangrene: Secondary | ICD-10-CM | POA: Diagnosis not present

## 2022-07-31 DIAGNOSIS — E1165 Type 2 diabetes mellitus with hyperglycemia: Secondary | ICD-10-CM | POA: Diagnosis present

## 2022-07-31 DIAGNOSIS — K635 Polyp of colon: Secondary | ICD-10-CM | POA: Diagnosis not present

## 2022-07-31 DIAGNOSIS — I214 Non-ST elevation (NSTEMI) myocardial infarction: Secondary | ICD-10-CM

## 2022-07-31 DIAGNOSIS — D490 Neoplasm of unspecified behavior of digestive system: Secondary | ICD-10-CM | POA: Diagnosis not present

## 2022-07-31 DIAGNOSIS — I2510 Atherosclerotic heart disease of native coronary artery without angina pectoris: Secondary | ICD-10-CM | POA: Diagnosis not present

## 2022-07-31 DIAGNOSIS — R531 Weakness: Secondary | ICD-10-CM | POA: Diagnosis not present

## 2022-07-31 DIAGNOSIS — D500 Iron deficiency anemia secondary to blood loss (chronic): Secondary | ICD-10-CM | POA: Diagnosis not present

## 2022-07-31 DIAGNOSIS — C772 Secondary and unspecified malignant neoplasm of intra-abdominal lymph nodes: Secondary | ICD-10-CM | POA: Diagnosis present

## 2022-07-31 DIAGNOSIS — Z515 Encounter for palliative care: Secondary | ICD-10-CM

## 2022-07-31 DIAGNOSIS — R6510 Systemic inflammatory response syndrome (SIRS) of non-infectious origin without acute organ dysfunction: Secondary | ICD-10-CM | POA: Diagnosis not present

## 2022-07-31 DIAGNOSIS — D125 Benign neoplasm of sigmoid colon: Secondary | ICD-10-CM | POA: Diagnosis present

## 2022-07-31 DIAGNOSIS — I480 Paroxysmal atrial fibrillation: Secondary | ICD-10-CM | POA: Diagnosis present

## 2022-07-31 DIAGNOSIS — K2951 Unspecified chronic gastritis with bleeding: Secondary | ICD-10-CM | POA: Diagnosis not present

## 2022-07-31 DIAGNOSIS — D509 Iron deficiency anemia, unspecified: Secondary | ICD-10-CM | POA: Diagnosis not present

## 2022-07-31 DIAGNOSIS — K3189 Other diseases of stomach and duodenum: Secondary | ICD-10-CM | POA: Diagnosis present

## 2022-07-31 DIAGNOSIS — N2889 Other specified disorders of kidney and ureter: Secondary | ICD-10-CM | POA: Diagnosis not present

## 2022-07-31 DIAGNOSIS — R109 Unspecified abdominal pain: Secondary | ICD-10-CM | POA: Diagnosis not present

## 2022-07-31 DIAGNOSIS — E669 Obesity, unspecified: Secondary | ICD-10-CM | POA: Diagnosis present

## 2022-07-31 DIAGNOSIS — K2950 Unspecified chronic gastritis without bleeding: Secondary | ICD-10-CM | POA: Diagnosis not present

## 2022-07-31 DIAGNOSIS — K648 Other hemorrhoids: Secondary | ICD-10-CM | POA: Diagnosis not present

## 2022-07-31 DIAGNOSIS — C188 Malignant neoplasm of overlapping sites of colon: Secondary | ICD-10-CM | POA: Diagnosis not present

## 2022-07-31 DIAGNOSIS — Z794 Long term (current) use of insulin: Secondary | ICD-10-CM | POA: Diagnosis not present

## 2022-07-31 DIAGNOSIS — R195 Other fecal abnormalities: Secondary | ICD-10-CM | POA: Diagnosis not present

## 2022-07-31 DIAGNOSIS — Z955 Presence of coronary angioplasty implant and graft: Secondary | ICD-10-CM | POA: Diagnosis not present

## 2022-07-31 DIAGNOSIS — J8410 Pulmonary fibrosis, unspecified: Secondary | ICD-10-CM | POA: Diagnosis not present

## 2022-07-31 DIAGNOSIS — H9190 Unspecified hearing loss, unspecified ear: Secondary | ICD-10-CM | POA: Diagnosis present

## 2022-07-31 DIAGNOSIS — E785 Hyperlipidemia, unspecified: Secondary | ICD-10-CM | POA: Diagnosis present

## 2022-07-31 DIAGNOSIS — D638 Anemia in other chronic diseases classified elsewhere: Secondary | ICD-10-CM | POA: Diagnosis not present

## 2022-07-31 DIAGNOSIS — K8020 Calculus of gallbladder without cholecystitis without obstruction: Secondary | ICD-10-CM | POA: Diagnosis not present

## 2022-07-31 DIAGNOSIS — M7989 Other specified soft tissue disorders: Secondary | ICD-10-CM | POA: Diagnosis not present

## 2022-07-31 DIAGNOSIS — N281 Cyst of kidney, acquired: Secondary | ICD-10-CM | POA: Diagnosis not present

## 2022-07-31 DIAGNOSIS — I21A1 Myocardial infarction type 2: Secondary | ICD-10-CM | POA: Diagnosis present

## 2022-07-31 DIAGNOSIS — D128 Benign neoplasm of rectum: Secondary | ICD-10-CM | POA: Diagnosis present

## 2022-07-31 DIAGNOSIS — J90 Pleural effusion, not elsewhere classified: Secondary | ICD-10-CM | POA: Diagnosis not present

## 2022-07-31 DIAGNOSIS — E119 Type 2 diabetes mellitus without complications: Secondary | ICD-10-CM | POA: Diagnosis not present

## 2022-07-31 DIAGNOSIS — K5730 Diverticulosis of large intestine without perforation or abscess without bleeding: Secondary | ICD-10-CM | POA: Diagnosis not present

## 2022-07-31 DIAGNOSIS — G4733 Obstructive sleep apnea (adult) (pediatric): Secondary | ICD-10-CM | POA: Diagnosis present

## 2022-07-31 DIAGNOSIS — R1909 Other intra-abdominal and pelvic swelling, mass and lump: Secondary | ICD-10-CM | POA: Diagnosis not present

## 2022-07-31 DIAGNOSIS — I110 Hypertensive heart disease with heart failure: Secondary | ICD-10-CM | POA: Diagnosis present

## 2022-07-31 DIAGNOSIS — Z7189 Other specified counseling: Secondary | ICD-10-CM | POA: Diagnosis not present

## 2022-07-31 DIAGNOSIS — E860 Dehydration: Secondary | ICD-10-CM | POA: Diagnosis present

## 2022-07-31 DIAGNOSIS — D123 Benign neoplasm of transverse colon: Secondary | ICD-10-CM | POA: Diagnosis present

## 2022-07-31 DIAGNOSIS — N179 Acute kidney failure, unspecified: Secondary | ICD-10-CM | POA: Diagnosis present

## 2022-07-31 DIAGNOSIS — I5033 Acute on chronic diastolic (congestive) heart failure: Secondary | ICD-10-CM | POA: Diagnosis not present

## 2022-07-31 DIAGNOSIS — D630 Anemia in neoplastic disease: Secondary | ICD-10-CM | POA: Diagnosis present

## 2022-07-31 LAB — ECHOCARDIOGRAM COMPLETE
AR max vel: 2.86 cm2
AV Area VTI: 2.75 cm2
AV Area mean vel: 2.8 cm2
AV Mean grad: 5 mmHg
AV Peak grad: 9.7 mmHg
Ao pk vel: 1.56 m/s
S' Lateral: 3.1 cm

## 2022-07-31 LAB — FOLATE: Folate: 11.8 ng/mL (ref >5.9)

## 2022-07-31 LAB — CBC
HCT: 27 % — ABNORMAL LOW (ref 39.0–52.0)
Hemoglobin: 8.2 g/dL — ABNORMAL LOW (ref 13.0–17.0)
MCH: 25.4 pg — ABNORMAL LOW (ref 26.0–34.0)
MCHC: 30.4 g/dL (ref 30.0–36.0)
MCV: 83.6 fL (ref 80.0–100.0)
Platelets: 178 10*3/uL (ref 150–400)
RBC: 3.23 MIL/uL — ABNORMAL LOW (ref 4.22–5.81)
RDW: 15.8 % — ABNORMAL HIGH (ref 11.5–15.5)
WBC: 6 10*3/uL (ref 4.0–10.5)
nRBC: 0 % (ref 0.0–0.2)

## 2022-07-31 LAB — MAGNESIUM: Magnesium: 2.1 mg/dL (ref 1.7–2.4)

## 2022-07-31 LAB — MRSA NEXT GEN BY PCR, NASAL: MRSA by PCR Next Gen: NOT DETECTED

## 2022-07-31 LAB — HEMOGLOBIN AND HEMATOCRIT, BLOOD
HCT: 28.4 % — ABNORMAL LOW (ref 39.0–52.0)
HCT: 30.2 % — ABNORMAL LOW (ref 39.0–52.0)
Hemoglobin: 8.6 g/dL — ABNORMAL LOW (ref 13.0–17.0)
Hemoglobin: 9.2 g/dL — ABNORMAL LOW (ref 13.0–17.0)

## 2022-07-31 LAB — URINALYSIS, ROUTINE W REFLEX MICROSCOPIC
Bacteria, UA: NONE SEEN
Bilirubin Urine: NEGATIVE
Glucose, UA: >=500 mg/dL — AB
Hgb urine dipstick: NEGATIVE
Ketones, ur: NEGATIVE mg/dL
Leukocytes,Ua: NEGATIVE
Nitrite: NEGATIVE
Protein, ur: 30 mg/dL — AB
Specific Gravity, Urine: 1.012 (ref 1.005–1.030)
pH: 6 (ref 5.0–8.0)

## 2022-07-31 LAB — RETICULOCYTES
Immature Retic Fract: 17.1 % — ABNORMAL HIGH (ref 2.3–15.9)
RBC.: 3.14 MIL/uL — ABNORMAL LOW (ref 4.22–5.81)
Retic Count, Absolute: 54.6 10*3/uL (ref 19.0–186.0)
Retic Ct Pct: 1.7 % (ref 0.4–3.1)

## 2022-07-31 LAB — GLUCOSE, CAPILLARY
Glucose-Capillary: 154 mg/dL — ABNORMAL HIGH (ref 70–99)
Glucose-Capillary: 164 mg/dL — ABNORMAL HIGH (ref 70–99)
Glucose-Capillary: 192 mg/dL — ABNORMAL HIGH (ref 70–99)

## 2022-07-31 LAB — COMPREHENSIVE METABOLIC PANEL
ALT: 16 U/L (ref 0–44)
AST: 23 U/L (ref 15–41)
Albumin: 3.1 g/dL — ABNORMAL LOW (ref 3.5–5.0)
Alkaline Phosphatase: 75 U/L (ref 38–126)
Anion gap: 10 (ref 5–15)
BUN: 22 mg/dL (ref 8–23)
CO2: 24 mmol/L (ref 22–32)
Calcium: 8.7 mg/dL — ABNORMAL LOW (ref 8.9–10.3)
Chloride: 106 mmol/L (ref 98–111)
Creatinine, Ser: 1.14 mg/dL (ref 0.61–1.24)
GFR, Estimated: >60 mL/min (ref >60)
Glucose, Bld: 274 mg/dL — ABNORMAL HIGH (ref 70–99)
Potassium: 4 mmol/L (ref 3.5–5.1)
Sodium: 140 mmol/L (ref 135–145)
Total Bilirubin: 0.4 mg/dL (ref 0.3–1.2)
Total Protein: 5.8 g/dL — ABNORMAL LOW (ref 6.5–8.1)

## 2022-07-31 LAB — LIPID PANEL
Cholesterol: 86 mg/dL (ref 0–200)
HDL: 29 mg/dL — ABNORMAL LOW (ref >40)
LDL Cholesterol: 38 mg/dL (ref 0–99)
Total CHOL/HDL Ratio: 3 RATIO
Triglycerides: 95 mg/dL (ref <150)
VLDL: 19 mg/dL (ref 0–40)

## 2022-07-31 LAB — HEMOGLOBIN A1C
Hgb A1c MFr Bld: 7.8 % — ABNORMAL HIGH (ref 4.8–5.6)
Mean Plasma Glucose: 177.16 mg/dL

## 2022-07-31 LAB — PROTIME-INR
INR: 1.2 (ref 0.8–1.2)
Prothrombin Time: 15.5 seconds — ABNORMAL HIGH (ref 11.4–15.2)

## 2022-07-31 LAB — FERRITIN: Ferritin: 6 ng/mL — ABNORMAL LOW (ref 24–336)

## 2022-07-31 LAB — TYPE AND SCREEN
ABO/RH(D): O NEG
Antibody Screen: NEGATIVE

## 2022-07-31 LAB — LACTIC ACID, PLASMA
Lactic Acid, Venous: 1.4 mmol/L (ref 0.5–1.9)
Lactic Acid, Venous: 1.2 mmol/L (ref 0.5–1.9)

## 2022-07-31 LAB — TROPONIN I (HIGH SENSITIVITY)
Troponin I (High Sensitivity): 651 ng/L (ref <18)
Troponin I (High Sensitivity): 609 ng/L (ref <18)

## 2022-07-31 LAB — PHOSPHORUS: Phosphorus: 3.2 mg/dL (ref 2.5–4.6)

## 2022-07-31 LAB — IRON AND TIBC
Iron: 25 ug/dL — ABNORMAL LOW (ref 45–182)
Saturation Ratios: 6 % — ABNORMAL LOW (ref 17.9–39.5)
TIBC: 447 ug/dL (ref 250–450)
UIBC: 422 ug/dL

## 2022-07-31 LAB — BRAIN NATRIURETIC PEPTIDE: B Natriuretic Peptide: 122.8 pg/mL — ABNORMAL HIGH (ref 0.0–100.0)

## 2022-07-31 LAB — CBG MONITORING, ED
Glucose-Capillary: 217 mg/dL — ABNORMAL HIGH (ref 70–99)
Glucose-Capillary: 198 mg/dL — ABNORMAL HIGH (ref 70–99)
Glucose-Capillary: 261 mg/dL — ABNORMAL HIGH (ref 70–99)

## 2022-07-31 LAB — ABO/RH: ABO/RH(D): O NEG

## 2022-07-31 LAB — VITAMIN B12: Vitamin B-12: 692 pg/mL (ref 180–914)

## 2022-07-31 MED ORDER — METOPROLOL TARTRATE 12.5 MG HALF TABLET
12.5000 mg | ORAL_TABLET | Freq: Two times a day (BID) | ORAL | Status: DC
  Administered 2022-07-31 – 2022-08-11 (×23): 12.5 mg via ORAL
  Filled 2022-07-31 (×24): qty 1

## 2022-07-31 MED ORDER — ROSUVASTATIN CALCIUM 20 MG PO TABS
40.0000 mg | ORAL_TABLET | Freq: Every day | ORAL | Status: DC
  Administered 2022-07-31 – 2022-08-11 (×11): 40 mg via ORAL
  Filled 2022-07-31 (×11): qty 2

## 2022-07-31 MED ORDER — PROCHLORPERAZINE EDISYLATE 10 MG/2ML IJ SOLN: 5.0000 mg | Freq: Four times a day (QID) | INTRAMUSCULAR | Status: DC | PRN

## 2022-07-31 MED ORDER — PEG-KCL-NACL-NASULF-NA ASC-C 100 G PO SOLR: 1.0000 | Freq: Once | ORAL | Status: DC

## 2022-07-31 MED ORDER — PEG-KCL-NACL-NASULF-NA ASC-C 100 G PO SOLR
1.0000 | Freq: Once | ORAL | Status: AC
  Administered 2022-07-31: 200 g via ORAL
  Filled 2022-07-31: qty 1

## 2022-07-31 MED ORDER — PANTOPRAZOLE SODIUM 40 MG PO TBEC
40.0000 mg | DELAYED_RELEASE_TABLET | Freq: Every day | ORAL | Status: DC
  Administered 2022-08-01 – 2022-08-11 (×10): 40 mg via ORAL
  Filled 2022-07-31 (×10): qty 1

## 2022-07-31 MED ORDER — MELATONIN 3 MG PO TABS: 3.0000 mg | ORAL_TABLET | Freq: Every evening | ORAL | Status: DC | PRN

## 2022-07-31 MED ORDER — ISOSORBIDE MONONITRATE ER 30 MG PO TB24
30.0000 mg | ORAL_TABLET | Freq: Every day | ORAL | Status: DC
  Administered 2022-07-31 – 2022-08-11 (×10): 30 mg via ORAL
  Filled 2022-07-31 (×11): qty 1

## 2022-07-31 MED ORDER — ACETAMINOPHEN 325 MG PO TABS
650.0000 mg | ORAL_TABLET | Freq: Four times a day (QID) | ORAL | Status: DC | PRN
  Administered 2022-08-01 – 2022-08-03 (×2): 650 mg via ORAL
  Filled 2022-07-31 (×2): qty 2

## 2022-07-31 MED ORDER — INSULIN ASPART 100 UNIT/ML IJ SOLN
0.0000 [IU] | INTRAMUSCULAR | Status: DC
  Administered 2022-07-31 (×3): 3 [IU] via SUBCUTANEOUS
  Administered 2022-07-31: 8 [IU] via SUBCUTANEOUS
  Administered 2022-07-31: 5 [IU] via SUBCUTANEOUS
  Administered 2022-07-31: 3 [IU] via SUBCUTANEOUS
  Administered 2022-08-01 (×2): 2 [IU] via SUBCUTANEOUS
  Administered 2022-08-01: 5 [IU] via SUBCUTANEOUS
  Administered 2022-08-01: 3 [IU] via SUBCUTANEOUS
  Administered 2022-08-01: 2 [IU] via SUBCUTANEOUS
  Administered 2022-08-02: 8 [IU] via SUBCUTANEOUS
  Administered 2022-08-02 – 2022-08-03 (×6): 3 [IU] via SUBCUTANEOUS
  Administered 2022-08-03: 5 [IU] via SUBCUTANEOUS
  Administered 2022-08-03 (×2): 8 [IU] via SUBCUTANEOUS
  Administered 2022-08-03: 3 [IU] via SUBCUTANEOUS
  Administered 2022-08-04: 5 [IU] via SUBCUTANEOUS
  Administered 2022-08-04: 3 [IU] via SUBCUTANEOUS
  Administered 2022-08-04 (×4): 5 [IU] via SUBCUTANEOUS
  Administered 2022-08-05: 3 [IU] via SUBCUTANEOUS
  Administered 2022-08-05: 5 [IU] via SUBCUTANEOUS
  Administered 2022-08-05: 3 [IU] via SUBCUTANEOUS

## 2022-07-31 MED ORDER — BISACODYL 5 MG PO TBEC
20.0000 mg | DELAYED_RELEASE_TABLET | Freq: Once | ORAL | Status: AC
  Administered 2022-07-31: 20 mg via ORAL
  Filled 2022-07-31: qty 4

## 2022-07-31 MED ORDER — IOHEXOL 350 MG/ML SOLN
80.0000 mL | Freq: Once | INTRAVENOUS | Status: AC | PRN
  Administered 2022-07-31: 80 mL via INTRAVENOUS

## 2022-07-31 MED ORDER — METOCLOPRAMIDE HCL 5 MG/ML IJ SOLN
10.0000 mg | Freq: Four times a day (QID) | INTRAMUSCULAR | Status: AC
  Administered 2022-07-31 – 2022-08-01 (×2): 10 mg via INTRAVENOUS
  Filled 2022-07-31 (×2): qty 2

## 2022-07-31 MED ORDER — PANTOPRAZOLE SODIUM 40 MG IV SOLR
40.0000 mg | Freq: Two times a day (BID) | INTRAVENOUS | Status: DC
  Administered 2022-07-31: 40 mg via INTRAVENOUS
  Filled 2022-07-31: qty 10

## 2022-07-31 NOTE — ED Notes (Signed)
GI at bedside

## 2022-07-31 NOTE — Progress Notes (Signed)
  Echocardiogram 2D Echocardiogram has been performed.  Ryan Garcia 07/31/2022, 8:07 AM

## 2022-07-31 NOTE — ED Notes (Signed)
Dr. Starla Link at bedside

## 2022-07-31 NOTE — Plan of Care (Signed)
  Problem: Education: Goal: Ability to describe self-care measures that may prevent or decrease complications (Diabetes Survival Skills Education) will improve Outcome: Progressing Goal: Individualized Educational Video(s) Outcome: Progressing   Problem: Coping: Goal: Ability to adjust to condition or change in health will improve Outcome: Progressing   Problem: Fluid Volume: Goal: Ability to maintain a balanced intake and output will improve Outcome: Progressing   Problem: Health Behavior/Discharge Planning: Goal: Ability to identify and utilize available resources and services will improve Outcome: Progressing Goal: Ability to manage health-related needs will improve Outcome: Progressing   Problem: Metabolic: Goal: Ability to maintain appropriate glucose levels will improve Outcome: Progressing   Problem: Nutritional: Goal: Maintenance of adequate nutrition will improve Outcome: Progressing Goal: Progress toward achieving an optimal weight will improve Outcome: Progressing   Problem: Skin Integrity: Goal: Risk for impaired skin integrity will decrease Outcome: Progressing   Problem: Tissue Perfusion: Goal: Adequacy of tissue perfusion will improve Outcome: Progressing   Problem: Health Behavior/Discharge Planning: Goal: Ability to manage health-related needs will improve Outcome: Progressing   Problem: Education: Goal: Knowledge of General Education information will improve Description: Including pain rating scale, medication(s)/side effects and non-pharmacologic comfort measures Outcome: Progressing   Problem: Clinical Measurements: Goal: Ability to maintain clinical measurements within normal limits will improve Outcome: Progressing Goal: Will remain free from infection Outcome: Progressing Goal: Diagnostic test results will improve Outcome: Progressing Goal: Respiratory complications will improve Outcome: Progressing Goal: Cardiovascular complication will  be avoided Outcome: Progressing   Problem: Activity: Goal: Risk for activity intolerance will decrease Outcome: Progressing   Problem: Nutrition: Goal: Adequate nutrition will be maintained Outcome: Progressing   Problem: Coping: Goal: Level of anxiety will decrease Outcome: Progressing   Problem: Elimination: Goal: Will not experience complications related to bowel motility Outcome: Progressing Goal: Will not experience complications related to urinary retention Outcome: Progressing   Problem: Pain Managment: Goal: General experience of comfort will improve Outcome: Progressing   Problem: Safety: Goal: Ability to remain free from injury will improve Outcome: Progressing

## 2022-07-31 NOTE — ED Notes (Signed)
Patient having testing done at this time.

## 2022-07-31 NOTE — Consult Note (Signed)
Consultation Note Date: 07/31/2022   Patient Name: Ryan Garcia  DOB: 12-22-1933  MRN: [id number]  Age / Sex: 89 y.o., male  PCP: Tower, Wynelle Fanny, MD Referring Physician: Aline August, MD  Reason for Consultation: Establishing goals of care and Psychosocial/spiritual support  HPI/Patient Profile: 89 y.o. male   admitted on 07/30/2022    with severe CAD, 3 vessels, remote PCI, hypertension, probable right kidney RCC on surveillance, gallstones, A-fib on Eliquis, sleep apnea admitted with symptomatic heme positive IDA.   Remote colon polyps.  Sarah's note summarizes his endoscopic evaluations well.   Today hemoglobin is 8.7 having been normal at 13 a year ago He is iron deficient with a ferritin of 6 and low iron sat   Cross-sectional imaging shows the RCC but no overt bowel pathology.   NSTEMI presentation felt secondary to demand.  Echo shows preserved cardiac function.  No overt valvular abnormalities.   Difficult situation as he needs chronic anticoagulation, has severe CAD but heme positive stool and IDA.  Concerning for chronic GI blood loss.  Any endoscopic evaluation is higher than average risk given his age, cardiac history and recent NSTEMI.  I have discussed this with cardiology and they feel that given everything he is still appropriate for endoscopic evaluation and this will also help make the decision about future anticoagulation.   Thus after discussing this with the patient and his wife at length we will proceed as follows:   Upper and lower endoscopy tomorrow with monitored anesthesia care Hold anticoagulation Give IV iron Twice daily PPI until EGD Monitor CBC I added on a UA to ensure no blood in urine given known RCC as this could be a source of chronic blood loss?        At the time of this visit, the patient was chest pain-free.  No dyspnea while at rest.  No  palpitations.  Patient and his family face treatment option decisions,  advanced directive decisions and anticipatory care needs.    Clinical Assessment and Goals of Care:  This NP [name] reviewed medical records, received report from team, assessed the patient and then meet at the patient's bedside along with his wife    to discuss diagnosis, prognosis, GOC, EOL wishes disposition and options.   Concept of Palliative Care was introduced as specialized medical care for people and their families living with serious illness.  If focuses on providing relief from the symptoms and stress of a serious illness.  The goal is to improve quality of life for both the patient and the family.  Created space and opportunity for patient  and family to explore thoughts and feelings regarding current medical situation     A  discussion was had today regarding advanced directives.  Concepts specific to code status, artifical feeding and hydration, continued IV antibiotics and rehospitalization was had.  The difference between a aggressive medical intervention path  and a palliative comfort care path for this patient at this time was had.  Values and goals of  care important to patient and family were attempted to be elicited.       Natural trajectory and expectations at EOL were discussed.  Questions and concerns addressed.  Patient  encouraged to call with questions or concerns.     PMT will continue to support holistically.           Caprice Renshaw       SUMMARY OF RECOMMENDATIONS    Code Status/Advance Care Planning: DNR    Palliative Prophylaxis:  Bowel Regimen, Delirium Protocol, and Frequent Pain Assessment  Additional Recommendations (Limitations, Scope, Preferences): {Recommended Scope and Preferences:21019}  Psycho-social/Spiritual:  Desire for further Chaplaincy support:no-declined   Prognosis:  Unable to determine  Discharge Planning: To Be Determined      Primary  Diagnoses: Present on Admission:  NSTEMI (non-ST elevated myocardial infarction) ([place])   I have reviewed the medical record, interviewed the patient and family, and examined the patient. The following aspects are pertinent.  Past Medical History:  Diagnosis Date   AC (acromioclavicular) joint bone spurs    lt ankle   Allergy    allergic rhinitis   Anginal pain (HCC)    Arthritis    OA   BPH (benign prostatic hyperplasia)    microwave tx of prostate   CAD (coronary artery disease)    a. s/p cath in 2012 showing 70-75% LAD stenosis, 75% D1 stenosis, 75% OM1, 60-70% RCA stenosis, and 80% PDA --> medical therapy pursued b. similar findings by cath in 2016; 06/2017 DES to distal RCA   Chronic kidney disease    Chronic upper back pain    Diabetes mellitus    type II x8 years   GERD (gastroesophageal reflux disease)    HLD (hyperlipidemia)    10/12: TC 208, TG 213, HDL 36, LDL 129   Hx of skin cancer, basal cell    many skin cancers removed/under constant treatment.   Hypertension    NSTEMI (non-ST elevated myocardial infarction) ([place]) 06/18/2017   DES to distal RCA   Obstructive sleep apnea on CPAP    Paroxysmal atrial flutter (HCC)    Renal mass    Scoliosis    Social History   Socioeconomic History   Marital status: Married    Spouse name: Windle Huebert   Number of children: 3   Years of education: Not on file   Highest education level: Not on file  Occupational History   Not on file  Tobacco Use   Smoking status: Never    Passive exposure: Never   Smokeless tobacco: Never  Vaping Use   Vaping Use: Never used  Substance and Sexual Activity   Alcohol use: No    Alcohol/week: 0.0 standard drinks of alcohol   Drug use: No   Sexual activity: Not Currently  Other Topics Concern   Not on file  Social History Narrative   Married    Physically active hard of hearing   Social Determinants of Health   Financial Resource Strain: Low Risk  (06/29/2021)   Overall  Financial Resource Strain (CARDIA)    Difficulty of Paying Living Expenses: Not hard at all  Food Insecurity: No Food Insecurity (06/29/2021)   Hunger Vital Sign    Worried About Running Out of Food in the Last Year: Never true    Ran Out of Food in the Last Year: Never true  Transportation Needs: No Transportation Needs (06/29/2021)   PRAPARE - Transportation    Lack of Transportation (Medical): No    Lack  of Transportation (Non-Medical): No  Physical Activity: Inactive (06/29/2021)   Exercise Vital Sign    Days of Exercise per Week: 0 days    Minutes of Exercise per Session: 0 min  Stress: No Stress Concern Present (06/29/2021)   [place]    Feeling of Stress : Not at all  Social Connections: Moderately Integrated (06/29/2021)   Social Connection and Isolation Panel [NHANES]    Frequency of Communication with Friends and Family: More than three times a week    Frequency of Social Gatherings with Friends and Family: More than three times a week    Attends Religious Services: More than 4 times per year    Active Member of [employer] or Organizations: No    Attends Music therapist: Never    Marital Status: Married   Family History  Problem Relation Age of Onset   Heart attack Brother    Colon cancer Neg Hx    Colon polyps Neg Hx    Stomach cancer Neg Hx    Esophageal cancer Neg Hx    Scheduled Meds:  insulin aspart  0-15 Units Subcutaneous Q4H   isosorbide mononitrate  30 mg Oral Daily   metoCLOPramide (REGLAN) injection  10 mg Intravenous Q6H   metoprolol tartrate  12.5 mg Oral BID   [START ON 08/01/2022] pantoprazole  40 mg Oral Q0600   peg 3350 powder  1 kit Oral Once   rosuvastatin  40 mg Oral Daily   Continuous Infusions: PRN Meds:.acetaminophen, melatonin, prochlorperazine Medications Prior to Admission:  Prior to Admission medications   Medication Sig Start Date End Date Taking?  Authorizing Provider  acetaminophen (TYLENOL) 325 MG tablet Take 325-650 mg by mouth every 6 (six) hours as needed for mild pain or headache.   Yes [provider]  apixaban (ELIQUIS) 5 MG TABS tablet Take 1 tablet (5 mg total) by mouth 2 (two) times daily. 11/22/21  Yes Lelon Perla, MD  Coenzyme Q10 (CO Q 10 PO) Take 200 mg by mouth daily.   Yes [provider]  Cyanocobalamin (VITAMIN B12) 1000 MCG TBCR Take 1,000 mcg by mouth daily. 04/12/20  Yes Cheryln Manly, NP  fluticasone (FLONASE) 50 MCG/ACT nasal spray Place 1 spray into both nostrils daily as needed for allergies.    Yes [provider]  furosemide (LASIX) 20 MG tablet Take 20 mg by mouth daily. 07/18/22  Yes [provider]  glipiZIDE (GLUCOTROL) 10 MG tablet TAKE 1 TABLET TWICE DAILY WITH MEALS Patient taking differently: Take 10 mg by mouth 2 (two) times daily. 12/12/21  Yes Tower, Wynelle Fanny, MD  insulin glargine (LANTUS SOLOSTAR) 100 UNIT/ML Solostar Pen Inject 50 Units into the skin at bedtime. Patient taking differently: Inject 51 Units into the skin at bedtime. 10/11/21  Yes Tower, Wynelle Fanny, MD  isosorbide mononitrate (IMDUR) 60 MG 24 hr tablet TAKE 1 TABLET EVERY DAY Patient taking differently: Take 60 mg by mouth daily. 02/05/22  Yes Lelon Perla, MD  metFORMIN (GLUCOPHAGE) 1000 MG tablet TAKE 1 TABLET TWICE DAILY WITH MEALS Patient taking differently: Take 1,000 mg by mouth 2 (two) times daily. 10/25/21  Yes Tower, Wynelle Fanny, MD  metoprolol tartrate (LOPRESSOR) 25 MG tablet Take 0.5 tablets (12.5 mg total) by mouth 2 (two) times daily. 12/12/21  Yes Crenshaw, Denice Bors, MD  NOVOLOG FLEXPEN 100 UNIT/ML FlexPen Inject 16 Units into the skin daily. Before dinner 09/14/21  Yes [provider]  rosuvastatin (CRESTOR) 40 MG tablet Take 1 tablet (40 mg total) by mouth daily. 01/11/22 07/31/22 Yes Lelon Perla, MD  Alcohol Swabs (B-D SINGLE USE SWABS REGULAR) PADS Use to check blood sugar  2 times a day 03/30/21   Tower, Wynelle Fanny, MD  Blood Glucose Calibration (TRUE METRIX LEVEL 1) Low SOLN Use to check control on glucose meter 03/30/21   Tower, Wynelle Fanny, MD  Blood Glucose Monitoring Suppl (TRUE METRIX AIR GLUCOSE METER) w/Device KIT 1 kit by Other route 2 (two) times daily. Check blood sugar twice daily and as directed. Dx E11.65 03/30/21   Tower, Wynelle Fanny, MD  DROPLET PEN NEEDLES 31G X 6 MM MISC USE ONE TIME DAILY  AT  BEDTIME  WITH  LANTUS 01/01/22   Tower, Wynelle Fanny, MD  nitroGLYCERIN (NITROSTAT) 0.4 MG SL tablet Place 1 tablet (0.4 mg total) under the tongue every 5 (five) minutes as needed for chest pain. 05/11/19   [facility], Tami Lin, PA  TRUE METRIX BLOOD GLUCOSE TEST test strip CHECK BLOOD SUGAR TWICE DAILY 01/26/22   Tower, Wynelle Fanny, MD  TRUEplus Lancets 30G MISC Check blood sugar twice daily and as directed. Dx E11.65 03/30/21   Abner Greenspan, MD   Allergies  Allergen Reactions   Loratadine Other (See Comments)    Not effective   Simvastatin Other (See Comments)    Intolerant- caused joint pain   Review of Systems  Constitutional:  Positive for fatigue.    Physical Exam Cardiovascular:     Rate and Rhythm: Normal rate.  Skin:    General: Skin is warm and dry.  Neurological:     Mental Status: He is alert and oriented to person, place, and time.     Vital Signs: BP (!) 132/58   Pulse 89   Temp 98.1 F (36.7 C) (Oral)   Resp 18   SpO2 100%  Pain Scale: 0-10   Pain Score: 0-No pain   SpO2: SpO2: 100 % O2 Device:SpO2: 100 % O2 Flow Rate: .   IO: Intake/output summary:  Intake/Output Summary (Last 24 hours) at 07/31/2022 1512 Last data filed at 07/31/2022 0947 Gross per 24 hour  Intake --  Output 900 ml  Net -900 ml    LBM:   Baseline Weight:   Most recent weight:       Palliative Assessment/Data:   Discussed with nursing   Time In: 1300 Time Out: 1415 Time Total: 75 minutes Greater than 50%  of this time was spent counseling and coordinating  care related to the above assessment and plan.  Signed by: [name], NP   Please contact Palliative Medicine Team phone at [phone] for questions and concerns.  For individual provider: See Shea Evans

## 2022-07-31 NOTE — Consult Note (Signed)
[facility] Consult: 8:21 AM 07/31/2022  LOS: 0 days    Referring Provider: Dr Starla Link  Primary Care Physician:  Tower, Wynelle Fanny, MD Primary Gastroenterologist:  Dr. Loletha Carrow.       Reason for Consultation:  Anemia, trace FOBT +.  Angina, STEMI vs NSTEMI.     HPI: Ryan Garcia is a 89 y.o. male.  HX severe 3 V CAD, NSTEMI 2016.  sp stenting 06/2017, angioplasty/stenting 04/2020.  Patient declined CABG in 2021.  CHF D.  Hypertension.  R renal mass, followed by urology.  Fatty liver.  Cholelithiasis.  Afib on eliquis.  OSA on CPAP.  Hearing deficit.  Last week dermatology skin cancer following the 5 bilateral, stitches are still in place.   08/2010 colonoscopy for adenoma surveillance.  Initial adenomas in 2006.  2 polyps (Tubular adenomas) removed from ascending colon.  Scattered ascending tics, moderate sigmoid tics.  Diminutive polyp descending colon (polypoid colorectal mucosa). Positive Cologuard 2018 but Dr. Loletha Carrow felt patient too high risk for colonoscopy.   Elevated LFTs 04/2020, Dr. Lyndel Safe suspected passed CBD stone and treatment for cholangitis.  CT showed sigmoid diverticulitis, uncomplicated cholelithiasis, fatty liver.  MRI/MRCP showed simple cholelithiasis, inflammation/thickening at D2 cw  duodenitis.  Small duodenal diverticulum containing fluid.  No ductal dilatation.  Small hepatic cyst, small hepatic hemangioma.  2.6 cm right renal mass c/w neoplasm.  Pt treated w Augmentin.   05/2021 ED visit for rectal bleeding, attributed to anal fissure versus hemorrhoid.  Prescribed hydrocortisone cream, referred to GI (no GI fup).   In last few weeks having limited, low level chest pressure w walking from barn to house.  Stable lightheadedness when standing.  Some mild DOE Wilburn Mylar was fixing washing machine in  basement, required tipping it over/heavy exertion.  Developed 3/10 chest pressure that eased but recurred while eating a sandwich.  Pain similar to prior presentations leading to stent placement.  He took 3 SL NTG with some improvement.  Took 4 ASA 81 while waiting for EMS. Pain free since arrival in ED. No ischemic changes on initial EKG.  Troponins trending up 93..  425.. 651.. 609.   Hgb 8.7.. 8.2.. 8.6. Hgb 13.1 fourteen months ago. MCV 83.  INR 1.2.   Iron 25, ferritin 6.  Low iron sats.  B12, folate normal. BUN normal.  Creat 1.4..  1.1.  GFR 45.. > 60.  Glucose 321. Chest/ABD/pelvis shows stable 2.6 cm right kidney lesion compatible with solid neoplasm.  No metastatic disease.  Cholelithiasis without complications.  Sigmoid diverticulosis.  Liver normal  Protonix 40 IV bid initiated.  Eliquis held, last dose 1/29.  No PRBCs thus far.    Pt denies altered bowel habits, bloody stools, anorexia, weight loss dysphagia, heartburn, excessive or unusual bleeding..  Normally has 1 to 2 g daily.  Cards, Dr Hassell Done: "angina is likely being driven by demand created by the 5-point drop in Hgb.. . LHC could be considered to reevaluate for any significant lesion that might be amenable to PCI; however given the possibility of acute GIB, would recommend  waiting to further clarify the reason for his drop in Hgb before proceeding with LHC and possible PCI."   Both parents lived into their early 42s.  Mom valvular heart disease.  Heart disease ulcer disease, GI complaints, colorectal or intestinal neoplasia. Replace lives [place].  Baseline lives about 12 miles away and his daughter lives near [place]. No history of alcohol consumption or significant tobacco use.  Past Medical History:  Diagnosis Date   AC (acromioclavicular) joint bone spurs    lt ankle   Allergy    allergic rhinitis   Anginal pain (HCC)    Arthritis    OA   BPH (benign prostatic hyperplasia)    microwave tx of prostate   CAD  (coronary artery disease)    a. s/p cath in 2012 showing 70-75% LAD stenosis, 75% D1 stenosis, 75% OM1, 60-70% RCA stenosis, and 80% PDA --> medical therapy pursued b. similar findings by cath in 2016; 06/2017 DES to distal RCA   Chronic kidney disease    Chronic upper back pain    Diabetes mellitus    type II x8 years   GERD (gastroesophageal reflux disease)    HLD (hyperlipidemia)    10/12: TC 208, TG 213, HDL 36, LDL 129   Hx of skin cancer, basal cell    many skin cancers removed/under constant treatment.   Hypertension    NSTEMI (non-ST elevated myocardial infarction) ([facility]) 06/18/2017   DES to distal RCA   Obstructive sleep apnea on CPAP    Paroxysmal atrial flutter ([place])    Renal mass    Scoliosis     Past Surgical History:  Procedure Laterality Date   BREAST SURGERY  2009   breast lump removed benign   BUBBLE STUDY  10/14/2020   Procedure: BUBBLE STUDY;  Surgeon: [name], MD;  Location: [facility];  Service: Cardiovascular;;   CARDIAC CATHETERIZATION N/A 03/15/2015   Procedure: Left Heart Cath and Coronary Angiography;  Surgeon: Belva Crome, MD;  Location: [facility];  Service: Cardiovascular;  Laterality: N/A;   CARDIOVERSION N/A 10/14/2020   Procedure: CARDIOVERSION;  Surgeon: [name], MD;  [facility];  Service: Cardiovascular;  Laterality: N/A;   CATARACT EXTRACTION W/PHACO Right 11/29/2021   Procedure: CATARACT EXTRACTION PHACO AND INTRAOCULAR LENS PLACEMENT ([place]) RIGHT DIABETIC;  Surgeon: Leandrew Koyanagi, MD;  Location: [facility];  Service: Ophthalmology;  Laterality: Right;  Diabetic 18.72 01:54.3   CORONARY STENT INTERVENTION N/A 06/18/2017   Procedure: CORONARY STENT INTERVENTION;  Surgeon: Jettie Booze, MD;  Location: [facility];  Service: Cardiovascular;  Laterality: N/A;  RCA   LEFT HEART CATH AND CORONARY ANGIOGRAPHY N/A 06/18/2017   Procedure: LEFT HEART CATH AND CORONARY ANGIOGRAPHY;   Surgeon: Jettie Booze, MD;  Location: [facility];  Service: Cardiovascular;  Laterality: N/A;   LEFT HEART CATH AND CORONARY ANGIOGRAPHY N/A 04/12/2020   Procedure: LEFT HEART CATH AND CORONARY ANGIOGRAPHY;  Surgeon: Martinique, Peter M, MD;  Location: [facility];  Service: Cardiovascular;  Laterality: N/A;   TEE WITHOUT CARDIOVERSION N/A 10/14/2020   Procedure: TRANSESOPHAGEAL ECHOCARDIOGRAM (TEE);  Surgeon: [name], MD;  Location: [facility];  Service: Cardiovascular;  Laterality: N/A;   ULTRASOUND GUIDANCE FOR VASCULAR ACCESS  06/18/2017   Procedure: Ultrasound Guidance For Vascular Access;  Surgeon: Jettie Booze, MD;  Location: [facility];  Service: Cardiovascular;;  right radial, right femoral    Prior to Admission medications  Medication Sig Start Date End Date Taking? Authorizing Provider  acetaminophen (TYLENOL) 325 MG tablet Take 325-650 mg by mouth every 6 (six) hours as needed for mild pain or headache.   Yes [provider]  apixaban (ELIQUIS) 5 MG TABS tablet Take 1 tablet (5 mg total) by mouth 2 (two) times daily. 11/22/21  Yes Lelon Perla, MD  Coenzyme Q10 (CO Q 10 PO) Take 200 mg by mouth daily.   Yes [provider]  Cyanocobalamin (VITAMIN B12) 1000 MCG TBCR Take 1,000 mcg by mouth daily. 04/12/20  Yes Cheryln Manly, NP  fluticasone (FLONASE) 50 MCG/ACT nasal spray Place 1 spray into both nostrils daily as needed for allergies.    Yes [provider]  furosemide (LASIX) 20 MG tablet Take 20 mg by mouth daily. 07/18/22  Yes [provider]  glipiZIDE (GLUCOTROL) 10 MG tablet TAKE 1 TABLET TWICE DAILY WITH MEALS Patient taking differently: Take 10 mg by mouth 2 (two) times daily. 12/12/21  Yes Tower, Wynelle Fanny, MD  insulin glargine (LANTUS SOLOSTAR) 100 UNIT/ML Solostar Pen Inject 50 Units into the skin at bedtime. Patient taking differently: Inject 51 Units into the skin at bedtime. 10/11/21   Yes Tower, Wynelle Fanny, MD  isosorbide mononitrate (IMDUR) 60 MG 24 hr tablet TAKE 1 TABLET EVERY DAY Patient taking differently: Take 60 mg by mouth daily. 02/05/22  Yes Lelon Perla, MD  metFORMIN (GLUCOPHAGE) 1000 MG tablet TAKE 1 TABLET TWICE DAILY WITH MEALS Patient taking differently: Take 1,000 mg by mouth 2 (two) times daily. 10/25/21  Yes Tower, Wynelle Fanny, MD  metoprolol tartrate (LOPRESSOR) 25 MG tablet Take 0.5 tablets (12.5 mg total) by mouth 2 (two) times daily. 12/12/21  Yes Crenshaw, Denice Bors, MD  NOVOLOG FLEXPEN 100 UNIT/ML FlexPen Inject 16 Units into the skin daily. Before dinner 09/14/21  Yes [provider]  rosuvastatin (CRESTOR) 40 MG tablet Take 1 tablet (40 mg total) by mouth daily. 01/11/22 07/31/22 Yes Lelon Perla, MD  Alcohol Swabs (B-D SINGLE USE SWABS REGULAR) PADS Use to check blood sugar 2 times a day 03/30/21   Tower, Wynelle Fanny, MD  Blood Glucose Calibration (TRUE METRIX LEVEL 1) Low SOLN Use to check control on glucose meter 03/30/21   Tower, Wynelle Fanny, MD  Blood Glucose Monitoring Suppl (TRUE METRIX AIR GLUCOSE METER) w/Device KIT 1 kit by Other route 2 (two) times daily. Check blood sugar twice daily and as directed. Dx E11.65 03/30/21   Tower, Wynelle Fanny, MD  DROPLET PEN NEEDLES 31G X 6 MM MISC USE ONE TIME DAILY  AT  BEDTIME  WITH  LANTUS 01/01/22   Tower, Wynelle Fanny, MD  nitroGLYCERIN (NITROSTAT) 0.4 MG SL tablet Place 1 tablet (0.4 mg total) under the tongue every 5 (five) minutes as needed for chest pain. 05/11/19   Duke, Tami Lin, PA  TRUE METRIX BLOOD GLUCOSE TEST test strip CHECK BLOOD SUGAR TWICE DAILY 01/26/22   Tower, Wynelle Fanny, MD  TRUEplus Lancets 30G MISC Check blood sugar twice daily and as directed. Dx E11.65 03/30/21   Abner Greenspan, MD    Scheduled Meds:  insulin aspart  0-15 Units Subcutaneous Q4H   isosorbide mononitrate  30 mg Oral Daily   metoprolol tartrate  12.5 mg Oral BID   pantoprazole (PROTONIX) IV  40 mg Intravenous BID   rosuvastatin  40  mg Oral Daily   Infusions:  PRN Meds: acetaminophen, melatonin, prochlorperazine   Allergies as of 07/30/2022 - Review  Complete 07/30/2022  Allergen Reaction Noted   Loratadine Other (See Comments) 11/08/2009   Simvastatin Other (See Comments) 09/16/2008    Family History  Problem Relation Age of Onset   Heart attack Brother    Colon cancer Neg Hx    Colon polyps Neg Hx    Stomach cancer Neg Hx    Esophageal cancer Neg Hx     Social History   Socioeconomic History   Marital status: Married    Spouse name: Antonius Hartlage   Number of children: 3   Years of education: Not on file   Highest education level: Not on file  Occupational History   Not on file  Tobacco Use   Smoking status: Never    Passive exposure: Never   Smokeless tobacco: Never  Vaping Use   Vaping Use: Never used  Substance and Sexual Activity   Alcohol use: No    Alcohol/week: 0.0 standard drinks of alcohol   Drug use: No   Sexual activity: Not Currently  Other Topics Concern   Not on file  Social History Narrative   Married    Physically active hard of hearing   Social Determinants of Health   Financial Resource Strain: Low Risk  (06/29/2021)   Overall Financial Resource Strain (CARDIA)    Difficulty of Paying Living Expenses: Not hard at all  Food Insecurity: No Food Insecurity (06/29/2021)   Hunger Vital Sign    Worried About Running Out of Food in the Last Year: Never true    Ran Out of Food in the Last Year: Never true  Transportation Needs: No Transportation Needs (06/29/2021)   PRAPARE - Hydrologist (Medical): No    Lack of Transportation (Non-Medical): No  Physical Activity: Inactive (06/29/2021)   Exercise Vital Sign    Days of Exercise per Week: 0 days    Minutes of Exercise per Session: 0 min  Stress: No Stress Concern Present (06/29/2021)   [place]    Feeling of Stress : Not at  all  Social Connections: Moderately Integrated (06/29/2021)   Social Connection and Isolation Panel [NHANES]    Frequency of Communication with Friends and Family: More than three times a week    Frequency of Social Gatherings with Friends and Family: More than three times a week    Attends Religious Services: More than 4 times per year    Active Member of [employer] or Organizations: No    Attends Archivist Meetings: Never    Marital Status: Married  Human resources officer Violence: Not At Risk (06/29/2021)   Humiliation, Afraid, Rape, and Kick questionnaire    Fear of Current or Ex-Partner: No    Emotionally Abused: No    Physically Abused: No    Sexually Abused: No    REVIEW OF SYSTEMS: Constitutional: No weakness or fatigue. ENT:  No nose bleeds Pulm: Nightly CPAP.  Mild DOE at times.  No cough. CV:  No palpitations, no LE edema.  GU: Urinary frequency and urgency stable/chronic. GI: See HPI. Heme: No unusual bleeding or bruising. Transfusions: None Neuro:  No headaches, no peripheral tingling or numbness.  No syncope, no seizures. Derm:  No itching, no rash or sores.  Endocrine:  No sweats or chills.  No polyuria or dysuria Immunization: Reviewed. Travel: Not queried.   PHYSICAL EXAM: Vital signs in last 24 hours: Vitals:   07/31/22 0646 07/31/22 0730  BP:  (!) 151/68  Pulse:  69  Resp: 18 17  Temp:    SpO2:  100%   Wt Readings from Last 3 Encounters:  07/05/22 116.8 kg  07/03/22 114.8 kg  05/09/22 115.1 kg    General: Pleasant, obese, comfortable.  Does not look acutely ill and appears well for his 88 years. Head: No facial asymmetry or swelling.  No signs of head trauma.   Eyes:  Stitches at 1.5 inch long suture line at left eyebrow.   No conjunctival pallor Ears: Hard of hearing, his hearing aids are not in place.  He speaks laterally but then slowly he can understand. Nose: No congestion or discharge Mouth: Tongue midline.  Mucosa moist, pink,  clear. Neck: No JVD, no masses, no thyromegaly Lungs: Diminished breath sounds but clear.  No labored breathing.  No cough Heart: RRR.  No MRG.   Abdomen:  soft, obese.  NT, ND.  No HSM, masses, bruits, hernias.  Bowel sounds active..   Rectal: Masses.  No visible blood.  Stool is brown with trace FOBT. Musc/Skeltl: No joint redness, swelling or gross deformities. Extremities: No CCE. Neurologic: Alert.  Oriented x 3.  Hard of hearing.  Moves all 4 limbs without gross deficits.  Formal strength testing not performed.  No tremors. Skin: Scattered purpura on the arms. Tattoos: None observed. Nodes: No cervical adenopathy. Psych: Calm, cooperative, pleasant.  Fluid speech.  Intake/Output from previous day: 01/29 0701 - 01/30 0700 In: -  Out: 600 [Urine:600] Intake/Output this shift: No intake/output data recorded.  LAB RESULTS: Recent Labs    07/30/22 1655 07/31/22 0336 07/31/22 0447  WBC 6.8 6.0  --   HGB 8.7* 8.2* 8.6*  HCT 29.6* 27.0* 28.4*  PLT 205 178  --    BMET Lab Results  Component Value Date   NA 140 07/31/2022   NA 139 07/30/2022   NA 144 12/06/2021   K 4.0 07/31/2022   K 4.5 07/30/2022   K 4.6 12/06/2021   CL 106 07/31/2022   CL 107 07/30/2022   CL 105 12/06/2021   CO2 24 07/31/2022   CO2 23 07/30/2022   CO2 25 12/06/2021   GLUCOSE 274 (H) 07/31/2022   GLUCOSE 321 (H) 07/30/2022   GLUCOSE 217 (H) 12/06/2021   BUN 22 07/31/2022   BUN 23 07/30/2022   BUN 22 12/06/2021   CREATININE 1.14 07/31/2022   CREATININE 1.49 (H) 07/30/2022   CREATININE 1.24 12/06/2021   CALCIUM 8.7 (L) 07/31/2022   CALCIUM 8.4 (L) 07/30/2022   CALCIUM 9.5 12/06/2021   LFT Recent Labs    07/31/22 0336  PROT 5.8*  ALBUMIN 3.1*  AST 23  ALT 16  ALKPHOS 75  BILITOT 0.4   PT/INR Lab Results  Component Value Date   INR 1.2 07/31/2022   INR 1.2 10/13/2020   INR 1.03 09/22/2017   Hepatitis Panel No results for input(s): "HEPBSAG", "HCVAB", "HEPAIGM", "HEPBIGM" in the  last 72 hours. C-Diff No components found for: "CDIFF" Lipase     Component Value Date/Time   LIPASE 28 04/16/2020 0111    Drugs of Abuse  No results found for: "LABOPIA", "COCAINSCRNUR", "LABBENZ", "AMPHETMU", "THCU", "LABBARB"   RADIOLOGY STUDIES: ECHOCARDIOGRAM COMPLETE  Result Date: 07/31/2022    ECHOCARDIOGRAM REPORT   Patient Name:   CORBITT CLOKE Date of Exam: 07/31/2022 Medical Rec #:  [id number]     Height:       71.0 in Accession #:    [id number]    Weight:  257.5 lb Date of Birth:  [birth year]     BSA:          2.348 m Patient Age:    77 years      BP:           151/68 mmHg Patient Gender: M             HR:           64 bpm. Exam Location:  Inpatient Procedure: 2D Echo, Cardiac Doppler and Color Doppler                       STAT ECHO Reported to: Dr Eleonore Chiquito on 07/31/2022 8:06:00 AM. Indications:    NSTEMI  History:        Patient has prior history of Echocardiogram examinations, most                 recent 10/14/2020. Previous Myocardial Infarction,                 Arrythmias:Atrial Fibrillation; Risk Factors:Diabetes,                 Hypertension and Sleep Apnea.  Sonographer:    Clayton Lefort RDCS (AE) Referring Phys: [id number] [place]  1. Left ventricular ejection fraction, by estimation, is 60 to 65%. The left ventricle has normal function. The left ventricle has no regional wall motion abnormalities. There is mild concentric left ventricular hypertrophy. Left ventricular diastolic function could not be evaluated.  2. Right ventricular systolic function is normal. The right ventricular size is normal. There is mildly elevated pulmonary artery systolic pressure. The estimated right ventricular systolic pressure is 50.2 mmHg.  3. The mitral valve is grossly normal. No evidence of mitral valve regurgitation. No evidence of mitral stenosis.  4. The aortic valve is tricuspid. There is mild calcification of the aortic valve. There is mild thickening of the aortic valve.  Aortic valve regurgitation is not visualized. Aortic valve sclerosis/calcification is present, without any evidence of aortic stenosis.  5. The inferior vena cava is normal in size with greater than 50% respiratory variability, suggesting right atrial pressure of 3 mmHg. Comparison(s): No significant change from prior study. FINDINGS  Left Ventricle: Left ventricular ejection fraction, by estimation, is 60 to 65%. The left ventricle has normal function. The left ventricle has no regional wall motion abnormalities. The left ventricular internal cavity size was normal in size. There is  mild concentric left ventricular hypertrophy. Left ventricular diastolic function could not be evaluated due to atrial fibrillation. Left ventricular diastolic function could not be evaluated. Right Ventricle: The right ventricular size is normal. No increase in right ventricular wall thickness. Right ventricular systolic function is normal. There is mildly elevated pulmonary artery systolic pressure. The tricuspid regurgitant velocity is 2.90  m/s, and with an assumed right atrial pressure of 3 mmHg, the estimated right ventricular systolic pressure is 77.4 mmHg. Left Atrium: Left atrial size was normal in size. Right Atrium: Right atrial size was normal in size. Pericardium: There is no evidence of pericardial effusion. Mitral Valve: The mitral valve is grossly normal. No evidence of mitral valve regurgitation. No evidence of mitral valve stenosis. Tricuspid Valve: The tricuspid valve is grossly normal. Tricuspid valve regurgitation is mild . No evidence of tricuspid stenosis. Aortic Valve: The aortic valve is tricuspid. There is mild calcification of the aortic valve. There is mild thickening of the aortic valve. Aortic valve regurgitation is not visualized.  Aortic valve sclerosis/calcification is present, without any evidence of aortic stenosis. Aortic valve mean gradient measures 5.0 mmHg. Aortic valve peak gradient measures 9.7  mmHg. Aortic valve area, by VTI measures 2.75 cm. Pulmonic Valve: The pulmonic valve was grossly normal. Pulmonic valve regurgitation is not visualized. No evidence of pulmonic stenosis. Aorta: The aortic root and ascending aorta are structurally normal, with no evidence of dilitation. Venous: The inferior vena cava is normal in size with greater than 50% respiratory variability, suggesting right atrial pressure of 3 mmHg. IAS/Shunts: The atrial septum is grossly normal.  LEFT VENTRICLE PLAX 2D LVIDd:         4.80 cm LVIDs:         3.10 cm LV PW:         1.10 cm LV IVS:        1.20 cm LVOT diam:     2.10 cm LV SV:         93 LV SV Index:   40 LVOT Area:     3.46 cm  RIGHT VENTRICLE             IVC RV Basal diam:  3.20 cm     IVC diam: 1.40 cm RV S prime:     13.90 cm/s TAPSE (M-mode): 2.7 cm LEFT ATRIUM              Index        RIGHT ATRIUM           Index LA diam:        3.90 cm  1.66 cm/m   RA Area:     21.60 cm LA Vol (A2C):   104.0 ml 44.30 ml/m  RA Volume:   59.20 ml  25.22 ml/m LA Vol (A4C):   70.2 ml  29.90 ml/m LA Biplane Vol: 88.7 ml  37.78 ml/m  AORTIC VALVE AV Area (Vmax):    2.86 cm AV Area (Vmean):   2.80 cm AV Area (VTI):     2.75 cm AV Vmax:           156.00 cm/s AV Vmean:          105.000 cm/s AV VTI:            0.338 m AV Peak Grad:      9.7 mmHg AV Mean Grad:      5.0 mmHg LVOT Vmax:         129.00 cm/s LVOT Vmean:        84.900 cm/s LVOT VTI:          0.268 m LVOT/AV VTI ratio: 0.79  AORTA Ao Root diam: 3.40 cm Ao Asc diam:  3.60 cm TRICUSPID VALVE TR Peak grad:   33.6 mmHg TR Vmax:        290.00 cm/s  SHUNTS Systemic VTI:  0.27 m Systemic Diam: 2.10 cm Eleonore Chiquito MD Electronically signed by Eleonore Chiquito MD Signature Date/Time: 07/31/2022/8:20:37 AM    Final    CT CHEST ABDOMEN PELVIS W CONTRAST  Result Date: 07/31/2022 CLINICAL DATA:  Chest pain, known right renal mass, evaluate for metastatic disease EXAM: CT CHEST, ABDOMEN, AND PELVIS WITH CONTRAST TECHNIQUE: Multidetector CT  imaging of the chest, abdomen and pelvis was performed following the standard protocol during bolus administration of intravenous contrast. RADIATION DOSE REDUCTION: This exam was performed according to the departmental dose-optimization program which includes automated exposure control, adjustment of the mA and/or kV according to patient size and/or use of iterative reconstruction  technique. CONTRAST:  86m OMNIPAQUE IOHEXOL 350 MG/ML SOLN COMPARISON:  MRI abdomen dated 08/14/2021. CTA chest dated 10/12/2020. FINDINGS: CT CHEST FINDINGS Cardiovascular: Heart is normal in size.  No pericardial effusion. No evidence of thoracic aortic aneurysm. Atherosclerotic calcifications of the aortic arch. Severe three-vessel coronary atherosclerosis. Mediastinum/Nodes: 10 mm short axis right azygoesophageal recess node, previously 12 mm. Additional calcified right perihilar nodes. No suspicious mediastinal lymphadenopathy. Visualized thyroid is unremarkable. Lungs/Pleura: No suspicious pulmonary nodules. Calcified granuloma in the right lower lobe, benign. Mild subpleural reticulation/scarring in the lungs bilaterally, lower lobe predominant. No pleural effusion or pneumothorax. Musculoskeletal: Moderate degenerative changes of the thoracic spine. No focal osseous lesions. CT ABDOMEN PELVIS FINDINGS Hepatobiliary: Liver is within normal limits. No suspicious/enhancing hepatic lesions. Layering small gallstones (series 3/image 66), without associated inflammatory changes. No intrahepatic or extrahepatic duct dilatation. Pancreas: Within normal limits. Spleen: Within normal limits. Adrenals/Urinary Tract: Adrenal glands are within normal limits. Multiple simple bilateral renal cysts, measuring up to 14.8 cm in the left upper kidney (series 3/image 68), benign (Bosniak I). No follow-up is recommended. 2.5 x 2.6 cm enhancing lesion in the anterior right upper kidney (series 3/image 70), grossly unchanged from prior MRI, compatible  with solid renal neoplasm such as renal cell carcinoma. No hydronephrosis. Bladder is underdistended but unremarkable. Stomach/Bowel: Stomach is notable for a tiny hiatal hernia. No evidence of bowel obstruction. Normal appendix (series 3/image 98). Sigmoid diverticulosis, without evidence of diverticulitis. Vascular/Lymphatic: No evidence of abdominal aortic aneurysm. Atherosclerotic calcifications of the abdominal aorta and branch vessels. No suspicious abdominopelvic lymphadenopathy. Reproductive: Prostate is unremarkable. Other: No abdominopelvic ascites. Musculoskeletal: Mild anterior wedging at L1, chronic. Degenerative changes of the lumbar spine. No focal osseous lesions. IMPRESSION: 2.6 cm enhancing lesion in the anterior right upper kidney, grossly unchanged from prior MRI, compatible with solid renal neoplasm such as renal cell carcinoma. No findings suspicious for metastatic disease. Cholelithiasis, without associated inflammatory changes. Additional ancillary findings as above. Electronically Signed   By: SJulian HyM.D.   On: 07/31/2022 02:13   DG Chest 2 View  Result Date: 07/30/2022 CLINICAL DATA:  Chest pain EXAM: CHEST - 2 VIEW COMPARISON:  10/11/2020 FINDINGS: Cardiac silhouette enlarged. No evidence of pneumothorax or pleural effusion. Pulmonary vascular congestion. No evidence of pulmonary edema. No focal consolidation. Aorta is calcified. There are thoracic degenerative changes. IMPRESSION: Enlarged cardiac silhouette.  Pulmonary vascular congestion. Electronically Signed   By: JSammie BenchM.D.   On: 07/30/2022 17:57      IMPRESSION:    IDA.  4.5 g drop Hgb over 14 months.    NSTEMI, CAD.  Known 3 V disease with prior stenting.  Not on DAPT.  Cardiologist, Dr. SDelfina Redwood notes "angina is likely being driven by demand created by 5 point drop in Hgb..  Given possibility of acute GI bleed recommend waiting to clarify reason for (anemia) before proceeding to [place] also  suggested "consider transfusion to get Hgb > 10 should anginal symptoms recur at rest".     Adenomatous colon polyps 2006, 2012. Positive cologuard 2018.  Dr DLoletha CarrowGI declined to pursue subsequent colonoscopy due to CAD. Today's echocardiogram with LVEF 60 to 65%.  Function could not be evaluated.  Normal right RV systolic function.  No significant valvular disease or regurgitation.    PAF, on Eliquis.  Last dose 1/29. Cardiology endorses holding Eliquis in setting of anemia, possible GIB    Mild AKI, resolved.      Stable R kidney mass dates back  at least 3.5 years, suspicious for cancer, followed by urology.  ? Is this contributing to anemia?      IDDM.  PLAN:       Consider treatment w Feraheme/Ferrlecit.       Clear liquids.  Switch to po Protonix 40 mg daily    H and H tonight, CBC in AM.      Dr Hilarie Fredrickson will confer w cardiology re EGD/Colonoscopy.    Given the history and exam findings on rectal exam, this is almost certainly a slow process of GI blood loss leading to her anemia.  It is probably safe to use heparin but await Dr. Vena Rua opinion.   Azucena Freed  07/31/2022, 8:21 AM Phone [phone]

## 2022-07-31 NOTE — Progress Notes (Signed)
Patient admitted early this morning for chest pain and was found to have positive troponins and drop in hemoglobin.  Patient seen and examined at bedside.  I have reviewed patient's medical records including this morning's H&P, current vitals, labs, medications myself.  Cardiology evaluation appreciated.  Await GI evaluation.  Repeat a.m. labs.

## 2022-07-31 NOTE — H&P (View-Only) (Signed)
[facility] Consult: 8:21 AM 07/31/2022  LOS: 0 days    Referring Provider: Dr Starla Link  Primary Care Physician:  Tower, Wynelle Fanny, MD Primary Gastroenterologist:  Dr. Loletha Carrow.       Reason for Consultation:  Anemia, trace FOBT +.  Angina, STEMI vs NSTEMI.     HPI: Ryan Garcia is a 89 y.o. male.  HX severe 3 V CAD, NSTEMI 2016.  sp stenting 06/2017, angioplasty/stenting 04/2020.  Patient declined CABG in 2021.  CHF D.  Hypertension.  R renal mass, followed by urology.  Fatty liver.  Cholelithiasis.  Afib on eliquis.  OSA on CPAP.  Hearing deficit.  Last week dermatology skin cancer following the 5 bilateral, stitches are still in place.   08/2010 colonoscopy for adenoma surveillance.  Initial adenomas in 2006.  2 polyps (Tubular adenomas) removed from ascending colon.  Scattered ascending tics, moderate sigmoid tics.  Diminutive polyp descending colon (polypoid colorectal mucosa). Positive Cologuard 2018 but Dr. Loletha Carrow felt patient too high risk for colonoscopy.   Elevated LFTs 04/2020, Dr. Lyndel Safe suspected passed CBD stone and treatment for cholangitis.  CT showed sigmoid diverticulitis, uncomplicated cholelithiasis, fatty liver.  MRI/MRCP showed simple cholelithiasis, inflammation/thickening at D2 cw  duodenitis.  Small duodenal diverticulum containing fluid.  No ductal dilatation.  Small hepatic cyst, small hepatic hemangioma.  2.6 cm right renal mass c/w neoplasm.  Pt treated w Augmentin.   05/2021 ED visit for rectal bleeding, attributed to anal fissure versus hemorrhoid.  Prescribed hydrocortisone cream, referred to GI (no GI fup).   In last few weeks having limited, low level chest pressure w walking from barn to house.  Stable lightheadedness when standing.  Some mild DOE Wilburn Mylar was fixing washing machine in  basement, required tipping it over/heavy exertion.  Developed 3/10 chest pressure that eased but recurred while eating a sandwich.  Pain similar to prior presentations leading to stent placement.  He took 3 SL NTG with some improvement.  Took 4 ASA 81 while waiting for EMS. Pain free since arrival in ED. No ischemic changes on initial EKG.  Troponins trending up 93..  425.. 651.. 609.   Hgb 8.7.. 8.2.. 8.6. Hgb 13.1 fourteen months ago. MCV 83.  INR 1.2.   Iron 25, ferritin 6.  Low iron sats.  B12, folate normal. BUN normal.  Creat 1.4..  1.1.  GFR 45.. > 60.  Glucose 321. Chest/ABD/pelvis shows stable 2.6 cm right kidney lesion compatible with solid neoplasm.  No metastatic disease.  Cholelithiasis without complications.  Sigmoid diverticulosis.  Liver normal  Protonix 40 IV bid initiated.  Eliquis held, last dose 1/29.  No PRBCs thus far.    Pt denies altered bowel habits, bloody stools, anorexia, weight loss dysphagia, heartburn, excessive or unusual bleeding..  Normally has 1 to 2 g daily.  Cards, Dr Hassell Done: "angina is likely being driven by demand created by the 5-point drop in Hgb.. . LHC could be considered to reevaluate for any significant lesion that might be amenable to PCI; however given the possibility of acute GIB, would recommend  waiting to further clarify the reason for his drop in Hgb before proceeding with LHC and possible PCI."   Both parents lived into their early 90s.  Mom valvular heart disease.  Heart disease ulcer disease, GI complaints, colorectal or intestinal neoplasia. Replace lives [place].  Baseline lives about 12 miles away and his daughter lives near [place]. No history of alcohol consumption or significant tobacco use.  Past Medical History:  Diagnosis Date   AC (acromioclavicular) joint bone spurs    lt ankle   Allergy    allergic rhinitis   Anginal pain (HCC)    Arthritis    OA   BPH (benign prostatic hyperplasia)    microwave tx of prostate   CAD  (coronary artery disease)    a. s/p cath in 2012 showing 70-75% LAD stenosis, 75% D1 stenosis, 75% OM1, 60-70% RCA stenosis, and 80% PDA --> medical therapy pursued b. similar findings by cath in 2016; 06/2017 DES to distal RCA   Chronic kidney disease    Chronic upper back pain    Diabetes mellitus    type II x8 years   GERD (gastroesophageal reflux disease)    HLD (hyperlipidemia)    10/12: TC 208, TG 213, HDL 36, LDL 129   Hx of skin cancer, basal cell    many skin cancers removed/under constant treatment.   Hypertension    NSTEMI (non-ST elevated myocardial infarction) ([place]) 06/18/2017   DES to distal RCA   Obstructive sleep apnea on CPAP    Paroxysmal atrial flutter ([place])    Renal mass    Scoliosis     Past Surgical History:  Procedure Laterality Date   BREAST SURGERY  2009   breast lump removed benign   BUBBLE STUDY  10/14/2020   Procedure: BUBBLE STUDY;  Surgeon: [name], MD;  Location: [place];  Service: Cardiovascular;;   CARDIAC CATHETERIZATION N/A 03/15/2015   Procedure: Left Heart Cath and Coronary Angiography;  Surgeon: Belva Crome, MD;  Location: [place] [facility];  Service: Cardiovascular;  Laterality: N/A;   CARDIOVERSION N/A 10/14/2020   Procedure: CARDIOVERSION;  Surgeon: [name], MD;  Location: [facility] ENDOSCOPY;  Service: Cardiovascular;  Laterality: N/A;   CATARACT EXTRACTION W/PHACO Right 11/29/2021   Procedure: CATARACT EXTRACTION PHACO AND INTRAOCULAR LENS PLACEMENT ([place]) RIGHT DIABETIC;  Surgeon: Leandrew Koyanagi, MD;  Location: [place];  Service: Ophthalmology;  Laterality: Right;  Diabetic 18.72 01:54.3   CORONARY STENT INTERVENTION N/A 06/18/2017   Procedure: CORONARY STENT INTERVENTION;  Surgeon: Jettie Booze, MD;  Location: [place] [facility];  Service: Cardiovascular;  Laterality: N/A;  RCA   LEFT HEART CATH AND CORONARY ANGIOGRAPHY N/A 06/18/2017   Procedure: LEFT HEART CATH AND CORONARY ANGIOGRAPHY;   Surgeon: Jettie Booze, MD;  Location: [place] [facility];  Service: Cardiovascular;  Laterality: N/A;   LEFT HEART CATH AND CORONARY ANGIOGRAPHY N/A 04/12/2020   Procedure: LEFT HEART CATH AND CORONARY ANGIOGRAPHY;  Surgeon: Martinique, Peter M, MD;  Location: [place] [facility];  Service: Cardiovascular;  Laterality: N/A;   TEE WITHOUT CARDIOVERSION N/A 10/14/2020   Procedure: TRANSESOPHAGEAL ECHOCARDIOGRAM (TEE);  Surgeon: [name], MD;  Location: [place];  Service: Cardiovascular;  Laterality: N/A;   ULTRASOUND GUIDANCE FOR VASCULAR ACCESS  06/18/2017   Procedure: Ultrasound Guidance For Vascular Access;  Surgeon: Jettie Booze, MD;  Location: [place] [facility];  Service: Cardiovascular;;  right radial, right femoral    Prior to Admission medications  Medication Sig Start Date End Date Taking? Authorizing Provider  acetaminophen (TYLENOL) 325 MG tablet Take 325-650 mg by mouth every 6 (six) hours as needed for mild pain or headache.   Yes [provider]  apixaban (ELIQUIS) 5 MG TABS tablet Take 1 tablet (5 mg total) by mouth 2 (two) times daily. 11/22/21  Yes Lelon Perla, MD  Coenzyme Q10 (CO Q 10 PO) Take 200 mg by mouth daily.   Yes [provider]  Cyanocobalamin (VITAMIN B12) 1000 MCG TBCR Take 1,000 mcg by mouth daily. 04/12/20  Yes Cheryln Manly, NP  fluticasone (FLONASE) 50 MCG/ACT nasal spray Place 1 spray into both nostrils daily as needed for allergies.    Yes [provider]  furosemide (LASIX) 20 MG tablet Take 20 mg by mouth daily. 07/18/22  Yes [provider]  glipiZIDE (GLUCOTROL) 10 MG tablet TAKE 1 TABLET TWICE DAILY WITH MEALS Patient taking differently: Take 10 mg by mouth 2 (two) times daily. 12/12/21  Yes Tower, Wynelle Fanny, MD  insulin glargine (LANTUS SOLOSTAR) 100 UNIT/ML Solostar Pen Inject 50 Units into the skin at bedtime. Patient taking differently: Inject 51 Units into the skin at bedtime. 10/11/21   Yes Tower, Wynelle Fanny, MD  isosorbide mononitrate (IMDUR) 60 MG 24 hr tablet TAKE 1 TABLET EVERY DAY Patient taking differently: Take 60 mg by mouth daily. 02/05/22  Yes Lelon Perla, MD  metFORMIN (GLUCOPHAGE) 1000 MG tablet TAKE 1 TABLET TWICE DAILY WITH MEALS Patient taking differently: Take 1,000 mg by mouth 2 (two) times daily. 10/25/21  Yes Tower, Wynelle Fanny, MD  metoprolol tartrate (LOPRESSOR) 25 MG tablet Take 0.5 tablets (12.5 mg total) by mouth 2 (two) times daily. 12/12/21  Yes Crenshaw, Denice Bors, MD  NOVOLOG FLEXPEN 100 UNIT/ML FlexPen Inject 16 Units into the skin daily. Before dinner 09/14/21  Yes [provider]  rosuvastatin (CRESTOR) 40 MG tablet Take 1 tablet (40 mg total) by mouth daily. 01/11/22 07/31/22 Yes Lelon Perla, MD  Alcohol Swabs (B-D SINGLE USE SWABS REGULAR) PADS Use to check blood sugar 2 times a day 03/30/21   Tower, Wynelle Fanny, MD  Blood Glucose Calibration (TRUE METRIX LEVEL 1) Low SOLN Use to check control on glucose meter 03/30/21   Tower, Wynelle Fanny, MD  Blood Glucose Monitoring Suppl (TRUE METRIX AIR GLUCOSE METER) w/Device KIT 1 kit by Other route 2 (two) times daily. Check blood sugar twice daily and as directed. Dx E11.65 03/30/21   Tower, Wynelle Fanny, MD  DROPLET PEN NEEDLES 31G X 6 MM MISC USE ONE TIME DAILY  AT  BEDTIME  WITH  LANTUS 01/01/22   Tower, Wynelle Fanny, MD  nitroGLYCERIN (NITROSTAT) 0.4 MG SL tablet Place 1 tablet (0.4 mg total) under the tongue every 5 (five) minutes as needed for chest pain. 05/11/19   Duke, Tami Lin, PA  TRUE METRIX BLOOD GLUCOSE TEST test strip CHECK BLOOD SUGAR TWICE DAILY 01/26/22   Tower, Wynelle Fanny, MD  TRUEplus Lancets 30G MISC Check blood sugar twice daily and as directed. Dx E11.65 03/30/21   Abner Greenspan, MD    Scheduled Meds:  insulin aspart  0-15 Units Subcutaneous Q4H   isosorbide mononitrate  30 mg Oral Daily   metoprolol tartrate  12.5 mg Oral BID   pantoprazole (PROTONIX) IV  40 mg Intravenous BID   rosuvastatin  40  mg Oral Daily   Infusions:  PRN Meds: acetaminophen, melatonin, prochlorperazine   Allergies as of 07/30/2022 - Review  Complete 07/30/2022  Allergen Reaction Noted   Loratadine Other (See Comments) 11/08/2009   Simvastatin Other (See Comments) 09/16/2008    Family History  Problem Relation Age of Onset   Heart attack Brother    Colon cancer Neg Hx    Colon polyps Neg Hx    Stomach cancer Neg Hx    Esophageal cancer Neg Hx     Social History   Socioeconomic History   Marital status: Married    Spouse name: Ahmere Hemenway   Number of children: 3   Years of education: Not on file   Highest education level: Not on file  Occupational History   Not on file  Tobacco Use   Smoking status: Never    Passive exposure: Never   Smokeless tobacco: Never  Vaping Use   Vaping Use: Never used  Substance and Sexual Activity   Alcohol use: No    Alcohol/week: 0.0 standard drinks of alcohol   Drug use: No   Sexual activity: Not Currently  Other Topics Concern   Not on file  Social History Narrative   Married    Physically active hard of hearing   Social Determinants of Health   Financial Resource Strain: Low Risk  (06/29/2021)   Overall Financial Resource Strain (CARDIA)    Difficulty of Paying Living Expenses: Not hard at all  Food Insecurity: No Food Insecurity (06/29/2021)   Hunger Vital Sign    Worried About Running Out of Food in the Last Year: Never true    Ran Out of Food in the Last Year: Never true  Transportation Needs: No Transportation Needs (06/29/2021)   PRAPARE - Hydrologist (Medical): No    Lack of Transportation (Non-Medical): No  Physical Activity: Inactive (06/29/2021)   Exercise Vital Sign    Days of Exercise per Week: 0 days    Minutes of Exercise per Session: 0 min  Stress: No Stress Concern Present (06/29/2021)   [place]    Feeling of Stress : Not at  all  Social Connections: Moderately Integrated (06/29/2021)   Social Connection and Isolation Panel [NHANES]    Frequency of Communication with Friends and Family: More than three times a week    Frequency of Social Gatherings with Friends and Family: More than three times a week    Attends Religious Services: More than 4 times per year    Active Member of [employer] or Organizations: No    Attends Archivist Meetings: Never    Marital Status: Married  Human resources officer Violence: Not At Risk (06/29/2021)   Humiliation, Afraid, Rape, and Kick questionnaire    Fear of Current or Ex-Partner: No    Emotionally Abused: No    Physically Abused: No    Sexually Abused: No    REVIEW OF SYSTEMS: Constitutional: No weakness or fatigue. ENT:  No nose bleeds Pulm: Nightly CPAP.  Mild DOE at times.  No cough. CV:  No palpitations, no LE edema.  GU: Urinary frequency and urgency stable/chronic. GI: See HPI. Heme: No unusual bleeding or bruising. Transfusions: None Neuro:  No headaches, no peripheral tingling or numbness.  No syncope, no seizures. Derm:  No itching, no rash or sores.  Endocrine:  No sweats or chills.  No polyuria or dysuria Immunization: Reviewed. Travel: Not queried.   PHYSICAL EXAM: Vital signs in last 24 hours: Vitals:   07/31/22 0646 07/31/22 0730  BP:  (!) 151/68  Pulse:  69  Resp: 18 17  Temp:    SpO2:  100%   Wt Readings from Last 3 Encounters:  07/05/22 116.8 kg  07/03/22 114.8 kg  05/09/22 115.1 kg    General: Pleasant, obese, comfortable.  Does not look acutely ill and appears well for his 88 years. Head: No facial asymmetry or swelling.  No signs of head trauma.   Eyes:  Stitches at 1.5 inch long suture line at left eyebrow.   No conjunctival pallor Ears: Hard of hearing, his hearing aids are not in place.  He speaks laterally but then slowly he can understand. Nose: No congestion or discharge Mouth: Tongue midline.  Mucosa moist, pink,  clear. Neck: No JVD, no masses, no thyromegaly Lungs: Diminished breath sounds but clear.  No labored breathing.  No cough Heart: RRR.  No MRG.   Abdomen:  soft, obese.  NT, ND.  No HSM, masses, bruits, hernias.  Bowel sounds active..   Rectal: Masses.  No visible blood.  Stool is brown with trace FOBT. Musc/Skeltl: No joint redness, swelling or gross deformities. Extremities: No CCE. Neurologic: Alert.  Oriented x 3.  Hard of hearing.  Moves all 4 limbs without gross deficits.  Formal strength testing not performed.  No tremors. Skin: Scattered purpura on the arms. Tattoos: None observed. Nodes: No cervical adenopathy. Psych: Calm, cooperative, pleasant.  Fluid speech.  Intake/Output from previous day: 01/29 0701 - 01/30 0700 In: -  Out: 600 [Urine:600] Intake/Output this shift: No intake/output data recorded.  LAB RESULTS: Recent Labs    07/30/22 1655 07/31/22 0336 07/31/22 0447  WBC 6.8 6.0  --   HGB 8.7* 8.2* 8.6*  HCT 29.6* 27.0* 28.4*  PLT 205 178  --    BMET Lab Results  Component Value Date   NA 140 07/31/2022   NA 139 07/30/2022   NA 144 12/06/2021   K 4.0 07/31/2022   K 4.5 07/30/2022   K 4.6 12/06/2021   CL 106 07/31/2022   CL 107 07/30/2022   CL 105 12/06/2021   CO2 24 07/31/2022   CO2 23 07/30/2022   CO2 25 12/06/2021   GLUCOSE 274 (H) 07/31/2022   GLUCOSE 321 (H) 07/30/2022   GLUCOSE 217 (H) 12/06/2021   BUN 22 07/31/2022   BUN 23 07/30/2022   BUN 22 12/06/2021   CREATININE 1.14 07/31/2022   CREATININE 1.49 (H) 07/30/2022   CREATININE 1.24 12/06/2021   CALCIUM 8.7 (L) 07/31/2022   CALCIUM 8.4 (L) 07/30/2022   CALCIUM 9.5 12/06/2021   LFT Recent Labs    07/31/22 0336  PROT 5.8*  ALBUMIN 3.1*  AST 23  ALT 16  ALKPHOS 75  BILITOT 0.4   PT/INR Lab Results  Component Value Date   INR 1.2 07/31/2022   INR 1.2 10/13/2020   INR 1.03 09/22/2017   Hepatitis Panel No results for input(s): "HEPBSAG", "HCVAB", "HEPAIGM", "HEPBIGM" in the  last 72 hours. C-Diff No components found for: "CDIFF" Lipase     Component Value Date/Time   LIPASE 28 04/16/2020 0111    Drugs of Abuse  No results found for: "LABOPIA", "COCAINSCRNUR", "LABBENZ", "AMPHETMU", "THCU", "LABBARB"   RADIOLOGY STUDIES: ECHOCARDIOGRAM COMPLETE  Result Date: 07/31/2022    ECHOCARDIOGRAM REPORT   Patient Name:   TAJH LIVSEY Date of Exam: 07/31/2022 Medical Rec #:  [id number]     Height:       71.0 in Accession #:    [id number]    Weight:  257.5 lb Date of Birth:  10/25/1933     BSA:          2.348 m Patient Age:    78 years      BP:           151/68 mmHg Patient Gender: M             HR:           64 bpm. Exam Location:  Inpatient Procedure: 2D Echo, Cardiac Doppler and Color Doppler                       STAT ECHO Reported to: Dr Eleonore Chiquito on 07/31/2022 8:06:00 AM. Indications:    NSTEMI  History:        Patient has prior history of Echocardiogram examinations, most                 recent 10/14/2020. Previous Myocardial Infarction,                 Arrythmias:Atrial Fibrillation; Risk Factors:Diabetes,                 Hypertension and Sleep Apnea.  Sonographer:    Clayton Lefort RDCS (AE) Referring Phys: [id number] [place]  1. Left ventricular ejection fraction, by estimation, is 60 to 65%. The left ventricle has normal function. The left ventricle has no regional wall motion abnormalities. There is mild concentric left ventricular hypertrophy. Left ventricular diastolic function could not be evaluated.  2. Right ventricular systolic function is normal. The right ventricular size is normal. There is mildly elevated pulmonary artery systolic pressure. The estimated right ventricular systolic pressure is 62.7 mmHg.  3. The mitral valve is grossly normal. No evidence of mitral valve regurgitation. No evidence of mitral stenosis.  4. The aortic valve is tricuspid. There is mild calcification of the aortic valve. There is mild thickening of the aortic valve.  Aortic valve regurgitation is not visualized. Aortic valve sclerosis/calcification is present, without any evidence of aortic stenosis.  5. The inferior vena cava is normal in size with greater than 50% respiratory variability, suggesting right atrial pressure of 3 mmHg. Comparison(s): No significant change from prior study. FINDINGS  Left Ventricle: Left ventricular ejection fraction, by estimation, is 60 to 65%. The left ventricle has normal function. The left ventricle has no regional wall motion abnormalities. The left ventricular internal cavity size was normal in size. There is  mild concentric left ventricular hypertrophy. Left ventricular diastolic function could not be evaluated due to atrial fibrillation. Left ventricular diastolic function could not be evaluated. Right Ventricle: The right ventricular size is normal. No increase in right ventricular wall thickness. Right ventricular systolic function is normal. There is mildly elevated pulmonary artery systolic pressure. The tricuspid regurgitant velocity is 2.90  m/s, and with an assumed right atrial pressure of 3 mmHg, the estimated right ventricular systolic pressure is 03.5 mmHg. Left Atrium: Left atrial size was normal in size. Right Atrium: Right atrial size was normal in size. Pericardium: There is no evidence of pericardial effusion. Mitral Valve: The mitral valve is grossly normal. No evidence of mitral valve regurgitation. No evidence of mitral valve stenosis. Tricuspid Valve: The tricuspid valve is grossly normal. Tricuspid valve regurgitation is mild . No evidence of tricuspid stenosis. Aortic Valve: The aortic valve is tricuspid. There is mild calcification of the aortic valve. There is mild thickening of the aortic valve. Aortic valve regurgitation is not visualized.  Aortic valve sclerosis/calcification is present, without any evidence of aortic stenosis. Aortic valve mean gradient measures 5.0 mmHg. Aortic valve peak gradient measures 9.7  mmHg. Aortic valve area, by VTI measures 2.75 cm. Pulmonic Valve: The pulmonic valve was grossly normal. Pulmonic valve regurgitation is not visualized. No evidence of pulmonic stenosis. Aorta: The aortic root and ascending aorta are structurally normal, with no evidence of dilitation. Venous: The inferior vena cava is normal in size with greater than 50% respiratory variability, suggesting right atrial pressure of 3 mmHg. IAS/Shunts: The atrial septum is grossly normal.  LEFT VENTRICLE PLAX 2D LVIDd:         4.80 cm LVIDs:         3.10 cm LV PW:         1.10 cm LV IVS:        1.20 cm LVOT diam:     2.10 cm LV SV:         93 LV SV Index:   40 LVOT Area:     3.46 cm  RIGHT VENTRICLE             IVC RV Basal diam:  3.20 cm     IVC diam: 1.40 cm RV S prime:     13.90 cm/s TAPSE (M-mode): 2.7 cm LEFT ATRIUM              Index        RIGHT ATRIUM           Index LA diam:        3.90 cm  1.66 cm/m   RA Area:     21.60 cm LA Vol (A2C):   104.0 ml 44.30 ml/m  RA Volume:   59.20 ml  25.22 ml/m LA Vol (A4C):   70.2 ml  29.90 ml/m LA Biplane Vol: 88.7 ml  37.78 ml/m  AORTIC VALVE AV Area (Vmax):    2.86 cm AV Area (Vmean):   2.80 cm AV Area (VTI):     2.75 cm AV Vmax:           156.00 cm/s AV Vmean:          105.000 cm/s AV VTI:            0.338 m AV Peak Grad:      9.7 mmHg AV Mean Grad:      5.0 mmHg LVOT Vmax:         129.00 cm/s LVOT Vmean:        84.900 cm/s LVOT VTI:          0.268 m LVOT/AV VTI ratio: 0.79  AORTA Ao Root diam: 3.40 cm Ao Asc diam:  3.60 cm TRICUSPID VALVE TR Peak grad:   33.6 mmHg TR Vmax:        290.00 cm/s  SHUNTS Systemic VTI:  0.27 m Systemic Diam: 2.10 cm Eleonore Chiquito MD Electronically signed by Eleonore Chiquito MD Signature Date/Time: 07/31/2022/8:20:37 AM    Final    CT CHEST ABDOMEN PELVIS W CONTRAST  Result Date: 07/31/2022 CLINICAL DATA:  Chest pain, known right renal mass, evaluate for metastatic disease EXAM: CT CHEST, ABDOMEN, AND PELVIS WITH CONTRAST TECHNIQUE: Multidetector CT  imaging of the chest, abdomen and pelvis was performed following the standard protocol during bolus administration of intravenous contrast. RADIATION DOSE REDUCTION: This exam was performed according to the departmental dose-optimization program which includes automated exposure control, adjustment of the mA and/or kV according to patient size and/or use of iterative reconstruction  technique. CONTRAST:  61m OMNIPAQUE IOHEXOL 350 MG/ML SOLN COMPARISON:  MRI abdomen dated 08/14/2021. CTA chest dated 10/12/2020. FINDINGS: CT CHEST FINDINGS Cardiovascular: Heart is normal in size.  No pericardial effusion. No evidence of thoracic aortic aneurysm. Atherosclerotic calcifications of the aortic arch. Severe three-vessel coronary atherosclerosis. Mediastinum/Nodes: 10 mm short axis right azygoesophageal recess node, previously 12 mm. Additional calcified right perihilar nodes. No suspicious mediastinal lymphadenopathy. Visualized thyroid is unremarkable. Lungs/Pleura: No suspicious pulmonary nodules. Calcified granuloma in the right lower lobe, benign. Mild subpleural reticulation/scarring in the lungs bilaterally, lower lobe predominant. No pleural effusion or pneumothorax. Musculoskeletal: Moderate degenerative changes of the thoracic spine. No focal osseous lesions. CT ABDOMEN PELVIS FINDINGS Hepatobiliary: Liver is within normal limits. No suspicious/enhancing hepatic lesions. Layering small gallstones (series 3/image 66), without associated inflammatory changes. No intrahepatic or extrahepatic duct dilatation. Pancreas: Within normal limits. Spleen: Within normal limits. Adrenals/Urinary Tract: Adrenal glands are within normal limits. Multiple simple bilateral renal cysts, measuring up to 14.8 cm in the left upper kidney (series 3/image 68), benign (Bosniak I). No follow-up is recommended. 2.5 x 2.6 cm enhancing lesion in the anterior right upper kidney (series 3/image 70), grossly unchanged from prior MRI, compatible  with solid renal neoplasm such as renal cell carcinoma. No hydronephrosis. Bladder is underdistended but unremarkable. Stomach/Bowel: Stomach is notable for a tiny hiatal hernia. No evidence of bowel obstruction. Normal appendix (series 3/image 98). Sigmoid diverticulosis, without evidence of diverticulitis. Vascular/Lymphatic: No evidence of abdominal aortic aneurysm. Atherosclerotic calcifications of the abdominal aorta and branch vessels. No suspicious abdominopelvic lymphadenopathy. Reproductive: Prostate is unremarkable. Other: No abdominopelvic ascites. Musculoskeletal: Mild anterior wedging at L1, chronic. Degenerative changes of the lumbar spine. No focal osseous lesions. IMPRESSION: 2.6 cm enhancing lesion in the anterior right upper kidney, grossly unchanged from prior MRI, compatible with solid renal neoplasm such as renal cell carcinoma. No findings suspicious for metastatic disease. Cholelithiasis, without associated inflammatory changes. Additional ancillary findings as above. Electronically Signed   By: SJulian HyM.D.   On: 07/31/2022 02:13   DG Chest 2 View  Result Date: 07/30/2022 CLINICAL DATA:  Chest pain EXAM: CHEST - 2 VIEW COMPARISON:  10/11/2020 FINDINGS: Cardiac silhouette enlarged. No evidence of pneumothorax or pleural effusion. Pulmonary vascular congestion. No evidence of pulmonary edema. No focal consolidation. Aorta is calcified. There are thoracic degenerative changes. IMPRESSION: Enlarged cardiac silhouette.  Pulmonary vascular congestion. Electronically Signed   By: JSammie BenchM.D.   On: 07/30/2022 17:57      IMPRESSION:    IDA.  4.5 g drop Hgb over 14 months.    NSTEMI, CAD.  Known 3 V disease with prior stenting.  Not on DAPT.  Cardiologist, Dr. SDelfina Redwood notes "angina is likely being driven by demand created by 5 point drop in Hgb..  Given possibility of acute GI bleed recommend waiting to clarify reason for (anemia) before proceeding [place] also  suggested "consider transfusion to get Hgb > 10 should anginal symptoms recur at rest".     Adenomatous colon polyps 2006, 2012. Positive cologuard 2018.  Dr DLoletha CarrowGI declined to pursue subsequent colonoscopy due to CAD. Today's echocardiogram with LVEF 60 to 65%.  Function could not be evaluated.  Normal right RV systolic function.  No significant valvular disease or regurgitation.    PAF, on Eliquis.  Last dose 1/29. Cardiology endorses holding Eliquis in setting of anemia, possible GIB    Mild AKI, resolved.      Stable R kidney mass dates back  at least 3.5 years, suspicious for cancer, followed by urology.  ? Is this contributing to anemia?      IDDM.  PLAN:       Consider treatment w Feraheme/Ferrlecit.       Clear liquids.  Switch to po Protonix 40 mg daily    H and H tonight, CBC in AM.      Dr Hilarie Fredrickson will confer w cardiology re EGD/Colonoscopy.    Given the history and exam findings on rectal exam, this is almost certainly a slow process of GI blood loss leading to her anemia.  It is probably safe to use heparin but await Dr. Vena Rua opinion.   Azucena Freed  07/31/2022, 8:21 AM Phone [phone]

## 2022-07-31 NOTE — Anesthesia Preprocedure Evaluation (Signed)
Anesthesia Evaluation  Patient identified by MRN, date of birth, ID band Patient awake    Reviewed: Allergy & Precautions, NPO status , Patient's Chart, lab work & pertinent test results  Airway Mallampati: II  TM Distance: >3 FB Neck ROM: Full    Dental no notable dental hx. (+) Teeth Intact, Dental Advisory Given   Pulmonary sleep apnea and Continuous Positive Airway Pressure Ventilation    Pulmonary exam normal breath sounds clear to auscultation       Cardiovascular hypertension, + CAD and + Past MI  Normal cardiovascular exam+ dysrhythmias Atrial Fibrillation  Rhythm:Regular Rate:Normal  07/31/2022  1. Left ventricular ejection fraction, by estimation, is 60 to 65%. The  left ventricle has normal function. The left ventricle has no regional  wall motion abnormalities. There is mild concentric left ventricular  hypertrophy. Left ventricular diastolic  function could not be evaluated.   2. Right ventricular systolic function is normal. The right ventricular  size is normal. There is mildly elevated pulmonary artery systolic  pressure. The estimated right ventricular systolic pressure is 81.1 mmHg.   3. The mitral valve is grossly normal. No evidence of mitral valve  regurgitation. No evidence of mitral stenosis.   4. The aortic valve is tricuspid. There is mild calcification of the  aortic valve. There is mild thickening of the aortic valve. Aortic valve  regurgitation is not visualized. Aortic valve sclerosis/calcification is  present, without any evidence of  aortic stenosis.   5. The inferior vena cava is normal in size with greater than 50%  respiratory variability, suggesting right atrial pressure of 3 mmHg.     Neuro/Psych    GI/Hepatic   Endo/Other  diabetesHypothyroidism    Renal/GU      Musculoskeletal  (+) Arthritis ,    Abdominal   Peds  Hematology  (+) Blood dyscrasia, anemia   Anesthesia Other  Findings All: loratidine, simvastatin  Reproductive/Obstetrics                             Anesthesia Physical Anesthesia Plan  ASA: 3  Anesthesia Plan: MAC   Post-op Pain Management: Minimal or no pain anticipated   Induction: Intravenous  PONV Risk Score and Plan: Treatment may vary due to age or medical condition  Airway Management Planned: Nasal Cannula and Natural Airway  Additional Equipment: None  Intra-op Plan:   Post-operative Plan:   Informed Consent: I have reviewed the patients History and Physical, chart, labs and discussed the procedure including the risks, benefits and alternatives for the proposed anesthesia with the patient or authorized representative who has indicated his/her understanding and acceptance.     Dental advisory given  Plan Discussed with: CRNA and Anesthesiologist  Anesthesia Plan Comments: (EGD - anemia)        Anesthesia Quick Evaluation

## 2022-07-31 NOTE — ED Provider Notes (Signed)
[facility] Provider Note   CSN: [id number] Arrival date & time: 07/30/22  1630     History  Chief Complaint  Patient presents with   Chest Pain    CORDARYL DECELLES is a 89 y.o. male.  89yo M w/ h/o extensive CAD who presents to the ed with chest pain. History obtained from him and wife. Apparently had been well until a couple weeks ago. Some DOE and generalized fatigue. The last three days had a few episodes of intermittent chest pressure. Today while working to move a washing machine around he noticed onset of chest pressure that did not resolve after resting or eating so called EMS. Has since improved. No persistent dyspnea. No associated nausea, diaphoresis or light headedness. When nting his low Hemoglobin he denies any change in stools, recent surgeries, bruising or other obvious reason for blood loss. No recent cough, fevers or other associated symptoms.    Chest Pain      Home Medications Prior to Admission medications   Medication Sig Start Date End Date Taking? Authorizing Provider  acetaminophen (TYLENOL) 325 MG tablet Take 325-650 mg by mouth every 6 (six) hours as needed for mild pain or headache.    [provider]  Alcohol Swabs (B-D SINGLE USE SWABS REGULAR) PADS Use to check blood sugar 2 times a day 03/30/21   Tower, Wynelle Fanny, MD  apixaban (ELIQUIS) 5 MG TABS tablet Take 1 tablet (5 mg total) by mouth 2 (two) times daily. 11/22/21   Lelon Perla, MD  Blood Glucose Calibration (TRUE METRIX LEVEL 1) Low SOLN Use to check control on glucose meter 03/30/21   Tower, Wynelle Fanny, MD  Blood Glucose Monitoring Suppl (TRUE METRIX AIR GLUCOSE METER) w/Device KIT 1 kit by Other route 2 (two) times daily. Check blood sugar twice daily and as directed. Dx E11.65 03/30/21   Abner Greenspan, MD  cetirizine (ZYRTEC) 10 MG tablet TAKE 1 TABLET EVERY DAY 11/14/16   Tower, Wynelle Fanny, MD  Coenzyme Q10 (CO Q 10 PO) Take 200 mg by mouth daily.     [provider]  Cyanocobalamin (VITAMIN B12) 1000 MCG TBCR Take 1,000 mcg by mouth daily. 04/12/20   Cheryln Manly, NP  DROPLET PEN NEEDLES 31G X 6 MM MISC USE ONE TIME DAILY  AT  BEDTIME  WITH  LANTUS 01/01/22   Tower, Wynelle Fanny, MD  fluticasone (FLONASE) 50 MCG/ACT nasal spray Place 1 spray into both nostrils daily as needed for allergies.     [provider]  glipiZIDE (GLUCOTROL) 10 MG tablet TAKE 1 TABLET TWICE DAILY WITH MEALS 12/12/21   Tower, [place] A, MD  insulin glargine (LANTUS SOLOSTAR) 100 UNIT/ML Solostar Pen Inject 50 Units into the skin at bedtime. Patient taking differently: Inject 51 Units into the skin at bedtime. 10/11/21   Tower, Wynelle Fanny, MD  isosorbide mononitrate (IMDUR) 60 MG 24 hr tablet TAKE 1 TABLET EVERY DAY 02/05/22   Lelon Perla, MD  metFORMIN (GLUCOPHAGE) 1000 MG tablet TAKE 1 TABLET TWICE DAILY WITH MEALS 10/25/21   Tower, Wynelle Fanny, MD  metoprolol tartrate (LOPRESSOR) 25 MG tablet Take 0.5 tablets (12.5 mg total) by mouth 2 (two) times daily. 12/12/21   Lelon Perla, MD  nitroGLYCERIN (NITROSTAT) 0.4 MG SL tablet Place 1 tablet (0.4 mg total) under the tongue every 5 (five) minutes as needed for chest pain. 05/11/19   [facility], Tami Lin, PA  NOVOLOG FLEXPEN 100  UNIT/ML FlexPen Inject 16 Units into the skin. Before dinner 09/14/21   [provider]  rosuvastatin (CRESTOR) 40 MG tablet Take 1 tablet (40 mg total) by mouth daily. 01/11/22 07/10/22  Lelon Perla, MD  TRUE METRIX BLOOD GLUCOSE TEST test strip CHECK BLOOD SUGAR TWICE DAILY 01/26/22   Tower, Wynelle Fanny, MD  TRUEplus Lancets 30G MISC Check blood sugar twice daily and as directed. Dx E11.65 03/30/21   Abner Greenspan, MD      Allergies    Loratadine and Simvastatin    Review of Systems   Review of Systems  Cardiovascular:  Positive for chest pain.    Physical Exam Updated Vital Signs BP (!) 160/75   Pulse 66   Temp 98.4 F (36.9 C) (Oral)   Resp 18   SpO2 100%   Physical Exam Vitals and nursing note reviewed.  Constitutional:      Appearance: He is well-developed.  HENT:     Head: Normocephalic and atraumatic.  Cardiovascular:     Rate and Rhythm: Normal rate.  Pulmonary:     Effort: Pulmonary effort is normal. No respiratory distress.  Abdominal:     General: There is no distension.     Palpations: Abdomen is soft.  Musculoskeletal:        General: Normal range of motion.     Cervical back: Normal range of motion.  Skin:    General: Skin is warm and dry.     Coloration: Skin is not pale.     Findings: No erythema.  Neurological:     Mental Status: He is alert.     ED Results / Procedures / Treatments   Labs (all labs ordered are listed, but only abnormal results are displayed) Labs Reviewed  BASIC METABOLIC PANEL - Abnormal; Notable for the following components:      Result Value   Glucose, Bld 321 (*)    Creatinine, Ser 1.49 (*)    Calcium 8.4 (*)    GFR, Estimated 45 (*)    All other components within normal limits  CBC WITH DIFFERENTIAL/PLATELET - Abnormal; Notable for the following components:   RBC 3.50 (*)    Hemoglobin 8.7 (*)    HCT 29.6 (*)    MCH 24.9 (*)    MCHC 29.4 (*)    RDW 15.8 (*)    All other components within normal limits  IRON AND TIBC - Abnormal; Notable for the following components:   Iron 25 (*)    Saturation Ratios 6 (*)    All other components within normal limits  FERRITIN - Abnormal; Notable for the following components:   Ferritin 6 (*)    All other components within normal limits  RETICULOCYTES - Abnormal; Notable for the following components:   RBC. 3.14 (*)    Immature Retic Fract 17.1 (*)    All other components within normal limits  CBC - Abnormal; Notable for the following components:   RBC 3.23 (*)    Hemoglobin 8.2 (*)    HCT 27.0 (*)    MCH 25.4 (*)    RDW 15.8 (*)    All other components within normal limits  TROPONIN I (HIGH SENSITIVITY) - Abnormal; Notable for the  following components:   Troponin I (High Sensitivity) 93 (*)    All other components within normal limits  TROPONIN I (HIGH SENSITIVITY) - Abnormal; Notable for the following components:   Troponin I (High Sensitivity) 425 (*)    All other components  within normal limits  TROPONIN I (HIGH SENSITIVITY) - Abnormal; Notable for the following components:   Troponin I (High Sensitivity) 651 (*)    All other components within normal limits  VITAMIN B12  FOLATE  LACTIC ACID, PLASMA  HEMOGLOBIN A1C  COMPREHENSIVE METABOLIC PANEL  MAGNESIUM  PHOSPHORUS  HEMOGLOBIN AND HEMATOCRIT, BLOOD  HEMOGLOBIN AND HEMATOCRIT, BLOOD  HEMOGLOBIN AND HEMATOCRIT, BLOOD  HEMOGLOBIN AND HEMATOCRIT, BLOOD  LIPID PANEL  TYPE AND SCREEN  ABO/RH    EKG None  Radiology CT CHEST ABDOMEN PELVIS W CONTRAST  Result Date: 07/31/2022 CLINICAL DATA:  Chest pain, known right renal mass, evaluate for metastatic disease EXAM: CT CHEST, ABDOMEN, AND PELVIS WITH CONTRAST TECHNIQUE: Multidetector CT imaging of the chest, abdomen and pelvis was performed following the standard protocol during bolus administration of intravenous contrast. RADIATION DOSE REDUCTION: This exam was performed according to the departmental dose-optimization program which includes automated exposure control, adjustment of the mA and/or kV according to patient size and/or use of iterative reconstruction technique. CONTRAST:  46m OMNIPAQUE IOHEXOL 350 MG/ML SOLN COMPARISON:  MRI abdomen dated 08/14/2021. CTA chest dated 10/12/2020. FINDINGS: CT CHEST FINDINGS Cardiovascular: Heart is normal in size.  No pericardial effusion. No evidence of thoracic aortic aneurysm. Atherosclerotic calcifications of the aortic arch. Severe three-vessel coronary atherosclerosis. Mediastinum/Nodes: 10 mm short axis right azygoesophageal recess node, previously 12 mm. Additional calcified right perihilar nodes. No suspicious mediastinal lymphadenopathy. Visualized thyroid is  unremarkable. Lungs/Pleura: No suspicious pulmonary nodules. Calcified granuloma in the right lower lobe, benign. Mild subpleural reticulation/scarring in the lungs bilaterally, lower lobe predominant. No pleural effusion or pneumothorax. Musculoskeletal: Moderate degenerative changes of the thoracic spine. No focal osseous lesions. CT ABDOMEN PELVIS FINDINGS Hepatobiliary: Liver is within normal limits. No suspicious/enhancing hepatic lesions. Layering small gallstones (series 3/image 66), without associated inflammatory changes. No intrahepatic or extrahepatic duct dilatation. Pancreas: Within normal limits. Spleen: Within normal limits. Adrenals/Urinary Tract: Adrenal glands are within normal limits. Multiple simple bilateral renal cysts, measuring up to 14.8 cm in the left upper kidney (series 3/image 68), benign (Bosniak I). No follow-up is recommended. 2.5 x 2.6 cm enhancing lesion in the anterior right upper kidney (series 3/image 70), grossly unchanged from prior MRI, compatible with solid renal neoplasm such as renal cell carcinoma. No hydronephrosis. Bladder is underdistended but unremarkable. Stomach/Bowel: Stomach is notable for a tiny hiatal hernia. No evidence of bowel obstruction. Normal appendix (series 3/image 98). Sigmoid diverticulosis, without evidence of diverticulitis. Vascular/Lymphatic: No evidence of abdominal aortic aneurysm. Atherosclerotic calcifications of the abdominal aorta and branch vessels. No suspicious abdominopelvic lymphadenopathy. Reproductive: Prostate is unremarkable. Other: No abdominopelvic ascites. Musculoskeletal: Mild anterior wedging at L1, chronic. Degenerative changes of the lumbar spine. No focal osseous lesions. IMPRESSION: 2.6 cm enhancing lesion in the anterior right upper kidney, grossly unchanged from prior MRI, compatible with solid renal neoplasm such as renal cell carcinoma. No findings suspicious for metastatic disease. Cholelithiasis, without associated  inflammatory changes. Additional ancillary findings as above. Electronically Signed   By: SJulian HyM.D.   On: 07/31/2022 02:13   DG Chest 2 View  Result Date: 07/30/2022 CLINICAL DATA:  Chest pain EXAM: CHEST - 2 VIEW COMPARISON:  10/11/2020 FINDINGS: Cardiac silhouette enlarged. No evidence of pneumothorax or pleural effusion. Pulmonary vascular congestion. No evidence of pulmonary edema. No focal consolidation. Aorta is calcified. There are thoracic degenerative changes. IMPRESSION: Enlarged cardiac silhouette.  Pulmonary vascular congestion. Electronically Signed   By: JSammie BenchM.D.   On: 07/30/2022 17:57  Procedures .Critical Care  Performed by: Merrily Pew, MD Authorized by: Merrily Pew, MD   Critical care provider statement:    Critical care time (minutes):  30   Critical care was necessary to treat or prevent imminent or life-threatening deterioration of the following conditions:  Cardiac failure and circulatory failure   Critical care was time spent personally by me on the following activities:  Development of treatment plan with patient or surrogate, discussions with consultants, evaluation of patient's response to treatment, examination of patient, ordering and review of laboratory studies, ordering and review of radiographic studies, ordering and performing treatments and interventions, pulse oximetry, re-evaluation of patient's condition and review of old charts     Medications Ordered in ED Medications  insulin aspart (novoLOG) injection 0-15 Units (has no administration in time range)  pantoprazole (PROTONIX) injection 40 mg (has no administration in time range)  isosorbide mononitrate (IMDUR) 24 hr tablet 30 mg (has no administration in time range)  metoprolol tartrate (LOPRESSOR) tablet 12.5 mg (has no administration in time range)  rosuvastatin (CRESTOR) tablet 40 mg (has no administration in time range)  iohexol (OMNIPAQUE) 350 MG/ML injection 80 mL  (80 mLs Intravenous Contrast Given 07/31/22 0206)    ED Course/ Medical Decision Making/ A&P                             Medical Decision Making Amount and/or Complexity of Data Reviewed Labs: ordered. Radiology: ordered.  Risk Prescription drug management. Decision regarding hospitalization.   Differential diagnosis to be quite broad with his presenting complaints however by time he came back here he already knew that his troponin had gone up and his hemoglobin was low.  No obvious history of GI bleeding.  Anemia panel added on.  CT scan to evaluate for this renal cell carcinoma in his kidney getting worse.  Discussed with cardiologist, Dr. Bettina Gavia, about the unstable angina with elevated troponin which would be an NSTEMI.  She thinks is related to his anemia.  She does not think he needs to be seen by cardiologist at this point.  I discussed that there is no reason for him recently to have become anemic and did not make a lot of sense for troponin just to now be going up from anemia however she states that if if hospitalist wants cardiology to see him that they could call later.  Obviously because of the anemia no indication for heparin right now.  I discussed the hospitalist who will see for admission.  CXR done and showed no obvious abnormalities (independently viewed and interpreted by myself and radiology read reviewed). CT CAP done and showed no obvious source of bleeding, retroperitoneal bleed or free air (independently viewed and interpreted by myself and radiology read reviewed).   Final Clinical Impression(s) / ED Diagnoses Final diagnoses:  NSTEMI (non-ST elevated myocardial infarction) ([place])  Anemia, unspecified type    Rx / DC Orders ED Discharge Orders          Ordered    Brain natriuretic peptide        07/31/22 0134    Protime-INR        07/31/22 0134    Type and screen       Comments: [place]    07/31/22 0134    Lactic Acid, Plasma         07/31/22 0134  Nakiah Osgood, [name], MD 07/31/22 [phone]

## 2022-07-31 NOTE — H&P (Addendum)
History and Physical  Ryan Garcia OJJ:[id number] DOB: June 15, 1934 DOA: 07/30/2022  Referring physician: Dr. Dayna Barker, EDP  PCP: Ryan Greenspan, MD  Outpatient Specialists: Cardiology. Patient coming from: Home via EMS.  Chief Complaint: Chest pain  HPI: Ryan Garcia is a 89 y.o. male with medical history significant for triple-vessel coronary artery disease status post PCI with stent placement, paroxysmal A-fib on Eliquis, chronic diastolic CHF, hypertension, hyperlipidemia, OSA on CPAP, right renal mass diagnosed 2 years ago, followed by urology, who presented to [facility] ED from home via EMS due to exertional chest pain while working in his basement yesterday.  He describes his chest pain as squeezing-like, 3-4 out of 10, nonradiating, similar to prior chest pain that led to stent placement.  At rest, the pain did not resolve completely.  He took a total of 3 sublingual nitroglycerin prior to seeing some improvement.  EMS was activated.  He received 4 baby aspirin prior to arrival to the ED.  In the ED, his first twelve-lead EKG did not show evidence of acute ischemia however his 1st set of high-sensitivity troponin was positive 93 and up-trended to 425.  His lab studies were also notable for drop in hemoglobin 8.7 from his baseline 13.1.  He denies any overt bleeding.  No hematochezia or melena.  No hematemesis or gross hematuria.  No epigastric pain.  Given the possibility of GI bleed, home Eliquis was held.  Started IV Protonix 40 mg twice daily x 4 doses.  Iron studies revealing severe iron deficiency.  CT chest abdomen and pelvis with contrast, no evidence of active bleeding however showed 2.5 x 2.6 cm enhancing lesion in the anterior right upper kidney grossly unchanged from prior MRI compatible with solid renal neoplasm such as renal cell carcinoma.  EDP discussed the case with cardiology who will follow along in consultation due to concern for NSTEMI.  TRH, hospitalist service, was asked to  admit.  At the time of this visit, the patient was chest pain-free.  No dyspnea while at rest.  No palpitations.  ED Course: Tmax 98.5.  BP 160/75, pulse 66, respiratory 18, O2 saturation 100% on room air.  Lab studies remarkable for serum glucose 321, creatinine 1.49 from baseline of 1.1, GFR 45.  Troponin 93, 425, 651.  Review of Systems: Review of systems as noted in the HPI. All other systems reviewed and are negative.   Past Medical History:  Diagnosis Date   AC (acromioclavicular) joint bone spurs    lt ankle   Allergy    allergic rhinitis   Anginal pain (HCC)    Arthritis    OA   BPH (benign prostatic hyperplasia)    microwave tx of prostate   CAD (coronary artery disease)    a. s/p cath in 2012 showing 70-75% LAD stenosis, 75% D1 stenosis, 75% OM1, 60-70% RCA stenosis, and 80% PDA --> medical therapy pursued b. similar findings by cath in 2016; 06/2017 DES to distal RCA   Chronic kidney disease    Chronic upper back pain    Diabetes mellitus    type II x8 years   GERD (gastroesophageal reflux disease)    HLD (hyperlipidemia)    10/12: TC 208, TG 213, HDL 36, LDL 129   Hx of skin cancer, basal cell    many skin cancers removed/under constant treatment.   Hypertension    NSTEMI (non-ST elevated myocardial infarction) ([place]) 06/18/2017   DES to distal RCA   Obstructive sleep apnea on CPAP  Paroxysmal atrial flutter ([place])    Renal mass    Scoliosis    Past Surgical History:  Procedure Laterality Date   BREAST SURGERY  2009   breast lump removed benign   BUBBLE STUDY  10/14/2020   Procedure: BUBBLE STUDY;  Surgeon: [name], MD;  Location: [place];  Service: Cardiovascular;;   CARDIAC CATHETERIZATION N/A 03/15/2015   Procedure: Left Heart Cath and Coronary Angiography;  Surgeon: Belva Crome, MD;  Location: [place] [facility];  Service: Cardiovascular;  Laterality: N/A;   CARDIOVERSION N/A 10/14/2020   Procedure: CARDIOVERSION;  Surgeon: [name], MD;  Location: [facility];  Service: Cardiovascular;  Laterality: N/A;   CATARACT EXTRACTION W/PHACO Right 11/29/2021   Procedure: CATARACT EXTRACTION PHACO AND INTRAOCULAR LENS PLACEMENT ([place]) RIGHT DIABETIC;  Surgeon: Leandrew Koyanagi, MD;  Location: [place];  Service: Ophthalmology;  Laterality: Right;  Diabetic 18.72 01:54.3   CORONARY STENT INTERVENTION N/A 06/18/2017   Procedure: CORONARY STENT INTERVENTION;  Surgeon: Jettie Booze, MD;  Location: [place] [facility];  Service: Cardiovascular;  Laterality: N/A;  RCA   LEFT HEART CATH AND CORONARY ANGIOGRAPHY N/A 06/18/2017   Procedure: LEFT HEART CATH AND CORONARY ANGIOGRAPHY;  Surgeon: Jettie Booze, MD;  Location: [place] [facility];  Service: Cardiovascular;  Laterality: N/A;   LEFT HEART CATH AND CORONARY ANGIOGRAPHY N/A 04/12/2020   Procedure: LEFT HEART CATH AND CORONARY ANGIOGRAPHY;  Surgeon: Martinique, Peter M, MD;  Location: [place] [facility];  Service: Cardiovascular;  Laterality: N/A;   TEE WITHOUT CARDIOVERSION N/A 10/14/2020   Procedure: TRANSESOPHAGEAL ECHOCARDIOGRAM (TEE);  Surgeon: [name], MD;  Location: [place];  Service: Cardiovascular;  Laterality: N/A;   ULTRASOUND GUIDANCE FOR VASCULAR ACCESS  06/18/2017   Procedure: Ultrasound Guidance For Vascular Access;  Surgeon: Jettie Booze, MD;  Location: [place] [facility];  Service: Cardiovascular;;  right radial, right femoral    Social History:  reports that he has never smoked. He has never been exposed to tobacco smoke. He has never used smokeless tobacco. He reports that he does not drink alcohol and does not use drugs.   Allergies  Allergen Reactions   Loratadine Other (See Comments)    Not effective   Simvastatin Other (See Comments)    Intolerant- caused joint pain    Family History  Problem Relation Age of Onset   Heart attack Brother    Colon cancer Neg Hx    Colon polyps Neg Hx    Stomach  cancer Neg Hx    Esophageal cancer Neg Hx       Prior to Admission medications   Medication Sig Start Date End Date Taking? Authorizing Provider  acetaminophen (TYLENOL) 325 MG tablet Take 325-650 mg by mouth every 6 (six) hours as needed for mild pain or headache.    [provider]  Alcohol Swabs (B-D SINGLE USE SWABS REGULAR) PADS Use to check blood sugar 2 times a day 03/30/21   Tower, Wynelle Fanny, MD  apixaban (ELIQUIS) 5 MG TABS tablet Take 1 tablet (5 mg total) by mouth 2 (two) times daily. 11/22/21   Lelon Perla, MD  Blood Glucose Calibration (TRUE METRIX LEVEL 1) Low SOLN Use to check control on glucose meter 03/30/21   Tower, Wynelle Fanny, MD  Blood Glucose Monitoring Suppl (TRUE METRIX AIR GLUCOSE METER) w/Device KIT 1 kit by Other route 2 (two) times daily. Check blood sugar twice daily and as directed. Dx E11.65  03/30/21   Tower, Wynelle Fanny, MD  cetirizine (ZYRTEC) 10 MG tablet TAKE 1 TABLET EVERY DAY 11/14/16   Tower, Wynelle Fanny, MD  Coenzyme Q10 (CO Q 10 PO) Take 200 mg by mouth daily.    [provider]  Cyanocobalamin (VITAMIN B12) 1000 MCG TBCR Take 1,000 mcg by mouth daily. 04/12/20   Cheryln Manly, NP  DROPLET PEN NEEDLES 31G X 6 MM MISC USE ONE TIME DAILY  AT  BEDTIME  WITH  LANTUS 01/01/22   Tower, Wynelle Fanny, MD  fluticasone (FLONASE) 50 MCG/ACT nasal spray Place 1 spray into both nostrils daily as needed for allergies.     [provider]  glipiZIDE (GLUCOTROL) 10 MG tablet TAKE 1 TABLET TWICE DAILY WITH MEALS 12/12/21   Tower, Purdy A, MD  insulin glargine (LANTUS SOLOSTAR) 100 UNIT/ML Solostar Pen Inject 50 Units into the skin at bedtime. Patient taking differently: Inject 51 Units into the skin at bedtime. 10/11/21   Tower, Wynelle Fanny, MD  isosorbide mononitrate (IMDUR) 60 MG 24 hr tablet TAKE 1 TABLET EVERY DAY 02/05/22   Lelon Perla, MD  metFORMIN (GLUCOPHAGE) 1000 MG tablet TAKE 1 TABLET TWICE DAILY WITH MEALS 10/25/21   Tower, Wynelle Fanny, MD  metoprolol  tartrate (LOPRESSOR) 25 MG tablet Take 0.5 tablets (12.5 mg total) by mouth 2 (two) times daily. 12/12/21   Lelon Perla, MD  nitroGLYCERIN (NITROSTAT) 0.4 MG SL tablet Place 1 tablet (0.4 mg total) under the tongue every 5 (five) minutes as needed for chest pain. 05/11/19   [facility], Tami Lin, PA  NOVOLOG FLEXPEN 100 UNIT/ML FlexPen Inject 16 Units into the skin. Before dinner 09/14/21   [provider]  rosuvastatin (CRESTOR) 40 MG tablet Take 1 tablet (40 mg total) by mouth daily. 01/11/22 07/10/22  Lelon Perla, MD  TRUE METRIX BLOOD GLUCOSE TEST test strip CHECK BLOOD SUGAR TWICE DAILY 01/26/22   Tower, Wynelle Fanny, MD  TRUEplus Lancets 30G MISC Check blood sugar twice daily and as directed. Dx E11.65 03/30/21   Ryan Greenspan, MD    Physical Exam: BP (!) 160/75   Pulse 66   Temp 98.4 F (36.9 C) (Oral)   Resp 18   SpO2 100%   General: 89 y.o. year-old male well developed well nourished in no acute distress.  Alert and oriented x3.  Hard of hearing, wears hearing aids. Cardiovascular: Regular rate and rhythm with no rubs or gallops.  No thyromegaly or JVD noted.  No lower extremity edema. 2/4 pulses in all 4 extremities. Respiratory: Clear to auscultation with no wheezes or rales. Good inspiratory effort. Abdomen: Soft nontender nondistended with normal bowel sounds x4 quadrants. Muskuloskeletal: No cyanosis, clubbing or edema noted bilaterally Neuro: CN II-XII intact, strength, sensation, reflexes Skin: No ulcerative lesions noted or rashes Psychiatry: Judgement and insight appear normal. Mood is appropriate for condition and setting          Labs on Admission:  Basic Metabolic Panel: Recent Labs  Lab 07/30/22 1655  NA 139  K 4.5  CL 107  CO2 23  GLUCOSE 321*  BUN 23  CREATININE 1.49*  CALCIUM 8.4*   Liver Function Tests: No results for input(s): "AST", "ALT", "ALKPHOS", "BILITOT", "PROT", "ALBUMIN" in the last 168 hours. No results for input(s): "LIPASE",  "AMYLASE" in the last 168 hours. No results for input(s): "AMMONIA" in the last 168 hours. CBC: Recent Labs  Lab 07/30/22 1655  WBC 6.8  NEUTROABS 5.2  HGB 8.7*  HCT 29.6*  MCV 84.6  PLT 205   Cardiac Enzymes: No results for input(s): "CKTOTAL", "CKMB", "CKMBINDEX", "TROPONINI" in the last 168 hours.  BNP (last 3 results) No results for input(s): "BNP" in the last 8760 hours.  ProBNP (last 3 results) No results for input(s): "PROBNP" in the last 8760 hours.  CBG: No results for input(s): "GLUCAP" in the last 168 hours.  Radiological Exams on Admission: CT CHEST ABDOMEN PELVIS W CONTRAST  Result Date: 07/31/2022 CLINICAL DATA:  Chest pain, known right renal mass, evaluate for metastatic disease EXAM: CT CHEST, ABDOMEN, AND PELVIS WITH CONTRAST TECHNIQUE: Multidetector CT imaging of the chest, abdomen and pelvis was performed following the standard protocol during bolus administration of intravenous contrast. RADIATION DOSE REDUCTION: This exam was performed according to the departmental dose-optimization program which includes automated exposure control, adjustment of the mA and/or kV according to patient size and/or use of iterative reconstruction technique. CONTRAST:  24m OMNIPAQUE IOHEXOL 350 MG/ML SOLN COMPARISON:  MRI abdomen dated 08/14/2021. CTA chest dated 10/12/2020. FINDINGS: CT CHEST FINDINGS Cardiovascular: Heart is normal in size.  No pericardial effusion. No evidence of thoracic aortic aneurysm. Atherosclerotic calcifications of the aortic arch. Severe three-vessel coronary atherosclerosis. Mediastinum/Nodes: 10 mm short axis right azygoesophageal recess node, previously 12 mm. Additional calcified right perihilar nodes. No suspicious mediastinal lymphadenopathy. Visualized thyroid is unremarkable. Lungs/Pleura: No suspicious pulmonary nodules. Calcified granuloma in the right lower lobe, benign. Mild subpleural reticulation/scarring in the lungs bilaterally, lower lobe  predominant. No pleural effusion or pneumothorax. Musculoskeletal: Moderate degenerative changes of the thoracic spine. No focal osseous lesions. CT ABDOMEN PELVIS FINDINGS Hepatobiliary: Liver is within normal limits. No suspicious/enhancing hepatic lesions. Layering small gallstones (series 3/image 66), without associated inflammatory changes. No intrahepatic or extrahepatic duct dilatation. Pancreas: Within normal limits. Spleen: Within normal limits. Adrenals/Urinary Tract: Adrenal glands are within normal limits. Multiple simple bilateral renal cysts, measuring up to 14.8 cm in the left upper kidney (series 3/image 68), benign (Bosniak I). No follow-up is recommended. 2.5 x 2.6 cm enhancing lesion in the anterior right upper kidney (series 3/image 70), grossly unchanged from prior MRI, compatible with solid renal neoplasm such as renal cell carcinoma. No hydronephrosis. Bladder is underdistended but unremarkable. Stomach/Bowel: Stomach is notable for a tiny hiatal hernia. No evidence of bowel obstruction. Normal appendix (series 3/image 98). Sigmoid diverticulosis, without evidence of diverticulitis. Vascular/Lymphatic: No evidence of abdominal aortic aneurysm. Atherosclerotic calcifications of the abdominal aorta and branch vessels. No suspicious abdominopelvic lymphadenopathy. Reproductive: Prostate is unremarkable. Other: No abdominopelvic ascites. Musculoskeletal: Mild anterior wedging at L1, chronic. Degenerative changes of the lumbar spine. No focal osseous lesions. IMPRESSION: 2.6 cm enhancing lesion in the anterior right upper kidney, grossly unchanged from prior MRI, compatible with solid renal neoplasm such as renal cell carcinoma. No findings suspicious for metastatic disease. Cholelithiasis, without associated inflammatory changes. Additional ancillary findings as above. Electronically Signed   By: SJulian HyM.D.   On: 07/31/2022 02:13   DG Chest 2 View  Result Date: 07/30/2022 CLINICAL  DATA:  Chest pain EXAM: CHEST - 2 VIEW COMPARISON:  10/11/2020 FINDINGS: Cardiac silhouette enlarged. No evidence of pneumothorax or pleural effusion. Pulmonary vascular congestion. No evidence of pulmonary edema. No focal consolidation. Aorta is calcified. There are thoracic degenerative changes. IMPRESSION: Enlarged cardiac silhouette.  Pulmonary vascular congestion. Electronically Signed   By: JSammie BenchM.D.   On: 07/30/2022 17:57    EKG: I independently viewed the EKG done and my findings are as followed: A-fib  with PVCs, rate of 63, nonspecific ST-T changes.  QTc 433.  Assessment/Plan Present on Admission:  NSTEMI (non-ST elevated myocardial infarction) [facility])  Principal Problem:   NSTEMI (non-ST elevated myocardial infarction) ([place])  NSTEMI History of triple-vessel coronary artery disease status post PCI with stent placement. Home aspirin was discontinued on 11/23/2021 by outpatient cardiologist given need for Eliquis. Chest pain similar to prior that led to stent placement. Received a full dose aspirin prior to arrival to the ED. Resume home Crestor 40 mg daily High-sensitivity troponin uptrending with high delta: 93, 425, 651. Home Eliquis held, started heparin drip 2D echo, fasting lipid panel, A1c ordered Serial twelve-lead EKG x2 Cardiology consulted  Severe iron deficiency anemia Acute blood loss anemia, unclear source, no overt bleeding. Hemoglobin at baseline 13.1 Presented with hemoglobin of 8.7 with severe iron deficiency on iron studies done 07/31/2022. Has a right renal mass which is suspicious for renal cell carcinoma. Denies any overt bleeding Started IV Protonix 40 mg twice daily x 4 doses as GI prophylaxis. Closely monitor H&H while on heparin drip Serial H&H every 6 hours x 4. Will need iron infusion prior to discharge Transfuse PRBCs as indicated and as recommended by cardiology. [facility] consulted, Dr. Bryan Lemma, via secure chat.  Right renal mass  concerning for renal cell carcinoma Could contribute to anemia. Follows with urology outpatient  AKI, suspect prerenal from dehydration Baseline creatinine 1.1 with GFR greater than 60 Presented with creatinine 1.49 with GFR 45. Avoid nephrotoxic agents, dehydration and hypotension. Monitor urine output with strict I's and O's.  Type 2 diabetes with hyperglycemia Obtain hemoglobin A1c Started insulin sliding scale every 4 hours while NPO.  Paroxysmal A-fib on Eliquis Hold off home Eliquis, started heparin drip. Closely monitor H&H while on heparin drip. No overt bleeding reported. Rate controlled on home low-dose p.o. Lopressor 12.5 mg twice daily. Monitor on telemetry.  Chronic diastolic CHF Euvolemic on exam Resume home Imdur, dose reduced to 30 mg daily to avoid hypotension Start strict I's and O's and daily weight  OSA Home CPAP nightly  Hypertension, BP is not at goal Resume home Imdur and metoprolol tartrate. Closely monitor vital signs Avoid hypotension.  Maintain MAP greater than 65.  Obesity Last weight 116 kg Recommend weight loss outpatient with regular physical activity and healthy dieting.    Critical care time: 65 minutes.    DVT prophylaxis: Heparin drip.  Code Status: Full code  Family Communication: Updated his wife at bedside.  Disposition Plan: Admitted to progressive care unit  Consults called: Cardiology, GI.  Admission status: Inpatient status.   Status is: Inpatient The patient requires at least 2 midnights for further evaluation and treatment of present condition.   Kayleen Memos MD [facility] Pager [phone]  If 7PM-7AM, please contact night-coverage [email or web address] Password [email or web address]  07/31/2022, 2:25 AM

## 2022-07-31 NOTE — Consult Note (Signed)
Cardiology Consultation   Patient ID: Ryan Garcia MRN: [id number]; DOB: 04-Nov-1933  Admit date: 07/30/2022 Date of Consult: 07/31/2022  PCP:  Abner Greenspan, MD   [facility] Cardiologist:  Kirk Ruths, MD        Patient Profile:   Ryan Garcia is a 89 y.o. male with a hx of 3v CAD being medically managed, PAF on [place] who is being seen 07/31/2022 for the evaluation of CP and abn HST at the request of Dr Dayna Barker [facility]).  History of Present Illness:   Ryan Garcia has h/o 3vCAD with both prox and distal lesions, medically managed due to pt's preference to avoid CABG and h/o relatively few limiting anginal sx, h/o PAF on chronic eliquis, CKD, HLD, HTN who presented to the ED after an episode of anginal chest discomfort while engaging in strenuous exertion. The pain improved but did not completely subside w/ SL NTG x 2, so EMS called to transport pt to ED. He says he was on the fence about coming to the ED at all, as his sx were "not that severe", but since they did not resolve completely with 2 NTG, he decided to come on in for evaluation. Sx did ultimately resolve completely prior to arrival at the ED.  EKG upon arrival w/o acute ischemic changes. HS trop found to be elevated at 425. Of note, pt's Hgb also found to have dropped 5 points from last check, down to 8.7 from 13.1 in march 2023. In addition, his Cr is up to 1.49 from baseline of ~1.2. Pt says he has felt a little weak and more SOB than normal over the past several days, but today's incident was the first episode of CP that he has had in quite some time. His prior angina manifested as a chest discomfort very similar to what he had this evening, just more significant/severe in the past when he has had prior PCI than the sx he experienced today. The ED is currently engaged in working up the cause for the drop in Hgb, so the etiology of the acute anemia is not yet clear. He has been compliant w/ his eliquis and has  not noticed any clear signs or symptoms of overt blood loss.  Past Medical History:  Diagnosis Date   AC (acromioclavicular) joint bone spurs    lt ankle   Allergy    allergic rhinitis   Anginal pain (HCC)    Arthritis    OA   BPH (benign prostatic hyperplasia)    microwave tx of prostate   CAD (coronary artery disease)    a. s/p cath in 2012 showing 70-75% LAD stenosis, 75% D1 stenosis, 75% OM1, 60-70% RCA stenosis, and 80% PDA --> medical therapy pursued b. similar findings by cath in 2016; 06/2017 DES to distal RCA   Chronic kidney disease    Chronic upper back pain    Diabetes mellitus    type II x8 years   GERD (gastroesophageal reflux disease)    HLD (hyperlipidemia)    10/12: TC 208, TG 213, HDL 36, LDL 129   Hx of skin cancer, basal cell    many skin cancers removed/under constant treatment.   Hypertension    NSTEMI (non-ST elevated myocardial infarction) ([place]) 06/18/2017   DES to distal RCA   Obstructive sleep apnea on CPAP    Paroxysmal atrial flutter ([place])    Renal mass    Scoliosis     Past Surgical History:  Procedure  Laterality Date   BREAST SURGERY  2009   breast lump removed benign   BUBBLE STUDY  10/14/2020   Procedure: BUBBLE STUDY;  Surgeon: [name], MD;  Location: [place];  Service: Cardiovascular;;   CARDIAC CATHETERIZATION N/A 03/15/2015   Procedure: Left Heart Cath and Coronary Angiography;  Surgeon: Belva Crome, MD;  Location: [place] [facility];  Service: Cardiovascular;  Laterality: N/A;   CARDIOVERSION N/A 10/14/2020   Procedure: CARDIOVERSION;  Surgeon: [name], MD;  Location[facility] ENDOSCOPY;  Service: Cardiovascular;  Laterality: N/A;   CATARACT EXTRACTION W/PHACO Right 11/29/2021   Procedure: CATARACT EXTRACTION PHACO AND INTRAOCULAR LENS PLACEMENT ([place]) RIGHT DIABETIC;  Surgeon: Leandrew Koyanagi, MD;  Location: [place];  Service: Ophthalmology;  Laterality: Right;  Diabetic 18.72 01:54.3   CORONARY  STENT INTERVENTION N/A 06/18/2017   Procedure: CORONARY STENT INTERVENTION;  Surgeon: Jettie Booze, MD;  Location: [place] [facility];  Service: Cardiovascular;  Laterality: N/A;  RCA   LEFT HEART CATH AND CORONARY ANGIOGRAPHY N/A 06/18/2017   Procedure: LEFT HEART CATH AND CORONARY ANGIOGRAPHY;  Surgeon: Jettie Booze, MD;  Location: [place] [facility];  Service: Cardiovascular;  Laterality: N/A;   LEFT HEART CATH AND CORONARY ANGIOGRAPHY N/A 04/12/2020   Procedure: LEFT HEART CATH AND CORONARY ANGIOGRAPHY;  Surgeon: Martinique, Peter M, MD;  Location: [place] [facility];  Service: Cardiovascular;  Laterality: N/A;   TEE WITHOUT CARDIOVERSION N/A 10/14/2020   Procedure: TRANSESOPHAGEAL ECHOCARDIOGRAM (TEE);  Surgeon: [name], MD;  Location: [place];  Service: Cardiovascular;  Laterality: N/A;   ULTRASOUND GUIDANCE FOR VASCULAR ACCESS  06/18/2017   Procedure: Ultrasound Guidance For Vascular Access;  Surgeon: Jettie Booze, MD;  Location: [place] [facility];  Service: Cardiovascular;;  right radial, right femoral     Home Medications:  Prior to Admission medications   Medication Sig Start Date End Date Taking? Authorizing Provider  acetaminophen (TYLENOL) 325 MG tablet Take 325-650 mg by mouth every 6 (six) hours as needed for mild pain or headache.    [provider]  Alcohol Swabs (B-D SINGLE USE SWABS REGULAR) PADS Use to check blood sugar 2 times a day 03/30/21   Tower, Wynelle Fanny, MD  apixaban (ELIQUIS) 5 MG TABS tablet Take 1 tablet (5 mg total) by mouth 2 (two) times daily. 11/22/21   Lelon Perla, MD  Blood Glucose Calibration (TRUE METRIX LEVEL 1) Low SOLN Use to check control on glucose meter 03/30/21   Tower, Wynelle Fanny, MD  Blood Glucose Monitoring Suppl (TRUE METRIX AIR GLUCOSE METER) w/Device KIT 1 kit by Other route 2 (two) times daily. Check blood sugar twice daily and as directed. Dx E11.65 03/30/21   Abner Greenspan, MD  cetirizine (ZYRTEC) 10  MG tablet TAKE 1 TABLET EVERY DAY 11/14/16   Tower, Wynelle Fanny, MD  Coenzyme Q10 (CO Q 10 PO) Take 200 mg by mouth daily.    [provider]  Cyanocobalamin (VITAMIN B12) 1000 MCG TBCR Take 1,000 mcg by mouth daily. 04/12/20   Cheryln Manly, NP  DROPLET PEN NEEDLES 31G X 6 MM MISC USE ONE TIME DAILY  AT  BEDTIME  WITH  LANTUS 01/01/22   Tower, Wynelle Fanny, MD  fluticasone (FLONASE) 50 MCG/ACT nasal spray Place 1 spray into both nostrils daily as needed for allergies.     [provider]  glipiZIDE (GLUCOTROL) 10 MG tablet TAKE 1 TABLET TWICE DAILY WITH MEALS 12/12/21  Tower, Marne A, MD  insulin glargine (LANTUS SOLOSTAR) 100 UNIT/ML Solostar Pen Inject 50 Units into the skin at bedtime. Patient taking differently: Inject 51 Units into the skin at bedtime. 10/11/21   Tower, Wynelle Fanny, MD  isosorbide mononitrate (IMDUR) 60 MG 24 hr tablet TAKE 1 TABLET EVERY DAY 02/05/22   Lelon Perla, MD  metFORMIN (GLUCOPHAGE) 1000 MG tablet TAKE 1 TABLET TWICE DAILY WITH MEALS 10/25/21   Tower, Wynelle Fanny, MD  metoprolol tartrate (LOPRESSOR) 25 MG tablet Take 0.5 tablets (12.5 mg total) by mouth 2 (two) times daily. 12/12/21   Lelon Perla, MD  nitroGLYCERIN (NITROSTAT) 0.4 MG SL tablet Place 1 tablet (0.4 mg total) under the tongue every 5 (five) minutes as needed for chest pain. 05/11/19   [facility], Tami Lin, PA  NOVOLOG FLEXPEN 100 UNIT/ML FlexPen Inject 16 Units into the skin. Before dinner 09/14/21   [provider]  rosuvastatin (CRESTOR) 40 MG tablet Take 1 tablet (40 mg total) by mouth daily. 01/11/22 07/10/22  Lelon Perla, MD  TRUE METRIX BLOOD GLUCOSE TEST test strip CHECK BLOOD SUGAR TWICE DAILY 01/26/22   Tower, Wynelle Fanny, MD  TRUEplus Lancets 30G MISC Check blood sugar twice daily and as directed. Dx E11.65 03/30/21   Abner Greenspan, MD    Inpatient Medications: Scheduled Meds:  Continuous Infusions:  PRN Meds:   Allergies:    Allergies  Allergen Reactions   Loratadine  Other (See Comments)    Not effective   Simvastatin Other (See Comments)    Intolerant- caused joint pain    Social History:   Social History   Socioeconomic History   Marital status: Married    Spouse name: Judah Carchi   Number of children: 3   Years of education: Not on file   Highest education level: Not on file  Occupational History   Not on file  Tobacco Use   Smoking status: Never    Passive exposure: Never   Smokeless tobacco: Never  Vaping Use   Vaping Use: Never used  Substance and Sexual Activity   Alcohol use: No    Alcohol/week: 0.0 standard drinks of alcohol   Drug use: No   Sexual activity: Not Currently  Other Topics Concern   Not on file  Social History Narrative   Married    Physically active hard of hearing   Social Determinants of Health   Financial Resource Strain: Low Risk  (06/29/2021)   Overall Financial Resource Strain (CARDIA)    Difficulty of Paying Living Expenses: Not hard at all  Food Insecurity: No Food Insecurity (06/29/2021)   Hunger Vital Sign    Worried About Running Out of Food in the Last Year: Never true    [place] in the Last Year: Never true  Transportation Needs: No Transportation Needs (06/29/2021)   PRAPARE - Hydrologist (Medical): No    Lack of Transportation (Non-Medical): No  Physical Activity: Inactive (06/29/2021)   Exercise Vital Sign    Days of Exercise per Week: 0 days    Minutes of Exercise per Session: 0 min  Stress: No Stress Concern Present (06/29/2021)   [place]    Feeling of Stress : Not at all  Social Connections: Moderately Integrated (06/29/2021)   Social Connection and Isolation Panel [NHANES]    Frequency of Communication with Friends and Family: More than three times a week  Frequency of Social Gatherings with Friends and Family: More than three times a week    Attends Religious Services:  More than 4 times per year    Active Member of [employer] or Organizations: No    Attends Archivist Meetings: Never    Marital Status: Married  Human resources officer Violence: Not At Risk (06/29/2021)   Humiliation, Afraid, Rape, and Kick questionnaire    Fear of Current or Ex-Partner: No    Emotionally Abused: No    Physically Abused: No    Sexually Abused: No    Family History:    Family History  Problem Relation Age of Onset   Heart attack Brother    Colon cancer Neg Hx    Colon polyps Neg Hx    Stomach cancer Neg Hx    Esophageal cancer Neg Hx      ROS:  Please see the history of present illness.   All other ROS reviewed and negative.     Physical Exam/Data:   Vitals:   07/30/22 1648 07/30/22 1829 07/31/22 0121  BP: (!) 158/67 (!) 160/71 (!) 160/75  Pulse: 71 73 66  Resp: '18 17 18  '$ Temp: 98.5 F (36.9 C) 98.1 F (36.7 C) 98.4 F (36.9 C)  TempSrc: Oral  Oral  SpO2: 100% 100% 100%   No intake or output data in the 24 hours ending 07/31/22 0229    07/05/2022   10:45 AM 07/03/2022   10:38 AM 05/09/2022   10:05 AM  Last 3 Weights  Weight (lbs) 257 lb 8 oz 253 lb 253 lb 12.8 oz  Weight (kg) 116.801 kg 114.76 kg 115.123 kg     There is no height or weight on file to calculate BMI.  General:  Well nourished, well developed, in no acute distress HEENT: normal Neck: no JVD Vascular: No carotid bruits Cardiac:  normal S1, S2; RRR, occas ectopy; no murmur  Lungs:  clear to auscultation bilaterally, no wheezing, rhonchi or rales  Abd: soft, nontender, no hepatomegaly  Ext: tr-1+ bilateral edema Musculoskeletal:  No deformities Skin: warm and dry  Neuro:  no focal abnormalities noted Psych:  Normal affect   EKG:  The EKG was personally reviewed and demonstrates:  NSR with frequent PACs, PVC, possible 2nd deg AVB type I with variation in PR interval, no acute ischemic changes Telemetry:  Telemetry was personally reviewed and demonstrates:  NSR with  ectopy  Relevant CV Studies: TTE 10-12-20 1. Left ventricular ejection fraction, by estimation, is 55 to 60%. The  left ventricle has normal function. The left ventricle demonstrates  regional wall motion abnormalities. Mild basal inferior hypokinesis based  on limited views consistent with prior  TTE. There is severe concentric left ventricular hypertrophy. LV diastolic  function is indeterminant due to atrial fibrilltion.   2. Right ventricular systolic function is normal. The right ventricular  size is normal. Tricuspid regurgitation signal is inadequate for assessing  PA pressure.   3. The mitral valve is normal in structure. Trivial mitral valve  regurgitation. No evidence of mitral stenosis.   4. The aortic valve is tricuspid. Aortic valve regurgitation is not  visualized. No aortic stenosis is present.   5. The inferior vena cava is normal in size with greater than 50%  respiratory variability, suggesting right atrial pressure of 3 mmHg.   LHC 04-12-20 Prox LAD lesion is 85% stenosed. Ost 1st Diag to 1st Diag lesion is 85% stenosed. Dist LAD lesion is 65% stenosed. Prox LAD to Mid  LAD lesion is 75% stenosed. Ost Cx to Mid Cx lesion is 80% stenosed. Ost 1st Mrg to 1st Mrg lesion is 95% stenosed. Mid Cx to Dist Cx lesion is 65% stenosed. 2nd Mrg lesion is 75% stenosed. RPDA lesion is 60% stenosed. 1st RPL lesion is 70% stenosed. Prox RCA to Mid RCA lesion is 30% stenosed. Previously placed Dist RCA drug eluting stent is widely patent. Balloon angioplasty was performed. LV end diastolic pressure is normal.   1. Severe 2 vessel obstructive CAD 2. Patent stent in the RCA 3. Normal LVEDP   Plan: the severe disease in the left coronary distribution is unchanged from prior. No suitable targets for PCI. Recommend continued medical therapy. If refractory angina would need to consider CABG.  Laboratory Data:  High Sensitivity Troponin:   Recent Labs  Lab 07/30/22 1655  07/30/22 1956  TROPONINIHS 93* 425*     Chemistry Recent Labs  Lab 07/30/22 1655  NA 139  K 4.5  CL 107  CO2 23  GLUCOSE 321*  BUN 23  CREATININE 1.49*  CALCIUM 8.4*  GFRNONAA 45*  ANIONGAP 9    No results for input(s): "PROT", "ALBUMIN", "AST", "ALT", "ALKPHOS", "BILITOT" in the last 168 hours. Lipids No results for input(s): "CHOL", "TRIG", "HDL", "LABVLDL", "LDLCALC", "CHOLHDL" in the last 168 hours.  Hematology Recent Labs  Lab 07/30/22 1655  WBC 6.8  RBC 3.50*  HGB 8.7*  HCT 29.6*  MCV 84.6  MCH 24.9*  MCHC 29.4*  RDW 15.8*  PLT 205   Thyroid No results for input(s): "TSH", "FREET4" in the last 168 hours.  BNPNo results for input(s): "BNP", "PROBNP" in the last 168 hours.  DDimer No results for input(s): "DDIMER" in the last 168 hours.   Radiology/Studies:  CT CHEST ABDOMEN PELVIS W CONTRAST  Result Date: 07/31/2022 CLINICAL DATA:  Chest pain, known right renal mass, evaluate for metastatic disease EXAM: CT CHEST, ABDOMEN, AND PELVIS WITH CONTRAST TECHNIQUE: Multidetector CT imaging of the chest, abdomen and pelvis was performed following the standard protocol during bolus administration of intravenous contrast. RADIATION DOSE REDUCTION: This exam was performed according to the departmental dose-optimization program which includes automated exposure control, adjustment of the mA and/or kV according to patient size and/or use of iterative reconstruction technique. CONTRAST:  4m OMNIPAQUE IOHEXOL 350 MG/ML SOLN COMPARISON:  MRI abdomen dated 08/14/2021. CTA chest dated 10/12/2020. FINDINGS: CT CHEST FINDINGS Cardiovascular: Heart is normal in size.  No pericardial effusion. No evidence of thoracic aortic aneurysm. Atherosclerotic calcifications of the aortic arch. Severe three-vessel coronary atherosclerosis. Mediastinum/Nodes: 10 mm short axis right azygoesophageal recess node, previously 12 mm. Additional calcified right perihilar nodes. No suspicious mediastinal  lymphadenopathy. Visualized thyroid is unremarkable. Lungs/Pleura: No suspicious pulmonary nodules. Calcified granuloma in the right lower lobe, benign. Mild subpleural reticulation/scarring in the lungs bilaterally, lower lobe predominant. No pleural effusion or pneumothorax. Musculoskeletal: Moderate degenerative changes of the thoracic spine. No focal osseous lesions. CT ABDOMEN PELVIS FINDINGS Hepatobiliary: Liver is within normal limits. No suspicious/enhancing hepatic lesions. Layering small gallstones (series 3/image 66), without associated inflammatory changes. No intrahepatic or extrahepatic duct dilatation. Pancreas: Within normal limits. Spleen: Within normal limits. Adrenals/Urinary Tract: Adrenal glands are within normal limits. Multiple simple bilateral renal cysts, measuring up to 14.8 cm in the left upper kidney (series 3/image 68), benign (Bosniak I). No follow-up is recommended. 2.5 x 2.6 cm enhancing lesion in the anterior right upper kidney (series 3/image 70), grossly unchanged from prior MRI, compatible with solid renal neoplasm such  as renal cell carcinoma. No hydronephrosis. Bladder is underdistended but unremarkable. Stomach/Bowel: Stomach is notable for a tiny hiatal hernia. No evidence of bowel obstruction. Normal appendix (series 3/image 98). Sigmoid diverticulosis, without evidence of diverticulitis. Vascular/Lymphatic: No evidence of abdominal aortic aneurysm. Atherosclerotic calcifications of the abdominal aorta and branch vessels. No suspicious abdominopelvic lymphadenopathy. Reproductive: Prostate is unremarkable. Other: No abdominopelvic ascites. Musculoskeletal: Mild anterior wedging at L1, chronic. Degenerative changes of the lumbar spine. No focal osseous lesions. IMPRESSION: 2.6 cm enhancing lesion in the anterior right upper kidney, grossly unchanged from prior MRI, compatible with solid renal neoplasm such as renal cell carcinoma. No findings suspicious for metastatic disease.  Cholelithiasis, without associated inflammatory changes. Additional ancillary findings as above. Electronically Signed   By: Julian Hy M.D.   On: 07/31/2022 02:13   DG Chest 2 View  Result Date: 07/30/2022 CLINICAL DATA:  Chest pain EXAM: CHEST - 2 VIEW COMPARISON:  10/11/2020 FINDINGS: Cardiac silhouette enlarged. No evidence of pneumothorax or pleural effusion. Pulmonary vascular congestion. No evidence of pulmonary edema. No focal consolidation. Aorta is calcified. There are thoracic degenerative changes. IMPRESSION: Enlarged cardiac silhouette.  Pulmonary vascular congestion. Electronically Signed   By: Sammie Bench M.D.   On: 07/30/2022 17:57     Assessment and Plan:   CAD/NSTEMI: pt has known 3V CAD with both proximal and distal lesions on his 2 prior LHC including 85% pLAD/75% mLAD/65% dLAD, 85% ostial D1, 80% ostial Lcx/95% OM1/75%OM2, 60% RPDA/70% RPL. He has had overall infrequent and insignificant anginal sx at baseline over the past several years, and thus his disease has been medically managed with the thought that CABG should be considered should his sx escalate. At this point, his angina is likely being driven by demand created by the 5-point drop in Hgb, the cause for which is unknown. LHC could be considered to reevaluate for any significant lesion that might be amenable to PCI; however given the possibility of acute GIB, would recommend waiting to further clarify the reason for his drop in Hgb before proceeding with LHC and possible PCI. He is currently pain-free. Would consider transfusion of PRBC to get Hgb>10 should anginal sx recur at rest. Cont baseline anti-anginal therapy w/ imdur 60. PAF: currently in NSR with frequent ectopy. Hold eliquis given possibility of acute GIB and 5-point drop in Hgb ARF: Cr currently above baseline--mgmt as per primary team   Risk Assessment/Risk Scores:     TIMI Risk Score for Unstable Angina or Non-ST Elevation MI:   The patient's  TIMI risk score is 5, which indicates a 26% risk of all cause mortality, new or recurrent myocardial infarction or need for urgent revascularization in the next 14 days.        For questions or updates, please contact [place] Please consult [email or web address] for contact info under    Signed, Rudean Curt, MD, [facility]  07/31/2022 2:29 AM

## 2022-07-31 NOTE — ED Notes (Signed)
Breakfast tray ordered 

## 2022-07-31 NOTE — Progress Notes (Addendum)
Rounding Note    Patient Name: Ryan Garcia Date of Encounter: 07/31/2022  [place] Cardiologist: Kirk Ruths, MD   Subjective   No chest pain this morning. Denies any overt GI bleeding prior to presentation.   Inpatient Medications    Scheduled Meds:  insulin aspart  0-15 Units Subcutaneous Q4H   isosorbide mononitrate  30 mg Oral Daily   metoprolol tartrate  12.5 mg Oral BID   [START ON 08/01/2022] pantoprazole  40 mg Oral Q0600   rosuvastatin  40 mg Oral Daily   Continuous Infusions:  PRN Meds: acetaminophen, melatonin, prochlorperazine   Vital Signs    Vitals:   07/31/22 0730 07/31/22 0830 07/31/22 0831 07/31/22 0833  BP: (!) 151/68 (!) 167/69    Pulse: 69 (!) 129 64 63  Resp: '17 12 12 17  '$ Temp:      TempSrc:      SpO2: 100% 100% 100% 100%    Intake/Output Summary (Last 24 hours) at 07/31/2022 0952 Last data filed at 07/31/2022 0947 Gross per 24 hour  Intake --  Output 900 ml  Net -900 ml      07/05/2022   10:45 AM 07/03/2022   10:38 AM 05/09/2022   10:05 AM  Last 3 Weights  Weight (lbs) 257 lb 8 oz 253 lb 253 lb 12.8 oz  Weight (kg) 116.801 kg 114.76 kg 115.123 kg      Telemetry    Sinus bradycardia, 1st degree AVB, 2nd degree AVB type I Personally Reviewed  ECG    Sinus bradycardia, 2nd degree AVB type I?, PVCs - Personally Reviewed  Physical Exam   GEN: No acute distress.   Neck: No JVD Cardiac: RRR, no murmurs, rubs, or gallops.  Respiratory: Clear to auscultation bilaterally. GI: Soft, nontender, non-distended  MS: No edema; No deformity. Neuro:  Nonfocal  Psych: Normal affect   Labs    High Sensitivity Troponin:   Recent Labs  Lab 07/30/22 1655 07/30/22 1956 07/31/22 0130 07/31/22 0447  TROPONINIHS 93* 425* 651* 609*     Chemistry Recent Labs  Lab 07/30/22 1655 07/31/22 0336  NA 139 140  K 4.5 4.0  CL 107 106  CO2 23 24  GLUCOSE 321* 274*  BUN 23 22  CREATININE 1.49* 1.14  CALCIUM 8.4* 8.7*  MG   --  2.1  PROT  --  5.8*  ALBUMIN  --  3.1*  AST  --  23  ALT  --  16  ALKPHOS  --  75  BILITOT  --  0.4  GFRNONAA 45* >60  ANIONGAP 9 10    Lipids  Recent Labs  Lab 07/31/22 0447  CHOL 86  TRIG 95  HDL 29*  LDLCALC 38  CHOLHDL 3.0    Hematology Recent Labs  Lab 07/30/22 1655 07/31/22 0220 07/31/22 0336 07/31/22 0447  WBC 6.8  --  6.0  --   RBC 3.50* 3.14* 3.23*  --   HGB 8.7*  --  8.2* 8.6*  HCT 29.6*  --  27.0* 28.4*  MCV 84.6  --  83.6  --   MCH 24.9*  --  25.4*  --   MCHC 29.4*  --  30.4  --   RDW 15.8*  --  15.8*  --   PLT 205  --  178  --    Thyroid No results for input(s): "TSH", "FREET4" in the last 168 hours.  BNP Recent Labs  Lab 07/31/22 0220  BNP 122.8*  DDimer No results for input(s): "DDIMER" in the last 168 hours.   Radiology    ECHOCARDIOGRAM COMPLETE  Result Date: 07/31/2022    ECHOCARDIOGRAM REPORT   Patient Name:   Ryan Garcia Date of Exam: 07/31/2022 Medical Rec #:  [id number]     Height:       71.0 in Accession #:    [id number]    Weight:       257.5 lb Date of Birth:  1934-02-04     BSA:          2.348 m Patient Age:    89 years      BP:           151/68 mmHg Patient Gender: M             HR:           64 bpm. Exam Location:  Inpatient Procedure: 2D Echo, Cardiac Doppler and Color Doppler                       STAT ECHO Reported to: Dr Eleonore Chiquito on 07/31/2022 8:06:00 AM. Indications:    NSTEMI  History:        Patient has prior history of Echocardiogram examinations, most                 recent 10/14/2020. Previous Myocardial Infarction,                 Arrythmias:Atrial Fibrillation; Risk Factors:Diabetes,                 Hypertension and Sleep Apnea.  Sonographer:    Clayton Lefort RDCS (AE) Referring Phys: [id number] [place]  1. Left ventricular ejection fraction, by estimation, is 60 to 65%. The left ventricle has normal function. The left ventricle has no regional wall motion abnormalities. There is mild concentric left  ventricular hypertrophy. Left ventricular diastolic function could not be evaluated.  2. Right ventricular systolic function is normal. The right ventricular size is normal. There is mildly elevated pulmonary artery systolic pressure. The estimated right ventricular systolic pressure is 30.0 mmHg.  3. The mitral valve is grossly normal. No evidence of mitral valve regurgitation. No evidence of mitral stenosis.  4. The aortic valve is tricuspid. There is mild calcification of the aortic valve. There is mild thickening of the aortic valve. Aortic valve regurgitation is not visualized. Aortic valve sclerosis/calcification is present, without any evidence of aortic stenosis.  5. The inferior vena cava is normal in size with greater than 50% respiratory variability, suggesting right atrial pressure of 3 mmHg. Comparison(s): No significant change from prior study. FINDINGS  Left Ventricle: Left ventricular ejection fraction, by estimation, is 60 to 65%. The left ventricle has normal function. The left ventricle has no regional wall motion abnormalities. The left ventricular internal cavity size was normal in size. There is  mild concentric left ventricular hypertrophy. Left ventricular diastolic function could not be evaluated due to atrial fibrillation. Left ventricular diastolic function could not be evaluated. Right Ventricle: The right ventricular size is normal. No increase in right ventricular wall thickness. Right ventricular systolic function is normal. There is mildly elevated pulmonary artery systolic pressure. The tricuspid regurgitant velocity is 2.90  m/s, and with an assumed right atrial pressure of 3 mmHg, the estimated right ventricular systolic pressure is 92.3 mmHg. Left Atrium: Left atrial size was normal in size. Right Atrium: Right atrial size was normal in size. Pericardium:  There is no evidence of pericardial effusion. Mitral Valve: The mitral valve is grossly normal. No evidence of mitral valve  regurgitation. No evidence of mitral valve stenosis. Tricuspid Valve: The tricuspid valve is grossly normal. Tricuspid valve regurgitation is mild . No evidence of tricuspid stenosis. Aortic Valve: The aortic valve is tricuspid. There is mild calcification of the aortic valve. There is mild thickening of the aortic valve. Aortic valve regurgitation is not visualized. Aortic valve sclerosis/calcification is present, without any evidence of aortic stenosis. Aortic valve mean gradient measures 5.0 mmHg. Aortic valve peak gradient measures 9.7 mmHg. Aortic valve area, by VTI measures 2.75 cm. Pulmonic Valve: The pulmonic valve was grossly normal. Pulmonic valve regurgitation is not visualized. No evidence of pulmonic stenosis. Aorta: The aortic root and ascending aorta are structurally normal, with no evidence of dilitation. Venous: The inferior vena cava is normal in size with greater than 50% respiratory variability, suggesting right atrial pressure of 3 mmHg. IAS/Shunts: The atrial septum is grossly normal.  LEFT VENTRICLE PLAX 2D LVIDd:         4.80 cm LVIDs:         3.10 cm LV PW:         1.10 cm LV IVS:        1.20 cm LVOT diam:     2.10 cm LV SV:         93 LV SV Index:   40 LVOT Area:     3.46 cm  RIGHT VENTRICLE             IVC RV Basal diam:  3.20 cm     IVC diam: 1.40 cm RV S prime:     13.90 cm/s TAPSE (M-mode): 2.7 cm LEFT ATRIUM              Index        RIGHT ATRIUM           Index LA diam:        3.90 cm  1.66 cm/m   RA Area:     21.60 cm LA Vol (A2C):   104.0 ml 44.30 ml/m  RA Volume:   59.20 ml  25.22 ml/m LA Vol (A4C):   70.2 ml  29.90 ml/m LA Biplane Vol: 88.7 ml  37.78 ml/m  AORTIC VALVE AV Area (Vmax):    2.86 cm AV Area (Vmean):   2.80 cm AV Area (VTI):     2.75 cm AV Vmax:           156.00 cm/s AV Vmean:          105.000 cm/s AV VTI:            0.338 m AV Peak Grad:      9.7 mmHg AV Mean Grad:      5.0 mmHg LVOT Vmax:         129.00 cm/s LVOT Vmean:        84.900 cm/s LVOT VTI:           0.268 m LVOT/AV VTI ratio: 0.79  AORTA Ao Root diam: 3.40 cm Ao Asc diam:  3.60 cm TRICUSPID VALVE TR Peak grad:   33.6 mmHg TR Vmax:        290.00 cm/s  SHUNTS Systemic VTI:  0.27 m Systemic Diam: 2.10 cm Eleonore Chiquito MD Electronically signed by Eleonore Chiquito MD Signature Date/Time: 07/31/2022/8:20:37 AM    Final    CT CHEST ABDOMEN PELVIS W CONTRAST  Result Date: 07/31/2022 CLINICAL  DATA:  Chest pain, known right renal mass, evaluate for metastatic disease EXAM: CT CHEST, ABDOMEN, AND PELVIS WITH CONTRAST TECHNIQUE: Multidetector CT imaging of the chest, abdomen and pelvis was performed following the standard protocol during bolus administration of intravenous contrast. RADIATION DOSE REDUCTION: This exam was performed according to the departmental dose-optimization program which includes automated exposure control, adjustment of the mA and/or kV according to patient size and/or use of iterative reconstruction technique. CONTRAST:  42m OMNIPAQUE IOHEXOL 350 MG/ML SOLN COMPARISON:  MRI abdomen dated 08/14/2021. CTA chest dated 10/12/2020. FINDINGS: CT CHEST FINDINGS Cardiovascular: Heart is normal in size.  No pericardial effusion. No evidence of thoracic aortic aneurysm. Atherosclerotic calcifications of the aortic arch. Severe three-vessel coronary atherosclerosis. Mediastinum/Nodes: 10 mm short axis right azygoesophageal recess node, previously 12 mm. Additional calcified right perihilar nodes. No suspicious mediastinal lymphadenopathy. Visualized thyroid is unremarkable. Lungs/Pleura: No suspicious pulmonary nodules. Calcified granuloma in the right lower lobe, benign. Mild subpleural reticulation/scarring in the lungs bilaterally, lower lobe predominant. No pleural effusion or pneumothorax. Musculoskeletal: Moderate degenerative changes of the thoracic spine. No focal osseous lesions. CT ABDOMEN PELVIS FINDINGS Hepatobiliary: Liver is within normal limits. No suspicious/enhancing hepatic lesions.  Layering small gallstones (series 3/image 66), without associated inflammatory changes. No intrahepatic or extrahepatic duct dilatation. Pancreas: Within normal limits. Spleen: Within normal limits. Adrenals/Urinary Tract: Adrenal glands are within normal limits. Multiple simple bilateral renal cysts, measuring up to 14.8 cm in the left upper kidney (series 3/image 68), benign (Bosniak I). No follow-up is recommended. 2.5 x 2.6 cm enhancing lesion in the anterior right upper kidney (series 3/image 70), grossly unchanged from prior MRI, compatible with solid renal neoplasm such as renal cell carcinoma. No hydronephrosis. Bladder is underdistended but unremarkable. Stomach/Bowel: Stomach is notable for a tiny hiatal hernia. No evidence of bowel obstruction. Normal appendix (series 3/image 98). Sigmoid diverticulosis, without evidence of diverticulitis. Vascular/Lymphatic: No evidence of abdominal aortic aneurysm. Atherosclerotic calcifications of the abdominal aorta and branch vessels. No suspicious abdominopelvic lymphadenopathy. Reproductive: Prostate is unremarkable. Other: No abdominopelvic ascites. Musculoskeletal: Mild anterior wedging at L1, chronic. Degenerative changes of the lumbar spine. No focal osseous lesions. IMPRESSION: 2.6 cm enhancing lesion in the anterior right upper kidney, grossly unchanged from prior MRI, compatible with solid renal neoplasm such as renal cell carcinoma. No findings suspicious for metastatic disease. Cholelithiasis, without associated inflammatory changes. Additional ancillary findings as above. Electronically Signed   By: SJulian HyM.D.   On: 07/31/2022 02:13   DG Chest 2 View  Result Date: 07/30/2022 CLINICAL DATA:  Chest pain EXAM: CHEST - 2 VIEW COMPARISON:  10/11/2020 FINDINGS: Cardiac silhouette enlarged. No evidence of pneumothorax or pleural effusion. Pulmonary vascular congestion. No evidence of pulmonary edema. No focal consolidation. Aorta is calcified.  There are thoracic degenerative changes. IMPRESSION: Enlarged cardiac silhouette.  Pulmonary vascular congestion. Electronically Signed   By: JSammie BenchM.D.   On: 07/30/2022 17:57    Cardiac Studies   Echo: 07/31/2022  IMPRESSIONS     1. Left ventricular ejection fraction, by estimation, is 60 to 65%. The  left ventricle has normal function. The left ventricle has no regional  wall motion abnormalities. There is mild concentric left ventricular  hypertrophy. Left ventricular diastolic  function could not be evaluated.   2. Right ventricular systolic function is normal. The right ventricular  size is normal. There is mildly elevated pulmonary artery systolic  pressure. The estimated right ventricular systolic pressure is 394.8mmHg.   3. The mitral valve  is grossly normal. No evidence of mitral valve  regurgitation. No evidence of mitral stenosis.   4. The aortic valve is tricuspid. There is mild calcification of the  aortic valve. There is mild thickening of the aortic valve. Aortic valve  regurgitation is not visualized. Aortic valve sclerosis/calcification is  present, without any evidence of  aortic stenosis.   5. The inferior vena cava is normal in size with greater than 50%  respiratory variability, suggesting right atrial pressure of 3 mmHg.   Comparison(s): No significant change from prior study.   FINDINGS   Left Ventricle: Left ventricular ejection fraction, by estimation, is 60  to 65%. The left ventricle has normal function. The left ventricle has no  regional wall motion abnormalities. The left ventricular internal cavity  size was normal in size. There is   mild concentric left ventricular hypertrophy. Left ventricular diastolic  function could not be evaluated due to atrial fibrillation. Left  ventricular diastolic function could not be evaluated.   Right Ventricle: The right ventricular size is normal. No increase in  right ventricular wall thickness. Right  ventricular systolic function is  normal. There is mildly elevated pulmonary artery systolic pressure. The  tricuspid regurgitant velocity is 2.90   m/s, and with an assumed right atrial pressure of 3 mmHg, the estimated  right ventricular systolic pressure is 02.6 mmHg.   Left Atrium: Left atrial size was normal in size.   Right Atrium: Right atrial size was normal in size.   Pericardium: There is no evidence of pericardial effusion.   Mitral Valve: The mitral valve is grossly normal. No evidence of mitral  valve regurgitation. No evidence of mitral valve stenosis.   Tricuspid Valve: The tricuspid valve is grossly normal. Tricuspid valve  regurgitation is mild . No evidence of tricuspid stenosis.   Aortic Valve: The aortic valve is tricuspid. There is mild calcification  of the aortic valve. There is mild thickening of the aortic valve. Aortic  valve regurgitation is not visualized. Aortic valve  sclerosis/calcification is present, without any  evidence of aortic stenosis. Aortic valve mean gradient measures 5.0 mmHg.  Aortic valve peak gradient measures 9.7 mmHg. Aortic valve area, by VTI  measures 2.75 cm.   Pulmonic Valve: The pulmonic valve was grossly normal. Pulmonic valve  regurgitation is not visualized. No evidence of pulmonic stenosis.   Aorta: The aortic root and ascending aorta are structurally normal, with  no evidence of dilitation.   Venous: The inferior vena cava is normal in size with greater than 50%  respiratory variability, suggesting right atrial pressure of 3 mmHg.   IAS/Shunts: The atrial septum is grossly normal.    Patient Profile     89 y.o. male with PMH of 3vCAD, paroxsymal Afib, HTN, HLD who presented with chest pain and found to have elevated troponin and anemia.   Assessment & Plan    NSTEMI -- presented with chest pain, several episode yesterday. hsTn 93>>425>>651>>609 flat trend. EKG without significant ischemia. Currently pain free.  Suspect this may be demand ischemia in the setting of anemia. Known CAD on prior cath with 85% pLAD/75% mLAD/65% dLAD, 85% ostial D1, 80% ostial Lcx/95% OM1/75%OM2, 60% RPDA/70% RPL. Echo this morning with LVEF of 60-65%, no rWMA noted. Given his acute anemia, would not pursue invasive work up at this time. Defer IV heparin for now -- continue metoprolol, statin, Imdur  IDA -- presented with dyspnea, noted to have drop in Hgb from 13>>8.6 over the past year. Denies  any over bleeding PTA -- seen by GI, possible EGD/colonoscopy -- on protonix, likely benefit from IV iron  Paroxsymal afib -- EKG read as afib, but review of telemetry shows sinus with 1st degree AVB with freq PACs/PVCs -- Eliquis currently held  HTN -- elevated in the ED -- continue metoprolol and Imdur. No room to increase BB with bradycardia  AKI -- Cr 1.49, improved to 1.14 this morning  For questions or updates, please contact [facility] Please consult [email or web address] for contact info under        Signed, Reino Bellis, NP  07/31/2022, 9:52 AM

## 2022-07-31 NOTE — ED Notes (Signed)
Pt given incentive spirometer, educated on use; pt verbalized and demonstrated understanding

## 2022-07-31 NOTE — ED Notes (Signed)
Pt ambulatory to and from restroom with steady gait 

## 2022-07-31 NOTE — ED Notes (Signed)
ED TO INPATIENT HANDOFF REPORT  ED Nurse Name and Phone #: [phone]  S Name/Age/Gender Ryan Garcia 89 y.o. male Room/Bed: 002C/002C  Code Status   Code Status: DNR  Home/SNF/Other Home Patient oriented to: self, place, time, and situation Is this baseline? Yes   Triage Complete: Triage complete  Chief Complaint NSTEMI (non-ST elevated myocardial infarction) ([place]) [I21.4]  Triage Note Pt from home via [facility]. Pt reports chest pain that is worse when walking. Pt reports the pain as dull. Pt has taken 324 ASA and 2 nitro with relief.    Allergies Allergies  Allergen Reactions   Loratadine Other (See Comments)    Not effective   Simvastatin Other (See Comments)    Intolerant- caused joint pain    Level of Care/Admitting Diagnosis ED Disposition     ED Disposition  Admit   Condition  --   Comment  Hospital Area: [facility] [[id number]]  Level of Care: Progressive [102]  Admit to Progressive based on following criteria: CARDIOVASCULAR & THORACIC of moderate stability with acute coronary syndrome symptoms/low risk myocardial infarction/hypertensive urgency/arrhythmias/heart failure potentially compromising stability and stable post cardiovascular intervention patients.  May admit patient to [facility] or [facility] if equivalent level of care is available:: No  Covid Evaluation: Asymptomatic - no recent exposure (last 10 days) testing not required  Diagnosis: NSTEMI (non-ST elevated myocardial infarction) [facility]) [[id number]]  Admitting Physician: Kayleen Memos [[id number]]  Attending Physician: Kayleen Memos [[id number]]  Certification:: I certify this patient will need inpatient services for at least 2 midnights  Estimated Length of Stay: 2          B Medical/Surgery History Past Medical History:  Diagnosis Date   AC (acromioclavicular) joint bone spurs    lt ankle   Allergy    allergic rhinitis   Anginal pain ([place])    Arthritis    OA   BPH (benign  prostatic hyperplasia)    microwave tx of prostate   CAD (coronary artery disease)    a. s/p cath in 2012 showing 70-75% LAD stenosis, 75% D1 stenosis, 75% OM1, 60-70% RCA stenosis, and 80% PDA --> medical therapy pursued b. similar findings by cath in 2016; 06/2017 DES to distal RCA   Chronic kidney disease    Chronic upper back pain    Diabetes mellitus    type II x8 years   GERD (gastroesophageal reflux disease)    HLD (hyperlipidemia)    10/12: TC 208, TG 213, HDL 36, LDL 129   Hx of skin cancer, basal cell    many skin cancers removed/under constant treatment.   Hypertension    NSTEMI (non-ST elevated myocardial infarction) ([place]) 06/18/2017   DES to distal RCA   Obstructive sleep apnea on CPAP    Paroxysmal atrial flutter ([place])    Renal mass    Scoliosis    Past Surgical History:  Procedure Laterality Date   BREAST SURGERY  2009   breast lump removed benign   BUBBLE STUDY  10/14/2020   Procedure: BUBBLE STUDY;  Surgeon: [name], MD;  Location: [facility];  Service: Cardiovascular;;   CARDIAC CATHETERIZATION N/A 03/15/2015   Procedure: Left Heart Cath and Coronary Angiography;  Surgeon: Belva Crome, MD;  Location: [facility];  Service: Cardiovascular;  Laterality: N/A;   CARDIOVERSION N/A 10/14/2020   Procedure: CARDIOVERSION;  Surgeon: [name], MD;  Location: [facility] ENDOSCOPY;  Service: Cardiovascular;  Laterality: N/A;   CATARACT EXTRACTION  W/PHACO Right 11/29/2021   Procedure: CATARACT EXTRACTION PHACO AND INTRAOCULAR LENS PLACEMENT ([place]) RIGHT DIABETIC;  Surgeon: Leandrew Koyanagi, MD;  Location: [facility];  Service: Ophthalmology;  Laterality: Right;  Diabetic 18.72 01:54.3   CORONARY STENT INTERVENTION N/A 06/18/2017   Procedure: CORONARY STENT INTERVENTION;  Surgeon: Jettie Booze, MD;  Location: [facility];  Service: Cardiovascular;  Laterality: N/A;  RCA   LEFT HEART CATH AND CORONARY ANGIOGRAPHY N/A 06/18/2017    Procedure: LEFT HEART CATH AND CORONARY ANGIOGRAPHY;  Surgeon: Jettie Booze, MD;  Location: [facility];  Service: Cardiovascular;  Laterality: N/A;   LEFT HEART CATH AND CORONARY ANGIOGRAPHY N/A 04/12/2020   Procedure: LEFT HEART CATH AND CORONARY ANGIOGRAPHY;  Surgeon: Martinique, Peter M, MD;  Location: [facility];  Service: Cardiovascular;  Laterality: N/A;   TEE WITHOUT CARDIOVERSION N/A 10/14/2020   Procedure: TRANSESOPHAGEAL ECHOCARDIOGRAM (TEE);  Surgeon: [name], MD;  Location: [facility];  Service: Cardiovascular;  Laterality: N/A;   ULTRASOUND GUIDANCE FOR VASCULAR ACCESS  06/18/2017   Procedure: Ultrasound Guidance For Vascular Access;  Surgeon: Jettie Booze, MD;  Location: [facility];  Service: Cardiovascular;;  right radial, right femoral     A IV Location/Drains/Wounds Patient Lines/Drains/Airways Status     Active Line/Drains/Airways     Name Placement date Placement time Site Days   Peripheral IV 07/31/22 18 G 1" Anterior;Distal;Left;Upper Arm 07/31/22  0109  Arm  less than 1   Incision (Closed) 11/29/21 Eye Right 11/29/21  0755  -- 244            Intake/Output Last 24 hours  Intake/Output Summary (Last 24 hours) at 07/31/2022 1445 Last data filed at 07/31/2022 0947 Gross per 24 hour  Intake --  Output 900 ml  Net -900 ml    Labs/Imaging Results for orders placed or performed during the hospital encounter of 07/30/22 (from the past 48 hour(s))  Basic metabolic panel     Status: Abnormal   Collection Time: 07/30/22  4:55 PM  Result Value Ref Range   Sodium 139 135 - 145 mmol/L   Potassium 4.5 3.5 - 5.1 mmol/L   Chloride 107 98 - 111 mmol/L   CO2 23 22 - 32 mmol/L   Glucose, Bld 321 (H) 70 - 99 mg/dL    Comment: Glucose reference range applies only to samples taken after fasting for at least 8 hours.   BUN 23 8 - 23 mg/dL   Creatinine, Ser 1.49 (H) 0.61 - 1.24 mg/dL   Calcium 8.4 (L) 8.9 - 10.3 mg/dL   GFR,  Estimated 45 (L) >60 mL/min    Comment: (NOTE) Calculated using the CKD-EPI Creatinine Equation (2021)    Anion gap 9 5 - 15    Comment: Performed at [facility] [address], [place], [place] [address]  Troponin I (High Sensitivity)     Status: Abnormal   Collection Time: 07/30/22  4:55 PM  Result Value Ref Range   Troponin I (High Sensitivity) 93 (H) <18 ng/L    Comment: (NOTE) Elevated high sensitivity troponin I (hsTnI) values and significant  changes across serial measurements may suggest ACS but many other  chronic and acute conditions are known to elevate hsTnI results.  Refer to the "Links" section for chest pain algorithms and additional  guidance. Performed at [facility], [place] [address] [place], [place] [address]   CBC with Differential     Status:  Abnormal   Collection Time: 07/30/22  4:55 PM  Result Value Ref Range   WBC 6.8 4.0 - 10.5 K/uL   RBC 3.50 (L) 4.22 - 5.81 MIL/uL   Hemoglobin 8.7 (L) 13.0 - 17.0 g/dL   HCT 29.6 (L) 39.0 - 52.0 %   MCV 84.6 80.0 - 100.0 fL   MCH 24.9 (L) 26.0 - 34.0 pg   MCHC 29.4 (L) 30.0 - 36.0 g/dL   RDW 15.8 (H) 11.5 - 15.5 %   Platelets 205 150 - 400 K/uL   nRBC 0.0 0.0 - 0.2 %   Neutrophils Relative % 76 %   Neutro Abs 5.2 1.7 - 7.7 K/uL   Lymphocytes Relative 12 %   Lymphs Abs 0.8 0.7 - 4.0 K/uL   Monocytes Relative 10 %   Monocytes Absolute 0.7 0.1 - 1.0 K/uL   Eosinophils Relative 2 %   Eosinophils Absolute 0.1 0.0 - 0.5 K/uL   Basophils Relative 0 %   Basophils Absolute 0.0 0.0 - 0.1 K/uL   Immature Granulocytes 0 %   Abs Immature Granulocytes 0.02 0.00 - 0.07 K/uL    Comment: Performed at [facility], [address], [place], [place] [address]  ABO/Rh     Status: None   Collection Time: 07/30/22  4:55 PM  Result Value Ref Range   ABO/RH(D)      O NEG Performed at [facility] [address] [place], [place] [address]   Troponin I (High Sensitivity)     Status: Abnormal   Collection  Time: 07/30/22  7:56 PM  Result Value Ref Range   Troponin I (High Sensitivity) 425 (HH) <18 ng/L    Comment: CRITICAL RESULT CALLED TO, READ BACK BY AND VERIFIED WITH B.SANGALANG,RN. 2126 07/30/22. LPAIT (NOTE) Elevated high sensitivity troponin I (hsTnI) values and significant  changes across serial measurements may suggest ACS but many other  chronic and acute conditions are known to elevate hsTnI results.  Refer to the "Links" section for chest pain algorithms and additional  guidance. Performed at [facility], [place] [address] [place], [place] [address]   Protime-INR     Status: Abnormal   Collection Time: 07/31/22  1:30 AM  Result Value Ref Range   Prothrombin Time 15.5 (H) 11.4 - 15.2 seconds   INR 1.2 0.8 - 1.2    Comment: (NOTE) INR goal varies based on device and disease states. Performed at [facility], [place] [address], [place], [place] [address]   Lactic Acid, Plasma     Status: None   Collection Time: 07/31/22  1:30 AM  Result Value Ref Range   Lactic Acid, Venous 1.4 0.5 - 1.9 mmol/L    Comment: Performed at [facility] [address], [place], [place] [address]  Vitamin B12     Status: None   Collection Time: 07/31/22  1:30 AM  Result Value Ref Range   Vitamin B-12 692 180 - 914 pg/mL    Comment: (NOTE) This assay is not validated for testing neonatal or myeloproliferative syndrome specimens for Vitamin B12 levels. Performed at [facility], [place] [address] [place], [place] [address]   Folate     Status: None   Collection Time: 07/31/22  1:30 AM  Result Value Ref Range   Folate 11.8 >5.9 ng/mL    Comment: Performed at [facility] [address], [place], [place] [address]  Iron and TIBC  Status: Abnormal   Collection Time: 07/31/22  1:30 AM  Result Value Ref Range   Iron 25 (L) 45 - 182 ug/dL   TIBC 447 250 - 450 ug/dL   Saturation Ratios 6 (L) 17.9 - 39.5 %   UIBC 422 ug/dL    Comment: Performed at [facility], [place] [address] [place], [place] [address]  Ferritin     Status: Abnormal   Collection Time: 07/31/22  1:30 AM  Result Value Ref Range   Ferritin 6 (L) 24 - 336 ng/mL    Comment: Performed at [facility], [place] [address], [place], [place] [address]  Troponin I (High Sensitivity)     Status: Abnormal   Collection Time: 07/31/22  1:30 AM  Result Value Ref Range   Troponin I (High Sensitivity) 651 (HH) <18 ng/L    Comment: CRITICAL VALUE NOTED. VALUE IS CONSISTENT WITH PREVIOUSLY REPORTED/CALLED VALUE (NOTE) Elevated high sensitivity troponin I (hsTnI) values and significant  changes across serial measurements may suggest ACS but many other  chronic and acute conditions are known to elevate hsTnI results.  Refer to the "Links" section for chest pain algorithms and additional  guidance. Performed at [facility], [place] [address], [place], [place] [address]   Type and screen [place]     Status: None   Collection Time: 07/31/22  1:56 AM  Result Value Ref Range   ABO/RH(D) O NEG    Antibody Screen NEG    Sample Expiration      08/03/2022,2359 Performed at [facility], [place] [address], [place], [place] [address]   Brain natriuretic peptide     Status: Abnormal   Collection Time: 07/31/22  2:20 AM  Result Value Ref Range   B Natriuretic Peptide 122.8 (H) 0.0 - 100.0 pg/mL    Comment: Performed at [facility] [address], [place], [place] [address]  Reticulocytes     Status: Abnormal   Collection Time: 07/31/22  2:20 AM  Result Value Ref Range   Retic Ct Pct 1.7 0.4 - 3.1 %   RBC. 3.14 (L) 4.22 - 5.81 MIL/uL   Retic Count, Absolute 54.6 19.0 - 186.0 K/uL   Immature Retic Fract 17.1 (H) 2.3 - 15.9 %    Comment: Performed at [facility] [address], [place], [place] [address]  Lactic acid, plasma     Status: None   Collection Time: 07/31/22  3:36 AM  Result Value Ref Range   Lactic Acid, Venous 1.2 0.5 - 1.9 mmol/L     Comment: Performed at [facility] [address] [place], [place] [address]  Comprehensive metabolic panel     Status: Abnormal   Collection Time: 07/31/22  3:36 AM  Result Value Ref Range   Sodium 140 135 - 145 mmol/L   Potassium 4.0 3.5 - 5.1 mmol/L   Chloride 106 98 - 111 mmol/L   CO2 24 22 - 32 mmol/L   Glucose, Bld 274 (H) 70 - 99 mg/dL    Comment: Glucose reference range applies only to samples taken after fasting for at least 8 hours.   BUN 22 8 - 23 mg/dL   Creatinine, Ser 1.14 0.61 - 1.24 mg/dL   Calcium 8.7 (L) 8.9 - 10.3 mg/dL   Total Protein 5.8 (L) 6.5 - 8.1 g/dL   Albumin 3.1 (L) 3.5 - 5.0 g/dL   AST 23 15 - 41 U/L  ALT 16 0 - 44 U/L   Alkaline Phosphatase 75 38 - 126 U/L   Total Bilirubin 0.4 0.3 - 1.2 mg/dL   GFR, Estimated >60 >60 mL/min    Comment: (NOTE) Calculated using the CKD-EPI Creatinine Equation (2021)    Anion gap 10 5 - 15    Comment: Performed at [facility] [address] [place], [place] [address]  CBC     Status: Abnormal   Collection Time: 07/31/22  3:36 AM  Result Value Ref Range   WBC 6.0 4.0 - 10.5 K/uL   RBC 3.23 (L) 4.22 - 5.81 MIL/uL   Hemoglobin 8.2 (L) 13.0 - 17.0 g/dL   HCT 27.0 (L) 39.0 - 52.0 %   MCV 83.6 80.0 - 100.0 fL   MCH 25.4 (L) 26.0 - 34.0 pg   MCHC 30.4 30.0 - 36.0 g/dL   RDW 15.8 (H) 11.5 - 15.5 %   Platelets 178 150 - 400 K/uL   nRBC 0.0 0.0 - 0.2 %    Comment: Performed at [facility], [place] [address], [place], [place] [address]  Magnesium     Status: None   Collection Time: 07/31/22  3:36 AM  Result Value Ref Range   Magnesium 2.1 1.7 - 2.4 mg/dL    Comment: Performed at [facility] [address], [place], [place] [address]  Phosphorus     Status: None   Collection Time: 07/31/22  3:36 AM  Result Value Ref Range   Phosphorus 3.2 2.5 - 4.6 mg/dL    Comment: Performed at [facility] [address] [place], [place] [address]  CBG monitoring, ED     Status: Abnormal    Collection Time: 07/31/22  4:04 AM  Result Value Ref Range   Glucose-Capillary 217 (H) 70 - 99 mg/dL    Comment: Glucose reference range applies only to samples taken after fasting for at least 8 hours.  Hemoglobin A1c     Status: Abnormal   Collection Time: 07/31/22  4:47 AM  Result Value Ref Range   Hgb A1c MFr Bld 7.8 (H) 4.8 - 5.6 %    Comment: (NOTE) Pre diabetes:          5.7%-6.4%  Diabetes:              >6.4%  Glycemic control for   <7.0% adults with diabetes    Mean Plasma Glucose 177.16 mg/dL    Comment: Performed at [facility] [address], [place], [place] [address]  Hemoglobin and hematocrit, blood     Status: Abnormal   Collection Time: 07/31/22  4:47 AM  Result Value Ref Range   Hemoglobin 8.6 (L) 13.0 - 17.0 g/dL   HCT 28.4 (L) 39.0 - 52.0 %    Comment: Performed at [facility] [address], [place], [place] [address]  Lipid panel     Status: Abnormal   Collection Time: 07/31/22  4:47 AM  Result Value Ref Range   Cholesterol 86 0 - 200 mg/dL   Triglycerides 95 <150 mg/dL   HDL 29 (L) >40 mg/dL   Total CHOL/HDL Ratio 3.0 RATIO   VLDL 19 0 - 40 mg/dL   LDL Cholesterol 38 0 - 99 mg/dL    Comment:        Total Cholesterol/HDL:CHD Risk Coronary Heart Disease Risk Table  Men   Women  1/2 Average Risk   3.4   3.3  Average Risk       5.0   4.4  2 X Average Risk   9.6   7.1  3 X Average Risk  23.4   11.0        Use the calculated Patient Ratio above and the CHD Risk Table to determine the patient's CHD Risk.        ATP III CLASSIFICATION (LDL):  <100     mg/dL   Optimal  100-129  mg/dL   Near or Above                    Optimal  130-159  mg/dL   Borderline  160-189  mg/dL   High  >190     mg/dL   Very High Performed at [facility] [address], [place], [place] [address]   Troponin I (High Sensitivity)     Status: Abnormal   Collection Time: 07/31/22  4:47 AM  Result Value Ref Range   Troponin I (High  Sensitivity) 609 (HH) <18 ng/L    Comment: CRITICAL VALUE NOTED. VALUE IS CONSISTENT WITH PREVIOUSLY REPORTED/CALLED VALUE (NOTE) Elevated high sensitivity troponin I (hsTnI) values and significant  changes across serial measurements may suggest ACS but many other  chronic and acute conditions are known to elevate hsTnI results.  Refer to the "Links" section for chest pain algorithms and additional  guidance. Performed at [facility], [place] [address], [place], [place] [address]   CBG monitoring, ED     Status: Abnormal   Collection Time: 07/31/22  8:08 AM  Result Value Ref Range   Glucose-Capillary 198 (H) 70 - 99 mg/dL    Comment: Glucose reference range applies only to samples taken after fasting for at least 8 hours.  CBG monitoring, ED     Status: Abnormal   Collection Time: 07/31/22 12:54 PM  Result Value Ref Range   Glucose-Capillary 261 (H) 70 - 99 mg/dL    Comment: Glucose reference range applies only to samples taken after fasting for at least 8 hours.  Urinalysis, Routine w reflex microscopic -Urine, Unspecified Source     Status: Abnormal   Collection Time: 07/31/22  1:08 PM  Result Value Ref Range   Color, Urine STRAW (A) YELLOW   APPearance CLEAR CLEAR   Specific Gravity, Urine 1.012 1.005 - 1.030   pH 6.0 5.0 - 8.0   Glucose, UA >=500 (A) NEGATIVE mg/dL   Hgb urine dipstick NEGATIVE NEGATIVE   Bilirubin Urine NEGATIVE NEGATIVE   Ketones, ur NEGATIVE NEGATIVE mg/dL   Protein, ur 30 (A) NEGATIVE mg/dL   Nitrite NEGATIVE NEGATIVE   Leukocytes,Ua NEGATIVE NEGATIVE   RBC / HPF 0-5 0 - 5 RBC/hpf   WBC, UA 0-5 0 - 5 WBC/hpf   Bacteria, UA NONE SEEN NONE SEEN   Squamous Epithelial / HPF 0-5 0 - 5 /HPF    Comment: Performed at [facility], [address] [place], [place] [address]   ECHOCARDIOGRAM COMPLETE  Result Date: 07/31/2022    ECHOCARDIOGRAM REPORT   Patient Name:   Ryan Garcia Date of Exam: 07/31/2022 Medical Rec #:  [id number]     Height:        71.0 in Accession #:    [id number]    Weight:       257.5 lb Date of Birth:  1933/10/26  BSA:          2.348 m Patient Age:    10 years      BP:           151/68 mmHg Patient Gender: M             HR:           64 bpm. Exam Location:  Inpatient Procedure: 2D Echo, Cardiac Doppler and Color Doppler                       STAT ECHO Reported to: Dr Eleonore Chiquito on 07/31/2022 8:06:00 AM. Indications:    NSTEMI  History:        Patient has prior history of Echocardiogram examinations, most                 recent 10/14/2020. Previous Myocardial Infarction,                 Arrythmias:Atrial Fibrillation; Risk Factors:Diabetes,                 Hypertension and Sleep Apnea.  Sonographer:    Clayton Lefort RDCS (AE) Referring Phys: [id number] [place]  1. Left ventricular ejection fraction, by estimation, is 60 to 65%. The left ventricle has normal function. The left ventricle has no regional wall motion abnormalities. There is mild concentric left ventricular hypertrophy. Left ventricular diastolic function could not be evaluated.  2. Right ventricular systolic function is normal. The right ventricular size is normal. There is mildly elevated pulmonary artery systolic pressure. The estimated right ventricular systolic pressure is 18.5 mmHg.  3. The mitral valve is grossly normal. No evidence of mitral valve regurgitation. No evidence of mitral stenosis.  4. The aortic valve is tricuspid. There is mild calcification of the aortic valve. There is mild thickening of the aortic valve. Aortic valve regurgitation is not visualized. Aortic valve sclerosis/calcification is present, without any evidence of aortic stenosis.  5. The inferior vena cava is normal in size with greater than 50% respiratory variability, suggesting right atrial pressure of 3 mmHg. Comparison(s): No significant change from prior study. FINDINGS  Left Ventricle: Left ventricular ejection fraction, by estimation, is 60 to 65%. The left ventricle has  normal function. The left ventricle has no regional wall motion abnormalities. The left ventricular internal cavity size was normal in size. There is  mild concentric left ventricular hypertrophy. Left ventricular diastolic function could not be evaluated due to atrial fibrillation. Left ventricular diastolic function could not be evaluated. Right Ventricle: The right ventricular size is normal. No increase in right ventricular wall thickness. Right ventricular systolic function is normal. There is mildly elevated pulmonary artery systolic pressure. The tricuspid regurgitant velocity is 2.90  m/s, and with an assumed right atrial pressure of 3 mmHg, the estimated right ventricular systolic pressure is 63.1 mmHg. Left Atrium: Left atrial size was normal in size. Right Atrium: Right atrial size was normal in size. Pericardium: There is no evidence of pericardial effusion. Mitral Valve: The mitral valve is grossly normal. No evidence of mitral valve regurgitation. No evidence of mitral valve stenosis. Tricuspid Valve: The tricuspid valve is grossly normal. Tricuspid valve regurgitation is mild . No evidence of tricuspid stenosis. Aortic Valve: The aortic valve is tricuspid. There is mild calcification of the aortic valve. There is mild thickening of the aortic valve. Aortic valve regurgitation is not visualized. Aortic valve sclerosis/calcification is present, without any evidence of aortic stenosis.  Aortic valve mean gradient measures 5.0 mmHg. Aortic valve peak gradient measures 9.7 mmHg. Aortic valve area, by VTI measures 2.75 cm. Pulmonic Valve: The pulmonic valve was grossly normal. Pulmonic valve regurgitation is not visualized. No evidence of pulmonic stenosis. Aorta: The aortic root and ascending aorta are structurally normal, with no evidence of dilitation. Venous: The inferior vena cava is normal in size with greater than 50% respiratory variability, suggesting right atrial pressure of 3 mmHg. IAS/Shunts:  The atrial septum is grossly normal.  LEFT VENTRICLE PLAX 2D LVIDd:         4.80 cm LVIDs:         3.10 cm LV PW:         1.10 cm LV IVS:        1.20 cm LVOT diam:     2.10 cm LV SV:         93 LV SV Index:   40 LVOT Area:     3.46 cm  RIGHT VENTRICLE             IVC RV Basal diam:  3.20 cm     IVC diam: 1.40 cm RV S prime:     13.90 cm/s TAPSE (M-mode): 2.7 cm LEFT ATRIUM              Index        RIGHT ATRIUM           Index LA diam:        3.90 cm  1.66 cm/m   RA Area:     21.60 cm LA Vol (A2C):   104.0 ml 44.30 ml/m  RA Volume:   59.20 ml  25.22 ml/m LA Vol (A4C):   70.2 ml  29.90 ml/m LA Biplane Vol: 88.7 ml  37.78 ml/m  AORTIC VALVE AV Area (Vmax):    2.86 cm AV Area (Vmean):   2.80 cm AV Area (VTI):     2.75 cm AV Vmax:           156.00 cm/s AV Vmean:          105.000 cm/s AV VTI:            0.338 m AV Peak Grad:      9.7 mmHg AV Mean Grad:      5.0 mmHg LVOT Vmax:         129.00 cm/s LVOT Vmean:        84.900 cm/s LVOT VTI:          0.268 m LVOT/AV VTI ratio: 0.79  AORTA Ao Root diam: 3.40 cm Ao Asc diam:  3.60 cm TRICUSPID VALVE TR Peak grad:   33.6 mmHg TR Vmax:        290.00 cm/s  SHUNTS Systemic VTI:  0.27 m Systemic Diam: 2.10 cm Eleonore Chiquito MD Electronically signed by Eleonore Chiquito MD Signature Date/Time: 07/31/2022/8:20:37 AM    Final    CT CHEST ABDOMEN PELVIS W CONTRAST  Result Date: 07/31/2022 CLINICAL DATA:  Chest pain, known right renal mass, evaluate for metastatic disease EXAM: CT CHEST, ABDOMEN, AND PELVIS WITH CONTRAST TECHNIQUE: Multidetector CT imaging of the chest, abdomen and pelvis was performed following the standard protocol during bolus administration of intravenous contrast. RADIATION DOSE REDUCTION: This exam was performed according to the departmental dose-optimization program which includes automated exposure control, adjustment of the mA and/or kV according to patient size and/or use of iterative reconstruction technique. CONTRAST:  64m OMNIPAQUE IOHEXOL 350 MG/ML  SOLN COMPARISON:  MRI abdomen dated 08/14/2021. CTA chest dated 10/12/2020. FINDINGS: CT CHEST FINDINGS Cardiovascular: Heart is normal in size.  No pericardial effusion. No evidence of thoracic aortic aneurysm. Atherosclerotic calcifications of the aortic arch. Severe three-vessel coronary atherosclerosis. Mediastinum/Nodes: 10 mm short axis right azygoesophageal recess node, previously 12 mm. Additional calcified right perihilar nodes. No suspicious mediastinal lymphadenopathy. Visualized thyroid is unremarkable. Lungs/Pleura: No suspicious pulmonary nodules. Calcified granuloma in the right lower lobe, benign. Mild subpleural reticulation/scarring in the lungs bilaterally, lower lobe predominant. No pleural effusion or pneumothorax. Musculoskeletal: Moderate degenerative changes of the thoracic spine. No focal osseous lesions. CT ABDOMEN PELVIS FINDINGS Hepatobiliary: Liver is within normal limits. No suspicious/enhancing hepatic lesions. Layering small gallstones (series 3/image 66), without associated inflammatory changes. No intrahepatic or extrahepatic duct dilatation. Pancreas: Within normal limits. Spleen: Within normal limits. Adrenals/Urinary Tract: Adrenal glands are within normal limits. Multiple simple bilateral renal cysts, measuring up to 14.8 cm in the left upper kidney (series 3/image 68), benign (Bosniak I). No follow-up is recommended. 2.5 x 2.6 cm enhancing lesion in the anterior right upper kidney (series 3/image 70), grossly unchanged from prior MRI, compatible with solid renal neoplasm such as renal cell carcinoma. No hydronephrosis. Bladder is underdistended but unremarkable. Stomach/Bowel: Stomach is notable for a tiny hiatal hernia. No evidence of bowel obstruction. Normal appendix (series 3/image 98). Sigmoid diverticulosis, without evidence of diverticulitis. Vascular/Lymphatic: No evidence of abdominal aortic aneurysm. Atherosclerotic calcifications of the abdominal aorta and branch  vessels. No suspicious abdominopelvic lymphadenopathy. Reproductive: Prostate is unremarkable. Other: No abdominopelvic ascites. Musculoskeletal: Mild anterior wedging at L1, chronic. Degenerative changes of the lumbar spine. No focal osseous lesions. IMPRESSION: 2.6 cm enhancing lesion in the anterior right upper kidney, grossly unchanged from prior MRI, compatible with solid renal neoplasm such as renal cell carcinoma. No findings suspicious for metastatic disease. Cholelithiasis, without associated inflammatory changes. Additional ancillary findings as above. Electronically Signed   By: Julian Hy M.D.   On: 07/31/2022 02:13   DG Chest 2 View  Result Date: 07/30/2022 CLINICAL DATA:  Chest pain EXAM: CHEST - 2 VIEW COMPARISON:  10/11/2020 FINDINGS: Cardiac silhouette enlarged. No evidence of pneumothorax or pleural effusion. Pulmonary vascular congestion. No evidence of pulmonary edema. No focal consolidation. Aorta is calcified. There are thoracic degenerative changes. IMPRESSION: Enlarged cardiac silhouette.  Pulmonary vascular congestion. Electronically Signed   By: Sammie Bench M.D.   On: 07/30/2022 17:57    Pending Labs Unresulted Labs (From admission, onward)     Start     Ordered   08/01/22 0500  CBC  Tomorrow morning,   R        07/31/22 0947   08/01/22 0500  Comprehensive metabolic panel  Tomorrow morning,   R        07/31/22 1022   08/01/22 0500  Magnesium  Tomorrow morning,   R        07/31/22 1022   07/31/22 1700  Hemoglobin and hematocrit, blood  5A & 5P,   TIMED        07/31/22 0947   07/31/22 0000  Type and screen  R       Comments: [place]    07/31/22 0134            Vitals/Pain Today's Vitals   07/31/22 1315 07/31/22 1330 07/31/22 1335 07/31/22 1425  BP: (!) 173/94   (!) 132/58  Pulse: 77 67  89  Resp: '20 16  18  '$ Temp:  TempSrc:      SpO2: 100% 100%  100%  PainSc:   0-No pain     Isolation Precautions No active  isolations  Medications Medications  insulin aspart (novoLOG) injection 0-15 Units (8 Units Subcutaneous Given 07/31/22 1330)  isosorbide mononitrate (IMDUR) 24 hr tablet 30 mg (30 mg Oral Given 07/31/22 0449)  metoprolol tartrate (LOPRESSOR) tablet 12.5 mg (12.5 mg Oral Given 07/31/22 0849)  rosuvastatin (CRESTOR) tablet 40 mg (40 mg Oral Given 07/31/22 0850)  acetaminophen (TYLENOL) tablet 650 mg (has no administration in time range)  prochlorperazine (COMPAZINE) injection 5 mg (has no administration in time range)  melatonin tablet 3 mg (has no administration in time range)  pantoprazole (PROTONIX) EC tablet 40 mg (has no administration in time range)  bisacodyl (DULCOLAX) EC tablet 20 mg (has no administration in time range)  metoCLOPramide (REGLAN) injection 10 mg (has no administration in time range)  peg 3350 powder (MOVIPREP) kit 200 g (has no administration in time range)  iohexol (OMNIPAQUE) 350 MG/ML injection 80 mL (80 mLs Intravenous Contrast Given 07/31/22 0206)    Mobility walks     Focused Assessments    R Recommendations: See Admitting Provider Note  Report given to:   Additional Notes:

## 2022-07-31 NOTE — ED Notes (Signed)
Cardiology at bedside

## 2022-07-31 NOTE — Progress Notes (Signed)
RT went to place pt on cpap, pt stated he "doesn't feel the urge to wear it right now."

## 2022-08-01 ENCOUNTER — Encounter (HOSPITAL_COMMUNITY): Payer: Self-pay | Admitting: Internal Medicine

## 2022-08-01 ENCOUNTER — Inpatient Hospital Stay (HOSPITAL_COMMUNITY): Payer: Medicare HMO | Admitting: Anesthesiology

## 2022-08-01 ENCOUNTER — Encounter (HOSPITAL_COMMUNITY)
Admission: EM | Disposition: A | Discharge status: Home Health | Payer: Self-pay | Source: Home / Self Care | Attending: Internal Medicine

## 2022-08-01 DIAGNOSIS — D123 Benign neoplasm of transverse colon: Secondary | ICD-10-CM

## 2022-08-01 DIAGNOSIS — E119 Type 2 diabetes mellitus without complications: Secondary | ICD-10-CM

## 2022-08-01 DIAGNOSIS — K648 Other hemorrhoids: Secondary | ICD-10-CM | POA: Diagnosis not present

## 2022-08-01 DIAGNOSIS — K2950 Unspecified chronic gastritis without bleeding: Secondary | ICD-10-CM | POA: Diagnosis not present

## 2022-08-01 DIAGNOSIS — C182 Malignant neoplasm of ascending colon: Secondary | ICD-10-CM

## 2022-08-01 DIAGNOSIS — K635 Polyp of colon: Secondary | ICD-10-CM | POA: Diagnosis not present

## 2022-08-01 DIAGNOSIS — D125 Benign neoplasm of sigmoid colon: Secondary | ICD-10-CM

## 2022-08-01 DIAGNOSIS — K5730 Diverticulosis of large intestine without perforation or abscess without bleeding: Secondary | ICD-10-CM

## 2022-08-01 DIAGNOSIS — K3189 Other diseases of stomach and duodenum: Secondary | ICD-10-CM

## 2022-08-01 DIAGNOSIS — D509 Iron deficiency anemia, unspecified: Secondary | ICD-10-CM

## 2022-08-01 DIAGNOSIS — D649 Anemia, unspecified: Secondary | ICD-10-CM | POA: Diagnosis not present

## 2022-08-01 DIAGNOSIS — Z515 Encounter for palliative care: Secondary | ICD-10-CM | POA: Diagnosis not present

## 2022-08-01 DIAGNOSIS — D128 Benign neoplasm of rectum: Secondary | ICD-10-CM

## 2022-08-01 DIAGNOSIS — D500 Iron deficiency anemia secondary to blood loss (chronic): Secondary | ICD-10-CM | POA: Diagnosis not present

## 2022-08-01 DIAGNOSIS — K2980 Duodenitis without bleeding: Secondary | ICD-10-CM

## 2022-08-01 DIAGNOSIS — I214 Non-ST elevation (NSTEMI) myocardial infarction: Secondary | ICD-10-CM | POA: Diagnosis not present

## 2022-08-01 HISTORY — PX: HEMOSTASIS CLIP PLACEMENT: SHX6857

## 2022-08-01 HISTORY — PX: COLONOSCOPY: SHX5424

## 2022-08-01 HISTORY — PX: POLYPECTOMY: SHX5525

## 2022-08-01 HISTORY — PX: SUBMUCOSAL TATTOO INJECTION: SHX6856

## 2022-08-01 HISTORY — PX: ESOPHAGOGASTRODUODENOSCOPY (EGD) WITH PROPOFOL: SHX5813

## 2022-08-01 HISTORY — PX: BIOPSY: SHX5522

## 2022-08-01 LAB — COMPREHENSIVE METABOLIC PANEL
ALT: 18 U/L (ref 0–44)
AST: 24 U/L (ref 15–41)
Albumin: 3.3 g/dL — ABNORMAL LOW (ref 3.5–5.0)
Alkaline Phosphatase: 78 U/L (ref 38–126)
Anion gap: 10 (ref 5–15)
BUN: 17 mg/dL (ref 8–23)
CO2: 22 mmol/L (ref 22–32)
Calcium: 9 mg/dL (ref 8.9–10.3)
Chloride: 109 mmol/L (ref 98–111)
Creatinine, Ser: 1.16 mg/dL (ref 0.61–1.24)
GFR, Estimated: >60 mL/min (ref >60)
Glucose, Bld: 186 mg/dL — ABNORMAL HIGH (ref 70–99)
Potassium: 4.1 mmol/L (ref 3.5–5.1)
Sodium: 141 mmol/L (ref 135–145)
Total Bilirubin: 0.9 mg/dL (ref 0.3–1.2)
Total Protein: 6.1 g/dL — ABNORMAL LOW (ref 6.5–8.1)

## 2022-08-01 LAB — GLUCOSE, CAPILLARY
Glucose-Capillary: 144 mg/dL — ABNORMAL HIGH (ref 70–99)
Glucose-Capillary: 123 mg/dL — ABNORMAL HIGH (ref 70–99)
Glucose-Capillary: 150 mg/dL — ABNORMAL HIGH (ref 70–99)
Glucose-Capillary: 203 mg/dL — ABNORMAL HIGH (ref 70–99)
Glucose-Capillary: 191 mg/dL — ABNORMAL HIGH (ref 70–99)
Glucose-Capillary: 155 mg/dL — ABNORMAL HIGH (ref 70–99)

## 2022-08-01 LAB — CBC
HCT: 31 % — ABNORMAL LOW (ref 39.0–52.0)
Hemoglobin: 9.5 g/dL — ABNORMAL LOW (ref 13.0–17.0)
MCH: 25.1 pg — ABNORMAL LOW (ref 26.0–34.0)
MCHC: 30.6 g/dL (ref 30.0–36.0)
MCV: 81.8 fL (ref 80.0–100.0)
Platelets: 198 10*3/uL (ref 150–400)
RBC: 3.79 MIL/uL — ABNORMAL LOW (ref 4.22–5.81)
RDW: 15.9 % — ABNORMAL HIGH (ref 11.5–15.5)
WBC: 7.7 10*3/uL (ref 4.0–10.5)
nRBC: 0 % (ref 0.0–0.2)

## 2022-08-01 LAB — MAGNESIUM: Magnesium: 1.9 mg/dL (ref 1.7–2.4)

## 2022-08-01 SURGERY — ESOPHAGOGASTRODUODENOSCOPY (EGD) WITH PROPOFOL
Anesthesia: Monitor Anesthesia Care

## 2022-08-01 MED ORDER — BOOST / RESOURCE BREEZE PO LIQD CUSTOM
1.0000 | Freq: Three times a day (TID) | ORAL | Status: DC
  Administered 2022-08-02 (×2): 1 via ORAL

## 2022-08-01 MED ORDER — PHENYLEPHRINE 80 MCG/ML (10ML) SYRINGE FOR IV PUSH (FOR BLOOD PRESSURE SUPPORT)
PREFILLED_SYRINGE | INTRAVENOUS | Status: DC | PRN
  Administered 2022-08-01: 120 ug via INTRAVENOUS
  Administered 2022-08-01: 160 ug via INTRAVENOUS
  Administered 2022-08-01 (×3): 80 ug via INTRAVENOUS
  Administered 2022-08-01: 120 ug via INTRAVENOUS
  Administered 2022-08-01: 160 ug via INTRAVENOUS

## 2022-08-01 MED ORDER — LACTATED RINGERS IV SOLN: INTRAVENOUS | Status: DC | PRN

## 2022-08-01 MED ORDER — LIDOCAINE 2% (20 MG/ML) 5 ML SYRINGE
INTRAMUSCULAR | Status: DC | PRN
  Administered 2022-08-01 (×2): 50 mg via INTRAVENOUS

## 2022-08-01 MED ORDER — EPHEDRINE SULFATE-NACL 50-0.9 MG/10ML-% IV SOSY
PREFILLED_SYRINGE | INTRAVENOUS | Status: DC | PRN
  Administered 2022-08-01 (×2): 5 mg via INTRAVENOUS
  Administered 2022-08-01 (×2): 2.5 mg via INTRAVENOUS

## 2022-08-01 MED ORDER — SPOT INK MARKER SYRINGE KIT
PACK | SUBMUCOSAL | Status: DC | PRN
  Administered 2022-08-01: 5 mL via SUBMUCOSAL

## 2022-08-01 MED ORDER — SODIUM CHLORIDE 0.9 % IV SOLN
250.0000 mg | Freq: Every day | INTRAVENOUS | Status: AC
  Administered 2022-08-01 – 2022-08-07 (×6): 250 mg via INTRAVENOUS
  Filled 2022-08-01 (×7): qty 20

## 2022-08-01 MED ORDER — PROPOFOL 500 MG/50ML IV EMUL
INTRAVENOUS | Status: DC | PRN
  Administered 2022-08-01: 100 ug/kg/min via INTRAVENOUS

## 2022-08-01 SURGICAL SUPPLY — 15 items

## 2022-08-01 NOTE — Progress Notes (Signed)
Mobility Specialist Progress Note    08/01/22 1222  Mobility  Activity Ambulated with assistance in room  Level of Assistance Contact guard assist, steadying assist  Assistive Device Other (Comment) (HHA)  Distance Ambulated (ft) 15 ft  Activity Response Tolerated well  Mobility Referral Yes  $Mobility charge 1 Mobility   Post-Mobility: 77 HR, 98% SpO2  Pt received sitting EOB requesting to get up to chair. No complaints. Left with call bell in reach and wife present.   Ryan Garcia Mobility Specialist  Please Psychologist, sport and exercise or Rehab Office at [phone]

## 2022-08-01 NOTE — Progress Notes (Signed)
Rounding Note    Patient Name: Ryan Garcia Date of Encounter: 08/01/2022  [place] Cardiologist: Kirk Ruths, MD   Subjective   Patient denies any further chest pain since admit. No dyspnea.   Inpatient Medications    Scheduled Meds:  insulin aspart  0-15 Units Subcutaneous Q4H   isosorbide mononitrate  30 mg Oral Daily   metoprolol tartrate  12.5 mg Oral BID   pantoprazole  40 mg Oral Q0600   rosuvastatin  40 mg Oral Daily   Continuous Infusions:  PRN Meds: acetaminophen, melatonin, prochlorperazine   Vital Signs    Vitals:   08/01/22 0900 08/01/22 1018 08/01/22 1030 08/01/22 1040  BP: (!) 149/96 136/64 (!) 111/48 (!) 122/108  Pulse:  61 68 66  Resp: 17 17 (!) 21 14  Temp: 97.7 F (36.5 C) 97.7 F (36.5 C)    TempSrc: Tympanic Temporal    SpO2: 96% 99% 97% 97%  Weight:      Height:        Intake/Output Summary (Last 24 hours) at 08/01/2022 1109 Last data filed at 08/01/2022 1009 Gross per 24 hour  Intake 700 ml  Output 575 ml  Net 125 ml      07/31/2022    4:18 PM 07/05/2022   10:45 AM 07/03/2022   10:38 AM  Last 3 Weights  Weight (lbs) 247 lb 6.4 oz 257 lb 8 oz 253 lb  Weight (kg) 112.22 kg 116.801 kg 114.76 kg      Telemetry    NSR with  PACs - Personally Reviewed  ECG    07/31/22: NSR with PACs and PVCs. Nonspecific ST abnormality - Personally Reviewed  Physical Exam   GEN: No acute distress.  obese Neck: No JVD, hard of hearing Cardiac: IRRR, no murmurs, rubs, or gallops.  Respiratory: Clear to auscultation bilaterally. GI: Soft, nontender, non-distended  MS: No edema; No deformity. Neuro:  Nonfocal  Psych: Normal affect   Labs    High Sensitivity Troponin:   Recent Labs  Lab 07/30/22 1655 07/30/22 1956 07/31/22 0130 07/31/22 0447  TROPONINIHS 93* 425* 651* 609*     Chemistry Recent Labs  Lab 07/30/22 1655 07/31/22 0336 08/01/22 0041  NA 139 140 141  K 4.5 4.0 4.1  CL 107 106 109  CO2 '23 24 22   '$ GLUCOSE 321* 274* 186*  BUN '23 22 17  '$ CREATININE 1.49* 1.14 1.16  CALCIUM 8.4* 8.7* 9.0  MG  --  2.1 1.9  PROT  --  5.8* 6.1*  ALBUMIN  --  3.1* 3.3*  AST  --  23 24  ALT  --  16 18  ALKPHOS  --  75 78  BILITOT  --  0.4 0.9  GFRNONAA 45* >60 >60  ANIONGAP '9 10 10    '$ Lipids  Recent Labs  Lab 07/31/22 0447  CHOL 86  TRIG 95  HDL 29*  LDLCALC 38  CHOLHDL 3.0    Hematology Recent Labs  Lab 07/30/22 1655 07/31/22 0220 07/31/22 0336 07/31/22 0447 07/31/22 1933 08/01/22 0041  WBC 6.8  --  6.0  --   --  7.7  RBC 3.50* 3.14* 3.23*  --   --  3.79*  HGB 8.7*  --  8.2* 8.6* 9.2* 9.5*  HCT 29.6*  --  27.0* 28.4* 30.2* 31.0*  MCV 84.6  --  83.6  --   --  81.8  MCH 24.9*  --  25.4*  --   --  25.1*  MCHC 29.4*  --  30.4  --   --  30.6  RDW 15.8*  --  15.8*  --   --  15.9*  PLT 205  --  178  --   --  198   Thyroid No results for input(s): "TSH", "FREET4" in the last 168 hours.  BNP Recent Labs  Lab 07/31/22 0220  BNP 122.8*    DDimer No results for input(s): "DDIMER" in the last 168 hours.   Radiology    ECHOCARDIOGRAM COMPLETE  Result Date: 07/31/2022    ECHOCARDIOGRAM REPORT   Patient Name:   Ryan Garcia Date of Exam: 07/31/2022 Medical Rec #:  [id number]     Height:       71.0 in Accession #:    [id number]    Weight:       257.5 lb Date of Birth:  [birth year]     BSA:          2.348 m Patient Age:    89 years      BP:           151/68 mmHg Patient Gender: M             HR:           64 bpm. Exam Location:  Inpatient Procedure: 2D Echo, Cardiac Doppler and Color Doppler                       STAT ECHO Reported to: Dr Eleonore Chiquito on 07/31/2022 8:06:00 AM. Indications:    NSTEMI  History:        Patient has prior history of Echocardiogram examinations, most                 recent 10/14/2020. Previous Myocardial Infarction,                 Arrythmias:Atrial Fibrillation; Risk Factors:Diabetes,                 Hypertension and Sleep Apnea.  Sonographer:    Clayton Lefort RDCS (AE)  Referring Phys: [id number] [place]  1. Left ventricular ejection fraction, by estimation, is 60 to 65%. The left ventricle has normal function. The left ventricle has no regional wall motion abnormalities. There is mild concentric left ventricular hypertrophy. Left ventricular diastolic function could not be evaluated.  2. Right ventricular systolic function is normal. The right ventricular size is normal. There is mildly elevated pulmonary artery systolic pressure. The estimated right ventricular systolic pressure is 57.3 mmHg.  3. The mitral valve is grossly normal. No evidence of mitral valve regurgitation. No evidence of mitral stenosis.  4. The aortic valve is tricuspid. There is mild calcification of the aortic valve. There is mild thickening of the aortic valve. Aortic valve regurgitation is not visualized. Aortic valve sclerosis/calcification is present, without any evidence of aortic stenosis.  5. The inferior vena cava is normal in size with greater than 50% respiratory variability, suggesting right atrial pressure of 3 mmHg. Comparison(s): No significant change from prior study. FINDINGS  Left Ventricle: Left ventricular ejection fraction, by estimation, is 60 to 65%. The left ventricle has normal function. The left ventricle has no regional wall motion abnormalities. The left ventricular internal cavity size was normal in size. There is  mild concentric left ventricular hypertrophy. Left ventricular diastolic function could not be evaluated due to atrial fibrillation. Left ventricular diastolic function could not be evaluated. Right Ventricle: The right ventricular size  is normal. No increase in right ventricular wall thickness. Right ventricular systolic function is normal. There is mildly elevated pulmonary artery systolic pressure. The tricuspid regurgitant velocity is 2.90  m/s, and with an assumed right atrial pressure of 3 mmHg, the estimated right ventricular systolic pressure is  84.1 mmHg. Left Atrium: Left atrial size was normal in size. Right Atrium: Right atrial size was normal in size. Pericardium: There is no evidence of pericardial effusion. Mitral Valve: The mitral valve is grossly normal. No evidence of mitral valve regurgitation. No evidence of mitral valve stenosis. Tricuspid Valve: The tricuspid valve is grossly normal. Tricuspid valve regurgitation is mild . No evidence of tricuspid stenosis. Aortic Valve: The aortic valve is tricuspid. There is mild calcification of the aortic valve. There is mild thickening of the aortic valve. Aortic valve regurgitation is not visualized. Aortic valve sclerosis/calcification is present, without any evidence of aortic stenosis. Aortic valve mean gradient measures 5.0 mmHg. Aortic valve peak gradient measures 9.7 mmHg. Aortic valve area, by VTI measures 2.75 cm. Pulmonic Valve: The pulmonic valve was grossly normal. Pulmonic valve regurgitation is not visualized. No evidence of pulmonic stenosis. Aorta: The aortic root and ascending aorta are structurally normal, with no evidence of dilitation. Venous: The inferior vena cava is normal in size with greater than 50% respiratory variability, suggesting right atrial pressure of 3 mmHg. IAS/Shunts: The atrial septum is grossly normal.  LEFT VENTRICLE PLAX 2D LVIDd:         4.80 cm LVIDs:         3.10 cm LV PW:         1.10 cm LV IVS:        1.20 cm LVOT diam:     2.10 cm LV SV:         93 LV SV Index:   40 LVOT Area:     3.46 cm  RIGHT VENTRICLE             IVC RV Basal diam:  3.20 cm     IVC diam: 1.40 cm RV S prime:     13.90 cm/s TAPSE (M-mode): 2.7 cm LEFT ATRIUM              Index        RIGHT ATRIUM           Index LA diam:        3.90 cm  1.66 cm/m   RA Area:     21.60 cm LA Vol (A2C):   104.0 ml 44.30 ml/m  RA Volume:   59.20 ml  25.22 ml/m LA Vol (A4C):   70.2 ml  29.90 ml/m LA Biplane Vol: 88.7 ml  37.78 ml/m  AORTIC VALVE AV Area (Vmax):    2.86 cm AV Area (Vmean):   2.80 cm AV  Area (VTI):     2.75 cm AV Vmax:           156.00 cm/s AV Vmean:          105.000 cm/s AV VTI:            0.338 m AV Peak Grad:      9.7 mmHg AV Mean Grad:      5.0 mmHg LVOT Vmax:         129.00 cm/s LVOT Vmean:        84.900 cm/s LVOT VTI:          0.268 m LVOT/AV VTI ratio: 0.79  AORTA Ao Root diam: 3.40  cm Ao Asc diam:  3.60 cm TRICUSPID VALVE TR Peak grad:   33.6 mmHg TR Vmax:        290.00 cm/s  SHUNTS Systemic VTI:  0.27 m Systemic Diam: 2.10 cm Eleonore Chiquito MD Electronically signed by Eleonore Chiquito MD Signature Date/Time: 07/31/2022/8:20:37 AM    Final    CT CHEST ABDOMEN PELVIS W CONTRAST  Result Date: 07/31/2022 CLINICAL DATA:  Chest pain, known right renal mass, evaluate for metastatic disease EXAM: CT CHEST, ABDOMEN, AND PELVIS WITH CONTRAST TECHNIQUE: Multidetector CT imaging of the chest, abdomen and pelvis was performed following the standard protocol during bolus administration of intravenous contrast. RADIATION DOSE REDUCTION: This exam was performed according to the departmental dose-optimization program which includes automated exposure control, adjustment of the mA and/or kV according to patient size and/or use of iterative reconstruction technique. CONTRAST:  32m OMNIPAQUE IOHEXOL 350 MG/ML SOLN COMPARISON:  MRI abdomen dated 08/14/2021. CTA chest dated 10/12/2020. FINDINGS: CT CHEST FINDINGS Cardiovascular: Heart is normal in size.  No pericardial effusion. No evidence of thoracic aortic aneurysm. Atherosclerotic calcifications of the aortic arch. Severe three-vessel coronary atherosclerosis. Mediastinum/Nodes: 10 mm short axis right azygoesophageal recess node, previously 12 mm. Additional calcified right perihilar nodes. No suspicious mediastinal lymphadenopathy. Visualized thyroid is unremarkable. Lungs/Pleura: No suspicious pulmonary nodules. Calcified granuloma in the right lower lobe, benign. Mild subpleural reticulation/scarring in the lungs bilaterally, lower lobe predominant. No  pleural effusion or pneumothorax. Musculoskeletal: Moderate degenerative changes of the thoracic spine. No focal osseous lesions. CT ABDOMEN PELVIS FINDINGS Hepatobiliary: Liver is within normal limits. No suspicious/enhancing hepatic lesions. Layering small gallstones (series 3/image 66), without associated inflammatory changes. No intrahepatic or extrahepatic duct dilatation. Pancreas: Within normal limits. Spleen: Within normal limits. Adrenals/Urinary Tract: Adrenal glands are within normal limits. Multiple simple bilateral renal cysts, measuring up to 14.8 cm in the left upper kidney (series 3/image 68), benign (Bosniak I). No follow-up is recommended. 2.5 x 2.6 cm enhancing lesion in the anterior right upper kidney (series 3/image 70), grossly unchanged from prior MRI, compatible with solid renal neoplasm such as renal cell carcinoma. No hydronephrosis. Bladder is underdistended but unremarkable. Stomach/Bowel: Stomach is notable for a tiny hiatal hernia. No evidence of bowel obstruction. Normal appendix (series 3/image 98). Sigmoid diverticulosis, without evidence of diverticulitis. Vascular/Lymphatic: No evidence of abdominal aortic aneurysm. Atherosclerotic calcifications of the abdominal aorta and branch vessels. No suspicious abdominopelvic lymphadenopathy. Reproductive: Prostate is unremarkable. Other: No abdominopelvic ascites. Musculoskeletal: Mild anterior wedging at L1, chronic. Degenerative changes of the lumbar spine. No focal osseous lesions. IMPRESSION: 2.6 cm enhancing lesion in the anterior right upper kidney, grossly unchanged from prior MRI, compatible with solid renal neoplasm such as renal cell carcinoma. No findings suspicious for metastatic disease. Cholelithiasis, without associated inflammatory changes. Additional ancillary findings as above. Electronically Signed   By: SJulian HyM.D.   On: 07/31/2022 02:13   DG Chest 2 View  Result Date: 07/30/2022 CLINICAL DATA:  Chest  pain EXAM: CHEST - 2 VIEW COMPARISON:  10/11/2020 FINDINGS: Cardiac silhouette enlarged. No evidence of pneumothorax or pleural effusion. Pulmonary vascular congestion. No evidence of pulmonary edema. No focal consolidation. Aorta is calcified. There are thoracic degenerative changes. IMPRESSION: Enlarged cardiac silhouette.  Pulmonary vascular congestion. Electronically Signed   By: JSammie BenchM.D.   On: 07/30/2022 17:57    Cardiac Studies    Cardiac cath 04/12/20:  LEFT HEART CATH AND CORONARY ANGIOGRAPHY   Conclusion    Prox LAD lesion is 85% stenosed. OColon Flattery  1st Diag to 1st Diag lesion is 85% stenosed. Dist LAD lesion is 65% stenosed. Prox LAD to Mid LAD lesion is 75% stenosed. Ost Cx to Mid Cx lesion is 80% stenosed. Ost 1st Mrg to 1st Mrg lesion is 95% stenosed. Mid Cx to Dist Cx lesion is 65% stenosed. 2nd Mrg lesion is 75% stenosed. RPDA lesion is 60% stenosed. 1st RPL lesion is 70% stenosed. Prox RCA to Mid RCA lesion is 30% stenosed. Previously placed Dist RCA drug eluting stent is widely patent. Balloon angioplasty was performed. LV end diastolic pressure is normal.   1. Severe 2 vessel obstructive CAD 2. Patent stent in the RCA 3. Normal LVEDP   Plan: the severe disease in the left coronary distribution is unchanged from prior. No suitable targets for PCI. Recommend continued medical therapy. If refractory angina would need to consider CABG.    Coronary Diagrams  Diagnostic Dominance: Right  Intervention  Echo: 07/31/2022   IMPRESSIONS     1. Left ventricular ejection fraction, by estimation, is 60 to 65%. The  left ventricle has normal function. The left ventricle has no regional  wall motion abnormalities. There is mild concentric left ventricular  hypertrophy. Left ventricular diastolic  function could not be evaluated.   2. Right ventricular systolic function is normal. The right ventricular  size is normal. There is mildly elevated pulmonary artery  systolic  pressure. The estimated right ventricular systolic pressure is 93.7 mmHg.   3. The mitral valve is grossly normal. No evidence of mitral valve  regurgitation. No evidence of mitral stenosis.   4. The aortic valve is tricuspid. There is mild calcification of the  aortic valve. There is mild thickening of the aortic valve. Aortic valve  regurgitation is not visualized. Aortic valve sclerosis/calcification is  present, without any evidence of  aortic stenosis.   5. The inferior vena cava is normal in size with greater than 50%  respiratory variability, suggesting right atrial pressure of 3 mmHg.   Comparison(s): No significant change from prior study.   FINDINGS   Left Ventricle: Left ventricular ejection fraction, by estimation, is 60  to 65%. The left ventricle has normal function. The left ventricle has no  regional wall motion abnormalities. The left ventricular internal cavity  size was normal in size. There is   mild concentric left ventricular hypertrophy. Left ventricular diastolic  function could not be evaluated due to atrial fibrillation. Left  ventricular diastolic function could not be evaluated.   Right Ventricle: The right ventricular size is normal. No increase in  right ventricular wall thickness. Right ventricular systolic function is  normal. There is mildly elevated pulmonary artery systolic pressure. The  tricuspid regurgitant velocity is 2.90   m/s, and with an assumed right atrial pressure of 3 mmHg, the estimated  right ventricular systolic pressure is 90.2 mmHg.   Left Atrium: Left atrial size was normal in size.   Right Atrium: Right atrial size was normal in size.   Pericardium: There is no evidence of pericardial effusion.   Mitral Valve: The mitral valve is grossly normal. No evidence of mitral  valve regurgitation. No evidence of mitral valve stenosis.   Tricuspid Valve: The tricuspid valve is grossly normal. Tricuspid valve  regurgitation  is mild . No evidence of tricuspid stenosis.   Aortic Valve: The aortic valve is tricuspid. There is mild calcification  of the aortic valve. There is mild thickening of the aortic valve. Aortic  valve regurgitation is not visualized. Aortic valve  sclerosis/calcification is present, without any  evidence of aortic stenosis. Aortic valve mean gradient measures 5.0 mmHg.  Aortic valve peak gradient measures 9.7 mmHg. Aortic valve area, by VTI  measures 2.75 cm.   Pulmonic Valve: The pulmonic valve was grossly normal. Pulmonic valve  regurgitation is not visualized. No evidence of pulmonic stenosis.   Aorta: The aortic root and ascending aorta are structurally normal, with  no evidence of dilitation.   Venous: The inferior vena cava is normal in size with greater than 50%  respiratory variability, suggesting right atrial pressure of 3 mmHg.   IAS/Shunts: The atrial septum is grossly normal.     Patient Profile     89 y.o. male with PMH of 3vCAD, paroxsymal Afib, HTN, HLD who presented with chest pain and found to have elevated troponin and anemia.   Assessment & Plan    NSTEMI- type 2 -- presented with chest pain, several episode yesterday. hsTn 93>>425>>651>>609 flat trend. EKG without significant ischemia. Pain free since admission. Suspect this may be demand ischemia in the setting of new anemia. Known CAD on prior cath with severe diffuse LAD and LCx disease- not amenable to PCI.  Echo with LVEF of 60-65%, no rWMA noted. Given his acute anemia, would not pursue invasive work up at this time. Keep off anticoagulation -- continue metoprolol, statin, Imdur   Iron deficiency anemia.  -- presented with dyspnea, noted to have drop in Hgb from 13>>8.6 over the past year. Denies any over bleeding PTA. Notes progressive fatigue over the past month -- seen by GI, Pan- endoscopy today shows a large fungating mass in the colon c/w CA. Multiple polyps. Biopsy taken. -- plan CT  abd/pelvis. --surgical consult -- on protonix -- Hgb improved to 9.5 on IV iron.  -- If surgery felt to be beneficial then I think patient could tolerate from a cardiac standpoint. Prior to development of anemia he was active and had no significant angina. EF is normal. CV risk for surgery is still at least moderate given age, anemia, CAD   Paroxsymal afib -- EKG read as afib, but review of telemetry shows sinus with 1st degree AVB with freq PACs/PVCs -- Eliquis currently held   HTN -- elevated in the ED -- continue metoprolol and Imdur. No room to increase BB with bradycardia   AKI -- Cr 1.49, improved to 1.16 this morning       For questions or updates, please contact [place] Please consult [email or web address] for contact info under        Signed, Katlin Bortner [place], MD  08/01/2022, 11:09 AM

## 2022-08-01 NOTE — Progress Notes (Signed)
  Transition of Care [facility]) Screening Note   Patient Details  Name: Ryan Garcia Date of Birth: 1934/01/01   Transition of Care [facility]) CM/SW Contact:    Cyndi Bender, RN Phone Number: 08/01/2022, 1:44 PM    Transition of Care Department [facility]) has reviewed patient and no TOC needs have been identified at this time. We will continue to monitor patient advancement through interdisciplinary progression rounds. If new patient transition needs arise, please place a TOC consult.

## 2022-08-01 NOTE — Progress Notes (Signed)
Patient ID: Ryan Garcia, male   DOB: 1934/06/08, 89 y.o.   MRN: [id number]   Progress Note from the Palliative Medicine Team at [facility]   Patient Name: Ryan Garcia        Date: 08/01/2022 DOB: 18-Aug-1933  Age: 89 y.o. MRN#: [id number] Attending Physician: Aline August, MD Primary Care Physician: Tower, Wynelle Fanny, MD Admit Date: 07/30/2022   Medical records reviewed, discussed with treatment team   89 y.o. male with medical history significant for CAD s/p PCI, afib on eliquis, CHF, HTN, HLD, OSA, right renal mass who presented to [facility] ED via EMS with chest pain. He was also found to have anemia with concern GI bleed with heme positive stool. He was admitted to the medicine service and GI and cardiology consulted.    He was found to have NSTEMI related to demand ischemia in setting of anemia and eliquis held.    Patient underwent colonoscopy and endoscopy by GI with polyps in proximal ascending, transverse (biopsied), and sigmoid (biopsied) colon and rectum and malignant tumor in mid ascending transverse colon (biopsied). Endoscopy with findings of erythema in duodenum which was biopsied.    CT ch/ab/pelvis completed with renal lesion but without findings suspicious for metastatic disease.   Patient and his family face treatment option decisions, advanced directive decisions and anticipatory care needs.   This NP visited patient at the bedside  as a follow up to  yesterday's [place] and to assess for palliative medicine needs and emotional support.   Ryan Garcia is OOB to the chair, "enjoying" his clear liquid diet, wife at bedside.  Detailed conversation exploring  surgery option for newly diagnosed colon cancer.  Consideration for severe CAD, advanced age and most importantly his values and GOCs.  Detailed risks and benefits of surgery itself and consideration for post-op recovery trajectory.  We explored best case vs worst case scenario.   Initially Ryan Garcia verbalized his  expectation of a quick back home, eating a regular diet and return to baseline in two weeks.  I presented a likely more realistic trajectory of slow advanced diet, possible need for short term rehab and 4-6 week recovery time, surgical risks discussed.  Ryan Garcia is very much is interested in pursuing surgical option.   He and his wife will continue to process options over the next few days as all await biopsy report on multiple polyps.    Ryan Garcia continues to express a pragmatic approach to his current medical situation.  He repeated reaffirms his readiness to go to heaven.   He is clear that quality is a priority for him.    If at any point in time medial interventions did not provide him with the quality that is so important to him, he would shift to a more comfort path allowing for a natural death.  Education offered today regarding  the importance of continued conversation with family and their  medical providers regarding overall plan of care and treatment options,  ensuring decisions are within the context of the patients values and GOCs.  Questions and concerns addressed     Discussed with Margie Billet PA-C  This nurse practitioner informed  the patient/family  that I will be out of the hospital until Monday morning.     I will ask my colleague Tacey Ruiz NP to follow up tomorrow and through the weekend.  Call palliative medicine team phone # [phone] with questions or concerns.  Total time spent on the unit  was 90 minutes   [name] NP  Palliative Medicine Team Team Phone # [phone] Pager [phone]             Education offered today regarding  the importance of continued conversation with family and their  medical providers regarding overall plan of care and treatment options,  ensuring decisions are within the context of the patients values and GOCs.  Questions and concerns addressed   Discussed with Dr   [name] NP  Palliative  Medicine Team Team Phone # [phone] Pager [phone]

## 2022-08-01 NOTE — Progress Notes (Signed)
Pt placed self on home cpap machine.

## 2022-08-01 NOTE — Op Note (Signed)
[facility] Patient Name: Ryan Garcia Procedure Date : 08/01/2022 MRN: [id number] Attending MD: Jerene Bears , MD, [id number] Date of Birth: 02-14-1934 CSN: [id number] Age: 89 Admit Type: Inpatient Procedure:                Upper GI endoscopy Indications:              Iron deficiency anemia, Heme positive stool Providers:                Lajuan Lines. Hilarie Fredrickson, MD, Jeanella Cara, RN,                            William Dalton, Technician Referring MD:             [facility] Medicines:                Monitored Anesthesia Care Complications:            No immediate complications. Estimated Blood Loss:     Estimated blood loss was minimal. Procedure:                Pre-Anesthesia Assessment:                           - Prior to the procedure, a History and Physical                            was performed, and patient medications and                            allergies were reviewed. The patient's tolerance of                            previous anesthesia was also reviewed. The risks                            and benefits of the procedure and the sedation                            options and risks were discussed with the patient.                            All questions were answered, and informed consent                            was obtained. Prior Anticoagulants: The patient has                            taken Eliquis (apixaban), last dose was 2 days                            prior to procedure. ASA Grade Assessment: III - A                            patient with severe systemic disease. After  reviewing the risks and benefits, the patient was                            deemed in satisfactory condition to undergo the                            procedure.                           After obtaining informed consent, the endoscope was                            passed under direct vision. Throughout the                             procedure, the patient's blood pressure, pulse, and                            oxygen saturations were monitored continuously. The                            [id number]-H190 ([id number]) Olympus endoscope was introduced                            through the mouth, and advanced to the second part                            of duodenum. The upper GI endoscopy was                            accomplished without difficulty. The patient                            tolerated the procedure well. Scope In: Scope Out: Findings:      The examined esophagus was normal.      The entire examined stomach was normal. Biopsies were taken with a cold       forceps for histology and Helicobacter pylori testing.      Diffuse mildly erythematous mucosa without active bleeding and with no       stigmata of bleeding was found in the duodenal bulb.      The second portion of the duodenum was normal. Biopsies for histology       were taken with a cold forceps for evaluation of celiac disease. Impression:               - Normal esophagus.                           - Normal stomach. Biopsied.                           - Erythematous duodenopathy in bulb.                           - Normal second portion of the duodenum. Biopsied. Moderate Sedation:      N/A Recommendation:           -  Patient has a contact number available for                            emergencies. The signs and symptoms of potential                            delayed complications were discussed with the                            patient. Return to normal activities tomorrow.                            Written discharge instructions were provided to the                            patient.                           - Resume previous diet.                           - Continue present medications. Daily PPI.                           - Await pathology results to exclude H. Pylori                            given non-ulcer duodenitis.                            - See the other procedure note for documentation of                            additional recommendations. Procedure Code(s):        --- Professional ---                           [phone], Esophagogastroduodenoscopy, flexible,                            transoral; with biopsy, single or multiple Diagnosis Code(s):        --- Professional ---                           K31.89, Other diseases of stomach and duodenum                           D50.9, Iron deficiency anemia, unspecified                           R19.5, Other fecal abnormalities CPT copyright 2022 American [employer] Association. All rights reserved. The codes documented in this report are preliminary and upon coder review may  be revised to meet current compliance requirements. Jerene Bears, MD 08/01/2022 9:40:08 AM This report has been signed electronically. Number of Addenda: 0

## 2022-08-01 NOTE — Progress Notes (Signed)
MEDICATION RELATED CONSULT NOTE - INITIAL   Pharmacy Consult for IV Iron Repletion Indication: Iron-deficiency anemia in setting of colon cancer  Allergies  Allergen Reactions   Loratadine Other (See Comments)    Not effective   Simvastatin Other (See Comments)    Intolerant- caused joint pain    Patient Measurements: Height: '5\' 11"'$  (180.3 cm) Weight: 112.2 kg (247 lb 6.4 oz) IBW/kg (Calculated) : 75.3  Vital Signs: Temp: 97.7 F (36.5 C) (01/31 1018) Temp Source: Temporal (01/31 1018) BP: 122/108 (01/31 1040) Pulse Rate: 66 (01/31 1040) Intake/Output from previous day: 01/30 0701 - 01/31 0700 In: -  Out: 875 [Urine:875] Intake/Output from this shift: Total I/O In: 700 [I.V.:700] Out: -   Labs: Recent Labs    07/30/22 1655 07/31/22 0336 07/31/22 0447 07/31/22 1933 08/01/22 0041  WBC 6.8 6.0  --   --  7.7  HGB 8.7* 8.2* 8.6* 9.2* 9.5*  HCT 29.6* 27.0* 28.4* 30.2* 31.0*  PLT 205 178  --   --  198  CREATININE 1.49* 1.14  --   --  1.16  MG  --  2.1  --   --  1.9  PHOS  --  3.2  --   --   --   ALBUMIN  --  3.1*  --   --  3.3*  PROT  --  5.8*  --   --  6.1*  AST  --  23  --   --  24  ALT  --  16  --   --  18  ALKPHOS  --  75  --   --  78  BILITOT  --  0.4  --   --  0.9   Estimated Creatinine Clearance: 58.3 mL/min (by C-G formula based on SCr of 1.16 mg/dL).   Microbiology: Recent Results (from the past 720 hour(s))  MRSA Next Gen by PCR, Nasal     Status: None   Collection Time: 07/31/22  4:30 PM   Specimen: Nasal Mucosa; Nasal Swab  Result Value Ref Range Status   MRSA by PCR Next Gen NOT DETECTED NOT DETECTED Final    Comment: (NOTE) The GeneXpert MRSA Assay (FDA approved for NASAL specimens only), is one component of a comprehensive MRSA colonization surveillance program. It is not intended to diagnose MRSA infection nor to guide or monitor treatment for MRSA infections. Test performance is not FDA approved in patients less than 87  years old. Performed at [facility], [place] [address] [place], [place] [address]     Medical History: Past Medical History:  Diagnosis Date   AC (acromioclavicular) joint bone spurs    lt ankle   Allergy    allergic rhinitis   Anginal pain ([place])    Arthritis    OA   BPH (benign prostatic hyperplasia)    microwave tx of prostate   CAD (coronary artery disease)    a. s/p cath in 2012 showing 70-75% LAD stenosis, 75% D1 stenosis, 75% OM1, 60-70% RCA stenosis, and 80% PDA --> medical therapy pursued b. similar findings by cath in 2016; 06/2017 DES to distal RCA   Chronic kidney disease    Chronic upper back pain    Diabetes mellitus    type II x8 years   GERD (gastroesophageal reflux disease)    HLD (hyperlipidemia)    10/12: TC 208, TG 213, HDL 36, LDL 129   Hx of skin cancer, basal cell    many skin cancers removed/under constant  treatment.   Hypertension    NSTEMI (non-ST elevated myocardial infarction) ([place]) 06/18/2017   DES to distal RCA   Obstructive sleep apnea on CPAP    Paroxysmal atrial flutter (H[facility])    Renal mass    Scoliosis     Medications:  Scheduled:   insulin aspart  0-15 Units Subcutaneous Q4H   isosorbide mononitrate  30 mg Oral Daily   metoprolol tartrate  12.5 mg Oral BID   pantoprazole  40 mg Oral Q0600   rosuvastatin  40 mg Oral Daily   Infusions:   ferric gluconate (FERRLECIT) IVPB      Assessment: 89 yo M with colon cancer has iron deficiency anemia with iron of 25, TSAT of 6, and ferritin level of 6 per labs on 08/01/23. Pharmacy consulted to dose IV iron for repletion.   Calculated iron deficit ~1500-'1750mg'$    Goal of Therapy:  Replete iron stores and help improve Hgb   Plan:  Start ferric gluconate (Ferrlecit) '250mg'$  IV daily x 6 days.  If patient discharge is prior to completion of ferric gluconate regimen above, iron dextran may be given to supplement remainder of iron deficit. Ferrlecit was selected as preferred regimen due to  less risk for adverse reactions when compared to iron dextran. Pharmacy will continue to monitor for tolerance and dispo planning to ensure repletion of iron prior to discharge. Recommend iron panel be repeated ~6-8 weeks after iron repletion has been completed.  Luisa Hart, PharmD, BCPS Clinical Pharmacist 08/01/2022 12:36 PM   Please refer to AMION for pharmacy phone number;a

## 2022-08-01 NOTE — Interval H&P Note (Signed)
History and Physical Interval Note: EGD/colonoscopy this morning to eval IDA with heme + stools  Eliquis on hold. HIGHER THAN BASELINE RISK.The nature of the procedure, as well as the risks, benefits, and alternatives were carefully and thoroughly reviewed with the patient. Ample time for discussion and questions allowed. The patient understood, was satisfied, and agreed to proceed.      Latest Ref Rng & Units 08/01/2022   12:41 AM 07/31/2022    7:33 PM 07/31/2022    4:47 AM  CBC  WBC 4.0 - 10.5 K/uL 7.7     Hemoglobin 13.0 - 17.0 g/dL 9.5  9.2  8.6   Hematocrit 39.0 - 52.0 % 31.0  30.2  28.4   Platelets 150 - 400 K/uL 198       08/01/2022 9:14 AM  Hinton Rao  has presented today for surgery, with the diagnosis of anemia.  FOBT +.  The various methods of treatment have been discussed with the patient and family. After consideration of risks, benefits and other options for treatment, the patient has consented to  Procedure(s): ESOPHAGOGASTRODUODENOSCOPY (EGD) WITH PROPOFOL (N/A) COLONOSCOPY (N/A) as a surgical intervention.  The patient's history has been reviewed, patient examined, no change in status, stable for surgery.  I have reviewed the patient's chart and labs.  Questions were answered to the patient's satisfaction.     Lajuan Lines Ashtan Girtman

## 2022-08-01 NOTE — Progress Notes (Signed)
PROGRESS NOTE    Ryan Garcia  DUK:[id number] DOB: 1933-11-09 DOA: 07/30/2022 PCP: Abner Greenspan, MD   Brief Narrative:  89 y.o. male with medical history significant for triple-vessel coronary artery disease status post PCI with stent placement, paroxysmal A-fib on Eliquis, chronic diastolic CHF, hypertension, hyperlipidemia, OSA on CPAP, right renal mass diagnosed 2 years ago, followed by urology presented with exertional chest pain.  On presentation, EKG did not show any evidence of acute ischemia but troponins were 93 and subsequently 425.  Hemoglobin was 8.7 (from baseline of 13.1).  Eliquis was held.  He was started on IV Protonix. CT chest abdomen and pelvis with contrast showed no evidence of active bleeding however showed 2.5 x 2.6 cm enhancing lesion in the anterior right upper kidney grossly unchanged from prior MRI compatible with solid renal neoplasm such as renal cell carcinoma.  Cardiology and GI were consulted.  Palliative care was also consulted for goals of care discussion.  Assessment & Plan:   Severe iron deficiency anemia Acute blood loss anemia, unclear source, no overt bleeding -On presentation, hemoglobin was 8.7 (from baseline of 13.1).  Has severe iron deficiency anemia.  Eliquis on hold. -On Protonix.  GI planning for EGD and colonoscopy today. -Hemoglobin 9.5 this morning.  Monitor -Will give IV iron   Non-STEMI/positive troponins History of CAD Hyperlipidemia Hypertension -Cardiology following and recommending conservative management and think that his presentation was secondary to demand ischemia from anemia.  Echo showed EF of 60 to 65%.  No need for IV heparin as per cardiology. -Continue beta-blocker, Imdur and statin.  Right renal mass concerning for renal cell carcinoma -Follows up with urology as an outpatient  Paroxysmal A-fib -Currently rate controlled.  Continue metoprolol.  Eliquis on hold.  AKI -Possibly from dehydration. -Baseline  creatinine of 1.1.  Presented with creatinine of 1.49.  Treated with IV fluids.  Improved.  Creatinine 1.16 today  Diabetes mellitus type 2 with hyperglycemia -A1c 7.8.  Continue CBGs with SSI  Chronic diastolic CHF -Currently compensated.  Strict input and output.  Daily weights.  Continue reduced dose of Imdur.  Continue metoprolol.  OSA -Continue CPAP nightly  Obesity -Outpatient follow-up  Goals of care -Palliative care has been consulted for goals of care discussion.  CODE STATUS has been changed to DNR with palliative care team  Physical deconditioning -PT eval   DVT prophylaxis: SCDs Code Status: DNR Family Communication: None at bedside Disposition Plan: Status is: Inpatient Remains inpatient appropriate because: Of severity of illness  Consultants: GI/cardiology/palliative  Procedures: Echo  Antimicrobials: None   Subjective: Patient seen and examined at bedside.  Hard of hearing, poor historian.  Denies any current chest pain.  No fever, vomiting, seizures reported.  Objective: Vitals:   08/01/22 0802 08/01/22 0900 08/01/22 1018 08/01/22 1030  BP:  (!) 149/96 136/64 (!) 111/48  Pulse:   61 68  Resp:  17 17 (!) 21  Temp: (!) 97.5 F (36.4 C) 97.7 F (36.5 C) 97.7 F (36.5 C)   TempSrc: Oral Tympanic Temporal   SpO2:  96% 99% 97%  Weight:      Height:        Intake/Output Summary (Last 24 hours) at 08/01/2022 1034 Last data filed at 08/01/2022 1009 Gross per 24 hour  Intake 700 ml  Output 575 ml  Net 125 ml   Filed Weights   07/31/22 1618  Weight: 112.2 kg    Examination:  General exam: Appears calm and comfortable.  Currently  on 3 L oxygen via nasal cannula.  Looks chronically ill and deconditioned.  Hard of hearing Respiratory system: Bilateral decreased breath sounds at bases with scattered crackles Cardiovascular system: S1 & S2 heard, Rate controlled Gastrointestinal system: Abdomen is nondistended, soft and nontender. Normal bowel  sounds heard. Extremities: No cyanosis, clubbing; trace lower extremity edema present  Central nervous system: Awake, slow to respond, poor historian.  No focal neurological deficits. Moving extremities Skin: No rashes, lesions or ulcers Psychiatry: Flat affect.  Not agitated.   Data Reviewed: I have personally reviewed following labs and imaging studies  CBC: Recent Labs  Lab 07/30/22 1655 07/31/22 0336 07/31/22 0447 07/31/22 1933 08/01/22 0041  WBC 6.8 6.0  --   --  7.7  NEUTROABS 5.2  --   --   --   --   HGB 8.7* 8.2* 8.6* 9.2* 9.5*  HCT 29.6* 27.0* 28.4* 30.2* 31.0*  MCV 84.6 83.6  --   --  81.8  PLT 205 178  --   --  601   Basic Metabolic Panel: Recent Labs  Lab 07/30/22 1655 07/31/22 0336 08/01/22 0041  NA 139 140 141  K 4.5 4.0 4.1  CL 107 106 109  CO2 '23 24 22  '$ GLUCOSE 321* 274* 186*  BUN '23 22 17  '$ CREATININE 1.49* 1.14 1.16  CALCIUM 8.4* 8.7* 9.0  MG  --  2.1 1.9  PHOS  --  3.2  --    GFR: Estimated Creatinine Clearance: 58.3 mL/min (by C-G formula based on SCr of 1.16 mg/dL). Liver Function Tests: Recent Labs  Lab 07/31/22 0336 08/01/22 0041  AST 23 24  ALT 16 18  ALKPHOS 75 78  BILITOT 0.4 0.9  PROT 5.8* 6.1*  ALBUMIN 3.1* 3.3*   No results for input(s): "LIPASE", "AMYLASE" in the last 168 hours. No results for input(s): "AMMONIA" in the last 168 hours. Coagulation Profile: Recent Labs  Lab 07/31/22 0130  INR 1.2   Cardiac Enzymes: No results for input(s): "CKTOTAL", "CKMB", "CKMBINDEX", "TROPONINI" in the last 168 hours. BNP (last 3 results) No results for input(s): "PROBNP" in the last 8760 hours. HbA1C: Recent Labs    07/31/22 0447  HGBA1C 7.8*   CBG: Recent Labs  Lab 07/31/22 1621 07/31/22 2001 07/31/22 2332 08/01/22 0320 08/01/22 0803  GLUCAP 164* 154* 192* 144* 123*   Lipid Profile: Recent Labs    07/31/22 0447  CHOL 86  HDL 29*  LDLCALC 38  TRIG 95  CHOLHDL 3.0   Thyroid Function Tests: No results for  input(s): "TSH", "T4TOTAL", "FREET4", "T3FREE", "THYROIDAB" in the last 72 hours. Anemia Panel: Recent Labs    07/31/22 0130 07/31/22 0220  VITAMINB12 692  --   FOLATE 11.8  --   FERRITIN 6*  --   TIBC 447  --   IRON 25*  --   RETICCTPCT  --  1.7   Sepsis Labs: Recent Labs  Lab 07/31/22 0130 07/31/22 0336  LATICACIDVEN 1.4 1.2    Recent Results (from the past 240 hour(s))  MRSA Next Gen by PCR, Nasal     Status: None   Collection Time: 07/31/22  4:30 PM   Specimen: Nasal Mucosa; Nasal Swab  Result Value Ref Range Status   MRSA by PCR Next Gen NOT DETECTED NOT DETECTED Final    Comment: (NOTE) The GeneXpert MRSA Assay (FDA approved for NASAL specimens only), is one component of a comprehensive MRSA colonization surveillance program. It is not intended to diagnose MRSA infection nor  to guide or monitor treatment for MRSA infections. Test performance is not FDA approved in patients less than 27 years old. Performed at [facility], [address], [place], [place] [address]          Radiology Studies: ECHOCARDIOGRAM COMPLETE  Result Date: 07/31/2022    ECHOCARDIOGRAM REPORT   Patient Name:   Ryan Garcia Date of Exam: 07/31/2022 Medical Rec #:  [id number]     Height:       71.0 in Accession #:    [id number]    Weight:       257.5 lb Date of Birth:  [birth year]     BSA:          2.348 m Patient Age:    12 years      BP:           151/68 mmHg Patient Gender: M             HR:           64 bpm. Exam Location:  Inpatient Procedure: 2D Echo, Cardiac Doppler and Color Doppler                       STAT ECHO Reported to: Dr Eleonore Chiquito on 07/31/2022 8:06:00 AM. Indications:    NSTEMI  History:        Patient has prior history of Echocardiogram examinations, most                 recent 10/14/2020. Previous Myocardial Infarction,                 Arrythmias:Atrial Fibrillation; Risk Factors:Diabetes,                 Hypertension and Sleep Apnea.  Sonographer:    Clayton Lefort RDCS  (AE) Referring Phys: [id number] [place]  1. Left ventricular ejection fraction, by estimation, is 60 to 65%. The left ventricle has normal function. The left ventricle has no regional wall motion abnormalities. There is mild concentric left ventricular hypertrophy. Left ventricular diastolic function could not be evaluated.  2. Right ventricular systolic function is normal. The right ventricular size is normal. There is mildly elevated pulmonary artery systolic pressure. The estimated right ventricular systolic pressure is 94.8 mmHg.  3. The mitral valve is grossly normal. No evidence of mitral valve regurgitation. No evidence of mitral stenosis.  4. The aortic valve is tricuspid. There is mild calcification of the aortic valve. There is mild thickening of the aortic valve. Aortic valve regurgitation is not visualized. Aortic valve sclerosis/calcification is present, without any evidence of aortic stenosis.  5. The inferior vena cava is normal in size with greater than 50% respiratory variability, suggesting right atrial pressure of 3 mmHg. Comparison(s): No significant change from prior study. FINDINGS  Left Ventricle: Left ventricular ejection fraction, by estimation, is 60 to 65%. The left ventricle has normal function. The left ventricle has no regional wall motion abnormalities. The left ventricular internal cavity size was normal in size. There is  mild concentric left ventricular hypertrophy. Left ventricular diastolic function could not be evaluated due to atrial fibrillation. Left ventricular diastolic function could not be evaluated. Right Ventricle: The right ventricular size is normal. No increase in right ventricular wall thickness. Right ventricular systolic function is normal. There is mildly elevated pulmonary artery systolic pressure. The tricuspid regurgitant velocity is 2.90  m/s, and with an assumed right atrial pressure of 3 mmHg,  the estimated right ventricular systolic pressure  is 67.5 mmHg. Left Atrium: Left atrial size was normal in size. Right Atrium: Right atrial size was normal in size. Pericardium: There is no evidence of pericardial effusion. Mitral Valve: The mitral valve is grossly normal. No evidence of mitral valve regurgitation. No evidence of mitral valve stenosis. Tricuspid Valve: The tricuspid valve is grossly normal. Tricuspid valve regurgitation is mild . No evidence of tricuspid stenosis. Aortic Valve: The aortic valve is tricuspid. There is mild calcification of the aortic valve. There is mild thickening of the aortic valve. Aortic valve regurgitation is not visualized. Aortic valve sclerosis/calcification is present, without any evidence of aortic stenosis. Aortic valve mean gradient measures 5.0 mmHg. Aortic valve peak gradient measures 9.7 mmHg. Aortic valve area, by VTI measures 2.75 cm. Pulmonic Valve: The pulmonic valve was grossly normal. Pulmonic valve regurgitation is not visualized. No evidence of pulmonic stenosis. Aorta: The aortic root and ascending aorta are structurally normal, with no evidence of dilitation. Venous: The inferior vena cava is normal in size with greater than 50% respiratory variability, suggesting right atrial pressure of 3 mmHg. IAS/Shunts: The atrial septum is grossly normal.  LEFT VENTRICLE PLAX 2D LVIDd:         4.80 cm LVIDs:         3.10 cm LV PW:         1.10 cm LV IVS:        1.20 cm LVOT diam:     2.10 cm LV SV:         93 LV SV Index:   40 LVOT Area:     3.46 cm  RIGHT VENTRICLE             IVC RV Basal diam:  3.20 cm     IVC diam: 1.40 cm RV S prime:     13.90 cm/s TAPSE (M-mode): 2.7 cm LEFT ATRIUM              Index        RIGHT ATRIUM           Index LA diam:        3.90 cm  1.66 cm/m   RA Area:     21.60 cm LA Vol (A2C):   104.0 ml 44.30 ml/m  RA Volume:   59.20 ml  25.22 ml/m LA Vol (A4C):   70.2 ml  29.90 ml/m LA Biplane Vol: 88.7 ml  37.78 ml/m  AORTIC VALVE AV Area (Vmax):    2.86 cm AV Area (Vmean):   2.80 cm  AV Area (VTI):     2.75 cm AV Vmax:           156.00 cm/s AV Vmean:          105.000 cm/s AV VTI:            0.338 m AV Peak Grad:      9.7 mmHg AV Mean Grad:      5.0 mmHg LVOT Vmax:         129.00 cm/s LVOT Vmean:        84.900 cm/s LVOT VTI:          0.268 m LVOT/AV VTI ratio: 0.79  AORTA Ao Root diam: 3.40 cm Ao Asc diam:  3.60 cm TRICUSPID VALVE TR Peak grad:   33.6 mmHg TR Vmax:        290.00 cm/s  SHUNTS Systemic VTI:  0.27 m Systemic Diam: 2.10 cm Eleonore Chiquito MD  Electronically signed by Eleonore Chiquito MD Signature Date/Time: 07/31/2022/8:20:37 AM    Final    CT CHEST ABDOMEN PELVIS W CONTRAST  Result Date: 07/31/2022 CLINICAL DATA:  Chest pain, known right renal mass, evaluate for metastatic disease EXAM: CT CHEST, ABDOMEN, AND PELVIS WITH CONTRAST TECHNIQUE: Multidetector CT imaging of the chest, abdomen and pelvis was performed following the standard protocol during bolus administration of intravenous contrast. RADIATION DOSE REDUCTION: This exam was performed according to the departmental dose-optimization program which includes automated exposure control, adjustment of the mA and/or kV according to patient size and/or use of iterative reconstruction technique. CONTRAST:  33m OMNIPAQUE IOHEXOL 350 MG/ML SOLN COMPARISON:  MRI abdomen dated 08/14/2021. CTA chest dated 10/12/2020. FINDINGS: CT CHEST FINDINGS Cardiovascular: Heart is normal in size.  No pericardial effusion. No evidence of thoracic aortic aneurysm. Atherosclerotic calcifications of the aortic arch. Severe three-vessel coronary atherosclerosis. Mediastinum/Nodes: 10 mm short axis right azygoesophageal recess node, previously 12 mm. Additional calcified right perihilar nodes. No suspicious mediastinal lymphadenopathy. Visualized thyroid is unremarkable. Lungs/Pleura: No suspicious pulmonary nodules. Calcified granuloma in the right lower lobe, benign. Mild subpleural reticulation/scarring in the lungs bilaterally, lower lobe predominant.  No pleural effusion or pneumothorax. Musculoskeletal: Moderate degenerative changes of the thoracic spine. No focal osseous lesions. CT ABDOMEN PELVIS FINDINGS Hepatobiliary: Liver is within normal limits. No suspicious/enhancing hepatic lesions. Layering small gallstones (series 3/image 66), without associated inflammatory changes. No intrahepatic or extrahepatic duct dilatation. Pancreas: Within normal limits. Spleen: Within normal limits. Adrenals/Urinary Tract: Adrenal glands are within normal limits. Multiple simple bilateral renal cysts, measuring up to 14.8 cm in the left upper kidney (series 3/image 68), benign (Bosniak I). No follow-up is recommended. 2.5 x 2.6 cm enhancing lesion in the anterior right upper kidney (series 3/image 70), grossly unchanged from prior MRI, compatible with solid renal neoplasm such as renal cell carcinoma. No hydronephrosis. Bladder is underdistended but unremarkable. Stomach/Bowel: Stomach is notable for a tiny hiatal hernia. No evidence of bowel obstruction. Normal appendix (series 3/image 98). Sigmoid diverticulosis, without evidence of diverticulitis. Vascular/Lymphatic: No evidence of abdominal aortic aneurysm. Atherosclerotic calcifications of the abdominal aorta and branch vessels. No suspicious abdominopelvic lymphadenopathy. Reproductive: Prostate is unremarkable. Other: No abdominopelvic ascites. Musculoskeletal: Mild anterior wedging at L1, chronic. Degenerative changes of the lumbar spine. No focal osseous lesions. IMPRESSION: 2.6 cm enhancing lesion in the anterior right upper kidney, grossly unchanged from prior MRI, compatible with solid renal neoplasm such as renal cell carcinoma. No findings suspicious for metastatic disease. Cholelithiasis, without associated inflammatory changes. Additional ancillary findings as above. Electronically Signed   By: SJulian HyM.D.   On: 07/31/2022 02:13   DG Chest 2 View  Result Date: 07/30/2022 CLINICAL DATA:  Chest  pain EXAM: CHEST - 2 VIEW COMPARISON:  10/11/2020 FINDINGS: Cardiac silhouette enlarged. No evidence of pneumothorax or pleural effusion. Pulmonary vascular congestion. No evidence of pulmonary edema. No focal consolidation. Aorta is calcified. There are thoracic degenerative changes. IMPRESSION: Enlarged cardiac silhouette.  Pulmonary vascular congestion. Electronically Signed   By: JSammie BenchM.D.   On: 07/30/2022 17:57        Scheduled Meds:  [MAR Hold] insulin aspart  0-15 Units Subcutaneous Q4H   [MAR Hold] isosorbide mononitrate  30 mg Oral Daily   [MAR Hold] metoprolol tartrate  12.5 mg Oral BID   [MAR Hold] pantoprazole  40 mg Oral Q0600   [MAR Hold] rosuvastatin  40 mg Oral Daily   Continuous Infusions:  Aline August, MD [facility] 08/01/2022, 10:34 AM

## 2022-08-01 NOTE — Consult Note (Signed)
Consult Note  MONTERIUS ROLF [birth year]  [id number].    Requesting MD: Dr. Hilarie Fredrickson Chief Complaint/Reason for Consult: colon mass concerning for malignancy  HPI:  89 y.o. male with medical history significant for CAD s/p PCI, afib on eliquis, CHF, HTN, HLD, OSA, right renal mass who presented to [facility] ED via EMS with chest pain. He was also found to have anemia with concern GI bleed with heme positive stool. He was admitted to the medicine service and GI and cardiology consulted. Palliative care also consulted. Eliquis held.  He was found to have NSTEMI related to demand ischemia in setting of anemia and eliquis held. Cardiology recommends medical therapy only for his CAD. Patient underwent colonoscopy and endoscopy by GI with polyps in proximal ascending, transverse (biopsied), and sigmoid (biopsied) colon and rectum and malignant tumor in mid ascending transverse colon (biopsied). Endoscopy with findings of erythema in duodenum which was biopsied.   CT ch/ab/pelvis completed with renal lesion but without findings suspicious for metastatic disease.  Today he denies abdominal pain. He has been tolerating a regular diet at home prior to admission. No nausea or emesis. Denies any recent constipation or bloody stools. Wife does states that he has been intermittently bloated over the last month. Denies any recent weight loss. Main complaint was increased fatigue over the last 2 weeks.  Substance use: none Blood thinners: eliquis baseline - last dose PTA Past Surgeries: no prior abdominal surgeries Lives at home with his wife and is fully independent. Ambulates without assistive device   ROS: ROS reviewed and negative excepts as above.  Family History  Problem Relation Age of Onset   Heart attack Brother    Colon cancer Neg Hx    Colon polyps Neg Hx    Stomach cancer Neg Hx    Esophageal cancer Neg Hx     Past Medical History:  Diagnosis Date   AC (acromioclavicular) joint bone  spurs    lt ankle   Allergy    allergic rhinitis   Anginal pain ([place])    Arthritis    OA   BPH (benign prostatic hyperplasia)    microwave tx of prostate   CAD (coronary artery disease)    a. s/p cath in 2012 showing 70-75% LAD stenosis, 75% D1 stenosis, 75% OM1, 60-70% RCA stenosis, and 80% PDA --> medical therapy pursued b. similar findings by cath in 2016; 06/2017 DES to distal RCA   Chronic kidney disease    Chronic upper back pain    Diabetes mellitus    type II x8 years   GERD (gastroesophageal reflux disease)    HLD (hyperlipidemia)    10/12: TC 208, TG 213, HDL 36, LDL 129   Hx of skin cancer, basal cell    many skin cancers removed/under constant treatment.   Hypertension    NSTEMI (non-ST elevated myocardial infarction) ([place]) 06/18/2017   DES to distal RCA   Obstructive sleep apnea on CPAP    Paroxysmal atrial flutter ([place])    Renal mass    Scoliosis     Past Surgical History:  Procedure Laterality Date   BREAST SURGERY  2009   breast lump removed benign   BUBBLE STUDY  10/14/2020   Procedure: BUBBLE STUDY;  Surgeon: [name], MD;  Location: [facility];  Service: Cardiovascular;;   CARDIAC CATHETERIZATION N/A 03/15/2015   Procedure: Left Heart Cath and Coronary Angiography;  Surgeon: Belva Crome, MD;  Location: [facility];  Service: Cardiovascular;  Laterality: N/A;   CARDIOVERSION N/A 10/14/2020   Procedure: CARDIOVERSION;  Surgeon: [name], MD;  Location: [facility] ENDOSCOPY;  Service: Cardiovascular;  Laterality: N/A;   CATARACT EXTRACTION W/PHACO Right 11/29/2021   Procedure: CATARACT EXTRACTION PHACO AND INTRAOCULAR LENS PLACEMENT ([place]) RIGHT DIABETIC;  Surgeon: Leandrew Koyanagi, MD;  Location: [facility];  Service: Ophthalmology;  Laterality: Right;  Diabetic 18.72 01:54.3   CORONARY STENT INTERVENTION N/A 06/18/2017   Procedure: CORONARY STENT INTERVENTION;  Surgeon: Jettie Booze, MD;  Location: [facility];  Service: Cardiovascular;  Laterality: N/A;  RCA   LEFT HEART CATH AND CORONARY ANGIOGRAPHY N/A 06/18/2017   Procedure: LEFT HEART CATH AND CORONARY ANGIOGRAPHY;  Surgeon: Jettie Booze, MD;  Location: [facility];  Service: Cardiovascular;  Laterality: N/A;   LEFT HEART CATH AND CORONARY ANGIOGRAPHY N/A 04/12/2020   Procedure: LEFT HEART CATH AND CORONARY ANGIOGRAPHY;  Surgeon: Martinique, Peter M, MD;  Location: [facility];  Service: Cardiovascular;  Laterality: N/A;   TEE WITHOUT CARDIOVERSION N/A 10/14/2020   Procedure: TRANSESOPHAGEAL ECHOCARDIOGRAM (TEE);  Surgeon: [name], MD;  Location: [facility];  Service: Cardiovascular;  Laterality: N/A;   ULTRASOUND GUIDANCE FOR VASCULAR ACCESS  06/18/2017   Procedure: Ultrasound Guidance For Vascular Access;  Surgeon: Jettie Booze, MD;  Location: [facility];  Service: Cardiovascular;;  right radial, right femoral    Social History:  reports that he has never smoked. He has never been exposed to tobacco smoke. He has never used smokeless tobacco. He reports that he does not drink alcohol and does not use drugs.  Allergies:  Allergies  Allergen Reactions   Loratadine Other (See Comments)    Not effective   Simvastatin Other (See Comments)    Intolerant- caused joint pain    Medications Prior to Admission  Medication Sig Dispense Refill   acetaminophen (TYLENOL) 325 MG tablet Take 325-650 mg by mouth every 6 (six) hours as needed for mild pain or headache.     apixaban (ELIQUIS) 5 MG TABS tablet Take 1 tablet (5 mg total) by mouth 2 (two) times daily. 180 tablet 1   Coenzyme Q10 (CO Q 10 PO) Take 200 mg by mouth daily.     Cyanocobalamin (VITAMIN B12) 1000 MCG TBCR Take 1,000 mcg by mouth daily. 30 tablet    fluticasone (FLONASE) 50 MCG/ACT nasal spray Place 1 spray into both nostrils daily as needed for allergies.      furosemide (LASIX) 20 MG tablet Take 20 mg by mouth daily.     glipiZIDE  (GLUCOTROL) 10 MG tablet TAKE 1 TABLET TWICE DAILY WITH MEALS (Patient taking differently: Take 10 mg by mouth 2 (two) times daily.) 180 tablet 1   insulin glargine (LANTUS SOLOSTAR) 100 UNIT/ML Solostar Pen Inject 50 Units into the skin at bedtime. (Patient taking differently: Inject 51 Units into the skin at bedtime.) 45 mL 3   isosorbide mononitrate (IMDUR) 60 MG 24 hr tablet TAKE 1 TABLET EVERY DAY (Patient taking differently: Take 60 mg by mouth daily.) 90 tablet 1   metFORMIN (GLUCOPHAGE) 1000 MG tablet TAKE 1 TABLET TWICE DAILY WITH MEALS (Patient taking differently: Take 1,000 mg by mouth 2 (two) times daily.) 180 tablet 2   metoprolol tartrate (LOPRESSOR) 25 MG tablet Take 0.5 tablets (12.5 mg total) by mouth 2 (two) times daily. 90 tablet 3   NOVOLOG FLEXPEN 100 UNIT/ML FlexPen Inject 16 Units into the skin daily. Before  dinner     rosuvastatin (CRESTOR) 40 MG tablet Take 1 tablet (40 mg total) by mouth daily. 90 tablet 1   Alcohol Swabs (B-D SINGLE USE SWABS REGULAR) PADS Use to check blood sugar 2 times a day 200 each 3   Blood Glucose Calibration (TRUE METRIX LEVEL 1) Low SOLN Use to check control on glucose meter 1 each 3   Blood Glucose Monitoring Suppl (TRUE METRIX AIR GLUCOSE METER) w/Device KIT 1 kit by Other route 2 (two) times daily. Check blood sugar twice daily and as directed. Dx E11.65 1 kit 0   DROPLET PEN NEEDLES 31G X 6 MM MISC USE ONE TIME DAILY  AT  BEDTIME  WITH  LANTUS 100 each 3   nitroGLYCERIN (NITROSTAT) 0.4 MG SL tablet Place 1 tablet (0.4 mg total) under the tongue every 5 (five) minutes as needed for chest pain. 25 tablet 3   TRUE METRIX BLOOD GLUCOSE TEST test strip CHECK BLOOD SUGAR TWICE DAILY 200 strip 3   TRUEplus Lancets 30G MISC Check blood sugar twice daily and as directed. Dx E11.65 200 each 3    Blood pressure (!) 122/108, pulse 66, temperature 97.7 F (36.5 C), temperature source Temporal, resp. rate 14, height '5\' 11"'$  (1.803 m), weight 112.2 kg, SpO2  97 %. Physical Exam: General: pleasant, WD, male who is laying in bed in NAD HEENT: head is normocephalic, atraumatic.  Sclera are noninjected.  Pupils equal and round. EOMs intact.  Ears and nose without any masses or lesions.  Mouth is pink and moist Heart: irregular Lungs: CTAB, no wheezes, rhonchi, or rales noted.  Respiratory effort nonlabored on room air Abd: protuberant but soft, NT, +BS, no masses, hernias, or organomegaly MSK: mild BLE edema Skin: warm and dry with no masses, lesions, or rashes Neuro: Cranial nerves 2-12 grossly intact, sensation is normal throughout Psych: A&Ox3 with an appropriate affect.    Results for orders placed or performed during the hospital encounter of 07/30/22 (from the past 48 hour(s))  Basic metabolic panel     Status: Abnormal   Collection Time: 07/30/22  4:55 PM  Result Value Ref Range   Sodium 139 135 - 145 mmol/L   Potassium 4.5 3.5 - 5.1 mmol/L   Chloride 107 98 - 111 mmol/L   CO2 23 22 - 32 mmol/L   Glucose, Bld 321 (H) 70 - 99 mg/dL    Comment: Glucose reference range applies only to samples taken after fasting for at least 8 hours.   BUN 23 8 - 23 mg/dL   Creatinine, Ser 1.49 (H) 0.61 - 1.24 mg/dL   Calcium 8.4 (L) 8.9 - 10.3 mg/dL   GFR, Estimated 45 (L) >60 mL/min    Comment: (NOTE) Calculated using the CKD-EPI Creatinine Equation (2021)    Anion gap 9 5 - 15    Comment: Performed at [facility] [address], [place], [place] [address]  Troponin I (High Sensitivity)     Status: Abnormal   Collection Time: 07/30/22  4:55 PM  Result Value Ref Range   Troponin I (High Sensitivity) 93 (H) <18 ng/L    Comment: (NOTE) Elevated high sensitivity troponin I (hsTnI) values and significant  changes across serial measurements may suggest ACS but many other  chronic and acute conditions are known to elevate hsTnI results.  Refer to the "Links" section for chest pain algorithms and additional  guidance. Performed at [facility], [place] [address], [place], [place] [address]  CBC with Differential     Status: Abnormal   Collection Time: 07/30/22  4:55 PM  Result Value Ref Range   WBC 6.8 4.0 - 10.5 K/uL   RBC 3.50 (L) 4.22 - 5.81 MIL/uL   Hemoglobin 8.7 (L) 13.0 - 17.0 g/dL   HCT 29.6 (L) 39.0 - 52.0 %   MCV 84.6 80.0 - 100.0 fL   MCH 24.9 (L) 26.0 - 34.0 pg   MCHC 29.4 (L) 30.0 - 36.0 g/dL   RDW 15.8 (H) 11.5 - 15.5 %   Platelets 205 150 - 400 K/uL   nRBC 0.0 0.0 - 0.2 %   Neutrophils Relative % 76 %   Neutro Abs 5.2 1.7 - 7.7 K/uL   Lymphocytes Relative 12 %   Lymphs Abs 0.8 0.7 - 4.0 K/uL   Monocytes Relative 10 %   Monocytes Absolute 0.7 0.1 - 1.0 K/uL   Eosinophils Relative 2 %   Eosinophils Absolute 0.1 0.0 - 0.5 K/uL   Basophils Relative 0 %   Basophils Absolute 0.0 0.0 - 0.1 K/uL   Immature Granulocytes 0 %   Abs Immature Granulocytes 0.02 0.00 - 0.07 K/uL    Comment: Performed at [facility], [address], [place], [place] [address]  ABO/Rh     Status: None   Collection Time: 07/30/22  4:55 PM  Result Value Ref Range   ABO/RH(D)      O NEG Performed at [facility] [address], [place], [place] [address]   Troponin I (High Sensitivity)     Status: Abnormal   Collection Time: 07/30/22  7:56 PM  Result Value Ref Range   Troponin I (High Sensitivity) 425 (HH) <18 ng/L    Comment: CRITICAL RESULT CALLED TO, READ BACK BY AND VERIFIED WITH B.SANGALANG,RN. 2126 07/30/22. LPAIT (NOTE) Elevated high sensitivity troponin I (hsTnI) values and significant  changes across serial measurements may suggest ACS but many other  chronic and acute conditions are known to elevate hsTnI results.  Refer to the "Links" section for chest pain algorithms and additional  guidance. Performed at [facility], [place] [address], [place], [place] [address]   Protime-INR     Status: Abnormal   Collection Time: 07/31/22  1:30 AM  Result Value Ref Range   Prothrombin Time 15.5 (H) 11.4  - 15.2 seconds   INR 1.2 0.8 - 1.2    Comment: (NOTE) INR goal varies based on device and disease states. Performed at [facility], [place] [address], [place], [place] [address]   Lactic Acid, Plasma     Status: None   Collection Time: 07/31/22  1:30 AM  Result Value Ref Range   Lactic Acid, Venous 1.4 0.5 - 1.9 mmol/L    Comment: Performed at [facility] [address], [place], [place] [address]  Vitamin B12     Status: None   Collection Time: 07/31/22  1:30 AM  Result Value Ref Range   Vitamin B-12 692 180 - 914 pg/mL    Comment: (NOTE) This assay is not validated for testing neonatal or myeloproliferative syndrome specimens for Vitamin B12 levels. Performed at [facility], [place] [address], [place], [place] [address]   Folate     Status: None   Collection Time: 07/31/22  1:30 AM  Result Value Ref Range   Folate 11.8 >5.9 ng/mL    Comment: Performed at [facility] [address] [place], [place]  [address]  Iron and TIBC     Status: Abnormal   Collection Time: 07/31/22  1:30 AM  Result Value Ref Range   Iron 25 (L) 45 - 182 ug/dL   TIBC 447 250 - 450 ug/dL   Saturation Ratios 6 (L) 17.9 - 39.5 %   UIBC 422 ug/dL    Comment: Performed at [facility], [place] [address], [place], [place] [address]  Ferritin     Status: Abnormal   Collection Time: 07/31/22  1:30 AM  Result Value Ref Range   Ferritin 6 (L) 24 - 336 ng/mL    Comment: Performed at [facility], [place] [address] [place], [place] [address]  Troponin I (High Sensitivity)     Status: Abnormal   Collection Time: 07/31/22  1:30 AM  Result Value Ref Range   Troponin I (High Sensitivity) 651 (HH) <18 ng/L    Comment: CRITICAL VALUE NOTED. VALUE IS CONSISTENT WITH PREVIOUSLY REPORTED/CALLED VALUE (NOTE) Elevated high sensitivity troponin I (hsTnI) values and significant  changes across serial measurements may suggest ACS but many other  chronic and acute conditions are known to  elevate hsTnI results.  Refer to the "Links" section for chest pain algorithms and additional  guidance. Performed at [facility], [place] [address] [place], [place] [address]   Type and screen [place]     Status: None   Collection Time: 07/31/22  1:56 AM  Result Value Ref Range   ABO/RH(D) O NEG    Antibody Screen NEG    Sample Expiration      08/03/2022,2359 Performed at [facility], [place] [address], [place], [place] [address]   Brain natriuretic peptide     Status: Abnormal   Collection Time: 07/31/22  2:20 AM  Result Value Ref Range   B Natriuretic Peptide 122.8 (H) 0.0 - 100.0 pg/mL    Comment: Performed at [facility] [address] [place], [place] [address]  Reticulocytes     Status: Abnormal   Collection Time: 07/31/22  2:20 AM  Result Value Ref Range   Retic Ct Pct 1.7 0.4 - 3.1 %   RBC. 3.14 (L) 4.22 - 5.81 MIL/uL   Retic Count, Absolute 54.6 19.0 - 186.0 K/uL   Immature Retic Fract 17.1 (H) 2.3 - 15.9 %    Comment: Performed at [facility] [address], [place], [place] [address]  Lactic acid, plasma     Status: None   Collection Time: 07/31/22  3:36 AM  Result Value Ref Range   Lactic Acid, Venous 1.2 0.5 - 1.9 mmol/L    Comment: Performed at [facility] [address], [place], [place] [address]  Comprehensive metabolic panel     Status: Abnormal   Collection Time: 07/31/22  3:36 AM  Result Value Ref Range   Sodium 140 135 - 145 mmol/L   Potassium 4.0 3.5 - 5.1 mmol/L   Chloride 106 98 - 111 mmol/L   CO2 24 22 - 32 mmol/L   Glucose, Bld 274 (H) 70 - 99 mg/dL    Comment: Glucose reference range applies only to samples taken after fasting for at least 8 hours.   BUN 22 8 - 23 mg/dL   Creatinine, Ser 1.14 0.61 - 1.24 mg/dL   Calcium 8.7 (L) 8.9 - 10.3 mg/dL   Total Protein 5.8 (L) 6.5 - 8.1 g/dL   Albumin 3.1 (L) 3.5 - 5.0 g/dL  AST 23 15 - 41 U/L   ALT 16 0 - 44 U/L   Alkaline Phosphatase 75 38 -  126 U/L   Total Bilirubin 0.4 0.3 - 1.2 mg/dL   GFR, Estimated >60 >60 mL/min    Comment: (NOTE) Calculated using the CKD-EPI Creatinine Equation (2021)    Anion gap 10 5 - 15    Comment: Performed at [facility] [address], [place], [place] [address]  CBC     Status: Abnormal   Collection Time: 07/31/22  3:36 AM  Result Value Ref Range   WBC 6.0 4.0 - 10.5 K/uL   RBC 3.23 (L) 4.22 - 5.81 MIL/uL   Hemoglobin 8.2 (L) 13.0 - 17.0 g/dL   HCT 27.0 (L) 39.0 - 52.0 %   MCV 83.6 80.0 - 100.0 fL   MCH 25.4 (L) 26.0 - 34.0 pg   MCHC 30.4 30.0 - 36.0 g/dL   RDW 15.8 (H) 11.5 - 15.5 %   Platelets 178 150 - 400 K/uL   nRBC 0.0 0.0 - 0.2 %    Comment: Performed at [facility], [place] [address], [place], [place] [address]  Magnesium     Status: None   Collection Time: 07/31/22  3:36 AM  Result Value Ref Range   Magnesium 2.1 1.7 - 2.4 mg/dL    Comment: Performed at [facility] [address] [place], [place] [address]  Phosphorus     Status: None   Collection Time: 07/31/22  3:36 AM  Result Value Ref Range   Phosphorus 3.2 2.5 - 4.6 mg/dL    Comment: Performed at [facility] [address] [place], [place] [address]  CBG monitoring, ED     Status: Abnormal   Collection Time: 07/31/22  4:04 AM  Result Value Ref Range   Glucose-Capillary 217 (H) 70 - 99 mg/dL    Comment: Glucose reference range applies only to samples taken after fasting for at least 8 hours.  Hemoglobin A1c     Status: Abnormal   Collection Time: 07/31/22  4:47 AM  Result Value Ref Range   Hgb A1c MFr Bld 7.8 (H) 4.8 - 5.6 %    Comment: (NOTE) Pre diabetes:          5.7%-6.4%  Diabetes:              >6.4%  Glycemic control for   <7.0% adults with diabetes    Mean Plasma Glucose 177.16 mg/dL    Comment: Performed at [facility] [address] [place], [place] [address]  Hemoglobin and hematocrit, blood     Status: Abnormal   Collection Time: 07/31/22  4:47 AM  Result  Value Ref Range   Hemoglobin 8.6 (L) 13.0 - 17.0 g/dL   HCT 28.4 (L) 39.0 - 52.0 %    Comment: Performed at [facility] [address], [place], [place] [address]  Lipid panel     Status: Abnormal   Collection Time: 07/31/22  4:47 AM  Result Value Ref Range   Cholesterol 86 0 - 200 mg/dL   Triglycerides 95 <150 mg/dL   HDL 29 (L) >40 mg/dL   Total CHOL/HDL Ratio 3.0 RATIO   VLDL 19 0 - 40 mg/dL   LDL Cholesterol 38 0 - 99 mg/dL    Comment:        Total Cholesterol/HDL:CHD Risk Coronary Heart Disease Risk Table  Men   Women  1/2 Average Risk   3.4   3.3  Average Risk       5.0   4.4  2 X Average Risk   9.6   7.1  3 X Average Risk  23.4   11.0        Use the calculated Patient Ratio above and the CHD Risk Table to determine the patient's CHD Risk.        ATP III CLASSIFICATION (LDL):  <100     mg/dL   Optimal  100-129  mg/dL   Near or Above                    Optimal  130-159  mg/dL   Borderline  160-189  mg/dL   High  >190     mg/dL   Very High Performed at [facility] [address], [place], [place] [address]   Troponin I (High Sensitivity)     Status: Abnormal   Collection Time: 07/31/22  4:47 AM  Result Value Ref Range   Troponin I (High Sensitivity) 609 (HH) <18 ng/L    Comment: CRITICAL VALUE NOTED. VALUE IS CONSISTENT WITH PREVIOUSLY REPORTED/CALLED VALUE (NOTE) Elevated high sensitivity troponin I (hsTnI) values and significant  changes across serial measurements may suggest ACS but many other  chronic and acute conditions are known to elevate hsTnI results.  Refer to the "Links" section for chest pain algorithms and additional  guidance. Performed at [facility], [place] [address] [place], [place] [address]   CBG monitoring, ED     Status: Abnormal   Collection Time: 07/31/22  8:08 AM  Result Value Ref Range   Glucose-Capillary 198 (H) 70 - 99 mg/dL    Comment: Glucose reference range applies only to samples taken  after fasting for at least 8 hours.  CBG monitoring, ED     Status: Abnormal   Collection Time: 07/31/22 12:54 PM  Result Value Ref Range   Glucose-Capillary 261 (H) 70 - 99 mg/dL    Comment: Glucose reference range applies only to samples taken after fasting for at least 8 hours.  Urinalysis, Routine w reflex microscopic -Urine, Unspecified Source     Status: Abnormal   Collection Time: 07/31/22  1:08 PM  Result Value Ref Range   Color, Urine STRAW (A) YELLOW   APPearance CLEAR CLEAR   Specific Gravity, Urine 1.012 1.005 - 1.030   pH 6.0 5.0 - 8.0   Glucose, UA >=500 (A) NEGATIVE mg/dL   Hgb urine dipstick NEGATIVE NEGATIVE   Bilirubin Urine NEGATIVE NEGATIVE   Ketones, ur NEGATIVE NEGATIVE mg/dL   Protein, ur 30 (A) NEGATIVE mg/dL   Nitrite NEGATIVE NEGATIVE   Leukocytes,Ua NEGATIVE NEGATIVE   RBC / HPF 0-5 0 - 5 RBC/hpf   WBC, UA 0-5 0 - 5 WBC/hpf   Bacteria, UA NONE SEEN NONE SEEN   Squamous Epithelial / HPF 0-5 0 - 5 /HPF    Comment: Performed at [facility], [address] [place], [place] [address]  Glucose, capillary     Status: Abnormal   Collection Time: 07/31/22  4:21 PM  Result Value Ref Range   Glucose-Capillary 164 (H) 70 - 99 mg/dL    Comment: Glucose reference range applies only to samples taken after fasting for at least 8 hours.  MRSA Next Gen by PCR, Nasal     Status: None   Collection Time: 07/31/22  4:30 PM  Specimen: Nasal Mucosa; Nasal Swab  Result Value Ref Range   MRSA by PCR Next Gen NOT DETECTED NOT DETECTED    Comment: (NOTE) The GeneXpert MRSA Assay (FDA approved for NASAL specimens only), is one component of a comprehensive MRSA colonization surveillance program. It is not intended to diagnose MRSA infection nor to guide or monitor treatment for MRSA infections. Test performance is not FDA approved in patients less than 58 years old. Performed at [facility], [place] [address] [place], [place] [address]   Hemoglobin and  hematocrit, blood     Status: Abnormal   Collection Time: 07/31/22  7:33 PM  Result Value Ref Range   Hemoglobin 9.2 (L) 13.0 - 17.0 g/dL   HCT 30.2 (L) 39.0 - 52.0 %    Comment: Performed at [facility] [address] [place], [place] [address]  Glucose, capillary     Status: Abnormal   Collection Time: 07/31/22  8:01 PM  Result Value Ref Range   Glucose-Capillary 154 (H) 70 - 99 mg/dL    Comment: Glucose reference range applies only to samples taken after fasting for at least 8 hours.   Comment 1 Document in Chart   Glucose, capillary     Status: Abnormal   Collection Time: 07/31/22 11:32 PM  Result Value Ref Range   Glucose-Capillary 192 (H) 70 - 99 mg/dL    Comment: Glucose reference range applies only to samples taken after fasting for at least 8 hours.   Comment 1 Document in Chart   CBC     Status: Abnormal   Collection Time: 08/01/22 12:41 AM  Result Value Ref Range   WBC 7.7 4.0 - 10.5 K/uL   RBC 3.79 (L) 4.22 - 5.81 MIL/uL   Hemoglobin 9.5 (L) 13.0 - 17.0 g/dL   HCT 31.0 (L) 39.0 - 52.0 %   MCV 81.8 80.0 - 100.0 fL   MCH 25.1 (L) 26.0 - 34.0 pg   MCHC 30.6 30.0 - 36.0 g/dL   RDW 15.9 (H) 11.5 - 15.5 %   Platelets 198 150 - 400 K/uL   nRBC 0.0 0.0 - 0.2 %    Comment: Performed at [facility] [address], [place], [place] [address]  Comprehensive metabolic panel     Status: Abnormal   Collection Time: 08/01/22 12:41 AM  Result Value Ref Range   Sodium 141 135 - 145 mmol/L   Potassium 4.1 3.5 - 5.1 mmol/L   Chloride 109 98 - 111 mmol/L   CO2 22 22 - 32 mmol/L   Glucose, Bld 186 (H) 70 - 99 mg/dL    Comment: Glucose reference range applies only to samples taken after fasting for at least 8 hours.   BUN 17 8 - 23 mg/dL   Creatinine, Ser 1.16 0.61 - 1.24 mg/dL   Calcium 9.0 8.9 - 10.3 mg/dL   Total Protein 6.1 (L) 6.5 - 8.1 g/dL   Albumin 3.3 (L) 3.5 - 5.0 g/dL   AST 24 15 - 41 U/L   ALT 18 0 - 44 U/L   Alkaline Phosphatase 78 38 - 126 U/L    Total Bilirubin 0.9 0.3 - 1.2 mg/dL   GFR, Estimated >60 >60 mL/min    Comment: (NOTE) Calculated using the CKD-EPI Creatinine Equation (2021)    Anion gap 10 5 - 15    Comment: Performed at [facility], [place] [address], [place], [place] [address]  Magnesium  Status: None   Collection Time: 08/01/22 12:41 AM  Result Value Ref Range   Magnesium 1.9 1.7 - 2.4 mg/dL    Comment: Performed at [facility], [place] [address] [place], [place] [address]  Glucose, capillary     Status: Abnormal   Collection Time: 08/01/22  3:20 AM  Result Value Ref Range   Glucose-Capillary 144 (H) 70 - 99 mg/dL    Comment: Glucose reference range applies only to samples taken after fasting for at least 8 hours.   Comment 1 Document in Chart   Glucose, capillary     Status: Abnormal   Collection Time: 08/01/22  8:03 AM  Result Value Ref Range   Glucose-Capillary 123 (H) 70 - 99 mg/dL    Comment: Glucose reference range applies only to samples taken after fasting for at least 8 hours.   Comment 1 Notify RN    Comment 2 Document in Chart    ECHOCARDIOGRAM COMPLETE  Result Date: 07/31/2022    ECHOCARDIOGRAM REPORT   Patient Name:   WARWICK NICK Date of Exam: 07/31/2022 Medical Rec #:  [id number]     Height:       71.0 in Accession #:    [id number]    Weight:       257.5 lb Date of Birth:  1934-05-13     BSA:          2.348 m Patient Age:    33 years      BP:           151/68 mmHg Patient Gender: M             HR:           64 bpm. Exam Location:  Inpatient Procedure: 2D Echo, Cardiac Doppler and Color Doppler                       STAT ECHO Reported to: Dr Eleonore Chiquito on 07/31/2022 8:06:00 AM. Indications:    NSTEMI  History:        Patient has prior history of Echocardiogram examinations, most                 recent 10/14/2020. Previous Myocardial Infarction,                 Arrythmias:Atrial Fibrillation; Risk Factors:Diabetes,                 Hypertension and Sleep Apnea.  Sonographer:    Clayton Lefort  RDCS (AE) Referring Phys: [id number] [place]  1. Left ventricular ejection fraction, by estimation, is 60 to 65%. The left ventricle has normal function. The left ventricle has no regional wall motion abnormalities. There is mild concentric left ventricular hypertrophy. Left ventricular diastolic function could not be evaluated.  2. Right ventricular systolic function is normal. The right ventricular size is normal. There is mildly elevated pulmonary artery systolic pressure. The estimated right ventricular systolic pressure is 78.4 mmHg.  3. The mitral valve is grossly normal. No evidence of mitral valve regurgitation. No evidence of mitral stenosis.  4. The aortic valve is tricuspid. There is mild calcification of the aortic valve. There is mild thickening of the aortic valve. Aortic valve regurgitation is not visualized. Aortic valve sclerosis/calcification is present, without any evidence of aortic stenosis.  5. The inferior vena cava is normal in size with greater than 50% respiratory variability, suggesting right atrial pressure of 3 mmHg. Comparison(s): No  significant change from prior study. FINDINGS  Left Ventricle: Left ventricular ejection fraction, by estimation, is 60 to 65%. The left ventricle has normal function. The left ventricle has no regional wall motion abnormalities. The left ventricular internal cavity size was normal in size. There is  mild concentric left ventricular hypertrophy. Left ventricular diastolic function could not be evaluated due to atrial fibrillation. Left ventricular diastolic function could not be evaluated. Right Ventricle: The right ventricular size is normal. No increase in right ventricular wall thickness. Right ventricular systolic function is normal. There is mildly elevated pulmonary artery systolic pressure. The tricuspid regurgitant velocity is 2.90  m/s, and with an assumed right atrial pressure of 3 mmHg, the estimated right ventricular systolic  pressure is 20.3 mmHg. Left Atrium: Left atrial size was normal in size. Right Atrium: Right atrial size was normal in size. Pericardium: There is no evidence of pericardial effusion. Mitral Valve: The mitral valve is grossly normal. No evidence of mitral valve regurgitation. No evidence of mitral valve stenosis. Tricuspid Valve: The tricuspid valve is grossly normal. Tricuspid valve regurgitation is mild . No evidence of tricuspid stenosis. Aortic Valve: The aortic valve is tricuspid. There is mild calcification of the aortic valve. There is mild thickening of the aortic valve. Aortic valve regurgitation is not visualized. Aortic valve sclerosis/calcification is present, without any evidence of aortic stenosis. Aortic valve mean gradient measures 5.0 mmHg. Aortic valve peak gradient measures 9.7 mmHg. Aortic valve area, by VTI measures 2.75 cm. Pulmonic Valve: The pulmonic valve was grossly normal. Pulmonic valve regurgitation is not visualized. No evidence of pulmonic stenosis. Aorta: The aortic root and ascending aorta are structurally normal, with no evidence of dilitation. Venous: The inferior vena cava is normal in size with greater than 50% respiratory variability, suggesting right atrial pressure of 3 mmHg. IAS/Shunts: The atrial septum is grossly normal.  LEFT VENTRICLE PLAX 2D LVIDd:         4.80 cm LVIDs:         3.10 cm LV PW:         1.10 cm LV IVS:        1.20 cm LVOT diam:     2.10 cm LV SV:         93 LV SV Index:   40 LVOT Area:     3.46 cm  RIGHT VENTRICLE             IVC RV Basal diam:  3.20 cm     IVC diam: 1.40 cm RV S prime:     13.90 cm/s TAPSE (M-mode): 2.7 cm LEFT ATRIUM              Index        RIGHT ATRIUM           Index LA diam:        3.90 cm  1.66 cm/m   RA Area:     21.60 cm LA Vol (A2C):   104.0 ml 44.30 ml/m  RA Volume:   59.20 ml  25.22 ml/m LA Vol (A4C):   70.2 ml  29.90 ml/m LA Biplane Vol: 88.7 ml  37.78 ml/m  AORTIC VALVE AV Area (Vmax):    2.86 cm AV Area (Vmean):    2.80 cm AV Area (VTI):     2.75 cm AV Vmax:           156.00 cm/s AV Vmean:          105.000 cm/s AV VTI:  0.338 m AV Peak Grad:      9.7 mmHg AV Mean Grad:      5.0 mmHg LVOT Vmax:         129.00 cm/s LVOT Vmean:        84.900 cm/s LVOT VTI:          0.268 m LVOT/AV VTI ratio: 0.79  AORTA Ao Root diam: 3.40 cm Ao Asc diam:  3.60 cm TRICUSPID VALVE TR Peak grad:   33.6 mmHg TR Vmax:        290.00 cm/s  SHUNTS Systemic VTI:  0.27 m Systemic Diam: 2.10 cm Eleonore Chiquito MD Electronically signed by Eleonore Chiquito MD Signature Date/Time: 07/31/2022/8:20:37 AM    Final    CT CHEST ABDOMEN PELVIS W CONTRAST  Result Date: 07/31/2022 CLINICAL DATA:  Chest pain, known right renal mass, evaluate for metastatic disease EXAM: CT CHEST, ABDOMEN, AND PELVIS WITH CONTRAST TECHNIQUE: Multidetector CT imaging of the chest, abdomen and pelvis was performed following the standard protocol during bolus administration of intravenous contrast. RADIATION DOSE REDUCTION: This exam was performed according to the departmental dose-optimization program which includes automated exposure control, adjustment of the mA and/or kV according to patient size and/or use of iterative reconstruction technique. CONTRAST:  90m OMNIPAQUE IOHEXOL 350 MG/ML SOLN COMPARISON:  MRI abdomen dated 08/14/2021. CTA chest dated 10/12/2020. FINDINGS: CT CHEST FINDINGS Cardiovascular: Heart is normal in size.  No pericardial effusion. No evidence of thoracic aortic aneurysm. Atherosclerotic calcifications of the aortic arch. Severe three-vessel coronary atherosclerosis. Mediastinum/Nodes: 10 mm short axis right azygoesophageal recess node, previously 12 mm. Additional calcified right perihilar nodes. No suspicious mediastinal lymphadenopathy. Visualized thyroid is unremarkable. Lungs/Pleura: No suspicious pulmonary nodules. Calcified granuloma in the right lower lobe, benign. Mild subpleural reticulation/scarring in the lungs bilaterally, lower lobe  predominant. No pleural effusion or pneumothorax. Musculoskeletal: Moderate degenerative changes of the thoracic spine. No focal osseous lesions. CT ABDOMEN PELVIS FINDINGS Hepatobiliary: Liver is within normal limits. No suspicious/enhancing hepatic lesions. Layering small gallstones (series 3/image 66), without associated inflammatory changes. No intrahepatic or extrahepatic duct dilatation. Pancreas: Within normal limits. Spleen: Within normal limits. Adrenals/Urinary Tract: Adrenal glands are within normal limits. Multiple simple bilateral renal cysts, measuring up to 14.8 cm in the left upper kidney (series 3/image 68), benign (Bosniak I). No follow-up is recommended. 2.5 x 2.6 cm enhancing lesion in the anterior right upper kidney (series 3/image 70), grossly unchanged from prior MRI, compatible with solid renal neoplasm such as renal cell carcinoma. No hydronephrosis. Bladder is underdistended but unremarkable. Stomach/Bowel: Stomach is notable for a tiny hiatal hernia. No evidence of bowel obstruction. Normal appendix (series 3/image 98). Sigmoid diverticulosis, without evidence of diverticulitis. Vascular/Lymphatic: No evidence of abdominal aortic aneurysm. Atherosclerotic calcifications of the abdominal aorta and branch vessels. No suspicious abdominopelvic lymphadenopathy. Reproductive: Prostate is unremarkable. Other: No abdominopelvic ascites. Musculoskeletal: Mild anterior wedging at L1, chronic. Degenerative changes of the lumbar spine. No focal osseous lesions. IMPRESSION: 2.6 cm enhancing lesion in the anterior right upper kidney, grossly unchanged from prior MRI, compatible with solid renal neoplasm such as renal cell carcinoma. No findings suspicious for metastatic disease. Cholelithiasis, without associated inflammatory changes. Additional ancillary findings as above. Electronically Signed   By: SJulian HyM.D.   On: 07/31/2022 02:13   DG Chest 2 View  Result Date: 07/30/2022 CLINICAL  DATA:  Chest pain EXAM: CHEST - 2 VIEW COMPARISON:  10/11/2020 FINDINGS: Cardiac silhouette enlarged. No evidence of pneumothorax or pleural effusion. Pulmonary vascular congestion. No  evidence of pulmonary edema. No focal consolidation. Aorta is calcified. There are thoracic degenerative changes. IMPRESSION: Enlarged cardiac silhouette.  Pulmonary vascular congestion. Electronically Signed   By: Sammie Bench M.D.   On: 07/30/2022 17:57      Assessment/Plan Ascending colon mass concerning for malignancy  Patient seen and examined and relevant labs and imaging reviewed.  Admitted with NSTEMI secondary to demand ischemia in the setting of GI bleed.  Underwent endoscopy and colonoscopy by GI today with findings of likely malignant mass in the mid ascending colon.  He also had polyps in proximal, transverse, sigmoid colon which were biopsied.  He is not obstructed.  CT chest abdomen pelvis done without findings of metastatic disease.  CEA in process.  Ultimately definitive management pending pathology would likely entail surgical resection.  Palliative care is also following in setting of his severe CAD for which cardiology has recommended medical management only. Cardiology does think he could tolerate surgery from a cardiac standpoint, but is still at least a moderate risk given age, anemia, CAD.  Patient is DNR/DNI. After long discussion of risks and benefits of surgical management he would like to pursue surgery at this time. Patient's wife and palliative medicine were present for this discussion. We will need to have pathology, specifically for the polyps, back prior to surgery. Please keep on clear liquids until surgery as he was prepped for his colonoscopy. I suspect his surgery will be as early as Friday.  FEN: CLD ID: none VTE: none in setting of ABLA  --Per primary-- NSTEMI - present on admission, likely secondary to demand ischemia. Cardiology following CAD s/p PCI afib on  eliquis CHF HTN HLD OSA right renal mass  I reviewed Consultant GI, Cardiology notes, hospitalist notes, last 24 h vitals and pain scores, last 48 h intake and output, last 24 h labs and trends, and last 24 h imaging results.   Margie Billet, PA-C [facility] 08/01/2022, 11:13 AM Please see Amion for pager number during day hours 7:00am-4:30pm

## 2022-08-01 NOTE — Transfer of Care (Signed)
Immediate Anesthesia Transfer of Care Note  Patient: Ryan Garcia  Procedure(s) Performed: ESOPHAGOGASTRODUODENOSCOPY (EGD) WITH PROPOFOL COLONOSCOPY BIOPSY SUBMUCOSAL TATTOO INJECTION POLYPECTOMY HEMOSTASIS CLIP PLACEMENT  Patient Location: PACU and Endoscopy Unit  Anesthesia Type:MAC  Level of Consciousness: patient cooperative and responds to stimulation  Airway & Oxygen Therapy: Patient Spontanous Breathing and Patient connected to nasal cannula oxygen  Post-op Assessment: Report given to RN and Post -op Vital signs reviewed and stable  Post vital signs: Reviewed and stable  Last Vitals:  Vitals Value Taken Time  BP 94/52 08/01/22 1104  Temp 36.5 C 08/01/22 1018  Pulse 59 08/01/22 1128  Resp 21 08/01/22 1128  SpO2 99 % 08/01/22 1128  Vitals shown include unvalidated device data.  Last Pain:  Vitals:   08/01/22 1040  TempSrc:   PainSc: 0-No pain         Complications: No notable events documented.

## 2022-08-01 NOTE — Anesthesia Postprocedure Evaluation (Signed)
Anesthesia Post Note  Patient: Ryan Garcia  Procedure(s) Performed: ESOPHAGOGASTRODUODENOSCOPY (EGD) WITH PROPOFOL COLONOSCOPY BIOPSY SUBMUCOSAL TATTOO INJECTION POLYPECTOMY HEMOSTASIS CLIP PLACEMENT     Patient location during evaluation: Endoscopy Anesthesia Type: MAC Level of consciousness: awake and alert Pain management: pain level controlled Vital Signs Assessment: post-procedure vital signs reviewed and stable Respiratory status: spontaneous breathing, nonlabored ventilation, respiratory function stable and patient connected to nasal cannula oxygen Cardiovascular status: blood pressure returned to baseline and stable Postop Assessment: no apparent nausea or vomiting Anesthetic complications: no  No notable events documented.  Last Vitals:  Vitals:   08/01/22 1030 08/01/22 1040  BP: (!) 111/48 (!) 122/108  Pulse: 68 66  Resp: (!) 21 14  Temp:    SpO2: 97% 97%    Last Pain:  Vitals:   08/01/22 1040  TempSrc:   PainSc: 0-No pain                 Barnet Glasgow

## 2022-08-01 NOTE — Plan of Care (Signed)

## 2022-08-01 NOTE — Op Note (Signed)
[facility] Patient Name: Ryan Garcia Procedure Date : 08/01/2022 MRN: [id number] Attending MD: Jerene Bears , MD, [id number] Date of Birth: 13-Sep-1933 CSN: [id number] Age: 89 Admit Type: Inpatient Procedure:                Colonoscopy Indications:              Heme positive stool, Iron deficiency anemia Providers:                Lajuan Lines. Hilarie Fredrickson, MD, Jeanella Cara, RN,                            William Dalton, Technician Referring MD:             [facility] Medicines:                Monitored Anesthesia Care Complications:            No immediate complications. Estimated Blood Loss:     Estimated blood loss was minimal. Procedure:                Pre-Anesthesia Assessment:                           - Prior to the procedure, a History and Physical                            was performed, and patient medications and                            allergies were reviewed. The patient's tolerance of                            previous anesthesia was also reviewed. The risks                            and benefits of the procedure and the sedation                            options and risks were discussed with the patient.                            All questions were answered, and informed consent                            was obtained. Prior Anticoagulants: The patient has                            taken Eliquis (apixaban), last dose was 2 days                            prior to procedure. ASA Grade Assessment: III - A                            patient with severe systemic disease. After  reviewing the risks and benefits, the patient was                            deemed in satisfactory condition to undergo the                            procedure.                           After obtaining informed consent, the colonoscope                            was passed under direct vision. Throughout the                             procedure, the patient's blood pressure, pulse, and                            oxygen saturations were monitored continuously. The                            CF-HQ190L ([id number]) Olympus coloscope was                            introduced through the anus and advanced to the                            terminal ileum. The colonoscopy was performed                            without difficulty. The patient tolerated the                            procedure well. The quality of the bowel                            preparation was good. The terminal ileum, ileocecal                            valve, appendiceal orifice, and rectum were                            photographed. Scope In: 9:42:16 AM Scope Out: 10:12:50 AM Scope Withdrawal Time: 0 hours 24 minutes 33 seconds  Total Procedure Duration: 0 hours 30 minutes 34 seconds  Findings:      The digital rectal exam was normal.      The terminal ileum appeared normal.      Two sessile polyps were found in the proximal ascending colon. The       polyps were 5 to 12 mm in size. Polypectomy was not attempted due to the       identification of cancer distal to these polyps (they will be resected       if patient proceeds with surgery).      A fungating non-obstructing large mass was found in the mid ascending  colon. The mass was partially circumferential (involving two-thirds of       the lumen circumference). The mass measured four cm in length. Oozing       was present on contact. This was biopsied with a cold forceps for       histology. Area distal to tumor was tattooed on opposite colonic walls       with injections totalling 4 mL of Spot (carbon black).      Three sessile polyps were found in the transverse colon. The polyps were       6 to 12 mm in size. These polyps were removed with a cold snare.       Resection and retrieval were complete.      Three sessile polyps were found in the sigmoid colon. The polyps were 6       to 10  mm in size. These polyps were removed with a cold snare. Resection       and retrieval were complete. For hemostasis at 1 polypectomy site, one       hemostatic clip was successfully placed (MR conditional). Clip       manufacturer: [employer]. There was no bleeding at the end of the       maneuver.      Three sessile polyps were found in the rectum. The polyps were 4 to 6 mm       in size. These polyps were removed with a cold snare. Resection and       retrieval were complete.      Multiple large-mouthed, medium-mouthed and small-mouthed diverticula       were found in the sigmoid colon, descending colon and ascending colon.      Internal hemorrhoids were found during retroflexion. The hemorrhoids       were small. Impression:               - The examined portion of the ileum was normal.                           - Two 5 to 12 mm polyps in the proximal ascending                            colon. Resection not attempted.                           - Malignant tumor in the mid ascending colon.                            Biopsied. Tattooed.                           - Three 6 to 12 mm polyps in the transverse colon,                            removed with a cold snare. Resected and retrieved.                           - Three 6 to 10 mm polyps in the sigmoid colon,  removed with a cold snare. Resected and retrieved.                            Clip (MR conditional) was placed. Clip                            manufacturer: [employer].                           - Three 4 to 6 mm polyps in the rectum, removed                            with a cold snare. Resected and retrieved.                           - Moderate diverticulosis in the sigmoid colon, in                            the descending colon and in the proximal ascending                            colon.                           - Small internal hemorrhoids. Moderate Sedation:       N/A Recommendation:           - Return patient to hospital ward for ongoing care.                           - Clear liquid diet (until evaluated by general                            surgery).                           - Continue present medications. Hold DOAC.                           - Await pathology results.                           - CT scan chest, abdomen and pelvis already done.                           - Check CEA.                           - General surgery consult.                           - Anticipate oncology consult as an outpatient. Procedure Code(s):        --- Professional ---                           [phone], Colonoscopy, flexible; with removal of  tumor(s), polyp(s), or other lesion(s) by snare                            technique                           45380, 59, Colonoscopy, flexible; with biopsy,                            single or multiple                           45381, Colonoscopy, flexible; with directed                            submucosal injection(s), any substance Diagnosis Code(s):        --- Professional ---                           K64.8, Other hemorrhoids                           C18.2, Malignant neoplasm of ascending colon                           D12.3, Benign neoplasm of transverse colon (hepatic                            flexure or splenic flexure)                           D12.5, Benign neoplasm of sigmoid colon                           D12.8, Benign neoplasm of rectum                           R19.5, Other fecal abnormalities                           D50.9, Iron deficiency anemia, unspecified                           K57.30, Diverticulosis of large intestine without                            perforation or abscess without bleeding CPT copyright 2022 [employer]. All rights reserved. The codes documented in this report are preliminary and upon coder review may  be revised to meet current  compliance requirements. Jerene Bears, MD 08/01/2022 10:25:12 AM This report has been signed electronically. Number of Addenda: 0

## 2022-08-02 ENCOUNTER — Encounter (HOSPITAL_COMMUNITY): Payer: Self-pay | Admitting: Internal Medicine

## 2022-08-02 ENCOUNTER — Other Ambulatory Visit: Payer: Self-pay

## 2022-08-02 DIAGNOSIS — Z515 Encounter for palliative care: Secondary | ICD-10-CM | POA: Diagnosis not present

## 2022-08-02 DIAGNOSIS — C182 Malignant neoplasm of ascending colon: Secondary | ICD-10-CM | POA: Diagnosis not present

## 2022-08-02 DIAGNOSIS — Z7189 Other specified counseling: Secondary | ICD-10-CM

## 2022-08-02 DIAGNOSIS — I214 Non-ST elevation (NSTEMI) myocardial infarction: Secondary | ICD-10-CM | POA: Diagnosis not present

## 2022-08-02 DIAGNOSIS — D500 Iron deficiency anemia secondary to blood loss (chronic): Secondary | ICD-10-CM | POA: Diagnosis not present

## 2022-08-02 DIAGNOSIS — C189 Malignant neoplasm of colon, unspecified: Secondary | ICD-10-CM

## 2022-08-02 LAB — CBC WITH DIFFERENTIAL/PLATELET
Abs Immature Granulocytes: 0.02 10*3/uL (ref 0.00–0.07)
Basophils Absolute: 0 10*3/uL (ref 0.0–0.1)
Basophils Relative: 0 %
Eosinophils Absolute: 0.1 10*3/uL (ref 0.0–0.5)
Eosinophils Relative: 1 %
HCT: 26.9 % — ABNORMAL LOW (ref 39.0–52.0)
Hemoglobin: 8.1 g/dL — ABNORMAL LOW (ref 13.0–17.0)
Immature Granulocytes: 0 %
Lymphocytes Relative: 13 %
Lymphs Abs: 1 10*3/uL (ref 0.7–4.0)
MCH: 24.9 pg — ABNORMAL LOW (ref 26.0–34.0)
MCHC: 30.1 g/dL (ref 30.0–36.0)
MCV: 82.8 fL (ref 80.0–100.0)
Monocytes Absolute: 0.7 10*3/uL (ref 0.1–1.0)
Monocytes Relative: 9 %
Neutro Abs: 6.1 10*3/uL (ref 1.7–7.7)
Neutrophils Relative %: 77 %
Platelets: 182 10*3/uL (ref 150–400)
RBC: 3.25 MIL/uL — ABNORMAL LOW (ref 4.22–5.81)
RDW: 15.9 % — ABNORMAL HIGH (ref 11.5–15.5)
WBC: 8 10*3/uL (ref 4.0–10.5)
nRBC: 0 % (ref 0.0–0.2)

## 2022-08-02 LAB — GLUCOSE, CAPILLARY
Glucose-Capillary: 157 mg/dL — ABNORMAL HIGH (ref 70–99)
Glucose-Capillary: 151 mg/dL — ABNORMAL HIGH (ref 70–99)
Glucose-Capillary: 177 mg/dL — ABNORMAL HIGH (ref 70–99)
Glucose-Capillary: 189 mg/dL — ABNORMAL HIGH (ref 70–99)
Glucose-Capillary: 264 mg/dL — ABNORMAL HIGH (ref 70–99)
Glucose-Capillary: 197 mg/dL — ABNORMAL HIGH (ref 70–99)

## 2022-08-02 LAB — BASIC METABOLIC PANEL
Anion gap: 6 (ref 5–15)
BUN: 13 mg/dL (ref 8–23)
CO2: 23 mmol/L (ref 22–32)
Calcium: 8.2 mg/dL — ABNORMAL LOW (ref 8.9–10.3)
Chloride: 107 mmol/L (ref 98–111)
Creatinine, Ser: 1.16 mg/dL (ref 0.61–1.24)
GFR, Estimated: >60 mL/min (ref >60)
Glucose, Bld: 183 mg/dL — ABNORMAL HIGH (ref 70–99)
Potassium: 3.9 mmol/L (ref 3.5–5.1)
Sodium: 136 mmol/L (ref 135–145)

## 2022-08-02 LAB — SURGICAL PATHOLOGY

## 2022-08-02 LAB — CEA: CEA: 6.1 ng/mL — ABNORMAL HIGH (ref 0.0–4.7)

## 2022-08-02 LAB — MAGNESIUM: Magnesium: 1.7 mg/dL (ref 1.7–2.4)

## 2022-08-02 MED ORDER — SODIUM CHLORIDE 0.9 % IV SOLN
2.0000 g | INTRAVENOUS | Status: AC
  Administered 2022-08-03: 2 g via INTRAVENOUS
  Filled 2022-08-02: qty 2

## 2022-08-02 MED ORDER — ALVIMOPAN 12 MG PO CAPS
12.0000 mg | ORAL_CAPSULE | ORAL | Status: AC
  Administered 2022-08-03: 12 mg via ORAL
  Filled 2022-08-02: qty 1

## 2022-08-02 MED ORDER — CHLORHEXIDINE GLUCONATE CLOTH 2 % EX PADS
6.0000 | MEDICATED_PAD | Freq: Once | CUTANEOUS | Status: AC
  Administered 2022-08-03: 6 via TOPICAL

## 2022-08-02 MED ORDER — CHLORHEXIDINE GLUCONATE CLOTH 2 % EX PADS
6.0000 | MEDICATED_PAD | Freq: Once | CUTANEOUS | Status: AC
  Administered 2022-08-02: 6 via TOPICAL

## 2022-08-02 NOTE — Plan of Care (Signed)
Problem: Education: Goal: Ability to describe self-care measures that may prevent or decrease complications (Diabetes Survival Skills Education) will improve 08/02/2022 2059 by Chalene Treu, Kristopher Glee, RN Outcome: Progressing 08/02/2022 2058 by Roslyn Smiling, RN Outcome: Progressing Goal: Individualized Educational Video(s) Outcome: Progressing   Problem: Coping: Goal: Ability to adjust to condition or change in health will improve 08/02/2022 2059 by Layton Naves, Kristopher Glee, RN Outcome: Progressing 08/02/2022 2058 by Roslyn Smiling, RN Outcome: Progressing   Problem: Fluid Volume: Goal: Ability to maintain a balanced intake and output will improve 08/02/2022 2059 by Canisha Issac, Kristopher Glee, RN Outcome: Progressing 08/02/2022 2058 by Roslyn Smiling, RN Outcome: Progressing   Problem: Health Behavior/Discharge Planning: Goal: Ability to identify and utilize available resources and services will improve 08/02/2022 2059 by Roslyn Smiling, RN Outcome: Progressing 08/02/2022 2058 by Roslyn Smiling, RN Outcome: Progressing Goal: Ability to manage health-related needs will improve 08/02/2022 2059 by Karron Alvizo, Kristopher Glee, RN Outcome: Progressing 08/02/2022 2058 by Roslyn Smiling, RN Outcome: Progressing   Problem: Metabolic: Goal: Ability to maintain appropriate glucose levels will improve 08/02/2022 2059 by Roslyn Smiling, RN Outcome: Progressing 08/02/2022 2058 by Roslyn Smiling, RN Outcome: Progressing   Problem: Nutritional: Goal: Maintenance of adequate nutrition will improve 08/02/2022 2059 by Roslyn Smiling, RN Outcome: Progressing 08/02/2022 2058 by Roslyn Smiling, RN Outcome: Progressing Goal: Progress toward achieving an optimal weight will improve 08/02/2022 2059 by Roslyn Smiling, RN Outcome: Progressing 08/02/2022 2058 by Roslyn Smiling, RN Outcome: Progressing   Problem: Skin Integrity: Goal: Risk for impaired skin integrity will decrease 08/02/2022 2059  by Roslyn Smiling, RN Outcome: Progressing 08/02/2022 2058 by Roslyn Smiling, RN Outcome: Progressing   Problem: Tissue Perfusion: Goal: Adequacy of tissue perfusion will improve 08/02/2022 2059 by Fraya Ueda, Kristopher Glee, RN Outcome: Progressing 08/02/2022 2058 by Roslyn Smiling, RN Outcome: Progressing   Problem: Education: Goal: Knowledge of General Education information will improve Description: Including pain rating scale, medication(s)/side effects and non-pharmacologic comfort measures Outcome: Progressing   Problem: Health Behavior/Discharge Planning: Goal: Ability to manage health-related needs will improve Outcome: Progressing   Problem: Clinical Measurements: Goal: Ability to maintain clinical measurements within normal limits will improve 08/02/2022 2059 by Dorinda Stehr, Kristopher Glee, RN Outcome: Progressing 08/02/2022 2058 by Roslyn Smiling, RN Outcome: Progressing Goal: Will remain free from infection 08/02/2022 2059 by Roslyn Smiling, RN Outcome: Progressing 08/02/2022 2058 by Roslyn Smiling, RN Outcome: Progressing Goal: Diagnostic test results will improve 08/02/2022 2059 by Roslyn Smiling, RN Outcome: Progressing 08/02/2022 2058 by Roslyn Smiling, RN Outcome: Progressing Goal: Respiratory complications will improve 08/02/2022 2059 by Roslyn Smiling, RN Outcome: Progressing 08/02/2022 2058 by Roslyn Smiling, RN Outcome: Progressing Goal: Cardiovascular complication will be avoided 08/02/2022 2059 by Roslyn Smiling, RN Outcome: Progressing 08/02/2022 2058 by Roslyn Smiling, RN Outcome: Progressing   Problem: Activity: Goal: Risk for activity intolerance will decrease 08/02/2022 2059 by Ayden Apodaca, Kristopher Glee, RN Outcome: Progressing 08/02/2022 2058 by Roslyn Smiling, RN Outcome: Progressing   Problem: Nutrition: Goal: Adequate nutrition will be maintained 08/02/2022 2059 by Roslyn Smiling, RN Outcome: Progressing 08/02/2022 2058 by  Roslyn Smiling, RN Outcome: Progressing   Problem: Coping: Goal: Level of anxiety will decrease 08/02/2022 2059 by Roslyn Smiling, RN Outcome: Progressing 08/02/2022 2058 by Roslyn Smiling, RN Outcome: Progressing   Problem: Elimination: Goal: Will not experience complications related to bowel motility 08/02/2022 2059 by Lasaundra Riche, Kristopher Glee, RN Outcome:  Progressing 08/02/2022 2058 by Roslyn Smiling, RN Outcome: Progressing Goal: Will not experience complications related to urinary retention 08/02/2022 2059 by [place] Frieden, Kristopher Glee, RN Outcome: Progressing 08/02/2022 2058 by Roslyn Smiling, RN Outcome: Progressing   Problem: Pain Managment: Goal: General experience of comfort will improve 08/02/2022 2059 by Roslyn Smiling, RN Outcome: Progressing 08/02/2022 2058 by Roslyn Smiling, RN Outcome: Progressing   Problem: Safety: Goal: Ability to remain free from injury will improve 08/02/2022 2059 by Samya Siciliano, Kristopher Glee, RN Outcome: Progressing 08/02/2022 2058 by Roslyn Smiling, RN Outcome: Progressing   Problem: Skin Integrity: Goal: Risk for impaired skin integrity will decrease 08/02/2022 2059 by Roslyn Smiling, RN Outcome: Progressing 08/02/2022 2058 by Roslyn Smiling, RN Outcome: Progressing

## 2022-08-02 NOTE — Hospital Course (Addendum)
89 y.o. male with medical history significant for triple-vessel coronary artery disease status post PCI with stent placement, paroxysmal A-fib on Eliquis, chronic diastolic CHF, hypertension, hyperlipidemia, OSA on CPAP, right renal mass diagnosed 2 years ago, followed by urology presented with exertional chest pain.  On presentation, EKG did not show any evidence of acute ischemia but troponins were 93 and subsequently 425.  Hemoglobin was 8.7 (from baseline of 13.1).  Eliquis was held.  He was started on IV Protonix. CT chest abdomen and pelvis with contrast showed no evidence of active bleeding however showed 2.5 x 2.6 cm enhancing lesion in the anterior right upper kidney grossly unchanged from prior MRI compatible with solid renal neoplasm such as renal cell carcinoma.  Cardiology and GI were consulted.  Palliative care was also consulted for goals of care discussion.  1/31: Underwent EGD that was normal biopsy obtained,Colonoscopy done: Malignant tumor in the mid descending colon biopsied, had polyps in transverse colon and sigmoid colon,CEA  and surgery consult obtained.  Patient had CT chest abdomen pelvis showing renal lesion but without suspicion for metastatic disease.  Palliative care consulted> patient interested to pursue surgical option.  Surgery plans to await pathology results and consider surgery, also feels with recent NSTEMI high risk for cardiac complication and also bleeding risk from tumor

## 2022-08-02 NOTE — Progress Notes (Signed)
PROGRESS NOTE Ryan Garcia  KZS:[id number] DOB: Aug 31, 1933 DOA: 07/30/2022 PCP: Abner Greenspan, MD   Brief Narrative/Hospital Course: 89 y.o. male with medical history significant for triple-vessel coronary artery disease status post PCI with stent placement, paroxysmal A-fib on Eliquis, chronic diastolic CHF, hypertension, hyperlipidemia, OSA on CPAP, right renal mass diagnosed 2 years ago, followed by urology presented with exertional chest pain.  On presentation, EKG did not show any evidence of acute ischemia but troponins were 93 and subsequently 425.  Hemoglobin was 8.7 (from baseline of 13.1).  Eliquis was held.  He was started on IV Protonix. CT chest abdomen and pelvis with contrast showed no evidence of active bleeding however showed 2.5 x 2.6 cm enhancing lesion in the anterior right upper kidney grossly unchanged from prior MRI compatible with solid renal neoplasm such as renal cell carcinoma.  Cardiology and GI were consulted.  Palliative care was also consulted for goals of care discussion.  1/31: Underwent EGD that was normal biopsy obtained,Colonoscopy done: Malignant tumor in the mid descending colon biopsied, had polyps in transverse colon and sigmoid colon,CEA  and surgery consult obtained.  Patient had CT chest abdomen pelvis showing renal lesion but without suspicion for metastatic disease.  Palliative care consulted> patient interested to pursue surgical option.  Surgery plans to await pathology results and consider surgery, also feels with recent NSTEMI high risk for cardiac complication and also bleeding risk from tumor    Subjective: Seen examined. Wife is at the bedside Overnight Tmax 100.2, used CPAP last night Labs reviewed stable BMP, CBC with hemoglobin 8.1 Having his meal.   Assessment and Plan: Principal Problem:   NSTEMI (non-ST elevated myocardial infarction) ([place]) Active Problems:   Ascending colon malignant neoplasm (HCC)   Benign neoplasm of transverse colon    Benign neoplasm of sigmoid colon   Benign neoplasm of rectum   Duodenitis   Large right colon mass concerning for malignancy:S/p egs/ colo 1/31:EGD  normal ,Colonoscopy: Malignant tumor in the mid descending colon biopsied, had polyps in transverse colon and sigmoid colon,.CT chest abdomen pelvis showing renal lesion but without suspicion for metastatic disease.  CEA 6.1.  Palliative care consulted> patient interested to pursue surgical option.  Surgery plans to await pathology results and consider surgery, also feels with recent NSTEMI high risk for cardiac complication and also bleeding risk from tumor.  For now continue to hold Eliquis.  NSTEMI type II/demand ischemia: Cardiology on board troponin 93 peaked to 651> 609 flat trend EKG without significant ischemia, chest pain on admission.  Per cardiology likely demand ischemia in the setting of anemia with known CAD on  prior cath severe diffuse LAD and LCx disease.  Echo with EF 60 to 65%, no RWMA, due to anemia not pursuing invasive workup, keeping up anticoagulation on metoprolol statin and Imdur.  Iron deficiency anemia: Likely due to #1.  Status post IV iron monitor hemoglobin transfuse for less than 7 g Recent Labs  Lab 07/31/22 0336 07/31/22 0447 07/31/22 1933 08/01/22 0041 08/02/22 0039  HGB 8.2* 8.6* 9.2* 9.5* 8.1*  HCT 27.0* 28.4* 30.2* 31.0* 26.9*    Chronic diastolic DDU:[id number] monitor intake output Daily weight continue Imdur and metoprolol. Acute kidney injury likely from dehydration baseline 1.1 presently 1.49 improved Paroxysmal atrial fibrillation: Eliquis on hold. Hypertension: BP stable, continue Imdur and metoprolol. HLD continue statin Right renal mass concerning for renal cell carcinoma follow-up with urology as outpatient OSA continue CPAP nightly Type 2 diabetes mellitus: A1c 7.8 blood sugar  stable on SSI Recent Labs  Lab 07/31/22 0447 07/31/22 0808 08/01/22 1920 08/01/22 2319 08/02/22 0405  08/02/22 0723 08/02/22 1107  GLUCAP  --    < > 191* 155* 157* 151* 177*  HGBA1C 7.8*  --   --   --   --   --   --    < > = values in this interval not displayed.    Class I obesity advised weight loss Physical deconditioning continue PT OT Goals of care palliative care following currently DNR  DVT prophylaxis: scd, Eliquis on hold Code Status:   Code Status: DNR Family Communication: plan of care discussed with patient/wife at bedside. Patient status is: Inpatient because of colon cancer Level of care: Progressive   Dispo: The patient is from: [place] w/ wife            Anticipated disposition: TBD >2-3 DAYS Objective: Vitals last 24 hrs: Vitals:   08/02/22 0721 08/02/22 0819 08/02/22 1106 08/02/22 1216  BP: 114/75 (!) 113/94 (!) 121/52   Pulse: 65 69 66 (!) 59  Resp: '18  17 19  '$ Temp: 98.5 F (36.9 C)  98.3 F (36.8 C)   TempSrc: Oral  Oral   SpO2: 95%  96% 98%  Weight:      Height:       Weight change: -1.12 kg  Physical Examination: General exam: alert awake, older than stated age HEENT:Oral mucosa moist, Ear/Nose WNL grossly Respiratory system: bilaterally clear BS, no use of accessory muscle Cardiovascular system: S1 & S2 +, No JVD. Gastrointestinal system: Abdomen soft,NT,ND, BS+ Nervous System:Alert, awake, moving extremities. Extremities: LE edema mild,distal peripheral pulses palpable.  Skin: No rashes,no icterus. MSK: Normal muscle bulk,tone, power  Medications reviewed:  Scheduled Meds:  feeding supplement  1 Container Oral TID BM   insulin aspart  0-15 Units Subcutaneous Q4H   isosorbide mononitrate  30 mg Oral Daily   metoprolol tartrate  12.5 mg Oral BID   pantoprazole  40 mg Oral Q0600   rosuvastatin  40 mg Oral Daily   Continuous Infusions:  ferric gluconate (FERRLECIT) IVPB 250 mg (08/02/22 1136)    Diet Order             Diet NPO time specified  Diet effective midnight           Diet clear liquid Room service appropriate? Yes; Fluid  consistency: Thin  Diet effective now                  Intake/Output Summary (Last 24 hours) at 08/02/2022 1339 Last data filed at 08/02/2022 6283 Gross per 24 hour  Intake 871.23 ml  Output 1175 ml  Net -303.77 ml   Net IO Since Admission: -838.77 mL [08/02/22 1339]  Wt Readings from Last 3 Encounters:  08/02/22 111.1 kg  07/05/22 116.8 kg  07/03/22 114.8 kg     Unresulted Labs (From admission, onward)     Start     Ordered   08/03/22 0500  CBC  Tomorrow morning,   R       Question:  Specimen collection method  Answer:  Lab=Lab collect   08/02/22 1101   08/03/22 1517  Basic metabolic panel  Tomorrow morning,   R       Question:  Specimen collection method  Answer:  Lab=Lab collect   08/02/22 1101   08/03/22 0500  Magnesium  Tomorrow morning,   R       Question:  Specimen collection method  Answer:  Lab=Lab collect   08/02/22 1101   07/31/22 0000  Type and screen  R       Comments: [place]    07/31/22 0134           Data Reviewed: I have personally reviewed following labs and imaging studies CBC: Recent Labs  Lab 07/30/22 1655 07/31/22 0336 07/31/22 0447 07/31/22 1933 08/01/22 0041 08/02/22 0039  WBC 6.8 6.0  --   --  7.7 8.0  NEUTROABS 5.2  --   --   --   --  6.1  HGB 8.7* 8.2* 8.6* 9.2* 9.5* 8.1*  HCT 29.6* 27.0* 28.4* 30.2* 31.0* 26.9*  MCV 84.6 83.6  --   --  81.8 82.8  PLT 205 178  --   --  198 829   Basic Metabolic Panel: Recent Labs  Lab 07/30/22 1655 07/31/22 0336 08/01/22 0041 08/02/22 0039  NA 139 140 141 136  K 4.5 4.0 4.1 3.9  CL 107 106 109 107  CO2 '23 24 22 23  '$ GLUCOSE 321* 274* 186* 183*  BUN '23 22 17 13  '$ CREATININE 1.49* 1.14 1.16 1.16  CALCIUM 8.4* 8.7* 9.0 8.2*  MG  --  2.1 1.9 1.7  PHOS  --  3.2  --   --    GFR: Estimated Creatinine Clearance: 57.9 mL/min (by C-G formula based on SCr of 1.16 mg/dL). Liver Function Tests: Recent Labs  Lab 07/31/22 0336 08/01/22 0041  AST 23 24  ALT 16 18   ALKPHOS 75 78  BILITOT 0.4 0.9  PROT 5.8* 6.1*  ALBUMIN 3.1* 3.3*   No results for input(s): "LIPASE", "AMYLASE" in the last 168 hours. No results for input(s): "AMMONIA" in the last 168 hours. Coagulation Profile: Recent Labs  Lab 07/31/22 0130  INR 1.2   BNP (last 3 results) No results for input(s): "PROBNP" in the last 8760 hours. HbA1C: Recent Labs    07/31/22 0447  HGBA1C 7.8*   CBG: Recent Labs  Lab 08/01/22 1920 08/01/22 2319 08/02/22 0405 08/02/22 0723 08/02/22 1107  GLUCAP 191* 155* 157* 151* 177*   Lipid Profile: Recent Labs    07/31/22 0447  CHOL 86  HDL 29*  LDLCALC 38  TRIG 95  CHOLHDL 3.0   Thyroid Function Tests: No results for input(s): "TSH", "T4TOTAL", "FREET4", "T3FREE", "THYROIDAB" in the last 72 hours. Sepsis Labs: Recent Labs  Lab 07/31/22 0130 07/31/22 0336  LATICACIDVEN 1.4 1.2    Recent Results (from the past 240 hour(s))  MRSA Next Gen by PCR, Nasal     Status: None   Collection Time: 07/31/22  4:30 PM   Specimen: Nasal Mucosa; Nasal Swab  Result Value Ref Range Status   MRSA by PCR Next Gen NOT DETECTED NOT DETECTED Final    Comment: (NOTE) The GeneXpert MRSA Assay (FDA approved for NASAL specimens only), is one component of a comprehensive MRSA colonization surveillance program. It is not intended to diagnose MRSA infection nor to guide or monitor treatment for MRSA infections. Test performance is not FDA approved in patients less than 11 years old. Performed at [facility], [place] [address] [place], [place] [address]     Antimicrobials: Anti-infectives (From admission, onward)    None      Culture/Microbiology    Component Value Date/Time   SDES URINE, RANDOM 04/14/2020 2022   SPECREQUEST NONE 04/14/2020 2022   CULT (A) 04/14/2020 2022    <10,000 COLONIES/mL INSIGNIFICANT GROWTH Performed at [facility]  Lab, [address] [place],  [address]    REPTSTATUS 04/16/2020 FINAL 04/14/2020  2022  Radiology Studies: No results found.   LOS: 2 days   Antonieta Pert, MD [facility]  08/02/2022, 1:39 PM

## 2022-08-02 NOTE — Evaluation (Signed)
Physical Therapy Evaluation Patient Details Name: Ryan Garcia MRN: [id number] DOB: Jul 19, 1933 Today's Date: 08/02/2022  History of Present Illness  Pt is an 89 y.o. male who presented 07/30/22 with exertional chest pain and found to have elevated troponin and anemia. Pt admitted with NSTEMI and found ascending colon mass concerning for malignancy s/p endoscopy and colonoscopy 1/31. PMH: arthritis, CAD, CKD, DM, GERD, HLD, hx of skin cancer, HTN, NSTEMI, paroxysmal atrial flutter, renal mass, scoliosis   Clinical Impression  Pt presents with condition above and deficits mentioned below, see PT Problem List. PTA, he was independent without DME, living with his wife in a house with 4-5 STE and with laundry in the basement that has a stair lift for access. Currently, pt is demonstrating deficits in lower extremity strength, balance, and activity tolerance/endurance. He did display x1 LOB when turning during a gait bout, needing minA to recover, but otherwise was able to ambulate without UE support at a min guard assist level. He does not seem to be far from his baseline currently. Pt declined desire to have follow-up with HH or OP PT upon d/c at this time, but discussed needing to re-assess his functional mobility after his planned surgery and may need to update recommendations at that time. Pt agreeable to this plan. Will continue to follow acutely.     Recommendations for follow up therapy are one component of a multi-disciplinary discharge planning process, led by the attending physician.  Recommendations may be updated based on patient status, additional functional criteria and insurance authorization.  Follow Up Recommendations No PT follow up (pending progress and presentation after surgery)      Assistance Recommended at Discharge PRN  Patient can return home with the following  Assistance with cooking/housework;Assist for transportation;Help with stairs or ramp for entrance    Equipment  Recommendations Other (comment) (TBA after surgery)  Recommendations for Other Services       Functional Status Assessment Patient has had a recent decline in their functional status and demonstrates the ability to make significant improvements in function in a reasonable and predictable amount of time.     Precautions / Restrictions Precautions Precautions: Fall Restrictions Weight Bearing Restrictions: No      Mobility  Bed Mobility               General bed mobility comments: Pt sitting up in chair upon arrival.    Transfers Overall transfer level: Needs assistance Equipment used: None Transfers: Sit to/from Stand Sit to Stand: Supervision           General transfer comment: Supervision for safety coming to stand from recliner, no LOB    Ambulation/Gait Ambulation/Gait assistance: Min guard, Min assist Gait Distance (Feet): 400 Feet Assistive device: None Gait Pattern/deviations: Step-through pattern, Decreased stride length Gait velocity: reduced Gait velocity interpretation: 1.31 - 2.62 ft/sec, indicative of limited community ambulator   General Gait Details: Pt with slowed, step-through gait pattern and decreased stride length. He did display some bil lower extremity weakness/instability with some knee bouncing but no appreciative knee buckling. x1 minor LOB when turning, needing minA to recover, but otherwise was able to ambulate with only min guard for safety  Stairs            Wheelchair Mobility    Modified Rankin (Stroke Patients Only)       Balance Overall balance assessment: Mild deficits observed, not formally tested  Pertinent Vitals/Pain Pain Assessment Pain Assessment: No/denies pain    Home Living Family/patient expects to be discharged to:: Private residence Living Arrangements: Spouse/significant other Available Help at Discharge: Family;Available 24  hours/day Type of Home: House Home Access: Stairs to enter Entrance Stairs-Rails: Left (ascending) Entrance Stairs-Number of Steps: 4-5 Alternate Level Stairs-Number of Steps:  (has stair lift) Home Layout: Laundry or work area in basement;Two level (basement is second level) Home Equipment: Grab bars - tub/shower;Toilet riser      Prior Function Prior Level of Function : Independent/Modified Independent             Mobility Comments: No AD.       Hand Dominance        Extremity/Trunk Assessment   Upper Extremity Assessment Upper Extremity Assessment: Overall WFL for tasks assessed    Lower Extremity Assessment Lower Extremity Assessment: Generalized weakness (noted with functional mobility)    Cervical / Trunk Assessment Cervical / Trunk Assessment: Normal  Communication   Communication: HOH  Cognition Arousal/Alertness: Awake/alert Behavior During Therapy: WFL for tasks assessed/performed Overall Cognitive Status: Within Functional Limits for tasks assessed                                          General Comments General comments (skin integrity, edema, etc.): SpO2 >/= 93% on RA; denied lightheadedness/dizziness; RR up to 30s with gait; encouraged >/= x3 walks/day while here and STS reps to improve strength and endurance    Exercises     Assessment/Plan    PT Assessment Patient needs continued PT services  PT Problem List Decreased strength;Decreased activity tolerance;Decreased balance;Decreased mobility       PT Treatment Interventions DME instruction;Gait training;Stair training;Functional mobility training;Therapeutic activities;Therapeutic exercise;Balance training;Neuromuscular re-education;Patient/family education    PT Goals (Current goals can be found in the Care Plan section)  Acute Rehab PT Goals Patient Stated Goal: to go home after surgery PT Goal Formulation: With patient/family Time For Goal Achievement:  08/16/22 Potential to Achieve Goals: Good    Frequency Min 3X/week     Co-evaluation               AM-PAC PT "6 Clicks" Mobility  Outcome Measure Help needed turning from your back to your side while in a flat bed without using bedrails?: A Little Help needed moving from lying on your back to sitting on the side of a flat bed without using bedrails?: A Little Help needed moving to and from a bed to a chair (including a wheelchair)?: A Little Help needed standing up from a chair using your arms (e.g., wheelchair or bedside chair)?: A Little Help needed to walk in hospital room?: A Little Help needed climbing 3-5 steps with a railing? : A Little 6 Click Score: 18    End of Session Equipment Utilized During Treatment: Gait belt Activity Tolerance: Patient tolerated treatment well Patient left: in chair;with call bell/phone within reach;with chair alarm set;with family/visitor present   PT Visit Diagnosis: Unsteadiness on feet (R26.81);Other abnormalities of gait and mobility (R26.89);Muscle weakness (generalized) (M62.81)    Time: 1233-1300 PT Time Calculation (min) (ACUTE ONLY): 27 min   Charges:   PT Evaluation $PT Eval Low Complexity: 1 Low PT Treatments $Therapeutic Activity: 8-22 mins        Moishe Spice, PT, DPT Acute Rehabilitation Services  Office: [phone]   Orvan Falconer 08/02/2022, 1:16 PM

## 2022-08-02 NOTE — Progress Notes (Signed)
Palliative Medicine Inpatient Follow Up Note HPI: 89 y.o. male   admitted on 07/30/2022 with severe CAD, 3 vessels, remote PCI, hypertension, probable right kidney RCC on surveillance, gallstones, A-fib on Eliquis, sleep apnea admitted with symptomatic heme positive IDA. Remote colon polyps and newly identified fungating mass. NSTEMI presentation felt secondary to demand in the setting of profound anemia. Patient and his family face treatment option decisions,  advanced directive decisions and anticipatory care needs.  Today's Discussion 08/02/2022  *Please note that this is a verbal dictation therefore any spelling or grammatical errors are due to the "[place] [facility]" system interpretation.  Chart reviewed inclusive of vital signs, progress notes, laboratory results, and diagnostic images.   I met with Jonthan this morning at bedside. He is alert and oriented to person, place, time, and situation. He shares that he is hard of hearing. He is able to share with me the events that led to hospitalization and the plan at this time. He shares that the teams are awaiting biopsy results prior to proceeding with surgery.  We talked more about surgery, Gibbs shares that the surgical service stated they would "cut size inches out and put it back together" in reference to the proposed surgical intervention.   Korban shares that he would never want a "bag" and he would rather "go on". We discussed that this is always a possibility though at this time one which is proposed to be less likely.   Created space and opportunity for patient to explore thoughts feelings and fears regarding current medical situation. He states that he does not have any significant fears and is "ready for surgery".  ______________  I met with Nithin and his wife, Lelan Pons later this afternoon. They share that Heith was able to mobilize in the halls with PT and did quite well. They are optimistic about him post surgically that perhaps he will  not need rehab. Discussed that this could possibly be the case but things could certainly go in a different direction as well. We reviewed that he may have some trials after surgery which would require possible short term SNF. I shared that in all honesty we don't know how things will go post operatively and there are many factors which can influence that course.   Johm continues to hope for the best.   Discussed prior renal cell carcinoma which has been know to patient and his spouse for "years". They have a follow up with nephrology for this.  Further reviewed patients h/o RCC, basal cell carcinoma(s), and new colon mass.   Questions and concerns addressed/Palliative Support Provided.   Objective Assessment: Vital Signs Vitals:   08/01/22 2320 08/02/22 0335  BP: (!) 113/58 (!) 117/54  Pulse: 60 66  Resp: 17 17  Temp: 98.7 F (37.1 C) 98.6 F (37 C)  SpO2: 98% 96%    Intake/Output Summary (Last 24 hours) at 08/02/2022 6734 Last data filed at 08/02/2022 0450 Gross per 24 hour  Intake 1211.23 ml  Output 950 ml  Net 261.23 ml   Last Weight  Most recent update: 08/02/2022  4:09 AM    Weight  111.1 kg (244 lb 14.9 oz)            Gen: Elderly caucasian M in NAD HEENT: moist mucous membranes CV: Regular rate and irregular rhythm  PULM: On RA, breathing is even and nonlabored ABD: soft/nontender EXT: No edema Neuro: Alert and oriented x3 - hard of hearing  SUMMARY OF RECOMMENDATIONS  DNAR/DNI  Patient hoping for pathology results to return so he may proceed with surgery  Appreciate Surgical, GI, and cardiology coordinative efforts  Open and honest conversation held regarding patients present clinical state and goals moving forward  Ongoing PMT support  Billing based on MDM: High ______________________________________________________________________________________ [place] Team Team Cell Phone: [phone] Please utilize  secure chat with additional questions, if there is no response within 30 minutes please call the above phone number  Palliative Medicine Team providers are available by phone from 7am to 7pm daily and can be reached through the team cell phone.  Should this patient require assistance outside of these hours, please call the patient's attending physician.

## 2022-08-02 NOTE — Progress Notes (Signed)
Rounding Note    Patient Name: Ryan Garcia Date of Encounter: 08/02/2022  [place] Cardiologist: Kirk Ruths, MD   Subjective   Patient denies any further chest pain since admit. No dyspnea.   Inpatient Medications    Scheduled Meds:  feeding supplement  1 Container Oral TID BM   insulin aspart  0-15 Units Subcutaneous Q4H   isosorbide mononitrate  30 mg Oral Daily   metoprolol tartrate  12.5 mg Oral BID   pantoprazole  40 mg Oral Q0600   rosuvastatin  40 mg Oral Daily   Continuous Infusions:  ferric gluconate (FERRLECIT) IVPB Stopped (08/01/22 1537)   PRN Meds: acetaminophen, melatonin, prochlorperazine   Vital Signs    Vitals:   08/01/22 2151 08/01/22 2320 08/02/22 0335 08/02/22 0721  BP:  (!) 113/58 (!) 117/54 114/75  Pulse:  60 66 65  Resp:  '17 17 18  '$ Temp: 99.6 F (37.6 C) 98.7 F (37.1 C) 98.6 F (37 C) 98.5 F (36.9 C)  TempSrc: Oral Oral Oral Oral  SpO2:  98% 96% 95%  Weight:   111.1 kg   Height:        Intake/Output Summary (Last 24 hours) at 08/02/2022 0803 Last data filed at 08/02/2022 0450 Gross per 24 hour  Intake 1211.23 ml  Output 950 ml  Net 261.23 ml       08/02/2022    3:35 AM 07/31/2022    4:18 PM 07/05/2022   10:45 AM  Last 3 Weights  Weight (lbs) 244 lb 14.9 oz 247 lb 6.4 oz 257 lb 8 oz  Weight (kg) 111.1 kg 112.22 kg 116.801 kg      Telemetry    NSR with  PACs - Personally Reviewed  ECG    07/31/22: NSR with PACs and PVCs. Nonspecific ST abnormality - Personally Reviewed  Physical Exam   GEN: No acute distress.  obese Neck: No JVD, hard of hearing Cardiac: IRRR, no murmurs, rubs, or gallops.  Respiratory: Clear to auscultation bilaterally. GI: Soft, nontender, non-distended  MS: No edema; No deformity. Neuro:  Nonfocal  Psych: Normal affect   Labs    High Sensitivity Troponin:   Recent Labs  Lab 07/30/22 1655 07/30/22 1956 07/31/22 0130 07/31/22 0447  TROPONINIHS 93* 425* 651* 609*       Chemistry Recent Labs  Lab 07/31/22 0336 08/01/22 0041 08/02/22 0039  NA 140 141 136  K 4.0 4.1 3.9  CL 106 109 107  CO2 '24 22 23  '$ GLUCOSE 274* 186* 183*  BUN '22 17 13  '$ CREATININE 1.14 1.16 1.16  CALCIUM 8.7* 9.0 8.2*  MG 2.1 1.9 1.7  PROT 5.8* 6.1*  --   ALBUMIN 3.1* 3.3*  --   AST 23 24  --   ALT 16 18  --   ALKPHOS 75 78  --   BILITOT 0.4 0.9  --   GFRNONAA >60 >60 >60  ANIONGAP '10 10 6     '$ Lipids  Recent Labs  Lab 07/31/22 0447  CHOL 86  TRIG 95  HDL 29*  LDLCALC 38  CHOLHDL 3.0     Hematology Recent Labs  Lab 07/31/22 0336 07/31/22 0447 07/31/22 1933 08/01/22 0041 08/02/22 0039  WBC 6.0  --   --  7.7 8.0  RBC 3.23*  --   --  3.79* 3.25*  HGB 8.2*   < > 9.2* 9.5* 8.1*  HCT 27.0*   < > 30.2* 31.0* 26.9*  MCV 83.6  --   --  81.8 82.8  MCH 25.4*  --   --  25.1* 24.9*  MCHC 30.4  --   --  30.6 30.1  RDW 15.8*  --   --  15.9* 15.9*  PLT 178  --   --  198 182   < > = values in this interval not displayed.    Thyroid No results for input(s): "TSH", "FREET4" in the last 168 hours.  BNP Recent Labs  Lab 07/31/22 0220  BNP 122.8*     DDimer No results for input(s): "DDIMER" in the last 168 hours.   Radiology    ECHOCARDIOGRAM COMPLETE  Result Date: 07/31/2022    ECHOCARDIOGRAM REPORT   Patient Name:   Ryan Garcia Date of Exam: 07/31/2022 Medical Rec #:  [id number]     Height:       71.0 in Accession #:    [id number]    Weight:       257.5 lb Date of Birth:  07/06/1933     BSA:          2.348 m Patient Age:    77 years      BP:           151/68 mmHg Patient Gender: M             HR:           64 bpm. Exam Location:  Inpatient Procedure: 2D Echo, Cardiac Doppler and Color Doppler                       STAT ECHO Reported to: Dr Eleonore Chiquito on 07/31/2022 8:06:00 AM. Indications:    NSTEMI  History:        Patient has prior history of Echocardiogram examinations, most                 recent 10/14/2020. Previous Myocardial Infarction,                  Arrythmias:Atrial Fibrillation; Risk Factors:Diabetes,                 Hypertension and Sleep Apnea.  Sonographer:    Clayton Lefort RDCS (AE) Referring Phys: [id number] [place]  1. Left ventricular ejection fraction, by estimation, is 60 to 65%. The left ventricle has normal function. The left ventricle has no regional wall motion abnormalities. There is mild concentric left ventricular hypertrophy. Left ventricular diastolic function could not be evaluated.  2. Right ventricular systolic function is normal. The right ventricular size is normal. There is mildly elevated pulmonary artery systolic pressure. The estimated right ventricular systolic pressure is 50.0 mmHg.  3. The mitral valve is grossly normal. No evidence of mitral valve regurgitation. No evidence of mitral stenosis.  4. The aortic valve is tricuspid. There is mild calcification of the aortic valve. There is mild thickening of the aortic valve. Aortic valve regurgitation is not visualized. Aortic valve sclerosis/calcification is present, without any evidence of aortic stenosis.  5. The inferior vena cava is normal in size with greater than 50% respiratory variability, suggesting right atrial pressure of 3 mmHg. Comparison(s): No significant change from prior study. FINDINGS  Left Ventricle: Left ventricular ejection fraction, by estimation, is 60 to 65%. The left ventricle has normal function. The left ventricle has no regional wall motion abnormalities. The left ventricular internal cavity size was normal in size. There is  mild concentric left ventricular hypertrophy. Left ventricular diastolic function could not be evaluated  due to atrial fibrillation. Left ventricular diastolic function could not be evaluated. Right Ventricle: The right ventricular size is normal. No increase in right ventricular wall thickness. Right ventricular systolic function is normal. There is mildly elevated pulmonary artery systolic pressure. The tricuspid  regurgitant velocity is 2.90  m/s, and with an assumed right atrial pressure of 3 mmHg, the estimated right ventricular systolic pressure is 27.7 mmHg. Left Atrium: Left atrial size was normal in size. Right Atrium: Right atrial size was normal in size. Pericardium: There is no evidence of pericardial effusion. Mitral Valve: The mitral valve is grossly normal. No evidence of mitral valve regurgitation. No evidence of mitral valve stenosis. Tricuspid Valve: The tricuspid valve is grossly normal. Tricuspid valve regurgitation is mild . No evidence of tricuspid stenosis. Aortic Valve: The aortic valve is tricuspid. There is mild calcification of the aortic valve. There is mild thickening of the aortic valve. Aortic valve regurgitation is not visualized. Aortic valve sclerosis/calcification is present, without any evidence of aortic stenosis. Aortic valve mean gradient measures 5.0 mmHg. Aortic valve peak gradient measures 9.7 mmHg. Aortic valve area, by VTI measures 2.75 cm. Pulmonic Valve: The pulmonic valve was grossly normal. Pulmonic valve regurgitation is not visualized. No evidence of pulmonic stenosis. Aorta: The aortic root and ascending aorta are structurally normal, with no evidence of dilitation. Venous: The inferior vena cava is normal in size with greater than 50% respiratory variability, suggesting right atrial pressure of 3 mmHg. IAS/Shunts: The atrial septum is grossly normal.  LEFT VENTRICLE PLAX 2D LVIDd:         4.80 cm LVIDs:         3.10 cm LV PW:         1.10 cm LV IVS:        1.20 cm LVOT diam:     2.10 cm LV SV:         93 LV SV Index:   40 LVOT Area:     3.46 cm  RIGHT VENTRICLE             IVC RV Basal diam:  3.20 cm     IVC diam: 1.40 cm RV S prime:     13.90 cm/s TAPSE (M-mode): 2.7 cm LEFT ATRIUM              Index        RIGHT ATRIUM           Index LA diam:        3.90 cm  1.66 cm/m   RA Area:     21.60 cm LA Vol (A2C):   104.0 ml 44.30 ml/m  RA Volume:   59.20 ml  25.22 ml/m LA Vol  (A4C):   70.2 ml  29.90 ml/m LA Biplane Vol: 88.7 ml  37.78 ml/m  AORTIC VALVE AV Area (Vmax):    2.86 cm AV Area (Vmean):   2.80 cm AV Area (VTI):     2.75 cm AV Vmax:           156.00 cm/s AV Vmean:          105.000 cm/s AV VTI:            0.338 m AV Peak Grad:      9.7 mmHg AV Mean Grad:      5.0 mmHg LVOT Vmax:         129.00 cm/s LVOT Vmean:        84.900 cm/s LVOT VTI:  0.268 m LVOT/AV VTI ratio: 0.79  AORTA Ao Root diam: 3.40 cm Ao Asc diam:  3.60 cm TRICUSPID VALVE TR Peak grad:   33.6 mmHg TR Vmax:        290.00 cm/s  SHUNTS Systemic VTI:  0.27 m Systemic Diam: 2.10 cm Eleonore Chiquito MD Electronically signed by Eleonore Chiquito MD Signature Date/Time: 07/31/2022/8:20:37 AM    Final     Cardiac Studies    Cardiac cath 04/12/20:  LEFT HEART CATH AND CORONARY ANGIOGRAPHY   Conclusion    Prox LAD lesion is 85% stenosed. Ost 1st Diag to 1st Diag lesion is 85% stenosed. Dist LAD lesion is 65% stenosed. Prox LAD to Mid LAD lesion is 75% stenosed. Ost Cx to Mid Cx lesion is 80% stenosed. Ost 1st Mrg to 1st Mrg lesion is 95% stenosed. Mid Cx to Dist Cx lesion is 65% stenosed. 2nd Mrg lesion is 75% stenosed. RPDA lesion is 60% stenosed. 1st RPL lesion is 70% stenosed. Prox RCA to Mid RCA lesion is 30% stenosed. Previously placed Dist RCA drug eluting stent is widely patent. Balloon angioplasty was performed. LV end diastolic pressure is normal.   1. Severe 2 vessel obstructive CAD 2. Patent stent in the RCA 3. Normal LVEDP   Plan: the severe disease in the left coronary distribution is unchanged from prior. No suitable targets for PCI. Recommend continued medical therapy. If refractory angina would need to consider CABG.    Coronary Diagrams  Diagnostic Dominance: Right  Intervention  Echo: 07/31/2022   IMPRESSIONS     1. Left ventricular ejection fraction, by estimation, is 60 to 65%. The  left ventricle has normal function. The left ventricle has no regional   wall motion abnormalities. There is mild concentric left ventricular  hypertrophy. Left ventricular diastolic  function could not be evaluated.   2. Right ventricular systolic function is normal. The right ventricular  size is normal. There is mildly elevated pulmonary artery systolic  pressure. The estimated right ventricular systolic pressure is 78.4 mmHg.   3. The mitral valve is grossly normal. No evidence of mitral valve  regurgitation. No evidence of mitral stenosis.   4. The aortic valve is tricuspid. There is mild calcification of the  aortic valve. There is mild thickening of the aortic valve. Aortic valve  regurgitation is not visualized. Aortic valve sclerosis/calcification is  present, without any evidence of  aortic stenosis.   5. The inferior vena cava is normal in size with greater than 50%  respiratory variability, suggesting right atrial pressure of 3 mmHg.   Comparison(s): No significant change from prior study.   FINDINGS   Left Ventricle: Left ventricular ejection fraction, by estimation, is 60  to 65%. The left ventricle has normal function. The left ventricle has no  regional wall motion abnormalities. The left ventricular internal cavity  size was normal in size. There is   mild concentric left ventricular hypertrophy. Left ventricular diastolic  function could not be evaluated due to atrial fibrillation. Left  ventricular diastolic function could not be evaluated.   Right Ventricle: The right ventricular size is normal. No increase in  right ventricular wall thickness. Right ventricular systolic function is  normal. There is mildly elevated pulmonary artery systolic pressure. The  tricuspid regurgitant velocity is 2.90   m/s, and with an assumed right atrial pressure of 3 mmHg, the estimated  right ventricular systolic pressure is 69.6 mmHg.   Left Atrium: Left atrial size was normal in size.   Right  Atrium: Right atrial size was normal in size.    Pericardium: There is no evidence of pericardial effusion.   Mitral Valve: The mitral valve is grossly normal. No evidence of mitral  valve regurgitation. No evidence of mitral valve stenosis.   Tricuspid Valve: The tricuspid valve is grossly normal. Tricuspid valve  regurgitation is mild . No evidence of tricuspid stenosis.   Aortic Valve: The aortic valve is tricuspid. There is mild calcification  of the aortic valve. There is mild thickening of the aortic valve. Aortic  valve regurgitation is not visualized. Aortic valve  sclerosis/calcification is present, without any  evidence of aortic stenosis. Aortic valve mean gradient measures 5.0 mmHg.  Aortic valve peak gradient measures 9.7 mmHg. Aortic valve area, by VTI  measures 2.75 cm.   Pulmonic Valve: The pulmonic valve was grossly normal. Pulmonic valve  regurgitation is not visualized. No evidence of pulmonic stenosis.   Aorta: The aortic root and ascending aorta are structurally normal, with  no evidence of dilitation.   Venous: The inferior vena cava is normal in size with greater than 50%  respiratory variability, suggesting right atrial pressure of 3 mmHg.   IAS/Shunts: The atrial septum is grossly normal.     Patient Profile     89 y.o. male with PMH of 3vCAD, paroxsymal Afib, HTN, HLD who presented with chest pain and found to have elevated troponin and anemia.   Assessment & Plan    NSTEMI- type 2 -- presented with chest pain, several episode yesterday. hsTn 93>>425>>651>>609 flat trend. EKG without significant ischemia. Pain free since admission. Suspect this is demand ischemia in the setting of new anemia. Known CAD on prior cath with severe diffuse LAD and LCx disease- not amenable to PCI.  Echo with LVEF of 60-65%, no rWMA noted. Given his acute anemia, would not pursue invasive work up at this time. Keep off anticoagulation -- continue metoprolol, statin, Imdur -- no recurrent chest pain since admission    Iron deficiency anemia.  -- presented with dyspnea, noted to have drop in Hgb from 13>>8.6 over the past year. Denies any over bleeding PTA. Notes progressive fatigue over the past month -- seen by GI, [facility]- endoscopy today shows a large fungating mass in the colon c/w CA. Multiple polyps. Biopsy taken. -- plan CT abd/pelvis. --surgical consult- anticipate surgey -- on protonix -- Hgb improved to 9.5 initially but has dropped back to 8.1  -- If surgery felt to be beneficial then I think patient could tolerate from a cardiac standpoint. Prior to development of anemia he was active and had no significant angina. EF is normal. CV risk for surgery is still at least moderate given age, anemia, CAD   Paroxsymal afib -- EKG read as afib, but review of telemetry shows sinus with 1st degree AVB with freq PACs/PVCs -- Eliquis currently held   HTN -- elevated in the ED -- continue metoprolol and Imdur. No room to increase BB with bradycardia   AKI -- Cr 1.49, improved to 1.16        For questions or updates, please contact [place] Please consult [email or web address] for contact info under        Signed, Nakyiah Kuck [place], MD  08/02/2022, 8:03 AM

## 2022-08-02 NOTE — Progress Notes (Signed)
    Progress Note   Assessment    89 year old male with new diagnosis ascending colon cancer resulting in IDA, severe CAD, hypertension, probable right kidney RCC on surveillance, history of atrial fibrillation previously on Xarelto, sleep apnea   Recommendations   1.  Ascending colon cancer --I shared the diagnostic news with the patient and his wife today at bedside.  This was as expected.  Fortunately by imaging the tumor appears to be early stage.  He has discussed this with surgery and elects for operative management. -- Continue clear liquids -- Expect operating room tomorrow for resection -- Will need to meet with oncology as an outpatient -- Unclear if surveillance colonoscopy in 1 year will be needed, this will be based on his overall health at that time as well as response to surgical management.  2.  IDA --secondary to colon cancer.  He has received IV iron -- IV iron treatment for IDA -- Follow blood counts and iron stores, repeat IV iron if needed and -- Ultimate therapy will be removing the colon cancer found in the ascending colon   Chief Complaint   Patient has no complaints other than being hungry Hoping for surgery soon Pathology has returned and I have shared with him that he has ascending colon cancer No pain  Vital signs in last 24 hours: Temp:  [98.2 F (36.8 C)-100.2 F (37.9 C)] 98.2 F (36.8 C) (02/01 1516) Pulse Rate:  [59-74] 63 (02/01 1516) Resp:  [13-19] 13 (02/01 1516) BP: (113-134)/(52-94) 131/55 (02/01 1516) SpO2:  [95 %-100 %] 99 % (02/01 1516) Weight:  [111.1 kg] 111.1 kg (02/01 0335) Last BM Date : 08/01/22  Gen: awake, alert, NAD HEENT: anicteric, op clear CV: RRR, no mrg Pulm: CTA b/l Abd: soft, NT/ND, +BS throughout Ext: no c/c/e Neuro: nonfocal   Intake/Output from previous day: 01/31 0701 - 02/01 0700 In: 1211.2 [P.O.:240; I.V.:700; IV Piggyback:271.2] Out: 950 [Urine:950] Intake/Output this shift: Total I/O In: 600  [P.O.:600] Out: 225 [Urine:225]  Lab Results: Recent Labs    07/31/22 0336 07/31/22 0447 07/31/22 1933 08/01/22 0041 08/02/22 0039  WBC 6.0  --   --  7.7 8.0  HGB 8.2*   < > 9.2* 9.5* 8.1*  HCT 27.0*   < > 30.2* 31.0* 26.9*  PLT 178  --   --  198 182   < > = values in this interval not displayed.   BMET Recent Labs    07/31/22 0336 08/01/22 0041 08/02/22 0039  NA 140 141 136  K 4.0 4.1 3.9  CL 106 109 107  CO2 '24 22 23  '$ GLUCOSE 274* 186* 183*  BUN '22 17 13  '$ CREATININE 1.14 1.16 1.16  CALCIUM 8.7* 9.0 8.2*   LFT Recent Labs    08/01/22 0041  PROT 6.1*  ALBUMIN 3.3*  AST 24  ALT 18  ALKPHOS 78  BILITOT 0.9   PT/INR Recent Labs    07/31/22 0130  LABPROT 15.5*  INR 1.2   Hepatitis Panel No results for input(s): "HEPBSAG", "HCVAB", "HEPAIGM", "HEPBIGM" in the last 72 hours.  Studies/Results: No results found.    LOS: 2 days   Jerene Bears, MD 08/02/2022, 3:48 PM See Shea Evans, [facility], to contact our on call provider

## 2022-08-02 NOTE — H&P (View-Only) (Signed)
[facility] Progress Note  1 Day Post-Op  Subjective: CC-  Wife at bedside. No complaints other than not liking the clear liquids and feeling hungry. Would still like to pursue surgery.  Objective: Vital signs in last 24 hours: Temp:  [98.2 F (36.8 C)-100.2 F (37.9 C)] 98.5 F (36.9 C) (02/01 0721) Pulse Rate:  [60-74] 69 (02/01 0819) Resp:  [17-18] 18 (02/01 0721) BP: (113-134)/(54-94) 113/94 (02/01 0819) SpO2:  [95 %-100 %] 95 % (02/01 0721) Weight:  [111.1 kg] 111.1 kg (02/01 0335) Last BM Date : 08/01/22  Intake/Output from previous day: 01/31 0701 - 02/01 0700 In: 1211.2 [P.O.:240; I.V.:700; IV Piggyback:271.2] Out: 950 [Urine:950] Intake/Output this shift: Total I/O In: 600 [P.O.:600] Out: 225 [Urine:225]  PE: Gen:  Alert, NAD, pleasant Card:  irregular, HR 50s Pulm:  rate and effort normal on room air Abd: protuberant but soft, NT, +BS, no masses, hernias, or organomegaly   Lab Results:  Recent Labs    08/01/22 0041 08/02/22 0039  WBC 7.7 8.0  HGB 9.5* 8.1*  HCT 31.0* 26.9*  PLT 198 182   BMET Recent Labs    08/01/22 0041 08/02/22 0039  NA 141 136  K 4.1 3.9  CL 109 107  CO2 22 23  GLUCOSE 186* 183*  BUN 17 13  CREATININE 1.16 1.16  CALCIUM 9.0 8.2*   PT/INR Recent Labs    07/31/22 0130  LABPROT 15.5*  INR 1.2   CMP     Component Value Date/Time   NA 136 08/02/2022 0039   NA 144 12/06/2021 1344   K 3.9 08/02/2022 0039   CL 107 08/02/2022 0039   CO2 23 08/02/2022 0039   GLUCOSE 183 (H) 08/02/2022 0039   BUN 13 08/02/2022 0039   BUN 22 12/06/2021 1344   CREATININE 1.16 08/02/2022 0039   CALCIUM 8.2 (L) 08/02/2022 0039   PROT 6.1 (L) 08/01/2022 0041   PROT 6.5 09/15/2019 0810   ALBUMIN 3.3 (L) 08/01/2022 0041   ALBUMIN 4.1 09/15/2019 0810   AST 24 08/01/2022 0041   ALT 18 08/01/2022 0041   ALKPHOS 78 08/01/2022 0041   BILITOT 0.9 08/01/2022 0041   BILITOT 0.3 09/15/2019 0810   GFRNONAA >60 08/02/2022 0039    GFRAA >60 07/03/2018 0304   Lipase     Component Value Date/Time   LIPASE 28 04/16/2020 0111       Studies/Results: No results found.  Anti-infectives: Anti-infectives (From admission, onward)    None        Assessment/Plan Ascending colon mass concerning for malignancy - s/p endoscopy and colonoscopy 1/31 with findings of likely malignant mass in the mid ascending colon.  He also had polyps in proximal, transverse, sigmoid colon which were biopsied.  He is not obstructed.   >> pathology pending - CEA 6.1. CT chest abdomen pelvis done without findings of metastatic disease.  - After long discussion yesterday between our team and palliative medicine patient has decided that he would like to pursue surgery and understands the risks. Pending pathology results (specifically for the polyps), may be able to proceed with surgery tomorrow. Continue Clear liquids today and will make him NPO after midnight if case we are able to do surgery. Continue holding eliquis.   FEN: CLD, Boost breeze, NPO after MN ID: none VTE: SCDs only in setting of ABLA   --Per primary-- NSTEMI - present on admission, likely secondary to demand ischemia. Cardiology following CAD s/p PCI afib on eliquis CHF HTN HLD OSA  right renal mass DNR/DNI  I reviewed Consultant cardiology notes, last 24 h vitals and pain scores, last 48 h intake and output, and last 24 h labs and trends    LOS: 2 days    Wellington Hampshire, [facility] Surgery 08/02/2022, 10:58 AM Please see Amion for pager number during day hours 7:00am-4:30pm

## 2022-08-02 NOTE — Progress Notes (Signed)
[facility] Progress Note  1 Day Post-Op  Subjective: CC-  Wife at bedside. No complaints other than not liking the clear liquids and feeling hungry. Would still like to pursue surgery.  Objective: Vital signs in last 24 hours: Temp:  [98.2 F (36.8 C)-100.2 F (37.9 C)] 98.5 F (36.9 C) (02/01 0721) Pulse Rate:  [60-74] 69 (02/01 0819) Resp:  [17-18] 18 (02/01 0721) BP: (113-134)/(54-94) 113/94 (02/01 0819) SpO2:  [95 %-100 %] 95 % (02/01 0721) Weight:  [111.1 kg] 111.1 kg (02/01 0335) Last BM Date : 08/01/22  Intake/Output from previous day: 01/31 0701 - 02/01 0700 In: 1211.2 [P.O.:240; I.V.:700; IV Piggyback:271.2] Out: 950 [Urine:950] Intake/Output this shift: Total I/O In: 600 [P.O.:600] Out: 225 [Urine:225]  PE: Gen:  Alert, NAD, pleasant Card:  irregular, HR 50s Pulm:  rate and effort normal on room air Abd: protuberant but soft, NT, +BS, no masses, hernias, or organomegaly   Lab Results:  Recent Labs    08/01/22 0041 08/02/22 0039  WBC 7.7 8.0  HGB 9.5* 8.1*  HCT 31.0* 26.9*  PLT 198 182   BMET Recent Labs    08/01/22 0041 08/02/22 0039  NA 141 136  K 4.1 3.9  CL 109 107  CO2 22 23  GLUCOSE 186* 183*  BUN 17 13  CREATININE 1.16 1.16  CALCIUM 9.0 8.2*   PT/INR Recent Labs    07/31/22 0130  LABPROT 15.5*  INR 1.2   CMP     Component Value Date/Time   NA 136 08/02/2022 0039   NA 144 12/06/2021 1344   K 3.9 08/02/2022 0039   CL 107 08/02/2022 0039   CO2 23 08/02/2022 0039   GLUCOSE 183 (H) 08/02/2022 0039   BUN 13 08/02/2022 0039   BUN 22 12/06/2021 1344   CREATININE 1.16 08/02/2022 0039   CALCIUM 8.2 (L) 08/02/2022 0039   PROT 6.1 (L) 08/01/2022 0041   PROT 6.5 09/15/2019 0810   ALBUMIN 3.3 (L) 08/01/2022 0041   ALBUMIN 4.1 09/15/2019 0810   AST 24 08/01/2022 0041   ALT 18 08/01/2022 0041   ALKPHOS 78 08/01/2022 0041   BILITOT 0.9 08/01/2022 0041   BILITOT 0.3 09/15/2019 0810   GFRNONAA >60 08/02/2022 0039    GFRAA >60 07/03/2018 0304   Lipase     Component Value Date/Time   LIPASE 28 04/16/2020 0111       Studies/Results: No results found.  Anti-infectives: Anti-infectives (From admission, onward)    None        Assessment/Plan Ascending colon mass concerning for malignancy - s/p endoscopy and colonoscopy 1/31 with findings of likely malignant mass in the mid ascending colon.  He also had polyps in proximal, transverse, sigmoid colon which were biopsied.  He is not obstructed.   >> pathology pending - CEA 6.1. CT chest abdomen pelvis done without findings of metastatic disease.  - After long discussion yesterday between our team and palliative medicine patient has decided that he would like to pursue surgery and understands the risks. Pending pathology results (specifically for the polyps), may be able to proceed with surgery tomorrow. Continue Clear liquids today and will make him NPO after midnight if case we are able to do surgery. Continue holding eliquis.   FEN: CLD, Boost breeze, NPO after MN ID: none VTE: SCDs only in setting of ABLA   --Per primary-- NSTEMI - present on admission, likely secondary to demand ischemia. Cardiology following CAD s/p PCI afib on eliquis CHF HTN HLD OSA  right renal mass DNR/DNI  I reviewed Consultant cardiology notes, last 24 h vitals and pain scores, last 48 h intake and output, and last 24 h labs and trends    LOS: 2 days    Wellington Hampshire, [facility] Surgery 08/02/2022, 10:58 AM Please see Amion for pager number during day hours 7:00am-4:30pm

## 2022-08-02 NOTE — Plan of Care (Signed)
  Problem: Coping: Goal: Ability to adjust to condition or change in health will improve Outcome: Progressing   Problem: Fluid Volume: Goal: Ability to maintain a balanced intake and output will improve Outcome: Progressing   Problem: Nutritional: Goal: Maintenance of adequate nutrition will improve Outcome: Progressing   Problem: Skin Integrity: Goal: Risk for impaired skin integrity will decrease Outcome: Progressing   Problem: Tissue Perfusion: Goal: Adequacy of tissue perfusion will improve Outcome: Progressing   Problem: Education: Goal: Knowledge of General Education information will improve Description: Including pain rating scale, medication(s)/side effects and non-pharmacologic comfort measures Outcome: Progressing   Problem: Clinical Measurements: Goal: Will remain free from infection Outcome: Progressing   Problem: Activity: Goal: Risk for activity intolerance will decrease Outcome: Progressing   Problem: Elimination: Goal: Will not experience complications related to urinary retention Outcome: Progressing   Problem: Safety: Goal: Ability to remain free from injury will improve Outcome: Progressing   Problem: Skin Integrity: Goal: Risk for impaired skin integrity will decrease Outcome: Progressing

## 2022-08-03 ENCOUNTER — Encounter (HOSPITAL_COMMUNITY): Payer: Self-pay | Admitting: Internal Medicine

## 2022-08-03 ENCOUNTER — Inpatient Hospital Stay (HOSPITAL_COMMUNITY): Payer: Medicare HMO | Admitting: Certified Registered Nurse Anesthetist

## 2022-08-03 ENCOUNTER — Encounter (HOSPITAL_COMMUNITY)
Admission: EM | Disposition: A | Discharge status: Home Health | Payer: Self-pay | Source: Home / Self Care | Attending: Internal Medicine

## 2022-08-03 ENCOUNTER — Encounter: Payer: Self-pay | Admitting: *Deleted

## 2022-08-03 ENCOUNTER — Other Ambulatory Visit: Payer: Self-pay | Admitting: General Surgery

## 2022-08-03 ENCOUNTER — Other Ambulatory Visit: Payer: Self-pay

## 2022-08-03 DIAGNOSIS — D638 Anemia in other chronic diseases classified elsewhere: Secondary | ICD-10-CM

## 2022-08-03 DIAGNOSIS — I10 Essential (primary) hypertension: Secondary | ICD-10-CM | POA: Diagnosis not present

## 2022-08-03 DIAGNOSIS — R1909 Other intra-abdominal and pelvic swelling, mass and lump: Secondary | ICD-10-CM | POA: Diagnosis not present

## 2022-08-03 DIAGNOSIS — E039 Hypothyroidism, unspecified: Secondary | ICD-10-CM

## 2022-08-03 DIAGNOSIS — I2510 Atherosclerotic heart disease of native coronary artery without angina pectoris: Secondary | ICD-10-CM

## 2022-08-03 DIAGNOSIS — I252 Old myocardial infarction: Secondary | ICD-10-CM | POA: Diagnosis not present

## 2022-08-03 DIAGNOSIS — I214 Non-ST elevation (NSTEMI) myocardial infarction: Secondary | ICD-10-CM | POA: Diagnosis not present

## 2022-08-03 DIAGNOSIS — Z515 Encounter for palliative care: Secondary | ICD-10-CM | POA: Diagnosis not present

## 2022-08-03 HISTORY — PX: LAPAROSCOPIC LYSIS OF ADHESIONS: SHX5905

## 2022-08-03 HISTORY — PX: LAPAROSCOPIC PARTIAL COLECTOMY: SHX5907

## 2022-08-03 LAB — CBC
HCT: 26.2 % — ABNORMAL LOW (ref 39.0–52.0)
Hemoglobin: 8 g/dL — ABNORMAL LOW (ref 13.0–17.0)
MCH: 24.9 pg — ABNORMAL LOW (ref 26.0–34.0)
MCHC: 30.5 g/dL (ref 30.0–36.0)
MCV: 81.6 fL (ref 80.0–100.0)
Platelets: 170 10*3/uL (ref 150–400)
RBC: 3.21 MIL/uL — ABNORMAL LOW (ref 4.22–5.81)
RDW: 16 % — ABNORMAL HIGH (ref 11.5–15.5)
WBC: 6.2 10*3/uL (ref 4.0–10.5)
nRBC: 0 % (ref 0.0–0.2)

## 2022-08-03 LAB — BASIC METABOLIC PANEL
Anion gap: 7 (ref 5–15)
BUN: 12 mg/dL (ref 8–23)
CO2: 21 mmol/L — ABNORMAL LOW (ref 22–32)
Calcium: 8.3 mg/dL — ABNORMAL LOW (ref 8.9–10.3)
Chloride: 110 mmol/L (ref 98–111)
Creatinine, Ser: 1.23 mg/dL (ref 0.61–1.24)
GFR, Estimated: 57 mL/min — ABNORMAL LOW (ref >60)
Glucose, Bld: 196 mg/dL — ABNORMAL HIGH (ref 70–99)
Potassium: 4 mmol/L (ref 3.5–5.1)
Sodium: 138 mmol/L (ref 135–145)

## 2022-08-03 LAB — GLUCOSE, CAPILLARY
Glucose-Capillary: 151 mg/dL — ABNORMAL HIGH (ref 70–99)
Glucose-Capillary: 177 mg/dL — ABNORMAL HIGH (ref 70–99)
Glucose-Capillary: 208 mg/dL — ABNORMAL HIGH (ref 70–99)
Glucose-Capillary: 210 mg/dL — ABNORMAL HIGH (ref 70–99)
Glucose-Capillary: 236 mg/dL — ABNORMAL HIGH (ref 70–99)
Glucose-Capillary: 251 mg/dL — ABNORMAL HIGH (ref 70–99)
Glucose-Capillary: 260 mg/dL — ABNORMAL HIGH (ref 70–99)

## 2022-08-03 LAB — MAGNESIUM: Magnesium: 1.9 mg/dL (ref 1.7–2.4)

## 2022-08-03 SURGERY — LAPAROSCOPIC PARTIAL COLECTOMY
Anesthesia: General | Site: Abdomen

## 2022-08-03 MED ORDER — FENTANYL CITRATE (PF) 100 MCG/2ML IJ SOLN
INTRAMUSCULAR | Status: AC
  Filled 2022-08-03: qty 2

## 2022-08-03 MED ORDER — BUPIVACAINE HCL (PF) 0.25 % IJ SOLN
INTRAMUSCULAR | Status: AC
  Filled 2022-08-03: qty 30

## 2022-08-03 MED ORDER — LACTATED RINGERS IV SOLN: INTRAVENOUS | Status: DC

## 2022-08-03 MED ORDER — LIDOCAINE 2% (20 MG/ML) 5 ML SYRINGE
INTRAMUSCULAR | Status: AC
  Filled 2022-08-03: qty 5

## 2022-08-03 MED ORDER — PROPOFOL 10 MG/ML IV BOLUS
INTRAVENOUS | Status: DC | PRN
  Administered 2022-08-03: 140 mg via INTRAVENOUS

## 2022-08-03 MED ORDER — CHLORHEXIDINE GLUCONATE CLOTH 2 % EX PADS
6.0000 | MEDICATED_PAD | Freq: Every day | CUTANEOUS | Status: DC
  Administered 2022-08-03 – 2022-08-05 (×3): 6 via TOPICAL

## 2022-08-03 MED ORDER — ROCURONIUM BROMIDE 10 MG/ML (PF) SYRINGE
PREFILLED_SYRINGE | INTRAVENOUS | Status: AC
  Filled 2022-08-03: qty 10

## 2022-08-03 MED ORDER — DEXAMETHASONE SODIUM PHOSPHATE 10 MG/ML IJ SOLN
INTRAMUSCULAR | Status: AC
  Filled 2022-08-03: qty 1

## 2022-08-03 MED ORDER — ENOXAPARIN SODIUM 40 MG/0.4ML IJ SOSY
40.0000 mg | PREFILLED_SYRINGE | INTRAMUSCULAR | Status: DC
  Administered 2022-08-04: 40 mg via SUBCUTANEOUS
  Filled 2022-08-03 (×2): qty 0.4

## 2022-08-03 MED ORDER — FENTANYL CITRATE (PF) 100 MCG/2ML IJ SOLN
25.0000 ug | INTRAMUSCULAR | Status: DC | PRN
  Administered 2022-08-03: 25 ug via INTRAVENOUS

## 2022-08-03 MED ORDER — ONDANSETRON HCL 4 MG/2ML IJ SOLN
INTRAMUSCULAR | Status: DC | PRN
  Administered 2022-08-03: 4 mg via INTRAVENOUS

## 2022-08-03 MED ORDER — LIDOCAINE 2% (20 MG/ML) 5 ML SYRINGE
INTRAMUSCULAR | Status: DC | PRN
  Administered 2022-08-03: 100 mg via INTRAVENOUS

## 2022-08-03 MED ORDER — SUGAMMADEX SODIUM 200 MG/2ML IV SOLN
INTRAVENOUS | Status: DC | PRN
  Administered 2022-08-03: 200 mg via INTRAVENOUS

## 2022-08-03 MED ORDER — CHLORHEXIDINE GLUCONATE 0.12 % MT SOLN
OROMUCOSAL | Status: AC
  Filled 2022-08-03: qty 15

## 2022-08-03 MED ORDER — PHENYLEPHRINE HCL-NACL 20-0.9 MG/250ML-% IV SOLN
INTRAVENOUS | Status: DC | PRN
  Administered 2022-08-03: 40 ug/min via INTRAVENOUS

## 2022-08-03 MED ORDER — AMISULPRIDE (ANTIEMETIC) 5 MG/2ML IV SOLN: 10.0000 mg | Freq: Once | INTRAVENOUS | Status: DC | PRN

## 2022-08-03 MED ORDER — MORPHINE SULFATE (PF) 2 MG/ML IV SOLN
1.0000 mg | INTRAVENOUS | Status: DC | PRN
  Administered 2022-08-03 (×2): 2 mg via INTRAVENOUS
  Filled 2022-08-03 (×2): qty 1

## 2022-08-03 MED ORDER — FENTANYL CITRATE (PF) 250 MCG/5ML IJ SOLN
INTRAMUSCULAR | Status: AC
  Filled 2022-08-03: qty 5

## 2022-08-03 MED ORDER — FENTANYL CITRATE (PF) 250 MCG/5ML IJ SOLN
INTRAMUSCULAR | Status: DC | PRN
  Administered 2022-08-03 (×3): 50 ug via INTRAVENOUS
  Administered 2022-08-03: 100 ug via INTRAVENOUS

## 2022-08-03 MED ORDER — SODIUM CHLORIDE 0.9 % IR SOLN
Status: DC | PRN
  Administered 2022-08-03: 2000 mL
  Administered 2022-08-03: 1000 mL

## 2022-08-03 MED ORDER — ORAL CARE MOUTH RINSE: 15.0000 mL | Freq: Once | OROMUCOSAL | Status: AC

## 2022-08-03 MED ORDER — CHLORHEXIDINE GLUCONATE 0.12 % MT SOLN
15.0000 mL | Freq: Once | OROMUCOSAL | Status: AC
  Administered 2022-08-03: 15 mL via OROMUCOSAL

## 2022-08-03 MED ORDER — ONDANSETRON HCL 4 MG/2ML IJ SOLN
INTRAMUSCULAR | Status: AC
  Filled 2022-08-03: qty 2

## 2022-08-03 MED ORDER — SODIUM CHLORIDE 0.9 % IV SOLN: INTRAVENOUS | Status: DC

## 2022-08-03 MED ORDER — DEXAMETHASONE SODIUM PHOSPHATE 10 MG/ML IJ SOLN
INTRAMUSCULAR | Status: DC | PRN
  Administered 2022-08-03: 4 mg via INTRAVENOUS

## 2022-08-03 MED ORDER — ROCURONIUM BROMIDE 10 MG/ML (PF) SYRINGE
PREFILLED_SYRINGE | INTRAVENOUS | Status: DC | PRN
  Administered 2022-08-03: 80 mg via INTRAVENOUS

## 2022-08-03 MED ORDER — BUPIVACAINE HCL 0.25 % IJ SOLN
INTRAMUSCULAR | Status: DC | PRN
  Administered 2022-08-03: 18 mL

## 2022-08-03 MED ORDER — PROMETHAZINE HCL 25 MG/ML IJ SOLN: 6.2500 mg | INTRAMUSCULAR | Status: DC | PRN

## 2022-08-03 SURGICAL SUPPLY — 61 items
APL PRP STRL LF DISP 70% ISPRP (MISCELLANEOUS) ×1
BAG COUNTER SPONGE SURGICOUNT (BAG) ×1 IMPLANT
BAG SPNG CNTER NS LX DISP (BAG) ×1
CANISTER SUCT 3000ML PPV (MISCELLANEOUS) ×1 IMPLANT
CHLORAPREP W/TINT 26 (MISCELLANEOUS) ×1 IMPLANT
COVER MAYO STAND STRL (DRAPES) ×1 IMPLANT
COVER SURGICAL LIGHT HANDLE (MISCELLANEOUS) ×2 IMPLANT
DRAPE UTILITY XL STRL (DRAPES) ×1 IMPLANT
DRAPE WARM FLUID 44X44 (DRAPES) ×1 IMPLANT
DRSG OPSITE POSTOP 4X8 (GAUZE/BANDAGES/DRESSINGS) IMPLANT
DRSG TEGADERM 2-3/8X2-3/4 SM (GAUZE/BANDAGES/DRESSINGS) IMPLANT
ELECT BLADE 4.0 EZ CLEAN MEGAD (MISCELLANEOUS) ×1
ELECT BLADE 6.5 EXT (BLADE) ×1 IMPLANT
ELECT CAUTERY BLADE 6.4 (BLADE) ×1 IMPLANT
ELECT REM PT RETURN 9FT ADLT (ELECTROSURGICAL) ×1
ELECTRODE BLDE 4.0 EZ CLN MEGD (MISCELLANEOUS) IMPLANT
ELECTRODE REM PT RTRN 9FT ADLT (ELECTROSURGICAL) ×1 IMPLANT
GAUZE SPONGE 2X2 8PLY STRL LF (GAUZE/BANDAGES/DRESSINGS) IMPLANT
GLOVE BIO SURGEON STRL SZ7.5 (GLOVE) ×2 IMPLANT
GOWN STRL REUS W/ TWL LRG LVL3 (GOWN DISPOSABLE) ×8 IMPLANT
GOWN STRL REUS W/TWL LRG LVL3 (GOWN DISPOSABLE) ×4
KIT BASIN OR (CUSTOM PROCEDURE TRAY) ×1 IMPLANT
KIT TURNOVER KIT B (KITS) ×1 IMPLANT
LIGASURE IMPACT 36 18CM CVD LR (INSTRUMENTS) IMPLANT
NS IRRIG 1000ML POUR BTL (IV SOLUTION) ×2 IMPLANT
PACK COLON (CUSTOM PROCEDURE TRAY) IMPLANT
PAD ARMBOARD 7.5X6 YLW CONV (MISCELLANEOUS) ×2 IMPLANT
PENCIL SMOKE EVACUATOR (MISCELLANEOUS) ×2 IMPLANT
RELOAD PROXIMATE 75MM BLUE (ENDOMECHANICALS) ×2 IMPLANT
RELOAD PROXIMATE TA60MM GREEN (ENDOMECHANICALS) ×1 IMPLANT
RELOAD STAPLE 60 4.7 GRN THCK (ENDOMECHANICALS) IMPLANT
RELOAD STAPLE 75 3.8 BLU REG (ENDOMECHANICALS) IMPLANT
RELOAD STAPLER LINE PROX 60 GR (STAPLE) ×1 IMPLANT
RETRACTOR WND ALEXIS 25 LRG (MISCELLANEOUS) IMPLANT
RTRCTR WOUND ALEXIS 25CM LRG (MISCELLANEOUS) ×1
SCISSORS LAP 5X35 DISP (ENDOMECHANICALS) ×1 IMPLANT
SET IRRIG TUBING LAPAROSCOPIC (IRRIGATION / IRRIGATOR) IMPLANT
SET TUBE SMOKE EVAC HIGH FLOW (TUBING) ×1 IMPLANT
SHEARS HARMONIC ACE PLUS 36CM (ENDOMECHANICALS) ×1 IMPLANT
SLEEVE Z-THREAD 5X100MM (TROCAR) ×1 IMPLANT
SPECIMEN JAR LARGE (MISCELLANEOUS) ×1 IMPLANT
STAPLER PROXIMATE 75MM BLUE (STAPLE) IMPLANT
STAPLER RELOAD LINE PROX 60 GR (STAPLE) ×1
STAPLER RELOADABLE 60 GRN THCK (STAPLE) IMPLANT
STAPLER VISISTAT (STAPLE) IMPLANT
STAPLER VISISTAT 35W (STAPLE) ×1 IMPLANT
SUT PDS AB 1 TP1 96 (SUTURE) IMPLANT
SUT SILK 2 0 SH CR/8 (SUTURE) IMPLANT
SUT SILK 3 0 SH CR/8 (SUTURE) ×1 IMPLANT
SUT VICRYL 0 UR6 27IN ABS (SUTURE) IMPLANT
SYR BULB IRRIG 60ML STRL (SYRINGE) ×1 IMPLANT
SYS LAPSCP GELPORT 120MM (MISCELLANEOUS) ×1
SYSTEM LAPSCP GELPORT 120MM (MISCELLANEOUS) IMPLANT
TOWEL GREEN STERILE (TOWEL DISPOSABLE) ×2 IMPLANT
TRAY FOLEY MTR SLVR 16FR STAT (SET/KITS/TRAYS/PACK) ×1 IMPLANT
TRAY LAPAROSCOPIC MC (CUSTOM PROCEDURE TRAY) ×1 IMPLANT
TROCAR BALLN 12MMX100 BLUNT (TROCAR) IMPLANT
TROCAR Z-THREAD OPTICAL 5X100M (TROCAR) ×1 IMPLANT
TUBE CONNECTING 12X1/4 (SUCTIONS) ×2 IMPLANT
WATER STERILE IRR 1000ML POUR (IV SOLUTION) ×1 IMPLANT
YANKAUER SUCT BULB TIP NO VENT (SUCTIONS) ×2 IMPLANT

## 2022-08-03 NOTE — Anesthesia Preprocedure Evaluation (Addendum)
Anesthesia Evaluation  Patient identified by MRN, date of birth, ID band Patient awake    Reviewed: Allergy & Precautions, NPO status , Patient's Chart, lab work & pertinent test results  History of Anesthesia Complications Negative for: history of anesthetic complications  Airway Mallampati: III  TM Distance: >3 FB Neck ROM: Full    Dental  (+) Teeth Intact   Pulmonary sleep apnea and Continuous Positive Airway Pressure Ventilation    Pulmonary exam normal        Cardiovascular hypertension, + CAD and + Past MI  Normal cardiovascular exam+ dysrhythmias Atrial Fibrillation   07/31/2022  1. Left ventricular ejection fraction, by estimation, is 60 to 65%. The  left ventricle has normal function. The left ventricle has no regional  wall motion abnormalities. There is mild concentric left ventricular  hypertrophy. Left ventricular diastolic  function could not be evaluated.   2. Right ventricular systolic function is normal. The right ventricular  size is normal. There is mildly elevated pulmonary artery systolic  pressure. The estimated right ventricular systolic pressure is 32.1 mmHg.   3. The mitral valve is grossly normal. No evidence of mitral valve  regurgitation. No evidence of mitral stenosis.   4. The aortic valve is tricuspid. There is mild calcification of the  aortic valve. There is mild thickening of the aortic valve. Aortic valve  regurgitation is not visualized. Aortic valve sclerosis/calcification is  present, without any evidence of  aortic stenosis.   5. The inferior vena cava is normal in size with greater than 50%  respiratory variability, suggesting right atrial pressure of 3 mmHg.     Neuro/Psych negative neurological ROS     GI/Hepatic Neg liver ROS,,,  Endo/Other  diabetesHypothyroidism    Renal/GU negative Renal ROS     Musculoskeletal  (+) Arthritis ,    Abdominal   Peds  Hematology  (+)  Blood dyscrasia, anemia   Anesthesia Other Findings All: loratidine, simvastatin  Reproductive/Obstetrics                             Anesthesia Physical Anesthesia Plan  ASA: 3  Anesthesia Plan: General   Post-op Pain Management: Toradol IV (intra-op)* and Tylenol PO (pre-op)*   Induction: Intravenous  PONV Risk Score and Plan: 4 or greater and Ondansetron, Dexamethasone, Midazolam and Scopolamine patch - Pre-op  Airway Management Planned:   Additional Equipment: None  Intra-op Plan:   Post-operative Plan: Extubation in OR  Informed Consent: I have reviewed the patients History and Physical, chart, labs and discussed the procedure including the risks, benefits and alternatives for the proposed anesthesia with the patient or authorized representative who has indicated his/her understanding and acceptance.     Dental advisory given  Plan Discussed with: Anesthesiologist and CRNA  Anesthesia Plan Comments:         Anesthesia Quick Evaluation

## 2022-08-03 NOTE — Anesthesia Postprocedure Evaluation (Signed)
Anesthesia Post Note  Patient: Ryan Garcia  Procedure(s) Performed: OPEN PARTIAL COLECTOMY (Abdomen) LAPAROSCOPIC LYSIS OF ADHESIONS (Abdomen)     Patient location during evaluation: PACU Anesthesia Type: General Level of consciousness: sedated Pain management: pain level controlled Vital Signs Assessment: post-procedure vital signs reviewed and stable Respiratory status: spontaneous breathing and respiratory function stable Cardiovascular status: stable Postop Assessment: no apparent nausea or vomiting Anesthetic complications: no   No notable events documented.  Last Vitals:  Vitals:   08/03/22 1245 08/03/22 1300  BP: (!) 141/57 (!) 147/56  Pulse: (!) 53 (!) 55  Resp: (!) 21 17  Temp:  37.1 C  SpO2: 95% 94%    Last Pain:  Vitals:   08/03/22 1300  TempSrc:   PainSc: Asleep                 Cataleya Cristina DANIEL

## 2022-08-03 NOTE — Progress Notes (Signed)
PT Cancellation Note  Patient Details Name: Ryan Garcia MRN: [id number] DOB: August 27, 1933   Cancelled Treatment:    Reason Eval/Treat Not Completed: Fatigue/lethargy limiting ability to participate. Pt lethargic after surgery. PT will attempt to follow up as time allows.   Zenaida Niece 08/03/2022, 3:36 PM

## 2022-08-03 NOTE — Progress Notes (Signed)
Oncology Discharge Planning Note  [facility] Address: [address] [place], [place], [place] [address] Hours of Operation:  Nena Polio, Monday - Friday  Clinic Contact Information:  [phone]) [phone]  Oncology Care Team: Medical Oncologist:  Benay Spice  Patient Details: Name:  Ryan Garcia, Ryan Garcia MRN:   [id number] DOB:   06-02-1934 Reason for Current Admission: '@PPROB'$ @  Discharge Planning Narrative: Notification of admission received by Inpatient team for Angela Nevin.  Discharge follow-up appointments for oncology are current and available on the AVS and MyChart.   Upon discharge from the hospital, hematology/oncology's post discharge plan of care for the outpatient setting is: August 24, 2022 at 1:40 pm (check in 1:30) [facility] at [place], [place] [address]  [facility] will be called within two business days after discharge to review hematology/oncology's plan of care for full understanding.    Outpatient Oncology Specific Care Only: Oncology appointment transportation needs addressed?:  no Oncology medication management for symptom management addressed?:  no Chemo Alert Card reviewed?:  not applicable Immunotherapy Alert Card reviewed?:  not applicable

## 2022-08-03 NOTE — Progress Notes (Signed)
Pt placed self on cpap.  

## 2022-08-03 NOTE — Progress Notes (Signed)
PROGRESS NOTE Ryan Garcia  VFI:[id number] DOB: 1934-05-15 DOA: 07/30/2022 PCP: Abner Greenspan, MD   Brief Narrative/Hospital Course: 89 y.o. male with medical history significant for triple-vessel coronary artery disease status post PCI with stent placement, paroxysmal A-fib on Eliquis, chronic diastolic CHF, hypertension, hyperlipidemia, OSA on CPAP, right renal mass diagnosed 2 years ago, followed by urology presented with exertional chest pain.  On presentation, EKG did not show any evidence of acute ischemia but troponins were 93 and subsequently 425.  Hemoglobin was 8.7 (from baseline of 13.1).  Eliquis was held.  He was started on IV Protonix. CT chest abdomen and pelvis with contrast showed no evidence of active bleeding however showed 2.5 x 2.6 cm enhancing lesion in the anterior right upper kidney grossly unchanged from prior MRI compatible with solid renal neoplasm such as renal cell carcinoma.  Cardiology and GI were consulted.  Palliative care was also consulted for goals of care discussion.  1/31: Underwent EGD that was normal biopsy obtained,Colonoscopy done: Malignant tumor in the mid descending colon biopsied, had polyps in transverse colon and sigmoid colon,CEA  and surgery consult obtained.  Patient had CT chest abdomen pelvis showing renal lesion but without suspicion for metastatic disease.  Palliative care consulted> patient interested to pursue surgical option.  Surgery plans to await pathology results and consider surgery, also feels with recent NSTEMI high risk for cardiac complication and also bleeding risk from tumor    Subjective: Seen in PACU S/p OR lap assisted rt colectomy [place]. No complaints   Assessment and Plan: Principal Problem:   NSTEMI (non-ST elevated myocardial infarction) ([place]) Active Problems:   Ascending colon malignant neoplasm ([place])   Benign neoplasm of transverse colon   Benign neoplasm of sigmoid colon   Benign neoplasm of rectum   Duodenitis    Large right colon mass concerning for malignancy:S/p egs/ colo 1/31:EGD  normal ,Colonoscopy: Malignant tumor in the mid descending colon biopsied, had polyps in transverse colon and sigmoid colon,.CT chest abdomen pelvis showing renal lesion but without suspicion for metastatic disease.  CEA 6.1. S/p OR lap assisted rt colectomy 08/03/22. Cont diet, pain control per CCS.   NSTEMI type II/demand ischemia: Cardiology on board troponin 93 peaked to 651> 609 flat trend EKG without significant ischemia, chest pain on admission.  Per cardiology likely demand ischemia in the setting of anemia with known CAD on  prior cath severe diffuse LAD and LCx disease.  Echo with EF 60 to 65%, no RWMA, due to anemia not pursuing invasive workup,.  Continue on metoprolol statin and Imdur.  Iron deficiency anemia: Likely due to #1.  Status post IV iron monitor hemoglobin transfuse for less than 7 g> check CBC in AM. Recent Labs  Lab 07/31/22 0447 07/31/22 1933 08/01/22 0041 08/02/22 0039 08/03/22 0044  HGB 8.6* 9.2* 9.5* 8.1* 8.0*  HCT 28.4* 30.2* 31.0* 26.9* 26.2*     Chronic diastolic CZY:SAYTKZSWFUX monitor intake output Daily weight continue Imdur and metoprolol.wt stable/improving as below [employer]   08/02/22 0335 08/03/22 0334 08/03/22 0611  Weight: 111.1 kg 111.1 kg 110.5 kg    Acute kidney injury likely from dehydration baseline 1.1> improved as below. Recent Labs  Lab 07/30/22 1655 07/31/22 0336 08/01/22 0041 08/02/22 0039 08/03/22 0044  NA 139 140 141 136 138  K 4.5 4.0 4.1 3.9 4.0  CL 107 106 109 107 110  CO2 '23 24 22 23 '$ 21*  GLUCOSE 321* 274* 186* 183* 196*  BUN '23 22 17 '$ 13  12  CREATININE 1.49* 1.14 1.16 1.16 1.23  CALCIUM 8.4* 8.7* 9.0 8.2* 8.3*  MG  --  2.1 1.9 1.7 1.9  PHOS  --  3.2  --   --   --     Paroxysmal atrial fibrillation: Eliquis on hold> follow-up with surgery to resume postop hopefully soon if hemoglobin is stable Hypertension: BP stable, continue Imdur and  metoprolol. HLD continue statin Right renal mass concerning for renal cell carcinoma follow-up with urology as outpatient OSA continue CPAP nightly Type 2 diabetes mellitus: A1c 7.8 blood sugar stable on SSI Recent Labs  Lab 07/31/22 0447 07/31/22 0808 08/02/22 1932 08/02/22 2323 08/03/22 0319 08/03/22 0751 08/03/22 1204  GLUCAP  --    < > 264* 197* 151* 177* 210*  HGBA1C 7.8*  --   --   --   --   --   --    < > = values in this interval not displayed.     Class I obesity advised weight loss Physical deconditioning continue PT OT Goals of care palliative care following currently DNR  DVT prophylaxis: SCD's Start: 08/02/22 1528scd, Eliquis on hold Code Status:   Code Status: DNR Family Communication: plan of care discussed with patient. Patient status is: Inpatient because of colon cancer Level of care: Progressive   Dispo: The patient is from: [place] w/ wife            Anticipated disposition: TBD >2-3 DAYS Objective: Vitals last 24 hrs: Vitals:   08/03/22 1215 08/03/22 1230 08/03/22 1245 08/03/22 1300  BP: (!) 128/56 (!) 138/53 (!) 141/57 (!) 147/56  Pulse: (!) 52 (!) 50 (!) 53 (!) 55  Resp: 19 15 (!) 21 17  Temp:    98.7 F (37.1 C)  TempSrc:      SpO2: 95% 95% 95% 94%  Weight:      Height:       Weight change: 0 kg  Physical Examination: General exam: sleepy, weak,older appearing HEENT:Oral mucosa moist, Ear/Nose WNL grossly, dentition normal. Respiratory system: bilaterally clear diminished BS, no use of accessory muscle Cardiovascular system: S1 & S2 +, regular rate. Gastrointestinal system: Abdomen soft, surgical site w/ honeycomb dressing/staples in tact. Nervous System:Alert, awake, moving extremities and grossly nonfocal Extremities: LE ankle edema neg, lower extremities warm Skin: No rashes,no icterus. MSK: Normal muscle bulk,tone, power   Medications reviewed:  Scheduled Meds:  chlorhexidine       [MAR Hold] feeding supplement  1 Container Oral  TID BM   fentaNYL       [MAR Hold] insulin aspart  0-15 Units Subcutaneous Q4H   [MAR Hold] isosorbide mononitrate  30 mg Oral Daily   [MAR Hold] metoprolol tartrate  12.5 mg Oral BID   [MAR Hold] pantoprazole  40 mg Oral Q0600   [MAR Hold] rosuvastatin  40 mg Oral Daily   Continuous Infusions:  [MAR Hold] ferric gluconate (FERRLECIT) IVPB 250 mg (08/02/22 1136)   lactated ringers      Diet Order             Diet NPO time specified  Diet effective midnight                  Intake/Output Summary (Last 24 hours) at 08/03/2022 1312 Last data filed at 08/03/2022 1253 Gross per 24 hour  Intake 1340 ml  Output 2145 ml  Net -805 ml    Net IO Since Admission: -1,403.77 mL [08/03/22 1312]  Wt Readings from Last 3 Encounters:  08/03/22 110.5 kg  07/05/22 116.8 kg  07/03/22 114.8 kg     Unresulted Labs (From admission, onward)     Start     Ordered   07/31/22 0000  Type and screen  R       Comments: [place]    07/31/22 0134   Signed and Held  CBC  (enoxaparin (LOVENOX)  CrCl >/= 30 mL/min  )  Once,   R       Comments: Baseline for enoxaparin therapy IF NOT ALREADY DRAWN. Notify MD if PLT < 100 K.   Question:  Specimen collection method  Answer:  Lab=Lab collect   Signed and Held   Signed and Held  Creatinine, serum  (enoxaparin (LOVENOX)  CrCl >/= 30 mL/min  )  Once,   R       Comments: Baseline for enoxaparin therapy IF NOT ALREADY DRAWN.   Question:  Specimen collection method  Answer:  Lab=Lab collect   Signed and Held   Signed and Held  Creatinine, serum  (enoxaparin (LOVENOX)  CrCl >/= 30 mL/min  )  Weekly,   R     Comments: while on enoxaparin therapy.   Question:  Specimen collection method  Answer:  Lab=Lab collect   Signed and Held   Signed and Held  CBC  Tomorrow morning,   R       Question:  Specimen collection method  Answer:  Lab=Lab collect   Signed and Held   Signed and Held  Basic metabolic panel  Tomorrow morning,   R        Question:  Specimen collection method  Answer:  Lab=Lab collect   Signed and Held           Data Reviewed: I have personally reviewed following labs and imaging studies CBC: Recent Labs  Lab 07/30/22 1655 07/31/22 0336 07/31/22 0447 07/31/22 1933 08/01/22 0041 08/02/22 0039 08/03/22 0044  WBC 6.8 6.0  --   --  7.7 8.0 6.2  NEUTROABS 5.2  --   --   --   --  6.1  --   HGB 8.7* 8.2* 8.6* 9.2* 9.5* 8.1* 8.0*  HCT 29.6* 27.0* 28.4* 30.2* 31.0* 26.9* 26.2*  MCV 84.6 83.6  --   --  81.8 82.8 81.6  PLT 205 178  --   --  198 182 517    Basic Metabolic Panel: Recent Labs  Lab 07/30/22 1655 07/31/22 0336 08/01/22 0041 08/02/22 0039 08/03/22 0044  NA 139 140 141 136 138  K 4.5 4.0 4.1 3.9 4.0  CL 107 106 109 107 110  CO2 '23 24 22 23 '$ 21*  GLUCOSE 321* 274* 186* 183* 196*  BUN '23 22 17 13 12  '$ CREATININE 1.49* 1.14 1.16 1.16 1.23  CALCIUM 8.4* 8.7* 9.0 8.2* 8.3*  MG  --  2.1 1.9 1.7 1.9  PHOS  --  3.2  --   --   --     GFR: Estimated Creatinine Clearance: 54.5 mL/min (by C-G formula based on SCr of 1.23 mg/dL). Liver Function Tests: Recent Labs  Lab 07/31/22 0336 08/01/22 0041  AST 23 24  ALT 16 18  ALKPHOS 75 78  BILITOT 0.4 0.9  PROT 5.8* 6.1*  ALBUMIN 3.1* 3.3*    No results for input(s): "LIPASE", "AMYLASE" in the last 168 hours. No results for input(s): "AMMONIA" in the last 168 hours. Coagulation Profile: Recent Labs  Lab 07/31/22 0130  INR 1.2   CBG: Recent  Labs  Lab 08/02/22 1932 08/02/22 2323 08/03/22 0319 08/03/22 0751 08/03/22 1204  GLUCAP 264* 197* 151* 177* 210*     Recent Results (from the past 240 hour(s))  MRSA Next Gen by PCR, Nasal     Status: None   Collection Time: 07/31/22  4:30 PM   Specimen: Nasal Mucosa; Nasal Swab  Result Value Ref Range Status   MRSA by PCR Next Gen NOT DETECTED NOT DETECTED Final    Comment: (NOTE) The GeneXpert MRSA Assay (FDA approved for NASAL specimens only), is one component of a  comprehensive MRSA colonization surveillance program. It is not intended to diagnose MRSA infection nor to guide or monitor treatment for MRSA infections. Test performance is not FDA approved in patients less than 56 years old. Performed at [facility], [place] [address], [place], [place] [address]     Antimicrobials: Anti-infectives (From admission, onward)    Start     Dose/Rate Route Frequency Ordered Stop   08/03/22 0600  cefoTEtan (CEFOTAN) 2 g in sodium chloride 0.9 % 100 mL IVPB        2 g 200 mL/hr over 30 Minutes Intravenous On call to O.R. 08/02/22 1527 08/03/22 1030      Culture/Microbiology    Component Value Date/Time   SDES URINE, RANDOM 04/14/2020 2022   SPECREQUEST NONE 04/14/2020 2022   CULT (A) 04/14/2020 2022    <10,000 COLONIES/mL INSIGNIFICANT GROWTH Performed at [facility] [address] [place], [place] [address]    REPTSTATUS 04/16/2020 FINAL 04/14/2020 2022  Radiology Studies: No results found.   LOS: 3 days   Antonieta Pert, MD [facility]  08/03/2022, 1:12 PM

## 2022-08-03 NOTE — Progress Notes (Signed)
Patient placed himself on home CPAP for the night.  

## 2022-08-03 NOTE — Interval H&P Note (Signed)
History and Physical Interval Note:  08/03/2022 9:31 AM  Ryan Garcia  has presented today for surgery, with the diagnosis of Ascending colon mass.  The various methods of treatment have been discussed with the patient and family. After consideration of risks, benefits and other options for treatment, the patient has consented to  Procedure(s): LAPAROSCOPIC ASSISTED PARTIAL COLECTOMY (N/A) as a surgical intervention.  The patient's history has been reviewed, patient examined, no change in status, stable for surgery.  I have reviewed the patient's chart and labs.  Questions were answered to the patient's satisfaction.     Autumn Messing III

## 2022-08-03 NOTE — Transfer of Care (Signed)
Immediate Anesthesia Transfer of Care Note  Patient: Ryan Garcia  Procedure(s) Performed: OPEN PARTIAL COLECTOMY (Abdomen) LAPAROSCOPIC LYSIS OF ADHESIONS (Abdomen)  Patient Location: PACU  Anesthesia Type:General  Level of Consciousness: awake, alert , and oriented  Airway & Oxygen Therapy: Patient Spontanous Breathing and Patient connected to face mask oxygen  Post-op Assessment: Report given to RN and Post -op Vital signs reviewed and stable  Post vital signs: Reviewed and stable  Last Vitals:  Vitals Value Taken Time  BP 137/53 08/03/22 1200  Temp    Pulse 65 08/03/22 1200  Resp 11 08/03/22 1200  SpO2 96 % 08/03/22 1200  Vitals shown include unvalidated device data.  Last Pain:  Vitals:   08/03/22 0838  TempSrc: Oral  PainSc: 0-No pain         Complications: No notable events documented.

## 2022-08-03 NOTE — Anesthesia Procedure Notes (Signed)
Procedure Name: Intubation Date/Time: 08/03/2022 9:55 AM  Performed by: Genelle Bal, CRNAPre-anesthesia Checklist: Patient identified, Emergency Drugs available, Suction available and Patient being monitored Patient Re-evaluated:Patient Re-evaluated prior to induction Oxygen Delivery Method: Circle system utilized Preoxygenation: Pre-oxygenation with 100% oxygen Induction Type: IV induction Ventilation: Mask ventilation without difficulty and Oral airway inserted - appropriate to patient size Laryngoscope Size: Sabra Heck and 2 Grade View: Grade II Tube type: Oral Tube size: 7.5 mm Number of attempts: 1 Airway Equipment and Method: Stylet and Oral airway Placement Confirmation: ETT inserted through vocal cords under direct vision, positive ETCO2 and breath sounds checked- equal and bilateral Secured at: 22 cm Tube secured with: Tape Dental Injury: Teeth and Oropharynx as per pre-operative assessment

## 2022-08-03 NOTE — Progress Notes (Signed)
Rounding Note    Patient Name: Ryan Garcia Date of Encounter: 08/03/2022  [facility] Cardiologist: Kirk Ruths, MD   Subjective   Patient seen post op. Denies any chest pain or dyspnea. Some abdominal discomfort.  Inpatient Medications    Scheduled Meds:  chlorhexidine       Chlorhexidine Gluconate Cloth  6 each Topical Q0600   [START ON 08/04/2022] enoxaparin (LOVENOX) injection  40 mg Subcutaneous Q24H   fentaNYL       insulin aspart  0-15 Units Subcutaneous Q4H   isosorbide mononitrate  30 mg Oral Daily   metoprolol tartrate  12.5 mg Oral BID   pantoprazole  40 mg Oral Q0600   rosuvastatin  40 mg Oral Daily   Continuous Infusions:  sodium chloride 75 mL/hr at 08/03/22 1532   ferric gluconate (FERRLECIT) IVPB 250 mg (08/02/22 1136)   PRN Meds: acetaminophen, chlorhexidine, fentaNYL, melatonin, morphine injection, prochlorperazine   Vital Signs    Vitals:   08/03/22 1245 08/03/22 1300 08/03/22 1328 08/03/22 1612  BP: (!) 141/57 (!) 147/56 (!) 154/62 (!) 168/77  Pulse: (!) 53 (!) 55 60 67  Resp: (!) '21 17 20 18  '$ Temp:  98.7 F (37.1 C) 98.1 F (36.7 C) 98.4 F (36.9 C)  TempSrc:   Oral Oral  SpO2: 95% 94% 97% 97%  Weight:      Height:        Intake/Output Summary (Last 24 hours) at 08/03/2022 1615 Last data filed at 08/03/2022 1613 Gross per 24 hour  Intake 1340 ml  Output 2345 ml  Net -1005 ml       08/03/2022    6:11 AM 08/03/2022    3:34 AM 08/02/2022    3:35 AM  Last 3 Weights  Weight (lbs) 243 lb 9.7 oz 244 lb 14.9 oz 244 lb 14.9 oz  Weight (kg) 110.5 kg 111.1 kg 111.1 kg      Telemetry    NSR with  PACs - Personally Reviewed  ECG    07/31/22: NSR with PACs and PVCs. Nonspecific ST abnormality - Personally Reviewed  Physical Exam   GEN: No acute distress.  obese Neck: No JVD, hard of hearing Cardiac: IRRR, no murmurs, rubs, or gallops.  Respiratory: Clear to auscultation bilaterally. GI: Soft, nontender, non-distended  MS:  No edema; No deformity. Neuro:  Nonfocal  Psych: Normal affect   Labs    High Sensitivity Troponin:   Recent Labs  Lab 07/30/22 1655 07/30/22 1956 07/31/22 0130 07/31/22 0447  TROPONINIHS 93* 425* 651* 609*      Chemistry Recent Labs  Lab 07/31/22 0336 08/01/22 0041 08/02/22 0039 08/03/22 0044  NA 140 141 136 138  K 4.0 4.1 3.9 4.0  CL 106 109 107 110  CO2 '24 22 23 '$ 21*  GLUCOSE 274* 186* 183* 196*  BUN '22 17 13 12  '$ CREATININE 1.14 1.16 1.16 1.23  CALCIUM 8.7* 9.0 8.2* 8.3*  MG 2.1 1.9 1.7 1.9  PROT 5.8* 6.1*  --   --   ALBUMIN 3.1* 3.3*  --   --   AST 23 24  --   --   ALT 16 18  --   --   ALKPHOS 75 78  --   --   BILITOT 0.4 0.9  --   --   GFRNONAA >60 >60 >60 57*  ANIONGAP '10 10 6 7     '$ Lipids  Recent Labs  Lab 07/31/22 0447  CHOL 86  TRIG 95  HDL 29*  LDLCALC 38  CHOLHDL 3.0     Hematology Recent Labs  Lab 08/01/22 0041 08/02/22 0039 08/03/22 0044  WBC 7.7 8.0 6.2  RBC 3.79* 3.25* 3.21*  HGB 9.5* 8.1* 8.0*  HCT 31.0* 26.9* 26.2*  MCV 81.8 82.8 81.6  MCH 25.1* 24.9* 24.9*  MCHC 30.6 30.1 30.5  RDW 15.9* 15.9* 16.0*  PLT 198 182 170    Thyroid No results for input(s): "TSH", "FREET4" in the last 168 hours.  BNP Recent Labs  Lab 07/31/22 0220  BNP 122.8*     DDimer No results for input(s): "DDIMER" in the last 168 hours.   Radiology    No results found.  Cardiac Studies    Cardiac cath 04/12/20:  LEFT HEART CATH AND CORONARY ANGIOGRAPHY   Conclusion    Prox LAD lesion is 85% stenosed. Ost 1st Diag to 1st Diag lesion is 85% stenosed. Dist LAD lesion is 65% stenosed. Prox LAD to Mid LAD lesion is 75% stenosed. Ost Cx to Mid Cx lesion is 80% stenosed. Ost 1st Mrg to 1st Mrg lesion is 95% stenosed. Mid Cx to Dist Cx lesion is 65% stenosed. 2nd Mrg lesion is 75% stenosed. RPDA lesion is 60% stenosed. 1st RPL lesion is 70% stenosed. Prox RCA to Mid RCA lesion is 30% stenosed. Previously placed Dist RCA drug eluting stent  is widely patent. Balloon angioplasty was performed. LV end diastolic pressure is normal.   1. Severe 2 vessel obstructive CAD 2. Patent stent in the RCA 3. Normal LVEDP   Plan: the severe disease in the left coronary distribution is unchanged from prior. No suitable targets for PCI. Recommend continued medical therapy. If refractory angina would need to consider CABG.    Coronary Diagrams  Diagnostic Dominance: Right  Intervention  Echo: 07/31/2022   IMPRESSIONS     1. Left ventricular ejection fraction, by estimation, is 60 to 65%. The  left ventricle has normal function. The left ventricle has no regional  wall motion abnormalities. There is mild concentric left ventricular  hypertrophy. Left ventricular diastolic  function could not be evaluated.   2. Right ventricular systolic function is normal. The right ventricular  size is normal. There is mildly elevated pulmonary artery systolic  pressure. The estimated right ventricular systolic pressure is 78.6 mmHg.   3. The mitral valve is grossly normal. No evidence of mitral valve  regurgitation. No evidence of mitral stenosis.   4. The aortic valve is tricuspid. There is mild calcification of the  aortic valve. There is mild thickening of the aortic valve. Aortic valve  regurgitation is not visualized. Aortic valve sclerosis/calcification is  present, without any evidence of  aortic stenosis.   5. The inferior vena cava is normal in size with greater than 50%  respiratory variability, suggesting right atrial pressure of 3 mmHg.   Comparison(s): No significant change from prior study.   FINDINGS   Left Ventricle: Left ventricular ejection fraction, by estimation, is 60  to 65%. The left ventricle has normal function. The left ventricle has no  regional wall motion abnormalities. The left ventricular internal cavity  size was normal in size. There is   mild concentric left ventricular hypertrophy. Left ventricular  diastolic  function could not be evaluated due to atrial fibrillation. Left  ventricular diastolic function could not be evaluated.   Right Ventricle: The right ventricular size is normal. No increase in  right ventricular wall thickness. Right ventricular systolic function is  normal. There is mildly  elevated pulmonary artery systolic pressure. The  tricuspid regurgitant velocity is 2.90   m/s, and with an assumed right atrial pressure of 3 mmHg, the estimated  right ventricular systolic pressure is 12.4 mmHg.   Left Atrium: Left atrial size was normal in size.   Right Atrium: Right atrial size was normal in size.   Pericardium: There is no evidence of pericardial effusion.   Mitral Valve: The mitral valve is grossly normal. No evidence of mitral  valve regurgitation. No evidence of mitral valve stenosis.   Tricuspid Valve: The tricuspid valve is grossly normal. Tricuspid valve  regurgitation is mild . No evidence of tricuspid stenosis.   Aortic Valve: The aortic valve is tricuspid. There is mild calcification  of the aortic valve. There is mild thickening of the aortic valve. Aortic  valve regurgitation is not visualized. Aortic valve  sclerosis/calcification is present, without any  evidence of aortic stenosis. Aortic valve mean gradient measures 5.0 mmHg.  Aortic valve peak gradient measures 9.7 mmHg. Aortic valve area, by VTI  measures 2.75 cm.   Pulmonic Valve: The pulmonic valve was grossly normal. Pulmonic valve  regurgitation is not visualized. No evidence of pulmonic stenosis.   Aorta: The aortic root and ascending aorta are structurally normal, with  no evidence of dilitation.   Venous: The inferior vena cava is normal in size with greater than 50%  respiratory variability, suggesting right atrial pressure of 3 mmHg.   IAS/Shunts: The atrial septum is grossly normal.     Patient Profile     89 y.o. male with PMH of 3vCAD, paroxsymal Afib, HTN, HLD who  presented with chest pain and found to have elevated troponin and anemia.   Assessment & Plan    NSTEMI- type 2 -- presented with chest pain- hsTn 93>>425>>651>>609 flat trend. EKG without significant ischemia. Pain free since admission. Suspect this is demand ischemia in the setting of new anemia. Known CAD on prior cath with severe diffuse LAD and LCx disease- not amenable to PCI.  Echo with LVEF of 60-65%, no rWMA noted. Given his acute anemia, would not pursue invasive work up at this time. Keep off anticoagulation -- continue metoprolol, statin, Imdur -- no recurrent chest pain since admission   Iron deficiency anemia.  -- presented with dyspnea, noted to have drop in Hgb from 13>>8.6 over the past year. Denies any over bleeding PTA. Notes progressive fatigue over the past month -- seen by GI, [facility]- endoscopy showed a large fungating mass in the colon c/w CA. Multiple polyps. Biopsy taken. -- plan CT abd/pelvis. --surgical consult-now s/p partial colectomy- tolerated well.  -- on protonix -- Hgb improved to 9.5 initially but has dropped back to 8.1    Paroxsymal afib -- EKG read as afib, but review of telemetry shows sinus with 1st degree AVB with freq PACs/PVCs -- Eliquis on hold.    HTN -- elevated in the ED -- continue metoprolol and Imdur. No room to increase BB with bradycardia   AKI -- Cr 1.49, improved to 1.16        For questions or updates, please contact [facility] Please consult [email or web address] for contact info under        Signed, Criag Wicklund [place], MD  08/03/2022, 4:15 PM

## 2022-08-03 NOTE — Progress Notes (Signed)
   Palliative Medicine Inpatient Follow Up Note HPI: 89 y.o. male   admitted on 07/30/2022 with severe CAD, 3 vessels, remote PCI, hypertension, probable right kidney RCC on surveillance, gallstones, A-fib on Eliquis, sleep apnea admitted with symptomatic heme positive IDA. Remote colon polyps and newly identified fungating mass. NSTEMI presentation felt secondary to demand in the setting of profound anemia. Patient and his family face treatment option decisions,  advanced directive decisions and anticipatory care needs.  Today's Discussion 08/03/2022  *Please note that this is a verbal dictation therefore any spelling or grammatical errors are due to the "[place] [facility]" system interpretation.  Chart reviewed inclusive of vital signs, progress notes, laboratory results, and diagnostic images.  Per chart review pathology results back yesterday in late afternoon (+) adenocarcinoma.   I spoke to patients RN this morning who shares that Montoya will go to surgery around 9AM.   I met with Nicholaus this morning at bedside. He was resting comfortably and not in any distress. He vocalizes the plan for surgery today. Discussed that we will remain involved and follow up throughout the weekend to see how he does.   Questions and concerns addressed/Palliative Support Provided.   Objective Assessment: Vital Signs Vitals:   08/03/22 0827 08/03/22 0838  BP:  (!) 143/59  Pulse: 70 71  Resp:  (!) 23  Temp:  98.4 F (36.9 C)  SpO2:  94%    Intake/Output Summary (Last 24 hours) at 08/03/2022 1638 Last data filed at 08/03/2022 4536 Gross per 24 hour  Intake 480 ml  Output 1600 ml  Net -1120 ml    Last Weight  Most recent update: 08/03/2022  6:13 AM    Weight  110.5 kg (243 lb 9.7 oz)            Gen: Elderly caucasian M in NAD HEENT: moist mucous membranes CV: Regular rate and irregular rhythm  PULM: On RA, breathing is even and nonlabored ABD: soft/nontender EXT: No edema Neuro: Alert and  oriented x3 - hard of hearing  SUMMARY OF RECOMMENDATIONS   DNAR/DNI  Plan for surgery this morning   Ongoing PMT support  Billing based on MDM: Moderate ______________________________________________________________________________________ [place] Team Team Cell Phone: [phone] Please utilize secure chat with additional questions, if there is no response within 30 minutes please call the above phone number  Palliative Medicine Team providers are available by phone from 7am to 7pm daily and can be reached through the team cell phone.  Should this patient require assistance outside of these hours, please call the patient's attending physician.

## 2022-08-03 NOTE — Plan of Care (Signed)
  Problem: Coping: Goal: Ability to adjust to condition or change in health will improve Outcome: Progressing   Problem: Fluid Volume: Goal: Ability to maintain a balanced intake and output will improve Outcome: Progressing   Problem: Education: Goal: Knowledge of General Education information will improve Description: Including pain rating scale, medication(s)/side effects and non-pharmacologic comfort measures Outcome: Progressing   Problem: Clinical Measurements: Goal: Cardiovascular complication will be avoided Outcome: Progressing   Problem: Coping: Goal: Level of anxiety will decrease Outcome: Progressing   Problem: Elimination: Goal: Will not experience complications related to urinary retention Outcome: Progressing

## 2022-08-03 NOTE — Op Note (Signed)
08/03/2022  11:49 AM  PATIENT:  Ryan Garcia  89 y.o. male  PRE-OPERATIVE DIAGNOSIS:  Ascending colon mass  POST-OPERATIVE DIAGNOSIS:  Ascending colon mass  PROCEDURE:  Procedure(s): LAPAROSCOPIC ASSISTED RIGHT COLECTOMY  SURGEON:  Surgeon(s) and Role:    Jovita Kussmaul, MD - Primary  PHYSICIAN ASSISTANT:   ASSISTANTS: none   ANESTHESIA:   local and general  EBL:  20 mL   BLOOD ADMINISTERED:none  DRAINS: none   LOCAL MEDICATIONS USED:  MARCAINE     SPECIMEN:  Source of Specimen:  Terminal ileum and right colon  DISPOSITION OF SPECIMEN:  PATHOLOGY  COUNTS:  YES  TOURNIQUET:  * No tourniquets in log *  DICTATION: .Dragon Dictation  After informed consent was obtained the patient was brought to the operating room and placed in the supine position on the operating table.  After adequate induction of general anesthesia the patient's abdomen was prepped with ChloraPrep, allowed to dry, and draped in usual sterile manner.  An appropriate timeout was performed.  The area above the umbilicus was infiltrated with quarter percent Marcaine.  A small incision was made with a 15 blade knife.  The incision was carried through the subcutaneous tissue bluntly with a hemostat and [employer] retractors until the linea alba was identified.  The linea alba was incised with the 15 blade knife and each site was grasped with Kocher clamps and elevated anteriorly.  The peritoneal space was probed bluntly with a hemostat until the peritoneum was opened and access was gained to the abdominal cavity.  A 0 Vicryl pursestring stitch was placed in the fascia around the opening.  A Hassan cannula was placed through the opening and anchored in place with the Vicryl pursestring stitch.  The abdomen was then insufflated with carbon dioxide without difficulty.  Next a site in the right suprapubic area was infiltrated with quarter percent Marcaine.  A small stab incision was made with a 15 blade knife and a 5 mm  port was placed bluntly through this incision into the abdominal cavity under direct vision.  Another 5 mm port was placed in the left upper quadrant in a similar manner.  I was unable to use the harmonic scalpel to mobilize the right colon by incising its retroperitoneal attachment along the white line of Toldt.  Once the right colon appeared to be adequately mobilized I then made the supraumbilical incision slightly larger.  During the dissection we did see the large cyst on the right kidney but were able to separate the colon from it easily.  The supraumbilical incision was then opened with the electrocautery under direct vision.  A wound protector was deployed.  I was able to to identify the right colon and bring it out through the incision.  I chose a site on the terminal ileum for division of the small bowel.  The mesentery at this point was opened sharply with the electrocautery and the small bowel was divided with a single firing of a GIA 75 stapler.  A site was chosen just past the tumor and hepatic flexure for division of the transverse colon.  The mesentery at this point was opened sharply with the electrocautery.  The colon was then divided with a single firing of a GIA 75 stapler.  The mesentery to the right colon was taken down sharply with the LigaSure.  The duodenum was noted to be well out of the way  Once the specimen was removed it was then sent to pathology  for further evaluation.  The terminal ileum and transverse colon approximated each other easily.  They were held in place with 2-0 silk stitches.  A small opening was then made on the antimesenteric edge of the small bowel and colon.  Each limb of a GIA 75 stapler was then placed down the appropriate limb of small bowel and colon, clamped, and fired thereby creating a widely patent enteroenterostomy.  The common opening was then closed with a single firing of a TA 60 stapler.  The staple line was then imbricated with 2-0 silk Lembert stitches  and a 2-0 silk crotch stitch.  The mesenteric defect was closed with 2-0 silk interrupted stitches.  The anastomosis appeared widely patent and healthy.  It was placed back in the right upper quadrant.  The operative area was examined and felt to be hemostatic.  The abdomen was then irrigated with copious amounts of saline.  The ports were removed and new clean drapes were applied as well as a new gowns and gloves.  The fascia of the anterior abdominal wall was then closed with 2 running #1 double-stranded looped PDS sutures.  The subcutaneous tissue was irrigated with saline and the skin incisions were closed with staples.  Sterile dressings were applied.  The patient tolerated the procedure well.  At the end of the case all needle sponge and instrument counts were correct.  The patient was then awakened and taken to recovery in stable condition.  PLAN OF CARE: Admit to inpatient   PATIENT DISPOSITION:  PACU - hemodynamically stable.   Delay start of Pharmacological VTE agent (>24hrs) due to surgical blood loss or risk of bleeding: no

## 2022-08-04 ENCOUNTER — Encounter (HOSPITAL_COMMUNITY): Payer: Self-pay | Admitting: General Surgery

## 2022-08-04 DIAGNOSIS — I214 Non-ST elevation (NSTEMI) myocardial infarction: Secondary | ICD-10-CM | POA: Diagnosis not present

## 2022-08-04 DIAGNOSIS — Z515 Encounter for palliative care: Secondary | ICD-10-CM | POA: Diagnosis not present

## 2022-08-04 DIAGNOSIS — Z7189 Other specified counseling: Secondary | ICD-10-CM | POA: Diagnosis not present

## 2022-08-04 LAB — GLUCOSE, CAPILLARY
Glucose-Capillary: 241 mg/dL — ABNORMAL HIGH (ref 70–99)
Glucose-Capillary: 207 mg/dL — ABNORMAL HIGH (ref 70–99)
Glucose-Capillary: 213 mg/dL — ABNORMAL HIGH (ref 70–99)
Glucose-Capillary: 233 mg/dL — ABNORMAL HIGH (ref 70–99)
Glucose-Capillary: 248 mg/dL — ABNORMAL HIGH (ref 70–99)
Glucose-Capillary: 203 mg/dL — ABNORMAL HIGH (ref 70–99)
Glucose-Capillary: 167 mg/dL — ABNORMAL HIGH (ref 70–99)

## 2022-08-04 LAB — CBC
HCT: 30.2 % — ABNORMAL LOW (ref 39.0–52.0)
Hemoglobin: 9.2 g/dL — ABNORMAL LOW (ref 13.0–17.0)
MCH: 25 pg — ABNORMAL LOW (ref 26.0–34.0)
MCHC: 30.5 g/dL (ref 30.0–36.0)
MCV: 82.1 fL (ref 80.0–100.0)
Platelets: 188 10*3/uL (ref 150–400)
RBC: 3.68 MIL/uL — ABNORMAL LOW (ref 4.22–5.81)
RDW: 15.9 % — ABNORMAL HIGH (ref 11.5–15.5)
WBC: 13.1 10*3/uL — ABNORMAL HIGH (ref 4.0–10.5)
nRBC: 0 % (ref 0.0–0.2)

## 2022-08-04 LAB — BASIC METABOLIC PANEL
Anion gap: 11 (ref 5–15)
BUN: 13 mg/dL (ref 8–23)
CO2: 18 mmol/L — ABNORMAL LOW (ref 22–32)
Calcium: 8.4 mg/dL — ABNORMAL LOW (ref 8.9–10.3)
Chloride: 109 mmol/L (ref 98–111)
Creatinine, Ser: 1.26 mg/dL — ABNORMAL HIGH (ref 0.61–1.24)
GFR, Estimated: 56 mL/min — ABNORMAL LOW (ref >60)
Glucose, Bld: 242 mg/dL — ABNORMAL HIGH (ref 70–99)
Potassium: 3.9 mmol/L (ref 3.5–5.1)
Sodium: 138 mmol/L (ref 135–145)

## 2022-08-04 MED ORDER — INSULIN DETEMIR 100 UNIT/ML ~~LOC~~ SOLN
10.0000 [IU] | Freq: Every day | SUBCUTANEOUS | Status: DC
  Administered 2022-08-04 – 2022-08-06 (×3): 10 [IU] via SUBCUTANEOUS
  Filled 2022-08-04 (×3): qty 0.1

## 2022-08-04 NOTE — Progress Notes (Signed)
Subjective No acute events. Feeling well. No n/v. Reports flatus, no BM. Asking when he can have something to drink - hungry and thirsty  Objective: Vital signs in last 24 hours: Temp:  [97.6 F (36.4 C)-98.7 F (37.1 C)] 98.3 F (36.8 C) (02/03 0734) Pulse Rate:  [50-86] 70 (02/03 0734) Resp:  [11-23] 20 (02/03 0734) BP: (128-168)/(53-77) 158/64 (02/03 0734) SpO2:  [94 %-98 %] 94 % (02/03 0734) Weight:  [112.8 kg] 112.8 kg (02/03 0543) Last BM Date : 08/02/22  Intake/Output from previous day: 02/02 0701 - 02/03 0700 In: 2372.2 [I.V.:2002.2; IV Piggyback:370] Out: 1941 [Urine:1725; Blood:20] Intake/Output this shift: No intake/output data recorded.  Gen: NAD, comfortable CV: RRR Pulm: Normal work of breathing Abd: Soft, minimal tenderness; not significantly distended; wound c/d/I without erythema nor drainage Ext: SCDs in place  Lab Results: CBC  Recent Labs    08/03/22 0044 08/04/22 0019  WBC 6.2 13.1*  HGB 8.0* 9.2*  HCT 26.2* 30.2*  PLT 170 188   BMET Recent Labs    08/03/22 0044 08/04/22 0019  NA 138 138  K 4.0 3.9  CL 110 109  CO2 21* 18*  GLUCOSE 196* 242*  BUN 12 13  CREATININE 1.23 1.26*  CALCIUM 8.3* 8.4*   PT/INR No results for input(s): "LABPROT", "INR" in the last 72 hours. ABG No results for input(s): "PHART", "HCO3" in the last 72 hours.  Invalid input(s): "PCO2", "PO2"  Studies/Results:  Anti-infectives: Anti-infectives (From admission, onward)    Start     Dose/Rate Route Frequency Ordered Stop   08/03/22 0600  cefoTEtan (CEFOTAN) 2 g in sodium chloride 0.9 % 100 mL IVPB        2 g 200 mL/hr over 30 Minutes Intravenous On call to O.R. 08/02/22 1527 08/03/22 1030        Assessment/Plan: Patient Active Problem List   Diagnosis Date Noted   Ascending colon malignant neoplasm ([place]) 08/01/2022   Benign neoplasm of transverse colon 08/01/2022   Benign neoplasm of sigmoid colon 08/01/2022   Benign neoplasm of rectum  08/01/2022   Duodenitis 08/01/2022   Right shoulder pain 11/21/2021   Carpal tunnel syndrome 11/21/2021   Atrial flutter with rapid ventricular response (HCC)    Rapid atrial fibrillation ([place]) 10/11/2020   Poor balance 07/26/2020   Anemia 04/20/2020   Renal mass, right 04/18/2020   AKI (acute kidney injury) ([place]) 04/14/2020   Cholangitis 04/14/2020   Unstable angina (HCC)    Chest pain 04/11/2020   Coronary artery disease involving native coronary artery of native heart without angina pectoris 01/18/2019   Medicare annual wellness visit, subsequent 01/16/2019   Obesity (BMI 30-39.9) 07/09/2018   HOH (hard of hearing) 03/31/2018   Uncontrolled type 2 diabetes mellitus with hyperglycemia ([place]) 10/28/2017   Subclinical hypothyroidism 10/28/2017   Scoliosis    Obstructive sleep apnea on CPAP    Hx of skin cancer, basal cell    Hyperlipidemia associated with type 2 diabetes mellitus (HCC)    GERD (gastroesophageal reflux disease)    Chronic upper back pain    Arthritis    Allergy    NSTEMI (non-ST elevated myocardial infarction) ([place]) 06/18/2017   Actinic keratoses 04/29/2017   Positive colorectal cancer screening using Cologuard test 10/31/2016   BPH (benign prostatic hyperplasia) 09/04/2016   B12 deficiency 11/22/2015   Routine general medical examination at a health care facility 08/26/2015   Abnormal nuclear cardiac imaging test    Dyspnea on exertion 11/27/2013   Encounter for  examination of normal volunteer in research study 05/29/2013   Prostate cancer screening 07/15/2012   CAD S/P percutaneous coronary angioplasty 05/17/2011   Nonspecific abnormal results of cardiovascular function study 04/30/2011   Abnormal EKG 04/06/2011   Right low back pain 01/13/2010   Insulin dependent diabetes mellitus 10/04/2006   ERECTILE DYSFUNCTION 10/04/2006   Primary hypertension 10/04/2006   Allergic rhinitis 10/04/2006   OSTEOARTHRITIS 10/04/2006   Sleep apnea 10/04/2006   EDEMA  10/04/2006   SKIN CANCER, HX OF 10/04/2006   s/p Procedure(s): OPEN PARTIAL COLECTOMY LAPAROSCOPIC LYSIS OF ADHESIONS 08/03/2022  POD#1  -Doing well -Ambulate 5x/day as able -Sips of diabetic liquids - encouraged not to drink much to start and see how things go -MIVF -Path pending  -PPX: SCDs; ok for chemical dvt prophylaxis; holding eliquis until 2/4 assuming hgb remains stable - will reassess daily   LOS: 4 days   Nadeen Landau, MD [facility], [place]

## 2022-08-04 NOTE — Progress Notes (Signed)
PROGRESS NOTE    Ryan Garcia  JKK:[id number] DOB: 1933-12-26 DOA: 07/30/2022 PCP: Abner Greenspan, MD    Brief Narrative:  89 y.o. male with medical history significant for triple-vessel coronary artery disease status post PCI with stent placement, paroxysmal A-fib on Eliquis, chronic diastolic CHF, hypertension, hyperlipidemia, OSA on CPAP, right renal mass diagnosed 2 years ago, followed by urology presented with exertional chest pain.  On presentation, EKG did not show any evidence of acute ischemia but troponins were 93 and subsequently 425. Hemoglobin was 8.7 (from baseline of 13.1).  Eliquis was held.  He was started on IV Protonix. CT chest abdomen and pelvis with contrast showed no evidence of active bleeding however showed 2.5 x 2.6 cm enhancing lesion in the anterior right upper kidney grossly unchanged from prior MRI compatible with solid renal neoplasm such as renal cell carcinoma.  Cardiology and GI were consulted.  Palliative care was also consulted for goals of care discussion.   1/31: Underwent EGD that was normal biopsy obtained,Colonoscopy showed Malignant tumor in the mid descending colon biopsied, had polyps in transverse colon and sigmoid colon,CEA  and surgery consult obtained.  Patient had CT chest abdomen pelvis showing renal lesion but without suspicion for metastatic disease.  Palliative care consulted> patient interested to pursue surgical option.  Patient underwent laparoscopic colectomy 2/2.  Also followed by cardiology for non-STEMI.     Assessment & Plan:   Malignant right colonic mass: S/p egs/ colo 1/31:EGD  normal ,Colonoscopy: Malignant tumor in the mid descending colon biopsied, had polyps in transverse colon and sigmoid colon,.CT chest abdomen pelvis showing renal lesion but without suspicion for metastatic disease.  CEA 6.1. S/p OR lap assisted rt colectomy 08/03/22. Cont diet, pain control per CCS. surgical biopsy pending.   NSTEMI type II/demand ischemia:  Cardiology on board troponin 93 peaked to 651> 609 flat trend EKG without significant ischemia, chest pain on admission.  Per cardiology likely demand ischemia in the setting of anemia with known CAD on  prior cath severe diffuse LAD and LCx disease.  Echo with EF 60 to 65%, no RWMA, due to anemia not pursuing invasive workup,.  Continue on metoprolol, statin and Imdur.   Iron deficiency anemia: Likely due to #1.  Receiving iron transfusions.  Will continue close monitoring.  Eliquis on hold.   Chronic diastolic ZJI:RCVELFYBOFB monitor intake output Daily weight continue Imdur and metoprolol.wt stable/improving as below   Acute kidney injury likely from dehydration baseline 1.1.  Fluctuating renal functions likely due to surgical distress.  Continue maintenance IV fluids today.  Recheck levels tomorrow morning.  Paroxysmal atrial fibrillation: Eliquis on hold> follow-up with surgery to resume postop hopefully soon if hemoglobin is stable.   Hypertension: BP stable, continue Imdur and metoprolol.  HLD continue statin  Right renal mass concerning for renal cell carcinoma follow-up with urology as outpatient  OSA continue CPAP nightly  Type 2 diabetes mellitus: A1c 7.8 blood sugar stable on SSI Blood sugars are persistently more than 150.  Patient is on insulin, glipizide at home.  Currently remains on sliding scale insulin. Add Lantus 10 units daily.  Will continue to uptitrate once patient is able to eat.   Physical deconditioning continue PT OT, recommended home after clinically stable.   Goals of care palliative care following currently DNR.  Desires all clinical treatment.    DVT prophylaxis: enoxaparin (LOVENOX) injection 40 mg Start: 08/04/22 0800 SCD's Start: 08/03/22 1323   Code Status: DNR Family Communication: Wife at  the bedside Disposition Plan: Status is: Inpatient Remains inpatient appropriate because: Immediate postop     Consultants:  Palliative care General  surgery Cardiology  Procedures:  Laparoscopic colectomy 2/2  Antimicrobials:  Perioperative   Subjective: Patient seen in the morning rounds.  Denies any complaints.  Wife at the bedside.  Patient tells me his pain is controlled.  Denies any nausea or vomiting.  No bowel movements or flatus.  Eager to eat.  He was started on clears but has not tried anything yet.  Objective: Vitals:   08/03/22 2200 08/03/22 2328 08/04/22 0543 08/04/22 0734  BP: (!) 155/72 (!) 167/68 (!) 165/68 (!) 158/64  Pulse: 86 86 65 70  Resp: '18 20 20 20  '$ Temp:  98.6 F (37 C) 97.6 F (36.4 C) 98.3 F (36.8 C)  TempSrc:  Oral Oral Oral  SpO2: 97% 96% 94% 94%  Weight:   112.8 kg   Height:        Intake/Output Summary (Last 24 hours) at 08/04/2022 1043 Last data filed at 08/04/2022 0900 Gross per 24 hour  Intake 2002.16 ml  Output 1595 ml  Net 407.16 ml   Filed Weights   08/03/22 0334 08/03/22 0611 08/04/22 0543  Weight: 111.1 kg 110.5 kg 112.8 kg    Examination:  General exam: Appears calm and comfortable  Sitting in chair.  On room air. Respiratory system: Clear to auscultation. Respiratory effort normal.  No added sounds. Cardiovascular system: S1 & S2 heard, RRR.  No pedal edema. Gastrointestinal system: Distended.  Soft.  Mildly tender.  Ports are clean and dry.  No bowel sounds.   Central nervous system: Alert and oriented. No focal neurological deficits. Extremities: Symmetric 5 x 5 power. Skin: No rashes, lesions or ulcers Psychiatry: Judgement and insight appear normal. Mood & affect appropriate.     Data Reviewed: I have personally reviewed following labs and imaging studies  CBC: Recent Labs  Lab 07/30/22 1655 07/31/22 0336 07/31/22 0447 07/31/22 1933 08/01/22 0041 08/02/22 0039 08/03/22 0044 08/04/22 0019  WBC 6.8 6.0  --   --  7.7 8.0 6.2 13.1*  NEUTROABS 5.2  --   --   --   --  6.1  --   --   HGB 8.7* 8.2*   < > 9.2* 9.5* 8.1* 8.0* 9.2*  HCT 29.6* 27.0*   < > 30.2*  31.0* 26.9* 26.2* 30.2*  MCV 84.6 83.6  --   --  81.8 82.8 81.6 82.1  PLT 205 178  --   --  198 182 170 188   < > = values in this interval not displayed.   Basic Metabolic Panel: Recent Labs  Lab 07/31/22 0336 08/01/22 0041 08/02/22 0039 08/03/22 0044 08/04/22 0019  NA 140 141 136 138 138  K 4.0 4.1 3.9 4.0 3.9  CL 106 109 107 110 109  CO2 '24 22 23 '$ 21* 18*  GLUCOSE 274* 186* 183* 196* 242*  BUN '22 17 13 12 13  '$ CREATININE 1.14 1.16 1.16 1.23 1.26*  CALCIUM 8.7* 9.0 8.2* 8.3* 8.4*  MG 2.1 1.9 1.7 1.9  --   PHOS 3.2  --   --   --   --    GFR: Estimated Creatinine Clearance: 53.8 mL/min (A) (by C-G formula based on SCr of 1.26 mg/dL (H)). Liver Function Tests: Recent Labs  Lab 07/31/22 0336 08/01/22 0041  AST 23 24  ALT 16 18  ALKPHOS 75 78  BILITOT 0.4 0.9  PROT 5.8* 6.1*  ALBUMIN 3.1* 3.3*   No results for input(s): "LIPASE", "AMYLASE" in the last 168 hours. No results for input(s): "AMMONIA" in the last 168 hours. Coagulation Profile: Recent Labs  Lab 07/31/22 0130  INR 1.2   Cardiac Enzymes: No results for input(s): "CKTOTAL", "CKMB", "CKMBINDEX", "TROPONINI" in the last 168 hours. BNP (last 3 results) No results for input(s): "PROBNP" in the last 8760 hours. HbA1C: No results for input(s): "HGBA1C" in the last 72 hours. CBG: Recent Labs  Lab 08/03/22 1614 08/03/22 2037 08/03/22 2337 08/04/22 0457 08/04/22 0730  GLUCAP 251* 260* 208* 241* 207*   Lipid Profile: No results for input(s): "CHOL", "HDL", "LDLCALC", "TRIG", "CHOLHDL", "LDLDIRECT" in the last 72 hours. Thyroid Function Tests: No results for input(s): "TSH", "T4TOTAL", "FREET4", "T3FREE", "THYROIDAB" in the last 72 hours. Anemia Panel: No results for input(s): "VITAMINB12", "FOLATE", "FERRITIN", "TIBC", "IRON", "RETICCTPCT" in the last 72 hours. Sepsis Labs: Recent Labs  Lab 07/31/22 0130 07/31/22 0336  LATICACIDVEN 1.4 1.2    Recent Results (from the past 240 hour(s))  MRSA Next  Gen by PCR, Nasal     Status: None   Collection Time: 07/31/22  4:30 PM   Specimen: Nasal Mucosa; Nasal Swab  Result Value Ref Range Status   MRSA by PCR Next Gen NOT DETECTED NOT DETECTED Final    Comment: (NOTE) The GeneXpert MRSA Assay (FDA approved for NASAL specimens only), is one component of a comprehensive MRSA colonization surveillance program. It is not intended to diagnose MRSA infection nor to guide or monitor treatment for MRSA infections. Test performance is not FDA approved in patients less than 72 years old. Performed at [facility], [place] [address] [place], [place] [address]          Radiology Studies: No results found.      Scheduled Meds:  Chlorhexidine Gluconate Cloth  6 each Topical Q0600   enoxaparin (LOVENOX) injection  40 mg Subcutaneous Q24H   insulin aspart  0-15 Units Subcutaneous Q4H   insulin detemir  10 Units Subcutaneous Daily   isosorbide mononitrate  30 mg Oral Daily   metoprolol tartrate  12.5 mg Oral BID   pantoprazole  40 mg Oral Q0600   rosuvastatin  40 mg Oral Daily   Continuous Infusions:  sodium chloride 75 mL/hr at 08/04/22 0458   ferric gluconate (FERRLECIT) IVPB 250 mg (08/04/22 0933)     LOS: 4 days    Time spent: 35 minutes    Barb Merino, MD [facility] Pager [phone]

## 2022-08-04 NOTE — Evaluation (Signed)
Occupational Therapy Evaluation Patient Details Name: Ryan Garcia MRN: [id number] DOB: 06/01/1934 Today's Date: 08/04/2022   History of Present Illness Pt is an 89 y.o. male who presented 07/30/22 with exertional chest pain and found to have elevated troponin and anemia. Pt admitted with NSTEMI and found ascending colon mass concerning for malignancy s/p endoscopy and colonoscopy 1/31. R colectomy on 2/2. PMH: arthritis, CAD, CKD, DM, GERD, HLD, hx of skin cancer, HTN, NSTEMI, paroxysmal atrial flutter, renal mass, scoliosis   Clinical Impression   PTA, pt lives with spouse, typically Independent with ADLs, IADLs and mobility. Pt presents now with minor post op soreness. Overall, pt able to mobilize in room and out in hallway with IV pole for support. Pt requires Setup for UB ADL and Min-Mod A for LB ADLs with wife assisting during evaluation. Educated on placing toilet riser over toilet at home, wife assist for LB ADLs and tub transfers. Anticipate no OT needs at DC but will follow acutely to maximize ADL/mobility independence.  SpO2 WFL on RA      Recommendations for follow up therapy are one component of a multi-disciplinary discharge planning process, led by the attending physician.  Recommendations may be updated based on patient status, additional functional criteria and insurance authorization.   Follow Up Recommendations  No OT follow up     Assistance Recommended at Discharge PRN  Patient can return home with the following A little help with bathing/dressing/bathroom;Assistance with cooking/housework;Help with stairs or ramp for entrance    Functional Status Assessment  Patient has had a recent decline in their functional status and demonstrates the ability to make significant improvements in function in a reasonable and predictable amount of time.  Equipment Recommendations  None recommended by OT    Recommendations for Other Services       Precautions / Restrictions  Precautions Precautions: Fall;Other (comment) Precaution Comments: monitor O2 Restrictions Weight Bearing Restrictions: No      Mobility Bed Mobility Overal bed mobility: Needs Assistance Bed Mobility: Supine to Sit     Supine to sit: Min assist, HOB elevated     General bed mobility comments: handheld assist to lift trunk. pt elevating HOB very high to sit EOB    Transfers Overall transfer level: Needs assistance Equipment used: Rolling walker (2 wheels) Transfers: Sit to/from Stand Sit to Stand: Min assist           General transfer comment: initially used RW to stand then pushed DME away to walk to sink      Balance Overall balance assessment: Mild deficits observed, not formally tested                                         ADL either performed or assessed with clinical judgement   ADL Overall ADL's : Needs assistance/impaired Eating/Feeding: Independent   Grooming: Min guard;Standing;Brushing hair Grooming Details (indicate cue type and reason): standing at sink > 6 min Upper Body Bathing: Set up   Lower Body Bathing: Minimal assistance;Sit to/from stand   Upper Body Dressing : Set up;Sitting   Lower Body Dressing: Moderate assistance;Sit to/from stand Lower Body Dressing Details (indicate cue type and reason): wife assisting to don underwear around pt feet. initially were placed on backwards w/ pt opting to stand at end of session to have them switched. pt endorses difficulty standing to dress. educated pt to perform these tasks  seated when possible to decrease fall risk Toilet Transfer: Min guard;Ambulation   Toileting- Clothing Manipulation and Hygiene: Minimal assistance;Sit to/from stand Toileting - Clothing Manipulation Details (indicate cue type and reason): assist w/ clothing mgmt while using urinal in standing     Functional mobility during ADLs: Min guard General ADL Comments: minor deficits w/ LB ADLs due to abdominal  soreness though moving well     Vision Ability to See in Adequate Light: 0 Adequate Patient Visual Report: No change from baseline Vision Assessment?: No apparent visual deficits     Perception     Praxis      Pertinent Vitals/Pain Pain Assessment Pain Assessment: Faces Faces Pain Scale: Hurts a little bit Pain Location: abdomen Pain Descriptors / Indicators: Sore Pain Intervention(s): Monitored during session     Hand Dominance Right   Extremity/Trunk Assessment Upper Extremity Assessment Upper Extremity Assessment: Overall WFL for tasks assessed   Lower Extremity Assessment Lower Extremity Assessment: Defer to PT evaluation   Cervical / Trunk Assessment Cervical / Trunk Assessment: Normal   Communication Communication Communication: HOH   Cognition Arousal/Alertness: Awake/alert Behavior During Therapy: WFL for tasks assessed/performed Overall Cognitive Status: Within Functional Limits for tasks assessed                                       General Comments  Wife present and supportive    Exercises     Shoulder Instructions      Home Living Family/patient expects to be discharged to:: Private residence Living Arrangements: Spouse/significant other Available Help at Discharge: Family;Available 24 hours/day Type of Home: House Home Access: Stairs to enter [employer] of Steps: 4-5 Entrance Stairs-Rails: Left Home Layout: Laundry or work area in basement;Two level (basement with stair lift) Alternate Level Stairs-Number of Steps: stair lift   Bathroom Shower/Tub: Teacher, early years/pre: Standard Bathroom Accessibility: Yes   Home Equipment: Grab bars - tub/shower;Toilet riser;Grab bars - toilet          Prior Functioning/Environment Prior Level of Function : Independent/Modified Independent             Mobility Comments: No AD. ADLs Comments: enjoys being active, out working in the yard most of the  time per wife        OT Problem List: Decreased activity tolerance;Impaired balance (sitting and/or standing);Pain      OT Treatment/Interventions: Self-care/ADL training;Therapeutic exercise;Energy conservation;DME and/or AE instruction;Therapeutic activities;Patient/family education    OT Goals(Current goals can be found in the care plan section) Acute Rehab OT Goals Patient Stated Goal: get up and move around, have some food OT Goal Formulation: With patient/family Time For Goal Achievement: 08/18/22 Potential to Achieve Goals: Good  OT Frequency: Min 2X/week    Co-evaluation              AM-PAC OT "6 Clicks" Daily Activity     Outcome Measure Help from another person eating meals?: None Help from another person taking care of personal grooming?: A Little Help from another person toileting, which includes using toliet, bedpan, or urinal?: A Little Help from another person bathing (including washing, rinsing, drying)?: A Little Help from another person to put on and taking off regular upper body clothing?: A Little Help from another person to put on and taking off regular lower body clothing?: A Lot 6 Click Score: 18   End of Session Equipment Utilized During  Treatment: Gait belt Nurse Communication: Mobility status  Activity Tolerance: Patient tolerated treatment well Patient left: in chair;with call bell/phone within reach;with chair alarm set;with nursing/sitter in room;with family/visitor present  OT Visit Diagnosis: Unsteadiness on feet (R26.81);Other abnormalities of gait and mobility (R26.89)                Time: 5188-4166 OT Time Calculation (min): 28 min Charges:  OT General Charges $OT Visit: 1 Visit OT Evaluation $OT Eval Moderate Complexity: 1 Mod OT Treatments $Self Care/Home Management : 8-22 mins  [name], OTR/L Acute Rehab Services Office: [phone]   [name] 08/04/2022, 10:12 AM

## 2022-08-04 NOTE — Progress Notes (Signed)
Physical Therapy Treatment Patient Details Name: Ryan Garcia MRN: [id number] DOB: 1933-11-10 Today's Date: 08/04/2022   History of Present Illness Pt is an 89 y.o. male who presented 07/30/22 with exertional chest pain and found to have elevated troponin and anemia. Pt admitted with NSTEMI and found ascending colon mass concerning for malignancy s/p endoscopy and colonoscopy 1/31. R colectomy on 2/2. PMH: arthritis, CAD, CKD, DM, GERD, HLD, hx of skin cancer, HTN, NSTEMI, paroxysmal atrial flutter, renal mass, scoliosis    PT Comments    Pt is now s/p colectomy 2/2. He is demonstrating and reporting some mild abdominal pain, limiting his speed and ease with mobility. Pt did display increased instability when ambulating without UE support, needing up to minA to recover during several LOB bouts. He reports he heavily relies on his vision for his balance. Due to his increased risk for falls, updated d/c recs to HHPT and for pt to d/c with an AD for safer mobility. Will need to further assess whether pt would benefit more from a RW or a cane. Will continue to follow acutely.     Recommendations for follow up therapy are one component of a multi-disciplinary discharge planning process, led by the attending physician.  Recommendations may be updated based on patient status, additional functional criteria and insurance authorization.  Follow Up Recommendations  Home health PT     Assistance Recommended at Discharge Intermittent Supervision/Assistance  Patient can return home with the following Assistance with cooking/housework;Assist for transportation;Help with stairs or ramp for entrance;A little help with walking and/or transfers;A little help with bathing/dressing/bathroom   Equipment Recommendations  Rolling walker (2 wheels)    Recommendations for Other Services       Precautions / Restrictions Precautions Precautions: Fall;Other (comment) Precaution Comments: monitor  O2 Restrictions Weight Bearing Restrictions: No     Mobility  Bed Mobility Overal bed mobility: Needs Assistance Bed Mobility: Rolling, Sidelying to Sit, Sit to Supine Rolling: Supervision Sidelying to sit: Supervision   Sit to supine: Supervision   General bed mobility comments: Extra time due to pain, cues provided to log roll and transition sidelying <> sit to reduce abdominal pain, supervision for safety    Transfers Overall transfer level: Needs assistance Equipment used: None Transfers: Sit to/from Stand Sit to Stand: Min guard           General transfer comment: Extra time to come to stand from EOB, min guard for safety    Ambulation/Gait Ambulation/Gait assistance: Min guard, Min assist Gait Distance (Feet): 460 Feet Assistive device: None Gait Pattern/deviations: Step-through pattern, Decreased stride length, Staggering right, Staggering left Gait velocity: reduced Gait velocity interpretation: 1.31 - 2.62 ft/sec, indicative of limited community ambulator   General Gait Details: Pt with slowed, step-through gait pattern and decreased stride length. He demonstrated increased instability today, taking quick shuffling reactional steps to regain his balance x3 bouts. 1x was when pt looked inferiorly and he began to stumble anteriorly. MinA intermittently provided for stability or to regain balance with LOBbouts. Encouraged use of RW when alone and to have someone guard him when ambulating without UE support   Stairs             Wheelchair Mobility    Modified Rankin (Stroke Patients Only)       Balance Overall balance assessment: Mild deficits observed, not formally tested  Cognition Arousal/Alertness: Awake/alert Behavior During Therapy: WFL for tasks assessed/performed Overall Cognitive Status: Within Functional Limits for tasks assessed                                           Exercises      General Comments General comments (skin integrity, edema, etc.): Educated pt and daughter on his risk for falls and recommendation for HHPT and him to use an AD when mobilizing alone or to have someone guard him if he mobilizes without UE support. They verbalized understanding.      Pertinent Vitals/Pain Pain Assessment Pain Assessment: Faces Faces Pain Scale: Hurts a little bit Pain Location: abdomen Pain Descriptors / Indicators: Sore, Grimacing, Operative site guarding Pain Intervention(s): Limited activity within patient's tolerance, Monitored during session, Repositioned    Home Living Family/patient expects to be discharged to:: Private residence Living Arrangements: Spouse/significant other Available Help at Discharge: Family;Available 24 hours/day Type of Home: House Home Access: Stairs to enter Entrance Stairs-Rails: Left Entrance Stairs-Number of Steps: 4-5 Alternate Level Stairs-Number of Steps: stair lift Home Layout: Laundry or work area in basement;Two level (basement with stair lift) Home Equipment: Grab bars - tub/shower;Toilet riser;Grab bars - toilet      Prior Function            PT Goals (current goals can now be found in the care plan section) Acute Rehab PT Goals Patient Stated Goal: to go home soon PT Goal Formulation: With patient/family Time For Goal Achievement: 08/16/22 Potential to Achieve Goals: Good Progress towards PT goals: Progressing toward goals    Frequency    Min 3X/week      PT Plan Discharge plan needs to be updated;Equipment recommendations need to be updated    Co-evaluation              AM-PAC PT "6 Clicks" Mobility   Outcome Measure  Help needed turning from your back to your side while in a flat bed without using bedrails?: A Little Help needed moving from lying on your back to sitting on the side of a flat bed without using bedrails?: A Little Help needed moving to and from a bed to a  chair (including a wheelchair)?: A Little Help needed standing up from a chair using your arms (e.g., wheelchair or bedside chair)?: A Little Help needed to walk in hospital room?: A Little Help needed climbing 3-5 steps with a railing? : A Little 6 Click Score: 18    End of Session Equipment Utilized During Treatment: Gait belt Activity Tolerance: Patient tolerated treatment well Patient left: with call bell/phone within reach;with family/visitor present;in bed;with bed alarm set Nurse Communication: Mobility status PT Visit Diagnosis: Unsteadiness on feet (R26.81);Other abnormalities of gait and mobility (R26.89);Muscle weakness (generalized) (M62.81)     Time: 2952-8413 PT Time Calculation (min) (ACUTE ONLY): 17 min  Charges:  $Gait Training: 8-22 mins                     Moishe Spice, PT, DPT Acute Rehabilitation Services  Office: [phone]    Orvan Falconer 08/04/2022, 12:48 PM

## 2022-08-04 NOTE — Progress Notes (Signed)
   Palliative Medicine Inpatient Follow Up Note HPI: 89 y.o. male   admitted on 07/30/2022 with severe CAD, 3 vessels, remote PCI, hypertension, probable right kidney RCC on surveillance, gallstones, A-fib on Eliquis, sleep apnea admitted with symptomatic heme positive IDA. Remote colon polyps and newly identified fungating mass. NSTEMI presentation felt secondary to demand in the setting of profound anemia. Patient and his family face treatment option decisions,  advanced directive decisions and anticipatory care needs.  Today's Discussion 08/04/2022  *Please note that this is a verbal dictation therefore any spelling or grammatical errors are due to the "[place] [employer]" system interpretation.  Chart reviewed inclusive of vital signs, progress notes, laboratory results, and diagnostic images.    I met with Ryan Garcia at bedside this morning. He is awake and oriented. He shares that surgery went well, he has some pain but does not note great significance. He denies nause and shortness of breath.     Patient endorses eagerness to have a diet initiated, feels dry in the mouth.  I spoke with Stokes and his spouse, Lelan Pons. They are hopeful that he can mobilize today and have advancement on his diet.  ____________________  Reviewed with RN, Katie later in the morning that patient had mobilized and done very well. She also shares that he has been placed on a CLD.   Questions and concerns addressed/Palliative Support Provided.   Objective Assessment: Vital Signs Vitals:   08/04/22 0543 08/04/22 0734  BP: (!) 165/68 (!) 158/64  Pulse: 65 70  Resp: 20 20  Temp: 97.6 F (36.4 C) 98.3 F (36.8 C)  SpO2: 94% 94%    Intake/Output Summary (Last 24 hours) at 08/04/2022 1016 Last data filed at 08/04/2022 0900 Gross per 24 hour  Intake 2002.16 ml  Output 1595 ml  Net 407.16 ml    Last Weight  Most recent update: 08/04/2022  5:45 AM    Weight  112.8 kg (248 lb 10.9 oz)            Gen: Elderly  caucasian M in NAD HEENT: moist mucous membranes CV: Regular rate and irregular rhythm  PULM: On RA, breathing is even and nonlabored ABD: soft/nontender EXT: No edema Neuro: Alert and oriented x3 - hard of hearing  SUMMARY OF RECOMMENDATIONS   DNAR/DNI  Post Op day 1 --> Doing well thus far has already worked with OT, on CLD presently  Ongoing PMT support  Billing based on MDM: Moderate ______________________________________________________________________________________ [facility] Team Team Cell Phone: [phone] Please utilize secure chat with additional questions, if there is no response within 30 minutes please call the above phone number  Palliative Medicine Team providers are available by phone from 7am to 7pm daily and can be reached through the team cell phone.  Should this patient require assistance outside of these hours, please call the patient's attending physician.

## 2022-08-04 NOTE — Progress Notes (Signed)
MD notified of new blood tinged urine.

## 2022-08-05 DIAGNOSIS — I214 Non-ST elevation (NSTEMI) myocardial infarction: Secondary | ICD-10-CM | POA: Diagnosis not present

## 2022-08-05 DIAGNOSIS — Z515 Encounter for palliative care: Secondary | ICD-10-CM | POA: Diagnosis not present

## 2022-08-05 DIAGNOSIS — Z7189 Other specified counseling: Secondary | ICD-10-CM | POA: Diagnosis not present

## 2022-08-05 LAB — COMPREHENSIVE METABOLIC PANEL
ALT: 13 U/L (ref 0–44)
AST: 13 U/L — ABNORMAL LOW (ref 15–41)
Albumin: 2.6 g/dL — ABNORMAL LOW (ref 3.5–5.0)
Alkaline Phosphatase: 67 U/L (ref 38–126)
Anion gap: 6 (ref 5–15)
BUN: 16 mg/dL (ref 8–23)
CO2: 21 mmol/L — ABNORMAL LOW (ref 22–32)
Calcium: 8.3 mg/dL — ABNORMAL LOW (ref 8.9–10.3)
Chloride: 112 mmol/L — ABNORMAL HIGH (ref 98–111)
Creatinine, Ser: 1.25 mg/dL — ABNORMAL HIGH (ref 0.61–1.24)
GFR, Estimated: 56 mL/min — ABNORMAL LOW (ref >60)
Glucose, Bld: 164 mg/dL — ABNORMAL HIGH (ref 70–99)
Potassium: 3.7 mmol/L (ref 3.5–5.1)
Sodium: 139 mmol/L (ref 135–145)
Total Bilirubin: 0.8 mg/dL (ref 0.3–1.2)
Total Protein: 5.7 g/dL — ABNORMAL LOW (ref 6.5–8.1)

## 2022-08-05 LAB — GLUCOSE, CAPILLARY
Glucose-Capillary: 157 mg/dL — ABNORMAL HIGH (ref 70–99)
Glucose-Capillary: 214 mg/dL — ABNORMAL HIGH (ref 70–99)
Glucose-Capillary: 190 mg/dL — ABNORMAL HIGH (ref 70–99)
Glucose-Capillary: 196 mg/dL — ABNORMAL HIGH (ref 70–99)
Glucose-Capillary: 268 mg/dL — ABNORMAL HIGH (ref 70–99)

## 2022-08-05 LAB — CBC WITH DIFFERENTIAL/PLATELET
Abs Immature Granulocytes: 0.08 10*3/uL — ABNORMAL HIGH (ref 0.00–0.07)
Basophils Absolute: 0 10*3/uL (ref 0.0–0.1)
Basophils Relative: 0 %
Eosinophils Absolute: 0.1 10*3/uL (ref 0.0–0.5)
Eosinophils Relative: 1 %
HCT: 27.4 % — ABNORMAL LOW (ref 39.0–52.0)
Hemoglobin: 8.1 g/dL — ABNORMAL LOW (ref 13.0–17.0)
Immature Granulocytes: 1 %
Lymphocytes Relative: 7 %
Lymphs Abs: 0.7 10*3/uL (ref 0.7–4.0)
MCH: 24.8 pg — ABNORMAL LOW (ref 26.0–34.0)
MCHC: 29.6 g/dL — ABNORMAL LOW (ref 30.0–36.0)
MCV: 84 fL (ref 80.0–100.0)
Monocytes Absolute: 0.9 10*3/uL (ref 0.1–1.0)
Monocytes Relative: 9 %
Neutro Abs: 8.2 10*3/uL — ABNORMAL HIGH (ref 1.7–7.7)
Neutrophils Relative %: 82 %
Platelets: 194 10*3/uL (ref 150–400)
RBC: 3.26 MIL/uL — ABNORMAL LOW (ref 4.22–5.81)
RDW: 16.4 % — ABNORMAL HIGH (ref 11.5–15.5)
WBC: 10 10*3/uL (ref 4.0–10.5)
nRBC: 0 % (ref 0.0–0.2)

## 2022-08-05 LAB — MAGNESIUM: Magnesium: 1.9 mg/dL (ref 1.7–2.4)

## 2022-08-05 MED ORDER — MORPHINE SULFATE (PF) 2 MG/ML IV SOLN: 1.0000 mg | INTRAVENOUS | Status: DC | PRN

## 2022-08-05 MED ORDER — GLUCERNA SHAKE PO LIQD
237.0000 mL | Freq: Two times a day (BID) | ORAL | Status: DC
  Administered 2022-08-05 – 2022-08-10 (×7): 237 mL via ORAL
  Filled 2022-08-05: qty 237

## 2022-08-05 MED ORDER — APIXABAN 5 MG PO TABS
5.0000 mg | ORAL_TABLET | Freq: Two times a day (BID) | ORAL | Status: DC
  Administered 2022-08-05 – 2022-08-07 (×6): 5 mg via ORAL
  Filled 2022-08-05 (×6): qty 1

## 2022-08-05 MED ORDER — OXYCODONE HCL 5 MG PO TABS
5.0000 mg | ORAL_TABLET | ORAL | Status: DC | PRN
  Administered 2022-08-05: 5 mg via ORAL
  Filled 2022-08-05: qty 1

## 2022-08-05 MED ORDER — METHOCARBAMOL 500 MG PO TABS
500.0000 mg | ORAL_TABLET | Freq: Three times a day (TID) | ORAL | Status: DC | PRN
  Administered 2022-08-05: 500 mg via ORAL
  Filled 2022-08-05: qty 1

## 2022-08-05 MED ORDER — INSULIN ASPART 100 UNIT/ML IJ SOLN
0.0000 [IU] | Freq: Three times a day (TID) | INTRAMUSCULAR | Status: DC
  Administered 2022-08-05: 3 [IU] via SUBCUTANEOUS
  Administered 2022-08-05: 8 [IU] via SUBCUTANEOUS
  Administered 2022-08-06: 11 [IU] via SUBCUTANEOUS
  Administered 2022-08-06: 8 [IU] via SUBCUTANEOUS
  Administered 2022-08-06: 5 [IU] via SUBCUTANEOUS
  Administered 2022-08-06: 3 [IU] via SUBCUTANEOUS
  Administered 2022-08-07: 5 [IU] via SUBCUTANEOUS
  Administered 2022-08-07 (×2): 8 [IU] via SUBCUTANEOUS
  Administered 2022-08-07: 3 [IU] via SUBCUTANEOUS
  Administered 2022-08-08: 5 [IU] via SUBCUTANEOUS
  Administered 2022-08-08: 3 [IU] via SUBCUTANEOUS
  Administered 2022-08-08: 8 [IU] via SUBCUTANEOUS

## 2022-08-05 MED ORDER — ACETAMINOPHEN 500 MG PO TABS
1000.0000 mg | ORAL_TABLET | Freq: Four times a day (QID) | ORAL | Status: DC
  Administered 2022-08-05 – 2022-08-11 (×21): 1000 mg via ORAL
  Filled 2022-08-05 (×22): qty 2

## 2022-08-05 NOTE — Progress Notes (Signed)
Pt has a home cpap machine and will place it on self when ready.

## 2022-08-05 NOTE — Progress Notes (Signed)
[facility] Progress Note  2 Days Post-Op  Subjective: Doesn't love the chicken broth.  Passing flatus.  No BM yet.  Objective: Vital signs in last 24 hours: Temp:  [98.4 F (36.9 C)-98.8 F (37.1 C)] 98.6 F (37 C) (02/04 0300) Pulse Rate:  [60-71] 71 (02/04 0300) Resp:  [20-27] 20 (02/04 0300) BP: (120-148)/(52-70) 148/70 (02/04 0300) SpO2:  [91 %-96 %] 94 % (02/04 0400) Weight:  [112.1 kg] 112.1 kg (02/04 0617) Last BM Date : 07/31/22  Intake/Output from previous day: 02/03 0701 - 02/04 0700 In: 1925.5 [P.O.:597; I.V.:1058.4; IV Piggyback:270] Out: 600 [Urine:600] Intake/Output this shift: No intake/output data recorded.  PE: Gen:  Alert, NAD, pleasant Abd: protuberant but soft, NT, +BS, incision c/d/I with staples and honeycomb in place.  Port site covered with gauze  Lab Results:  Recent Labs    08/04/22 0019 08/05/22 0030  WBC 13.1* 10.0  HGB 9.2* 8.1*  HCT 30.2* 27.4*  PLT 188 194   BMET Recent Labs    08/04/22 0019 08/05/22 0030  NA 138 139  K 3.9 3.7  CL 109 112*  CO2 18* 21*  GLUCOSE 242* 164*  BUN 13 16  CREATININE 1.26* 1.25*  CALCIUM 8.4* 8.3*   PT/INR No results for input(s): "LABPROT", "INR" in the last 72 hours.  CMP     Component Value Date/Time   NA 139 08/05/2022 0030   NA 144 12/06/2021 1344   K 3.7 08/05/2022 0030   CL 112 (H) 08/05/2022 0030   CO2 21 (L) 08/05/2022 0030   GLUCOSE 164 (H) 08/05/2022 0030   BUN 16 08/05/2022 0030   BUN 22 12/06/2021 1344   CREATININE 1.25 (H) 08/05/2022 0030   CALCIUM 8.3 (L) 08/05/2022 0030   PROT 5.7 (L) 08/05/2022 0030   PROT 6.5 09/15/2019 0810   ALBUMIN 2.6 (L) 08/05/2022 0030   ALBUMIN 4.1 09/15/2019 0810   AST 13 (L) 08/05/2022 0030   ALT 13 08/05/2022 0030   ALKPHOS 67 08/05/2022 0030   BILITOT 0.8 08/05/2022 0030   BILITOT 0.3 09/15/2019 0810   GFRNONAA 56 (L) 08/05/2022 0030   GFRAA >60 07/03/2018 0304   Lipase     Component Value Date/Time   LIPASE 28  04/16/2020 0111       Studies/Results: No results found.  Anti-infectives: Anti-infectives (From admission, onward)    Start     Dose/Rate Route Frequency Ordered Stop   08/03/22 0600  cefoTEtan (CEFOTAN) 2 g in sodium chloride 0.9 % 100 mL IVPB        2 g 200 mL/hr over 30 Minutes Intravenous On call to O.R. 08/02/22 1527 08/03/22 1030        Assessment/Plan POD 2, s/p lap assisted right colectomy by Dr. Marlou Starks 08/03/22 for Ascending colon mass concerning for malignancy - c-scope path with adenoca as expected, surgical path pending - CEA 6.1. CT chest abdomen pelvis done without findings of metastatic disease.  - adv to FLD per patient request, will have order for ADAT to carb mod as he is able.   -may resume Eliquis today -mobilization, pulm toilet, IS   FEN: FLD, ADAT, glucerna ID: none VTE: may resume Eliquis from surgical standpoint   --Per primary-- NSTEMI - present on admission, likely secondary to demand ischemia. Cardiology following CAD s/p PCI afib on eliquis CHF HTN HLD OSA right renal mass DNR/DNI    LOS: 5 days    Henreitta Cea, [facility] 08/05/2022, 8:18 AM Please  see Amion for pager number during day hours 7:00am-4:30pm

## 2022-08-05 NOTE — Plan of Care (Signed)
  Problem: Fluid Volume: Goal: Ability to maintain a balanced intake and output will improve Outcome: Progressing   Problem: Clinical Measurements: Goal: Will remain free from infection Outcome: Progressing Goal: Diagnostic test results will improve Outcome: Progressing Goal: Respiratory complications will improve Outcome: Progressing Goal: Cardiovascular complication will be avoided Outcome: Progressing   Problem: Activity: Goal: Risk for activity intolerance will decrease Outcome: Progressing   Problem: Coping: Goal: Level of anxiety will decrease Outcome: Progressing   Problem: Pain Managment: Goal: General experience of comfort will improve Outcome: Progressing   Problem: Safety: Goal: Ability to remain free from injury will improve Outcome: Progressing

## 2022-08-05 NOTE — Progress Notes (Signed)
PROGRESS NOTE    Ryan Garcia  HYQ:[id number] DOB: 05/13/1934 DOA: 07/30/2022 PCP: Abner Greenspan, MD    Brief Narrative:  89 y.o. male with medical history significant for triple-vessel coronary artery disease status post PCI with stent placement, paroxysmal A-fib on Eliquis, chronic diastolic CHF, hypertension, hyperlipidemia, OSA on CPAP, right renal mass diagnosed 2 years ago, followed by urology presented with exertional chest pain.  On presentation, EKG did not show any evidence of acute ischemia but troponins were 93 and subsequently 425. Hemoglobin was 8.7 (from baseline of 13.1).  Eliquis was held.  He was started on IV Protonix. CT chest abdomen and pelvis with contrast showed no evidence of active bleeding however showed 2.5 x 2.6 cm enhancing lesion in the anterior right upper kidney grossly unchanged from prior MRI compatible with solid renal neoplasm such as renal cell carcinoma.  Cardiology and GI were consulted.  Palliative care was also consulted for goals of care discussion.   1/31: Underwent EGD that was normal biopsy obtained,Colonoscopy showed Malignant tumor in the mid descending colon biopsied, had polyps in transverse colon and sigmoid colon,CEA  and surgery consult obtained.  Patient had CT chest abdomen pelvis showing renal lesion but without suspicion for metastatic disease.  Palliative care consulted> patient interested to pursue surgical option.  Patient underwent laparoscopic colectomy 2/2.  Also followed by cardiology for non-STEMI.     Assessment & Plan:   Malignant right colonic mass: S/p egs/ colo 1/31:EGD  normal ,Colonoscopy: Malignant tumor in the mid descending colon biopsied, had polyps in transverse colon and sigmoid colon,.CT chest abdomen pelvis showing renal lesion but without suspicion for metastatic disease.  CEA 6.1. S/p OR lap assisted rt colectomy 08/03/22. Cont diet, pain control per CCS. surgical biopsy pending. Advancing to full liquid diet.   Followed by surgery.  Okay to for Eliquis.   NSTEMI type II/demand ischemia: Cardiology was following.  troponin 93 peaked to 651> 609 flat trend EKG without significant ischemia, chest pain on admission.  Per cardiology likely demand ischemia in the setting of anemia with known CAD on  prior cath severe diffuse LAD and LCx disease.  Echo with EF 60 to 65%, no RWMA, due to anemia not pursuing invasive workup,.  Continue on metoprolol, statin and Imdur.   Iron deficiency anemia: Likely due to #1.  Receiving iron transfusions.  Will continue close monitoring.  Resume Eliquis today and monitor for further bleeding.   Chronic diastolic XBM:[id number] monitor intake output Daily weight continue Imdur and metoprolol.wt stable/improving as below   Acute kidney injury likely from dehydration baseline 1.1.  Fluctuating renal functions likely due to surgical distress.  Continue maintenance IV fluids today.  Recheck levels tomorrow morning.  Paroxysmal atrial fibrillation: Eliquis will start today.  Rate controlled on metoprolol.  Hypertension: BP stable, continue Imdur and metoprolol.  HLD continue statin  Right renal mass concerning for renal cell carcinoma follow-up with urology as outpatient  OSA continue CPAP nightly  Type 2 diabetes mellitus: A1c 7.8 blood sugar stable on SSI Blood sugars are persistently more than 150.  Patient is on insulin, glipizide at home.  Currently remains on sliding scale insulin. Increase Lantus 15 units daily.  Will continue to uptitrate once patient is able to eat.   Physical deconditioning continue PT OT, recommended home after clinically stable.   Goals of care palliative care following currently DNR.  Desires all clinical treatment.    DVT prophylaxis: SCD's Start: 08/03/22 1323 apixaban (ELIQUIS) tablet 5  mg   Code Status: DNR Family Communication: None at the bedside Disposition Plan: Status is: Inpatient Remains inpatient appropriate because:  Immediate postop     Consultants:  Palliative care General surgery Cardiology  Procedures:  Laparoscopic colectomy 2/2  Antimicrobials:  Perioperative   Subjective: Seen in the morning rounds.  He tells me it hurts to move but no problem at rest.  Passing flatus.  Tolerating clears.  Objective: Vitals:   08/05/22 0300 08/05/22 0400 08/05/22 0617 08/05/22 0817  BP: (!) 148/70   (!) 157/78  Pulse: 71   85  Resp: 20   17  Temp: 98.6 F (37 C)   99.1 F (37.3 C)  TempSrc: Oral   Oral  SpO2: 91% 94%  92%  Weight:   112.1 kg   Height:        Intake/Output Summary (Last 24 hours) at 08/05/2022 0943 Last data filed at 08/05/2022 0830 Gross per 24 hour  Intake 2165.46 ml  Output 550 ml  Net 1615.46 ml   Filed Weights   08/03/22 0611 08/04/22 0543 08/05/22 0617  Weight: 110.5 kg 112.8 kg 112.1 kg    Examination:  General exam: Appears calm and comfortable.  Laying in bed.  Looks comfortable.  On room air. Respiratory system: Clear to auscultation. Respiratory effort normal.  No added sounds. Cardiovascular system: S1 & S2 heard, RRR.  No pedal edema. Gastrointestinal system: Distended.  Soft.  Mildly tender.  Ports are clean and dry.  Bowel sounds present. Central nervous system: Alert and oriented. No focal neurological deficits. Extremities: Symmetric 5 x 5 power. Skin: No rashes, lesions or ulcers Psychiatry: Judgement and insight appear normal. Mood & affect appropriate.     Data Reviewed: I have personally reviewed following labs and imaging studies  CBC: Recent Labs  Lab 07/30/22 1655 07/31/22 0336 08/01/22 0041 08/02/22 0039 08/03/22 0044 08/04/22 0019 08/05/22 0030  WBC 6.8   < > 7.7 8.0 6.2 13.1* 10.0  NEUTROABS 5.2  --   --  6.1  --   --  8.2*  HGB 8.7*   < > 9.5* 8.1* 8.0* 9.2* 8.1*  HCT 29.6*   < > 31.0* 26.9* 26.2* 30.2* 27.4*  MCV 84.6   < > 81.8 82.8 81.6 82.1 84.0  PLT 205   < > 198 182 170 188 194   < > = values in this interval not  displayed.   Basic Metabolic Panel: Recent Labs  Lab 07/31/22 0336 08/01/22 0041 08/02/22 0039 08/03/22 0044 08/04/22 0019 08/05/22 0030  NA 140 141 136 138 138 139  K 4.0 4.1 3.9 4.0 3.9 3.7  CL 106 109 107 110 109 112*  CO2 '24 22 23 '$ 21* 18* 21*  GLUCOSE 274* 186* 183* 196* 242* 164*  BUN '22 17 13 12 13 16  '$ CREATININE 1.14 1.16 1.16 1.23 1.26* 1.25*  CALCIUM 8.7* 9.0 8.2* 8.3* 8.4* 8.3*  MG 2.1 1.9 1.7 1.9  --  1.9  PHOS 3.2  --   --   --   --   --    GFR: Estimated Creatinine Clearance: 54 mL/min (A) (by C-G formula based on SCr of 1.25 mg/dL (H)). Liver Function Tests: Recent Labs  Lab 07/31/22 0336 08/01/22 0041 08/05/22 0030  AST 23 24 13*  ALT '16 18 13  '$ ALKPHOS 75 78 67  BILITOT 0.4 0.9 0.8  PROT 5.8* 6.1* 5.7*  ALBUMIN 3.1* 3.3* 2.6*   No results for input(s): "LIPASE", "AMYLASE" in the last  168 hours. No results for input(s): "AMMONIA" in the last 168 hours. Coagulation Profile: Recent Labs  Lab 07/31/22 0130  INR 1.2   Cardiac Enzymes: No results for input(s): "CKTOTAL", "CKMB", "CKMBINDEX", "TROPONINI" in the last 168 hours. BNP (last 3 results) No results for input(s): "PROBNP" in the last 8760 hours. HbA1C: No results for input(s): "HGBA1C" in the last 72 hours. CBG: Recent Labs  Lab 08/04/22 1551 08/04/22 1933 08/04/22 2315 08/05/22 0345 08/05/22 0813  GLUCAP 248* 203* 167* 157* 214*   Lipid Profile: No results for input(s): "CHOL", "HDL", "LDLCALC", "TRIG", "CHOLHDL", "LDLDIRECT" in the last 72 hours. Thyroid Function Tests: No results for input(s): "TSH", "T4TOTAL", "FREET4", "T3FREE", "THYROIDAB" in the last 72 hours. Anemia Panel: No results for input(s): "VITAMINB12", "FOLATE", "FERRITIN", "TIBC", "IRON", "RETICCTPCT" in the last 72 hours. Sepsis Labs: Recent Labs  Lab 07/31/22 0130 07/31/22 0336  LATICACIDVEN 1.4 1.2    Recent Results (from the past 240 hour(s))  MRSA Next Gen by PCR, Nasal     Status: None   Collection  Time: 07/31/22  4:30 PM   Specimen: Nasal Mucosa; Nasal Swab  Result Value Ref Range Status   MRSA by PCR Next Gen NOT DETECTED NOT DETECTED Final    Comment: (NOTE) The GeneXpert MRSA Assay (FDA approved for NASAL specimens only), is one component of a comprehensive MRSA colonization surveillance program. It is not intended to diagnose MRSA infection nor to guide or monitor treatment for MRSA infections. Test performance is not FDA approved in patients less than 10 years old. Performed at [facility], [place] [address] [place], [place] [address]          Radiology Studies: No results found.      Scheduled Meds:  acetaminophen  1,000 mg Oral Q6H   apixaban  5 mg Oral BID   Chlorhexidine Gluconate Cloth  6 each Topical Q0600   feeding supplement (GLUCERNA SHAKE)  237 mL Oral BID BM   insulin aspart  0-15 Units Subcutaneous Q4H   insulin detemir  10 Units Subcutaneous Daily   isosorbide mononitrate  30 mg Oral Daily   metoprolol tartrate  12.5 mg Oral BID   pantoprazole  40 mg Oral Q0600   rosuvastatin  40 mg Oral Daily   Continuous Infusions:  sodium chloride Stopped (08/05/22 0939)   ferric gluconate (FERRLECIT) IVPB 250 mg (08/04/22 0933)     LOS: 5 days    Time spent: 35 minutes    Barb Merino, MD [facility] Pager [phone]

## 2022-08-05 NOTE — Progress Notes (Signed)
   Palliative Medicine Inpatient Follow Up Note HPI: 89 y.o. male   admitted on 07/30/2022 with severe CAD, 3 vessels, remote PCI, hypertension, probable right kidney RCC on surveillance, gallstones, A-fib on Eliquis, sleep apnea admitted with symptomatic heme positive IDA. Remote colon polyps and newly identified fungating mass. NSTEMI presentation felt secondary to demand in the setting of profound anemia. Patient and his family face treatment option decisions,  advanced directive decisions and anticipatory care needs.  Today's Discussion 08/05/2022  *Please note that this is a verbal dictation therefore any spelling or grammatical errors are due to the "[employer]" system interpretation.  Chart reviewed inclusive of vital signs, progress notes, laboratory results, and diagnostic images.  Hgb 8.1/ Hct 27.4. PT recs. HH.   I spoke to patients RN, Joellen Jersey this morning. She shares that he has been doing well, consuming his CLD and mobilizing.   I spoke to patient this morning. He is resting comfortably in bed. He states that "he has about had it" with the CLD. I shared the hope of the surgery team advancing him this morning once they see him. He shares that he "wishes his bowels would go on and catch up." Wallie denies pain, N/V, dyspnea at this time. We discussed his present health state and the continued hope for improvement.   Questions and concerns addressed/Palliative Support Provided.   Objective Assessment: Vital Signs Vitals:   08/05/22 0400 08/05/22 0817  BP:  (!) 157/78  Pulse:  85  Resp:  17  Temp:  99.1 F (37.3 C)  SpO2: 94% 92%    Intake/Output Summary (Last 24 hours) at 08/05/2022 1209 Last data filed at 08/05/2022 0830 Gross per 24 hour  Intake 1928.46 ml  Output 550 ml  Net 1378.46 ml    Last Weight  Most recent update: 08/05/2022  6:18 AM    Weight  112.1 kg (247 lb 2.2 oz)            Gen: Elderly caucasian M in NAD HEENT: moist mucous membranes CV: Regular  rate and irregular rhythm  PULM: On RA, breathing is even and nonlabored ABD: soft/nontender EXT: No edema Neuro: Alert and oriented x3 - hard of hearing  SUMMARY OF RECOMMENDATIONS   DNAR/DNI  Chaplain consult to update ADs  Post Op day 2 --> Continues to do well.   Anemia management per primary  Plan to mobilize  Ongoing PMT support  Billing based on MDM: Moderate ______________________________________________________________________________________ [place] Team Team Cell Phone: [phone] Please utilize secure chat with additional questions, if there is no response within 30 minutes please call the above phone number  Palliative Medicine Team providers are available by phone from 7am to 7pm daily and can be reached through the team cell phone.  Should this patient require assistance outside of these hours, please call the patient's attending physician.

## 2022-08-06 DIAGNOSIS — C182 Malignant neoplasm of ascending colon: Principal | ICD-10-CM

## 2022-08-06 DIAGNOSIS — R531 Weakness: Secondary | ICD-10-CM | POA: Diagnosis not present

## 2022-08-06 DIAGNOSIS — I214 Non-ST elevation (NSTEMI) myocardial infarction: Secondary | ICD-10-CM | POA: Diagnosis not present

## 2022-08-06 LAB — BASIC METABOLIC PANEL
Anion gap: 7 (ref 5–15)
BUN: 18 mg/dL (ref 8–23)
CO2: 21 mmol/L — ABNORMAL LOW (ref 22–32)
Calcium: 8 mg/dL — ABNORMAL LOW (ref 8.9–10.3)
Chloride: 110 mmol/L (ref 98–111)
Creatinine, Ser: 1.41 mg/dL — ABNORMAL HIGH (ref 0.61–1.24)
GFR, Estimated: 49 mL/min — ABNORMAL LOW (ref >60)
Glucose, Bld: 243 mg/dL — ABNORMAL HIGH (ref 70–99)
Potassium: 3.6 mmol/L (ref 3.5–5.1)
Sodium: 138 mmol/L (ref 135–145)

## 2022-08-06 LAB — CBC WITH DIFFERENTIAL/PLATELET
Abs Immature Granulocytes: 0.11 10*3/uL — ABNORMAL HIGH (ref 0.00–0.07)
Basophils Absolute: 0 10*3/uL (ref 0.0–0.1)
Basophils Relative: 0 %
Eosinophils Absolute: 0.3 10*3/uL (ref 0.0–0.5)
Eosinophils Relative: 3 %
HCT: 25.6 % — ABNORMAL LOW (ref 39.0–52.0)
Hemoglobin: 8 g/dL — ABNORMAL LOW (ref 13.0–17.0)
Immature Granulocytes: 1 %
Lymphocytes Relative: 8 %
Lymphs Abs: 0.7 10*3/uL (ref 0.7–4.0)
MCH: 25.7 pg — ABNORMAL LOW (ref 26.0–34.0)
MCHC: 31.3 g/dL (ref 30.0–36.0)
MCV: 82.3 fL (ref 80.0–100.0)
Monocytes Absolute: 0.6 10*3/uL (ref 0.1–1.0)
Monocytes Relative: 7 %
Neutro Abs: 6.3 10*3/uL (ref 1.7–7.7)
Neutrophils Relative %: 81 %
Platelets: 175 10*3/uL (ref 150–400)
RBC: 3.11 MIL/uL — ABNORMAL LOW (ref 4.22–5.81)
RDW: 17.2 % — ABNORMAL HIGH (ref 11.5–15.5)
WBC: 7.9 10*3/uL (ref 4.0–10.5)
nRBC: 0 % (ref 0.0–0.2)

## 2022-08-06 LAB — GLUCOSE, CAPILLARY
Glucose-Capillary: 185 mg/dL — ABNORMAL HIGH (ref 70–99)
Glucose-Capillary: 304 mg/dL — ABNORMAL HIGH (ref 70–99)
Glucose-Capillary: 289 mg/dL — ABNORMAL HIGH (ref 70–99)
Glucose-Capillary: 271 mg/dL — ABNORMAL HIGH (ref 70–99)
Glucose-Capillary: 242 mg/dL — ABNORMAL HIGH (ref 70–99)

## 2022-08-06 MED ORDER — DOCUSATE SODIUM 100 MG PO CAPS
100.0000 mg | ORAL_CAPSULE | Freq: Two times a day (BID) | ORAL | Status: DC
  Administered 2022-08-06 – 2022-08-11 (×10): 100 mg via ORAL
  Filled 2022-08-06 (×12): qty 1

## 2022-08-06 MED ORDER — INSULIN DETEMIR 100 UNIT/ML ~~LOC~~ SOLN
16.0000 [IU] | Freq: Every day | SUBCUTANEOUS | Status: DC
  Administered 2022-08-07: 16 [IU] via SUBCUTANEOUS
  Filled 2022-08-06: qty 0.16

## 2022-08-06 MED ORDER — OXYCODONE HCL 5 MG PO TABS
5.0000 mg | ORAL_TABLET | ORAL | Status: DC | PRN
  Administered 2022-08-07 – 2022-08-08 (×2): 5 mg via ORAL
  Filled 2022-08-06 (×2): qty 1

## 2022-08-06 NOTE — Progress Notes (Signed)
PROGRESS NOTE    Ryan Garcia  DUK:[id number] DOB: 23-Nov-1933 DOA: 07/30/2022 PCP: Abner Greenspan, MD    Brief Narrative:  89 y.o. male with medical history significant for triple-vessel coronary artery disease status post PCI with stent placement, paroxysmal A-fib on Eliquis, chronic diastolic CHF, hypertension, hyperlipidemia, OSA on CPAP, right renal mass diagnosed 2 years ago, followed by urology presented with exertional chest pain.  On presentation, EKG did not show any evidence of acute ischemia but troponins were 93 and subsequently 425. Hemoglobin was 8.7 (from baseline of 13.1).  Eliquis was held.  He was started on IV Protonix. CT chest abdomen and pelvis with contrast showed no evidence of active bleeding however showed 2.5 x 2.6 cm enhancing lesion in the anterior right upper kidney grossly unchanged from prior MRI compatible with solid renal neoplasm such as renal cell carcinoma.  Cardiology and GI were consulted.  Palliative care was also consulted for goals of care discussion.   1/31: Underwent EGD that was normal biopsy obtained,Colonoscopy showed Malignant tumor in the mid descending colon biopsied, had polyps in transverse colon and sigmoid colon,CEA  and surgery consult obtained.  Patient had CT chest abdomen pelvis showing renal lesion but without suspicion for metastatic disease.  Palliative care consulted> patient interested to pursue surgical option.  Patient underwent laparoscopic colectomy 2/2.  Also followed by cardiology for non-STEMI.     Assessment & Plan:   Malignant right colonic mass: S/p egs/ colo 1/31:EGD  normal ,Colonoscopy: Malignant tumor in the mid descending colon biopsied, had polyps in transverse colon and sigmoid colon,.CT chest abdomen pelvis showing renal lesion but without suspicion for metastatic disease.  CEA 6.1. S/p OR lap assisted rt colectomy 08/03/22. Cont diet, pain control per CCS. surgical biopsy pending. Advancing to regular diet.  Bowel  functions returning.  No BM yet.  Followed by surgery.  Okay to for Eliquis.   NSTEMI type II/demand ischemia: Cardiology was following.  troponin 93 peaked to 651> 609 flat trend EKG without significant ischemia, chest pain on admission.  Per cardiology likely demand ischemia in the setting of anemia with known CAD on  prior cath severe diffuse LAD and LCx disease.  Echo with EF 60 to 65%, no RWMA, due to anemia not pursuing invasive workup,.  Continue on metoprolol, statin and Imdur.   Iron deficiency anemia: Likely due to #1.  Receiving iron transfusions.  Will continue close monitoring.  Eliquis resumed.  Monitor for bleeding.    Chronic diastolic BJS:[id number] monitor intake output Daily weight continue Imdur and metoprolol.wt stable/improving as below   Acute kidney injury likely from dehydration baseline 1.1.  Fluctuating renal functions likely due to surgical distress.  Slightly worsening creatinine today.  Will continue IV fluids and recheck tomorrow.  Paroxysmal atrial fibrillation: Eliquis restarted 2/4.  Rate controlled on metoprolol.  Hypertension: BP stable, continue Imdur and metoprolol.  HLD continue statin  Right renal mass concerning for renal cell carcinoma follow-up with urology as outpatient  OSA continue CPAP nightly  Type 2 diabetes mellitus: A1c 7.8 blood sugar stable on SSI Blood sugars are persistently more than 150.  Patient is on insulin, glipizide at home.  Currently remains on sliding scale insulin. Increase Lantus 16 units daily.  Will continue to uptitrate once patient is able to eat.   Physical deconditioning continue PT OT, recommended home after clinically stable.   Goals of care palliative care following currently DNR.  Desires all clinical treatment.    DVT prophylaxis: SCD's  Start: 08/03/22 1323 apixaban (ELIQUIS) tablet 5 mg   Code Status: DNR Family Communication: Wife at the bedside Disposition Plan: Status is: Inpatient Remains  inpatient appropriate because: Wait for diet tolerance and bowel function.  Monitor for bleeding.     Consultants:  Palliative care General surgery Cardiology  Procedures:  Laparoscopic colectomy 2/2  Antimicrobials:  Perioperative   Subjective: Patient was seen and examined.  Wife at the bedside.  Soreness on walking.  He ate a lot liquid diet last night but no bowel movement yet.  Passing flatus.  Plan to mobilize today.  Once he has a bowel movement and no evidence of bleeding, anticipate home tomorrow.  Objective: Vitals:   08/05/22 2335 08/06/22 0345 08/06/22 0352 08/06/22 0957  BP: 137/64 (!) 139/54  (!) 135/55  Pulse: 78 65  82  Resp: (!) 22 19    Temp: 98.7 F (37.1 C) 98.8 F (37.1 C)    TempSrc: Oral Oral    SpO2: 98% 96%    Weight:   113.1 kg   Height:        Intake/Output Summary (Last 24 hours) at 08/06/2022 1010 Last data filed at 08/06/2022 0805 Gross per 24 hour  Intake 837 ml  Output 1225 ml  Net -388 ml   Filed Weights   08/04/22 0543 08/05/22 0617 08/06/22 0352  Weight: 112.8 kg 112.1 kg 113.1 kg    Examination:  General exam: Appears calm and comfortable.  Sitting in couch.  Interactive. Respiratory system: Clear to auscultation. Respiratory effort normal.  No added sounds. Cardiovascular system: S1 & S2 heard, RRR.  No pedal edema. Gastrointestinal system: Distended.  Soft.  Mildly tender.  Ports are clean and dry.  Bowel sounds present. Central nervous system: Alert and oriented. No focal neurological deficits. Extremities: Symmetric 5 x 5 power. Skin: No rashes, lesions or ulcers Psychiatry: Judgement and insight appear normal. Mood & affect appropriate.     Data Reviewed: I have personally reviewed following labs and imaging studies  CBC: Recent Labs  Lab 07/30/22 1655 07/31/22 0336 08/02/22 0039 08/03/22 0044 08/04/22 0019 08/05/22 0030 08/06/22 0026  WBC 6.8   < > 8.0 6.2 13.1* 10.0 7.9  NEUTROABS 5.2  --  6.1  --   --   8.2* 6.3  HGB 8.7*   < > 8.1* 8.0* 9.2* 8.1* 8.0*  HCT 29.6*   < > 26.9* 26.2* 30.2* 27.4* 25.6*  MCV 84.6   < > 82.8 81.6 82.1 84.0 82.3  PLT 205   < > 182 170 188 194 175   < > = values in this interval not displayed.   Basic Metabolic Panel: Recent Labs  Lab 07/31/22 0336 08/01/22 0041 08/02/22 0039 08/03/22 0044 08/04/22 0019 08/05/22 0030 08/06/22 0026  NA 140 141 136 138 138 139 138  K 4.0 4.1 3.9 4.0 3.9 3.7 3.6  CL 106 109 107 110 109 112* 110  CO2 '24 22 23 '$ 21* 18* 21* 21*  GLUCOSE 274* 186* 183* 196* 242* 164* 243*  BUN '22 17 13 12 13 16 18  '$ CREATININE 1.14 1.16 1.16 1.23 1.26* 1.25* 1.41*  CALCIUM 8.7* 9.0 8.2* 8.3* 8.4* 8.3* 8.0*  MG 2.1 1.9 1.7 1.9  --  1.9  --   PHOS 3.2  --   --   --   --   --   --    GFR: Estimated Creatinine Clearance: 48.1 mL/min (A) (by C-G formula based on SCr of 1.41 mg/dL (H)). Liver  Function Tests: Recent Labs  Lab 07/31/22 0336 08/01/22 0041 08/05/22 0030  AST 23 24 13*  ALT '16 18 13  '$ ALKPHOS 75 78 67  BILITOT 0.4 0.9 0.8  PROT 5.8* 6.1* 5.7*  ALBUMIN 3.1* 3.3* 2.6*   No results for input(s): "LIPASE", "AMYLASE" in the last 168 hours. No results for input(s): "AMMONIA" in the last 168 hours. Coagulation Profile: Recent Labs  Lab 07/31/22 0130  INR 1.2   Cardiac Enzymes: No results for input(s): "CKTOTAL", "CKMB", "CKMBINDEX", "TROPONINI" in the last 168 hours. BNP (last 3 results) No results for input(s): "PROBNP" in the last 8760 hours. HbA1C: No results for input(s): "HGBA1C" in the last 72 hours. CBG: Recent Labs  Lab 08/05/22 0813 08/05/22 1217 08/05/22 1559 08/05/22 2114 08/06/22 0610  GLUCAP 214* 190* 196* 268* 185*   Lipid Profile: No results for input(s): "CHOL", "HDL", "LDLCALC", "TRIG", "CHOLHDL", "LDLDIRECT" in the last 72 hours. Thyroid Function Tests: No results for input(s): "TSH", "T4TOTAL", "FREET4", "T3FREE", "THYROIDAB" in the last 72 hours. Anemia Panel: No results for input(s):  "VITAMINB12", "FOLATE", "FERRITIN", "TIBC", "IRON", "RETICCTPCT" in the last 72 hours. Sepsis Labs: Recent Labs  Lab 07/31/22 0130 07/31/22 0336  LATICACIDVEN 1.4 1.2    Recent Results (from the past 240 hour(s))  MRSA Next Gen by PCR, Nasal     Status: None   Collection Time: 07/31/22  4:30 PM   Specimen: Nasal Mucosa; Nasal Swab  Result Value Ref Range Status   MRSA by PCR Next Gen NOT DETECTED NOT DETECTED Final    Comment: (NOTE) The GeneXpert MRSA Assay (FDA approved for NASAL specimens only), is one component of a comprehensive MRSA colonization surveillance program. It is not intended to diagnose MRSA infection nor to guide or monitor treatment for MRSA infections. Test performance is not FDA approved in patients less than 57 years old. Performed at [facility], [place] [address], [place], [place] [address]          Radiology Studies: No results found.      Scheduled Meds:  acetaminophen  1,000 mg Oral Q6H   apixaban  5 mg Oral BID   docusate sodium  100 mg Oral BID   feeding supplement (GLUCERNA SHAKE)  237 mL Oral BID BM   insulin aspart  0-15 Units Subcutaneous TID AC & HS   [START ON 08/07/2022] insulin detemir  16 Units Subcutaneous Daily   isosorbide mononitrate  30 mg Oral Daily   metoprolol tartrate  12.5 mg Oral BID   pantoprazole  40 mg Oral Q0600   rosuvastatin  40 mg Oral Daily   Continuous Infusions:  sodium chloride Stopped (08/05/22 0939)   ferric gluconate (FERRLECIT) IVPB 250 mg (08/06/22 0956)     LOS: 6 days    Time spent: 35 minutes    Barb Merino, MD [facility] Pager [phone]

## 2022-08-06 NOTE — Care Management Important Message (Signed)
Important Message  Patient Details  Name: Ryan Garcia MRN: [id number] Date of Birth: 1933/09/18   Medicare Important Message Given:  Yes     [name] 08/06/2022, 2:20 PM

## 2022-08-06 NOTE — Progress Notes (Signed)
Mobility Specialist Progress Note:   08/06/22 1605  Mobility  Activity Ambulated with assistance in hallway  Level of Assistance Contact guard assist, steadying assist  Assistive Device  (Iv pole)  Distance Ambulated (ft) 300 ft  Activity Response Tolerated well  $Mobility charge 1 Mobility   Pt in bed willing to participate in mobility. No complaints of pain. MinA for bed mobility. Left in bed with call bell in reach and all needs met.   Gareth Eagle Liboria Putnam Mobility Specialist Please contact via [employer] or  Rehab Office at [phone]

## 2022-08-06 NOTE — Progress Notes (Signed)
Pt is wearing a home CPAP on home settings.

## 2022-08-06 NOTE — Progress Notes (Signed)
[facility] Progress Note  3 Days Post-Op  Subjective: CC-  Wife at bedside. Patient states that his abdomen is sore when he moves around, especially when he is getting OOB. Otherwise minimal abdominal pain. Only taking tylenol. Denies n/v. Tolerating carb mod diet. Passing a lot of flatus, no BM since surgery.   Objective: Vital signs in last 24 hours: Temp:  [98.1 F (36.7 C)-98.8 F (37.1 C)] 98.8 F (37.1 C) (02/05 0345) Pulse Rate:  [62-78] 65 (02/05 0345) Resp:  [16-22] 19 (02/05 0345) BP: (119-139)/(54-64) 139/54 (02/05 0345) SpO2:  [96 %-98 %] 96 % (02/05 0345) Weight:  [113.1 kg] 113.1 kg (02/05 0352) Last BM Date : 07/31/22  Intake/Output from previous day: 02/04 0701 - 02/05 0700 In: 1077 [P.O.:1077] Out: 1025 [Urine:1025] Intake/Output this shift: Total I/O In: 240 [P.O.:240] Out: 200 [Urine:200]  PE: Gen:  Alert, NAD Abd: soft, protuberant, bowel sounds present, midline incision cdi with staples present and no erythema or drainage, port site also cdi with staple present  Lab Results:  Recent Labs    08/05/22 0030 08/06/22 0026  WBC 10.0 7.9  HGB 8.1* 8.0*  HCT 27.4* 25.6*  PLT 194 175   BMET Recent Labs    08/05/22 0030 08/06/22 0026  NA 139 138  K 3.7 3.6  CL 112* 110  CO2 21* 21*  GLUCOSE 164* 243*  BUN 16 18  CREATININE 1.25* 1.41*  CALCIUM 8.3* 8.0*   PT/INR No results for input(s): "LABPROT", "INR" in the last 72 hours. CMP     Component Value Date/Time   NA 138 08/06/2022 0026   NA 144 12/06/2021 1344   K 3.6 08/06/2022 0026   CL 110 08/06/2022 0026   CO2 21 (L) 08/06/2022 0026   GLUCOSE 243 (H) 08/06/2022 0026   BUN 18 08/06/2022 0026   BUN 22 12/06/2021 1344   CREATININE 1.41 (H) 08/06/2022 0026   CALCIUM 8.0 (L) 08/06/2022 0026   PROT 5.7 (L) 08/05/2022 0030   PROT 6.5 09/15/2019 0810   ALBUMIN 2.6 (L) 08/05/2022 0030   ALBUMIN 4.1 09/15/2019 0810   AST 13 (L) 08/05/2022 0030   ALT 13 08/05/2022 0030    ALKPHOS 67 08/05/2022 0030   BILITOT 0.8 08/05/2022 0030   BILITOT 0.3 09/15/2019 0810   GFRNONAA 49 (L) 08/06/2022 0026   GFRAA >60 07/03/2018 0304   Lipase     Component Value Date/Time   LIPASE 28 04/16/2020 0111       Studies/Results: No results found.  Anti-infectives: Anti-infectives (From admission, onward)    Start     Dose/Rate Route Frequency Ordered Stop   08/03/22 0600  cefoTEtan (CEFOTAN) 2 g in sodium chloride 0.9 % 100 mL IVPB        2 g 200 mL/hr over 30 Minutes Intravenous On call to O.R. 08/02/22 1527 08/03/22 1030        Assessment/Plan POD 3, s/p lap assisted right colectomy by Dr. Marlou Starks 08/03/22 for Ascending colon mass concerning for malignancy - c-scope path with adenoca as expected, surgical path pending - CEA 6.1. CT chest abdomen pelvis done without findings of metastatic disease.  - Tolerating CM diet and passing flatus, no BM. Add BID colace today -mobilization, pulm toilet, IS. Continue therapies - recommending home health PT   FEN: CM diet, glucerna ID: none VTE: eliquis   --Per primary-- NSTEMI - present on admission, likely secondary to demand ischemia. Cardiology following CAD s/p PCI afib on eliquis CHF HTN HLD  OSA right renal mass DNR/DNI    LOS: 6 days    Wellington Hampshire, [facility] 08/06/2022, 8:45 AM Please see Amion for pager number during day hours 7:00am-4:30pm

## 2022-08-06 NOTE — Progress Notes (Addendum)
This chaplain responded to PMT NP-Michelle consult for updating the Pt. Advance Directive. The Pt. Is sleeping at the time of the visit. This chaplain will attempt a revisit.  **1350 The Pt. is awake after eating lunch. The Pt. decided to keep his wife-Marie as his surrogate decision maker. The chaplain understands any changes to his Advance Directive will be made after discharge.   Chaplain Sallyanne Kuster [phone]

## 2022-08-06 NOTE — Progress Notes (Signed)
Physical Therapy Treatment Patient Details Name: Ryan Garcia MRN: [id number] DOB: [birth year] Today's Date: 08/06/2022   History of Present Illness Pt is an 89 y.o. male who presented 07/30/22 with exertional chest pain and found to have elevated troponin and anemia. Pt admitted with NSTEMI and found ascending colon mass concerning for malignancy s/p endoscopy and colonoscopy 1/31. R colectomy on 2/2. PMH: arthritis, CAD, CKD, DM, GERD, HLD, hx of skin cancer, HTN, NSTEMI, paroxysmal atrial flutter, renal mass, scoliosis    PT Comments    Pt reporting min abdominal discomfort during mobility this date, but mobilizing well with min cues. Pt ambulatory x60 ft without UE support with no overt unsteadiness noted, but pt feels more comfortable with single UE support. Pt states he has friends/neighbors who he will borrow equipment from as needed at d/c. Pt progressing well, will continue to follow.   HR elevation to 127 bpm during gait, asymptomatic and quickly recovers to low 100s at rest    Recommendations for follow up therapy are one component of a multi-disciplinary discharge planning process, led by the attending physician.  Recommendations may be updated based on patient status, additional functional criteria and insurance authorization.  Follow Up Recommendations  Home health PT     Assistance Recommended at Discharge Intermittent Supervision/Assistance  Patient can return home with the following Assistance with cooking/housework;Assist for transportation;Help with stairs or ramp for entrance;A little help with walking and/or transfers;A little help with bathing/dressing/bathroom   Equipment Recommendations  None recommended by PT (pt and family state they have neighbors/friends they can borrow from as needed)    Recommendations for Other Services       Precautions / Restrictions Precautions Precautions: Fall;Other (comment) Precaution Comments: monitor HR (HRmax observed 127  bpm) Restrictions Weight Bearing Restrictions: No     Mobility  Bed Mobility Overal bed mobility: Needs Assistance Bed Mobility: Rolling, Sit to Sidelying Rolling: Supervision       Sit to sidelying: Min assist General bed mobility comments: up in chair upon PT arrival to room, light assist for lifting LEs into bed and trunk placement upon return to supine.    Transfers Overall transfer level: Needs assistance Equipment used: None Transfers: Sit to/from Stand Sit to Stand: Min guard                Ambulation/Gait Ambulation/Gait assistance: Min guard Gait Distance (Feet): 300 Feet Assistive device: IV Pole, None Gait Pattern/deviations: Step-through pattern, Decreased stride length, Trunk flexed Gait velocity: decr     General Gait Details: cues for upright posture, pt using IV pole for most of gait to self-steady, x60 ft without UE support with no overt unsteadiness noted but pt feels more comfortable with single UE support.   Stairs             Wheelchair Mobility    Modified Rankin (Stroke Patients Only)       Balance Overall balance assessment: Mild deficits observed, not formally tested                                          Cognition Arousal/Alertness: Awake/alert Behavior During Therapy: WFL for tasks assessed/performed, Flat affect Overall Cognitive Status: Within Functional Limits for tasks assessed  Exercises      General Comments General comments (skin integrity, edema, etc.): wife present. HR elevated to 127 bpm during gait, pt with no reports of chest discomfort and tolerated mobility well.      Pertinent Vitals/Pain Pain Assessment Pain Assessment: Faces Faces Pain Scale: Hurts little more Pain Location: abdomen Pain Descriptors / Indicators: Sore Pain Intervention(s): Limited activity within patient's tolerance, Monitored during session,  Repositioned    Home Living                          Prior Function            PT Goals (current goals can now be found in the care plan section) Acute Rehab PT Goals Patient Stated Goal: to go home soon PT Goal Formulation: With patient/family Time For Goal Achievement: 08/16/22 Potential to Achieve Goals: Good Progress towards PT goals: Progressing toward goals    Frequency    Min 3X/week      PT Plan Current plan remains appropriate    Co-evaluation              AM-PAC PT "6 Clicks" Mobility   Outcome Measure  Help needed turning from your back to your side while in a flat bed without using bedrails?: A Little Help needed moving from lying on your back to sitting on the side of a flat bed without using bedrails?: A Little Help needed moving to and from a bed to a chair (including a wheelchair)?: A Little Help needed standing up from a chair using your arms (e.g., wheelchair or bedside chair)?: A Little Help needed to walk in hospital room?: A Little Help needed climbing 3-5 steps with a railing? : A Little 6 Click Score: 18    End of Session   Activity Tolerance: Patient tolerated treatment well Patient left: with call bell/phone within reach;with family/visitor present;in bed;with bed alarm set Nurse Communication: Mobility status PT Visit Diagnosis: Unsteadiness on feet (R26.81);Other abnormalities of gait and mobility (R26.89);Muscle weakness (generalized) (M62.81)     Time: 1000-1018 PT Time Calculation (min) (ACUTE ONLY): 18 min  Charges:  $Therapeutic Activity: 8-22 mins                    Stacie Glaze, PT DPT Acute Rehabilitation Services Pager [phone]  Office [phone]    Roxine Caddy E Ruffin Pyo 08/06/2022, 10:52 AM

## 2022-08-06 NOTE — Progress Notes (Signed)
Occupational Therapy Treatment Patient Details Name: Ryan Garcia MRN: [id number] DOB: [birth year] Today's Date: 08/06/2022   History of present illness Pt is an 89 y.o. male who presented 07/30/22 with exertional chest pain and found to have elevated troponin and anemia. Pt admitted with NSTEMI and found ascending colon mass concerning for malignancy s/p endoscopy and colonoscopy 1/31. R colectomy on 2/2. PMH: arthritis, CAD, CKD, DM, GERD, HLD, hx of skin cancer, HTN, NSTEMI, paroxysmal atrial flutter, renal mass, scoliosis   OT comments  Pt progressing well towards OT goals with improving balance noted. Focus on bathroom mobility, simulated tub transfers and standing ADLs at sink. Pt and wife actively involved in fall prevention discussion with pt demonstrating good safety awareness. Recommend use of toilet riser over toilet at home to increase ease of transfers.   Recommendations for follow up therapy are one component of a multi-disciplinary discharge planning process, led by the attending physician.  Recommendations may be updated based on patient status, additional functional criteria and insurance authorization.    Follow Up Recommendations  No OT follow up     Assistance Recommended at Discharge PRN  Patient can return home with the following  A little help with bathing/dressing/bathroom;Assistance with cooking/housework;Help with stairs or ramp for entrance   Equipment Recommendations  None recommended by OT    Recommendations for Other Services      Precautions / Restrictions Precautions Precautions: Fall;Other (comment) Precaution Comments: monitor HR Restrictions Weight Bearing Restrictions: No       Mobility Bed Mobility Overal bed mobility: Needs Assistance Bed Mobility: Rolling, Sidelying to Sit Rolling: Supervision Sidelying to sit: Min guard       General bed mobility comments: min guard for trunk lifting, use of bedrails. pt able to complete with bed flat  today    Transfers Overall transfer level: Needs assistance Equipment used: None Transfers: Sit to/from Stand Sit to Stand: Min guard                 Balance Overall balance assessment: Mild deficits observed, not formally tested                                         ADL either performed or assessed with clinical judgement   ADL Overall ADL's : Needs assistance/impaired     Grooming: Oral care;Standing;Supervision/safety                   Toilet Transfer: Min guard;Ambulation   Toileting- Clothing Manipulation and Hygiene: Min guard;Sitting/lateral lean;Sit to/from stand   Tub/ Shower Transfer: Min guard;Ambulation;Grab bars Tub/Shower Transfer Details (indicate cue type and reason): simulated with tub height and RW as a grab bar w/ fair carryover. Pt able to verbalize how he sequences the stepping over the tub. Functional mobility during ADLs: Min guard General ADL Comments: Improving balance and pt showing insight into fall prevention at home, verbally discussed how he and his wife take in groceries up the steps to decrease fall risk, invested in chair lift for basement access    Extremity/Trunk Assessment Upper Extremity Assessment Upper Extremity Assessment: Overall WFL for tasks assessed   Lower Extremity Assessment Lower Extremity Assessment: Defer to PT evaluation        Vision   Vision Assessment?: No apparent visual deficits   Perception     Praxis      Cognition Arousal/Alertness: Awake/alert Behavior During Therapy:  WFL for tasks assessed/performed Overall Cognitive Status: Within Functional Limits for tasks assessed                                          Exercises      Shoulder Instructions       General Comments Wife present and supportive    Pertinent Vitals/ Pain       Pain Assessment Pain Assessment: Faces Faces Pain Scale: Hurts a little bit Pain Location: abdomen Pain Descriptors  / Indicators: Sore Pain Intervention(s): Monitored during session  Home Living                                          Prior Functioning/Environment              Frequency  Min 2X/week        Progress Toward Goals  OT Goals(current goals can now be found in the care plan section)  Progress towards OT goals: Progressing toward goals  Acute Rehab OT Goals Patient Stated Goal: home soon OT Goal Formulation: With patient/family Time For Goal Achievement: 08/18/22 Potential to Achieve Goals: Good ADL Goals Pt Will Perform Lower Body Bathing: with modified independence;sit to/from stand Pt Will Perform Lower Body Dressing: with modified independence;sit to/from stand Pt Will Transfer to Toilet: Independently;ambulating Pt Will Perform Tub/Shower Transfer: Tub transfer;with supervision;ambulating;grab bars  Plan Discharge plan remains appropriate    Co-evaluation                 AM-PAC OT "6 Clicks" Daily Activity     Outcome Measure   Help from another person eating meals?: None Help from another person taking care of personal grooming?: A Little Help from another person toileting, which includes using toliet, bedpan, or urinal?: A Little Help from another person bathing (including washing, rinsing, drying)?: A Little Help from another person to put on and taking off regular upper body clothing?: A Little Help from another person to put on and taking off regular lower body clothing?: A Little 6 Click Score: 19    End of Session Equipment Utilized During Treatment: Gait belt  OT Visit Diagnosis: Unsteadiness on feet (R26.81);Other abnormalities of gait and mobility (R26.89)   Activity Tolerance Patient tolerated treatment well   Patient Left in chair;with call bell/phone within reach;with chair alarm set;with family/visitor present   Nurse Communication Mobility status        Time: 3614-4315 OT Time Calculation (min): 33  min  Charges: OT General Charges $OT Visit: 1 Visit OT Treatments $Self Care/Home Management : 23-37 mins  [name], OTR/L Acute Rehab Services Office: [phone]   [name] 08/06/2022, 8:53 AM

## 2022-08-06 NOTE — Progress Notes (Signed)
Patient ID: [name], male   DOB: June 05, 1934, 89 y.o.   MRN: [id number]   Progress Note from the Palliative Medicine Team at [facility]   Patient Name: Ryan Garcia        Date: 08/06/2022 DOB: June 15, 1934  Age: 89 y.o. MRN#: [id number] Attending Physician: Barb Merino, MD Primary Care Physician: Tower, Wynelle Fanny, MD Admit Date: 07/30/2022   Medical records reviewed, discussed with treatment team   89 y.o. male with medical history significant for CAD s/p PCI, afib on eliquis, CHF, HTN, HLD, OSA, right renal mass who presented to [facility] ED via EMS with chest pain. He was also found to have anemia with concern GI bleed with heme positive stool. He was admitted to the medicine service and GI and cardiology consulted.    He was found to have NSTEMI related to demand ischemia in setting of anemia and eliquis held.    Patient underwent colonoscopy and endoscopy by GI with polyps in proximal ascending, transverse (biopsied), and sigmoid (biopsied) colon and rectum and malignant tumor in mid ascending transverse colon (biopsied). Endoscopy with findings of erythema in duodenum which was biopsied.  Biopsy significant for adenocarcinoma  Patient is postop day 3, status post lap with right colectomy.  He is progressing well, tolerating diet and mobility with physical therapy.    This NP visited patient at the bedside  as a follow up for palliative medicine needs and emotional support.    Mr Aker is alert and oriented x 3.  He speaks to "this is harder than I thought".  Education offered on the importance of continued mobility, overall nutrition and  the importance of continued conversation with his family and the medical providers regarding overall plan of care and treatment options,  ensuring decisions are within the context of the patients values and GOCs.  Questions and concerns addressed     25 minutes   [name] NP  Palliative Medicine Team Team Phone # [phone] Pager  [phone]

## 2022-08-06 NOTE — Inpatient Diabetes Management (Signed)
Inpatient Diabetes Program Recommendations  AACE/ADA: New Consensus Statement on Inpatient Glycemic Control (2015)  Target Ranges:  Prepandial:   less than 140 mg/dL      Peak postprandial:   less than 180 mg/dL (1-2 hours)      Critically ill patients:  140 - 180 mg/dL   Lab Results  Component Value Date   GLUCAP 185 (H) 08/06/2022   HGBA1C 7.8 (H) 07/31/2022    Review of Glycemic Control  Latest Reference Range & Units 08/05/22 12:17 08/05/22 15:59 08/05/22 21:14 08/06/22 06:10  Glucose-Capillary 70 - 99 mg/dL 190 (H) 196 (H) 268 (H) 185 (H)  (H): Data is abnormally high Diabetes history: Type 2 DM Outpatient Diabetes medications: Lantus 51 units QHS, Novolog 16 units TID, Metformin 1000 mg BID Current orders for Inpatient glycemic control: Levemir 10 units QHS, Novolog 0-15 units TID & HS  Inpatient Diabetes Program Recommendations:    Consider further increasing Levemir to 16 units QHS.   Thanks, Bronson Curb, MSN, RNC-OB Diabetes Coordinator [phone] (8a-5p)

## 2022-08-07 DIAGNOSIS — I214 Non-ST elevation (NSTEMI) myocardial infarction: Secondary | ICD-10-CM | POA: Diagnosis not present

## 2022-08-07 LAB — BASIC METABOLIC PANEL
Anion gap: 6 (ref 5–15)
BUN: 11 mg/dL (ref 8–23)
CO2: 22 mmol/L (ref 22–32)
Calcium: 8 mg/dL — ABNORMAL LOW (ref 8.9–10.3)
Chloride: 109 mmol/L (ref 98–111)
Creatinine, Ser: 1.26 mg/dL — ABNORMAL HIGH (ref 0.61–1.24)
GFR, Estimated: 56 mL/min — ABNORMAL LOW (ref >60)
Glucose, Bld: 211 mg/dL — ABNORMAL HIGH (ref 70–99)
Potassium: 3.3 mmol/L — ABNORMAL LOW (ref 3.5–5.1)
Sodium: 137 mmol/L (ref 135–145)

## 2022-08-07 LAB — GLUCOSE, CAPILLARY
Glucose-Capillary: 181 mg/dL — ABNORMAL HIGH (ref 70–99)
Glucose-Capillary: 246 mg/dL — ABNORMAL HIGH (ref 70–99)
Glucose-Capillary: 256 mg/dL — ABNORMAL HIGH (ref 70–99)
Glucose-Capillary: 265 mg/dL — ABNORMAL HIGH (ref 70–99)

## 2022-08-07 LAB — CBC WITH DIFFERENTIAL/PLATELET
Abs Immature Granulocytes: 0.17 10*3/uL — ABNORMAL HIGH (ref 0.00–0.07)
Basophils Absolute: 0 10*3/uL (ref 0.0–0.1)
Basophils Relative: 1 %
Eosinophils Absolute: 0.3 10*3/uL (ref 0.0–0.5)
Eosinophils Relative: 3 %
HCT: 25.9 % — ABNORMAL LOW (ref 39.0–52.0)
Hemoglobin: 8.1 g/dL — ABNORMAL LOW (ref 13.0–17.0)
Immature Granulocytes: 2 %
Lymphocytes Relative: 10 %
Lymphs Abs: 0.8 10*3/uL (ref 0.7–4.0)
MCH: 25.6 pg — ABNORMAL LOW (ref 26.0–34.0)
MCHC: 31.3 g/dL (ref 30.0–36.0)
MCV: 81.7 fL (ref 80.0–100.0)
Monocytes Absolute: 0.8 10*3/uL (ref 0.1–1.0)
Monocytes Relative: 10 %
Neutro Abs: 6.2 10*3/uL (ref 1.7–7.7)
Neutrophils Relative %: 74 %
Platelets: 186 10*3/uL (ref 150–400)
RBC: 3.17 MIL/uL — ABNORMAL LOW (ref 4.22–5.81)
RDW: 17.2 % — ABNORMAL HIGH (ref 11.5–15.5)
WBC: 8.2 10*3/uL (ref 4.0–10.5)
nRBC: 0.4 % — ABNORMAL HIGH (ref 0.0–0.2)

## 2022-08-07 MED ORDER — SIMETHICONE 80 MG PO CHEW: 80.0000 mg | CHEWABLE_TABLET | Freq: Four times a day (QID) | ORAL | Status: DC | PRN

## 2022-08-07 MED ORDER — POLYETHYLENE GLYCOL 3350 17 G PO PACK
17.0000 g | PACK | Freq: Every day | ORAL | Status: DC
  Administered 2022-08-07: 17 g via ORAL
  Filled 2022-08-07: qty 1

## 2022-08-07 MED ORDER — INSULIN DETEMIR 100 UNIT/ML ~~LOC~~ SOLN
20.0000 [IU] | Freq: Every day | SUBCUTANEOUS | Status: DC
  Administered 2022-08-08: 20 [IU] via SUBCUTANEOUS
  Filled 2022-08-07: qty 0.2

## 2022-08-07 MED ORDER — HYDRALAZINE HCL 25 MG PO TABS
25.0000 mg | ORAL_TABLET | Freq: Four times a day (QID) | ORAL | Status: DC | PRN
  Administered 2022-08-07: 25 mg via ORAL
  Filled 2022-08-07: qty 1

## 2022-08-07 MED ORDER — POTASSIUM CHLORIDE CRYS ER 20 MEQ PO TBCR
40.0000 meq | EXTENDED_RELEASE_TABLET | Freq: Once | ORAL | Status: AC
  Administered 2022-08-07: 40 meq via ORAL
  Filled 2022-08-07: qty 2

## 2022-08-07 NOTE — Progress Notes (Signed)
This chaplain is present with the Pt. and the Pt. wife-Marie for updating of the Pt. Advance Directive:  HCPOA.  The Pt. did not complete a Living Will. The chaplain continued education from the previous day and gave the Pt. the opportunity to ask clarifying questions. This AD will supercede any previous documents.  The notary and witnesses are present at the bedside with the Pt., Lelan Pons, and the chaplain for the notarizing of the Pt. HCPOA.  The Pt. named Zaydyn Havey as his health care agent. If the healthcare agent is unwilling or unable to serve in this role the Pt. next choice is [employer]. The Pt. RN will update the Patient Contacts to reflect the changes.   The chaplain gave the Pt. the original AD along with two copies. The chaplain scanned the Pt. AD into the Pt. EMR.  The chaplain is available for F/U spiritual care as needed.  Chaplain Sallyanne Kuster [phone]

## 2022-08-07 NOTE — Progress Notes (Signed)
Patient noted to have a large dark brown bowel movement.  Also assessed was dark red blood in stool.

## 2022-08-07 NOTE — Discharge Instructions (Signed)
[facility]      [facility] °[phone] ° °OPEN ABDOMINAL SURGERY: POST OP INSTRUCTIONS ° °Always review your discharge instruction sheet given to you by the facility where your surgery was performed. ° °IF YOU HAVE DISABILITY OR FAMILY LEAVE FORMS, YOU MUST BRING THEM TO THE OFFICE FOR PROCESSING.  PLEASE DO NOT GIVE THEM TO YOUR DOCTOR. ° °A prescription for pain medication may be given to you upon discharge.  Take your pain medication as prescribed, if needed.  If narcotic pain medicine is not needed, then you may take acetaminophen (Tylenol) or ibuprofen (Advil) as needed. °Take your usually prescribed medications unless otherwise directed. °If you need a refill on your pain medication, please contact your pharmacy. They will contact our office to request authorization.  Prescriptions will not be filled after 5pm or on week-ends. °You should follow a light diet the first few days after arrival home, such as soup and crackers, pudding, etc.unless your doctor has advised otherwise. A high-fiber, low fat diet can be resumed as tolerated.   Be sure to include lots of fluids daily. Most patients will experience some swelling and bruising on the chest and neck area.  Ice packs will help.  Swelling and bruising can take several days to resolve °Most patients will experience some swelling and bruising in the area of the incision. Ice pack will help. Swelling and bruising can take several days to resolve..  °It is common to experience some constipation if taking pain medication after surgery.  Increasing fluid intake and taking a stool softener will usually help or prevent this problem from occurring.  A mild laxative (Milk of Magnesia or Miralax) should be taken according to package directions if there are no bowel movements after 48 hours. ° You may have steri-strips (small skin tapes) in place directly over the incision.  These strips should be left on the skin for 7-10 days.  If your surgeon used skin  glue on the incision, you may shower in 24 hours.  The glue will flake off over the next 2-3 weeks.  Any sutures or staples will be removed at the office during your follow-up visit. You may find that a light gauze bandage over your incision may keep your staples from being rubbed or pulled. You may shower and replace the bandage daily. °ACTIVITIES:  You may resume regular (light) daily activities beginning the next day--such as daily self-care, walking, climbing stairs--gradually increasing activities as tolerated.  You may have sexual intercourse when it is comfortable.  Refrain from any heavy lifting or straining until approved by your doctor. °You may drive when you no longer are taking prescription pain medication, you can comfortably wear a seatbelt, and you can safely maneuver your car and apply brakes ° °You should see your doctor in the office for a follow-up appointment approximately two weeks after your surgery.  Make sure that you call for this appointment within a day or two after you arrive home to insure a convenient appointment time. ° °WHEN TO CALL YOUR DOCTOR: °Fever over 101.0 °Inability to urinate °Nausea and/or vomiting °Extreme swelling or bruising °Continued bleeding from incision. °Increased pain, redness, or drainage from the incision. °Difficulty swallowing or breathing °Muscle cramping or spasms. °Numbness or tingling in hands or feet or around lips. ° °The clinic staff is available to answer your questions during regular business hours.  Please don’t hesitate to call and ask to speak to one of the nurses if you have concerns. ° °For   further questions, please visit [email or web address]  °

## 2022-08-07 NOTE — TOC Initial Note (Signed)
Transition of Care [facility]) - Initial/Assessment Note    Patient Details  Name: Ryan Garcia MRN: [id number] Date of Birth: [birth year]  Transition of Care [facility]) CM/SW Contact:    Cyndi Bender, RN Phone Number: 08/07/2022, 10:08 AM  Clinical Narrative:                 Spoke to wife and husband at bedside regarding transition needs. Patient agreeable to home health. Choice offered and patient and wife defers to this RNCM to find highly rated agency.  Cory with [name] accepted referral. DME ordered.  Patient agreeable to use in house provider [facility] for DME needs.  Kristin with [facility] notified of DME orders.  Address, Phone number and PCP verified.  TOC will continue to follow for needs.   Expected Discharge Plan: [place] Barriers to Discharge: Continued Medical Work up   Patient Goals and CMS Choice Patient states their goals for this hospitalization and ongoing recovery are:: return home CMS Medicare.gov Compare Post Acute Care list provided to:: Patient Represenative (must comment) Choice offered to / list presented to : Spouse      Expected Discharge Plan and Services   Discharge Planning Services: CM Consult Post Acute Care Choice: Durable Medical Equipment, Home Health Living arrangements for the past 2 months: Single Family Home                 DME Arranged: Bedside commode, Walker rolling DME Agency: [facility] Date DME Agency Contacted: 08/07/22 Time DME Agency Contacted: [id number] Representative spoke with at DME Agency: Erasmo Downer HH Arranged: PT [place]: [place] Date [place]: 08/07/22 Time [place]: [id number] Representative spoke with at [place]: Tommi Rumps  Prior Living Arrangements/Services Living arrangements for the past 2 months: [place] Lives with:: Spouse Patient language and need for interpreter reviewed:: Yes Do you feel safe going back to the place where you live?: Yes      Need for  Family Participation in Patient Care: Yes (Comment) Care giver support system in place?: Yes (comment)   Criminal Activity/Legal Involvement Pertinent to Current Situation/Hospitalization: No - Comment as needed  Activities of Daily Living Home Assistive Devices/Equipment: None ADL Screening (condition at time of admission) Patient's cognitive ability adequate to safely complete daily activities?: Yes Is the patient deaf or have difficulty hearing?: Yes Does the patient have difficulty seeing, even when wearing glasses/contacts?: No Does the patient have difficulty concentrating, remembering, or making decisions?: No Patient able to express need for assistance with ADLs?: Yes Does the patient have difficulty dressing or bathing?: No Independently performs ADLs?: Yes (appropriate for developmental age) Does the patient have difficulty walking or climbing stairs?: No Weakness of Legs: None Weakness of Arms/Hands: None  Permission Sought/Granted Permission sought to share information with : Investment banker, corporate granted to share info w AGENCY: HH        Emotional Assessment Appearance:: Appears stated age Attitude/Demeanor/Rapport: Gracious Affect (typically observed): Accepting Orientation: : Oriented to Self, Oriented to Place, Oriented to  Time, Oriented to Situation Alcohol / Substance Use: Not Applicable Psych Involvement: No (comment)  Admission diagnosis:  NSTEMI (non-ST elevated myocardial infarction) ([place]) [I21.4] Anemia, unspecified type [D64.9] Patient Active Problem List   Diagnosis Date Noted   Ascending colon malignant neoplasm ([place]) 08/01/2022   Benign neoplasm of transverse colon 08/01/2022   Benign neoplasm of sigmoid colon 08/01/2022   Benign neoplasm of rectum 08/01/2022  Duodenitis 08/01/2022   Right shoulder pain 11/21/2021   Carpal tunnel syndrome 11/21/2021   Atrial flutter with rapid ventricular response (HCC)    Rapid  atrial fibrillation ([place]) 10/11/2020   Poor balance 07/26/2020   Anemia 04/20/2020   Renal mass, right 04/18/2020   AKI (acute kidney injury) ([place]) 04/14/2020   Cholangitis 04/14/2020   Unstable angina (HCC)    Chest pain 04/11/2020   Coronary artery disease involving native coronary artery of native heart without angina pectoris 01/18/2019   Medicare annual wellness visit, subsequent 01/16/2019   Obesity (BMI 30-39.9) 07/09/2018   HOH (hard of hearing) 03/31/2018   Uncontrolled type 2 diabetes mellitus with hyperglycemia ([place]) 10/28/2017   Subclinical hypothyroidism 10/28/2017   Scoliosis    Obstructive sleep apnea on CPAP    Hx of skin cancer, basal cell    Hyperlipidemia associated with type 2 diabetes mellitus (HCC)    GERD (gastroesophageal reflux disease)    Chronic upper back pain    Arthritis    Allergy    NSTEMI (non-ST elevated myocardial infarction) ([place]) 06/18/2017   Actinic keratoses 04/29/2017   Positive colorectal cancer screening using Cologuard test 10/31/2016   BPH (benign prostatic hyperplasia) 09/04/2016   B12 deficiency 11/22/2015   Routine general medical examination at a health care facility 08/26/2015   Abnormal nuclear cardiac imaging test    Dyspnea on exertion 11/27/2013   Encounter for examination of normal volunteer in research study 05/29/2013   Prostate cancer screening 07/15/2012   CAD S/P percutaneous coronary angioplasty 05/17/2011   Nonspecific abnormal results of cardiovascular function study 04/30/2011   Abnormal EKG 04/06/2011   Right low back pain 01/13/2010   Insulin dependent diabetes mellitus 10/04/2006   ERECTILE DYSFUNCTION 10/04/2006   Primary hypertension 10/04/2006   Allergic rhinitis 10/04/2006   OSTEOARTHRITIS 10/04/2006   Sleep apnea 10/04/2006   EDEMA 10/04/2006   SKIN CANCER, HX OF 10/04/2006   PCP:  Abner Greenspan, MD Pharmacy:   [facility], [place] [address] Phone: [phone] Fax: [phone]  [id number] [place], [place] - [address] AT [facility] [address] [place] [address] Phone: [phone] Fax: [phone]     Social Determinants of Health (SDOH) Social History: SDOH Screenings   Food Insecurity: No Food Insecurity (07/31/2022)  Housing: Low Risk  (07/31/2022)  Transportation Needs: No Transportation Needs (07/31/2022)  Utilities: Not At Risk (07/31/2022)  Alcohol Screen: Low Risk  (06/29/2021)  Depression (PHQ2-9): Low Risk  (07/05/2022)  Financial Resource Strain: Low Risk  (06/29/2021)  Physical Activity: Inactive (06/29/2021)  Social Connections: Moderately Integrated (06/29/2021)  Stress: No Stress Concern Present (06/29/2021)  Tobacco Use: Low Risk  (08/04/2022)   SDOH Interventions:     Readmission Risk Interventions     No data to display

## 2022-08-07 NOTE — Progress Notes (Signed)
Physical Therapy Treatment Patient Details Name: Ryan Garcia MRN: [id number] DOB: 1934/06/08 Today's Date: 08/07/2022   History of Present Illness Pt is an 89 y.o. male who presented 07/30/22 with exertional chest pain and found to have elevated troponin and anemia. Pt admitted with NSTEMI and found ascending colon mass concerning for malignancy s/p endoscopy and colonoscopy 1/31. R colectomy on 2/2. PMH: arthritis, CAD, CKD, DM, GERD, HLD, hx of skin cancer, HTN, NSTEMI, paroxysmal atrial flutter, renal mass, scoliosis    PT Comments    Pt greeted seated EOB and agreeable to session with continued progress towards acute goals. Pt able to come to stand without AD and min guard for safety as pt needing increased time to steady self on rise with wide BOS. Pt demonstrating increased tolerance for gait with preference for single UE support of IV pole, HR max up to 112bpm during activity.  Educated pt re; importance and benefits of continued mobility post acutely and activity recommendations with pt verbalizing understanding. Pt continues to benefit from skilled PT services to progress toward functional mobility goals.    Recommendations for follow up therapy are one component of a multi-disciplinary discharge planning process, led by the attending physician.  Recommendations may be updated based on patient status, additional functional criteria and insurance authorization.  Follow Up Recommendations  Home health PT     Assistance Recommended at Discharge Intermittent Supervision/Assistance  Patient can return home with the following Assistance with cooking/housework;Assist for transportation;Help with stairs or ramp for entrance;A little help with walking and/or transfers;A little help with bathing/dressing/bathroom   Equipment Recommendations  None recommended by PT (pt and family state they have neighbors/friends they can borrow from as needed)    Recommendations for Other Services        Precautions / Restrictions Precautions Precautions: Fall;Other (comment) Precaution Comments: monitor HR (HRmax observed 112 bpm) Restrictions Weight Bearing Restrictions: No     Mobility  Bed Mobility Overal bed mobility: Needs Assistance Bed Mobility: Sit to Supine       Sit to supine: Min assist   General bed mobility comments: min a for BLEs back to bed and to position trunk    Transfers Overall transfer level: Needs assistance Equipment used: None Transfers: Sit to/from Stand Sit to Stand: Min guard           General transfer comment: Extra time to come to stand from EOB, min guard for safety    Ambulation/Gait Ambulation/Gait assistance: Min guard Gait Distance (Feet): 400 Feet Assistive device: IV Pole Gait Pattern/deviations: Step-through pattern, Decreased stride length, Trunk flexed Gait velocity: decr     General Gait Details: pt using IV pole for gait to self-steady   Stairs             Wheelchair Mobility    Modified Rankin (Stroke Patients Only)       Balance Overall balance assessment: Mild deficits observed, not formally tested                                          Cognition Arousal/Alertness: Awake/alert Behavior During Therapy: WFL for tasks assessed/performed, Flat affect Overall Cognitive Status: Within Functional Limits for tasks assessed  Exercises      General Comments General comments (skin integrity, edema, etc.): wife present and supportive throughout session, HR max up to 112bpm      Pertinent Vitals/Pain Pain Assessment Pain Assessment: Faces Faces Pain Scale: Hurts little more Pain Location: R flank intermittently Pain Descriptors / Indicators: Sharp Pain Intervention(s): Monitored during session, Limited activity within patient's tolerance    Home Living                          Prior Function            PT  Goals (current goals can now be found in the care plan section) Acute Rehab PT Goals Patient Stated Goal: to get home PT Goal Formulation: With patient/family Time For Goal Achievement: 08/16/22 Progress towards PT goals: Progressing toward goals    Frequency    Min 3X/week      PT Plan Current plan remains appropriate    Co-evaluation              AM-PAC PT "6 Clicks" Mobility   Outcome Measure  Help needed turning from your back to your side while in a flat bed without using bedrails?: A Little Help needed moving from lying on your back to sitting on the side of a flat bed without using bedrails?: A Little Help needed moving to and from a bed to a chair (including a wheelchair)?: A Little Help needed standing up from a chair using your arms (e.g., wheelchair or bedside chair)?: A Little Help needed to walk in hospital room?: A Little Help needed climbing 3-5 steps with a railing? : A Little 6 Click Score: 18    End of Session Equipment Utilized During Treatment: Gait belt Activity Tolerance: Patient tolerated treatment well Patient left: with call bell/phone within reach;with family/visitor present;in bed Nurse Communication: Mobility status PT Visit Diagnosis: Unsteadiness on feet (R26.81);Other abnormalities of gait and mobility (R26.89);Muscle weakness (generalized) (M62.81)     Time: 9983-3825 PT Time Calculation (min) (ACUTE ONLY): 19 min  Charges:  $Therapeutic Activity: 8-22 mins                     Raymonda Pell R. PTA Acute Rehabilitation Services Office: [place] 08/07/2022, 1:31 PM

## 2022-08-07 NOTE — Progress Notes (Signed)
Mobility Specialist Progress Note:   08/07/22 1640  Mobility  Activity Ambulated with assistance to bathroom;Ambulated with assistance in room  Level of Assistance Minimal assist, patient does 75% or more  Assistive Device Front wheel walker  Distance Ambulated (ft) 20 ft  Activity Response Tolerated well  $Mobility charge 1 Mobility   Pt in bed asking to go to bathroom. No complaints of pain. minA for bed mobility. Left in bathroom and was instructed to pull string when finished.    Gareth Eagle Rehman Levinson Mobility Specialist Please contact via [employer] or  Rehab Office at [phone]

## 2022-08-07 NOTE — Progress Notes (Signed)
Confined to one room.  Durable Medical Equipment (From admission, onward)        Start     Ordered  08/07/22 1001  For home use only DME Bedside commode  Once      Question:  Patient needs a bedside commode to treat with the following condition  Answer:  Weakness  08/07/22 1000  08/06/22 1608  For home use only DME Walker rolling  Once      Question Answer Comment Walker: With [place]  Patient needs a walker to treat with the following condition Weakness    08/06/22 1608

## 2022-08-07 NOTE — Progress Notes (Signed)
[facility] Progress Note  4 Days Post-Op  Subjective: CC-  Started having some RUQ abdominal pain early this morning after getting up to be weighed. Felt similar to a gallbladder attack he had in the past. Continues to have the pain but was able to eat all of his breakfast without worsening pain. Denies n/v. Passing some flatus but not as much as yesterday, no BM.   Objective: Vital signs in last 24 hours: Temp:  [98.1 F (36.7 C)-99.3 F (37.4 C)] 98.1 F (36.7 C) (02/06 0827) Pulse Rate:  [67-84] 84 (02/06 0827) Resp:  [18-24] 24 (02/06 0827) BP: (125-158)/(52-77) 158/69 (02/06 0827) SpO2:  [94 %-98 %] 94 % (02/06 0827) Weight:  [112.2 kg] 112.2 kg (02/06 0300) Last BM Date : 07/31/22  Intake/Output from previous day: 02/05 0701 - 02/06 0700 In: 830 [P.O.:240; IV Piggyback:590] Out: 1475 [Urine:1475] Intake/Output this shift: Total I/O In: 120 [P.O.:120] Out: 150 [Urine:150]  PE: Gen:  Alert, NAD Abd: soft, protuberant, appropriately tender over incision but also with some RUQ tenderness, midline incision cdi with staples present and no erythema or drainage, port site also cdi with staple present  Lab Results:  Recent Labs    08/06/22 0026 08/07/22 0026  WBC 7.9 8.2  HGB 8.0* 8.1*  HCT 25.6* 25.9*  PLT 175 186   BMET Recent Labs    08/06/22 0026 08/07/22 0026  NA 138 137  K 3.6 3.3*  CL 110 109  CO2 21* 22  GLUCOSE 243* 211*  BUN 18 11  CREATININE 1.41* 1.26*  CALCIUM 8.0* 8.0*   PT/INR No results for input(s): "LABPROT", "INR" in the last 72 hours. CMP     Component Value Date/Time   NA 137 08/07/2022 0026   NA 144 12/06/2021 1344   K 3.3 (L) 08/07/2022 0026   CL 109 08/07/2022 0026   CO2 22 08/07/2022 0026   GLUCOSE 211 (H) 08/07/2022 0026   BUN 11 08/07/2022 0026   BUN 22 12/06/2021 1344   CREATININE 1.26 (H) 08/07/2022 0026   CALCIUM 8.0 (L) 08/07/2022 0026   PROT 5.7 (L) 08/05/2022 0030   PROT 6.5 09/15/2019 0810    ALBUMIN 2.6 (L) 08/05/2022 0030   ALBUMIN 4.1 09/15/2019 0810   AST 13 (L) 08/05/2022 0030   ALT 13 08/05/2022 0030   ALKPHOS 67 08/05/2022 0030   BILITOT 0.8 08/05/2022 0030   BILITOT 0.3 09/15/2019 0810   GFRNONAA 56 (L) 08/07/2022 0026   GFRAA >60 07/03/2018 0304   Lipase     Component Value Date/Time   LIPASE 28 04/16/2020 0111       Studies/Results: No results found.  Anti-infectives: Anti-infectives (From admission, onward)    Start     Dose/Rate Route Frequency Ordered Stop   08/03/22 0600  cefoTEtan (CEFOTAN) 2 g in sodium chloride 0.9 % 100 mL IVPB        2 g 200 mL/hr over 30 Minutes Intravenous On call to O.R. 08/02/22 1527 08/03/22 1030        Assessment/Plan POD 4, s/p lap assisted right colectomy by Dr. Marlou Starks 08/03/22 for Ascending colon mass concerning for malignancy - c-scope path with adenoca as expected, surgical path pending - CEA 6.1. CT chest abdomen pelvis done without findings of metastatic disease.  - Tolerating CM diet and passing flatus, no BM. Add miralax. RUQ pain could be gas pains rather than gallbladder but will monitor.  -mobilization, pulm toilet, IS. Continue therapies - recommending home health PT  FEN: CM diet, glucerna ID: none VTE: eliquis   --Per primary-- NSTEMI - present on admission, likely secondary to demand ischemia. Cardiology following CAD s/p PCI afib on eliquis CHF HTN HLD OSA right renal mass DNR/DNI    LOS: 7 days    Wellington [place], [facility] 08/07/2022, 8:31 AM Please see Amion for pager number during day hours 7:00am-4:30pm

## 2022-08-07 NOTE — Progress Notes (Signed)
Pt has a home CPAP set up and ready at bedside. PT stated he will place on himself when ready.

## 2022-08-07 NOTE — Progress Notes (Signed)
PROGRESS NOTE    Ryan Garcia  KCL:[id number] DOB: 17-Nov-1933 DOA: 07/30/2022 PCP: Abner Greenspan, MD    Brief Narrative:  89 y.o. male with medical history significant for triple-vessel coronary artery disease status post PCI with stent placement, paroxysmal A-fib on Eliquis, chronic diastolic CHF, hypertension, hyperlipidemia, OSA on CPAP, right renal mass diagnosed 2 years ago, followed by urology presented with exertional chest pain.  On presentation, EKG did not show any evidence of acute ischemia but troponins were 93 and subsequently 425. Hemoglobin was 8.7 (from baseline of 13.1).  Eliquis was held.  He was started on IV Protonix. CT chest abdomen and pelvis with contrast showed no evidence of active bleeding however showed 2.5 x 2.6 cm enhancing lesion in the anterior right upper kidney grossly unchanged from prior MRI compatible with solid renal neoplasm such as renal cell carcinoma.  Cardiology and GI were consulted.  Palliative care was also consulted for goals of care discussion.   1/31: Underwent EGD that was normal biopsy obtained,Colonoscopy showed Malignant tumor in the mid descending colon biopsied, had polyps in transverse colon and sigmoid colon,CEA  and surgery consult obtained.  Patient had CT chest abdomen pelvis showing renal lesion but without suspicion for metastatic disease.  Palliative care consulted> patient interested to pursue surgical option.  Patient underwent laparoscopic colectomy 2/2.  Also followed by cardiology for non-STEMI.   Waiting to have a bowel movement for discharge home.   Assessment & Plan:   Malignant right colonic mass: S/p egs/ colo 1/31:EGD  normal ,Colonoscopy: Malignant tumor in the mid descending colon biopsied, had polyps in transverse colon and sigmoid colon,.CT chest abdomen pelvis showing renal lesion but without suspicion for metastatic disease.  CEA 6.1. S/p OR lap assisted rt colectomy 08/03/22. Cont diet, pain control per CCS. surgical  biopsy pending. Advancing to regular diet.  Bowel functions returning.  No BM yet.  Followed by surgery.  Okay to for Eliquis. Started on stool softener.  Dose of MiraLAX today as per surgery.   NSTEMI type II/demand ischemia: Cardiology was following.  troponin 93 peaked to 651> 609 flat trend EKG without significant ischemia, chest pain on admission.  Per cardiology likely demand ischemia in the setting of anemia with known CAD on  prior cath severe diffuse LAD and LCx disease.  Echo with EF 60 to 65%, no RWMA, due to anemia not pursuing invasive workup,.  Continue on metoprolol, statin and Imdur.   Iron deficiency anemia: Likely due to #1.  Receiving iron transfusions.  Will continue close monitoring.  Eliquis resumed.  Monitor for bleeding.    Chronic diastolic CBS:[id number] monitor intake output Daily weight continue Imdur and metoprolol.wt stable/improving as below   Acute kidney injury likely from dehydration baseline 1.1.  Fluctuating renal functions likely due to surgical distress.  Slightly worsening creatinine today.  Will continue IV fluids and recheck tomorrow.  Paroxysmal atrial fibrillation: Eliquis restarted 2/4.  Rate controlled on metoprolol.  Hypertension: BP stable, continue Imdur and metoprolol.  HLD continue statin  Right renal mass concerning for renal cell carcinoma follow-up with urology as outpatient  OSA continue CPAP nightly  Type 2 diabetes mellitus: A1c 7.8 blood sugar stable on SSI Blood sugars are persistently more than 150.  Patient is on insulin, glipizide at home.  Currently remains on sliding scale insulin. Increase Lantus 16 units daily.  Will continue to uptitrate once patient is able to eat.   Physical deconditioning continue PT OT, recommended home after clinically stable.  Goals of care palliative care following currently DNR.  Desires all clinical treatment.    DVT prophylaxis: SCD's Start: 08/03/22 1323 apixaban (ELIQUIS) tablet 5 mg    Code Status: DNR Family Communication: Wife at the bedside Disposition Plan: Status is: Inpatient Remains inpatient appropriate because: Wait for diet tolerance and bowel function.  Monitor for bleeding.     Consultants:  Palliative care General surgery Cardiology  Procedures:  Laparoscopic colectomy 2/2  Antimicrobials:  Perioperative   Subjective:  Patient seen and examined.  Wife at the bedside.  Denies any complaints at rest.  When he is taking deep breath he has a catchy pain on the right lower quadrant.  Passing flatus.  He ate regular diet last night.  No bowel movement yet.  Hemoglobin remains stable.  Mobilize around.  Appropriately sore on mobilization.  Objective: Vitals:   08/06/22 2322 08/07/22 0300 08/07/22 0328 08/07/22 0827  BP: (!) 135/52  (!) 152/75 (!) 158/69  Pulse: 70  82 84  Resp: (!) 22  19 (!) 24  Temp: 99.3 F (37.4 C)  98.2 F (36.8 C) 98.1 F (36.7 C)  TempSrc: Oral  Oral Oral  SpO2: 98%  95% 94%  Weight:  112.2 kg    Height:        Intake/Output Summary (Last 24 hours) at 08/07/2022 1014 Last data filed at 08/07/2022 0825 Gross per 24 hour  Intake 440 ml  Output 1425 ml  Net -985 ml    Filed Weights   08/05/22 0617 08/06/22 0352 08/07/22 0300  Weight: 112.1 kg 113.1 kg 112.2 kg    Examination:  General exam: Appears calm and comfortable.  Hard of hearing.  Pleasant to conversation.  Interactive. Respiratory system: Clear to auscultation. Respiratory effort normal.  No added sounds. Cardiovascular system: S1 & S2 heard, RRR.  No pedal edema. Gastrointestinal system: Distended.  Soft.  Mildly tender all over.  Incisions are clean and dry.  Bowel sounds present. Central nervous system: Alert and oriented. No focal neurological deficits. Extremities: Symmetric 5 x 5 power. Skin: No rashes, lesions or ulcers Psychiatry: Judgement and insight appear normal. Mood & affect appropriate.     Data Reviewed: I have personally reviewed  following labs and imaging studies  CBC: Recent Labs  Lab 08/02/22 0039 08/03/22 0044 08/04/22 0019 08/05/22 0030 08/06/22 0026 08/07/22 0026  WBC 8.0 6.2 13.1* 10.0 7.9 8.2  NEUTROABS 6.1  --   --  8.2* 6.3 6.2  HGB 8.1* 8.0* 9.2* 8.1* 8.0* 8.1*  HCT 26.9* 26.2* 30.2* 27.4* 25.6* 25.9*  MCV 82.8 81.6 82.1 84.0 82.3 81.7  PLT 182 170 188 194 175 540    Basic Metabolic Panel: Recent Labs  Lab 08/01/22 0041 08/02/22 0039 08/03/22 0044 08/04/22 0019 08/05/22 0030 08/06/22 0026 08/07/22 0026  NA 141 136 138 138 139 138 137  K 4.1 3.9 4.0 3.9 3.7 3.6 3.3*  CL 109 107 110 109 112* 110 109  CO2 22 23 21* 18* 21* 21* 22  GLUCOSE 186* 183* 196* 242* 164* 243* 211*  BUN '17 13 12 13 16 18 11  '$ CREATININE 1.16 1.16 1.23 1.26* 1.25* 1.41* 1.26*  CALCIUM 9.0 8.2* 8.3* 8.4* 8.3* 8.0* 8.0*  MG 1.9 1.7 1.9  --  1.9  --   --     GFR: Estimated Creatinine Clearance: 53.6 mL/min (A) (by C-G formula based on SCr of 1.26 mg/dL (H)). Liver Function Tests: Recent Labs  Lab 08/01/22 0041 08/05/22 0030  AST 24  13*  ALT 18 13  ALKPHOS 78 67  BILITOT 0.9 0.8  PROT 6.1* 5.7*  ALBUMIN 3.3* 2.6*    No results for input(s): "LIPASE", "AMYLASE" in the last 168 hours. No results for input(s): "AMMONIA" in the last 168 hours. Coagulation Profile: No results for input(s): "INR", "PROTIME" in the last 168 hours.  Cardiac Enzymes: No results for input(s): "CKTOTAL", "CKMB", "CKMBINDEX", "TROPONINI" in the last 168 hours. BNP (last 3 results) No results for input(s): "PROBNP" in the last 8760 hours. HbA1C: No results for input(s): "HGBA1C" in the last 72 hours. CBG: Recent Labs  Lab 08/06/22 1101 08/06/22 1642 08/06/22 2018 08/06/22 2209 08/07/22 0527  GLUCAP 304* 289* 271* 242* 181*    Lipid Profile: No results for input(s): "CHOL", "HDL", "LDLCALC", "TRIG", "CHOLHDL", "LDLDIRECT" in the last 72 hours. Thyroid Function Tests: No results for input(s): "TSH", "T4TOTAL",  "FREET4", "T3FREE", "THYROIDAB" in the last 72 hours. Anemia Panel: No results for input(s): "VITAMINB12", "FOLATE", "FERRITIN", "TIBC", "IRON", "RETICCTPCT" in the last 72 hours. Sepsis Labs: No results for input(s): "PROCALCITON", "LATICACIDVEN" in the last 168 hours.   Recent Results (from the past 240 hour(s))  MRSA Next Gen by PCR, Nasal     Status: None   Collection Time: 07/31/22  4:30 PM   Specimen: Nasal Mucosa; Nasal Swab  Result Value Ref Range Status   MRSA by PCR Next Gen NOT DETECTED NOT DETECTED Final    Comment: (NOTE) The GeneXpert MRSA Assay (FDA approved for NASAL specimens only), is one component of a comprehensive MRSA colonization surveillance program. It is not intended to diagnose MRSA infection nor to guide or monitor treatment for MRSA infections. Test performance is not FDA approved in patients less than 74 years old. Performed at [facility], [place] [address], [place], [place] [address]          Radiology Studies: No results found.      Scheduled Meds:  acetaminophen  1,000 mg Oral Q6H   apixaban  5 mg Oral BID   docusate sodium  100 mg Oral BID   feeding supplement (GLUCERNA SHAKE)  237 mL Oral BID BM   insulin aspart  0-15 Units Subcutaneous TID AC & HS   insulin detemir  16 Units Subcutaneous Daily   isosorbide mononitrate  30 mg Oral Daily   metoprolol tartrate  12.5 mg Oral BID   pantoprazole  40 mg Oral Q0600   polyethylene glycol  17 g Oral Daily   rosuvastatin  40 mg Oral Daily   Continuous Infusions:  ferric gluconate (FERRLECIT) IVPB 250 mg (08/07/22 0855)     LOS: 7 days    Time spent: 35 minutes    Barb Merino, MD [facility] Pager [phone]

## 2022-08-08 ENCOUNTER — Inpatient Hospital Stay (HOSPITAL_COMMUNITY): Payer: Medicare HMO

## 2022-08-08 DIAGNOSIS — R609 Edema, unspecified: Secondary | ICD-10-CM

## 2022-08-08 DIAGNOSIS — I214 Non-ST elevation (NSTEMI) myocardial infarction: Secondary | ICD-10-CM | POA: Diagnosis not present

## 2022-08-08 DIAGNOSIS — M7989 Other specified soft tissue disorders: Secondary | ICD-10-CM

## 2022-08-08 DIAGNOSIS — C182 Malignant neoplasm of ascending colon: Secondary | ICD-10-CM | POA: Diagnosis not present

## 2022-08-08 DIAGNOSIS — R531 Weakness: Secondary | ICD-10-CM | POA: Diagnosis not present

## 2022-08-08 LAB — URINALYSIS, ROUTINE W REFLEX MICROSCOPIC
Bacteria, UA: NONE SEEN
Bilirubin Urine: NEGATIVE
Glucose, UA: >=500 mg/dL — AB
Hgb urine dipstick: NEGATIVE
Ketones, ur: 5 mg/dL — AB
Leukocytes,Ua: NEGATIVE
Nitrite: NEGATIVE
Protein, ur: 100 mg/dL — AB
Specific Gravity, Urine: 1.016 (ref 1.005–1.030)
pH: 5 (ref 5.0–8.0)

## 2022-08-08 LAB — BASIC METABOLIC PANEL
Anion gap: 11 (ref 5–15)
BUN: 10 mg/dL (ref 8–23)
CO2: 21 mmol/L — ABNORMAL LOW (ref 22–32)
Calcium: 8.4 mg/dL — ABNORMAL LOW (ref 8.9–10.3)
Chloride: 106 mmol/L (ref 98–111)
Creatinine, Ser: 1.32 mg/dL — ABNORMAL HIGH (ref 0.61–1.24)
GFR, Estimated: 53 mL/min — ABNORMAL LOW (ref >60)
Glucose, Bld: 208 mg/dL — ABNORMAL HIGH (ref 70–99)
Potassium: 4.2 mmol/L (ref 3.5–5.1)
Sodium: 138 mmol/L (ref 135–145)

## 2022-08-08 LAB — MAGNESIUM: Magnesium: 1.8 mg/dL (ref 1.7–2.4)

## 2022-08-08 LAB — CBC
HCT: 33.2 % — ABNORMAL LOW (ref 39.0–52.0)
Hemoglobin: 10.1 g/dL — ABNORMAL LOW (ref 13.0–17.0)
MCH: 25 pg — ABNORMAL LOW (ref 26.0–34.0)
MCHC: 30.4 g/dL (ref 30.0–36.0)
MCV: 82.2 fL (ref 80.0–100.0)
Platelets: 242 10*3/uL (ref 150–400)
RBC: 4.04 MIL/uL — ABNORMAL LOW (ref 4.22–5.81)
RDW: 18.1 % — ABNORMAL HIGH (ref 11.5–15.5)
WBC: 15.3 10*3/uL — ABNORMAL HIGH (ref 4.0–10.5)
nRBC: 0.3 % — ABNORMAL HIGH (ref 0.0–0.2)

## 2022-08-08 LAB — TYPE AND SCREEN
ABO/RH(D): O NEG
Antibody Screen: NEGATIVE

## 2022-08-08 LAB — GLUCOSE, CAPILLARY
Glucose-Capillary: 182 mg/dL — ABNORMAL HIGH (ref 70–99)
Glucose-Capillary: 223 mg/dL — ABNORMAL HIGH (ref 70–99)
Glucose-Capillary: 286 mg/dL — ABNORMAL HIGH (ref 70–99)
Glucose-Capillary: 221 mg/dL — ABNORMAL HIGH (ref 70–99)

## 2022-08-08 LAB — PROTIME-INR
INR: 1.7 — ABNORMAL HIGH (ref 0.8–1.2)
Prothrombin Time: 19.4 seconds — ABNORMAL HIGH (ref 11.4–15.2)

## 2022-08-08 LAB — HEMOGLOBIN AND HEMATOCRIT, BLOOD
HCT: 29.3 % — ABNORMAL LOW (ref 39.0–52.0)
Hemoglobin: 9 g/dL — ABNORMAL LOW (ref 13.0–17.0)

## 2022-08-08 LAB — BRAIN NATRIURETIC PEPTIDE: B Natriuretic Peptide: 292 pg/mL — ABNORMAL HIGH (ref 0.0–100.0)

## 2022-08-08 MED ORDER — FUROSEMIDE 20 MG PO TABS
20.0000 mg | ORAL_TABLET | Freq: Every day | ORAL | Status: DC
  Administered 2022-08-08 – 2022-08-11 (×4): 20 mg via ORAL
  Filled 2022-08-08 (×4): qty 1

## 2022-08-08 MED ORDER — POLYETHYLENE GLYCOL 3350 17 G PO PACK
17.0000 g | PACK | Freq: Two times a day (BID) | ORAL | Status: DC
  Administered 2022-08-08 – 2022-08-11 (×6): 17 g via ORAL
  Filled 2022-08-08 (×6): qty 1

## 2022-08-08 MED ORDER — INSULIN DETEMIR 100 UNIT/ML ~~LOC~~ SOLN
15.0000 [IU] | Freq: Every day | SUBCUTANEOUS | Status: DC
  Administered 2022-08-09 – 2022-08-11 (×3): 15 [IU] via SUBCUTANEOUS
  Filled 2022-08-08 (×3): qty 0.15

## 2022-08-08 MED ORDER — INSULIN DETEMIR 100 UNIT/ML ~~LOC~~ SOLN: 10.0000 [IU] | Freq: Every day | SUBCUTANEOUS | Status: DC

## 2022-08-08 MED ORDER — IOHEXOL 350 MG/ML SOLN
75.0000 mL | Freq: Once | INTRAVENOUS | Status: AC | PRN
  Administered 2022-08-08: 75 mL via INTRAVENOUS

## 2022-08-08 NOTE — Progress Notes (Addendum)
PROGRESS NOTE    Ryan Garcia  BOF:[id number] DOB: [birth year] DOA: 07/30/2022 PCP: Abner Greenspan, MD  Chief Complaint  Patient presents with   Chest Pain    Brief Narrative:  89 y.o. male with medical history significant for triple-vessel coronary artery disease status post PCI with stent placement, paroxysmal Ryan Garcia-fib on Eliquis, chronic diastolic CHF, hypertension, hyperlipidemia, OSA on CPAP, right renal mass diagnosed 2 years ago, followed by urology presented with exertional chest pain.  On presentation, EKG did not show any evidence of acute ischemia but troponins were 93 and subsequently 425. Hemoglobin was 8.7 (from baseline of 13.1).  Eliquis was held.  He was started on IV Protonix. CT chest abdomen and pelvis with contrast showed no evidence of active bleeding however showed 2.5 x 2.6 cm enhancing lesion in the anterior right upper kidney grossly unchanged from prior MRI compatible with solid renal neoplasm such as renal cell carcinoma.  Cardiology and GI were consulted.  Palliative care was also consulted for goals of care discussion.    1/31: Underwent EGD that was normal biopsy obtained,Colonoscopy showed Malignant tumor in the mid descending colon biopsied, had polyps in transverse colon and sigmoid colon,CEA  and surgery consult obtained.  Patient had CT chest abdomen pelvis showing renal lesion but without suspicion for metastatic disease.  Palliative care consulted> patient interested to pursue surgical option.  Patient underwent laparoscopic colectomy 2/2.  Also followed by cardiology for non-STEMI.   Waiting to have Ryan Garcia bowel movement for discharge home.   Assessment & Plan:   Principal Problem:   NSTEMI (non-ST elevated myocardial infarction) ([place]) Active Problems:   Ascending colon malignant neoplasm (HCC)   Benign neoplasm of transverse colon   Benign neoplasm of sigmoid colon   Benign neoplasm of rectum   Duodenitis  BRBPR Eliquis on hold, will continue to  monitor Likely related to recent surgery Will discuss with surgery timing for resumption  Systemic Inflammatory Response Syndrome  Right Upper Quadrant Pain Elevated temp, fever CT abd/pelvis per surgery Will get CXR and UA as well  Has R sided TTP as well as pleuritic pain If recurrent fever, would obtain blood cultures and start abx  Malignant right colonic mass: S/p egd/colonoscopy 1/31 Colonoscopy with malignant tumor in the mid descending colon biopsied, had polyps in transverse colon and sigmoid colon CT chest abdomen pelvis showing renal lesion but without suspicion for metastatic disease.  CEA 6.1. S/p OR lap assisted rt colectomy 08/03/22.  Surg path 08/03/22 with invasive moderately differentiated adenocarcinoma with extracellular mucin, carcinoma very focally invades into the pericolonic soft tissue, resection margins negative for carcinoma, metastatic carcinoma to one of eighteen LN's, one tumor deposit (see report) CT abd/pelvis for RUQ pain NPO per surgery  Holding eliquis for now Appreciate further surgery recommendations   NSTEMI type II/demand ischemia: Last cardiology note appears to be 08/03/2022.   Trop peaked at 651 Suspected demand ischemia in setting of anemia.  Known CAD on prior cath.  No plan for invasive workup.  Keep off anticoagulation.  Continue metop, statin, imdur.   Echo 1/30 with EF 60-65%, no RWMA, mildly elevated PASP   Iron deficiency anemia: Likely due to colon cancer.  Receiving iron via IV.  Will continue close monitoring.  Holding eliquis at this time.     Chronic diastolic CHF: compensated Has R>L LE swelling today On 20 mg lasix daily at home Continue metoprolol and imdur Strict I/O, daily weights   Acute kidney injury  Creatinine fluctuating,  baseline 1.1.  Peaked at 1.49.  Trend.   Paroxysmal atrial fibrillation: Eliquis on hold.  Rate controlled on metoprolol.   Hypertension: BP stable, continue Imdur and metoprolol.   HLD continue  statin   Right renal mass concerning for renal cell carcinoma follow-up with urology as outpatient   OSA continue CPAP nightly   Type 2 diabetes mellitus: A1c 7.8 blood sugar stable on SSI Blood sugars are persistently more than 150.  Patient is on insulin, glipizide at home.  Currently remains on sliding scale insulin. Increase Lantus 16 units daily.  Will continue to uptitrate once patient is able to eat.   Physical deconditioning continue PT OT, recommended home after clinically stable.     Goals of care palliative care following currently DNR.  Desires all clinical treatment.  Hard of hearing    DVT prophylaxis: eliquis - holding today Code Status: dnr/dni Family Communication: wife at bedside  Disposition:   Status is: Inpatient Remains inpatient appropriate because: continued need for inpt care    Consultants:  Palliative care   Procedures:  Echo IMPRESSIONS     1. Left ventricular ejection fraction, by estimation, is 60 to 65%. The  left ventricle has normal function. The left ventricle has no regional  wall motion abnormalities. There is mild concentric left ventricular  hypertrophy. Left ventricular diastolic  function could not be evaluated.   2. Right ventricular systolic function is normal. The right ventricular  size is normal. There is mildly elevated pulmonary artery systolic  pressure. The estimated right ventricular systolic pressure is 23.5 mmHg.   3. The mitral valve is grossly normal. No evidence of mitral valve  regurgitation. No evidence of mitral stenosis.   4. The aortic valve is tricuspid. There is mild calcification of the  aortic valve. There is mild thickening of the aortic valve. Aortic valve  regurgitation is not visualized. Aortic valve sclerosis/calcification is  present, without any evidence of  aortic stenosis.   5. The inferior vena cava is normal in size with greater than 50%  respiratory variability, suggesting right atrial  pressure of 3 mmHg.   Comparison(s): No significant change from prior study.   2/2 LAPAROSCOPIC ASSISTED RIGHT COLECTOMY   Antimicrobials:  Anti-infectives (From admission, onward)    Start     Dose/Rate Route Frequency Ordered Stop   08/03/22 0600  cefoTEtan (CEFOTAN) 2 g in sodium chloride 0.9 % 100 mL IVPB        2 g 200 mL/hr over 30 Minutes Intravenous On call to O.R. 08/02/22 1527 08/03/22 1030       Subjective: Hoh, no complaints  Wife notes hx of R side pain   Objective: Vitals:   08/07/22 2317 08/08/22 0344 08/08/22 0624 08/08/22 0716  BP: (!) 154/81 (!) 168/84  (!) 171/73  Pulse: 69 85    Resp: 20 17    Temp: 98.2 F (36.8 C) 100.3 F (37.9 C)  97.8 F (36.6 C)  TempSrc: Oral Oral  Oral  SpO2: 93% 95%    Weight:   111.4 kg   Height:        Intake/Output Summary (Last 24 hours) at 08/08/2022 0851 Last data filed at 08/08/2022 0626 Gross per 24 hour  Intake 120 ml  Output 950 ml  Net -830 ml   Filed Weights   08/06/22 0352 08/07/22 0300 08/08/22 0624  Weight: 113.1 kg 112.2 kg 111.4 kg    Examination:  General exam: Appears calm and comfortable  Respiratory system: unlabored  Cardiovascular system: irregularly irregular  Gastrointestinal system: Abdomen is nondistended, soft and nontender.  Central nervous system: Alert and oriented. No focal neurological deficits. Extremities: RLE edema  Psychiatry: Judgement and insight appear normal. Mood & affect appropriate.     Data Reviewed: I have personally reviewed following labs and imaging studies  CBC: Recent Labs  Lab 08/02/22 0039 08/03/22 0044 08/04/22 0019 08/05/22 0030 08/06/22 0026 08/07/22 0026 08/08/22 0741  WBC 8.0   < > 13.1* 10.0 7.9 8.2 15.3*  NEUTROABS 6.1  --   --  8.2* 6.3 6.2  --   HGB 8.1*   < > 9.2* 8.1* 8.0* 8.1* 10.1*  HCT 26.9*   < > 30.2* 27.4* 25.6* 25.9* 33.2*  MCV 82.8   < > 82.1 84.0 82.3 81.7 82.2  PLT 182   < > 188 194 175 186 242   < > = values in this  interval not displayed.    Basic Metabolic Panel: Recent Labs  Lab 08/02/22 0039 08/03/22 0044 08/04/22 0019 08/05/22 0030 08/06/22 0026 08/07/22 0026 08/08/22 0741  NA 136 138 138 139 138 137 138  K 3.9 4.0 3.9 3.7 3.6 3.3* 4.2  CL 107 110 109 112* 110 109 106  CO2 23 21* 18* 21* 21* 22 21*  GLUCOSE 183* 196* 242* 164* 243* 211* 208*  BUN '13 12 13 16 18 11 10  '$ CREATININE 1.16 1.23 1.26* 1.25* 1.41* 1.26* 1.32*  CALCIUM 8.2* 8.3* 8.4* 8.3* 8.0* 8.0* 8.4*  MG 1.7 1.9  --  1.9  --   --  1.8    GFR: Estimated Creatinine Clearance: 51 mL/min (Nahum Sherrer) (by C-G formula based on SCr of 1.32 mg/dL (H)).  Liver Function Tests: Recent Labs  Lab 08/05/22 0030  AST 13*  ALT 13  ALKPHOS 67  BILITOT 0.8  PROT 5.7*  ALBUMIN 2.6*    CBG: Recent Labs  Lab 08/07/22 0527 08/07/22 1145 08/07/22 1654 08/07/22 2122 08/08/22 0608  GLUCAP 181* 246* 256* 265* 182*     Recent Results (from the past 240 hour(s))  MRSA Next Gen by PCR, Nasal     Status: None   Collection Time: 07/31/22  4:30 PM   Specimen: Nasal Mucosa; Nasal Swab  Result Value Ref Range Status   MRSA by PCR Next Gen NOT DETECTED NOT DETECTED Final    Comment: (NOTE) The GeneXpert MRSA Assay (FDA approved for NASAL specimens only), is one component of [facility] comprehensive MRSA colonization surveillance program. It is not intended to diagnose MRSA infection nor to guide or monitor treatment for MRSA infections. Test performance is not FDA approved in patients less than 38 years old. Performed at [facility], [place] [address], [place], [place] [address]          Radiology Studies: No results found.      Scheduled Meds:  acetaminophen  1,000 mg Oral Q6H   docusate sodium  100 mg Oral BID   feeding supplement (GLUCERNA SHAKE)  237 mL Oral BID BM   insulin aspart  0-15 Units Subcutaneous TID AC & HS   insulin detemir  20 Units Subcutaneous Daily   isosorbide mononitrate  30 mg Oral Daily   metoprolol  tartrate  12.5 mg Oral BID   pantoprazole  40 mg Oral Q0600   polyethylene glycol  17 g Oral Daily   rosuvastatin  40 mg Oral Daily   Continuous Infusions:  ferric gluconate (FERRLECIT) IVPB 250 mg (08/07/22 0855)  LOS: 8 days    Time spent: over 30 min    Fayrene Helper, MD [facility]   To contact the attending provider between 7A-7P or the covering provider during after hours 7P-7A, please log into the web site [email or web address] and access using universal [place] password for that web site. If you do not have the password, please call the hospital operator.  08/08/2022, 8:51 AM

## 2022-08-08 NOTE — Plan of Care (Signed)
Dicussed with patient plan of care for the evening, pain management and recording his urinary output with some teach back displayed.  Gave him a male urinal to use in the bed.  Problem: Education: Goal: Ability to describe self-care measures that may prevent or decrease complications (Diabetes Survival Skills Education) will improve Outcome: Progressing   Problem: Coping: Goal: Ability to adjust to condition or change in health will improve Outcome: Progressing

## 2022-08-08 NOTE — Progress Notes (Signed)
Patient ID: TRUSTIN CHAPA, male   DOB: Mar 15, 1934, 89 y.o.   MRN: [id number]   Progress Note from the Palliative Medicine Team at [facility]   Patient Name: DUGLAS HEIER        Date: 08/08/2022 DOB: 1933/11/15  Age: 89 y.o. MRN#: [id number] Attending Physician: Elodia Florence., * Primary Care Physician: Tower, Wynelle Fanny, MD Admit Date: 07/30/2022   Medical records reviewed, discussed with treatment team   89 y.o. male with medical history significant for CAD s/p PCI, afib on eliquis, CHF, HTN, HLD, OSA, right renal mass who presented [facility] ED via EMS with chest pain. He was also found to have anemia with concern GI bleed with heme positive stool. He was admitted to the medicine service and GI and cardiology consulted.    He was found to have NSTEMI related to demand ischemia in setting of anemia and eliquis held.    Patient underwent colonoscopy and endoscopy by GI with polyps in proximal ascending, transverse (biopsied), and sigmoid (biopsied) colon and rectum and malignant tumor in mid ascending transverse colon (biopsied). Endoscopy with findings of erythema in duodenum which was biopsied.  Biopsy significant for adenocarcinoma  Patient is postop day 5, status post lap with right colectomy.   Now with right upper quadrant palpable fullness and tenderness.  Reported large dark stool yesterday evening.  CT of  abdomen and pelvis per surgery.  Hemoglobin 10.1  This NP visited patient at the bedside  as a follow up for palliative medicine needs and emotional support.    Mr Neis is alert and oriented x 3.  He is sitting on the side of the bed and reports "I had a rough night"   Wife at bedside   Mr. Gulden's has high expectations for rapid recovery.   Continued education regarding overall medical situation and postop recovery trajectory for this patient, at this time  Wife is tearful, she has been staying most nights at the bedside to assist her husband.  He verbalizes concerns  regarding nursing care needs once patient is discharged home.  Encouraged wife and patient to talk with their family regarding anticipatory care needs on discharge.    Education offered on the importance of continued mobility, overall nutrition and  the importance of continued conversation with his family and the medical providers regarding overall plan of care and treatment options,  ensuring decisions are within the context of the patients values and GOCs.  Questions and concerns addressed     35 minutes   [name] NP  Palliative Medicine Team Team Phone # [phone] Pager [phone]

## 2022-08-08 NOTE — Progress Notes (Signed)
Bilateral lower ext venous  has been completed. Refer to [facility] under chart review to view preliminary results.   08/08/2022  11:22 AM Elenor Quinones, Bonnye Fava

## 2022-08-08 NOTE — Progress Notes (Signed)
[facility] Progress Note  5 Days Post-Op  Subjective: CC-  Continues to have RUQ abdominal pain. States that the pain is intermittent, sharp at times. Denies any n/v. PO intake does not make his pain worse. He ate 100% of his breakfast. Passing flatus and had a moderate BM last night. WBC up 15.3, TMAX 100.3  Objective: Vital signs in last 24 hours: Temp:  [97.8 F (36.6 C)-100.3 F (37.9 C)] 97.8 F (36.6 C) (02/07 0716) Pulse Rate:  [69-85] 85 (02/07 0344) Resp:  [17-20] 17 (02/07 0344) BP: (142-176)/(65-84) 171/73 (02/07 0716) SpO2:  [93 %-95 %] 95 % (02/07 0344) Weight:  [111.4 kg] 111.4 kg (02/07 0624) Last BM Date : 08/07/22  Intake/Output from previous day: 02/06 0701 - 02/07 0700 In: 240 [P.O.:240] Out: 1100 [Urine:1100] Intake/Output this shift: No intake/output data recorded.  PE: Gen:  Alert, NAD Abd: soft, protuberant, focally tender in the RUQ with some palpable fullness, midline incision cdi with staples present and no erythema or drainage, port site also cdi with staple present  Lab Results:  Recent Labs    08/07/22 0026 08/08/22 0741  WBC 8.2 15.3*  HGB 8.1* 10.1*  HCT 25.9* 33.2*  PLT 186 242   BMET Recent Labs    08/07/22 0026 08/08/22 0741  NA 137 138  K 3.3* 4.2  CL 109 106  CO2 22 21*  GLUCOSE 211* 208*  BUN 11 10  CREATININE 1.26* 1.32*  CALCIUM 8.0* 8.4*   PT/INR Recent Labs    08/08/22 0741  LABPROT 19.4*  INR 1.7*   CMP     Component Value Date/Time   NA 138 08/08/2022 0741   NA 144 12/06/2021 1344   K 4.2 08/08/2022 0741   CL 106 08/08/2022 0741   CO2 21 (L) 08/08/2022 0741   GLUCOSE 208 (H) 08/08/2022 0741   BUN 10 08/08/2022 0741   BUN 22 12/06/2021 1344   CREATININE 1.32 (H) 08/08/2022 0741   CALCIUM 8.4 (L) 08/08/2022 0741   PROT 5.7 (L) 08/05/2022 0030   PROT 6.5 09/15/2019 0810   ALBUMIN 2.6 (L) 08/05/2022 0030   ALBUMIN 4.1 09/15/2019 0810   AST 13 (L) 08/05/2022 0030   ALT 13 08/05/2022  0030   ALKPHOS 67 08/05/2022 0030   BILITOT 0.8 08/05/2022 0030   BILITOT 0.3 09/15/2019 0810   GFRNONAA 53 (L) 08/08/2022 0741   GFRAA >60 07/03/2018 0304   Lipase     Component Value Date/Time   LIPASE 28 04/16/2020 0111       Studies/Results: No results found.  Anti-infectives: Anti-infectives (From admission, onward)    Start     Dose/Rate Route Frequency Ordered Stop   08/03/22 0600  cefoTEtan (CEFOTAN) 2 g in sodium chloride 0.9 % 100 mL IVPB        2 g 200 mL/hr over 30 Minutes Intravenous On call to O.R. 08/02/22 1527 08/03/22 1030        Assessment/Plan POD 5, s/p lap assisted right colectomy by Dr. Marlou Starks 08/03/22 for Ascending colon mass concerning for malignancy - surgical path: Invasive moderately differentiated adenocarcinoma with extracellular mucin 6 cm, Carcinoma very focally invades into the pericolonic soft tissue, Metastatic carcinoma to one of eighteen lymph nodes (1/18), one tumor  Deposit. Will need oncology follow up. - CEA 6.1. CT chest abdomen pelvis done without findings of metastatic disease.  - Ongoing RUQ pain/fullness and worsening leukocytosis today. Will make NPO and obtain CT scan. Hold eliquis in case intervention is  needed. -mobilization, pulm toilet, IS. Continue therapies - recommending home health PT   FEN: NPO ID: none VTE: hold eliquis   --Per primary-- NSTEMI - present on admission, likely secondary to demand ischemia. Cardiology following CAD s/p PCI afib on eliquis CHF HTN HLD OSA right renal mass DNR/DNI    LOS: 8 days    Wellington Hampshire, [facility] Surgery 08/08/2022, 8:27 AM Please see Amion for pager number during day hours 7:00am-4:30pm

## 2022-08-09 DIAGNOSIS — C182 Malignant neoplasm of ascending colon: Secondary | ICD-10-CM | POA: Diagnosis not present

## 2022-08-09 DIAGNOSIS — R531 Weakness: Secondary | ICD-10-CM | POA: Diagnosis not present

## 2022-08-09 DIAGNOSIS — I214 Non-ST elevation (NSTEMI) myocardial infarction: Secondary | ICD-10-CM | POA: Diagnosis not present

## 2022-08-09 LAB — CBC WITH DIFFERENTIAL/PLATELET
Abs Immature Granulocytes: 0.2 10*3/uL — ABNORMAL HIGH (ref 0.00–0.07)
Basophils Absolute: 0 10*3/uL (ref 0.0–0.1)
Basophils Relative: 0 %
Eosinophils Absolute: 0.3 10*3/uL (ref 0.0–0.5)
Eosinophils Relative: 2 %
HCT: 29.2 % — ABNORMAL LOW (ref 39.0–52.0)
Hemoglobin: 8.8 g/dL — ABNORMAL LOW (ref 13.0–17.0)
Immature Granulocytes: 2 %
Lymphocytes Relative: 9 %
Lymphs Abs: 1.2 10*3/uL (ref 0.7–4.0)
MCH: 25.3 pg — ABNORMAL LOW (ref 26.0–34.0)
MCHC: 30.1 g/dL (ref 30.0–36.0)
MCV: 83.9 fL (ref 80.0–100.0)
Monocytes Absolute: 1.2 10*3/uL — ABNORMAL HIGH (ref 0.1–1.0)
Monocytes Relative: 9 %
Neutro Abs: 10.3 10*3/uL — ABNORMAL HIGH (ref 1.7–7.7)
Neutrophils Relative %: 78 %
Platelets: 206 10*3/uL (ref 150–400)
RBC: 3.48 MIL/uL — ABNORMAL LOW (ref 4.22–5.81)
RDW: 18.6 % — ABNORMAL HIGH (ref 11.5–15.5)
WBC: 13.2 10*3/uL — ABNORMAL HIGH (ref 4.0–10.5)
nRBC: 0 % (ref 0.0–0.2)

## 2022-08-09 LAB — COMPREHENSIVE METABOLIC PANEL
ALT: 30 U/L (ref 0–44)
AST: 19 U/L (ref 15–41)
Albumin: 2.1 g/dL — ABNORMAL LOW (ref 3.5–5.0)
Alkaline Phosphatase: 96 U/L (ref 38–126)
Anion gap: 9 (ref 5–15)
BUN: 12 mg/dL (ref 8–23)
CO2: 23 mmol/L (ref 22–32)
Calcium: 8 mg/dL — ABNORMAL LOW (ref 8.9–10.3)
Chloride: 102 mmol/L (ref 98–111)
Creatinine, Ser: 1.28 mg/dL — ABNORMAL HIGH (ref 0.61–1.24)
GFR, Estimated: 55 mL/min — ABNORMAL LOW (ref >60)
Glucose, Bld: 211 mg/dL — ABNORMAL HIGH (ref 70–99)
Potassium: 3.5 mmol/L (ref 3.5–5.1)
Sodium: 134 mmol/L — ABNORMAL LOW (ref 135–145)
Total Bilirubin: 0.6 mg/dL (ref 0.3–1.2)
Total Protein: 5.5 g/dL — ABNORMAL LOW (ref 6.5–8.1)

## 2022-08-09 LAB — GLUCOSE, CAPILLARY
Glucose-Capillary: 167 mg/dL — ABNORMAL HIGH (ref 70–99)
Glucose-Capillary: 322 mg/dL — ABNORMAL HIGH (ref 70–99)
Glucose-Capillary: 256 mg/dL — ABNORMAL HIGH (ref 70–99)
Glucose-Capillary: 280 mg/dL — ABNORMAL HIGH (ref 70–99)

## 2022-08-09 LAB — MAGNESIUM: Magnesium: 1.8 mg/dL (ref 1.7–2.4)

## 2022-08-09 LAB — PHOSPHORUS: Phosphorus: 2.1 mg/dL — ABNORMAL LOW (ref 2.5–4.6)

## 2022-08-09 MED ORDER — K PHOS MONO-SOD PHOS DI & MONO 155-852-130 MG PO TABS
500.0000 mg | ORAL_TABLET | Freq: Once | ORAL | Status: AC
  Administered 2022-08-09: 500 mg via ORAL
  Filled 2022-08-09: qty 2

## 2022-08-09 MED ORDER — MAGNESIUM HYDROXIDE 400 MG/5ML PO SUSP: 30.0000 mL | Freq: Every day | ORAL | Status: DC | PRN

## 2022-08-09 MED ORDER — INSULIN ASPART 100 UNIT/ML IJ SOLN
0.0000 [IU] | Freq: Three times a day (TID) | INTRAMUSCULAR | Status: DC
  Administered 2022-08-09: 11 [IU] via SUBCUTANEOUS
  Administered 2022-08-09 (×2): 8 [IU] via SUBCUTANEOUS
  Administered 2022-08-10: 3 [IU] via SUBCUTANEOUS
  Administered 2022-08-10: 8 [IU] via SUBCUTANEOUS
  Administered 2022-08-10: 11 [IU] via SUBCUTANEOUS
  Administered 2022-08-10: 15 [IU] via SUBCUTANEOUS
  Administered 2022-08-11 (×2): 8 [IU] via SUBCUTANEOUS

## 2022-08-09 NOTE — Progress Notes (Signed)
OT Cancellation Note  Patient Details Name: Ryan Garcia MRN: [id number] DOB: 1933/10/30   Cancelled Treatment:    Reason Eval/Treat Not Completed: Other (comment) Currently with lunch tray. Will follow up for OT session as schedule permits.  [name] 08/09/2022, 11:54 AM

## 2022-08-09 NOTE — TOC Progression Note (Addendum)
Transition of Care [facility]) - Progression Note    Patient Details  Name: Ryan Garcia MRN: [id number] Date of Birth: 27-Jan-1934  Transition of Care [facility]) CM/SW [place], RN Phone Number: 08/09/2022, 2:50 PM  Clinical Narrative:     Discussion of follow up for oncology. Notes from oncology  navigator indicate that they will make appointments with patient. Messaged MD for Face to face orders for [facility]  Expected Discharge Plan: [place] Barriers to Discharge: Continued Medical Work up  Expected Discharge Plan and Services   Discharge Planning Services: CM Consult Post Acute Care Choice: Durable Medical Equipment, Home Health Living arrangements for the past 2 months: Single Family Home                 DME Arranged: Bedside commode, Walker rolling DME Agency: [employer] Date DME Agency Contacted: 08/07/22 Time DME Agency Contacted: 1007 Representative spoke with at DME Agency: [place]: PT Tony: [place] Date [place]: 08/07/22 Time [place]: 1008 Representative spoke with at [place]: [place] Determinants of Health ([place]) Interventions SDOH Screenings   Food Insecurity: No Food Insecurity (07/31/2022)  Housing: [place]  (07/31/2022)  Transportation Needs: No Transportation Needs (07/31/2022)  Utilities: Not At Risk (07/31/2022)  Alcohol Screen: Low Risk  (06/29/2021)  Depression (PHQ2-9): Low Risk  (07/05/2022)  Financial Resource Strain: Low Risk  (06/29/2021)  Physical Activity: Inactive (06/29/2021)  Social Connections: Moderately Integrated (06/29/2021)  Stress: No Stress Concern Present (06/29/2021)  Tobacco Use: Low Risk  (08/04/2022)    Readmission Risk Interventions     No data to display

## 2022-08-09 NOTE — Progress Notes (Addendum)
Patient ID: [name], male   DOB: Mar 06, 1934, 89 y.o.   MRN: [id number]   Progress Note from the Palliative Medicine Team at [facility]   Patient Name: Ryan Garcia        Date: 08/09/2022 DOB: 08-17-1933  Age: 89 y.o. MRN#: [id number] Attending Physician: Elodia Florence., * Primary Care Physician: Tower, Wynelle Fanny, MD Admit Date: 07/30/2022   Medical records reviewed, discussed with treatment team   89 y.o. male with medical history significant for CAD s/p PCI, afib on eliquis, CHF, HTN, HLD, OSA, right renal mass who presented to [facility] ED via EMS with chest pain. He was also found to have anemia with concern GI bleed with heme positive stool. He was admitted to the medicine service and GI and cardiology consulted.    He was found to have NSTEMI related to demand ischemia in setting of anemia and eliquis held.    Patient underwent colonoscopy and endoscopy by GI with polyps in proximal ascending, transverse (biopsied), and sigmoid (biopsied) colon and rectum and malignant tumor in mid ascending transverse colon (biopsied). Endoscopy with findings of erythema in duodenum which was biopsied.  Biopsy significant for adenocarcinoma  S/p   lap with right colectomy, .right colectomy    Right upper quadrant palpable fullness and tenderness resolved.   Wife and patient report normal stool last evening     Hemoglobin 8.8 today   This NP visited patient at the bedside  as a follow up for palliative medicine needs and emotional support, wife at bedside    Ryan Garcia is alert and oriented x 3.  He is sitting on the side of the bed and reports feeling better and ready to go home.   Continued education regarding overall medical situation and postop recovery  for this patient.    Plan is for patient to discharge home with his wife when medically stable, likely over the next few days  Discussed importance of OP follow up with oncology.    In discussion with treatment plan patient oncology  follow-up with Dr. Ammie Dalton to 08-24-22 at [facility]  Education offered on the importance of continued mobility, overall nutrition and  the importance of continued conversation with his family and the medical providers regarding overall plan of care and treatment options,  ensuring decisions are within the context of the patients values and GOCs.  Encouraged self-care/respite  to wife, she is the main caregiver  Questions and concerns addressed     34 minutes   Ryan Lessen NP  Palliative Medicine Team Team Phone # [phone] Pager [phone]

## 2022-08-09 NOTE — Plan of Care (Signed)

## 2022-08-09 NOTE — Inpatient Diabetes Management (Signed)
Inpatient Diabetes Program Recommendations  AACE/ADA: New Consensus Statement on Inpatient Glycemic Control (2015)  Target Ranges:  Prepandial:   less than 140 mg/dL      Peak postprandial:   less than 180 mg/dL (1-2 hours)      Critically ill patients:  140 - 180 mg/dL   Lab Results  Component Value Date   GLUCAP 167 (H) 08/09/2022   HGBA1C 7.8 (H) 07/31/2022    Review of Glycemic Control  Latest Reference Range & Units 08/08/22 12:20 08/08/22 16:02 08/08/22 21:04 08/09/22 06:09  Glucose-Capillary 70 - 99 mg/dL 223 (H) 286 (H) 221 (H) 167 (H)  (H): Data is abnormally high Diabetes history: Type 2 DM Outpatient Diabetes medications: Lantus 51 units QHS, Novolog 16 units TID, Metformin 1000 mg BID Current orders for Inpatient glycemic control: Levemir 15 units QD, Novolog 0-15 units TID & HS  Inpatient Diabetes Program Recommendations:    Consider adding Novolog 3 units TID (Assuming patient is consuming >50% of meals).  Thanks, Bronson Curb, MSN, RNC-OB Diabetes Coordinator [phone] (8a-5p)

## 2022-08-09 NOTE — Progress Notes (Signed)
[facility] Progress Note  6 Days Post-Op  Subjective: CC-  Wife at bedside.  No complaints this morning. Still has some intermittent RUQ abdominal pain. Denies n/v. Passing flatus. No further bowel movements after yesterday morning.   Objective: Vital signs in last 24 hours: Temp:  [97.4 F (36.3 C)-99.1 F (37.3 C)] 97.4 F (36.3 C) (02/08 0726) Pulse Rate:  [62-80] 71 (02/08 0228) Resp:  [15-27] 18 (02/08 0600) BP: (142-158)/(54-89) 146/55 (02/08 0726) SpO2:  [93 %-95 %] 95 % (02/08 0228) Weight:  [109.5 kg] 109.5 kg (02/08 0614) Last BM Date : 08/08/22  Intake/Output from previous day: 02/07 0701 - 02/08 0700 In: 540 [P.O.:540] Out: 1 [Stool:1] Intake/Output this shift: No intake/output data recorded.  PE: Gen:  Alert, NAD Abd: soft, protuberant, focally tender in the RUQ with some palpable fullness, midline incision cdi with staples present and no erythema or drainage, port site also cdi with staple present  Lab Results:  Recent Labs    08/08/22 0741 08/08/22 1449 08/09/22 0019  WBC 15.3*  --  13.2*  HGB 10.1* 9.0* 8.8*  HCT 33.2* 29.3* 29.2*  PLT 242  --  206   BMET Recent Labs    08/08/22 0741 08/09/22 0019  NA 138 134*  K 4.2 3.5  CL 106 102  CO2 21* 23  GLUCOSE 208* 211*  BUN 10 12  CREATININE 1.32* 1.28*  CALCIUM 8.4* 8.0*   PT/INR Recent Labs    08/08/22 0741  LABPROT 19.4*  INR 1.7*   CMP     Component Value Date/Time   NA 134 (L) 08/09/2022 0019   NA 144 12/06/2021 1344   K 3.5 08/09/2022 0019   CL 102 08/09/2022 0019   CO2 23 08/09/2022 0019   GLUCOSE 211 (H) 08/09/2022 0019   BUN 12 08/09/2022 0019   BUN 22 12/06/2021 1344   CREATININE 1.28 (H) 08/09/2022 0019   CALCIUM 8.0 (L) 08/09/2022 0019   PROT 5.5 (L) 08/09/2022 0019   PROT 6.5 09/15/2019 0810   ALBUMIN 2.1 (L) 08/09/2022 0019   ALBUMIN 4.1 09/15/2019 0810   AST 19 08/09/2022 0019   ALT 30 08/09/2022 0019   ALKPHOS 96 08/09/2022 0019   BILITOT 0.6  08/09/2022 0019   BILITOT 0.3 09/15/2019 0810   GFRNONAA 55 (L) 08/09/2022 0019   GFRAA >60 07/03/2018 0304   Lipase     Component Value Date/Time   LIPASE 28 04/16/2020 0111       Studies/Results: CT ABDOMEN PELVIS W CONTRAST  Result Date: 08/08/2022 CLINICAL DATA:  Status post laparoscopic colectomy on February 2nd for colon cancer. Abdominal pain. * Tracking Code: BO * EXAM: CT ABDOMEN AND PELVIS WITH CONTRAST TECHNIQUE: Multidetector CT imaging of the abdomen and pelvis was performed using the standard protocol following bolus administration of intravenous contrast. RADIATION DOSE REDUCTION: This exam was performed according to the departmental dose-optimization program which includes automated exposure control, adjustment of the mA and/or kV according to patient size and/or use of iterative reconstruction technique. CONTRAST:  43m OMNIPAQUE IOHEXOL 350 MG/ML SOLN COMPARISON:  07/31/2022 chest abdomen and pelvic CTs. FINDINGS: Lower chest: Right greater than left base dependent compressive atelectasis. Right lower lobe calcified granuloma. Mild cardiomegaly with multivessel coronary artery calcification. Tiny bilateral pleural effusions. Hepatobiliary: Normal liver. Gallstones up to 6 mm without acute cholecystitis or biliary duct dilatation. Pancreas: Normal, without mass or ductal dilatation. Spleen: Normal in size, without focal abnormality. Adrenals/Urinary Tract: Normal adrenal glands. Bilateral renal cysts including  up to 12.1 cm. Upper pole right renal 2.5 cm lesion has been evaluated previously on dedicated imaging (11/8 today). No hydronephrosis. Normal urinary bladder. Stomach/Bowel: Normal stomach, without wall thickening. Descending duodenal diverticulum is tiny. Otherwise normal small bowel. Scattered colonic diverticula. Status post partial right hemicolectomy. Vascular/Lymphatic: Aortic atherosclerosis. No abdominopelvic adenopathy. Reproductive: Mild prostatomegaly. Other: Trace  fluid within the pelvic cul-de-sac is new. There is also ill-defined small volume right perihepatic fluid, just lateral to the surgical sutures. Example at 4.9 x 1.4 cm on 41/3. 6.2 cm craniocaudal on 58/6. No surrounding enhancement or gas within. No intra-abdominal/pelvic extraluminal gas. There is trace gas within the anterior abdominal wall which is presumably postoperative in the setting of midline laparotomy staples. Musculoskeletal: No acute osseous abnormality. IMPRESSION: 1. Status post partial right hemicolectomy, without acute postoperative complication. 2. Small volume right perihepatic fluid, just lateral to the surgical site. Likely an area of loculated ascites. No abnormal enhancement or gas within to suggest abscess. 3. Tiny bilateral pleural effusions and bibasilar atelectasis. Trace cul-de-sac fluid. 4. Cholelithiasis 5. Upper pole right renal lesion has been detailed on dedicated imaging as suspicious for renal cell carcinoma. 6. Coronary artery atherosclerosis. Aortic Atherosclerosis (ICD10-I70.0). Electronically Signed   By: Abigail Miyamoto M.D.   On: 08/08/2022 12:02   VAS [place] LOWER EXTREMITY VENOUS (DVT)  Result Date: 08/08/2022  Lower Venous DVT Study Patient Name:  Ryan Garcia  Date of Exam:   08/08/2022 Medical Rec #: [id number]      Accession #:    [id number] Date of Birth: 1934-03-02      Patient Gender: M Patient Age:   89 years Exam Location:  [facility] Procedure:      VAS [place] LOWER EXTREMITY VENOUS (DVT) Referring Phys: A POWELL JR --------------------------------------------------------------------------------  Indications: Swelling, and Edema. Other Indications: S/P partial colectomy. Performing Technologist: Oda Cogan RDMS, RVT  Examination Guidelines: A complete evaluation includes B-mode imaging, spectral Doppler, color Doppler, and power Doppler as needed of all accessible portions of each vessel. Bilateral testing is considered an integral part of a complete  examination. Limited examinations for reoccurring indications may be performed as noted. The reflux portion of the exam is performed with the patient in reverse Trendelenburg.  +---------+---------------+---------+-----------+----------+--------------+ RIGHT    CompressibilityPhasicitySpontaneityPropertiesThrombus Aging +---------+---------------+---------+-----------+----------+--------------+ CFV      Full           Yes      Yes                                 +---------+---------------+---------+-----------+----------+--------------+ SFJ      Full                                                        +---------+---------------+---------+-----------+----------+--------------+ FV Prox  Full                                                        +---------+---------------+---------+-----------+----------+--------------+ FV Mid   Full                                                        +---------+---------------+---------+-----------+----------+--------------+  FV DistalFull                                                        +---------+---------------+---------+-----------+----------+--------------+ PFV      Full                                                        +---------+---------------+---------+-----------+----------+--------------+ POP      Full           Yes      Yes                                 +---------+---------------+---------+-----------+----------+--------------+ PTV      Full                                                        +---------+---------------+---------+-----------+----------+--------------+ PERO     Full                                                        +---------+---------------+---------+-----------+----------+--------------+   +---------+---------------+---------+-----------+----------+--------------+ LEFT     CompressibilityPhasicitySpontaneityPropertiesThrombus Aging  +---------+---------------+---------+-----------+----------+--------------+ CFV      Full           Yes      Yes                                 +---------+---------------+---------+-----------+----------+--------------+ SFJ      Full                                                        +---------+---------------+---------+-----------+----------+--------------+ FV Prox  Full                                                        +---------+---------------+---------+-----------+----------+--------------+ FV Mid   Full                                                        +---------+---------------+---------+-----------+----------+--------------+ FV DistalFull                                                        +---------+---------------+---------+-----------+----------+--------------+   PFV      Full                                                        +---------+---------------+---------+-----------+----------+--------------+ POP      Full           Yes      Yes                                 +---------+---------------+---------+-----------+----------+--------------+ PTV      Full                                                        +---------+---------------+---------+-----------+----------+--------------+ PERO     Full                                                        +---------+---------------+---------+-----------+----------+--------------+    Summary: BILATERAL: - No evidence of deep vein thrombosis seen in the lower extremities, bilaterally. -No evidence of popliteal cyst, bilaterally.   *See table(s) above for measurements and observations.    Preliminary    DG CHEST PORT 1 VIEW  Result Date: 08/08/2022 CLINICAL DATA:  Chest pain. EXAM: PORTABLE CHEST 1 VIEW COMPARISON:  CT chest 07/31/2022 and chest radiograph 07/30/2022. FINDINGS: Trachea is midline. Heart is enlarged, stable. Lungs are somewhat low in volume with mild bibasilar  interstitial prominence and indistinctness. Tiny bilateral pleural effusions. IMPRESSION: Tiny bilateral effusions with suspected mild pulmonary edema. Electronically Signed   By: Lorin Picket M.D.   On: 08/08/2022 09:11    Anti-infectives: Anti-infectives (From admission, onward)    Start     Dose/Rate Route Frequency Ordered Stop   08/03/22 0600  cefoTEtan (CEFOTAN) 2 g in sodium chloride 0.9 % 100 mL IVPB        2 g 200 mL/hr over 30 Minutes Intravenous On call to O.R. 08/02/22 1527 08/03/22 1030        Assessment/Plan POD 6, s/p lap assisted right colectomy by Dr. Marlou Starks 08/03/22 for Ascending colon mass concerning for malignancy - surgical path: Invasive moderately differentiated adenocarcinoma with extracellular mucin 6 cm, Carcinoma very focally invades into the pericolonic soft tissue, Metastatic carcinoma to one of eighteen lymph nodes (1/18), one tumor  Deposit. Will need oncology follow up. - CEA 6.1. CT chest abdomen pelvis done without findings of metastatic disease.  - CT 2/7 without acute postop complication, he does appear to have a lot of retained stool in his colon - Increased bowel regimen to miralax and colace BID. Continue CM diet as tolerated. Mobilize. - Continue therapies - recommending home health PT   FEN: CM diet ID: none VTE: holding eliquis given blood in stool (likely oozing from staple line)   --Per primary-- NSTEMI - present on admission, likely secondary to demand ischemia. Cardiology following CAD s/p PCI afib on eliquis CHF HTN HLD OSA right renal mass DNR/DNI    LOS: 9 days  Wellington Hampshire, [facility] Surgery 08/09/2022, 8:43 AM Please see Amion for pager number during day hours 7:00am-4:30pm

## 2022-08-09 NOTE — Progress Notes (Signed)
PROGRESS NOTE    Ryan Garcia  UPJ:[id number] DOB: 11-04-1933 DOA: 07/30/2022 PCP: Abner Greenspan, MD  Chief Complaint  Patient presents with   Chest Pain    Brief Narrative:  89 y.o. male with medical history significant for triple-vessel coronary artery disease status post PCI with stent placement, paroxysmal Dietrich Ke-fib on Eliquis, chronic diastolic CHF, hypertension, hyperlipidemia, OSA on CPAP, right renal mass diagnosed 2 years ago, followed by urology presented with exertional chest pain.  On presentation, EKG did not show any evidence of acute ischemia but troponins were 93 and subsequently 425. Hemoglobin was 8.7 (from baseline of 13.1).  Eliquis was held.  He was started on IV Protonix. CT chest abdomen and pelvis with contrast showed no evidence of active bleeding however showed 2.5 x 2.6 cm enhancing lesion in the anterior right upper kidney grossly unchanged from prior MRI compatible with solid renal neoplasm such as renal cell carcinoma.  Cardiology and GI were consulted.  Palliative care was also consulted for goals of care discussion.    1/31: Underwent EGD that was normal biopsy obtained,Colonoscopy showed Malignant tumor in the mid descending colon biopsied, had polyps in transverse colon and sigmoid colon,CEA  and surgery consult obtained.  Patient had CT chest abdomen pelvis showing renal lesion but without suspicion for metastatic disease.  Palliative care consulted> patient interested to pursue surgical option.  Patient underwent laparoscopic colectomy 2/2.  Also followed by cardiology for non-STEMI.   Waiting to have Domnic Vantol bowel movement for discharge home.   Assessment & Plan:   Principal Problem:   NSTEMI (non-ST elevated myocardial infarction) [facility]) Active Problems:   Ascending colon malignant neoplasm ([place])   Benign neoplasm of transverse colon   Benign neoplasm of sigmoid colon   Benign neoplasm of rectum   Duodenitis  BRBPR Eliquis on hold, will continue to  monitor Likely related to recent surgery - "oozing from staple line" Consider resumption 2/9?  Will see how he does and if recurrent blood.   Systemic Inflammatory Response Syndrome  Right Upper Quadrant Pain Elevated WBC, fever (both improved today) CT abd/pelvis per surgery -> without any obvious infectious source, see report below, did have area of loculated ascites, but no need for abx per discussion with surgery Will get CXR with tiny effusions and mild pulmonary edema and UA without evidence of UTI Blood cultures pending  Malignant right colonic mass: S/p egd/colonoscopy 1/31 Colonoscopy with malignant tumor in the mid descending colon biopsied, had polyps in transverse colon and sigmoid colon CT chest abdomen pelvis showing renal lesion but without suspicion for metastatic disease.  CEA 6.1. S/p OR lap assisted rt colectomy 08/03/22.  Surg path 08/03/22 with invasive moderately differentiated adenocarcinoma with extracellular mucin, carcinoma very focally invades into the pericolonic soft tissue, resection margins negative for carcinoma, metastatic carcinoma to one of eighteen LN's, one tumor deposit (see report) CT abd/pelvis for RUQ pain -> small volume R perihepatic fluid lateral to surgical site (area of loculated ascites).  Tiny bilateral effusions and bibasilar atelectasis.  Per discussion with surgery, appears to have retained stool in colon.  Bowel regimen adjusted.  Diet per surgery  Holding eliquis for now as above Appreciate further surgery recommendations   NSTEMI type II/demand ischemia: Last cardiology note appears to be 08/03/2022.   Trop peaked at 651 Suspected demand ischemia in setting of anemia.  Known CAD on prior cath.  No plan for invasive workup.  Keep off anticoagulation.  Continue metop, statin, imdur.   Echo  1/30 with EF 60-65%, no RWMA, mildly elevated PASP   Iron deficiency anemia: Likely due to colon cancer.  Receiving iron via IV.  Will continue close  monitoring.  Holding eliquis at this time.     Acute on Chronic diastolic CHF: mild exacerbation with CXR with "tiny bilateral effusions with suspected mild pulmonary edema" 2/7 Has R>L LE swelling today On 20 mg lasix daily at home -> resumed Continue metoprolol and imdur Strict I/O, daily weights   Acute kidney injury  Creatinine fluctuating, baseline 1.1.  Peaked at 1.49.  Trend.   Paroxysmal atrial fibrillation: Eliquis on hold given above.  Rate controlled on metoprolol.   Hypertension: BP stable, continue Imdur and metoprolol.   HLD continue statin   Right renal mass concerning for renal cell carcinoma follow-up with urology as outpatient   OSA continue CPAP nightly   Type 2 diabetes mellitus: A1c 7.8 blood sugar stable on SSI Blood sugars are persistently more than 150.  Patient is on insulin, glipizide at home.  Currently remains on sliding scale insulin. Currently on levemir 15 units daily.  Follow and adjust prn.    Physical deconditioning continue PT OT, recommended home after clinically stable.     Goals of care palliative care following currently DNR.  Desires all clinical treatment.  Hard of hearing    DVT prophylaxis: eliquis - holding today Code Status: dnr/dni Family Communication: wife at bedside  Disposition:   Status is: Inpatient Remains inpatient appropriate because: continued need for inpt care    Consultants:  Palliative care   Procedures:  Echo IMPRESSIONS     1. Left ventricular ejection fraction, by estimation, is 60 to 65%. The  left ventricle has normal function. The left ventricle has no regional  wall motion abnormalities. There is mild concentric left ventricular  hypertrophy. Left ventricular diastolic  function could not be evaluated.   2. Right ventricular systolic function is normal. The right ventricular  size is normal. There is mildly elevated pulmonary artery systolic  pressure. The estimated right ventricular systolic  pressure is 85.0 mmHg.   3. The mitral valve is grossly normal. No evidence of mitral valve  regurgitation. No evidence of mitral stenosis.   4. The aortic valve is tricuspid. There is mild calcification of the  aortic valve. There is mild thickening of the aortic valve. Aortic valve  regurgitation is not visualized. Aortic valve sclerosis/calcification is  present, without any evidence of  aortic stenosis.   5. The inferior vena cava is normal in size with greater than 50%  respiratory variability, suggesting right atrial pressure of 3 mmHg.   Comparison(s): No significant change from prior study.   2/2 LAPAROSCOPIC ASSISTED RIGHT COLECTOMY   Antimicrobials:  Anti-infectives (From admission, onward)    Start     Dose/Rate Route Frequency Ordered Stop   08/03/22 0600  cefoTEtan (CEFOTAN) 2 g in sodium chloride 0.9 % 100 mL IVPB        2 g 200 mL/hr over 30 Minutes Intravenous On call to O.R. 08/02/22 1527 08/03/22 1030       Subjective: No complaints Hasn't had BM since yesterday Wife at bedside  Objective: Vitals:   08/09/22 0500 08/09/22 0600 08/09/22 0614 08/09/22 0726  BP:    (!) 146/55  Pulse:      Resp: 15 18    Temp:    (!) 97.4 F (36.3 C)  TempSrc:    Oral  SpO2:      Weight:  109.5 kg   Height:        Intake/Output Summary (Last 24 hours) at 08/09/2022 0858 Last data filed at 08/09/2022 0615 Gross per 24 hour  Intake 540 ml  Output 1 ml  Net 539 ml   Filed Weights   08/07/22 0300 08/08/22 0624 08/09/22 0614  Weight: 112.2 kg 111.4 kg 109.5 kg    Examination:  General: No acute distress. Cardiovascular: irregular Lungs: unlabored, diminished, no adventitious lung sounds - wearing nasal CPAP Abdomen: staples in place Neurological: Alert and oriented 3.  Hard of hearing. Moves all extremities 4 with equal strength. Cranial nerves II through XII grossly intact. Extremities: RLE>LLE edema   Data Reviewed: I have personally reviewed following  labs and imaging studies  CBC: Recent Labs  Lab 08/05/22 0030 08/06/22 0026 08/07/22 0026 08/08/22 0741 08/08/22 1449 08/09/22 0019  WBC 10.0 7.9 8.2 15.3*  --  13.2*  NEUTROABS 8.2* 6.3 6.2  --   --  10.3*  HGB 8.1* 8.0* 8.1* 10.1* 9.0* 8.8*  HCT 27.4* 25.6* 25.9* 33.2* 29.3* 29.2*  MCV 84.0 82.3 81.7 82.2  --  83.9  PLT 194 175 186 242  --  546    Basic Metabolic Panel: Recent Labs  Lab 08/03/22 0044 08/04/22 0019 08/05/22 0030 08/06/22 0026 08/07/22 0026 08/08/22 0741 08/09/22 0019  NA 138   < > 139 138 137 138 134*  K 4.0   < > 3.7 3.6 3.3* 4.2 3.5  CL 110   < > 112* 110 109 106 102  CO2 21*   < > 21* 21* 22 21* 23  GLUCOSE 196*   < > 164* 243* 211* 208* 211*  BUN 12   < > '16 18 11 10 12  '$ CREATININE 1.23   < > 1.25* 1.41* 1.26* 1.32* 1.28*  CALCIUM 8.3*   < > 8.3* 8.0* 8.0* 8.4* 8.0*  MG 1.9  --  1.9  --   --  1.8 1.8  PHOS  --   --   --   --   --   --  2.1*   < > = values in this interval not displayed.    GFR: Estimated Creatinine Clearance: 52.1 mL/min (Daleisa Halperin) (by C-G formula based on SCr of 1.28 mg/dL (H)).  Liver Function Tests: Recent Labs  Lab 08/05/22 0030 08/09/22 0019  AST 13* 19  ALT 13 30  ALKPHOS 67 96  BILITOT 0.8 0.6  PROT 5.7* 5.5*  ALBUMIN 2.6* 2.1*    CBG: Recent Labs  Lab 08/08/22 0608 08/08/22 1220 08/08/22 1602 08/08/22 2104 08/09/22 0609  GLUCAP 182* 223* 286* 221* 167*     Recent Results (from the past 240 hour(s))  MRSA Next Gen by PCR, Nasal     Status: None   Collection Time: 07/31/22  4:30 PM   Specimen: Nasal Mucosa; Nasal Swab  Result Value Ref Range Status   MRSA by PCR Next Gen NOT DETECTED NOT DETECTED Final    Comment: (NOTE) The GeneXpert MRSA Assay (FDA approved for NASAL specimens only), is one component of Saje Gallop comprehensive MRSA colonization surveillance program. It is not intended to diagnose MRSA infection nor to guide or monitor treatment for MRSA infections. Test performance is not FDA approved in  patients less than 34 years old. Performed at [facility], [place] [address], [place], [place] [address]   Culture, blood (Routine X 2) w Reflex to ID Panel     Status: None (Preliminary result)  Collection Time: 08/08/22  2:49 PM   Specimen: BLOOD  Result Value Ref Range Status   Specimen Description BLOOD RIGHT ANTECUBITAL  Final   Special Requests   Final    BOTTLES DRAWN AEROBIC AND ANAEROBIC Blood Culture adequate volume   Culture   Final    NO GROWTH < 24 HOURS Performed at [facility], [address] [place], [place] [address]    Report Status PENDING  Incomplete  Culture, blood (Routine X 2) w Reflex to ID Panel     Status: None (Preliminary result)   Collection Time: 08/08/22  2:50 PM   Specimen: BLOOD  Result Value Ref Range Status   Specimen Description BLOOD RIGHT ANTECUBITAL  Final   Special Requests   Final    BOTTLES DRAWN AEROBIC AND ANAEROBIC Blood Culture adequate volume   Culture   Final    NO GROWTH < 24 HOURS Performed at [facility], [place] [address] [place], [place] [address]    Report Status PENDING  Incomplete         Radiology Studies: CT ABDOMEN PELVIS W CONTRAST  Result Date: 08/08/2022 CLINICAL DATA:  Status post laparoscopic colectomy on February 2nd for colon cancer. Abdominal pain. * Tracking Code: BO * EXAM: CT ABDOMEN AND PELVIS WITH CONTRAST TECHNIQUE: Multidetector CT imaging of the abdomen and pelvis was performed using the standard protocol following bolus administration of intravenous contrast. RADIATION DOSE REDUCTION: This exam was performed according to the departmental dose-optimization program which includes automated exposure control, adjustment of the mA and/or kV according to patient size and/or use of iterative reconstruction technique. CONTRAST:  10m OMNIPAQUE IOHEXOL 350 MG/ML SOLN COMPARISON:  07/31/2022 chest abdomen and pelvic CTs. FINDINGS: Lower chest: Right greater than left base dependent compressive  atelectasis. Right lower lobe calcified granuloma. Mild cardiomegaly with multivessel coronary artery calcification. Tiny bilateral pleural effusions. Hepatobiliary: Normal liver. Gallstones up to 6 mm without acute cholecystitis or biliary duct dilatation. Pancreas: Normal, without mass or ductal dilatation. Spleen: Normal in size, without focal abnormality. Adrenals/Urinary Tract: Normal adrenal glands. Bilateral renal cysts including up to 12.1 cm. Upper pole right renal 2.5 cm lesion has been evaluated previously on dedicated imaging (11/8 today). No hydronephrosis. Normal urinary bladder. Stomach/Bowel: Normal stomach, without wall thickening. Descending duodenal diverticulum is tiny. Otherwise normal small bowel. Scattered colonic diverticula. Status post partial right hemicolectomy. Vascular/Lymphatic: Aortic atherosclerosis. No abdominopelvic adenopathy. Reproductive: Mild prostatomegaly. Other: Trace fluid within the pelvic cul-de-sac is new. There is also ill-defined small volume right perihepatic fluid, just lateral to the surgical sutures. Example at 4.9 x 1.4 cm on 41/3. 6.2 cm craniocaudal on 58/6. No surrounding enhancement or gas within. No intra-abdominal/pelvic extraluminal gas. There is trace gas within the anterior abdominal wall which is presumably postoperative in the setting of midline laparotomy staples. Musculoskeletal: No acute osseous abnormality. IMPRESSION: 1. Status post partial right hemicolectomy, without acute postoperative complication. 2. Small volume right perihepatic fluid, just lateral to the surgical site. Likely an area of loculated ascites. No abnormal enhancement or gas within to suggest abscess. 3. Tiny bilateral pleural effusions and bibasilar atelectasis. Trace cul-de-sac fluid. 4. Cholelithiasis 5. Upper pole right renal lesion has been detailed on dedicated imaging as suspicious for renal cell carcinoma. 6. Coronary artery atherosclerosis. Aortic Atherosclerosis  (ICD10-I70.0). Electronically Signed   By: KAbigail MiyamotoM.D.   On: 08/08/2022 12:02   VAS [place]OWER EXTREMITY VENOUS (DVT)  Result Date: 08/08/2022  Lower Venous DVT Study Patient  Name:  Ryan Garcia  Date of Exam:   08/08/2022 Medical Rec #: [id number]      Accession #:    [id number] Date of Birth: [birth year]      Patient Gender: M Patient Age:   57 years Exam Location:  [facility] Procedure:      VAS [place] LOWER EXTREMITY VENOUS (DVT) Referring Phys: Hooria Gasparini POWELL JR --------------------------------------------------------------------------------  Indications: Swelling, and Edema. Other Indications: S/P partial colectomy. Performing Technologist: Oda Cogan RDMS, RVT  Examination Guidelines: Akacia Boltz complete evaluation includes B-mode imaging, spectral Doppler, color Doppler, and power Doppler as needed of all accessible portions of each vessel. Bilateral testing is considered an integral part of Zylpha Poynor complete examination. Limited examinations for reoccurring indications may be performed as noted. The reflux portion of the exam is performed with the patient in reverse Trendelenburg.  +---------+---------------+---------+-----------+----------+--------------+ RIGHT    CompressibilityPhasicitySpontaneityPropertiesThrombus Aging +---------+---------------+---------+-----------+----------+--------------+ CFV      Full           Yes      Yes                                 +---------+---------------+---------+-----------+----------+--------------+ SFJ      Full                                                        +---------+---------------+---------+-----------+----------+--------------+ FV Prox  Full                                                        +---------+---------------+---------+-----------+----------+--------------+ FV Mid   Full                                                        +---------+---------------+---------+-----------+----------+--------------+ FV  DistalFull                                                        +---------+---------------+---------+-----------+----------+--------------+ PFV      Full                                                        +---------+---------------+---------+-----------+----------+--------------+ POP      Full           Yes      Yes                                 +---------+---------------+---------+-----------+----------+--------------+ PTV      Full                                                        +---------+---------------+---------+-----------+----------+--------------+  PERO     Full                                                        +---------+---------------+---------+-----------+----------+--------------+   +---------+---------------+---------+-----------+----------+--------------+ LEFT     CompressibilityPhasicitySpontaneityPropertiesThrombus Aging +---------+---------------+---------+-----------+----------+--------------+ CFV      Full           Yes      Yes                                 +---------+---------------+---------+-----------+----------+--------------+ SFJ      Full                                                        +---------+---------------+---------+-----------+----------+--------------+ FV Prox  Full                                                        +---------+---------------+---------+-----------+----------+--------------+ FV Mid   Full                                                        +---------+---------------+---------+-----------+----------+--------------+ FV DistalFull                                                        +---------+---------------+---------+-----------+----------+--------------+ PFV      Full                                                        +---------+---------------+---------+-----------+----------+--------------+ POP      Full           Yes      Yes                                  +---------+---------------+---------+-----------+----------+--------------+ PTV      Full                                                        +---------+---------------+---------+-----------+----------+--------------+ PERO     Full                                                        +---------+---------------+---------+-----------+----------+--------------+  Summary: BILATERAL: - No evidence of deep vein thrombosis seen in the lower extremities, bilaterally. -No evidence of popliteal cyst, bilaterally.   *See table(s) above for measurements and observations.    Preliminary    DG CHEST PORT 1 VIEW  Result Date: 08/08/2022 CLINICAL DATA:  Chest pain. EXAM: PORTABLE CHEST 1 VIEW COMPARISON:  CT chest 07/31/2022 and chest radiograph 07/30/2022. FINDINGS: Trachea is midline. Heart is enlarged, stable. Lungs are somewhat low in volume with mild bibasilar interstitial prominence and indistinctness. Tiny bilateral pleural effusions. IMPRESSION: Tiny bilateral effusions with suspected mild pulmonary edema. Electronically Signed   By: Lorin Picket M.D.   On: 08/08/2022 09:11        Scheduled Meds:  acetaminophen  1,000 mg Oral Q6H   docusate sodium  100 mg Oral BID   feeding supplement (GLUCERNA SHAKE)  237 mL Oral BID BM   furosemide  20 mg Oral Daily   insulin aspart  0-15 Units Subcutaneous TID AC & HS   insulin detemir  15 Units Subcutaneous Daily   isosorbide mononitrate  30 mg Oral Daily   metoprolol tartrate  12.5 mg Oral BID   pantoprazole  40 mg Oral Q0600   polyethylene glycol  17 g Oral BID   rosuvastatin  40 mg Oral Daily   Continuous Infusions:     LOS: 9 days    Time spent: over 30 min    Fayrene Helper, MD [facility]   To contact the attending provider between 7A-7P or the covering provider during after hours 7P-7A, please log into the web site [email or web address] and access using universal [place] password for that web  site. If you do not have the password, please call the hospital operator.  08/09/2022, 8:58 AM

## 2022-08-09 NOTE — Progress Notes (Signed)
Mobility Specialist Progress Note:   08/09/22 1055  Mobility  Activity Ambulated with assistance in hallway  Level of Assistance Contact guard assist, steadying assist  Assistive Device None  Distance Ambulated (ft) 300 ft  Activity Response Tolerated well  $Mobility charge 1 Mobility   During Mobility:133 HR Post Mobility:  84 HR  Pt in bed willing to participate in mobility. No complaints of pain. Left in bed with call bell in reach and all needs met.   Gareth Eagle Lillionna Nabi Mobility Specialist Please contact via [employer] or  Rehab Office at [phone]

## 2022-08-10 DIAGNOSIS — I214 Non-ST elevation (NSTEMI) myocardial infarction: Secondary | ICD-10-CM | POA: Diagnosis not present

## 2022-08-10 LAB — BASIC METABOLIC PANEL
Anion gap: 13 (ref 5–15)
BUN: 13 mg/dL (ref 8–23)
CO2: 24 mmol/L (ref 22–32)
Calcium: 8.1 mg/dL — ABNORMAL LOW (ref 8.9–10.3)
Chloride: 98 mmol/L (ref 98–111)
Creatinine, Ser: 1.14 mg/dL (ref 0.61–1.24)
GFR, Estimated: >60 mL/min (ref >60)
Glucose, Bld: 181 mg/dL — ABNORMAL HIGH (ref 70–99)
Potassium: 3.5 mmol/L (ref 3.5–5.1)
Sodium: 135 mmol/L (ref 135–145)

## 2022-08-10 LAB — CBC
HCT: 29.5 % — ABNORMAL LOW (ref 39.0–52.0)
Hemoglobin: 8.8 g/dL — ABNORMAL LOW (ref 13.0–17.0)
MCH: 24.9 pg — ABNORMAL LOW (ref 26.0–34.0)
MCHC: 29.8 g/dL — ABNORMAL LOW (ref 30.0–36.0)
MCV: 83.6 fL (ref 80.0–100.0)
Platelets: 195 10*3/uL (ref 150–400)
RBC: 3.53 MIL/uL — ABNORMAL LOW (ref 4.22–5.81)
RDW: 18.8 % — ABNORMAL HIGH (ref 11.5–15.5)
WBC: 10.4 10*3/uL (ref 4.0–10.5)
nRBC: 0 % (ref 0.0–0.2)

## 2022-08-10 LAB — GLUCOSE, CAPILLARY
Glucose-Capillary: 184 mg/dL — ABNORMAL HIGH (ref 70–99)
Glucose-Capillary: 367 mg/dL — ABNORMAL HIGH (ref 70–99)
Glucose-Capillary: 324 mg/dL — ABNORMAL HIGH (ref 70–99)
Glucose-Capillary: 255 mg/dL — ABNORMAL HIGH (ref 70–99)

## 2022-08-10 LAB — MAGNESIUM: Magnesium: 2 mg/dL (ref 1.7–2.4)

## 2022-08-10 LAB — PHOSPHORUS: Phosphorus: 2.9 mg/dL (ref 2.5–4.6)

## 2022-08-10 MED ORDER — INSULIN ASPART 100 UNIT/ML IJ SOLN
3.0000 [IU] | Freq: Three times a day (TID) | INTRAMUSCULAR | Status: DC
  Administered 2022-08-10 – 2022-08-11 (×3): 3 [IU] via SUBCUTANEOUS

## 2022-08-10 MED ORDER — MAGNESIUM HYDROXIDE 400 MG/5ML PO SUSP
30.0000 mL | Freq: Once | ORAL | Status: AC
  Administered 2022-08-10: 30 mL via ORAL
  Filled 2022-08-10: qty 30

## 2022-08-10 MED ORDER — APIXABAN 5 MG PO TABS
5.0000 mg | ORAL_TABLET | Freq: Two times a day (BID) | ORAL | Status: DC
  Administered 2022-08-10 – 2022-08-11 (×2): 5 mg via ORAL
  Filled 2022-08-10 (×2): qty 1

## 2022-08-10 NOTE — Progress Notes (Signed)
PROGRESS NOTE    Ryan Garcia  [id number] DOB: 1933-08-16 DOA: 07/30/2022 PCP: Abner Greenspan, MD  Chief Complaint  Patient presents with   Chest Pain    Brief Narrative:  89 y.o. male with medical history significant for triple-vessel coronary artery disease status post PCI with stent placement, paroxysmal Adea Geisel-fib on Eliquis, chronic diastolic CHF, hypertension, hyperlipidemia, OSA on CPAP, right renal mass diagnosed 2 years ago, followed by urology presented with exertional chest pain.  On presentation, EKG did not show any evidence of acute ischemia but troponins were 93 and subsequently 425. Hemoglobin was 8.7 (from baseline of 13.1).  Eliquis was held.  He was started on IV Protonix. CT chest abdomen and pelvis with contrast showed no evidence of active bleeding however showed 2.5 x 2.6 cm enhancing lesion in the anterior right upper kidney grossly unchanged from prior MRI compatible with solid renal neoplasm such as renal cell carcinoma.  Cardiology and GI were consulted.  Palliative care was also consulted for goals of care discussion.    1/31: Underwent EGD that was normal biopsy obtained,Colonoscopy showed Malignant tumor in the mid descending colon biopsied, had polyps in transverse colon and sigmoid colon,CEA  and surgery consult obtained.  Patient had CT chest abdomen pelvis showing renal lesion but without suspicion for metastatic disease.  Palliative care consulted> patient interested to pursue surgical option.  Patient underwent laparoscopic colectomy 2/2.  Also followed by cardiology for non-STEMI.   Waiting to have Imani Fiebelkorn bowel movement for discharge home.   Assessment & Plan:   Principal Problem:   NSTEMI (non-ST elevated myocardial infarction) [facility]) Active Problems:   Ascending colon malignant neoplasm ([place])   Benign neoplasm of transverse colon   Benign neoplasm of sigmoid colon   Benign neoplasm of rectum   Duodenitis  BRBPR Eliquis on hold, will continue to  monitor Likely related to recent surgery - "oozing from staple line" Consider resumption 2/9?  No recurrent bleeding.  Will discuss resumption with surgery.  Systemic Inflammatory Response Syndrome  Right Upper Quadrant Pain Elevated WBC, fever (both improved today) CT abd/pelvis per surgery -> without any obvious infectious source, see report below, did have area of loculated ascites, but no need for abx per discussion with surgery Will get CXR with tiny effusions and mild pulmonary edema and UA without evidence of UTI Blood cultures NGTD  Malignant right colonic mass: S/p egd/colonoscopy 1/31 Colonoscopy with malignant tumor in the mid descending colon biopsied, had polyps in transverse colon and sigmoid colon CT chest abdomen pelvis showing renal lesion but without suspicion for metastatic disease.  CEA 6.1. S/p OR lap assisted rt colectomy 08/03/22.  Surg path 08/03/22 with invasive moderately differentiated adenocarcinoma with extracellular mucin, carcinoma very focally invades into the pericolonic soft tissue, resection margins negative for carcinoma, metastatic carcinoma to one of eighteen LN's, one tumor deposit (see report) CT abd/pelvis for RUQ pain -> small volume R perihepatic fluid lateral to surgical site (area of loculated ascites).  Tiny bilateral effusions and bibasilar atelectasis.  Per discussion with surgery, appears to have retained stool in colon.  Bowel regimen adjusted.  Persistent RUQ pain today, will discuss with surgery. Diet per surgery  Holding eliquis for now as above Appreciate further surgery recommendations   NSTEMI type II/demand ischemia: Last cardiology note appears to be 08/03/2022.   Trop peaked at 651 Suspected demand ischemia in setting of anemia.  Known CAD on prior cath.  No plan for invasive workup.  Keep off anticoagulation.  Continue metop, statin, imdur.   Echo 1/30 with EF 60-65%, no RWMA, mildly elevated PASP   Iron deficiency anemia: Likely due to  colon cancer.  Receiving iron via IV.  Will continue close monitoring.  Holding eliquis at this time.     Acute on Chronic diastolic CHF: mild exacerbation with CXR with "tiny bilateral effusions with suspected mild pulmonary edema" 2/7 Has R>L LE swelling today On 20 mg lasix daily at home -> resumed, LE edema is improved Continue metoprolol and imdur Strict I/O, daily weights   Acute kidney injury  Creatinine fluctuating, baseline 1.1.  Peaked at 1.49.  at baseline.   Paroxysmal atrial fibrillation: Eliquis on hold given above.  Rate controlled on metoprolol.   Hypertension: BP stable, continue Imdur and metoprolol.   HLD continue statin   Right renal mass concerning for renal cell carcinoma follow-up with urology as outpatient   OSA continue CPAP nightly   Type 2 diabetes mellitus: A1c 7.8 blood sugar stable on SSI Blood sugars are persistently more than 150.  Patient is on insulin, glipizide at home.  Currently remains on sliding scale insulin. Currently on levemir 15 units daily.  Add mealtime, follow.  Follow and adjust prn.    Physical deconditioning continue PT OT, recommended home after clinically stable.   Goals of care palliative care following currently DNR.  Desires all clinical treatment.  Hard of hearing    DVT prophylaxis: eliquis - holding today Code Status: dnr/dni Family Communication: wife at bedside  Disposition:   Status is: Inpatient Remains inpatient appropriate because: continued need for inpt care    Consultants:  Palliative care   Procedures:  Echo IMPRESSIONS     1. Left ventricular ejection fraction, by estimation, is 60 to 65%. The  left ventricle has normal function. The left ventricle has no regional  wall motion abnormalities. There is mild concentric left ventricular  hypertrophy. Left ventricular diastolic  function could not be evaluated.   2. Right ventricular systolic function is normal. The right ventricular  size is  normal. There is mildly elevated pulmonary artery systolic  pressure. The estimated right ventricular systolic pressure is [id number] mmHg.   3. The mitral valve is grossly normal. No evidence of mitral valve  regurgitation. No evidence of mitral stenosis.   4. The aortic valve is tricuspid. There is mild calcification of the  aortic valve. There is mild thickening of the aortic valve. Aortic valve  regurgitation is not visualized. Aortic valve sclerosis/calcification is  present, without any evidence of  aortic stenosis.   5. The inferior vena cava is normal in size with greater than 50%  respiratory variability, suggesting right atrial pressure of 3 mmHg.   Comparison(s): No significant change from prior study.   2/2 LAPAROSCOPIC ASSISTED RIGHT COLECTOMY   Antimicrobials:  Anti-infectives (From admission, onward)    Start     Dose/Rate Route Frequency Ordered Stop   08/03/22 0600  cefoTEtan (CEFOTAN) 2 g in sodium chloride 0.9 % 100 mL IVPB        2 g 200 mL/hr over 30 Minutes Intravenous On call to O.R. 08/02/22 1527 08/03/22 1030       Subjective: C/o RUQ pain  Objective: Vitals:   08/09/22 2305 08/10/22 0346 08/10/22 0553 08/10/22 0832  BP: (!) 156/72 (!) 145/79  (!) 142/69  Pulse: 78   79  Resp: 18     Temp: 98.6 F (37 C) 98.6 F (37 C)  98.5 F (36.9 C)  TempSrc:  Oral Oral  Oral  SpO2: 93%     Weight:   108.9 kg   Height:        Intake/Output Summary (Last 24 hours) at 08/10/2022 0848 Last data filed at 08/10/2022 [id number] Gross per 24 hour  Intake 240 ml  Output 350 ml  Net -110 ml   Filed Weights   08/08/22 0624 08/09/22 0614 08/10/22 0553  Weight: 111.4 kg 109.5 kg 108.9 kg    Examination:  General: No acute distress. Cardiovascular: irregularly irregular  Lungs: unlabored Abdomen: mild distension, protuberant, staples to midline incision, RUQ TTP Neurological: Alert and oriented 3. Moves all extremities 4 with equal strength. Cranial nerves II through  XII grossly intact. Extremities: improved RLE edema    Data Reviewed: I have personally reviewed following labs and imaging studies  CBC: Recent Labs  Lab 08/05/22 0030 08/06/22 0026 08/07/22 0026 08/08/22 0741 08/08/22 1449 08/09/22 0019 08/10/22 0652  WBC 10.0 7.9 8.2 15.3*  --  13.2* 10.4  NEUTROABS 8.2* 6.3 6.2  --   --  10.3*  --   HGB 8.1* 8.0* 8.1* 10.1* 9.0* 8.8* 8.8*  HCT 27.4* 25.6* 25.9* 33.2* 29.3* 29.2* 29.5*  MCV 84.0 82.3 81.7 82.2  --  83.9 83.6  PLT 194 175 186 242  --  206 0000000    Basic Metabolic Panel: Recent Labs  Lab 08/05/22 0030 08/06/22 0026 08/07/22 0026 08/08/22 0741 08/09/22 0019 08/10/22 0652  NA 139 138 137 138 134* 135  K 3.7 3.6 3.3* 4.2 3.5 3.5  CL 112* 110 109 106 102 98  CO2 21* 21* 22 21* 23 24  GLUCOSE 164* 243* 211* 208* 211* 181*  BUN 16 18 11 10 12 13  $ CREATININE 1.25* 1.41* 1.26* 1.32* 1.28* 1.14  CALCIUM 8.3* 8.0* 8.0* 8.4* 8.0* 8.1*  MG 1.9  --   --  1.8 1.8 2.0  PHOS  --   --   --   --  2.1* 2.9    GFR: Estimated Creatinine Clearance: 58.4 mL/min (by C-G formula based on SCr of 1.14 mg/dL).  Liver Function Tests: Recent Labs  Lab 08/05/22 0030 08/09/22 0019  AST 13* 19  ALT 13 30  ALKPHOS 67 96  BILITOT 0.8 0.6  PROT 5.7* 5.5*  ALBUMIN 2.6* 2.1*    CBG: Recent Labs  Lab 08/09/22 0609 08/09/22 1123 08/09/22 1552 08/09/22 2107 08/10/22 0549  GLUCAP 167* 322* 256* 280* 184*     Recent Results (from the past 240 hour(s))  MRSA Next Gen by PCR, Nasal     Status: None   Collection Time: 07/31/22  4:30 PM   Specimen: Nasal Mucosa; Nasal Swab  Result Value Ref Range Status   MRSA by PCR Next Gen NOT DETECTED NOT DETECTED Final    Comment: (NOTE) The GeneXpert MRSA Assay (FDA approved for NASAL specimens only), is one component of Tyriana Helmkamp comprehensive MRSA colonization surveillance program. It is not intended to diagnose MRSA infection nor to guide or monitor treatment for MRSA infections. Test  performance is not FDA approved in patients less than 62 years old. Performed at [facility], [place] [address] [place], [place] [address]   Culture, blood (Routine X 2) w Reflex to ID Panel     Status: None (Preliminary result)   Collection Time: 08/08/22  2:49 PM   Specimen: BLOOD  Result Value Ref Range Status   Specimen Description BLOOD RIGHT ANTECUBITAL  Final   Special Requests   Final  BOTTLES DRAWN AEROBIC AND ANAEROBIC Blood Culture adequate volume   Culture   Final    NO GROWTH < 24 HOURS Performed at [facility], [place] [address], [place], [place] [address]    Report Status PENDING  Incomplete  Culture, blood (Routine X 2) w Reflex to ID Panel     Status: None (Preliminary result)   Collection Time: 08/08/22  2:50 PM   Specimen: BLOOD  Result Value Ref Range Status   Specimen Description BLOOD RIGHT ANTECUBITAL  Final   Special Requests   Final    BOTTLES DRAWN AEROBIC AND ANAEROBIC Blood Culture adequate volume   Culture   Final    NO GROWTH < 24 HOURS Performed at [facility], [place] [address], [place], [place] [address]    Report Status PENDING  Incomplete         Radiology Studies: VAS [place] LOWER EXTREMITY VENOUS (DVT)  Result Date: 08/09/2022  Lower Venous DVT Study Patient Name:  Ryan Garcia  Date of Exam:   08/08/2022 Medical Rec #: [id number]      Accession #:    [id number] Date of Birth: 03-07-1934      Patient Gender: M Patient Age:   41 years Exam Location:  [facility] Procedure:      VAS [place] LOWER EXTREMITY VENOUS (DVT) Referring Phys: Taia Bramlett POWELL JR --------------------------------------------------------------------------------  Indications: Swelling, and Edema. Other Indications: S/P partial colectomy. Performing Technologist: Oda Cogan RDMS, RVT  Examination Guidelines: Rayelynn Loyal complete evaluation includes B-mode imaging, spectral Doppler, color Doppler, and power Doppler as needed of all accessible portions of each vessel. Bilateral  testing is considered an integral part of Bruk Tumolo complete examination. Limited examinations for reoccurring indications may be performed as noted. The reflux portion of the exam is performed with the patient in reverse Trendelenburg.  +---------+---------------+---------+-----------+----------+--------------+ RIGHT    CompressibilityPhasicitySpontaneityPropertiesThrombus Aging +---------+---------------+---------+-----------+----------+--------------+ CFV      Full           Yes      Yes                                 +---------+---------------+---------+-----------+----------+--------------+ SFJ      Full                                                        +---------+---------------+---------+-----------+----------+--------------+ FV Prox  Full                                                        +---------+---------------+---------+-----------+----------+--------------+ FV Mid   Full                                                        +---------+---------------+---------+-----------+----------+--------------+ FV DistalFull                                                        +---------+---------------+---------+-----------+----------+--------------+  PFV      Full                                                        +---------+---------------+---------+-----------+----------+--------------+ POP      Full           Yes      Yes                                 +---------+---------------+---------+-----------+----------+--------------+ PTV      Full                                                        +---------+---------------+---------+-----------+----------+--------------+ PERO     Full                                                        +---------+---------------+---------+-----------+----------+--------------+   +---------+---------------+---------+-----------+----------+--------------+ LEFT      CompressibilityPhasicitySpontaneityPropertiesThrombus Aging +---------+---------------+---------+-----------+----------+--------------+ CFV      Full           Yes      Yes                                 +---------+---------------+---------+-----------+----------+--------------+ SFJ      Full                                                        +---------+---------------+---------+-----------+----------+--------------+ FV Prox  Full                                                        +---------+---------------+---------+-----------+----------+--------------+ FV Mid   Full                                                        +---------+---------------+---------+-----------+----------+--------------+ FV DistalFull                                                        +---------+---------------+---------+-----------+----------+--------------+ PFV      Full                                                        +---------+---------------+---------+-----------+----------+--------------+  POP      Full           Yes      Yes                                 +---------+---------------+---------+-----------+----------+--------------+ PTV      Full                                                        +---------+---------------+---------+-----------+----------+--------------+ PERO     Full                                                        +---------+---------------+---------+-----------+----------+--------------+     Summary: BILATERAL: - No evidence of deep vein thrombosis seen in the lower extremities, bilaterally. -No evidence of popliteal cyst, bilaterally.   *See table(s) above for measurements and observations. Electronically signed by Jamelle Haring on 08/09/2022 at 2:49:54 PM.    Final    CT ABDOMEN PELVIS W CONTRAST  Result Date: 08/08/2022 CLINICAL DATA:  Status post laparoscopic colectomy on February 2nd for colon cancer. Abdominal  pain. * Tracking Code: BO * EXAM: CT ABDOMEN AND PELVIS WITH CONTRAST TECHNIQUE: Multidetector CT imaging of the abdomen and pelvis was performed using the standard protocol following bolus administration of intravenous contrast. RADIATION DOSE REDUCTION: This exam was performed according to the departmental dose-optimization program which includes automated exposure control, adjustment of the mA and/or kV according to patient size and/or use of iterative reconstruction technique. CONTRAST:  91m OMNIPAQUE IOHEXOL 350 MG/ML SOLN COMPARISON:  07/31/2022 chest abdomen and pelvic CTs. FINDINGS: Lower chest: Right greater than left base dependent compressive atelectasis. Right lower lobe calcified granuloma. Mild cardiomegaly with multivessel coronary artery calcification. Tiny bilateral pleural effusions. Hepatobiliary: Normal liver. Gallstones up to 6 mm without acute cholecystitis or biliary duct dilatation. Pancreas: Normal, without mass or ductal dilatation. Spleen: Normal in size, without focal abnormality. Adrenals/Urinary Tract: Normal adrenal glands. Bilateral renal cysts including up to 12.1 cm. Upper pole right renal 2.5 cm lesion has been evaluated previously on dedicated imaging (11/8 today). No hydronephrosis. Normal urinary bladder. Stomach/Bowel: Normal stomach, without wall thickening. Descending duodenal diverticulum is tiny. Otherwise normal small bowel. Scattered colonic diverticula. Status post partial right hemicolectomy. Vascular/Lymphatic: Aortic atherosclerosis. No abdominopelvic adenopathy. Reproductive: Mild prostatomegaly. Other: Trace fluid within the pelvic cul-de-sac is new. There is also ill-defined small volume right perihepatic fluid, just lateral to the surgical sutures. Example at 4.9 x 1.4 cm on 41/3. 6.2 cm craniocaudal on 58/6. No surrounding enhancement or gas within. No intra-abdominal/pelvic extraluminal gas. There is trace gas within the anterior abdominal wall which is  presumably postoperative in the setting of midline laparotomy staples. Musculoskeletal: No acute osseous abnormality. IMPRESSION: 1. Status post partial right hemicolectomy, without acute postoperative complication. 2. Small volume right perihepatic fluid, just lateral to the surgical site. Likely an area of loculated ascites. No abnormal enhancement or gas within to suggest abscess. 3. Tiny bilateral pleural effusions and bibasilar atelectasis. Trace cul-de-sac fluid. 4. Cholelithiasis 5. Upper pole right renal lesion has been detailed on dedicated imaging as  suspicious for renal cell carcinoma. 6. Coronary artery atherosclerosis. Aortic Atherosclerosis (ICD10-I70.0). Electronically Signed   By: Abigail Miyamoto M.D.   On: 08/08/2022 12:02   DG CHEST PORT 1 VIEW  Result Date: 08/08/2022 CLINICAL DATA:  Chest pain. EXAM: PORTABLE CHEST 1 VIEW COMPARISON:  CT chest 07/31/2022 and chest radiograph 07/30/2022. FINDINGS: Trachea is midline. Heart is enlarged, stable. Lungs are somewhat low in volume with mild bibasilar interstitial prominence and indistinctness. Tiny bilateral pleural effusions. IMPRESSION: Tiny bilateral effusions with suspected mild pulmonary edema. Electronically Signed   By: Lorin Picket M.D.   On: 08/08/2022 09:11        Scheduled Meds:  acetaminophen  1,000 mg Oral Q6H   docusate sodium  100 mg Oral BID   feeding supplement (GLUCERNA SHAKE)  237 mL Oral BID BM   furosemide  20 mg Oral Daily   insulin aspart  0-15 Units Subcutaneous TID AC & HS   insulin detemir  15 Units Subcutaneous Daily   isosorbide mononitrate  30 mg Oral Daily   metoprolol tartrate  12.5 mg Oral BID   pantoprazole  40 mg Oral Q0600   polyethylene glycol  17 g Oral BID   rosuvastatin  40 mg Oral Daily   Continuous Infusions:     LOS: 10 days    Time spent: over 30 min    Fayrene Helper, MD [facility]   To contact the attending provider between 7A-7P or the covering provider during  after hours 7P-7A, please log into the web site [email or web address] and access using universal [place] password for that web site. If you do not have the password, please call the hospital operator.  08/10/2022, 8:48 AM

## 2022-08-10 NOTE — Plan of Care (Signed)

## 2022-08-10 NOTE — Progress Notes (Signed)
Physical Therapy Treatment Patient Details Name: Ryan Garcia MRN: [id number] DOB: 02/05/1934 Today's Date: 08/10/2022   History of Present Illness Pt is an 89 y.o. male who presented 07/30/22 with exertional chest pain and found to have elevated troponin and anemia. Pt admitted with NSTEMI and found ascending colon mass concerning for malignancy s/p endoscopy and colonoscopy 1/31. R colectomy on 2/2. PMH: arthritis, CAD, CKD, DM, GERD, HLD, hx of skin cancer, HTN, NSTEMI, paroxysmal atrial flutter, renal mass, scoliosis    PT Comments    Pt progressing well with mobility, ambulating great hallway distance with use of RW for support. Pt can ambulate without RW, but benefits from use for steadying and plans to use it at home at least initially for balance. Pt navigated x10 steps with use of rails and without difficulty, pt does need cues to slow down to ensure safety. Pt's wife present for session, both pt and spouse ready for pt to d/c. Will continue to follow.     Recommendations for follow up therapy are one component of a multi-disciplinary discharge planning process, led by the attending physician.  Recommendations may be updated based on patient status, additional functional criteria and insurance authorization.  Follow Up Recommendations  Home health PT     Assistance Recommended at Discharge Intermittent Supervision/Assistance  Patient can return home with the following Assistance with cooking/housework;Assist for transportation;Help with stairs or ramp for entrance;A little help with walking and/or transfers;A little help with bathing/dressing/bathroom   Equipment Recommendations  None recommended by PT    Recommendations for Other Services       Precautions / Restrictions Precautions Precautions: Fall;Other (comment) Precaution Comments: monitor HR (HRmax observed 116 bpm) Restrictions Weight Bearing Restrictions: No     Mobility  Bed Mobility Overal bed mobility: Needs  Assistance Bed Mobility: Sidelying to Sit, Rolling, Sit to Sidelying Rolling: Supervision Sidelying to sit: Supervision     Sit to sidelying: Supervision General bed mobility comments: for safety, increased time and does use bedrail    Transfers Overall transfer level: Needs assistance Equipment used: Rolling walker (2 wheels) Transfers: Sit to/from Stand Sit to Stand: Supervision           General transfer comment: safety, cues for correct hand placement when rising/sitting    Ambulation/Gait Ambulation/Gait assistance: Supervision Gait Distance (Feet): 450 Feet Assistive device: Rolling walker (2 wheels) Gait Pattern/deviations: Step-through pattern, Decreased stride length, Trunk flexed Gait velocity: decr     General Gait Details: cues for keeping RW in close proximity to self, upright posture   Stairs Stairs: Yes Stairs assistance: Supervision Stair Management: Two rails, Alternating pattern, Forwards Number of Stairs: 10 General stair comments: cues to slow down, no evidence of imbalance   Wheelchair Mobility    Modified Rankin (Stroke Patients Only)       Balance Overall balance assessment: Mild deficits observed, not formally tested                                          Cognition Arousal/Alertness: Awake/alert Behavior During Therapy: WFL for tasks assessed/performed Overall Cognitive Status: Within Functional Limits for tasks assessed                                 General Comments: cues to slow down and wait for assist  Exercises      General Comments        Pertinent Vitals/Pain Pain Assessment Pain Assessment: Faces Faces Pain Scale: Hurts even more Pain Location: R flank intermittently Pain Descriptors / Indicators: Sore, Discomfort Pain Intervention(s): Limited activity within patient's tolerance, Monitored during session, Repositioned    Home Living                           Prior Function            PT Goals (current goals can now be found in the care plan section) Acute Rehab PT Goals Patient Stated Goal: to get home PT Goal Formulation: With patient/family Time For Goal Achievement: 08/16/22 Potential to Achieve Goals: Good Progress towards PT goals: Progressing toward goals    Frequency    Min 3X/week      PT Plan Current plan remains appropriate    Co-evaluation              AM-PAC PT "6 Clicks" Mobility   Outcome Measure  Help needed turning from your back to your side while in a flat bed without using bedrails?: A Little Help needed moving from lying on your back to sitting on the side of a flat bed without using bedrails?: A Little Help needed moving to and from a bed to a chair (including a wheelchair)?: A Little Help needed standing up from a chair using your arms (e.g., wheelchair or bedside chair)?: A Little Help needed to walk in hospital room?: A Little Help needed climbing 3-5 steps with a railing? : A Little 6 Click Score: 18    End of Session   Activity Tolerance: Patient tolerated treatment well Patient left: with call bell/phone within reach;with family/visitor present;in bed Nurse Communication: Mobility status PT Visit Diagnosis: Unsteadiness on feet (R26.81);Other abnormalities of gait and mobility (R26.89);Muscle weakness (generalized) (M62.81)     Time: [id number] PT Time Calculation (min) (ACUTE ONLY): 18 min  Charges:  $Gait Training: 8-22 mins                     Stacie Glaze, PT DPT Acute Rehabilitation Services Pager [phone]  Office [phone]  [place] 08/10/2022, 11:40 AM

## 2022-08-10 NOTE — Progress Notes (Cosign Needed Addendum)
[facility] Progress Note  7 Days Post-Op  Subjective: CC-  Continues to have some intermittent RUQ abdominal pain. Appetite somewhat suppressed but he is tolerating what he is eating. Denies n/v. Passing flatus this morning. He did have a small/moderate, non-bloody stool yesterday morning.  WBC normalized, afebrile H/h stable at 8.8  Objective: Vital signs in last 24 hours: Temp:  [98.1 F (36.7 C)-98.6 F (37 C)] 98.5 F (36.9 C) (02/09 0832) Pulse Rate:  [69-84] 79 (02/09 0832) Resp:  [18-22] 18 (02/08 2305) BP: (142-156)/(67-79) 142/69 (02/09 0832) SpO2:  [93 %-94 %] 93 % (02/08 2305) Weight:  [108.9 kg] 108.9 kg (02/09 0553) Last BM Date : 08/09/22  Intake/Output from previous day: 02/08 0701 - 02/09 0700 In: -  Out: 350 [Urine:350] Intake/Output this shift: Total I/O In: 240 [P.O.:240] Out: -   PE: Gen:  Alert, NAD Abd: soft, protuberant, focally tender in the RUQ without rebound or guarding/ this area is softer and less full feeling than yesterday. midline incision cdi with staples present and no erythema or drainage, port site also cdi with staple present  Lab Results:  Recent Labs    08/09/22 0019 08/10/22 0652  WBC 13.2* 10.4  HGB 8.8* 8.8*  HCT 29.2* 29.5*  PLT 206 195   BMET Recent Labs    08/09/22 0019 08/10/22 0652  NA 134* 135  K 3.5 3.5  CL 102 98  CO2 23 24  GLUCOSE 211* 181*  BUN 12 13  CREATININE 1.28* 1.14  CALCIUM 8.0* 8.1*   PT/INR Recent Labs    08/08/22 0741  LABPROT 19.4*  INR 1.7*   CMP     Component Value Date/Time   NA 135 08/10/2022 0652   NA 144 12/06/2021 1344   K 3.5 08/10/2022 0652   CL 98 08/10/2022 0652   CO2 24 08/10/2022 0652   GLUCOSE 181 (H) 08/10/2022 0652   BUN 13 08/10/2022 0652   BUN 22 12/06/2021 1344   CREATININE 1.14 08/10/2022 0652   CALCIUM 8.1 (L) 08/10/2022 0652   PROT 5.5 (L) 08/09/2022 0019   PROT 6.5 09/15/2019 0810   ALBUMIN 2.1 (L) 08/09/2022 0019   ALBUMIN 4.1  09/15/2019 0810   AST 19 08/09/2022 0019   ALT 30 08/09/2022 0019   ALKPHOS 96 08/09/2022 0019   BILITOT 0.6 08/09/2022 0019   BILITOT 0.3 09/15/2019 0810   GFRNONAA >60 08/10/2022 0652   GFRAA >60 07/03/2018 0304   Lipase     Component Value Date/Time   LIPASE 28 04/16/2020 0111       Studies/Results: VAS [place] LOWER EXTREMITY VENOUS (DVT)  Result Date: 08/09/2022  Lower Venous DVT Study Patient Name:  Ryan Garcia  Date of Exam:   08/08/2022 Medical Rec #: [id number]      Accession #:    [id number] Date of Birth: 1934-05-19      Patient Gender: M Patient Age:   89 years Exam Location:  [facility] Procedure:      VAS [place] LOWER EXTREMITY VENOUS (DVT) Referring Phys: A POWELL JR --------------------------------------------------------------------------------  Indications: Swelling, and Edema. Other Indications: S/P partial colectomy. Performing Technologist: Oda Cogan RDMS, RVT  Examination Guidelines: A complete evaluation includes B-mode imaging, spectral Doppler, color Doppler, and power Doppler as needed of all accessible portions of each vessel. Bilateral testing is considered an integral part of a complete examination. Limited examinations for reoccurring indications may be performed as noted. The reflux portion of the exam is performed with  the patient in reverse Trendelenburg.  +---------+---------------+---------+-----------+----------+--------------+ RIGHT    CompressibilityPhasicitySpontaneityPropertiesThrombus Aging +---------+---------------+---------+-----------+----------+--------------+ CFV      Full           Yes      Yes                                 +---------+---------------+---------+-----------+----------+--------------+ SFJ      Full                                                        +---------+---------------+---------+-----------+----------+--------------+ FV Prox  Full                                                         +---------+---------------+---------+-----------+----------+--------------+ FV Mid   Full                                                        +---------+---------------+---------+-----------+----------+--------------+ FV DistalFull                                                        +---------+---------------+---------+-----------+----------+--------------+ PFV      Full                                                        +---------+---------------+---------+-----------+----------+--------------+ POP      Full           Yes      Yes                                 +---------+---------------+---------+-----------+----------+--------------+ PTV      Full                                                        +---------+---------------+---------+-----------+----------+--------------+ PERO     Full                                                        +---------+---------------+---------+-----------+----------+--------------+   +---------+---------------+---------+-----------+----------+--------------+ LEFT     CompressibilityPhasicitySpontaneityPropertiesThrombus Aging +---------+---------------+---------+-----------+----------+--------------+ CFV      Full           Yes      Yes                                 +---------+---------------+---------+-----------+----------+--------------+  SFJ      Full                                                        +---------+---------------+---------+-----------+----------+--------------+ FV Prox  Full                                                        +---------+---------------+---------+-----------+----------+--------------+ FV Mid   Full                                                        +---------+---------------+---------+-----------+----------+--------------+ FV DistalFull                                                         +---------+---------------+---------+-----------+----------+--------------+ PFV      Full                                                        +---------+---------------+---------+-----------+----------+--------------+ POP      Full           Yes      Yes                                 +---------+---------------+---------+-----------+----------+--------------+ PTV      Full                                                        +---------+---------------+---------+-----------+----------+--------------+ PERO     Full                                                        +---------+---------------+---------+-----------+----------+--------------+     Summary: BILATERAL: - No evidence of deep vein thrombosis seen in the lower extremities, bilaterally. -No evidence of popliteal cyst, bilaterally.   *See table(s) above for measurements and observations. Electronically signed by Jamelle Haring on 08/09/2022 at 2:49:54 PM.    Final    CT ABDOMEN PELVIS W CONTRAST  Result Date: 08/08/2022 CLINICAL DATA:  Status post laparoscopic colectomy on February 2nd for colon cancer. Abdominal pain. * Tracking Code: BO * EXAM: CT ABDOMEN AND PELVIS WITH CONTRAST TECHNIQUE: Multidetector CT imaging of the abdomen and pelvis was performed using the standard protocol following bolus administration of intravenous contrast. RADIATION DOSE REDUCTION: This  exam was performed according to the departmental dose-optimization program which includes automated exposure control, adjustment of the mA and/or kV according to patient size and/or use of iterative reconstruction technique. CONTRAST:  43m OMNIPAQUE IOHEXOL 350 MG/ML SOLN COMPARISON:  07/31/2022 chest abdomen and pelvic CTs. FINDINGS: Lower chest: Right greater than left base dependent compressive atelectasis. Right lower lobe calcified granuloma. Mild cardiomegaly with multivessel coronary artery calcification. Tiny bilateral pleural effusions.  Hepatobiliary: Normal liver. Gallstones up to 6 mm without acute cholecystitis or biliary duct dilatation. Pancreas: Normal, without mass or ductal dilatation. Spleen: Normal in size, without focal abnormality. Adrenals/Urinary Tract: Normal adrenal glands. Bilateral renal cysts including up to 12.1 cm. Upper pole right renal 2.5 cm lesion has been evaluated previously on dedicated imaging (11/8 today). No hydronephrosis. Normal urinary bladder. Stomach/Bowel: Normal stomach, without wall thickening. Descending duodenal diverticulum is tiny. Otherwise normal small bowel. Scattered colonic diverticula. Status post partial right hemicolectomy. Vascular/Lymphatic: Aortic atherosclerosis. No abdominopelvic adenopathy. Reproductive: Mild prostatomegaly. Other: Trace fluid within the pelvic cul-de-sac is new. There is also ill-defined small volume right perihepatic fluid, just lateral to the surgical sutures. Example at 4.9 x 1.4 cm on 41/3. 6.2 cm craniocaudal on 58/6. No surrounding enhancement or gas within. No intra-abdominal/pelvic extraluminal gas. There is trace gas within the anterior abdominal wall which is presumably postoperative in the setting of midline laparotomy staples. Musculoskeletal: No acute osseous abnormality. IMPRESSION: 1. Status post partial right hemicolectomy, without acute postoperative complication. 2. Small volume right perihepatic fluid, just lateral to the surgical site. Likely an area of loculated ascites. No abnormal enhancement or gas within to suggest abscess. 3. Tiny bilateral pleural effusions and bibasilar atelectasis. Trace cul-de-sac fluid. 4. Cholelithiasis 5. Upper pole right renal lesion has been detailed on dedicated imaging as suspicious for renal cell carcinoma. 6. Coronary artery atherosclerosis. Aortic Atherosclerosis (ICD10-I70.0). Electronically Signed   By: KAbigail MiyamotoM.D.   On: 08/08/2022 12:02   DG CHEST PORT 1 VIEW  Result Date: 08/08/2022 CLINICAL DATA:  Chest  pain. EXAM: PORTABLE CHEST 1 VIEW COMPARISON:  CT chest 07/31/2022 and chest radiograph 07/30/2022. FINDINGS: Trachea is midline. Heart is enlarged, stable. Lungs are somewhat low in volume with mild bibasilar interstitial prominence and indistinctness. Tiny bilateral pleural effusions. IMPRESSION: Tiny bilateral effusions with suspected mild pulmonary edema. Electronically Signed   By: MLorin PicketM.D.   On: 08/08/2022 09:11    Anti-infectives: Anti-infectives (From admission, onward)    Start     Dose/Rate Route Frequency Ordered Stop   08/03/22 0600  cefoTEtan (CEFOTAN) 2 g in sodium chloride 0.9 % 100 mL IVPB        2 g 200 mL/hr over 30 Minutes Intravenous On call to O.R. 08/02/22 1527 08/03/22 1030        Assessment/Plan POD 7, s/p lap assisted right colectomy by Dr. TMarlou Starks2/2/24 for Ascending colon mass concerning for malignancy - surgical path: Invasive moderately differentiated adenocarcinoma with extracellular mucin 6 cm, Carcinoma very focally invades into the pericolonic soft tissue, Metastatic carcinoma to one of eighteen lymph nodes (1/18), one tumor  Deposit. Will need oncology follow up. - CEA 6.1. CT chest abdomen pelvis done without findings of metastatic disease.  - CT 2/7 without acute postop complication, he does appear to have a lot of retained stool in his colon - Give milk of magnesia today. Needs to continue good bowel regimen at home. If he has a good BM and RUQ pain stable/improved I think he could go home  later today. Ok to resume eliquis. Discharge instructions and follow up info on AVS. We will sign off, please call with questions or concerns.  - Continue therapies - recommending home health PT   FEN: CM diet ID: none VTE: ok to resume eliquis   --Per primary-- NSTEMI - present on admission, likely secondary to demand ischemia. Cardiology following CAD s/p PCI afib on eliquis CHF HTN HLD OSA right renal mass DNR/DNI    LOS: 10 days    Wellington Hampshire, [facility] 08/10/2022, 8:57 AM Please see Amion for pager number during day hours 7:00am-4:30pm

## 2022-08-10 NOTE — Progress Notes (Signed)
Mobility Specialist Progress Note:   08/10/22 1538  Mobility  Activity Ambulated with assistance in hallway  Level of Assistance Standby assist, set-up cues, supervision of patient - no hands on  Assistive Device Front wheel walker  Distance Ambulated (ft) 300 ft  Activity Response Tolerated well  Mobility Referral Yes  $Mobility charge 1 Mobility   Pt received in bed and agreeable. No complaints. Pt returned to bed with all needs met, call bell in reach, and family in room.   Andrey Campanile Mobility Specialist Please contact via SecureChat or  Rehab office at [phone]

## 2022-08-10 NOTE — Progress Notes (Signed)
Occupational Therapy Treatment Patient Details Name: Ryan Garcia MRN: [id number] DOB: August 01, 1933 Today's Date: 08/10/2022   History of present illness Pt is an 89 y.o. male who presented 07/30/22 with exertional chest pain and found to have elevated troponin and anemia. Pt admitted with NSTEMI and found ascending colon mass concerning for malignancy s/p endoscopy and colonoscopy 1/31. R colectomy on 2/2. PMH: arthritis, CAD, CKD, DM, GERD, HLD, hx of skin cancer, HTN, NSTEMI, paroxysmal atrial flutter, renal mass, scoliosis   OT comments  Pt progressing towards acute OT goals. Focus of session was energy conservation education as pertains to managing ADL routine at home. Spouse present. Supervision for toilet transfer. D/c recommendation remains appropriate.    Recommendations for follow up therapy are one component of a multi-disciplinary discharge planning process, led by the attending physician.  Recommendations may be updated based on patient status, additional functional criteria and insurance authorization.    Follow Up Recommendations  No OT follow up     Assistance Recommended at Discharge PRN  Patient can return home with the following  A little help with bathing/dressing/bathroom;Assistance with cooking/housework;Help with stairs or ramp for entrance   Equipment Recommendations  None recommended by OT    Recommendations for Other Services      Precautions / Restrictions Precautions Precautions: Fall;Other (comment) Precaution Comments: monitor HR Restrictions Weight Bearing Restrictions: No       Mobility Bed Mobility               General bed mobility comments: not observed this session    Transfers Overall transfer level: Needs assistance Equipment used: Rolling walker (2 wheels) Transfers: Sit to/from Stand Sit to Stand: Supervision                 Balance Overall balance assessment: Mild deficits observed, not formally tested                                          ADL either performed or assessed with clinical judgement   ADL Overall ADL's : Needs assistance/impaired                         Toilet Transfer: Min guard;Ambulation   Toileting- Clothing Manipulation and Hygiene: Moderate assistance;Sit to/from stand Toileting - Clothing Manipulation Details (indicate cue type and reason): asist for pericare in standing     Functional mobility during ADLs: Min guard      Extremity/Trunk Assessment Upper Extremity Assessment Upper Extremity Assessment: Overall WFL for tasks assessed;Generalized weakness   Lower Extremity Assessment Lower Extremity Assessment: Defer to PT evaluation        Vision       Perception     Praxis      Cognition Arousal/Alertness: Awake/alert Behavior During Therapy: WFL for tasks assessed/performed Overall Cognitive Status: Within Functional Limits for tasks assessed                                          Exercises      Shoulder Instructions       General Comments wife present throughout session    Pertinent Vitals/ Pain       Pain Assessment Pain Assessment: Faces Faces Pain Scale: Hurts even more Pain Location: R flank intermittently  Pain Descriptors / Indicators: Sore, Discomfort Pain Intervention(s): Monitored during session  Home Living                                          Prior Functioning/Environment              Frequency  Min 2X/week        Progress Toward Goals  OT Goals(current goals can now be found in the care plan section)  Progress towards OT goals: Progressing toward goals  Acute Rehab OT Goals Patient Stated Goal: home soon OT Goal Formulation: With patient/family Time For Goal Achievement: 08/18/22 Potential to Achieve Goals: Good ADL Goals Pt Will Perform Lower Body Bathing: with modified independence;sit to/from stand Pt Will Perform Lower Body Dressing: with  modified independence;sit to/from stand Pt Will Transfer to Toilet: Independently;ambulating Pt Will Perform Tub/Shower Transfer: Tub transfer;with supervision;ambulating;grab bars  Plan Discharge plan remains appropriate    Co-evaluation                 AM-PAC OT "6 Clicks" Daily Activity     Outcome Measure   Help from another person eating meals?: None Help from another person taking care of personal grooming?: A Little Help from another person toileting, which includes using toliet, bedpan, or urinal?: A Little Help from another person bathing (including washing, rinsing, drying)?: A Little Help from another person to put on and taking off regular upper body clothing?: A Little Help from another person to put on and taking off regular lower body clothing?: A Little 6 Click Score: 19    End of Session Equipment Utilized During Treatment: Rolling walker (2 wheels)  OT Visit Diagnosis: Unsteadiness on feet (R26.81);Other abnormalities of gait and mobility (R26.89)   Activity Tolerance Patient tolerated treatment well   Patient Left Other (comment);with call bell/phone within reach;with family/visitor present (sitting EOB to eat lunch)   Nurse Communication          [id number] OT Time Calculation (min): 10 min  Charges: OT General Charges $OT Visit: 1 Visit OT Treatments $Self Care/Home Management : 8-22 mins  Tyrone Schimke, OT Acute Rehabilitation Services Office: [phone]   Hortencia Pilar 08/10/2022, 2:12 PM

## 2022-08-10 NOTE — Progress Notes (Signed)
ANTICOAGULATION CONSULT NOTE - Initial Consult  Pharmacy Consult for eliquis Indication: atrial fibrillation  Allergies  Allergen Reactions   Loratadine Other (See Comments)    Not effective   Simvastatin Other (See Comments)    Intolerant- caused joint pain    Patient Measurements: Height: 5' 11"$  (180.3 cm) Weight: 108.9 kg (240 lb 1.3 oz) IBW/kg (Calculated) : 75.3 Heparin Dosing Weight: n/a  Vital Signs: Temp: 98.4 F (36.9 C) (02/09 1103) Temp Source: Oral (02/09 1103) BP: 142/69 (02/09 0832) Pulse Rate: 79 (02/09 0832)  Labs: Recent Labs    08/08/22 0741 08/08/22 1449 08/09/22 0019 08/10/22 0652  HGB 10.1* 9.0* 8.8* 8.8*  HCT 33.2* 29.3* 29.2* 29.5*  PLT 242  --  206 195  LABPROT 19.4*  --   --   --   INR 1.7*  --   --   --   CREATININE 1.32*  --  1.28* 1.14    Estimated Creatinine Clearance: 58.4 mL/min (by C-G formula based on SCr of 1.14 mg/dL).   Medical History: Past Medical History:  Diagnosis Date   AC (acromioclavicular) joint bone spurs    lt ankle   Allergy    allergic rhinitis   Anginal pain (HCC)    Arthritis    OA   BPH (benign prostatic hyperplasia)    microwave tx of prostate   CAD (coronary artery disease)    a. s/p cath in 2012 showing 70-75% LAD stenosis, 75% D1 stenosis, 75% OM1, 60-70% RCA stenosis, and 80% PDA --> medical therapy pursued b. similar findings by cath in 2016; 06/2017 DES to distal RCA   Chronic kidney disease    Chronic upper back pain    Diabetes mellitus    type II x8 years   GERD (gastroesophageal reflux disease)    HLD (hyperlipidemia)    10/12: TC 208, TG 213, HDL 36, LDL 129   Hx of skin cancer, basal cell    many skin cancers removed/under constant treatment.   Hypertension    NSTEMI (non-ST elevated myocardial infarction) ([place]) 06/18/2017   DES to distal RCA   Obstructive sleep apnea on CPAP    Paroxysmal atrial flutter (HCC)    Renal mass    Scoliosis     Assessment: 89 yo M with PMH afib on  Eliquis PTA. Pharmacy consulted to restart Eliquis. Ok per surgery/primary.   Hgb 8.8. PLT 195  Goal of Therapy:  Monitor platelets by anticoagulation protocol: Yes   Plan:  Eliquis 5 BID (as PTA) - first dose tonight  F/u CBC, s/sx bleeding   Wilson Singer, PharmD Clinical Pharmacist 08/10/2022 1:47 PM

## 2022-08-11 DIAGNOSIS — Z515 Encounter for palliative care: Secondary | ICD-10-CM | POA: Diagnosis not present

## 2022-08-11 DIAGNOSIS — I214 Non-ST elevation (NSTEMI) myocardial infarction: Secondary | ICD-10-CM | POA: Diagnosis not present

## 2022-08-11 LAB — BASIC METABOLIC PANEL
Anion gap: 10 (ref 5–15)
BUN: 16 mg/dL (ref 8–23)
CO2: 25 mmol/L (ref 22–32)
Calcium: 8 mg/dL — ABNORMAL LOW (ref 8.9–10.3)
Chloride: 102 mmol/L (ref 98–111)
Creatinine, Ser: 1.26 mg/dL — ABNORMAL HIGH (ref 0.61–1.24)
GFR, Estimated: 56 mL/min — ABNORMAL LOW (ref >60)
Glucose, Bld: 192 mg/dL — ABNORMAL HIGH (ref 70–99)
Potassium: 3.6 mmol/L (ref 3.5–5.1)
Sodium: 137 mmol/L (ref 135–145)

## 2022-08-11 LAB — CBC
HCT: 28.9 % — ABNORMAL LOW (ref 39.0–52.0)
Hemoglobin: 8.7 g/dL — ABNORMAL LOW (ref 13.0–17.0)
MCH: 25.1 pg — ABNORMAL LOW (ref 26.0–34.0)
MCHC: 30.1 g/dL (ref 30.0–36.0)
MCV: 83.5 fL (ref 80.0–100.0)
Platelets: 206 10*3/uL (ref 150–400)
RBC: 3.46 MIL/uL — ABNORMAL LOW (ref 4.22–5.81)
RDW: 18.8 % — ABNORMAL HIGH (ref 11.5–15.5)
WBC: 8.5 10*3/uL (ref 4.0–10.5)
nRBC: 0 % (ref 0.0–0.2)

## 2022-08-11 LAB — MAGNESIUM: Magnesium: 2.2 mg/dL (ref 1.7–2.4)

## 2022-08-11 LAB — PHOSPHORUS: Phosphorus: 2.8 mg/dL (ref 2.5–4.6)

## 2022-08-11 LAB — GLUCOSE, CAPILLARY
Glucose-Capillary: 254 mg/dL — ABNORMAL HIGH (ref 70–99)
Glucose-Capillary: 275 mg/dL — ABNORMAL HIGH (ref 70–99)

## 2022-08-11 MED ORDER — POLYETHYLENE GLYCOL 3350 17 G PO PACK: 17.0000 g | PACK | Freq: Every day | ORAL | Status: DC

## 2022-08-11 MED ORDER — PANTOPRAZOLE SODIUM 40 MG PO TBEC: 40.0000 mg | DELAYED_RELEASE_TABLET | Freq: Every day | ORAL | 1 refills | Status: DC

## 2022-08-11 NOTE — Discharge Summary (Signed)
Physician Discharge Summary  Ryan Garcia [id number] DOB: January 17, 1934 DOA: 07/30/2022  PCP: Abner Greenspan, MD  Admit date: 07/30/2022 Discharge date: 08/11/2022  Time spent: 40 minutes  Recommendations for Outpatient Follow-up:  Follow outpatient CBC/CMP  Follow with oncology outpatient Follow with surgery outpatient Follow anemia outpatient on eliquis Follow with urology outpatient   Watch blood sugars outpatient Follow RUQ pain outpatient  Discharge Diagnoses:  Principal Problem:   NSTEMI (non-ST elevated myocardial infarction) [facility]) Active Problems:   Ascending colon malignant neoplasm ([place])   Benign neoplasm of transverse colon   Benign neoplasm of sigmoid colon   Benign neoplasm of rectum   Duodenitis   Discharge Condition: stable  Diet recommendation: diabetic, heart healthy   Filed Weights   08/08/22 0624 08/09/22 0614 08/10/22 0553  Weight: 111.4 kg 109.5 kg 108.9 kg    History of present illness:  89 y.o. male with medical history significant for triple-vessel coronary artery disease status post PCI with stent placement, paroxysmal [name]-fib on Eliquis, chronic diastolic CHF, hypertension, hyperlipidemia, OSA on CPAP, right renal mass diagnosed 2 years ago, followed by urology presented with exertional chest pain.  On presentation, EKG did not show any evidence of acute ischemia but troponins were 93 and subsequently 425. Hemoglobin was 8.7 (from baseline of 13.1).  Eliquis was held.  He was started on IV Protonix. CT chest abdomen and pelvis with contrast showed no evidence of active bleeding however showed 2.5 x 2.6 cm enhancing lesion in the anterior right upper kidney grossly unchanged from prior MRI compatible with solid renal neoplasm such as renal cell carcinoma.  Cardiology and GI were consulted.  Palliative care was also consulted for goals of care discussion.    1/31: Underwent EGD that was normal biopsy obtained,Colonoscopy showed Malignant tumor in the mid  descending colon biopsied, had polyps in transverse colon and sigmoid colon,CEA  and surgery consult obtained.  Patient had CT chest abdomen pelvis showing renal lesion but without suspicion for metastatic disease.  Palliative care consulted> patient interested to pursue surgical option.  Patient underwent laparoscopic colectomy 2/2.  Also followed by cardiology for non-STEMI.    Stable for discharge home at this point.  Plan for outpatient follow up with oncology and surgery.   See below for additional details  Hospital Course:  Assessment and Plan:   Malignant right colonic mass: S/p egd/colonoscopy 1/31 Path from EGD/colonoscopy - small bowel bx (small intestinal mucosa with mild reactive changes) - stomach bx (gastric antral and oxyntic mucosa with mild chronic gastritis - h plyori like organisms are not identified) - colon ascending bx (invasive moderately differentiated adenocarcinoma) - colon transverse polypectomy (fragments of tubular adenoma, minute fragments of moderately differentiated adenocarcinoma) - colon, sigmoid, and rectal polypectomy (tubular adenoma without high grade dysplaisa or malignancy, hyperplastic polyp) Colonoscopy with malignant tumor in the mid descending colon biopsied, had polyps in transverse colon and sigmoid colon CT chest abdomen pelvis showing renal lesion but without suspicion for metastatic disease.  CEA 6.1. S/p OR lap assisted rt colectomy 08/03/22.  Surg path 08/03/22 with invasive moderately differentiated adenocarcinoma with extracellular mucin, carcinoma very focally invades into the pericolonic soft tissue, resection margins negative for carcinoma, metastatic carcinoma to one of eighteen LN's, one tumor deposit (see report) CT abd/pelvis for RUQ pain -> small volume R perihepatic fluid lateral to surgical site (area of loculated ascites).  Tiny bilateral effusions and bibasilar atelectasis.  Per discussion with surgery, appears to have retained stool in  colon.  Bowel regimen adjusted.  Persistent RUQ pain today, overall seems to be improving.  Follow with surgery outpatient. Diet per surgery  Appreciate further surgery recommendations   BRBPR Eliquis now resumed after discussion with surgery Likely related to recent surgery - "oozing from staple line" Had several bowel movements 2/9 without blood Return precautions given    Systemic Inflammatory Response Syndrome  Right Upper Quadrant Pain Elevated WBC, fever (both resolved today) CT abd/pelvis per surgery -> without any obvious infectious source, see report, did have area of loculated ascites, but no need for abx per discussion with surgery Will get CXR with tiny effusions and mild pulmonary edema and UA without evidence of UTI Blood cultures NGTD Follow with general surgery outpatient   NSTEMI type II/demand ischemia: Last cardiology note appears to be 08/03/2022.   Trop peaked at 651 Cardiology suspected demand ischemia in setting of anemia.  Known CAD on prior cath.  No plan for invasive workup.   Continue metop, statin, imdur.   Echo 1/30 with EF 60-65%, no RWMA, mildly elevated PASP   Iron deficiency anemia: Likely due to colon cancer.  Receiving iron via IV.  Will continue close monitoring.  Follow outpatient.    Acute on Chronic diastolic CHF: mild exacerbation with CXR with "tiny bilateral effusions with suspected mild pulmonary edema" 2/7 On 20 mg lasix daily at home -> resumed, LE edema is improved Continue metoprolol and imdur Strict I/O, daily weights Follow outpatient    Acute kidney injury  Creatinine fluctuating, baseline 1.1.  Peaked at 1.49.  at baseline.   Paroxysmal atrial fibrillation: Eliquis  Rate controlled on metoprolol.   Hypertension: continue Imdur and metoprolol.   HLD continue statin   Right renal mass concerning for renal cell carcinoma follow-up with urology as outpatient   OSA continue CPAP nightly   Type 2 diabetes mellitus: A1c 7.8 blood  sugar stable on SSI BG's have been high here, but on less than half of what he typically takes at home I think with appetite not quite normal, would recommend resuming half normal lantus and novolog dose as he returns home (he'll be on glipizide and metformin at home as well).  Can return to normal as his diet improves and if BG's appropriate.  Resume glipizide and metformin.    Physical deconditioning continue PT OT, recommended home after clinically stable.  Home health PT.   Goals of care palliative care following currently DNR/DNI.  Desires all clinical treatment.  Hard of hearing     Procedures: LE [place] Summary:  BILATERAL:  - No evidence of deep vein thrombosis seen in the lower extremities,  bilaterally.  -No evidence of popliteal cyst, bilaterally.   Echo IMPRESSIONS     1. Left ventricular ejection fraction, by estimation, is 60 to 65%. The  left ventricle has normal function. The left ventricle has no regional  wall motion abnormalities. There is mild concentric left ventricular  hypertrophy. Left ventricular diastolic  function could not be evaluated.   2. Right ventricular systolic function is normal. The right ventricular  size is normal. There is mildly elevated pulmonary artery systolic  pressure. The estimated right ventricular systolic pressure is 123XX123 mmHg.   3. The mitral valve is grossly normal. No evidence of mitral valve  regurgitation. No evidence of mitral stenosis.   4. The aortic valve is tricuspid. There is mild calcification of the  aortic valve. There is mild thickening of the aortic valve. Aortic valve  regurgitation is not visualized. Aortic  valve sclerosis/calcification is  present, without any evidence of  aortic stenosis.   5. The inferior vena cava is normal in size with greater than 50%  respiratory variability, suggesting right atrial pressure of 3 mmHg.   Comparison(s): No significant change from prior study.   Colonoscopy  08/01/2022  EGD 08/01/22  Consultations: Surgery cardiology  Discharge Exam: Vitals:   08/11/22 0439 08/11/22 0740  BP: (!) 155/71 (!) 158/82  Pulse: 73 76  Resp: 18 17  Temp: 97.8 F (36.6 C) 98.9 F (37.2 C)  SpO2: 95% 94%   No complaints other than continued RUQ pain Not bad at this time No more blood in stool, had several BM's yesterday Wife at bedside  General: No acute distress. Cardiovascular: irregularly irregular Lungs: Clear to auscultation bilaterally  Abdomen: mild RUQ TTP, staples in place Neurological: Alert and oriented 3. Moves all extremities 4 with equal strength. Cranial nerves II through XII grossly intact. Extremities: No clubbing or cyanosis. No edema.   Discharge Instructions   Discharge Instructions     (HEART FAILURE PATIENTS) Call MD:  Anytime you have any of the following symptoms: 1) 3 pound weight gain in 24 hours or 5 pounds in 1 week 2) shortness of breath, with or without Klaudia Beirne dry hacking cough 3) swelling in the hands, feet or stomach 4) if you have to sleep on extra pillows at night in order to breathe.   Complete by: As directed    Call MD for:  difficulty breathing, headache or visual disturbances   Complete by: As directed    Call MD for:  extreme fatigue   Complete by: As directed    Call MD for:  hives   Complete by: As directed    Call MD for:  persistant dizziness or light-headedness   Complete by: As directed    Call MD for:  persistant nausea and vomiting   Complete by: As directed    Call MD for:  redness, tenderness, or signs of infection (pain, swelling, redness, odor or green/yellow discharge around incision site)   Complete by: As directed    Call MD for:  severe uncontrolled pain   Complete by: As directed    Call MD for:  temperature >100.4   Complete by: As directed    Diet - low sodium heart healthy   Complete by: As directed    Discharge instructions   Complete by: As directed    You were seen for chest pain  and were thought to have demand ischemia due to your low blood counts.  You had Annison Birchard colonoscopy and Jaquail Mclees tumor was seen in the mid descending colon.  You've now had Leigha Olberding right colectomy with general surgery.  Pathology shows invasive moderately differentiated adenocarcinoma.  You have follow up with oncology scheduled on February 23rd at 1:40 PM.  Please follow up as scheduled.  You have Marye Eagen nurse visit with [facility] at 45 am on February 15th.  On march 1 you have Sada Mazzoni follow up with Dr. Marlou Starks at 2:20.  We believe your anemia is due to the iron deficiency related to your cancer.  Follow up with oncology regarding this.  We've restarted your eliquis.  Watch for signs of bleeding.  If you notice anymore blood in your stool, stop your eliquis and return to the hospital or call your doctor.  The pain in your right side should gradually improve.  If it's not improving or is worsening, call surgery for additional instructions or  return to the hospital.   Resume your home diabetes medicines.  We gave you less insulin here than you typically get at home.  It sounds like you've not quite regained your appetite.  Based on your fasting blood sugars in the hospital and your not normal diet, I would use half of the lantus and the novolog that you typically use as you return home.  As your appetite improves you can return to your normal regimen if your blood sugars aren't low.  Follow with your diabetes doctor for further instructions.  Watch for low blood sugars.   Return for new, recurrent, or worsening symptoms.  Please ask your PCP to request records from this hospitalization so they know what was done and what the next steps will be.   Increase activity slowly   Complete by: As directed    Type and screen   Complete by: As directed    [place]       Allergies as of 08/11/2022       Reactions   Loratadine Other (See Comments)   Not effective   Simvastatin Other (See Comments)    Intolerant- caused joint pain        Medication List     TAKE these medications    acetaminophen 325 MG tablet Commonly known as: TYLENOL Take 325-650 mg by mouth every 6 (six) hours as needed for mild pain or headache.   apixaban 5 MG Tabs tablet Commonly known as: ELIQUIS Take 1 tablet (5 mg total) by mouth 2 (two) times daily.   B-D SINGLE USE SWABS REGULAR Pads Use to check blood sugar 2 times Payden Docter day   CO Q 10 PO Take 200 mg by mouth daily.   Droplet Pen Needles 31G X 6 MM Misc Generic drug: Insulin Pen Needle USE ONE TIME DAILY  AT  BEDTIME  WITH  LANTUS   Flonase 50 MCG/ACT nasal spray Generic drug: fluticasone Place 1 spray into both nostrils daily as needed for allergies.   furosemide 20 MG tablet Commonly known as: LASIX Take 20 mg by mouth daily.   glipiZIDE 10 MG tablet Commonly known as: GLUCOTROL TAKE 1 TABLET TWICE DAILY WITH MEALS What changed: when to take this   isosorbide mononitrate 60 MG 24 hr tablet Commonly known as: IMDUR TAKE 1 TABLET EVERY DAY   Lantus SoloStar 100 UNIT/ML Solostar Pen Generic drug: insulin glargine Inject 50 Units into the skin at bedtime. What changed: how much to take   metFORMIN 1000 MG tablet Commonly known as: GLUCOPHAGE TAKE 1 TABLET TWICE DAILY WITH MEALS What changed: when to take this   metoprolol tartrate 25 MG tablet Commonly known as: LOPRESSOR Take 0.5 tablets (12.5 mg total) by mouth 2 (two) times daily.   nitroGLYCERIN 0.4 MG SL tablet Commonly known as: Nitrostat Place 1 tablet (0.4 mg total) under the tongue every 5 (five) minutes as needed for chest pain.   NovoLOG FlexPen 100 UNIT/ML FlexPen Generic drug: insulin aspart Inject 16 Units into the skin daily. Before dinner   rosuvastatin 40 MG tablet Commonly known as: CRESTOR Take 1 tablet (40 mg total) by mouth daily.   True Metrix Air Glucose Meter w/Device Kit 1 kit by Other route 2 (two) times daily. Check blood sugar twice daily  and as directed. Dx E11.65   True Metrix Blood Glucose Test test strip Generic drug: glucose blood CHECK BLOOD SUGAR TWICE DAILY   True Metrix Level 1 Low Soln Use to  check control on glucose meter   TRUEplus Lancets 30G Misc Check blood sugar twice daily and as directed. Dx E11.65   Vitamin B12 1000 MCG Tbcr Take 1,000 mcg by mouth daily.               Durable Medical Equipment  (From admission, onward)           Start     Ordered   08/07/22 1001  For home use only DME Bedside commode  Once       Question:  Patient needs Maryse Brierley bedside commode to treat with the following condition  Answer:  Weakness   08/07/22 1000   08/06/22 1608  For home use only DME Walker rolling  Once       Question Answer Comment  Walker: With [place]   Patient needs Salathiel Ferrara walker to treat with the following condition Weakness      08/06/22 1608           Allergies  Allergen Reactions   Loratadine Other (See Comments)    Not effective   Simvastatin Other (See Comments)    Intolerant- caused joint pain    Follow-up Information     [facility] Follow up.   Specialty: Home Health Services Why: home health has benn arranged. They will contact you to schedule apt within 48hrs post discharge. Contact information: [address] [place] [address] [phone]         [facility], [place]. Go on 08/16/2022.   Specialty: General Surgery Why: Your appointment is 2/15 at [place] with Velvie Thomaston nurse for staple removal, Arrive 50mn early to check in, fill out paperwork, BEngineer, civil (consulting)ID and insurance information [address]: [phone]       TJovita Kussmaul MD. Go on 08/31/2022.   Specialty: General Surgery Why: Your appointment is 3/1 at 2:20pm Please arrive 15 minutes early for check in. Contact information: [address] [phone]        SLadell Pier MD Follow up on 08/24/2022.   Specialty: Oncology Contact information: [phone]                 The results of significant diagnostics from this hospitalization (including imaging, microbiology, ancillary and laboratory) are listed below for reference.    Significant Diagnostic Studies: VAS [place]OWER EXTREMITY VENOUS (DVT)  Result Date: 08/09/2022  Lower Venous DVT Study Patient Name:  PFINLAY SALONEN Date of Exam:   08/08/2022 Medical Rec #: [id number]     Accession #:    [id number] of Birth: 8October 21, 1935     Patient Gender: M Patient Age:   89years [facility]:      VAS [place]OWER EXTREMITY VENOUS (DVT) Referring Phys: Baelynn Schmuhl POWELL JR --------------------------------------------------------------------------------  Indications: Swelling, and Edema. Other Indications: S/P partial colectomy. Performing Technologist: ROda CoganRDMS, RVT  Examination Guidelines: Shunda Rabadi complete evaluation includes B-mode imaging, spectral Doppler, color Doppler, and power Doppler as needed of all accessible portions of each vessel. Bilateral testing is considered an integral part of Leyda Vanderwerf complete examination. Limited examinations for reoccurring indications may be performed as noted. The reflux portion of the exam is performed with the patient in reverse Trendelenburg.  +---------+---------------+---------+-----------+----------+--------------+ RIGHT    CompressibilityPhasicitySpontaneityPropertiesThrombus Aging +---------+---------------+---------+-----------+----------+--------------+ CFV  Full           Yes      Yes                                 +---------+---------------+---------+-----------+----------+--------------+ SFJ      Full                                                         +---------+---------------+---------+-----------+----------+--------------+ FV Prox  Full                                                        +---------+---------------+---------+-----------+----------+--------------+ FV Mid   Full                                                        +---------+---------------+---------+-----------+----------+--------------+ FV DistalFull                                                        +---------+---------------+---------+-----------+----------+--------------+ PFV      Full                                                        +---------+---------------+---------+-----------+----------+--------------+ POP      Full           Yes      Yes                                 +---------+---------------+---------+-----------+----------+--------------+ PTV      Full                                                        +---------+---------------+---------+-----------+----------+--------------+ PERO     Full                                                        +---------+---------------+---------+-----------+----------+--------------+   +---------+---------------+---------+-----------+----------+--------------+ LEFT     CompressibilityPhasicitySpontaneityPropertiesThrombus Aging +---------+---------------+---------+-----------+----------+--------------+ CFV      Full           Yes      Yes                                 +---------+---------------+---------+-----------+----------+--------------+  SFJ      Full                                                        +---------+---------------+---------+-----------+----------+--------------+ FV Prox  Full                                                        +---------+---------------+---------+-----------+----------+--------------+ FV Mid   Full                                                         +---------+---------------+---------+-----------+----------+--------------+ FV DistalFull                                                        +---------+---------------+---------+-----------+----------+--------------+ PFV      Full                                                        +---------+---------------+---------+-----------+----------+--------------+ POP      Full           Yes      Yes                                 +---------+---------------+---------+-----------+----------+--------------+ PTV      Full                                                        +---------+---------------+---------+-----------+----------+--------------+ PERO     Full                                                        +---------+---------------+---------+-----------+----------+--------------+     Summary: BILATERAL: - No evidence of deep vein thrombosis seen in the lower extremities, bilaterally. -No evidence of popliteal cyst, bilaterally.   *See table(s) above for measurements and observations. Electronically signed by Jamelle Haring on 08/09/2022 at 2:49:54 PM.    Final    CT ABDOMEN PELVIS W CONTRAST  Result Date: 08/08/2022 CLINICAL DATA:  Status post laparoscopic colectomy on February 2nd for colon cancer. Abdominal pain. * Tracking Code: BO * EXAM: CT ABDOMEN AND PELVIS WITH CONTRAST TECHNIQUE: Multidetector CT imaging of the abdomen and pelvis was performed using the standard protocol following bolus administration of intravenous contrast. RADIATION DOSE REDUCTION: This  exam was performed according to the departmental dose-optimization program which includes automated exposure control, adjustment of the mA and/or kV according to patient size and/or use of iterative reconstruction technique. CONTRAST:  60m OMNIPAQUE IOHEXOL 350 MG/ML SOLN COMPARISON:  07/31/2022 chest abdomen and pelvic CTs. FINDINGS: Lower chest: Right greater than left base dependent compressive atelectasis.  Right lower lobe calcified granuloma. Mild cardiomegaly with multivessel coronary artery calcification. Tiny bilateral pleural effusions. Hepatobiliary: Normal liver. Gallstones up to 6 mm without acute cholecystitis or biliary duct dilatation. Pancreas: Normal, without mass or ductal dilatation. Spleen: Normal in size, without focal abnormality. Adrenals/Urinary Tract: Normal adrenal glands. Bilateral renal cysts including up to 12.1 cm. Upper pole right renal 2.5 cm lesion has been evaluated previously on dedicated imaging (11/8 today). No hydronephrosis. Normal urinary bladder. Stomach/Bowel: Normal stomach, without wall thickening. Descending duodenal diverticulum is tiny. Otherwise normal small bowel. Scattered colonic diverticula. Status post partial right hemicolectomy. Vascular/Lymphatic: Aortic atherosclerosis. No abdominopelvic adenopathy. Reproductive: Mild prostatomegaly. Other: Trace fluid within the pelvic cul-de-sac is new. There is also ill-defined small volume right perihepatic fluid, just lateral to the surgical sutures. Example at 4.9 x 1.4 cm on 41/3. 6.2 cm craniocaudal on 58/6. No surrounding enhancement or gas within. No intra-abdominal/pelvic extraluminal gas. There is trace gas within the anterior abdominal wall which is presumably postoperative in the setting of midline laparotomy staples. Musculoskeletal: No acute osseous abnormality. IMPRESSION: 1. Status post partial right hemicolectomy, without acute postoperative complication. 2. Small volume right perihepatic fluid, just lateral to the surgical site. Likely an area of loculated ascites. No abnormal enhancement or gas within to suggest abscess. 3. Tiny bilateral pleural effusions and bibasilar atelectasis. Trace cul-de-sac fluid. 4. Cholelithiasis 5. Upper pole right renal lesion has been detailed on dedicated imaging as suspicious for renal cell carcinoma. 6. Coronary artery atherosclerosis. Aortic Atherosclerosis (ICD10-I70.0).  Electronically Signed   By: KAbigail MiyamotoM.D.   On: 08/08/2022 12:02   DG CHEST PORT 1 VIEW  Result Date: 08/08/2022 CLINICAL DATA:  Chest pain. EXAM: PORTABLE CHEST 1 VIEW COMPARISON:  CT chest 07/31/2022 and chest radiograph 07/30/2022. FINDINGS: Trachea is midline. Heart is enlarged, stable. Lungs are somewhat low in volume with mild bibasilar interstitial prominence and indistinctness. Tiny bilateral pleural effusions. IMPRESSION: Tiny bilateral effusions with suspected mild pulmonary edema. Electronically Signed   By: MLorin PicketM.D.   On: 08/08/2022 09:11   ECHOCARDIOGRAM COMPLETE  Result Date: 07/31/2022    ECHOCARDIOGRAM REPORT   Patient Name:   PKARAC LOOKABILLDate of Exam: 07/31/2022 Medical Rec #:  [id number]    Height:       71.0 in Accession #:    [id number]   Weight:       257.5 lb Date of Birth:  825-Feb-1935    BSA:          2.348 m Patient Age:    89years      BP:           151/68 mmHg Patient Gender: M             HR:           64 bpm. Exam Location:  Inpatient Procedure: 2D Echo, Cardiac Doppler and Color Doppler                       STAT ECHO Reported to: Dr WEleonore Chiquitoon 07/31/2022 8:06:00 AM. Indications:    NSTEMI  History:  Patient has prior history of Echocardiogram examinations, most                 recent 10/14/2020. Previous Myocardial Infarction,                 Arrythmias:Atrial Fibrillation; Risk Factors:Diabetes,                 Hypertension and Sleep Apnea.  Sonographer:    Clayton Lefort RDCS (AE) Referring Phys: [id number] [place]  1. Left ventricular ejection fraction, by estimation, is 60 to 65%. The left ventricle has normal function. The left ventricle has no regional wall motion abnormalities. There is mild concentric left ventricular hypertrophy. Left ventricular diastolic function could not be evaluated.  2. Right ventricular systolic function is normal. The right ventricular size is normal. There is mildly elevated pulmonary artery systolic  pressure. The estimated right ventricular systolic pressure is 123XX123 mmHg.  3. The mitral valve is grossly normal. No evidence of mitral valve regurgitation. No evidence of mitral stenosis.  4. The aortic valve is tricuspid. There is mild calcification of the aortic valve. There is mild thickening of the aortic valve. Aortic valve regurgitation is not visualized. Aortic valve sclerosis/calcification is present, without any evidence of aortic stenosis.  5. The inferior vena cava is normal in size with greater than 50% respiratory variability, suggesting right atrial pressure of 3 mmHg. Comparison(s): No significant change from prior study. FINDINGS  Left Ventricle: Left ventricular ejection fraction, by estimation, is 60 to 65%. The left ventricle has normal function. The left ventricle has no regional wall motion abnormalities. The left ventricular internal cavity size was normal in size. There is  mild concentric left ventricular hypertrophy. Left ventricular diastolic function could not be evaluated due to atrial fibrillation. Left ventricular diastolic function could not be evaluated. Right Ventricle: The right ventricular size is normal. No increase in right ventricular wall thickness. Right ventricular systolic function is normal. There is mildly elevated pulmonary artery systolic pressure. The tricuspid regurgitant velocity is 2.90  m/s, and with an assumed right atrial pressure of 3 mmHg, the estimated right ventricular systolic pressure is 123XX123 mmHg. Left Atrium: Left atrial size was normal in size. Right Atrium: Right atrial size was normal in size. Pericardium: There is no evidence of pericardial effusion. Mitral Valve: The mitral valve is grossly normal. No evidence of mitral valve regurgitation. No evidence of mitral valve stenosis. Tricuspid Valve: The tricuspid valve is grossly normal. Tricuspid valve regurgitation is mild . No evidence of tricuspid stenosis. Aortic Valve: The aortic valve is tricuspid.  There is mild calcification of the aortic valve. There is mild thickening of the aortic valve. Aortic valve regurgitation is not visualized. Aortic valve sclerosis/calcification is present, without any evidence of aortic stenosis. Aortic valve mean gradient measures 5.0 mmHg. Aortic valve peak gradient measures 9.7 mmHg. Aortic valve area, by VTI measures 2.75 cm. Pulmonic Valve: The pulmonic valve was grossly normal. Pulmonic valve regurgitation is not visualized. No evidence of pulmonic stenosis. Aorta: The aortic root and ascending aorta are structurally normal, with no evidence of dilitation. Venous: The inferior vena cava is normal in size with greater than 50% respiratory variability, suggesting right atrial pressure of 3 mmHg. IAS/Shunts: The atrial septum is grossly normal.  LEFT VENTRICLE PLAX 2D LVIDd:         4.80 cm LVIDs:         3.10 cm LV PW:         1.10  cm LV IVS:        1.20 cm LVOT diam:     2.10 cm LV SV:         93 LV SV Index:   40 LVOT Area:     3.46 cm  RIGHT VENTRICLE             IVC RV Basal diam:  3.20 cm     IVC diam: 1.40 cm RV S prime:     13.90 cm/s TAPSE (M-mode): 2.7 cm LEFT ATRIUM              Index        RIGHT ATRIUM           Index LA diam:        3.90 cm  1.66 cm/m   RA Area:     21.60 cm LA Vol (A2C):   104.0 ml 44.30 ml/m  RA Volume:   59.20 ml  25.22 ml/m LA Vol (A4C):   70.2 ml  29.90 ml/m LA Biplane Vol: 88.7 ml  37.78 ml/m  AORTIC VALVE AV Area (Vmax):    2.86 cm AV Area (Vmean):   2.80 cm AV Area (VTI):     2.75 cm AV Vmax:           156.00 cm/s AV Vmean:          105.000 cm/s AV VTI:            0.338 m AV Peak Grad:      9.7 mmHg AV Mean Grad:      5.0 mmHg LVOT Vmax:         129.00 cm/s LVOT Vmean:        84.900 cm/s LVOT VTI:          0.268 m LVOT/AV VTI ratio: 0.79  AORTA Ao Root diam: 3.40 cm Ao Asc diam:  3.60 cm TRICUSPID VALVE TR Peak grad:   33.6 mmHg TR Vmax:        290.00 cm/s  SHUNTS Systemic VTI:  0.27 m Systemic Diam: 2.10 cm Eleonore Chiquito MD  Electronically signed by Eleonore Chiquito MD Signature Date/Time: 07/31/2022/8:20:37 AM    Final    CT CHEST ABDOMEN PELVIS W CONTRAST  Result Date: 07/31/2022 CLINICAL DATA:  Chest pain, known right renal mass, evaluate for metastatic disease EXAM: CT CHEST, ABDOMEN, AND PELVIS WITH CONTRAST TECHNIQUE: Multidetector CT imaging of the chest, abdomen and pelvis was performed following the standard protocol during bolus administration of intravenous contrast. RADIATION DOSE REDUCTION: This exam was performed according to the departmental dose-optimization program which includes automated exposure control, adjustment of the mA and/or kV according to patient size and/or use of iterative reconstruction technique. CONTRAST:  36m OMNIPAQUE IOHEXOL 350 MG/ML SOLN COMPARISON:  MRI abdomen dated 08/14/2021. CTA chest dated 10/12/2020. FINDINGS: CT CHEST FINDINGS Cardiovascular: Heart is normal in size.  No pericardial effusion. No evidence of thoracic aortic aneurysm. Atherosclerotic calcifications of the aortic arch. Severe three-vessel coronary atherosclerosis. Mediastinum/Nodes: 10 mm short axis right azygoesophageal recess node, previously 12 mm. Additional calcified right perihilar nodes. No suspicious mediastinal lymphadenopathy. Visualized thyroid is unremarkable. Lungs/Pleura: No suspicious pulmonary nodules. Calcified granuloma in the right lower lobe, benign. Mild subpleural reticulation/scarring in the lungs bilaterally, lower lobe predominant. No pleural effusion or pneumothorax. Musculoskeletal: Moderate degenerative changes of the thoracic spine. No focal osseous lesions. CT ABDOMEN PELVIS FINDINGS Hepatobiliary: Liver is within normal limits. No suspicious/enhancing hepatic lesions. Layering small gallstones (series 3/image 66), without  associated inflammatory changes. No intrahepatic or extrahepatic duct dilatation. Pancreas: Within normal limits. Spleen: Within normal limits. Adrenals/Urinary Tract: Adrenal  glands are within normal limits. Multiple simple bilateral renal cysts, measuring up to 14.8 cm in the left upper kidney (series 3/image 68), benign (Bosniak I). No follow-up is recommended. 2.5 x 2.6 cm enhancing lesion in the anterior right upper kidney (series 3/image 70), grossly unchanged from prior MRI, compatible with solid renal neoplasm such as renal cell carcinoma. No hydronephrosis. Bladder is underdistended but unremarkable. Stomach/Bowel: Stomach is notable for Graziella Connery tiny hiatal hernia. No evidence of bowel obstruction. Normal appendix (series 3/image 98). Sigmoid diverticulosis, without evidence of diverticulitis. Vascular/Lymphatic: No evidence of abdominal aortic aneurysm. Atherosclerotic calcifications of the abdominal aorta and branch vessels. No suspicious abdominopelvic lymphadenopathy. Reproductive: Prostate is unremarkable. Other: No abdominopelvic ascites. Musculoskeletal: Mild anterior wedging at L1, chronic. Degenerative changes of the lumbar spine. No focal osseous lesions. IMPRESSION: 2.6 cm enhancing lesion in the anterior right upper kidney, grossly unchanged from prior MRI, compatible with solid renal neoplasm such as renal cell carcinoma. No findings suspicious for metastatic disease. Cholelithiasis, without associated inflammatory changes. Additional ancillary findings as above. Electronically Signed   By: Julian Hy M.D.   On: 07/31/2022 02:13   DG Chest 2 View  Result Date: 07/30/2022 CLINICAL DATA:  Chest pain EXAM: CHEST - 2 VIEW COMPARISON:  10/11/2020 FINDINGS: Cardiac silhouette enlarged. No evidence of pneumothorax or pleural effusion. Pulmonary vascular congestion. No evidence of pulmonary edema. No focal consolidation. Aorta is calcified. There are thoracic degenerative changes. IMPRESSION: Enlarged cardiac silhouette.  Pulmonary vascular congestion. Electronically Signed   By: Sammie Bench M.D.   On: 07/30/2022 17:57    Microbiology: Recent Results (from the  past 240 hour(s))  Culture, blood (Routine X 2) w Reflex to ID Panel     Status: None (Preliminary result)   Collection Time: 08/08/22  2:49 PM   Specimen: BLOOD  Result Value Ref Range Status   Specimen Description BLOOD RIGHT ANTECUBITAL  Final   Special Requests   Final    BOTTLES DRAWN AEROBIC AND ANAEROBIC Blood Culture adequate volume   Culture   Final    NO GROWTH 3 DAYS Performed at [facility], [address], [place], [place] [address]    Report Status PENDING  Incomplete  Culture, blood (Routine X 2) w Reflex to ID Panel     Status: None (Preliminary result)   Collection Time: 08/08/22  2:50 PM   Specimen: BLOOD  Result Value Ref Range Status   Specimen Description BLOOD RIGHT ANTECUBITAL  Final   Special Requests   Final    BOTTLES DRAWN AEROBIC AND ANAEROBIC Blood Culture adequate volume   Culture   Final    NO GROWTH 3 DAYS Performed at [facility], [place] [address] [place], [place] [address]    Report Status PENDING  Incomplete     Labs: Basic Metabolic Panel: Recent Labs  Lab 08/05/22 0030 08/06/22 0026 08/07/22 0026 08/08/22 0741 08/09/22 0019 08/10/22 0652 08/11/22 0019  NA 139   < > 137 138 134* 135 137  K 3.7   < > 3.3* 4.2 3.5 3.5 3.6  CL 112*   < > 109 106 102 98 102  CO2 21*   < > 22 21* 23 24 25  $ GLUCOSE 164*   < > 211* 208* 211* 181* 192*  BUN 16   < > 11 10 12 13 16  $ CREATININE 1.25*   < >  1.26* 1.32* 1.28* 1.14 1.26*  CALCIUM 8.3*   < > 8.0* 8.4* 8.0* 8.1* 8.0*  MG 1.9  --   --  1.8 1.8 2.0 2.2  PHOS  --   --   --   --  2.1* 2.9 2.8   < > = values in this interval not displayed.   Liver Function Tests: Recent Labs  Lab 08/05/22 0030 08/09/22 0019  AST 13* 19  ALT 13 30  ALKPHOS 67 96  BILITOT 0.8 0.6  PROT 5.7* 5.5*  ALBUMIN 2.6* 2.1*   No results for input(s): "LIPASE", "AMYLASE" in the last 168 hours. No results for input(s): "AMMONIA" in the last 168 hours. CBC: Recent Labs  Lab 08/05/22 0030 08/06/22 0026  08/07/22 0026 08/08/22 0741 08/08/22 1449 08/09/22 0019 08/10/22 0652 08/11/22 0019  WBC 10.0 7.9 8.2 15.3*  --  13.2* 10.4 8.5  NEUTROABS 8.2* 6.3 6.2  --   --  10.3*  --   --   HGB 8.1* 8.0* 8.1* 10.1* 9.0* 8.8* 8.8* 8.7*  HCT 27.4* 25.6* 25.9* 33.2* 29.3* 29.2* 29.5* 28.9*  MCV 84.0 82.3 81.7 82.2  --  83.9 83.6 83.5  PLT 194 175 186 242  --  206 195 206   Cardiac Enzymes: No results for input(s): "CKTOTAL", "CKMB", "CKMBINDEX", "TROPONINI" in the last 168 hours. BNP: BNP (last 3 results) Recent Labs    07/31/22 0220 08/08/22 0741  BNP 122.8* 292.0*    ProBNP (last 3 results) No results for input(s): "PROBNP" in the last 8760 hours.  CBG: Recent Labs  Lab 08/10/22 0549 08/10/22 1103 08/10/22 1552 08/10/22 2052 08/11/22 0633  GLUCAP 184* 367* 324* 255* 254*       Signed:  Fayrene Helper MD.  [facility] 08/11/2022, 10:15 AM

## 2022-08-11 NOTE — Progress Notes (Addendum)
   Palliative Medicine Inpatient Follow Up Note HPI: 89 y.o. male   admitted on 07/30/2022 with severe CAD, 3 vessels, remote PCI, hypertension, probable right kidney RCC on surveillance, gallstones, A-fib on Eliquis, sleep apnea admitted with symptomatic heme positive IDA. Remote colon polyps and newly identified fungating mass. NSTEMI presentation felt secondary to demand in the setting of profound anemia. Patient and his family face treatment option decisions,  advanced directive decisions and anticipatory care needs.  Today's Discussion 08/11/2022  *Please note that this is a verbal dictation therefore any spelling or grammatical errors are due to the "[place] One" system interpretation.  Chart reviewed inclusive of vital signs, progress notes, laboratory results, and diagnostic images.  H/H stable. VSS. Last BM 2/8.  I met with Ryan Garcia this afternoon. He is resting comfortably in NAD. He shares that he had some pain in his stomach after got out of bed and he ate breakfast though he was lying back in bed at the time of assessment and shared his pain had passed.   We reviewed the hope for Janet to discharge home today per primary team which he is in agreement with.   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 She is aware of the plan for OP oncology follow up with Dr. Ammie Dalton.  ______________________________________ Addendum:  I spoke to patients wife again in the late morning. She is agreeable towards OP Palliative clinic referral.   Questions and concerns addressed/Palliative Support Provided.   Objective Assessment: Vital Signs Vitals:   08/11/22 0439 08/11/22 0740  BP: (!) 155/71 (!) 158/82  Pulse: 73 76  Resp: 18 17  Temp: 97.8 F (36.6 C) 98.9 F (37.2 C)  SpO2: 95% 94%    Intake/Output Summary (Last 24 hours) at 08/11/2022 1017 Last data filed at 08/10/2022  2100 Gross per 24 hour  Intake 240 ml  Output 477 ml  Net -237 ml    Last Weight  Most recent update: 08/10/2022  5:54 AM    Weight  108.9 kg (240 lb 1.3 oz)            Gen: Elderly caucasian M in NAD HEENT: moist mucous membranes CV: Regular rate and irregular rhythm  PULM: On RA, breathing is even and nonlabored ABD: soft/nontender EXT: No edema Neuro: Alert and oriented x3 - hard of hearing  SUMMARY OF RECOMMENDATIONS   DNAR/DNI  OP Oncology FU  Will refer to Palliative Care Symptom Management clinic  Plan for discharge today per primary  Billing based on MDM: Moderate ______________________________________________________________________________________ [place] Team Team Cell Phone: [phone] Please utilize secure chat with additional questions, if there is no response within 30 minutes please call the above phone number  Palliative Medicine Team providers are available by phone from 7am to 7pm daily and can be reached through the team cell phone.  Should this patient require assistance outside of these hours, please call the patient's attending physician.

## 2022-08-11 NOTE — TOC Transition Note (Signed)
Transition of Care [facility]) - CM/SW Discharge Note   Patient Details  Name: Ryan Garcia MRN: [id number] Date of Birth: [birth year]  [facility]) CM/SW Contact:  Carles Collet, RN Phone Number: 08/11/2022, 11:36 AM   Clinical Narrative:     Notified bayada of DC today. DME set up on 2/6, already delivered to room. No other toc needs identified    Barriers to Discharge: Continued Medical Work up   Patient Goals and CMS Choice CMS Medicare.gov Compare Post Acute Care list provided to:: Patient Represenative (must comment) Choice offered to / list presented to : Spouse  Discharge Placement                         Discharge Plan and Services Additional resources added to the After Visit Summary for     Discharge Planning Services: CM Consult Post Acute Care Choice: Durable Medical Equipment, Home Health          DME Arranged: Bedside commode, Walker rolling DME Agency: [employer] Date DME Agency Contacted: 08/07/22 Time DME Agency Contacted: [id number] Representative spoke with at DME Agency: [place]: PT [place]: [place] Date [place]: 08/07/22 Time [place]: 1008 Representative spoke with at [place]: [place] Determinants of Health ([place]) Interventions SDOH Screenings   Food Insecurity: No Food Insecurity (07/31/2022)  Housing: Low Risk  (07/31/2022)  Transportation Needs: No Transportation Needs (07/31/2022)  Utilities: Not At Risk (07/31/2022)  Alcohol Screen: Low Risk  (06/29/2021)  Depression (PHQ2-9): Low Risk  (07/05/2022)  Financial Resource Strain: Low Risk  (06/29/2021)  Physical Activity: Inactive (06/29/2021)  Social Connections: Moderately Integrated (06/29/2021)  Stress: No Stress Concern Present (06/29/2021)  Tobacco Use: Low Risk  (08/04/2022)     Readmission Risk Interventions     No data to display

## 2022-08-13 ENCOUNTER — Telehealth: Payer: Self-pay | Admitting: *Deleted

## 2022-08-13 DIAGNOSIS — I21A1 Myocardial infarction type 2: Secondary | ICD-10-CM | POA: Diagnosis not present

## 2022-08-13 DIAGNOSIS — D630 Anemia in neoplastic disease: Secondary | ICD-10-CM | POA: Diagnosis not present

## 2022-08-13 DIAGNOSIS — D127 Benign neoplasm of rectosigmoid junction: Secondary | ICD-10-CM | POA: Diagnosis not present

## 2022-08-13 DIAGNOSIS — Z483 Aftercare following surgery for neoplasm: Secondary | ICD-10-CM | POA: Diagnosis not present

## 2022-08-13 DIAGNOSIS — I2510 Atherosclerotic heart disease of native coronary artery without angina pectoris: Secondary | ICD-10-CM | POA: Diagnosis not present

## 2022-08-13 DIAGNOSIS — D123 Benign neoplasm of transverse colon: Secondary | ICD-10-CM | POA: Diagnosis not present

## 2022-08-13 DIAGNOSIS — C186 Malignant neoplasm of descending colon: Secondary | ICD-10-CM | POA: Diagnosis not present

## 2022-08-13 DIAGNOSIS — I480 Paroxysmal atrial fibrillation: Secondary | ICD-10-CM | POA: Diagnosis not present

## 2022-08-13 DIAGNOSIS — C182 Malignant neoplasm of ascending colon: Secondary | ICD-10-CM | POA: Diagnosis not present

## 2022-08-13 LAB — CULTURE, BLOOD (ROUTINE X 2)
Culture: NO GROWTH
Culture: NO GROWTH
Special Requests: ADEQUATE
Special Requests: ADEQUATE

## 2022-08-13 NOTE — Transitions of Care (Post Inpatient/ED Visit) (Signed)
   08/13/2022  Name: Ryan Garcia MRN: [id number] DOB: 1933/08/30  Today's TOC FU Call Status: Today's TOC FU Call Status:: Unsuccessul Call (1st Attempt) Unsuccessful Call (1st Attempt) Date: 08/13/22  Attempted to reach the patient regarding the most recent Inpatient/ED visit.  Follow Up Plan: Additional outreach attempts will be made to reach the patient to complete the Transitions of Care (Post Inpatient/ED visit) call.   [place] [facility] [phone]

## 2022-08-14 LAB — SURGICAL PATHOLOGY

## 2022-08-15 ENCOUNTER — Telehealth: Payer: Self-pay | Admitting: *Deleted

## 2022-08-15 ENCOUNTER — Other Ambulatory Visit: Payer: Self-pay | Admitting: Family Medicine

## 2022-08-15 ENCOUNTER — Telehealth: Payer: Self-pay | Admitting: Family Medicine

## 2022-08-15 NOTE — Transitions of Care (Post Inpatient/ED Visit) (Signed)
   08/15/2022  Name: Ryan Garcia MRN: [id number] DOB: 02/01/1934  Today's TOC FU Call Status: Today's TOC FU Call Status:: Successful TOC FU Call Competed TOC FU Call Complete Date: 08/15/22  Transition Care Management Follow-up Telephone Call Date of Discharge: 08/11/22 Discharge Facility: [facility] Type of Discharge: Inpatient Admission Primary Inpatient Discharge Diagnosis:: NSTEMI, Malignant neoplasm of colon How have you been since you were released from the hospital?: Better Any questions or concerns?: Yes Patient Questions/Concerns:: Wife was worried about the lymph nodes and cancer Patient Questions/Concerns Addressed: Other: (RN discussed with patient wife about talking with onologist)  Items Reviewed: Did you receive and understand the discharge instructions provided?: Yes Medications obtained and verified?: Yes (Medications Reviewed) Any new allergies since your discharge?: No Dietary orders reviewed?: Yes Type of Diet Ordered:: diabetic heart healthy Do you have support at home?: Yes People in Home: spouse Name of Support/Comfort Primary Source: [facility] and Equipment/Supplies: [place] Ordered?: Yes Name of [place]:: [name] Has Agency set up a time to come to your home?: Yes [place] Visit Date: 08/14/22 Any new equipment or medical supplies ordered?: No  Functional Questionnaire: Do you need assistance with bathing/showering or dressing?: No Do you need assistance with meal preparation?: Yes Do you need assistance with eating?: No Do you have difficulty maintaining continence: No Do you need assistance with getting out of bed/getting out of a chair/moving?: No Do you have difficulty managing or taking your medications?: No  Folllow up appointments reviewed: PCP Follow-up appointment confirmed?: Yes Date of PCP follow-up appointment?: 08/16/22 Follow-up Provider: Dr Glori Bickers [facility] Follow-up appointment  confirmed?: Yes Date of Specialist follow-up appointment?: 08/24/22 Follow-Up Specialty Provider::  Jovita Kussmaul, MD (General Surgery) on 08/31/2022; t 2:20pm  Ladell Pier, MD (Oncology) on 08/24/2022 1:40 Do you need transportation to your follow-up appointment?: No Do you understand care options if your condition(s) worsen?: Yes-patient verbalized understanding  SDOH Interventions Today    Flowsheet Row Most Recent Value  SDOH Interventions   Food Insecurity Interventions Intervention Not Indicated  Housing Interventions Intervention Not Indicated  Transportation Interventions Intervention Not Indicated       Interventions Today    Flowsheet Row Most Recent Value  Nutrition Interventions   Nutrition Discussed/Reviewed Nutrition Discussed, Nutrition Reviewed  Pharmacy Interventions   Pharmacy Dicussed/Reviewed Medications and their functions       [place] Management [phone]

## 2022-08-15 NOTE — Telephone Encounter (Signed)
VO given to Ryan Garcia

## 2022-08-15 NOTE — Telephone Encounter (Signed)
Home Health verbal orders Caller Name: Rosalia Name: [name] [facility]  Callback number: [phone] (secure line)  Requesting PT  Frequency: 2x a week for 1 week/ 1x a week for 4 weeks  Please forward to [facility] pool or providers CMA

## 2022-08-15 NOTE — Telephone Encounter (Signed)
Please ok those verbal orders  

## 2022-08-15 NOTE — Transitions of Care (Post Inpatient/ED Visit) (Signed)
   08/15/2022  Name: Ryan Garcia MRN: [id number] DOB: [birth year]  Today's TOC FU Call Status: Today's TOC FU Call Status:: Unsuccessful Call (2nd Attempt) Unsuccessful Call (1st Attempt) Date: 08/15/22  Attempted to reach the patient regarding the most recent Inpatient/ED visit.  Follow Up Plan: Additional outreach attempts will be made to reach the patient to complete the Transitions of Care (Post Inpatient/ED visit) call.   [place] Care Management [phone]

## 2022-08-16 ENCOUNTER — Encounter: Payer: Self-pay | Admitting: Family Medicine

## 2022-08-17 ENCOUNTER — Encounter
Admission: EM | Disposition: A | Discharge status: Home / Self Care | Payer: Self-pay | Source: Home / Self Care | Attending: Internal Medicine

## 2022-08-17 ENCOUNTER — Inpatient Hospital Stay
Admission: EM | Admit: 2022-08-17 | Discharge: 2022-08-19 | DRG: 322 | Disposition: A | Discharge status: Home / Self Care | Payer: Medicare HMO | Attending: Internal Medicine | Admitting: Internal Medicine

## 2022-08-17 ENCOUNTER — Telehealth: Payer: Self-pay | Admitting: Family Medicine

## 2022-08-17 ENCOUNTER — Encounter: Payer: Self-pay | Admitting: Family Medicine

## 2022-08-17 ENCOUNTER — Ambulatory Visit (INDEPENDENT_AMBULATORY_CARE_PROVIDER_SITE_OTHER): Payer: Medicare HMO | Admitting: Family Medicine

## 2022-08-17 VITALS — BP 136/58 | HR 67 | Temp 97.8°F | Ht 71.0 in | Wt 238.2 lb

## 2022-08-17 DIAGNOSIS — Z66 Do not resuscitate: Secondary | ICD-10-CM | POA: Diagnosis not present

## 2022-08-17 DIAGNOSIS — I213 ST elevation (STEMI) myocardial infarction of unspecified site: Secondary | ICD-10-CM | POA: Diagnosis not present

## 2022-08-17 DIAGNOSIS — G4733 Obstructive sleep apnea (adult) (pediatric): Secondary | ICD-10-CM | POA: Diagnosis present

## 2022-08-17 DIAGNOSIS — D649 Anemia, unspecified: Secondary | ICD-10-CM | POA: Diagnosis not present

## 2022-08-17 DIAGNOSIS — Z85528 Personal history of other malignant neoplasm of kidney: Secondary | ICD-10-CM

## 2022-08-17 DIAGNOSIS — I2510 Atherosclerotic heart disease of native coronary artery without angina pectoris: Secondary | ICD-10-CM | POA: Diagnosis present

## 2022-08-17 DIAGNOSIS — Z794 Long term (current) use of insulin: Secondary | ICD-10-CM

## 2022-08-17 DIAGNOSIS — Z8249 Family history of ischemic heart disease and other diseases of the circulatory system: Secondary | ICD-10-CM

## 2022-08-17 DIAGNOSIS — I480 Paroxysmal atrial fibrillation: Secondary | ICD-10-CM | POA: Diagnosis present

## 2022-08-17 DIAGNOSIS — D638 Anemia in other chronic diseases classified elsewhere: Secondary | ICD-10-CM | POA: Diagnosis not present

## 2022-08-17 DIAGNOSIS — Z7984 Long term (current) use of oral hypoglycemic drugs: Secondary | ICD-10-CM

## 2022-08-17 DIAGNOSIS — E785 Hyperlipidemia, unspecified: Secondary | ICD-10-CM | POA: Diagnosis present

## 2022-08-17 DIAGNOSIS — K219 Gastro-esophageal reflux disease without esophagitis: Secondary | ICD-10-CM | POA: Diagnosis not present

## 2022-08-17 DIAGNOSIS — Z79899 Other long term (current) drug therapy: Secondary | ICD-10-CM

## 2022-08-17 DIAGNOSIS — I2119 ST elevation (STEMI) myocardial infarction involving other coronary artery of inferior wall: Secondary | ICD-10-CM | POA: Diagnosis not present

## 2022-08-17 DIAGNOSIS — I10 Essential (primary) hypertension: Secondary | ICD-10-CM

## 2022-08-17 DIAGNOSIS — I2111 ST elevation (STEMI) myocardial infarction involving right coronary artery: Secondary | ICD-10-CM | POA: Diagnosis not present

## 2022-08-17 DIAGNOSIS — N400 Enlarged prostate without lower urinary tract symptoms: Secondary | ICD-10-CM | POA: Diagnosis present

## 2022-08-17 DIAGNOSIS — Z6832 Body mass index (BMI) 32.0-32.9, adult: Secondary | ICD-10-CM

## 2022-08-17 DIAGNOSIS — E038 Other specified hypothyroidism: Secondary | ICD-10-CM | POA: Diagnosis not present

## 2022-08-17 DIAGNOSIS — C182 Malignant neoplasm of ascending colon: Secondary | ICD-10-CM

## 2022-08-17 DIAGNOSIS — I25110 Atherosclerotic heart disease of native coronary artery with unstable angina pectoris: Secondary | ICD-10-CM | POA: Diagnosis not present

## 2022-08-17 DIAGNOSIS — R0789 Other chest pain: Secondary | ICD-10-CM | POA: Diagnosis not present

## 2022-08-17 DIAGNOSIS — N179 Acute kidney failure, unspecified: Secondary | ICD-10-CM | POA: Diagnosis present

## 2022-08-17 DIAGNOSIS — Z7901 Long term (current) use of anticoagulants: Secondary | ICD-10-CM

## 2022-08-17 DIAGNOSIS — N2889 Other specified disorders of kidney and ureter: Secondary | ICD-10-CM

## 2022-08-17 DIAGNOSIS — I110 Hypertensive heart disease with heart failure: Secondary | ICD-10-CM | POA: Diagnosis not present

## 2022-08-17 DIAGNOSIS — Z955 Presence of coronary angioplasty implant and graft: Secondary | ICD-10-CM | POA: Diagnosis not present

## 2022-08-17 DIAGNOSIS — I4892 Unspecified atrial flutter: Secondary | ICD-10-CM | POA: Diagnosis present

## 2022-08-17 DIAGNOSIS — E669 Obesity, unspecified: Secondary | ICD-10-CM | POA: Diagnosis present

## 2022-08-17 DIAGNOSIS — R739 Hyperglycemia, unspecified: Secondary | ICD-10-CM | POA: Diagnosis not present

## 2022-08-17 DIAGNOSIS — I252 Old myocardial infarction: Secondary | ICD-10-CM

## 2022-08-17 DIAGNOSIS — J309 Allergic rhinitis, unspecified: Secondary | ICD-10-CM | POA: Diagnosis present

## 2022-08-17 DIAGNOSIS — M1990 Unspecified osteoarthritis, unspecified site: Secondary | ICD-10-CM | POA: Diagnosis present

## 2022-08-17 DIAGNOSIS — Z85828 Personal history of other malignant neoplasm of skin: Secondary | ICD-10-CM

## 2022-08-17 DIAGNOSIS — E878 Other disorders of electrolyte and fluid balance, not elsewhere classified: Secondary | ICD-10-CM | POA: Diagnosis present

## 2022-08-17 DIAGNOSIS — E119 Type 2 diabetes mellitus without complications: Secondary | ICD-10-CM | POA: Diagnosis present

## 2022-08-17 DIAGNOSIS — Z9049 Acquired absence of other specified parts of digestive tract: Secondary | ICD-10-CM | POA: Diagnosis not present

## 2022-08-17 HISTORY — PX: CORONARY/GRAFT ACUTE MI REVASCULARIZATION: CATH118305

## 2022-08-17 HISTORY — PX: LEFT HEART CATH AND CORONARY ANGIOGRAPHY: CATH118249

## 2022-08-17 LAB — GLUCOSE, CAPILLARY: Glucose-Capillary: 183 mg/dL — ABNORMAL HIGH (ref 70–99)

## 2022-08-17 LAB — TYPE AND SCREEN
ABO/RH(D): O NEG
Antibody Screen: NEGATIVE

## 2022-08-17 LAB — POCT ACTIVATED CLOTTING TIME: Activated Clotting Time: 553 seconds

## 2022-08-17 LAB — MRSA NEXT GEN BY PCR, NASAL: MRSA by PCR Next Gen: NOT DETECTED

## 2022-08-17 SURGERY — CORONARY/GRAFT ACUTE MI REVASCULARIZATION
Anesthesia: Moderate Sedation

## 2022-08-17 MED ORDER — ACETAMINOPHEN 325 MG PO TABS: 650.0000 mg | ORAL_TABLET | ORAL | Status: DC | PRN

## 2022-08-17 MED ORDER — MIDAZOLAM HCL 2 MG/2ML IJ SOLN
INTRAMUSCULAR | Status: AC
  Filled 2022-08-17: qty 2

## 2022-08-17 MED ORDER — LIDOCAINE HCL (PF) 1 % IJ SOLN
INTRAMUSCULAR | Status: DC | PRN
  Administered 2022-08-17: 8 mL

## 2022-08-17 MED ORDER — FENTANYL CITRATE (PF) 100 MCG/2ML IJ SOLN
INTRAMUSCULAR | Status: DC | PRN
  Administered 2022-08-17: 25 ug via INTRAVENOUS

## 2022-08-17 MED ORDER — IOHEXOL 300 MG/ML  SOLN
INTRAMUSCULAR | Status: DC | PRN
  Administered 2022-08-17: 156 mL

## 2022-08-17 MED ORDER — HEPARIN SODIUM (PORCINE) 1000 UNIT/ML IJ SOLN
6000.0000 [IU] | Freq: Once | INTRAMUSCULAR | Status: AC
  Administered 2022-08-17: 6000 [IU] via INTRAVENOUS

## 2022-08-17 MED ORDER — ONDANSETRON HCL 4 MG/2ML IJ SOLN: 4.0000 mg | Freq: Four times a day (QID) | INTRAMUSCULAR | Status: DC | PRN

## 2022-08-17 MED ORDER — SODIUM CHLORIDE 0.9 % WEIGHT BASED INFUSION: 1.0000 mL/kg/h | INTRAVENOUS | Status: AC

## 2022-08-17 MED ORDER — ASPIRIN 81 MG PO CHEW
81.0000 mg | CHEWABLE_TABLET | Freq: Every day | ORAL | Status: DC
  Administered 2022-08-18: 81 mg via ORAL
  Filled 2022-08-17: qty 1

## 2022-08-17 MED ORDER — MIDAZOLAM HCL 2 MG/2ML IJ SOLN
INTRAMUSCULAR | Status: DC | PRN
  Administered 2022-08-17: .5 mg via INTRAVENOUS

## 2022-08-17 MED ORDER — SODIUM CHLORIDE 0.9 % IV SOLN
INTRAVENOUS | Status: AC | PRN
  Administered 2022-08-17: 1.75 mg/kg/h via INTRAVENOUS

## 2022-08-17 MED ORDER — LABETALOL HCL 5 MG/ML IV SOLN: 10.0000 mg | INTRAVENOUS | Status: AC | PRN

## 2022-08-17 MED ORDER — BIVALIRUDIN TRIFLUOROACETATE 250 MG IV SOLR
INTRAVENOUS | Status: AC
  Filled 2022-08-17: qty 250

## 2022-08-17 MED ORDER — BIVALIRUDIN BOLUS VIA INFUSION - CUPID
INTRAVENOUS | Status: DC | PRN
  Administered 2022-08-17: 81.075 mg via INTRAVENOUS

## 2022-08-17 MED ORDER — FENTANYL CITRATE (PF) 100 MCG/2ML IJ SOLN
INTRAMUSCULAR | Status: AC
  Filled 2022-08-17: qty 2

## 2022-08-17 MED ORDER — VERAPAMIL HCL 2.5 MG/ML IV SOLN
INTRAVENOUS | Status: AC
  Filled 2022-08-17: qty 2

## 2022-08-17 MED ORDER — ROSUVASTATIN CALCIUM 10 MG PO TABS
40.0000 mg | ORAL_TABLET | Freq: Every day | ORAL | Status: DC
  Administered 2022-08-18 – 2022-08-19 (×2): 40 mg via ORAL
  Filled 2022-08-17 (×2): qty 4

## 2022-08-17 MED ORDER — TICAGRELOR 90 MG PO TABS
180.0000 mg | ORAL_TABLET | Freq: Once | ORAL | Status: AC
  Administered 2022-08-17: 180 mg via ORAL

## 2022-08-17 MED ORDER — SODIUM CHLORIDE 0.9% FLUSH: 3.0000 mL | INTRAVENOUS | Status: DC | PRN

## 2022-08-17 MED ORDER — ATROPINE SULFATE 1 MG/10ML IJ SOSY
PREFILLED_SYRINGE | INTRAMUSCULAR | Status: AC
  Filled 2022-08-17: qty 10

## 2022-08-17 MED ORDER — SODIUM CHLORIDE 0.9 % IV SOLN
0.2500 mg/kg/h | INTRAVENOUS | Status: AC
  Administered 2022-08-17: 0.25 mg/kg/h via INTRAVENOUS
  Filled 2022-08-17: qty 250

## 2022-08-17 MED ORDER — LORAZEPAM 2 MG/ML IJ SOLN
0.2500 mg | Freq: Once | INTRAMUSCULAR | Status: AC
  Administered 2022-08-17: 0.25 mg via INTRAVENOUS
  Filled 2022-08-17: qty 1

## 2022-08-17 MED ORDER — HEPARIN SODIUM (PORCINE) 1000 UNIT/ML IJ SOLN
INTRAMUSCULAR | Status: AC
  Filled 2022-08-17: qty 10

## 2022-08-17 MED ORDER — PANTOPRAZOLE SODIUM 40 MG IV SOLR
40.0000 mg | Freq: Two times a day (BID) | INTRAVENOUS | Status: DC
  Administered 2022-08-17 – 2022-08-19 (×4): 40 mg via INTRAVENOUS
  Filled 2022-08-17 (×4): qty 10

## 2022-08-17 MED ORDER — HEPARIN (PORCINE) IN NACL 1000-0.9 UT/500ML-% IV SOLN
INTRAVENOUS | Status: DC | PRN
  Administered 2022-08-17 (×2): 500 mL

## 2022-08-17 MED ORDER — NITROGLYCERIN 0.4 MG SL SUBL: 0.4000 mg | SUBLINGUAL_TABLET | SUBLINGUAL | Status: DC | PRN

## 2022-08-17 MED ORDER — SODIUM CHLORIDE 0.9 % IV SOLN: 250.0000 mL | INTRAVENOUS | Status: DC | PRN

## 2022-08-17 MED ORDER — TICAGRELOR 90 MG PO TABS
90.0000 mg | ORAL_TABLET | Freq: Two times a day (BID) | ORAL | Status: DC
  Administered 2022-08-18 (×2): 90 mg via ORAL
  Filled 2022-08-17 (×2): qty 1

## 2022-08-17 MED ORDER — SODIUM CHLORIDE 0.9% FLUSH
3.0000 mL | Freq: Two times a day (BID) | INTRAVENOUS | Status: DC
  Administered 2022-08-18 – 2022-08-19 (×2): 3 mL via INTRAVENOUS

## 2022-08-17 MED ORDER — HYDRALAZINE HCL 20 MG/ML IJ SOLN: 10.0000 mg | INTRAMUSCULAR | Status: AC | PRN

## 2022-08-17 SURGICAL SUPPLY — 24 items
BALLN TREK RX 2.5X12 (BALLOONS) ×1
BALLN ~~LOC~~ EUPHORA RX 3.0X20 (BALLOONS) ×1
BALLOON TREK RX 2.5X12 (BALLOONS) IMPLANT
BALLOON ~~LOC~~ EUPHORA RX 3.0X20 (BALLOONS) IMPLANT
CATH INFINITI 5 FR JL3.5 (CATHETERS) IMPLANT
CATH VISTA GUIDE 6FR JR4 (CATHETERS) IMPLANT
DEVICE CLOSURE MYNXGRIP 6/7F (Vascular Products) IMPLANT
DRAPE BRACHIAL (DRAPES) IMPLANT
GLIDESHEATH SLEND SS 6F .021 (SHEATH) IMPLANT
GUIDEWIRE INQWIRE 1.5J.035X260 (WIRE) IMPLANT
INQWIRE 1.5J .035X260CM (WIRE) ×1
KIT ENCORE 26 ADVANTAGE (KITS) IMPLANT
KIT SYRINGE INJ CVI SPIKEX1 (MISCELLANEOUS) IMPLANT
NDL PERC 18GX7CM (NEEDLE) IMPLANT
NEEDLE PERC 18GX7CM (NEEDLE) ×1 IMPLANT
PACK CARDIAC CATH (CUSTOM PROCEDURE TRAY) ×1 IMPLANT
PROTECTION STATION PRESSURIZED (MISCELLANEOUS) ×1
SET ATX SIMPLICITY (MISCELLANEOUS) IMPLANT
SHEATH AVANTI 6FR X 11CM (SHEATH) IMPLANT
STATION PROTECTION PRESSURIZED (MISCELLANEOUS) IMPLANT
STENT ONYX FRONTIER 3.0X34 (Permanent Stent) IMPLANT
TUBING CIL FLEX 10 FLL-RA (TUBING) IMPLANT
WIRE G HI TQ BMW 190 (WIRE) IMPLANT
WIRE GUIDERIGHT .035X150 (WIRE) IMPLANT

## 2022-08-17 NOTE — Telephone Encounter (Signed)
Claiborne Billings said Mrs Stile called and pt is going to [facility] ED now. I called Amber to let her know that EMS was taking pt to [facility] ED instead of [facility] and Amber voiced understanding and appreciated the call. I spoke with Mrs Bogdanski and she has already talked to her daughter and Mrs Marchak was crying and I asked if she was with some one and she said yes a neighbor was taking her to [facility] ED. Sending note to Dr Glori Bickers.

## 2022-08-17 NOTE — Assessment & Plan Note (Signed)
Replete and recheck ?

## 2022-08-17 NOTE — Assessment & Plan Note (Signed)
TSH/ FT4/ LIPIDs/

## 2022-08-17 NOTE — ED Notes (Signed)
Cardiology MD at bedside, [phone].

## 2022-08-17 NOTE — Progress Notes (Signed)
eLink Physician-Brief Progress Note Patient Name: DIANNA BREHM DOB: [birth year] MRN: [id number]   Date of Service  08/17/2022  HPI/Events of Note  89 yr old male s/p partial colectomy 2/2 for cancer presented with inferior STEMI s/p successful PCI now admitted to the ICU.  eICU Interventions  Chart reviewed     Intervention Category Evaluation Type: New Patient Evaluation  Mauri [place], P 08/17/2022, 10:58 PM

## 2022-08-17 NOTE — ED Triage Notes (Addendum)
Pt arrived by EMS for chest pain that started one hour ago.  Code stemi activated prior to arrival.  Pt took his own nitro, 337m aspirin given via EMS.

## 2022-08-17 NOTE — Telephone Encounter (Signed)
I spoke with pts wife (DPR signed) Mrs Highsmith said they had just got home and [facility] nurse called to ck on pt. Pt started having mid chest pain that went up into throat;Mrs [place] gave pt a ntg and wanted 10 mins and gave another ntg with no effectiveness to mid CP radiating into neck; pt was sweating and no SOB but pt said it hurt when he took a breath. Mrs Wurzer wanted to bring pt back to [facility] but I advised pt should go to ED; Mrs colas wanted to verify that was what Dr Glori Bickers wanted them to do. Dr Glori Bickers advised pt should go to ED. I called 911 for pt as emergency traffic and when I went back on phone Mrs Csizmadia said that pt was hurting worse in chest and sweating more and having problems breathing and was white as a sheet. No cyanosis around lips.  I called back to 911 to let them know of worsening condition and when I went back on phone with Mrs Ayson she was crying and saying he was not looking good. Mrs Angelos was home alone with pt. I stayed on phone with Mrs Kippen until EMS arrived which was just a few mins. I asked Mrs Vuolo if there was someone I could call for her and she said she would call her daughter later because she was on the road from leaving her work. Mrs Harclerode said I could call [place] pts granddaughter who is a NP at [employer]. I spoke with Amber and advised above and she appreciated call and would be at [facility] waiting for her grandparents to arrive at [facility] ED. Sending note to Dr Glori Bickers.

## 2022-08-17 NOTE — Progress Notes (Unsigned)
Subjective:    Patient ID: Ryan Garcia, male    DOB: February 23, 1934, 89 y.o.   MRN: [id number]  HPI Pt presents for f/u of hospitalization for colon cancer and anemia (from 1/29 to 2/10)   He presented on day of admission with chest pain and noted to be newly anemic with Hb of 8.7   EGD/colonoscopy on 1/31 revealed malignant tumor in mid descending colon  (invasive moderately diff adenocarcinoma), with met to one of 18 LNs  Stable renal lesion noted on CT and MRI (but per pt was told it had grown)  Palliative care was consulted He elected for surgery and had lap colectomy on 2/2   Eliquis was resumed after that   He did get a systemic inflam response syndrome RUQ  Developed area of loculated ascites and surgery noitored  Elevated wbc and fever did resolve   Seen by cardiology and he did have demand ischemia from the anemia with Trop of 651 No plan for invasive w/u  EF 60-65% on Echo 1/30 Did have mild exac of diastolic CHF and tx with lasix  Cr fluctuated and peaked at 1.49    Was tx with IV iron for anemia   DM2  Type 2 diabetes mellitus: A1c 7.8 blood sugar stable on SSI BG's have been high here, but on less than half of what he typically takes at home I think with appetite not quite normal, would recommend resuming half normal lantus and novolog dose as he returns home (he'll be on glipizide and metformin at home as well).  Can return to normal as his diet improves and if BG's appropriate.  Resume glipizide and metformin  Wt Readings from Last 3 Encounters:  08/17/22 238 lb 4 oz (108.1 kg)  08/10/22 240 lb 1.3 oz (108.9 kg)  07/05/22 257 lb 8 oz (116.8 kg)   33.23 kg/m  Vitals:   08/17/22 1432  BP: (!) 136/58  Pulse: 67  Temp: 97.8 F (36.6 C)  SpO2: 99%    Lab Results  Component Value Date   WBC 8.5 08/11/2022   HGB 8.7 (L) 08/11/2022   HCT 28.9 (L) 08/11/2022   MCV 83.5 08/11/2022   PLT 206 08/11/2022   Lab Results  Component Value Date   CREATININE  1.26 (H) 08/11/2022   BUN 16 08/11/2022   NA 137 08/11/2022   K 3.6 08/11/2022   CL 102 08/11/2022   CO2 25 08/11/2022   Lab Results  Component Value Date   ALT 30 08/09/2022   AST 19 08/09/2022   ALKPHOS 96 08/09/2022   BILITOT 0.6 08/09/2022    Has urology, onc and gen surg and endo f/u upcoming   Surgical staples were recently removed  Pain is improved   R side is still more sore than the left   Energy level is fair  Is moving around the house  Has home health set up -one person came so far per family   Had a first normal bm this am  No blood in stool    Declines chemo  Is DNR Did not want chronic blood transfusions     Patient Active Problem List   Diagnosis Date Noted   STEMI involving right coronary artery ([place]) 08/17/2022   Electrolyte abnormality 08/17/2022   Paroxysmal atrial flutter ([place]) 08/17/2022   Ascending colon malignant neoplasm ([place]) 08/01/2022   Benign neoplasm of transverse colon 08/01/2022   Benign neoplasm of sigmoid colon 08/01/2022   Benign neoplasm of rectum  08/01/2022   Duodenitis 08/01/2022   Right shoulder pain 11/21/2021   Carpal tunnel syndrome 11/21/2021   Atrial flutter with rapid ventricular response (HCC)    Rapid atrial fibrillation ([place]) 10/11/2020   Poor balance 07/26/2020   Anemia 04/20/2020   Right renal mass 04/18/2020   AKI (acute kidney injury) ([place]) 04/14/2020   Cholangitis 04/14/2020   Unstable angina (HCC)    Chest pain 04/11/2020   Coronary artery disease involving native coronary artery of native heart without angina pectoris 01/18/2019   Medicare annual wellness visit, subsequent 01/16/2019   Obesity (BMI 30-39.9) 07/09/2018   HOH (hard of hearing) 03/31/2018   Uncontrolled type 2 diabetes mellitus with hyperglycemia ([place]) 10/28/2017   Subclinical hypothyroidism 10/28/2017   Scoliosis    Obstructive sleep apnea on CPAP    Hx of skin cancer, basal cell    Hyperlipidemia associated with type 2 diabetes  mellitus (HCC)    GERD (gastroesophageal reflux disease)    Chronic upper back pain    Arthritis    Allergy    NSTEMI (non-ST elevated myocardial infarction) ([place]) 06/18/2017   Actinic keratoses 04/29/2017   Positive colorectal cancer screening using Cologuard test 10/31/2016   BPH (benign prostatic hyperplasia) 09/04/2016   B12 deficiency 11/22/2015   Routine general medical examination at a health care facility 08/26/2015   Abnormal nuclear cardiac imaging test    Dyspnea on exertion 11/27/2013   Encounter for examination of normal volunteer in research study 05/29/2013   Prostate cancer screening 07/15/2012   CAD S/P percutaneous coronary angioplasty 05/17/2011   Nonspecific abnormal results of cardiovascular function study 04/30/2011   Abnormal EKG 04/06/2011   Right low back pain 01/13/2010   Insulin dependent diabetes mellitus 10/04/2006   ERECTILE DYSFUNCTION 10/04/2006   Primary hypertension 10/04/2006   Allergic rhinitis 10/04/2006   OSTEOARTHRITIS 10/04/2006   Sleep apnea 10/04/2006   EDEMA 10/04/2006   SKIN CANCER, HX OF 10/04/2006   Past Medical History:  Diagnosis Date   AC (acromioclavicular) joint bone spurs    lt ankle   Allergy    allergic rhinitis   Anginal pain (HCC)    Arthritis    OA   BPH (benign prostatic hyperplasia)    microwave tx of prostate   CAD (coronary artery disease)    a. s/p cath in 2012 showing 70-75% LAD stenosis, 75% D1 stenosis, 75% OM1, 60-70% RCA stenosis, and 80% PDA --> medical therapy pursued b. similar findings by cath in 2016; 06/2017 DES to distal RCA   Chronic kidney disease    Chronic upper back pain    Diabetes mellitus    type II x8 years   GERD (gastroesophageal reflux disease)    HLD (hyperlipidemia)    10/12: TC 208, TG 213, HDL 36, LDL 129   Hx of skin cancer, basal cell    many skin cancers removed/under constant treatment.   Hypertension    NSTEMI (non-ST elevated myocardial infarction) ([place]) 06/18/2017   DES  to distal RCA   Obstructive sleep apnea on CPAP    Paroxysmal atrial flutter ([place])    Renal mass    Scoliosis    Past Surgical History:  Procedure Laterality Date   BIOPSY  08/01/2022   Procedure: BIOPSY;  Surgeon: Jerene Bears, MD;  [facility];  Service: Gastroenterology;;   BREAST SURGERY  2009   breast lump removed benign   BUBBLE STUDY  10/14/2020   Procedure: BUBBLE STUDY;  Surgeon: [name], MD;  Location: [facility] ENDOSCOPY;  Service: Cardiovascular;;   CARDIAC CATHETERIZATION N/A 03/15/2015   Procedure: Left Heart Cath and Coronary Angiography;  Surgeon: Belva Crome, MD;  Location: [place] CV LAB;  Service: Cardiovascular;  Laterality: N/A;   CARDIOVERSION N/A 10/14/2020   Procedure: CARDIOVERSION;  Surgeon: [name], MD;  Location: [facility] END[facility]COPY;  Service: Cardiovascular;  Laterality: N/A;   CATARACT EXTRACTION W/PHACO Right 11/29/2021   Procedure: CATARACT EXTRACTION PHACO AND INTRAOCULAR LENS PLACEMENT ([place]) RIGHT DIABETIC;  Surgeon: Leandrew Koyanagi, MD;  Location: [place];  Service: Ophthalmology;  Laterality: Right;  Diabetic 18.72 01:54.3   COLONOSCOPY N/A 08/01/2022   Procedure: COLONOSCOPY;  Surgeon: Jerene Bears, MD;  Location: [place];  Service: Gastroenterology;  Laterality: N/A;   CORONARY STENT INTERVENTION N/A 06/18/2017   Procedure: CORONARY STENT INTERVENTION;  Surgeon: Jettie Booze, MD;  Location: [place] CV LAB;  Service: Cardiovascular;  Laterality: N/A;  RCA   ESOPHAGOGASTRODUODENOSCOPY (EGD) WITH PROPOFOL N/A 08/01/2022   Procedure: ESOPHAGOGASTRODUODENOSCOPY (EGD) WITH PROPOFOL;  Surgeon: Jerene Bears, MD;  Location: [place];  Service: Gastroenterology;  Laterality: N/A;   HEMOSTASIS CLIP PLACEMENT  08/01/2022   Procedure: HEMOSTASIS CLIP PLACEMENT;  Surgeon: Jerene Bears, MD;  Location: [place] ENDOSCOPY;  Service: Gastroenterology;;   LAPAROSCOPIC LYSIS OF ADHESIONS N/A 08/03/2022   Procedure:  LAPAROSCOPIC LYSIS OF ADHESIONS;  Surgeon: Jovita Kussmaul, MD;  Location: [place];  Service: General;  Laterality: N/A;   LAPAROSCOPIC PARTIAL COLECTOMY N/A 08/03/2022   Procedure: OPEN PARTIAL COLECTOMY;  Surgeon: Jovita Kussmaul, MD;  Location: [place];  Service: General;  Laterality: N/A;   LEFT HEART CATH AND CORONARY ANGIOGRAPHY N/A 06/18/2017   Procedure: LEFT HEART CATH AND CORONARY ANGIOGRAPHY;  Surgeon: Jettie Booze, MD;  Location: [place] CV LAB;  Service: Cardiovascular;  Laterality: N/A;   LEFT HEART CATH AND CORONARY ANGIOGRAPHY N/A 04/12/2020   Procedure: LEFT HEART CATH AND CORONARY ANGIOGRAPHY;  Surgeon: Martin[place], Peter M, MD;  Location: [place] CV LAB;  Service: Cardiovascular;  Laterality: N/A;   POLYPECTOMY  08/01/2022   Procedure: POLYPECTOMY;  Surgeon: Jerene Bears, MD;  Location: [place];  Service: Gastroenterology;;   SUBMUCOSAL TATTOO INJECTION  08/01/2022   Procedure: SUBMUCOSAL TATTOO INJECTION;  Surgeon: Jerene Bears, MD;  Location: [place];  Service: Gastroenterology;;   TEE WITHOUT CARDIOVERSION N/A 10/14/2020   Procedure: TRANSESOPHAGEAL ECHOCARDIOGRAM (TEE);  Surgeon: [name], MD;  Location: [place];  Service: Cardiovascular;  Laterality: N/A;   ULTRASOUND GUIDANCE FOR VASCULAR ACCESS  06/18/2017   Procedure: Ultrasound Guidance For Vascular Access;  Surgeon: Jettie Booze, MD;  Location: [place] CV LAB;  Service: Cardiovascular;;  right radial, right femoral   Social History   Tobacco Use   Smoking status: Never    Passive exposure: Never   Smokeless tobacco: Never  Vaping Use   Vaping Use: Never used  Substance Use Topics   Alcohol use: No    Alcohol/week: 0.0 standard drinks of alcohol   Drug use: No   Family History  Problem Relation Age of Onset   Heart attack Brother    Colon cancer Neg Hx    Colon polyps Neg Hx    Stomach cancer Neg Hx    Esophageal cancer Neg Hx    Allergies  Allergen Reactions    Loratadine Other (See Comments)    Not effective   Simvastatin Other (See Comments)    Intolerant- caused joint pain  No current facility-administered medications on file prior to visit.   Current Outpatient Medications on File Prior to Visit  Medication Sig Dispense Refill   acetaminophen (TYLENOL) 325 MG tablet Take 325-650 mg by mouth every 6 (six) hours as needed for mild pain or headache.     Alcohol Swabs (B-D SINGLE USE SWABS REGULAR) PADS Use to check blood sugar 2 times a day 200 each 3   apixaban (ELIQUIS) 5 MG TABS tablet Take 1 tablet (5 mg total) by mouth 2 (two) times daily. 180 tablet 1   Blood Glucose Calibration (TRUE METRIX LEVEL 1) Low SOLN Use to check control on glucose meter 1 each 3   Blood Glucose Monitoring Suppl (TRUE METRIX AIR GLUCOSE METER) w/Device KIT 1 kit by Other route 2 (two) times daily. Check blood sugar twice daily and as directed. Dx E11.65 1 kit 0   Coenzyme Q10 (CO Q 10 PO) Take 200 mg by mouth daily.     Cyanocobalamin (VITAMIN B12) 1000 MCG TBCR Take 1,000 mcg by mouth daily. 30 tablet    DROPLET PEN NEEDLES 31G X 6 MM MISC USE ONE TIME DAILY  AT  BEDTIME  WITH  LANTUS 100 each 3   fluticasone (FLONASE) 50 MCG/ACT nasal spray Place 1 spray into both nostrils daily as needed for allergies.      furosemide (LASIX) 20 MG tablet Take 20 mg by mouth daily.     glipiZIDE (GLUCOTROL) 10 MG tablet TAKE 1 TABLET TWICE DAILY WITH MEALS 180 tablet 1   insulin glargine (LANTUS SOLOSTAR) 100 UNIT/ML Solostar Pen Inject 50 Units into the skin at bedtime. (Patient taking differently: Inject 51 Units into the skin at bedtime.) 45 mL 3   isosorbide mononitrate (IMDUR) 60 MG 24 hr tablet TAKE 1 TABLET EVERY DAY (Patient taking differently: Take 60 mg by mouth daily.) 90 tablet 1   metFORMIN (GLUCOPHAGE) 1000 MG tablet TAKE 1 TABLET TWICE DAILY WITH MEALS (Patient taking differently: Take 1,000 mg by mouth 2 (two) times daily.) 180 tablet 2   metoprolol tartrate  (LOPRESSOR) 25 MG tablet Take 0.5 tablets (12.5 mg total) by mouth 2 (two) times daily. 90 tablet 3   nitroGLYCERIN (NITROSTAT) 0.4 MG SL tablet Place 1 tablet (0.4 mg total) under the tongue every 5 (five) minutes as needed for chest pain. 25 tablet 3   NOVOLOG FLEXPEN 100 UNIT/ML FlexPen Inject 16 Units into the skin daily. Before dinner     pantoprazole (PROTONIX) 40 MG tablet Take 1 tablet (40 mg total) by mouth daily at 6 (six) AM. 30 tablet 1   TRUE METRIX BLOOD GLUCOSE TEST test strip CHECK BLOOD SUGAR TWICE DAILY 200 strip 3   TRUEplus Lancets 30G MISC Check blood sugar twice daily and as directed. Dx E11.65 200 each 3   rosuvastatin (CRESTOR) 40 MG tablet Take 1 tablet (40 mg total) by mouth daily. 90 tablet 1     Review of Systems  Constitutional:  Positive for fatigue. Negative for activity change, appetite change, fever and unexpected weight change.  HENT:  Negative for congestion, rhinorrhea, sore throat and trouble swallowing.   Eyes:  Negative for pain, redness, itching and visual disturbance.  Respiratory:  Negative for cough, chest tightness, shortness of breath, wheezing and stridor.   Cardiovascular:  Positive for leg swelling. Negative for chest pain and palpitations.  Gastrointestinal:  Negative for abdominal distention, abdominal pain, anal bleeding, blood in stool, constipation, diarrhea, nausea, rectal pain and vomiting.  Post op soreness but otherwise no abd pain  Endocrine: Negative for cold intolerance, heat intolerance, polydipsia and polyuria.  Genitourinary:  Negative for difficulty urinating, dysuria, frequency and urgency.  Musculoskeletal:  Negative for arthralgias, joint swelling and myalgias.  Skin:  Negative for pallor and rash.  Neurological:  Negative for dizziness, tremors, weakness, numbness and headaches.       Working to get strength back  Hematological:  Negative for adenopathy. Does not bruise/bleed easily.  Psychiatric/Behavioral:  Negative  for decreased concentration and dysphoric mood. The patient is not nervous/anxious.        Objective:   Physical Exam Constitutional:      General: He is not in acute distress.    Appearance: Normal appearance. He is well-developed. He is obese. He is not ill-appearing or diaphoretic.     Comments: Frail appearing  HENT:     Head: Normocephalic and atraumatic.     Mouth/Throat:     Mouth: Mucous membranes are moist.  Eyes:     General: No scleral icterus.    Conjunctiva/sclera: Conjunctivae normal.     Pupils: Pupils are equal, round, and reactive to light.  Neck:     Thyroid: No thyromegaly.     Vascular: No carotid bruit or JVD.  Cardiovascular:     Rate and Rhythm: Normal rate and regular rhythm.     Heart sounds: Normal heart sounds.     No gallop.  Pulmonary:     Effort: Pulmonary effort is normal. No respiratory distress.     Breath sounds: Normal breath sounds. No stridor. No wheezing, rhonchi or rales.     Comments: No crackles  Abdominal:     General: There is no distension or abdominal bruit.     Palpations: Abdomen is soft. There is no mass.     Tenderness: There is abdominal tenderness. There is no right CVA tenderness, left CVA tenderness, guarding or rebound.     Comments: Some very mild tenderness around incision  Well healing     Musculoskeletal:     Cervical back: Normal range of motion and neck supple.     Right lower leg: Edema present.     Left lower leg: Edema present.     Comments: Trace pedal edema  Lymphadenopathy:     Cervical: No cervical adenopathy.  Skin:    General: Skin is warm and dry.     Coloration: Skin is pale. Skin is not jaundiced.     Findings: No bruising or rash.     Comments: Mild pallor   Neurological:     Mental Status: He is alert.     Sensory: No sensory deficit.     Coordination: Coordination normal.     Deep Tendon Reflexes: Reflexes are normal and symmetric. Reflexes normal.  Psychiatric:        Mood and Affect:  Mood normal.     Comments: Mood is fairly good           Assessment & Plan:   Problem List Items Addressed This Visit       Cardiovascular and Mediastinum   Primary hypertension    bp in fair control at this time  BP Readings from Last 1 Encounters:  08/19/22 (!) 130/58  Improved s/p hosp for colon cancer and angina  No changes needed Most recent labs reviewed  Disc lifstyle change with low sodium diet and exercise  Plan to continue  Metoprolol 12.5 mg twice daily Imdur 60 mg daily Under  care of cardiology with A-fib and coronary artery disease      Relevant Orders   Comprehensive metabolic panel (Completed)     Digestive   Ascending colon malignant neoplasm ([place]) - Primary    Recently hosp for anemia due to above with angina /from demand ischemia Reviewed hospital records, lab results and studies in detail    Clinically improved  Lap colectomy welt well and he is back on eiliquis Has surgical follow up planned as well as onc and cardiology  Appetite is returning  Cbc today (s/p iron infusions)        Genitourinary   AKI (acute kidney injury) ([place])    During hosp for anemia /colon cancer Improved after fluids now  Bmet today      Relevant Orders   Comprehensive metabolic panel (Completed)     Other   Anemia    Due to colon cancer  Reviewed hospital records, lab results and studies in detail  This caused demand ischemia and angina  Now improved s/p colectomy  Has had iron infusions   Cbc today      Relevant Orders   CBC with Differential/Platelet (Completed)   Right renal mass    Pt has f/u with urology upcoming  Stable to slt enl on recent CT?

## 2022-08-17 NOTE — Assessment & Plan Note (Signed)
Lab Results  Component Value Date   CREATININE 1.26 (H) 08/11/2022   CREATININE 1.14 08/10/2022   CREATININE 1.28 (H) 08/09/2022  Will avoid any nephrotoxic meds / contrast.  Renally dose meds.  Likely prerenal

## 2022-08-17 NOTE — Consult Note (Signed)
CARDIOLOGY CONSULT NOTE               Patient ID: Ryan Garcia MRN: [id number] DOB/AGE: 1934/06/14 89 y.o.  Admit date: 08/17/2022 Referring Physician Dr. Irena Cords hospitalist Primary Physician Dr Roxy Manns primary Primary Cardiologist Dr.  Jens Som Reason for Consultation acute inferior STEMI  HPI: Patient is a 89 year old obese male with history of multiple medical problems hypertension hyperlipidemia colon cancer recent resection angina multivessel coronary disease recent demand ischemia anemia presented with acute onset of chest discomfort after seeing his primary physician EKG suggested ST elevation inferiorly send patient was then brought to the cardiac Cath Lab with intention to treat treated in the emergency room with heparin aspirin Brilinta.  Patient will hemodynamically stable reduce chest pain symptoms in the ER prior to be taken to the cardiac Cath Lab  Review of systems complete and found to be negative unless listed above     Past Medical History:  Diagnosis Date   AC (acromioclavicular) joint bone spurs    lt ankle   Allergy    allergic rhinitis   Anginal pain (HCC)    Arthritis    OA   BPH (benign prostatic hyperplasia)    microwave tx of prostate   CAD (coronary artery disease)    a. s/p cath in 2012 showing 70-75% LAD stenosis, 75% D1 stenosis, 75% OM1, 60-70% RCA stenosis, and 80% PDA --> medical therapy pursued b. similar findings by cath in 2016; 06/2017 DES to distal RCA   Chronic kidney disease    Chronic upper back pain    Diabetes mellitus    type II x8 years   GERD (gastroesophageal reflux disease)    HLD (hyperlipidemia)    10/12: TC 208, TG 213, HDL 36, LDL 129   Hx of skin cancer, basal cell    many skin cancers removed/under constant treatment.   Hypertension    NSTEMI (non-ST elevated myocardial infarction) (HCC) 06/18/2017   DES to distal RCA   Obstructive sleep apnea on CPAP    Paroxysmal atrial flutter (HCC)    Renal mass     Scoliosis     Past Surgical History:  Procedure Laterality Date   BIOPSY  08/01/2022   Procedure: BIOPSY;  Surgeon: Beverley Fiedler, MD;  Location: [facility] ENDOSCOPY;  Service: Gastroenterology;;   BREAST SURGERY  2009   breast lump removed benign   BUBBLE STUDY  10/14/2020   Procedure: BUBBLE STUDY;  Surgeon: Parke Poisson, MD;  Location: [facility] ENDOSCOPY;  Service: Cardiovascular;;   CARDIAC CATHETERIZATION N/A 03/15/2015   Procedure: Left Heart Cath and Coronary Angiography;  Surgeon: Lyn Records, MD;  Location: [facility] INVASIVE CV LAB;  Service: Cardiovascular;  Laterality: N/A;   CARDIOVERSION N/A 10/14/2020   Procedure: CARDIOVERSION;  Surgeon: Parke Poisson, MD;  Location: [facility] ENDOSCOPY;  Service: Cardiovascular;  Laterality: N/A;   CATARACT EXTRACTION W/PHACO Right 11/29/2021   Procedure: CATARACT EXTRACTION PHACO AND INTRAOCULAR LENS PLACEMENT (IOC) RIGHT DIABETIC;  Surgeon: Lockie Mola, MD;  Location: [facility] SURGERY CNTR;  Service: Ophthalmology;  Laterality: Right;  Diabetic 18.72 01:54.3   COLONOSCOPY N/A 08/01/2022   Procedure: COLONOSCOPY;  Surgeon: Beverley Fiedler, MD;  Location: [facility] ENDOSCOPY;  Service: Gastroenterology;  Laterality: N/A;   CORONARY STENT INTERVENTION N/A 06/18/2017   Procedure: CORONARY STENT INTERVENTION;  Surgeon: Corky Crafts, MD;  Location: [facility] INVASIVE CV LAB;  Service: Cardiovascular;  Laterality: N/A;  RCA   ESOPHAGOGASTRODUODENOSCOPY (EGD) WITH PROPOFOL N/A 08/01/2022  Procedure: ESOPHAGOGASTRODUODENOSCOPY (EGD) WITH PROPOFOL;  Surgeon: Beverley Fiedler, MD;  Location: [facility] ENDOSCOPY;  Service: Gastroenterology;  Laterality: N/A;   HEMOSTASIS CLIP PLACEMENT  08/01/2022   Procedure: HEMOSTASIS CLIP PLACEMENT;  Surgeon: Beverley Fiedler, MD;  Location: [facility] ENDOSCOPY;  Service: Gastroenterology;;   LAPAROSCOPIC LYSIS OF ADHESIONS N/A 08/03/2022   Procedure: LAPAROSCOPIC LYSIS OF ADHESIONS;  Surgeon: Griselda Miner, MD;  Location: [facility] OR;  Service: General;   Laterality: N/A;   LAPAROSCOPIC PARTIAL COLECTOMY N/A 08/03/2022   Procedure: OPEN PARTIAL COLECTOMY;  Surgeon: Griselda Miner, MD;  Location: [facility] OR;  Service: General;  Laterality: N/A;   LEFT HEART CATH AND CORONARY ANGIOGRAPHY N/A 06/18/2017   Procedure: LEFT HEART CATH AND CORONARY ANGIOGRAPHY;  Surgeon: Corky Crafts, MD;  Location: [facility] INVASIVE CV LAB;  Service: Cardiovascular;  Laterality: N/A;   LEFT HEART CATH AND CORONARY ANGIOGRAPHY N/A 04/12/2020   Procedure: LEFT HEART CATH AND CORONARY ANGIOGRAPHY;  Surgeon: [place], Peter M, MD;  Location: [facility] INVASIVE CV LAB;  Service: Cardiovascular;  Laterality: N/A;   POLYPECTOMY  08/01/2022   Procedure: POLYPECTOMY;  Surgeon: Beverley Fiedler, MD;  Location: [facility] ENDOSCOPY;  Service: Gastroenterology;;   SUBMUCOSAL TATTOO INJECTION  08/01/2022   Procedure: SUBMUCOSAL TATTOO INJECTION;  Surgeon: Beverley Fiedler, MD;  Location: [facility] ENDOSCOPY;  Service: Gastroenterology;;   TEE WITHOUT CARDIOVERSION N/A 10/14/2020   Procedure: TRANSESOPHAGEAL ECHOCARDIOGRAM (TEE);  Surgeon: Parke Poisson, MD;  Location: [facility] ENDOSCOPY;  Service: Cardiovascular;  Laterality: N/A;   ULTRASOUND GUIDANCE FOR VASCULAR ACCESS  06/18/2017   Procedure: Ultrasound Guidance For Vascular Access;  Surgeon: Corky Crafts, MD;  Location: [facility] INVASIVE CV LAB;  Service: Cardiovascular;;  right radial, right femoral    Medications Prior to Admission  Medication Sig Dispense Refill Last Dose   acetaminophen (TYLENOL) 325 MG tablet Take 325-650 mg by mouth every 6 (six) hours as needed for mild pain or headache.      Alcohol Swabs (B-D SINGLE USE SWABS REGULAR) PADS Use to check blood sugar 2 times a day 200 each 3    apixaban (ELIQUIS) 5 MG TABS tablet Take 1 tablet (5 mg total) by mouth 2 (two) times daily. 180 tablet 1    Blood Glucose Calibration (TRUE METRIX LEVEL 1) Low SOLN Use to check control on glucose meter 1 each 3    Blood Glucose Monitoring Suppl (TRUE METRIX AIR  GLUCOSE METER) w/Device KIT 1 kit by Other route 2 (two) times daily. Check blood sugar twice daily and as directed. Dx E11.65 1 kit 0    Coenzyme Q10 (CO Q 10 PO) Take 200 mg by mouth daily.      Cyanocobalamin (VITAMIN B12) 1000 MCG TBCR Take 1,000 mcg by mouth daily. 30 tablet     DROPLET PEN NEEDLES 31G X 6 MM MISC USE ONE TIME DAILY  AT  BEDTIME  WITH  LANTUS 100 each 3    fluticasone (FLONASE) 50 MCG/ACT nasal spray Place 1 spray into both nostrils daily as needed for allergies.       furosemide (LASIX) 20 MG tablet Take 20 mg by mouth daily.      glipiZIDE (GLUCOTROL) 10 MG tablet TAKE 1 TABLET TWICE DAILY WITH MEALS 180 tablet 1    insulin glargine (LANTUS SOLOSTAR) 100 UNIT/ML Solostar Pen Inject 50 Units into the skin at bedtime. (Patient taking differently: Inject 51 Units into the skin at bedtime.) 45 mL 3    isosorbide mononitrate (IMDUR) 60 MG  24 hr tablet TAKE 1 TABLET EVERY DAY (Patient taking differently: Take 60 mg by mouth daily.) 90 tablet 1    metFORMIN (GLUCOPHAGE) 1000 MG tablet TAKE 1 TABLET TWICE DAILY WITH MEALS (Patient taking differently: Take 1,000 mg by mouth 2 (two) times daily.) 180 tablet 2    metoprolol tartrate (LOPRESSOR) 25 MG tablet Take 0.5 tablets (12.5 mg total) by mouth 2 (two) times daily. 90 tablet 3    nitroGLYCERIN (NITROSTAT) 0.4 MG SL tablet Place 1 tablet (0.4 mg total) under the tongue every 5 (five) minutes as needed for chest pain. 25 tablet 3    NOVOLOG FLEXPEN 100 UNIT/ML FlexPen Inject 16 Units into the skin daily. Before dinner      pantoprazole (PROTONIX) 40 MG tablet Take 1 tablet (40 mg total) by mouth daily at 6 (six) AM. 30 tablet 1    rosuvastatin (CRESTOR) 40 MG tablet Take 1 tablet (40 mg total) by mouth daily. 90 tablet 1    TRUE METRIX BLOOD GLUCOSE TEST test strip CHECK BLOOD SUGAR TWICE DAILY 200 strip 3    TRUEplus Lancets 30G MISC Check blood sugar twice daily and as directed. Dx E11.65 200 each 3    Social History    Socioeconomic History   Marital status: Married    Spouse name: Demeterius Kreisman   Number of children: 3   Years of education: Not on file   Highest education level: Not on file  Occupational History   Not on file  Tobacco Use   Smoking status: Never    Passive exposure: Never   Smokeless tobacco: Never  Vaping Use   Vaping Use: Never used  Substance and Sexual Activity   Alcohol use: No    Alcohol/week: 0.0 standard drinks of alcohol   Drug use: No   Sexual activity: Not Currently  Other Topics Concern   Not on file  Social History Narrative   Married    Physically active hard of hearing   Social Determinants of Health   Financial Resource Strain: Low Risk  (06/29/2021)   Overall Financial Resource Strain (CARDIA)    Difficulty of Paying Living Expenses: Not hard at all  Food Insecurity: No Food Insecurity (08/15/2022)   Hunger Vital Sign    Worried About Running Out of Food in the Last Year: Never true    Ran Out of Food in the Last Year: Never true  Transportation Needs: No Transportation Needs (08/15/2022)   PRAPARE - Administrator, Civil Service (Medical): No    Lack of Transportation (Non-Medical): No  Physical Activity: Inactive (06/29/2021)   Exercise Vital Sign    Days of Exercise per Week: 0 days    Minutes of Exercise per Session: 0 min  Stress: No Stress Concern Present (06/29/2021)   [employer] of Occupational Health - Occupational Stress Questionnaire    Feeling of Stress : Not at all  Social Connections: Moderately Integrated (06/29/2021)   Social Connection and Isolation Panel [NHANES]    Frequency of Communication with Friends and Family: More than three times a week    Frequency of Social Gatherings with Friends and Family: More than three times a week    Attends Religious Services: More than 4 times per year    Active Member of [employer] or Organizations: No    Attends Banker Meetings: Never    Marital Status: Married   Catering manager Violence: Not At Risk (07/31/2022)   Humiliation, Afraid, Rape, and Kick questionnaire  Fear of Current or Ex-Partner: No    Emotionally Abused: No    Physically Abused: No    Sexually Abused: No    Family History  Problem Relation Age of Onset   Heart attack Brother    Colon cancer Neg Hx    Colon polyps Neg Hx    Stomach cancer Neg Hx    Esophageal cancer Neg Hx       Review of systems complete and found to be negative unless listed above      PHYSICAL EXAM  General: Well developed, well nourished, in no acute distress HEENT:  Normocephalic and atramatic Neck:  No JVD.  Lungs: Clear bilaterally to auscultation and percussion. Heart: HRRR . Normal S1 and S2 without gallops or murmurs.  Abdomen: Bowel sounds are positive, abdomen soft and non-tender  Msk:  Back normal, normal gait. Normal strength and tone for age. Extremities: No clubbing, cyanosis or edema.   Neuro: Alert and oriented X 3. Psych:  Good affect, responds appropriately  Labs:   Lab Results  Component Value Date   WBC 8.5 08/11/2022   HGB 8.7 (L) 08/11/2022   HCT 28.9 (L) 08/11/2022   MCV 83.5 08/11/2022   PLT 206 08/11/2022    Recent Labs  Lab 08/11/22 0019  NA 137  K 3.6  CL 102  CO2 25  BUN 16  CREATININE 1.26*  CALCIUM 8.0*  GLUCOSE 192*   Lab Results  Component Value Date   CKTOTAL 58 06/18/2017   CKMB 2.2 06/18/2017   TROPONINI <0.03 07/03/2018    Lab Results  Component Value Date   CHOL 86 07/31/2022   CHOL 112 09/22/2021   CHOL 113 10/12/2020   Lab Results  Component Value Date   HDL 29 (L) 07/31/2022   HDL 37.70 (L) 09/22/2021   HDL 41 10/12/2020   Lab Results  Component Value Date   LDLCALC 38 07/31/2022   LDLCALC 57 09/22/2021   LDLCALC 57 10/12/2020   Lab Results  Component Value Date   TRIG 95 07/31/2022   TRIG 88.0 09/22/2021   TRIG 73 10/12/2020   Lab Results  Component Value Date   CHOLHDL 3.0 07/31/2022   CHOLHDL 3 09/22/2021    CHOLHDL 2.8 10/12/2020   Lab Results  Component Value Date   LDLDIRECT 78.5 11/17/2012   LDLDIRECT 156.5 05/09/2010   LDLDIRECT 152.9 08/09/2009      Radiology: VAS [place] LOWER EXTREMITY VENOUS (DVT)  Result Date: 08/09/2022  Lower Venous DVT Study Patient Name:  JAURON FREERKSEN  Date of Exam:   08/08/2022 Medical Rec #: [id number]      Accession #:    [id number] Date of Birth: 09-May-1934      Patient Gender: M Patient Age:   71 years Exam Location:  [facility] Procedure:      VAS [place] LOWER EXTREMITY VENOUS (DVT) Referring Phys: A POWELL JR --------------------------------------------------------------------------------  Indications: Swelling, and Edema. Other Indications: S/P partial colectomy. Performing Technologist: Marilynne Halsted RDMS, RVT  Examination Guidelines: A complete evaluation includes B-mode imaging, spectral Doppler, color Doppler, and power Doppler as needed of all accessible portions of each vessel. Bilateral testing is considered an integral part of a complete examination. Limited examinations for reoccurring indications may be performed as noted. The reflux portion of the exam is performed with the patient in reverse Trendelenburg.  +---------+---------------+---------+-----------+----------+--------------+ RIGHT    CompressibilityPhasicitySpontaneityPropertiesThrombus Aging +---------+---------------+---------+-----------+----------+--------------+ CFV      Full  Yes      Yes                                 +---------+---------------+---------+-----------+----------+--------------+ SFJ      Full                                                        +---------+---------------+---------+-----------+----------+--------------+ FV Prox  Full                                                        +---------+---------------+---------+-----------+----------+--------------+ FV Mid   Full                                                         +---------+---------------+---------+-----------+----------+--------------+ FV DistalFull                                                        +---------+---------------+---------+-----------+----------+--------------+ PFV      Full                                                        +---------+---------------+---------+-----------+----------+--------------+ POP      Full           Yes      Yes                                 +---------+---------------+---------+-----------+----------+--------------+ PTV      Full                                                        +---------+---------------+---------+-----------+----------+--------------+ PERO     Full                                                        +---------+---------------+---------+-----------+----------+--------------+   +---------+---------------+---------+-----------+----------+--------------+ LEFT     CompressibilityPhasicitySpontaneityPropertiesThrombus Aging +---------+---------------+---------+-----------+----------+--------------+ CFV      Full           Yes      Yes                                 +---------+---------------+---------+-----------+----------+--------------+ SFJ  Full                                                        +---------+---------------+---------+-----------+----------+--------------+ FV Prox  Full                                                        +---------+---------------+---------+-----------+----------+--------------+ FV Mid   Full                                                        +---------+---------------+---------+-----------+----------+--------------+ FV DistalFull                                                        +---------+---------------+---------+-----------+----------+--------------+ PFV      Full                                                         +---------+---------------+---------+-----------+----------+--------------+ POP      Full           Yes      Yes                                 +---------+---------------+---------+-----------+----------+--------------+ PTV      Full                                                        +---------+---------------+---------+-----------+----------+--------------+ PERO     Full                                                        +---------+---------------+---------+-----------+----------+--------------+     Summary: BILATERAL: - No evidence of deep vein thrombosis seen in the lower extremities, bilaterally. -No evidence of popliteal cyst, bilaterally.   *See table(s) above for measurements and observations. Electronically signed by Heath Lark on 08/09/2022 at 2:49:54 PM.    Final    CT ABDOMEN PELVIS W CONTRAST  Result Date: 08/08/2022 CLINICAL DATA:  Status post laparoscopic colectomy on February 2nd for colon cancer. Abdominal pain. * Tracking Code: BO * EXAM: CT ABDOMEN AND PELVIS WITH CONTRAST TECHNIQUE: Multidetector CT imaging of the abdomen and pelvis was performed using the standard protocol following bolus administration of intravenous contrast. RADIATION DOSE REDUCTION: This exam was performed according to the  departmental dose-optimization program which includes automated exposure control, adjustment of the mA and/or kV according to patient size and/or use of iterative reconstruction technique. CONTRAST:  75mL OMNIPAQUE IOHEXOL 350 MG/ML SOLN COMPARISON:  07/31/2022 chest abdomen and pelvic CTs. FINDINGS: Lower chest: Right greater than left base dependent compressive atelectasis. Right lower lobe calcified granuloma. Mild cardiomegaly with multivessel coronary artery calcification. Tiny bilateral pleural effusions. Hepatobiliary: Normal liver. Gallstones up to 6 mm without acute cholecystitis or biliary duct dilatation. Pancreas: Normal, without mass or ductal dilatation.  Spleen: Normal in size, without focal abnormality. Adrenals/Urinary Tract: Normal adrenal glands. Bilateral renal cysts including up to 12.1 cm. Upper pole right renal 2.5 cm lesion has been evaluated previously on dedicated imaging (11/8 today). No hydronephrosis. Normal urinary bladder. Stomach/Bowel: Normal stomach, without wall thickening. Descending duodenal diverticulum is tiny. Otherwise normal small bowel. Scattered colonic diverticula. Status post partial right hemicolectomy. Vascular/Lymphatic: Aortic atherosclerosis. No abdominopelvic adenopathy. Reproductive: Mild prostatomegaly. Other: Trace fluid within the pelvic cul-de-sac is new. There is also ill-defined small volume right perihepatic fluid, just lateral to the surgical sutures. Example at 4.9 x 1.4 cm on 41/3. 6.2 cm craniocaudal on 58/6. No surrounding enhancement or gas within. No intra-abdominal/pelvic extraluminal gas. There is trace gas within the anterior abdominal wall which is presumably postoperative in the setting of midline laparotomy staples. Musculoskeletal: No acute osseous abnormality. IMPRESSION: 1. Status post partial right hemicolectomy, without acute postoperative complication. 2. Small volume right perihepatic fluid, just lateral to the surgical site. Likely an area of loculated ascites. No abnormal enhancement or gas within to suggest abscess. 3. Tiny bilateral pleural effusions and bibasilar atelectasis. Trace cul-de-sac fluid. 4. Cholelithiasis 5. Upper pole right renal lesion has been detailed on dedicated imaging as suspicious for renal cell carcinoma. 6. Coronary artery atherosclerosis. Aortic Atherosclerosis (ICD10-I70.0). Electronically Signed   By: Jeronimo Greaves M.D.   On: 08/08/2022 12:02   DG CHEST PORT 1 VIEW  Result Date: 08/08/2022 CLINICAL DATA:  Chest pain. EXAM: PORTABLE CHEST 1 VIEW COMPARISON:  CT chest 07/31/2022 and chest radiograph 07/30/2022. FINDINGS: Trachea is midline. Heart is enlarged, stable.  Lungs are somewhat low in volume with mild bibasilar interstitial prominence and indistinctness. Tiny bilateral pleural effusions. IMPRESSION: Tiny bilateral effusions with suspected mild pulmonary edema. Electronically Signed   By: Leanna Battles M.D.   On: 08/08/2022 09:11   ECHOCARDIOGRAM COMPLETE  Result Date: 07/31/2022    ECHOCARDIOGRAM REPORT   Patient Name:   RADIN BUKHARI Date of Exam: 07/31/2022 Medical Rec #:  [id number]     Height:       71.0 in Accession #:    [id number]    Weight:       257.5 lb Date of Birth:  1933/10/21     BSA:          2.348 m Patient Age:    88 years      BP:           151/68 mmHg Patient Gender: M             HR:           64 bpm. Exam Location:  Inpatient Procedure: 2D Echo, Cardiac Doppler and Color Doppler                       STAT ECHO Reported to: Dr Lennie Odor on 07/31/2022 8:06:00 AM. Indications:    NSTEMI  History:        Patient  has prior history of Echocardiogram examinations, most                 recent 10/14/2020. Previous Myocardial Infarction,                 Arrythmias:Atrial Fibrillation; Risk Factors:Diabetes,                 Hypertension and Sleep Apnea.  Sonographer:    Ross Ludwig RDCS (AE) Referring Phys: [id number] CAROLE N HALL IMPRESSIONS  1. Left ventricular ejection fraction, by estimation, is 60 to 65%. The left ventricle has normal function. The left ventricle has no regional wall motion abnormalities. There is mild concentric left ventricular hypertrophy. Left ventricular diastolic function could not be evaluated.  2. Right ventricular systolic function is normal. The right ventricular size is normal. There is mildly elevated pulmonary artery systolic pressure. The estimated right ventricular systolic pressure is 36.6 mmHg.  3. The mitral valve is grossly normal. No evidence of mitral valve regurgitation. No evidence of mitral stenosis.  4. The aortic valve is tricuspid. There is mild calcification of the aortic valve. There is mild thickening of  the aortic valve. Aortic valve regurgitation is not visualized. Aortic valve sclerosis/calcification is present, without any evidence of aortic stenosis.  5. The inferior vena cava is normal in size with greater than 50% respiratory variability, suggesting right atrial pressure of 3 mmHg. Comparison(s): No significant change from prior study. FINDINGS  Left Ventricle: Left ventricular ejection fraction, by estimation, is 60 to 65%. The left ventricle has normal function. The left ventricle has no regional wall motion abnormalities. The left ventricular internal cavity size was normal in size. There is  mild concentric left ventricular hypertrophy. Left ventricular diastolic function could not be evaluated due to atrial fibrillation. Left ventricular diastolic function could not be evaluated. Right Ventricle: The right ventricular size is normal. No increase in right ventricular wall thickness. Right ventricular systolic function is normal. There is mildly elevated pulmonary artery systolic pressure. The tricuspid regurgitant velocity is 2.90  m/s, and with an assumed right atrial pressure of 3 mmHg, the estimated right ventricular systolic pressure is 36.6 mmHg. Left Atrium: Left atrial size was normal in size. Right Atrium: Right atrial size was normal in size. Pericardium: There is no evidence of pericardial effusion. Mitral Valve: The mitral valve is grossly normal. No evidence of mitral valve regurgitation. No evidence of mitral valve stenosis. Tricuspid Valve: The tricuspid valve is grossly normal. Tricuspid valve regurgitation is mild . No evidence of tricuspid stenosis. Aortic Valve: The aortic valve is tricuspid. There is mild calcification of the aortic valve. There is mild thickening of the aortic valve. Aortic valve regurgitation is not visualized. Aortic valve sclerosis/calcification is present, without any evidence of aortic stenosis. Aortic valve mean gradient measures 5.0 mmHg. Aortic valve peak  gradient measures 9.7 mmHg. Aortic valve area, by VTI measures 2.75 cm. Pulmonic Valve: The pulmonic valve was grossly normal. Pulmonic valve regurgitation is not visualized. No evidence of pulmonic stenosis. Aorta: The aortic root and ascending aorta are structurally normal, with no evidence of dilitation. Venous: The inferior vena cava is normal in size with greater than 50% respiratory variability, suggesting right atrial pressure of 3 mmHg. IAS/Shunts: The atrial septum is grossly normal.  LEFT VENTRICLE PLAX 2D LVIDd:         4.80 cm LVIDs:         3.10 cm LV PW:         1.10 cm  LV IVS:        1.20 cm LVOT diam:     2.10 cm LV SV:         93 LV SV Index:   40 LVOT Area:     3.46 cm  RIGHT VENTRICLE             IVC RV Basal diam:  3.20 cm     IVC diam: 1.40 cm RV S prime:     13.90 cm/s TAPSE (M-mode): 2.7 cm LEFT ATRIUM              Index        RIGHT ATRIUM           Index LA diam:        3.90 cm  1.66 cm/m   RA Area:     21.60 cm LA Vol (A2C):   104.0 ml 44.30 ml/m  RA Volume:   59.20 ml  25.22 ml/m LA Vol (A4C):   70.2 ml  29.90 ml/m LA Biplane Vol: 88.7 ml  37.78 ml/m  AORTIC VALVE AV Area (Vmax):    2.86 cm AV Area (Vmean):   2.80 cm AV Area (VTI):     2.75 cm AV Vmax:           156.00 cm/s AV Vmean:          105.000 cm/s AV VTI:            0.338 m AV Peak Grad:      9.7 mmHg AV Mean Grad:      5.0 mmHg LVOT Vmax:         129.00 cm/s LVOT Vmean:        84.900 cm/s LVOT VTI:          0.268 m LVOT/AV VTI ratio: 0.79  AORTA Ao Root diam: 3.40 cm Ao Asc diam:  3.60 cm TRICUSPID VALVE TR Peak grad:   33.6 mmHg TR Vmax:        290.00 cm/s  SHUNTS Systemic VTI:  0.27 m Systemic Diam: 2.10 cm Lennie Odor MD Electronically signed by Lennie Odor MD Signature Date/Time: 07/31/2022/8:20:37 AM    Final    CT CHEST ABDOMEN PELVIS W CONTRAST  Result Date: 07/31/2022 CLINICAL DATA:  Chest pain, known right renal mass, evaluate for metastatic disease EXAM: CT CHEST, ABDOMEN, AND PELVIS WITH CONTRAST  TECHNIQUE: Multidetector CT imaging of the chest, abdomen and pelvis was performed following the standard protocol during bolus administration of intravenous contrast. RADIATION DOSE REDUCTION: This exam was performed according to the departmental dose-optimization program which includes automated exposure control, adjustment of the mA and/or kV according to patient size and/or use of iterative reconstruction technique. CONTRAST:  80mL OMNIPAQUE IOHEXOL 350 MG/ML SOLN COMPARISON:  MRI abdomen dated 08/14/2021. CTA chest dated 10/12/2020. FINDINGS: CT CHEST FINDINGS Cardiovascular: Heart is normal in size.  No pericardial effusion. No evidence of thoracic aortic aneurysm. Atherosclerotic calcifications of the aortic arch. Severe three-vessel coronary atherosclerosis. Mediastinum/Nodes: 10 mm short axis right azygoesophageal recess node, previously 12 mm. Additional calcified right perihilar nodes. No suspicious mediastinal lymphadenopathy. Visualized thyroid is unremarkable. Lungs/Pleura: No suspicious pulmonary nodules. Calcified granuloma in the right lower lobe, benign. Mild subpleural reticulation/scarring in the lungs bilaterally, lower lobe predominant. No pleural effusion or pneumothorax. Musculoskeletal: Moderate degenerative changes of the thoracic spine. No focal osseous lesions. CT ABDOMEN PELVIS FINDINGS Hepatobiliary: Liver is within normal limits. No suspicious/enhancing hepatic lesions. Layering small gallstones (series 3/image 66), without associated  inflammatory changes. No intrahepatic or extrahepatic duct dilatation. Pancreas: Within normal limits. Spleen: Within normal limits. Adrenals/Urinary Tract: Adrenal glands are within normal limits. Multiple simple bilateral renal cysts, measuring up to 14.8 cm in the left upper kidney (series 3/image 68), benign (Bosniak I). No follow-up is recommended. 2.5 x 2.6 cm enhancing lesion in the anterior right upper kidney (series 3/image 70), grossly unchanged  from prior MRI, compatible with solid renal neoplasm such as renal cell carcinoma. No hydronephrosis. Bladder is underdistended but unremarkable. Stomach/Bowel: Stomach is notable for a tiny hiatal hernia. No evidence of bowel obstruction. Normal appendix (series 3/image 98). Sigmoid diverticulosis, without evidence of diverticulitis. Vascular/Lymphatic: No evidence of abdominal aortic aneurysm. Atherosclerotic calcifications of the abdominal aorta and branch vessels. No suspicious abdominopelvic lymphadenopathy. Reproductive: Prostate is unremarkable. Other: No abdominopelvic ascites. Musculoskeletal: Mild anterior wedging at L1, chronic. Degenerative changes of the lumbar spine. No focal osseous lesions. IMPRESSION: 2.6 cm enhancing lesion in the anterior right upper kidney, grossly unchanged from prior MRI, compatible with solid renal neoplasm such as renal cell carcinoma. No findings suspicious for metastatic disease. Cholelithiasis, without associated inflammatory changes. Additional ancillary findings as above. Electronically Signed   By: Charline Bills M.D.   On: 07/31/2022 02:13   DG Chest 2 View  Result Date: 07/30/2022 CLINICAL DATA:  Chest pain EXAM: CHEST - 2 VIEW COMPARISON:  10/11/2020 FINDINGS: Cardiac silhouette enlarged. No evidence of pneumothorax or pleural effusion. Pulmonary vascular congestion. No evidence of pulmonary edema. No focal consolidation. Aorta is calcified. There are thoracic degenerative changes. IMPRESSION: Enlarged cardiac silhouette.  Pulmonary vascular congestion. Electronically Signed   By: [name] M.D.   On: 07/30/2022 17:57    EKG: Normal sinus rhythm ST elevation inferiorly reciprocal depressions laterally rate of 70  ASSESSMENT AND PLAN:  STEMI inferior Occluded occlusion mid to distal RCA Obesity Hyperlipidemia Hypertension Multivessel coronary artery disease Diabetes type 2 History of demand ischemia Status post abdominal surgery with colon  resection Colon cancer status postresection refuses chemo .  Plan Status post PCI and stent to distal RCA DES Continue aspirin and Brilinta short course of Angiomax Recommend beta-blocker ACE ARB Agree with statin therapy hyperlipidemia management Diabetes management and control Mild renal insufficiency recommend adequate hydration Recommend  echocardiogram for further evaluation status post STEMI    Signed: Alwyn Pea MD, 08/17/2022, 6:16 PM

## 2022-08-17 NOTE — Assessment & Plan Note (Signed)
No symptoms.  No BPH meds. Foley PRN.

## 2022-08-17 NOTE — Patient Instructions (Signed)
Labs today  Continue current medicines   Gradually increase your activity  No heavy lifting   Please let us know if fatigue or any other symptoms worsen Keep up good fluid intake and try to eat health    Follow up with your specialists as planned

## 2022-08-17 NOTE — Assessment & Plan Note (Addendum)
Status post successful PCI in stenting to RCA Cardiology planning to transition Brilinta to Plavix and start Eliquis maybe tomorrow Continue metoprolol and statin Pending echo Cardiac rehab at discharge

## 2022-08-17 NOTE — Assessment & Plan Note (Signed)
Cont Protonix.

## 2022-08-17 NOTE — CV Procedure (Signed)
Brief STEMI cath note Patient presents with inferior STEMI in the field brought in by EMS inferior changes Patient had severe chest pain earlier but was improved by time he got to emergency room hemodynamically stable Recently had abdominal surgery within the last 2 to 3 weeks for colon cancer  EKG suggested ST elevation inferiorly reciprocal changes laterally  Patient was given heparin aspirin Brilinta in the emergency room  Brought to the cardiac Cath Lab right femoral approach Left ventriculogram showed preserved left ventricular function borderline inferior hypo-  Left coronary system large left main mild irregularities LAD diffuse moderate to high-grade stenosis TIMI-3 flow Circumflex large diffuse moderate to high-grade stenosis TIMI-3 flow RCA large totally occluded mid to distal TIMI 0 flow  Intervention Successful PCI and stent mid to distal RCA 1 drug-eluting stent 3 oh x 34 mm frontier Onyx deployed at 14 atm Postdilated with a 3.0x 20 Marshall Euphora up to 25 atm  Lesion reduced from 100 down to 0% TIMI-3 flow restored from TIMI 0 Recommendations aspirin Brilinta for 12 months  Transferred to ICU Anticipate discharge within  48 hours if patient remains stable  Hospitalist consulted for medical management Patient will be transferred to cardiology management in the morning to [facility]  Full note to follow

## 2022-08-17 NOTE — Hospital Course (Signed)
89 y.o. male with a hx of multivessel CAD as outlined below, paroxysmal atrial flutter on apixaban status post TEE guided DCCV in 09/2020, recently diagnosed adenocarcinoma of the colon status post surgical resection on 08/03/2022, iron deficiency anemia, HFpEF, right renal mass concerning for renal cell carcinoma, HTN, HLD, and OSA on CPAP was admitted for inferior STEMI   2/16: Emergent cardiac cath status post successful PCI and stenting of RCA  2/17: Transfer to PCU

## 2022-08-17 NOTE — Telephone Encounter (Signed)
Patient called in reporting that once they got home from their visit with [facility],patient began to look pale,with chest and throat pain. He was given a nitro pill,but they wanted to call in to see what to do. They spoke with Rena to be triaged.

## 2022-08-17 NOTE — Assessment & Plan Note (Signed)
    Latest Ref Rng & Units 08/11/2022   12:19 AM 08/10/2022    6:52 AM 08/09/2022   12:19 AM  CBC  WBC 4.0 - 10.5 K/uL 8.5  10.4  13.2   Hemoglobin 13.0 - 17.0 g/dL 8.7  8.8  8.8   Hematocrit 39.0 - 52.0 % 28.9  29.5  29.2   Platelets 150 - 400 K/uL 206  195  206   Pt has colon cancer diagnosis recently and will defer to oncology hematology to follow evaluate and manage his anemia. Currently we will follow and type and screen.

## 2022-08-17 NOTE — Telephone Encounter (Signed)
Patients wife called back to let Dr Glori Bickers know that he was having a heart attack and  they were transporting him to [facility].

## 2022-08-17 NOTE — Assessment & Plan Note (Signed)
S/P removal and stitches removed today.  Pt followed by gi and gen surg.

## 2022-08-17 NOTE — H&P (Signed)
History and Physical    Chief Complaint: STEMI.   HISTORY OF PRESENT ILLNESS: Ryan Garcia is an 89 y.o. male  seen in cath lab for STEMI s/p Successful PCI and stent mid to distal RCA 1 drug-eluting stent 3 oh x 34 mm frontier Onyx deployed at 14 atm Postdilated with a 3.0x 20 [place] Euphora up to 25 atm. Lesion reduced from 100 down to 0% , TIMI-3 flow restored from TIMI 0, Recommendations aspirin Brilinta for 12 months. S/p OR lap assisted rt colectomy 08/03/22.  Surg path 08/03/22 with invasive moderately differentiated adenocarcinoma. He had open partial colectomy on 08/03/2022 with Dr Marlou Starks. Incision was prepped with ChloraPrep.  Patient had his staples removed yesterday. He developed  1 hour of crushing chest pain with field EKG showing inferior ST elevation. STEMI was activated from the field.  On arrival to ED patient is mentating normally he is still complaining of ongoing chest pain although improved.  Received aspirin in the field as well as give himself 3 nitroglycerin. Was hypotensive 90s over 8s in the field and was receiving fluid currently.   Pt has  Past Medical History:  Diagnosis Date   AC (acromioclavicular) joint bone spurs    lt ankle   Allergy    allergic rhinitis   Anginal pain (HCC)    Arthritis    OA   BPH (benign prostatic hyperplasia)    microwave tx of prostate   CAD (coronary artery disease)    a. s/p cath in 2012 showing 70-75% LAD stenosis, 75% D1 stenosis, 75% OM1, 60-70% RCA stenosis, and 80% PDA --> medical therapy pursued b. similar findings by cath in 2016; 06/2017 DES to distal RCA   Chronic kidney disease    Chronic upper back pain    Diabetes mellitus    type II x8 years   GERD (gastroesophageal reflux disease)    HLD (hyperlipidemia)    10/12: TC 208, TG 213, HDL 36, LDL 129   Hx of skin cancer, basal cell    many skin cancers removed/under constant treatment.   Hypertension    NSTEMI (non-ST elevated myocardial infarction) ([place]) 06/18/2017    DES to distal RCA   Obstructive sleep apnea on CPAP    Paroxysmal atrial flutter (HCC)    Renal mass    Scoliosis      Review of Systems  HENT:  Positive for hearing loss.   Cardiovascular:  Positive for chest pain.  All other systems reviewed and are negative.  Allergies  Allergen Reactions   Loratadine Other (See Comments)    Not effective   Simvastatin Other (See Comments)    Intolerant- caused joint pain   Past Surgical History:  Procedure Laterality Date   BIOPSY  08/01/2022   Procedure: BIOPSY;  Surgeon: Jerene Bears, MD;  Location: [facility] ENDOSCOPY;  Service: Gastroenterology;;   BREAST SURGERY  2009   breast lump removed benign   BUBBLE STUDY  10/14/2020   Procedure: BUBBLE STUDY;  Surgeon: [name], MD;  Location: [place];  Service: Cardiovascular;;   CARDIAC CATHETERIZATION N/A 03/15/2015   Procedure: Left Heart Cath and Coronary Angiography;  Surgeon: Belva Crome, MD;  Location: [place] CV LAB;  Service: Cardiovascular;  Laterality: N/A;   CARDIOVERSION N/A 10/14/2020   Procedure: CARDIOVERSION;  Surgeon: [name], MD;  Location: [facility] ENDOSCOPY;  Service: Cardiovascular;  Laterality: N/A;   CATARACT EXTRACTION W/PHACO Right 11/29/2021   Procedure: CATARACT EXTRACTION PHACO AND INTRAOCULAR LENS PLACEMENT (  IOC) RIGHT DIABETIC;  Surgeon: Leandrew Koyanagi, MD;  Location: [place];  Service: Ophthalmology;  Laterality: Right;  Diabetic 18.72 01:54.3   COLONOSCOPY N/A 08/01/2022   Procedure: COLONOSCOPY;  Surgeon: Jerene Bears, MD;  Location: [place];  Service: Gastroenterology;  Laterality: N/A;   CORONARY STENT INTERVENTION N/A 06/18/2017   Procedure: CORONARY STENT INTERVENTION;  Surgeon: Jettie Booze, MD;  Location: [place] CV LAB;  Service: Cardiovascular;  Laterality: N/A;  RCA   ESOPHAGOGASTRODUODENOSCOPY (EGD) WITH PROPOFOL N/A 08/01/2022   Procedure: ESOPHAGOGASTRODUODENOSCOPY (EGD) WITH PROPOFOL;  Surgeon:  Jerene Bears, MD;  Location: [place];  Service: Gastroenterology;  Laterality: N/A;   HEMOSTASIS CLIP PLACEMENT  08/01/2022   Procedure: HEMOSTASIS CLIP PLACEMENT;  Surgeon: Jerene Bears, MD;  Location: [place] ENDOSCOPY;  Service: Gastroenterology;;   LAPAROSCOPIC LYSIS OF ADHESIONS N/A 08/03/2022   Procedure: LAPAROSCOPIC LYSIS OF ADHESIONS;  Surgeon: Jovita Kussmaul, MD;  Location: [place];  Service: General;  Laterality: N/A;   LAPAROSCOPIC PARTIAL COLECTOMY N/A 08/03/2022   Procedure: OPEN PARTIAL COLECTOMY;  Surgeon: Jovita Kussmaul, MD;  Location: [place];  Service: General;  Laterality: N/A;   LEFT HEART CATH AND CORONARY ANGIOGRAPHY N/A 06/18/2017   Procedure: LEFT HEART CATH AND CORONARY ANGIOGRAPHY;  Surgeon: Jettie Booze, MD;  Location: [place] CV LAB;  Service: Cardiovascular;  Laterality: N/A;   LEFT HEART CATH AND CORONARY ANGIOGRAPHY N/A 04/12/2020   Procedure: LEFT HEART CATH AND CORONARY ANGIOGRAPHY;  Surgeon: Martinique, Peter M, MD;  Location: [place] CV LAB;  Service: Cardiovascular;  Laterality: N/A;   POLYPECTOMY  08/01/2022   Procedure: POLYPECTOMY;  Surgeon: Jerene Bears, MD;  Location: [place];  Service: Gastroenterology;;   SUBMUCOSAL TATTOO INJECTION  08/01/2022   Procedure: SUBMUCOSAL TATTOO INJECTION;  Surgeon: Jerene Bears, MD;  Location: [place];  Service: Gastroenterology;;   TEE WITHOUT CARDIOVERSION N/A 10/14/2020   Procedure: TRANSESOPHAGEAL ECHOCARDIOGRAM (TEE);  Surgeon: [name], MD;  Location: [place];  Service: Cardiovascular;  Laterality: N/A;   ULTRASOUND GUIDANCE FOR VASCULAR ACCESS  06/18/2017   Procedure: Ultrasound Guidance For Vascular Access;  Surgeon: Jettie Booze, MD;  Location: [place] CV LAB;  Service: Cardiovascular;;  right radial, right femoral       MEDICATIONS: Current Outpatient Medications  Medication Instructions   acetaminophen (TYLENOL) 325-650 mg, Oral, Every 6 hours PRN   Alcohol Swabs  (B-D SINGLE USE SWABS REGULAR) PADS Use to check blood sugar 2 times a day   apixaban (ELIQUIS) 5 mg, Oral, 2 times daily   Blood Glucose Calibration (TRUE METRIX LEVEL 1) Low SOLN Use to check control on glucose meter   Blood Glucose Monitoring Suppl (TRUE METRIX AIR GLUCOSE METER) w/Device KIT 1 kit, Other, 2 times daily, Check blood sugar twice daily and as directed. Dx E11.65   Coenzyme Q10 (CO Q 10 PO) 200 mg, Oral, Daily   DROPLET PEN NEEDLES 31G X 6 MM MISC USE ONE TIME DAILY  AT  BEDTIME  WITH  LANTUS   fluticasone (FLONASE) 50 MCG/ACT nasal spray 1 spray, Each Nare, Daily PRN   furosemide (LASIX) 20 mg, Oral, Daily   glipiZIDE (GLUCOTROL) 10 MG tablet TAKE 1 TABLET TWICE DAILY WITH MEALS   isosorbide mononitrate (IMDUR) 60 MG 24 hr tablet TAKE 1 TABLET EVERY DAY   Lantus SoloStar 50 Units, Subcutaneous, Daily at bedtime   metFORMIN (GLUCOPHAGE) 1000 MG tablet TAKE 1 TABLET TWICE DAILY WITH MEALS   metoprolol  tartrate (LOPRESSOR) 12.5 mg, Oral, 2 times daily   nitroGLYCERIN (NITROSTAT) 0.4 mg, Sublingual, Every 5 min PRN   NovoLOG FlexPen 16 Units, Subcutaneous, Daily, Before dinner   pantoprazole (PROTONIX) 40 mg, Oral, Daily   rosuvastatin (CRESTOR) 40 mg, Oral, Daily   TRUE METRIX BLOOD GLUCOSE TEST test strip CHECK BLOOD SUGAR TWICE DAILY   TRUEplus Lancets 30G MISC Check blood sugar twice daily and as directed. Dx E11.65   Vitamin B12 1,000 mcg, Oral, Daily     [START ON 08/18/2022] aspirin  81 mg Oral Daily   pantoprazole (PROTONIX) IV  40 mg Intravenous Q12H   [START ON 08/18/2022] rosuvastatin  40 mg Oral Daily   [START ON 08/18/2022] sodium chloride flush  3 mL Intravenous Q12H   [START ON 08/18/2022] ticagrelor  90 mg Oral BID     [START ON 08/18/2022] sodium chloride     sodium chloride      [START ON 08/18/2022] sodium chloride, acetaminophen, hydrALAZINE, labetalol, nitroGLYCERIN, ondansetron (ZOFRAN) IV, [START ON 08/18/2022] sodium chloride flush    ED Course: Pt in  Ed pt presented with chest pain, to had his staples removed and was doing well earlier,developed chest pain one hour PTA.  Vitals:   08/17/22 1823 08/17/22 1852 08/17/22 1900 08/17/22 1958  BP: (!) 122/59  (!) 122/58   Pulse: 75  84   Resp: 19  20   Temp:  97.7 F (36.5 C)  97.9 F (36.6 C)  TempSrc:  Oral  Oral  SpO2:   99%   Weight:  106.7 kg    Height:  5' 11"$  (1.803 m)      Total I/O In: 22.3 [I.V.:22.3] Out: -  SpO2: 99 % Blood work in ed shows: EKG shows ST elevation in inferior leads and reciprocal depression in lead I and avl.  Results for orders placed or performed during the hospital encounter of 08/17/22 (from the past 24 hour(s))  POCT Activated clotting time     Status: None   Collection Time: 08/17/22  5:47 PM  Result Value Ref Range   Activated Clotting Time 553 seconds  Glucose, capillary     Status: Abnormal   Collection Time: 08/17/22  6:41 PM  Result Value Ref Range   Glucose-Capillary 183 (H) 70 - 99 mg/dL  MRSA Next Gen by PCR, Nasal     Status: None   Collection Time: 08/17/22  6:49 PM   Specimen: Nasal Mucosa; Nasal Swab  Result Value Ref Range   MRSA by PCR Next Gen NOT DETECTED NOT DETECTED    Unresulted Labs (From admission, onward)     Start     Ordered   08/18/22 [id number]  Basic metabolic panel  Tomorrow morning,   R        08/17/22 1825   08/18/22 0500  CBC  Tomorrow morning,   R        08/17/22 1825   08/18/22 0500  Lipoprotein A (LPA)  Tomorrow morning,   R        08/17/22 1825   08/17/22 2024  Type and screen  Once,   R        08/17/22 2023           Pt has received : Orders Placed This Encounter  Procedures   MRSA Next Gen by PCR, Nasal    Standing Status:   Standing    Number of Occurrences:   1   Basic metabolic panel    Standing Status:  Standing    Number of Occurrences:   1   CBC    Standing Status:   Standing    Number of Occurrences:   1   Lipoprotein A (LPA)    Standing Status:   Standing    Number of  Occurrences:   1   Glucose, capillary    Standing Status:   Standing    Number of Occurrences:   1   AMB Referral to Cardiac Rehabilitation - Phase II    Referral Priority:   Routine    Referral Type:   Consultation    Referral Reason:   Specialty Services Required    Number of Visits Requested:   1   Diet heart healthy/carb modified Room service appropriate? Yes; Fluid consistency: Thin    Standing Status:   Standing    Number of Occurrences:   1    Order Specific Question:   Diet-HS Snack?    Answer:   Nothing    Order Specific Question:   Room service appropriate?    Answer:   Yes    Order Specific Question:   Fluid consistency:    Answer:   Thin   Vital signs    Standing Status:   Standing    Number of Occurrences:   1   Cardiac monitoring    Standing Status:   Standing    Number of Occurrences:   1   Apply Cardiac or Vascular Catheterization and/or Intervention Care Plan    Standing Status:   Standing    Number of Occurrences:   1   RN may order Cardiology PRN Orders utilizing "Cardiology PRN medications" (through manage orders) for the following patient needs:    indigestion (Maalox)), cough (Robitussin DM), constipation (MOM), diarrhea (Imodium), hemorrhoids (Tucks), or minor skin irritation (Hydrocortisone Cream)    Standing Status:   Standing    Number of Occurrences:   [phone]   Vital signs Post sheath removal: Assess Pain, BP, HR, vascular site and affected extremity pulse every 15 min X 4, every 30 min X 2, every hour X 2, then per unit routine or discharge order.    Post sheath removal: Assess Pain, BP, HR, vascular site and affected extremity pulse every 15 min X 4, every 30 min X 2, every hour X 2, then per unit routine or discharge order.    Standing Status:   Standing    Number of Occurrences:   1   If bleeding or hematoma occurs:    Apply manual pressure 1-2 cm above incision site for 20 minutes to achieve hemostasis. Assess VS and distal pulses. Document and  notify per hospital procedure.    Standing Status:   Standing    Number of Occurrences:   1   During bedrest: May elevate HOB 30 degrees and tilt to affected side while keeping affected leg straight.    May elevate HOB 30 degrees and tilt to affected side while keeping affected leg straight.    Standing Status:   Standing    Number of Occurrences:   1   After bedrest: OOB to chair with assistance If site remains stable, progress to ambulate in hall.    OOB to chair with assistance If site remains stable, progress to ambulate in hall.    Standing Status:   Standing    Number of Occurrences:   1   After bedrest: Discontinue femoral site dressing and apply bandaid to site.    Discontinue femoral site dressing  and apply bandaid to site.    Standing Status:   Standing    Number of Occurrences:   1   Initiate Oral Care Protocol    Standing Status:   Standing    Number of Occurrences:   1   Initiate Carrier Fluid Protocol    Standing Status:   Standing    Number of Occurrences:   1   Closure Device    Standing Status:   Standing    Number of Occurrences:   1    Order Specific Question:   Closure Device:    Answer:   Mynx   Bed rest    Standing Status:   Standing    Number of Occurrences:   1   Vital signs    Standing Status:   Standing    Number of Occurrences:   1   Notify physician (specify)    Standing Status:   Standing    Number of Occurrences:   20    Order Specific Question:   Notify Physician    Answer:   for pulse less than 55 or greater than 120    Order Specific Question:   Notify Physician    Answer:   for respiratory rate less than 12 or greater than 25    Order Specific Question:   Notify Physician    Answer:   for temperature greater than 100.5 F    Order Specific Question:   Notify Physician    Answer:   for urinary output less than 30 mL/hr for four hours    Order Specific Question:   Notify Physician    Answer:   for systolic BP less than 90 or greater than 0000000,  diastolic BP less than 60 or greater than 100    Order Specific Question:   Notify Physician    Answer:   for new hypoxia w/ oxygen saturations < 88%   Progressive Mobility Protocol: No Restrictions    Standing Status:   Standing    Number of Occurrences:   1   Initiate Oral Care Protocol    Standing Status:   Standing    Number of Occurrences:   1   Initiate Carrier Fluid Protocol    Standing Status:   Standing    Number of Occurrences:   1   RN may order General Admission PRN Orders utilizing "General Admission PRN medications" (through manage orders) for the following patient needs: allergy symptoms (Claritin), cold sores (Carmex), cough (Robitussin DM), eye irritation (Liquifilm Tears), hemorrhoids (Tucks), indigestion (Maalox), minor skin irritation (Hydrocortisone Cream), muscle pain ([place]), nose irritation (saline nasal spray) and sore throat (Chloraseptic spray).    Standing Status:   Standing    Number of Occurrences:   [phone]   SCDs    Standing Status:   Standing    Number of Occurrences:   1    Order Specific Question:   Laterality    Answer:   Bilateral   Intake and Output    Standing Status:   Standing    Number of Occurrences:   1   Swab Process:     1.  Use the double-swab Venturi Transystem Transport Swab (green writing) to collect the specimen. 2.  Insert the dry culture swabs 1-2 cm into the patient's nostril and rotate the swabs against the inside of the nostril for 3 seconds while applying pressure with a finger to the outside of the nose. 3.  Repeat Step 2 in second  nostril with the same swabs. 4.  Return the swabs to the tube. 5.  Label the specimen at the bedside and send immediately to the laboratory.    Standing Status:   Standing    Number of Occurrences:   1   Considerations:    If a patient has had ENT surgery or has facial trauma with packing in place, coordinate with the physician to obtain the nasal swabs.  Other body sites may not be used to screen  for MRSA.    Standing Status:   Standing    Number of Occurrences:   1   If MRSA PCR Screen is Positive:     Initiate Methicillin Resistant Staphylococcus aureus (MRSA) PCR Positive Standing Orders.    Standing Status:   Standing    Number of Occurrences:   1   Full code    Standing Status:   Standing    Number of Occurrences:   1    Order Specific Question:   By:    Answer:   Other   Oxygen therapy Mode or (Route): Nasal cannula; Liters Per Minute: 2; Keep 02 saturation: greater than 92 %    Standing Status:   Standing    Number of Occurrences:   1    Order Specific Question:   Mode or (Route)    Answer:   Nasal cannula    Order Specific Question:   Liters Per Minute    Answer:   2    Order Specific Question:   Keep 02 saturation    Answer:   greater than 92 %   POCT Activated clotting time    Standing Status:   Standing    Number of Occurrences:   1   EKG 12-Lead immediately post procedure    Standing Status:   Standing    Number of Occurrences:   1    Order Specific Question:   Notes    Answer:   Post PCI   EKG 12-Lead    Standing Status:   Standing    Number of Occurrences:   1    Order Specific Question:   Notes    Answer:   Post PCI   ECHOCARDIOGRAM COMPLETE    Standing Status:   Standing    Number of Occurrences:   1    Order Specific Question:   Perflutren DEFINITY (image enhancing agent) should be administered unless hypersensitivity or allergy exist    Answer:   Administer Perflutren    Order Specific Question:   Will [facility] be the location of this test?    Answer:   Yes    Order Specific Question:   Please indicate who you request to read the nuc med / echo results.    Answer:   [facility] Read[facility]    Order Specific Question:   Reason for exam-Echo    Answer:   Acute myocardial infarction, unspecified I21.9    Order Specific Question:   Is a special reader required? (athlete or structural heart)    Answer:   No    Order Specific Question:   Does  this study need to be read by the Structural team/Level 3 readers?    Answer:   No    Order Specific Question:   Release to patient    Answer:   Immediate   Type and screen    Standing Status:   Standing    Number of Occurrences:   1   Admit to  Inpatient (patient's expected length of stay will be greater than 2 midnights or inpatient only procedure)    Standing Status:   Standing    Number of Occurrences:   1    Order Specific Question:   Hospital Area    Answer:   [place] [[id number]]    Order Specific Question:   Level of Care    Answer:   ICU [6]    Order Specific Question:   Covid Evaluation    Answer:   Asymptomatic - no recent exposure (last 10 days) testing not required    Order Specific Question:   Diagnosis    Answer:   STEMI involving right coronary artery [facility]) [id number]    Order Specific Question:   Admitting Physician    Answer:   Yolonda Kida [id number]    Order Specific Question:   Attending Physician    Answer:   Cherylann Ratel    Order Specific Question:   Certification:    Answer:   I certify this patient will need inpatient services for at least 2 midnights    Order Specific Question:   Estimated Length of Stay    Answer:   2   Admit to Inpatient (patient's expected length of stay will be greater than 2 midnights or inpatient only procedure)    Standing Status:   Standing    Number of Occurrences:   1    Order Specific Question:   Hospital Area    Answer:   [place] [[id number]]    Order Specific Question:   Level of Care    Answer:   ICU [6]    Order Specific Question:   Covid Evaluation    Answer:   Asymptomatic - no recent exposure (last 10 days) testing not required    Order Specific Question:   Diagnosis    Answer:   Acute ST elevation myocardial infarction (STEMI) [facility]) [id number]    Order Specific Question:   Admitting Physician    Answer:   Cherylann Ratel    Order Specific Question:    Attending Physician    Answer:   Cherylann Ratel    Order Specific Question:   Certification:    Answer:   I certify this patient will need inpatient services for at least 2 midnights   Aspiration precautions    Standing Status:   Standing    Number of Occurrences:   1   Fall precautions    Standing Status:   Standing    Number of Occurrences:   1    Meds ordered this encounter  Medications   heparin sodium (porcine) injection 6,000 Units   ticagrelor (BRILINTA) tablet 180 mg   DISCONTD: fentaNYL (SUBLIMAZE) injection   DISCONTD: midazolam (VERSED) injection   DISCONTD: bivalirudin (ANGIOMAX) BOLUS via infusion   bivalirudin (ANGIOMAX) 250 mg in sodium chloride 0.9 % 50 mL (5 mg/mL) infusion   DISCONTD: lidocaine (PF) (XYLOCAINE) 1 % injection   DISCONTD: Heparin (Porcine) in NaCl 1000-0.9 UT/500ML-% SOLN   DISCONTD: iohexol (OMNIPAQUE) 300 MG/ML solution   labetalol (NORMODYNE) injection 10 mg   hydrALAZINE (APRESOLINE) injection 10 mg   acetaminophen (TYLENOL) tablet 650 mg   ondansetron (ZOFRAN) injection 4 mg   0.9% sodium chloride infusion   sodium chloride flush (NS) 0.9 % injection 3 mL   sodium chloride flush (NS) 0.9 % injection 3 mL   0.9 %  sodium chloride infusion  aspirin chewable tablet 81 mg   ticagrelor (BRILINTA) tablet 90 mg   bivalirudin (ANGIOMAX) 250 mg in sodium chloride 0.9 % 50 mL (5 mg/mL) infusion   nitroGLYCERIN (NITROSTAT) SL tablet 0.4 mg   rosuvastatin (CRESTOR) tablet 40 mg   pantoprazole (PROTONIX) injection 40 mg     Admission Imaging : No results found.    Physical Examination: Vitals:   08/17/22 1823 08/17/22 1852 08/17/22 1900 08/17/22 1958  BP: (!) 122/59  (!) 122/58   Pulse: 75  84   Temp:  97.7 F (36.5 C)  97.9 F (36.6 C)  Resp: 19  20   Height:  5' 11"$  (1.803 m)    Weight:  106.7 kg    SpO2:   99%   TempSrc:  Oral  Oral  BMI (Calculated):  32.82     Physical Exam Vitals and nursing note reviewed.   Constitutional:      General: He is not in acute distress.    Appearance: Normal appearance. He is not ill-appearing, toxic-appearing or diaphoretic.  HENT:     Head: Normocephalic and atraumatic.     Right Ear: Hearing and external ear normal.     Left Ear: Hearing and external ear normal.     Nose: Nose normal. No nasal deformity.     Mouth/Throat:     Lips: Pink.     Mouth: Mucous membranes are moist.     Tongue: No lesions.     Pharynx: Oropharynx is clear.  Eyes:     Extraocular Movements: Extraocular movements intact.  Neck:     Vascular: No carotid bruit.  Cardiovascular:     Rate and Rhythm: Normal rate. Rhythm irregular.     Pulses: Normal pulses.     Heart sounds: Normal heart sounds.  Pulmonary:     Effort: Pulmonary effort is normal.     Breath sounds: Normal breath sounds.  Abdominal:     General: Bowel sounds are normal. There is no distension.     Palpations: Abdomen is soft. There is no mass.     Tenderness: There is no abdominal tenderness. There is no guarding.     Hernia: No hernia is present.  Musculoskeletal:     Right lower leg: No edema.     Left lower leg: No edema.  Skin:    General: Skin is warm.  Neurological:     General: No focal deficit present.     Mental Status: He is alert and oriented to person, place, and time.     Cranial Nerves: Cranial nerves 2-12 are intact.     Motor: Motor function is intact.  Psychiatric:        Attention and Perception: Attention normal.        Mood and Affect: Mood normal.        Speech: Speech normal.        Behavior: Behavior normal. Behavior is cooperative.        Cognition and Memory: Cognition normal.       Assessment and Plan: * STEMI involving right coronary artery (HCC) Pt s/p pci and stent.  Medication plan per cardiology as far as antiplatelet therapy.  We will Conti statin and prn NTG. Dr. Clayborn Bigness did procedure pt is on [facility]  cardiology service.   Electrolyte abnormality Replace and  follow levels.   Ascending colon malignant neoplasm (HCC) S/P removal and stitches removed today.  Pt followed by gi and gen surg.   Anemia  Latest Ref Rng & Units 08/11/2022   12:19 AM 08/10/2022    6:52 AM 08/09/2022   12:19 AM  CBC  WBC 4.0 - 10.5 K/uL 8.5  10.4  13.2   Hemoglobin 13.0 - 17.0 g/dL 8.7  8.8  8.8   Hematocrit 39.0 - 52.0 % 28.9  29.5  29.2   Platelets 150 - 400 K/uL 206  195  206   Pt has colon cancer diagnosis recently and will defer to oncology hematology to follow evaluate and manage his anemia. Currently we will follow and type and screen.   AKI (acute kidney injury) [facility]) Lab Results  Component Value Date   CREATININE 1.26 (H) 08/11/2022   CREATININE 1.14 08/10/2022   CREATININE 1.28 (H) 08/09/2022  Will avoid any nephrotoxic meds / contrast.  Renally dose meds.     Subclinical hypothyroidism TSH/ FT4/ LIPIDs/   GERD (gastroesophageal reflux disease) Cont Protonix.   BPH (benign prostatic hyperplasia) No symptoms.  No BPH meds. Foley PRN.    DVT prophylaxis:  Full code [facility] per cards for STEMI.  Code Status:  Full Code   Family Communication:  Latonya, Maleki (Spouse)  [phone]   Disposition Plan:  Home.    Consults called:  Cardiology:[facility]. Admission status: Inpatient.    Unit/ Expected LOS: ICU/ 1 day.   Para Skeans MD [facility]  6 PM- 2 AM. Please contact me via secure Chat 6 PM-2 AM. [phone]( Pager ) To contact the [facility] Attending or Consulting provider [place] or covering provider during after hours [place], for this patient.   Check the care team in [facility] and look for a) attending/consulting [facility] provider listed and b) the [facility] team listed Log into [email or web address] and use [place]'s universal password to access. If you do not have the password, please contact the hospital operator. Locate the [facility] provider you are looking for under [facility] and page to a number that you can be directly reached. If you  still have difficulty reaching the provider, please page the [facility] (Director on Call) for the Hospitalists listed on amion for assistance. [email or web address] 08/17/2022, 8:24 PM

## 2022-08-17 NOTE — ED Notes (Signed)
Pt to cath lab at 1709.

## 2022-08-17 NOTE — ED Provider Notes (Signed)
[facility] Provider Note    Event Date/Time   First MD Initiated Contact with Patient 08/17/22 1710     (approximate)   History   No chief complaint on file.   HPI  Ryan Garcia is a 89 y.o. male past medical history of recent colectomy, coronary artery disease status post PCI, diabetes who presents because of chest pain.  Patient developed substernal chest pressure about an hour prior to arrival.  Endorses some dyspnea no nausea or diaphoresis.  Code STEMI was activated from the field due to inferior STEMI on EKG.     Past Medical History:  Diagnosis Date   AC (acromioclavicular) joint bone spurs    lt ankle   Allergy    allergic rhinitis   Anginal pain (HCC)    Arthritis    OA   BPH (benign prostatic hyperplasia)    microwave tx of prostate   CAD (coronary artery disease)    a. s/p cath in 2012 showing 70-75% LAD stenosis, 75% D1 stenosis, 75% OM1, 60-70% RCA stenosis, and 80% PDA --> medical therapy pursued b. similar findings by cath in 2016; 06/2017 DES to distal RCA   Chronic kidney disease    Chronic upper back pain    Diabetes mellitus    type II x8 years   GERD (gastroesophageal reflux disease)    HLD (hyperlipidemia)    10/12: TC 208, TG 213, HDL 36, LDL 129   Hx of skin cancer, basal cell    many skin cancers removed/under constant treatment.   Hypertension    NSTEMI (non-ST elevated myocardial infarction) ([place]) 06/18/2017   DES to distal RCA   Obstructive sleep apnea on CPAP    Paroxysmal atrial flutter ([place])    Renal mass    Scoliosis     Patient Active Problem List   Diagnosis Date Noted   Ascending colon malignant neoplasm ([place]) 08/01/2022   Benign neoplasm of transverse colon 08/01/2022   Benign neoplasm of sigmoid colon 08/01/2022   Benign neoplasm of rectum 08/01/2022   Duodenitis 08/01/2022   Right shoulder pain 11/21/2021   Carpal tunnel syndrome 11/21/2021   Atrial flutter with rapid ventricular response (HCC)     Rapid atrial fibrillation ([place]) 10/11/2020   Poor balance 07/26/2020   Anemia 04/20/2020   Renal mass, right 04/18/2020   AKI (acute kidney injury) ([place]) 04/14/2020   Cholangitis 04/14/2020   Unstable angina (HCC)    Chest pain 04/11/2020   Coronary artery disease involving native coronary artery of native heart without angina pectoris 01/18/2019   Medicare annual wellness visit, subsequent 01/16/2019   Obesity (BMI 30-39.9) 07/09/2018   HOH (hard of hearing) 03/31/2018   Uncontrolled type 2 diabetes mellitus with hyperglycemia ([place]) 10/28/2017   Subclinical hypothyroidism 10/28/2017   Scoliosis    Obstructive sleep apnea on CPAP    Hx of skin cancer, basal cell    Hyperlipidemia associated with type 2 diabetes mellitus (HCC)    GERD (gastroesophageal reflux disease)    Chronic upper back pain    Arthritis    Allergy    NSTEMI (non-ST elevated myocardial infarction) ([place]) 06/18/2017   Actinic keratoses 04/29/2017   Positive colorectal cancer screening using Cologuard test 10/31/2016   BPH (benign prostatic hyperplasia) 09/04/2016   B12 deficiency 11/22/2015   Routine general medical examination at a health care facility 08/26/2015   Abnormal nuclear cardiac imaging test    Dyspnea on exertion 11/27/2013   Encounter for examination of  normal volunteer in research study 05/29/2013   Prostate cancer screening 07/15/2012   CAD S/P percutaneous coronary angioplasty 05/17/2011   Nonspecific abnormal results of cardiovascular function study 04/30/2011   Abnormal EKG 04/06/2011   Right low back pain 01/13/2010   Insulin dependent diabetes mellitus 10/04/2006   ERECTILE DYSFUNCTION 10/04/2006   Primary hypertension 10/04/2006   Allergic rhinitis 10/04/2006   OSTEOARTHRITIS 10/04/2006   Sleep apnea 10/04/2006   EDEMA 10/04/2006   SKIN CANCER, HX OF 10/04/2006     Physical Exam  Triage Vital Signs: ED Triage Vitals  Enc Vitals Group     BP      Pulse      Resp      Temp       Temp src      SpO2      Weight      Height      Head Circumference      Peak Flow      Pain Score      Pain Loc      Pain Edu?      Excl. in [place]?     Most recent vital signs: There were no vitals filed for this visit.   General: Awake, no distress.  CV:  Good peripheral perfusion.  Resp:  Normal effort.  Abd:  No distention.  Vertical abdominal incision appears to be healing, Steri-Strips in place Neuro:             Awake, Alert, Oriented x 3  Other:  2+ DP pulses bilateral lower extremities   ED Results / Procedures / Treatments  Labs (all labs ordered are listed, but only abnormal results are displayed) Labs Reviewed - No data to display   EKG  Reviewed EKG from the field which shows inferior ST elevation with reciprocal depression in 1 aVL   RADIOLOGY    PROCEDURES:  Critical Care performed: Yes, see critical care procedure note(s)  Procedures  The patient is on the cardiac monitor to evaluate for evidence of arrhythmia and/or significant heart rate changes.   MEDICATIONS ORDERED IN ED: Medications - No data to display   IMPRESSION / MDM / [place] / ED COURSE  I reviewed the triage vital signs and the nursing notes.                              Patient's presentation is most consistent with acute presentation with potential threat to life or bodily function.  Differential diagnosis includes, but is not limited to, acute MI, less likely myocarditis/pericarditis, aortic dissection, pulmonary embolism  Patient is an 89 year old male who presents with 1 hour of crushing chest pain with field EKG showing inferior ST elevation.  STEMI was activated from the field.  On arrival to ED patient is mentating normally he is still complaining of ongoing chest pain although improved.  Received aspirin in the field as well as give himself 3 nitroglycerin.  Was hypotensive 90s over 9s in the field and was receiving fluid currently.  Dr. Clayborn Bigness is at  bedside.  Plan to take patient directly to the Cath Lab.       FINAL CLINICAL IMPRESSION(S) / ED DIAGNOSES   Final diagnoses:  ST elevation myocardial infarction (STEMI), unspecified artery ([place])     Rx / DC Orders   ED Discharge Orders     None        Note:  This document was  prepared using Systems analyst and may include unintentional dictation errors.   Rada Hay, MD 08/17/22 [phone]

## 2022-08-18 ENCOUNTER — Other Ambulatory Visit: Payer: Self-pay

## 2022-08-18 ENCOUNTER — Encounter: Payer: Self-pay | Admitting: Internal Medicine

## 2022-08-18 ENCOUNTER — Inpatient Hospital Stay (HOSPITAL_COMMUNITY)
Admit: 2022-08-18 | Discharge: 2022-08-18 | Disposition: A | Discharge status: Home / Self Care | Payer: Medicare HMO | Attending: Internal Medicine | Admitting: Internal Medicine

## 2022-08-18 DIAGNOSIS — I480 Paroxysmal atrial fibrillation: Secondary | ICD-10-CM

## 2022-08-18 DIAGNOSIS — I2111 ST elevation (STEMI) myocardial infarction involving right coronary artery: Secondary | ICD-10-CM | POA: Diagnosis not present

## 2022-08-18 DIAGNOSIS — C182 Malignant neoplasm of ascending colon: Secondary | ICD-10-CM | POA: Diagnosis not present

## 2022-08-18 DIAGNOSIS — D649 Anemia, unspecified: Secondary | ICD-10-CM | POA: Diagnosis not present

## 2022-08-18 DIAGNOSIS — N179 Acute kidney failure, unspecified: Secondary | ICD-10-CM | POA: Diagnosis not present

## 2022-08-18 LAB — CBC WITH DIFFERENTIAL/PLATELET
Absolute Monocytes: 836 cells/uL (ref 200–950)
Basophils Absolute: 49 cells/uL (ref 0–200)
Basophils Relative: 0.6 %
Eosinophils Absolute: 328 cells/uL (ref 15–500)
Eosinophils Relative: 4 %
HCT: 32.9 % — ABNORMAL LOW (ref 38.5–50.0)
Hemoglobin: 10.1 g/dL — ABNORMAL LOW (ref 13.2–17.1)
Lymphs Abs: 1525 cells/uL (ref 850–3900)
MCH: 25.3 pg — ABNORMAL LOW (ref 27.0–33.0)
MCHC: 30.7 g/dL — ABNORMAL LOW (ref 32.0–36.0)
MCV: 82.5 fL (ref 80.0–100.0)
MPV: 9.7 fL (ref 7.5–12.5)
Monocytes Relative: 10.2 %
Neutro Abs: 5461 cells/uL (ref 1500–7800)
Neutrophils Relative %: 66.6 %
Platelets: 442 10*3/uL — ABNORMAL HIGH (ref 140–400)
RBC: 3.99 10*6/uL — ABNORMAL LOW (ref 4.20–5.80)
RDW: 17.4 % — ABNORMAL HIGH (ref 11.0–15.0)
Total Lymphocyte: 18.6 %
WBC: 8.2 10*3/uL (ref 3.8–10.8)

## 2022-08-18 LAB — CBC
HCT: 29.5 % — ABNORMAL LOW (ref 39.0–52.0)
Hemoglobin: 8.7 g/dL — ABNORMAL LOW (ref 13.0–17.0)
MCH: 24.8 pg — ABNORMAL LOW (ref 26.0–34.0)
MCHC: 29.5 g/dL — ABNORMAL LOW (ref 30.0–36.0)
MCV: 84 fL (ref 80.0–100.0)
Platelets: 342 10*3/uL (ref 150–400)
RBC: 3.51 MIL/uL — ABNORMAL LOW (ref 4.22–5.81)
RDW: 19.1 % — ABNORMAL HIGH (ref 11.5–15.5)
WBC: 8.5 10*3/uL (ref 4.0–10.5)
nRBC: 0 % (ref 0.0–0.2)

## 2022-08-18 LAB — ECHOCARDIOGRAM COMPLETE
AR max vel: 2.63 cm2
AV Area VTI: 2.92 cm2
AV Area mean vel: 2.34 cm2
AV Mean grad: 6 mmHg
AV Peak grad: 9.7 mmHg
Ao pk vel: 1.56 m/s
Area-P 1/2: 2.82 cm2
Calc EF: 61.9 %
Height: 71 in
MV VTI: 2.17 cm2
S' Lateral: 3.2 cm
Single Plane A2C EF: 59.3 %
Single Plane A4C EF: 62.5 %
Weight: 3763.69 oz

## 2022-08-18 LAB — GLUCOSE, CAPILLARY
Glucose-Capillary: 233 mg/dL — ABNORMAL HIGH (ref 70–99)
Glucose-Capillary: 261 mg/dL — ABNORMAL HIGH (ref 70–99)
Glucose-Capillary: 270 mg/dL — ABNORMAL HIGH (ref 70–99)

## 2022-08-18 LAB — COMPREHENSIVE METABOLIC PANEL
AG Ratio: 0.9 (calc) — ABNORMAL LOW (ref 1.0–2.5)
ALT: 112 U/L — ABNORMAL HIGH (ref 9–46)
AST: 101 U/L — ABNORMAL HIGH (ref 10–35)
Albumin: 2.9 g/dL — ABNORMAL LOW (ref 3.6–5.1)
Alkaline phosphatase (APISO): 181 U/L — ABNORMAL HIGH (ref 35–144)
BUN/Creatinine Ratio: 11 (calc) (ref 6–22)
BUN: 14 mg/dL (ref 7–25)
CO2: 20 mmol/L (ref 20–32)
Calcium: 8.3 mg/dL — ABNORMAL LOW (ref 8.6–10.3)
Chloride: 106 mmol/L (ref 98–110)
Creat: 1.31 mg/dL — ABNORMAL HIGH (ref 0.70–1.22)
Globulin: 3.4 g/dL (calc) (ref 1.9–3.7)
Glucose, Bld: 210 mg/dL — ABNORMAL HIGH (ref 65–99)
Potassium: 4.5 mmol/L (ref 3.5–5.3)
Sodium: 142 mmol/L (ref 135–146)
Total Bilirubin: 0.3 mg/dL (ref 0.2–1.2)
Total Protein: 6.3 g/dL (ref 6.1–8.1)

## 2022-08-18 LAB — BASIC METABOLIC PANEL
Anion gap: 6 (ref 5–15)
BUN: 15 mg/dL (ref 8–23)
CO2: 24 mmol/L (ref 22–32)
Calcium: 8.3 mg/dL — ABNORMAL LOW (ref 8.9–10.3)
Chloride: 110 mmol/L (ref 98–111)
Creatinine, Ser: 1.12 mg/dL (ref 0.61–1.24)
GFR, Estimated: >60 mL/min (ref >60)
Glucose, Bld: 184 mg/dL — ABNORMAL HIGH (ref 70–99)
Potassium: 4.1 mmol/L (ref 3.5–5.1)
Sodium: 140 mmol/L (ref 135–145)

## 2022-08-18 LAB — TROPONIN I (HIGH SENSITIVITY): Troponin I (High Sensitivity): >24000 ng/L (ref <18)

## 2022-08-18 LAB — HEMOGLOBIN AND HEMATOCRIT, BLOOD
HCT: 30.7 % — ABNORMAL LOW (ref 39.0–52.0)
Hemoglobin: 8.9 g/dL — ABNORMAL LOW (ref 13.0–17.0)

## 2022-08-18 MED ORDER — METOPROLOL TARTRATE 25 MG PO TABS
12.5000 mg | ORAL_TABLET | Freq: Two times a day (BID) | ORAL | Status: DC
  Administered 2022-08-18 – 2022-08-19 (×3): 12.5 mg via ORAL
  Filled 2022-08-18 (×3): qty 1

## 2022-08-18 MED ORDER — APIXABAN 5 MG PO TABS
5.0000 mg | ORAL_TABLET | Freq: Two times a day (BID) | ORAL | Status: DC
  Administered 2022-08-18 – 2022-08-19 (×2): 5 mg via ORAL
  Filled 2022-08-18 (×2): qty 1

## 2022-08-18 MED ORDER — INSULIN ASPART 100 UNIT/ML IJ SOLN
0.0000 [IU] | Freq: Every day | INTRAMUSCULAR | Status: DC
  Administered 2022-08-18: 3 [IU] via SUBCUTANEOUS
  Filled 2022-08-18: qty 1

## 2022-08-18 MED ORDER — INSULIN ASPART 100 UNIT/ML IJ SOLN
0.0000 [IU] | Freq: Three times a day (TID) | INTRAMUSCULAR | Status: DC
  Administered 2022-08-18: 3 [IU] via SUBCUTANEOUS
  Administered 2022-08-18: 5 [IU] via SUBCUTANEOUS
  Administered 2022-08-19: 2 [IU] via SUBCUTANEOUS
  Filled 2022-08-18 (×3): qty 1

## 2022-08-18 NOTE — Assessment & Plan Note (Signed)
Currently in sinus rhythm Continue metoprolol Cardio planning resumption of Eliquis and transition from Brilinta to Plavix tomorrow

## 2022-08-18 NOTE — Progress Notes (Signed)
We will transition the patient from ASA and Briltina to Eliquis and Plavix on the morning of 08/19/2022 after discussion with MD.

## 2022-08-18 NOTE — Assessment & Plan Note (Signed)
Currently in sinus rhythm Continue metoprolol Cardio planning ASA and Briltina to Eliquis and Plavix tomorrow

## 2022-08-18 NOTE — Assessment & Plan Note (Addendum)
Status post successful PCI in stenting to RCA Cardiology planning to transition ASA and Briltina to Eliquis and Plavix  tomorrow Continue metoprolol and statin echo within normal limits Cardiac rehab at discharge

## 2022-08-18 NOTE — Progress Notes (Addendum)
  Progress Note   Patient: Ryan Garcia [id number] DOB: 12-Jan-1934 DOA: 08/17/2022     1 DOS: the patient was seen and examined on 08/18/2022   Brief hospital course: 89 y.o. male with a hx of multivessel CAD as outlined below, paroxysmal atrial flutter on apixaban status post TEE guided DCCV in 09/2020, recently diagnosed adenocarcinoma of the colon status post surgical resection on 08/03/2022, iron deficiency anemia, HFpEF, right renal mass concerning for renal cell carcinoma, HTN, HLD, and OSA on CPAP was admitted for inferior STEMI   2/16: Emergent cardiac cath status post successful PCI and stenting of RCA  2/17: Transfer to PCU  Assessment and Plan: * STEMI involving right coronary artery [facility]) Status post successful PCI in stenting to RCA Cardiology planning to transition ASA and Briltina to Eliquis and Plavix  tomorrow Continue metoprolol and statin echo within normal limits Cardiac rehab at discharge  Paroxysmal atrial flutter (HCC) Currently in sinus rhythm Continue metoprolol Cardio planning ASA and Briltina to Eliquis and Plavix tomorrow  Electrolyte abnormality Replete and recheck  Ascending colon malignant neoplasm ([place]) S/P removal and stitches removed  S/p surgical resection on 08/03/2022 followed by oncology as an outpatient  Anemia Likely of chronic disease due to underlying cancer.  He also has right renal mass Outpatient oncology follow-up.  Will transfuse if hemoglobin drops less than 8  Right renal mass Concerning for cancer.  Outpatient follow-up with oncology Will consult palliative care for goals of care conversation\ Last palliative care on 08/11/2022 says DNR.  I am assuming likely for cath he was changed to full code.  Will discuss goals of care and DNR status tomorrow and request palliative care consultation.  Overall poor prognosis  AKI (acute kidney injury) ([place]) Lab Results  Component Value Date   CREATININE 1.26 (H) 08/11/2022   CREATININE 1.14  08/10/2022   CREATININE 1.28 (H) 08/09/2022  Will avoid any nephrotoxic meds / contrast.  Renally dose meds.  Likely prerenal    Subclinical hypothyroidism Recheck TSH   GERD (gastroesophageal reflux disease) Cont Protonix.   BPH (benign prostatic hyperplasia) No symptoms.          Subjective: Feeling much better, denies any pain.  Wants to eat  Physical Exam: Vitals:   08/18/22 1000 08/18/22 1100 08/18/22 1200 08/18/22 1218  BP: 107/73 (!) 127/55 (!) 117/55   Pulse: (!) 153 (!) 53 73   Resp: 19 14 (!) 25   Temp:    97.6 F (36.4 C)  TempSrc:    Oral  SpO2: 100% 98% 96%   Weight:      Height:       89 year old male lying in the bed comfortably without any acute distress Lungs clear to auscultation bilaterally Cardiovascular irregularly irregular heart sounds, no murmur rubs or gallop Abdomen: Vertical abdominal incision appears to be healing and has Steri-Strips and dressing in place.  Benign, soft Neuro alert and awake, nonfocal Psych no mood and affect Data Reviewed:  Hemoglobin 8.7, troponin peaked more than 24,000  Family Communication: Wife updated at bedside  Disposition: Status is: Inpatient Remains inpatient appropriate because: Management of inferior STEMI  Planned Discharge Destination: Home with Home Health   DVT prophylaxis-SCDs Time spent: 35 minutes  Author: Max Sane, MD 08/18/2022 1:21 PM  For on call review [email or web address].

## 2022-08-18 NOTE — Assessment & Plan Note (Addendum)
Concerning for cancer.  Outpatient follow-up with oncology Will consult palliative care for goals of care conversation\ Last palliative care on 08/11/2022 says DNR.  I am assuming likely for cath he was changed to full code.  Will discuss goals of care and DNR status tomorrow and request palliative care consultation.  Overall poor prognosis

## 2022-08-18 NOTE — Consult Note (Signed)
Cardiology Consultation:   Patient ID: Ryan Garcia; [id number]; 03-07-1934   Admit date: 08/17/2022 Date of Consult: 08/18/2022  Primary Care Provider: Abner Greenspan, MD Primary Cardiologist: Stanford Breed Primary Electrophysiologist:  None   Patient Profile:   Ryan Garcia is a 89 y.o. male with a hx of multivessel CAD as outlined below, paroxysmal atrial flutter on apixaban status post TEE guided DCCV in 09/2020, recently diagnosed adenocarcinoma of the colon status post surgical resection on 08/03/2022, iron deficiency anemia, HFpEF, right renal mass concerning for renal cell carcinoma, HTN, HLD, and OSA on CPAP who is being seen today for the evaluation of inferior STEMI at the request of Dr. Posey Pronto.  History of Present Illness:   Mr. Bodiford underwent remote cardiac cath which showed multivessel CAD with medical management being advised as the patient preferred to avoid CABG.  He was admitted in 06/2017 with an NSTEMI.  Repeat LHC showed severe three-vessel CAD with the left sided disease being unchanged when compared to prior with a new RCA lesion status post PCI/DES.  Repeat cath in 2021 showed multivessel CAD with a patent stent in the RCA.  There were no targets for PCI.  Medical therapy was recommended with consideration for CABG if refractory angina.  Echo in 2022 showed normal LV systolic function with basal inferior hypokinesis and severe LVH.  He underwent successful TEE guided DCCV in 09/2020.  He was recently admitted to [facility] in 07/2022 with chest discomfort associated with exertion.  He was noted to be anemic with a hemoglobin of 8.7 from baseline around 13.  Apixaban was held.  High-sensitivity troponin was cycled, peaking at 651.  Cardiology was consulted with echo showing an EF of 60 to 65%, no regional wall motion abnormalities, and a mildly elevated PASP.  Elevated troponin was suspected to be supply/demand ischemia in the setting of acute anemia.  CT of the chest, abdomen,  and pelvis showed a 2.5 x 2.6 cm enhancing lesion in the anterior right upper kidney grossly unchanged from prior MRI compatible with solid renal neoplasm such as renal cell carcinoma.  EGD was largely unrevealing.  Colonoscopy showed malignant tumor in the mid descending colon.  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  Prior to discharge, he was resumed on apixaban.   He was seen by his PCP on 08/17/2022 for hospital follow-up of his recent colectomy.  Upon arriving back home, approximately 1 hour later, he developed acute onset of substernal chest pressure with associated dyspnea.  EMS was contacted with code STEMI being activated in the field.  Patient was met in the ED by Dr. Clayborn Bigness and the ED physician with EKG suggestive of inferior ST elevation MI with reciprocal changes laterally.  In the ED, he was given heparin, aspirin, and Brilinta.  He was brought emergently to the cardiac cath lab.  He underwent emergent cardiac cath via right femoral approach with LV gram showing preserved LV systolic function with borderline inferior hypokinesis.  A large RCA was totally occluded from the mid to distal vessel with TIMI 0 flow.  The left main had mild luminal irregularities.  The LAD had diffuse, moderate to high-grade stenosis with TIMI-3 flow.  LCx was large with diffuse moderate to high-grade stenosis with TIMI-3 flow.  He underwent successful PCI/DES to the mid to distal RCA without complication with 0% residual stenosis and TIMI-3  flow.  Post cath, he was admitted to the ICU where he has remained hemodynamically stable and without symptoms of angina or cardiac decompensation.  EKG unavailable for review in Epic or paper chart.  High-sensitivity troponin greater than 24,000.  Remaining labs are notable for a hemoglobin of 8.7  this morning which appears to be consistent with his prior readings dating back to late 07/2022.  On telemetry, he remains in sinus rhythm with frequent PACs and occasional isolated PVC.    Past Medical History:  Diagnosis Date   AC (acromioclavicular) joint bone spurs    lt ankle   Allergy    allergic rhinitis   Anginal pain (HCC)    Arthritis    OA   BPH (benign prostatic hyperplasia)    microwave tx of prostate   CAD (coronary artery disease)    a. s/p cath in 2012 showing 70-75% LAD stenosis, 75% D1 stenosis, 75% OM1, 60-70% RCA stenosis, and 80% PDA --> medical therapy pursued b. similar findings by cath in 2016; 06/2017 DES to distal RCA   Chronic kidney disease    Chronic upper back pain    Diabetes mellitus    type II x8 years   GERD (gastroesophageal reflux disease)    HLD (hyperlipidemia)    10/12: TC 208, TG 213, HDL 36, LDL 129   Hx of skin cancer, basal cell    many skin cancers removed/under constant treatment.   Hypertension    NSTEMI (non-ST elevated myocardial infarction) ([place]) 06/18/2017   DES to distal RCA   Obstructive sleep apnea on CPAP    Paroxysmal atrial flutter ([place])    Renal mass    Scoliosis     Past Surgical History:  Procedure Laterality Date   BIOPSY  08/01/2022   Procedure: BIOPSY;  Surgeon: Jerene Bears, MD;  Location: [facility] ENDOSCOPY;  Service: Gastroenterology;;   BREAST SURGERY  2009   breast lump removed benign   BUBBLE STUDY  10/14/2020   Procedure: BUBBLE STUDY;  Surgeon: [name], MD;  Location: [place];  Service: Cardiovascular;;   CARDIAC CATHETERIZATION N/A 03/15/2015   Procedure: Left Heart Cath and Coronary Angiography;  Surgeon: Belva Crome, MD;  Location: [place] CV LAB;  Service: Cardiovascular;  Laterality: N/A;   CARDIOVERSION N/A 10/14/2020   Procedure: CARDIOVERSION;  Surgeon: [name], MD;  Location: [facility] ENDOSCOPY;  Service: Cardiovascular;  Laterality: N/A;   CATARACT EXTRACTION W/PHACO Right  11/29/2021   Procedure: CATARACT EXTRACTION PHACO AND INTRAOCULAR LENS PLACEMENT ([place]) RIGHT DIABETIC;  Surgeon: Leandrew Koyanagi, MD;  Location: [place];  Service: Ophthalmology;  Laterality: Right;  Diabetic 18.72 01:54.3   COLONOSCOPY N/A 08/01/2022   Procedure: COLONOSCOPY;  Surgeon: Jerene Bears, MD;  Location: [place];  Service: Gastroenterology;  Laterality: N/A;   CORONARY STENT INTERVENTION N/A 06/18/2017   Procedure: CORONARY STENT INTERVENTION;  Surgeon: Jettie Booze, MD;  Location: [place] CV LAB;  Service: Cardiovascular;  Laterality: N/A;  RCA   ESOPHAGOGASTRODUODENOSCOPY (EGD) WITH PROPOFOL N/A 08/01/2022   Procedure: ESOPHAGOGASTRODUODENOSCOPY (EGD) WITH PROPOFOL;  Surgeon: Jerene Bears, MD;  Location: [place];  Service: Gastroenterology;  Laterality: N/A;   HEMOSTASIS CLIP PLACEMENT  08/01/2022   Procedure: HEMOSTASIS CLIP PLACEMENT;  Surgeon: Jerene Bears, MD;  Location: [facility] ENDOSCOPY;  Service: Gastroenterology;;   LAPAROSCOPIC LYSIS OF ADHESIONS N/A 08/03/2022   Procedure: LAPAROSCOPIC LYSIS OF ADHESIONS;  Surgeon: Jovita Kussmaul, MD;  Location: [place];  Service: General;  Laterality: N/A;   LAPAROSCOPIC PARTIAL COLECTOMY N/A 08/03/2022   Procedure: OPEN PARTIAL COLECTOMY;  Surgeon: Jovita Kussmaul, MD;  Location: [place];  Service: General;  Laterality: N/A;   LEFT HEART CATH AND CORONARY ANGIOGRAPHY N/A 06/18/2017   Procedure: LEFT HEART CATH AND CORONARY ANGIOGRAPHY;  Surgeon: Jettie Booze, MD;  Location: [place] CV LAB;  Service: Cardiovascular;  Laterality: N/A;   LEFT HEART CATH AND CORONARY ANGIOGRAPHY N/A 04/12/2020   Procedure: LEFT HEART CATH AND CORONARY ANGIOGRAPHY;  Surgeon: Martinique, Peter M, MD;  Location: [place] CV LAB;  Service: Cardiovascular;  Laterality: N/A;   POLYPECTOMY  08/01/2022   Procedure: POLYPECTOMY;  Surgeon: Jerene Bears, MD;  Location: [place];  Service: Gastroenterology;;   SUBMUCOSAL TATTOO  INJECTION  08/01/2022   Procedure: SUBMUCOSAL TATTOO INJECTION;  Surgeon: Jerene Bears, MD;  Location: [place];  Service: Gastroenterology;;   TEE WITHOUT CARDIOVERSION N/A 10/14/2020   Procedure: TRANSESOPHAGEAL ECHOCARDIOGRAM (TEE);  Surgeon: [name], MD;  Location: [place];  Service: Cardiovascular;  Laterality: N/A;   ULTRASOUND GUIDANCE FOR VASCULAR ACCESS  06/18/2017   Procedure: Ultrasound Guidance For Vascular Access;  Surgeon: Jettie Booze, MD;  Location: [place] CV LAB;  Service: Cardiovascular;;  right radial, right femoral     Home Meds: Prior to Admission medications   Medication Sig Start Date End Date Taking? Authorizing Provider  acetaminophen (TYLENOL) 325 MG tablet Take 325-650 mg by mouth every 6 (six) hours as needed for mild pain or headache.    [provider]  Alcohol Swabs (B-D SINGLE USE SWABS REGULAR) PADS Use to check blood sugar 2 times a day 03/30/21   Tower, Wynelle Fanny, MD  apixaban (ELIQUIS) 5 MG TABS tablet Take 1 tablet (5 mg total) by mouth 2 (two) times daily. 11/22/21   Lelon Perla, MD  Blood Glucose Calibration (TRUE METRIX LEVEL 1) Low SOLN Use to check control on glucose meter 03/30/21   Tower, Wynelle Fanny, MD  Blood Glucose Monitoring Suppl (TRUE METRIX AIR GLUCOSE METER) w/Device KIT 1 kit by Other route 2 (two) times daily. Check blood sugar twice daily and as directed. Dx E11.65 03/30/21   Tower, Wynelle Fanny, MD  Coenzyme Q10 (CO Q 10 PO) Take 200 mg by mouth daily.    [provider]  Cyanocobalamin (VITAMIN B12) 1000 MCG TBCR Take 1,000 mcg by mouth daily. 04/12/20   Cheryln Manly, NP  DROPLET PEN NEEDLES 31G X 6 MM MISC USE ONE TIME DAILY  AT  BEDTIME  WITH  LANTUS 01/01/22   Tower, Wynelle Fanny, MD  fluticasone (FLONASE) 50 MCG/ACT nasal spray Place 1 spray into both nostrils daily as needed for allergies.     [provider]  furosemide (LASIX) 20 MG tablet Take 20 mg by mouth daily. 07/18/22    [provider]  glipiZIDE (GLUCOTROL) 10 MG tablet TAKE 1 TABLET TWICE DAILY WITH MEALS 08/16/22   Tower, Linden A, MD  insulin glargine (LANTUS SOLOSTAR) 100 UNIT/ML Solostar Pen Inject 50 Units into the skin at bedtime. Patient taking differently: Inject 51 Units into the skin at bedtime. 10/11/21   Tower, Wynelle Fanny, MD  isosorbide mononitrate (IMDUR) 60 MG 24 hr tablet TAKE 1 TABLET EVERY DAY Patient taking differently: Take 60 mg by mouth daily. 02/05/22   Lelon Perla, MD  metFORMIN (GLUCOPHAGE) 1000 MG tablet TAKE 1 TABLET TWICE DAILY WITH MEALS Patient taking differently: Take 1,000 mg by mouth 2 (  two) times daily. 10/25/21   Tower, Wynelle Fanny, MD  metoprolol tartrate (LOPRESSOR) 25 MG tablet Take 0.5 tablets (12.5 mg total) by mouth 2 (two) times daily. 12/12/21   Lelon Perla, MD  nitroGLYCERIN (NITROSTAT) 0.4 MG SL tablet Place 1 tablet (0.4 mg total) under the tongue every 5 (five) minutes as needed for chest pain. 05/11/19   Duke, Tami Lin, PA  NOVOLOG FLEXPEN 100 UNIT/ML FlexPen Inject 16 Units into the skin daily. Before dinner 09/14/21   [provider]  pantoprazole (PROTONIX) 40 MG tablet Take 1 tablet (40 mg total) by mouth daily at 6 (six) AM. 08/12/22 10/11/22  Elodia Florence., MD  rosuvastatin (CRESTOR) 40 MG tablet Take 1 tablet (40 mg total) by mouth daily. 01/11/22 07/31/22  Lelon Perla, MD  TRUE METRIX BLOOD GLUCOSE TEST test strip CHECK BLOOD SUGAR TWICE DAILY 01/26/22   Tower, Wynelle Fanny, MD  TRUEplus Lancets 30G MISC Check blood sugar twice daily and as directed. Dx E11.65 03/30/21   Abner Greenspan, MD    Inpatient Medications: Scheduled Meds:  aspirin  81 mg Oral Daily   pantoprazole (PROTONIX) IV  40 mg Intravenous Q12H   rosuvastatin  40 mg Oral Daily   sodium chloride flush  3 mL Intravenous Q12H   ticagrelor  90 mg Oral BID   Continuous Infusions:  sodium chloride     PRN Meds: sodium chloride, acetaminophen, nitroGLYCERIN,  ondansetron (ZOFRAN) IV, sodium chloride flush  Allergies:   Allergies  Allergen Reactions   Loratadine Other (See Comments)    Not effective   Simvastatin Other (See Comments)    Intolerant- caused joint pain    Social History:   Social History   Socioeconomic History   Marital status: Married    Spouse name: Darrius Inouye   Number of children: 3   Years of education: Not on file   Highest education level: Not on file  Occupational History   Not on file  Tobacco Use   Smoking status: Never    Passive exposure: Never   Smokeless tobacco: Never  Vaping Use   Vaping Use: Never used  Substance and Sexual Activity   Alcohol use: No    Alcohol/week: 0.0 standard drinks of alcohol   Drug use: No   Sexual activity: Not Currently  Other Topics Concern   Not on file  Social History Narrative   Married    Physically active hard of hearing   Social Determinants of Health   Financial Resource Strain: Low Risk  (06/29/2021)   Overall Financial Resource Strain (CARDIA)    Difficulty of Paying Living Expenses: Not hard at all  Food Insecurity: No Food Insecurity (08/15/2022)   Hunger Vital Sign    Worried About Running Out of Food in the Last Year: Never true    Ran Out of Food in the Last Year: Never true  Transportation Needs: No Transportation Needs (08/15/2022)   PRAPARE - Hydrologist (Medical): No    Lack of Transportation (Non-Medical): No  Physical Activity: Inactive (06/29/2021)   Exercise Vital Sign    Days of Exercise per Week: 0 days    Minutes of Exercise per Session: 0 min  Stress: No Stress Concern Present (06/29/2021)   [place]    Feeling of Stress : Not at all  Social Connections: Moderately Integrated (06/29/2021)   Social Connection and Isolation Panel [NHANES]  Frequency of Communication with Friends and Family: More than three times a week    Frequency of  Social Gatherings with Friends and Family: More than three times a week    Attends Religious Services: More than 4 times per year    Active Member of [employer] or Organizations: No    Attends Archivist Meetings: Never    Marital Status: Married  Human resources officer Violence: Not At Risk (07/31/2022)   Humiliation, Afraid, Rape, and Kick questionnaire    Fear of Current or Ex-Partner: No    Emotionally Abused: No    Physically Abused: No    Sexually Abused: No     Family History:   Family History  Problem Relation Age of Onset   Heart attack Brother    Colon cancer Neg Hx    Colon polyps Neg Hx    Stomach cancer Neg Hx    Esophageal cancer Neg Hx     ROS:  Review of Systems  Constitutional:  Positive for malaise/fatigue. Negative for chills, diaphoresis, fever and weight loss.  HENT:  Negative for congestion.   Eyes:  Negative for discharge and redness.  Respiratory:  Negative for cough, sputum production, shortness of breath and wheezing.   Cardiovascular:  Negative for chest pain, palpitations, orthopnea, claudication, leg swelling and PND.  Gastrointestinal:  Negative for abdominal pain, heartburn, nausea and vomiting.  Musculoskeletal:  Negative for falls and myalgias.  Skin:  Negative for rash.  Neurological:  Negative for dizziness, tingling, tremors, sensory change, speech change, focal weakness, loss of consciousness and weakness.  Endo/Heme/Allergies:  Does not bruise/bleed easily.  Psychiatric/Behavioral:  Negative for substance abuse. The patient is not nervous/anxious.   All other systems reviewed and are negative.     Physical Exam/Data:   Vitals:   08/18/22 0400 08/18/22 0500 08/18/22 0700 08/18/22 0729  BP: (!) 108/49 127/64 (!) 123/47   Pulse: 73 (!) 162 (!) 51   Resp: 13 19 10   $ Temp:    (!) 97.2 F (36.2 C)  TempSrc:    Oral  SpO2: 99% 97% 100%   Weight:      Height:        Intake/Output Summary (Last 24 hours) at 08/18/2022 0825 Last data  filed at 08/18/2022 0700 Gross per 24 hour  Intake 85.79 ml  Output 950 ml  Net -864.21 ml   Filed Weights   08/17/22 1852  Weight: 106.7 kg   Body mass index is 32.81 kg/m.   Physical Exam: General: Well developed, well nourished, in no acute distress. Head: Normocephalic, atraumatic, sclera non-icteric, no xanthomas, nares without discharge.  Neck: Negative for carotid bruits. JVD not elevated. Lungs: Clear bilaterally to auscultation without wheezes, rales, or rhonchi. Breathing is unlabored. Heart: Irregular with S1 S2. No murmurs, rubs, or gallops appreciated. Right femoral arteriotomy site is without active bleeding, bruising, swelling, warmth, erythema, or TTP. No bruit.  Abdomen: Soft, non-tender, non-distended with normoactive bowel sounds. No hepatomegaly. No rebound/guarding. No obvious abdominal masses. Msk:  Strength and tone appear normal for age. Extremities: No clubbing or cyanosis. No edema. Distal pedal pulses are 2+ and equal bilaterally. Neuro: Alert and oriented X 3. No facial asymmetry. No focal deficit. Moves all extremities spontaneously. Psych:  Responds to questions appropriately with a normal affect.   EKG:  The EKG was personally reviewed and demonstrates: None available for review in Epic or paper chart Telemetry:  Telemetry was personally reviewed and demonstrates: SR with PACs and rare PVC  Weights: Filed Weights   08/17/22 1852  Weight: 106.7 kg    Relevant CV Studies:  LHC 08/17/2022: Left coronary system large left main mild irregularities LAD diffuse moderate to high-grade stenosis TIMI-3 flow Circumflex large diffuse moderate to high-grade stenosis TIMI-3 flow RCA large totally occluded mid to distal TIMI 0 flow   Intervention Successful PCI and stent mid to distal RCA 1 drug-eluting stent 3 oh x 34 mm frontier Onyx deployed at 14 atm Postdilated with a 3.0x 20 [place] Euphora up to 25 atm   Lesion reduced from 100 down to 0% TIMI-3 flow  restored from TIMI 0 Recommendations aspirin Brilinta for 12 months   Transferred to ICU Anticipate discharge within  48 hours if patient remains stable   Hospitalist consulted for medical management Patient will be transferred to cardiology management in the morning to [facility]  Laboratory Data:  Chemistry Recent Labs  Lab 08/17/22 1502 08/18/22 0543  NA 142 140  K 4.5 4.1  CL 106 110  CO2 20 24  GLUCOSE 210* 184*  BUN 14 15  CREATININE 1.31* 1.12  CALCIUM 8.3* 8.3*  GFRNONAA  --  >60  ANIONGAP  --  6    Recent Labs  Lab 08/17/22 1502  PROT 6.3  AST 101*  ALT 112*  BILITOT 0.3   Hematology Recent Labs  Lab 08/17/22 1502 08/18/22 0543  WBC 8.2 8.5  RBC 3.99* 3.51*  HGB 10.1* 8.7*  HCT 32.9* 29.5*  MCV 82.5 84.0  MCH 25.3* 24.8*  MCHC 30.7* 29.5*  RDW 17.4* 19.1*  PLT 442* 342   Cardiac EnzymesNo results for input(s): "TROPONINI" in the last 168 hours. No results for input(s): "TROPIPOC" in the last 168 hours.  BNPNo results for input(s): "BNP", "PROBNP" in the last 168 hours.  DDimer No results for input(s): "DDIMER" in the last 168 hours.  Radiology/Studies:  No results found.  Assessment and Plan:   1. CAD involving the native coronary arteries with inferior STEMI: -No further angina, no evidence of cardiac decompensation -Status post PCI/DES to the RCA 08/17/2202 -Will discuss transitioning Briltina to Plavix with MD given need for Eliquis use with history of atrial flutter -Ideally, would like to avoid triple therapy, especially given his underlying anemia -Echo pending -PTA metoprolol and rosuvastatin -Add back Imdur in a stepwise fashion as BP allows -Cardiac rehab  2. Paroxysmal atrial flutter: -Maintaining sinus rhythm with frequent PACs -Resume PTA metoprolol -CHADS2VASc at least 6 (CHF, HTN, age x 2, DM, vascular disease) -Will discuss resumption of Eliquis with transition from Brilinta to Plavix with MD in an effort to minimize bleeding  risk  3. HFpEF: -Does not appear grossly volume up -Echo pending to assist with optimization of GDMT  4. HTN: -Blood pressure is well controlled -Resume PTA metoprolol  5. HLD: -LDL 38 in 07/2022 -Crestor 40 mg  6.  Iron deficiency anemia: -Appears to be consistent with values obtained dating back to late 07/2022 -Repeat Hgb/Hct this afternoon to ensure stability   7. Recently diagnosed colon cancer: -Status post surgical resection on 08/03/2022  8.  Right renal mass: -Concerning for RCC  -Follow-up as outpatient    For questions or updates, please contact [place] Please consult [email or web address] for contact info under Cardiology/STEMI.   Signed, Christell Faith, PA-C [place] Pager: [phone] 08/18/2022, 8:25 AM

## 2022-08-19 DIAGNOSIS — I2111 ST elevation (STEMI) myocardial infarction involving right coronary artery: Secondary | ICD-10-CM | POA: Diagnosis not present

## 2022-08-19 DIAGNOSIS — E038 Other specified hypothyroidism: Secondary | ICD-10-CM

## 2022-08-19 DIAGNOSIS — I4892 Unspecified atrial flutter: Secondary | ICD-10-CM

## 2022-08-19 DIAGNOSIS — C182 Malignant neoplasm of ascending colon: Secondary | ICD-10-CM | POA: Diagnosis not present

## 2022-08-19 LAB — THYROID PANEL WITH TSH
Free Thyroxine Index: 2.3 (ref 1.2–4.9)
T3 Uptake Ratio: 48 % — ABNORMAL HIGH (ref 24–39)
T4, Total: 4.8 ug/dL (ref 4.5–12.0)
TSH: 7.73 u[IU]/mL — ABNORMAL HIGH (ref 0.450–4.500)

## 2022-08-19 LAB — GLUCOSE, CAPILLARY: Glucose-Capillary: 197 mg/dL — ABNORMAL HIGH (ref 70–99)

## 2022-08-19 LAB — CARDIAC CATHETERIZATION: Cath EF Quantitative: 55 %

## 2022-08-19 MED ORDER — CLOPIDOGREL BISULFATE 75 MG PO TABS: 75.0000 mg | ORAL_TABLET | Freq: Every day | ORAL | 0 refills | Status: DC

## 2022-08-19 MED ORDER — ISOSORBIDE MONONITRATE ER 60 MG PO TB24
60.0000 mg | ORAL_TABLET | Freq: Every day | ORAL | Status: DC
  Administered 2022-08-19: 60 mg via ORAL
  Filled 2022-08-19: qty 1

## 2022-08-19 MED ORDER — CLOPIDOGREL BISULFATE 75 MG PO TABS
300.0000 mg | ORAL_TABLET | Freq: Once | ORAL | Status: AC
  Administered 2022-08-19: 300 mg via ORAL
  Filled 2022-08-19: qty 4

## 2022-08-19 MED ORDER — CLOPIDOGREL BISULFATE 75 MG PO TABS: 75.0000 mg | ORAL_TABLET | Freq: Every day | ORAL | Status: DC

## 2022-08-19 NOTE — Assessment & Plan Note (Signed)
During hosp for anemia /colon cancer Improved after fluids now  Bmet today

## 2022-08-19 NOTE — Progress Notes (Signed)
Progress Note  Patient Name: Ryan Garcia Date of Encounter: 08/19/2022  Primary Cardiologist: Stanford Breed  Subjective   Transferred out of the ICU yesterday. Echo showed preserved LVSF with normal wall motion. Hgb stable at 8.9. Vitals stable. No chest pain, dyspnea, palpitations, dizziness, presyncope, or syncope. No right femoral cardiac cath site complications.   Inpatient Medications    Scheduled Meds:  apixaban  5 mg Oral BID   clopidogrel  300 mg Oral Once   [START ON 08/20/2022] clopidogrel  75 mg Oral Daily   insulin aspart  0-5 Units Subcutaneous QHS   insulin aspart  0-9 Units Subcutaneous TID WC   metoprolol tartrate  12.5 mg Oral BID   pantoprazole (PROTONIX) IV  40 mg Intravenous Q12H   rosuvastatin  40 mg Oral Daily   sodium chloride flush  3 mL Intravenous Q12H   Continuous Infusions:  sodium chloride     PRN Meds: sodium chloride, acetaminophen, nitroGLYCERIN, ondansetron (ZOFRAN) IV, sodium chloride flush   Vital Signs    Vitals:   08/18/22 2003 08/18/22 2041 08/18/22 2337 08/19/22 0451  BP: (!) 142/63  120/73 129/68  Pulse: 79 85 88 (!) 57  Resp: 19  18 18  $ Temp: 98.9 F (37.2 C)  97.8 F (36.6 C) 98.6 F (37 C)  TempSrc: Oral  Oral Oral  SpO2: 100%  96% 97%  Weight:      Height:        Intake/Output Summary (Last 24 hours) at 08/19/2022 0759 Last data filed at 08/19/2022 0452 Gross per 24 hour  Intake --  Output 650 ml  Net -650 ml   Filed Weights   08/17/22 1852  Weight: 106.7 kg    Telemetry    SR with PACs with a first degree AV block (consistent with prior EKGs) - Personally Reviewed  ECG    No new tracings - Personally Reviewed  Physical Exam   GEN: No acute distress.   Neck: No JVD. Cardiac: Irregular, no murmurs, rubs, or gallops. Right femoral arteriotomy site is without active bleeding, bruising, swelling, warmth, erythema, or TTP. No bruit.  Respiratory: Clear to auscultation bilaterally.  GI: Soft, nontender,  non-distended.   MS: No edema; No deformity. Neuro:  Alert and oriented x 3; Nonfocal.  Psych: Normal affect.  Labs    Chemistry Recent Labs  Lab 08/17/22 1502 08/18/22 0543  NA 142 140  K 4.5 4.1  CL 106 110  CO2 20 24  GLUCOSE 210* 184*  BUN 14 15  CREATININE 1.31* 1.12  CALCIUM 8.3* 8.3*  PROT 6.3  --   AST 101*  --   ALT 112*  --   BILITOT 0.3  --   GFRNONAA  --  >60  ANIONGAP  --  6     Hematology Recent Labs  Lab 08/17/22 1502 08/18/22 0543 08/18/22 1452  WBC 8.2 8.5  --   RBC 3.99* 3.51*  --   HGB 10.1* 8.7* 8.9*  HCT 32.9* 29.5* 30.7*  MCV 82.5 84.0  --   MCH 25.3* 24.8*  --   MCHC 30.7* 29.5*  --   RDW 17.4* 19.1*  --   PLT 442* 342  --     Cardiac EnzymesNo results for input(s): "TROPONINI" in the last 168 hours. No results for input(s): "TROPIPOC" in the last 168 hours.   BNPNo results for input(s): "BNP", "PROBNP" in the last 168 hours.   DDimer No results for input(s): "DDIMER" in the last 168 hours.  Radiology     Cardiac Studies   LHC 08/17/2022: Left coronary system large left main mild irregularities LAD diffuse moderate to high-grade stenosis TIMI-3 flow Circumflex large diffuse moderate to high-grade stenosis TIMI-3 flow RCA large totally occluded mid to distal TIMI 0 flow   Intervention Successful PCI and stent mid to distal RCA 1 drug-eluting stent 3 oh x 34 mm frontier Onyx deployed at 14 atm Postdilated with a 3.0x 20 [place] Euphora up to 25 atm   Lesion reduced from 100 down to 0% TIMI-3 flow restored from TIMI 0 Recommendations aspirin Brilinta for 12 months   Transferred to ICU Anticipate discharge within  48 hours if patient remains stable   Hospitalist consulted for medical management Patient will be transferred to cardiology management in the morning to [facility] __________  2D echo 08/18/2022: 1. Left ventricular ejection fraction, by estimation, is 60 to 65%. The  left ventricle has normal function. The left ventricle  has no regional  wall motion abnormalities. There is mild left ventricular hypertrophy.  Left ventricular diastolic parameters  were normal.   2. Right ventricular systolic function is normal. The right ventricular  size is normal. There is normal pulmonary artery systolic pressure.   3. The mitral valve is normal in structure. Trivial mitral valve  regurgitation.   4. The aortic valve is tricuspid. Aortic valve regurgitation is not  visualized.   5. Aortic dilatation noted. There is mild dilatation of the ascending  aorta, measuring 39 mm.   6. The inferior vena cava is normal in size with greater than 50%  respiratory variability, suggesting right atrial pressure of 3 mmHg.   Patient Profile     89 y.o. male with history of multivessel CAD as outlined below, paroxysmal atrial flutter on apixaban status post TEE guided DCCV in 09/2020, recently diagnosed adenocarcinoma of the colon status post surgical resection on 08/03/2022, iron deficiency anemia, HFpEF, right renal mass concerning for renal cell carcinoma, HTN, HLD, and OSA on CPAP who is being seen today for the evaluation of inferior STEMI at the request of Dr. Posey Pronto.   Assessment & Plan    1. CAD involving the native coronary arteries with inferior STEMI: -No further angina, no evidence of cardiac decompensation -Status post PCI/DES to the RCA 08/17/2202 -Transition from Brilinta to Plavix this morning (with loading dose of 300 mg) given need for [facility] with known atrial flutter -Stop ASA given Eliquis use in an effort to avoid triple therapy (known anemia following GI cancer diagnosis s/p surgical resection earlier this month) -Echo with preserved LVSF and normal wall motion -PTA metoprolol and rosuvastatin -Add back PTA Imdur  -Post cath instructions  -Cardiac rehab   2. Paroxysmal atrial flutter: -Maintaining sinus rhythm with frequent PACs (consistent with prior EKGs) -PTA metoprolol -CHADS2VASc at least 6 (CHF, HTN, age x 2,  DM, vascular disease) -Now back on PTA Eliquis (does not meet reduced dosing criteria)   3. HFpEF: -Does not appear grossly volume up -Echo pending to assist with optimization of GDMT   4. HTN: -Blood pressure is well controlled -PTA Lopressor -Add PTA Imdur   5. HLD: -LDL 38 in 07/2022 -Crestor 40 mg   6.  Iron deficiency anemia: -Appears to be consistent with values obtained dating back to late 07/2022 -Stable -Will need close monitoring   7. Recently diagnosed colon cancer: -Status post surgical resection on 08/03/2022   8.  Right renal mass: -Concerning for RCC  -Follow-up as outpatient    For  questions or updates, please contact [place] Please consult [email or web address] for contact info under Cardiology/STEMI.    Signed, Christell Faith, PA-C [facility] Pager: [phone] 08/19/2022, 7:59 AM

## 2022-08-19 NOTE — Discharge Summary (Signed)
Physician Discharge Summary   Patient: Ryan Garcia MRN: [id number] DOB: [birth year]  Admit date:     08/17/2022  Discharge date: 08/19/22  Discharge Physician: Max Sane   PCP: Abner Greenspan, MD   Recommendations at discharge:   Follow-up with outpatient providers as requested  Discharge Diagnoses: Principal Problem:   STEMI involving right coronary artery ([place]) Active Problems:   BPH (benign prostatic hyperplasia)   GERD (gastroesophageal reflux disease)   Subclinical hypothyroidism   AKI (acute kidney injury) ([place])   Right renal mass   Anemia   Ascending colon malignant neoplasm (HCC)   Electrolyte abnormality   Paroxysmal atrial flutter [facility])  Hospital Course: 89 y.o. male with a hx of multivessel CAD as outlined below, paroxysmal atrial flutter on apixaban status post TEE guided DCCV in 09/2020, recently diagnosed adenocarcinoma of the colon status post surgical resection on 08/03/2022, iron deficiency anemia, HFpEF, right renal mass concerning for renal cell carcinoma, HTN, HLD, and OSA on CPAP was admitted for inferior STEMI   2/16: Emergent cardiac cath status post successful PCI and stenting of RCA  2/17: Transfer to PCU  Assessment and Plan: * STEMI involving right coronary artery ([place]) Status post successful PCI in stenting to distal RCA Cardiology recommended Plavix, Eliquis, Crestor, Lopressor Awaiting long-term triple therapy due to anemia Cardiac rehab at discharge Cardiology follow-up as an outpatient  Paroxysmal atrial fibrillation/flutter ([place]) Currently in sinus rhythm Continue metoprolol  Electrolyte abnormality Replete and resolved  Ascending colon malignant neoplasm ([place]) S/P removal and stitches removed  S/p surgical resection on 08/03/2022 followed by oncology as an outpatient  Anemia Likely of chronic disease due to underlying cancer.  He also has right renal mass Outpatient oncology follow-up.    Right renal mass Concerning for cancer.   Outpatient follow-up with oncology  AKI (acute kidney injury) ([place]) Prerenal, resolved with hydration  Subclinical hypothyroidism GERD (gastroesophageal reflux disease) BPH (benign prostatic hyperplasia)        Consultants: Cardiology Procedures performed: Cardiac catheterization on 2/16 Disposition: Home Diet recommendation:  Discharge Diet Orders (From admission, onward)     Start     Ordered   08/19/22 0000  Diet - low sodium heart healthy        08/19/22 1114           Carb modified diet DISCHARGE MEDICATION: Allergies as of 08/19/2022       Reactions   Loratadine Other (See Comments)   Not effective   Simvastatin Other (See Comments)   Intolerant- caused joint pain        Medication List     TAKE these medications    acetaminophen 325 MG tablet Commonly known as: TYLENOL Take 325-650 mg by mouth every 6 (six) hours as needed for mild pain or headache.   apixaban 5 MG Tabs tablet Commonly known as: ELIQUIS Take 1 tablet (5 mg total) by mouth 2 (two) times daily.   B-D SINGLE USE SWABS REGULAR Pads Use to check blood sugar 2 times a day   clopidogrel 75 MG tablet Commonly known as: PLAVIX Take 1 tablet (75 mg total) by mouth daily. Start taking on: August 20, 2022   CO Q 10 PO Take 200 mg by mouth daily.   Droplet Pen Needles 31G X 6 MM Misc Generic drug: Insulin Pen Needle USE ONE TIME DAILY  AT  BEDTIME  WITH  LANTUS   Flonase 50 MCG/ACT nasal spray Generic drug: fluticasone Place 1 spray into both nostrils daily  as needed for allergies.   furosemide 20 MG tablet Commonly known as: LASIX Take 20 mg by mouth daily.   glipiZIDE 10 MG tablet Commonly known as: GLUCOTROL TAKE 1 TABLET TWICE DAILY WITH MEALS   isosorbide mononitrate 60 MG 24 hr tablet Commonly known as: IMDUR TAKE 1 TABLET EVERY DAY   Lantus SoloStar 100 UNIT/ML Solostar Pen Generic drug: insulin glargine Inject 50 Units into the skin at bedtime. What changed:  how much to take   metFORMIN 1000 MG tablet Commonly known as: GLUCOPHAGE TAKE 1 TABLET TWICE DAILY WITH MEALS What changed: when to take this   metoprolol tartrate 25 MG tablet Commonly known as: LOPRESSOR Take 0.5 tablets (12.5 mg total) by mouth 2 (two) times daily.   nitroGLYCERIN 0.4 MG SL tablet Commonly known as: Nitrostat Place 1 tablet (0.4 mg total) under the tongue every 5 (five) minutes as needed for chest pain.   NovoLOG FlexPen 100 UNIT/ML FlexPen Generic drug: insulin aspart Inject 16 Units into the skin daily. Before dinner   pantoprazole 40 MG tablet Commonly known as: PROTONIX Take 1 tablet (40 mg total) by mouth daily at 6 (six) AM.   rosuvastatin 40 MG tablet Commonly known as: CRESTOR Take 1 tablet (40 mg total) by mouth daily.   True Metrix Air Glucose Meter w/Device Kit 1 kit by Other route 2 (two) times daily. Check blood sugar twice daily and as directed. Dx E11.65   True Metrix Blood Glucose Test test strip Generic drug: glucose blood CHECK BLOOD SUGAR TWICE DAILY   True Metrix Level 1 Low Soln Use to check control on glucose meter   TRUEplus Lancets 30G Misc Check blood sugar twice daily and as directed. Dx E11.65   Vitamin B12 1000 MCG Tbcr Take 1,000 mcg by mouth daily.        Follow-up Information     Tower, Wynelle Fanny, MD. Schedule an appointment as soon as possible for a visit in 3 day(s).   Specialties: Family Medicine, Radiology Why: [facility] Discharge F/UP Contact information: [place] [address] [phone]         Kate Sable, MD. Schedule an appointment as soon as possible for a visit in 2 week(s).   Specialties: Cardiology, Radiology Why: [facility] Discharge F/UP Contact information: [place] [address] [phone]                Discharge Exam: Danley Danker Weights   08/17/22 1852  Weight: 106.70 kg   89 year old male lying in the bed  comfortably without any acute distress Lungs clear to auscultation bilaterally Cardiovascular irregularly irregular heart sounds, no murmur rubs or gallop Abdomen: Vertical abdominal incision appears to be healing and has Steri-Strips and dressing in place.  Benign, soft Neuro alert and awake, nonfocal Psych no mood and affect  Condition at discharge: good  The results of significant diagnostics from this hospitalization (including imaging, microbiology, ancillary and laboratory) are listed below for reference.   Imaging Studies: CARDIAC CATHETERIZATION  Result Date: 08/19/2022   Prox LAD lesion is 85% stenosed.   Dist LAD lesion is 65% stenosed.   Prox LAD to Mid LAD lesion is 75% stenosed.   Ost Cx to Mid Cx lesion is 80% stenosed.   Mid Cx to Dist Cx lesion is 65% stenosed.   Prox RCA to Mid RCA lesion is 30% stenosed.   Ost 1st Diag to 1st Diag lesion is 85% stenosed.   Ost 1st  Mrg to 1st Mrg lesion is 95% stenosed.   2nd Mrg lesion is 75% stenosed.   RPDA lesion is 60% stenosed.   1st RPL lesion is 70% stenosed.   Mid RCA to Dist RCA lesion is 100% stenosed.   Non-stenotic Dist RCA lesion was previously treated.   A drug-eluting stent was successfully placed using a STENT ONYX FRONTIER [id number].   Post intervention, there is a 0% residual stenosis.   The left ventricular systolic function is normal.   LV end diastolic pressure is mildly elevated.   There is mild (2+) mitral regurgitation. STEMI presentation inferior MI Recent abdominal surgery for colon cancer as well as underlying atrial fibrillation on Eliquis Patient presented with acute onset of midsternal chest pain initial EKG by EMS suggested inferior STEMI Patient brought to the emergency room hemodynamically stable given heparin aspirin Brilinta in the emergency room Brought to the cardiac Cath Lab Right groin approach Left ventriculogram showed preserved left ventricular function mild inferior hypo-EF around 50 to 55% mild left ventricular  enlargement Coronaries Left main large with minor irregularities LAD large with diffuse moderate to severe disease which is known but appeared to be stable TIMI-3 flow Circumflex large with moderate to severe diffuse disease as well TIMI-3 flow previously known RCA large with the distal 100% occlusion IRA TIMI 0 flow lesion No significant collaterals Dimension Successful PCI and stent of distal RCA with a 3.0 x 34 mm DES stent Postdilated with a 3.0 x 20 mm [place] balloon up to 16 atm TIMI-3 flow was restored from TIMI 0 Lesion reduced from 100% down to 0 Patient tolerated procedure well Mynx deployed to the right femoral artery No complications Patient then transferred to ICU for recovery Cardiology management will be transferred back to [facility] in the morning he is a patient of Dr. Stanford Breed  ECHOCARDIOGRAM COMPLETE  Result Date: 08/18/2022    ECHOCARDIOGRAM REPORT   Patient Name:   CHARLE Garcia Date of Exam: 08/18/2022 Medical Rec #:  [id number]     Height:       71.0 in Accession #:    [id number]    Weight:       235.2 lb Date of Birth:  1934-04-26     BSA:          2.259 m Patient Age:    89 years      BP:           110/58 mmHg Patient Gender: M             HR:           58 bpm. Exam Location:  [facility] Procedure: 2D Echo, Color Doppler and Cardiac Doppler Indications:     Acute Myocardial Infarction I21.9  History:         Patient has prior history of Echocardiogram examinations. CAD                  and Previous Myocardial Infarction, Arrythmias:PAFlutter; Risk                  Factors:Diabetes and Dyslipidemia.  Sonographer:     L. Thornton-Maynard Referring Phys:  [id number] Karma Greaser D CALLWOOD Diagnosing Phys: Kate Sable MD IMPRESSIONS  1. Left ventricular ejection fraction, by estimation, is 60 to 65%. The left ventricle has normal function. The left ventricle has no regional wall motion abnormalities. There is mild left ventricular hypertrophy. Left ventricular diastolic parameters were normal.  2. Right  ventricular systolic function is normal.  The right ventricular size is normal. There is normal pulmonary artery systolic pressure.  3. The mitral valve is normal in structure. Trivial mitral valve regurgitation.  4. The aortic valve is tricuspid. Aortic valve regurgitation is not visualized.  5. Aortic dilatation noted. There is mild dilatation of the ascending aorta, measuring 39 mm.  6. The inferior vena cava is normal in size with greater than 50% respiratory variability, suggesting right atrial pressure of 3 mmHg. FINDINGS  Left Ventricle: Left ventricular ejection fraction, by estimation, is 60 to 65%. The left ventricle has normal function. The left ventricle has no regional wall motion abnormalities. The left ventricular internal cavity size was normal in size. There is  mild left ventricular hypertrophy. Left ventricular diastolic parameters were normal. Right Ventricle: The right ventricular size is normal. No increase in right ventricular wall thickness. Right ventricular systolic function is normal. There is normal pulmonary artery systolic pressure. The tricuspid regurgitant velocity is 2.58 m/s, and  with an assumed right atrial pressure of 3 mmHg, the estimated right ventricular systolic pressure is 0000000 mmHg. Left Atrium: Left atrial size was normal in size. Right Atrium: Right atrial size was normal in size. Pericardium: There is no evidence of pericardial effusion. Mitral Valve: The mitral valve is normal in structure. Trivial mitral valve regurgitation. MV peak gradient, 4.5 mmHg. The mean mitral valve gradient is 3.0 mmHg. Tricuspid Valve: The tricuspid valve is normal in structure. Tricuspid valve regurgitation is not demonstrated. Aortic Valve: The aortic valve is tricuspid. Aortic valve regurgitation is not visualized. Aortic valve mean gradient measures 6.0 mmHg. Aortic valve peak gradient measures 9.7 mmHg. Aortic valve area, by VTI measures 2.92 cm. Pulmonic Valve: The pulmonic valve was  not well visualized. Pulmonic valve regurgitation is not visualized. Aorta: The aortic root is normal in size and structure and aortic dilatation noted. There is mild dilatation of the ascending aorta, measuring 39 mm. Venous: The inferior vena cava is normal in size with greater than 50% respiratory variability, suggesting right atrial pressure of 3 mmHg. IAS/Shunts: No atrial level shunt detected by color flow Doppler.  LEFT VENTRICLE PLAX 2D LVIDd:         4.60 cm     Diastology LVIDs:         3.20 cm     LV e' medial:    7.45 cm/s LV PW:         1.00 cm     LV E/e' medial:  13.0 LV IVS:        1.30 cm     LV e' lateral:   9.29 cm/s LVOT diam:     2.00 cm     LV E/e' lateral: 10.4 LV SV:         90 LV SV Index:   40 LVOT Area:     3.14 cm  LV Volumes (MOD) LV vol d, MOD A2C: 57.2 ml LV vol d, MOD A4C: 83.4 ml LV vol s, MOD A2C: 23.3 ml LV vol s, MOD A4C: 31.3 ml LV SV MOD A2C:     33.9 ml LV SV MOD A4C:     83.4 ml LV SV MOD BP:      44.7 ml RIGHT VENTRICLE RV S prime:     15.50 cm/s TAPSE (M-mode): 2.6 cm LEFT ATRIUM             Index        RIGHT ATRIUM  Index LA diam:        4.80 cm 2.12 cm/m   RA Area:     18.40 cm LA Vol (A2C):   61.8 ml 27.36 ml/m  RA Volume:   42.80 ml  18.95 ml/m LA Vol (A4C):   64.7 ml 28.64 ml/m LA Biplane Vol: 63.5 ml 28.11 ml/m  AORTIC VALVE                     PULMONIC VALVE AV Area (Vmax):    2.63 cm      PV Vmax:       1.07 m/s AV Area (Vmean):   2.34 cm      PV Peak grad:  4.6 mmHg AV Area (VTI):     2.92 cm AV Vmax:           155.50 cm/s AV Vmean:          110.500 cm/s AV VTI:            0.310 m AV Peak Grad:      9.7 mmHg AV Mean Grad:      6.0 mmHg LVOT Vmax:         130.00 cm/s LVOT Vmean:        82.300 cm/s LVOT VTI:          0.288 m LVOT/AV VTI ratio: 0.93  AORTA Ao Root diam: 3.50 cm Ao Asc diam:  3.90 cm MITRAL VALVE               TRICUSPID VALVE MV Area (PHT): 2.82 cm    TR Peak grad:   26.6 mmHg MV Area VTI:   2.17 cm    TR Vmax:        258.00 cm/s  MV Peak grad:  4.5 mmHg MV Mean grad:  3.0 mmHg    SHUNTS MV Vmax:       1.06 m/s    Systemic VTI:  0.29 m MV Vmean:      81.9 cm/s   Systemic Diam: 2.00 cm MV Decel Time: 269 msec MV E velocity: 96.90 cm/s MV A velocity: 90.70 cm/s MV E/A ratio:  1.07 Kate Sable MD Electronically signed by Kate Sable MD Signature Date/Time: 08/18/2022/1:13:24 PM    Final    VAS [place] LOWER EXTREMITY VENOUS (DVT)  Result Date: 08/09/2022  Lower Venous DVT Study Patient Name:  Ryan Garcia  Date of Exam:   08/08/2022 Medical Rec #: [id number]      Accession #:    [id number] Date of Birth: 07-24-1933      Patient Gender: M Patient Age:   89 years Exam Location:  [facility] Procedure:      VAS [place] LOWER EXTREMITY VENOUS (DVT) Referring Phys: A POWELL JR --------------------------------------------------------------------------------  Indications: Swelling, and Edema. Other Indications: S/P partial colectomy. Performing Technologist: Oda Cogan RDMS, RVT  Examination Guidelines: A complete evaluation includes B-mode imaging, spectral Doppler, color Doppler, and power Doppler as needed of all accessible portions of each vessel. Bilateral testing is considered an integral part of a complete examination. Limited examinations for reoccurring indications may be performed as noted. The reflux portion of the exam is performed with the patient in reverse Trendelenburg.  +---------+---------------+---------+-----------+----------+--------------+ RIGHT    CompressibilityPhasicitySpontaneityPropertiesThrombus Aging +---------+---------------+---------+-----------+----------+--------------+ CFV      Full           Yes      Yes                                 +---------+---------------+---------+-----------+----------+--------------+  SFJ      Full                                                        +---------+---------------+---------+-----------+----------+--------------+ FV Prox  Full                                                         +---------+---------------+---------+-----------+----------+--------------+ FV Mid   Full                                                        +---------+---------------+---------+-----------+----------+--------------+ FV DistalFull                                                        +---------+---------------+---------+-----------+----------+--------------+ PFV      Full                                                        +---------+---------------+---------+-----------+----------+--------------+ POP      Full           Yes      Yes                                 +---------+---------------+---------+-----------+----------+--------------+ PTV      Full                                                        +---------+---------------+---------+-----------+----------+--------------+ PERO     Full                                                        +---------+---------------+---------+-----------+----------+--------------+   +---------+---------------+---------+-----------+----------+--------------+ LEFT     CompressibilityPhasicitySpontaneityPropertiesThrombus Aging +---------+---------------+---------+-----------+----------+--------------+ CFV      Full           Yes      Yes                                 +---------+---------------+---------+-----------+----------+--------------+ SFJ      Full                                                        +---------+---------------+---------+-----------+----------+--------------+  FV Prox  Full                                                        +---------+---------------+---------+-----------+----------+--------------+ FV Mid   Full                                                        +---------+---------------+---------+-----------+----------+--------------+ FV DistalFull                                                         +---------+---------------+---------+-----------+----------+--------------+ PFV      Full                                                        +---------+---------------+---------+-----------+----------+--------------+ POP      Full           Yes      Yes                                 +---------+---------------+---------+-----------+----------+--------------+ PTV      Full                                                        +---------+---------------+---------+-----------+----------+--------------+ PERO     Full                                                        +---------+---------------+---------+-----------+----------+--------------+     Summary: BILATERAL: - No evidence of deep vein thrombosis seen in the lower extremities, bilaterally. -No evidence of popliteal cyst, bilaterally.   *See table(s) above for measurements and observations. Electronically signed by Jamelle Haring on 08/09/2022 at 2:49:54 PM.    Final    CT ABDOMEN PELVIS W CONTRAST  Result Date: 08/08/2022 CLINICAL DATA:  Status post laparoscopic colectomy on February 2nd for colon cancer. Abdominal pain. * Tracking Code: BO * EXAM: CT ABDOMEN AND PELVIS WITH CONTRAST TECHNIQUE: Multidetector CT imaging of the abdomen and pelvis was performed using the standard protocol following bolus administration of intravenous contrast. RADIATION DOSE REDUCTION: This exam was performed according to the departmental dose-optimization program which includes automated exposure control, adjustment of the mA and/or kV according to patient size and/or use of iterative reconstruction technique. CONTRAST:  13m OMNIPAQUE IOHEXOL 350 MG/ML SOLN COMPARISON:  07/31/2022 chest abdomen and pelvic CTs. FINDINGS: Lower chest: Right greater than left base dependent compressive atelectasis. Right lower lobe calcified granuloma. Mild  cardiomegaly with multivessel coronary artery calcification. Tiny bilateral pleural effusions.  Hepatobiliary: Normal liver. Gallstones up to 6 mm without acute cholecystitis or biliary duct dilatation. Pancreas: Normal, without mass or ductal dilatation. Spleen: Normal in size, without focal abnormality. Adrenals/Urinary Tract: Normal adrenal glands. Bilateral renal cysts including up to 12.1 cm. Upper pole right renal 2.5 cm lesion has been evaluated previously on dedicated imaging (11/8 today). No hydronephrosis. Normal urinary bladder. Stomach/Bowel: Normal stomach, without wall thickening. Descending duodenal diverticulum is tiny. Otherwise normal small bowel. Scattered colonic diverticula. Status post partial right hemicolectomy. Vascular/Lymphatic: Aortic atherosclerosis. No abdominopelvic adenopathy. Reproductive: Mild prostatomegaly. Other: Trace fluid within the pelvic cul-de-sac is new. There is also ill-defined small volume right perihepatic fluid, just lateral to the surgical sutures. Example at 4.9 x 1.4 cm on 41/3. 6.2 cm craniocaudal on 58/6. No surrounding enhancement or gas within. No intra-abdominal/pelvic extraluminal gas. There is trace gas within the anterior abdominal wall which is presumably postoperative in the setting of midline laparotomy staples. Musculoskeletal: No acute osseous abnormality. IMPRESSION: 1. Status post partial right hemicolectomy, without acute postoperative complication. 2. Small volume right perihepatic fluid, just lateral to the surgical site. Likely an area of loculated ascites. No abnormal enhancement or gas within to suggest abscess. 3. Tiny bilateral pleural effusions and bibasilar atelectasis. Trace cul-de-sac fluid. 4. Cholelithiasis 5. Upper pole right renal lesion has been detailed on dedicated imaging as suspicious for renal cell carcinoma. 6. Coronary artery atherosclerosis. Aortic Atherosclerosis (ICD10-I70.0). Electronically Signed   By: Abigail Miyamoto M.D.   On: 08/08/2022 12:02   DG CHEST PORT 1 VIEW  Result Date: 08/08/2022 CLINICAL DATA:  Chest  pain. EXAM: PORTABLE CHEST 1 VIEW COMPARISON:  CT chest 07/31/2022 and chest radiograph 07/30/2022. FINDINGS: Trachea is midline. Heart is enlarged, stable. Lungs are somewhat low in volume with mild bibasilar interstitial prominence and indistinctness. Tiny bilateral pleural effusions. IMPRESSION: Tiny bilateral effusions with suspected mild pulmonary edema. Electronically Signed   By: Lorin Picket M.D.   On: 08/08/2022 09:11   ECHOCARDIOGRAM COMPLETE  Result Date: 07/31/2022    ECHOCARDIOGRAM REPORT   Patient Name:   Ryan Garcia Date of Exam: 07/31/2022 Medical Rec #:  [id number]     Height:       71.0 in Accession #:    [id number]    Weight:       257.5 lb Date of Birth:  1934/06/01     BSA:          2.348 m Patient Age:    56 years      BP:           151/68 mmHg Patient Gender: M             HR:           64 bpm. Exam Location:  Inpatient Procedure: 2D Echo, Cardiac Doppler and Color Doppler                       STAT ECHO Reported to: Dr Eleonore Chiquito on 07/31/2022 8:06:00 AM. Indications:    NSTEMI  History:        Patient has prior history of Echocardiogram examinations, most                 recent 10/14/2020. Previous Myocardial Infarction,                 Arrythmias:Atrial Fibrillation; Risk Factors:Diabetes,  Hypertension and Sleep Apnea.  Sonographer:    Clayton Lefort RDCS (AE) Referring Phys: [id number] [place]  1. Left ventricular ejection fraction, by estimation, is 60 to 65%. The left ventricle has normal function. The left ventricle has no regional wall motion abnormalities. There is mild concentric left ventricular hypertrophy. Left ventricular diastolic function could not be evaluated.  2. Right ventricular systolic function is normal. The right ventricular size is normal. There is mildly elevated pulmonary artery systolic pressure. The estimated right ventricular systolic pressure is 123XX123 mmHg.  3. The mitral valve is grossly normal. No evidence of mitral valve  regurgitation. No evidence of mitral stenosis.  4. The aortic valve is tricuspid. There is mild calcification of the aortic valve. There is mild thickening of the aortic valve. Aortic valve regurgitation is not visualized. Aortic valve sclerosis/calcification is present, without any evidence of aortic stenosis.  5. The inferior vena cava is normal in size with greater than 50% respiratory variability, suggesting right atrial pressure of 3 mmHg. Comparison(s): No significant change from prior study. FINDINGS  Left Ventricle: Left ventricular ejection fraction, by estimation, is 60 to 65%. The left ventricle has normal function. The left ventricle has no regional wall motion abnormalities. The left ventricular internal cavity size was normal in size. There is  mild concentric left ventricular hypertrophy. Left ventricular diastolic function could not be evaluated due to atrial fibrillation. Left ventricular diastolic function could not be evaluated. Right Ventricle: The right ventricular size is normal. No increase in right ventricular wall thickness. Right ventricular systolic function is normal. There is mildly elevated pulmonary artery systolic pressure. The tricuspid regurgitant velocity is 2.90  m/s, and with an assumed right atrial pressure of 3 mmHg, the estimated right ventricular systolic pressure is 123XX123 mmHg. Left Atrium: Left atrial size was normal in size. Right Atrium: Right atrial size was normal in size. Pericardium: There is no evidence of pericardial effusion. Mitral Valve: The mitral valve is grossly normal. No evidence of mitral valve regurgitation. No evidence of mitral valve stenosis. Tricuspid Valve: The tricuspid valve is grossly normal. Tricuspid valve regurgitation is mild . No evidence of tricuspid stenosis. Aortic Valve: The aortic valve is tricuspid. There is mild calcification of the aortic valve. There is mild thickening of the aortic valve. Aortic valve regurgitation is not visualized.  Aortic valve sclerosis/calcification is present, without any evidence of aortic stenosis. Aortic valve mean gradient measures 5.0 mmHg. Aortic valve peak gradient measures 9.7 mmHg. Aortic valve area, by VTI measures 2.75 cm. Pulmonic Valve: The pulmonic valve was grossly normal. Pulmonic valve regurgitation is not visualized. No evidence of pulmonic stenosis. Aorta: The aortic root and ascending aorta are structurally normal, with no evidence of dilitation. Venous: The inferior vena cava is normal in size with greater than 50% respiratory variability, suggesting right atrial pressure of 3 mmHg. IAS/Shunts: The atrial septum is grossly normal.  LEFT VENTRICLE PLAX 2D LVIDd:         4.80 cm LVIDs:         3.10 cm LV PW:         1.10 cm LV IVS:        1.20 cm LVOT diam:     2.10 cm LV SV:         93 LV SV Index:   40 LVOT Area:     3.46 cm  RIGHT VENTRICLE             IVC RV Basal diam:  3.20 cm     IVC diam: 1.40 cm RV S prime:     13.90 cm/s TAPSE (M-mode): 2.7 cm LEFT ATRIUM              Index        RIGHT ATRIUM           Index LA diam:        3.90 cm  1.66 cm/m   RA Area:     21.60 cm LA Vol (A2C):   104.0 ml 44.30 ml/m  RA Volume:   59.20 ml  25.22 ml/m LA Vol (A4C):   70.2 ml  29.90 ml/m LA Biplane Vol: 88.7 ml  37.78 ml/m  AORTIC VALVE AV Area (Vmax):    2.86 cm AV Area (Vmean):   2.80 cm AV Area (VTI):     2.75 cm AV Vmax:           156.00 cm/s AV Vmean:          105.000 cm/s AV VTI:            0.338 m AV Peak Grad:      9.7 mmHg AV Mean Grad:      5.0 mmHg LVOT Vmax:         129.00 cm/s LVOT Vmean:        84.900 cm/s LVOT VTI:          0.268 m LVOT/AV VTI ratio: 0.79  AORTA Ao Root diam: 3.40 cm Ao Asc diam:  3.60 cm TRICUSPID VALVE TR Peak grad:   33.6 mmHg TR Vmax:        290.00 cm/s  SHUNTS Systemic VTI:  0.27 m Systemic Diam: 2.10 cm Eleonore Chiquito MD Electronically signed by Eleonore Chiquito MD Signature Date/Time: 07/31/2022/8:20:37 AM    Final    CT CHEST ABDOMEN PELVIS W CONTRAST  Result  Date: 07/31/2022 CLINICAL DATA:  Chest pain, known right renal mass, evaluate for metastatic disease EXAM: CT CHEST, ABDOMEN, AND PELVIS WITH CONTRAST TECHNIQUE: Multidetector CT imaging of the chest, abdomen and pelvis was performed following the standard protocol during bolus administration of intravenous contrast. RADIATION DOSE REDUCTION: This exam was performed according to the departmental dose-optimization program which includes automated exposure control, adjustment of the mA and/or kV according to patient size and/or use of iterative reconstruction technique. CONTRAST:  51m OMNIPAQUE IOHEXOL 350 MG/ML SOLN COMPARISON:  MRI abdomen dated 08/14/2021. CTA chest dated 10/12/2020. FINDINGS: CT CHEST FINDINGS Cardiovascular: Heart is normal in size.  No pericardial effusion. No evidence of thoracic aortic aneurysm. Atherosclerotic calcifications of the aortic arch. Severe three-vessel coronary atherosclerosis. Mediastinum/Nodes: 10 mm short axis right azygoesophageal recess node, previously 12 mm. Additional calcified right perihilar nodes. No suspicious mediastinal lymphadenopathy. Visualized thyroid is unremarkable. Lungs/Pleura: No suspicious pulmonary nodules. Calcified granuloma in the right lower lobe, benign. Mild subpleural reticulation/scarring in the lungs bilaterally, lower lobe predominant. No pleural effusion or pneumothorax. Musculoskeletal: Moderate degenerative changes of the thoracic spine. No focal osseous lesions. CT ABDOMEN PELVIS FINDINGS Hepatobiliary: Liver is within normal limits. No suspicious/enhancing hepatic lesions. Layering small gallstones (series 3/image 66), without associated inflammatory changes. No intrahepatic or extrahepatic duct dilatation. Pancreas: Within normal limits. Spleen: Within normal limits. Adrenals/Urinary Tract: Adrenal glands are within normal limits. Multiple simple bilateral renal cysts, measuring up to 14.8 cm in the left upper kidney (series 3/image 68),  benign (Bosniak I). No follow-up is recommended. 2.5 x 2.6 cm enhancing lesion in the anterior right upper kidney (series 3/image  81), grossly unchanged from prior MRI, compatible with solid renal neoplasm such as renal cell carcinoma. No hydronephrosis. Bladder is underdistended but unremarkable. Stomach/Bowel: Stomach is notable for a tiny hiatal hernia. No evidence of bowel obstruction. Normal appendix (series 3/image 98). Sigmoid diverticulosis, without evidence of diverticulitis. Vascular/Lymphatic: No evidence of abdominal aortic aneurysm. Atherosclerotic calcifications of the abdominal aorta and branch vessels. No suspicious abdominopelvic lymphadenopathy. Reproductive: Prostate is unremarkable. Other: No abdominopelvic ascites. Musculoskeletal: Mild anterior wedging at L1, chronic. Degenerative changes of the lumbar spine. No focal osseous lesions. IMPRESSION: 2.6 cm enhancing lesion in the anterior right upper kidney, grossly unchanged from prior MRI, compatible with solid renal neoplasm such as renal cell carcinoma. No findings suspicious for metastatic disease. Cholelithiasis, without associated inflammatory changes. Additional ancillary findings as above. Electronically Signed   By: Julian Hy M.D.   On: 07/31/2022 02:13   DG Chest 2 View  Result Date: 07/30/2022 CLINICAL DATA:  Chest pain EXAM: CHEST - 2 VIEW COMPARISON:  10/11/2020 FINDINGS: Cardiac silhouette enlarged. No evidence of pneumothorax or pleural effusion. Pulmonary vascular congestion. No evidence of pulmonary edema. No focal consolidation. Aorta is calcified. There are thoracic degenerative changes. IMPRESSION: Enlarged cardiac silhouette.  Pulmonary vascular congestion. Electronically Signed   By: Sammie Bench M.D.   On: 07/30/2022 17:57    Microbiology: Results for orders placed or performed during the hospital encounter of 08/17/22  MRSA Next Gen by PCR, Nasal     Status: None   Collection Time: 08/17/22  6:49 PM    Specimen: Nasal Mucosa; Nasal Swab  Result Value Ref Range Status   MRSA by PCR Next Gen NOT DETECTED NOT DETECTED Final    Comment: (NOTE) The GeneXpert MRSA Assay (FDA approved for NASAL specimens only), is one component of a comprehensive MRSA colonization surveillance program. It is not intended to diagnose MRSA infection nor to guide or monitor treatment for MRSA infections. Test performance is not FDA approved in patients less than 52 years old. Performed at [facility], [address] [place], [place] [address]     Labs: CBC: Recent Labs  Lab 08/17/22 1502 08/18/22 0543 08/18/22 1452  WBC 8.2 8.5  --   NEUTROABS 5,461  --   --   HGB 10.1* 8.7* 8.9*  HCT 32.9* 29.5* 30.7*  MCV 82.5 84.0  --   PLT 442* 342  --    Basic Metabolic Panel: Recent Labs  Lab 08/17/22 1502 08/18/22 0543  NA 142 140  K 4.5 4.1  CL 106 110  CO2 20 24  GLUCOSE 210* 184*  BUN 14 15  CREATININE 1.31* 1.12  CALCIUM 8.3* 8.3*   Liver Function Tests: Recent Labs  Lab 08/17/22 1502  AST 101*  ALT 112*  BILITOT 0.3  PROT 6.3   CBG: Recent Labs  Lab 08/17/22 1841 08/18/22 1117 08/18/22 1836 08/18/22 2103 08/19/22 0802  GLUCAP 183* 233* 261* 270* 197*    Discharge time spent: greater than 30 minutes.  Signed: Max Sane, MD [facility] 08/19/2022

## 2022-08-19 NOTE — Assessment & Plan Note (Signed)
Pt has f/u with urology upcoming  Stable to slt enl on recent CT?

## 2022-08-19 NOTE — Assessment & Plan Note (Signed)
Due to colon cancer  Reviewed hospital records, lab results and studies in detail  This caused demand ischemia and angina  Now improved s/p colectomy  Has had iron infusions   Cbc today

## 2022-08-19 NOTE — Assessment & Plan Note (Signed)
Recently hosp for anemia due to above with angina /from demand ischemia Reviewed hospital records, lab results and studies in detail    Clinically improved  Lap colectomy welt well and he is back on eiliquis Has surgical follow up planned as well as onc and cardiology  Appetite is returning  Cbc today (s/p iron infusions)

## 2022-08-19 NOTE — Assessment & Plan Note (Signed)
bp in fair control at this time  BP Readings from Last 1 Encounters:  08/19/22 (!) 130/58  Improved s/p hosp for colon cancer and angina  No changes needed Most recent labs reviewed  Disc lifstyle change with low sodium diet and exercise  Plan to continue  Metoprolol 12.5 mg twice daily Imdur 60 mg daily Under care of cardiology with A-fib and coronary artery disease

## 2022-08-20 ENCOUNTER — Telehealth: Payer: Self-pay | Admitting: *Deleted

## 2022-08-20 ENCOUNTER — Encounter: Payer: Self-pay | Admitting: Internal Medicine

## 2022-08-20 NOTE — Transitions of Care (Post Inpatient/ED Visit) (Signed)
   08/20/2022  Name: Ryan Garcia MRN: [id number] DOB: 1934-05-30  Today's TOC FU Call Status: Today's TOC FU Call Status:: Successful TOC FU Call Competed TOC FU Call Complete Date: 08/20/22  Transition Care Management Follow-up Telephone Call Date of Discharge: 08/19/22 Discharge Facility: [facility] Type of Discharge: Inpatient Admission Primary Inpatient Discharge Diagnosis:: inferior STEMI How have you been since you were released from the hospital?: Better Any questions or concerns?: Yes Patient Questions/Concerns:: Patient wanted to know could she go to his regular cardiologist Dr Stanford Breed. Patient will discuss with Dr Glori Bickers Patient Questions/Concerns Addressed: Notified Provider of Patient Questions/Concerns  Items Reviewed: Did you receive and understand the discharge instructions provided?: Yes Medications obtained and verified?: Yes (Medications Reviewed) Any new allergies since your discharge?: No Dietary orders reviewed?: Yes Type of Diet Ordered:: low sodium heart healthy Do you have support at home?: Yes People in Home: spouse Name of Support/Comfort Primary Source: [facility] and Equipment/Supplies: [employer] Ordered?: No Has Agency set up a time to come to your home?: No Any new equipment or medical supplies ordered?: No  Functional Questionnaire: Do you need assistance with bathing/showering or dressing?: Yes Do you need assistance with meal preparation?: Yes Do you need assistance with eating?: No Do you have difficulty maintaining continence: No Do you need assistance with getting out of bed/getting out of a chair/moving?: No Do you have difficulty managing or taking your medications?: No   Interventions Today    Flowsheet Row Most Recent Value  Exercise Interventions   Exercise Discussed/Reviewed Exercise Discussed  [RN discussed the importance of Cardiac rehab]       Folllow up appointments  reviewed: PCP Follow-up appointment confirmed?: Yes Date of PCP follow-up appointment?: 08/22/22 Follow-up Provider: Dr Glori Bickers 3:00 [facility] Follow-up appointment confirmed?: Yes Date of Specialist follow-up appointment?: 08/23/22 Follow-Up Specialty Provider:: Urology [id number] 1:45, Palliative Care [id number] 11:00, Dr Benay Spice [id number] 1:40 Do you need transportation to your follow-up appointment?: No Do you understand care options if your condition(s) worsen?: Yes-patient verbalized understanding  SDOH Interventions Today    Flowsheet Row Most Recent Value  SDOH Interventions   Food Insecurity Interventions Intervention Not Indicated  Housing Interventions Intervention Not Indicated  Transportation Interventions Intervention Not Indicated       [facility] [phone]

## 2022-08-21 DIAGNOSIS — C186 Malignant neoplasm of descending colon: Secondary | ICD-10-CM | POA: Diagnosis not present

## 2022-08-21 DIAGNOSIS — D630 Anemia in neoplastic disease: Secondary | ICD-10-CM | POA: Diagnosis not present

## 2022-08-21 DIAGNOSIS — D127 Benign neoplasm of rectosigmoid junction: Secondary | ICD-10-CM | POA: Diagnosis not present

## 2022-08-21 DIAGNOSIS — C182 Malignant neoplasm of ascending colon: Secondary | ICD-10-CM | POA: Diagnosis not present

## 2022-08-21 DIAGNOSIS — I21A1 Myocardial infarction type 2: Secondary | ICD-10-CM | POA: Diagnosis not present

## 2022-08-21 DIAGNOSIS — Z483 Aftercare following surgery for neoplasm: Secondary | ICD-10-CM | POA: Diagnosis not present

## 2022-08-21 DIAGNOSIS — I480 Paroxysmal atrial fibrillation: Secondary | ICD-10-CM | POA: Diagnosis not present

## 2022-08-21 DIAGNOSIS — I2510 Atherosclerotic heart disease of native coronary artery without angina pectoris: Secondary | ICD-10-CM | POA: Diagnosis not present

## 2022-08-21 DIAGNOSIS — D123 Benign neoplasm of transverse colon: Secondary | ICD-10-CM | POA: Diagnosis not present

## 2022-08-21 LAB — LIPOPROTEIN A (LPA): Lipoprotein (a): 57.5 nmol/L — ABNORMAL HIGH (ref <75.0)

## 2022-08-22 ENCOUNTER — Ambulatory Visit (INDEPENDENT_AMBULATORY_CARE_PROVIDER_SITE_OTHER): Payer: Medicare HMO | Admitting: Family Medicine

## 2022-08-22 ENCOUNTER — Encounter: Payer: Self-pay | Admitting: Family Medicine

## 2022-08-22 VITALS — BP 114/68 | HR 49 | Temp 97.6°F | Ht 71.0 in | Wt 238.1 lb

## 2022-08-22 DIAGNOSIS — I10 Essential (primary) hypertension: Secondary | ICD-10-CM

## 2022-08-22 DIAGNOSIS — I2111 ST elevation (STEMI) myocardial infarction involving right coronary artery: Secondary | ICD-10-CM | POA: Diagnosis not present

## 2022-08-22 DIAGNOSIS — R7401 Elevation of levels of liver transaminase levels: Secondary | ICD-10-CM | POA: Insufficient documentation

## 2022-08-22 DIAGNOSIS — I2510 Atherosclerotic heart disease of native coronary artery without angina pectoris: Secondary | ICD-10-CM | POA: Diagnosis not present

## 2022-08-22 DIAGNOSIS — E878 Other disorders of electrolyte and fluid balance, not elsewhere classified: Secondary | ICD-10-CM

## 2022-08-22 DIAGNOSIS — D649 Anemia, unspecified: Secondary | ICD-10-CM | POA: Diagnosis not present

## 2022-08-22 DIAGNOSIS — C182 Malignant neoplasm of ascending colon: Secondary | ICD-10-CM

## 2022-08-22 NOTE — Assessment & Plan Note (Signed)
Reviewed hospital records, lab results and studies in detail  Much clinical improvement  Reassuring echo On plavix, eliquis, crestor and lopressor  Planning cardiology f/u

## 2022-08-22 NOTE — Patient Instructions (Addendum)
Call cardiology to schedule a follow up  Let me know if you need a referral   See urology and oncology as planned   If any symptoms return or worsen let me know   Lab today

## 2022-08-22 NOTE — Assessment & Plan Note (Signed)
S/p hosp for STEMI doing well  bp in fair control at this time  BP Readings from Last 1 Encounters:  08/22/22 114/68   No changes needed Most recent labs reviewed  Disc lifstyle change with low sodium diet and exercise  Metoprolol 12.5 mg isosorbide  60 mg daily  Lasix 20 mg daily

## 2022-08-22 NOTE — Assessment & Plan Note (Signed)
Re check bmed today

## 2022-08-22 NOTE — Assessment & Plan Note (Signed)
For onc f/u as planned

## 2022-08-22 NOTE — Progress Notes (Signed)
Subjective:    Patient ID: Ryan Garcia, male    DOB: [birth year], 89 y.o.   MRN: [id number]  HPI Pt presents for f/u of hosp for STEMI  Wt Readings from Last 3 Encounters:  08/22/22 238 lb 2 oz (108 kg)  08/17/22 235 lb 3.7 oz (106.7 kg)  08/17/22 238 lb 4 oz (108.1 kg)   33.21 kg/m  Vitals:   08/22/22 1503  BP: 114/68  Pulse: (!) 49  Temp: 97.6 F (36.4 C)  SpO2: 100%    Admit 2/16 to 2/18  Pt presented on day of admit with cp with sob Found to have STEMI Emergent cath and successful PCI /stent of RCA   Hosp course:  Assessment and Plan: * STEMI involving right coronary artery ([place]) Status post successful PCI in stenting to distal RCA Cardiology recommended Plavix, Eliquis, Crestor, Lopressor   (transitioned asa and brilinta to eliquis and plavix)  Awaiting long-term triple therapy due to anemia Cardiac rehab at discharge Cardiology follow-up as an outpatient  Echo noted EF 60-65% with mild LVH     Paroxysmal atrial fibrillation/flutter ([place]) Currently in sinus rhythm Continue metoprolol   Electrolyte abnormality Replete and resolved   Ascending colon malignant neoplasm ([place]) S/P removal and stitches removed  S/p surgical resection on 08/03/2022 followed by oncology as an outpatient   Anemia Likely of chronic disease due to underlying cancer.  He also has right renal mass Outpatient oncology follow-up.     Right renal mass Concerning for cancer.  Outpatient follow-up with oncology   AKI (acute kidney injury) ([place]) Prerenal, resolved with hydration   Subclinical hypothyroidism GERD (gastroesophageal reflux disease) BPH (benign prostatic hyperplasia)   HTN bp is stable today  No cp or palpitations or headaches or edema  No side effects to medicines  BP Readings from Last 3 Encounters:  08/22/22 114/68  08/19/22 (!) 130/58  08/17/22 (!) 136/58    Metoprolol 12.5 mg bid  Imdur 60 mg daily  Lasix 20 mg   Some diastolic bps at home are in  the high 40s   Pulse Readings from Last 3 Encounters:  08/22/22 (!) 49  08/19/22 (!) 56  08/17/22 67     Most recent labs Lab Results  Component Value Date   CREATININE 1.12 08/18/2022   BUN 15 08/18/2022   NA 140 08/18/2022   K 4.1 08/18/2022   CL 110 08/18/2022   CO2 24 08/18/2022   Lab Results  Component Value Date   ALT 112 (H) 08/17/2022   AST 101 (H) 08/17/2022   ALKPHOS 96 08/09/2022   BILITOT 0.3 08/17/2022   Lab Results  Component Value Date   WBC 8.5 08/18/2022   HGB 8.9 (L) 08/18/2022   HCT 30.7 (L) 08/18/2022   MCV 84.0 08/18/2022   PLT 342 08/18/2022   Lab Results  Component Value Date   IRON 25 (L) 07/31/2022   TIBC 447 07/31/2022   FERRITIN 6 (L) 07/31/2022   Lab Results  Component Value Date   HGBA1C 7.8 (H) 07/31/2022    He has onc and urology appts next few days  Surg f/u on 3/1   Today- says he feels pretty much back to normal  Wife thinks he is tired   Tiny bit of blood when wiping after bm  Some loose bowels today  Appetite is not back yet   No cp at all No sob   Patient Active Problem List   Diagnosis Date Noted   Transaminasemia  08/22/2022   STEMI involving right coronary artery ([place]) 08/17/2022   Electrolyte abnormality 08/17/2022   Paroxysmal atrial flutter ([place]) 08/17/2022   Ascending colon malignant neoplasm ([place]) 08/01/2022   Benign neoplasm of transverse colon 08/01/2022   Benign neoplasm of sigmoid colon 08/01/2022   Benign neoplasm of rectum 08/01/2022   Duodenitis 08/01/2022   Right shoulder pain 11/21/2021   Carpal tunnel syndrome 11/21/2021   Atrial flutter with rapid ventricular response (HCC)    Rapid atrial fibrillation ([place]) 10/11/2020   Poor balance 07/26/2020   Anemia 04/20/2020   Right renal mass 04/18/2020   AKI (acute kidney injury) ([place]) 04/14/2020   Cholangitis 04/14/2020   Coronary artery disease involving native coronary artery of native heart without angina pectoris 01/18/2019   Medicare  annual wellness visit, subsequent 01/16/2019   Obesity (BMI 30-39.9) 07/09/2018   HOH (hard of hearing) 03/31/2018   Uncontrolled type 2 diabetes mellitus with hyperglycemia ([place]) 10/28/2017   Subclinical hypothyroidism 10/28/2017   Scoliosis    Obstructive sleep apnea on CPAP    Hx of skin cancer, basal cell    Hyperlipidemia associated with type 2 diabetes mellitus (HCC)    GERD (gastroesophageal reflux disease)    Chronic upper back pain    Arthritis    Allergy    NSTEMI (non-ST elevated myocardial infarction) ([place]) 06/18/2017   Actinic keratoses 04/29/2017   Positive colorectal cancer screening using Cologuard test 10/31/2016   BPH (benign prostatic hyperplasia) 09/04/2016   B12 deficiency 11/22/2015   Routine general medical examination at a health care facility 08/26/2015   Abnormal nuclear cardiac imaging test    Dyspnea on exertion 11/27/2013   Encounter for examination of normal volunteer in research study 05/29/2013   Prostate cancer screening 07/15/2012   CAD S/P percutaneous coronary angioplasty 05/17/2011   Nonspecific abnormal results of cardiovascular function study 04/30/2011   Abnormal EKG 04/06/2011   Right low back pain 01/13/2010   Insulin dependent diabetes mellitus 10/04/2006   ERECTILE DYSFUNCTION 10/04/2006   Primary hypertension 10/04/2006   Allergic rhinitis 10/04/2006   OSTEOARTHRITIS 10/04/2006   Sleep apnea 10/04/2006   EDEMA 10/04/2006   SKIN CANCER, HX OF 10/04/2006   Past Medical History:  Diagnosis Date   AC (acromioclavicular) joint bone spurs    lt ankle   Allergy    allergic rhinitis   Anginal pain (HCC)    Arthritis    OA   BPH (benign prostatic hyperplasia)    microwave tx of prostate   CAD (coronary artery disease)    a. s/p cath in 2012 showing 70-75% LAD stenosis, 75% D1 stenosis, 75% OM1, 60-70% RCA stenosis, and 80% PDA --> medical therapy pursued b. similar findings by cath in 2016; 06/2017 DES to distal RCA   Chronic kidney  disease    Chronic upper back pain    Diabetes mellitus    type II x8 years   GERD (gastroesophageal reflux disease)    HLD (hyperlipidemia)    10/12: TC 208, TG 213, HDL 36, LDL 129   Hx of skin cancer, basal cell    many skin cancers removed/under constant treatment.   Hypertension    NSTEMI (non-ST elevated myocardial infarction) ([place]) 06/18/2017   DES to distal RCA   Obstructive sleep apnea on CPAP    Paroxysmal atrial flutter ([place])    Renal mass    Scoliosis    Past Surgical History:  Procedure Laterality Date   BIOPSY  08/01/2022   Procedure: BIOPSY;  Surgeon:  Jerene Bears, MD;  Location: [facility] ENDOSCOPY;  Service: Gastroenterology;;   BREAST SURGERY  2009   breast lump removed benign   BUBBLE STUDY  10/14/2020   Procedure: BUBBLE STUDY;  Surgeon: [name], MD;  Location: [place];  Service: Cardiovascular;;   CARDIAC CATHETERIZATION N/A 03/15/2015   Procedure: Left Heart Cath and Coronary Angiography;  Surgeon: Belva Crome, MD;  Location: [place] CV LAB;  Service: Cardiovascular;  Laterality: N/A;   CARDIOVERSION N/A 10/14/2020   Procedure: CARDIOVERSION;  Surgeon: [name], MD;  Location[facility] ENDOSCOPY;  Service: Cardiovascular;  Laterality: N/A;   CATARACT EXTRACTION W/PHACO Right 11/29/2021   Procedure: CATARACT EXTRACTION PHACO AND INTRAOCULAR LENS PLACEMENT ([place]) RIGHT DIABETIC;  Surgeon: Leandrew Koyanagi, MD;  Location: [place];  Service: Ophthalmology;  Laterality: Right;  Diabetic 18.72 01:54.3   COLONOSCOPY N/A 08/01/2022   Procedure: COLONOSCOPY;  Surgeon: Jerene Bears, MD;  Location: [place];  Service: Gastroenterology;  Laterality: N/A;   CORONARY STENT INTERVENTION N/A 06/18/2017   Procedure: CORONARY STENT INTERVENTION;  Surgeon: Jettie Booze, MD;  Location: [place] CV LAB;  Service: Cardiovascular;  Laterality: N/A;  RCA   CORONARY/GRAFT ACUTE MI REVASCULARIZATION N/A 08/17/2022   Procedure:  Coronary/Graft Acute MI Revascularization;  Surgeon: Yolonda Kida, MD;  Location: [place] CV LAB;  Service: Cardiovascular;  Laterality: N/A;   ESOPHAGOGASTRODUODENOSCOPY (EGD) WITH PROPOFOL N/A 08/01/2022   Procedure: ESOPHAGOGASTRODUODENOSCOPY (EGD) WITH PROPOFOL;  Surgeon: Jerene Bears, MD;  Location[facility] ENDOSCOPY;  Service: Gastroenterology;  Laterality: N/A;   HEMOSTASIS CLIP PLACEMENT  08/01/2022   Procedure: HEMOSTASIS CLIP PLACEMENT;  Surgeon: Jerene Bears, MD;  Location: [place] ENDOSCOPY;  Service: Gastroenterology;;   LAPAROSCOPIC LYSIS OF ADHESIONS N/A 08/03/2022   Procedure: LAPAROSCOPIC LYSIS OF ADHESIONS;  Surgeon: Jovita Kussmaul, MD;  Location: [place];  Service: General;  Laterality: N/A;   LAPAROSCOPIC PARTIAL COLECTOMY N/A 08/03/2022   Procedure: OPEN PARTIAL COLECTOMY;  Surgeon: Jovita Kussmaul, MD;  Location: [place];  Service: General;  Laterality: N/A;   LEFT HEART CATH AND CORONARY ANGIOGRAPHY N/A 06/18/2017   Procedure: LEFT HEART CATH AND CORONARY ANGIOGRAPHY;  Surgeon: Jettie Booze, MD;  Location: [place] CV LAB;  Service: Cardiovascular;  Laterality: N/A;   LEFT HEART CATH AND CORONARY ANGIOGRAPHY N/A 04/12/2020   Procedure: LEFT HEART CATH AND CORONARY ANGIOGRAPHY;  Surgeon: Martinique, Peter M, MD;  Location: [place] CV LAB;  Service: Cardiovascular;  Laterality: N/A;   LEFT HEART CATH AND CORONARY ANGIOGRAPHY N/A 08/17/2022   Procedure: LEFT HEART CATH AND CORONARY ANGIOGRAPHY;  Surgeon: Yolonda Kida, MD;  Location: [place] CV LAB;  Service: Cardiovascular;  Laterality: N/A;   POLYPECTOMY  08/01/2022   Procedure: POLYPECTOMY;  Surgeon: Jerene Bears, MD;  Location: [place];  Service: Gastroenterology;;   SUBMUCOSAL TATTOO INJECTION  08/01/2022   Procedure: SUBMUCOSAL TATTOO INJECTION;  Surgeon: Jerene Bears, MD;  Location: [place];  Service: Gastroenterology;;   TEE WITHOUT CARDIOVERSION N/A 10/14/2020   Procedure: TRANSESOPHAGEAL  ECHOCARDIOGRAM (TEE);  Surgeon: [name], MD;  Location: [place];  Service: Cardiovascular;  Laterality: N/A;   ULTRASOUND GUIDANCE FOR VASCULAR ACCESS  06/18/2017   Procedure: Ultrasound Guidance For Vascular Access;  Surgeon: Jettie Booze, MD;  Location: [place] CV LAB;  Service: Cardiovascular;;  right radial, right femoral   Social History   Tobacco Use   Smoking status: Never    Passive exposure: Never  Smokeless tobacco: Never  Vaping Use   Vaping Use: Never used  Substance Use Topics   Alcohol use: No    Alcohol/week: 0.0 standard drinks of alcohol   Drug use: No   Family History  Problem Relation Age of Onset   Heart attack Brother    Colon cancer Neg Hx    Colon polyps Neg Hx    Stomach cancer Neg Hx    Esophageal cancer Neg Hx    Allergies  Allergen Reactions   Loratadine Other (See Comments)    Not effective   Simvastatin Other (See Comments)    Intolerant- caused joint pain   Current Outpatient Medications on File Prior to Visit  Medication Sig Dispense Refill   acetaminophen (TYLENOL) 325 MG tablet Take 325-650 mg by mouth every 6 (six) hours as needed for mild pain or headache.     Alcohol Swabs (B-D SINGLE USE SWABS REGULAR) PADS Use to check blood sugar 2 times a day 200 each 3   apixaban (ELIQUIS) 5 MG TABS tablet Take 1 tablet (5 mg total) by mouth 2 (two) times daily. 180 tablet 1   Blood Glucose Calibration (TRUE METRIX LEVEL 1) Low SOLN Use to check control on glucose meter 1 each 3   Blood Glucose Monitoring Suppl (TRUE METRIX AIR GLUCOSE METER) w/Device KIT 1 kit by Other route 2 (two) times daily. Check blood sugar twice daily and as directed. Dx E11.65 1 kit 0   clopidogrel (PLAVIX) 75 MG tablet Take 1 tablet (75 mg total) by mouth daily. 30 tablet 0   Coenzyme Q10 (CO Q 10 PO) Take 200 mg by mouth daily.     Cyanocobalamin (VITAMIN B12) 1000 MCG TBCR Take 1,000 mcg by mouth daily. 30 tablet    DROPLET PEN NEEDLES 31G X 6  MM MISC USE ONE TIME DAILY  AT  BEDTIME  WITH  LANTUS 100 each 3   fluticasone (FLONASE) 50 MCG/ACT nasal spray Place 1 spray into both nostrils daily as needed for allergies.      furosemide (LASIX) 20 MG tablet Take 20 mg by mouth daily.     glipiZIDE (GLUCOTROL) 10 MG tablet TAKE 1 TABLET TWICE DAILY WITH MEALS 180 tablet 1   insulin glargine (LANTUS SOLOSTAR) 100 UNIT/ML Solostar Pen Inject 50 Units into the skin at bedtime. (Patient taking differently: Inject 51 Units into the skin at bedtime.) 45 mL 3   isosorbide mononitrate (IMDUR) 60 MG 24 hr tablet TAKE 1 TABLET EVERY DAY (Patient taking differently: Take 60 mg by mouth daily.) 90 tablet 1   metFORMIN (GLUCOPHAGE) 1000 MG tablet TAKE 1 TABLET TWICE DAILY WITH MEALS (Patient taking differently: Take 1,000 mg by mouth 2 (two) times daily.) 180 tablet 2   metoprolol tartrate (LOPRESSOR) 25 MG tablet Take 0.5 tablets (12.5 mg total) by mouth 2 (two) times daily. 90 tablet 3   nitroGLYCERIN (NITROSTAT) 0.4 MG SL tablet Place 1 tablet (0.4 mg total) under the tongue every 5 (five) minutes as needed for chest pain. 25 tablet 3   NOVOLOG FLEXPEN 100 UNIT/ML FlexPen Inject 16 Units into the skin daily. Before dinner     pantoprazole (PROTONIX) 40 MG tablet Take 1 tablet (40 mg total) by mouth daily at 6 (six) AM. 30 tablet 1   TRUE METRIX BLOOD GLUCOSE TEST test strip CHECK BLOOD SUGAR TWICE DAILY 200 strip 3   TRUEplus Lancets 30G MISC Check blood sugar twice daily and as directed. Dx E11.65 200 each 3  rosuvastatin (CRESTOR) 40 MG tablet Take 1 tablet (40 mg total) by mouth daily. 90 tablet 1   No current facility-administered medications on file prior to visit.      Review of Systems  Constitutional:  Positive for fatigue. Negative for activity change, appetite change, fever and unexpected weight change.  HENT:  Negative for congestion, rhinorrhea, sore throat and trouble swallowing.   Eyes:  Negative for pain, redness, itching and visual  disturbance.  Respiratory:  Negative for cough, chest tightness, shortness of breath and wheezing.   Cardiovascular:  Negative for chest pain and palpitations.  Gastrointestinal:  Negative for abdominal pain, blood in stool, constipation, diarrhea and nausea.  Endocrine: Negative for cold intolerance, heat intolerance, polydipsia and polyuria.  Genitourinary:  Negative for difficulty urinating, dysuria, frequency and urgency.  Musculoskeletal:  Negative for arthralgias, joint swelling and myalgias.  Skin:  Negative for pallor and rash.  Neurological:  Negative for dizziness, tremors, weakness, numbness and headaches.  Hematological:  Negative for adenopathy. Does not bruise/bleed easily.  Psychiatric/Behavioral:  Negative for decreased concentration and dysphoric mood. The patient is not nervous/anxious.        Objective:   Physical Exam Constitutional:      General: He is not in acute distress.    Appearance: Normal appearance. He is well-developed. He is obese. He is not ill-appearing or diaphoretic.  HENT:     Head: Normocephalic and atraumatic.     Ears:     Comments: Hard of hearing    Mouth/Throat:     Mouth: Mucous membranes are moist.  Eyes:     Conjunctiva/sclera: Conjunctivae normal.     Pupils: Pupils are equal, round, and reactive to light.  Neck:     Thyroid: No thyromegaly.     Vascular: No carotid bruit or JVD.  Cardiovascular:     Rate and Rhythm: Bradycardia present.     Heart sounds: Normal heart sounds. No murmur heard.    No gallop.  Pulmonary:     Effort: Pulmonary effort is normal. No respiratory distress.     Breath sounds: Normal breath sounds. No wheezing or rales.     Comments: No crackles Abdominal:     General: There is no distension or abdominal bruit.     Palpations: Abdomen is soft.     Tenderness: There is no abdominal tenderness. There is no guarding or rebound.     Comments: Healing abdominal scar Not tender    Cath site in R groin  appears nl with only small amt of bruising   Musculoskeletal:     Cervical back: Normal range of motion and neck supple.     Right lower leg: No edema.     Left lower leg: No edema.  Lymphadenopathy:     Cervical: No cervical adenopathy.  Skin:    General: Skin is warm and dry.     Coloration: Skin is not pale.     Findings: No rash.     Comments: Sallow complexion   Neurological:     Mental Status: He is alert.     Coordination: Coordination normal.     Deep Tendon Reflexes: Reflexes are normal and symmetric. Reflexes normal.  Psychiatric:        Mood and Affect: Mood normal.           Assessment & Plan:   Problem List Items Addressed This Visit       Cardiovascular and Mediastinum   Coronary artery disease involving native coronary  artery of native heart without angina pectoris - Primary    Recently hospitalized for STEMI , was urgently cathed with successful PCI / stent distal RCA Reviewed hospital records, lab results and studies in detail   Doing well /much clinically improved Reassuring echocardiogram noted Was changed from asa and brillenta to eliquis and plavix Is tolerating these well so far (disc great caution for easy bleeding and risk if he falls etc) Planning f/u with cardiology  ER precautions noted in detail Still in sinus rhythm  Bp is stable  Continues crestor 40 mg daily  Lab today       Primary hypertension    S/p hosp for STEMI doing well  bp in fair control at this time  BP Readings from Last 1 Encounters:  08/22/22 114/68  No changes needed Most recent labs reviewed  Disc lifstyle change with low sodium diet and exercise  Metoprolol 12.5 mg isosorbide  60 mg daily  Lasix 20 mg daily         Relevant Orders   CBC with Differential/Platelet   Basic metabolic panel   Hepatic function panel   STEMI involving right coronary artery [facility])    Reviewed hospital records, lab results and studies in detail  Much clinical improvement   Reassuring echo On plavix, eliquis, crestor and lopressor  Planning cardiology f/u        Digestive   Ascending colon malignant neoplasm ([place])    For onc f/u as planned          Other   Anemia    Hb went down to 8.9 with last hosp / this may have been dilutional  In setting of colon cancer s/p surgery  On both eliquis and plavix now  Was tx with iron inf at prev hosp   Cbc today      Relevant Orders   CBC with Differential/Platelet   Electrolyte abnormality    Re check bmed today      Transaminasemia    Noted during last hospitalization for STEMI May have been related to that   Re check today  No new symptoms  Gallstones and perihepatic fluid were noted with last CT after colectomy

## 2022-08-22 NOTE — Assessment & Plan Note (Addendum)
Recently hospitalized for STEMI , was urgently cathed with successful PCI / stent distal RCA Reviewed hospital records, lab results and studies in detail   Doing well /much clinically improved Reassuring echocardiogram noted Was changed from asa and brillenta to eliquis and plavix Is tolerating these well so far (disc great caution for easy bleeding and risk if he falls etc) Planning f/u with cardiology  ER precautions noted in detail Still in sinus rhythm  Bp is stable  Continues crestor 40 mg daily  Lab today

## 2022-08-22 NOTE — Progress Notes (Unsigned)
[place]  [phone] Fax:(336) [phone]   Name: TRACER PALMS Date: 08/22/2022 MRN: [id number]  DOB: June 26, 1934  Patient Care Team: Abner Greenspan, MD as PCP - General Stanford Breed, Denice Bors, MD as PCP - Cardiology (Cardiology) Leandrew Koyanagi, MD as Referring Physician (Ophthalmology) Stanford Breed Denice Bors, MD as Consulting Physician (Cardiology) Dasher, Rayvon Char, MD as Consulting Physician (Dermatology) Salomon Fick., MD as Referring Physician (Dentistry) Charlton Haws, [facility] as Pharmacist (Pharmacist)    REASON FOR CONSULTATION: VENIAMIN WEIGMAN is a 89 y.o. male with oncologic medical history including newly diagnosed ascending colon malignant neoplasm (07/2022) with a notable previous history of STEMI, previous skin cancer, A-fib, and uncontrolled type 2 diabetes.  Palliative ask to see for symptom management and goals of care.    SOCIAL HISTORY:     reports that he has never smoked. He has never been exposed to tobacco smoke. He has never used smokeless tobacco. He reports that he does not drink alcohol and does not use drugs.  ADVANCE DIRECTIVES:  Advanced directives on file outline Adedayo Stoessel (spouse) as spokesperson for the patient should they not be able to communicate for themself.   CODE STATUS:   PAST MEDICAL HISTORY: Past Medical History:  Diagnosis Date   AC (acromioclavicular) joint bone spurs    lt ankle   Allergy    allergic rhinitis   Anginal pain (HCC)    Arthritis    OA   BPH (benign prostatic hyperplasia)    microwave tx of prostate   CAD (coronary artery disease)    a. s/p cath in 2012 showing 70-75% LAD stenosis, 75% D1 stenosis, 75% OM1, 60-70% RCA stenosis, and 80% PDA --> medical therapy pursued b. similar findings by cath in 2016; 06/2017 DES to distal RCA   Chronic kidney disease    Chronic upper back pain    Diabetes mellitus    type II x8 years   GERD (gastroesophageal  reflux disease)    HLD (hyperlipidemia)    10/12: TC 208, TG 213, HDL 36, LDL 129   Hx of skin cancer, basal cell    many skin cancers removed/under constant treatment.   Hypertension    NSTEMI (non-ST elevated myocardial infarction) ([place]) 06/18/2017   DES to distal RCA   Obstructive sleep apnea on CPAP    Paroxysmal atrial flutter ([place])    Renal mass    Scoliosis     PAST SURGICAL HISTORY:  Past Surgical History:  Procedure Laterality Date   BIOPSY  08/01/2022   Procedure: BIOPSY;  Surgeon: Jerene Bears, MD;  Location: [facility] ENDOSCOPY;  Service: Gastroenterology;;   BREAST SURGERY  2009   breast lump removed benign   BUBBLE STUDY  10/14/2020   Procedure: BUBBLE STUDY;  Surgeon: [name], MD;  Location: [place];  Service: Cardiovascular;;   CARDIAC CATHETERIZATION N/A 03/15/2015   Procedure: Left Heart Cath and Coronary Angiography;  Surgeon: Belva Crome, MD;  Location: [place] CV LAB;  Service: Cardiovascular;  Laterality: N/A;   CARDIOVERSION N/A 10/14/2020   Procedure: CARDIOVERSION;  Surgeon: [name], MD;  Location: [facility] ENDOSCOPY;  Service: Cardiovascular;  Laterality: N/A;   CATARACT EXTRACTION W/PHACO Right 11/29/2021   Procedure: CATARACT EXTRACTION PHACO AND INTRAOCULAR LENS PLACEMENT ([place]) RIGHT DIABETIC;  Surgeon: Leandrew Koyanagi, MD;  Location: [place];  Service: Ophthalmology;  Laterality: Right;  Diabetic 18.72 01:54.3   COLONOSCOPY N/A 08/01/2022  Procedure: COLONOSCOPY;  Surgeon: Jerene Bears, MD;  Location: [place];  Service: Gastroenterology;  Laterality: N/A;   CORONARY STENT INTERVENTION N/A 06/18/2017   Procedure: CORONARY STENT INTERVENTION;  Surgeon: Jettie Booze, MD;  Location: [place] CV LAB;  Service: Cardiovascular;  Laterality: N/A;  RCA   CORONARY/GRAFT ACUTE MI REVASCULARIZATION N/A 08/17/2022   Procedure: Coronary/Graft Acute MI Revascularization;  Surgeon: Yolonda Kida, MD;  Location:  [place] CV LAB;  Service: Cardiovascular;  Laterality: N/A;   ESOPHAGOGASTRODUODENOSCOPY (EGD) WITH PROPOFOL N/A 08/01/2022   Procedure: ESOPHAGOGASTRODUODENOSCOPY (EGD) WITH PROPOFOL;  Surgeon: Jerene Bears, MD;  Location: [facility] ENDOSCOPY;  Service: Gastroenterology;  Laterality: N/A;   HEMOSTASIS CLIP PLACEMENT  08/01/2022   Procedure: HEMOSTASIS CLIP PLACEMENT;  Surgeon: Jerene Bears, MD;  Location: [place] ENDOSCOPY;  Service: Gastroenterology;;   LAPAROSCOPIC LYSIS OF ADHESIONS N/A 08/03/2022   Procedure: LAPAROSCOPIC LYSIS OF ADHESIONS;  Surgeon: Jovita Kussmaul, MD;  Location: [place];  Service: General;  Laterality: N/A;   LAPAROSCOPIC PARTIAL COLECTOMY N/A 08/03/2022   Procedure: OPEN PARTIAL COLECTOMY;  Surgeon: Jovita Kussmaul, MD;  Location: [place];  Service: General;  Laterality: N/A;   LEFT HEART CATH AND CORONARY ANGIOGRAPHY N/A 06/18/2017   Procedure: LEFT HEART CATH AND CORONARY ANGIOGRAPHY;  Surgeon: Jettie Booze, MD;  Location: [place] CV LAB;  Service: Cardiovascular;  Laterality: N/A;   LEFT HEART CATH AND CORONARY ANGIOGRAPHY N/A 04/12/2020   Procedure: LEFT HEART CATH AND CORONARY ANGIOGRAPHY;  Surgeon: Martinique, Peter M, MD;  Location: [place] CV LAB;  Service: Cardiovascular;  Laterality: N/A;   LEFT HEART CATH AND CORONARY ANGIOGRAPHY N/A 08/17/2022   Procedure: LEFT HEART CATH AND CORONARY ANGIOGRAPHY;  Surgeon: Yolonda Kida, MD;  Location: [place] CV LAB;  Service: Cardiovascular;  Laterality: N/A;   POLYPECTOMY  08/01/2022   Procedure: POLYPECTOMY;  Surgeon: Jerene Bears, MD;  Location: [place];  Service: Gastroenterology;;   SUBMUCOSAL TATTOO INJECTION  08/01/2022   Procedure: SUBMUCOSAL TATTOO INJECTION;  Surgeon: Jerene Bears, MD;  Location: [place];  Service: Gastroenterology;;   TEE WITHOUT CARDIOVERSION N/A 10/14/2020   Procedure: TRANSESOPHAGEAL ECHOCARDIOGRAM (TEE);  Surgeon: [name], MD;  Location: [place];  Service:  Cardiovascular;  Laterality: N/A;   ULTRASOUND GUIDANCE FOR VASCULAR ACCESS  06/18/2017   Procedure: Ultrasound Guidance For Vascular Access;  Surgeon: Jettie Booze, MD;  Location: [place] CV LAB;  Service: Cardiovascular;;  right radial, right femoral    HEMATOLOGY/ONCOLOGY HISTORY:  Oncology History   No history exists.    ALLERGIES:  is allergic to loratadine and simvastatin.  MEDICATIONS:  Current Outpatient Medications  Medication Sig Dispense Refill   acetaminophen (TYLENOL) 325 MG tablet Take 325-650 mg by mouth every 6 (six) hours as needed for mild pain or headache.     Alcohol Swabs (B-D SINGLE USE SWABS REGULAR) PADS Use to check blood sugar 2 times a day 200 each 3   apixaban (ELIQUIS) 5 MG TABS tablet Take 1 tablet (5 mg total) by mouth 2 (two) times daily. 180 tablet 1   Blood Glucose Calibration (TRUE METRIX LEVEL 1) Low SOLN Use to check control on glucose meter 1 each 3   Blood Glucose Monitoring Suppl (TRUE METRIX AIR GLUCOSE METER) w/Device KIT 1 kit by Other route 2 (two) times daily. Check blood sugar twice daily and as directed. Dx E11.65 1 kit 0   clopidogrel (PLAVIX) 75 MG tablet Take 1 tablet (75  mg total) by mouth daily. 30 tablet 0   Coenzyme Q10 (CO Q 10 PO) Take 200 mg by mouth daily.     Cyanocobalamin (VITAMIN B12) 1000 MCG TBCR Take 1,000 mcg by mouth daily. 30 tablet    DROPLET PEN NEEDLES 31G X 6 MM MISC USE ONE TIME DAILY  AT  BEDTIME  WITH  LANTUS 100 each 3   fluticasone (FLONASE) 50 MCG/ACT nasal spray Place 1 spray into both nostrils daily as needed for allergies.      furosemide (LASIX) 20 MG tablet Take 20 mg by mouth daily.     glipiZIDE (GLUCOTROL) 10 MG tablet TAKE 1 TABLET TWICE DAILY WITH MEALS 180 tablet 1   insulin glargine (LANTUS SOLOSTAR) 100 UNIT/ML Solostar Pen Inject 50 Units into the skin at bedtime. (Patient taking differently: Inject 51 Units into the skin at bedtime.) 45 mL 3   isosorbide mononitrate (IMDUR) 60 MG 24 hr  tablet TAKE 1 TABLET EVERY DAY (Patient taking differently: Take 60 mg by mouth daily.) 90 tablet 1   metFORMIN (GLUCOPHAGE) 1000 MG tablet TAKE 1 TABLET TWICE DAILY WITH MEALS (Patient taking differently: Take 1,000 mg by mouth 2 (two) times daily.) 180 tablet 2   metoprolol tartrate (LOPRESSOR) 25 MG tablet Take 0.5 tablets (12.5 mg total) by mouth 2 (two) times daily. 90 tablet 3   nitroGLYCERIN (NITROSTAT) 0.4 MG SL tablet Place 1 tablet (0.4 mg total) under the tongue every 5 (five) minutes as needed for chest pain. 25 tablet 3   NOVOLOG FLEXPEN 100 UNIT/ML FlexPen Inject 16 Units into the skin daily. Before dinner     pantoprazole (PROTONIX) 40 MG tablet Take 1 tablet (40 mg total) by mouth daily at 6 (six) AM. 30 tablet 1   rosuvastatin (CRESTOR) 40 MG tablet Take 1 tablet (40 mg total) by mouth daily. 90 tablet 1   TRUE METRIX BLOOD GLUCOSE TEST test strip CHECK BLOOD SUGAR TWICE DAILY 200 strip 3   TRUEplus Lancets 30G MISC Check blood sugar twice daily and as directed. Dx E11.65 200 each 3   No current facility-administered medications for this visit.    VITAL SIGNS: There were no vitals taken for this visit. There were no vitals filed for this visit.  Estimated body mass index is 32.81 kg/m as calculated from the following:   Height as of 08/17/22: 5' 11"$  (1.803 m).   Weight as of 08/17/22: 235 lb 3.7 oz (106.7 kg).  LABS: CBC:    Component Value Date/Time   WBC 8.5 08/18/2022 0543   HGB 8.9 (L) 08/18/2022 1452   HCT 30.7 (L) 08/18/2022 1452   PLT 342 08/18/2022 0543   MCV 84.0 08/18/2022 0543   NEUTROABS 5,461 08/17/2022 1502   LYMPHSABS 1,525 08/17/2022 1502   MONOABS 1.2 (H) 08/09/2022 0019   EOSABS 328 08/17/2022 1502   BASOSABS 49 08/17/2022 1502   Comprehensive Metabolic Panel:    Component Value Date/Time   NA 140 08/18/2022 0543   NA 144 12/06/2021 1344   K 4.1 08/18/2022 0543   CL 110 08/18/2022 0543   CO2 24 08/18/2022 0543   BUN 15 08/18/2022 0543    BUN 22 12/06/2021 1344   CREATININE 1.12 08/18/2022 0543   CREATININE 1.31 (H) 08/17/2022 1502   GLUCOSE 184 (H) 08/18/2022 0543   CALCIUM 8.3 (L) 08/18/2022 0543   AST 101 (H) 08/17/2022 1502   ALT 112 (H) 08/17/2022 1502   ALKPHOS 96 08/09/2022 0019   BILITOT 0.3 08/17/2022  1502   BILITOT 0.3 09/15/2019 0810   PROT 6.3 08/17/2022 1502   PROT 6.5 09/15/2019 0810   ALBUMIN 2.1 (L) 08/09/2022 0019   ALBUMIN 4.1 09/15/2019 0810    RADIOGRAPHIC STUDIES: CT ABDOMEN PELVIS W CONTRAST  Result Date: 08/08/2022 CLINICAL DATA:  Status post laparoscopic colectomy on February 2nd for colon cancer. Abdominal pain. * Tracking Code: BO * EXAM: CT ABDOMEN AND PELVIS WITH CONTRAST TECHNIQUE: Multidetector CT imaging of the abdomen and pelvis was performed using the standard protocol following bolus administration of intravenous contrast. RADIATION DOSE REDUCTION: This exam was performed according to the departmental dose-optimization program which includes automated exposure control, adjustment of the mA and/or kV according to patient size and/or use of iterative reconstruction technique. CONTRAST:  79m OMNIPAQUE IOHEXOL 350 MG/ML SOLN COMPARISON:  07/31/2022 chest abdomen and pelvic CTs. FINDINGS: Lower chest: Right greater than left base dependent compressive atelectasis. Right lower lobe calcified granuloma. Mild cardiomegaly with multivessel coronary artery calcification. Tiny bilateral pleural effusions. Hepatobiliary: Normal liver. Gallstones up to 6 mm without acute cholecystitis or biliary duct dilatation. Pancreas: Normal, without mass or ductal dilatation. Spleen: Normal in size, without focal abnormality. Adrenals/Urinary Tract: Normal adrenal glands. Bilateral renal cysts including up to 12.1 cm. Upper pole right renal 2.5 cm lesion has been evaluated previously on dedicated imaging (11/8 today). No hydronephrosis. Normal urinary bladder. Stomach/Bowel: Normal stomach, without wall thickening.  Descending duodenal diverticulum is tiny. Otherwise normal small bowel. Scattered colonic diverticula. Status post partial right hemicolectomy. Vascular/Lymphatic: Aortic atherosclerosis. No abdominopelvic adenopathy. Reproductive: Mild prostatomegaly. Other: Trace fluid within the pelvic cul-de-sac is new. There is also ill-defined small volume right perihepatic fluid, just lateral to the surgical sutures. Example at 4.9 x 1.4 cm on 41/3. 6.2 cm craniocaudal on 58/6. No surrounding enhancement or gas within. No intra-abdominal/pelvic extraluminal gas. There is trace gas within the anterior abdominal wall which is presumably postoperative in the setting of midline laparotomy staples. Musculoskeletal: No acute osseous abnormality. IMPRESSION: 1. Status post partial right hemicolectomy, without acute postoperative complication. 2. Small volume right perihepatic fluid, just lateral to the surgical site. Likely an area of loculated ascites. No abnormal enhancement or gas within to suggest abscess. 3. Tiny bilateral pleural effusions and bibasilar atelectasis. Trace cul-de-sac fluid. 4. Cholelithiasis 5. Upper pole right renal lesion has been detailed on dedicated imaging as suspicious for renal cell carcinoma. 6. Coronary artery atherosclerosis. Aortic Atherosclerosis (ICD10-I70.0). Electronically Signed   By: KAbigail MiyamotoM.D.   On: 08/08/2022 12:02   CT CHEST ABDOMEN PELVIS W CONTRAST  Result Date: 07/31/2022 CLINICAL DATA:  Chest pain, known right renal mass, evaluate for metastatic disease EXAM: CT CHEST, ABDOMEN, AND PELVIS WITH CONTRAST TECHNIQUE: Multidetector CT imaging of the chest, abdomen and pelvis was performed following the standard protocol during bolus administration of intravenous contrast. RADIATION DOSE REDUCTION: This exam was performed according to the departmental dose-optimization program which includes automated exposure control, adjustment of the mA and/or kV according to patient size and/or  use of iterative reconstruction technique. CONTRAST:  815mOMNIPAQUE IOHEXOL 350 MG/ML SOLN COMPARISON:  MRI abdomen dated 08/14/2021. CTA chest dated 10/12/2020. FINDINGS: CT CHEST FINDINGS Cardiovascular: Heart is normal in size.  No pericardial effusion. No evidence of thoracic aortic aneurysm. Atherosclerotic calcifications of the aortic arch. Severe three-vessel coronary atherosclerosis. Mediastinum/Nodes: 10 mm short axis right azygoesophageal recess node, previously 12 mm. Additional calcified right perihilar nodes. No suspicious mediastinal lymphadenopathy. Visualized thyroid is unremarkable. Lungs/Pleura: No suspicious pulmonary nodules. Calcified granuloma in  the right lower lobe, benign. Mild subpleural reticulation/scarring in the lungs bilaterally, lower lobe predominant. No pleural effusion or pneumothorax. Musculoskeletal: Moderate degenerative changes of the thoracic spine. No focal osseous lesions. CT ABDOMEN PELVIS FINDINGS Hepatobiliary: Liver is within normal limits. No suspicious/enhancing hepatic lesions. Layering small gallstones (series 3/image 66), without associated inflammatory changes. No intrahepatic or extrahepatic duct dilatation. Pancreas: Within normal limits. Spleen: Within normal limits. Adrenals/Urinary Tract: Adrenal glands are within normal limits. Multiple simple bilateral renal cysts, measuring up to 14.8 cm in the left upper kidney (series 3/image 68), benign (Bosniak I). No follow-up is recommended. 2.5 x 2.6 cm enhancing lesion in the anterior right upper kidney (series 3/image 70), grossly unchanged from prior MRI, compatible with solid renal neoplasm such as renal cell carcinoma. No hydronephrosis. Bladder is underdistended but unremarkable. Stomach/Bowel: Stomach is notable for a tiny hiatal hernia. No evidence of bowel obstruction. Normal appendix (series 3/image 98). Sigmoid diverticulosis, without evidence of diverticulitis. Vascular/Lymphatic: No evidence of  abdominal aortic aneurysm. Atherosclerotic calcifications of the abdominal aorta and branch vessels. No suspicious abdominopelvic lymphadenopathy. Reproductive: Prostate is unremarkable. Other: No abdominopelvic ascites. Musculoskeletal: Mild anterior wedging at L1, chronic. Degenerative changes of the lumbar spine. No focal osseous lesions. IMPRESSION: 2.6 cm enhancing lesion in the anterior right upper kidney, grossly unchanged from prior MRI, compatible with solid renal neoplasm such as renal cell carcinoma. No findings suspicious for metastatic disease. Cholelithiasis, without associated inflammatory changes. Additional ancillary findings as above. Electronically Signed   By: Julian Hy M.D.   On: 07/31/2022 02:13    PERFORMANCE STATUS (ECOG) : 1 - Symptomatic but completely ambulatory  Review of Systems  Constitutional:  Positive for appetite change and fatigue.  Unless otherwise noted, a complete review of systems is negative.  Physical Exam General: NAD Cardiovascular: regular rate and rhythm Pulmonary: clear ant fields Abdomen: soft, nontender, + bowel sounds Extremities: no edema, no joint deformities Skin: no rashes Neurological:AAO x4  IMPRESSION: This is my initial visit with Mr. Stivers. He was initially seen during his recent hospitalization by our Palliative team. Of note his granddaughter is also a provider on our team. No acute distress. Wife is present. Alert and able to engage appropriately in discussions.   I introduced myself, [place] RN, and Palliative's role in collaboration with the oncology team. Concept of Palliative Care was introduced as specialized medical care for people and their families living with serious illness.  It focuses on providing relief from the symptoms and stress of a serious illness.  The goal is to improve quality of life for both the patient and the family. Values and goals of care important to patient and family were attempted to be elicited.    Mr. Repass lives in the home with his wife of more than 55 years. 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 Christian faith. Enjoys spending time outside and working in his yard. He shares his hope of being able to care for his grape vines in the upcoming months and mow his yard on riding Conservation officer, nature.   At home he is able to perform all ADLs independently.  Does endorse having to take rest breaks due to ongoing fatigue since recent hospitalization.  Denies nausea, vomiting, pain.  Occasional discomfort which is managed with Tylenol.  Endorses occasional diarrhea.  Mr. Imran expresses concerns in his decrease in appetite.  Denies dysphagia.  States he does not have much of an appetite also endorsing early satiety.  Education provided on increasing protein as well as focusing on small frequent meals versus 3 large meals daily.  Patient and wife verbalized understanding and appreciation.  Overall patient is doing fairly well.  Is anxious to have future discussions with his oncologist and gain better understanding of next steps.   We discussed his current illness and what it means in the larger context of his on-going co-morbidities. Natural disease trajectory and expectations were discussed.  Mr. Twing and his wife are clear and expressed wishes to continue to treat the treatable allow him every opportunity to continue to thrive.  His quality of life means most to him.  He does have a documented advanced directive.  I discussed the importance of continued conversation with family and their medical providers regarding overall plan of care and treatment options, ensuring decisions are within the context of the patients values and GOCs.  PLAN: Established therapeutic relationship. Education provided on palliative's role in collaboration with their Oncology/Radiation team. No symptom management needs at this time. Education provided on increased protein and ways to  increase appetite. Ongoing goals of care discussions and support as needed. Patient and wife would like to maintain contact in the future for ongoing support. I will plan to see patient back in 6-8 weeks in collaboration to other oncology appointments.  He knows to contact our office if needed sooner.   Patient expressed understanding and was in agreement with this plan. He also understands that He can call the clinic at any time with any questions, concerns, or complaints.   Thank you for your referral and allowing Palliative to assist in Mr. Ashaan Palmese [facility] care.   Number and complexity of problems addressed: HIGH - 1 or more chronic illnesses with SEVERE exacerbation, progression, or side effects of treatment - advanced cancer, pain. Any controlled substances utilized were prescribed in the context of palliative care.  Time Total: 50 min   Visit consisted of counseling and education dealing with the complex and emotionally intense issues of symptom management and palliative care in the setting of serious and potentially life-threatening illness.Greater than 50%  of this time was spent counseling and coordinating care related to the above assessment and plan.  Signed by: Alda Lea, AGPCNP-BC Palliative Medicine Team/[place]

## 2022-08-22 NOTE — Assessment & Plan Note (Signed)
Hb went down to 8.9 with last hosp / this may have been dilutional  In setting of colon cancer s/p surgery  On both eliquis and plavix now  Was tx with iron inf at [place] today

## 2022-08-22 NOTE — Assessment & Plan Note (Signed)
Noted during last hospitalization for STEMI May have been related to that   Re check today  No new symptoms  Gallstones and perihepatic fluid were noted with last CT after colectomy

## 2022-08-23 ENCOUNTER — Other Ambulatory Visit: Payer: Self-pay | Admitting: *Deleted

## 2022-08-23 ENCOUNTER — Other Ambulatory Visit: Payer: Self-pay

## 2022-08-23 ENCOUNTER — Encounter: Payer: Self-pay | Admitting: Urology

## 2022-08-23 ENCOUNTER — Ambulatory Visit: Payer: Medicare HMO | Attending: General Practice | Admitting: General Practice

## 2022-08-23 ENCOUNTER — Inpatient Hospital Stay: Payer: Medicare HMO | Attending: Nurse Practitioner | Admitting: Nurse Practitioner

## 2022-08-23 ENCOUNTER — Encounter: Payer: Self-pay | Admitting: General Practice

## 2022-08-23 ENCOUNTER — Encounter: Payer: Self-pay | Admitting: Nurse Practitioner

## 2022-08-23 ENCOUNTER — Telehealth: Payer: Self-pay | Admitting: Family Medicine

## 2022-08-23 ENCOUNTER — Ambulatory Visit: Payer: Medicare HMO | Admitting: Urology

## 2022-08-23 VITALS — BP 128/70 | HR 71 | Ht 71.5 in | Wt 244.0 lb

## 2022-08-23 VITALS — BP 124/46 | HR 71 | Temp 98.2°F | Resp 17

## 2022-08-23 VITALS — BP 122/60 | HR 58 | Ht 71.0 in | Wt 240.4 lb

## 2022-08-23 DIAGNOSIS — I2111 ST elevation (STEMI) myocardial infarction involving right coronary artery: Secondary | ICD-10-CM

## 2022-08-23 DIAGNOSIS — E1122 Type 2 diabetes mellitus with diabetic chronic kidney disease: Secondary | ICD-10-CM

## 2022-08-23 DIAGNOSIS — J9811 Atelectasis: Secondary | ICD-10-CM

## 2022-08-23 DIAGNOSIS — C186 Malignant neoplasm of descending colon: Secondary | ICD-10-CM

## 2022-08-23 DIAGNOSIS — I2510 Atherosclerotic heart disease of native coronary artery without angina pectoris: Secondary | ICD-10-CM

## 2022-08-23 DIAGNOSIS — Z794 Long term (current) use of insulin: Secondary | ICD-10-CM | POA: Insufficient documentation

## 2022-08-23 DIAGNOSIS — K8020 Calculus of gallbladder without cholecystitis without obstruction: Secondary | ICD-10-CM

## 2022-08-23 DIAGNOSIS — G8929 Other chronic pain: Secondary | ICD-10-CM

## 2022-08-23 DIAGNOSIS — I5030 Unspecified diastolic (congestive) heart failure: Secondary | ICD-10-CM

## 2022-08-23 DIAGNOSIS — Z7984 Long term (current) use of oral hypoglycemic drugs: Secondary | ICD-10-CM | POA: Insufficient documentation

## 2022-08-23 DIAGNOSIS — I10 Essential (primary) hypertension: Secondary | ICD-10-CM | POA: Diagnosis not present

## 2022-08-23 DIAGNOSIS — G4733 Obstructive sleep apnea (adult) (pediatric): Secondary | ICD-10-CM

## 2022-08-23 DIAGNOSIS — R630 Anorexia: Secondary | ICD-10-CM

## 2022-08-23 DIAGNOSIS — N400 Enlarged prostate without lower urinary tract symptoms: Secondary | ICD-10-CM

## 2022-08-23 DIAGNOSIS — Z7901 Long term (current) use of anticoagulants: Secondary | ICD-10-CM | POA: Insufficient documentation

## 2022-08-23 DIAGNOSIS — E7800 Pure hypercholesterolemia, unspecified: Secondary | ICD-10-CM | POA: Diagnosis not present

## 2022-08-23 DIAGNOSIS — R3915 Urgency of urination: Secondary | ICD-10-CM

## 2022-08-23 DIAGNOSIS — K219 Gastro-esophageal reflux disease without esophagitis: Secondary | ICD-10-CM

## 2022-08-23 DIAGNOSIS — I129 Hypertensive chronic kidney disease with stage 1 through stage 4 chronic kidney disease, or unspecified chronic kidney disease: Secondary | ICD-10-CM | POA: Insufficient documentation

## 2022-08-23 DIAGNOSIS — R350 Frequency of micturition: Secondary | ICD-10-CM | POA: Diagnosis not present

## 2022-08-23 DIAGNOSIS — I5033 Acute on chronic diastolic (congestive) heart failure: Secondary | ICD-10-CM

## 2022-08-23 DIAGNOSIS — M542 Cervicalgia: Secondary | ICD-10-CM

## 2022-08-23 DIAGNOSIS — Z6831 Body mass index (BMI) 31.0-31.9, adult: Secondary | ICD-10-CM

## 2022-08-23 DIAGNOSIS — I4892 Unspecified atrial flutter: Secondary | ICD-10-CM

## 2022-08-23 DIAGNOSIS — N189 Chronic kidney disease, unspecified: Secondary | ICD-10-CM

## 2022-08-23 DIAGNOSIS — I4891 Unspecified atrial fibrillation: Secondary | ICD-10-CM | POA: Insufficient documentation

## 2022-08-23 DIAGNOSIS — I130 Hypertensive heart and chronic kidney disease with heart failure and stage 1 through stage 4 chronic kidney disease, or unspecified chronic kidney disease: Secondary | ICD-10-CM

## 2022-08-23 DIAGNOSIS — I21A1 Myocardial infarction type 2: Secondary | ICD-10-CM | POA: Diagnosis not present

## 2022-08-23 DIAGNOSIS — N2889 Other specified disorders of kidney and ureter: Secondary | ICD-10-CM

## 2022-08-23 DIAGNOSIS — Z515 Encounter for palliative care: Secondary | ICD-10-CM

## 2022-08-23 DIAGNOSIS — M419 Scoliosis, unspecified: Secondary | ICD-10-CM

## 2022-08-23 DIAGNOSIS — C182 Malignant neoplasm of ascending colon: Secondary | ICD-10-CM | POA: Diagnosis not present

## 2022-08-23 DIAGNOSIS — Z85828 Personal history of other malignant neoplasm of skin: Secondary | ICD-10-CM

## 2022-08-23 DIAGNOSIS — Z7189 Other specified counseling: Secondary | ICD-10-CM

## 2022-08-23 DIAGNOSIS — I082 Rheumatic disorders of both aortic and tricuspid valves: Secondary | ICD-10-CM

## 2022-08-23 DIAGNOSIS — D123 Benign neoplasm of transverse colon: Secondary | ICD-10-CM

## 2022-08-23 DIAGNOSIS — I252 Old myocardial infarction: Secondary | ICD-10-CM | POA: Insufficient documentation

## 2022-08-23 DIAGNOSIS — R399 Unspecified symptoms and signs involving the genitourinary system: Secondary | ICD-10-CM

## 2022-08-23 DIAGNOSIS — Z79899 Other long term (current) drug therapy: Secondary | ICD-10-CM | POA: Insufficient documentation

## 2022-08-23 DIAGNOSIS — Z9981 Dependence on supplemental oxygen: Secondary | ICD-10-CM

## 2022-08-23 DIAGNOSIS — D127 Benign neoplasm of rectosigmoid junction: Secondary | ICD-10-CM

## 2022-08-23 DIAGNOSIS — Z9181 History of falling: Secondary | ICD-10-CM

## 2022-08-23 DIAGNOSIS — Z483 Aftercare following surgery for neoplasm: Secondary | ICD-10-CM | POA: Diagnosis not present

## 2022-08-23 DIAGNOSIS — Z7902 Long term (current) use of antithrombotics/antiplatelets: Secondary | ICD-10-CM | POA: Insufficient documentation

## 2022-08-23 DIAGNOSIS — D631 Anemia in chronic kidney disease: Secondary | ICD-10-CM

## 2022-08-23 DIAGNOSIS — Z9049 Acquired absence of other specified parts of digestive tract: Secondary | ICD-10-CM

## 2022-08-23 DIAGNOSIS — E669 Obesity, unspecified: Secondary | ICD-10-CM

## 2022-08-23 DIAGNOSIS — D630 Anemia in neoplastic disease: Secondary | ICD-10-CM

## 2022-08-23 DIAGNOSIS — I480 Paroxysmal atrial fibrillation: Secondary | ICD-10-CM

## 2022-08-23 DIAGNOSIS — D62 Acute posthemorrhagic anemia: Secondary | ICD-10-CM

## 2022-08-23 DIAGNOSIS — N179 Acute kidney failure, unspecified: Secondary | ICD-10-CM

## 2022-08-23 DIAGNOSIS — E785 Hyperlipidemia, unspecified: Secondary | ICD-10-CM

## 2022-08-23 DIAGNOSIS — M1990 Unspecified osteoarthritis, unspecified site: Secondary | ICD-10-CM

## 2022-08-23 LAB — CBC WITH DIFFERENTIAL/PLATELET
Basophils Absolute: 0 10*3/uL (ref 0.0–0.1)
Basophils Relative: 0.5 % (ref 0.0–3.0)
Eosinophils Absolute: 0.3 10*3/uL (ref 0.0–0.7)
Eosinophils Relative: 3.8 % (ref 0.0–5.0)
HCT: 32.1 % — ABNORMAL LOW (ref 39.0–52.0)
Hemoglobin: 10.1 g/dL — ABNORMAL LOW (ref 13.0–17.0)
Lymphocytes Relative: 16.9 % (ref 12.0–46.0)
Lymphs Abs: 1.4 10*3/uL (ref 0.7–4.0)
MCHC: 31.4 g/dL (ref 30.0–36.0)
MCV: 81.7 fl (ref 78.0–100.0)
Monocytes Absolute: 1 10*3/uL (ref 0.1–1.0)
Monocytes Relative: 11.3 % (ref 3.0–12.0)
Neutro Abs: 5.7 10*3/uL (ref 1.4–7.7)
Neutrophils Relative %: 67.5 % (ref 43.0–77.0)
Platelets: 415 10*3/uL — ABNORMAL HIGH (ref 150.0–400.0)
RBC: 3.93 Mil/uL — ABNORMAL LOW (ref 4.22–5.81)
RDW: 20.8 % — ABNORMAL HIGH (ref 11.5–15.5)
WBC: 8.5 10*3/uL (ref 4.0–10.5)

## 2022-08-23 LAB — BASIC METABOLIC PANEL
BUN: 17 mg/dL (ref 6–23)
CO2: 26 mEq/L (ref 19–32)
Calcium: 8.6 mg/dL (ref 8.4–10.5)
Chloride: 105 mEq/L (ref 96–112)
Creatinine, Ser: 1.26 mg/dL (ref 0.40–1.50)
GFR: 51.65 mL/min — ABNORMAL LOW (ref >60.00)
Glucose, Bld: 161 mg/dL — ABNORMAL HIGH (ref 70–99)
Potassium: 4.1 mEq/L (ref 3.5–5.1)
Sodium: 140 mEq/L (ref 135–145)

## 2022-08-23 LAB — HEPATIC FUNCTION PANEL
ALT: 45 U/L (ref 0–53)
AST: 24 U/L (ref 0–37)
Albumin: 3.5 g/dL (ref 3.5–5.2)
Alkaline Phosphatase: 132 U/L — ABNORMAL HIGH (ref 39–117)
Bilirubin, Direct: 0 mg/dL (ref 0.0–0.3)
Total Bilirubin: 0.4 mg/dL (ref 0.2–1.2)
Total Protein: 6.9 g/dL (ref 6.0–8.3)

## 2022-08-23 NOTE — Progress Notes (Signed)
   08/23/2022 2:13 PM   Ryan Garcia [birth year] [id number]  Reason for visit: Follow up right renal mass, urinary symptoms  HPI: Comorbid 89 year old male with extensive cardiac history found to have a 2.6 cm right-sided enhancing upper pole renal mass concerning for RCC in 2021.  Renal function is stable with recent creatinine 1.26, EGFR 52.  He has been on active surveillance.    He has had multiple hospitalizations recently for cardiac issues and new diagnosis of colon cancer requiring resection.  I personally viewed and interpreted the CT abdomen and pelvis with contrast dated 07/31/2022 showing stable right upper pole 2.6 cm mass, unchanged from prior MRI.  He would like to continue active surveillance which is very reasonable with his stable lesion and numerous other comorbidities and multiple recent hospitalizations.  He has some mild urinary symptoms of urgency and frequency, we discussed behavioral strategies, would avoid any anticholinergics with his age and frailty, as well as avoid polypharmacy.  RTC 1 year with imaging prior, anticipate will be getting ongoing CT imaging through oncology for new diagnosis of colon cancer   [name], MD  [place] [address], [place], [place] [address] [phone]

## 2022-08-23 NOTE — Telephone Encounter (Signed)
Please ok those verbal orders Aware of the plavix and eliquis together and the risks (this is from cardiology)

## 2022-08-23 NOTE — Progress Notes (Signed)
Cardiology Clinic Note   Patient Name: BRAEDYN PETREA Date of Encounter: 08/23/2022  Primary Care Provider:  Abner Greenspan, MD Primary Cardiologist:  Kirk Ruths, MD  Patient Profile    NASHTON KWASNIEWSKI 89 year old male presents the clinic today for follow-up evaluation of his coronary artery disease status post STEMI involving his right coronary artery 08/17/2022  Past Medical History    Past Medical History:  Diagnosis Date   AC (acromioclavicular) joint bone spurs    lt ankle   Allergy    allergic rhinitis   Anginal pain ([place])    Arthritis    OA   BPH (benign prostatic hyperplasia)    microwave tx of prostate   CAD (coronary artery disease)    a. s/p cath in 2012 showing 70-75% LAD stenosis, 75% D1 stenosis, 75% OM1, 60-70% RCA stenosis, and 80% PDA --> medical therapy pursued b. similar findings by cath in 2016; 06/2017 DES to distal RCA   Chronic kidney disease    Chronic upper back pain    Diabetes mellitus    type II x8 years   GERD (gastroesophageal reflux disease)    HLD (hyperlipidemia)    10/12: TC 208, TG 213, HDL 36, LDL 129   Hx of skin cancer, basal cell    many skin cancers removed/under constant treatment.   Hypertension    NSTEMI (non-ST elevated myocardial infarction) ([place]) 06/18/2017   DES to distal RCA   Obstructive sleep apnea on CPAP    Paroxysmal atrial flutter ([place])    Renal mass    Scoliosis    Past Surgical History:  Procedure Laterality Date   BIOPSY  08/01/2022   Procedure: BIOPSY;  Surgeon: Jerene Bears, MD;  Location: [facility] ENDOSCOPY;  Service: Gastroenterology;;   BREAST SURGERY  2009   breast lump removed benign   BUBBLE STUDY  10/14/2020   Procedure: BUBBLE STUDY;  Surgeon: [name], MD;  Location: [place];  Service: Cardiovascular;;   CARDIAC CATHETERIZATION N/A 03/15/2015   Procedure: Left Heart Cath and Coronary Angiography;  Surgeon: Belva Crome, MD;  Location: [place] CV LAB;  Service: Cardiovascular;   Laterality: N/A;   CARDIOVERSION N/A 10/14/2020   Procedure: CARDIOVERSION;  Surgeon: [name], MD;  Location: [facility] ENDOSCOPY;  Service: Cardiovascular;  Laterality: N/A;   CATARACT EXTRACTION W/PHACO Right 11/29/2021   Procedure: CATARACT EXTRACTION PHACO AND INTRAOCULAR LENS PLACEMENT ([place]) RIGHT DIABETIC;  Surgeon: Leandrew Koyanagi, MD;  Location: [place];  Service: Ophthalmology;  Laterality: Right;  Diabetic 18.72 01:54.3   COLONOSCOPY N/A 08/01/2022   Procedure: COLONOSCOPY;  Surgeon: Jerene Bears, MD;  Location: [place];  Service: Gastroenterology;  Laterality: N/A;   CORONARY STENT INTERVENTION N/A 06/18/2017   Procedure: CORONARY STENT INTERVENTION;  Surgeon: Jettie Booze, MD;  Location: [place] CV LAB;  Service: Cardiovascular;  Laterality: N/A;  RCA   CORONARY/GRAFT ACUTE MI REVASCULARIZATION N/A 08/17/2022   Procedure: Coronary/Graft Acute MI Revascularization;  Surgeon: Yolonda Kida, MD;  Location: [place] CV LAB;  Service: Cardiovascular;  Laterality: N/A;   ESOPHAGOGASTRODUODENOSCOPY (EGD) WITH PROPOFOL N/A 08/01/2022   Procedure: ESOPHAGOGASTRODUODENOSCOPY (EGD) WITH PROPOFOL;  Surgeon: Jerene Bears, MD;  Location: [facility] ENDOSCOPY;  Service: Gastroenterology;  Laterality: N/A;   HEMOSTASIS CLIP PLACEMENT  08/01/2022   Procedure: HEMOSTASIS CLIP PLACEMENT;  Surgeon: Jerene Bears, MD;  Location: [facility] ENDOSCOPY;  Service: Gastroenterology;;   LAPAROSCOPIC LYSIS OF ADHESIONS N/A 08/03/2022   Procedure: LAPAROSCOPIC  LYSIS OF ADHESIONS;  Surgeon: Jovita Kussmaul, MD;  Location: [place];  Service: General;  Laterality: N/A;   LAPAROSCOPIC PARTIAL COLECTOMY N/A 08/03/2022   Procedure: OPEN PARTIAL COLECTOMY;  Surgeon: Jovita Kussmaul, MD;  Location: [place];  Service: General;  Laterality: N/A;   LEFT HEART CATH AND CORONARY ANGIOGRAPHY N/A 06/18/2017   Procedure: LEFT HEART CATH AND CORONARY ANGIOGRAPHY;  Surgeon: Jettie Booze, MD;  Location:  [place] CV LAB;  Service: Cardiovascular;  Laterality: N/A;   LEFT HEART CATH AND CORONARY ANGIOGRAPHY N/A 04/12/2020   Procedure: LEFT HEART CATH AND CORONARY ANGIOGRAPHY;  Surgeon: Martinique, Peter M, MD;  Location: [place] CV LAB;  Service: Cardiovascular;  Laterality: N/A;   LEFT HEART CATH AND CORONARY ANGIOGRAPHY N/A 08/17/2022   Procedure: LEFT HEART CATH AND CORONARY ANGIOGRAPHY;  Surgeon: Yolonda Kida, MD;  Location: [place] CV LAB;  Service: Cardiovascular;  Laterality: N/A;   POLYPECTOMY  08/01/2022   Procedure: POLYPECTOMY;  Surgeon: Jerene Bears, MD;  Location: [place];  Service: Gastroenterology;;   SUBMUCOSAL TATTOO INJECTION  08/01/2022   Procedure: SUBMUCOSAL TATTOO INJECTION;  Surgeon: Jerene Bears, MD;  Location: [place];  Service: Gastroenterology;;   TEE WITHOUT CARDIOVERSION N/A 10/14/2020   Procedure: TRANSESOPHAGEAL ECHOCARDIOGRAM (TEE);  Surgeon: [name], MD;  Location: [place];  Service: Cardiovascular;  Laterality: N/A;   ULTRASOUND GUIDANCE FOR VASCULAR ACCESS  06/18/2017   Procedure: Ultrasound Guidance For Vascular Access;  Surgeon: Jettie Booze, MD;  Location: [place] CV LAB;  Service: Cardiovascular;;  right radial, right femoral    Allergies  Allergies  Allergen Reactions   Loratadine Other (See Comments)    Not effective   Simvastatin Other (See Comments)    Intolerant- caused joint pain    History of Present Illness    NORVAL LARICK has a PMH of paroxysmal atrial flutter on apixaban, coronary artery disease, adenocarcinoma of the colon status post surgical resection 2/24, iron deficiency anemia, HFpEF, right renal mass, HTN, HLD, OSA on CPAP.  He has had several cardiac catheterizations.  He was admitted 12/18 with NSTEMI.  Cardiac catheterization at that time showed severe three-vessel CAD with left-sided disease which was unchanged compared to prior catheterization and showed new RCA lesion.  He  underwent PCI with DES.  He had repeat cardiac catheterization 2021 which showed multivessel coronary artery disease and patent RCA stent.  Medical management was recommended with consideration for CABG if he had refractory angina.  His echocardiogram in 2022 showed normal LVEF and basal inferior hypokinesis with severe LVH.  He underwent successful TEE and DCCV 4/22.  He was admitted 1/[facility] at [facility] with chest discomfort which was associated with exertion.  He was noted to be anemic with a hemoglobin of 8.7.  His apixaban was held.  His high-sensitivity troponins peaked at 651.  Cardiology was consulted.  His echocardiogram showed EF 60-65% with no regional wall motion abnormalities and mildly elevated PASP.  It was felt that his elevated troponin was due to demand ischemia in the setting of acute anemia.  A chest CT, abdomen, pelvis showed 2.5 x 2.6 cm lesion (renal cell carcinoma) in the anterior right upper kidney which was unchanged from previous MRI.  He underwent EGD and colonoscopy.  Colonoscopy showed malignant tumor in his mid descending colon.  He underwent lap colectomy on 08/03/2022.  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  His apixaban was resumed prior to discharge.  He presented to for evaluation with his PCP on 08/17/2022.  After his visit approximately 1 hour later he developed acute substernal chest pressure which was associated with dyspnea.  EMS was contacted and code STEMI was activated.  His EKG showed inferior ST elevation.  He went emergently to the cardiac Cath Lab.  Cardiac catheterization showed a large RCA with total occlusion from mid to distal vessel.  His LAD was noted to have diffuse moderate-high-grade stenosis.  Circumflex was noted to have diffuse moderate-high-grade stenosis.  He underwent PCI with DES to his mid-distal RCA and was noted to have a 0% residual stenosis.  Post catheterization he was admitted to the ICU and remained  hemodynamically stable.  His hemoglobin remained stable.  He remained in sinus rhythm with frequent PACs and occasional PVCs.  He was discharged in stable condition on 08/19/2022.  He presents to the clinic today for follow-up evaluation and states he "feels ok considering". We reviewed his recent hospitalizations and cardiac cath. He ahd his wife expressed understanding. We reviewed the importance of heart health diet and continued cholesterol mngt. He reported compliance with his medications and recent labs were drawn at his pcp.  Bp 122/60. I will plan follow up in 3-4 months.  Today he denies chest pain, shortness of breath, lower extremity edema, fatigue, palpitations, melena, hematuria, hemoptysis, diaphoresis, weakness, presyncope, syncope, orthopnea, and PND.   Home Medications    Prior to Admission medications   Medication Sig Start Date End Date Taking? Authorizing Provider  acetaminophen (TYLENOL) 325 MG tablet Take 325-650 mg by mouth every 6 (six) hours as needed for mild pain or headache.    [provider]  Alcohol Swabs (B-D SINGLE USE SWABS REGULAR) PADS Use to check blood sugar 2 times a day 03/30/21   Tower, Wynelle Fanny, MD  apixaban (ELIQUIS) 5 MG TABS tablet Take 1 tablet (5 mg total) by mouth 2 (two) times daily. 11/22/21   Lelon Perla, MD  Blood Glucose Calibration (TRUE METRIX LEVEL 1) Low SOLN Use to check control on glucose meter 03/30/21   Tower, Wynelle Fanny, MD  Blood Glucose Monitoring Suppl (TRUE METRIX AIR GLUCOSE METER) w/Device KIT 1 kit by Other route 2 (two) times daily. Check blood sugar twice daily and as directed. Dx E11.65 03/30/21   Tower, Wynelle Fanny, MD  clopidogrel (PLAVIX) 75 MG tablet Take 1 tablet (75 mg total) by mouth daily. 08/20/22 09/19/22  Max Sane, MD  Coenzyme Q10 (CO Q 10 PO) Take 200 mg by mouth daily.    [provider]  Cyanocobalamin (VITAMIN B12) 1000 MCG TBCR Take 1,000 mcg by mouth daily. 04/12/20   Cheryln Manly, NP   DROPLET PEN NEEDLES 31G X 6 MM MISC USE ONE TIME DAILY  AT  BEDTIME  WITH  LANTUS 01/01/22   Tower, Wynelle Fanny, MD  fluticasone (FLONASE) 50 MCG/ACT nasal spray Place 1 spray into both nostrils daily as needed for allergies.     [provider]  furosemide (LASIX) 20 MG tablet Take 20 mg by mouth daily. 07/18/22   [provider]  glipiZIDE (GLUCOTROL) 10 MG tablet TAKE 1 TABLET TWICE DAILY WITH MEALS 08/16/22   Tower, [place] A, MD  insulin glargine (LANTUS SOLOSTAR) 100 UNIT/ML Solostar Pen Inject 50 Units into the skin at bedtime. Patient taking differently: Inject 51 Units into the skin at bedtime. 10/11/21   Tower, Wynelle Fanny, MD  isosorbide mononitrate (IMDUR) 60 MG 24 hr tablet TAKE 1 TABLET  EVERY DAY Patient taking differently: Take 60 mg by mouth daily. 02/05/22   Lelon Perla, MD  metFORMIN (GLUCOPHAGE) 1000 MG tablet TAKE 1 TABLET TWICE DAILY WITH MEALS Patient taking differently: Take 1,000 mg by mouth 2 (two) times daily. 10/25/21   Tower, Wynelle Fanny, MD  metoprolol tartrate (LOPRESSOR) 25 MG tablet Take 0.5 tablets (12.5 mg total) by mouth 2 (two) times daily. 12/12/21   Lelon Perla, MD  nitroGLYCERIN (NITROSTAT) 0.4 MG SL tablet Place 1 tablet (0.4 mg total) under the tongue every 5 (five) minutes as needed for chest pain. 05/11/19   [facility], Tami Lin, PA  NOVOLOG FLEXPEN 100 UNIT/ML FlexPen Inject 16 Units into the skin daily. Before dinner 09/14/21   [provider]  pantoprazole (PROTONIX) 40 MG tablet Take 1 tablet (40 mg total) by mouth daily at 6 (six) AM. 08/12/22 10/11/22  Elodia Florence., MD  rosuvastatin (CRESTOR) 40 MG tablet Take 1 tablet (40 mg total) by mouth daily. 01/11/22 07/31/22  Lelon Perla, MD  TRUE METRIX BLOOD GLUCOSE TEST test strip CHECK BLOOD SUGAR TWICE DAILY 01/26/22   Tower, Wynelle Fanny, MD  TRUEplus Lancets 30G MISC Check blood sugar twice daily and as directed. Dx E11.65 03/30/21   Abner Greenspan, MD    Family History    Family  History  Problem Relation Age of Onset   Heart attack Brother    Colon cancer Neg Hx    Colon polyps Neg Hx    Stomach cancer Neg Hx    Esophageal cancer Neg Hx    He indicated that his mother is deceased. He indicated that his father is deceased. He indicated that one of his two brothers is deceased. He indicated that his maternal grandmother is deceased. He indicated that his maternal grandfather is deceased. He indicated that his paternal grandmother is deceased. He indicated that his paternal grandfather is deceased. He indicated that the status of his neg hx is unknown.  Social History    Social History   Socioeconomic History   Marital status: Married    Spouse name: Procopio Rovner   Number of children: 3   Years of education: Not on file   Highest education level: Not on file  Occupational History   Not on file  Tobacco Use   Smoking status: Never    Passive exposure: Never   Smokeless tobacco: Never  Vaping Use   Vaping Use: Never used  Substance and Sexual Activity   Alcohol use: No    Alcohol/week: 0.0 standard drinks of alcohol   Drug use: No   Sexual activity: Not Currently  Other Topics Concern   Not on file  Social History Narrative   Married    Physically active hard of hearing   Social Determinants of Health   Financial Resource Strain: Low Risk  (06/29/2021)   Overall Financial Resource Strain (CARDIA)    Difficulty of Paying Living Expenses: Not hard at all  Food Insecurity: No Food Insecurity (08/20/2022)   Hunger Vital Sign    Worried About Running Out of Food in the Last Year: Never true    Ran Out of Food in the Last Year: Never true  Transportation Needs: No Transportation Needs (08/20/2022)   PRAPARE - Hydrologist (Medical): No    Lack of Transportation (Non-Medical): No  Physical Activity: Inactive (06/29/2021)   Exercise Vital Sign    Days of Exercise per Week: 0 days  Minutes of Exercise per Session: 0 min   Stress: No Stress Concern Present (06/29/2021)   [place]    Feeling of Stress : Not at all  Social Connections: Moderately Integrated (06/29/2021)   Social Connection and Isolation Panel [NHANES]    Frequency of Communication with Friends and Family: More than three times a week    Frequency of Social Gatherings with Friends and Family: More than three times a week    Attends Religious Services: More than 4 times per year    Active Member of [employer] or Organizations: No    Attends Archivist Meetings: Never    Marital Status: Married  Human resources officer Violence: Not At Risk (08/18/2022)   Humiliation, Afraid, Rape, and Kick questionnaire    Fear of Current or Ex-Partner: No    Emotionally Abused: No    Physically Abused: No    Sexually Abused: No     Review of Systems    General:  No chills, fever, night sweats or weight changes.  Cardiovascular:  No chest pain, dyspnea on exertion, edema, orthopnea, palpitations, paroxysmal nocturnal dyspnea. Dermatological: No rash, lesions/masses Respiratory: No cough, dyspnea Urologic: No hematuria, dysuria Abdominal:   No nausea, vomiting, diarrhea, bright red blood per rectum, melena, or hematemesis Neurologic:  No visual changes, wkns, changes in mental status. All other systems reviewed and are otherwise negative except as noted above.  Physical Exam    VS:  BP 122/60 (BP Location: Left Arm, Patient Position: Sitting, Cuff Size: Large)   Pulse (!) 58   Ht 5' 11"$  (1.803 m)   Wt 240 lb 6.4 oz (109 kg)   SpO2 96%   BMI 33.53 kg/m  , BMI Body mass index is 33.53 kg/m. GEN: Well nourished, well developed, in no acute distress. HEENT: normal. Neck: Supple, no JVD, carotid bruits, or masses. Cardiac: RRR, no murmurs, rubs, or gallops. No clubbing, cyanosis, edema.  Radials/DP/PT 2+ and equal bilaterally.  Respiratory:  Respirations regular and unlabored, clear to  auscultation bilaterally. GI: Soft, nontender, nondistended, BS + x 4. MS: no deformity or atrophy. Skin: warm and dry, no rash. Neuro:  Strength and sensation are intact. Psych: Normal affect.  Accessory Clinical Findings    Recent Labs: 08/08/2022: B Natriuretic Peptide 292.0 08/11/2022: Magnesium 2.2 08/17/2022: ALT 112; TSH 7.730 08/18/2022: BUN 15; Creatinine, Ser 1.12; Hemoglobin 8.9; Platelets 342; Potassium 4.1; Sodium 140   Recent Lipid Panel    Component Value Date/Time   CHOL 86 07/31/2022 0447   CHOL 126 09/15/2019 0810   TRIG 95 07/31/2022 0447   HDL 29 (L) 07/31/2022 0447   HDL 33 (L) 09/15/2019 0810   CHOLHDL 3.0 07/31/2022 0447   VLDL 19 07/31/2022 0447   LDLCALC 38 07/31/2022 0447   LDLCALC 59 09/15/2019 0810   LDLDIRECT 78.5 11/17/2012 0936         ECG personally reviewed by me today- sinus rhythm with marked sinus arrhythmia with first-degree AV block with junctional escape complexes 61 bpm - No acute changes  Echocardiogram 08/18/2022 IMPRESSIONS     1. Left ventricular ejection fraction, by estimation, is 60 to 65%. The  left ventricle has normal function. The left ventricle has no regional  wall motion abnormalities. There is mild left ventricular hypertrophy.  Left ventricular diastolic parameters  were normal.   2. Right ventricular systolic function is normal. The right ventricular  size is normal. There is normal pulmonary artery systolic pressure.  3. The mitral valve is normal in structure. Trivial mitral valve  regurgitation.   4. The aortic valve is tricuspid. Aortic valve regurgitation is not  visualized.   5. Aortic dilatation noted. There is mild dilatation of the ascending  aorta, measuring 39 mm.   6. The inferior vena cava is normal in size with greater than 50%  respiratory variability, suggesting right atrial pressure of 3 mmHg.   FINDINGS   Left Ventricle: Left ventricular ejection fraction, by estimation, is 60  to 65%. The  left ventricle has normal function. The left ventricle has no  regional wall motion abnormalities. The left ventricular internal cavity  size was normal in size. There is   mild left ventricular hypertrophy. Left ventricular diastolic parameters  were normal.   Right Ventricle: The right ventricular size is normal. No increase in  right ventricular wall thickness. Right ventricular systolic function is  normal. There is normal pulmonary artery systolic pressure. The tricuspid  regurgitant velocity is 2.58 m/s, and   with an assumed right atrial pressure of 3 mmHg, the estimated right  ventricular systolic pressure is 0000000 mmHg.   Left Atrium: Left atrial size was normal in size.   Right Atrium: Right atrial size was normal in size.   Pericardium: There is no evidence of pericardial effusion.   Mitral Valve: The mitral valve is normal in structure. Trivial mitral  valve regurgitation. MV peak gradient, 4.5 mmHg. The mean mitral valve  gradient is 3.0 mmHg.   Tricuspid Valve: The tricuspid valve is normal in structure. Tricuspid  valve regurgitation is not demonstrated.   Aortic Valve: The aortic valve is tricuspid. Aortic valve regurgitation is  not visualized. Aortic valve mean gradient measures 6.0 mmHg. Aortic valve  peak gradient measures 9.7 mmHg. Aortic valve area, by VTI measures 2.92  cm.   Pulmonic Valve: The pulmonic valve was not well visualized. Pulmonic valve  regurgitation is not visualized.   Aorta: The aortic root is normal in size and structure and aortic  dilatation noted. There is mild dilatation of the ascending aorta,  measuring 39 mm.   Venous: The inferior vena cava is normal in size with greater than 50%  respiratory variability, suggesting right atrial pressure of 3 mmHg.   IAS/Shunts: No atrial level shunt detected by color flow Doppler.    Cardiac catheterization 08/17/2022    Prox LAD lesion is 85% stenosed.   Dist LAD lesion is 65%  stenosed.   Prox LAD to Mid LAD lesion is 75% stenosed.   Ost Cx to Mid Cx lesion is 80% stenosed.   Mid Cx to Dist Cx lesion is 65% stenosed.   Prox RCA to Mid RCA lesion is 30% stenosed.   Ost 1st Diag to 1st Diag lesion is 85% stenosed.   Ost 1st Mrg to 1st Mrg lesion is 95% stenosed.   2nd Mrg lesion is 75% stenosed.   RPDA lesion is 60% stenosed.   1st RPL lesion is 70% stenosed.   Mid RCA to Dist RCA lesion is 100% stenosed.   Non-stenotic Dist RCA lesion was previously treated.   A drug-eluting stent was successfully placed using a STENT ONYX FRONTIER [id number].   Post intervention, there is a 0% residual stenosis.   The left ventricular systolic function is normal.   LV end diastolic pressure is mildly elevated.   There is mild (2+) mitral regurgitation.   STEMI presentation inferior MI Recent abdominal surgery for colon cancer as well as underlying atrial  fibrillation on Eliquis Patient presented with acute onset of midsternal chest pain initial EKG by EMS suggested inferior STEMI Patient brought to the emergency room hemodynamically stable given heparin aspirin Brilinta in the emergency room Brought to the cardiac Cath Lab Right groin approach Left ventriculogram showed preserved left ventricular function mild inferior hypo-EF around 50 to 55% mild left ventricular enlargement Coronaries Left main large with minor irregularities LAD large with diffuse moderate to severe disease which is known but appeared to be stable TIMI-3 flow Circumflex large with moderate to severe diffuse disease as well TIMI-3 flow previously known RCA large with the distal 100% occlusion IRA TIMI 0 flow lesion No significant collaterals   Dimension Successful PCI and stent of distal RCA with a 3.0 x 34 mm DES stent Postdilated with a 3.0 x 20 mm [place] balloon up to 16 atm TIMI-3 flow was restored from TIMI 0 Lesion reduced from 100% down to 0 Patient tolerated procedure well Mynx deployed to the  right femoral artery No complications Patient then transferred to ICU for recovery Cardiology management will be transferred back to [facility] in the morning he is a patient of Dr. Stanford Breed   Diagnostic Dominance: Right  Intervention       Assessment & Plan   1.  STEMI-no chest pain today.  Underwent emergent cardiac catheterization with PCI and DES to his mid-distal RCA.  Details above.  Cath site healing well with no signs of infection. Continue rosuvastatin, Imdur, Plavix, metoprolol Heart healthy low-sodium diet-salty 6 given Request PCP labs  Hyperlipidemia-LDL 38 on 07/31/2022.  LP(a) 57.5 on 08/18/2022 Heart healthy low-sodium high-fiber diet Continue rosuvastatin Increase physical activity as tolerated  Paroxysmal atrial flutter-EKG today shows sinus rhythm with marked sinus arrhythmia with first-degree AV block with junctional escape complexes 61 bpm.  Reports compliance with apixaban.  Denies bleeding issues. Continue apixaban, metoprolol Heart healthy low-sodium diet-salty 6 given Increase physical activity as tolerated  HFpEF-NYHA class I-2.  Euvolemic.  Weight stable.  Echocardiogram 08/18/2022 showed EF of 60-65%, trivial mitral valve regurgitation and mild left ventricular hypertrophy. Continue Imdur, metoprolol, furosemide Heart healthy low-sodium diet Daily weights  Ascending colon neoplasm, right renal mass-status post laparoscopic colectomy 08/03/22.  Renal mass concerning for cancer Follows with oncology  Disposition: Follow-up with Dr. Stanford Breed in 3-4 months.   Jossie Ng. Norval Slaven NP-C     08/23/2022, 8:06 AM [place] [address] Office [phone] Fax [phone]    I spent 14 minutes examining this patient, reviewing medications, and using patient centered shared decision making involving her cardiac care.  Prior to her visit I spent greater than 20 minutes reviewing her past medical history,  medications, and  prior cardiac tests.

## 2022-08-23 NOTE — Telephone Encounter (Signed)
[place] Name: [place] Agency Name: [name] [place] Phone #: [phone], Secure  Service Requested: PT (examples: OT/PT/Skilled Nursing/Social Work/Speech Therapy/Wound Care)  Frequency of Visits: 1 time for 4 weeks  When she was reconciling his medications their was a lvl 2 medication interaction warning between plavix and eliquis

## 2022-08-23 NOTE — Patient Instructions (Signed)
Medication Instructions:  The current medical regimen is effective;  continue present plan and medications as directed. Please refer to the Current Medication list given to you today.  *If you need a refill on your cardiac medications before your next appointment, please call your pharmacy*  Lab Work: [place] PRIMARY MD If you have labs (blood work) drawn today and your tests are completely normal, you will receive your results only by: [place] (if you have MyChart) OR A paper copy in the mail If you have any lab test that is abnormal or we need to change your treatment, we will call you to review the results.  Testing/Procedures: NONE  Follow-Up: At [facility], you and your health needs are our priority.  As part of our continuing mission to provide you with exceptional heart care, we have created designated Provider Care Teams.  These Care Teams include your primary Cardiologist (physician) and Advanced Practice Providers (APPs -  Physician Assistants and Nurse Practitioners) who all work together to provide you with the care you need, when you need it.  Your next appointment:   3-4 month(s)  Provider:   Kirk Ruths, MD     Other Instructions INCREASE PHYSICAL ACTIVITY AS TOLERATED  PLEASE READ AND FOLLOW ATTACHED  SALTY 6

## 2022-08-23 NOTE — Telephone Encounter (Signed)
VO given and Amy notified of Dr. Marliss Coots comments regarding the med interactions

## 2022-08-24 ENCOUNTER — Telehealth: Payer: Self-pay | Admitting: *Deleted

## 2022-08-24 ENCOUNTER — Encounter: Payer: Self-pay | Admitting: *Deleted

## 2022-08-24 ENCOUNTER — Inpatient Hospital Stay: Payer: Medicare HMO

## 2022-08-24 ENCOUNTER — Inpatient Hospital Stay: Payer: Medicare HMO | Admitting: Oncology

## 2022-08-24 VITALS — BP 133/61 | HR 63 | Temp 98.2°F | Resp 18 | Ht 71.0 in | Wt 239.2 lb

## 2022-08-24 DIAGNOSIS — Z7902 Long term (current) use of antithrombotics/antiplatelets: Secondary | ICD-10-CM | POA: Diagnosis not present

## 2022-08-24 DIAGNOSIS — N189 Chronic kidney disease, unspecified: Secondary | ICD-10-CM | POA: Diagnosis not present

## 2022-08-24 DIAGNOSIS — I129 Hypertensive chronic kidney disease with stage 1 through stage 4 chronic kidney disease, or unspecified chronic kidney disease: Secondary | ICD-10-CM | POA: Diagnosis not present

## 2022-08-24 DIAGNOSIS — Z794 Long term (current) use of insulin: Secondary | ICD-10-CM | POA: Diagnosis not present

## 2022-08-24 DIAGNOSIS — C182 Malignant neoplasm of ascending colon: Secondary | ICD-10-CM | POA: Diagnosis not present

## 2022-08-24 DIAGNOSIS — E1122 Type 2 diabetes mellitus with diabetic chronic kidney disease: Secondary | ICD-10-CM | POA: Diagnosis not present

## 2022-08-24 DIAGNOSIS — Z7984 Long term (current) use of oral hypoglycemic drugs: Secondary | ICD-10-CM | POA: Diagnosis not present

## 2022-08-24 DIAGNOSIS — I4891 Unspecified atrial fibrillation: Secondary | ICD-10-CM | POA: Diagnosis not present

## 2022-08-24 DIAGNOSIS — Z79899 Other long term (current) drug therapy: Secondary | ICD-10-CM | POA: Diagnosis not present

## 2022-08-24 DIAGNOSIS — Z85828 Personal history of other malignant neoplasm of skin: Secondary | ICD-10-CM | POA: Diagnosis not present

## 2022-08-24 DIAGNOSIS — I252 Old myocardial infarction: Secondary | ICD-10-CM | POA: Diagnosis not present

## 2022-08-24 DIAGNOSIS — Z7901 Long term (current) use of anticoagulants: Secondary | ICD-10-CM | POA: Diagnosis not present

## 2022-08-24 LAB — CEA (ACCESS): CEA (CHCC): 2.38 ng/mL (ref 0.00–5.00)

## 2022-08-24 NOTE — Progress Notes (Signed)
PATIENT NAVIGATOR PROGRESS NOTE  Name: Ryan Garcia Date: 08/24/2022 MRN: [id number]  DOB: Oct 03, 1933   Reason for visit:  Initial visit with Dr Benay Spice  Comments:  Met with [name] during appt with Dr Benay Spice Will have CEA drawn today and will call with results Will return to clinic in 4 months for F/U with Dr Benay Spice Given contact information to call with any issues or questions No treatment recommended at this time    Time spent counseling/coordinating care: > 60 minutes

## 2022-08-24 NOTE — Progress Notes (Signed)
[facility] New Patient Consult   Requesting MD: Abner Greenspan, [place],  [place] [address]   Ryan Garcia 89 y.o.  [birth year]    Reason for Consult: Colon cancer   HPI: Ryan Garcia has a history of coronary artery disease and atrial fibrillation.  He presented to the emergency room on 07/31/2022 with angina.  A CBC found a hemoglobin of 8.2, platelets 178,000, white count 6, and MCV 83.6.  The ferritin returned at 6.  He was diagnosed with demand ischemia.  Medical therapy was recommended.  Gastroenterology was consulted and he was taken for colonoscopy by Dr. Hilarie Fredrickson on 08/01/2022.  2 polyps were found in the proximal ascending colon.  The polyps were not removed.  A fungating mass was found in the mid ascending colon.  The mass was biopsied.  The area was tattooed.  3 polyps were removed from the transverse colon.  3 polyps were removed from the sigmoid colon.  3 polyps were removed from the rectum.  An upper endoscopy 08/01/2022 found a normal esophagus and stomach.  There was erythema in the duodenal bulb. The pathology revealed invasive moderately differentiated adenocarcinoma in the ascending colon biopsy.  The transverse polypectomies revealed fragments of tubular adenomas and minute fragments of moderately differentiated adenocarcinoma.  The adenocarcinoma was felt to be a contaminant from the ascending mass biopsy.  Tubular adenomas without high-grade dysplasia and hyperplastic polyps were found in the sigmoid and rectal polyps.  CTs of the chest, abdomen, and pelvis on 07/31/2022 revealed a 2.5 x 2.6 cm enhancing lesion in the anterior right upper kidney, unchanged compared to an MRI from 08/14/2021.  No suspicious lung nodule, liver lesion, or abdominal/pelvic lymphadenopathy.  He was taken operating room by Dr. Marlou Starks on 08/03/2022 for a laparoscopic-assisted right colectomy.  He was discharged home on 08/11/2022.  Developed recurrent chest pain  08/17/2021 and presented to the [facility] emergency room.  He underwent emergent cardiac catheterization and PCI/stenting of the RCA.  He was diagnosed with a STEMI.  He was discharged 08/19/2022 on Plavix and Eliquis.  Ryan Garcia is here today with his wife and granddaughter.  No complaint today. Past Medical History:  Diagnosis Date   AC (acromioclavicular) joint bone spurs    lt ankle   Allergy    allergic rhinitis   Anginal pain (HCC)    Arthritis    OA   BPH (benign prostatic hyperplasia)    microwave tx of prostate   CAD (coronary artery disease)    a. s/p cath in 2012 showing 70-75% LAD stenosis, 75% D1 stenosis, 75% OM1, 60-70% RCA stenosis, and 80% PDA --> medical therapy pursued b. similar findings by cath in 2016; 06/2017 DES to distal RCA   Chronic kidney disease    Chronic upper back pain    Diabetes mellitus    type II x8 years   GERD (gastroesophageal reflux disease)    HLD (hyperlipidemia)    10/12: TC 208, TG 213, HDL 36, LDL 129   Hx of skin cancer, basal cell    many skin cancers removed/under constant treatment.   Hypertension    NSTEMI (non-ST elevated myocardial infarction) ([place]) 06/18/2017   DES to distal RCA   Obstructive sleep apnea on CPAP    Paroxysmal atrial flutter (HCC)    Renal mass    Scoliosis     .  NSTEMI versus demand ischemia 07/31/2022   .  STEMI 08/17/2022   .  Right colon cancer 08/03/2022  Past Surgical History:  Procedure Laterality Date   BIOPSY  08/01/2022   Procedure: BIOPSY;  Surgeon: Jerene Bears, MD;  Location: [facility] ENDOSCOPY;  Service: Gastroenterology;;   BREAST SURGERY  2009   breast lump removed benign   BUBBLE STUDY  10/14/2020   Procedure: BUBBLE STUDY;  Surgeon: [name], MD;  Location: [place];  Service: Cardiovascular;;   CARDIAC CATHETERIZATION N/A 03/15/2015   Procedure: Left Heart Cath and Coronary Angiography;  Surgeon: Belva Crome, MD;  Location: [place] CV LAB;  Service: Cardiovascular;  Laterality:  N/A;   CARDIOVERSION N/A 10/14/2020   Procedure: CARDIOVERSION;  Surgeon: [name], MD;  Location: [facility] ENDOSCOPY;  Service: Cardiovascular;  Laterality: N/A;   CATARACT EXTRACTION W/PHACO Right 11/29/2021   Procedure: CATARACT EXTRACTION PHACO AND INTRAOCULAR LENS PLACEMENT ([place]) RIGHT DIABETIC;  Surgeon: Leandrew Koyanagi, MD;  Location: [place];  Service: Ophthalmology;  Laterality: Right;  Diabetic 18.72 01:54.3   COLONOSCOPY N/A 08/01/2022   Procedure: COLONOSCOPY;  Surgeon: Jerene Bears, MD;  Location: [place];  Service: Gastroenterology;  Laterality: N/A;   CORONARY STENT INTERVENTION N/A 06/18/2017   Procedure: CORONARY STENT INTERVENTION;  Surgeon: Jettie Booze, MD;  Location: [place] CV LAB;  Service: Cardiovascular;  Laterality: N/A;  RCA   CORONARY/GRAFT ACUTE MI REVASCULARIZATION N/A 08/17/2022   Procedure: Coronary/Graft Acute MI Revascularization;  Surgeon: Yolonda Kida, MD;  Location: [place] CV LAB;  Service: Cardiovascular;  Laterality: N/A;   ESOPHAGOGASTRODUODENOSCOPY (EGD) WITH PROPOFOL N/A 08/01/2022   Procedure: ESOPHAGOGASTRODUODENOSCOPY (EGD) WITH PROPOFOL;  Surgeon: Jerene Bears, MD;  Location: [facility] ENDOSCOPY;  Service: Gastroenterology;  Laterality: N/A;   HEMOSTASIS CLIP PLACEMENT  08/01/2022   Procedure: HEMOSTASIS CLIP PLACEMENT;  Surgeon: Jerene Bears, MD;  Location: [place] ENDOSCOPY;  Service: Gastroenterology;;   LAPAROSCOPIC LYSIS OF ADHESIONS N/A 08/03/2022   Procedure: LAPAROSCOPIC LYSIS OF ADHESIONS;  Surgeon: Jovita Kussmaul, MD;  Location: [place];  Service: General;  Laterality: N/A;   LAPAROSCOPIC PARTIAL COLECTOMY N/A 08/03/2022   Procedure: OPEN PARTIAL COLECTOMY;  Surgeon: Jovita Kussmaul, MD;  Location: [place];  Service: General;  Laterality: N/A;   LEFT HEART CATH AND CORONARY ANGIOGRAPHY N/A 06/18/2017   Procedure: LEFT HEART CATH AND CORONARY ANGIOGRAPHY;  Surgeon: Jettie Booze, MD;  Location: [place]  CV LAB;  Service: Cardiovascular;  Laterality: N/A;   LEFT HEART CATH AND CORONARY ANGIOGRAPHY N/A 04/12/2020   Procedure: LEFT HEART CATH AND CORONARY ANGIOGRAPHY;  Surgeon: Martinique, Peter M, MD;  Location: [place] CV LAB;  Service: Cardiovascular;  Laterality: N/A;   LEFT HEART CATH AND CORONARY ANGIOGRAPHY N/A 08/17/2022   Procedure: LEFT HEART CATH AND CORONARY ANGIOGRAPHY;  Surgeon: Yolonda Kida, MD;  Location: [place] CV LAB;  Service: Cardiovascular;  Laterality: N/A;   POLYPECTOMY  08/01/2022   Procedure: POLYPECTOMY;  Surgeon: Jerene Bears, MD;  Location: [place];  Service: Gastroenterology;;   SUBMUCOSAL TATTOO INJECTION  08/01/2022   Procedure: SUBMUCOSAL TATTOO INJECTION;  Surgeon: Jerene Bears, MD;  Location: [place];  Service: Gastroenterology;;   TEE WITHOUT CARDIOVERSION N/A 10/14/2020   Procedure: TRANSESOPHAGEAL ECHOCARDIOGRAM (TEE);  Surgeon: [name], MD;  Location: [place];  Service: Cardiovascular;  Laterality: N/A;   ULTRASOUND GUIDANCE FOR VASCULAR ACCESS  06/18/2017   Procedure: Ultrasound Guidance For Vascular Access;  Surgeon: Jettie Booze, MD;  Location: [place] CV LAB;  Service: Cardiovascular;;  right  radial, right femoral    Medications: Reviewed  Allergies:  Allergies  Allergen Reactions   Loratadine Other (See Comments)    Not effective   Simvastatin Other (See Comments)    Intolerant- caused joint pain    Family history: No family history of cancer  Social History:   He lives with his wife in [place].  He is retired from a [employer].  He does not use alcohol or cigarettes.  No transfusion history.  No risk factors for hepatitis.  ROS:   Positives include: Decreased energy for 2 to 3 months prior to hospital admission January 2024, intermittent "twinge "of left-sided chest pain prior to the development of severe chest pain January 2024, decreased vision in the left eye A  complete ROS was otherwise negative.  Physical Exam:  Blood pressure 133/61, pulse 63, temperature 98.2 F (36.8 C), temperature source Oral, resp. rate 18, height '5\' 11"'$  (1.803 m), weight 239 lb 3.2 oz (108.5 kg), SpO2 100 %.  HEENT: Oropharynx without visible mass, neck without mass Lungs: Decreased breath sounds with coarse end inspiratory rhonchi in the lower posterior chest bilaterally, no respiratory distress Cardiac: Distant heart sounds, irregular Abdomen: Healed surgical incision, no hepatosplenomegaly, no mass, nontender GU: Testes without mass Vascular: No leg edema Lymph nodes: No renal, supraclavicular, axillary, or inguinal nodes Neurologic: Alert and oriented, the motor exam appears intact in the upper and lower extremities bilaterally Skin: No rash Musculoskeletal: No spine tenderness   LAB:  CBC  Lab Results  Component Value Date   WBC 8.5 08/22/2022   HGB 10.1 (L) 08/22/2022   HCT 32.1 (L) 08/22/2022   MCV 81.7 08/22/2022   PLT 415.0 (H) 08/22/2022   NEUTROABS 5.7 08/22/2022        CMP  Lab Results  Component Value Date   NA 140 08/22/2022   K 4.1 08/22/2022   CL 105 08/22/2022   CO2 26 08/22/2022   GLUCOSE 161 (H) 08/22/2022   BUN 17 08/22/2022   CREATININE 1.26 08/22/2022   CALCIUM 8.6 08/22/2022   PROT 6.9 08/22/2022   ALBUMIN 3.5 08/22/2022   AST 24 08/22/2022   ALT 45 08/22/2022   ALKPHOS 132 (H) 08/22/2022   BILITOT 0.4 08/22/2022   GFRNONAA >60 08/18/2022   GFRAA >60 07/03/2018     Lab Results  Component Value Date   CEA1 6.1 (H) 07/31/2022    Imaging:  As per HPI    Assessment/Plan:   Right colon cancer, stage III B (pT3,pN1a), status post a right colectomy 08/03/2022 1/18 lymph nodes, one tumor deposit, tumor focally invades pericolonic soft tissue, MSI-high, loss of MLH1 and PMS2 expression, MLH1 hypermethylation present CTs 07/31/2022-no evidence of metastatic disease, stable right renal mass Elevated preoperative  CEA Colonoscopy 08/01/2022-ascending colon mass-invasive moderately differentiated adenocarcinoma, transverse colon polyps-tubular adenomas with minute fragments of moderately differentiated adenocarcinoma (felt to likely be a contaminant from the ascending mass biopsy), sigmoid and rectal polyps-tubular adenoma and hyperplastic polyps Coronary artery disease [place] ischemia versus NSTEMI 07/31/2022 Inferior STEMI, status post PCI to distal RCA 08/17/2022  4.   Paroxysmal atrial fibrillation 5.  Right renal mass-stable on CT 07/31/2022, consistent with a renal cell carcinoma 6.  BPH 7.  Hypertension 8.  Iron deficiency anemia January 2024  9.  Diabetes      Disposition:   Ryan Garcia has been diagnosed with adenocarcinoma the right colon.  He presented with symptomatic anemia.  He has multiple comorbid conditions including coronary artery  disease and atrial fibrillation.  There is a right renal mass consistent with a renal cell carcinoma.  He has been diagnosed with stage III colon cancer.  I discussed the details of the surgical pathology report, the prognosis, and adjuvant treatment options with Ryan Garcia and his family.  The tumor has a MSI high phenotype and appears to be a sporadic tumor.   He has a good prognosis relative to the average stage III colon cancer patient.  This is based on the early stage III disease and the MSI high phenotype.  Patients with MSI high tumors do not achieve significant benefit from adjuvant 5-fluorouracil based therapy.  He will be a candidate for immunotherapy if he develops recurrent disease, but this is currently not indicated in the adjuvant setting.  I do not recommend FOLFOX or CAPOX in his case.  He has multiple comorbid conditions.  He does not appear to have hereditary nonpolyposis colon cancer syndrome, but his family members are at increased risk of developing colorectal cancer and should receive appropriate screening.  He will return to the  lab for a CEA today.  He will return for an office visit and CEA in 4 months.  We will plan for surveillance CT imaging in 6-8 months.  This will be to survey for recurrent colon cancer and follow-up the right renal mass.  Ryan Garcia will follow-up with Dr. Hilarie Fredrickson to discuss the indication for a surveillance colonoscopy.    Betsy Coder, MD  08/24/2022, 4:09 PM

## 2022-08-24 NOTE — Telephone Encounter (Signed)
-----   Message from Abner Greenspan, MD sent at 08/24/2022  3:21 PM EST ----- Anemia is back to where it was before cardiac hospitalization  This is reassuring Work on fluid intake for kidney health  Liver tests are returning to normal

## 2022-08-24 NOTE — Telephone Encounter (Signed)
Left VM requesting pt to call the office back or check his mychart

## 2022-08-27 ENCOUNTER — Telehealth: Payer: Self-pay

## 2022-08-27 NOTE — Telephone Encounter (Signed)
Patient verbal understanding and had no further questions or concerns.

## 2022-08-27 NOTE — Telephone Encounter (Signed)
-----   Message from Ladell Pier, MD sent at 08/24/2022  4:22 PM EST ----- Please call patient, the CEA is normal, follow-up as scheduled

## 2022-08-28 ENCOUNTER — Telehealth: Payer: Self-pay | Admitting: Cardiology

## 2022-08-28 NOTE — Telephone Encounter (Signed)
Wife states she was returning call. Please advise

## 2022-08-28 NOTE — Telephone Encounter (Signed)
Spoke with pt wife, she said a pamela called her. Aware do not see any notations in his chart so not sure who called or why.

## 2022-08-31 DIAGNOSIS — I480 Paroxysmal atrial fibrillation: Secondary | ICD-10-CM | POA: Diagnosis not present

## 2022-08-31 DIAGNOSIS — D127 Benign neoplasm of rectosigmoid junction: Secondary | ICD-10-CM | POA: Diagnosis not present

## 2022-08-31 DIAGNOSIS — D123 Benign neoplasm of transverse colon: Secondary | ICD-10-CM | POA: Diagnosis not present

## 2022-08-31 DIAGNOSIS — I2510 Atherosclerotic heart disease of native coronary artery without angina pectoris: Secondary | ICD-10-CM | POA: Diagnosis not present

## 2022-08-31 DIAGNOSIS — C182 Malignant neoplasm of ascending colon: Secondary | ICD-10-CM | POA: Diagnosis not present

## 2022-08-31 DIAGNOSIS — C186 Malignant neoplasm of descending colon: Secondary | ICD-10-CM | POA: Diagnosis not present

## 2022-08-31 DIAGNOSIS — I21A1 Myocardial infarction type 2: Secondary | ICD-10-CM | POA: Diagnosis not present

## 2022-08-31 DIAGNOSIS — D630 Anemia in neoplastic disease: Secondary | ICD-10-CM | POA: Diagnosis not present

## 2022-08-31 DIAGNOSIS — Z483 Aftercare following surgery for neoplasm: Secondary | ICD-10-CM | POA: Diagnosis not present

## 2022-09-04 DIAGNOSIS — C182 Malignant neoplasm of ascending colon: Secondary | ICD-10-CM | POA: Diagnosis not present

## 2022-09-04 DIAGNOSIS — C186 Malignant neoplasm of descending colon: Secondary | ICD-10-CM | POA: Diagnosis not present

## 2022-09-04 DIAGNOSIS — I21A1 Myocardial infarction type 2: Secondary | ICD-10-CM | POA: Diagnosis not present

## 2022-09-04 DIAGNOSIS — D123 Benign neoplasm of transverse colon: Secondary | ICD-10-CM | POA: Diagnosis not present

## 2022-09-04 DIAGNOSIS — I480 Paroxysmal atrial fibrillation: Secondary | ICD-10-CM | POA: Diagnosis not present

## 2022-09-04 DIAGNOSIS — Z483 Aftercare following surgery for neoplasm: Secondary | ICD-10-CM | POA: Diagnosis not present

## 2022-09-04 DIAGNOSIS — I2510 Atherosclerotic heart disease of native coronary artery without angina pectoris: Secondary | ICD-10-CM | POA: Diagnosis not present

## 2022-09-04 DIAGNOSIS — D630 Anemia in neoplastic disease: Secondary | ICD-10-CM | POA: Diagnosis not present

## 2022-09-04 DIAGNOSIS — D127 Benign neoplasm of rectosigmoid junction: Secondary | ICD-10-CM | POA: Diagnosis not present

## 2022-09-07 ENCOUNTER — Telehealth: Payer: Self-pay

## 2022-09-07 NOTE — Progress Notes (Signed)
Care Management & Coordination Services Pharmacy Team  Reason for Encounter: Appointment Reminder  Contacted patient to confirm telephone appointment with Charlene Brooke , PharmD on 09/12/22 at 8:45. Unsuccessful outreach. Left voicemail for patient to return call.   Have you seen any other providers since your last visit with PCP? Yes- cardiology,endocrinology,oncology,urology,general surgery    Hospital visits:  Medication Reconciliation was completed by comparing discharge summary, patient's EMR and Pharmacy list, and upon discussion with patient.  Admitted to the hospital on 08/17/22 due to STEMI . Discharge date was 08/19/22. Discharged from [facility].    New?Medications Started at [facility] Discharge:?? -started  clopidogrel   Medications that remain the same after Hospital Discharge:??  -All other medications will remain the same.     Star Rating Drugs:  Medication:  Last Fill: Day Supply Glipizide '10mg'$  08/16/22 90 Metformin '1000mg'$  06/28/22 90 Lantus   07/06/22  90 Rosuvastatin '40mg'$  06/28/22 90   Care Gaps: Annual wellness visit in last year? Yes  If Diabetic: Last eye exam / retinopathy screening:2020 Last diabetic foot exam: UTD   Charlene Brooke, PharmD notified  Avel Sensor, [place] Assistant [phone]

## 2022-09-11 DIAGNOSIS — I252 Old myocardial infarction: Secondary | ICD-10-CM | POA: Diagnosis not present

## 2022-09-11 DIAGNOSIS — I2510 Atherosclerotic heart disease of native coronary artery without angina pectoris: Secondary | ICD-10-CM | POA: Diagnosis not present

## 2022-09-11 DIAGNOSIS — N1831 Chronic kidney disease, stage 3a: Secondary | ICD-10-CM | POA: Diagnosis not present

## 2022-09-11 DIAGNOSIS — C189 Malignant neoplasm of colon, unspecified: Secondary | ICD-10-CM | POA: Diagnosis not present

## 2022-09-11 DIAGNOSIS — E1142 Type 2 diabetes mellitus with diabetic polyneuropathy: Secondary | ICD-10-CM | POA: Diagnosis not present

## 2022-09-11 DIAGNOSIS — E1159 Type 2 diabetes mellitus with other circulatory complications: Secondary | ICD-10-CM | POA: Diagnosis not present

## 2022-09-11 DIAGNOSIS — Z794 Long term (current) use of insulin: Secondary | ICD-10-CM | POA: Diagnosis not present

## 2022-09-11 DIAGNOSIS — E1122 Type 2 diabetes mellitus with diabetic chronic kidney disease: Secondary | ICD-10-CM | POA: Diagnosis not present

## 2022-09-12 ENCOUNTER — Ambulatory Visit: Payer: Medicare HMO | Admitting: Pharmacist

## 2022-09-12 ENCOUNTER — Ambulatory Visit (INDEPENDENT_AMBULATORY_CARE_PROVIDER_SITE_OTHER): Payer: Medicare HMO | Admitting: Family Medicine

## 2022-09-12 ENCOUNTER — Telehealth: Payer: Self-pay | Admitting: Family Medicine

## 2022-09-12 ENCOUNTER — Encounter: Payer: Self-pay | Admitting: Family Medicine

## 2022-09-12 ENCOUNTER — Telehealth: Payer: Self-pay | Admitting: Pharmacist

## 2022-09-12 VITALS — BP 135/70 | HR 62 | Temp 97.7°F | Ht 71.0 in | Wt 254.2 lb

## 2022-09-12 DIAGNOSIS — D649 Anemia, unspecified: Secondary | ICD-10-CM

## 2022-09-12 DIAGNOSIS — I2510 Atherosclerotic heart disease of native coronary artery without angina pectoris: Secondary | ICD-10-CM | POA: Diagnosis not present

## 2022-09-12 DIAGNOSIS — R231 Pallor: Secondary | ICD-10-CM

## 2022-09-12 DIAGNOSIS — C182 Malignant neoplasm of ascending colon: Secondary | ICD-10-CM

## 2022-09-12 DIAGNOSIS — R350 Frequency of micturition: Secondary | ICD-10-CM | POA: Diagnosis not present

## 2022-09-12 DIAGNOSIS — R42 Dizziness and giddiness: Secondary | ICD-10-CM

## 2022-09-12 DIAGNOSIS — E1165 Type 2 diabetes mellitus with hyperglycemia: Secondary | ICD-10-CM | POA: Diagnosis not present

## 2022-09-12 DIAGNOSIS — I10 Essential (primary) hypertension: Secondary | ICD-10-CM

## 2022-09-12 LAB — POC URINALSYSI DIPSTICK (AUTOMATED)
Bilirubin, UA: NEGATIVE
Blood, UA: NEGATIVE
Glucose, UA: POSITIVE — AB
Ketones, UA: NEGATIVE
Leukocytes, UA: NEGATIVE
Nitrite, UA: NEGATIVE
Protein, UA: POSITIVE — AB
Spec Grav, UA: 1.025 (ref 1.010–1.025)
Urobilinogen, UA: 0.2 E.U./dL
pH, UA: 6 (ref 5.0–8.0)

## 2022-09-12 MED ORDER — CLOPIDOGREL BISULFATE 75 MG PO TABS: 75.0000 mg | ORAL_TABLET | Freq: Every day | ORAL | 3 refills | Status: DC

## 2022-09-12 NOTE — Assessment & Plan Note (Addendum)
Ua today notes glucose and protein  No signs of uti

## 2022-09-12 NOTE — Patient Instructions (Signed)
Labs today to check on chemistries and anemia and iron  Urinalysis  We will call with results    Drink fluids Eat regularly with protein  Watch glucose levels   Change position slowly  Use walk if you are unsteady

## 2022-09-12 NOTE — Telephone Encounter (Signed)
I spoke with pts wife; (DPR signed) starting today pt has felt dizzy and lightheaded;? If room spins but pt is not steady walking and does not feel good. Pt pale in face. No CP,SOB,H/A and no tightness or pressure in chest and no arm or neck pain. This morning FBS was 70; pt drank juice and BS was 120.Mrs Presutti said received call from endo that hgb A1C was 6.7. Pt had no symptoms. This morning BP 170/68 P 68 and now 154/64 P54. Dr Glori Bickers said if no CP or SOB and no chest symptoms and if condition does not worsen pt can schedule to see Dr Glori Bickers today at 4 PM. Mrs Mckiernan confirmed no chest pain, SOB or pain in arms, neck or jaw. UC & ED precautions given and Mrs Skahan said she would call 911 if pt worsened. Pt has appt with Dr Glori Bickers 09/12/22 at 4:00 pm. Sending note to Dr Glori Bickers and [employer] and will teams [place] CMA.

## 2022-09-12 NOTE — Assessment & Plan Note (Signed)
Worse ealier today with some light headedness Improved now   Lab ordered H/o anemia in setting of colon cancer  Has had iron inf but not oral iron

## 2022-09-12 NOTE — Telephone Encounter (Signed)
Seen today. 

## 2022-09-12 NOTE — Assessment & Plan Note (Signed)
S/p surgery  For surg f/u tomorrow  No new abd c/o  Appetite is not great  On both eliquis and plavix Was light headed and pale today earlier - some improved now   Lab now for cbc/iron

## 2022-09-12 NOTE — Patient Instructions (Signed)
Visit Information  Phone number for Pharmacist: [phone]  Thank you for meeting with me to discuss your medications! Below is a summary of what we talked about during the visit:   Recommendations/Changes made from today's visit: -No med changes -Coordinate refill for clopidogrel -Advised pt to monitor BP and BG daily; call provider with any concerns  Follow up plan: -Health Concierge will call patient 1 month for DM update -Pharmacist follow up televisit scheduled for 3 months -Cardiology appt 11/30/22; oncology appt 12/25/22   Charlene Brooke, PharmD, BCACP Clinical Pharmacist [facility] [phone]

## 2022-09-12 NOTE — Progress Notes (Signed)
Care Management & Coordination Services Pharmacy Note  09/12/2022 Name:  Ryan Garcia MRN:  [id number] DOB:  May 29, 1934  Summary: F/U visit -CAD: recent STEMI with PCI/DES, he was discharged on Plavix with plan to continue for at least a year, he will run out of his 91-monthRx by the end of the week and needs a refill -DM: A1c 6.8% (09/11/22), pt was having some hypoglycemia since discharge and yesterday glipizide was stopped and Lantus reduced to 40 units per endocrine -HTN: Pt is not monitoring BP at home; BP was elevated in OV yesterday (152/52, P 49)  Recommendations/Changes made from today's visit: -No med changes -Coordinate refill for clopidogrel -Advised pt to monitor BP and BG daily; call provider with any concerns  Follow up plan: -Health Concierge will call patient 1 month for DM update -Pharmacist follow up televisit scheduled for 3 months -Cardiology appt 11/30/22; oncology appt 12/25/22    Subjective: Ryan LANDSIEDELis an 89y.o. year old male who is a primary patient of Tower, MWynelle Fanny MD.  The care coordination team was consulted for assistance with disease management and care coordination needs.    Engaged with patient by telephone for follow up visit.  Recent office visits: 08/22/22 Dr TGlori BickersOV: hospital f/u - BMP, CBC, LFT stable. No changes.  Recent consult visits: 09/11/22 Dr SGabriel Carina(Endocrine): f/u DM - A1c 6.8%; stop glipizide. Reduce Lantus to 40 units HS. BP 152/52, P 49.  08/24/22 Dr SBenay Spice(Oncology): new pt - colon cancer, stage III B. S/p colectomy 08/03/22. Good prognosis, no adjuvant treatment at this point.  08/23/22 Dr SDiamantina Providence(Urology): renal mass (2021), LUTS. Continue surveillance. Avoid anticholinergics.  08/23/22 NP JColetta Memos(Cardiology): f/u STEMI, s/p PCI/DES. No changes.  Hospital visits: 08/17/22 - 08/19/22 Admission ([facility]: STEMI - PCI/DES. Rec plavix, Eliquis, rosuvastatin, metoprolol. No triple therapy (ASA) d/t anemia.  07/30/22 -  08/11/22 Admission ([facility]: NSTEMI, colon cancer Dx. Underwent colectomy 08/03/22. NSTEMI 2/2 demand ischemia in s/o anemia. No invasive workup. Start pantoprazole daily.   Objective:  Lab Results  Component Value Date   CREATININE 1.26 08/22/2022   BUN 17 08/22/2022   GFR 51.65 (L) 08/22/2022   EGFR 57 (L) 12/06/2021   GFRNONAA >60 08/18/2022   GFRAA >60 07/03/2018   NA 140 08/22/2022   K 4.1 08/22/2022   CALCIUM 8.6 08/22/2022   CO2 26 08/22/2022   GLUCOSE 161 (H) 08/22/2022    Lab Results  Component Value Date/Time   HGBA1C 7.8 (H) 07/31/2022 04:47 AM   HGBA1C 8.0 (H) 10/11/2020 10:11 PM   GFR 51.65 (L) 08/22/2022 03:48 PM   GFR 57.40 (L) 09/22/2021 08:45 AM   MICROALBUR 3.3 (H) 05/09/2010 08:21 AM   MICROALBUR 1.8 11/21/2007 08:46 AM    Last diabetic Eye exam:  Lab Results  Component Value Date/Time   HMDIABEYEEXA Retinopathy (A) 07/12/2022 12:00 AM    Last diabetic Foot exam:  Lab Results  Component Value Date/Time   HMDIABFOOTEX yes 09/06/2010 12:00 AM     Lab Results  Component Value Date   CHOL 86 07/31/2022   HDL 29 (L) 07/31/2022   LDLCALC 38 07/31/2022   LDLDIRECT 78.5 11/17/2012   TRIG 95 07/31/2022   CHOLHDL 3.0 07/31/2022       Latest Ref Rng & Units 08/22/2022    3:48 PM 08/17/2022    3:02 PM 08/09/2022   12:19 AM  Hepatic Function  Total Protein 6.0 - 8.3 g/dL 6.9  6.3  5.5  Albumin 3.5 - 5.2 g/dL 3.5   2.1   AST 0 - 37 U/L 24  101  19   ALT 0 - 53 U/L 45  112  30   Alk Phosphatase 39 - 117 U/L 132   96   Total Bilirubin 0.2 - 1.2 mg/dL 0.4  0.3  0.6   Bilirubin, Direct 0.0 - 0.3 mg/dL 0.0       Lab Results  Component Value Date/Time   TSH 7.730 (H) 08/17/2022 05:06 PM   TSH 8.32 (H) 09/22/2021 08:45 AM   FREET4 0.68 09/22/2021 08:45 AM   FREET4 0.74 10/12/2020 08:54 AM       Latest Ref Rng & Units 08/22/2022    3:48 PM 08/18/2022    2:52 PM 08/18/2022    5:43 AM  CBC  WBC 4.0 - 10.5 K/uL 8.5   8.5   Hemoglobin 13.0 - 17.0 g/dL 10.1   8.9  8.7   Hematocrit 39.0 - 52.0 % 32.1  30.7  29.5   Platelets 150.0 - 400.0 K/uL 415.0   342     Lab Results  Component Value Date/Time   VITAMINB12 692 07/31/2022 01:30 AM   VITAMINB12 470 01/18/2020 10:10 AM    Clinical ASCVD: Yes  The ASCVD Risk score (Arnett DK, et al., 2019) failed to calculate for the following reasons:   The 2019 ASCVD risk score is only valid for ages 75 to 67   The patient has a prior MI or stroke diagnosis    CHA2DS2/VAS Stroke Risk Points  Current as of yesterday     5 >= 2 Points: High Risk     Points Metrics  0 Has Congestive Heart Failure:  No    Current as of yesterday  1 Has Vascular Disease:  Yes    Current as of yesterday  1 Has Hypertension:  Yes    Current as of yesterday  2 Age:  67    Current as of yesterday  1 Has Diabetes:  Yes    Current as of yesterday  0 Had Stroke:  No  Had TIA:  No  Had Thromboembolism:  No    Current as of yesterday  0 Male:  No    Current as of yesterday        08/24/2022    3:31 PM 07/05/2022   10:48 AM 07/03/2022   10:39 AM  Depression screen PHQ 2/9  Decreased Interest 0 0 0  Down, Depressed, Hopeless 0 0 0  PHQ - 2 Score 0 0 0     Social History   Tobacco Use  Smoking Status Never   Passive exposure: Never  Smokeless Tobacco Never   BP Readings from Last 3 Encounters:  08/24/22 133/61  08/23/22 128/70  08/23/22 (!) 124/46   Pulse Readings from Last 3 Encounters:  08/24/22 63  08/23/22 71  08/23/22 71   Wt Readings from Last 3 Encounters:  08/24/22 239 lb 3.2 oz (108.5 kg)  08/23/22 244 lb (110.7 kg)  08/23/22 240 lb 6.4 oz (109 kg)   BMI Readings from Last 3 Encounters:  08/24/22 33.36 kg/m  08/23/22 33.56 kg/m  08/23/22 33.53 kg/m    Allergies  Allergen Reactions   Loratadine Other (See Comments)    Not effective   Simvastatin Other (See Comments)    Intolerant- caused joint pain    Medications Reviewed Today     Reviewed by Charlton Haws, [facility]  (Pharmacist) on 09/12/22 at [phone]  Med List Status: <None>   Medication Order Taking? Sig Documenting Provider Last Dose Status Informant  acetaminophen (TYLENOL) 325 MG tablet [id number] Yes Take 325-650 mg by mouth every 6 (six) hours as needed for mild pain or headache. [provider] Taking Active Self, Pharmacy Records  Alcohol Swabs (B-D SINGLE USE SWABS REGULAR) PADS [id number] Yes Use to check blood sugar 2 times a day Tower, Wynelle Fanny, MD Taking Active Self, Pharmacy Records  apixaban (ELIQUIS) 5 MG TABS tablet [id number] Yes Take 1 tablet (5 mg total) by mouth 2 (two) times daily. Lelon Perla, MD Taking Active Self, Pharmacy Records  Blood Glucose Calibration (TRUE METRIX LEVEL 1) Low SOLN [id number] Yes Use to check control on glucose meter Tower, Wynelle Fanny, MD Taking Active Self, Pharmacy Records  Blood Glucose Monitoring Suppl (TRUE METRIX AIR GLUCOSE METER) w/Device KIT [id number] Yes 1 kit by Other route 2 (two) times daily. Check blood sugar twice daily and as directed. Dx E11.65 Abner Greenspan, MD Taking Active Self, Pharmacy Records  cetirizine (ZYRTEC) 10 MG tablet [id number] Yes Take 10 mg by mouth daily. [provider] Taking Active   clopidogrel (PLAVIX) 75 MG tablet [id number] Yes Take 1 tablet (75 mg total) by mouth daily. Max Sane, MD Taking Active   Coenzyme Q10 (CO Q 10 PO) [id number] Yes Take 200 mg by mouth daily. [provider] Taking Active Self, Pharmacy Records  Cyanocobalamin (VITAMIN B12) 1000 MCG TBCR [id number] Yes Take 1,000 mcg by mouth daily. Cheryln Manly, NP Taking Active Self, Pharmacy Records  DROPLET PEN NEEDLES 31G X 6 MM MISC [id number] Yes USE ONE TIME DAILY  AT  BEDTIME  WITH  LANTUS Tower, Wynelle Fanny, MD Taking Active Self, Pharmacy Records  fluticasone [facility]) 50 MCG/ACT nasal spray [id number] Yes Place 1 spray into both nostrils daily as needed for allergies.  [provider] Taking Active Self, Pharmacy Records   furosemide (LASIX) 20 MG tablet [id number] Yes Take 20 mg by mouth daily. [provider] Taking Active Self, Pharmacy Records  insulin glargine (LANTUS SOLOSTAR) 100 UNIT/ML Solostar Pen [id number] Yes Inject 50 Units into the skin at bedtime.  Patient taking differently: Inject 40 Units into the skin at bedtime.   Tower, Wynelle Fanny, MD Taking Active Self, Pharmacy Records  isosorbide mononitrate (IMDUR) 60 MG 24 hr tablet [id number] Yes TAKE 1 TABLET EVERY DAY Crenshaw, Denice Bors, MD Taking Active Self, Pharmacy Records  metFORMIN (GLUCOPHAGE) 1000 MG tablet [id number] Yes TAKE 1 TABLET TWICE DAILY WITH MEALS Tower, Wynelle Fanny, MD Taking Active Self, Pharmacy Records  metoprolol tartrate (LOPRESSOR) 25 MG tablet [id number] Yes Take 0.5 tablets (12.5 mg total) by mouth 2 (two) times daily. Lelon Perla, MD Taking Active Self, Pharmacy Records  nitroGLYCERIN (NITROSTAT) 0.4 MG SL tablet [id number] Yes Place 1 tablet (0.4 mg total) under the tongue every 5 (five) minutes as needed for chest pain. Ledora Bottcher, PA Taking Active Self, Pharmacy Records  NOVOLOG FLEXPEN 100 UNIT/ML FlexPen [id number] Yes Inject 16 Units into the skin daily. Before dinner [provider] Taking Active Self, Pharmacy Records  pantoprazole (PROTONIX) 40 MG tablet [id number] Yes Take 1 tablet (40 mg total) by mouth daily at 6 (six) AM. Elodia Florence., MD Taking Active   rosuvastatin (CRESTOR) 40 MG tablet [id number] Yes Take 1 tablet (40 mg total) by mouth daily. Lelon Perla, MD Taking Active Self, Pharmacy Records  TRUE [facility] BLOOD GLUCOSE TEST test strip [id number] Yes CHECK BLOOD  SUGAR TWICE DAILY Tower, Wynelle Fanny, MD Taking Active Self, Pharmacy Records  TRUEplus Lancets [place] [id number] Yes Check blood sugar twice daily and as directed. Dx E11.65 Abner Greenspan, MD Taking Active Self, Pharmacy Records            SDOH:  (Social Determinants of Health) assessments and interventions  performed: No SDOH Interventions    Flowsheet Row Office Visit from 08/24/2022 in [place] at [place] from 08/20/2022 in [place] ED to [place] (Discharged) from 08/17/2022 in [place] [facility] Telephone from 08/15/2022 in [place] [facility] from 08/31/2020 in [place] [facility] [place] Interventions Intervention Not Indicated Intervention Not Indicated -- Intervention Not Indicated --  Housing Interventions Intervention Not Indicated Intervention Not Indicated Intervention Not Indicated Intervention Not Indicated --  Transportation Interventions Intervention Not Indicated Intervention Not Indicated -- Intervention Not Indicated --  Utilities Interventions Intervention Not Indicated -- -- -- --  Financial Strain Interventions -- -- -- -- Other (Comment)  [Apply for Lantus patient assistance]       Medication Assistance: None required.  Patient affirms current coverage meets needs.  Medication Access: Within the past 30 days, how often has patient missed a dose of medication? 0 Is a pillbox or other method used to improve adherence? Yes  Factors that may affect medication adherence? no barriers identified Are meds synced by current pharmacy? No  Are meds delivered by current pharmacy? Yes  Does patient experience delays in picking up medications due to transportation concerns? No   [facility] Services Reviewed: Is patient disadvantaged to use [facility]?: Yes  Current Rx insurance plan: [employer] Name and location of Current pharmacy:  [facility] - [address], [place] [address] Phone: [phone] Fax: [place], [place] - [address] [place] [address] Phone: [phone] Fax: [phone]  [facility] Pharm[facility] services reviewed with patient today?: No  Patient requests to transfer care to [facility] Pharmacy?: No  Reason patient declined to change pharmacies: Disadvantaged due to insurance/mail order  Compliance/Adherence/Medication fill history: Care Gaps: Foot exam (due 12/2019)  Star-Rating Drugs: Glipizide - PDC 100% Metformin - PDC 100% Rosuvastatin - PDC 100%   ASSESSMENT / PLAN  Hypertension / Heart Failure (BP goal < 140/90) -Query Controlled - pt is not checking BP at home; BP was elevated (152/25, P 49) in OV yesterday -Current home BP readings: none available -Last ejection fraction: 60-65% (Date: 08/2022) -HF type: HFpEF (EF > 50%) -NYHA Class: I-II -AHA HF Stage: C (Heart disease and symptoms present) -Current treatment: Metoprolol tartate 25 mg - 1/2 BID - Appropriate, Query Effective Isosorbide Mononitrate 60 mg daily AM - Appropriate, Query Effective Furosemide 20 mg daily - Appropriate, Query Effective -Medications previously tried: HCTZ, lisinopril (AKI) -Current exercise habits:  Mowing lawn (13 acres); cutting logs -Educated on Importance of home blood pressure monitoring; Proper BP monitoring technique; -Counseled to monitor BP at home daily; contact provider if BP consistently > 150/90 -Recommended to continue current medication  Atrial Fibrillation (Goal: prevent stroke and major bleeding) -Controlled - per pt report -CHADSVASC: 5 -Current treatment: Metoprolol tartrate 25 mg - 1/2 tab BID - Appropriate, Effective, Safe, Accessible Eliquis  5 mg BID - Appropriate, Effective, Safe, Accessible -Medications previously tried: n/a -Counseled on increased risk of stroke due to Afib and benefits of anticoagulation for stroke prevention; importance of adherence to anticoagulant exactly as prescribed; -Recommended to continue current  medication  Hyperlipidemia/CAD: (LDL goal < 70) -Controlled - LDL 38 (07/2022); pt reports he had cramps with statin previously and COQ10 has helped -Hx NSTEMI 06/2017; STEMI 08/2022 s/p PCI/DES -Current treatment: Rosuvastatin 40 mg daily - Appropriate, Effective, Safe, Accessible Clopidogrel 75 mg daily  -Appropriate, Effective, Safe, Accessible Isosorbide MN 60 mg daily -Appropriate, Effective, Safe, Accessible CoQ10 daily - Appropriate, Effective, Safe, Accessible Nitroglycerin 0.4 mg SL prn - Appropriate, Effective, Safe, Accessible -Medications previously tried: Lipitor (myopathy), pravastatin, simvastatin -Educated on Benefits of statin for ASCVD risk reduction; -Reviewed purpose of clopidogrel in light of recent PCI/DES; pt will need refill, he has 4 pills left - coording with PCP for refill -Recommended to continue current medication  Diabetes (A1c goal < 7.5%) -Query overcontrolled -  A1c 6.8% (08/2022), pt has been having some low sugars since being discharged from hospital and resuming same insulin regimen despite lower appetite, yesterday glipizide was stopped and Lantus reduced per endocrine -Follows with [facility] (Dr Gabriel Carina) -Current home glucose readings: checks before breakfast and 2-4 hours after supper  -Current medications: Metformin 1000 mg BID w/ meals - Appropriate, Effective, Safe, Accessible Lantus 40 units HS - Appropriate, Effective, Query Safe Novolog 16 units w/ supper - Appropriate, Effective, Safe, Accessible Glipizide 10 mg BID w/ meals -stopped 3/12 -Medications previously tried: Ozempic (cost) -Educated on A1c and blood sugar goals; Benefits of routine self-monitoring of blood sugar; -Recommended to continue current medication; keep regular f/u with endocrine  Health Maintenance -Vaccine gaps: None -OTC: cetirizine  Charlene Brooke, PharmD, [place] Pharmacist [place] at [facility] [phone]

## 2022-09-12 NOTE — Progress Notes (Signed)
Subjective:    Patient ID: Ryan Garcia, male    DOB: Sep 21, 1933, 89 y.o.   MRN: [id number]  HPI Pt presents with dizziness/pallor   Wt Readings from Last 3 Encounters:  09/12/22 254 lb 4 oz (115.3 kg)  08/24/22 239 lb 3.2 oz (108.5 kg)  08/23/22 244 lb (110.7 kg)   35.46 kg/m  Vitals:   09/12/22 1558  BP: 132/60  Pulse: 62  Temp: 97.7 F (36.5 C)  SpO2: 96%   Got up this am  Light headed and staggery  Had to hold on to get to bathroom  Glucose was 70  Ate something and it came up to 103 (choc milk and cookies)  Drank coffee Did not feel himself all day /uneasy on feet  Looked more pale than usual / that is improved now   Feels a little improved   No abd pain besides incision pain)  --- has surgeon appt tomorrow  No chest pain or sob     Reduced dm med yesterday   BP Readings from Last 3 Encounters:  09/12/22 132/60  08/24/22 133/61  08/23/22 128/70   Pulse Readings from Last 3 Encounters:  09/12/22 62  08/24/22 63  08/23/22 71      Lab Results  Component Value Date   WBC 8.5 08/22/2022   HGB 10.1 (L) 08/22/2022   HCT 32.1 (L) 08/22/2022   MCV 81.7 08/22/2022   PLT 415.0 (H) 08/22/2022    On both plavix and eliquis for a fib and CAD   Results for orders placed or performed in visit on 09/12/22  POCT Urinalysis Dipstick (Automated)  Result Value Ref Range   Color, UA Yellow    Clarity, UA Clear    Glucose, UA Positive (A) Negative   Bilirubin, UA Negative    Ketones, UA Negative    Spec Grav, UA 1.025 1.010 - 1.025   Blood, UA Negative    pH, UA 6.0 5.0 - 8.0   Protein, UA Positive (A) Negative   Urobilinogen, UA 0.2 0.2 or 1.0 E.U./dL   Nitrite, UA Negative    Leukocytes, UA Negative Negative     Surgeon f/u is tomorrow  Patient Active Problem List   Diagnosis Date Noted   Pallor 09/12/2022   Frequent urination 09/12/2022   Transaminasemia 08/22/2022   STEMI involving right coronary artery ([place]) 08/17/2022   Electrolyte  abnormality 08/17/2022   Paroxysmal atrial flutter ([place]) 08/17/2022   Ascending colon malignant neoplasm ([place]) 08/01/2022   Benign neoplasm of transverse colon 08/01/2022   Benign neoplasm of sigmoid colon 08/01/2022   Benign neoplasm of rectum 08/01/2022   Duodenitis 08/01/2022   Right shoulder pain 11/21/2021   Carpal tunnel syndrome 11/21/2021   Atrial flutter with rapid ventricular response (HCC)    Rapid atrial fibrillation ([place]) 10/11/2020   Poor balance 07/26/2020   Anemia 04/20/2020   Right renal mass 04/18/2020   AKI (acute kidney injury) ([place]) 04/14/2020   Cholangitis 04/14/2020   Coronary artery disease involving native coronary artery of native heart without angina pectoris 01/18/2019   Medicare annual wellness visit, subsequent 01/16/2019   Obesity (BMI 30-39.9) 07/09/2018   HOH (hard of hearing) 03/31/2018   Uncontrolled type 2 diabetes mellitus with hyperglycemia ([place]) 10/28/2017   Subclinical hypothyroidism 10/28/2017   Scoliosis    Obstructive sleep apnea on CPAP    Hx of skin cancer, basal cell    Hyperlipidemia associated with type 2 diabetes mellitus ([place])    GERD (gastroesophageal  reflux disease)    Chronic upper back pain    Arthritis    Allergy    NSTEMI (non-ST elevated myocardial infarction) ([place]) 06/18/2017   Actinic keratoses 04/29/2017   Positive colorectal cancer screening using Cologuard test 10/31/2016   BPH (benign prostatic hyperplasia) 09/04/2016   B12 deficiency 11/22/2015   Routine general medical examination at a health care facility 08/26/2015   Abnormal nuclear cardiac imaging test    Dyspnea on exertion 11/27/2013   Encounter for examination of normal volunteer in research study 05/29/2013   Prostate cancer screening 07/15/2012   CAD S/P percutaneous coronary angioplasty 05/17/2011   Nonspecific abnormal results of cardiovascular function study 04/30/2011   Abnormal EKG 04/06/2011   Right low back pain 01/13/2010   Insulin dependent  diabetes mellitus 10/04/2006   ERECTILE DYSFUNCTION 10/04/2006   Primary hypertension 10/04/2006   Allergic rhinitis 10/04/2006   OSTEOARTHRITIS 10/04/2006   Sleep apnea 10/04/2006   EDEMA 10/04/2006   SKIN CANCER, HX OF 10/04/2006   Past Medical History:  Diagnosis Date   AC (acromioclavicular) joint bone spurs    lt ankle   Allergy    allergic rhinitis   Anginal pain (HCC)    Arthritis    OA   BPH (benign prostatic hyperplasia)    microwave tx of prostate   CAD (coronary artery disease)    a. s/p cath in 2012 showing 70-75% LAD stenosis, 75% D1 stenosis, 75% OM1, 60-70% RCA stenosis, and 80% PDA --> medical therapy pursued b. similar findings by cath in 2016; 06/2017 DES to distal RCA   Chronic kidney disease    Chronic upper back pain    Diabetes mellitus    type II x8 years   GERD (gastroesophageal reflux disease)    HLD (hyperlipidemia)    10/12: TC 208, TG 213, HDL 36, LDL 129   Hx of skin cancer, basal cell    many skin cancers removed/under constant treatment.   Hypertension    NSTEMI (non-ST elevated myocardial infarction) ([place]) 06/18/2017   DES to distal RCA   Obstructive sleep apnea on CPAP    Paroxysmal atrial flutter ([place])    Renal mass    Scoliosis    Past Surgical History:  Procedure Laterality Date   BIOPSY  08/01/2022   Procedure: BIOPSY;  Surgeon: Jerene Bears, MD;  Location: [facility] ENDOSCOPY;  Service: Gastroenterology;;   BREAST SURGERY  2009   breast lump removed benign   BUBBLE STUDY  10/14/2020   Procedure: BUBBLE STUDY;  Surgeon: [name], MD;  Location: [place];  Service: Cardiovascular;;   CARDIAC CATHETERIZATION N/A 03/15/2015   Procedure: Left Heart Cath and Coronary Angiography;  Surgeon: Belva Crome, MD;  Location: [place] CV LAB;  Service: Cardiovascular;  Laterality: N/A;   CARDIOVERSION N/A 10/14/2020   Procedure: CARDIOVERSION;  Surgeon: [name], MD;  Location: [facility] ENDOSCOPY;  Service: Cardiovascular;   Laterality: N/A;   CATARACT EXTRACTION W/PHACO Right 11/29/2021   Procedure: CATARACT EXTRACTION PHACO AND INTRAOCULAR LENS PLACEMENT ([place]) RIGHT DIABETIC;  Surgeon: Leandrew Koyanagi, MD;  Location: [place];  Service: Ophthalmology;  Laterality: Right;  Diabetic 18.72 01:54.3   COLONOSCOPY N/A 08/01/2022   Procedure: COLONOSCOPY;  Surgeon: Jerene Bears, MD;  Location: [place];  Service: Gastroenterology;  Laterality: N/A;   CORONARY STENT INTERVENTION N/A 06/18/2017   Procedure: CORONARY STENT INTERVENTION;  Surgeon: Jettie Booze, MD;  Location: [place] [facility];  Service: Cardiovascular;  Laterality: N/A;  RCA   CORONARY/GRAFT ACUTE MI REVASCULARIZATION N/A 08/17/2022   Procedure: Coronary/Graft Acute MI Revascularization;  Surgeon: Yolonda Kida, MD;  Location: [place] [facility] LAB;  Service: Cardiovascular;  Laterality: N/A;   ESOPHAGOGASTRODUODENOSCOPY (EGD) WITH PROPOFOL N/A 08/01/2022   Procedure: ESOPHAGOGASTRODUODENOSCOPY (EGD) WITH PROPOFOL;  Surgeon: Jerene Bears, MD;  Location: [facility] ENDOSCOPY;  Service: Gastroenterology;  Laterality: N/A;   HEMOSTASIS CLIP PLACEMENT  08/01/2022   Procedure: HEMOSTASIS CLIP PLACEMENT;  Surgeon: Jerene Bears, MD;  Location: [place] ENDOSCOPY;  Service: Gastroenterology;;   LAPAROSCOPIC LYSIS OF ADHESIONS N/A 08/03/2022   Procedure: LAPAROSCOPIC LYSIS OF ADHESIONS;  Surgeon: Jovita Kussmaul, MD;  Location: [place];  Service: General;  Laterality: N/A;   LAPAROSCOPIC PARTIAL COLECTOMY N/A 08/03/2022   Procedure: OPEN PARTIAL COLECTOMY;  Surgeon: Jovita Kussmaul, MD;  Location: [place];  Service: General;  Laterality: N/A;   LEFT HEART CATH AND CORONARY ANGIOGRAPHY N/A 06/18/2017   Procedure: LEFT HEART CATH AND CORONARY ANGIOGRAPHY;  Surgeon: Jettie Booze, MD;  Location: [place] CV LAB;  Service: Cardiovascular;  Laterality: N/A;   LEFT HEART CATH AND CORONARY ANGIOGRAPHY N/A 04/12/2020   Procedure: LEFT HEART CATH AND  CORONARY ANGIOGRAPHY;  Surgeon: Martinique, Peter M, MD;  Location: [place] CV LAB;  Service: Cardiovascular;  Laterality: N/A;   LEFT HEART CATH AND CORONARY ANGIOGRAPHY N/A 08/17/2022   Procedure: LEFT HEART CATH AND CORONARY ANGIOGRAPHY;  Surgeon: Yolonda Kida, MD;  Location: [place] [facility];  Service: Cardiovascular;  Laterality: N/A;   POLYPECTOMY  08/01/2022   Procedure: POLYPECTOMY;  Surgeon: Jerene Bears, MD;  Location: [place];  Service: Gastroenterology;;   SUBMUCOSAL TATTOO INJECTION  08/01/2022   Procedure: SUBMUCOSAL TATTOO INJECTION;  Surgeon: Jerene Bears, MD;  Location: [place];  Service: Gastroenterology;;   TEE WITHOUT CARDIOVERSION N/A 10/14/2020   Procedure: TRANSESOPHAGEAL ECHOCARDIOGRAM (TEE);  Surgeon: [name], MD;  Location: [place];  Service: Cardiovascular;  Laterality: N/A;   ULTRASOUND GUIDANCE FOR VASCULAR ACCESS  06/18/2017   Procedure: Ultrasound Guidance For Vascular Access;  Surgeon: Jettie Booze, MD;  Location: [place] [facility];  Service: Cardiovascular;;  right radial, right femoral   Social History   Tobacco Use   Smoking status: Never    Passive exposure: Never   Smokeless tobacco: Never  Vaping Use   Vaping Use: Never used  Substance Use Topics   Alcohol use: No    Alcohol/week: 0.0 standard drinks of alcohol   Drug use: No   Family History  Problem Relation Age of Onset   Heart attack Brother    Colon cancer Neg Hx    Colon polyps Neg Hx    Stomach cancer Neg Hx    Esophageal cancer Neg Hx    Allergies  Allergen Reactions   Loratadine Other (See Comments)    Not effective   Simvastatin Other (See Comments)    Intolerant- caused joint pain   Current Outpatient Medications on File Prior to Visit  Medication Sig Dispense Refill   acetaminophen (TYLENOL) 325 MG tablet Take 325-650 mg by mouth every 6 (six) hours as needed for mild pain or headache.     Alcohol Swabs (B-D SINGLE USE SWABS  REGULAR) PADS Use to check blood sugar 2 times a day 200 each 3   apixaban (ELIQUIS) 5 MG TABS tablet Take 1 tablet (5 mg total) by mouth 2 (two) times daily. 180 tablet 1   Blood Glucose Calibration (TRUE METRIX  LEVEL 1) Low SOLN Use to check control on glucose meter 1 each 3   Blood Glucose Monitoring Suppl (TRUE METRIX AIR GLUCOSE METER) w/Device KIT 1 kit by Other route 2 (two) times daily. Check blood sugar twice daily and as directed. Dx E11.65 1 kit 0   cetirizine (ZYRTEC) 10 MG tablet Take 10 mg by mouth daily.     clopidogrel (PLAVIX) 75 MG tablet Take 1 tablet (75 mg total) by mouth daily. 90 tablet 3   Coenzyme Q10 (CO Q 10 PO) Take 200 mg by mouth daily.     Cyanocobalamin (VITAMIN B12) 1000 MCG TBCR Take 1,000 mcg by mouth daily. 30 tablet    DROPLET PEN NEEDLES 31G X 6 MM MISC USE ONE TIME DAILY  AT  BEDTIME  WITH  LANTUS 100 each 3   fluticasone (FLONASE) 50 MCG/ACT nasal spray Place 1 spray into both nostrils daily as needed for allergies.      furosemide (LASIX) 20 MG tablet Take 20 mg by mouth daily.     insulin glargine (LANTUS SOLOSTAR) 100 UNIT/ML Solostar Pen Inject 50 Units into the skin at bedtime. (Patient taking differently: Inject 40 Units into the skin at bedtime.) 45 mL 3   isosorbide mononitrate (IMDUR) 60 MG 24 hr tablet TAKE 1 TABLET EVERY DAY 90 tablet 1   metFORMIN (GLUCOPHAGE) 1000 MG tablet TAKE 1 TABLET TWICE DAILY WITH MEALS 180 tablet 2   metoprolol tartrate (LOPRESSOR) 25 MG tablet Take 0.5 tablets (12.5 mg total) by mouth 2 (two) times daily. 90 tablet 3   nitroGLYCERIN (NITROSTAT) 0.4 MG SL tablet Place 1 tablet (0.4 mg total) under the tongue every 5 (five) minutes as needed for chest pain. 25 tablet 3   NOVOLOG FLEXPEN 100 UNIT/ML FlexPen Inject 16 Units into the skin daily. Before dinner     pantoprazole (PROTONIX) 40 MG tablet Take 1 tablet (40 mg total) by mouth daily at 6 (six) AM. 30 tablet 1   rosuvastatin (CRESTOR) 40 MG tablet Take 1 tablet (40 mg  total) by mouth daily. 90 tablet 1   TRUE METRIX BLOOD GLUCOSE TEST test strip CHECK BLOOD SUGAR TWICE DAILY 200 strip 3   TRUEplus Lancets 30G MISC Check blood sugar twice daily and as directed. Dx E11.65 200 each 3   No current facility-administered medications on file prior to visit.     Review of Systems  Constitutional:  Positive for fatigue. Negative for activity change, appetite change, fever and unexpected weight change.  HENT:  Negative for congestion, rhinorrhea, sore throat and trouble swallowing.   Eyes:  Negative for pain, redness, itching and visual disturbance.  Respiratory:  Negative for cough, chest tightness, shortness of breath and wheezing.   Cardiovascular:  Negative for chest pain and palpitations.  Gastrointestinal:  Negative for abdominal pain, blood in stool, constipation, diarrhea and nausea.       Other than incision soreness no abd pain  Endocrine: Negative for cold intolerance, heat intolerance, polydipsia and polyuria.  Genitourinary:  Negative for difficulty urinating, dysuria, frequency and urgency.  Musculoskeletal:  Negative for arthralgias, joint swelling and myalgias.  Skin:  Negative for pallor and rash.  Neurological:  Positive for light-headedness. Negative for dizziness, tremors, seizures, syncope, facial asymmetry, speech difficulty, weakness, numbness and headaches.  Hematological:  Negative for adenopathy. Does not bruise/bleed easily.  Psychiatric/Behavioral:  Negative for decreased concentration and dysphoric mood. The patient is not nervous/anxious.        Objective:   Physical Exam  Constitutional:      General: He is not in acute distress.    Appearance: Normal appearance. He is well-developed. He is obese. He is not ill-appearing or diaphoretic.  HENT:     Head: Normocephalic and atraumatic.     Mouth/Throat:     Mouth: Mucous membranes are moist.     Pharynx: Oropharynx is clear.  Eyes:     General: No scleral icterus.     Conjunctiva/sclera: Conjunctivae normal.     Pupils: Pupils are equal, round, and reactive to light.     Comments: Mild conj pallor   Neck:     Thyroid: No thyromegaly.     Vascular: No carotid bruit or JVD.  Cardiovascular:     Rate and Rhythm: Normal rate.     Heart sounds: Normal heart sounds.     No gallop.  Pulmonary:     Effort: Pulmonary effort is normal. No respiratory distress.     Breath sounds: Normal breath sounds. No stridor. No wheezing, rhonchi or rales.  Abdominal:     General: Abdomen is protuberant. There is no distension or abdominal bruit.     Palpations: Abdomen is soft. There is no hepatomegaly, splenomegaly or mass.     Tenderness: There is no abdominal tenderness. There is no right CVA tenderness, left CVA tenderness, guarding or rebound.     Comments: Upper 1/2 of vertical scar is slightly erythematous No drainage   Musculoskeletal:     Cervical back: Normal range of motion and neck supple. No tenderness.     Right lower leg: Edema present.     Left lower leg: Edema present.  Lymphadenopathy:     Cervical: No cervical adenopathy.  Skin:    General: Skin is warm and dry.     Coloration: Skin is not pale.     Findings: No rash.  Neurological:     Mental Status: He is alert.     Motor: No weakness.     Coordination: Coordination normal.     Deep Tendon Reflexes: Reflexes are normal and symmetric. Reflexes normal.     Comments: No focal weakness  C/o light headedness when walking  Gait is stable and wide based   No focal changes   Psychiatric:        Mood and Affect: Mood normal.           Assessment & Plan:   Problem List Items Addressed This Visit       Cardiovascular and Mediastinum   Coronary artery disease involving native coronary artery of native heart without angina pectoris    Continues both eliquis and plavix for this and a fib  No angina Bp stable      Primary hypertension - Primary    S/p hosp for STEMI doing well  bp in  fair control at this time  BP Readings from Last 1 Encounters:  09/12/22 135/70  Stable in setting of some light headedness/ no orthostatic change  No changes needed Most recent labs reviewed  Disc lifstyle change with low sodium diet and exercise  Metoprolol 12.5 mg isosorbide  60 mg daily  Lasix 20 mg daily  Under cardiology care         Relevant Orders   CBC with Differential/Platelet   Comprehensive metabolic panel     Digestive   Ascending colon malignant neoplasm [facility])    S/p surgery  For surg f/u tomorrow  No new abd c/o  Appetite is not great  On  both eliquis and plavix Was light headed and pale today earlier - some improved now   Lab now for cbc/iron       Relevant Orders   CBC with Differential/Platelet   Comprehensive metabolic panel   Iron     Endocrine   Uncontrolled type 2 diabetes mellitus with hyperglycemia ([place])    Per pt glucose of 70 this am Enc more regular protein  Rev endo note from yesterday        Other   Anemia    Light headed and pale earlier today  Reassuring exam today Labs are pending         Frequent urination    Ua today notes glucose and protein  No signs of uti      Relevant Orders   POCT Urinalysis Dipstick (Automated) (Completed)   Light headed   Pallor    Worse ealier today with some light headedness Improved now   Lab ordered H/o anemia in setting of colon cancer  Has had iron inf but not oral iron       Relevant Orders   CBC with Differential/Platelet   Iron

## 2022-09-12 NOTE — Assessment & Plan Note (Signed)
Per pt glucose of 70 this am Enc more regular protein  Rev endo note from yesterday

## 2022-09-12 NOTE — Telephone Encounter (Signed)
done

## 2022-09-12 NOTE — Telephone Encounter (Signed)
Pt's wife called stating the pt is experiencing dizziness & looks pale in the face. Routing to [employer] Call back # [phone]

## 2022-09-12 NOTE — Assessment & Plan Note (Signed)
Continues both eliquis and plavix for this and a fib  No angina Bp stable

## 2022-09-12 NOTE — Telephone Encounter (Signed)
Patient was prescribed clopidogrel in hospital following STEMI, PCI w/ DES with plan to continue for at least a year. He was given a 30 day supply and has 4 pills left, he is in need of refill.  Routing to PCP for refill.  Preferred pharmacy: [facility] [id number], [place] [address] Phone: [phone] Fax: [phone]

## 2022-09-12 NOTE — Assessment & Plan Note (Signed)
S/p hosp for STEMI doing well  bp in fair control at this time  BP Readings from Last 1 Encounters:  09/12/22 135/70  Stable in setting of some light headedness/ no orthostatic change  No changes needed Most recent labs reviewed  Disc lifstyle change with low sodium diet and exercise  Metoprolol 12.5 mg isosorbide  60 mg daily  Lasix 20 mg daily  Under cardiology care

## 2022-09-12 NOTE — Assessment & Plan Note (Signed)
Light headed and pale earlier today  Reassuring exam today Labs are pending

## 2022-09-13 ENCOUNTER — Telehealth: Payer: Self-pay | Admitting: Family Medicine

## 2022-09-13 ENCOUNTER — Other Ambulatory Visit: Payer: Self-pay | Admitting: Cardiology

## 2022-09-13 LAB — COMPREHENSIVE METABOLIC PANEL
ALT: 10 U/L (ref 0–53)
AST: 11 U/L (ref 0–37)
Albumin: 3.4 g/dL — ABNORMAL LOW (ref 3.5–5.2)
Alkaline Phosphatase: 84 U/L (ref 39–117)
BUN: 14 mg/dL (ref 6–23)
CO2: 27 mEq/L (ref 19–32)
Calcium: 8.5 mg/dL (ref 8.4–10.5)
Chloride: 106 mEq/L (ref 96–112)
Creatinine, Ser: 1.22 mg/dL (ref 0.40–1.50)
GFR: 53.66 mL/min — ABNORMAL LOW (ref >60.00)
Glucose, Bld: 241 mg/dL — ABNORMAL HIGH (ref 70–99)
Potassium: 4.3 mEq/L (ref 3.5–5.1)
Sodium: 139 mEq/L (ref 135–145)
Total Bilirubin: 0.5 mg/dL (ref 0.2–1.2)
Total Protein: 6.3 g/dL (ref 6.0–8.3)

## 2022-09-13 LAB — CBC WITH DIFFERENTIAL/PLATELET
Basophils Absolute: 0 10*3/uL (ref 0.0–0.1)
Basophils Relative: 0.5 % (ref 0.0–3.0)
Eosinophils Absolute: 0.3 10*3/uL (ref 0.0–0.7)
Eosinophils Relative: 5.2 % — ABNORMAL HIGH (ref 0.0–5.0)
HCT: 34.1 % — ABNORMAL LOW (ref 39.0–52.0)
Hemoglobin: 10.9 g/dL — ABNORMAL LOW (ref 13.0–17.0)
Lymphocytes Relative: 22.3 % (ref 12.0–46.0)
Lymphs Abs: 1.3 10*3/uL (ref 0.7–4.0)
MCHC: 32.1 g/dL (ref 30.0–36.0)
MCV: 83.8 fl (ref 78.0–100.0)
Monocytes Absolute: 0.7 10*3/uL (ref 0.1–1.0)
Monocytes Relative: 11.7 % (ref 3.0–12.0)
Neutro Abs: 3.5 10*3/uL (ref 1.4–7.7)
Neutrophils Relative %: 60.3 % (ref 43.0–77.0)
Platelets: 165 10*3/uL (ref 150.0–400.0)
RBC: 4.07 Mil/uL — ABNORMAL LOW (ref 4.22–5.81)
RDW: 23.1 % — ABNORMAL HIGH (ref 11.5–15.5)
WBC: 5.8 10*3/uL (ref 4.0–10.5)

## 2022-09-13 LAB — IRON: Iron: 54 ug/dL (ref 42–165)

## 2022-09-13 NOTE — Telephone Encounter (Signed)
Amy from [facility] called in to let Dr tower know that she was suppose to discharge patient  from [facility] PT  this therapy,due to conflict she will have to do the discharge next week.

## 2022-09-14 NOTE — Telephone Encounter (Signed)
Aware, thanks!

## 2022-09-18 DIAGNOSIS — I21A1 Myocardial infarction type 2: Secondary | ICD-10-CM | POA: Diagnosis not present

## 2022-09-18 DIAGNOSIS — D127 Benign neoplasm of rectosigmoid junction: Secondary | ICD-10-CM | POA: Diagnosis not present

## 2022-09-18 DIAGNOSIS — Z483 Aftercare following surgery for neoplasm: Secondary | ICD-10-CM | POA: Diagnosis not present

## 2022-09-18 DIAGNOSIS — D630 Anemia in neoplastic disease: Secondary | ICD-10-CM | POA: Diagnosis not present

## 2022-09-18 DIAGNOSIS — Z48812 Encounter for surgical aftercare following surgery on the circulatory system: Secondary | ICD-10-CM | POA: Diagnosis not present

## 2022-09-18 DIAGNOSIS — C182 Malignant neoplasm of ascending colon: Secondary | ICD-10-CM | POA: Diagnosis not present

## 2022-09-18 DIAGNOSIS — C186 Malignant neoplasm of descending colon: Secondary | ICD-10-CM | POA: Diagnosis not present

## 2022-09-18 DIAGNOSIS — D123 Benign neoplasm of transverse colon: Secondary | ICD-10-CM | POA: Diagnosis not present

## 2022-09-18 DIAGNOSIS — I2510 Atherosclerotic heart disease of native coronary artery without angina pectoris: Secondary | ICD-10-CM | POA: Diagnosis not present

## 2022-09-27 ENCOUNTER — Telehealth: Payer: Self-pay | Admitting: *Deleted

## 2022-09-27 NOTE — Progress Notes (Signed)
  Care Coordination   Note   09/27/2022 Name: Ryan Garcia MRN: [id number] DOB: 05-11-1934  Ryan Garcia is a 89 y.o. year old male who sees Tower, Wynelle Fanny, MD for primary care. I reached out to Hinton Rao by phone today to offer care coordination services.  [name] was given information about Care Coordination services today including:   The Care Coordination services include support from the care team which includes your Nurse Coordinator, Clinical Social Worker, or Pharmacist.  The Care Coordination team is here to help remove barriers to the health concerns and goals most important to you. Care Coordination services are voluntary, and the patient may decline or stop services at any time by request to their care team member.   Care Coordination Consent Status: Patient agreed to services and verbal consent obtained.   Follow up plan:  Telephone appointment with care coordination team member scheduled for:  10/11/2022  Encounter Outcome:  Pt. Scheduled   Julian Hy, [place] Direct Dial: [phone]

## 2022-10-02 ENCOUNTER — Inpatient Hospital Stay: Payer: Medicare HMO | Admitting: Nurse Practitioner

## 2022-10-02 NOTE — Progress Notes (Deleted)
[name]  [phone]) [phone] Fax:(336) [phone]   Name: Ryan Garcia Date: 10/02/2022 MRN: [id number]  DOB: [birth year]  Patient Care Team: Abner Greenspan, MD as PCP - General Stanford Breed, Denice Bors, MD as PCP - Cardiology (Cardiology) Leandrew Koyanagi, MD as Referring Physician (Ophthalmology) Stanford Breed Denice Bors, MD as Consulting Physician (Cardiology) Dasher, Rayvon Char, MD as Consulting Physician (Dermatology) Salomon Fick., MD as Referring Physician (Dentistry) Charlton Haws, [facility] as Pharmacist (Pharmacist)   I connected with Ryan Garcia on 10/02/22 at 10:00 AM EDT by phone and verified that I am speaking with the correct person using two identifiers.   I discussed the limitations, risks, security and privacy concerns of performing an evaluation and management service by telemedicine and the availability of in-person appointments. I also discussed with the patient that there may be a patient responsible charge related to this service. The patient expressed understanding and agreed to proceed.   Other persons participating in the visit and their role in the encounter: Orene Desanctis, spouse   Patient's location: home  Provider's location: [facility]   Chief Complaint: follow up of symptom management   INTERVAL HISTORY: Ryan Garcia is a 89 y.o. male with oncologic medical history including newly diagnosed ascending colon malignant neoplasm (07/2022) with a notable previous history of STEMI, previous skin cancer, A-fib, and uncontrolled type 2 diabetes.  Palliative ask to see for symptom management and goals of care.   SOCIAL HISTORY:     reports that he has never smoked. He has never been exposed to tobacco smoke. He has never used smokeless tobacco. He reports that he does not drink alcohol and does not use drugs.  ADVANCE DIRECTIVES:  Advanced directives on file outline Ryan Garcia (spouse) as spokesperson for the  patient should they not be able to communicate for themself.   CODE STATUS:   PAST MEDICAL HISTORY: Past Medical History:  Diagnosis Date   AC (acromioclavicular) joint bone spurs    lt ankle   Allergy    allergic rhinitis   Anginal pain (HCC)    Arthritis    OA   BPH (benign prostatic hyperplasia)    microwave tx of prostate   CAD (coronary artery disease)    a. s/p cath in 2012 showing 70-75% LAD stenosis, 75% D1 stenosis, 75% OM1, 60-70% RCA stenosis, and 80% PDA --> medical therapy pursued b. similar findings by cath in 2016; 06/2017 DES to distal RCA   Chronic kidney disease    Chronic upper back pain    Diabetes mellitus    type II x8 years   GERD (gastroesophageal reflux disease)    HLD (hyperlipidemia)    10/12: TC 208, TG 213, HDL 36, LDL 129   Hx of skin cancer, basal cell    many skin cancers removed/under constant treatment.   Hypertension    NSTEMI (non-ST elevated myocardial infarction) ([place]) 06/18/2017   DES to distal RCA   Obstructive sleep apnea on CPAP    Paroxysmal atrial flutter (HCC)    Renal mass    Scoliosis     ALLERGIES:  is allergic to loratadine and simvastatin.  MEDICATIONS:  Current Outpatient Medications  Medication Sig Dispense Refill   acetaminophen (TYLENOL) 325 MG tablet Take 325-650 mg by mouth every 6 (six) hours as needed for mild pain or headache.     Alcohol Swabs (B-D SINGLE USE SWABS REGULAR) PADS Use to check blood sugar 2 times  a day 200 each 3   apixaban (ELIQUIS) 5 MG TABS tablet Take 1 tablet (5 mg total) by mouth 2 (two) times daily. 180 tablet 1   Blood Glucose Calibration (TRUE METRIX LEVEL 1) Low SOLN Use to check control on glucose meter 1 each 3   Blood Glucose Monitoring Suppl (TRUE METRIX AIR GLUCOSE METER) w/Device KIT 1 kit by Other route 2 (two) times daily. Check blood sugar twice daily and as directed. Dx E11.65 1 kit 0   cetirizine (ZYRTEC) 10 MG tablet Take 10 mg by mouth daily.     clopidogrel (PLAVIX) 75 MG  tablet Take 1 tablet (75 mg total) by mouth daily. 90 tablet 3   Coenzyme Q10 (CO Q 10 PO) Take 200 mg by mouth daily.     Cyanocobalamin (VITAMIN B12) 1000 MCG TBCR Take 1,000 mcg by mouth daily. 30 tablet    DROPLET PEN NEEDLES 31G X 6 MM MISC USE ONE TIME DAILY  AT  BEDTIME  WITH  LANTUS 100 each 3   fluticasone (FLONASE) 50 MCG/ACT nasal spray Place 1 spray into both nostrils daily as needed for allergies.      furosemide (LASIX) 20 MG tablet Take 20 mg by mouth daily.     insulin glargine (LANTUS SOLOSTAR) 100 UNIT/ML Solostar Pen Inject 50 Units into the skin at bedtime. (Patient taking differently: Inject 40 Units into the skin at bedtime.) 45 mL 3   isosorbide mononitrate (IMDUR) 60 MG 24 hr tablet TAKE 1 TABLET EVERY DAY 90 tablet 1   metFORMIN (GLUCOPHAGE) 1000 MG tablet TAKE 1 TABLET TWICE DAILY WITH MEALS 180 tablet 2   metoprolol tartrate (LOPRESSOR) 25 MG tablet Take 0.5 tablets (12.5 mg total) by mouth 2 (two) times daily. 90 tablet 3   nitroGLYCERIN (NITROSTAT) 0.4 MG SL tablet Place 1 tablet (0.4 mg total) under the tongue every 5 (five) minutes as needed for chest pain. 25 tablet 3   NOVOLOG FLEXPEN 100 UNIT/ML FlexPen Inject 16 Units into the skin daily. Before dinner     pantoprazole (PROTONIX) 40 MG tablet Take 1 tablet (40 mg total) by mouth daily at 6 (six) AM. 30 tablet 1   rosuvastatin (CRESTOR) 40 MG tablet TAKE 1 TABLET EVERY DAY 90 tablet 3   TRUE METRIX BLOOD GLUCOSE TEST test strip CHECK BLOOD SUGAR TWICE DAILY 200 strip 3   TRUEplus Lancets 30G MISC Check blood sugar twice daily and as directed. Dx E11.65 200 each 3   No current facility-administered medications for this visit.    VITAL SIGNS: There were no vitals taken for this visit. There were no vitals filed for this visit.  Estimated body mass index is 35.46 kg/m as calculated from the following:   Height as of 09/12/22: 5\' 11"  (1.803 m).   Weight as of 09/12/22: 254 lb 4 oz (115.3 kg).   PERFORMANCE  STATUS (ECOG) : 1 - Symptomatic but completely ambulatory   Physical Exam General: NAD Cardiovascular: regular rate and rhythm Pulmonary: clear ant fields Abdomen: soft, nontender, + bowel sounds Extremities: no edema, no joint deformities Skin: no rashes Neurological:   IMPRESSION: ***  We discussed His current illness and what it means in the larger context of His on-going co-morbidities. Natural disease trajectory and expectations were discussed.  I discussed the importance of continued conversation with family and their medical providers regarding overall plan of care and treatment options, ensuring decisions are within the context of the patients values and GOCs.  PLAN: Established  therapeutic relationship. Education provided on palliative's role in collaboration with their Oncology/Radiation team. No symptom management needs at this time. Education provided on increased protein and ways to increase appetite. Ongoing goals of care discussions and support as needed. Patient and wife would like to maintain contact in the future for ongoing support. I will plan to see patient back in 6-8 weeks in collaboration to other oncology appointments.  He knows to contact our office if needed sooner.   Patient expressed understanding and was in agreement with this plan. He also understands that He can call the clinic at any time with any questions, concerns, or complaints.   Any controlled substances utilized were prescribed in the context of palliative care. PDMP has been reviewed.    Visit consisted of counseling and education dealing with the complex and emotionally intense issues of symptom management and palliative care in the setting of serious and potentially life-threatening illness.Greater than 50%  of this time was spent counseling and coordinating care related to the above assessment and plan.  Alda Lea, AGPCNP-BC  Palliative Medicine Team/[place]  *Please note that this is a verbal dictation therefore any spelling or grammatical errors are due to the "[employer]" system interpretation.

## 2022-10-05 NOTE — Progress Notes (Signed)
err

## 2022-10-19 ENCOUNTER — Ambulatory Visit: Payer: Self-pay

## 2022-10-19 NOTE — Patient Outreach (Signed)
  Care Coordination   10/19/2022 Name: Ryan Garcia MRN: [id number] DOB: September 05, 1933   Care Coordination Outreach Attempts:  An unsuccessful telephone outreach was attempted for a scheduled appointment today. Telephone call attempt to patient / spouse home and mobile number.  Unable to reach. HIPAA compliant message left on both home and mobile voice mail.   Follow Up Plan:  Additional outreach attempts will be made to offer the patient care coordination information and services.   Encounter Outcome:  No Answer   Care Coordination Interventions:  No, not indicated    George Ina [facility] Care Coordination [phone] direct line

## 2022-10-30 ENCOUNTER — Telehealth: Payer: Self-pay | Admitting: *Deleted

## 2022-10-30 NOTE — Progress Notes (Unsigned)
  Care Coordination Note  10/30/2022 Name: Ryan Garcia MRN: [id number] DOB: 01/23/1934  Ryan Garcia is a 89 y.o. year old male who is a primary care patient of Tower, Audrie Gallus, MD and is actively engaged with the care management team. I reached out to Atha Starks by phone today to assist with re-scheduling an initial visit with the RN Case Manager  Follow up plan: Unsuccessful telephone outreach attempt made. A HIPAA compliant phone message was left for the patient providing contact information and requesting a return call.   Burman Nieves, CCMA Care Coordination Care Guide Direct Dial: [phone]

## 2022-10-31 NOTE — Progress Notes (Signed)
  Care Coordination Note  10/31/2022 Name: Ryan Garcia MRN: [id number] DOB: 05-Oct-1933  Ryan Garcia is a 89 y.o. year old male who is a primary care patient of Tower, Audrie Gallus, MD and is actively engaged with the care management team. I reached out to Atha Starks by phone today to assist with re-scheduling an initial visit with the RN Case Manager  Follow up plan: Telephone appointment with care management team member scheduled for: 11/02/2022  Burman Nieves, [facility] Care Coordination Care Guide Direct Dial: [phone]

## 2022-11-02 ENCOUNTER — Ambulatory Visit: Payer: Self-pay

## 2022-11-02 NOTE — Patient Outreach (Signed)
  Care Coordination   11/02/2022 Name: Ryan Garcia MRN: [id number] DOB: September 28, 1933   Care Coordination Outreach Attempts:  An unsuccessful telephone outreach was attempted for a scheduled appointment today.  Attempted call to listed home and mobile number.   Unable to reach.  HIPAA compliant voice message left  on both home and mobile numbers with call back phone number.   Follow Up Plan:  Additional outreach attempts will be made to offer the patient care coordination information and services.   Encounter Outcome:  No Answer   Care Coordination Interventions:  No, not indicated    George Ina [facility] Care Coordination [phone] direct line

## 2022-11-06 ENCOUNTER — Telehealth: Payer: Self-pay | Admitting: *Deleted

## 2022-11-06 NOTE — Progress Notes (Signed)
  Care Coordination Note  11/06/2022 Name: Ryan Garcia MRN: [id number] DOB: 03-18-1934  Ryan Garcia is a 89 y.o. year old male who is a primary care patient of Tower, Audrie Gallus, MD and is actively engaged with the care management team. I reached out to Atha Starks by phone today to assist with re-scheduling an initial visit with the RN Case Manager  Follow up plan: Unsuccessful telephone outreach attempt made. A HIPAA compliant phone message was left for the patient providing contact information and requesting a return call.   Burman Nieves, CCMA Care Coordination Care Guide Direct Dial: [phone]

## 2022-11-07 DIAGNOSIS — D2271 Melanocytic nevi of right lower limb, including hip: Secondary | ICD-10-CM | POA: Diagnosis not present

## 2022-11-07 DIAGNOSIS — D0439 Carcinoma in situ of skin of other parts of face: Secondary | ICD-10-CM | POA: Diagnosis not present

## 2022-11-07 DIAGNOSIS — L570 Actinic keratosis: Secondary | ICD-10-CM | POA: Diagnosis not present

## 2022-11-07 DIAGNOSIS — D225 Melanocytic nevi of trunk: Secondary | ICD-10-CM | POA: Diagnosis not present

## 2022-11-07 DIAGNOSIS — D2272 Melanocytic nevi of left lower limb, including hip: Secondary | ICD-10-CM | POA: Diagnosis not present

## 2022-11-07 DIAGNOSIS — D2261 Melanocytic nevi of right upper limb, including shoulder: Secondary | ICD-10-CM | POA: Diagnosis not present

## 2022-11-07 DIAGNOSIS — D2262 Melanocytic nevi of left upper limb, including shoulder: Secondary | ICD-10-CM | POA: Diagnosis not present

## 2022-11-07 DIAGNOSIS — D485 Neoplasm of uncertain behavior of skin: Secondary | ICD-10-CM | POA: Diagnosis not present

## 2022-11-13 ENCOUNTER — Telehealth: Payer: Self-pay

## 2022-11-13 NOTE — Progress Notes (Signed)
Care Management & Coordination Services Pharmacy Team  Reason for Encounter: Diabetes  Contacted patient to discuss diabetes disease state. Unsuccessful outreach. Left voicemail for patient to return call.       Hospital visits:  Medication Reconciliation was completed by comparing discharge summary, patient's EMR and Pharmacy list, and upon discussion with patient.  08/17/22 thru 08/19/22- [facility]- STEMI- discharged home  start plavix  Admitted to the hospital on 07/30/22 due to NSTEMI . Discharge date was 08/11/22. Discharged from [facility].    New?Medications Started at [facility] Discharge:?? -started pantoprazole   Medications that remain the same after Hospital Discharge:??  -All other medications will remain the same.    Medications: Outpatient Encounter Medications as of 11/13/2022  Medication Sig   acetaminophen (TYLENOL) 325 MG tablet Take 325-650 mg by mouth every 6 (six) hours as needed for mild pain or headache.   Alcohol Swabs (B-D SINGLE USE SWABS REGULAR) PADS Use to check blood sugar 2 times a day   apixaban (ELIQUIS) 5 MG TABS tablet Take 1 tablet (5 mg total) by mouth 2 (two) times daily.   Blood Glucose Calibration (TRUE METRIX LEVEL 1) Low SOLN Use to check control on glucose meter   Blood Glucose Monitoring Suppl (TRUE METRIX AIR GLUCOSE METER) w/Device KIT 1 kit by Other route 2 (two) times daily. Check blood sugar twice daily and as directed. Dx E11.65   cetirizine (ZYRTEC) 10 MG tablet Take 10 mg by mouth daily.   clopidogrel (PLAVIX) 75 MG tablet Take 1 tablet (75 mg total) by mouth daily.   Coenzyme Q10 (CO Q 10 PO) Take 200 mg by mouth daily.   Cyanocobalamin (VITAMIN B12) 1000 MCG TBCR Take 1,000 mcg by mouth daily.   DROPLET PEN NEEDLES 31G X 6 MM MISC USE ONE TIME DAILY  AT  BEDTIME  WITH  LANTUS   fluticasone (FLONASE) 50 MCG/ACT nasal spray Place 1 spray into both nostrils daily as needed for allergies.    furosemide (LASIX) 20 MG  tablet Take 20 mg by mouth daily.   insulin glargine (LANTUS SOLOSTAR) 100 UNIT/ML Solostar Pen Inject 50 Units into the skin at bedtime. (Patient taking differently: Inject 40 Units into the skin at bedtime.)   isosorbide mononitrate (IMDUR) 60 MG 24 hr tablet TAKE 1 TABLET EVERY DAY   metFORMIN (GLUCOPHAGE) 1000 MG tablet TAKE 1 TABLET TWICE DAILY WITH MEALS   metoprolol tartrate (LOPRESSOR) 25 MG tablet Take 0.5 tablets (12.5 mg total) by mouth 2 (two) times daily.   nitroGLYCERIN (NITROSTAT) 0.4 MG SL tablet Place 1 tablet (0.4 mg total) under the tongue every 5 (five) minutes as needed for chest pain.   NOVOLOG FLEXPEN 100 UNIT/ML FlexPen Inject 16 Units into the skin daily. Before dinner   pantoprazole (PROTONIX) 40 MG tablet Take 1 tablet (40 mg total) by mouth daily at 6 (six) AM.   rosuvastatin (CRESTOR) 40 MG tablet TAKE 1 TABLET EVERY DAY   TRUE METRIX BLOOD GLUCOSE TEST test strip CHECK BLOOD SUGAR TWICE DAILY   TRUEplus Lancets 30G MISC Check blood sugar twice daily and as directed. Dx E11.65   No facility-administered encounter medications on file as of 11/13/2022.    Recent Relevant Labs: Lab Results  Component Value Date/Time   HGBA1C 7.8 (H) 07/31/2022 04:47 AM   HGBA1C 8.0 (H) 10/11/2020 10:11 PM   MICROALBUR 3.3 (H) 05/09/2010 08:21 AM   MICROALBUR 1.8 11/21/2007 08:46 AM    Kidney Function Lab Results  Component  Value Date/Time   CREATININE 1.22 09/12/2022 04:25 PM   CREATININE 1.26 08/22/2022 03:48 PM   CREATININE 1.31 (H) 08/17/2022 03:02 PM   GFR 53.66 (L) 09/12/2022 04:25 PM   GFRNONAA >60 08/18/2022 05:43 AM   GFRAA >60 07/03/2018 03:04 AM    Star Rating Drugs:  Medication:  Last Fill: Day Supply Lantus   09/12/22 90 Metformin 1000mg  06/28/22 90  Mail order Novolog  10/19/22 90 Glipizide 10mg  08/16/22 90  Mail order Rosuvastatin 40mg  09/14/22 90   Care Gaps: Annual wellness visit in last year? Yes Last eye exam / retinopathy screening:UTD Last  diabetic foot exam:2020   Al Corpus, PharmD notified  Burt Knack, [facility] Clinical Pharmacy Assistant [phone]

## 2022-11-23 NOTE — Progress Notes (Signed)
HPI: FU CAD.  Cardiac catheterization in the past has revealed significant three-vessel coronary artery disease.  I reviewed films with Dr. Antoine Poche and Dr. Riley Kill and we felt medical therapy best option (pt also wanted to avoid CABG). Patient admitted December 2018 and ruled in for non-ST elevation myocardial infarction.  Patient underwent repeat catheterization revealing severe three-vessel coronary disease.  Left-sided disease was unchanged compared to previous catheterizations and the culprit was felt to be a new RCA lesion and he had PCI with DES. Patient underwent cardiac catheterization February 2024 in setting of acute inferior myocardial infarction which showed severe three-vessel coronary artery disease.  Patient had PCI of the RCA with drug-eluting stent.  Echocardiogram February 2024 showed normal LV function, mild left ventricular hypertrophy and mildly dilated ascending aorta at 39 mm.  Since last seen, the patient has dyspnea with more extreme activities but not with routine activities. It is relieved with rest. It is not associated with chest pain. There is no orthopnea, PND or pedal edema. There is no syncope or palpitations. There is no exertional chest pain.   Current Outpatient Medications  Medication Sig Dispense Refill   acetaminophen (TYLENOL) 325 MG tablet Take 325-650 mg by mouth every 6 (six) hours as needed for mild pain or headache.     Alcohol Swabs (B-D SINGLE USE SWABS REGULAR) PADS Use to check blood sugar 2 times a day 200 each 3   apixaban (ELIQUIS) 5 MG TABS tablet TAKE 1 TABLET TWICE DAILY 180 tablet 1   Blood Glucose Calibration (TRUE METRIX LEVEL 1) Low SOLN Use to check control on glucose meter 1 each 3   Blood Glucose Monitoring Suppl (TRUE METRIX AIR GLUCOSE METER) w/Device KIT 1 kit by Other route 2 (two) times daily. Check blood sugar twice daily and as directed. Dx E11.65 1 kit 0   cetirizine (ZYRTEC) 10 MG tablet Take 10 mg by mouth daily.      clopidogrel (PLAVIX) 75 MG tablet Take 1 tablet (75 mg total) by mouth daily. 90 tablet 3   Coenzyme Q10 (CO Q 10 PO) Take 200 mg by mouth daily.     Cyanocobalamin (VITAMIN B12) 1000 MCG TBCR Take 1,000 mcg by mouth daily. 30 tablet    DROPLET PEN NEEDLES 31G X 6 MM MISC USE ONE TIME DAILY  AT  BEDTIME  WITH  LANTUS 100 each 3   fluticasone (FLONASE) 50 MCG/ACT nasal spray Place 1 spray into both nostrils daily as needed for allergies.      furosemide (LASIX) 20 MG tablet Take 20 mg by mouth daily.     insulin glargine (LANTUS SOLOSTAR) 100 UNIT/ML Solostar Pen Inject 50 Units into the skin at bedtime. (Patient taking differently: Inject 40 Units into the skin at bedtime.) 45 mL 3   isosorbide mononitrate (IMDUR) 60 MG 24 hr tablet TAKE 1 TABLET EVERY DAY 90 tablet 1   metFORMIN (GLUCOPHAGE) 1000 MG tablet TAKE 1 TABLET TWICE DAILY WITH MEALS 180 tablet 2   metoprolol tartrate (LOPRESSOR) 25 MG tablet Take 0.5 tablets (12.5 mg total) by mouth 2 (two) times daily. 90 tablet 3   nitroGLYCERIN (NITROSTAT) 0.4 MG SL tablet Place 1 tablet (0.4 mg total) under the tongue every 5 (five) minutes as needed for chest pain. 25 tablet 3   NOVOLOG FLEXPEN 100 UNIT/ML FlexPen Inject 16 Units into the skin daily. Before dinner     rosuvastatin (CRESTOR) 40 MG tablet TAKE 1 TABLET EVERY DAY 90 tablet 3  TRUE METRIX BLOOD GLUCOSE TEST test strip CHECK BLOOD SUGAR TWICE DAILY 200 strip 3   TRUEplus Lancets 30G MISC Check blood sugar twice daily and as directed. Dx E11.65 200 each 3   No current facility-administered medications for this visit.     Past Medical History:  Diagnosis Date   AC (acromioclavicular) joint bone spurs    lt ankle   Allergy    allergic rhinitis   Anginal pain (HCC)    Arthritis    OA   BPH (benign prostatic hyperplasia)    microwave tx of prostate   CAD (coronary artery disease)    a. s/p cath in 2012 showing 70-75% LAD stenosis, 75% D1 stenosis, 75% OM1, 60-70% RCA stenosis,  and 80% PDA --> medical therapy pursued b. similar findings by cath in 2016; 06/2017 DES to distal RCA   Chronic kidney disease    Chronic upper back pain    Diabetes mellitus    type II x8 years   GERD (gastroesophageal reflux disease)    HLD (hyperlipidemia)    10/12: TC 208, TG 213, HDL 36, LDL 129   Hx of skin cancer, basal cell    many skin cancers removed/under constant treatment.   Hypertension    NSTEMI (non-ST elevated myocardial infarction) (HCC) 06/18/2017   DES to distal RCA   Obstructive sleep apnea on CPAP    Paroxysmal atrial flutter (HCC)    Renal mass    Scoliosis     Past Surgical History:  Procedure Laterality Date   BIOPSY  08/01/2022   Procedure: BIOPSY;  Surgeon: Beverley Fiedler, MD;  Location[facility] END[facility];  Service: Gastroenterology;;   BREAST SURGERY  2009   breast lump removed benign   BUBBLE STUDY  10/14/2020   Procedure: BUBBLE STUDY;  Surgeon: Parke Poisson, MD;  Location: [facility] ENDOSCOPY;  Service: Cardiovascular;;   CARDIAC CATHETERIZATION N/A 03/15/2015   Procedure: Left Heart Cath and Coronary Angiography;  Surgeon: Lyn Records, MD;  Location: [facility] INVASIVE CV LAB;  Service: Cardiovascular;  Laterality: N/A;   CARDIOVERSION N/A 10/14/2020   Procedure: CARDIOVERSION;  Surgeon: Parke Poisson, MD;  Location: [facility] ENDOSCOPY;  Service: Cardiovascular;  Laterality: N/A;   CATARACT EXTRACTION W/PHACO Right 11/29/2021   Procedure: CATARACT EXTRACTION PHACO AND INTRAOCULAR LENS PLACEMENT (IOC) RIGHT DIABETIC;  Surgeon: Lockie Mola, MD;  Location: [facility]ERY [facility];  Service: Ophthalmology;  Laterality: Right;  Diabetic 18.72 01:54.3   COLONOSCOPY N/A 08/01/2022   Procedure: COLONOSCOPY;  Surgeon: Beverley Fiedler, MD;  Location: [facility] ENDOSCOPY;  Service: Gastroenterology;  Laterality: N/A;   CORONARY STENT INTERVENTION N/A 06/18/2017   Procedure: CORONARY STENT INTERVENTION;  Surgeon: Corky Crafts, MD;  Location: [facility] INVASIVE CV LAB;  Service:  Cardiovascular;  Laterality: N/A;  RCA   CORONARY/GRAFT ACUTE MI REVASCULARIZATION N/A 08/17/2022   Procedure: Coronary/Graft Acute MI Revascularization;  Surgeon: Alwyn Pea, MD;  Location: [facility] INVASIVE CV LAB;  Service: Cardiovascular;  Laterality: N/A;   ESOPHAGOGASTRODUODENOSCOPY (EGD) WITH PROPOFOL N/A 08/01/2022   Procedure: ESOPHAGOGASTRODUODENOSCOPY (EGD) WITH PROPOFOL;  Surgeon: Beverley Fiedler, MD;  Location: [facility] ENDOSCOPY;  Service: Gastroenterology;  Laterality: N/A;   HEMOSTASIS CLIP PLACEMENT  08/01/2022   Procedure: HEMOSTASIS CLIP PLACEMENT;  Surgeon: Beverley Fiedler, MD;  Location: [facility] ENDOSCOPY;  Service: Gastroenterology;;   LAPAROSCOPIC LYSIS OF ADHESIONS N/A 08/03/2022   Procedure: LAPAROSCOPIC LYSIS OF ADHESIONS;  Surgeon: Griselda Miner, MD;  Location: [facility] OR;  Service: General;  Laterality: N/A;  LAPAROSCOPIC PARTIAL COLECTOMY N/A 08/03/2022   Procedure: OPEN PARTIAL COLECTOMY;  Surgeon: Griselda Miner, MD;  Location: [facility] OR;  Service: General;  Laterality: N/A;   LEFT HEART CATH AND CORONARY ANGIOGRAPHY N/A 06/18/2017   Procedure: LEFT HEART CATH AND CORONARY ANGIOGRAPHY;  Surgeon: Corky Crafts, MD;  Location: [facility] INVASIVE CV LAB;  Service: Cardiovascular;  Laterality: N/A;   LEFT HEART CATH AND CORONARY ANGIOGRAPHY N/A 04/12/2020   Procedure: LEFT HEART CATH AND CORONARY ANGIOGRAPHY;  Surgeon: Swaziland, Peter M, MD;  Location: [facility] INVASIVE CV LAB;  Service: Cardiovascular;  Laterality: N/A;   LEFT HEART CATH AND CORONARY ANGIOGRAPHY N/A 08/17/2022   Procedure: LEFT HEART CATH AND CORONARY ANGIOGRAPHY;  Surgeon: Alwyn Pea, MD;  Location: [facility] INVASIVE CV LAB;  Service: Cardiovascular;  Laterality: N/A;   POLYPECTOMY  08/01/2022   Procedure: POLYPECTOMY;  Surgeon: Beverley Fiedler, MD;  Location: [facility] ENDOSCOPY;  Service: Gastroenterology;;   SUBMUCOSAL TATTOO INJECTION  08/01/2022   Procedure: SUBMUCOSAL TATTOO INJECTION;  Surgeon: Beverley Fiedler, MD;  Location: [facility]  ENDOSCOPY;  Service: Gastroenterology;;   TEE WITHOUT CARDIOVERSION N/A 10/14/2020   Procedure: TRANSESOPHAGEAL ECHOCARDIOGRAM (TEE);  Surgeon: Parke Poisson, MD;  Location: [facility] ENDOSCOPY;  Service: Cardiovascular;  Laterality: N/A;   ULTRASOUND GUIDANCE FOR VASCULAR ACCESS  06/18/2017   Procedure: Ultrasound Guidance For Vascular Access;  Surgeon: Corky Crafts, MD;  Location: [facility] INVASIVE CV LAB;  Service: Cardiovascular;;  right radial, right femoral    Social History   Socioeconomic History   Marital status: Married    Spouse name: Klayton Levatino   Number of children: 3   Years of education: Not on file   Highest education level: Not on file  Occupational History   Not on file  Tobacco Use   Smoking status: Never    Passive exposure: Never   Smokeless tobacco: Never  Vaping Use   Vaping Use: Never used  Substance and Sexual Activity   Alcohol use: No    Alcohol/week: 0.0 standard drinks of alcohol   Drug use: No   Sexual activity: Not Currently  Other Topics Concern   Not on file  Social History Narrative   Married    Physically active hard of hearing   Social Determinants of Health   Financial Resource Strain: Low Risk  (06/29/2021)   Overall Financial Resource Strain (CARDIA)    Difficulty of Paying Living Expenses: Not hard at all  Food Insecurity: No Food Insecurity (08/24/2022)   Hunger Vital Sign    Worried About Running Out of Food in the Last Year: Never true    Ran Out of Food in the Last Year: Never true  Transportation Needs: No Transportation Needs (08/24/2022)   PRAPARE - Administrator, Civil Service (Medical): No    Lack of Transportation (Non-Medical): No  Physical Activity: Inactive (06/29/2021)   Exercise Vital Sign    Days of Exercise per Week: 0 days    Minutes of Exercise per Session: 0 min  Stress: No Stress Concern Present (06/29/2021)   [employer] of Occupational Health - Occupational Stress Questionnaire     Feeling of Stress : Not at all  Social Connections: Moderately Integrated (06/29/2021)   Social Connection and Isolation Panel [NHANES]    Frequency of Communication with Friends and Family: More than three times a week    Frequency of Social Gatherings with Friends and Family: More than three times a week    Attends Religious Services:  More than 4 times per year    Active Member of Clubs or Organizations: No    Attends Banker Meetings: Never    Marital Status: Married  Catering manager Violence: Not At Risk (08/24/2022)   Humiliation, Afraid, Rape, and Kick questionnaire    Fear of Current or Ex-Partner: No    Emotionally Abused: No    Physically Abused: No    Sexually Abused: No    Family History  Problem Relation Age of Onset   Heart attack Brother    Colon cancer Neg Hx    Colon polyps Neg Hx    Stomach cancer Neg Hx    Esophageal cancer Neg Hx     ROS: no fevers or chills, productive cough, hemoptysis, dysphasia, odynophagia, melena, hematochezia, dysuria, hematuria, rash, seizure activity, orthopnea, PND, pedal edema, claudication. Remaining systems are negative.  Physical Exam: Well-developed well-nourished in no acute distress.  Skin is warm and dry.  HEENT is normal.  Neck is supple.  Chest is clear to auscultation with normal expansion.  Cardiovascular exam is regular rate and rhythm.  Abdominal exam nontender or distended. No masses palpated. Extremities show no edema. neuro grossly intact   A/P  1 coronary artery disease-patient denies chest pain.  He does not wish to pursue coronary artery bypass graft as outlined in previous notes.  We will therefore continue medical therapy.  Continue plavix and statin.  2 history of paroxysmal atrial flutter-patient remains in sinus rhythm.  Continue apixaban and metoprolol.  3 hypertension-blood pressure controlled.  Continue present medical regimen.  4 hyperlipidemia-continue statin.  5 chronic  diastolic just of heart failure-he remains euvolemic.  Will continue diuretic at present dose.  6 obstructive sleep apnea-continue CPAP.  7 recently diagnosed adenocarcinoma of the colon status postresection February 2024 and right renal mass concerning for renal cell carcinoma-Per oncology.  Olga Millers, MD

## 2022-11-28 ENCOUNTER — Other Ambulatory Visit: Payer: Self-pay | Admitting: Cardiology

## 2022-11-28 NOTE — Telephone Encounter (Signed)
Pt last saw Edd Fabian, NP on 08/23/22, last labs 09/12/22 Creat 1.22, age 89, weight 115.3kg, based on specified criteria pt is on appropriate dosage of Eliquis 5mg  BID for aflutter.  Will refill rx.

## 2022-11-29 NOTE — Progress Notes (Signed)
  Care Coordination Note  11/29/2022 Name: Ryan Garcia MRN: [id number] DOB: 01-May-1934  Ryan Garcia is a 89 y.o. year old male who is a primary care patient of Tower, Audrie Gallus, MD and is actively engaged with the care management team. I reached out to Atha Starks by phone today to assist with re-scheduling an initial visit with the RN Case Manager  Follow up plan: We have been unable to make contact with the patient for follow up.   Burman Nieves, CCMA Care Coordination Care Guide Direct Dial: [phone]

## 2022-11-30 ENCOUNTER — Encounter: Payer: Self-pay | Admitting: Cardiology

## 2022-11-30 ENCOUNTER — Ambulatory Visit: Payer: Medicare HMO | Attending: Cardiology | Admitting: Cardiology

## 2022-11-30 VITALS — BP 141/71 | HR 66 | Ht 71.0 in | Wt 245.0 lb

## 2022-11-30 DIAGNOSIS — I2510 Atherosclerotic heart disease of native coronary artery without angina pectoris: Secondary | ICD-10-CM

## 2022-11-30 DIAGNOSIS — I10 Essential (primary) hypertension: Secondary | ICD-10-CM

## 2022-11-30 DIAGNOSIS — I4892 Unspecified atrial flutter: Secondary | ICD-10-CM | POA: Diagnosis not present

## 2022-11-30 DIAGNOSIS — E7800 Pure hypercholesterolemia, unspecified: Secondary | ICD-10-CM

## 2022-11-30 NOTE — Patient Instructions (Signed)
    Follow-Up: At [facility], you and your health needs are our priority.  As part of our continuing mission to provide you with exceptional heart care, we have created designated Provider Care Teams.  These Care Teams include your primary Cardiologist (physician) and Advanced Practice Providers (APPs -  Physician Assistants and Nurse Practitioners) who all work together to provide you with the care you need, when you need it.  We recommend signing up for the patient portal called "MyChart".  Sign up information is provided on this After Visit Summary.  MyChart is used to connect with patients for Virtual Visits (Telemedicine).  Patients are able to view lab/test results, encounter notes, upcoming appointments, etc.  Non-urgent messages can be sent to your provider as well.   To learn more about what you can do with MyChart, go to [email or web address].    Your next appointment:   6 month(s)  Provider:   Brian Crenshaw, MD      

## 2022-12-10 ENCOUNTER — Telehealth: Payer: Self-pay

## 2022-12-10 NOTE — Progress Notes (Signed)
Care Management & Coordination Services Pharmacy Team  Reason for Encounter: Appointment Reminder  Contacted patient to confirm telephone appointment with Al Corpus, PharmD, on 12/11/22 at 10:00. Unsuccessful outreach. Left voicemail for patient to return call.  Have you seen any other providers since your last visit with PCP? Yes- cardiology, general surgery, oncology    Hospital visits:  None in previous 6 months   Star Rating Drugs:  Medication:  Last Fill: Day Supply Lantus   09/12/22 90 Metformin 1000mg  06/28/22 90 Novolog  10/19/22 90 Rosuvastatin 40mg  09/14/22 90 Glipizide 10mg  08/16/22 90    [facility] mail order pharmacy Care Gaps: Annual wellness visit in last year? Yes  If Diabetic: Last eye exam / retinopathy screening:UTD Last diabetic foot exam:2020   Al Corpus, PharmD notified  Burt Knack, [facility] Clinical Pharmacy Assistant [phone]

## 2022-12-11 ENCOUNTER — Ambulatory Visit: Payer: Medicare HMO | Admitting: Pharmacist

## 2022-12-11 NOTE — Progress Notes (Unsigned)
Care Management & Coordination Services Pharmacy Note  12/11/2022 Name:  Ryan Garcia MRN:  [id number] DOB:  1933-07-23  Summary: F/U visit -DM: A1c 6.8% (08/2022), pt reports fasting BG typically < 125, post-prandial BG range 160-277; he denies low glucose since stopping glipizide a few months ago -HTN / HF: Pt is not monitoring BP at home often; BP was elevated today 150/69 -CAD: recent STEMI with PCI/DES 08/2022, he is on Plavix and Eliquis and denies any s/sx of bleeding  Recommendations/Changes made from today's visit: -Advised pt to monitor BP daily; call provider if consistently > 150/90  Follow up plan: -Oncology appt 12/25/22; Urology 08/22/23    Subjective: INOCENTE CLUM is an 89 y.o. year old male who is a primary patient of Tower, Audrie Gallus, MD.  The care coordination team was consulted for assistance with disease management and care coordination needs.    Engaged with patient by telephone for follow up visit.  Recent office visits: 09/12/22 Dr Milinda Antis OV: dizziness - BP 135/70. No UTI. Labs stable. No changes.  08/22/22 Dr Milinda Antis OV: hospital f/u - BMP, CBC, LFT stable. No changes.  Recent consult visits: 11/30/22 Dr Jens Som (Cardiology): BP 141/71. D/c pantoprazole.  09/11/22 Dr Tedd Sias (Endocrine): f/u DM - A1c 6.8%; stop glipizide. Reduce Lantus to 40 units HS. BP 152/52, P 49.  08/24/22 Dr Truett Perna (Oncology): new pt - colon cancer, stage III B. S/p colectomy 08/03/22. Good prognosis, no adjuvant treatment at this point.  08/23/22 Dr Richardo Hanks (Urology): renal mass (2021), LUTS. Continue surveillance. Avoid anticholinergics.  08/23/22 NP Edd Fabian (Cardiology): f/u STEMI, s/p PCI/DES. No changes.  Hospital visits: 08/17/22 - 08/19/22 Admission [facility]): STEMI - PCI/DES. Rec plavix, Eliquis, rosuvastatin, metoprolol. No triple therapy (ASA) d/t anemia.  07/30/22 - 08/11/22 Admission [facility]): NSTEMI, colon cancer Dx. Underwent colectomy 08/03/22. NSTEMI 2/2 demand ischemia in s/o anemia.  No invasive workup. Start pantoprazole daily.   Objective:  Lab Results  Component Value Date   CREATININE 1.22 09/12/2022   BUN 14 09/12/2022   GFR 53.66 (L) 09/12/2022   EGFR 57 (L) 12/06/2021   GFRNONAA >60 08/18/2022   GFRAA >60 07/03/2018   NA 139 09/12/2022   K 4.3 09/12/2022   CALCIUM 8.5 09/12/2022   CO2 27 09/12/2022   GLUCOSE 241 (H) 09/12/2022    Lab Results  Component Value Date/Time   HGBA1C 7.8 (H) 07/31/2022 04:47 AM   HGBA1C 8.0 (H) 10/11/2020 10:11 PM   GFR 53.66 (L) 09/12/2022 04:25 PM   GFR 51.65 (L) 08/22/2022 03:48 PM   MICROALBUR 3.3 (H) 05/09/2010 08:21 AM   MICROALBUR 1.8 11/21/2007 08:46 AM    Last diabetic Eye exam:  Lab Results  Component Value Date/Time   HMDIABEYEEXA Retinopathy (A) 07/12/2022 12:00 AM    Last diabetic Foot exam:  Lab Results  Component Value Date/Time   HMDIABFOOTEX yes 09/06/2010 12:00 AM     Lab Results  Component Value Date   CHOL 86 07/31/2022   HDL 29 (L) 07/31/2022   LDLCALC 38 07/31/2022   LDLDIRECT 78.5 11/17/2012   TRIG 95 07/31/2022   CHOLHDL 3.0 07/31/2022       Latest Ref Rng & Units 09/12/2022    4:25 PM 08/22/2022    3:48 PM 08/17/2022    3:02 PM  Hepatic Function  Total Protein 6.0 - 8.3 g/dL 6.3  6.9  6.3   Albumin 3.5 - 5.2 g/dL 3.4  3.5    AST 0 - 37 U/L 11  24  101   ALT 0 - 53 U/L 10  45  112   Alk Phosphatase 39 - 117 U/L 84  132    Total Bilirubin 0.2 - 1.2 mg/dL 0.5  0.4  0.3   Bilirubin, Direct 0.0 - 0.3 mg/dL  0.0      Lab Results  Component Value Date/Time   TSH 7.730 (H) 08/17/2022 05:06 PM   TSH 8.32 (H) 09/22/2021 08:45 AM   FREET4 0.68 09/22/2021 08:45 AM   FREET4 0.74 10/12/2020 08:54 AM       Latest Ref Rng & Units 09/12/2022    4:25 PM 08/22/2022    3:48 PM 08/18/2022    2:52 PM  CBC  WBC 4.0 - 10.5 K/uL 5.8  8.5    Hemoglobin 13.0 - 17.0 g/dL 84.6  96.2  8.9   Hematocrit 39.0 - 52.0 % 34.1  32.1  30.7   Platelets 150.0 - 400.0 K/uL 165.0  415.0      Lab  Results  Component Value Date/Time   VITAMINB12 692 07/31/2022 01:30 AM   VITAMINB12 470 01/18/2020 10:10 AM    Clinical ASCVD: Yes  The ASCVD Risk score (Arnett DK, et al., 2019) failed to calculate for the following reasons:   The 2019 ASCVD risk score is only valid for ages 64 to 60   The patient has a prior MI or stroke diagnosis    CHA2DS2/VAS Stroke Risk Points  Current as of yesterday     5 >= 2 Points: High Risk     Points Metrics  0 Has Congestive Heart Failure:  No    Current as of yesterday  1 Has Vascular Disease:  Yes    Current as of yesterday  1 Has Hypertension:  Yes    Current as of yesterday  2 Age:  89    Current as of yesterday  1 Has Diabetes:  Yes    Current as of yesterday  0 Had Stroke:  No  Had TIA:  No  Had Thromboembolism:  No    Current as of yesterday  0 Male:  No    Current as of yesterday        08/24/2022    3:31 PM 07/05/2022   10:48 AM 07/03/2022   10:39 AM  Depression screen PHQ 2/9  Decreased Interest 0 0 0  Down, Depressed, Hopeless 0 0 0  PHQ - 2 Score 0 0 0     Social History   Tobacco Use  Smoking Status Never   Passive exposure: Never  Smokeless Tobacco Never   BP Readings from Last 3 Encounters:  11/30/22 (!) 141/71  09/12/22 135/70  08/24/22 133/61   Pulse Readings from Last 3 Encounters:  11/30/22 66  09/12/22 62  08/24/22 63   Wt Readings from Last 3 Encounters:  11/30/22 245 lb (111.1 kg)  09/12/22 254 lb 4 oz (115.3 kg)  08/24/22 239 lb 3.2 oz (108.5 kg)   BMI Readings from Last 3 Encounters:  11/30/22 34.17 kg/m  09/12/22 35.46 kg/m  08/24/22 33.36 kg/m    Allergies  Allergen Reactions   Loratadine Other (See Comments)    Not effective   Simvastatin Other (See Comments)    Intolerant- caused joint pain    Medications Reviewed Today     Reviewed by Kathyrn Sheriff, RPH (Pharmacist) on 12/11/22 at 1031  Med List Status: <None>   Medication Order Taking? Sig Documenting Provider Last  Dose Status Informant  acetaminophen (TYLENOL) 325  MG tablet [id number] Yes Take 325-650 mg by mouth every 6 (six) hours as needed for mild pain or headache. [provider] Taking Active Self, Pharmacy Records  Alcohol Swabs (B-D SINGLE USE SWABS REGULAR) PADS [id number] Yes Use to check blood sugar 2 times a day Tower, Audrie Gallus, MD Taking Active Self, Pharmacy Records  apixaban (ELIQUIS) 5 MG TABS tablet [id number] Yes TAKE 1 TABLET TWICE DAILY Crenshaw, Madolyn Frieze, MD Taking Active   Blood Glucose Calibration (TRUE METRIX LEVEL 1) Low SOLN [id number] Yes Use to check control on glucose meter Tower, Audrie Gallus, MD Taking Active Self, Pharmacy Records  Blood Glucose Monitoring Suppl (TRUE METRIX AIR GLUCOSE METER) w/Device KIT [id number] Yes 1 kit by Other route 2 (two) times daily. Check blood sugar twice daily and as directed. Dx E11.65 Judy Pimple, MD Taking Active Self, Pharmacy Records  cetirizine (ZYRTEC) 10 MG tablet [id number] Yes Take 10 mg by mouth daily. [provider] Taking Active   clopidogrel (PLAVIX) 75 MG tablet [id number] Yes Take 1 tablet (75 mg total) by mouth daily. Tower, Audrie Gallus, MD Taking Active   Coenzyme Q10 (CO Q 10 PO) [id number] Yes Take 200 mg by mouth daily. [provider] Taking Active Self, Pharmacy Records  Cyanocobalamin (VITAMIN B12) 1000 MCG TBCR [id number] Yes Take 1,000 mcg by mouth daily. Arty Baumgartner, NP Taking Active Self, Pharmacy Records  DROPLET PEN NEEDLES 31G X 6 MM MISC [id number] Yes USE ONE TIME DAILY  AT  BEDTIME  WITH  LANTUS Tower, Audrie Gallus, MD Taking Active Self, Pharmacy Records  fluticasone [facility]) 50 MCG/ACT nasal spray [id number] Yes Place 1 spray into both nostrils daily as needed for allergies.  [provider] Taking Active Self, Pharmacy Records  furosemide (LASIX) 20 MG tablet [id number] Yes Take 20 mg by mouth daily. [provider] Taking Active Self, Pharmacy Records  insulin glargine (LANTUS  SOLOSTAR) 100 UNIT/ML Solostar Pen [id number] Yes Inject 50 Units into the skin at bedtime.  Patient taking differently: Inject 38 Units into the skin at bedtime.   Tower, Audrie Gallus, MD Taking Active Self, Pharmacy Records  isosorbide mononitrate (IMDUR) 60 MG 24 hr tablet [id number] Yes TAKE 1 TABLET EVERY DAY Crenshaw, Madolyn Frieze, MD Taking Active Self, Pharmacy Records  metFORMIN (GLUCOPHAGE) 1000 MG tablet [id number] Yes TAKE 1 TABLET TWICE DAILY WITH MEALS Tower, Audrie Gallus, MD Taking Active Self, Pharmacy Records  metoprolol tartrate (LOPRESSOR) 25 MG tablet [id number] Yes Take 0.5 tablets (12.5 mg total) by mouth 2 (two) times daily. Lewayne Bunting, MD Taking Active Self, Pharmacy Records  nitroGLYCERIN (NITROSTAT) 0.4 MG SL tablet [id number] Yes Place 1 tablet (0.4 mg total) under the tongue every 5 (five) minutes as needed for chest pain. Marcelino Duster, PA Taking Active Self, Pharmacy Records  NOVOLOG FLEXPEN 100 UNIT/ML FlexPen [id number] Yes Inject 16 Units into the skin daily. Before dinner [provider] Taking Active Self, Pharmacy Records  rosuvastatin (CRESTOR) 40 MG tablet [id number] Yes TAKE 1 TABLET EVERY DAY Jens Som Madolyn Frieze, MD Taking Active   TRUE METRIX BLOOD GLUCOSE TEST test strip [id number] Yes CHECK BLOOD SUGAR TWICE DAILY Tower, Audrie Gallus, MD Taking Active Self, Pharmacy Records  TRUEplus Lancets 30G [place] [id number] Yes Check blood sugar twice daily and as directed. Dx E11.65 Tower, Audrie Gallus, MD Taking Active Self, Pharmacy Records            SDOH:  (Social Determinants of Health) assessments and interventions performed: No SDOH Interventions  Flowsheet Row Office Visit from 08/24/2022 in [facility] Telephone from 08/20/2022 in [facility] Care Coordination ED to Hosp-Admission (Discharged) from 08/17/2022 in [facility] PCU Telephone from 08/15/2022 in [facility]  Coordination Chronic Care Management from 08/31/2020 in [facility] [place]  SDOH Interventions       Food Insecurity Interventions Intervention Not Indicated Intervention Not Indicated -- Intervention Not Indicated --  Housing Interventions Intervention Not Indicated Intervention Not Indicated Intervention Not Indicated Intervention Not Indicated --  Transportation Interventions Intervention Not Indicated Intervention Not Indicated -- Intervention Not Indicated --  Utilities Interventions Intervention Not Indicated -- -- -- --  Financial Strain Interventions -- -- -- -- Other (Comment)  [Apply for Lantus patient assistance]       Medication Assistance: None required.  Patient affirms current coverage meets needs.  Medication Access: Within the past 30 days, how often has patient missed a dose of medication? 0 Is a pillbox or other method used to improve adherence? Yes  Factors that may affect medication adherence? no barriers identified Are meds synced by current pharmacy? No  Are meds delivered by current pharmacy? Yes  Does patient experience delays in picking up medications due to transportation concerns?  Current Rx insurance plan: [employer] Name and location of Current pharmacy:  [facility] - [place], [place] - [address] [place] [address] Phone: [phone] Fax: [phone]  [id number] [place], [place] - [address] AT [facility] OF [address] [place] [address] Phone: [phone] Fax: [phone]  Compliance/Adherence/Medication fill history: Care Gaps: Foot exam (due 12/2019)  Star-Rating Drugs: Metformin - PDC 69%; LF 06/28/22 x 90 ds. Pt endorses compliance with BID dosing 12/11/22. He has full bottle. Rosuvastatin - PDC 100%   ASSESSMENT / PLAN  Diabetes (A1c goal < 7.5%) -Controlled- A1c 6.8% (08/2022) -Follows with [facility]  (Dr Tedd Sias) -Current home glucose readings: checks before breakfast and 2-4 hours after supper   Fasting: 159, usually <125  Evening: 238, 277, 206, 145, 165, 161, 170, 200, 121, 136, 216 -Current medications: Metformin 1000 mg BID w/ meals - Appropriate, Effective, Safe, Accessible Lantus 38 units HS - Appropriate, Effective, Query Safe Novolog 16 units w/ supper - Appropriate, Effective, Safe, Accessible -Medications previously tried: Ozempic (cost), glipizide  -Educated on A1c and blood sugar goals; Benefits of routine self-monitoring of blood sugar; -Recommended to continue current medication; keep regular f/u with endocrine  Hypertension / Heart Failure (BP goal < 140/90) -Not ideally controlled - pt is not checking BP at home regularly; BP was elevated today; he reports swelling in his feet (chronic, not new) and occasional shortness of breath when he wakes up in AM which resolves with a few deep breaths -Current home BP readings: today-150/69, P 61; 152/67, P 55 -Daily weights: not checking daily; 243# today -Last ejection fraction: 60-65% (Date: 08/2022) -HF type: HFpEF (EF > 50%) -NYHA Class: I-II -AHA HF Stage: C (Heart disease and symptoms present) -Current treatment: Metoprolol tartate 25 mg - 1/2 BID - Appropriate, Query Effective Isosorbide MN 60 mg daily AM - Appropriate, Query Effective Furosemide 20 mg daily - Appropriate, Query Effective -Medications previously tried: HCTZ, lisinopril (AKI) -Current exercise habits:  Mowing lawn (13 acres); cutting logs -Educated on Importance of home blood pressure monitoring; Proper BP monitoring technique; -Counseled to monitor BP  at home daily; contact provider if BP consistently > 150/90 -Recommended to continue current medication  Hyperlipidemia/CAD: (LDL goal < 70) -Controlled - LDL 38 (07/2022); pt reports he had cramps with statin previously and COQ10 has helped -Hx NSTEMI 06/2017; STEMI 08/2022 s/p PCI/DES; no aspirin d/t  Eliquis -Current treatment: Rosuvastatin 40 mg daily - Appropriate, Effective, Safe, Accessible Clopidogrel 75 mg daily  -Appropriate, Effective, Safe, Accessible Isosorbide MN 60 mg daily -Appropriate, Effective, Safe, Accessible CoQ10 daily - Appropriate, Effective, Safe, Accessible Nitroglycerin 0.4 mg SL prn - Appropriate, Effective, Safe, Accessible -Medications previously tried: Lipitor (myopathy), pravastatin, simvastatin -Educated on Benefits of statin for ASCVD risk reduction; -Recommended to continue current medication  Atrial Fibrillation (Goal: prevent stroke and major bleeding) -Controlled - per pt report -CHADSVASC: 5 -Current treatment: Metoprolol tartrate 25 mg - 1/2 tab BID - Appropriate, Effective, Safe, Accessible Eliquis 5 mg BID - Appropriate, Effective, Safe, Accessible  Health Maintenance -Vaccine gaps: None -OTC: cetirizine   Al Corpus, PharmD, [employer] Clinical Pharmacist [facility] [phone]

## 2022-12-18 DIAGNOSIS — E1142 Type 2 diabetes mellitus with diabetic polyneuropathy: Secondary | ICD-10-CM | POA: Diagnosis not present

## 2022-12-20 ENCOUNTER — Other Ambulatory Visit: Payer: Self-pay | Admitting: Cardiology

## 2022-12-21 ENCOUNTER — Encounter: Payer: Self-pay | Admitting: Internal Medicine

## 2022-12-21 ENCOUNTER — Emergency Department: Payer: Medicare HMO

## 2022-12-21 ENCOUNTER — Other Ambulatory Visit: Payer: Self-pay

## 2022-12-21 ENCOUNTER — Observation Stay
Admission: EM | Admit: 2022-12-21 | Discharge: 2022-12-22 | Disposition: A | Discharge status: Home / Self Care | Payer: Medicare HMO | Attending: Internal Medicine | Admitting: Internal Medicine

## 2022-12-21 DIAGNOSIS — K219 Gastro-esophageal reflux disease without esophagitis: Secondary | ICD-10-CM | POA: Diagnosis present

## 2022-12-21 DIAGNOSIS — R197 Diarrhea, unspecified: Secondary | ICD-10-CM

## 2022-12-21 DIAGNOSIS — I4892 Unspecified atrial flutter: Secondary | ICD-10-CM | POA: Diagnosis present

## 2022-12-21 DIAGNOSIS — I2510 Atherosclerotic heart disease of native coronary artery without angina pectoris: Secondary | ICD-10-CM | POA: Diagnosis not present

## 2022-12-21 DIAGNOSIS — Z7901 Long term (current) use of anticoagulants: Secondary | ICD-10-CM | POA: Insufficient documentation

## 2022-12-21 DIAGNOSIS — Z794 Long term (current) use of insulin: Secondary | ICD-10-CM | POA: Diagnosis not present

## 2022-12-21 DIAGNOSIS — I25118 Atherosclerotic heart disease of native coronary artery with other forms of angina pectoris: Secondary | ICD-10-CM | POA: Insufficient documentation

## 2022-12-21 DIAGNOSIS — I10 Essential (primary) hypertension: Secondary | ICD-10-CM | POA: Diagnosis not present

## 2022-12-21 DIAGNOSIS — E785 Hyperlipidemia, unspecified: Secondary | ICD-10-CM | POA: Insufficient documentation

## 2022-12-21 DIAGNOSIS — I130 Hypertensive heart and chronic kidney disease with heart failure and stage 1 through stage 4 chronic kidney disease, or unspecified chronic kidney disease: Secondary | ICD-10-CM | POA: Diagnosis not present

## 2022-12-21 DIAGNOSIS — E1165 Type 2 diabetes mellitus with hyperglycemia: Secondary | ICD-10-CM | POA: Diagnosis present

## 2022-12-21 DIAGNOSIS — Z79899 Other long term (current) drug therapy: Secondary | ICD-10-CM | POA: Insufficient documentation

## 2022-12-21 DIAGNOSIS — I480 Paroxysmal atrial fibrillation: Secondary | ICD-10-CM | POA: Diagnosis not present

## 2022-12-21 DIAGNOSIS — N281 Cyst of kidney, acquired: Secondary | ICD-10-CM | POA: Diagnosis not present

## 2022-12-21 DIAGNOSIS — I482 Chronic atrial fibrillation, unspecified: Secondary | ICD-10-CM

## 2022-12-21 DIAGNOSIS — I5032 Chronic diastolic (congestive) heart failure: Secondary | ICD-10-CM | POA: Insufficient documentation

## 2022-12-21 DIAGNOSIS — N189 Chronic kidney disease, unspecified: Secondary | ICD-10-CM | POA: Insufficient documentation

## 2022-12-21 DIAGNOSIS — R072 Precordial pain: Secondary | ICD-10-CM | POA: Diagnosis not present

## 2022-12-21 DIAGNOSIS — R109 Unspecified abdominal pain: Secondary | ICD-10-CM | POA: Diagnosis not present

## 2022-12-21 DIAGNOSIS — K8020 Calculus of gallbladder without cholecystitis without obstruction: Secondary | ICD-10-CM | POA: Diagnosis not present

## 2022-12-21 DIAGNOSIS — C182 Malignant neoplasm of ascending colon: Secondary | ICD-10-CM | POA: Diagnosis not present

## 2022-12-21 DIAGNOSIS — R079 Chest pain, unspecified: Secondary | ICD-10-CM | POA: Diagnosis present

## 2022-12-21 DIAGNOSIS — E1169 Type 2 diabetes mellitus with other specified complication: Secondary | ICD-10-CM | POA: Diagnosis present

## 2022-12-21 DIAGNOSIS — R112 Nausea with vomiting, unspecified: Secondary | ICD-10-CM

## 2022-12-21 DIAGNOSIS — I959 Hypotension, unspecified: Secondary | ICD-10-CM | POA: Diagnosis not present

## 2022-12-21 DIAGNOSIS — R001 Bradycardia, unspecified: Secondary | ICD-10-CM | POA: Diagnosis not present

## 2022-12-21 DIAGNOSIS — R0789 Other chest pain: Secondary | ICD-10-CM | POA: Diagnosis not present

## 2022-12-21 DIAGNOSIS — I4891 Unspecified atrial fibrillation: Secondary | ICD-10-CM | POA: Diagnosis not present

## 2022-12-21 DIAGNOSIS — K5750 Diverticulosis of both small and large intestine without perforation or abscess without bleeding: Secondary | ICD-10-CM | POA: Diagnosis not present

## 2022-12-21 LAB — COMPREHENSIVE METABOLIC PANEL
ALT: 22 U/L (ref 0–44)
AST: 20 U/L (ref 15–41)
Albumin: 3.8 g/dL (ref 3.5–5.0)
Alkaline Phosphatase: 80 U/L (ref 38–126)
Anion gap: 7 (ref 5–15)
BUN: 22 mg/dL (ref 8–23)
CO2: 24 mmol/L (ref 22–32)
Calcium: 8.4 mg/dL — ABNORMAL LOW (ref 8.9–10.3)
Chloride: 108 mmol/L (ref 98–111)
Creatinine, Ser: 1.1 mg/dL (ref 0.61–1.24)
GFR, Estimated: >60 mL/min (ref >60)
Glucose, Bld: 168 mg/dL — ABNORMAL HIGH (ref 70–99)
Potassium: 3.8 mmol/L (ref 3.5–5.1)
Sodium: 139 mmol/L (ref 135–145)
Total Bilirubin: 1.1 mg/dL (ref 0.3–1.2)
Total Protein: 6.6 g/dL (ref 6.5–8.1)

## 2022-12-21 LAB — CBC
HCT: 41 % (ref 39.0–52.0)
Hemoglobin: 13 g/dL (ref 13.0–17.0)
MCH: 29.4 pg (ref 26.0–34.0)
MCHC: 31.7 g/dL (ref 30.0–36.0)
MCV: 92.8 fL (ref 80.0–100.0)
Platelets: 147 10*3/uL — ABNORMAL LOW (ref 150–400)
RBC: 4.42 MIL/uL (ref 4.22–5.81)
RDW: 16.9 % — ABNORMAL HIGH (ref 11.5–15.5)
WBC: 6.2 10*3/uL (ref 4.0–10.5)
nRBC: 0 % (ref 0.0–0.2)

## 2022-12-21 LAB — LIPASE, BLOOD: Lipase: 32 U/L (ref 11–51)

## 2022-12-21 LAB — TROPONIN I (HIGH SENSITIVITY)
Troponin I (High Sensitivity): 15 ng/L (ref <18)
Troponin I (High Sensitivity): 15 ng/L (ref <18)

## 2022-12-21 LAB — GLUCOSE, CAPILLARY
Glucose-Capillary: 265 mg/dL — ABNORMAL HIGH (ref 70–99)
Glucose-Capillary: 279 mg/dL — ABNORMAL HIGH (ref 70–99)

## 2022-12-21 MED ORDER — NITROGLYCERIN 0.4 MG SL SUBL: 0.4000 mg | SUBLINGUAL_TABLET | SUBLINGUAL | Status: DC | PRN

## 2022-12-21 MED ORDER — ACETAMINOPHEN 325 MG PO TABS: 650.0000 mg | ORAL_TABLET | Freq: Four times a day (QID) | ORAL | Status: DC | PRN

## 2022-12-21 MED ORDER — IOHEXOL 350 MG/ML SOLN
100.0000 mL | Freq: Once | INTRAVENOUS | Status: AC | PRN
  Administered 2022-12-21: 100 mL via INTRAVENOUS

## 2022-12-21 MED ORDER — APIXABAN 5 MG PO TABS
5.0000 mg | ORAL_TABLET | Freq: Two times a day (BID) | ORAL | Status: DC
  Administered 2022-12-21 – 2022-12-22 (×3): 5 mg via ORAL
  Filled 2022-12-21 (×3): qty 1

## 2022-12-21 MED ORDER — CLOPIDOGREL BISULFATE 75 MG PO TABS
75.0000 mg | ORAL_TABLET | Freq: Every day | ORAL | Status: DC
  Administered 2022-12-21 – 2022-12-22 (×2): 75 mg via ORAL
  Filled 2022-12-21 (×2): qty 1

## 2022-12-21 MED ORDER — ROSUVASTATIN CALCIUM 10 MG PO TABS
40.0000 mg | ORAL_TABLET | Freq: Every day | ORAL | Status: DC
  Administered 2022-12-21: 40 mg via ORAL
  Filled 2022-12-21: qty 2
  Filled 2022-12-21: qty 4

## 2022-12-21 MED ORDER — ALUM & MAG HYDROXIDE-SIMETH 200-200-20 MG/5ML PO SUSP
15.0000 mL | Freq: Once | ORAL | Status: AC
  Administered 2022-12-21: 15 mL via ORAL
  Filled 2022-12-21: qty 30

## 2022-12-21 MED ORDER — MORPHINE SULFATE (PF) 2 MG/ML IV SOLN: 2.0000 mg | INTRAVENOUS | Status: DC | PRN

## 2022-12-21 MED ORDER — METOPROLOL TARTRATE 25 MG PO TABS
12.5000 mg | ORAL_TABLET | Freq: Two times a day (BID) | ORAL | Status: DC
  Administered 2022-12-21: 12.5 mg via ORAL
  Filled 2022-12-21: qty 1

## 2022-12-21 MED ORDER — AMLODIPINE BESYLATE 5 MG PO TABS
5.0000 mg | ORAL_TABLET | Freq: Every day | ORAL | Status: DC
  Administered 2022-12-21 – 2022-12-22 (×2): 5 mg via ORAL
  Filled 2022-12-21 (×2): qty 1

## 2022-12-21 MED ORDER — ONDANSETRON HCL 4 MG/2ML IJ SOLN: 4.0000 mg | Freq: Four times a day (QID) | INTRAMUSCULAR | Status: DC | PRN

## 2022-12-21 MED ORDER — INSULIN ASPART 100 UNIT/ML IJ SOLN: 0.0000 [IU] | Freq: Three times a day (TID) | INTRAMUSCULAR | Status: DC

## 2022-12-21 MED ORDER — ISOSORBIDE MONONITRATE ER 60 MG PO TB24
90.0000 mg | ORAL_TABLET | Freq: Every day | ORAL | Status: DC
  Administered 2022-12-21 – 2022-12-22 (×2): 90 mg via ORAL
  Filled 2022-12-21: qty 1
  Filled 2022-12-21: qty 2

## 2022-12-21 MED ORDER — ONDANSETRON HCL 4 MG/2ML IJ SOLN
4.0000 mg | Freq: Once | INTRAMUSCULAR | Status: AC
  Administered 2022-12-21: 4 mg via INTRAVENOUS
  Filled 2022-12-21: qty 2

## 2022-12-21 MED ORDER — INSULIN GLARGINE-YFGN 100 UNIT/ML ~~LOC~~ SOLN
25.0000 [IU] | Freq: Every day | SUBCUTANEOUS | Status: DC
  Administered 2022-12-21: 25 [IU] via SUBCUTANEOUS
  Filled 2022-12-21 (×2): qty 0.25

## 2022-12-21 NOTE — Assessment & Plan Note (Addendum)
Recurrent mild to moderate chest pain over the past 12-24 hours with noted prior CAD and STEMI requiring cardiac catheterization February 2024 Noted mild overlapping abd pain w/ stable CT A&P Symptoms mildly similar to prior STEMI per patient Troponin negative x 2 EKG stable Will monitor for now Continue home regimen including Plavix and statin Prn NTG Repeat 2D echo Cardiology consult Follow-up formal recommendations

## 2022-12-21 NOTE — Assessment & Plan Note (Signed)
PPI ?

## 2022-12-21 NOTE — ED Notes (Signed)
Gollan, MD, made aware of brady hr in mid 40s and low 50s. MD discontinuing metoprolol.

## 2022-12-21 NOTE — Consult Note (Signed)
Cardiology Consultation:   Patient ID: ARI BERNABEI; [id number]; Jul 25, 1933   Admit date: 12/21/2022 Date of Consult: 12/21/2022  Primary Care Provider: Judy Pimple, MD Primary Cardiologist: Jens Som Primary Electrophysiologist:  None   Patient Profile:   Ryan Garcia is a 89 y.o. male with a hx of multivessel CAD as outlined below, paroxysmal atrial fib/flutter on apixaban status post TEE guided DCCV in 09/2020, recently diagnosed adenocarcinoma of the colon status post surgical resection on 08/03/2022, iron deficiency anemia, HFpEF, right renal mass concerning for renal cell carcinoma, HTN, HLD, and OSA on CPAP who is being seen today for the evaluation of chest pain at the request of Dr. Alvester Morin.  History of Present Illness:   Mr. Formica underwent remote cardiac cath which showed multivessel CAD with medical management being advised as the patient preferred to avoid CABG. He was admitted in 06/2017 with an NSTEMI. Repeat LHC showed severe three-vessel CAD with the left sided disease being unchanged when compared to prior with a new RCA lesion status post PCI/DES. Repeat cath in 2021 showed multivessel CAD with a patent stent in the RCA. There were no targets for PCI. Medical therapy was recommended with consideration for CABG if refractory angina. Echo in 2022 showed normal LV systolic function with basal inferior hypokinesis and severe LVH. He underwent successful TEE guided DCCV in 09/2020.  He was admitted to [facility] in 07/2022 with chest discomfort associated with exertion. He was noted to be anemic with a hemoglobin of 8.7 from baseline around 13. Apixaban was held. High-sensitivity troponin was cycled, peaking at 651. Cardiology was consulted with echo showing an EF of 60 to 65%, no regional wall motion abnormalities, and a mildly elevated PASP. Elevated troponin was suspected to be supply/demand ischemia in the setting of acute anemia. CT of the chest, abdomen, and pelvis showed a 2.5 x  2.6 cm enhancing lesion in the anterior right upper kidney grossly unchanged from prior MRI compatible with solid renal neoplasm such as renal cell carcinoma. EGD was largely unrevealing. Colonoscopy showed malignant tumor in the mid descending colon. 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  He was admitted to [facility] in 08/2022 with an inferior STEMI.  He underwent emergent cardiac cath with LV gram showing preserved LV systolic function with borderline inferior hypokinesis. A large RCA was totally occluded from the mid to distal vessel with TIMI 0 flow. The left main had mild luminal irregularities. The LAD had diffuse, moderate to high-grade stenosis with TIMI-3 flow. LCx was large with diffuse moderate to high-grade stenosis with TIMI-3 flow. He underwent successful PCI/DES to the mid to distal RCA without complication with 0% residual stenosis and TIMI-3 flow.  Echo in 08/2022 showed normal LV function, mild left ventricular hypertrophy and mildly dilated ascending aorta at 39 mm.   He presented to [facility] on the morning of 6/21 after development of central chest pressure and watery diarrhea that woke him from sleep. He also had an episode of emesis after chewing up 4 baby aspirin. Pain felt similar to his prior angina. He improved with SL NTG x 2. Reports adherence to his medications. Upon arrival to [facility], he was chest pain free.  Blood pressure has ranged from the 140s to 160s mmHg systolic. High sensitivity troponin negative x 2. EKG as below. CXR without acute abnormality. CT abdomen/pelvis without acute findings with other  findings as outlined below. In the ED, he has received Mylanta and Zofran.  Currently, without symptoms of chest pain or cardiac decompensation. He continues to note intermittent watery diarrhea over the past  24 hours.   Past Medical History:  Diagnosis Date   AC (acromioclavicular) joint bone spurs    lt ankle   Allergy    allergic rhinitis   Anginal pain (HCC)    Arthritis    OA   BPH (benign prostatic hyperplasia)    microwave tx of prostate   CAD (coronary artery disease)    a. s/p cath in 2012 showing 70-75% LAD stenosis, 75% D1 stenosis, 75% OM1, 60-70% RCA stenosis, and 80% PDA --> medical therapy pursued b. similar findings by cath in 2016; 06/2017 DES to distal RCA   Chronic kidney disease    Chronic upper back pain    Diabetes mellitus    type II x8 years   GERD (gastroesophageal reflux disease)    HLD (hyperlipidemia)    10/12: TC 208, TG 213, HDL 36, LDL 129   Hx of skin cancer, basal cell    many skin cancers removed/under constant treatment.   Hypertension    NSTEMI (non-ST elevated myocardial infarction) (HCC) 06/18/2017   DES to distal RCA   Obstructive sleep apnea on CPAP    Paroxysmal atrial flutter (HCC)    Renal mass    Scoliosis     Past Surgical History:  Procedure Laterality Date   BIOPSY  08/01/2022   Procedure: BIOPSY;  Surgeon: Beverley Fiedler, MD;  Location: [facility] ENDOSCOPY;  Service: Gastroenterology;;   BREAST SURGERY  2009   breast lump removed benign   BUBBLE STUDY  10/14/2020   Procedure: BUBBLE STUDY;  Surgeon: Parke Poisson, MD;  Location: [facility] ENDOSCOPY;  Service: Cardiovascular;;   CARDIAC CATHETERIZATION N/A 03/15/2015   Procedure: Left Heart Cath and Coronary Angiography;  Surgeon: Lyn Records, MD;  Location: [facility] INVASIVE CV LAB;  Service: Cardiovascular;  Laterality: N/A;   CARDIOVERSION N/A 10/14/2020   Procedure: CARDIOVERSION;  Surgeon: Parke Poisson, MD;  Location: [facility] ENDOSCOPY;  Service: Cardiovascular;  Laterality: N/A;   CATARACT EXTRACTION W/PHACO Right 11/29/2021   Procedure: CATARACT EXTRACTION PHACO AND INTRAOCULAR LENS PLACEMENT (IOC) RIGHT DIABETIC;  Surgeon: Lockie Mola, MD;  Location: [facility] SURGERY CNTR;  Service:  Ophthalmology;  Laterality: Right;  Diabetic 18.72 01:54.3   COLONOSCOPY N/A 08/01/2022   Procedure: COLONOSCOPY;  Surgeon: Beverley Fiedler, MD;  Location: [facility] ENDOSCOPY;  Service: Gastroenterology;  Laterality: N/A;   CORONARY STENT INTERVENTION N/A 06/18/2017   Procedure: CORONARY STENT INTERVENTION;  Surgeon: Corky Crafts, MD;  Location: [facility] INVASIVE CV LAB;  Service: Cardiovascular;  Laterality: N/A;  RCA   CORONARY/GRAFT ACUTE MI REVASCULARIZATION N/A 08/17/2022   Procedure: Coronary/Graft Acute MI Revascularization;  Surgeon: Alwyn Pea, MD;  Location: [facility] INVASIVE CV LAB;  Service: Cardiovascular;  Laterality: N/A;   ESOPHAGOGASTRODUODENOSCOPY (EGD) WITH PROPOFOL N/A 08/01/2022   Procedure: ESOPHAGOGASTRODUODENOSCOPY (EGD) WITH PROPOFOL;  Surgeon: Beverley Fiedler, MD;  Location: [facility] ENDOSCOPY;  Service: Gastroenterology;  Laterality: N/A;   HEMOSTASIS CLIP PLACEMENT  08/01/2022   Procedure: HEMOSTASIS CLIP PLACEMENT;  Surgeon: Beverley Fiedler, MD;  Location: [facility] ENDOSCOPY;  Service: Gastroenterology;;   LAPAROSCOPIC LYSIS OF ADHESIONS N/A 08/03/2022   Procedure: LAPAROSCOPIC LYSIS OF ADHESIONS;  Surgeon: Griselda Miner, MD;  Location: [facility] OR;  Service: General;  Laterality: N/A;   LAPAROSCOPIC PARTIAL COLECTOMY N/A 08/03/2022   Procedure: OPEN PARTIAL  COLECTOMY;  Surgeon: Griselda Miner, MD;  Location: [facility] OR;  Service: General;  Laterality: N/A;   LEFT HEART CATH AND CORONARY ANGIOGRAPHY N/A 06/18/2017   Procedure: LEFT HEART CATH AND CORONARY ANGIOGRAPHY;  Surgeon: Corky Crafts, MD;  Location: [facility] INVASIVE CV LAB;  Service: Cardiovascular;  Laterality: N/A;   LEFT HEART CATH AND CORONARY ANGIOGRAPHY N/A 04/12/2020   Procedure: LEFT HEART CATH AND CORONARY ANGIOGRAPHY;  Surgeon: Swaziland, Peter M, MD;  Location: [facility] INVASIVE CV LAB;  Service: Cardiovascular;  Laterality: N/A;   LEFT HEART CATH AND CORONARY ANGIOGRAPHY N/A 08/17/2022   Procedure: LEFT HEART CATH AND CORONARY ANGIOGRAPHY;   Surgeon: Alwyn Pea, MD;  Location: [facility] INVASIVE CV LAB;  Service: Cardiovascular;  Laterality: N/A;   POLYPECTOMY  08/01/2022   Procedure: POLYPECTOMY;  Surgeon: Beverley Fiedler, MD;  Location: [facility] ENDOSCOPY;  Service: Gastroenterology;;   SUBMUCOSAL TATTOO INJECTION  08/01/2022   Procedure: SUBMUCOSAL TATTOO INJECTION;  Surgeon: Beverley Fiedler, MD;  Location: [facility] ENDOSCOPY;  Service: Gastroenterology;;   TEE WITHOUT CARDIOVERSION N/A 10/14/2020   Procedure: TRANSESOPHAGEAL ECHOCARDIOGRAM (TEE);  Surgeon: Parke Poisson, MD;  Location: [facility] ENDOSCOPY;  Service: Cardiovascular;  Laterality: N/A;   ULTRASOUND GUIDANCE FOR VASCULAR ACCESS  06/18/2017   Procedure: Ultrasound Guidance For Vascular Access;  Surgeon: Corky Crafts, MD;  Location: [facility] INVASIVE CV LAB;  Service: Cardiovascular;;  right radial, right femoral     Home Meds: Prior to Admission medications   Medication Sig Start Date End Date Taking? Authorizing Provider  acetaminophen (TYLENOL) 325 MG tablet Take 325-650 mg by mouth every 6 (six) hours as needed for mild pain or headache.    [provider]  Alcohol Swabs (B-D SINGLE USE SWABS REGULAR) PADS Use to check blood sugar 2 times a day 03/30/21   Tower, Audrie Gallus, MD  apixaban (ELIQUIS) 5 MG TABS tablet TAKE 1 TABLET TWICE DAILY 11/28/22   Lewayne Bunting, MD  Blood Glucose Calibration (TRUE METRIX LEVEL 1) Low SOLN Use to check control on glucose meter 03/30/21   Tower, Audrie Gallus, MD  Blood Glucose Monitoring Suppl (TRUE METRIX AIR GLUCOSE METER) w/Device KIT 1 kit by Other route 2 (two) times daily. Check blood sugar twice daily and as directed. Dx E11.65 03/30/21   Tower, Audrie Gallus, MD  cetirizine (ZYRTEC) 10 MG tablet Take 10 mg by mouth daily.    [provider]  clopidogrel (PLAVIX) 75 MG tablet Take 1 tablet (75 mg total) by mouth daily. 09/12/22   Tower, Audrie Gallus, MD  Coenzyme Q10 (CO Q 10 PO) Take 200 mg by mouth daily.    [provider]   Cyanocobalamin (VITAMIN B12) 1000 MCG TBCR Take 1,000 mcg by mouth daily. 04/12/20   Arty Baumgartner, NP  DROPLET PEN NEEDLES 31G X 6 MM MISC USE ONE TIME DAILY  AT  BEDTIME  WITH  LANTUS 01/01/22   Tower, Audrie Gallus, MD  fluticasone (FLONASE) 50 MCG/ACT nasal spray Place 1 spray into both nostrils daily as needed for allergies.     [provider]  furosemide (LASIX) 20 MG tablet Take 20 mg by mouth daily. 07/18/22   [provider]  insulin glargine (LANTUS SOLOSTAR) 100 UNIT/ML Solostar Pen Inject 50 Units into the skin at bedtime. Patient taking differently: Inject 38 Units into the skin at bedtime. 10/11/21   Tower, Audrie Gallus, MD  isosorbide mononitrate (IMDUR) 60 MG 24 hr tablet TAKE 1 TABLET EVERY DAY 12/20/22  Lewayne Bunting, MD  metFORMIN (GLUCOPHAGE) 1000 MG tablet TAKE 1 TABLET TWICE DAILY WITH MEALS 10/25/21   Tower, Audrie Gallus, MD  metoprolol tartrate (LOPRESSOR) 25 MG tablet TAKE 1/2 TABLET TWICE DAILY 12/20/22   Lewayne Bunting, MD  nitroGLYCERIN (NITROSTAT) 0.4 MG SL tablet Place 1 tablet (0.4 mg total) under the tongue every 5 (five) minutes as needed for chest pain. 05/11/19   [facility], Roe Rutherford, PA  NOVOLOG FLEXPEN 100 UNIT/ML FlexPen Inject 16 Units into the skin daily. Before dinner 09/14/21   [provider]  rosuvastatin (CRESTOR) 40 MG tablet TAKE 1 TABLET EVERY DAY 09/14/22   Lewayne Bunting, MD  TRUE METRIX BLOOD GLUCOSE TEST test strip CHECK BLOOD SUGAR TWICE DAILY 01/26/22   Tower, Audrie Gallus, MD  TRUEplus Lancets 30G MISC Check blood sugar twice daily and as directed. Dx E11.65 03/30/21   Judy Pimple, MD    Inpatient Medications: Scheduled Meds:  amLODipine  5 mg Oral Daily   apixaban  5 mg Oral BID   clopidogrel  75 mg Oral Daily   insulin glargine-yfgn  25 Units Subcutaneous QHS   isosorbide mononitrate  90 mg Oral Daily   metoprolol tartrate  12.5 mg Oral BID   rosuvastatin  40 mg Oral QHS   Continuous Infusions:  PRN  Meds: acetaminophen, morphine injection, nitroGLYCERIN, ondansetron (ZOFRAN) IV  Allergies:   Allergies  Allergen Reactions   Loratadine Other (See Comments)    Not effective   Simvastatin Other (See Comments)    Intolerant- caused joint pain    Social History:   Social History   Socioeconomic History   Marital status: Married    Spouse name: Srihith Aquilino   Number of children: 3   Years of education: Not on file   Highest education level: Not on file  Occupational History   Not on file  Tobacco Use   Smoking status: Never    Passive exposure: Never   Smokeless tobacco: Never  Vaping Use   Vaping Use: Never used  Substance and Sexual Activity   Alcohol use: No    Alcohol/week: 0.0 standard drinks of alcohol   Drug use: No   Sexual activity: Not Currently  Other Topics Concern   Not on file  Social History Narrative   Married    Physically active hard of hearing   Social Determinants of Health   Financial Resource Strain: Low Risk  (06/29/2021)   Overall Financial Resource Strain (CARDIA)    Difficulty of Paying Living Expenses: Not hard at all  Food Insecurity: No Food Insecurity (12/21/2022)   Hunger Vital Sign    Worried About Running Out of Food in the Last Year: Never true    Ran Out of Food in the Last Year: Never true  Transportation Needs: No Transportation Needs (12/21/2022)   PRAPARE - Administrator, Civil Service (Medical): No    Lack of Transportation (Non-Medical): No  Physical Activity: Inactive (06/29/2021)   Exercise Vital Sign    Days of Exercise per Week: 0 days    Minutes of Exercise per Session: 0 min  Stress: No Stress Concern Present (06/29/2021)   [employer] of Occupational Health - Occupational Stress Questionnaire    Feeling of Stress : Not at all  Social Connections: Moderately Integrated (06/29/2021)   Social Connection and Isolation Panel [NHANES]    Frequency of Communication with Friends and Family: More than  three times a week    Frequency  of Social Gatherings with Friends and Family: More than three times a week    Attends Religious Services: More than 4 times per year    Active Member of [employer] or Organizations: No    Attends Banker Meetings: Never    Marital Status: Married  Catering manager Violence: Not At Risk (12/21/2022)   Humiliation, Afraid, Rape, and Kick questionnaire    Fear of Current or Ex-Partner: No    Emotionally Abused: No    Physically Abused: No    Sexually Abused: No     Family History:   Family History  Problem Relation Age of Onset   Heart attack Brother    Colon cancer Neg Hx    Colon polyps Neg Hx    Stomach cancer Neg Hx    Esophageal cancer Neg Hx     ROS:  Review of Systems  Constitutional:  Positive for malaise/fatigue. Negative for chills, diaphoresis, fever and weight loss.  HENT:  Negative for congestion.   Eyes:  Negative for discharge and redness.  Respiratory:  Negative for cough, sputum production, shortness of breath and wheezing.   Cardiovascular:  Positive for chest pain. Negative for palpitations, orthopnea, claudication, leg swelling and PND.  Gastrointestinal:  Positive for abdominal pain and diarrhea. Negative for blood in stool, heartburn, melena, nausea and vomiting.  Musculoskeletal:  Negative for falls and myalgias.  Skin:  Negative for rash.  Neurological:  Positive for weakness. Negative for dizziness, tingling, tremors, sensory change, speech change, focal weakness and loss of consciousness.  Endo/Heme/Allergies:  Does not bruise/bleed easily.  Psychiatric/Behavioral:  Negative for substance abuse. The patient is not nervous/anxious.   All other systems reviewed and are negative.     Physical Exam/Data:   Vitals:   12/21/22 0630 12/21/22 0715 12/21/22 0800 12/21/22 0900  BP: (!) 162/73 (!) 150/71 (!) 166/79 (!) 165/75  Pulse: 69 75 75 73  Resp: (!) 22 15 19 19   Temp:      TempSrc:      SpO2: 93% 98% 97% 99%   Weight:      Height:       No intake or output data in the 24 hours ending 12/21/22 1139 Filed Weights   12/21/22 0313  Weight: 112 kg   Body mass index is 34.45 kg/m.   Physical Exam: General: Well developed, well nourished, in no acute distress. Head: Normocephalic, atraumatic, sclera non-icteric, no xanthomas, nares without discharge.  Neck: Negative for carotid bruits. JVD not elevated. Lungs: Clear bilaterally to auscultation without wheezes, rales, or rhonchi. Breathing is unlabored. Heart: Bradycardic with S1 S2. No murmurs, rubs, or gallops appreciated. Abdomen: Soft, non-tender, non-distended with normoactive bowel sounds. No hepatomegaly. No rebound/guarding. No obvious abdominal masses. Msk:  Strength and tone appear normal for age. Extremities: No clubbing or cyanosis. No edema. Distal pedal pulses are 2+ and equal bilaterally. Neuro: Alert and oriented X 3. No facial asymmetry. No focal deficit. Moves all extremities spontaneously. Psych:  Responds to questions appropriately with a normal affect.   EKG:  The EKG was personally reviewed and demonstrates: sinus bradycardia, 48 bpm, PACs, prior inferior infarct, no acute st/t changes  Telemetry:  Telemetry was personally reviewed and demonstrates: Sinus rhythm with sinus bradycardia, PACs  Weights: Filed Weights   12/21/22 0313  Weight: 112 kg    Relevant CV Studies:  Treadmill MPI 02/08/2015: Horizontal ST segment depression ST segment depression was noted during stress in the II, III, aVF, V4, V5 and V6 leads,  beginning at 4 minutes of stress, and returning to baseline after 1-5 minutes of recovery. Defect 1: There is a medium defect of mild severity present in the basal anteroseptal, basal inferior, mid anteroseptal, mid inferior and apical inferior location.   High risk study with + GXT and ischemia in LAD +/- RCA territories. Pt will follow up with Dr. Jens Som ___________  [facility] 03/15/2015: Ost Cx to Mid Cx  lesion, 80% stenosed. Ost 1st Mrg to 1st Mrg lesion, 95% stenosed. Mid Cx to Dist Cx lesion, 65% stenosed. 2nd Mrg lesion, 75% stenosed. Prox LAD lesion, 85% stenosed. Prox LAD to Mid LAD lesion, 90% stenosed. Ost 1st Diag to 1st Diag lesion, 85% stenosed. Dist LAD lesion, 65% stenosed. Prox RCA to Mid RCA lesion, 50% stenosed. RPDA lesion, 80% stenosed. 1st RPLB lesion, 70% stenosed.   Significant diffuse coronary disease essentially unchanged from prior angiogram in 2012. Recent high risk myocardial perfusion study which was done after the patient developed dyspnea. Severe diffuse obstruction in the proximal to mid LAD, severe disease in the ostial to proximal diagonal, severe proximal circumflex disease, diffusely diseased first marginal, high-grade PDA, moderate diffuse disease in the proximal to mid RCA. Normal systolic left ventricular function with normal end-diastolic pressure.     RECOMMENDATIONS:   Further recommendations pending review of data by Dr. Jens Som. ___________  [facility] 06/18/2017: Prox LAD lesion is 85% stenosed. Prox LAD to Mid LAD lesion is 75% stenosed. Ost 1st Diag to 1st Diag lesion is 85% stenosed. Dist LAD lesion is 65% stenosed. Ost Cx to Mid Cx lesion is 80% stenosed. Ost 1st Mrg to 1st Mrg lesion is 95% stenosed. Mid Cx to Dist Cx lesion is 65% stenosed. 2nd Mrg lesion is 75% stenosed. Prox RCA to Mid RCA lesion is 30% stenosed. RPDA lesion is 60% stenosed. 1st RPLB lesion is 70% stenosed. Dist RCA lesion is 99% stenosed. A drug-eluting stent was successfully placed using a STENT SYNERGY DES 3X12, postdilated to 3.3 mm. Post intervention, there is a 0% residual stenosis. LV end diastolic pressure is normal. There is no aortic valve stenosis.   Left sided disease unchanged from prior.  Culprit was RCA lesion.  D/w Dr. Jens Som, and decided to proceed with fixing the culprit lesion given the patient's preference of avoiding CABG.     SYNERGY stent  placed so DAPT could be stopped at 3 months if needed.  Otherwise, would recommend DAPT for 1 year given NSTEMI.  __________  2D echo 06/19/2017: - Left ventricle: The cavity size was normal. Wall thickness was    increased in a pattern of mild LVH. Systolic function was normal.    The estimated ejection fraction was in the range of 60% to 65%.    Wall motion was normal; there were no regional wall motion    abnormalities. Left ventricular diastolic function parameters    were normal.  - Left atrium: The atrium was mildly dilated.  - Atrial septum: No defect or patent foramen ovale was identified.  __________  2D echo 04/12/2020: 1. Left ventricular ejection fraction, by estimation, is 60 to 65%. The  left ventricle has normal function. The left ventricle demonstrates  regional wall motion abnormalities (see scoring diagram/findings for  description). Left ventricular diastolic  parameters are consistent with Grade I diastolic dysfunction (impaired  relaxation). Elevated left atrial pressure. There is mild hypokinesis of  the left ventricular, basal inferior wall.   2. Right ventricular systolic function is normal. The right ventricular  size is  normal.   3. Left atrial size was mildly dilated.   4. Right atrial size was mildly dilated.   5. The mitral valve is normal in structure. No evidence of mitral valve  regurgitation. No evidence of mitral stenosis.   6. The aortic valve is normal in structure. Aortic valve regurgitation is  not visualized. Mild aortic valve sclerosis is present, with no evidence  of aortic valve stenosis.   7. The inferior vena cava is normal in size with greater than 50%  respiratory variability, suggesting right atrial pressure of 3 mmHg.  ____________  [facility] 04/12/2020: Prox LAD lesion is 85% stenosed. Ost 1st Diag to 1st Diag lesion is 85% stenosed. Dist LAD lesion is 65% stenosed. Prox LAD to Mid LAD lesion is 75% stenosed. Ost Cx to Mid Cx lesion  is 80% stenosed. Ost 1st Mrg to 1st Mrg lesion is 95% stenosed. Mid Cx to Dist Cx lesion is 65% stenosed. 2nd Mrg lesion is 75% stenosed. RPDA lesion is 60% stenosed. 1st RPL lesion is 70% stenosed. Prox RCA to Mid RCA lesion is 30% stenosed. Previously placed Dist RCA drug eluting stent is widely patent. Balloon angioplasty was performed. LV end diastolic pressure is normal.   1. Severe 2 vessel obstructive CAD 2. Patent stent in the RCA 3. Normal LVEDP   Plan: the severe disease in the left coronary distribution is unchanged from prior. No suitable targets for PCI. Recommend continued medical therapy. If refractory angina would need to consider CABG. __________  2D echo 10/12/2020: 1. Left ventricular ejection fraction, by estimation, is 55 to 60%. The  left ventricle has normal function. The left ventricle demonstrates  regional wall motion abnormalities. Mild basal inferior hypokinesis based  on limited views consistent with prior  TTE. There is severe concentric left ventricular hypertrophy. LV diastolic  function is indeterminant due to atrial fibrilltion.   2. Right ventricular systolic function is normal. The right ventricular  size is normal. Tricuspid regurgitation signal is inadequate for assessing  PA pressure.   3. The mitral valve is normal in structure. Trivial mitral valve  regurgitation. No evidence of mitral stenosis.   4. The aortic valve is tricuspid. Aortic valve regurgitation is not  visualized. No aortic stenosis is present.   5. The inferior vena cava is normal in size with greater than 50%  respiratory variability, suggesting right atrial pressure of 3 mmHg.   Comparison(s): Compared to prior TTE on 04/12/2020, the patient is now in  Afib with RVR. Otherwise, no significant change.  ___________  TEE 10/14/2020: 1. Left ventricular ejection fraction, by estimation, is 50 to 55%. The  left ventricle has low normal function.   2. Right ventricular  systolic function is mildly reduced. The right  ventricular size is normal.   3. No left atrial/left atrial appendage thrombus was detected. The LAA  emptying velocity was 68 cm/s.   4. Right atrial size was mildly dilated.   5. A small pericardial effusion is present.   6. The mitral valve is degenerative. Mild mitral valve regurgitation.   7. Tricuspid valve regurgitation is mild to moderate.   8. The aortic valve is normal in structure. Aortic valve regurgitation is  not visualized. No aortic stenosis is present.   9. There is mild (Grade II) atheroma plaque involving the transverse and  descending aorta.  10. One late bubble from agitated saline exam seen, suggestive of tiny  intrapulmonary shunt.  ___________  2D echo 07/31/2022: 1. Left ventricular ejection fraction,  by estimation, is 60 to 65%. The  left ventricle has normal function. The left ventricle has no regional  wall motion abnormalities. There is mild concentric left ventricular  hypertrophy. Left ventricular diastolic  function could not be evaluated.   2. Right ventricular systolic function is normal. The right ventricular  size is normal. There is mildly elevated pulmonary artery systolic  pressure. The estimated right ventricular systolic pressure is 36.6 mmHg.   3. The mitral valve is grossly normal. No evidence of mitral valve  regurgitation. No evidence of mitral stenosis.   4. The aortic valve is tricuspid. There is mild calcification of the  aortic valve. There is mild thickening of the aortic valve. Aortic valve  regurgitation is not visualized. Aortic valve sclerosis/calcification is  present, without any evidence of  aortic stenosis.   5. The inferior vena cava is normal in size with greater than 50%  respiratory variability, suggesting right atrial pressure of 3 mmHg.  ___________  [facility] 08/17/2022: Left coronary system large left main mild irregularities LAD diffuse moderate to high-grade stenosis  TIMI-3 flow Circumflex large diffuse moderate to high-grade stenosis TIMI-3 flow RCA large totally occluded mid to distal TIMI 0 flow   Intervention Successful PCI and stent mid to distal RCA 1 drug-eluting stent 3 oh x 34 mm frontier Onyx deployed at 14 atm Postdilated with a 3.0x 20 Dragoon Euphora up to 25 atm   Lesion reduced from 100 down to 0% TIMI-3 flow restored from TIMI 0 Recommendations aspirin Brilinta for 12 months   Transferred to ICU Anticipate discharge within  48 hours if patient remains stable   Hospitalist consulted for medical management Patient will be transferred to cardiology management in the morning to [facility] __________  2D echo 08/18/2022: 1. Left ventricular ejection fraction, by estimation, is 60 to 65%. The  left ventricle has normal function. The left ventricle has no regional  wall motion abnormalities. There is mild left ventricular hypertrophy.  Left ventricular diastolic parameters  were normal.   2. Right ventricular systolic function is normal. The right ventricular  size is normal. There is normal pulmonary artery systolic pressure.   3. The mitral valve is normal in structure. Trivial mitral valve  regurgitation.   4. The aortic valve is tricuspid. Aortic valve regurgitation is not  visualized.   5. Aortic dilatation noted. There is mild dilatation of the ascending  aorta, measuring 39 mm.   6. The inferior vena cava is normal in size with greater than 50%  respiratory variability, suggesting right atrial pressure of 3 mmHg.   Laboratory Data:  Chemistry Recent Labs  Lab 12/21/22 0315  NA 139  K 3.8  CL 108  CO2 24  GLUCOSE 168*  BUN 22  CREATININE 1.10  CALCIUM 8.4*  GFRNONAA >60  ANIONGAP 7    Recent Labs  Lab 12/21/22 0315  PROT 6.6  ALBUMIN 3.8  AST 20  ALT 22  ALKPHOS 80  BILITOT 1.1   Hematology Recent Labs  Lab 12/21/22 0315  WBC 6.2  RBC 4.42  HGB 13.0  HCT 41.0  MCV 92.8  MCH 29.4  MCHC 31.7  RDW 16.9*   PLT 147*   Cardiac EnzymesNo results for input(s): "TROPONINI" in the last 168 hours. No results for input(s): "TROPIPOC" in the last 168 hours.  BNPNo results for input(s): "BNP", "PROBNP" in the last 168 hours.  DDimer No results for input(s): "DDIMER" in the last 168 hours.  Radiology/Studies:  CT ABDOMEN PELVIS W  CONTRAST  Result Date: 12/21/2022 IMPRESSION: 1. No acute findings in the abdomen or pelvis. Specifically, no findings to explain the patient's history of abdominal pain. 2. 2.6 x 2.3 cm heterogeneously enhancing lesion in the upper pole right kidney is compatible with neoplasm and has been characterized on multiple prior imaging studies. 3. 17 mm hypervascular lesion in the inferior tip of the right liver with apparent early opacification of the right hepatic vein is compatible with vascular malformation. 4. Cholelithiasis. 5.  Aortic Atherosclerosis (ICD10-I70.0). Electronically Signed   By: Kennith Center M.D.   On: 12/21/2022 06:25   DG Chest Port 1 View  Result Date: 12/21/2022 IMPRESSION: No acute abnormality noted. Electronically Signed   By: Alcide Clever M.D.   On: 12/21/2022 03:41    Assessment and Plan:   1. CAD involving the native coronary arteries with stable angina: -Currently, without angina or symptoms of cardiac decompensation -Ruled out -No evidence of ACS -No indication for heparin gtt at this time -Continues to decline CABG -Given he is without further angina, and in the setting of negative high sensitivity troponin with continued declination of CABG, no plans for inpatient ischemic testing at this time with known multivessel CAD -Escalate antianginal therapy with titration of Imdur to 90 mg and addition of amlodipine 5 mg daily -Remains on low dose metoprolol 12.5 mg bid, bradycardia precludes titration  -Continue Plavix and Crestor -Follow up as outpatient   2. PAF: -Maintaining sinus rhythm with PACs -Metoprolol as above -CHADS2VASc 5 (HTN, age x  2, DM, vascular disease) -PTA Eliquis 5 mg bid, he does not meet reduced dosing criteria   3. HTN: -Blood pressure has ranged from the 140s to 160s mmHg systolic -Medical therapy as above  4. HLD: -LDL 43 in 08/2022 -Crestor 40 mg  5. DM2: -A1c 7.9 -Per IM  6. OSA: -CPAP  7. Recently diagnosed adenocarcinoma of the colon status post resection in 08/2022 with right renal mass concerning for RCC: -Per Oncology   8. Abdominal pain with watery diarrhea: -Multiple watery stools over the past 24 hours -CT abdomen/pelvis without acute findings with incidental notations as above -Management per IM    For questions or updates, please contact [facility] Please consult [email or web address] for contact info under Cardiology/STEMI.   Signed, Eula Listen, PA-C [facility] Pager: [phone] 12/21/2022, 11:39 AM

## 2022-12-21 NOTE — ED Notes (Signed)
Patient transported to CT 

## 2022-12-21 NOTE — ED Notes (Signed)
Pt had c/o cp coming back from a 0-2 on 0-10 scale. MD Modesto Charon notified and new orders placed

## 2022-12-21 NOTE — Assessment & Plan Note (Signed)
Continue Crestor 

## 2022-12-21 NOTE — ED Notes (Signed)
Back from CT

## 2022-12-21 NOTE — ED Triage Notes (Signed)
Per ems pt has hx of 2MI's and c/o central chest pain. Pt took 2 nitroglycerin and became nauseas and attempted to take ASA but may have vomited it up. MD Modesto Charon at bedside. Pt is aaox4 at this time.

## 2022-12-21 NOTE — Assessment & Plan Note (Signed)
SSI

## 2022-12-21 NOTE — ED Provider Notes (Signed)
[facility] Provider Note    Event Date/Time   First MD Initiated Contact with Patient 12/21/22 [phone]     (approximate)   History   Chest Pain   HPI  Ryan Garcia is a 89 y.o. male   Past medical history of CAD, prior MI, paroxysmal atrial flutter on apixaban, adenocarcinoma of the colon status postsurgical resection, iron deficient anemia, heart failure with preserved ejection fraction, hypertension, hyperlipidemia, OSA on CPAP, recent hospitalization for STEMI status post successful PCI and stenting of the RCA in February 2024 who presents to the emergency department with sensation of indigestion and substernal chest discomfort awaking him from sleep this morning.  He took 2 nitroglycerin and aspirin and approximately 30 minutes later the discomfort subsided.  He states that he is otherwise been in his regular state of health been taking all medications as prescribed, no recent illnesses, no chest discomfort in the last several days.  Currently he feels well.  During the episode before, he felt a sensation of "indigestion" and vomited.  He denies any associated diaphoresis, shortness of breath.  Independent Historian contributed to assessment above: EMS provides history corroborating history above, states no STEMI on EKG, no further meds administered by EMS en route  External Medical Documents Reviewed: Discharge summary from February 2024 for STEMI with PCI stent of RCA      Physical Exam   Triage Vital Signs: ED Triage Vitals  Enc Vitals Group     BP --      Pulse Rate 12/21/22 0312 60     Resp 12/21/22 0312 20     Temp 12/21/22 0312 98.2 F (36.8 C)     Temp Source 12/21/22 0312 Oral     SpO2 12/21/22 0312 100 %     Weight --      Height --      Head Circumference --      Peak Flow --      Pain Score 12/21/22 0310 2     Pain Loc --      Pain Edu? --      Excl. in GC? --     Most recent vital signs: Vitals:   12/21/22 0500  12/21/22 0530  BP: (!) 153/72 (!) 148/60  Pulse: (!) 59 (!) 59  Resp: 18 17  Temp:    SpO2: 100% 95%    General: Awake, no distress.  CV:  Good peripheral perfusion.  Resp:  Normal effort.  Abd:  No distention.  Other:  Awake alert comfortable appearing euvolemic, soft nontender abdomen, clear lungs.  Radial pulses intact equal bilaterally.  No respiratory distress speaking full sentences   ED Results / Procedures / Treatments   Labs (all labs ordered are listed, but only abnormal results are displayed) Labs Reviewed  CBC - Abnormal; Notable for the following components:      Result Value   RDW 16.9 (*)    Platelets 147 (*)    All other components within normal limits  COMPREHENSIVE METABOLIC PANEL - Abnormal; Notable for the following components:   Glucose, Bld 168 (*)    Calcium 8.4 (*)    All other components within normal limits  LIPASE, BLOOD  TROPONIN I (HIGH SENSITIVITY)  TROPONIN I (HIGH SENSITIVITY)     I ordered and reviewed the above labs they are notable for troponin 15, 15  EKG  ED ECG REPORT I, Pilar Jarvis, the attending physician, personally viewed and interpreted this ECG.  Date: 12/21/2022  EKG Time: 0312  Rate: 48  Rhythm: AF bradycardic  Axis: nl  Intervals:none  ST&T Change: no stemi    RADIOLOGY I independently reviewed and interpreted chest x-ray and I see no obvious focal consolidation or pneumothorax   PROCEDURES:  Critical Care performed: No  Procedures   MEDICATIONS ORDERED IN ED: Medications  ondansetron (ZOFRAN) injection 4 mg (4 mg Intravenous Given 12/21/22 0501)  alum & mag hydroxide-simeth (MAALOX/MYLANTA) 200-200-20 MG/5ML suspension 15 mL (15 mLs Oral Given 12/21/22 0501)    External physician / consultants:  I spoke with hospitalist for admission and regarding care plan for this patient.   IMPRESSION / MDM / ASSESSMENT AND PLAN / ED COURSE  I reviewed the triage vital signs and the nursing notes.                                 Patient's presentation is most consistent with acute presentation with potential threat to life or bodily function.  Differential diagnosis includes, but is not limited to, ACS, PE, dissection, dyspepsia, pancreatitis, biliary colic  The patient is on the cardiac monitor to evaluate for evidence of arrhythmia and/or significant heart rate changes.  MDM: High risk chest pain, atypical, in this patient with recent MI.  No current chest pain no STEMI on EKG.  Will follow-up with serial troponins.  Sensation of indigestion and vomiting associated with his substernal chest pain --- he has soft nontender abdomen doubt surgical abdominal pathologies at this time.  Check basic labs, LFT, lipase.   --  Discomfort had subsided upon my initial evaluation but then recurred during his emergency department stay.  Repeat EKG shows no ischemic changes.  Troponin has been flat at 15, 15.  New discomfort in the substernal area radiating to the right upper quadrant of his abdomen.  Abdomen remains soft and nontender no rigidity or guarding.  He has a vague abdominal discomfort associated now, will obtain a CT scan of the abdomen pelvis given his history of adenocarcinoma of the colon status post resection in February, as well as a right kidney mass suspicious for cancer at that time, rule out surgical abdominal pathologies.  Given his recurring vague substernal chest pain and history of CAD with stenting earlier this year, plan for admission for high risk chest pain.         FINAL CLINICAL IMPRESSION(S) / ED DIAGNOSES   Final diagnoses:  Nonspecific chest pain  Nausea and vomiting, unspecified vomiting type     Rx / DC Orders   ED Discharge Orders     None        Note:  This document was prepared using Dragon voice recognition software and may include unintentional dictation errors.    Pilar Jarvis, MD 12/21/22 [phone]

## 2022-12-21 NOTE — H&P (Signed)
History and Physical    Patient: Ryan Garcia:[id number] DOB: Mar 10, 1934 DOA: 12/21/2022 DOS: the patient was seen and examined on 12/21/2022 PCP: Judy Pimple, MD  Patient coming from: Home  Chief Complaint:  Chief Complaint  Patient presents with   Chest Pain   HPI: Ryan Garcia is a 89 y.o. male with medical history significant of multivessel coronary artery disease, STEMI status post stent placement February 2024, type 2 diabetes, GERD, atrial fibrillation, hyperlipidemia presenting with chest pain.  Patient reports recurring central chest pain and.  [name] since earlier today.  No fevers or chills.  Mild shortness of breath is fairly chronic.  Has had some mild lower extremity swelling.  Noted admission February 2024 for STEMI requiring stent placement.  Has been compliant with home regimen including Eliquis, Plavix and statin.  No reported alcohol or tobacco use.  No hemiparesis or confusion.  Chest pain mild to moderate though it did wake him up from sleep.  Somewhat similar to prior episode of chest pain associated with STEMI. To the ER afebrile, hemodynamically stable.  Labs grossly within normal notes.  Troponin negative x 2.  EKG normal sinus rhythm. Review of Systems: As mentioned in the history of present illness. All other systems reviewed and are negative. Past Medical History:  Diagnosis Date   AC (acromioclavicular) joint bone spurs    lt ankle   Allergy    allergic rhinitis   Anginal pain (HCC)    Arthritis    OA   BPH (benign prostatic hyperplasia)    microwave tx of prostate   CAD (coronary artery disease)    a. s/p cath in 2012 showing 70-75% LAD stenosis, 75% D1 stenosis, 75% OM1, 60-70% RCA stenosis, and 80% PDA --> medical therapy pursued b. similar findings by cath in 2016; 06/2017 DES to distal RCA   Chronic kidney disease    Chronic upper back pain    Diabetes mellitus    type II x8 years   GERD (gastroesophageal reflux disease)    HLD  (hyperlipidemia)    10/12: TC 208, TG 213, HDL 36, LDL 129   Hx of skin cancer, basal cell    many skin cancers removed/under constant treatment.   Hypertension    NSTEMI (non-ST elevated myocardial infarction) (HCC) 06/18/2017   DES to distal RCA   Obstructive sleep apnea on CPAP    Paroxysmal atrial flutter (HCC)    Renal mass    Scoliosis    Past Surgical History:  Procedure Laterality Date   BIOPSY  08/01/2022   Procedure: BIOPSY;  Surgeon: Beverley Fiedler, MD;  Location: [facility] ENDOSCOPY;  Service: Gastroenterology;;   BREAST SURGERY  2009   breast lump removed benign   BUBBLE STUDY  10/14/2020   Procedure: BUBBLE STUDY;  Surgeon: Parke Poisson, MD;  Location: [facility] ENDOSCOPY;  Service: Cardiovascular;;   CARDIAC CATHETERIZATION N/A 03/15/2015   Procedure: Left Heart Cath and Coronary Angiography;  Surgeon: Lyn Records, MD;  Location: [facility] INVASIVE CV LAB;  Service: Cardiovascular;  Laterality: N/A;   CARDIOVERSION N/A 10/14/2020   Procedure: CARDIOVERSION;  Surgeon: Parke Poisson, MD;  Location: [facility] ENDOSCOPY;  Service: Cardiovascular;  Laterality: N/A;   CATARACT EXTRACTION W/PHACO Right 11/29/2021   Procedure: CATARACT EXTRACTION PHACO AND INTRAOCULAR LENS PLACEMENT (IOC) RIGHT DIABETIC;  Surgeon: Lockie Mola, MD;  Location: [facility] SURGERY CNTR;  Service: Ophthalmology;  Laterality: Right;  Diabetic 18.72 01:54.3   COLONOSCOPY N/A 08/01/2022   Procedure: COLONOSCOPY;  Surgeon: Beverley Fiedler, MD;  Location: [facility] ENDOSCOPY;  Service: Gastroenterology;  Laterality: N/A;   CORONARY STENT INTERVENTION N/A 06/18/2017   Procedure: CORONARY STENT INTERVENTION;  Surgeon: Corky Crafts, MD;  Location: [facility] INVASIVE CV LAB;  Service: Cardiovascular;  Laterality: N/A;  RCA   CORONARY/GRAFT ACUTE MI REVASCULARIZATION N/A 08/17/2022   Procedure: Coronary/Graft Acute MI Revascularization;  Surgeon: Alwyn Pea, MD;  Location: [facility] INVASIVE CV LAB;  Service: Cardiovascular;   Laterality: N/A;   ESOPHAGOGASTRODUODENOSCOPY (EGD) WITH PROPOFOL N/A 08/01/2022   Procedure: ESOPHAGOGASTRODUODENOSCOPY (EGD) WITH PROPOFOL;  Surgeon: Beverley Fiedler, MD;  Location: [facility] ENDOSCOPY;  Service: Gastroenterology;  Laterality: N/A;   HEMOSTASIS CLIP PLACEMENT  08/01/2022   Procedure: HEMOSTASIS CLIP PLACEMENT;  Surgeon: Beverley Fiedler, MD;  Location: [facility] ENDOSCOPY;  Service: Gastroenterology;;   LAPAROSCOPIC LYSIS OF ADHESIONS N/A 08/03/2022   Procedure: LAPAROSCOPIC LYSIS OF ADHESIONS;  Surgeon: Griselda Miner, MD;  Location: [facility] OR;  Service: General;  Laterality: N/A;   LAPAROSCOPIC PARTIAL COLECTOMY N/A 08/03/2022   Procedure: OPEN PARTIAL COLECTOMY;  Surgeon: Griselda Miner, MD;  Location: [facility] OR;  Service: General;  Laterality: N/A;   LEFT HEART CATH AND CORONARY ANGIOGRAPHY N/A 06/18/2017   Procedure: LEFT HEART CATH AND CORONARY ANGIOGRAPHY;  Surgeon: Corky Crafts, MD;  Location: [facility] INVASIVE CV LAB;  Service: Cardiovascular;  Laterality: N/A;   LEFT HEART CATH AND CORONARY ANGIOGRAPHY N/A 04/12/2020   Procedure: LEFT HEART CATH AND CORONARY ANGIOGRAPHY;  Surgeon: Swazil[place], Peter M, MD;  Location: [facility] INVASIVE CV LAB;  Service: Cardiovascular;  Laterality: N/A;   LEFT HEART CATH AND CORONARY ANGIOGRAPHY N/A 08/17/2022   Procedure: LEFT HEART CATH AND CORONARY ANGIOGRAPHY;  Surgeon: Alwyn Pea, MD;  Location: [facility] INVASIVE CV LAB;  Service: Cardiovascular;  Laterality: N/A;   POLYPECTOMY  08/01/2022   Procedure: POLYPECTOMY;  Surgeon: Beverley Fiedler, MD;  Location: [facility] ENDOSCOPY;  Service: Gastroenterology;;   SUBMUCOSAL TATTOO INJECTION  08/01/2022   Procedure: SUBMUCOSAL TATTOO INJECTION;  Surgeon: Beverley Fiedler, MD;  Location: [facility] ENDOSCOPY;  Service: Gastroenterology;;   TEE WITHOUT CARDIOVERSION N/A 10/14/2020   Procedure: TRANSESOPHAGEAL ECHOCARDIOGRAM (TEE);  Surgeon: Parke Poisson, MD;  Location: [facility] ENDOSCOPY;  Service: Cardiovascular;  Laterality: N/A;   ULTRASOUND  GUIDANCE FOR VASCULAR ACCESS  06/18/2017   Procedure: Ultrasound Guidance For Vascular Access;  Surgeon: Corky Crafts, MD;  Location: [facility] INVASIVE CV LAB;  Service: Cardiovascular;;  right radial, right femoral   Social History:  reports that he has never smoked. He has never been exposed to tobacco smoke. He has never used smokeless tobacco. He reports that he does not drink alcohol and does not use drugs.  Allergies  Allergen Reactions   Loratadine Other (See Comments)    Not effective   Simvastatin Other (See Comments)    Intolerant- caused joint pain    Family History  Problem Relation Age of Onset   Heart attack Brother    Colon cancer Neg Hx    Colon polyps Neg Hx    Stomach cancer Neg Hx    Esophageal cancer Neg Hx     Prior to Admission medications   Medication Sig Start Date End Date Taking? Authorizing Provider  acetaminophen (TYLENOL) 325 MG tablet Take 325-650 mg by mouth every 6 (six) hours as needed for mild pain or headache.    [provider]  Alcohol Swabs (B-D SINGLE USE SWABS REGULAR) PADS Use to check blood sugar 2 times a  day 03/30/21   Tower, Audrie Gallus, MD  apixaban (ELIQUIS) 5 MG TABS tablet TAKE 1 TABLET TWICE DAILY 11/28/22   Lewayne Bunting, MD  Blood Glucose Calibration (TRUE METRIX LEVEL 1) Low SOLN Use to check control on glucose meter 03/30/21   Tower, Audrie Gallus, MD  Blood Glucose Monitoring Suppl (TRUE METRIX AIR GLUCOSE METER) w/Device KIT 1 kit by Other route 2 (two) times daily. Check blood sugar twice daily and as directed. Dx E11.65 03/30/21   Tower, Audrie Gallus, MD  cetirizine (ZYRTEC) 10 MG tablet Take 10 mg by mouth daily.    [provider]  clopidogrel (PLAVIX) 75 MG tablet Take 1 tablet (75 mg total) by mouth daily. 09/12/22   Tower, Audrie Gallus, MD  Coenzyme Q10 (CO Q 10 PO) Take 200 mg by mouth daily.    [provider]  Cyanocobalamin (VITAMIN B12) 1000 MCG TBCR Take 1,000 mcg by mouth daily. 04/12/20   Arty Baumgartner, NP  DROPLET PEN NEEDLES 31G X 6 MM MISC USE ONE TIME DAILY  AT  BEDTIME  WITH  LANTUS 01/01/22   Tower, Audrie Gallus, MD  fluticasone (FLONASE) 50 MCG/ACT nasal spray Place 1 spray into both nostrils daily as needed for allergies.     [provider]  furosemide (LASIX) 20 MG tablet Take 20 mg by mouth daily. 07/18/22   [provider]  insulin glargine (LANTUS SOLOSTAR) 100 UNIT/ML Solostar Pen Inject 50 Units into the skin at bedtime. Patient taking differently: Inject 38 Units into the skin at bedtime. 10/11/21   Tower, Audrie Gallus, MD  isosorbide mononitrate (IMDUR) 60 MG 24 hr tablet TAKE 1 TABLET EVERY DAY 12/20/22   Lewayne Bunting, MD  metFORMIN (GLUCOPHAGE) 1000 MG tablet TAKE 1 TABLET TWICE DAILY WITH MEALS 10/25/21   Tower, Audrie Gallus, MD  metoprolol tartrate (LOPRESSOR) 25 MG tablet TAKE 1/2 TABLET TWICE DAILY 12/20/22   Lewayne Bunting, MD  nitroGLYCERIN (NITROSTAT) 0.4 MG SL tablet Place 1 tablet (0.4 mg total) under the tongue every 5 (five) minutes as needed for chest pain. 05/11/19   [facility], Roe Rutherford, PA  NOVOLOG FLEXPEN 100 UNIT/ML FlexPen Inject 16 Units into the skin daily. Before dinner 09/14/21   [provider]  rosuvastatin (CRESTOR) 40 MG tablet TAKE 1 TABLET EVERY DAY 09/14/22   Lewayne Bunting, MD  TRUE METRIX BLOOD GLUCOSE TEST test strip CHECK BLOOD SUGAR TWICE DAILY 01/26/22   Tower, Audrie Gallus, MD  TRUEplus Lancets 30G MISC Check blood sugar twice daily and as directed. Dx E11.65 03/30/21   Judy Pimple, MD    Physical Exam: Vitals:   12/21/22 0500 12/21/22 0530 12/21/22 0630 12/21/22 0715  BP: (!) 153/72 (!) 148/60 (!) 162/73 (!) 150/71  Pulse: (!) 59 (!) 59 69 75  Resp: 18 17 (!) 22 15  Temp:      TempSrc:      SpO2: 100% 95% 93% 98%  Weight:      Height:       Physical Exam Constitutional:      Appearance: He is obese.  HENT:     Head: Normocephalic and atraumatic.     Nose: Nose normal.     Mouth/Throat:     Mouth: Mucous membranes  are moist.  Eyes:     Pupils: Pupils are equal, round, and reactive to light.  Cardiovascular:     Rate and Rhythm: Normal rate and regular rhythm.  Pulmonary:  Effort: Pulmonary effort is normal.  Abdominal:     General: Bowel sounds are normal.  Musculoskeletal:        General: Normal range of motion.     Cervical back: Normal range of motion.     Right lower leg: Edema present.     Left lower leg: Edema present.  Skin:    General: Skin is warm.  Neurological:     General: No focal deficit present.  Psychiatric:        Mood and Affect: Mood normal.     Data Reviewed:  There are no new results to review at this time.  Assessment and Plan: Chest pain Recurrent mild to moderate chest pain over the past 12-24 hours with noted prior CAD and STEMI requiring cardiac catheterization February 2024 Noted mild overlapping abd pain w/ stable CT A&P Symptoms mildly similar to prior STEMI per patient Troponin negative x 2 EKG stable Will monitor for now Continue home regimen including Plavix and statin Prn NTG Repeat 2D echo Cardiology consult Follow-up formal recommendations  Coronary artery disease involving native coronary artery of native heart without angina pectoris Baseline coronary multivessel disease with NSTEMI February 2024 requiring stent placement Noted prior discussions about possible CABG to which patient has declined in the past Positive chest pain, though with normal troponin x 2 and stable EKG Will continue home regimen Cardiology consulted Follow-up formal recommendations Continue home Plavix and statin in the interim  Paroxysmal atrial flutter (HCC) Rate controlled at present Continue home metoprolol and Eliquis  Uncontrolled type 2 diabetes mellitus with hyperglycemia (HCC) SSI  GERD (gastroesophageal reflux disease) PPI  Hyperlipidemia associated with type 2 diabetes mellitus (HCC) Continue Crestor      Advance Care Planning:   Code  Status: Full Code   Consults: Cardiology- Benita Gutter   Family Communication: Wife at the bedside   Severity of Illness: The appropriate patient status for this patient is OBSERVATION. Observation status is judged to be reasonable and necessary in order to provide the required intensity of service to ensure the patient's safety. The patient's presenting symptoms, physical exam findings, and initial radiographic and laboratory data in the context of their medical condition is felt to place them at decreased risk for further clinical deterioration. Furthermore, it is anticipated that the patient will be medically stable for discharge from the hospital within 2 midnights of admission.   Greater than 50% was spent in counseling and coordination of care with patient Total encounter time 80 minutes or more  Author: Floydene Flock, MD 12/21/2022 8:59 AM  For on call review [email or web address].

## 2022-12-21 NOTE — Assessment & Plan Note (Signed)
Rate controlled at present Continue home metoprolol and Eliquis

## 2022-12-21 NOTE — Assessment & Plan Note (Addendum)
Baseline coronary multivessel disease with NSTEMI February 2024 requiring stent placement Noted prior discussions about possible CABG to which patient has declined in the past Positive chest pain, though with normal troponin x 2 and stable EKG Will continue home regimen Cardiology consulted Follow-up formal recommendations Continue home Plavix and statin in the interim

## 2022-12-22 DIAGNOSIS — E1165 Type 2 diabetes mellitus with hyperglycemia: Secondary | ICD-10-CM | POA: Diagnosis not present

## 2022-12-22 DIAGNOSIS — R079 Chest pain, unspecified: Secondary | ICD-10-CM

## 2022-12-22 DIAGNOSIS — I4892 Unspecified atrial flutter: Secondary | ICD-10-CM

## 2022-12-22 DIAGNOSIS — I2510 Atherosclerotic heart disease of native coronary artery without angina pectoris: Secondary | ICD-10-CM

## 2022-12-22 LAB — GLUCOSE, CAPILLARY: Glucose-Capillary: 225 mg/dL — ABNORMAL HIGH (ref 70–99)

## 2022-12-22 MED ORDER — ISOSORBIDE MONONITRATE ER 30 MG PO TB24: 90.0000 mg | ORAL_TABLET | Freq: Every day | ORAL | 0 refills | Status: DC

## 2022-12-22 NOTE — Discharge Summary (Addendum)
Physician Discharge Summary   Patient: Ryan Garcia MRN: [id number] DOB: 05-24-1934  Admit date:     12/21/2022  Discharge date: 12/22/22  Discharge Physician: Enedina Finner   PCP: Judy Pimple, MD   Recommendations at discharge:    Return to ER if sign symptoms return  Discharge Diagnoses: Active Problems:   Nonspecific chest pain   Coronary artery disease involving native coronary artery of native heart without angina pectoris   Hyperlipidemia associated with type 2 diabetes mellitus (HCC)   GERD (gastroesophageal reflux disease)   Uncontrolled type 2 diabetes mellitus with hyperglycemia (HCC)   Paroxysmal atrial flutter (HCC)   Nausea and vomiting   Diarrhea Ryan Garcia is a 89 y.o. male with medical history significant of multivessel coronary artery disease, STEMI status post stent placement February 2024, type 2 diabetes, GERD, atrial fibrillation, hyperlipidemia presenting with chest pain.  Patient reports recurring central chest pain and.  Ryan Garcia pain since earlier today   Chest pain appears atypical Recurrent mild to moderate chest pain over the past 12-24 hours with noted prior CAD and STEMI requiring cardiac catheterization February 2024 Noted mild overlapping abd pain w/ stable CT A&P Symptoms mildly similar to prior STEMI per patient Troponin negative x 2 EKG stable Continue home regimen including Plavix and statin Prn NTG Cardiology consult with Dr Georgiann Hahn further w/u recommended. Ok to d/c Pt to f/u Dr Jens Som as out pt Increased Imdur to 90 mg every day Pt CP free   Coronary artery disease involving native coronary artery of native heart without angina pectoris Baseline coronary multivessel disease with NSTEMI February 2024 requiring stent placement Noted prior discussions about possible CABG to which patient has declined in the past normal troponin x 2 and stable EKG--no cp today Will continue home regimen Continue home Plavix and statin   Paroxysmal  atrial flutter (HCC) Rate controlled at present Continue home metoprolol and Eliquis   Uncontrolled type 2 diabetes mellitus with hyperglycemia (HCC) SSI   GERD (gastroesophageal reflux disease) PPI   Hyperlipidemia associated with type 2 diabetes mellitus (HCC) Continue Crestor         Advance Care Planning:   Code Status: Full Code   Consults: [facility]  Family Communication: Wife at the bedside       Diet recommendation:  Discharge Diet Orders (From admission, onward)     Start     Ordered   12/22/22 0000  Diet - low sodium heart healthy        12/22/22 1009   12/22/22 0000  Diet Carb Modified        12/22/22 1009            DISCHARGE MEDICATION: Allergies as of 12/22/2022       Reactions   Loratadine Other (See Comments)   Not effective   Simvastatin Other (See Comments)   Intolerant- caused joint pain        Medication List     STOP taking these medications    NovoLOG FlexPen 100 UNIT/ML FlexPen Generic drug: insulin aspart       TAKE these medications    acetaminophen 325 MG tablet Commonly known as: TYLENOL Take 325-650 mg by mouth every 6 (six) hours as needed for mild pain or headache.   B-D SINGLE USE SWABS REGULAR Pads Use to check blood sugar 2 times a day   cetirizine 10 MG tablet Commonly known as: ZYRTEC Take 10 mg by mouth daily.   clopidogrel 75 MG tablet  Commonly known as: PLAVIX Take 1 tablet (75 mg total) by mouth daily.   CO Q 10 PO Take 200 mg by mouth daily.   Droplet Pen Needles 31G X 6 MM Misc Generic drug: Insulin Pen Needle USE ONE TIME DAILY  AT  BEDTIME  WITH  LANTUS   Eliquis 5 MG Tabs tablet Generic drug: apixaban TAKE 1 TABLET TWICE DAILY   Flonase 50 MCG/ACT nasal spray Generic drug: fluticasone Place 1 spray into both nostrils daily as needed for allergies.   furosemide 20 MG tablet Commonly known as: LASIX Take 20 mg by mouth daily.   isosorbide mononitrate 30 MG 24 hr  tablet Commonly known as: IMDUR Take 3 tablets (90 mg total) by mouth daily. Start taking on: December 23, 2022 What changed:  medication strength how much to take   Lantus SoloStar 100 UNIT/ML Solostar Pen Generic drug: insulin glargine Inject 50 Units into the skin at bedtime. What changed: how much to take   metFORMIN 1000 MG tablet Commonly known as: GLUCOPHAGE TAKE 1 TABLET TWICE DAILY WITH MEALS   metoprolol tartrate 25 MG tablet Commonly known as: LOPRESSOR TAKE 1/2 TABLET TWICE DAILY   nitroGLYCERIN 0.4 MG SL tablet Commonly known as: Nitrostat Place 1 tablet (0.4 mg total) under the tongue every 5 (five) minutes as needed for chest pain.   rosuvastatin 40 MG tablet Commonly known as: CRESTOR TAKE 1 TABLET EVERY DAY   True Metrix Air Glucose Meter w/Device Kit 1 kit by Other route 2 (two) times daily. Check blood sugar twice daily and as directed. Dx E11.65   True Metrix Blood Glucose Test test strip Generic drug: glucose blood CHECK BLOOD SUGAR TWICE DAILY   True Metrix Level 1 Low Soln Use to check control on glucose meter   TRUEplus Lancets 30G Misc Check blood sugar twice daily and as directed. Dx E11.65   Vitamin B12 1000 MCG Tbcr Take 1,000 mcg by mouth daily.        Follow-up Information     Lewayne Bunting, MD. Schedule an appointment as soon as possible for a visit in 1 week(s).   Specialty: Cardiology Contact information: [address] [place] [address] [place] [address] [phone]                Discharge Exam: [name] Weights   12/21/22 0313  Weight: 112 kg  Obese, alert and oriented x3 Resp: clear to auscultation CV s1s2 normal no murmur   Condition at discharge: fair  The results of significant diagnostics from this hospitalization (including imaging, microbiology, ancillary and laboratory) are listed below for reference.   Imaging Studies: CT ABDOMEN PELVIS W CONTRAST  Result Date: 12/21/2022 CLINICAL DATA:   Abdominal pain.  History of colon cancer. EXAM: CT ABDOMEN AND PELVIS WITH CONTRAST TECHNIQUE: Multidetector CT imaging of the abdomen and pelvis was performed using the standard protocol following bolus administration of intravenous contrast. RADIATION DOSE REDUCTION: This exam was performed according to the departmental dose-optimization program which includes automated exposure control, adjustment of the mA and/or kV according to patient size and/or use of iterative reconstruction technique. CONTRAST:  OMNIPAQUE IOHEXOL 350 MG/ML SOLN COMPARISON:  08/08/2022 FINDINGS: Lower chest: Calcified right lower lobe granuloma. Hepatobiliary: Tiny hypodensity in the anterior liver is too small to characterize but likely benign. No followup imaging is recommended. 17 mm hypervascular lesion in the inferior tip of the right liver with apparent early opacification of the right hepatic vein is compatible with vascular malformation. Calcified  gallstones measure up to 9 mm. No intrahepatic or extrahepatic biliary dilation. Pancreas: No focal mass lesion. No dilatation of the main duct. No intraparenchymal cyst. No peripancreatic edema. Spleen: No splenomegaly. No suspicious focal mass lesion. Adrenals/Urinary Tract: No adrenal nodule or mass. 2.6 x 2.3 cm heterogeneously enhancing lesion in the upper pole right kidney is compatible with neoplasm, and has been characterized on multiple prior imaging studies. Large simple cysts are noted in both kidneys, as before. No evidence for hydroureter. The urinary bladder appears normal for the degree of distention. Stomach/Bowel: Stomach is unremarkable. No gastric wall thickening. No evidence of outlet obstruction. Duodenum is normally positioned as is the ligament of Treitz. Duodenal diverticulum noted. No small bowel wall thickening. No small bowel dilatation. Right hemicolectomy. Diverticular changes are noted in the left colon without evidence of diverticulitis.  Vascular/Lymphatic: There is moderate atherosclerotic calcification of the abdominal aorta without aneurysm. There is no gastrohepatic or hepatoduodenal ligament lymphadenopathy. No retroperitoneal or mesenteric lymphadenopathy. No pelvic sidewall lymphadenopathy. Reproductive: The prostate gland and seminal vesicles are unremarkable. Other: No intraperitoneal free fluid. Musculoskeletal: No worrisome lytic or sclerotic osseous abnormality. IMPRESSION: 1. No acute findings in the abdomen or pelvis. Specifically, no findings to explain the patient's history of abdominal pain. 2. 2.6 x 2.3 cm heterogeneously enhancing lesion in the upper pole right kidney is compatible with neoplasm and has been characterized on multiple prior imaging studies. 3. 17 mm hypervascular lesion in the inferior tip of the right liver with apparent early opacification of the right hepatic vein is compatible with vascular malformation. 4. Cholelithiasis. 5.  Aortic Atherosclerosis (ICD10-I70.0). Electronically Signed   By: Kennith Center M.D.   On: 12/21/2022 06:25   DG Chest Port 1 View  Result Date: 12/21/2022 CLINICAL DATA:  Chest pain EXAM: PORTABLE CHEST 1 VIEW COMPARISON:  08/08/2022 FINDINGS: Cardiac shadow is prominent but stable. Lungs are well aerated bilaterally. No focal infiltrate is noted. No bony abnormality is seen. IMPRESSION: No acute abnormality noted. Electronically Signed   By: Alcide Clever M.D.   On: 12/21/2022 03:41    Microbiology: Results for orders placed or performed during the hospital encounter of 08/17/22  MRSA Next Gen by PCR, Nasal     Status: None   Collection Time: 08/17/22  6:49 PM   Specimen: Nasal Mucosa; Nasal Swab  Result Value Ref Range Status   MRSA by PCR Next Gen NOT DETECTED NOT DETECTED Final    Comment: (NOTE) The GeneXpert MRSA Assay (FDA approved for NASAL specimens only), is one component of a comprehensive MRSA colonization surveillance program. It is not intended to diagnose  MRSA infection nor to guide or monitor treatment for MRSA infections. Test performance is not FDA approved in patients less than 93 years old. Performed at [facility], [address] [place], [place] [address]     Labs: CBC: Recent Labs  Lab 12/21/22 0315  WBC 6.2  HGB 13.0  HCT 41.0  MCV 92.8  PLT 147*   Basic Metabolic Panel: Recent Labs  Lab 12/21/22 0315  NA 139  K 3.8  CL 108  CO2 24  GLUCOSE 168*  BUN 22  CREATININE 1.10  CALCIUM 8.4*   Liver Function Tests: Recent Labs  Lab 12/21/22 0315  AST 20  ALT 22  ALKPHOS 80  BILITOT 1.1  PROT 6.6  ALBUMIN 3.8   CBG: Recent Labs  Lab 12/21/22 1900 12/21/22 2032 12/22/22 0920  GLUCAP 265* 279* 225*    Discharge time  spent: greater than 30 minutes.  Signed: Enedina Finner, MD [facility] 12/22/2022

## 2022-12-22 NOTE — Discharge Instructions (Signed)
Return to ER/Urgent care if signs symptoms return

## 2022-12-22 NOTE — Plan of Care (Signed)
Pt discharged to home, pt A&OX4. IV removed, tele removed. AVS reviewed follow up questions answered. BP (!) 121/56 (BP Location: Right Arm)   Pulse 75   Temp 99 F (37.2 C)   Resp 18   Ht 5\' 11"  (1.803 m)   Wt 112 kg   SpO2 93%   BMI 34.45 kg/m  Pt escorted off the floor by staff. No c/o of s/s of chest pain, N/v Diarehea of stomach pain Reva Bores 12/22/22 11:02 AM

## 2022-12-25 ENCOUNTER — Inpatient Hospital Stay: Payer: Medicare HMO | Attending: Oncology | Admitting: Oncology

## 2022-12-25 ENCOUNTER — Inpatient Hospital Stay: Payer: Medicare HMO

## 2022-12-25 VITALS — BP 137/70 | HR 68 | Temp 98.1°F | Resp 18 | Ht 71.0 in | Wt 240.7 lb

## 2022-12-25 DIAGNOSIS — E1142 Type 2 diabetes mellitus with diabetic polyneuropathy: Secondary | ICD-10-CM | POA: Diagnosis not present

## 2022-12-25 DIAGNOSIS — E119 Type 2 diabetes mellitus without complications: Secondary | ICD-10-CM | POA: Insufficient documentation

## 2022-12-25 DIAGNOSIS — C182 Malignant neoplasm of ascending colon: Secondary | ICD-10-CM

## 2022-12-25 DIAGNOSIS — I10 Essential (primary) hypertension: Secondary | ICD-10-CM | POA: Diagnosis not present

## 2022-12-25 DIAGNOSIS — N400 Enlarged prostate without lower urinary tract symptoms: Secondary | ICD-10-CM | POA: Diagnosis not present

## 2022-12-25 DIAGNOSIS — I2510 Atherosclerotic heart disease of native coronary artery without angina pectoris: Secondary | ICD-10-CM | POA: Insufficient documentation

## 2022-12-25 DIAGNOSIS — D509 Iron deficiency anemia, unspecified: Secondary | ICD-10-CM | POA: Diagnosis not present

## 2022-12-25 DIAGNOSIS — E1122 Type 2 diabetes mellitus with diabetic chronic kidney disease: Secondary | ICD-10-CM | POA: Diagnosis not present

## 2022-12-25 DIAGNOSIS — Z85038 Personal history of other malignant neoplasm of large intestine: Secondary | ICD-10-CM | POA: Diagnosis not present

## 2022-12-25 DIAGNOSIS — N2889 Other specified disorders of kidney and ureter: Secondary | ICD-10-CM | POA: Insufficient documentation

## 2022-12-25 DIAGNOSIS — Z9049 Acquired absence of other specified parts of digestive tract: Secondary | ICD-10-CM | POA: Diagnosis not present

## 2022-12-25 DIAGNOSIS — N1831 Chronic kidney disease, stage 3a: Secondary | ICD-10-CM | POA: Diagnosis not present

## 2022-12-25 DIAGNOSIS — I480 Paroxysmal atrial fibrillation: Secondary | ICD-10-CM | POA: Diagnosis not present

## 2022-12-25 DIAGNOSIS — Z794 Long term (current) use of insulin: Secondary | ICD-10-CM | POA: Diagnosis not present

## 2022-12-25 DIAGNOSIS — E1159 Type 2 diabetes mellitus with other circulatory complications: Secondary | ICD-10-CM | POA: Diagnosis not present

## 2022-12-25 LAB — CBC WITH DIFFERENTIAL (CANCER CENTER ONLY)
Abs Immature Granulocytes: 0.02 10*3/uL (ref 0.00–0.07)
Basophils Absolute: 0 10*3/uL (ref 0.0–0.1)
Basophils Relative: 0 %
Eosinophils Absolute: 0.3 10*3/uL (ref 0.0–0.5)
Eosinophils Relative: 4 %
HCT: 40.5 % (ref 39.0–52.0)
Hemoglobin: 13.2 g/dL (ref 13.0–17.0)
Immature Granulocytes: 0 %
Lymphocytes Relative: 25 %
Lymphs Abs: 1.6 10*3/uL (ref 0.7–4.0)
MCH: 29.4 pg (ref 26.0–34.0)
MCHC: 32.6 g/dL (ref 30.0–36.0)
MCV: 90.2 fL (ref 80.0–100.0)
Monocytes Absolute: 0.7 10*3/uL (ref 0.1–1.0)
Monocytes Relative: 11 %
Neutro Abs: 3.9 10*3/uL (ref 1.7–7.7)
Neutrophils Relative %: 60 %
Platelet Count: 176 10*3/uL (ref 150–400)
RBC: 4.49 MIL/uL (ref 4.22–5.81)
RDW: 17.2 % — ABNORMAL HIGH (ref 11.5–15.5)
WBC Count: 6.5 10*3/uL (ref 4.0–10.5)
nRBC: 0 % (ref 0.0–0.2)

## 2022-12-25 LAB — CEA (ACCESS): CEA (CHCC): 2.62 ng/mL (ref 0.00–5.00)

## 2022-12-25 NOTE — Progress Notes (Signed)
  Ryan Garcia   Diagnosis: Colon cancer  INTERVAL HISTORY:   Ryan Garcia returns as scheduled.  He generally feels well.  Good appetite.  No difficulty with bowel function.  No bleeding. He was admitted 12/21/2022 with chest pain.  Troponins were negative.  He was discharged to home on 12/22/2022.  He continues medical therapy for coronary artery disease.   Objective:  Vital signs in last 24 hours:  Blood pressure 137/70, pulse 68, temperature 98.1 F (36.7 C), temperature source Oral, resp. rate 18, height 5\' 11"  (1.803 m), weight 240 lb 11.2 oz (109.2 kg), SpO2 100 %.    Lymphatics: No cervical, supraclavicular, or inguinal nodes.  Prominent bilateral axillary fat pads. Resp: Lungs clear bilaterally Cardio: Irregular GI: No mass, nontender, no hepatosplenomegaly Vascular: No leg edema 80   Lab Results:  Lab Results  Component Value Date   WBC 6.2 12/21/2022   HGB 13.0 12/21/2022   HCT 41.0 12/21/2022   MCV 92.8 12/21/2022   PLT 147 (L) 12/21/2022   NEUTROABS 3.5 09/12/2022    CMP  Lab Results  Component Value Date   NA 139 12/21/2022   K 3.8 12/21/2022   CL 108 12/21/2022   CO2 24 12/21/2022   GLUCOSE 168 (H) 12/21/2022   BUN 22 12/21/2022   CREATININE 1.10 12/21/2022   CALCIUM 8.4 (L) 12/21/2022   PROT 6.6 12/21/2022   ALBUMIN 3.8 12/21/2022   AST 20 12/21/2022   ALT 22 12/21/2022   ALKPHOS 80 12/21/2022   BILITOT 1.1 12/21/2022   GFRNONAA >60 12/21/2022   GFRAA >60 07/03/2018    Lab Results  Component Value Date   CEA1 6.1 (H) 07/31/2022   CEA 2.38 08/24/2022   Medications: I have reviewed the patient's current medications.   Assessment/Plan:  Right colon cancer, stage III B (pT3,pN1a), status post a right colectomy 08/03/2022 1/18 lymph nodes, one tumor deposit, tumor focally invades pericolonic soft tissue, MSI-high, loss of MLH1 and PMS2 expression, MLH1 hypermethylation present CTs 07/31/2022-no evidence of  metastatic disease, stable right renal mass Elevated preoperative CEA CT Abdo/pelvis 12/21/2022-no acute findings, 17 mm hypervascular right liver lesion compatible with vascular malformation Colonoscopy 08/01/2022-ascending colon mass-invasive moderately differentiated adenocarcinoma, transverse colon polyps-tubular adenomas with minute fragments of moderately differentiated adenocarcinoma (felt to likely be a contaminant from the ascending mass biopsy), sigmoid and rectal polyps-tubular adenoma and hyperplastic polyps Coronary artery disease Demand ischemia versus NSTEMI 07/31/2022 Inferior STEMI, status post PCI to distal RCA 08/17/2022  4.   Paroxysmal atrial fibrillation 5.  Right renal mass-stable on CT 07/31/2022, consistent with a renal cell carcinoma CT abdomen/pelvis 12/21/2022 -2.6 x 2.3 cm upper pole right renal mass 6.  BPH 7.  Hypertension 8.  Iron deficiency anemia January 2024  9.  Diabetes     Disposition: Ryan Garcia is in clinical remission from colon cancer.  We will follow-up on the CEA from today.  He will return for an office and lab visit in 6 months.  He will continue follow-up with Dr. Jens Som for management of coronary artery disease.  He is followed by urology for the right renal mass.  Thornton Papas, MD  12/25/2022  3:28 PM

## 2022-12-26 ENCOUNTER — Telehealth: Payer: Self-pay

## 2022-12-26 ENCOUNTER — Telehealth: Payer: Self-pay | Admitting: Cardiology

## 2022-12-26 NOTE — Telephone Encounter (Signed)
Pt's wife states the pt was seen recently in the hospital due to thinking he was having another heart attack. All tests were negative. Pt was prescribed Imdur 90 mg daily. She states "Nobody told us we were getting that medication and the nurse didn't go over it. We just want to make sure it's okay with Dr. Jens Som before he takes it. He will not take it until Dr. Jens Som says it's okay."

## 2022-12-26 NOTE — Telephone Encounter (Signed)
Left message for pt to call.

## 2022-12-26 NOTE — Telephone Encounter (Signed)
Follow Up:       Patient's wife is returning a call from today.

## 2022-12-26 NOTE — Telephone Encounter (Signed)
Left voicemail message for the pt to call the clinic. 

## 2022-12-26 NOTE — Telephone Encounter (Signed)
-----   Message from Ladene Artist, MD sent at 12/25/2022  7:59 PM EDT ----- Please call patient, the cancer marker(CEA) is normal, f/u as scheduled

## 2022-12-26 NOTE — Telephone Encounter (Signed)
Pt c/o medication issue:  1. Name of Medication: isosorbide mononitrate (IMDUR) 30 MG 24 hr tablet    2. How are you currently taking this medication (dosage and times per day)? Take 3 tablets (90 mg total) by mouth daily.  3. Are you having a reaction (difficulty breathing--STAT)? No   4. What is your medication issue? Patient want to know if it is ok if he take it due to all the other medications he is taken

## 2022-12-26 NOTE — Telephone Encounter (Signed)
Wife was returning call. Please advise ?

## 2022-12-26 NOTE — Telephone Encounter (Signed)
Patient gave verbal understanding and had no further questions or concerns  

## 2022-12-26 NOTE — Telephone Encounter (Signed)
Call and goes straight to VM.  LVM to return call to office

## 2022-12-27 NOTE — Telephone Encounter (Signed)
Unable to reach pt or leave a message  

## 2022-12-27 NOTE — Telephone Encounter (Signed)
Spoke with pt wife, Aware of dr crenshaw's recommendations.  

## 2022-12-31 ENCOUNTER — Other Ambulatory Visit: Payer: Self-pay | Admitting: Cardiology

## 2023-01-04 NOTE — Progress Notes (Deleted)
Cardiology Clinic Note   Patient Name: Ryan Garcia Date of Encounter: 01/04/2023  Primary Care Provider:  Judy Pimple, MD Primary Cardiologist:  Olga Millers, MD  Patient Profile    Ryan Garcia 89 year old male presents the clinic today for follow-up evaluation of his coronary artery disease status post STEMI involving his right coronary artery 08/17/2022  Past Medical History    Past Medical History:  Diagnosis Date   AC (acromioclavicular) joint bone spurs    lt ankle   Allergy    allergic rhinitis   Anginal pain (HCC)    Arthritis    OA   BPH (benign prostatic hyperplasia)    microwave tx of prostate   CAD (coronary artery disease)    a. s/p cath in 2012 showing 70-75% LAD stenosis, 75% D1 stenosis, 75% OM1, 60-70% RCA stenosis, and 80% PDA --> medical therapy pursued b. similar findings by cath in 2016; 06/2017 DES to distal RCA   Chronic kidney disease    Chronic upper back pain    Diabetes mellitus    type II x8 years   GERD (gastroesophageal reflux disease)    HLD (hyperlipidemia)    10/12: TC 208, TG 213, HDL 36, LDL 129   Hx of skin cancer, basal cell    many skin cancers removed/under constant treatment.   Hypertension    NSTEMI (non-ST elevated myocardial infarction) (HCC) 06/18/2017   DES to distal RCA   Obstructive sleep apnea on CPAP    Paroxysmal atrial flutter (HCC)    Renal mass    Scoliosis    Past Surgical History:  Procedure Laterality Date   BIOPSY  08/01/2022   Procedure: BIOPSY;  Surgeon: Beverley Fiedler, MD;  Location: [facility] ENDOSCOPY;  Service: Gastroenterology;;   BREAST SURGERY  2009   breast lump removed benign   BUBBLE STUDY  10/14/2020   Procedure: BUBBLE STUDY;  Surgeon: Parke Poisson, MD;  Location: [facility] ENDOSCOPY;  Service: Cardiovascular;;   CARDIAC CATHETERIZATION N/A 03/15/2015   Procedure: Left Heart Cath and Coronary Angiography;  Surgeon: Lyn Records, MD;  Location: [facility] INVASIVE CV LAB;  Service: Cardiovascular;   Laterality: N/A;   CARDIOVERSION N/A 10/14/2020   Procedure: CARDIOVERSION;  Surgeon: Parke Poisson, MD;  Location: [facility] ENDOSCOPY;  Service: Cardiovascular;  Laterality: N/A;   CATARACT EXTRACTION W/PHACO Right 11/29/2021   Procedure: CATARACT EXTRACTION PHACO AND INTRAOCULAR LENS PLACEMENT (IOC) RIGHT DIABETIC;  Surgeon: Lockie Mola, MD;  Location: [facility];  Service: Ophthalmology;  Laterality: Right;  Diabetic 18.72 01:54.3   COLONOSCOPY N/A 08/01/2022   Procedure: COLONOSCOPY;  Surgeon: Beverley Fiedler, MD;  Location: [facility] ENDOSCOPY;  Service: Gastroenterology;  Laterality: N/A;   CORONARY STENT INTERVENTION N/A 06/18/2017   Procedure: CORONARY STENT INTERVENTION;  Surgeon: Corky Crafts, MD;  Location: [facility] INVASIVE CV LAB;  Service: Cardiovascular;  Laterality: N/A;  RCA   CORONARY/GRAFT ACUTE MI REVASCULARIZATION N/A 08/17/2022   Procedure: Coronary/Graft Acute MI Revascularization;  Surgeon: Alwyn Pea, MD;  Location: [facility] INVASIVE CV LAB;  Service: Cardiovascular;  Laterality: N/A;   ESOPHAGOGASTRODUODENOSCOPY (EGD) WITH PROPOFOL N/A 08/01/2022   Procedure: ESOPHAGOGASTRODUODENOSCOPY (EGD) WITH PROPOFOL;  Surgeon: Beverley Fiedler, MD;  Location: [facility] ENDOSCOPY;  Service: Gastroenterology;  Laterality: N/A;   HEMOSTASIS CLIP PLACEMENT  08/01/2022   Procedure: HEMOSTASIS CLIP PLACEMENT;  Surgeon: Beverley Fiedler, MD;  Location: [facility] ENDOSCOPY;  Service: Gastroenterology;;   LAPAROSCOPIC LYSIS OF ADHESIONS N/A 08/03/2022   Procedure: LAPAROSCOPIC  LYSIS OF ADHESIONS;  Surgeon: Griselda Miner, MD;  Location: [facility] OR;  Service: General;  Laterality: N/A;   LAPAROSCOPIC PARTIAL COLECTOMY N/A 08/03/2022   Procedure: OPEN PARTIAL COLECTOMY;  Surgeon: Griselda Miner, MD;  Location: [facility] OR;  Service: General;  Laterality: N/A;   LEFT HEART CATH AND CORONARY ANGIOGRAPHY N/A 06/18/2017   Procedure: LEFT HEART CATH AND CORONARY ANGIOGRAPHY;  Surgeon: Corky Crafts, MD;  Location:  [facility] INVASIVE CV LAB;  Service: Cardiovascular;  Laterality: N/A;   LEFT HEART CATH AND CORONARY ANGIOGRAPHY N/A 04/12/2020   Procedure: LEFT HEART CATH AND CORONARY ANGIOGRAPHY;  Surgeon: Swazil[place], Peter M, MD;  Location: [facility] INVASIVE CV LAB;  Service: Cardiovascular;  Laterality: N/A;   LEFT HEART CATH AND CORONARY ANGIOGRAPHY N/A 08/17/2022   Procedure: LEFT HEART CATH AND CORONARY ANGIOGRAPHY;  Surgeon: Alwyn Pea, MD;  Location: [facility] INVASIVE CV LAB;  Service: Cardiovascular;  Laterality: N/A;   POLYPECTOMY  08/01/2022   Procedure: POLYPECTOMY;  Surgeon: Beverley Fiedler, MD;  Location: [facility] ENDOSCOPY;  Service: Gastroenterology;;   SUBMUCOSAL TATTOO INJECTION  08/01/2022   Procedure: SUBMUCOSAL TATTOO INJECTION;  Surgeon: Beverley Fiedler, MD;  Location: [facility] ENDOSCOPY;  Service: Gastroenterology;;   TEE WITHOUT CARDIOVERSION N/A 10/14/2020   Procedure: TRANSESOPHAGEAL ECHOCARDIOGRAM (TEE);  Surgeon: Parke Poisson, MD;  Location: [facility] ENDOSCOPY;  Service: Cardiovascular;  Laterality: N/A;   ULTRASOUND GUIDANCE FOR VASCULAR ACCESS  06/18/2017   Procedure: Ultrasound Guidance For Vascular Access;  Surgeon: Corky Crafts, MD;  Location: [facility] INVASIVE CV LAB;  Service: Cardiovascular;;  right radial, right femoral    Allergies  Allergies  Allergen Reactions   Loratadine Other (See Comments)    Not effective   Simvastatin Other (See Comments)    Intolerant- caused joint pain    History of Present Illness    Ryan Garcia has a PMH of paroxysmal atrial flutter on apixaban, coronary artery disease, adenocarcinoma of the colon status post surgical resection 2/24, iron deficiency anemia, HFpEF, right renal mass, HTN, HLD, OSA on CPAP.  He has had several cardiac catheterizations.  He was admitted 12/18 with NSTEMI.  Cardiac catheterization at that time showed severe three-vessel CAD with left-sided disease which was unchanged compared to prior catheterization and showed new RCA lesion.  He  underwent PCI with DES.  He had repeat cardiac catheterization 2021 which showed multivessel coronary artery disease and patent RCA stent.  Medical management was recommended with consideration for CABG if he had refractory angina.  His echocardiogram in 2022 showed normal LVEF and basal inferior hypokinesis with severe LVH.  He underwent successful TEE and DCCV 4/22.  He was admitted 1/24 at [facility] with chest discomfort which was associated with exertion.  He was noted to be anemic with a hemoglobin of 8.7.  His apixaban was held.  His high-sensitivity troponins peaked at 651.  Cardiology was consulted.  His echocardiogram showed EF 60-65% with no regional wall motion abnormalities and mildly elevated PASP.  It was felt that his elevated troponin was due to demand ischemia in the setting of acute anemia.  A chest CT, abdomen, pelvis showed 2.5 x 2.6 cm lesion (renal cell carcinoma) in the anterior right upper kidney which was unchanged from previous MRI.  He underwent EGD and colonoscopy.  Colonoscopy showed malignant tumor in his mid descending colon.  He underwent lap colectomy on 08/03/2022.  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  His apixaban was resumed prior to discharge.  He presented to for evaluation with his PCP on 08/17/2022.  After his visit approximately 1 hour later he developed acute substernal chest pressure which was associated with dyspnea.  EMS was contacted and code STEMI was activated.  His EKG showed inferior ST elevation.  He went emergently to the cardiac Cath Lab.  Cardiac catheterization showed a large RCA with total occlusion from mid to distal vessel.  His LAD was noted to have diffuse moderate-high-grade stenosis.  Circumflex was noted to have diffuse moderate-high-grade stenosis.  He underwent PCI with DES to his mid-distal RCA and was noted to have a 0% residual stenosis.  Post catheterization he was admitted to the ICU and remained  hemodynamically stable.  His hemoglobin remained stable.  He remained in sinus rhythm with frequent PACs and occasional PVCs.  He was discharged in stable condition on 08/19/2022.  He presents to the clinic today for follow-up evaluation and states he "feels ok considering". We reviewed his recent hospitalizations and cardiac cath. He ahd his wife expressed understanding. We reviewed the importance of heart health diet and continued cholesterol mngt. He reported compliance with his medications and recent labs were drawn at his pcp.  Bp 122/60. I will plan follow up in 3-4 months.  Today he denies chest pain, shortness of breath, lower extremity edema, fatigue, palpitations, melena, hematuria, hemoptysis, diaphoresis, weakness, presyncope, syncope, orthopnea, and PND.   Home Medications    Prior to Admission medications   Medication Sig Start Date End Date Taking? Authorizing Provider  acetaminophen (TYLENOL) 325 MG tablet Take 325-650 mg by mouth every 6 (six) hours as needed for mild pain or headache.    [provider]  Alcohol Swabs (B-D SINGLE USE SWABS REGULAR) PADS Use to check blood sugar 2 times a day 03/30/21   Tower, Audrie Gallus, MD  apixaban (ELIQUIS) 5 MG TABS tablet Take 1 tablet (5 mg total) by mouth 2 (two) times daily. 11/22/21   Lewayne Bunting, MD  Blood Glucose Calibration (TRUE METRIX LEVEL 1) Low SOLN Use to check control on glucose meter 03/30/21   Tower, Audrie Gallus, MD  Blood Glucose Monitoring Suppl (TRUE METRIX AIR GLUCOSE METER) w/Device KIT 1 kit by Other route 2 (two) times daily. Check blood sugar twice daily and as directed. Dx E11.65 03/30/21   Tower, Audrie Gallus, MD  clopidogrel (PLAVIX) 75 MG tablet Take 1 tablet (75 mg total) by mouth daily. 08/20/22 09/19/22  Delfino Lovett, MD  Coenzyme Q10 (CO Q 10 PO) Take 200 mg by mouth daily.    [provider]  Cyanocobalamin (VITAMIN B12) 1000 MCG TBCR Take 1,000 mcg by mouth daily. 04/12/20   Arty Baumgartner, NP   DROPLET PEN NEEDLES 31G X 6 MM MISC USE ONE TIME DAILY  AT  BEDTIME  WITH  LANTUS 01/01/22   Tower, Audrie Gallus, MD  fluticasone (FLONASE) 50 MCG/ACT nasal spray Place 1 spray into both nostrils daily as needed for allergies.     [provider]  furosemide (LASIX) 20 MG tablet Take 20 mg by mouth daily. 07/18/22   [provider]  glipiZIDE (GLUCOTROL) 10 MG tablet TAKE 1 TABLET TWICE DAILY WITH MEALS 08/16/22   Tower, [place] A, MD  insulin glargine (LANTUS SOLOSTAR) 100 UNIT/ML Solostar Pen Inject 50 Units into the skin at bedtime. Patient taking differently: Inject 51 Units into the skin at bedtime. 10/11/21   Tower, Audrie Gallus, MD  isosorbide mononitrate (IMDUR) 60 MG 24 hr tablet TAKE 1 TABLET  EVERY DAY Patient taking differently: Take 60 mg by mouth daily. 02/05/22   Lewayne Bunting, MD  metFORMIN (GLUCOPHAGE) 1000 MG tablet TAKE 1 TABLET TWICE DAILY WITH MEALS Patient taking differently: Take 1,000 mg by mouth 2 (two) times daily. 10/25/21   Tower, Audrie Gallus, MD  metoprolol tartrate (LOPRESSOR) 25 MG tablet Take 0.5 tablets (12.5 mg total) by mouth 2 (two) times daily. 12/12/21   Lewayne Bunting, MD  nitroGLYCERIN (NITROSTAT) 0.4 MG SL tablet Place 1 tablet (0.4 mg total) under the tongue every 5 (five) minutes as needed for chest pain. 05/11/19   Duke, Roe Rutherford, PA  NOVOLOG FLEXPEN 100 UNIT/ML FlexPen Inject 16 Units into the skin daily. Before dinner 09/14/21   [provider]  pantoprazole (PROTONIX) 40 MG tablet Take 1 tablet (40 mg total) by mouth daily at 6 (six) AM. 08/12/22 10/11/22  Zigmund Daniel., MD  rosuvastatin (CRESTOR) 40 MG tablet Take 1 tablet (40 mg total) by mouth daily. 01/11/22 07/31/22  Lewayne Bunting, MD  TRUE METRIX BLOOD GLUCOSE TEST test strip CHECK BLOOD SUGAR TWICE DAILY 01/26/22   Tower, Audrie Gallus, MD  TRUEplus Lancets 30G MISC Check blood sugar twice daily and as directed. Dx E11.65 03/30/21   Judy Pimple, MD    Family History    Family  History  Problem Relation Age of Onset   Heart attack Brother    Colon cancer Neg Hx    Colon polyps Neg Hx    Stomach cancer Neg Hx    Esophageal cancer Neg Hx    He indicated that his mother is deceased. He indicated that his father is deceased. He indicated that one of his two brothers is deceased. He indicated that his maternal grandmother is deceased. He indicated that his maternal grandfather is deceased. He indicated that his paternal grandmother is deceased. He indicated that his paternal grandfather is deceased. He indicated that the status of his neg hx is unknown.  Social History    Social History   Socioeconomic History   Marital status: Married    Spouse name: Demarlo Bhola   Number of children: 3   Years of education: Not on file   Highest education level: Not on file  Occupational History   Not on file  Tobacco Use   Smoking status: Never    Passive exposure: Never   Smokeless tobacco: Never  Vaping Use   Vaping Use: Never used  Substance and Sexual Activity   Alcohol use: No    Alcohol/week: 0.0 standard drinks of alcohol   Drug use: No   Sexual activity: Not Currently  Other Topics Concern   Not on file  Social History Narrative   Married    Physically active hard of hearing   Social Determinants of Health   Financial Resource Strain: Low Risk  (06/29/2021)   Overall Financial Resource Strain (CARDIA)    Difficulty of Paying Living Expenses: Not hard at all  Food Insecurity: No Food Insecurity (12/21/2022)   Hunger Vital Sign    Worried About Running Out of Food in the Last Year: Never true    Ran Out of Food in the Last Year: Never true  Transportation Needs: No Transportation Needs (12/21/2022)   PRAPARE - Administrator, Civil Service (Medical): No    Lack of Transportation (Non-Medical): No  Physical Activity: Inactive (06/29/2021)   Exercise Vital Sign    Days of Exercise per Week: 0 days  Minutes of Exercise per Session: 0 min   Stress: No Stress Concern Present (06/29/2021)   [employer] of Occupational Health - Occupational Stress Questionnaire    Feeling of Stress : Not at all  Social Connections: Moderately Integrated (06/29/2021)   Social Connection and Isolation Panel [NHANES]    Frequency of Communication with Friends and Family: More than three times a week    Frequency of Social Gatherings with Friends and Family: More than three times a week    Attends Religious Services: More than 4 times per year    Active Member of [employer] or Organizations: No    Attends Banker Meetings: Never    Marital Status: Married  Catering manager Violence: Not At Risk (12/21/2022)   Humiliation, Afraid, Rape, and Kick questionnaire    Fear of Current or Ex-Partner: No    Emotionally Abused: No    Physically Abused: No    Sexually Abused: No     Review of Systems    General:  No chills, fever, night sweats or weight changes.  Cardiovascular:  No chest pain, dyspnea on exertion, edema, orthopnea, palpitations, paroxysmal nocturnal dyspnea. Dermatological: No rash, lesions/masses Respiratory: No cough, dyspnea Urologic: No hematuria, dysuria Abdominal:   No nausea, vomiting, diarrhea, bright red blood per rectum, melena, or hematemesis Neurologic:  No visual changes, wkns, changes in mental status. All other systems reviewed and are otherwise negative except as noted above.  Physical Exam    VS:  There were no vitals taken for this visit. , BMI There is no height or weight on file to calculate BMI. GEN: Well nourished, well developed, in no acute distress. HEENT: normal. Neck: Supple, no JVD, carotid bruits, or masses. Cardiac: RRR, no murmurs, rubs, or gallops. No clubbing, cyanosis, edema.  Radials/DP/PT 2+ and equal bilaterally.  Respiratory:  Respirations regular and unlabored, clear to auscultation bilaterally. GI: Soft, nontender, nondistended, BS + x 4. MS: no deformity or atrophy. Skin:  warm and dry, no rash. Neuro:  Strength and sensation are intact. Psych: Normal affect.  Accessory Clinical Findings    Recent Labs: 08/08/2022: B Natriuretic Peptide 292.0 08/11/2022: Magnesium 2.2 08/17/2022: TSH 7.730 12/21/2022: ALT 22; BUN 22; Creatinine, Ser 1.10; Potassium 3.8; Sodium 139 12/25/2022: Hemoglobin 13.2; Platelet Count 176   Recent Lipid Panel    Component Value Date/Time   CHOL 86 07/31/2022 0447   CHOL 126 09/15/2019 0810   TRIG 95 07/31/2022 0447   HDL 29 (L) 07/31/2022 0447   HDL 33 (L) 09/15/2019 0810   CHOLHDL 3.0 07/31/2022 0447   VLDL 19 07/31/2022 0447   LDLCALC 38 07/31/2022 0447   LDLCALC 59 09/15/2019 0810   LDLDIRECT 78.5 11/17/2012 0936    No BP recorded.  {Refresh Note OR Click here to enter BP  :1}***    ECG personally reviewed by me today- sinus rhythm with marked sinus arrhythmia with first-degree AV block with junctional escape complexes 61 bpm - No acute changes  Echocardiogram 08/18/2022 IMPRESSIONS     1. Left ventricular ejection fraction, by estimation, is 60 to 65%. The  left ventricle has normal function. The left ventricle has no regional  wall motion abnormalities. There is mild left ventricular hypertrophy.  Left ventricular diastolic parameters  were normal.   2. Right ventricular systolic function is normal. The right ventricular  size is normal. There is normal pulmonary artery systolic pressure.   3. The mitral valve is normal in structure. Trivial mitral valve  regurgitation.  4. The aortic valve is tricuspid. Aortic valve regurgitation is not  visualized.   5. Aortic dilatation noted. There is mild dilatation of the ascending  aorta, measuring 39 mm.   6. The inferior vena cava is normal in size with greater than 50%  respiratory variability, suggesting right atrial pressure of 3 mmHg.   FINDINGS   Left Ventricle: Left ventricular ejection fraction, by estimation, is 60  to 65%. The left ventricle has normal  function. The left ventricle has no  regional wall motion abnormalities. The left ventricular internal cavity  size was normal in size. There is   mild left ventricular hypertrophy. Left ventricular diastolic parameters  were normal.   Right Ventricle: The right ventricular size is normal. No increase in  right ventricular wall thickness. Right ventricular systolic function is  normal. There is normal pulmonary artery systolic pressure. The tricuspid  regurgitant velocity is 2.58 m/s, and   with an assumed right atrial pressure of 3 mmHg, the estimated right  ventricular systolic pressure is 29.6 mmHg.   Left Atrium: Left atrial size was normal in size.   Right Atrium: Right atrial size was normal in size.   Pericardium: There is no evidence of pericardial effusion.   Mitral Valve: The mitral valve is normal in structure. Trivial mitral  valve regurgitation. MV peak gradient, 4.5 mmHg. The mean mitral valve  gradient is 3.0 mmHg.   Tricuspid Valve: The tricuspid valve is normal in structure. Tricuspid  valve regurgitation is not demonstrated.   Aortic Valve: The aortic valve is tricuspid. Aortic valve regurgitation is  not visualized. Aortic valve mean gradient measures 6.0 mmHg. Aortic valve  peak gradient measures 9.7 mmHg. Aortic valve area, by VTI measures 2.92  cm.   Pulmonic Valve: The pulmonic valve was not well visualized. Pulmonic valve  regurgitation is not visualized.   Aorta: The aortic root is normal in size and structure and aortic  dilatation noted. There is mild dilatation of the ascending aorta,  measuring 39 mm.   Venous: The inferior vena cava is normal in size with greater than 50%  respiratory variability, suggesting right atrial pressure of 3 mmHg.   IAS/Shunts: No atrial level shunt detected by color flow Doppler.    Cardiac catheterization 08/17/2022    Prox LAD lesion is 85% stenosed.   Dist LAD lesion is 65% stenosed.   Prox LAD to Mid LAD  lesion is 75% stenosed.   Ost Cx to Mid Cx lesion is 80% stenosed.   Mid Cx to Dist Cx lesion is 65% stenosed.   Prox RCA to Mid RCA lesion is 30% stenosed.   Ost 1st Diag to 1st Diag lesion is 85% stenosed.   Ost 1st Mrg to 1st Mrg lesion is 95% stenosed.   2nd Mrg lesion is 75% stenosed.   RPDA lesion is 60% stenosed.   1st RPL lesion is 70% stenosed.   Mid RCA to Dist RCA lesion is 100% stenosed.   Non-stenotic Dist RCA lesion was previously treated.   A drug-eluting stent was successfully placed using a STENT ONYX FRONTIER [id number].   Post intervention, there is a 0% residual stenosis.   The left ventricular systolic function is normal.   LV end diastolic pressure is mildly elevated.   There is mild (2+) mitral regurgitation.   STEMI presentation inferior MI Recent abdominal surgery for colon cancer as well as underlying atrial fibrillation on Eliquis Patient presented with acute onset of midsternal chest pain initial EKG by  EMS suggested inferior STEMI Patient brought to the emergency room hemodynamically stable given heparin aspirin Brilinta in the emergency room Brought to the cardiac Cath Lab Right groin approach Left ventriculogram showed preserved left ventricular function mild inferior hypo-EF around 50 to 55% mild left ventricular enlargement Coronaries Left main large with minor irregularities LAD large with diffuse moderate to severe disease which is known but appeared to be stable TIMI-3 flow Circumflex large with moderate to severe diffuse disease as well TIMI-3 flow previously known RCA large with the distal 100% occlusion IRA TIMI 0 flow lesion No significant collaterals   Dimension Successful PCI and stent of distal RCA with a 3.0 x 34 mm DES stent Postdilated with a 3.0 x 20 mm [place] balloon up to 16 atm TIMI-3 flow was restored from TIMI 0 Lesion reduced from 100% down to 0 Patient tolerated procedure well Mynx deployed to the right femoral artery No  complications Patient then transferred to ICU for recovery Cardiology management will be transferred back to [facility] in the morning he is a patient of Dr. Jens Som   Diagnostic Dominance: Right  Intervention       Assessment & Plan   1.  STEMI-no chest pain today.  Underwent emergent cardiac catheterization with PCI and DES to his mid-distal RCA.  Details above.  Cath site healing well with no signs of infection. Continue rosuvastatin, Imdur, Plavix, metoprolol Heart healthy low-sodium diet-salty 6 given Request PCP labs  Hyperlipidemia-LDL 38 on 07/31/2022.  LP(a) 57.5 on 08/18/2022 Heart healthy low-sodium high-fiber diet Continue rosuvastatin Increase physical activity as tolerated  Paroxysmal atrial flutter-EKG today shows sinus rhythm with marked sinus arrhythmia with first-degree AV block with junctional escape complexes 61 bpm.  Reports compliance with apixaban.  Denies bleeding issues. Continue apixaban, metoprolol Heart healthy low-sodium diet-salty 6 given Increase physical activity as tolerated  HFpEF-NYHA class I-2.  Euvolemic.  Weight stable.  Echocardiogram 08/18/2022 showed EF of 60-65%, trivial mitral valve regurgitation and mild left ventricular hypertrophy. Continue Imdur, metoprolol, furosemide Heart healthy low-sodium diet Daily weights  Ascending colon neoplasm, right renal mass-status post laparoscopic colectomy 08/03/22.  Renal mass concerning for cancer Follows with oncology  Disposition: Follow-up with Dr. Jens Som in 3-4 months.   Thomasene Ripple.  NP-C     01/04/2023, 7:31 AM [facility] [address] Office [phone] Fax [phone]    I spent 14 minutes examining this patient, reviewing medications, and using patient centered shared decision making involving her cardiac care.  Prior to her visit I spent greater than 20 minutes reviewing her past medical history,  medications, and prior cardiac tests.

## 2023-01-07 ENCOUNTER — Ambulatory Visit: Payer: Medicare HMO | Admitting: General Practice

## 2023-01-09 DIAGNOSIS — D485 Neoplasm of uncertain behavior of skin: Secondary | ICD-10-CM | POA: Diagnosis not present

## 2023-01-09 DIAGNOSIS — C44319 Basal cell carcinoma of skin of other parts of face: Secondary | ICD-10-CM | POA: Diagnosis not present

## 2023-01-09 DIAGNOSIS — L570 Actinic keratosis: Secondary | ICD-10-CM | POA: Diagnosis not present

## 2023-01-09 DIAGNOSIS — D235 Other benign neoplasm of skin of trunk: Secondary | ICD-10-CM | POA: Diagnosis not present

## 2023-01-09 DIAGNOSIS — D225 Melanocytic nevi of trunk: Secondary | ICD-10-CM | POA: Diagnosis not present

## 2023-01-14 ENCOUNTER — Ambulatory Visit: Payer: Medicare HMO | Attending: General Practice | Admitting: Nurse Practitioner

## 2023-01-14 ENCOUNTER — Encounter: Payer: Self-pay | Admitting: Nurse Practitioner

## 2023-01-14 VITALS — BP 126/74 | HR 59 | Ht 71.0 in | Wt 245.8 lb

## 2023-01-14 DIAGNOSIS — Z7984 Long term (current) use of oral hypoglycemic drugs: Secondary | ICD-10-CM

## 2023-01-14 DIAGNOSIS — I4892 Unspecified atrial flutter: Secondary | ICD-10-CM

## 2023-01-14 DIAGNOSIS — I25118 Atherosclerotic heart disease of native coronary artery with other forms of angina pectoris: Secondary | ICD-10-CM | POA: Diagnosis not present

## 2023-01-14 DIAGNOSIS — Z794 Long term (current) use of insulin: Secondary | ICD-10-CM

## 2023-01-14 DIAGNOSIS — E785 Hyperlipidemia, unspecified: Secondary | ICD-10-CM | POA: Diagnosis not present

## 2023-01-14 DIAGNOSIS — E1165 Type 2 diabetes mellitus with hyperglycemia: Secondary | ICD-10-CM | POA: Diagnosis not present

## 2023-01-14 DIAGNOSIS — G4733 Obstructive sleep apnea (adult) (pediatric): Secondary | ICD-10-CM

## 2023-01-14 DIAGNOSIS — I10 Essential (primary) hypertension: Secondary | ICD-10-CM | POA: Diagnosis not present

## 2023-01-14 NOTE — Progress Notes (Signed)
Office Visit    Patient Name: Ryan Garcia Date of Encounter: 01/14/2023  Primary Care Provider:  Judy Pimple, MD Primary Cardiologist:  Olga Millers, MD  Chief Complaint    89 year old male with a history of CAD s/p DESx2-RCA, paroxysmal atrial fibrillation on Eliquis, hypertension, hyperlipidemia, type 2 diabetes, OSA and colon cancer who presents for hospital follow-up related to CAD.  Past Medical History    Past Medical History:  Diagnosis Date   AC (acromioclavicular) joint bone spurs    lt ankle   Allergy    allergic rhinitis   Anginal pain (HCC)    Arthritis    OA   BPH (benign prostatic hyperplasia)    microwave tx of prostate   CAD (coronary artery disease)    a. s/p cath in 2012 showing 70-75% LAD stenosis, 75% D1 stenosis, 75% OM1, 60-70% RCA stenosis, and 80% PDA --> medical therapy pursued b. similar findings by cath in 2016; 06/2017 DES to distal RCA   Chronic kidney disease    Chronic upper back pain    Diabetes mellitus    type II x8 years   GERD (gastroesophageal reflux disease)    HLD (hyperlipidemia)    10/12: TC 208, TG 213, HDL 36, LDL 129   Hx of skin cancer, basal cell    many skin cancers removed/under constant treatment.   Hypertension    NSTEMI (non-ST elevated myocardial infarction) (HCC) 06/18/2017   DES to distal RCA   Obstructive sleep apnea on CPAP    Paroxysmal atrial flutter (HCC)    Renal mass    Scoliosis    Past Surgical History:  Procedure Laterality Date   BIOPSY  08/01/2022   Procedure: BIOPSY;  Surgeon: Beverley Fiedler, MD;  Location: [facility] ENDOSCOPY;  Service: Gastroenterology;;   BREAST SURGERY  2009   breast lump removed benign   BUBBLE STUDY  10/14/2020   Procedure: BUBBLE STUDY;  Surgeon: Parke Poisson, MD;  Location: [facility] ENDOSCOPY;  Service: Cardiovascular;;   CARDIAC CATHETERIZATION N/A 03/15/2015   Procedure: Left Heart Cath and Coronary Angiography;  Surgeon: Lyn Records, MD;  Location: [facility] INVASIVE CV LAB;   Service: Cardiovascular;  Laterality: N/A;   CARDIOVERSION N/A 10/14/2020   Procedure: CARDIOVERSION;  Surgeon: Parke Poisson, MD;  Location: [facility] ENDOSCOPY;  Service: Cardiovascular;  Laterality: N/A;   CATARACT EXTRACTION W/PHACO Right 11/29/2021   Procedure: CATARACT EXTRACTION PHACO AND INTRAOCULAR LENS PLACEMENT (IOC) RIGHT DIABETIC;  Surgeon: Lockie Mola, MD;  Location: [facility]ERY [facility];  Service: Ophthalmology;  Laterality: Right;  Diabetic 18.72 01:54.3   COLONOSCOPY N/A 08/01/2022   Procedure: COLONOSCOPY;  Surgeon: Beverley Fiedler, MD;  Location: [facility] ENDOSCOPY;  Service: Gastroenterology;  Laterality: N/A;   CORONARY STENT INTERVENTION N/A 06/18/2017   Procedure: CORONARY STENT INTERVENTION;  Surgeon: Corky Crafts, MD;  Location: [facility] INVASIVE CV LAB;  Service: Cardiovascular;  Laterality: N/A;  RCA   CORONARY/GRAFT ACUTE MI REVASCULARIZATION N/A 08/17/2022   Procedure: Coronary/Graft Acute MI Revascularization;  Surgeon: Alwyn Pea, MD;  Location: [facility] INVASIVE CV LAB;  Service: Cardiovascular;  Laterality: N/A;   ESOPHAGOGASTRODUODENOSCOPY (EGD) WITH PROPOFOL N/A 08/01/2022   Procedure: ESOPHAGOGASTRODUODENOSCOPY (EGD) WITH PROPOFOL;  Surgeon: Beverley Fiedler, MD;  Location: [facility] ENDOSCOPY;  Service: Gastroenterology;  Laterality: N/A;   HEMOSTASIS CLIP PLACEMENT  08/01/2022   Procedure: HEMOSTASIS CLIP PLACEMENT;  Surgeon: Beverley Fiedler, MD;  Location: [facility] ENDOSCOPY;  Service: Gastroenterology;;   LAPAROSCOPIC LYSIS OF ADHESIONS N/A 08/03/2022  Procedure: LAPAROSCOPIC LYSIS OF ADHESIONS;  Surgeon: Griselda Miner, MD;  Location: [facility] OR;  Service: General;  Laterality: N/A;   LAPAROSCOPIC PARTIAL COLECTOMY N/A 08/03/2022   Procedure: OPEN PARTIAL COLECTOMY;  Surgeon: Griselda Miner, MD;  Location: [facility] OR;  Service: General;  Laterality: N/A;   LEFT HEART CATH AND CORONARY ANGIOGRAPHY N/A 06/18/2017   Procedure: LEFT HEART CATH AND CORONARY ANGIOGRAPHY;  Surgeon: Corky Crafts, MD;  Location: [facility] INVASIVE CV LAB;  Service: Cardiovascular;  Laterality: N/A;   LEFT HEART CATH AND CORONARY ANGIOGRAPHY N/A 04/12/2020   Procedure: LEFT HEART CATH AND CORONARY ANGIOGRAPHY;  Surgeon: Swaziland, Peter M, MD;  Location: [facility] INVASIVE CV LAB;  Service: Cardiovascular;  Laterality: N/A;   LEFT HEART CATH AND CORONARY ANGIOGRAPHY N/A 08/17/2022   Procedure: LEFT HEART CATH AND CORONARY ANGIOGRAPHY;  Surgeon: Alwyn Pea, MD;  Location: [facility] INVASIVE CV LAB;  Service: Cardiovascular;  Laterality: N/A;   POLYPECTOMY  08/01/2022   Procedure: POLYPECTOMY;  Surgeon: Beverley Fiedler, MD;  Location: [facility] ENDOSCOPY;  Service: Gastroenterology;;   SUBMUCOSAL TATTOO INJECTION  08/01/2022   Procedure: SUBMUCOSAL TATTOO INJECTION;  Surgeon: Beverley Fiedler, MD;  Location: [facility] ENDOSCOPY;  Service: Gastroenterology;;   TEE WITHOUT CARDIOVERSION N/A 10/14/2020   Procedure: TRANSESOPHAGEAL ECHOCARDIOGRAM (TEE);  Surgeon: Parke Poisson, MD;  Location: [facility] ENDOSCOPY;  Service: Cardiovascular;  Laterality: N/A;   ULTRASOUND GUIDANCE FOR VASCULAR ACCESS  06/18/2017   Procedure: Ultrasound Guidance For Vascular Access;  Surgeon: Corky Crafts, MD;  Location: [facility] INVASIVE CV LAB;  Service: Cardiovascular;;  right radial, right femoral    Allergies  Allergies  Allergen Reactions   Loratadine Other (See Comments)    Not effective   Simvastatin Other (See Comments)    Intolerant- caused joint pain     Labs/Other Studies Reviewed    The following studies were reviewed today:  Cardiac Studies & Procedures   CARDIAC CATHETERIZATION  CARDIAC CATHETERIZATION 08/17/2022  Narrative   Prox LAD lesion is 85% stenosed.   Dist LAD lesion is 65% stenosed.   Prox LAD to Mid LAD lesion is 75% stenosed.   Ost Cx to Mid Cx lesion is 80% stenosed.   Mid Cx to Dist Cx lesion is 65% stenosed.   Prox RCA to Mid RCA lesion is 30% stenosed.   Ost 1st Diag to 1st Diag lesion is 85% stenosed.    Ost 1st Mrg to 1st Mrg lesion is 95% stenosed.   2nd Mrg lesion is 75% stenosed.   RPDA lesion is 60% stenosed.   1st RPL lesion is 70% stenosed.   Mid RCA to Dist RCA lesion is 100% stenosed.   Non-stenotic Dist RCA lesion was previously treated.   A drug-eluting stent was successfully placed using a STENT ONYX FRONTIER 3.0X34.   Post intervention, there is a 0% residual stenosis.   The left ventricular systolic function is normal.   LV end diastolic pressure is mildly elevated.   There is mild (2+) mitral regurgitation.  STEMI presentation inferior MI Recent abdominal surgery for colon cancer as well as underlying atrial fibrillation on Eliquis Patient presented with acute onset of midsternal chest pain initial EKG by EMS suggested inferior STEMI Patient brought to the emergency room hemodynamically stable given heparin aspirin Brilinta in the emergency room Brought to the cardiac Cath Lab Right groin approach Left ventriculogram showed preserved left ventricular function mild inferior hypo-EF around 50 to 55% mild left ventricular enlargement Coronaries Left main large  with minor irregularities LAD large with diffuse moderate to severe disease which is known but appeared to be stable TIMI-3 flow Circumflex large with moderate to severe diffuse disease as well TIMI-3 flow previously known RCA large with the distal 100% occlusion IRA TIMI 0 flow lesion No significant collaterals  Dimension Successful PCI and stent of distal RCA with a 3.0 x 34 mm DES stent Postdilated with a 3.0 x 20 mm  balloon up to 16 atm TIMI-3 flow was restored from TIMI 0 Lesion reduced from 100% down to 0 Patient tolerated procedure well Mynx deployed to the right femoral artery No complications Patient then transferred to ICU for recovery Cardiology management will be transferred back to [facility] in the morning he is a patient of Dr. Jens Som  Findings Coronary Findings Diagnostic  Dominance:  Right  Left Anterior Descending Prox LAD lesion is 85% stenosed. The lesion is segmental. Prox LAD to Mid LAD lesion is 75% stenosed. The lesion is type C and eccentric. The lesion is calcified. Dist LAD lesion is 65% stenosed. The lesion is segmental.  First Diagonal Branch Vessel is small in size. Ost 1st Diag to 1st Diag lesion is 85% stenosed. The lesion is segmental.  Left Circumflex Ost Cx to Mid Cx lesion is 80% stenosed. The lesion is type C. The lesion is calcified. Mid Cx to Dist Cx lesion is 65% stenosed.  First Obtuse Marginal Branch Vessel is small in size. Ost 1st Mrg to 1st Mrg lesion is 95% stenosed. The lesion is type C.  Second Obtuse Marginal Branch 2nd Mrg lesion is 75% stenosed. The lesion is type C.  Right Coronary Artery Prox RCA to Mid RCA lesion is 30% stenosed. The lesion is type C and eccentric. Mid RCA to Dist RCA lesion is 100% stenosed. Vessel is the culprit lesion. The lesion is type C, located at the major branch, focal and ulcerative. The lesion was previously treated using a drug eluting stent over 2 years ago. Previously placed stent displays no rethrombosis. Previously placed stent displays no restenosis. The stenosis was measured by a visual reading. Pressure wire/FFR was not performed on the lesion. IVUS was not performed. No optical coherence tomography (OCT) was performed. Non-stenotic Dist RCA lesion was previously treated. The lesion is thrombotic and ulcerative.  Right Posterior Descending Artery RPDA lesion is 60% stenosed.  Right Posterior Atrioventricular Artery Vessel is small in size.  First Right Posterolateral Branch 1st RPL lesion is 70% stenosed.  Intervention  Mid RCA to Dist RCA lesion Stent Lesion length:  30 mm. CATH VISTA GUIDE 6FR JR4 guide catheter was inserted. Lesion crossed with guidewire using a WIRE G HI TQ BMW 190. Pre-stent angioplasty was performed using a BALLN TREK RX 2.5X12. Maximum pressure:  10 atm.  Inflation time:  10 sec. A drug-eluting stent was successfully placed using a STENT ONYX FRONTIER 3.0X34. Maximum pressure: 16 atm. Inflation time: 10 sec. Minimum lumen area:  3.1 mm. Stent strut is well apposed. Stent overlaps previously placed stent. Post-stent angioplasty was performed using a BALLN  EUPHORA RX 3.0X20. Maximum pressure:  16 atm. Inflation time:  10 sec. Post-Intervention Lesion Assessment The intervention was successful. Intentional subintimal strategy was not used. Embolic protection device was not deployed. The guidewire was not threaded through a graft to reach the lesion. Pre-interventional TIMI flow is 0. Post-intervention TIMI flow is 3. Treated lesion length:  30 mm. No complications occurred at this lesion. Pressure gradient/FFR was not measured. Marland KitchenUltrasound (IVUS) was not performed  on the lesion. No optical coherence tomography (OCT) was performed. There is a 0% residual stenosis post intervention.   CARDIAC CATHETERIZATION  CARDIAC CATHETERIZATION 04/12/2020  Narrative  Prox LAD lesion is 85% stenosed.  Ost 1st Diag to 1st Diag lesion is 85% stenosed.  Dist LAD lesion is 65% stenosed.  Prox LAD to Mid LAD lesion is 75% stenosed.  Ost Cx to Mid Cx lesion is 80% stenosed.  Ost 1st Mrg to 1st Mrg lesion is 95% stenosed.  Mid Cx to Dist Cx lesion is 65% stenosed.  2nd Mrg lesion is 75% stenosed.  RPDA lesion is 60% stenosed.  1st RPL lesion is 70% stenosed.  Prox RCA to Mid RCA lesion is 30% stenosed.  Previously placed Dist RCA drug eluting stent is widely patent.  Balloon angioplasty was performed.  LV end diastolic pressure is normal.  1. Severe 2 vessel obstructive CAD 2. Patent stent in the RCA 3. Normal LVEDP  Plan: the severe disease in the left coronary distribution is unchanged from prior. No suitable targets for PCI. Recommend continued medical therapy. If refractory angina would need to consider CABG.  Findings Coronary  Findings Diagnostic  Dominance: Right  Left Anterior Descending Prox LAD lesion is 85% stenosed. The lesion is segmental. Prox LAD to Mid LAD lesion is 75% stenosed. The lesion is type C and eccentric. The lesion is calcified. Dist LAD lesion is 65% stenosed. The lesion is segmental.  First Diagonal Branch Vessel is small in size. Ost 1st Diag to 1st Diag lesion is 85% stenosed. The lesion is segmental.  Left Circumflex Ost Cx to Mid Cx lesion is 80% stenosed. The lesion is type C. The lesion is calcified. Mid Cx to Dist Cx lesion is 65% stenosed.  First Obtuse Marginal Branch Vessel is small in size. Ost 1st Mrg to 1st Mrg lesion is 95% stenosed. The lesion is type C.  Second Obtuse Marginal Branch 2nd Mrg lesion is 75% stenosed. The lesion is type C.  Right Coronary Artery Prox RCA to Mid RCA lesion is 30% stenosed. The lesion is type C and eccentric. Previously placed Dist RCA drug eluting stent is widely patent. The lesion is thrombotic and ulcerative.  Right Posterior Descending Artery RPDA lesion is 60% stenosed.  Right Posterior Atrioventricular Artery Vessel is small in size.  First Right Posterolateral Branch 1st RPL lesion is 70% stenosed.  Intervention  No interventions have been documented.   STRESS TESTS  MYOCARDIAL PERFUSION IMAGING 02/08/2015  Narrative  Horizontal ST segment depression ST segment depression was noted during stress in the II, III, aVF, V4, V5 and V6 leads, beginning at 4 minutes of stress, and returning to baseline after 1-5 minutes of recovery.  Defect 1: There is a medium defect of mild severity present in the basal anteroseptal, basal inferior, mid anteroseptal, mid inferior and apical inferior location.  High risk study with + GXT and ischemia in LAD +/- RCA territories. Pt will follow up with Dr. Jens Som   ECHOCARDIOGRAM  ECHOCARDIOGRAM COMPLETE 08/18/2022  Narrative ECHOCARDIOGRAM REPORT    Patient Name:   BERNARDO BRAYMAN  Date of Exam: 08/18/2022 Medical Rec #:  [id number]     Height:       71.0 in Accession #:    [id number]    Weight:       235.2 lb Date of Birth:  28-Oct-1933     BSA:          2.259 m Patient Age:    89  years      BP:           110/58 mmHg Patient Gender: M             HR:           58 bpm. Exam Location:  [facility]  Procedure: 2D Echo, Color Doppler and Cardiac Doppler  Indications:     Acute Myocardial Infarction I21.9  History:         Patient has prior history of Echocardiogram examinations. CAD and Previous Myocardial Infarction, Arrythmias:PAFlutter; Risk Factors:Diabetes and Dyslipidemia.  Sonographer:     L. Thornton-Maynard Referring Phys:  [id number] Gerda Diss D CALLWOOD Diagnosing Phys: Debbe Odea MD  IMPRESSIONS   1. Left ventricular ejection fraction, by estimation, is 60 to 65%. The left ventricle has normal function. The left ventricle has no regional wall motion abnormalities. There is mild left ventricular hypertrophy. Left ventricular diastolic parameters were normal. 2. Right ventricular systolic function is normal. The right ventricular size is normal. There is normal pulmonary artery systolic pressure. 3. The mitral valve is normal in structure. Trivial mitral valve regurgitation. 4. The aortic valve is tricuspid. Aortic valve regurgitation is not visualized. 5. Aortic dilatation noted. There is mild dilatation of the ascending aorta, measuring 39 mm. 6. The inferior vena cava is normal in size with greater than 50% respiratory variability, suggesting right atrial pressure of 3 mmHg.  FINDINGS Left Ventricle: Left ventricular ejection fraction, by estimation, is 60 to 65%. The left ventricle has normal function. The left ventricle has no regional wall motion abnormalities. The left ventricular internal cavity size was normal in size. There is mild left ventricular hypertrophy. Left ventricular diastolic parameters were normal.  Right Ventricle: The right ventricular  size is normal. No increase in right ventricular wall thickness. Right ventricular systolic function is normal. There is normal pulmonary artery systolic pressure. The tricuspid regurgitant velocity is 2.58 m/s, and with an assumed right atrial pressure of 3 mmHg, the estimated right ventricular systolic pressure is 29.6 mmHg.  Left Atrium: Left atrial size was normal in size.  Right Atrium: Right atrial size was normal in size.  Pericardium: There is no evidence of pericardial effusion.  Mitral Valve: The mitral valve is normal in structure. Trivial mitral valve regurgitation. MV peak gradient, 4.5 mmHg. The mean mitral valve gradient is 3.0 mmHg.  Tricuspid Valve: The tricuspid valve is normal in structure. Tricuspid valve regurgitation is not demonstrated.  Aortic Valve: The aortic valve is tricuspid. Aortic valve regurgitation is not visualized. Aortic valve mean gradient measures 6.0 mmHg. Aortic valve peak gradient measures 9.7 mmHg. Aortic valve area, by VTI measures 2.92 cm.  Pulmonic Valve: The pulmonic valve was not well visualized. Pulmonic valve regurgitation is not visualized.  Aorta: The aortic root is normal in size and structure and aortic dilatation noted. There is mild dilatation of the ascending aorta, measuring 39 mm.  Venous: The inferior vena cava is normal in size with greater than 50% respiratory variability, suggesting right atrial pressure of 3 mmHg.  IAS/Shunts: No atrial level shunt detected by color flow Doppler.   LEFT VENTRICLE PLAX 2D LVIDd:         4.60 cm     Diastology LVIDs:         3.20 cm     LV e' medial:    7.45 cm/s LV PW:         1.00 cm     LV E/e' medial:  13.0 LV IVS:  1.30 cm     LV e' lateral:   9.29 cm/s LVOT diam:     2.00 cm     LV E/e' lateral: 10.4 LV SV:         90 LV SV Index:   40 LVOT Area:     3.14 cm  LV Volumes (MOD) LV vol d, MOD A2C: 57.2 ml LV vol d, MOD A4C: 83.4 ml LV vol s, MOD A2C: 23.3 ml LV vol s, MOD  A4C: 31.3 ml LV SV MOD A2C:     33.9 ml LV SV MOD A4C:     83.4 ml LV SV MOD BP:      44.7 ml  RIGHT VENTRICLE RV S prime:     15.50 cm/s TAPSE (M-mode): 2.6 cm  LEFT ATRIUM             Index        RIGHT ATRIUM           Index LA diam:        4.80 cm 2.12 cm/m   RA Area:     18.40 cm LA Vol (A2C):   61.8 ml 27.36 ml/m  RA Volume:   42.80 ml  18.95 ml/m LA Vol (A4C):   64.7 ml 28.64 ml/m LA Biplane Vol: 63.5 ml 28.11 ml/m AORTIC VALVE                     PULMONIC VALVE AV Area (Vmax):    2.63 cm      PV Vmax:       1.07 m/s AV Area (Vmean):   2.34 cm      PV Peak grad:  4.6 mmHg AV Area (VTI):     2.92 cm AV Vmax:           155.50 cm/s AV Vmean:          110.500 cm/s AV VTI:            0.310 m AV Peak Grad:      9.7 mmHg AV Mean Grad:      6.0 mmHg LVOT Vmax:         130.00 cm/s LVOT Vmean:        82.300 cm/s LVOT VTI:          0.288 m LVOT/AV VTI ratio: 0.93  AORTA Ao Root diam: 3.50 cm Ao Asc diam:  3.90 cm  MITRAL VALVE               TRICUSPID VALVE MV Area (PHT): 2.82 cm    TR Peak grad:   26.6 mmHg MV Area VTI:   2.17 cm    TR Vmax:        258.00 cm/s MV Peak grad:  4.5 mmHg MV Mean grad:  3.0 mmHg    SHUNTS MV Vmax:       1.06 m/s    Systemic VTI:  0.29 m MV Vmean:      81.9 cm/s   Systemic Diam: 2.00 cm MV Decel Time: 269 msec MV E velocity: 96.90 cm/s MV A velocity: 90.70 cm/s MV E/A ratio:  1.07  Debbe Odea MD Electronically signed by Debbe Odea MD Signature Date/Time: 08/18/2022/1:13:24 PM    Final   TEE  ECHO TEE 10/14/2020  Narrative TRANSESOPHOGEAL ECHO REPORT    Patient Name:   KEMON DEVINCENZI Date of Exam: 10/14/2020 Medical Rec #:  [id number]     Height:  71.5 in Accession #:    [id number]    Weight:       243.9 lb Date of Birth:  02-06-1934     BSA:          2.306 m Patient Age:    86 years      BP:           93/56 mmHg Patient Gender: M             HR:           125 bpm. Exam Location:   Inpatient  Procedure: 3D Echo, Transesophageal Echo, Cardiac Doppler, Color Doppler and Saline Contrast Bubble Study  Indications:    I48.4 Atypical atrial flutter  History:        Patient has prior history of Echocardiogram examinations. CAD; Risk Factors:Diabetes, Hypertension and Sleep Apnea.  Sonographer:    Leta Jungling RDCS Referring Phys: [id number] Cyndi Bender  PROCEDURE: After discussion of the risks and benefits of a TEE, an informed consent was obtained from the patient. TEE procedure time was 18 minutes. The transesophogeal probe was passed without difficulty through the esophogus of the patient. Imaged were obtained with the patient in a left lateral decubitus position. Local oropharyngeal anesthetic was provided with Benzocaine spray. Sedation performed by different physician. The patient was monitored while under deep sedation. Anesthestetic sedation was provided intravenously by Anesthesiology: 263.32mg  of Propofol. Image quality was good. The patient's vital signs; including heart rate, blood pressure, and oxygen saturation; remained stable throughout the procedure. The patient developed no complications during the procedure. A successful direct current cardioversion was performed at 120 joules with 1 attempt.  IMPRESSIONS   1. Left ventricular ejection fraction, by estimation, is 50 to 55%. The left ventricle has low normal function. 2. Right ventricular systolic function is mildly reduced. The right ventricular size is normal. 3. No left atrial/left atrial appendage thrombus was detected. The LAA emptying velocity was 68 cm/s. 4. Right atrial size was mildly dilated. 5. A small pericardial effusion is present. 6. The mitral valve is degenerative. Mild mitral valve regurgitation. 7. Tricuspid valve regurgitation is mild to moderate. 8. The aortic valve is normal in structure. Aortic valve regurgitation is not visualized. No aortic stenosis is present. 9. There is mild  (Grade II) atheroma plaque involving the transverse and descending aorta. 10. One late bubble from agitated saline exam seen, suggestive of tiny intrapulmonary shunt.  Conclusion(s)/Recommendation(s): No LA/LAA thrombus identified. Successful cardioversion performed with restoration of normal sinus rhythm.  FINDINGS Left Ventricle: Left ventricular ejection fraction, by estimation, is 50 to 55%. The left ventricle has low normal function. The left ventricular internal cavity size was normal in size.  Right Ventricle: The right ventricular size is normal. No increase in right ventricular wall thickness. Right ventricular systolic function is mildly reduced.  Left Atrium: Left atrial size was normal in size. No left atrial/left atrial appendage thrombus was detected. The LAA emptying velocity was 68 cm/s.  Right Atrium: Right atrial size was mildly dilated.  Pericardium: A small pericardial effusion is present.  Mitral Valve: The mitral valve is degenerative in appearance. Mild mitral valve regurgitation.  Tricuspid Valve: The tricuspid valve is normal in structure. Tricuspid valve regurgitation is mild to moderate.  Aortic Valve: The aortic valve is normal in structure. Aortic valve regurgitation is not visualized. No aortic stenosis is present. Aortic valve mean gradient measures 2.0 mmHg. Aortic valve peak gradient measures 3.7 mmHg. Aortic valve area, by VTI measures 2.83 cm.  Pulmonic Valve: The pulmonic valve was grossly normal. Pulmonic valve regurgitation is trivial.  Aorta: The aortic root and ascending aorta are structurally normal, with no evidence of dilitation. There is mild (Grade II) atheroma plaque involving the transverse and descending aorta.  IAS/Shunts: No atrial level shunt detected by color flow Doppler. Agitated saline contrast was given intravenously to evaluate for intracardiac shunting. One late bubble from agitated saline exam seen, suggestive of tiny  intrapulmonary shunt.   LEFT VENTRICLE PLAX 2D LVOT diam:     2.10 cm LV SV:         42 LV SV Index:   18 LVOT Area:     3.46 cm   AORTIC VALVE AV Area (Vmax):    2.73 cm AV Area (Vmean):   2.45 cm AV Area (VTI):     2.83 cm AV Vmax:           96.70 cm/s AV Vmean:          73.100 cm/s AV VTI:            0.148 m AV Peak Grad:      3.7 mmHg AV Mean Grad:      2.0 mmHg LVOT Vmax:         76.30 cm/s LVOT Vmean:        51.700 cm/s LVOT VTI:          0.121 m LVOT/AV VTI ratio: 0.82  AORTA Ao Root diam: 3.40 cm Ao STJ diam:  2.1 cm Ao Asc diam:  3.20 cm   SHUNTS Systemic VTI:  0.12 m Systemic Diam: 2.10 cm  Weston Brass MD Electronically signed by Weston Brass MD Signature Date/Time: 10/15/2020/7:54:28 AM    Final           Recent Labs: 08/08/2022: B Natriuretic Peptide 292.0 08/11/2022: Magnesium 2.2 08/17/2022: TSH 7.730 12/21/2022: ALT 22; BUN 22; Creatinine, Ser 1.10; Potassium 3.8; Sodium 139 12/25/2022: Hemoglobin 13.2; Platelet Count 176  Recent Lipid Panel    Component Value Date/Time   CHOL 86 07/31/2022 0447   CHOL 126 09/15/2019 0810   TRIG 95 07/31/2022 0447   HDL 29 (L) 07/31/2022 0447   HDL 33 (L) 09/15/2019 0810   CHOLHDL 3.0 07/31/2022 0447   VLDL 19 07/31/2022 0447   LDLCALC 38 07/31/2022 0447   LDLCALC 59 09/15/2019 0810   LDLDIRECT 78.5 11/17/2012 0936    History of Present Illness    89 year old male with the above past medical history including CAD s/p DESx2-RCA, paroxysmal atrial fibrillation on Eliquis, hypertension, hyperlipidemia, type 2 diabetes, OSA and colon cancer.  He has a history of significant three-vessel coronary artery disease.  Patient has repeatedly declined CABG.  He was hospitalized in December 2018 in the setting of NSTEMI.  Repeat cardiac catheterization revealed severe three-vessel CAD.  Left-sided disease was unchanged when compared to prior.  The patient was found to have a new distal RCA lesion, s/p DES.   Cardiac catheterization in 2021 showed multivessel disease, patent stent to RCA, medical therapy was recommended.  Repeat cardiac catheterization February 2024 in the setting of NSTEMI revealed severe three-vessel CAD.  He underwent PCI/DES-RCA, proximal to prior distal RCA stent.  Echocardiogram at the time showed normal LV function, mild LVH, dilated ascending aorta measuring 39 mm.  Additionally, he has a history of paroxysmal atrial fibrillation/atrial flutter s/p prior DCCV in April 2022, on Eliquis.  He was last seen in the office on 11/30/2022 and was stable from a cardiac standpoint.  He noted some dyspnea with significant exertion, denied chest pain.  He was hospitalized in June 2024 in the setting of nausea, vomiting, nonspecific chest pain.  Cardiology was consulted.  Troponin was negative x 2.  Imdur was increased to 90 mg daily.  No further workup was recommended.  He presents today for follow-up accompanied by his wife.  Since his last visit and since his hospitalization he has been stable from a cardiac standpoint.  He denies any symptoms concerning for angina.  He does have stable mild dyspnea with exertion, unchanged from prior visits.  EKG today shows atrial fibrillation, he is unaware of paroxysms of atrial fibrillation.  He has stable dependent, mild, nonpitting bilateral lower extremity edema.  He notes occasional lightheadedness upon standing, denies any significant dizziness, presyncope, syncope, palpitations, PND, orthopnea, weight gain.  Overall, he reports feeling well.   Home Medications    Current Outpatient Medications  Medication Sig Dispense Refill   acetaminophen (TYLENOL) 325 MG tablet Take 325-650 mg by mouth every 6 (six) hours as needed for mild pain or headache.     Alcohol Swabs (B-D SINGLE USE SWABS REGULAR) PADS Use to check blood sugar 2 times a day 200 each 3   apixaban (ELIQUIS) 5 MG TABS tablet TAKE 1 TABLET TWICE DAILY 180 tablet 1   Blood Glucose Calibration  (TRUE METRIX LEVEL 1) Low SOLN Use to check control on glucose meter 1 each 3   Blood Glucose Monitoring Suppl (TRUE METRIX AIR GLUCOSE METER) w/Device KIT 1 kit by Other route 2 (two) times daily. Check blood sugar twice daily and as directed. Dx E11.65 1 kit 0   cetirizine (ZYRTEC) 10 MG tablet Take 10 mg by mouth daily.     clopidogrel (PLAVIX) 75 MG tablet Take 1 tablet (75 mg total) by mouth daily. 90 tablet 3   Coenzyme Q10 (CO Q 10 PO) Take 200 mg by mouth daily.     Cyanocobalamin (VITAMIN B12) 1000 MCG TBCR Take 1,000 mcg by mouth daily. 30 tablet    DROPLET PEN NEEDLES 31G X 6 MM MISC USE ONE TIME DAILY  AT  BEDTIME  WITH  LANTUS 100 each 3   fluticasone (FLONASE) 50 MCG/ACT nasal spray Place 1 spray into both nostrils daily as needed for allergies.      furosemide (LASIX) 20 MG tablet TAKE 1 TABLET EVERY DAY 90 tablet 3   insulin glargine (LANTUS SOLOSTAR) 100 UNIT/ML Solostar Pen Inject 50 Units into the skin at bedtime. (Patient taking differently: Inject 38 Units into the skin at bedtime.) 45 mL 3   isosorbide mononitrate (IMDUR) 30 MG 24 hr tablet Take 3 tablets (90 mg total) by mouth daily. 30 tablet 0   metFORMIN (GLUCOPHAGE) 1000 MG tablet TAKE 1 TABLET TWICE DAILY WITH MEALS 180 tablet 2   metoprolol tartrate (LOPRESSOR) 25 MG tablet TAKE 1/2 TABLET TWICE DAILY 90 tablet 3   rosuvastatin (CRESTOR) 40 MG tablet TAKE 1 TABLET EVERY DAY 90 tablet 3   TRUE METRIX BLOOD GLUCOSE TEST test strip CHECK BLOOD SUGAR TWICE DAILY 200 strip 3   TRUEplus Lancets 30G MISC Check blood sugar twice daily and as directed. Dx E11.65 200 each 3   nitroGLYCERIN (NITROSTAT) 0.4 MG SL tablet Place 1 tablet (0.4 mg total) under the tongue every 5 (five) minutes as needed for chest pain. (Patient not taking: Reported on 01/14/2023) 25 tablet 3   No current facility-administered medications for this visit.     Review of Systems  He denies chest pain, palpitations, pnd, orthopnea, n, v, dizziness,  syncope, weight gain, or early satiety. All other systems reviewed and are otherwise negative except as noted above.   Physical Exam    VS:  BP 126/74   Pulse (!) 59   Ht 5\' 11"  (1.803 m)   Wt 245 lb 12.8 oz (111.5 kg)   SpO2 96%   BMI 34.28 kg/m   GEN: Well nourished, well developed, in no acute distress. HEENT: normal. Neck: Supple, no JVD, carotid bruits, or masses. Cardiac: RRR, no murmurs, rubs, or gallops. No clubbing, cyanosis, edema.  Radials/DP/PT 2+ and equal bilaterally.  Respiratory:  Respirations regular and unlabored, clear to auscultation bilaterally. GI: Soft, nontender, nondistended, BS + x 4. MS: no deformity or atrophy. Skin: warm and dry, no rash. Neuro:  Strength and sensation are intact. Psych: Normal affect.  Accessory Clinical Findings    ECG personally reviewed by me today - EKG Interpretation Date/Time:  Monday January 14 2023 08:29:27 EDT Ventricular Rate:  59 PR Interval:    QRS Duration:  90 QT Interval:  424 QTC Calculation: 419 R Axis:   59  Text Interpretation: Atrial fibrillation with slow ventricular response Premature ventricular complexes Inferior infarct , age undetermined When compared with ECG of 21-Dec-2022 04:58, PREVIOUS ECG IS PRESENT Confirmed by Bernadene Person ([id number]) on 01/14/2023 8:42:45 AM  - no acute changes.   Lab Results  Component Value Date   WBC 6.5 12/25/2022   HGB 13.2 12/25/2022   HCT 40.5 12/25/2022   MCV 90.2 12/25/2022   PLT 176 12/25/2022   Lab Results  Component Value Date   CREATININE 1.10 12/21/2022   BUN 22 12/21/2022   NA 139 12/21/2022   K 3.8 12/21/2022   CL 108 12/21/2022   CO2 24 12/21/2022   Lab Results  Component Value Date   ALT 22 12/21/2022   AST 20 12/21/2022   ALKPHOS 80 12/21/2022   BILITOT 1.1 12/21/2022   Lab Results  Component Value Date   CHOL 86 07/31/2022   HDL 29 (L) 07/31/2022   LDLCALC 38 07/31/2022   LDLDIRECT 78.5 11/17/2012   TRIG 95 07/31/2022   CHOLHDL 3.0 07/31/2022     Lab Results  Component Value Date   HGBA1C 7.8 (H) 07/31/2022    Assessment & Plan   1. CAD: S/p DES x2-RCA (2018 and 2024).  Recent hospitalization with nonspecific chest pain.  Troponin was negative.  Imdur was increased.  Stable with no anginal symptoms. No indication for ischemic evaluation.  Continue Plavix (no ASA in the setting of chronic DOAC therapy), metoprolol, Lasix, Imdur, and Crestor.  2. Paroxysmal atrial fibrillation: EKG today shows atrial fibrillation, slow ventricular response.  He is asymptomatic.  We discussed possible repeat cardioversion, however, patient declines at this time.  Reviewed ED precautions.  Continue metoprolol, Eliquis.  3. Hypertension: BP well controlled. Continue current antihypertensive regimen.   4. Hyperlipidemia: LDL was 43 in 08/2022. Continue Crestor.   5. Type 2 diabetes: A1c was 7.9 in 12/2022. Monitored and managed per PCP.   6. OSA: Adherent to CPAP.   7. Colon cancer: S/p resection in 08/2022. Following with oncology.    8. Disposition: Follow-up in 4 to 6 months with Dr. Jens Som.     Joylene Grapes, NP 01/14/2023, 9:09 AM

## 2023-01-14 NOTE — Patient Instructions (Signed)
Medication Instructions:  Your physician recommends that you continue on your current medications as directed. Please refer to the Current Medication list given to you today.   *If you need a refill on your cardiac medications before your next appointment, please call your pharmacy*   Lab Work: Your physician recommends that you continue on your current medications as directed. Please refer to the Current Medication list given to you today.    Testing/Procedures: NONE ordered at this time of appointment     Follow-Up: At [facility], you and your health needs are our priority.  As part of our continuing mission to provide you with exceptional heart care, we have created designated Provider Care Teams.  These Care Teams include your primary Cardiologist (physician) and Advanced Practice Providers (APPs -  Physician Assistants and Nurse Practitioners) who all work together to provide you with the care you need, when you need it.  We recommend signing up for the patient portal called "MyChart".  Sign up information is provided on this After Visit Summary.  MyChart is used to connect with patients for Virtual Visits (Telemedicine).  Patients are able to view lab/test results, encounter notes, upcoming appointments, etc.  Non-urgent messages can be sent to your provider as well.   To learn more about what you can do with MyChart, go to [email or web address].    Your next appointment:   4-6 month(s)  Provider:   Olga Millers, MD

## 2023-01-21 ENCOUNTER — Other Ambulatory Visit: Payer: Self-pay

## 2023-01-21 MED ORDER — METFORMIN HCL 1000 MG PO TABS: 1000.0000 mg | ORAL_TABLET | Freq: Two times a day (BID) | ORAL | 2 refills | Status: DC

## 2023-01-28 DIAGNOSIS — L570 Actinic keratosis: Secondary | ICD-10-CM | POA: Diagnosis not present

## 2023-02-25 DIAGNOSIS — E113293 Type 2 diabetes mellitus with mild nonproliferative diabetic retinopathy without macular edema, bilateral: Secondary | ICD-10-CM | POA: Diagnosis not present

## 2023-02-25 DIAGNOSIS — H35372 Puckering of macula, left eye: Secondary | ICD-10-CM | POA: Diagnosis not present

## 2023-02-25 DIAGNOSIS — H353132 Nonexudative age-related macular degeneration, bilateral, intermediate dry stage: Secondary | ICD-10-CM | POA: Diagnosis not present

## 2023-02-25 DIAGNOSIS — Z961 Presence of intraocular lens: Secondary | ICD-10-CM | POA: Diagnosis not present

## 2023-03-08 ENCOUNTER — Other Ambulatory Visit: Payer: Self-pay | Admitting: Family Medicine

## 2023-03-14 ENCOUNTER — Other Ambulatory Visit: Payer: Self-pay | Admitting: Family Medicine

## 2023-03-14 DIAGNOSIS — E1165 Type 2 diabetes mellitus with hyperglycemia: Secondary | ICD-10-CM

## 2023-03-22 ENCOUNTER — Emergency Department: Payer: Medicare HMO

## 2023-03-22 ENCOUNTER — Ambulatory Visit
Admission: RE | Admit: 2023-03-22 | Discharge: 2023-03-22 | Disposition: A | Discharge status: Home / Self Care | Payer: Medicare HMO | Source: Ambulatory Visit | Attending: Family Medicine | Admitting: Family Medicine

## 2023-03-22 ENCOUNTER — Ambulatory Visit (INDEPENDENT_AMBULATORY_CARE_PROVIDER_SITE_OTHER)
Admission: RE | Admit: 2023-03-22 | Discharge: 2023-03-22 | Disposition: A | Discharge status: Home / Self Care | Payer: Medicare HMO | Source: Ambulatory Visit | Attending: Family Medicine | Admitting: Family Medicine

## 2023-03-22 ENCOUNTER — Other Ambulatory Visit: Payer: Self-pay

## 2023-03-22 ENCOUNTER — Encounter: Payer: Self-pay | Admitting: Emergency Medicine

## 2023-03-22 ENCOUNTER — Observation Stay
Admission: EM | Admit: 2023-03-22 | Discharge: 2023-03-23 | Disposition: A | Discharge status: Home / Self Care | Payer: Medicare HMO | Attending: Emergency Medicine | Admitting: Emergency Medicine

## 2023-03-22 ENCOUNTER — Encounter: Payer: Self-pay | Admitting: Family Medicine

## 2023-03-22 ENCOUNTER — Ambulatory Visit (INDEPENDENT_AMBULATORY_CARE_PROVIDER_SITE_OTHER): Payer: Medicare HMO | Admitting: Family Medicine

## 2023-03-22 VITALS — BP 110/60 | HR 74 | Temp 100.5°F | Ht 71.0 in | Wt 249.1 lb

## 2023-03-22 DIAGNOSIS — G4733 Obstructive sleep apnea (adult) (pediatric): Secondary | ICD-10-CM | POA: Diagnosis not present

## 2023-03-22 DIAGNOSIS — N1831 Chronic kidney disease, stage 3a: Secondary | ICD-10-CM | POA: Insufficient documentation

## 2023-03-22 DIAGNOSIS — Z7902 Long term (current) use of antithrombotics/antiplatelets: Secondary | ICD-10-CM | POA: Diagnosis not present

## 2023-03-22 DIAGNOSIS — Z7984 Long term (current) use of oral hypoglycemic drugs: Secondary | ICD-10-CM | POA: Insufficient documentation

## 2023-03-22 DIAGNOSIS — R0789 Other chest pain: Secondary | ICD-10-CM | POA: Diagnosis not present

## 2023-03-22 DIAGNOSIS — Z1152 Encounter for screening for COVID-19: Secondary | ICD-10-CM | POA: Diagnosis not present

## 2023-03-22 DIAGNOSIS — Z955 Presence of coronary angioplasty implant and graft: Secondary | ICD-10-CM | POA: Diagnosis not present

## 2023-03-22 DIAGNOSIS — N2889 Other specified disorders of kidney and ureter: Secondary | ICD-10-CM | POA: Diagnosis not present

## 2023-03-22 DIAGNOSIS — Z85828 Personal history of other malignant neoplasm of skin: Secondary | ICD-10-CM | POA: Diagnosis not present

## 2023-03-22 DIAGNOSIS — R918 Other nonspecific abnormal finding of lung field: Secondary | ICD-10-CM | POA: Diagnosis not present

## 2023-03-22 DIAGNOSIS — I483 Typical atrial flutter: Secondary | ICD-10-CM | POA: Insufficient documentation

## 2023-03-22 DIAGNOSIS — Z7901 Long term (current) use of anticoagulants: Secondary | ICD-10-CM | POA: Diagnosis not present

## 2023-03-22 DIAGNOSIS — I480 Paroxysmal atrial fibrillation: Secondary | ICD-10-CM | POA: Insufficient documentation

## 2023-03-22 DIAGNOSIS — E1122 Type 2 diabetes mellitus with diabetic chronic kidney disease: Secondary | ICD-10-CM | POA: Insufficient documentation

## 2023-03-22 DIAGNOSIS — N281 Cyst of kidney, acquired: Secondary | ICD-10-CM | POA: Diagnosis not present

## 2023-03-22 DIAGNOSIS — E785 Hyperlipidemia, unspecified: Secondary | ICD-10-CM | POA: Diagnosis present

## 2023-03-22 DIAGNOSIS — I4892 Unspecified atrial flutter: Secondary | ICD-10-CM | POA: Diagnosis present

## 2023-03-22 DIAGNOSIS — W19XXXA Unspecified fall, initial encounter: Secondary | ICD-10-CM | POA: Diagnosis not present

## 2023-03-22 DIAGNOSIS — R509 Fever, unspecified: Secondary | ICD-10-CM | POA: Insufficient documentation

## 2023-03-22 DIAGNOSIS — Z85038 Personal history of other malignant neoplasm of large intestine: Secondary | ICD-10-CM | POA: Diagnosis not present

## 2023-03-22 DIAGNOSIS — S3991XA Unspecified injury of abdomen, initial encounter: Secondary | ICD-10-CM | POA: Insufficient documentation

## 2023-03-22 DIAGNOSIS — Z794 Long term (current) use of insulin: Secondary | ICD-10-CM | POA: Insufficient documentation

## 2023-03-22 DIAGNOSIS — D62 Acute posthemorrhagic anemia: Secondary | ICD-10-CM | POA: Insufficient documentation

## 2023-03-22 DIAGNOSIS — R109 Unspecified abdominal pain: Secondary | ICD-10-CM

## 2023-03-22 DIAGNOSIS — I129 Hypertensive chronic kidney disease with stage 1 through stage 4 chronic kidney disease, or unspecified chronic kidney disease: Secondary | ICD-10-CM | POA: Diagnosis not present

## 2023-03-22 DIAGNOSIS — S301XXA Contusion of abdominal wall, initial encounter: Secondary | ICD-10-CM

## 2023-03-22 DIAGNOSIS — I2510 Atherosclerotic heart disease of native coronary artery without angina pectoris: Secondary | ICD-10-CM | POA: Diagnosis not present

## 2023-03-22 DIAGNOSIS — I10 Essential (primary) hypertension: Secondary | ICD-10-CM | POA: Diagnosis not present

## 2023-03-22 DIAGNOSIS — Z9861 Coronary angioplasty status: Secondary | ICD-10-CM

## 2023-03-22 DIAGNOSIS — R0781 Pleurodynia: Secondary | ICD-10-CM | POA: Diagnosis not present

## 2023-03-22 DIAGNOSIS — S299XXA Unspecified injury of thorax, initial encounter: Secondary | ICD-10-CM | POA: Diagnosis not present

## 2023-03-22 DIAGNOSIS — I700 Atherosclerosis of aorta: Secondary | ICD-10-CM | POA: Diagnosis not present

## 2023-03-22 DIAGNOSIS — K8020 Calculus of gallbladder without cholecystitis without obstruction: Secondary | ICD-10-CM | POA: Diagnosis not present

## 2023-03-22 DIAGNOSIS — Z79899 Other long term (current) drug therapy: Secondary | ICD-10-CM | POA: Insufficient documentation

## 2023-03-22 DIAGNOSIS — E669 Obesity, unspecified: Secondary | ICD-10-CM | POA: Diagnosis present

## 2023-03-22 DIAGNOSIS — K449 Diaphragmatic hernia without obstruction or gangrene: Secondary | ICD-10-CM | POA: Diagnosis not present

## 2023-03-22 DIAGNOSIS — E1129 Type 2 diabetes mellitus with other diabetic kidney complication: Secondary | ICD-10-CM | POA: Diagnosis present

## 2023-03-22 DIAGNOSIS — S2232XA Fracture of one rib, left side, initial encounter for closed fracture: Secondary | ICD-10-CM | POA: Diagnosis not present

## 2023-03-22 LAB — CBC WITH DIFFERENTIAL/PLATELET
Abs Immature Granulocytes: 0.02 10*3/uL (ref 0.00–0.07)
Basophils Absolute: 0 10*3/uL (ref 0.0–0.1)
Basophils Relative: 0 %
Eosinophils Absolute: 0.1 10*3/uL (ref 0.0–0.5)
Eosinophils Relative: 2 %
HCT: 30.1 % — ABNORMAL LOW (ref 39.0–52.0)
Hemoglobin: 10.1 g/dL — ABNORMAL LOW (ref 13.0–17.0)
Immature Granulocytes: 0 %
Lymphocytes Relative: 16 %
Lymphs Abs: 1 10*3/uL (ref 0.7–4.0)
MCH: 31.5 pg (ref 26.0–34.0)
MCHC: 33.6 g/dL (ref 30.0–36.0)
MCV: 93.8 fL (ref 80.0–100.0)
Monocytes Absolute: 0.9 10*3/uL (ref 0.1–1.0)
Monocytes Relative: 14 %
Neutro Abs: 4.3 10*3/uL (ref 1.7–7.7)
Neutrophils Relative %: 68 %
Platelets: 179 10*3/uL (ref 150–400)
RBC: 3.21 MIL/uL — ABNORMAL LOW (ref 4.22–5.81)
RDW: 14.9 % (ref 11.5–15.5)
WBC: 6.4 10*3/uL (ref 4.0–10.5)
nRBC: 0 % (ref 0.0–0.2)

## 2023-03-22 LAB — URINALYSIS, ROUTINE W REFLEX MICROSCOPIC
Bacteria, UA: NONE SEEN
Bilirubin Urine: NEGATIVE
Glucose, UA: >=500 mg/dL — AB
Hgb urine dipstick: NEGATIVE
Ketones, ur: NEGATIVE mg/dL
Leukocytes,Ua: NEGATIVE
Nitrite: NEGATIVE
Protein, ur: 30 mg/dL — AB
Specific Gravity, Urine: 1.025 (ref 1.005–1.030)
Squamous Epithelial / HPF: 0 /HPF (ref 0–5)
pH: 6 (ref 5.0–8.0)

## 2023-03-22 LAB — COMPREHENSIVE METABOLIC PANEL
ALT: 18 U/L (ref 0–44)
AST: 17 U/L (ref 15–41)
Albumin: 3.1 g/dL — ABNORMAL LOW (ref 3.5–5.0)
Alkaline Phosphatase: 69 U/L (ref 38–126)
Anion gap: 9 (ref 5–15)
BUN: 18 mg/dL (ref 8–23)
CO2: 25 mmol/L (ref 22–32)
Calcium: 8.4 mg/dL — ABNORMAL LOW (ref 8.9–10.3)
Chloride: 103 mmol/L (ref 98–111)
Creatinine, Ser: 1.21 mg/dL (ref 0.61–1.24)
GFR, Estimated: 58 mL/min — ABNORMAL LOW (ref >60)
Glucose, Bld: 246 mg/dL — ABNORMAL HIGH (ref 70–99)
Potassium: 4.1 mmol/L (ref 3.5–5.1)
Sodium: 137 mmol/L (ref 135–145)
Total Bilirubin: 1.4 mg/dL — ABNORMAL HIGH (ref 0.3–1.2)
Total Protein: 6.1 g/dL — ABNORMAL LOW (ref 6.5–8.1)

## 2023-03-22 LAB — PROTIME-INR
INR: 1.4 — ABNORMAL HIGH (ref 0.8–1.2)
Prothrombin Time: 17.6 seconds — ABNORMAL HIGH (ref 11.4–15.2)

## 2023-03-22 LAB — LIPASE, BLOOD: Lipase: 24 U/L (ref 11–51)

## 2023-03-22 LAB — APTT: aPTT: 46 seconds — ABNORMAL HIGH (ref 24–36)

## 2023-03-22 LAB — GLUCOSE, CAPILLARY: Glucose-Capillary: 278 mg/dL — ABNORMAL HIGH (ref 70–99)

## 2023-03-22 LAB — LACTIC ACID, PLASMA: Lactic Acid, Venous: 1.3 mmol/L (ref 0.5–1.9)

## 2023-03-22 MED ORDER — VITAMIN B-12 1000 MCG PO TABS
1000.0000 ug | ORAL_TABLET | Freq: Every day | ORAL | Status: DC
  Administered 2023-03-23: 1000 ug via ORAL
  Filled 2023-03-22: qty 1

## 2023-03-22 MED ORDER — INSULIN ASPART 100 UNIT/ML IJ SOLN
0.0000 [IU] | Freq: Every day | INTRAMUSCULAR | Status: DC
  Administered 2023-03-23: 3 [IU] via SUBCUTANEOUS
  Filled 2023-03-22: qty 1

## 2023-03-22 MED ORDER — ONDANSETRON HCL 4 MG/2ML IJ SOLN: 4.0000 mg | Freq: Three times a day (TID) | INTRAMUSCULAR | Status: DC | PRN

## 2023-03-22 MED ORDER — NITROGLYCERIN 0.4 MG SL SUBL: 0.4000 mg | SUBLINGUAL_TABLET | SUBLINGUAL | Status: DC | PRN

## 2023-03-22 MED ORDER — OXYCODONE-ACETAMINOPHEN 5-325 MG PO TABS: 1.0000 | ORAL_TABLET | ORAL | Status: DC | PRN

## 2023-03-22 MED ORDER — INSULIN GLARGINE-YFGN 100 UNIT/ML ~~LOC~~ SOLN
30.0000 [IU] | Freq: Every day | SUBCUTANEOUS | Status: DC
  Administered 2023-03-23: 30 [IU] via SUBCUTANEOUS
  Filled 2023-03-22 (×2): qty 0.3

## 2023-03-22 MED ORDER — FLUTICASONE PROPIONATE 50 MCG/ACT NA SUSP: 1.0000 | Freq: Every day | NASAL | Status: DC | PRN

## 2023-03-22 MED ORDER — SENNOSIDES-DOCUSATE SODIUM 8.6-50 MG PO TABS: 1.0000 | ORAL_TABLET | Freq: Two times a day (BID) | ORAL | Status: DC | PRN

## 2023-03-22 MED ORDER — IOHEXOL 350 MG/ML SOLN
100.0000 mL | Freq: Once | INTRAVENOUS | Status: AC | PRN
  Administered 2023-03-22: 100 mL via INTRAVENOUS

## 2023-03-22 MED ORDER — ROSUVASTATIN CALCIUM 10 MG PO TABS
40.0000 mg | ORAL_TABLET | Freq: Every day | ORAL | Status: DC
  Administered 2023-03-23: 40 mg via ORAL
  Filled 2023-03-22: qty 4

## 2023-03-22 MED ORDER — INSULIN ASPART 100 UNIT/ML IJ SOLN: 0.0000 [IU] | Freq: Three times a day (TID) | INTRAMUSCULAR | Status: DC

## 2023-03-22 MED ORDER — MORPHINE SULFATE (PF) 2 MG/ML IV SOLN: 0.5000 mg | INTRAVENOUS | Status: DC | PRN

## 2023-03-22 MED ORDER — HYDRALAZINE HCL 20 MG/ML IJ SOLN: 5.0000 mg | INTRAMUSCULAR | Status: DC | PRN

## 2023-03-22 MED ORDER — METOPROLOL TARTRATE 25 MG PO TABS
12.5000 mg | ORAL_TABLET | Freq: Two times a day (BID) | ORAL | Status: DC
  Administered 2023-03-23 (×2): 12.5 mg via ORAL
  Filled 2023-03-22 (×2): qty 1

## 2023-03-22 MED ORDER — ACETAMINOPHEN 325 MG PO TABS: 650.0000 mg | ORAL_TABLET | Freq: Four times a day (QID) | ORAL | Status: DC | PRN

## 2023-03-22 MED ORDER — ISOSORBIDE MONONITRATE ER 30 MG PO TB24
90.0000 mg | ORAL_TABLET | Freq: Every day | ORAL | Status: DC
  Administered 2023-03-23: 90 mg via ORAL
  Filled 2023-03-22: qty 3

## 2023-03-22 MED ORDER — FUROSEMIDE 20 MG PO TABS
20.0000 mg | ORAL_TABLET | Freq: Every day | ORAL | Status: DC
  Administered 2023-03-23: 20 mg via ORAL
  Filled 2023-03-22: qty 1
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
## 2023-03-22 NOTE — ED Provider Notes (Signed)
[facility] Provider Note    Event Date/Time   First MD Initiated Contact with Patient 03/22/23 1740     (approximate)   History   Abdominal Pain   HPI Ryan Garcia is a 89 y.o. male with A-fib on Eliquis, CHF, DM2, HLD presenting today for abdominal pain.  Patient reports 8 days ago he lost control of his [name] and started rolling away.  He got in front of it to attempt to stop it and it hit him on the left side of his body.  He has had increasing pain and bruising to the left side since then.  Was seen by his primary care provider today who got an emergent CT of the abdomen/pelvis which showed concern for bleeding around the kidney.  Sent to the ED for further evaluation.  Mostly notes left-sided flank and chest pain.  Denied any head injury or fall.  Denies injury to any of the extremities.     Physical Exam   Triage Vital Signs: ED Triage Vitals  Encounter Vitals Group     BP 03/22/23 1737 (!) 152/79     Systolic BP Percentile --      Diastolic BP Percentile --      Pulse Rate 03/22/23 1737 71     Resp 03/22/23 1737 18     Temp 03/22/23 1737 98.7 F (37.1 C)     Temp Source 03/22/23 1737 Oral     SpO2 03/22/23 1737 97 %     Weight 03/22/23 1738 245 lb (111.1 kg)     Height 03/22/23 1738 5\' 11"  (1.803 m)     Head Circumference --      Peak Flow --      Pain Score 03/22/23 1737 2     Pain Loc --      Pain Education --      Exclude from Growth Chart --     Most recent vital signs: Vitals:   03/22/23 2230 03/22/23 2306  BP: (!) 156/80 (!) 151/69  Pulse: 77 78  Resp: 17 18  Temp:  98.8 F (37.1 C)  SpO2: 95% 96%   Physical Exam: I have reviewed the vital signs and nursing notes. General: Awake, alert, no acute distress.  Nontoxic appearing. Head:  Atraumatic, normocephalic.   ENT:  EOM intact, PERRL. Oral mucosa is pink and moist with no lesions. Neck: Neck is supple with full range of motion, No meningeal  signs. Cardiovascular:  RRR, No murmurs. Peripheral pulses palpable and equal bilaterally. Respiratory:  Symmetrical chest wall expansion.  No rhonchi, rales, or wheezes.  Good air movement throughout.  No use of accessory muscles.   Musculoskeletal:  No cyanosis or edema. Moving extremities with full ROM Abdomen:  Soft, tenderness to palpation to left flank, nondistended.  Midline ventral hernia present. Neuro:  GCS 15, moving all four extremities, interacting appropriately. Speech clear. Psych:  Calm, appropriate.   Skin: Bruising noted to left flank which wraps around to his left lower back.    ED Results / Procedures / Treatments   Labs (all labs ordered are listed, but only abnormal results are displayed) Labs Reviewed  CBC WITH DIFFERENTIAL/PLATELET - Abnormal; Notable for the following components:      Result Value   RBC 3.21 (*)    Hemoglobin 10.1 (*)    HCT 30.1 (*)    All other components within normal limits  COMPREHENSIVE METABOLIC PANEL - Abnormal; Notable for the following components:  Glucose, Bld 246 (*)    Calcium 8.4 (*)    Total Protein 6.1 (*)    Albumin 3.1 (*)    Total Bilirubin 1.4 (*)    GFR, Estimated 58 (*)    All other components within normal limits  PROTIME-INR - Abnormal; Notable for the following components:   Prothrombin Time 17.6 (*)    INR 1.4 (*)    All other components within normal limits  APTT - Abnormal; Notable for the following components:   aPTT 46 (*)    All other components within normal limits  URINALYSIS, ROUTINE W REFLEX MICROSCOPIC - Abnormal; Notable for the following components:   Color, Urine STRAW (*)    APPearance CLEAR (*)    Glucose, UA >=500 (*)    Protein, ur 30 (*)    All other components within normal limits  CULTURE, BLOOD (ROUTINE X 2)  CULTURE, BLOOD (ROUTINE X 2)  SARS CORONAVIRUS 2 BY RT PCR  LIPASE, BLOOD  LACTIC ACID, PLASMA  CBC  CBC  CBC  PROCALCITONIN  BASIC METABOLIC PANEL  TYPE AND SCREEN      EKG My EKG interpretation: Rate of 74, normal sinus rhythm, normal axis.  Normal intervals.  No acute ST elevation or depression   RADIOLOGY Independently interpreted CT abdomen/pelvis showing hemorrhagic renal cyst but no evidence of damage to the actual kidney.   PROCEDURES:  Critical Care performed: No  Procedures   MEDICATIONS ORDERED IN ED: Medications  oxyCODONE-acetaminophen (PERCOCET/ROXICET) 5-325 MG per tablet 1 tablet (has no administration in time range)  morphine (PF) 2 MG/ML injection 0.5 mg (has no administration in time range)  acetaminophen (TYLENOL) tablet 650 mg (has no administration in time range)  ondansetron (ZOFRAN) injection 4 mg (has no administration in time range)  hydrALAZINE (APRESOLINE) injection 5 mg (has no administration in time range)  furosemide (LASIX) tablet 20 mg (has no administration in time range)  isosorbide mononitrate (IMDUR) 24 hr tablet 90 mg (has no administration in time range)  metoprolol tartrate (LOPRESSOR) tablet 12.5 mg (has no administration in time range)  nitroGLYCERIN (NITROSTAT) SL tablet 0.4 mg (has no administration in time range)  rosuvastatin (CRESTOR) tablet 40 mg (has no administration in time range)  insulin glargine-yfgn (SEMGLEE) injection 30 Units (has no administration in time range)  cyanocobalamin (VITAMIN B12) tablet 1,000 mcg (has no administration in time range)  fluticasone (FLONASE) 50 MCG/ACT nasal spray 1 spray (has no administration in time range)  senna-docusate (Senokot-S) tablet 1 tablet (has no administration in time range)  insulin aspart (novoLOG) injection 0-5 Units (has no administration in time range)  insulin aspart (novoLOG) injection 0-9 Units (has no administration in time range)  iohexol (OMNIPAQUE) 350 MG/ML injection 100 mL (100 mLs Intravenous Contrast Given 03/22/23 1839)     IMPRESSION / MDM / ASSESSMENT AND PLAN / ED COURSE  I reviewed the triage vital signs and the nursing  notes.                              Differential diagnosis includes, but is not limited to, traumatic renal cyst rupture, renal laceration, splenic laceration.  Patient's presentation is most consistent with acute presentation with potential threat to life or bodily function.  Patient is an 89 year old male presenting 8 days out from blunt traumatic injury to the left side of his abdomen.  Outpatient CT showing concern for hemorrhage around the left kidney with concern for  possible renal laceration.  Patient evaluated in the ED with follow-up CT imaging of the chest/abdomen/pelvis with contrast.  This shows evidence of hemorrhagic renal cyst rupture but no damage to the actual kidney.  No other traumatic injuries to the abdomen.  There is 1 nondisplaced left-sided rib fracture.  Discussed the case with both general surgery and urology who recommend follow-up with H&H trending in the next 24 hours.  No urgent surgical intervention at this time.  Patient otherwise with stable vital signs.  Hemoglobin is decreased by 3 from most recent labs 2 months ago but no other acute need for blood products at this time with stable vital signs.  Patient admitted to hospitalist service for further care.  The patient is on the cardiac monitor to evaluate for evidence of arrhythmia and/or significant heart rate changes. Clinical Course as of 03/22/23 2316  Fri Mar 22, 2023  1811 CBC with Differential(!) Hemoglobin slightly low at 10.1 today compared to most recent baseline 3 months ago at 56. [DW]  2124 Spoke with general surgery, Dr. Claudine Mouton.  He suspects the bleeding is probably stable at this point given how far out he has but did note the hemoglobin drop.  Does not think anything acutely needs to be done in terms of intervention.  States care discussed with family about staying for the next 24 hours for repeat H&H to make sure is not continuing to drop and holding Eliquis at least for 1 day. [DW]  2133 Spoke  with Dr. Arita Miss from urology.  Also recommends admission for trending H&H's and pain control.  Suspects likely no active bleed at this time. [DW]    Clinical Course User Index [DW] Janith Lima, MD     FINAL CLINICAL IMPRESSION(S) / ED DIAGNOSES   Final diagnoses:  Renal cyst, native, hemorrhage  Blunt trauma to abdomen, initial encounter     Rx / DC Orders   ED Discharge Orders     None        Note:  This document was prepared using Dragon voice recognition software and may include unintentional dictation errors.   Janith Lima, MD 03/22/23 [phone]

## 2023-03-22 NOTE — ED Notes (Signed)
Dr Clyde Lundborg at bedside

## 2023-03-22 NOTE — H&P (Addendum)
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 results were called by telephone on 03/22/2023 at 5:02 pm to provider AMY BEDSOLE by the radiology technologist at the [facility]. Electronically Signed   By: Danae Orleans M.D.   On: 03/22/2023 17:04   DG Chest 2 View  Result Date: 03/22/2023 CLINICAL DATA:  left lateral rib pain and fever, S/P blunt trauma to abdpmen and left ribcage. EXAM: CHEST - 2 VIEW COMPARISON:  Chest x-ray December 21, 2022. FINDINGS: Nodular opacity at the right lung base. Mild streaky left basilar opacities. Otherwise, clear lungs. No visible pleural effusions or pneumothorax. No evidence of displaced fracture. Cardiomediastinal silhouette is similar. IMPRESSION: 1. Nodular opacity at the right lung base may represent nipple shadow. Recommend repeat chest x-ray with nipple markers to confirm and exclude pulmonary nodule. 2. Mild streaky left basilar opacities, likely atelectasis. 3. No evidence of rib fracture or pneumothorax. Electronically Signed   By: Feliberto Harts M.D.   On: 03/22/2023 16:09      Assessment/Plan Principal Problem:   Renal cyst, native,  hemorrhage Active Problems:   Acute blood loss anemia   Fever   Primary hypertension   CAD S/P percutaneous coronary angioplasty   Paroxysmal atrial flutter (HCC)   HLD (hyperlipidemia)   Chronic kidney disease, stage 3a (HCC)   Type II diabetes mellitus with renal manifestations (HCC)   Obstructive sleep apnea on CPAP   Obesity (BMI 30-39.9)   Left rib fracture   Assessment and Plan:   Renal cyst, native, hemorrhage and acute blood loss anemia: Hgb dropped from 13.2 to 10.1. ED physician discussed  with Dr. Arita Miss of urology and Dr. Claudine Mouton of surgery, will not need surgery per them.  They recommended to trend hemoglobin levels.  -will place in tele bed as inpt -Pain control: As needed morphine, Tylenol, Percocet -Follow-up CBC every 6 hours, goal for transfusion is hemoglobin < 8.0 due to hx of CAD -Hold Plavix and Eliquis  Fever: Patient denies subjective fever and chills, but was found to have temperature 100.5 which became 98.7 on repeated measurement in ED. No source of infection identified.  Urinalysis negative.  No signs of pneumonia. -Hold antibiotics and get procalcitonin level -Blood culture -check [phone]  Primary hypertension -Lasix, metoprolol -IV hydralazine as needed  CAD S/P percutaneous coronary angioplasty: S/p of DES placement -Will temporarily hold Plavix and Eliquis -Continue Imdur, as needed nitroglycerin -Crestor  Paroxysmal atrial flutter (HCC): Heart rate 90s -Metoprolol -Hold Eliquis  HLD (hyperlipidemia) -Crestor  Chronic kidney disease, stage 3a (HCC): Renal function stable -Follow-up with BMP  Type II diabetes mellitus with renal manifestations (HCC): Recent A1c 7.8, poorly controlled.  Patient is taking metformin, NovoLog, Lantus 38 units daily per patient -Sliding scale insulin -Glargine insulin 30 units daily  Obstructive sleep apnea - on CPAP  Obesity (BMI 30-39.9): Body weight 111.1 kg, BMI 34.17 -Encourage losing  weight -Exercise healthy diet  Left rib fracture, 11th rib: -Incentive spirometry -Pain control as above     DVT ppx: SCD  Code Status: Full code   Family Communication: Yes, patient's wife at bed side and his son by phone   position Plan:  Anticipate discharge back to previous environment  Consults called: ED physician discussed with Dr. Arita Miss of urology, Dr. Claudine Mouton of surgery  Admission status and Level of care: Telemetry Medical:  for obs    Dispo: The patient is from: Home              Anticipated d/c is to: Home              Anticipated d/c date is: 1 day              Patient currently is not medically stable to d/c.    Severity of Illness:  The appropriate patient status for this patient is OBSERVATION. Observation status is judged to be reasonable and necessary in order to provide the required intensity of service to ensure the patient's safety. The patient's presenting symptoms, physical exam findings, and initial radiographic and laboratory data in the context of their medical condition is felt to place them at decreased risk for further clinical deterioration. Furthermore, it is anticipated that the patient will be medically stable for discharge from the hospital within 2 midnights of admission.        Date of Service 03/22/2023    Lorretta Harp [facility]   If 7PM-7AM, please contact night-coverage [email or web address] 03/22/2023, 11:07 PM

## 2023-03-23 DIAGNOSIS — N281 Cyst of kidney, acquired: Secondary | ICD-10-CM | POA: Diagnosis not present

## 2023-03-23 DIAGNOSIS — N2889 Other specified disorders of kidney and ureter: Secondary | ICD-10-CM | POA: Diagnosis not present

## 2023-03-23 LAB — CBC
HCT: 27.6 % — ABNORMAL LOW (ref 39.0–52.0)
HCT: 29.1 % — ABNORMAL LOW (ref 39.0–52.0)
HCT: 30.4 % — ABNORMAL LOW (ref 39.0–52.0)
Hemoglobin: 10.2 g/dL — ABNORMAL LOW (ref 13.0–17.0)
Hemoglobin: 9.4 g/dL — ABNORMAL LOW (ref 13.0–17.0)
Hemoglobin: 9.8 g/dL — ABNORMAL LOW (ref 13.0–17.0)
MCH: 30.9 pg (ref 26.0–34.0)
MCH: 31.2 pg (ref 26.0–34.0)
MCH: 31.5 pg (ref 26.0–34.0)
MCHC: 33.6 g/dL (ref 30.0–36.0)
MCHC: 33.7 g/dL (ref 30.0–36.0)
MCHC: 34.1 g/dL (ref 30.0–36.0)
MCV: 90.8 fL (ref 80.0–100.0)
MCV: 92.7 fL (ref 80.0–100.0)
MCV: 93.8 fL (ref 80.0–100.0)
Platelets: 165 10*3/uL (ref 150–400)
Platelets: 178 10*3/uL (ref 150–400)
Platelets: 183 10*3/uL (ref 150–400)
RBC: 3.04 MIL/uL — ABNORMAL LOW (ref 4.22–5.81)
RBC: 3.14 MIL/uL — ABNORMAL LOW (ref 4.22–5.81)
RBC: 3.24 MIL/uL — ABNORMAL LOW (ref 4.22–5.81)
RDW: 14.7 % (ref 11.5–15.5)
RDW: 14.7 % (ref 11.5–15.5)
RDW: 14.8 % (ref 11.5–15.5)
WBC: 5.5 10*3/uL (ref 4.0–10.5)
WBC: 5.5 10*3/uL (ref 4.0–10.5)
WBC: 5.7 10*3/uL (ref 4.0–10.5)
nRBC: 0 % (ref 0.0–0.2)
nRBC: 0 % (ref 0.0–0.2)
nRBC: 0 % (ref 0.0–0.2)

## 2023-03-23 LAB — BASIC METABOLIC PANEL
Anion gap: 7 (ref 5–15)
BUN: 16 mg/dL (ref 8–23)
CO2: 23 mmol/L (ref 22–32)
Calcium: 8.2 mg/dL — ABNORMAL LOW (ref 8.9–10.3)
Chloride: 106 mmol/L (ref 98–111)
Creatinine, Ser: 1 mg/dL (ref 0.61–1.24)
GFR, Estimated: >60 mL/min (ref >60)
Glucose, Bld: 217 mg/dL — ABNORMAL HIGH (ref 70–99)
Potassium: 3.7 mmol/L (ref 3.5–5.1)
Sodium: 136 mmol/L (ref 135–145)

## 2023-03-23 LAB — TYPE AND SCREEN
ABO/RH(D): O NEG
Antibody Screen: NEGATIVE

## 2023-03-23 LAB — HEMOGLOBIN A1C
Hgb A1c MFr Bld: 7.7 % — ABNORMAL HIGH (ref 4.8–5.6)
Mean Plasma Glucose: 174.29 mg/dL

## 2023-03-23 LAB — HEMOGLOBIN: Hemoglobin: 9.2 g/dL — ABNORMAL LOW (ref 13.0–17.0)

## 2023-03-23 LAB — SARS CORONAVIRUS 2 BY RT PCR: SARS Coronavirus 2 by RT PCR: NEGATIVE

## 2023-03-23 LAB — PROCALCITONIN: Procalcitonin: <0.1 ng/mL

## 2023-03-23 MED ORDER — INSULIN ASPART 100 UNIT/ML IJ SOLN
0.0000 [IU] | Freq: Three times a day (TID) | INTRAMUSCULAR | Status: DC
  Administered 2023-03-23: 5 [IU] via SUBCUTANEOUS
  Filled 2023-03-23: qty 1

## 2023-03-23 NOTE — Progress Notes (Signed)
Pt admitted to room, accompanied by wife. Oriented to room, unit, fall prevention protocol and call bell system. Pt items placed within reach, bed alarm activated.

## 2023-03-23 NOTE — Plan of Care (Signed)
Problem: Activity: Goal: Ability to tolerate increased activity will improve Outcome: Progressing   Problem: Cardiac: Goal: Ability to achieve and maintain adequate cardiovascular perfusion will improve Outcome: Progressing   Problem: Health Behavior/Discharge Planning: Goal: Ability to safely manage health-related needs after discharge will improve Outcome: Progressing

## 2023-03-23 NOTE — Progress Notes (Signed)
The patient has been discharged. IV has been removed. Education has been completed with the patient and wife. The patient has been wheeled down to his vehicle.

## 2023-03-23 NOTE — Plan of Care (Signed)
  Problem: Education: Goal: Understanding of cardiac disease, CV risk reduction, and recovery process will improve Outcome: Progressing Goal: Individualized Educational Video(s) Outcome: Progressing   Problem: Activity: Goal: Ability to tolerate increased activity will improve Outcome: Progressing   Problem: Cardiac: Goal: Ability to achieve and maintain adequate cardiovascular perfusion will improve Outcome: Progressing   Problem: Health Behavior/Discharge Planning: Goal: Ability to safely manage health-related needs after discharge will improve Outcome: Progressing   Problem: Education: Goal: Understanding of CV disease, CV risk reduction, and recovery process will improve Outcome: Progressing Goal: Individualized Educational Video(s) Outcome: Progressing   Problem: Activity: Goal: Ability to return to baseline activity level will improve Outcome: Progressing   Problem: Cardiovascular: Goal: Ability to achieve and maintain adequate cardiovascular perfusion will improve Outcome: Progressing Goal: Vascular access site(s) Level 0-1 will be maintained Outcome: Progressing   Problem: Health Behavior/Discharge Planning: Goal: Ability to safely manage health-related needs after discharge will improve Outcome: Progressing   Problem: Education: Goal: Ability to describe self-care measures that may prevent or decrease complications (Diabetes Survival Skills Education) will improve Outcome: Progressing Goal: Individualized Educational Video(s) Outcome: Progressing   Problem: Coping: Goal: Ability to adjust to condition or change in health will improve Outcome: Progressing   Problem: Health Behavior/Discharge Planning: Goal: Ability to identify and utilize available resources and services will improve Outcome: Progressing Goal: Ability to manage health-related needs will improve Outcome: Progressing   Problem: Fluid Volume: Goal: Ability to maintain a balanced intake and  output will improve Outcome: Progressing   Problem: Metabolic: Goal: Ability to maintain appropriate glucose levels will improve Outcome: Progressing   Problem: Skin Integrity: Goal: Risk for impaired skin integrity will decrease Outcome: Progressing   Problem: Tissue Perfusion: Goal: Adequacy of tissue perfusion will improve Outcome: Progressing

## 2023-03-23 NOTE — Discharge Summary (Signed)
Physician Discharge Summary  Ryan Garcia ZOX:[id number] DOB: [birth year] DOA: 03/22/2023  PCP: Judy Pimple, MD  Admit date: 03/22/2023 Discharge date: 03/23/2023  Admitted From: Home Disposition:  Home  Recommendations for Outpatient Follow-up:  Follow up with PCP in 1-2 weeks Repeat CBC in 1 week  Home Health: No Equipment/Devices: None  Discharge Condition: Stable CODE STATUS: Full Diet recommendation: Regular  Brief/Interim Summary: 89 y.o. male with medical history significant of  CAD s/p DESx2-RCA, paroxysmal atrial fibrillation on Eliquis, hypertension, hyperlipidemia, type 2 diabetes, obesity, OSA on CPAP, colon cancer, CKD-3a,    Patient reports 8 days ago he lost control of his [name] and started rolling away.  He got in front of it to attempt to stop it and it hit him on the left side of his body.  He has injury to left flank area, causing bruises and pain in left flank area and left lower chest, which is constant, aching, moderate, nonradiating.  Aggravated by turning around.  Patient denies cough, shortness of breath.  No nausea, vomiting, diarrhea or abdominal pain.  No symptoms of UTI.  Patient has constipation intermittently. Pt was seen by his primary care provider today who got an emergent CT of the abdomen/pelvis which showed concern for bleeding around the kidney.  Sent to the ED for further evaluation.    Patient strongly denies any head or neck injury.  Denies loss of consciousness.  He and his wife refused CT scan of the head.  Patient denies subjective fever and chills, but was found to have temperature 100.5 which became 98.7 on repeated measurement in ED  9/21: Case discussed between EDP, general surgery, urology.  No surgical invention warranted.  Recommend overnight monitoring.  Hemoglobin remained relatively stable.  No evidence of acute blood loss.  Lengthy discussion with patient and wife at time of discharge.  Patient has a history of  atherosclerosis on antiplatelet and atrial fibrillation on Eliquis.  Patient is recommended to resume his Plavix on 9/22.  He is recommended to hold Eliquis x 1 week and resume on 9/28.  He is instructed to see his primary care physician within 1 week of discharge.    Discharge Diagnoses:  Principal Problem:   Renal cyst, native, hemorrhage Active Problems:   Acute blood loss anemia   Fever   Primary hypertension   CAD S/P percutaneous coronary angioplasty   Paroxysmal atrial flutter (HCC)   HLD (hyperlipidemia)   Chronic kidney disease, stage 3a (HCC)   Type II diabetes mellitus with renal manifestations (HCC)   Obstructive sleep apnea on CPAP   Obesity (BMI 30-39.9)   Left rib fracture  Hemorrhagic renal cysts Acute blood loss anemia Patient is hemoglobin dropped from baseline 13.2 to low of 9.2.  Traumatic event happened 8 days prior.  Do not suspect active blood loss at this time.  Patient is anticoagulated however.  At time of discharge will recommend resumption of home Plavix and holding Eliquis x 1 week.  Can resume Eliquis on 9/28.  Recommend to see primary care within a week of discharge and get repeat lab work.  Hemoglobin 9.2 at time of discharge.  Does not meet criteria for transfusion.  Discharge Instructions  Discharge Instructions     Diet - low sodium heart healthy   Complete by: As directed    Increase activity slowly   Complete by: As directed       Allergies as of 03/23/2023       Reactions  Loratadine Other (See Comments)   Not effective   Simvastatin Other (See Comments)   Intolerant- caused joint pain        Medication List     TAKE these medications    acetaminophen 325 MG tablet Commonly known as: TYLENOL Take 325-650 mg by mouth every 6 (six) hours as needed for mild pain or headache.   B-D SINGLE USE SWABS REGULAR Pads Use to check blood sugar 2 times a day   cetirizine 10 MG tablet Commonly known as: ZYRTEC Take 10 mg by mouth  daily.   clopidogrel 75 MG tablet Commonly known as: PLAVIX Take 1 tablet (75 mg total) by mouth daily.   CO Q 10 PO Take 200 mg by mouth daily.   Droplet Pen Needles 31G X 6 MM Misc Generic drug: Insulin Pen Needle USE ONE TIME DAILY  AT  BEDTIME  WITH  LANTUS   Eliquis 5 MG Tabs tablet Generic drug: apixaban TAKE 1 TABLET TWICE DAILY   Flonase 50 MCG/ACT nasal spray Generic drug: fluticasone Place 1 spray into both nostrils daily as needed for allergies.   FreeStyle Libre 3 Reader [employer] 3 Sensor Misc USE 1 SENSOR EVERY 14 DAYS   furosemide 20 MG tablet Commonly known as: LASIX TAKE 1 TABLET EVERY DAY   isosorbide mononitrate 30 MG 24 hr tablet Commonly known as: IMDUR Take 3 tablets (90 mg total) by mouth daily.   Lantus SoloStar 100 UNIT/ML Solostar Pen Generic drug: insulin glargine Inject 48 Units into the skin daily.   metFORMIN 1000 MG tablet Commonly known as: GLUCOPHAGE Take 1 tablet (1,000 mg total) by mouth 2 (two) times daily with a meal.   metoprolol tartrate 25 MG tablet Commonly known as: LOPRESSOR TAKE 1/2 TABLET TWICE DAILY   nitroGLYCERIN 0.4 MG SL tablet Commonly known as: Nitrostat Place 1 tablet (0.4 mg total) under the tongue every 5 (five) minutes as needed for chest pain.   NovoLOG FlexPen 100 UNIT/ML FlexPen Generic drug: insulin aspart Inject 16 Units into the skin every evening.   rosuvastatin 40 MG tablet Commonly known as: CRESTOR TAKE 1 TABLET EVERY DAY   True Metrix Air Glucose Meter w/Device Kit 1 kit by Other route 2 (two) times daily. Check blood sugar twice daily and as directed. Dx E11.65   True Metrix Blood Glucose Test test strip Generic drug: glucose blood CHECK BLOOD SUGAR TWICE DAILY   True Metrix Level 1 Low Soln Use to check control on glucose meter   TRUEplus Lancets 30G Misc Check blood sugar twice daily and as directed. Dx E11.65   Vitamin B12 1000 MCG Tbcr Take 1,000 mcg by mouth  daily.        Follow-up Information     Tower, Audrie Gallus, MD. Schedule an appointment as soon as possible for a visit in 1 week(s).   Specialties: Family Medicine, Radiology Why: Repeat CBC in 1 week Contact information: [address] [place] [address] [phone]                Allergies  Allergen Reactions   Loratadine Other (See Comments)    Not effective   Simvastatin Other (See Comments)    Intolerant- caused joint pain    Consultations: None   Procedures/Studies: CT Angio Chest/Abd/Pel for Dissection W and/or Wo Contrast  Result Date: 03/22/2023 CLINICAL DATA:  traumatic injury 8 days ago, hit by tractor to side, bruising along left flank, non-con CT of abd  with fluid around the kidney, evaluating for active source of bleed as well as rib fractures to the left side EXAM: CT ANGIOGRAPHY CHEST, ABDOMEN AND PELVIS TECHNIQUE: Non-contrast CT of the chest was initially obtained. Multidetector CT imaging through the chest, abdomen and pelvis was performed using the standard protocol during bolus administration of intravenous contrast. Multiplanar reconstructed images and MIPs were obtained and reviewed to evaluate the vascular anatomy. RADIATION DOSE REDUCTION: This exam was performed according to the departmental dose-optimization program which includes automated exposure control, adjustment of the mA and/or kV according to patient size and/or use of iterative reconstruction technique. CONTRAST:  OMNIPAQUE IOHEXOL 350 MG/ML SOLN COMPARISON:  CT abdomen pelvis 03/22/2023, CT angio chest 04/14/2020 FINDINGS: CTA CHEST FINDINGS Cardiovascular: Preferential opacification of the thoracic aorta. No evidence of thoracic aortic aneurysm or dissection. Prominent heart size. No significant pericardial effusion. Mild atherosclerotic plaque of the thoracic aorta. At least 3 vessel coronary artery calcifications. The main pulmonary artery is normal in caliber. No pulmonary  embolus. Mediastinum/Nodes: Stable chronic posterior right mediastinal lymph node measuring 1.1 cm (5:237). No enlarged mediastinal, hilar, or axillary lymph nodes. Thyroid gland, trachea, and esophagus demonstrate no significant findings. Tiny hiatal hernia. Lungs/Pleura: No focal consolidation. Stable chronic centrally calcified 1.3 x 1 cm right lower lobe pulmonary nodule-likely granuloma, no further follow-up indicated. No pulmonary mass. Trace left pleural effusion. No pneumothorax. Musculoskeletal: No chest wall abnormality. no suspicious lytic or blastic osseous lesions. Acute minimally displaced fracture of the left posterior eleventh rib. Multilevel degenerative changes of the spine. Review of the MIP images confirms the above findings. CTA ABDOMEN AND PELVIS FINDINGS VASCULAR Aorta: At least moderate atherosclerotic plaque. Normal caliber aorta without aneurysm, dissection, vasculitis or significant stenosis. Celiac: Patent without evidence of aneurysm, dissection, vasculitis or significant stenosis. SMA: Patent without evidence of aneurysm, dissection, vasculitis or significant stenosis. Renals: Both renal arteries are patent without evidence of aneurysm, dissection, vasculitis, fibromuscular dysplasia or significant stenosis. IMA: Patent without evidence of aneurysm, dissection, vasculitis or significant stenosis. Inflow: Mild atherosclerotic plaque. Patent without evidence of aneurysm, dissection, vasculitis or significant stenosis. Veins: No obvious venous abnormality within the limitations of this arterial phase study. Review of the MIP images confirms the above findings. NON-VASCULAR Hepatobiliary: No focal liver abnormality. Calcified gallstone noted within the gallbladder lumen. No gallbladder wall thickening or pericholecystic fluid. No biliary dilatation. Pancreas: No focal lesion. Normal pancreatic contour. No surrounding inflammatory changes. No main pancreatic ductal dilatation. Spleen: Normal  in size without focal abnormality. Adrenals/Urinary Tract: No adrenal nodule bilaterally. Bilateral kidneys enhance symmetrically. Grossly stable in size and appearance of a 11.5 x 11.5 cm heterogeneous left renal lesion with intralesional high density material and surrounding perinephric fat stranding. High density material within the left posterior perinephric space again noted and grossly stable (7:123). Fluid density lesions within the bilateral kidneys likely represent simple renal cysts. Simple renal cysts, in the absence of clinically indicated signs/symptoms, require no independent follow-up. No hydronephrosis. No hydroureter. The urinary bladder is unremarkable. Stomach/Bowel: Partial right colectomy surgical changes. Stomach is within normal limits. No evidence of bowel wall thickening or dilatation. Colonic diverticulosis. Lymphatic: No lymphadenopathy. Reproductive: Enlarged prostate measuring up to 4.9 cm. Other: Small volume hyperdense free fluid along the left retroperitoneum extending to the pelvis (5: 496, 615, 808). No intraperitoneal free fluid. No intraperitoneal free gas. No organized fluid collection. Musculoskeletal: No abdominal wall hernia or abnormality. No suspicious lytic or blastic osseous lesions. No acute displaced fracture. Multilevel degenerative changes of  the spine. Review of the MIP images confirms the above findings. IMPRESSION: 1. No acute vascular abnormality. Limited evaluation for active hemorrhage of known renal injury given arterial phase only study. 2. Grossly stable in size and appearance of a 11.5 x 11.5 cm heterogeneous left renal lesion with intralesional high density material and surrounding perinephric fat stranding. Findings suggestive of acute hemorrhage of a cyst with redemonstration of mild surrounding hemorrhage in the left posterior perinephric space. When the patient is clinically stable and able to follow directions and hold their breath (preferably as an  outpatient) further evaluation with dedicated outpatient MRI renal protocol should be considered to exclude underlying malignancy. 3. Acute minimally displaced fracture of the left posterior eleventh rib. 4. Trace left rib pleural effusion. 5. Other imaging findings of potential clinical significance: Tiny hiatal hernia. Cholelithiasis with no CT finding of acute cholecystitis. Colonic diverticulosis with no acute diverticulitis. Prostatomegaly. Aortic Atherosclerosis (ICD10-I70.0). Electronically Signed   By: Tish Frederickson M.D.   On: 03/22/2023 20:36   CT ABDOMEN PELVIS WO CONTRAST  Result Date: 03/22/2023 CLINICAL DATA:  Accident with tractor 8 days ago. Blunt abdominal trauma. Left-sided abdominal and flank pain. Previous colon resection for colon carcinoma. * Tracking Code: BO * EXAM: CT ABDOMEN AND PELVIS WITHOUT CONTRAST TECHNIQUE: Multidetector CT imaging of the abdomen and pelvis was performed following the standard protocol without IV contrast. RADIATION DOSE REDUCTION: This exam was performed according to the departmental dose-optimization program which includes automated exposure control, adjustment of the mA and/or kV according to patient size and/or use of iterative reconstruction technique. COMPARISON:  12/21/2022 FINDINGS: Lower chest: Tiny left pleural effusion. Hepatobiliary: No hepatic parenchymal injury or mass identified on this noncontrast exam. Gallstones are seen, however there is no evidence of cholecystitis or biliary dilatation. Pancreas: No parenchymal abnormality identified on this noncontrast exam. Spleen: No evidence of parenchymal injury on this noncontrast exam. Adrenal/Urinary Tract: Several bilateral renal cysts are again seen (No followup imaging is recommended). Acute hemorrhage is now seen in 1 of these large cysts involving the posterior midpole of the left kidney, with mild surrounding hemorrhage in the left posterior perinephric space. This raises suspicion for  underlying renal parenchymal laceration. No evidence of ureteral calculi or hydronephrosis. Unremarkable unopacified urinary bladder. Stomach/Bowel: Previous right colectomy noted. No evidence of hemoperitoneum. Diverticulosis is seen mainly involving the descending and sigmoid colon, however there is no evidence of diverticulitis. Vascular/Lymphatic: No pathologically enlarged lymph nodes identified. No evidence of abdominal aortic aneurysm. Reproductive:  No mass or other significant abnormality identified. Other:  None. Musculoskeletal: Nondisplaced fracture is seen involving the left posterior 11th rib. IMPRESSION: Acute hemorrhage in 1 of the large cysts involving the left kidney, with mild surrounding hemorrhage in the left posterior perinephric space. This is highly suspicious for left renal parenchymal laceration. Nondisplaced fracture of left posterior 11th rib. Tiny left pleural effusion. Cholelithiasis. No radiographic evidence of cholecystitis. Colonic diverticulosis. No radiographic evidence of diverticulitis. These results were called by telephone on 03/22/2023 at 5:02 pm to provider AMY BEDSOLE by the radiology technologist at the [facility]. Electronically Signed   By: Danae Orleans M.D.   On: 03/22/2023 17:04   DG Chest 2 View  Result Date: 03/22/2023 CLINICAL DATA:  left lateral rib pain and fever, S/P blunt trauma to abdpmen and left ribcage. EXAM: CHEST - 2 VIEW COMPARISON:  Chest x-ray December 21, 2022. FINDINGS: Nodular opacity at the right lung base. Mild streaky left basilar opacities. Otherwise, clear lungs. No visible pleural  effusions or pneumothorax. No evidence of displaced fracture. Cardiomediastinal silhouette is similar. IMPRESSION: 1. Nodular opacity at the right lung base may represent nipple shadow. Recommend repeat chest x-ray with nipple markers to confirm and exclude pulmonary nodule. 2. Mild streaky left basilar opacities, likely atelectasis. 3. No evidence of rib fracture or  pneumothorax. Electronically Signed   By: Feliberto Harts M.D.   On: 03/22/2023 16:09      Subjective: Seen and examined on the day of discharge.  Stable no distress.  Appropriate for discharge home.  Discharge Exam: Vitals:   03/23/23 0433 03/23/23 0757  BP: (!) 147/73 136/67  Pulse: 65 74  Resp: 18 16  Temp: 98 F (36.7 C) 98.1 F (36.7 C)  SpO2: 100% 100%   Vitals:   03/22/23 2230 03/22/23 2306 03/23/23 0433 03/23/23 0757  BP: (!) 156/80 (!) 151/69 (!) 147/73 136/67  Pulse: 77 78 65 74  Resp: 17 18 18 16   Temp:  98.8 F (37.1 C) 98 F (36.7 C) 98.1 F (36.7 C)  TempSrc:  Oral Oral Oral  SpO2: 95% 96% 100% 100%  Weight:      Height:        General: Pt is alert, awake, not in acute distress Cardiovascular: RRR, S1/S2 +, no rubs, no gallops Respiratory: CTA bilaterally, no wheezing, no rhonchi Abdominal: Soft, NT, ND, bowel sounds + Extremities: no edema, no cyanosis    The results of significant diagnostics from this hospitalization (including imaging, microbiology, ancillary and laboratory) are listed below for reference.     Microbiology: Recent Results (from the past 240 hour(s))  Culture, blood (Routine X 2) w Reflex to ID Panel     Status: None (Preliminary result)   Collection Time: 03/22/23 11:45 PM   Specimen: BLOOD  Result Value Ref Range Status   Specimen Description BLOOD BLOOD RIGHT ARM  Final   Special Requests   Final    BOTTLES DRAWN AEROBIC AND ANAEROBIC Blood Culture adequate volume   Culture   Final    NO GROWTH < 12 HOURS Performed at [facility], [address], [place], [place] [address]    Report Status PENDING  Incomplete  Culture, blood (Routine X 2) w Reflex to ID Panel     Status: None (Preliminary result)   Collection Time: 03/22/23 11:51 PM   Specimen: BLOOD  Result Value Ref Range Status   Specimen Description BLOOD BLOOD LEFT ARM  Final   Special Requests   Final    BOTTLES DRAWN AEROBIC AND ANAEROBIC Blood  Culture adequate volume   Culture   Final    NO GROWTH < 12 HOURS Performed at [facility], [address] [place], [place] [address]    Report Status PENDING  Incomplete  SARS Coronavirus 2 by RT PCR (hospital order, performed in [facility] hospital lab) *cepheid single result test* Anterior Nasal Swab     Status: None   Collection Time: 03/23/23 12:25 AM   Specimen: Anterior Nasal Swab  Result Value Ref Range Status   SARS Coronavirus 2 by RT PCR NEGATIVE NEGATIVE Final    Comment: (NOTE) SARS-CoV-2 target nucleic acids are NOT DETECTED.  The SARS-CoV-2 RNA is generally detectable in upper and lower respiratory specimens during the acute phase of infection. The lowest concentration of SARS-CoV-2 viral copies this assay can detect is 250 copies / mL. A negative result does not preclude SARS-CoV-2 infection and should not be used as the sole basis for treatment or other  patient management decisions.  A negative result may occur with improper specimen collection / handling, submission of specimen other than nasopharyngeal swab, presence of viral mutation(s) within the areas targeted by this assay, and inadequate number of viral copies (<250 copies / mL). A negative result must be combined with clinical observations, patient history, and epidemiological information.  Fact Sheet for Patients:   RoadLapTop.co.za  Fact Sheet for Healthcare Providers: http://kim-miller.com/  This test is not yet approved or  cleared by the [place] FDA and has been authorized for detection and/or diagnosis of SARS-CoV-2 by FDA under an Emergency Use Authorization (EUA).  This EUA will remain in effect (meaning this test can be used) for the duration of the COVID-19 declaration under Section 564(b)(1) of the Act, 21 U.S.C. section 360bbb-3(b)(1), unless the authorization is terminated or revoked sooner.  Performed at [facility],  [address] [place], [place] [address]      Labs: BNP (last 3 results) Recent Labs    07/31/22 0220 08/08/22 0741  BNP 122.8* 292.0*   Basic Metabolic Panel: Recent Labs  Lab 03/22/23 1801 03/23/23 0330  NA 137 136  K 4.1 3.7  CL 103 106  CO2 25 23  GLUCOSE 246* 217*  BUN 18 16  CREATININE 1.21 1.00  CALCIUM 8.4* 8.2*   Liver Function Tests: Recent Labs  Lab 03/22/23 1801  AST 17  ALT 18  ALKPHOS 69  BILITOT 1.4*  PROT 6.1*  ALBUMIN 3.1*   Recent Labs  Lab 03/22/23 1801  LIPASE 24   No results for input(s): "AMMONIA" in the last 168 hours. CBC: Recent Labs  Lab 03/22/23 1801 03/22/23 2345 03/23/23 0330 03/23/23 0955 03/23/23 1438  WBC 6.4 5.7 5.5 5.5  --   NEUTROABS 4.3  --   --   --   --   HGB 10.1* 10.2* 9.4* 9.8* 9.2*  HCT 30.1* 30.4* 27.6* 29.1*  --   MCV 93.8 93.8 90.8 92.7  --   PLT 179 178 165 183  --    Cardiac Enzymes: No results for input(s): "CKTOTAL", "CKMB", "CKMBINDEX", "TROPONINI" in the last 168 hours. BNP: Invalid input(s): "POCBNP" CBG: Recent Labs  Lab 03/22/23 2345  GLUCAP 278*   D-Dimer No results for input(s): "DDIMER" in the last 72 hours. Hgb A1c No results for input(s): "HGBA1C" in the last 72 hours. Lipid Profile No results for input(s): "CHOL", "HDL", "LDLCALC", "TRIG", "CHOLHDL", "LDLDIRECT" in the last 72 hours. Thyroid function studies No results for input(s): "TSH", "T4TOTAL", "T3FREE", "THYROIDAB" in the last 72 hours.  Invalid input(s): "FREET3" Anemia work up No results for input(s): "VITAMINB12", "FOLATE", "FERRITIN", "TIBC", "IRON", "RETICCTPCT" in the last 72 hours. Urinalysis    Component Value Date/Time   COLORURINE STRAW (A) 03/22/2023 1943   APPEARANCEUR CLEAR (A) 03/22/2023 1943   LABSPEC 1.025 03/22/2023 1943   PHURINE 6.0 03/22/2023 1943   GLUCOSEU >=500 (A) 03/22/2023 1943   HGBUR NEGATIVE 03/22/2023 1943   BILIRUBINUR NEGATIVE 03/22/2023 1943   BILIRUBINUR Negative 09/12/2022  1645   KETONESUR NEGATIVE 03/22/2023 1943   PROTEINUR 30 (A) 03/22/2023 1943   UROBILINOGEN 0.2 09/12/2022 1645   NITRITE NEGATIVE 03/22/2023 1943   LEUKOCYTESUR NEGATIVE 03/22/2023 1943   Sepsis Labs Recent Labs  Lab 03/22/23 1801 03/22/23 2345 03/23/23 0330 03/23/23 0955  WBC 6.4 5.7 5.5 5.5   Microbiology Recent Results (from the past 240 hour(s))  Culture, blood (Routine X 2) w Reflex to ID Panel     Status: None (  Preliminary result)   Collection Time: 03/22/23 11:45 PM   Specimen: BLOOD  Result Value Ref Range Status   Specimen Description BLOOD BLOOD RIGHT ARM  Final   Special Requests   Final    BOTTLES DRAWN AEROBIC AND ANAEROBIC Blood Culture adequate volume   Culture   Final    NO GROWTH < 12 HOURS Performed at [facility], [address] [place], [place] [address]    Report Status PENDING  Incomplete  Culture, blood (Routine X 2) w Reflex to ID Panel     Status: None (Preliminary result)   Collection Time: 03/22/23 11:51 PM   Specimen: BLOOD  Result Value Ref Range Status   Specimen Description BLOOD BLOOD LEFT ARM  Final   Special Requests   Final    BOTTLES DRAWN AEROBIC AND ANAEROBIC Blood Culture adequate volume   Culture   Final    NO GROWTH < 12 HOURS Performed at [facility], [address], [place], [place] [address]    Report Status PENDING  Incomplete  SARS Coronavirus 2 by RT PCR (hospital order, performed in [facility] hospital lab) *cepheid single result test* Anterior Nasal Swab     Status: None   Collection Time: 03/23/23 12:25 AM   Specimen: Anterior Nasal Swab  Result Value Ref Range Status   SARS Coronavirus 2 by RT PCR NEGATIVE NEGATIVE Final    Comment: (NOTE) SARS-CoV-2 target nucleic acids are NOT DETECTED.  The SARS-CoV-2 RNA is generally detectable in upper and lower respiratory specimens during the acute phase of infection. The lowest concentration of SARS-CoV-2 viral copies this assay can detect is  250 copies / mL. A negative result does not preclude SARS-CoV-2 infection and should not be used as the sole basis for treatment or other patient management decisions.  A negative result may occur with improper specimen collection / handling, submission of specimen other than nasopharyngeal swab, presence of viral mutation(s) within the areas targeted by this assay, and inadequate number of viral copies (<250 copies / mL). A negative result must be combined with clinical observations, patient history, and epidemiological information.  Fact Sheet for Patients:   RoadLapTop.co.za  Fact Sheet for Healthcare Providers: http://kim-miller.com/  This test is not yet approved or  cleared by the [place] FDA and has been authorized for detection and/or diagnosis of SARS-CoV-2 by FDA under an Emergency Use Authorization (EUA).  This EUA will remain in effect (meaning this test can be used) for the duration of the COVID-19 declaration under Section 564(b)(1) of the Act, 21 U.S.C. section 360bbb-3(b)(1), unless the authorization is terminated or revoked sooner.  Performed at [facility], [address] [place], [place] [address]      Time coordinating discharge: Over 30 minutes  SIGNED:   Tresa Moore, MD  [facility] 03/23/2023, 3:07 PM Pager   If 7PM-7AM, please contact night-coverage

## 2023-03-25 LAB — GLUCOSE, CAPILLARY
Glucose-Capillary: 175 mg/dL — ABNORMAL HIGH (ref 70–99)
Glucose-Capillary: 225 mg/dL — ABNORMAL HIGH (ref 70–99)

## 2023-03-26 DIAGNOSIS — C44319 Basal cell carcinoma of skin of other parts of face: Secondary | ICD-10-CM | POA: Diagnosis not present

## 2023-03-28 LAB — CULTURE, BLOOD (ROUTINE X 2)
Culture: NO GROWTH
Culture: NO GROWTH
Special Requests: ADEQUATE
Special Requests: ADEQUATE

## 2023-03-29 ENCOUNTER — Encounter: Payer: Self-pay | Admitting: Family Medicine

## 2023-03-29 ENCOUNTER — Ambulatory Visit (INDEPENDENT_AMBULATORY_CARE_PROVIDER_SITE_OTHER): Payer: Medicare HMO | Admitting: Family Medicine

## 2023-03-29 VITALS — BP 110/52 | HR 70 | Temp 98.8°F | Ht 71.0 in | Wt 239.0 lb

## 2023-03-29 DIAGNOSIS — I4892 Unspecified atrial flutter: Secondary | ICD-10-CM | POA: Diagnosis not present

## 2023-03-29 DIAGNOSIS — N281 Cyst of kidney, acquired: Secondary | ICD-10-CM | POA: Diagnosis not present

## 2023-03-29 DIAGNOSIS — D649 Anemia, unspecified: Secondary | ICD-10-CM

## 2023-03-29 DIAGNOSIS — I10 Essential (primary) hypertension: Secondary | ICD-10-CM

## 2023-03-29 DIAGNOSIS — N2889 Other specified disorders of kidney and ureter: Secondary | ICD-10-CM

## 2023-03-29 DIAGNOSIS — S2232XS Fracture of one rib, left side, sequela: Secondary | ICD-10-CM | POA: Diagnosis not present

## 2023-03-29 DIAGNOSIS — S3991XS Unspecified injury of abdomen, sequela: Secondary | ICD-10-CM | POA: Diagnosis not present

## 2023-03-29 DIAGNOSIS — Z7984 Long term (current) use of oral hypoglycemic drugs: Secondary | ICD-10-CM

## 2023-03-29 DIAGNOSIS — R109 Unspecified abdominal pain: Secondary | ICD-10-CM | POA: Diagnosis not present

## 2023-03-29 DIAGNOSIS — E1165 Type 2 diabetes mellitus with hyperglycemia: Secondary | ICD-10-CM | POA: Diagnosis not present

## 2023-03-29 DIAGNOSIS — N1831 Chronic kidney disease, stage 3a: Secondary | ICD-10-CM

## 2023-03-29 LAB — CBC WITH DIFFERENTIAL/PLATELET
Basophils Absolute: 0 10*3/uL (ref 0.0–0.1)
Basophils Relative: 0.5 % (ref 0.0–3.0)
Eosinophils Absolute: 0.2 10*3/uL (ref 0.0–0.7)
Eosinophils Relative: 3.8 % (ref 0.0–5.0)
HCT: 32.4 % — ABNORMAL LOW (ref 39.0–52.0)
Hemoglobin: 10.5 g/dL — ABNORMAL LOW (ref 13.0–17.0)
Lymphocytes Relative: 18.5 % (ref 12.0–46.0)
Lymphs Abs: 1.2 10*3/uL (ref 0.7–4.0)
MCHC: 32.4 g/dL (ref 30.0–36.0)
MCV: 93.5 fL (ref 78.0–100.0)
Monocytes Absolute: 0.8 10*3/uL (ref 0.1–1.0)
Monocytes Relative: 11.9 % (ref 3.0–12.0)
Neutro Abs: 4.2 10*3/uL (ref 1.4–7.7)
Neutrophils Relative %: 65.3 % (ref 43.0–77.0)
Platelets: 294 10*3/uL (ref 150.0–400.0)
RBC: 3.47 Mil/uL — ABNORMAL LOW (ref 4.22–5.81)
RDW: 15.6 % — ABNORMAL HIGH (ref 11.5–15.5)
WBC: 6.4 10*3/uL (ref 4.0–10.5)

## 2023-03-29 LAB — BASIC METABOLIC PANEL
BUN: 18 mg/dL (ref 6–23)
CO2: 27 meq/L (ref 19–32)
Calcium: 8.6 mg/dL (ref 8.4–10.5)
Chloride: 103 meq/L (ref 96–112)
Creatinine, Ser: 1.22 mg/dL (ref 0.40–1.50)
GFR: 53.46 mL/min — ABNORMAL LOW (ref >60.00)
Glucose, Bld: 288 mg/dL — ABNORMAL HIGH (ref 70–99)
Potassium: 4.5 meq/L (ref 3.5–5.1)
Sodium: 138 meq/L (ref 135–145)

## 2023-03-29 LAB — IRON: Iron: 44 ug/dL (ref 42–165)

## 2023-03-29 NOTE — Assessment & Plan Note (Signed)
Much symptomatic improvement   Reviewed hospital records, lab results and studies in detail  (CT and cxr) Reassuring exam
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
## 2023-03-29 NOTE — Assessment & Plan Note (Signed)
Clinically improved Reviewed hospital records, lab results and studies in detail  See a/p above/ renal injury

## 2023-03-29 NOTE — Assessment & Plan Note (Signed)
Hb went down to 9.2 during hosp for hemorrhagic renal cyst/ renal trauma  Reviewed hospital records, lab results and studies in detail   He is feeling better pain wise but still tired   Lab today for cbc and iron  Ref to urology

## 2023-03-29 NOTE — Assessment & Plan Note (Signed)
Lab today post hosp for renal trauma  GFR was over 60 on 9/21

## 2023-03-29 NOTE — Assessment & Plan Note (Signed)
S/p hosp for renal trauma and above  Hb did drop to 9.2 Clinical improvement noted Reassuring exam Still some fatigue  Lab today

## 2023-03-29 NOTE — Assessment & Plan Note (Signed)
Started back on eliquis today after holding for kidney injury/bleeding

## 2023-03-29 NOTE — Assessment & Plan Note (Addendum)
S/p hosp for STEMI doing well  bp in fair control at this time  BP Readings from Last 1 Encounters:  03/29/23 (!) 110/52  Stable in setting of some light headedness/ no orthostatic change  No changes needed Most recent labs reviewed  Disc lifstyle change with low sodium diet and exercise  Metoprolol 12.5 mg isosorbide  60 mg daily (the chart today says 90 mg daily but pt thinks he is taking 60 and will check with cardiology asap to see what he is supposed to be on)  Lasix 20 mg daily  Under cardiology care   Pulse rate on 2nd check today is normal at 70

## 2023-03-29 NOTE — Patient Instructions (Addendum)
I put the referral in for urology  Please let us know if you don't hear in 1 week to set this up   If pain worsens or symptoms change let us know   If you feel you need PT for weakness let us know   Labs today

## 2023-03-29 NOTE — Assessment & Plan Note (Signed)
Lab Results  Component Value Date   HGBA1C 7.7 (H) 03/23/2023   Up from 6.8  Has follow up with endocrinology soon  Metformin 1000 mg bid  Novolog 16 u with supper  Lantus 38 u daily

## 2023-04-04 DIAGNOSIS — E1142 Type 2 diabetes mellitus with diabetic polyneuropathy: Secondary | ICD-10-CM | POA: Diagnosis not present

## 2023-04-04 DIAGNOSIS — Z794 Long term (current) use of insulin: Secondary | ICD-10-CM | POA: Diagnosis not present

## 2023-04-04 DIAGNOSIS — I2510 Atherosclerotic heart disease of native coronary artery without angina pectoris: Secondary | ICD-10-CM | POA: Diagnosis not present

## 2023-04-04 DIAGNOSIS — I10 Essential (primary) hypertension: Secondary | ICD-10-CM | POA: Diagnosis not present

## 2023-04-04 DIAGNOSIS — E1122 Type 2 diabetes mellitus with diabetic chronic kidney disease: Secondary | ICD-10-CM | POA: Diagnosis not present

## 2023-04-04 DIAGNOSIS — E1159 Type 2 diabetes mellitus with other circulatory complications: Secondary | ICD-10-CM | POA: Diagnosis not present

## 2023-04-04 DIAGNOSIS — N1831 Chronic kidney disease, stage 3a: Secondary | ICD-10-CM | POA: Diagnosis not present

## 2023-04-12 ENCOUNTER — Ambulatory Visit: Payer: Medicare HMO | Admitting: Family Medicine

## 2023-04-12 ENCOUNTER — Ambulatory Visit
Admission: RE | Admit: 2023-04-12 | Discharge: 2023-04-12 | Disposition: A | Discharge status: Home / Self Care | Payer: Medicare HMO | Attending: Family Medicine | Admitting: Family Medicine

## 2023-04-12 ENCOUNTER — Encounter: Payer: Self-pay | Admitting: Family Medicine

## 2023-04-12 ENCOUNTER — Ambulatory Visit
Admission: RE | Admit: 2023-04-12 | Discharge: 2023-04-12 | Disposition: A | Discharge status: Home / Self Care | Payer: Medicare HMO | Source: Ambulatory Visit | Attending: Family Medicine | Admitting: Family Medicine

## 2023-04-12 VITALS — BP 136/78 | HR 65 | Temp 97.6°F | Ht 71.0 in | Wt 237.8 lb

## 2023-04-12 DIAGNOSIS — M25561 Pain in right knee: Secondary | ICD-10-CM | POA: Diagnosis not present

## 2023-04-12 DIAGNOSIS — M25461 Effusion, right knee: Secondary | ICD-10-CM | POA: Diagnosis not present

## 2023-04-12 DIAGNOSIS — Z043 Encounter for examination and observation following other accident: Secondary | ICD-10-CM | POA: Diagnosis not present

## 2023-04-12 NOTE — Progress Notes (Signed)
Marikay Alar, MD Phone: [phone]  Ryan Garcia is a 89 y.o. male who presents today for same day visit.  Right knee pain: Patient reports a fall yesterday.  He notes he tripped over his feet or the carpet as he was walking.  He fell directly on his right knee.  He notes no head injury.  No loss of consciousness.  He notes since then he has had 8 out of 10 discomfort when he is walking.  When he is sitting down its about a 1 out of 10.  Notes the pain is a little better today than it was yesterday.  He does take Tylenol for pain.  Social History   Tobacco Use  Smoking Status Never   Passive exposure: Never  Smokeless Tobacco Never    Current Outpatient Medications on File Prior to Visit  Medication Sig Dispense Refill   acetaminophen (TYLENOL) 325 MG tablet Take 325-650 mg by mouth every 6 (six) hours as needed for mild pain or headache.     Alcohol Swabs (B-D SINGLE USE SWABS REGULAR) PADS Use to check blood sugar 2 times a day 200 each 3   apixaban (ELIQUIS) 5 MG TABS tablet TAKE 1 TABLET TWICE DAILY 180 tablet 1   Blood Glucose Calibration (TRUE METRIX LEVEL 1) Low SOLN Use to check control on glucose meter 1 each 3   Blood Glucose Monitoring Suppl (TRUE METRIX AIR GLUCOSE METER) w/Device KIT 1 kit by Other route 2 (two) times daily. Check blood sugar twice daily and as directed. Dx E11.65 1 kit 0   cetirizine (ZYRTEC) 10 MG tablet Take 10 mg by mouth daily.     clopidogrel (PLAVIX) 75 MG tablet Take 1 tablet (75 mg total) by mouth daily. 90 tablet 3   Coenzyme Q10 (CO Q 10 PO) Take 200 mg by mouth daily.     Continuous Glucose Receiver (FREESTYLE LIBRE 3 READER) DEVI      Continuous Glucose Sensor (FREESTYLE LIBRE 3 SENSOR) MISC USE 1 SENSOR EVERY 14 DAYS     Cyanocobalamin (VITAMIN B12) 1000 MCG TBCR Take 1,000 mcg by mouth daily. 30 tablet    DROPLET PEN NEEDLES 31G X 6 MM MISC USE ONE TIME DAILY  AT  BEDTIME  WITH  LANTUS 100 each 3   fluticasone (FLONASE) 50 MCG/ACT  nasal spray Place 1 spray into both nostrils daily as needed for allergies.      furosemide (LASIX) 20 MG tablet TAKE 1 TABLET EVERY DAY 90 tablet 3   insulin glargine (LANTUS SOLOSTAR) 100 UNIT/ML Solostar Pen Inject 48 Units into the skin daily.     isosorbide mononitrate (IMDUR) 30 MG 24 hr tablet Take 3 tablets (90 mg total) by mouth daily. 30 tablet 0   metFORMIN (GLUCOPHAGE) 1000 MG tablet Take 1 tablet (1,000 mg total) by mouth 2 (two) times daily with a meal. 180 tablet 2   metoprolol tartrate (LOPRESSOR) 25 MG tablet TAKE 1/2 TABLET TWICE DAILY 90 tablet 3   nitroGLYCERIN (NITROSTAT) 0.4 MG SL tablet Place 1 tablet (0.4 mg total) under the tongue every 5 (five) minutes as needed for chest pain. 25 tablet 3   NOVOLOG FLEXPEN 100 UNIT/ML FlexPen Inject 16 Units into the skin every evening.     rosuvastatin (CRESTOR) 40 MG tablet TAKE 1 TABLET EVERY DAY 90 tablet 3   TRUE METRIX BLOOD GLUCOSE TEST test strip CHECK BLOOD SUGAR TWICE DAILY 200 strip 3   TRUEplus Lancets 30G MISC Check blood sugar twice  daily and as directed. Dx E11.65 200 each 3   No current facility-administered medications on file prior to visit.     ROS see history of present illness  Objective  Physical Exam Vitals:   04/12/23 1335  BP: 136/78  Pulse: 65  Temp: 97.6 F (36.4 C)  SpO2: 99%    BP Readings from Last 3 Encounters:  04/12/23 136/78  03/29/23 (!) 110/52  03/23/23 136/67   Wt Readings from Last 3 Encounters:  04/12/23 237 lb 12.8 oz (107.9 kg)  03/29/23 239 lb (108.4 kg)  03/22/23 245 lb (111.1 kg)    Physical Exam Musculoskeletal:     Comments: Right knee with mild swelling, no areas of specific tenderness, negative McMurray's      Assessment/Plan: Please see individual problem list.  Acute pain of right knee Assessment & Plan: Discussed with patient that he could have a broken bone or this could be a soft tissue injury.  Will get an x-ray today.  Patient declines pain medication  at this time.  He can continue over-the-counter Tylenol.  Discussed if he needed pain medicine he should let us know.  Discussed we are limited in what we can give him given his medical history and other medications.  Orders: -     DG Knee Complete 4 Views Right; Future     Return if symptoms worsen or fail to improve.   Marikay Alar, MD [facility]

## 2023-04-12 NOTE — Assessment & Plan Note (Signed)
Discussed with patient that he could have a broken bone or this could be a soft tissue injury.  Will get an x-ray today.  Patient declines pain medication at this time.  He can continue over-the-counter Tylenol.  Discussed if he needed pain medicine he should let us know.  Discussed we are limited in what we can give him given his medical history and other medications.

## 2023-04-16 ENCOUNTER — Ambulatory Visit (INDEPENDENT_AMBULATORY_CARE_PROVIDER_SITE_OTHER): Payer: Medicare HMO | Admitting: Family Medicine

## 2023-04-16 ENCOUNTER — Encounter: Payer: Self-pay | Admitting: Family Medicine

## 2023-04-16 VITALS — BP 134/76 | HR 74 | Temp 98.6°F | Ht 71.0 in | Wt 244.1 lb

## 2023-04-16 DIAGNOSIS — E1121 Type 2 diabetes mellitus with diabetic nephropathy: Secondary | ICD-10-CM

## 2023-04-16 DIAGNOSIS — Z794 Long term (current) use of insulin: Secondary | ICD-10-CM | POA: Diagnosis not present

## 2023-04-16 DIAGNOSIS — I10 Essential (primary) hypertension: Secondary | ICD-10-CM | POA: Diagnosis not present

## 2023-04-16 DIAGNOSIS — D62 Acute posthemorrhagic anemia: Secondary | ICD-10-CM

## 2023-04-16 DIAGNOSIS — Z87828 Personal history of other (healed) physical injury and trauma: Secondary | ICD-10-CM

## 2023-04-16 DIAGNOSIS — M25561 Pain in right knee: Secondary | ICD-10-CM | POA: Diagnosis not present

## 2023-04-16 DIAGNOSIS — S3991XS Unspecified injury of abdomen, sequela: Secondary | ICD-10-CM

## 2023-04-16 DIAGNOSIS — N1831 Chronic kidney disease, stage 3a: Secondary | ICD-10-CM | POA: Diagnosis not present

## 2023-04-16 LAB — CBC WITH DIFFERENTIAL/PLATELET
Basophils Absolute: 0 10*3/uL (ref 0.0–0.1)
Basophils Relative: 0.4 % (ref 0.0–3.0)
Eosinophils Absolute: 0.2 10*3/uL (ref 0.0–0.7)
Eosinophils Relative: 2.9 % (ref 0.0–5.0)
HCT: 34.4 % — ABNORMAL LOW (ref 39.0–52.0)
Hemoglobin: 11.2 g/dL — ABNORMAL LOW (ref 13.0–17.0)
Lymphocytes Relative: 16.6 % (ref 12.0–46.0)
Lymphs Abs: 1.2 10*3/uL (ref 0.7–4.0)
MCHC: 32.4 g/dL (ref 30.0–36.0)
MCV: 91.2 fL (ref 78.0–100.0)
Monocytes Absolute: 0.9 10*3/uL (ref 0.1–1.0)
Monocytes Relative: 13 % — ABNORMAL HIGH (ref 3.0–12.0)
Neutro Abs: 4.7 10*3/uL (ref 1.4–7.7)
Neutrophils Relative %: 67.1 % (ref 43.0–77.0)
Platelets: 252 10*3/uL (ref 150.0–400.0)
RBC: 3.77 Mil/uL — ABNORMAL LOW (ref 4.22–5.81)
RDW: 16 % — ABNORMAL HIGH (ref 11.5–15.5)
WBC: 7 10*3/uL (ref 4.0–10.5)

## 2023-04-16 LAB — BASIC METABOLIC PANEL
BUN: 17 mg/dL (ref 6–23)
CO2: 28 meq/L (ref 19–32)
Calcium: 8.7 mg/dL (ref 8.4–10.5)
Chloride: 106 meq/L (ref 96–112)
Creatinine, Ser: 1.06 mg/dL (ref 0.40–1.50)
GFR: 63.26 mL/min (ref >60.00)
Glucose, Bld: 212 mg/dL — ABNORMAL HIGH (ref 70–99)
Potassium: 4.3 meq/L (ref 3.5–5.1)
Sodium: 142 meq/L (ref 135–145)

## 2023-04-16 LAB — IRON: Iron: 39 ug/dL — ABNORMAL LOW (ref 42–165)

## 2023-04-16 NOTE — Assessment & Plan Note (Signed)
Bmet today  Last GFR 53.4  Taking lasix  Encouraged fluids as allowed by cardiology

## 2023-04-16 NOTE — Patient Instructions (Signed)
Take care of yourself  Labs for anemia today and kidney function   Continue current medicines Blood pressure is ok today

## 2023-04-16 NOTE — Progress Notes (Signed)
Subjective:    Patient ID: Ryan Garcia, male    DOB: 1933-12-15, 89 y.o.   MRN: [id number]  HPI  Wt Readings from Last 3 Encounters:  04/16/23 244 lb 2 oz (110.7 kg)  04/12/23 237 lb 12.8 oz (107.9 kg)  03/29/23 239 lb (108.4 kg)   34.05 kg/m  Vitals:   04/16/23 0931  BP: 134/76  Pulse: 74  Temp: 98.6 F (37 C)  SpO2: 97%   Pt presents for follow up of HTN in setting of DM, lipids / heart disease and other chronic problems   Had a fall and knee injury  Nothing was broken  Saw Dr Birdie Sons Tylenol prn   Ribs are no longer bothering him  HTN bp is stable today  No cp or palpitations or headaches or edema  No side effects to medicines  BP Readings from Last 3 Encounters:  04/16/23 134/76  04/12/23 136/78  03/29/23 (!) 110/52    Amlodipine 5 mg daily -was added by endocrinology for elevated blood pressure at last visit  Lasix 20 mg daily  Metoprolol 12.5 mg bid  Isosorbide 90 mg daily    Pulse Readings from Last 3 Encounters:  04/16/23 74  04/12/23 65  03/29/23 70    A fib Eliquis Plavix  Lab Results  Component Value Date   NA 138 03/29/2023   K 4.5 03/29/2023   CO2 27 03/29/2023   GLUCOSE 288 (H) 03/29/2023   BUN 18 03/29/2023   CREATININE 1.22 03/29/2023   CALCIUM 8.6 03/29/2023   GFR 53.46 (L) 03/29/2023   EGFR 57 (L) 12/06/2021   GFRNONAA >60 03/23/2023  CKD  GFR last 53.4   Renal cyst with hemorrhage in past Also abd trauma and rib fracture-discussed at last visit  Lab Results  Component Value Date   WBC 6.4 03/29/2023   HGB 10.5 (L) 03/29/2023   HCT 32.4 (L) 03/29/2023   MCV 93.5 03/29/2023   PLT 294.0 03/29/2023   Has a urology appointment tomorrow   Lab Results  Component Value Date   IRON 44 03/29/2023   TIBC 447 07/31/2022   FERRITIN 6 (L) 07/31/2022     Hyperlipidemia Lab Results  Component Value Date   CHOL 86 07/31/2022   HDL 29 (L) 07/31/2022   LDLCALC 38 07/31/2022   LDLDIRECT 78.5 11/17/2012   TRIG 95  07/31/2022   CHOLHDL 3.0 07/31/2022   Crestor 40 mg daily from cardiology   DM2 with neuropathy and CKD  Sees endocrinology  Lab Results  Component Value Date   HGBA1C 7.7 (H) 03/23/2023     Metformin 1000 mg bid  Lantus 48 u daily  Novolog 16 u every evening and 8 u before am meal   Hypothyroid subclinical   Lab Results  Component Value Date   TSH 7.730 (H) 08/17/2022      Patient Active Problem List   Diagnosis Date Noted   Acute pain of right knee 04/12/2023   Abdominal trauma 03/22/2023   Renal cyst, native, hemorrhage 03/22/2023   HLD (hyperlipidemia) 03/22/2023   Chronic kidney disease, stage 3a (HCC) 03/22/2023   Type II diabetes mellitus with renal manifestations (HCC) 03/22/2023   Left rib fracture 03/22/2023   Acute blood loss anemia 03/22/2023   Nonspecific chest pain 12/21/2022   Diarrhea 12/21/2022   Pallor 09/12/2022   Frequent urination 09/12/2022   Light headed 09/12/2022   Transaminasemia 08/22/2022   STEMI involving right coronary artery (HCC) 08/17/2022   Electrolyte abnormality 08/17/2022  Paroxysmal atrial flutter (HCC) 08/17/2022   Ascending colon malignant neoplasm (HCC) 08/01/2022   Benign neoplasm of transverse colon 08/01/2022   Benign neoplasm of sigmoid colon 08/01/2022   Benign neoplasm of rectum 08/01/2022   Duodenitis 08/01/2022   Right shoulder pain 11/21/2021   Carpal tunnel syndrome 11/21/2021   Atrial flutter with rapid ventricular response (HCC)    Rapid atrial fibrillation (HCC) 10/11/2020   Poor balance 07/26/2020   Anemia 04/20/2020   Right renal mass 04/18/2020   AKI (acute kidney injury) (HCC) 04/14/2020   Cholangitis 04/14/2020   Coronary artery disease involving native coronary artery of native heart without angina pectoris 01/18/2019   Medicare annual wellness visit, subsequent 01/16/2019   Obesity (BMI 30-39.9) 07/09/2018   HOH (hard of hearing) 03/31/2018   Uncontrolled type 2 diabetes mellitus with  hyperglycemia (HCC) 10/28/2017   Subclinical hypothyroidism 10/28/2017   Scoliosis    Obstructive sleep apnea on CPAP    Hx of skin cancer, basal cell    Hyperlipidemia associated with type 2 diabetes mellitus (HCC)    GERD (gastroesophageal reflux disease)    Chronic upper back pain    Arthritis    Allergy    NSTEMI (non-ST elevated myocardial infarction) (HCC) 06/18/2017   Actinic keratoses 04/29/2017   Positive colorectal cancer screening using Cologuard test 10/31/2016   BPH (benign prostatic hyperplasia) 09/04/2016   B12 deficiency 11/22/2015   Routine general medical examination at a health care facility 08/26/2015   Abnormal nuclear cardiac imaging test    Dyspnea on exertion 11/27/2013   Encounter for examination of normal volunteer in research study 05/29/2013   Prostate cancer screening 07/15/2012   CAD S/P percutaneous coronary angioplasty 05/17/2011   Nonspecific abnormal results of cardiovascular function study 04/30/2011   Abnormal EKG 04/06/2011   Right low back pain 01/13/2010   Insulin dependent diabetes mellitus 10/04/2006   ERECTILE DYSFUNCTION 10/04/2006   Primary hypertension 10/04/2006   Allergic rhinitis 10/04/2006   Osteoarthritis 10/04/2006   Sleep apnea 10/04/2006   EDEMA 10/04/2006   SKIN CANCER, HX OF 10/04/2006   Past Medical History:  Diagnosis Date   AC (acromioclavicular) joint bone spurs    lt ankle   Allergy    allergic rhinitis   Anginal pain (HCC)    Arthritis    OA   BPH (benign prostatic hyperplasia)    microwave tx of prostate   CAD (coronary artery disease)    a. s/p cath in 2012 showing 70-75% LAD stenosis, 75% D1 stenosis, 75% OM1, 60-70% RCA stenosis, and 80% PDA --> medical therapy pursued b. similar findings by cath in 2016; 06/2017 DES to distal RCA   Chronic kidney disease    Chronic upper back pain    Diabetes mellitus    type II x8 years   GERD (gastroesophageal reflux disease)    HLD (hyperlipidemia)    10/12: TC  208, TG 213, HDL 36, LDL 129   Hx of skin cancer, basal cell    many skin cancers removed/under constant treatment.   Hypertension    NSTEMI (non-ST elevated myocardial infarction) (HCC) 06/18/2017   DES to distal RCA   Obstructive sleep apnea on CPAP    Paroxysmal atrial flutter (HCC)    Renal mass    Scoliosis    Past Surgical History:  Procedure Laterality Date   BIOPSY  08/01/2022   Procedure: BIOPSY;  Surgeon: Beverley Fiedler, MD;  Location: [facility] ENDOSCOPY;  Service: Gastroenterology;;   BREAST SURGERY  2009  breast lump removed benign   BUBBLE STUDY  10/14/2020   Procedure: BUBBLE STUDY;  Surgeon: Parke Poisson, MD;  Location: [facility] ENDOSCOPY;  Service: Cardiovascular;;   CARDIAC CATHETERIZATION N/A 03/15/2015   Procedure: Left Heart Cath and Coronary Angiography;  Surgeon: Lyn Records, MD;  Location: [facility] INVASIVE CV LAB;  Service: Cardiovascular;  Laterality: N/A;   CARDIOVERSION N/A 10/14/2020   Procedure: CARDIOVERSION;  Surgeon: Parke Poisson, MD;  Location: [facility] ENDOSCOPY;  Service: Cardiovascular;  Laterality: N/A;   CATARACT EXTRACTION W/PHACO Right 11/29/2021   Procedure: CATARACT EXTRACTION PHACO AND INTRAOCULAR LENS PLACEMENT (IOC) RIGHT DIABETIC;  Surgeon: Lockie Mola, MD;  Location: [facility]ERY [facility];  Service: Ophthalmology;  Laterality: Right;  Diabetic 18.72 01:54.3   COLONOSCOPY N/A 08/01/2022   Procedure: COLONOSCOPY;  Surgeon: Beverley Fiedler, MD;  Location: [facility] ENDOSCOPY;  Service: Gastroenterology;  Laterality: N/A;   CORONARY STENT INTERVENTION N/A 06/18/2017   Procedure: CORONARY STENT INTERVENTION;  Surgeon: Corky Crafts, MD;  Location[facility]AS[facility] CV LAB;  Service: Cardiovascular;  Laterality: N/A;  RCA   CORONARY/GRAFT ACUTE MI REVASCULARIZATION N/A 08/17/2022   Procedure: Coronary/Graft Acute MI Revascularization;  Surgeon: Alwyn Pea, MD;  Location: [facility] INVASIVE CV LAB;  Service: Cardiovascular;  Laterality: N/A;    ESOPHAGOGASTRODUODENOSCOPY (EGD) WITH PROPOFOL N/A 08/01/2022   Procedure: ESOPHAGOGASTRODUODENOSCOPY (EGD) WITH PROPOFOL;  Surgeon: Beverley Fiedler, MD;  Location: [facility] ENDOSCOPY;  Service: Gastroenterology;  Laterality: N/A;   HEMOSTASIS CLIP PLACEMENT  08/01/2022   Procedure: HEMOSTASIS CLIP PLACEMENT;  Surgeon: Beverley Fiedler, MD;  Location: [facility] ENDOSCOPY;  Service: Gastroenterology;;   LAPAROSCOPIC LYSIS OF ADHESIONS N/A 08/03/2022   Procedure: LAPAROSCOPIC LYSIS OF ADHESIONS;  Surgeon: Griselda Miner, MD;  Location: [facility] OR;  Service: General;  Laterality: N/A;   LAPAROSCOPIC PARTIAL COLECTOMY N/A 08/03/2022   Procedure: OPEN PARTIAL COLECTOMY;  Surgeon: Griselda Miner, MD;  Location: [facility] OR;  Service: General;  Laterality: N/A;   LEFT HEART CATH AND CORONARY ANGIOGRAPHY N/A 06/18/2017   Procedure: LEFT HEART CATH AND CORONARY ANGIOGRAPHY;  Surgeon: Corky Crafts, MD;  Location: [facility] INVASIVE CV LAB;  Service: Cardiovascular;  Laterality: N/A;   LEFT HEART CATH AND CORONARY ANGIOGRAPHY N/A 04/12/2020   Procedure: LEFT HEART CATH AND CORONARY ANGIOGRAPHY;  Surgeon: Swazil[place], Peter M, MD;  Location: [facility] INVASIVE CV LAB;  Service: Cardiovascular;  Laterality: N/A;   LEFT HEART CATH AND CORONARY ANGIOGRAPHY N/A 08/17/2022   Procedure: LEFT HEART CATH AND CORONARY ANGIOGRAPHY;  Surgeon: Alwyn Pea, MD;  Location: [facility] INVASIVE CV LAB;  Service: Cardiovascular;  Laterality: N/A;   POLYPECTOMY  08/01/2022   Procedure: POLYPECTOMY;  Surgeon: Beverley Fiedler, MD;  Location: [facility] ENDOSCOPY;  Service: Gastroenterology;;   SUBMUCOSAL TATTOO INJECTION  08/01/2022   Procedure: SUBMUCOSAL TATTOO INJECTION;  Surgeon: Beverley Fiedler, MD;  Location: [facility] ENDOSCOPY;  Service: Gastroenterology;;   TEE WITHOUT CARDIOVERSION N/A 10/14/2020   Procedure: TRANSESOPHAGEAL ECHOCARDIOGRAM (TEE);  Surgeon: Parke Poisson, MD;  Location: [facility] ENDOSCOPY;  Service: Cardiovascular;  Laterality: N/A;   ULTRASOUND GUIDANCE FOR VASCULAR  ACCESS  06/18/2017   Procedure: Ultrasound Guidance For Vascular Access;  Surgeon: Corky Crafts, MD;  Location: [facility] INVASIVE CV LAB;  Service: Cardiovascular;;  right radial, right femoral   Social History   Tobacco Use   Smoking status: Never    Passive exposure: Never   Smokeless tobacco: Never  Vaping Use   Vaping status: Never Used  Substance Use Topics  Alcohol use: No    Alcohol/week: 0.0 standard drinks of alcohol   Drug use: No   Family History  Problem Relation Age of Onset   Heart attack Brother    Colon cancer Neg Hx    Colon polyps Neg Hx    Stomach cancer Neg Hx    Esophageal cancer Neg Hx    Allergies  Allergen Reactions   Loratadine Other (See Comments)    Not effective   Simvastatin Other (See Comments)    Intolerant- caused joint pain   Current Outpatient Medications on File Prior to Visit  Medication Sig Dispense Refill   acetaminophen (TYLENOL) 325 MG tablet Take 325-650 mg by mouth every 6 (six) hours as needed for mild pain or headache.     Alcohol Swabs (B-D SINGLE USE SWABS REGULAR) PADS Use to check blood sugar 2 times a day 200 each 3   amLODipine (NORVASC) 5 MG tablet Take 5 mg by mouth daily.     apixaban (ELIQUIS) 5 MG TABS tablet TAKE 1 TABLET TWICE DAILY 180 tablet 1   Blood Glucose Calibration (TRUE METRIX LEVEL 1) Low SOLN Use to check control on glucose meter 1 each 3   Blood Glucose Monitoring Suppl (TRUE METRIX AIR GLUCOSE METER) w/Device KIT 1 kit by Other route 2 (two) times daily. Check blood sugar twice daily and as directed. Dx E11.65 1 kit 0   cetirizine (ZYRTEC) 10 MG tablet Take 10 mg by mouth daily.     clopidogrel (PLAVIX) 75 MG tablet Take 1 tablet (75 mg total) by mouth daily. 90 tablet 3   Coenzyme Q10 (CO Q 10 PO) Take 200 mg by mouth daily.     Continuous Glucose Receiver (FREESTYLE LIBRE 3 READER) DEVI      Continuous Glucose Sensor (FREESTYLE LIBRE 3 SENSOR) MISC USE 1 SENSOR EVERY 14 DAYS     Cyanocobalamin  (VITAMIN B12) 1000 MCG TBCR Take 1,000 mcg by mouth daily. 30 tablet    DROPLET PEN NEEDLES 31G X 6 MM MISC USE ONE TIME DAILY  AT  BEDTIME  WITH  LANTUS 100 each 3   furosemide (LASIX) 20 MG tablet TAKE 1 TABLET EVERY DAY 90 tablet 3   insulin glargine (LANTUS SOLOSTAR) 100 UNIT/ML Solostar Pen Inject 38 Units into the skin daily.     isosorbide mononitrate (IMDUR) 30 MG 24 hr tablet Take 3 tablets (90 mg total) by mouth daily. 30 tablet 0   metFORMIN (GLUCOPHAGE) 1000 MG tablet Take 1 tablet (1,000 mg total) by mouth 2 (two) times daily with a meal. 180 tablet 2   metoprolol tartrate (LOPRESSOR) 25 MG tablet TAKE 1/2 TABLET TWICE DAILY 90 tablet 3   Multiple Vitamins-Minerals (ICAPS AREDS 2 PO) Take 1 Capful by mouth daily.     nitroGLYCERIN (NITROSTAT) 0.4 MG SL tablet Place 1 tablet (0.4 mg total) under the tongue every 5 (five) minutes as needed for chest pain. 25 tablet 3   NOVOLOG FLEXPEN 100 UNIT/ML FlexPen Inject into the skin every evening. 8 units in am 16 units in pm     rosuvastatin (CRESTOR) 40 MG tablet TAKE 1 TABLET EVERY DAY 90 tablet 3   TRUE METRIX BLOOD GLUCOSE TEST test strip CHECK BLOOD SUGAR TWICE DAILY 200 strip 3   TRUEplus Lancets 30G MISC Check blood sugar twice daily and as directed. Dx E11.65 200 each 3   No current facility-administered medications on file prior to visit.    Review of Systems  Constitutional:  Negative for activity change, appetite change, fatigue, fever and unexpected weight change.  HENT:  Positive for hearing loss. Negative for congestion, rhinorrhea, sore throat and trouble swallowing.   Eyes:  Negative for pain, redness, itching and visual disturbance.  Respiratory:  Negative for cough, chest tightness, shortness of breath and wheezing.        No longer has rib pain   Cardiovascular:  Negative for chest pain and palpitations.  Gastrointestinal:  Negative for abdominal pain, blood in stool, constipation, diarrhea and nausea.  Endocrine:  Negative for cold intolerance, heat intolerance, polydipsia and polyuria.  Genitourinary:  Negative for difficulty urinating, dysuria, frequency and urgency.  Musculoskeletal:  Negative for arthralgias, joint swelling and myalgias.  Skin:  Negative for pallor and rash.  Neurological:  Negative for dizziness, tremors, weakness, numbness and headaches.       Fair balance One fall    Hematological:  Negative for adenopathy. Does not bruise/bleed easily.  Psychiatric/Behavioral:  Negative for decreased concentration and dysphoric mood. The patient is not nervous/anxious.        Objective:   Physical Exam Constitutional:      General: He is not in acute distress.    Appearance: Normal appearance. He is well-developed. He is obese. He is not ill-appearing or diaphoretic.  HENT:     Head: Normocephalic and atraumatic.  Eyes:     Conjunctiva/sclera: Conjunctivae normal.     Pupils: Pupils are equal, round, and reactive to light.  Neck:     Thyroid: No thyromegaly.     Vascular: No carotid bruit or JVD.  Cardiovascular:     Rate and Rhythm: Normal rate and regular rhythm.     Heart sounds: Normal heart sounds.     No gallop.  Pulmonary:     Effort: Pulmonary effort is normal. No respiratory distress.     Breath sounds: Normal breath sounds. No wheezing or rales.  Abdominal:     General: There is no distension or abdominal bruit.     Palpations: Abdomen is soft.  Musculoskeletal:     Cervical back: Normal range of motion and neck supple.     Right lower leg: Edema present.     Left lower leg: Edema present.  Lymphadenopathy:     Cervical: No cervical adenopathy.  Skin:    General: Skin is warm and dry.     Coloration: Skin is not pale.     Findings: No rash.  Neurological:     Mental Status: He is alert.     Coordination: Coordination normal.     Deep Tendon Reflexes: Reflexes are normal and symmetric. Reflexes normal.  Psychiatric:        Mood and Affect: Mood normal.            Assessment & Plan:   Problem List Items Addressed This Visit       Cardiovascular and Mediastinum   Primary hypertension    Endocrinologist added amlodipine 5 mg daily   bp in fair control at this time  BP Readings from Last 1 Encounters:  04/16/23 134/76   No changes needed Most recent labs reviewed  Disc lifstyle change with low sodium diet and exercise  Continues lasix 20 mg daily Metoprolol 12.5 mg bid Isosorbide 90 mg daily   Some ankle edema        Relevant Medications   amLODipine (NORVASC) 5 MG tablet     Endocrine   Type II diabetes mellitus with renal manifestations (HCC)  Seening encocrinology  Continues metformin and lantus and novolog (added pre bkfast dose) Discussed protein sources today        Genitourinary   Chronic kidney disease, stage 3a (HCC)    Bmet today  Last GFR 53.4  Taking lasix  Encouraged fluids as allowed by cardiology       Relevant Orders   Basic metabolic panel     Other   Abdominal trauma    No longer has rib or abd discomfort       Acute blood loss anemia - Primary    In setting of hemorrhagic renal cyst  Hb improved last time from hospital Feeling better  Cbc and iron today  Has urology follow up tomorrow       Relevant Orders   CBC with Differential/Platelet   Iron   Acute pain of right knee    Xray was reassuring Reviewed notes from Dr Birdie Sons   Pt has noted clinical improvement Encouraged to use ice  Use walker if unsteady Discussed fall prevention

## 2023-04-16 NOTE — Assessment & Plan Note (Signed)
In setting of hemorrhagic renal cyst  Hb improved last time from hospital Feeling better  Cbc and iron today  Has urology follow up tomorrow

## 2023-04-16 NOTE — Assessment & Plan Note (Signed)
Xray was reassuring Reviewed notes from Dr Birdie Sons   Pt has noted clinical improvement Encouraged to use ice  Use walker if unsteady Discussed fall prevention

## 2023-04-16 NOTE — Assessment & Plan Note (Signed)
No longer has rib or abd discomfort

## 2023-04-16 NOTE — Assessment & Plan Note (Signed)
Endocrinologist added amlodipine 5 mg daily   bp in fair control at this time  BP Readings from Last 1 Encounters:  04/16/23 134/76   No changes needed Most recent labs reviewed  Disc lifstyle change with low sodium diet and exercise  Continues lasix 20 mg daily Metoprolol 12.5 mg bid Isosorbide 90 mg daily   Some ankle edema

## 2023-04-16 NOTE — Assessment & Plan Note (Signed)
Seening encocrinology  Continues metformin and lantus and novolog (added pre bkfast dose) Discussed protein sources today

## 2023-04-17 ENCOUNTER — Ambulatory Visit: Payer: Medicare HMO | Admitting: Urology

## 2023-04-17 ENCOUNTER — Encounter: Payer: Self-pay | Admitting: Urology

## 2023-04-17 VITALS — BP 151/71 | HR 67 | Ht 71.0 in | Wt 241.0 lb

## 2023-04-17 DIAGNOSIS — N2889 Other specified disorders of kidney and ureter: Secondary | ICD-10-CM | POA: Diagnosis not present

## 2023-04-17 DIAGNOSIS — R399 Unspecified symptoms and signs involving the genitourinary system: Secondary | ICD-10-CM | POA: Diagnosis not present
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
## 2023-04-19 ENCOUNTER — Ambulatory Visit: Payer: Medicare HMO | Admitting: Physician Assistant

## 2023-04-23 ENCOUNTER — Ambulatory Visit (INDEPENDENT_AMBULATORY_CARE_PROVIDER_SITE_OTHER): Payer: Medicare HMO

## 2023-04-23 DIAGNOSIS — Z23 Encounter for immunization: Secondary | ICD-10-CM | POA: Diagnosis not present

## 2023-04-23 NOTE — Telephone Encounter (Signed)
Patient returned call to office regarding lab results.I relayed the reassuring news to him.He verbalized understanding.

## 2023-04-29 ENCOUNTER — Other Ambulatory Visit: Payer: Self-pay

## 2023-04-29 ENCOUNTER — Emergency Department: Payer: Medicare HMO

## 2023-04-29 ENCOUNTER — Emergency Department
Admission: EM | Admit: 2023-04-29 | Discharge: 2023-04-29 | Disposition: A | Discharge status: Home / Self Care | Payer: Medicare HMO | Attending: Emergency Medicine | Admitting: Emergency Medicine

## 2023-04-29 DIAGNOSIS — R0789 Other chest pain: Secondary | ICD-10-CM | POA: Diagnosis not present

## 2023-04-29 DIAGNOSIS — I10 Essential (primary) hypertension: Secondary | ICD-10-CM | POA: Diagnosis not present

## 2023-04-29 DIAGNOSIS — Z7901 Long term (current) use of anticoagulants: Secondary | ICD-10-CM | POA: Diagnosis not present

## 2023-04-29 DIAGNOSIS — I499 Cardiac arrhythmia, unspecified: Secondary | ICD-10-CM | POA: Diagnosis not present

## 2023-04-29 DIAGNOSIS — E119 Type 2 diabetes mellitus without complications: Secondary | ICD-10-CM | POA: Diagnosis not present

## 2023-04-29 DIAGNOSIS — R079 Chest pain, unspecified: Secondary | ICD-10-CM | POA: Diagnosis not present

## 2023-04-29 DIAGNOSIS — Z951 Presence of aortocoronary bypass graft: Secondary | ICD-10-CM | POA: Insufficient documentation

## 2023-04-29 DIAGNOSIS — I2510 Atherosclerotic heart disease of native coronary artery without angina pectoris: Secondary | ICD-10-CM | POA: Insufficient documentation

## 2023-04-29 DIAGNOSIS — I959 Hypotension, unspecified: Secondary | ICD-10-CM | POA: Diagnosis not present

## 2023-04-29 DIAGNOSIS — R110 Nausea: Secondary | ICD-10-CM | POA: Diagnosis not present

## 2023-04-29 LAB — COMPREHENSIVE METABOLIC PANEL
ALT: 23 U/L (ref 0–44)
AST: 31 U/L (ref 15–41)
Albumin: 3.2 g/dL — ABNORMAL LOW (ref 3.5–5.0)
Alkaline Phosphatase: 101 U/L (ref 38–126)
Anion gap: 8 (ref 5–15)
BUN: 21 mg/dL (ref 8–23)
CO2: 25 mmol/L (ref 22–32)
Calcium: 8.5 mg/dL — ABNORMAL LOW (ref 8.9–10.3)
Chloride: 105 mmol/L (ref 98–111)
Creatinine, Ser: 1.19 mg/dL (ref 0.61–1.24)
GFR, Estimated: 59 mL/min — ABNORMAL LOW (ref >60)
Glucose, Bld: 135 mg/dL — ABNORMAL HIGH (ref 70–99)
Potassium: 4 mmol/L (ref 3.5–5.1)
Sodium: 138 mmol/L (ref 135–145)
Total Bilirubin: 0.8 mg/dL (ref 0.3–1.2)
Total Protein: 6.8 g/dL (ref 6.5–8.1)

## 2023-04-29 LAB — CBC WITH DIFFERENTIAL/PLATELET
Abs Immature Granulocytes: 0.02 10*3/uL (ref 0.00–0.07)
Basophils Absolute: 0 10*3/uL (ref 0.0–0.1)
Basophils Relative: 0 %
Eosinophils Absolute: 0.2 10*3/uL (ref 0.0–0.5)
Eosinophils Relative: 3 %
HCT: 34.3 % — ABNORMAL LOW (ref 39.0–52.0)
Hemoglobin: 10.7 g/dL — ABNORMAL LOW (ref 13.0–17.0)
Immature Granulocytes: 0 %
Lymphocytes Relative: 13 %
Lymphs Abs: 1 10*3/uL (ref 0.7–4.0)
MCH: 28.5 pg (ref 26.0–34.0)
MCHC: 31.2 g/dL (ref 30.0–36.0)
MCV: 91.5 fL (ref 80.0–100.0)
Monocytes Absolute: 0.9 10*3/uL (ref 0.1–1.0)
Monocytes Relative: 12 %
Neutro Abs: 5.4 10*3/uL (ref 1.7–7.7)
Neutrophils Relative %: 72 %
Platelets: 256 10*3/uL (ref 150–400)
RBC: 3.75 MIL/uL — ABNORMAL LOW (ref 4.22–5.81)
RDW: 15.1 % (ref 11.5–15.5)
WBC: 7.6 10*3/uL (ref 4.0–10.5)
nRBC: 0 % (ref 0.0–0.2)

## 2023-04-29 LAB — TROPONIN I (HIGH SENSITIVITY)
Troponin I (High Sensitivity): 9 ng/L (ref <18)
Troponin I (High Sensitivity): 9 ng/L (ref <18)

## 2023-04-29 MED ORDER — ASPIRIN 81 MG PO CHEW
324.0000 mg | CHEWABLE_TABLET | Freq: Once | ORAL | Status: AC
Start: 2023-04-29 — End: 2023-04-29
  Administered 2023-04-29: 324 mg via ORAL
  Filled 2023-04-29: qty 4

## 2023-04-29 NOTE — ED Triage Notes (Signed)
Pt to ED via [facility], pt c/o chest pain and nausea that woke him from his sleep this morning. Pt took 2 nitroglycerin at home and had relief, denies pain at this time. hx 9 blockages, no stent placement.

## 2023-04-29 NOTE — ED Provider Notes (Signed)
[facility] Provider Note    Event Date/Time   First MD Initiated Contact with Patient 04/29/23 [id number]     (approximate)   History   Chest Pain   HPI  Ryan Garcia is a 89 y.o. male who presents to the ED for evaluation of Chest Pain   I reviewed cardiology clinic visit from July.  History of CAD with multiple stents, paroxysmal A-fib on Eliquis, HTN, HLD, DM, OSA. Three-vessel disease and reportedly repeatedly declined CABGs in the past.  Patient presents from home via EMS for evaluation of resolving chest discomfort.  Reports being in his normal state of health until awakening from sleep at 1:30 AM with severe chest pain, resolving upon nitroglycerin.  Pain resolved soon as EMS stretcher left his house and he breathed in some cold air.  Presents to the ED pain-free and "feeling 100% normal.  "   Physical Exam   Triage Vital Signs: ED Triage Vitals  Encounter Vitals Group     BP 04/29/23 0240 135/73     Systolic BP Percentile --      Diastolic BP Percentile --      Pulse Rate 04/29/23 0240 81     Resp 04/29/23 0240 18     Temp 04/29/23 0240 98.2 F (36.8 C)     Temp Source 04/29/23 0240 Oral     SpO2 04/29/23 0240 100 %     Weight 04/29/23 0239 240 lb (108.9 kg)     Height 04/29/23 0239 5\' 11"  (1.803 m)     Head Circumference --      Peak Flow --      Pain Score 04/29/23 0239 0     Pain Loc --      Pain Education --      Exclude from Growth Chart --     Most recent vital signs: Vitals:   04/29/23 0500 04/29/23 0600  BP: 133/79 133/73  Pulse: 71 75  Resp: 15 20  Temp:    SpO2: 96% 95%    General: Awake, no distress.  CV:  Good peripheral perfusion.  Resp:  Normal effort.  Abd:  No distention.  MSK:  No deformity noted.  Neuro:  No focal deficits appreciated. Other:     ED Results / Procedures / Treatments   Labs (all labs ordered are listed, but only abnormal results are displayed) Labs Reviewed  COMPREHENSIVE METABOLIC  PANEL - Abnormal; Notable for the following components:      Result Value   Glucose, Bld 135 (*)    Calcium 8.5 (*)    Albumin 3.2 (*)    GFR, Estimated 59 (*)    All other components within normal limits  CBC WITH DIFFERENTIAL/PLATELET - Abnormal; Notable for the following components:   RBC 3.75 (*)    Hemoglobin 10.7 (*)    HCT 34.3 (*)    All other components within normal limits  TROPONIN I (HIGH SENSITIVITY)  TROPONIN I (HIGH SENSITIVITY)    EKG A-fib with a rate of 64 bpm.  Normal axis and intervals.  Nonspecific ST changes to inferior leads without STEMI.  RADIOLOGY CXR interpreted by me without evidence of acute cardiopulmonary pathology.  Official radiology report(s): DG Chest Portable 1 View  Result Date: 04/29/2023 CLINICAL DATA:  Chest pain EXAM: PORTABLE CHEST 1 VIEW COMPARISON:  03/22/2023 FINDINGS: The heart size and mediastinal contours are within normal limits. Both lungs are clear. The visualized skeletal structures are unremarkable. IMPRESSION: No active  disease. Electronically Signed   By: Deatra Robinson M.D.   On: 04/29/2023 03:13    PROCEDURES and INTERVENTIONS:  .1-3 Lead EKG Interpretation  Performed by: Delton Prairie, MD Authorized by: Delton Prairie, MD     Interpretation: normal     ECG rate:  66   ECG rate assessment: normal     Rhythm: atrial fibrillation     Ectopy: none     Conduction: normal     Medications  aspirin chewable tablet 324 mg (324 mg Oral Given 04/29/23 0304)     IMPRESSION / MDM / ASSESSMENT AND PLAN / ED COURSE  I reviewed the triage vital signs and the nursing notes.  Differential diagnosis includes, but is not limited to, ACS, PTX, PNA, muscle strain/spasm, PE, dissection, anxiety, pleural effusion  {Patient presents with symptoms of an acute illness or injury that is potentially life-threatening.  Pleasant 89 year old male with severe multivessel CAD presents after resolved episode of chest discomfort.  Presents  asymptomatic with normal vital signs and nonischemic EKG in A-fib.  No rapid rates.  First troponin is negative.  We will keep him on the monitor and trend troponins and reassess.  Clear CXR, uptrending hemoglobin, normal metabolic panel.  Clinical Course as of 04/29/23 6606  [facility] Apr 29, 2023  0346 Reassessed. Remains pain free. Discussed plan of care. Wife at bedside [DS]  0612 Reassessed, feeling better, no recurrence of pain.  Second troponin is negative.  Discussed plan of care.  He would like to go home and I think this is reasonable but we discussed very close return precautions. [DS]    Clinical Course User Index [DS] Delton Prairie, MD     FINAL CLINICAL IMPRESSION(S) / ED DIAGNOSES   Final diagnoses:  Other chest pain     Rx / DC Orders   ED Discharge Orders     None        Note:  This document was prepared using Dragon voice recognition software and may include unintentional dictation errors.   Delton Prairie, MD 04/29/23 [phone]

## 2023-05-10 ENCOUNTER — Telehealth: Payer: Self-pay | Admitting: Family Medicine

## 2023-05-10 NOTE — Telephone Encounter (Signed)
Form in your inbox 

## 2023-05-10 NOTE — Telephone Encounter (Signed)
Patient spouse left paperwork to be filled out by provider. Please fax to [facility] at [phone] when done. Call patient spouse when ready for pick up.

## 2023-05-13 NOTE — Telephone Encounter (Signed)
Done, will put in IN box

## 2023-05-14 NOTE — Telephone Encounter (Signed)
Form faxed and then sent to scanning. Pt's spouse notified.

## 2023-05-15 DIAGNOSIS — L309 Dermatitis, unspecified: Secondary | ICD-10-CM | POA: Diagnosis not present

## 2023-05-15 DIAGNOSIS — D2272 Melanocytic nevi of left lower limb, including hip: Secondary | ICD-10-CM | POA: Diagnosis not present

## 2023-05-15 DIAGNOSIS — C44212 Basal cell carcinoma of skin of right ear and external auricular canal: Secondary | ICD-10-CM | POA: Diagnosis not present

## 2023-05-15 DIAGNOSIS — Z85828 Personal history of other malignant neoplasm of skin: Secondary | ICD-10-CM | POA: Diagnosis not present

## 2023-05-15 DIAGNOSIS — L821 Other seborrheic keratosis: Secondary | ICD-10-CM | POA: Diagnosis not present

## 2023-05-15 DIAGNOSIS — D2271 Melanocytic nevi of right lower limb, including hip: Secondary | ICD-10-CM | POA: Diagnosis not present

## 2023-05-15 DIAGNOSIS — Z872 Personal history of diseases of the skin and subcutaneous tissue: Secondary | ICD-10-CM | POA: Diagnosis not present

## 2023-05-15 DIAGNOSIS — D2262 Melanocytic nevi of left upper limb, including shoulder: Secondary | ICD-10-CM | POA: Diagnosis not present

## 2023-05-15 DIAGNOSIS — C44329 Squamous cell carcinoma of skin of other parts of face: Secondary | ICD-10-CM | POA: Diagnosis not present

## 2023-05-15 DIAGNOSIS — D225 Melanocytic nevi of trunk: Secondary | ICD-10-CM | POA: Diagnosis not present

## 2023-05-15 DIAGNOSIS — D2261 Melanocytic nevi of right upper limb, including shoulder: Secondary | ICD-10-CM | POA: Diagnosis not present

## 2023-06-10 ENCOUNTER — Telehealth: Payer: Self-pay | Admitting: Family Medicine

## 2023-06-10 NOTE — Telephone Encounter (Signed)
Copied from CRM [id number]. Topic: Medicare AWV >> Jun 10, 2023  2:44 PM Payton Doughty wrote: Reason for CRM: LVM 06/10/23 to r/s AWV.  Please r/s appt w/ [facility]# [id number] spoof #[phone]   Verlee Rossetti; Care Guide Ambulatory Clinical Support Delton l [facility] Direct Dial: [phone]

## 2023-06-13 ENCOUNTER — Telehealth: Payer: Self-pay | Admitting: Family Medicine

## 2023-06-13 DIAGNOSIS — C44329 Squamous cell carcinoma of skin of other parts of face: Secondary | ICD-10-CM | POA: Diagnosis not present

## 2023-06-13 NOTE — Telephone Encounter (Signed)
Agree with ER and UC precautions

## 2023-06-13 NOTE — Telephone Encounter (Signed)
FYI: This call has been transferred to Access Nurse. Once the result note has been entered staff can address the message at that time.  Patient called in with the following symptoms:  Red Word: left hand/arm numbness    Please advise at Mobile [phone]   Message is routed to Provider Pool and [facility] Triage    Pt's wife, Hilda Lias, called stating for the past few nights, the pt has been experiencing numbness in his right hand/arm. Hilda Lias states the issue has been happening for quite awhile, but lately, it keeps the pt up from night & he cannot sleep. Hilda Lias mentioned the pt does have heart issues. No other symptoms. Hilda Lias requested an appt for pt for tomorrow, 12/13, afternoon. Scheduled pt with Dr. Para March @ 3pm. Attempted to transfer Hilda Lias to access nurse, she hung up, while I was on hold. Sending note to [facility] & triage pool

## 2023-06-13 NOTE — Telephone Encounter (Signed)
I spoke with Mrs Ryan Garcia (DPR signed) pt is presently at dermatologist for skin issue.for the past 2 nights pt has had numbness in left (not right) entire arm with numbness, pain and swelling in lt hand. No CP or SOB. Now pain level is 5. Not sure how long going to be at dermatologist today. Pt already has appt with Dr Para March on 06/14/23 and UC & ED precautions given and Ryan Garcia voiced understanding. Sending note to Dr Para March and Lorain Childes to Dr Milinda Antis as PCP.

## 2023-06-13 NOTE — Telephone Encounter (Signed)
Noted. Thanks.

## 2023-06-14 ENCOUNTER — Ambulatory Visit (INDEPENDENT_AMBULATORY_CARE_PROVIDER_SITE_OTHER): Payer: Medicare HMO | Admitting: Family Medicine

## 2023-06-14 ENCOUNTER — Encounter: Payer: Self-pay | Admitting: Family Medicine

## 2023-06-14 ENCOUNTER — Emergency Department: Payer: Medicare HMO

## 2023-06-14 ENCOUNTER — Other Ambulatory Visit: Payer: Self-pay

## 2023-06-14 VITALS — BP 109/62 | HR 77 | Temp 98.7°F | Ht 71.0 in | Wt 234.6 lb

## 2023-06-14 DIAGNOSIS — R079 Chest pain, unspecified: Secondary | ICD-10-CM | POA: Diagnosis not present

## 2023-06-14 DIAGNOSIS — N186 End stage renal disease: Secondary | ICD-10-CM | POA: Insufficient documentation

## 2023-06-14 DIAGNOSIS — I480 Paroxysmal atrial fibrillation: Secondary | ICD-10-CM | POA: Insufficient documentation

## 2023-06-14 DIAGNOSIS — R0789 Other chest pain: Secondary | ICD-10-CM | POA: Insufficient documentation

## 2023-06-14 DIAGNOSIS — Z951 Presence of aortocoronary bypass graft: Secondary | ICD-10-CM | POA: Insufficient documentation

## 2023-06-14 DIAGNOSIS — R739 Hyperglycemia, unspecified: Secondary | ICD-10-CM | POA: Diagnosis not present

## 2023-06-14 DIAGNOSIS — I959 Hypotension, unspecified: Secondary | ICD-10-CM | POA: Diagnosis not present

## 2023-06-14 LAB — CBC
HCT: 37.1 % — ABNORMAL LOW (ref 39.0–52.0)
Hemoglobin: 11.4 g/dL — ABNORMAL LOW (ref 13.0–17.0)
MCH: 27.1 pg (ref 26.0–34.0)
MCHC: 30.7 g/dL (ref 30.0–36.0)
MCV: 88.1 fL (ref 80.0–100.0)
Platelets: 207 10*3/uL (ref 150–400)
RBC: 4.21 MIL/uL — ABNORMAL LOW (ref 4.22–5.81)
RDW: 16.9 % — ABNORMAL HIGH (ref 11.5–15.5)
WBC: 7 10*3/uL (ref 4.0–10.5)
nRBC: 0 % (ref 0.0–0.2)

## 2023-06-14 LAB — BASIC METABOLIC PANEL
Anion gap: 9 (ref 5–15)
BUN: 17 mg/dL (ref 8–23)
CO2: 23 mmol/L (ref 22–32)
Calcium: 8.8 mg/dL — ABNORMAL LOW (ref 8.9–10.3)
Chloride: 107 mmol/L (ref 98–111)
Creatinine, Ser: 1.12 mg/dL (ref 0.61–1.24)
GFR, Estimated: >60 mL/min (ref >60)
Glucose, Bld: 215 mg/dL — ABNORMAL HIGH (ref 70–99)
Potassium: 4 mmol/L (ref 3.5–5.1)
Sodium: 139 mmol/L (ref 135–145)

## 2023-06-14 LAB — TROPONIN I (HIGH SENSITIVITY): Troponin I (High Sensitivity): 10 ng/L (ref <18)

## 2023-06-14 NOTE — Patient Instructions (Signed)
To ER

## 2023-06-14 NOTE — Progress Notes (Unsigned)
L orbit bruised and bandaged.  Taking tylenol for pain.  More pain yesterday- better today.  He had skin cancer biopsy/MOHS yesterday.  No trauma otherwise.  Prev with L hand numbness, going on for years, esp at night- positional.  More recent months with more numbness while eating breakfast.  Then recently had L arm pain episodically at night.  He had pain with wrist flex and extension during episodes of arm pain.    L handed.  No R hand sx.    Then at 11:45 this AM had central chest discomfort.  Took NTG today and about 5 minutes later sx resolved.  H/o CAD.  He still feels an atypical sensation in the same area.  D/w pt and he took NTG dose at 3:52 here at clinic.  He consented for EMS transport, 911 called and in route.   Meds, vitals, and allergies reviewed.   ROS: Per HPI unless specifically indicated in ROS section   GEN: nad, alert and oriented HEENT: L eyelid bruising x2.  MOHS site bandaged.  NECK: supple w/o LA CV: IRR not tachy PULM: ctab, no inc wob ABD: soft, +bs  Recheck after NTG use. Central chest discomfort improved. 4:00 PM   Placed on O2 at 2L [place] and O2 sat 97% with chest discomfort improved.    I didn't give another dose of NTG given improved symptoms.  EMS arrived, discussed and transport pending.

## 2023-06-14 NOTE — ED Triage Notes (Signed)
Pt comes with c/o cp via [facility]. Pt had some cp earlier today at home and pain in left arm. Pt took 1 nitroglycerin and it relieved the pain. Pt later went to pcp for his appt and told doc about his cp from earlier and he was having it again. Pt given 1 nitroglycerin by pcp and pain left. Pcp called ems and had pt brought to ED  EMS gave pt 324 aspirin and pain denied any pain after the nitroglycerin from the doc office.   Pt has hx of MI and several stents.  VSS   Pt is on eliquis and did have recent procedure to his left eye. Swelling and bruising noted from that.

## 2023-06-15 ENCOUNTER — Emergency Department
Admission: EM | Admit: 2023-06-15 | Discharge: 2023-06-15 | Disposition: A | Discharge status: Home / Self Care | Payer: Medicare HMO | Attending: Emergency Medicine | Admitting: Emergency Medicine

## 2023-06-15 DIAGNOSIS — R0789 Other chest pain: Secondary | ICD-10-CM

## 2023-06-15 LAB — TROPONIN I (HIGH SENSITIVITY): Troponin I (High Sensitivity): 10 ng/L (ref <18)

## 2023-06-15 NOTE — ED Provider Notes (Signed)
[facility] Provider Note    Event Date/Time   First MD Initiated Contact with Patient 06/15/23 0125     (approximate)   History   Chest Pain   HPI  Ryan Garcia is a 89 y.o. male who presents to the ED for evaluation of Chest Pain   I review medical dc summary from September. Hx CAD s/p stenting and CABG. pAfib on eliquis. CKD.   Patient presents to the ED alongside his wife for evaluation of chest pain that has since resolved.  Pain around 4 PM when he was at family medicine clinic and they called EMS for him.  He received nitroglycerin at the clinic and has been pain-free since then.  He unfortunately waits about 9 hours before my evaluation due to prolonged wait times and bed holds.  He has been pain-free this whole time, and is pain-free now, asking to go home.   Physical Exam   Triage Vital Signs: ED Triage Vitals  Encounter Vitals Group     BP 06/14/23 1715 136/80     Systolic BP Percentile --      Diastolic BP Percentile --      Pulse Rate 06/14/23 1715 66     Resp 06/14/23 1715 18     Temp 06/14/23 1715 98.4 F (36.9 C)     Temp Source 06/15/23 0107 Oral     SpO2 06/14/23 1715 98 %     Weight --      Height --      Head Circumference --      Peak Flow --      Pain Score 06/14/23 1713 0     Pain Loc --      Pain Education --      Exclude from Growth Chart --     Most recent vital signs: Vitals:   06/14/23 1715 06/15/23 0107  BP: 136/80 133/81  Pulse: 66 76  Resp: 18 17  Temp: 98.4 F (36.9 C) 98.4 F (36.9 C)  SpO2: 98% 98%    General: Awake, no distress.  CV:  Good peripheral perfusion.  Resp:  Normal effort.  Abd:  No distention.  MSK:  No deformity noted.  Neuro:  No focal deficits appreciated. Other:     ED Results / Procedures / Treatments   Labs (all labs ordered are listed, but only abnormal results are displayed) Labs Reviewed  BASIC METABOLIC PANEL - Abnormal; Notable for the following components:       Result Value   Glucose, Bld 215 (*)    Calcium 8.8 (*)    All other components within normal limits  CBC - Abnormal; Notable for the following components:   RBC 4.21 (*)    Hemoglobin 11.4 (*)    HCT 37.1 (*)    RDW 16.9 (*)    All other components within normal limits  TROPONIN I (HIGH SENSITIVITY)  TROPONIN I (HIGH SENSITIVITY)    EKG A-fib with a rate of 62 bpm.  Normal axis and intervals.  No clear signs of acute ischemia.  RADIOLOGY CXR interpreted by me without evidence of acute cardiopulmonary pathology.  Official radiology report(s): DG Chest 2 View Result Date: 06/14/2023 CLINICAL DATA:  Chest pain EXAM: CHEST - 2 VIEW COMPARISON:  04/29/2023 FINDINGS: The cardiac silhouette, mediastinal and hilar contours are within normal limits. The lungs are clear of an acute process. No infiltrates, edema or effusions. The bony thorax is intact. IMPRESSION: No acute cardiopulmonary findings.  Electronically Signed   By: Rudie Meyer M.D.   On: 06/14/2023 19:49    PROCEDURES and INTERVENTIONS:  .1-3 Lead EKG Interpretation  Performed by: Delton Prairie, MD Authorized by: Delton Prairie, MD     Interpretation: normal     ECG rate:  74   ECG rate assessment: normal     Rhythm: atrial fibrillation     Ectopy: none     Conduction: normal     Medications - No data to display   IMPRESSION / MDM / ASSESSMENT AND PLAN / ED COURSE  I reviewed the triage vital signs and the nursing notes.  Differential diagnosis includes, but is not limited to, ACS, PTX, PNA, muscle strain/spasm, PE, dissection, anxiety, pleural effusion  {Patient presents with symptoms of an acute illness or injury that is potentially life-threatening.  Patient with cardiac history presents with a resolved episode of chest discomfort, with a benign workup and suitable for outpatient management.  EKG is nonischemic and he has 2 negative troponins, clear CXR, reassuring CBC and metabolic panel.  No barriers to  outpatient management.  I considered observation admission but he is requesting discharge and acknowledges risks.      FINAL CLINICAL IMPRESSION(S) / ED DIAGNOSES   Final diagnoses:  Other chest pain     Rx / DC Orders   ED Discharge Orders     None        Note:  This document was prepared using Dragon voice recognition software and may include unintentional dictation errors.   Delton Prairie, MD 06/15/23 [phone]

## 2023-06-16 NOTE — Assessment & Plan Note (Signed)
  Recheck after NTG use. Central chest discomfort improved. 4:00 PM   Placed on O2 at 2L [place] and O2 sat 97% with chest discomfort improved.    I didn't give another dose of NTG given improved symptoms.  EMS arrived, discussed and transport pending.   Concern for cardiac source.  Discussed with patient.  Transport to ER.  36 minutes were devoted to patient care in this encounter (this includes time spent reviewing the patient's file/history, interviewing and examining the patient, counseling/reviewing plan with patient).

## 2023-06-18 ENCOUNTER — Other Ambulatory Visit: Payer: Self-pay

## 2023-06-18 ENCOUNTER — Emergency Department (HOSPITAL_COMMUNITY): Payer: Medicare HMO

## 2023-06-18 ENCOUNTER — Inpatient Hospital Stay (HOSPITAL_COMMUNITY)
Admission: EM | Admit: 2023-06-18 | Discharge: 2023-07-03 | DRG: 064 | Disposition: E | Payer: Medicare HMO | Attending: Neurology | Admitting: Neurology

## 2023-06-18 DIAGNOSIS — S0083XA Contusion of other part of head, initial encounter: Secondary | ICD-10-CM | POA: Diagnosis not present

## 2023-06-18 DIAGNOSIS — Z79899 Other long term (current) drug therapy: Secondary | ICD-10-CM | POA: Diagnosis not present

## 2023-06-18 DIAGNOSIS — E1165 Type 2 diabetes mellitus with hyperglycemia: Secondary | ICD-10-CM | POA: Diagnosis present

## 2023-06-18 DIAGNOSIS — R4182 Altered mental status, unspecified: Secondary | ICD-10-CM | POA: Diagnosis not present

## 2023-06-18 DIAGNOSIS — Z515 Encounter for palliative care: Secondary | ICD-10-CM

## 2023-06-18 DIAGNOSIS — I619 Nontraumatic intracerebral hemorrhage, unspecified: Secondary | ICD-10-CM | POA: Diagnosis present

## 2023-06-18 DIAGNOSIS — G8194 Hemiplegia, unspecified affecting left nondominant side: Secondary | ICD-10-CM | POA: Diagnosis present

## 2023-06-18 DIAGNOSIS — G935 Compression of brain: Secondary | ICD-10-CM | POA: Diagnosis not present

## 2023-06-18 DIAGNOSIS — I6523 Occlusion and stenosis of bilateral carotid arteries: Secondary | ICD-10-CM | POA: Diagnosis not present

## 2023-06-18 DIAGNOSIS — H5704 Mydriasis: Secondary | ICD-10-CM | POA: Diagnosis present

## 2023-06-18 DIAGNOSIS — R1310 Dysphagia, unspecified: Secondary | ICD-10-CM | POA: Diagnosis present

## 2023-06-18 DIAGNOSIS — K219 Gastro-esophageal reflux disease without esophagitis: Secondary | ICD-10-CM | POA: Diagnosis present

## 2023-06-18 DIAGNOSIS — J9601 Acute respiratory failure with hypoxia: Secondary | ICD-10-CM | POA: Diagnosis present

## 2023-06-18 DIAGNOSIS — I629 Nontraumatic intracranial hemorrhage, unspecified: Secondary | ICD-10-CM | POA: Diagnosis not present

## 2023-06-18 DIAGNOSIS — Z7901 Long term (current) use of anticoagulants: Secondary | ICD-10-CM

## 2023-06-18 DIAGNOSIS — E119 Type 2 diabetes mellitus without complications: Secondary | ICD-10-CM

## 2023-06-18 DIAGNOSIS — G919 Hydrocephalus, unspecified: Secondary | ICD-10-CM | POA: Diagnosis not present

## 2023-06-18 DIAGNOSIS — G4733 Obstructive sleep apnea (adult) (pediatric): Secondary | ICD-10-CM | POA: Diagnosis not present

## 2023-06-18 DIAGNOSIS — Z888 Allergy status to other drugs, medicaments and biological substances status: Secondary | ICD-10-CM

## 2023-06-18 DIAGNOSIS — I613 Nontraumatic intracerebral hemorrhage in brain stem: Secondary | ICD-10-CM | POA: Diagnosis present

## 2023-06-18 DIAGNOSIS — S0003XA Contusion of scalp, initial encounter: Secondary | ICD-10-CM | POA: Diagnosis not present

## 2023-06-18 DIAGNOSIS — I4891 Unspecified atrial fibrillation: Secondary | ICD-10-CM | POA: Diagnosis not present

## 2023-06-18 DIAGNOSIS — M419 Scoliosis, unspecified: Secondary | ICD-10-CM | POA: Diagnosis present

## 2023-06-18 DIAGNOSIS — R29898 Other symptoms and signs involving the musculoskeletal system: Secondary | ICD-10-CM | POA: Diagnosis not present

## 2023-06-18 DIAGNOSIS — I615 Nontraumatic intracerebral hemorrhage, intraventricular: Secondary | ICD-10-CM | POA: Diagnosis present

## 2023-06-18 DIAGNOSIS — I2510 Atherosclerotic heart disease of native coronary artery without angina pectoris: Secondary | ICD-10-CM | POA: Diagnosis present

## 2023-06-18 DIAGNOSIS — E1122 Type 2 diabetes mellitus with diabetic chronic kidney disease: Secondary | ICD-10-CM | POA: Diagnosis present

## 2023-06-18 DIAGNOSIS — G936 Cerebral edema: Secondary | ICD-10-CM | POA: Diagnosis not present

## 2023-06-18 DIAGNOSIS — Z8249 Family history of ischemic heart disease and other diseases of the circulatory system: Secondary | ICD-10-CM

## 2023-06-18 DIAGNOSIS — N189 Chronic kidney disease, unspecified: Secondary | ICD-10-CM | POA: Diagnosis present

## 2023-06-18 DIAGNOSIS — I610 Nontraumatic intracerebral hemorrhage in hemisphere, subcortical: Secondary | ICD-10-CM | POA: Diagnosis not present

## 2023-06-18 DIAGNOSIS — E785 Hyperlipidemia, unspecified: Secondary | ICD-10-CM | POA: Diagnosis present

## 2023-06-18 DIAGNOSIS — Z66 Do not resuscitate: Secondary | ICD-10-CM | POA: Diagnosis present

## 2023-06-18 DIAGNOSIS — I129 Hypertensive chronic kidney disease with stage 1 through stage 4 chronic kidney disease, or unspecified chronic kidney disease: Secondary | ICD-10-CM | POA: Diagnosis present

## 2023-06-18 DIAGNOSIS — Z85828 Personal history of other malignant neoplasm of skin: Secondary | ICD-10-CM | POA: Diagnosis not present

## 2023-06-18 DIAGNOSIS — I480 Paroxysmal atrial fibrillation: Secondary | ICD-10-CM | POA: Diagnosis present

## 2023-06-18 DIAGNOSIS — R464 Slowness and poor responsiveness: Secondary | ICD-10-CM | POA: Diagnosis present

## 2023-06-18 DIAGNOSIS — I252 Old myocardial infarction: Secondary | ICD-10-CM

## 2023-06-18 DIAGNOSIS — N400 Enlarged prostate without lower urinary tract symptoms: Secondary | ICD-10-CM | POA: Diagnosis present

## 2023-06-18 DIAGNOSIS — G911 Obstructive hydrocephalus: Secondary | ICD-10-CM

## 2023-06-18 DIAGNOSIS — Z794 Long term (current) use of insulin: Secondary | ICD-10-CM | POA: Diagnosis not present

## 2023-06-18 DIAGNOSIS — I4892 Unspecified atrial flutter: Secondary | ICD-10-CM | POA: Diagnosis present

## 2023-06-18 DIAGNOSIS — I700 Atherosclerosis of aorta: Secondary | ICD-10-CM | POA: Diagnosis not present

## 2023-06-18 DIAGNOSIS — Z7984 Long term (current) use of oral hypoglycemic drugs: Secondary | ICD-10-CM

## 2023-06-18 DIAGNOSIS — Z7902 Long term (current) use of antithrombotics/antiplatelets: Secondary | ICD-10-CM

## 2023-06-18 DIAGNOSIS — G4489 Other headache syndrome: Secondary | ICD-10-CM | POA: Diagnosis not present

## 2023-06-18 DIAGNOSIS — Z4682 Encounter for fitting and adjustment of non-vascular catheter: Secondary | ICD-10-CM | POA: Diagnosis not present

## 2023-06-18 DIAGNOSIS — R58 Hemorrhage, not elsewhere classified: Secondary | ICD-10-CM | POA: Diagnosis not present

## 2023-06-18 DIAGNOSIS — R29700 NIHSS score 0: Secondary | ICD-10-CM | POA: Diagnosis present

## 2023-06-18 DIAGNOSIS — R404 Transient alteration of awareness: Secondary | ICD-10-CM | POA: Diagnosis not present

## 2023-06-18 DIAGNOSIS — Z955 Presence of coronary angioplasty implant and graft: Secondary | ICD-10-CM

## 2023-06-18 LAB — ETHANOL: Alcohol, Ethyl (B): <10 mg/dL (ref <10)

## 2023-06-18 LAB — COMPREHENSIVE METABOLIC PANEL
ALT: 37 U/L (ref 0–44)
AST: 20 U/L (ref 15–41)
Albumin: 3.3 g/dL — ABNORMAL LOW (ref 3.5–5.0)
Alkaline Phosphatase: 104 U/L (ref 38–126)
Anion gap: 14 (ref 5–15)
BUN: 16 mg/dL (ref 8–23)
CO2: 23 mmol/L (ref 22–32)
Calcium: 9.3 mg/dL (ref 8.9–10.3)
Chloride: 105 mmol/L (ref 98–111)
Creatinine, Ser: 0.77 mg/dL (ref 0.61–1.24)
GFR, Estimated: >60 mL/min (ref >60)
Glucose, Bld: 113 mg/dL — ABNORMAL HIGH (ref 70–99)
Potassium: 3.8 mmol/L (ref 3.5–5.1)
Sodium: 142 mmol/L (ref 135–145)
Total Bilirubin: 0.8 mg/dL (ref <1.2)
Total Protein: 7 g/dL (ref 6.5–8.1)

## 2023-06-18 LAB — CBC
HCT: 39.6 % (ref 39.0–52.0)
Hemoglobin: 12.2 g/dL — ABNORMAL LOW (ref 13.0–17.0)
MCH: 26.9 pg (ref 26.0–34.0)
MCHC: 30.8 g/dL (ref 30.0–36.0)
MCV: 87.4 fL (ref 80.0–100.0)
Platelets: 204 10*3/uL (ref 150–400)
RBC: 4.53 MIL/uL (ref 4.22–5.81)
RDW: 17 % — ABNORMAL HIGH (ref 11.5–15.5)
WBC: 7.3 10*3/uL (ref 4.0–10.5)
nRBC: 0 % (ref 0.0–0.2)

## 2023-06-18 LAB — URINALYSIS, ROUTINE W REFLEX MICROSCOPIC
Bacteria, UA: NONE SEEN
Bilirubin Urine: NEGATIVE
Glucose, UA: NEGATIVE mg/dL
Hgb urine dipstick: NEGATIVE
Ketones, ur: NEGATIVE mg/dL
Leukocytes,Ua: NEGATIVE
Nitrite: NEGATIVE
Protein, ur: 100 mg/dL — AB
Specific Gravity, Urine: 1.015 (ref 1.005–1.030)
pH: 7 (ref 5.0–8.0)

## 2023-06-18 LAB — I-STAT CHEM 8, ED
BUN: 18 mg/dL (ref 8–23)
Calcium, Ion: 1.18 mmol/L (ref 1.15–1.40)
Chloride: 103 mmol/L (ref 98–111)
Creatinine, Ser: 1 mg/dL (ref 0.61–1.24)
Glucose, Bld: 108 mg/dL — ABNORMAL HIGH (ref 70–99)
HCT: 38 % — ABNORMAL LOW (ref 39.0–52.0)
Hemoglobin: 12.9 g/dL — ABNORMAL LOW (ref 13.0–17.0)
Potassium: 3.8 mmol/L (ref 3.5–5.1)
Sodium: 143 mmol/L (ref 135–145)
TCO2: 26 mmol/L (ref 22–32)

## 2023-06-18 LAB — DIFFERENTIAL
Abs Immature Granulocytes: 0.03 10*3/uL (ref 0.00–0.07)
Basophils Absolute: 0 10*3/uL (ref 0.0–0.1)
Basophils Relative: 0 %
Eosinophils Absolute: 0.3 10*3/uL (ref 0.0–0.5)
Eosinophils Relative: 4 %
Immature Granulocytes: 0 %
Lymphocytes Relative: 20 %
Lymphs Abs: 1.5 10*3/uL (ref 0.7–4.0)
Monocytes Absolute: 1.1 10*3/uL — ABNORMAL HIGH (ref 0.1–1.0)
Monocytes Relative: 14 %
Neutro Abs: 4.4 10*3/uL (ref 1.7–7.7)
Neutrophils Relative %: 62 %

## 2023-06-18 LAB — RAPID URINE DRUG SCREEN, HOSP PERFORMED
Amphetamines: NOT DETECTED
Barbiturates: NOT DETECTED
Benzodiazepines: NOT DETECTED
Cocaine: NOT DETECTED
Opiates: NOT DETECTED
Tetrahydrocannabinol: NOT DETECTED

## 2023-06-18 LAB — CG4 I-STAT (LACTIC ACID): Lactic Acid, Venous: 1 mmol/L (ref 0.5–1.9)

## 2023-06-18 LAB — CBG MONITORING, ED: Glucose-Capillary: 96 mg/dL (ref 70–99)

## 2023-06-18 LAB — MRSA NEXT GEN BY PCR, NASAL: MRSA by PCR Next Gen: NOT DETECTED

## 2023-06-18 LAB — SODIUM: Sodium: 140 mmol/L (ref 135–145)

## 2023-06-18 LAB — APTT: aPTT: 38 s — ABNORMAL HIGH (ref 24–36)

## 2023-06-18 LAB — PROTIME-INR
INR: 1.4 — ABNORMAL HIGH (ref 0.8–1.2)
Prothrombin Time: 17.4 s — ABNORMAL HIGH (ref 11.4–15.2)

## 2023-06-18 MED ORDER — SUCCINYLCHOLINE CHLORIDE 20 MG/ML IJ SOLN
INTRAMUSCULAR | Status: AC | PRN
  Administered 2023-06-18: 120 mg via INTRAVENOUS

## 2023-06-18 MED ORDER — CHLORHEXIDINE GLUCONATE CLOTH 2 % EX PADS
6.0000 | MEDICATED_PAD | Freq: Every day | CUTANEOUS | Status: DC
  Administered 2023-06-18: 6 via TOPICAL

## 2023-06-18 MED ORDER — ORAL CARE MOUTH RINSE: 15.0000 mL | OROMUCOSAL | Status: DC | PRN

## 2023-06-18 MED ORDER — POLYVINYL ALCOHOL 1.4 % OP SOLN: 1.0000 [drp] | Freq: Four times a day (QID) | OPHTHALMIC | Status: DC | PRN

## 2023-06-18 MED ORDER — INSULIN ASPART 100 UNIT/ML IJ SOLN: 0.0000 [IU] | INTRAMUSCULAR | Status: DC

## 2023-06-18 MED ORDER — MORPHINE 100MG IN NS 100ML (1MG/ML) PREMIX INFUSION
1.0000 mg/h | INTRAVENOUS | Status: DC
  Administered 2023-06-18: 1 mg/h via INTRAVENOUS
  Filled 2023-06-18: qty 100

## 2023-06-18 MED ORDER — SENNOSIDES-DOCUSATE SODIUM 8.6-50 MG PO TABS: 1.0000 | ORAL_TABLET | Freq: Two times a day (BID) | ORAL | Status: DC

## 2023-06-18 MED ORDER — SODIUM CHLORIDE 0.9 % IV SOLN
INTRAVENOUS | Status: DC
Start: 2023-06-18 — End: 2023-06-18

## 2023-06-18 MED ORDER — FENTANYL CITRATE PF 50 MCG/ML IJ SOSY
25.0000 ug | PREFILLED_SYRINGE | INTRAMUSCULAR | Status: DC | PRN
Start: 2023-06-18 — End: 2023-06-18

## 2023-06-18 MED ORDER — ACETAMINOPHEN 160 MG/5ML PO SOLN: 650.0000 mg | ORAL | Status: DC | PRN

## 2023-06-18 MED ORDER — CLEVIDIPINE BUTYRATE 0.5 MG/ML IV EMUL
0.0000 mg/h | INTRAVENOUS | Status: DC
  Administered 2023-06-18: 2 mg/h via INTRAVENOUS
  Filled 2023-06-18: qty 100

## 2023-06-18 MED ORDER — EMPTY CONTAINERS FLEXIBLE MISC
900.0000 mg | Freq: Once | Status: AC
  Administered 2023-06-18: 900 mg via INTRAVENOUS
  Filled 2023-06-18: qty 90

## 2023-06-18 MED ORDER — FENTANYL CITRATE PF 50 MCG/ML IJ SOSY
PREFILLED_SYRINGE | INTRAMUSCULAR | Status: AC
  Filled 2023-06-18: qty 2

## 2023-06-18 MED ORDER — PROPOFOL 1000 MG/100ML IV EMUL
5.0000 ug/kg/min | INTRAVENOUS | Status: DC
  Administered 2023-06-18: 15 ug/kg/min via INTRAVENOUS

## 2023-06-18 MED ORDER — IOHEXOL 350 MG/ML SOLN
75.0000 mL | Freq: Once | INTRAVENOUS | Status: AC | PRN
  Administered 2023-06-18: 75 mL via INTRAVENOUS

## 2023-06-18 MED ORDER — GLYCOPYRROLATE 1 MG PO TABS: 1.0000 mg | ORAL_TABLET | ORAL | Status: DC | PRN

## 2023-06-18 MED ORDER — STROKE: EARLY STAGES OF RECOVERY BOOK: Freq: Once | Status: DC

## 2023-06-18 MED ORDER — GLYCOPYRROLATE 0.2 MG/ML IJ SOLN: 0.2000 mg | INTRAMUSCULAR | Status: DC | PRN

## 2023-06-18 MED ORDER — ACETAMINOPHEN 650 MG RE SUPP: 650.0000 mg | RECTAL | Status: DC | PRN

## 2023-06-18 MED ORDER — ACETAMINOPHEN 325 MG PO TABS: 650.0000 mg | ORAL_TABLET | ORAL | Status: DC | PRN

## 2023-06-18 MED ORDER — SODIUM CHLORIDE 3 % IV BOLUS
250.0000 mL | Freq: Once | INTRAVENOUS | Status: AC
Start: 2023-06-18 — End: 2023-06-18
  Administered 2023-06-18: 250 mL via INTRAVENOUS
  Filled 2023-06-18: qty 500

## 2023-06-18 MED ORDER — ETOMIDATE 2 MG/ML IV SOLN
INTRAVENOUS | Status: AC | PRN
  Administered 2023-06-18: 20 mg via INTRAVENOUS

## 2023-06-18 MED ORDER — ORAL CARE MOUTH RINSE
15.0000 mL | OROMUCOSAL | Status: DC
  Administered 2023-06-18: 15 mL via OROMUCOSAL

## 2023-06-18 MED ORDER — GLYCOPYRROLATE 0.2 MG/ML IJ SOLN: 0.2000 mg | Freq: Once | INTRAMUSCULAR | Status: DC

## 2023-06-18 MED ORDER — ORAL CARE MOUTH RINSE
15.0000 mL | OROMUCOSAL | Status: DC
  Administered 2023-06-18 (×3): 15 mL via OROMUCOSAL

## 2023-06-18 MED ORDER — LABETALOL HCL 5 MG/ML IV SOLN
10.0000 mg | Freq: Once | INTRAVENOUS | Status: AC
  Administered 2023-06-18: 10 mg via INTRAVENOUS

## 2023-06-18 MED ORDER — GLYCOPYRROLATE 0.2 MG/ML IJ SOLN
0.2000 mg | INTRAMUSCULAR | Status: DC | PRN
  Administered 2023-06-18 (×2): 0.2 mg via INTRAVENOUS
  Filled 2023-06-18 (×2): qty 1

## 2023-06-18 MED ORDER — DOCUSATE SODIUM 50 MG/5ML PO LIQD: 100.0000 mg | Freq: Two times a day (BID) | ORAL | Status: DC

## 2023-06-18 MED ORDER — SCOPOLAMINE 1 MG/3DAYS TD PT72: 1.0000 | MEDICATED_PATCH | TRANSDERMAL | Status: DC

## 2023-06-18 MED ORDER — SENNOSIDES 8.8 MG/5ML PO SYRP
5.0000 mL | ORAL_SOLUTION | Freq: Two times a day (BID) | ORAL | Status: DC
  Filled 2023-06-18 (×2): qty 5

## 2023-06-18 MED ORDER — MORPHINE BOLUS VIA INFUSION
2.0000 mg | INTRAVENOUS | Status: DC | PRN
  Administered 2023-06-18 (×2): 2 mg via INTRAVENOUS

## 2023-06-18 MED ORDER — FENTANYL CITRATE PF 50 MCG/ML IJ SOSY: 25.0000 ug | PREFILLED_SYRINGE | INTRAMUSCULAR | Status: DC | PRN

## 2023-06-18 MED ORDER — POLYETHYLENE GLYCOL 3350 17 G PO PACK: 17.0000 g | PACK | Freq: Every day | ORAL | Status: DC

## 2023-06-18 MED ORDER — ACETAMINOPHEN 325 MG PO TABS: 650.0000 mg | ORAL_TABLET | Freq: Four times a day (QID) | ORAL | Status: DC | PRN

## 2023-06-18 MED ORDER — PANTOPRAZOLE SODIUM 40 MG IV SOLR
40.0000 mg | Freq: Every day | INTRAVENOUS | Status: DC
Start: 2023-06-18 — End: 2023-06-18

## 2023-06-18 MED ORDER — SODIUM CHLORIDE 3 % IV SOLN
INTRAVENOUS | Status: DC
  Filled 2023-06-18 (×2): qty 500

## 2023-06-18 MED ORDER — ACETAMINOPHEN 650 MG RE SUPP: 650.0000 mg | Freq: Four times a day (QID) | RECTAL | Status: DC | PRN

## 2023-06-21 ENCOUNTER — Inpatient Hospital Stay: Payer: Medicare HMO | Admitting: Family Medicine

## 2023-06-27 ENCOUNTER — Inpatient Hospital Stay: Payer: Medicare HMO | Admitting: Cardiology

## 2023-07-03 NOTE — Consult Note (Signed)
[facility] Liaison Consult Note    06/25/2023  UMAIR GRADDICK 31-Aug-1933 [id number]  Primary Care Provider: Judy Pimple, MD   Referral:  Patient was a Red EMMI referral  Patient is currently intubated and in ICU.  Will follow for progress and needs as appropriate.  Plan: Following Charlesetta Shanks, RN, BSN, CCM [place]  [facility] Liaison Direct Dial: [phone] or secure chat Email: [email or web address]       .

## 2023-07-03 NOTE — Progress Notes (Signed)
RT assisted with transport of this pt from ED to 4n17 while on full ventilatory support. Pt tolerated well with SVS. 4N RT given report.

## 2023-07-03 NOTE — ED Provider Notes (Signed)
[place] EMERGENCY DEPARTMENT AT [facility] Provider Note   CSN: [id number] Arrival date & time: 06/16/2023  0256     History  Chief Complaint  Patient presents with   Code Stroke   Level 5 caveat due to altered mental status Ryan Garcia is a 89 y.o. male.  The history is provided by the EMS personnel.  Patient with extensive history including CAD, diabetes, atrial fibrillation on anticoagulation presents for altered mental status Patient arrived as a code stroke via EMS He was last known well around 2330 on December 16.  He had reported a slight headache.  Wife reports several hours later he woke up and was not "acting right" he also noted to have left arm weakness.  EMS reports patient had worsening mental status en route  Glucose noted to be over 100  Of note he had recent skin surgery above his left eye and has bilateral periorbital hematomas that are not new.  No falls reported Past Medical History:  Diagnosis Date   AC (acromioclavicular) joint bone spurs    lt ankle   Allergy    allergic rhinitis   Anginal pain (HCC)    Arthritis    OA   BPH (benign prostatic hyperplasia)    microwave tx of prostate   CAD (coronary artery disease)    a. s/p cath in 2012 showing 70-75% LAD stenosis, 75% D1 stenosis, 75% OM1, 60-70% RCA stenosis, and 80% PDA --> medical therapy pursued b. similar findings by cath in 2016; 06/2017 DES to distal RCA   Chronic kidney disease    Chronic upper back pain    Diabetes mellitus    type II x8 years   GERD (gastroesophageal reflux disease)    HLD (hyperlipidemia)    10/12: TC 208, TG 213, HDL 36, LDL 129   Hx of skin cancer, basal cell    many skin cancers removed/under constant treatment.   Hypertension    NSTEMI (non-ST elevated myocardial infarction) (HCC) 06/18/2017   DES to distal RCA   Obstructive sleep apnea on CPAP    Paroxysmal atrial flutter (HCC)    Renal mass    Scoliosis     Home Medications Prior to  Admission medications   Medication Sig Start Date End Date Taking? Authorizing Provider  Multiple Vitamins-Minerals (PRESERVISION AREDS 2 PO) Take 1 tablet by mouth daily.   Yes [provider]  acetaminophen (TYLENOL) 325 MG tablet Take 325-650 mg by mouth every 6 (six) hours as needed for mild pain or headache.    [provider]  amLODipine (NORVASC) 5 MG tablet Take 5 mg by mouth daily. 04/04/23   [provider]  apixaban (ELIQUIS) 5 MG TABS tablet TAKE 1 TABLET TWICE DAILY 11/28/22   Lewayne Bunting, MD  cetirizine (ZYRTEC) 10 MG tablet Take 10 mg by mouth daily.    [provider]  clopidogrel (PLAVIX) 75 MG tablet Take 1 tablet (75 mg total) by mouth daily. 09/12/22   Tower, Audrie Gallus, MD  Coenzyme Q10 (CO Q 10 PO) Take 200 mg by mouth daily.    [provider]  Cyanocobalamin (VITAMIN B12) 1000 MCG TBCR Take 1,000 mcg by mouth daily. 04/12/20   Arty Baumgartner, NP  furosemide (LASIX) 20 MG tablet TAKE 1 TABLET EVERY DAY 12/31/22   Lewayne Bunting, MD  insulin glargine (LANTUS SOLOSTAR) 100 UNIT/ML Solostar Pen Inject 38 Units into the skin daily.    [provider]  isosorbide  mononitrate (IMDUR) 30 MG 24 hr tablet Take 3 tablets (90 mg total) by mouth daily. 12/23/22   Enedina Finner, MD  metFORMIN (GLUCOPHAGE) 1000 MG tablet Take 1 tablet (1,000 mg total) by mouth 2 (two) times daily with a meal. 01/21/23   Eden Emms, NP  metoprolol tartrate (LOPRESSOR) 25 MG tablet TAKE 1/2 TABLET TWICE DAILY 12/20/22   Lewayne Bunting, MD  nitroGLYCERIN (NITROSTAT) 0.4 MG SL tablet Place 1 tablet (0.4 mg total) under the tongue every 5 (five) minutes as needed for chest pain. 05/11/19   Duke, Roe Rutherford, PA  NOVOLOG FLEXPEN 100 UNIT/ML FlexPen Inject into the skin every evening. 8 units in am 16 units in pm 03/15/23   [provider]  rosuvastatin (CRESTOR) 40 MG tablet TAKE 1 TABLET EVERY DAY 09/14/22   Lewayne Bunting, MD       Allergies    Loratadine and Simvastatin    Review of Systems   Review of Systems  Unable to perform ROS: Mental status change    Physical Exam Updated Vital Signs BP 137/73   Pulse 77   Temp (!) 97.5 F (36.4 C) (Axillary)   Resp 18   SpO2 100%  Physical Exam CONSTITUTIONAL: Ill-appearing, unresponsive HEAD: Normocephalic/atraumatic EYES: Pupils are fixed and nonreactive.  Bilateral periorbital edema/bruising noted ENMT: Mucous membranes moist NECK: supple no meningeal signs CV: Tachycardic LUNGS: Sonorous respirations ABDOMEN: soft, nontender NEURO: Pt is unresponsive.  GCS 4 Patient intermittently moving his right arm. No movement to the left side of his body EXTREMITIES: pulses normal/equal, no obvious deformities SKIN: warm, color normal   ED Results / Procedures / Treatments   Labs (all labs ordered are listed, but only abnormal results are displayed) Labs Reviewed  PROTIME-INR - Abnormal; Notable for the following components:      Result Value   Prothrombin Time 17.4 (*)    INR 1.4 (*)    All other components within normal limits  APTT - Abnormal; Notable for the following components:   aPTT 38 (*)    All other components within normal limits  CBC - Abnormal; Notable for the following components:   Hemoglobin 12.2 (*)    RDW 17.0 (*)    All other components within normal limits  DIFFERENTIAL - Abnormal; Notable for the following components:   Monocytes Absolute 1.1 (*)    All other components within normal limits  COMPREHENSIVE METABOLIC PANEL - Abnormal; Notable for the following components:   Glucose, Bld 113 (*)    Albumin 3.3 (*)    All other components within normal limits  I-STAT CHEM 8, ED - Abnormal; Notable for the following components:   Glucose, Bld 108 (*)    Hemoglobin 12.9 (*)    HCT 38.0 (*)    All other components within normal limits  ETHANOL  RAPID URINE DRUG SCREEN, HOSP PERFORMED  URINALYSIS, ROUTINE W REFLEX MICROSCOPIC   BLOOD GAS, ARTERIAL  CBG MONITORING, ED    EKG None  Radiology CT ANGIO HEAD NECK W WO CM (CODE STROKE) Result Date: 06/04/2023 CLINICAL DATA:  Left arm weakness EXAM: CT ANGIOGRAPHY HEAD AND NECK WITH AND WITHOUT CONTRAST TECHNIQUE: Multidetector CT imaging of the head and neck was performed using the standard protocol during bolus administration of intravenous contrast. Multiplanar CT image reconstructions and MIPs were obtained to evaluate the vascular anatomy. Carotid stenosis measurements (when applicable) are obtained utilizing NASCET criteria, using the distal internal carotid diameter as the denominator. RADIATION DOSE REDUCTION: This  exam was performed according to the departmental dose-optimization program which includes automated exposure control, adjustment of the mA and/or kV according to patient size and/or use of iterative reconstruction technique. CONTRAST:  75mL OMNIPAQUE IOHEXOL 350 MG/ML SOLN COMPARISON:  None Available. FINDINGS: CTA NECK FINDINGS Skeleton: No acute abnormality or high grade bony spinal canal stenosis. Actively bleeding left frontal scalp hematoma. Other neck: Normal pharynx, larynx and major salivary glands. No cervical lymphadenopathy. Unremarkable thyroid gland. Endotracheal intubation with tip at the level of the clavicular heads. Upper chest: Bandlike atelectasis within both mid lungs. Aortic arch: There is calcific atherosclerosis of the aortic arch. Conventional 3 vessel aortic branching pattern. RIGHT carotid system: No dissection, occlusion or aneurysm. There is calcified atherosclerosis extending into the proximal ICA, resulting in less than 50% stenosis. LEFT carotid system: No dissection, occlusion or aneurysm. Mild atherosclerotic calcification at the carotid bifurcation without hemodynamically significant stenosis. Vertebral arteries: Codominant configuration. There is no dissection, occlusion or flow-limiting stenosis to the skull base (V1-V3 segments).  CTA HEAD FINDINGS Redemonstration of large hematoma centered in the right thalamus and midbrain. There are small foci of probable active extravasation within the hematoma (series 8 images 92 and 101). POSTERIOR CIRCULATION: Bilateral V4 segment atherosclerosis with moderate stenosis, right greater than left. No proximal occlusion of the anterior or inferior cerebellar arteries. Basilar artery is normal. Superior cerebellar arteries are normal. Posterior cerebral arteries are normal. ANTERIOR CIRCULATION: Atherosclerotic calcification of the internal carotid arteries at the skull base without hemodynamically significant stenosis. Anterior cerebral arteries are normal. Middle cerebral arteries are normal. Venous sinuses: As permitted by contrast timing, patent. Anatomic variants: None Review of the MIP images confirms the above findings. IMPRESSION: 1. Large hematoma centered in the right thalamus and midbrain with two foci of probable active extravasation. 2. Actively bleeding left frontal scalp hematoma. 3. Bilateral V4 segment atherosclerosis with moderate stenosis, right greater than left. Aortic Atherosclerosis (ICD10-I70.0). Electronically Signed   By: Deatra Robinson M.D.   On: 06/12/2023 04:02   CT HEAD CODE STROKE WO CONTRAST Result Date: 06/23/2023 CLINICAL DATA:  Code stroke.  Left arm weakness EXAM: CT HEAD WITHOUT CONTRAST TECHNIQUE: Contiguous axial images were obtained from the base of the skull through the vertex without intravenous contrast. RADIATION DOSE REDUCTION: This exam was performed according to the departmental dose-optimization program which includes automated exposure control, adjustment of the mA and/or kV according to patient size and/or use of iterative reconstruction technique. COMPARISON:  08/02/2020 FINDINGS: Brain: There is a large intraparenchymal hematoma centered at the right thalamus and midbrain, measuring approximately 5.5 x 5.1 x 4.6 cm (65 mL). There is surrounding edema  and intraventricular extension blood products. There is mild hydrocephalus of the lateral ventricles. There is leftward deviation of the third ventricle but no other midline shift. Vascular: There is atherosclerotic calcification of both internal carotid arteries at the skull base. Skull: Large left frontal scalp hematoma.  No skull fracture Sinuses/Orbits: No acute finding. Other: None IMPRESSION: 1. Large intraparenchymal hematoma centered at the right thalamus and midbrain, measuring approximately 5.5 x 5.1 x 4.6 cm (65 mL). 2. Intraventricular extension of blood products with mild hydrocephalus of the lateral ventricles. 3. Leftward deviation of the third ventricle but no other midline shift. 4. Large left frontal scalp hematoma without skull fracture. Critical Value/emergent results were called by telephone at the time of interpretation on 06/20/2023 at 3:47 am to provider [facility] , who verbally acknowledged these results. Electronically Signed   By: Deatra Robinson  M.D.   On: 06/29/2023 03:47    Procedures Procedure Name: Intubation Date/Time: 06/05/2023 3:15 AM  Performed by: Zadie Rhine, MDPre-anesthesia Checklist: Patient identified Oxygen Delivery Method: Non-rebreather mask Preoxygenation: Pre-oxygenation with 100% oxygen Laryngoscope Size: Glidescope Grade View: Grade I Tube size: 7.5 mm Number of attempts: 1 Airway Equipment and Method: Video-laryngoscopy Placement Confirmation: ETT inserted through vocal cords under direct vision, CO2 detector and Breath sounds checked- equal and bilateral Secured at: 23 cm    .Critical Care  Performed by: Zadie Rhine, MD Authorized by: Zadie Rhine, MD   Critical care provider statement:    Critical care time (minutes):  40   Critical care start time:  06/08/2023 3:20 AM   Critical care end time:  06/12/2023 4:00 AM   Critical care time was exclusive of:  Separately billable procedures and treating other patients    Critical care was necessary to treat or prevent imminent or life-threatening deterioration of the following conditions:  CNS failure or compromise   Critical care was time spent personally by me on the following activities:  Examination of patient, pulse oximetry, re-evaluation of patient's condition, discussions with consultants, ordering and review of laboratory studies, ordering and review of radiographic studies, ordering and performing treatments and interventions, evaluation of patient's response to treatment and review of old charts   I assumed direction of critical care for this patient from another provider in my specialty: no     Care discussed with: admitting provider       Medications Ordered in ED Medications  propofol (DIPRIVAN) 1000 MG/100ML infusion (15 mcg/kg/min  106.4 kg Intravenous Infusion Verify 06/04/2023 0408)  coag fact Xa recombinant (ANDEXXA) low dose infusion 900 mg (has no administration in time range)  etomidate (AMIDATE) injection (20 mg Intravenous Given 06/23/2023 0309)  succinylcholine (ANECTINE) injection (120 mg Intravenous Given 06/23/2023 0309)  iohexol (OMNIPAQUE) 350 MG/ML injection 75 mL (75 mLs Intravenous Contrast Given 06/02/2023 0349)  labetalol (NORMODYNE) injection 10 mg (10 mg Intravenous Given 06/03/2023 0349)    ED Course/ Medical Decision Making/ A&P Clinical Course as of 07/02/2023 0440  Tue Jun 18, 2023  0324 Patient seen on arrival as a code stroke.  Patient with significant altered mental status with GCS of around 4 Patient was taken to a room for intubation for airway protection then will go to CT Patient is critically ill [DW]  0423 As expected, patient has large intraparenchymal hemorrhage.  Neurology is speaking to family at this time [DW]  0439 Discussed the case with neurology Dr. Derry Lory He has had extensive discussions with family.  Patient has extensive intracranial hemorrhage that is not likely to be survivable.  At this time patient  will be admitted to ICU under the neurology service [DW]    Clinical Course User Index [DW] Zadie Rhine, MD           Glasgow Coma Scale Score: 4                      Medical Decision Making Amount and/or Complexity of Data Reviewed Labs: ordered. Radiology: ordered.  Risk Prescription drug management. Decision regarding hospitalization.   This patient presents to the ED for concern of altered mental status, this involves an extensive number of treatment options, and is a complaint that carries with it a high risk of complications and morbidity.  The differential diagnosis includes but is not limited to CVA, intracranial hemorrhage, acute coronary syndrome, renal failure, urinary tract infection, electrolyte disturbance, pneumonia  Comorbidities that complicate the patient evaluation: Patient's presentation is complicated by their history of atrial fibrillation  Social Determinants of Health: History unknown  Additional history obtained: Additional history obtained from EMS  Records reviewed previous admission documents  Lab Tests: I Ordered, and personally interpreted labs.  The pertinent results include: Labs overall unremarkable  Imaging Studies ordered: I ordered imaging studies including CT scan head and X-ray chest   I independently visualized and interpreted imaging which showed large intracranial hemorrhage I agree with the radiologist interpretation  Cardiac Monitoring: The patient was maintained on a cardiac monitor.  I personally viewed and interpreted the cardiac monitor which showed an underlying rhythm of:  sinus rhythm  Medicines ordered and prescription drug management: I ordered medication including RSI meds for intubation and propofol  for sedation  Reevaluation of the patient after these medicines showed that the patient    improved    Critical Interventions:   intubation and admission to neuro ICU  Consultations Obtained: I requested  consultation with the admitting physician neurology , and discussed  findings as well as pertinent plan - they recommend: Admit to neuro ICU  Reevaluation: After the interventions noted above, I reevaluated the patient and found that they have :stayed the same  Complexity of problems addressed: Patient's presentation is most consistent with  acute presentation with potential threat to life or bodily function  Disposition: After consideration of the diagnostic results and the patient's response to treatment,  I feel that the patent would benefit from admission   .           Final Clinical Impression(s) / ED Diagnoses Final diagnoses:  Intracranial hemorrhage [facility])    Rx / DC Orders ED Discharge Orders     None         Zadie Rhine, MD 06/28/2023 [phone]

## 2023-07-03 NOTE — Progress Notes (Addendum)
STROKE TEAM PROGRESS NOTE   BRIEF HPI [name] is a 89 y.o. male with history of A-fib on Eliquis, and CAD on Plavix, diabetes, hypertension, MI, basal cell carcinoma status post Mohs surgery on 1212 presenting with poor responsiveness and left-sided weakness.  His wife noted that he was unable to speak and could not move his left arm and called EMS.  He arrived to the ED with decreased responsiveness and was intubated for airway protection.  He was found to have a large right basal ganglia ICH of about 65 cc with extension into the midbrain. ICH score 4  NIH on Admission 30   SIGNIFICANT HOSPITAL EVENTS 12/17-patient admitted found to have large right basal ganglia ICH, discussions held with family regarding goals of care and patient extubated and transition to comfort care  INTERIM HISTORY/SUBJECTIVE Discussions regarding goals of care were held with family, and family has decided to opt for compassionate extubation and transition to comfort care.  Patient was extubated at 0950. Patient has unfortunately presented with a nonsurvivable large intracerebral hemorrhage with ICH score of 4 with volume of 65 cubic cc with intraventricular extension.  OBJECTIVE  CBC    Component Value Date/Time   WBC 7.3 06/05/2023 0315   RBC 4.53 06/20/2023 0315   HGB 12.2 (L) 06/02/2023 0315   HGB 13.2 12/25/2022 1423   HCT 39.6 06/02/2023 0315   PLT 204 06/19/2023 0315   PLT 176 12/25/2022 1423   MCV 87.4 06/13/2023 0315   MCH 26.9 06/21/2023 0315   MCHC 30.8 06/21/2023 0315   RDW 17.0 (H) 06/03/2023 0315   LYMPHSABS 1.5 06/08/2023 0315   MONOABS 1.1 (H) 06/20/2023 0315   EOSABS 0.3 06/15/2023 0315   BASOSABS 0.0 07/02/2023 0315    BMET    Component Value Date/Time   NA 140 06/27/2023 0546   NA 144 12/06/2021 1344   K 3.8 06/02/2023 0315   CL 105 06/04/2023 0315   CO2 23 06/06/2023 0315   GLUCOSE 113 (H) 06/16/2023 0315   BUN 16 06/08/2023 0315   BUN 22 12/06/2021 1344    CREATININE 0.77 06/20/2023 0315   CREATININE 1.31 (H) 08/17/2022 1502   CALCIUM 9.3 06/14/2023 0315   EGFR 57 (L) 12/06/2021 1344   GFRNONAA >60 06/10/2023 0315    IMAGING past 24 hours DG Abdomen 1 View Result Date: 06/02/2023 CLINICAL DATA:  Intubation EXAM: ABDOMEN - 1 VIEW COMPARISON:  03/22/2023 FINDINGS: Endotracheal tube with tip at the stomach and side port near the GE junction. The bowel gas pattern is normal with no concerning mass effect or calcification. Artifact from EKG leads. IMPRESSION: Enteric tube with tip at the stomach and side port near the GE junction. Electronically Signed   By: Tiburcio Pea M.D.   On: 06/08/2023 05:56   DG Chest Portable 1 View Result Date: 06/15/2023 CLINICAL DATA:  Altered. EXAM: PORTABLE CHEST 1 VIEW COMPARISON:  06/14/2023 FINDINGS: Endotracheal tube with tip at the clavicular heads. An enteric tube reaches the stomach. Normal heart size and mediastinal contours. No acute infiltrate or edema. No effusion or pneumothorax. No acute osseous findings. IMPRESSION: Unremarkable hardware. No indication of acute cardiopulmonary disease. Electronically Signed   By: Tiburcio Pea M.D.   On: 07/02/2023 05:55   CT ANGIO HEAD NECK W WO CM (CODE STROKE) Result Date: 07/01/2023 CLINICAL DATA:  Left arm weakness EXAM: CT ANGIOGRAPHY HEAD AND NECK WITH AND WITHOUT CONTRAST TECHNIQUE: Multidetector CT imaging of the head and neck was performed using  the standard protocol during bolus administration of intravenous contrast. Multiplanar CT image reconstructions and MIPs were obtained to evaluate the vascular anatomy. Carotid stenosis measurements (when applicable) are obtained utilizing NASCET criteria, using the distal internal carotid diameter as the denominator. RADIATION DOSE REDUCTION: This exam was performed according to the departmental dose-optimization program which includes automated exposure control, adjustment of the mA and/or kV according to patient size  and/or use of iterative reconstruction technique. CONTRAST:  75mL OMNIPAQUE IOHEXOL 350 MG/ML SOLN COMPARISON:  None Available. FINDINGS: CTA NECK FINDINGS Skeleton: No acute abnormality or high grade bony spinal canal stenosis. Actively bleeding left frontal scalp hematoma. Other neck: Normal pharynx, larynx and major salivary glands. No cervical lymphadenopathy. Unremarkable thyroid gland. Endotracheal intubation with tip at the level of the clavicular heads. Upper chest: Bandlike atelectasis within both mid lungs. Aortic arch: There is calcific atherosclerosis of the aortic arch. Conventional 3 vessel aortic branching pattern. RIGHT carotid system: No dissection, occlusion or aneurysm. There is calcified atherosclerosis extending into the proximal ICA, resulting in less than 50% stenosis. LEFT carotid system: No dissection, occlusion or aneurysm. Mild atherosclerotic calcification at the carotid bifurcation without hemodynamically significant stenosis. Vertebral arteries: Codominant configuration. There is no dissection, occlusion or flow-limiting stenosis to the skull base (V1-V3 segments). CTA HEAD FINDINGS Redemonstration of large hematoma centered in the right thalamus and midbrain. There are small foci of probable active extravasation within the hematoma (series 8 images 92 and 101). POSTERIOR CIRCULATION: Bilateral V4 segment atherosclerosis with moderate stenosis, right greater than left. No proximal occlusion of the anterior or inferior cerebellar arteries. Basilar artery is normal. Superior cerebellar arteries are normal. Posterior cerebral arteries are normal. ANTERIOR CIRCULATION: Atherosclerotic calcification of the internal carotid arteries at the skull base without hemodynamically significant stenosis. Anterior cerebral arteries are normal. Middle cerebral arteries are normal. Venous sinuses: As permitted by contrast timing, patent. Anatomic variants: None Review of the MIP images confirms the above  findings. IMPRESSION: 1. Large hematoma centered in the right thalamus and midbrain with two foci of probable active extravasation. 2. Actively bleeding left frontal scalp hematoma. 3. Bilateral V4 segment atherosclerosis with moderate stenosis, right greater than left. Aortic Atherosclerosis (ICD10-I70.0). Electronically Signed   By: Deatra Robinson M.D.   On: 07/01/2023 04:02   CT HEAD CODE STROKE WO CONTRAST Result Date: 06/15/2023 CLINICAL DATA:  Code stroke.  Left arm weakness EXAM: CT HEAD WITHOUT CONTRAST TECHNIQUE: Contiguous axial images were obtained from the base of the skull through the vertex without intravenous contrast. RADIATION DOSE REDUCTION: This exam was performed according to the departmental dose-optimization program which includes automated exposure control, adjustment of the mA and/or kV according to patient size and/or use of iterative reconstruction technique. COMPARISON:  08/02/2020 FINDINGS: Brain: There is a large intraparenchymal hematoma centered at the right thalamus and midbrain, measuring approximately 5.5 x 5.1 x 4.6 cm (65 mL). There is surrounding edema and intraventricular extension blood products. There is mild hydrocephalus of the lateral ventricles. There is leftward deviation of the third ventricle but no other midline shift. Vascular: There is atherosclerotic calcification of both internal carotid arteries at the skull base. Skull: Large left frontal scalp hematoma.  No skull fracture Sinuses/Orbits: No acute finding. Other: None IMPRESSION: 1. Large intraparenchymal hematoma centered at the right thalamus and midbrain, measuring approximately 5.5 x 5.1 x 4.6 cm (65 mL). 2. Intraventricular extension of blood products with mild hydrocephalus of the lateral ventricles. 3. Leftward deviation of the third ventricle but no other midline  shift. 4. Large left frontal scalp hematoma without skull fracture. Critical Value/emergent results were called by telephone at the time of  interpretation on 06/04/2023 at 3:47 am to provider [facility] , who verbally acknowledged these results. Electronically Signed   By: Deatra Robinson M.D.   On: 06/15/2023 03:47    Vitals:   06/10/2023 0720 06/24/2023 0729 06/04/2023 0744 06/26/2023 0800  BP:    (!) 160/71  Pulse: 80  72 67  Resp: 18  18 18   Temp:  (!) 97.5 F (36.4 C) 98.3 F (36.8 C)   TempSrc:  Axillary Axillary   SpO2:   100% 100%  Weight:  102.4 kg       PHYSICAL EXAM General: Intubated elderly patient in no acute distress CV: Regular rate and rhythm on monitor Respiratory: Respirations synchronous with ventilator  NEURO (on sedation with low-dose morphine): Patient rests with eyes closed, pupils 4 mm fixed and dilated, oculocephalic reflex absent, will slightly withdraw extremities to distal pinch but does not follow commands or respond to voice.    ASSESSMENT/PLAN  Intracerebral Hemorrhage:  right basal ganglia ICH with extension into the midbrain and IVH Etiology: Likely related to Eliquis use Code Stroke CT head large IPH centered at the right thalamus and midbrain with IVH and leftward deviation of the third ventricle, large left frontal scalp hematoma CTA head & neck large hematoma in the right thalamus and midbrain with 2 foci of probable active extravasation, actively bleeding left frontal scalp hematoma, bilateral V4 stenosis 2D Echo EF 60 to 65%, mild LVH, normal left atrial size, no atrial level shunt LDL 38 HgbA1c 7.7 VTE prophylaxis -none, as patient is on comfort care clopidogrel 75 mg daily and Eliquis (apixaban) daily prior to admission, now on No antithrombotic secondary to ICH Therapy recommendations:  No follow up needed  Disposition: Comfort care  Atrial fibrillation Home Meds: Eliquis 5 mg twice daily Continue telemetry monitoring  Hypertension Home meds: Amlodipine 5 mg daily, metoprolol 12.5 mg twice daily Stable Patient is on comfort care, no pressure  goal  Hyperlipidemia Home meds: Rosuvastatin 40 mg daily LDL 38, goal < 70  Diabetes type II Uncontrolled Home meds: Insulin glargine 38 units daily, no NovoLog 8 units in the morning and 16 units at night, metformin 1000 mg twice daily HgbA1c 7.7, goal < 7.0  Dysphagia Patient has post-stroke dysphagia, SLP consulted    Diet   Diet NPO time specified   Advance diet as tolerated  Other Stroke Risk Factors Coronary artery disease   Other Active Problems Respiratory failure-patient was compassionately extubated this morning after transition to comfort care  Hospital day # 0  Patient seen by NP and MD, MD to edit note as needed. Cortney E Ernestina Columbia , MSN, AGACNP-BC [facility] See Amion for schedule and pager information 06/06/2023 12:50 PM   STROKE MD NOTE :  I have personally obtained history,examined this patient, reviewed notes, independently viewed imaging studies, participated in medical decision making and plan of care.ROS completed by me personally and pertinent positives fully documented  I have made any additions or clarifications directly to the above note. Agree with note above.  Patient unfortunately presented with a large nonsurvivable intracerebral hemorrhage with ICH score of 4 and volume of 65 cubic cc involving right basal ganglia with intraventricular extension with significant cytotoxic edema and midline shift.  Family understands poor prognosis and agreed to comfort care measures only patient will be terminally extubated kept comfortable.  Long discussion  with wife and multiple family members at the bedside and answered questions.  Discussed with Dr. Denese Killings critical care medicine. This patient is critically ill and at significant risk of neurological worsening, death and care requires constant monitoring of vital signs, hemodynamics,respiratory and cardiac monitoring, extensive review of multiple databases, frequent neurological assessment,  discussion with family, other specialists and medical decision making of high complexity.I have made any additions or clarifications directly to the above note.This critical care time does not reflect procedure time, or teaching time or supervisory time of PA/NP/Med Resident etc but could involve care discussion time.  I spent 30 minutes of neurocritical care time  in the care of  this patient.      Delia Heady, MD Medical Director [facility] Pager: [phone] 06/25/2023 3:02 PM   To contact Stroke Continuity provider, please refer to [email or web address]. After hours, contact General Neurology

## 2023-07-03 NOTE — Progress Notes (Signed)
Visited with Pt's. Family. Pastor at bedside. Provide assistance and support to [place] and family.  Chaplain available as needed.      Venida Jarvis, [place],  [facility], Pager [phone]

## 2023-07-03 NOTE — Progress Notes (Signed)
   06/10/2023 1616  Vitals  Pulse Rate (!) 0  Pulse Rate Source Apical  MEWS COLOR  MEWS Score Color Comfort Care Only  Oxygen Therapy  SpO2 (!) 0 %  MEWS Score  MEWS Temp 0  MEWS Systolic 1  MEWS Pulse 2  MEWS RR 0  MEWS LOC 3  MEWS Score 6   Time of death July 09, 1615

## 2023-07-03 NOTE — Procedures (Signed)
Extubation Procedure Note  Patient Details:   Name: CORNEILIUS BIENSTOCK DOB: Oct 12, 1933 MRN: [id number]   Airway Documentation:    Vent end date: 06/24/2023 Vent end time: 0950   Evaluation  O2 sats:  terminal extubation  Complications: No apparent complications Patient terminal extubation tolerate procedure well. Bilateral Breath Sounds: Clear, Diminished   No  PT was terminally extubated per MD order.   Merlene Laughter 06/02/2023, 9:52 AM

## 2023-07-03 NOTE — TOC CM/SW Note (Signed)
Transition of Care [facility]) - Inpatient Brief Assessment   Patient Details  Name: Ryan Garcia MRN: [id number] Date of Birth: 1933-11-15  Transition of Care [facility]) CM/SW Contact:    Mearl Latin, LCSW Phone Number: 06/16/2023, 9:50 AM   Clinical Narrative: Patient admitted from home with spouse and is currently intubated. TOC following for potential therapy needs at discharge.    Transition of Care Asessment: Insurance and Status: Insurance coverage has been reviewed Patient has primary care physician: Yes Home environment has been reviewed: From home Prior level of function:: Mod Indep Prior/Current Home Services: No current home services Social Drivers of Health Review: SDOH reviewed no interventions necessary Readmission risk has been reviewed: Yes Transition of care needs: transition of care needs identified, TOC will continue to follow

## 2023-07-03 NOTE — Death Summary Note (Cosign Needed)
Patient ID: Ryan Garcia MRN: [id number] DOB/AGE: June 19, 1934 89 y.o.  Admit date: July 15, 2023 Death date: July 15, 2023  Admission Diagnoses: Right basal ganglia ICH with extension into the midbrain and IVH  Cause of Death: Right basal ganglia ICH  Pertinent Medical Diagnosis: Principal Problem:   ICH (intracerebral hemorrhage) (HCC) Active Problems:   Intracranial hemorrhage [facility])   Hospital Course: 89 year old patient with history of A-fib on Eliquis and CAD on Plavix, diabetes, hypertension, MI, basal cell carcinoma status post recent Mohs surgery presented with poor responsiveness and left-sided weakness.  He was found to have a large right basal ganglia ICH with extension into the midbrain and IVH.  He was intubated for airway protection in the ED.  After discussion of goals of care with family, decision was made to transition patient to comfort care and proceed with compassionate extubation in accordance with patient's previously stated wishes.  Comfort care was initiated, and patient was extubated.  The time of death was 1616.  Signed: Marjorie Smolder July 15, 2023, 4:30 PM

## 2023-07-03 NOTE — Progress Notes (Signed)
   06/14/2023 0445  Spiritual Encounters  Type of Visit Initial  Care provided to: Family  Referral source Nurse (RN/NT/LPN)  Reason for visit Urgent spiritual support  OnCall Visit No   Chaplain responded to a call for support of the family. The patient Ryan Garcia and his wife, Ryan Garcia of sixty five plus years have has good life as she shared their story. She said that they had a nice evening together with their grandson. Ryan Garcia also shared that her daughter and a grand daughter were on their way in to the the hospital. She was distraught because she suspects what is coming and she is not wanting to lose her husband. Ryan Garcia said he would not want to be hooked up to machines. She remarked that he told the doctor when he had a procedure that all those machines being hooked to an individual is not living.  When additional family began to arrive I stepped away so they could spent time together and be able to say good bye to their loved one. I advised them is they wanted me to return to let their nurse know.   Valerie Roys [facility]  [phone]

## 2023-07-03 NOTE — Consult Note (Signed)
NAME:  Ryan Garcia, MRN:  [id number], DOB:  Nov 22, 1933, LOS: 0 ADMISSION DATE:  06/09/2023 CONSULTATION DATE:  06/13/2023 REFERRING MD:  Derry Lory - Neuro CHIEF COMPLAINT:  Code Stroke   History of Present Illness:  89 year old man who presented to [facility] ED 12/17 as a Code Stroke. LKW 2330 12/16. PMHx significant for HTN, HLD, CAD, NSTEMI (s/p DES to RCA 2018 on Plavix), PAF (on Eliquis), OSA (on CPAP), T2DM, CKD, BPH, GERD, basal cell skin CA (s/p Mohs), OA.  Patient is intubated, therefore history is obtained from chart review. Per report, patient went to bed at 2330 (LKW) with slight HA, he woke his wife around 0150 and appeared altered with inability to speak and L arm weakness. Again stated his head hurt, no trauma/fall or injury to patient's head. EMS called.  On arrival to ED, patient was increasingly less responsive with dilated pupils and no gag reflex. Intubated for airway protection. CT Head was completed and demonstrated large R basal ganglia hemorrhage extending down into the midbrain bilaterally (65mL ICH) with intraventricular extension.  PCCM consulted for vent management.  Pertinent Medical History:   Past Medical History:  Diagnosis Date   AC (acromioclavicular) joint bone spurs    lt ankle   Allergy    allergic rhinitis   Anginal pain (HCC)    Arthritis    OA   BPH (benign prostatic hyperplasia)    microwave tx of prostate   CAD (coronary artery disease)    a. s/p cath in 2012 showing 70-75% LAD stenosis, 75% D1 stenosis, 75% OM1, 60-70% RCA stenosis, and 80% PDA --> medical therapy pursued b. similar findings by cath in 2016; 06/2017 DES to distal RCA   Chronic kidney disease    Chronic upper back pain    Diabetes mellitus    type II x8 years   GERD (gastroesophageal reflux disease)    HLD (hyperlipidemia)    10/12: TC 208, TG 213, HDL 36, LDL 129   Hx of skin cancer, basal cell    many skin cancers removed/under constant treatment.   Hypertension     NSTEMI (non-ST elevated myocardial infarction) (HCC) 06/18/2017   DES to distal RCA   Obstructive sleep apnea on CPAP    Paroxysmal atrial flutter (HCC)    Renal mass    Scoliosis    Significant Hospital Events: Including procedures, antibiotic start and stop dates in addition to other pertinent events   12/17 - Presented as Code Stroke with decreased responsiveness/L-sided deficits. Intubated for airway protection. CT Head with large R basal ganglia ICH (65mL). ICH score 4.  Interim History / Subjective:  PCCM consulted for assistance with vent management.  Objective:  Blood pressure (!) 173/68, pulse 72, temperature (!) 97.5 F (36.4 C), temperature source Axillary, resp. rate 14, SpO2 100%.    Vent Mode: PRVC FiO2 (%):  [100 %] 100 % Set Rate:  [18 bmp] 18 bmp Vt Set:  [600 mL] 600 mL PEEP:  [5 cmH20] 5 cmH20 Plateau Pressure:  [15 cmH20] 15 cmH20   Intake/Output Summary (Last 24 hours) at 06/13/2023 0535 Last data filed at 06/20/2023 0408 Gross per 24 hour  Intake 13.59 ml  Output --  Net 13.59 ml   There were no vitals filed for this visit.  Physical Examination: General: Acutely ill-appearing elderly man in NAD. HEENT: [place]/AT, anicteric sclera, PERRL, moist mucous membranes. Facial/periorbital bruising noted bilaterally. Neuro:  Intubated, sedated.  Does not respond to verbal, tactile or noxious stimuli. Not  following commands. Intermittent twitching of BUE/BLE noted, no purposeful movement.  CV: RRR, no m/g/r. PULM: Breathing even and unlabored on vent. Lung fields CTAB. GI: Soft, nontender, nondistended. Normoactive bowel sounds. Extremities: No significant LE edema noted. Skin: Warm/dry, no rashes, facial bruising as above.  Resolved Hospital Problem List:    Assessment & Plan:  Large R basal ganglia/thalamic and midbrain ICH with intraventricular extension Presented as Code Stroke. CT Head 12/17 demonstrated large R basal ganglia hemorrhage extending down  into the midbrain bilaterally (65mL ICH) with intraventricular extension. ICH score 4. - Stroke team primary - Andexxa for Eliquis reversal, hold further AC/antiplatelet agents - Goal SBP 130-150 - Cleviprex titrated to goal SBP - HTS 3% bolus + rate 90mL/hr per Neuro - F/u repeat CT Head at 6 hours post-initial CT - Frequent neuro checks - Neuroprotective measures: HOB > 30 degrees, normoglycemia, normothermia, electrolytes WNL - PT/OT/SLP when able to participate in care - Poor prognosis; family is aware of this with multiple members at bedside awaiting additional family visitation prior to transition to comfort care  Acute hypoxemic respiratory failure OSA on CPAP - Continue full vent support (4-8cc/kg IBW) - Wean FiO2 for O2 sat > 90% - Daily WUA/SBT when appropriate, not currently appropriate from a neurologic standpoint - VAP bundle - Pulmonary hygiene - PAD protocol for sedation: Propofol and Fentanyl for goal RASS 0 to -1 - Recommend compassionate extubation once family has been able to visit  HTN HLD CAD NSTEMI (s/p DES to RCA 2018 on Plavix) PAF (on Eliquis) Paroxysmal Afib - Cardiac monitoring - Optimize electrolytes for K > 4, Mg > 2 - Hold home AC/antiplatelet agents  T2DM - No SSI/CBGs per family request, pending transition to comfort care  CKD BPH - Monitor I&Os - Avoid nephrotoxic agents as able - Ensure adequate renal perfusion  GERD - PPI  GOC - DNR/DNI per patient's wishes - Unfortunately nonsurvivable injury per Neuro team; family is aware of this. Awaiting additional family members to visit prior to transition to comfort care. - Avoid labs/CBGs/discomfort-causing interventions  Best Practice: (right click and "Reselect all SmartList Selections" daily)   Per Primary Team  Labs:  CBC: Recent Labs  Lab 06/14/23 1715 06/23/2023 0304 06/04/2023 0315  WBC 7.0  --  7.3  NEUTROABS  --   --  4.4  HGB 11.4* 12.9* 12.2*  HCT 37.1* 38.0* 39.6  MCV  88.1  --  87.4  PLT 207  --  204    Basic Metabolic Panel: Recent Labs  Lab 06/14/23 1715 06/17/2023 0304 06/02/2023 0315  NA 139 143 142  K 4.0 3.8 3.8  CL 107 103 105  CO2 23  --  23  GLUCOSE 215* 108* 113*  BUN 17 18 16   CREATININE 1.12 1.00 0.77  CALCIUM 8.8*  --  9.3   GFR: Estimated Creatinine Clearance: 80.7 mL/min (by C-G formula based on SCr of 0.77 mg/dL). Recent Labs  Lab 06/14/23 1715 06/25/2023 0315  WBC 7.0 7.3   Liver Function Tests: Recent Labs  Lab 06/16/2023 0315  AST 20  ALT 37  ALKPHOS 104  BILITOT 0.8  PROT 7.0  ALBUMIN 3.3*   No results for input(s): "LIPASE", "AMYLASE" in the last 168 hours. No results for input(s): "AMMONIA" in the last 168 hours.  ABG:    Component Value Date/Time   TCO2 26 06/19/2023 0304    Coagulation Profile: Recent Labs  Lab 06/29/2023 0315  INR 1.4*   Cardiac Enzymes: No results  for input(s): "CKTOTAL", "CKMB", "CKMBINDEX", "TROPONINI" in the last 168 hours.  HbA1C: Hgb A1c MFr Bld  Date/Time Value Ref Range Status  03/23/2023 03:38 AM 7.7 (H) 4.8 - 5.6 % Final    Comment:    (NOTE) Pre diabetes:          5.7%-6.4%  Diabetes:              >6.4%  Glycemic control for   <7.0% adults with diabetes   07/31/2022 04:47 AM 7.8 (H) 4.8 - 5.6 % Final    Comment:    (NOTE) Pre diabetes:          5.7%-6.4%  Diabetes:              >6.4%  Glycemic control for   <7.0% adults with diabetes    CBG: Recent Labs  Lab 07/02/2023 0301  GLUCAP 96   Review of Systems:   Patient is encephalopathic and/or intubated; therefore, history has been obtained from chart review.   Past Medical History:  He,  has a past medical history of AC (acromioclavicular) joint bone spurs, Allergy, Anginal pain (HCC), Arthritis, BPH (benign prostatic hyperplasia), CAD (coronary artery disease), Chronic kidney disease, Chronic upper back pain, Diabetes mellitus, GERD (gastroesophageal reflux disease), HLD (hyperlipidemia), skin cancer,  basal cell, Hypertension, NSTEMI (non-ST elevated myocardial infarction) (HCC) (06/18/2017), Obstructive sleep apnea on CPAP, Paroxysmal atrial flutter (HCC), Renal mass, and Scoliosis.   Surgical History:   Past Surgical History:  Procedure Laterality Date   BIOPSY  08/01/2022   Procedure: BIOPSY;  Surgeon: Beverley Fiedler, MD;  Location: [facility] ENDOSCOPY;  Service: Gastroenterology;;   BREAST SURGERY  2009   breast lump removed benign   BUBBLE STUDY  10/14/2020   Procedure: BUBBLE STUDY;  Surgeon: Parke Poisson, MD;  Location: [facility] ENDOSCOPY;  Service: Cardiovascular;;   CARDIAC CATHETERIZATION N/A 03/15/2015   Procedure: Left Heart Cath and Coronary Angiography;  Surgeon: Lyn Records, MD;  Location: [facility] INVASIVE CV LAB;  Service: Cardiovascular;  Laterality: N/A;   CARDIOVERSION N/A 10/14/2020   Procedure: CARDIOVERSION;  Surgeon: Parke Poisson, MD;  Location: [facility] ENDOSCOPY;  Service: Cardiovascular;  Laterality: N/A;   CATARACT EXTRACTION W/PHACO Right 11/29/2021   Procedure: CATARACT EXTRACTION PHACO AND INTRAOCULAR LENS PLACEMENT (IOC) RIGHT DIABETIC;  Surgeon: Lockie Mola, MD;  Location: [facility];  Service: Ophthalmology;  Laterality: Right;  Diabetic 18.72 01:54.3   COLONOSCOPY N/A 08/01/2022   Procedure: COLONOSCOPY;  Surgeon: Beverley Fiedler, MD;  Location: [facility] ENDOSCOPY;  Service: Gastroenterology;  Laterality: N/A;   CORONARY STENT INTERVENTION N/A 06/18/2017   Procedure: CORONARY STENT INTERVENTION;  Surgeon: Corky Crafts, MD;  Location: [facility] INVASIVE CV LAB;  Service: Cardiovascular;  Laterality: N/A;  RCA   CORONARY/GRAFT ACUTE MI REVASCULARIZATION N/A 08/17/2022   Procedure: Coronary/Graft Acute MI Revascularization;  Surgeon: Alwyn Pea, MD;  Location: [facility] INVASIVE CV LAB;  Service: Cardiovascular;  Laterality: N/A;   ESOPHAGOGASTRODUODENOSCOPY (EGD) WITH PROPOFOL N/A 08/01/2022   Procedure: ESOPHAGOGASTRODUODENOSCOPY (EGD) WITH PROPOFOL;   Surgeon: Beverley Fiedler, MD;  Location: [facility] ENDOSCOPY;  Service: Gastroenterology;  Laterality: N/A;   HEMOSTASIS CLIP PLACEMENT  08/01/2022   Procedure: HEMOSTASIS CLIP PLACEMENT;  Surgeon: Beverley Fiedler, MD;  Location: [facility] ENDOSCOPY;  Service: Gastroenterology;;   LAPAROSCOPIC LYSIS OF ADHESIONS N/A 08/03/2022   Procedure: LAPAROSCOPIC LYSIS OF ADHESIONS;  Surgeon: Griselda Miner, MD;  Location: [facility] OR;  Service: General;  Laterality: N/A;   LAPAROSCOPIC PARTIAL COLECTOMY N/A 08/03/2022  Procedure: OPEN PARTIAL COLECTOMY;  Surgeon: Griselda Miner, MD;  Location: [facility] OR;  Service: General;  Laterality: N/A;   LEFT HEART CATH AND CORONARY ANGIOGRAPHY N/A 06/18/2017   Procedure: LEFT HEART CATH AND CORONARY ANGIOGRAPHY;  Surgeon: Corky Crafts, MD;  Location: [facility] INVASIVE CV LAB;  Service: Cardiovascular;  Laterality: N/A;   LEFT HEART CATH AND CORONARY ANGIOGRAPHY N/A 04/12/2020   Procedure: LEFT HEART CATH AND CORONARY ANGIOGRAPHY;  Surgeon: Swaz[place], Peter M, MD;  Location: [facility] INVASIVE CV LAB;  Service: Cardiovascular;  Laterality: N/A;   LEFT HEART CATH AND CORONARY ANGIOGRAPHY N/A 08/17/2022   Procedure: LEFT HEART CATH AND CORONARY ANGIOGRAPHY;  Surgeon: Alwyn Pea, MD;  Location: [facility] INVASIVE CV LAB;  Service: Cardiovascular;  Laterality: N/A;   POLYPECTOMY  08/01/2022   Procedure: POLYPECTOMY;  Surgeon: Beverley Fiedler, MD;  Location: [facility] ENDOSCOPY;  Service: Gastroenterology;;   SUBMUCOSAL TATTOO INJECTION  08/01/2022   Procedure: SUBMUCOSAL TATTOO INJECTION;  Surgeon: Beverley Fiedler, MD;  Location: [facility] ENDOSCOPY;  Service: Gastroenterology;;   TEE WITHOUT CARDIOVERSION N/A 10/14/2020   Procedure: TRANSESOPHAGEAL ECHOCARDIOGRAM (TEE);  Surgeon: Parke Poisson, MD;  Location: [facility] ENDOSCOPY;  Service: Cardiovascular;  Laterality: N/A;   ULTRASOUND GUIDANCE FOR VASCULAR ACCESS  06/18/2017   Procedure: Ultrasound Guidance For Vascular Access;  Surgeon: Corky Crafts, MD;  Location: [facility]  INVASIVE CV LAB;  Service: Cardiovascular;;  right radial, right femoral   Social History:   reports that he has never smoked. He has never been exposed to tobacco smoke. He has never used smokeless tobacco. He reports that he does not drink alcohol and does not use drugs.   Family History:  His family history includes Heart attack in his brother. There is no history of Colon cancer, Colon polyps, Stomach cancer, or Esophageal cancer.   Allergies: Allergies  Allergen Reactions   Loratadine Other (See Comments)    Not effective   Simvastatin Other (See Comments)    Intolerant- caused joint pain    Home Medications: Prior to Admission medications   Medication Sig Start Date End Date Taking? Authorizing Provider  Multiple Vitamins-Minerals (PRESERVISION AREDS 2 PO) Take 1 tablet by mouth daily.   Yes [provider]  acetaminophen (TYLENOL) 325 MG tablet Take 325-650 mg by mouth every 6 (six) hours as needed for mild pain or headache.    [provider]  amLODipine (NORVASC) 5 MG tablet Take 5 mg by mouth daily. 04/04/23   [provider]  apixaban (ELIQUIS) 5 MG TABS tablet TAKE 1 TABLET TWICE DAILY 11/28/22   Lewayne Bunting, MD  cetirizine (ZYRTEC) 10 MG tablet Take 10 mg by mouth daily.    [provider]  clopidogrel (PLAVIX) 75 MG tablet Take 1 tablet (75 mg total) by mouth daily. 09/12/22   Tower, Audrie Gallus, MD  Coenzyme Q10 (CO Q 10 PO) Take 200 mg by mouth daily.    [provider]  Cyanocobalamin (VITAMIN B12) 1000 MCG TBCR Take 1,000 mcg by mouth daily. 04/12/20   Arty Baumgartner, NP  furosemide (LASIX) 20 MG tablet TAKE 1 TABLET EVERY DAY 12/31/22   Lewayne Bunting, MD  insulin glargine (LANTUS SOLOSTAR) 100 UNIT/ML Solostar Pen Inject 38 Units into the skin daily.    [provider]  isosorbide mononitrate (IMDUR) 30 MG 24 hr tablet Take 3 tablets (90 mg total) by mouth daily. 12/23/22   Enedina Finner, MD  metFORMIN  (GLUCOPHAGE) 1000 MG tablet Take 1  tablet (1,000 mg total) by mouth 2 (two) times daily with a meal. 01/21/23   Eden Emms, NP  metoprolol tartrate (LOPRESSOR) 25 MG tablet TAKE 1/2 TABLET TWICE DAILY 12/20/22   Lewayne Bunting, MD  nitroGLYCERIN (NITROSTAT) 0.4 MG SL tablet Place 1 tablet (0.4 mg total) under the tongue every 5 (five) minutes as needed for chest pain. 05/11/19   [facility]uke, Roe Rutherford, PA  NOVOLOG FLEXPEN 100 UNIT/ML FlexPen Inject into the skin every evening. 8 units in am 16 units in pm 03/15/23   [provider]  rosuvastatin (CRESTOR) 40 MG tablet TAKE 1 TABLET EVERY DAY 09/14/22   Lewayne Bunting, MD    Critical care time:    The patient is critically ill with multiple organ system failure and requires high complexity decision making for assessment and support, frequent evaluation and titration of therapies, advanced monitoring, review of radiographic studies and interpretation of complex data.   Critical Care Time devoted to patient care services, exclusive of separately billable procedures, described in this note is 35 minutes.  Tim Lair, PA-C [facility] 06/07/2023 5:35 AM  Please see [email or web address] for pager details.  From 7A-7P if no response, please call [phone] After hours, please call ELink [phone]

## 2023-07-03 NOTE — Progress Notes (Signed)
SLP Cancellation Note  Patient Details Name: Ryan Garcia MRN: [id number] DOB: [birth year]   Cancelled treatment:       Reason Eval/Treat Not Completed: Patient not medically ready. Pt on vent, will f/u.    Makynleigh Breslin, Riley Nearing 06/15/2023, 9:20 AM

## 2023-07-03 NOTE — ED Triage Notes (Signed)
Patient BIB [facility] from home for stroke. Patient went to sleep at 2330 and reported a slight headache. Patient woke wife up at 0150 and per wife wasn't acting right. Patient pupils not reactive. Patient not speaking. Left arm weakness.

## 2023-07-03 NOTE — Sedation Documentation (Signed)
 7.5/23 at the lip

## 2023-07-03 NOTE — ED Notes (Signed)
At bridge patient not opening eyes, pupils not reacting, absent of gag reflex. Patient taken straight to  ED room with agreement between EDP and neuro.

## 2023-07-03 NOTE — H&P (Signed)
NEUROLOGY CONSULT NOTE   Date of service: June 18, 2023 Patient Name: Ryan Garcia MRN:  [id number] DOB:  07-30-1933 Chief Complaint: "somnolent, non verbal, posturing on the left" Requesting Provider: Zadie Rhine, MD  History of Present Illness  Ryan Garcia is a 89 y.o. male with afibb and CAD on eliquis and plavix, hx of DM2, HTN, STEMI, basal cell cancer s/p mohs on 06/13/23. He is brought in by EMS for poor responsiveness, left sided weakness.  He took his eliquis and plavix yesterday around 1700. He went to bed at 2315. Somewhat unusual for him since he goes to bed around midnight. In the middle of the night, he was ruffling in the bed and wife woke up. He would not talk to her. Wife noticed he was trying to turn in the bed but could not move his left arm. He did tell her that his head hurts but was unable to say anything else. Wife realized something was wrong and turned on the lights and attempted to wake him up. she called EMS as he seemed very off. He did not have a fall or head injury.  On arrival, he has decreased responsiveness, pupils dilated and unreactive with no gag.  He was intubated prior to CT Head. CT head demonstrated large R BG ICH extending down into his midbrain BL. 65cc of ICH with intraventricular extension.  LKW: 2315 Modified rankin score: 0-Completely asymptomatic and back to baseline post- stroke IV Thrombolysis: not offered 2/2 ICH EVT: not offered 2/2 ICH ICH Score:4 (Age > 80, vol > 30, Intraventricular extension, GCS 5-12 on arrival)  NIHSS components Score: Comment  1a Level of Conscious 0[]  1[]  2[]  3[x]      1b LOC Questions 0[]  1[]  2[x]       1c LOC Commands 0[]  1[]  2[x]       2 Best Gaze 0[x]  1[]  2[]       3 Visual 0[]  1[]  2[]  3[x]      4 Facial Palsy 0[x]  1[]  2[]  3[]      5a Motor Arm - left 0[]  1[]  2[]  3[]  4[x]  UN[]    5b Motor Arm - Right 0[]  1[x]  2[]  3[]  4[]  UN[]    6a Motor Leg - Left 0[]  1[]  2[]  3[]  4[x]  UN[]    6b Motor Leg - Right 0[]   1[x]  2[]  3[]  4[]  UN[]    7 Limb Ataxia 0[]  1[]  2[]  3[x]  UN[]     8 Sensory 0[]  1[]  2[x]  UN[]      9 Best Language 0[]  1[]  2[]  3[x]      10 Dysarthria 0[]  1[]  2[x]  UN[]      11 Extinct. and Inattention 0[x]  1[]  2[]       TOTAL:       ROS  Unable to ascertain due to poor responsiveness.  Past History   Past Medical History:  Diagnosis Date   AC (acromioclavicular) joint bone spurs    lt ankle   Allergy    allergic rhinitis   Anginal pain (HCC)    Arthritis    OA   BPH (benign prostatic hyperplasia)    microwave tx of prostate   CAD (coronary artery disease)    a. s/p cath in 2012 showing 70-75% LAD stenosis, 75% D1 stenosis, 75% OM1, 60-70% RCA stenosis, and 80% PDA --> medical therapy pursued b. similar findings by cath in 2016; 06/2017 DES to distal RCA   Chronic kidney disease    Chronic upper back pain    Diabetes mellitus    type II x8 years   GERD (  gastroesophageal reflux disease)    HLD (hyperlipidemia)    10/12: TC 208, TG 213, HDL 36, LDL 129   Hx of skin cancer, basal cell    many skin cancers removed/under constant treatment.   Hypertension    NSTEMI (non-ST elevated myocardial infarction) (HCC) 06/18/2017   DES to distal RCA   Obstructive sleep apnea on CPAP    Paroxysmal atrial flutter (HCC)    Renal mass    Scoliosis     Past Surgical History:  Procedure Laterality Date   BIOPSY  08/01/2022   Procedure: BIOPSY;  Surgeon: Beverley Fiedler, MD;  Location: [facility] ENDOSCOPY;  Service: Gastroenterology;;   BREAST SURGERY  2009   breast lump removed benign   BUBBLE STUDY  10/14/2020   Procedure: BUBBLE STUDY;  Surgeon: Parke Poisson, MD;  Location: [facility] ENDOSCOPY;  Service: Cardiovascular;;   CARDIAC CATHETERIZATION N/A 03/15/2015   Procedure: Left Heart Cath and Coronary Angiography;  Surgeon: Lyn Records, MD;  Location: [facility] INVASIVE CV LAB;  Service: Cardiovascular;  Laterality: N/A;   CARDIOVERSION N/A 10/14/2020   Procedure: CARDIOVERSION;  Surgeon: Parke Poisson, MD;  Location: [facility] ENDOSCOPY;  Service: Cardiovascular;  Laterality: N/A;   CATARACT EXTRACTION W/PHACO Right 11/29/2021   Procedure: CATARACT EXTRACTION PHACO AND INTRAOCULAR LENS PLACEMENT (IOC) RIGHT DIABETIC;  Surgeon: Lockie Mola, MD;  Location: [facility] SURGERY CNTR;  Service: Ophthalmology;  Laterality: Right;  Diabetic 18.72 01:54.3   COLONOSCOPY N/A 08/01/2022   Procedure: COLONOSCOPY;  Surgeon: Beverley Fiedler, MD;  Location: [facility] ENDOSCOPY;  Service: Gastroenterology;  Laterality: N/A;   CORONARY STENT INTERVENTION N/A 06/18/2017   Procedure: CORONARY STENT INTERVENTION;  Surgeon: Corky Crafts, MD;  Location: [facility] INVASIVE CV LAB;  Service: Cardiovascular;  Laterality: N/A;  RCA   CORONARY/GRAFT ACUTE MI REVASCULARIZATION N/A 08/17/2022   Procedure: Coronary/Graft Acute MI Revascularization;  Surgeon: Alwyn Pea, MD;  Location: [facility] INVASIVE CV LAB;  Service: Cardiovascular;  Laterality: N/A;   ESOPHAGOGASTRODUODENOSCOPY (EGD) WITH PROPOFOL N/A 08/01/2022   Procedure: ESOPHAGOGASTRODUODENOSCOPY (EGD) WITH PROPOFOL;  Surgeon: Beverley Fiedler, MD;  Location: [facility] ENDOSCOPY;  Service: Gastroenterology;  Laterality: N/A;   HEMOSTASIS CLIP PLACEMENT  08/01/2022   Procedure: HEMOSTASIS CLIP PLACEMENT;  Surgeon: Beverley Fiedler, MD;  Location: [facility] ENDOSCOPY;  Service: Gastroenterology;;   LAPAROSCOPIC LYSIS OF ADHESIONS N/A 08/03/2022   Procedure: LAPAROSCOPIC LYSIS OF ADHESIONS;  Surgeon: Griselda Miner, MD;  Location: [facility] OR;  Service: General;  Laterality: N/A;   LAPAROSCOPIC PARTIAL COLECTOMY N/A 08/03/2022   Procedure: OPEN PARTIAL COLECTOMY;  Surgeon: Griselda Miner, MD;  Location: [facility] OR;  Service: General;  Laterality: N/A;   LEFT HEART CATH AND CORONARY ANGIOGRAPHY N/A 06/18/2017   Procedure: LEFT HEART CATH AND CORONARY ANGIOGRAPHY;  Surgeon: Corky Crafts, MD;  Location: [facility] INVASIVE CV LAB;  Service: Cardiovascular;  Laterality: N/A;   LEFT HEART CATH AND CORONARY  ANGIOGRAPHY N/A 04/12/2020   Procedure: LEFT HEART CATH AND CORONARY ANGIOGRAPHY;  Surgeon: Swaziland, Peter M, MD;  Location: [facility] INVASIVE CV LAB;  Service: Cardiovascular;  Laterality: N/A;   LEFT HEART CATH AND CORONARY ANGIOGRAPHY N/A 08/17/2022   Procedure: LEFT HEART CATH AND CORONARY ANGIOGRAPHY;  Surgeon: Alwyn Pea, MD;  Location: [facility] INVASIVE CV LAB;  Service: Cardiovascular;  Laterality: N/A;   POLYPECTOMY  08/01/2022   Procedure: POLYPECTOMY;  Surgeon: Beverley Fiedler, MD;  Location: [facility] ENDOSCOPY;  Service: Gastroenterology;;   SUBMUCOSAL TATTOO INJECTION  08/01/2022   Procedure: Sunnie Nielsen  TATTOO INJECTION;  Surgeon: Beverley Fiedler, MD;  Location: [facility] ENDOSCOPY;  Service: Gastroenterology;;   TEE WITHOUT CARDIOVERSION N/A 10/14/2020   Procedure: TRANSESOPHAGEAL ECHOCARDIOGRAM (TEE);  Surgeon: Parke Poisson, MD;  Location: [facility] ENDOSCOPY;  Service: Cardiovascular;  Laterality: N/A;   ULTRASOUND GUIDANCE FOR VASCULAR ACCESS  06/18/2017   Procedure: Ultrasound Guidance For Vascular Access;  Surgeon: Corky Crafts, MD;  Location: [facility] INVASIVE CV LAB;  Service: Cardiovascular;;  right radial, right femoral    Family History: Family History  Problem Relation Age of Onset   Heart attack Brother    Colon cancer Neg Hx    Colon polyps Neg Hx    Stomach cancer Neg Hx    Esophageal cancer Neg Hx     Social History  reports that he has never smoked. He has never been exposed to tobacco smoke. He has never used smokeless tobacco. He reports that he does not drink alcohol and does not use drugs.  Allergies  Allergen Reactions   Loratadine Other (See Comments)    Not effective   Simvastatin Other (See Comments)    Intolerant- caused joint pain    Medications  No current facility-administered medications for this encounter.  Current Outpatient Medications:    acetaminophen (TYLENOL) 325 MG tablet, Take 325-650 mg by mouth every 6 (six) hours as needed for mild pain or  headache., Disp: , Rfl:    Alcohol Swabs (B-D SINGLE USE SWABS REGULAR) PADS, Use to check blood sugar 2 times a day, Disp: 200 each, Rfl: 3   amLODipine (NORVASC) 5 MG tablet, Take 5 mg by mouth daily., Disp: , Rfl:    apixaban (ELIQUIS) 5 MG TABS tablet, TAKE 1 TABLET TWICE DAILY, Disp: 180 tablet, Rfl: 1   Blood Glucose Calibration (TRUE METRIX LEVEL 1) Low SOLN, Use to check control on glucose meter, Disp: 1 each, Rfl: 3   Blood Glucose Monitoring Suppl (TRUE METRIX AIR GLUCOSE METER) w/Device KIT, 1 kit by Other route 2 (two) times daily. Check blood sugar twice daily and as directed. Dx E11.65, Disp: 1 kit, Rfl: 0   cetirizine (ZYRTEC) 10 MG tablet, Take 10 mg by mouth daily., Disp: , Rfl:    clopidogrel (PLAVIX) 75 MG tablet, Take 1 tablet (75 mg total) by mouth daily., Disp: 90 tablet, Rfl: 3   Coenzyme Q10 (CO Q 10 PO), Take 200 mg by mouth daily., Disp: , Rfl:    Continuous Glucose Receiver (FREESTYLE LIBRE 3 READER) DEVI, , Disp: , Rfl:    Continuous Glucose Sensor (FREESTYLE LIBRE 3 SENSOR) MISC, USE 1 SENSOR EVERY 14 DAYS, Disp: , Rfl:    Cyanocobalamin (VITAMIN B12) 1000 MCG TBCR, Take 1,000 mcg by mouth daily., Disp: 30 tablet, Rfl:    DROPLET PEN NEEDLES 31G X 6 MM MISC, USE ONE TIME DAILY  AT  BEDTIME  WITH  LANTUS, Disp: 100 each, Rfl: 3   furosemide (LASIX) 20 MG tablet, TAKE 1 TABLET EVERY DAY, Disp: 90 tablet, Rfl: 3   insulin glargine (LANTUS SOLOSTAR) 100 UNIT/ML Solostar Pen, Inject 38 Units into the skin daily., Disp: , Rfl:    isosorbide mononitrate (IMDUR) 30 MG 24 hr tablet, Take 3 tablets (90 mg total) by mouth daily., Disp: 30 tablet, Rfl: 0   metFORMIN (GLUCOPHAGE) 1000 MG tablet, Take 1 tablet (1,000 mg total) by mouth 2 (two) times daily with a meal., Disp: 180 tablet, Rfl: 2   metoprolol tartrate (LOPRESSOR) 25 MG tablet, TAKE 1/2 TABLET TWICE DAILY, Disp:  90 tablet, Rfl: 3   Multiple Vitamins-Minerals (ICAPS AREDS 2 PO), Take 1 Capful by mouth daily., Disp: , Rfl:     nitroGLYCERIN (NITROSTAT) 0.4 MG SL tablet, Place 1 tablet (0.4 mg total) under the tongue every 5 (five) minutes as needed for chest pain., Disp: 25 tablet, Rfl: 3   NOVOLOG FLEXPEN 100 UNIT/ML FlexPen, Inject into the skin every evening. 8 units in am 16 units in pm, Disp: , Rfl:    rosuvastatin (CRESTOR) 40 MG tablet, TAKE 1 TABLET EVERY DAY, Disp: 90 tablet, Rfl: 3   TRUE METRIX BLOOD GLUCOSE TEST test strip, CHECK BLOOD SUGAR TWICE DAILY, Disp: 200 strip, Rfl: 3   TRUEplus Lancets 30G MISC, Check blood sugar twice daily and as directed. Dx E11.65, Disp: 200 each, Rfl: 3  Vitals  There were no vitals filed for this visit.  There is no height or weight on file to calculate BMI.  Physical Exam   General: Laying comfortably in bed; in no acute distress. HENT: Normal oropharynx and mucosa. Normal external appearance of ears and nose. Gauze wrap over left forehead after recent mohs, dark bruising around both eyes and under his chin. Neck: Supple, no pain or tenderness  CV: No JVD. No peripheral edema.  Pulmonary: Symmetric Chest rise. Normal respiratory effort.  Abdomen: Soft to touch, non-tender.  Ext: No cyanosis, edema, or deformity  Skin: No rash. Normal palpation of skin.   Musculoskeletal: Normal digits and nails by inspection. No clubbing.   Neurologic Examination  Mental status/Cognition: obtunded, mute, does not answer orientation questions or follow commands. Speech/language: mute, does not answer orientation questions. Cranial nerves:   CN II Pupils dilated and unreactive to light   CN III,IV,VI Midgze with slightly dysconjugate. Dolls eyes reflex is absent   CN V Corneals + BL   CN VII L facial droop noted on facial grimace   CN VIII Does not open eyes or turn head towards speech   CN IX & X No gag, unable to attempt cough before being intubated.   CN XI Head is midline   CN XII midline tongue protrusion   Sensory/Motor:  Muscle bulk: normal, tone increased in left  upper ext Some spontaneous and puposeful movements noted in RUE and RLE. Extensor posturing noted in left upper and triple flexion noted in left lower ext to proximal pinch.  Coordination/Complex Motor:  - Unable to assess.  Labs/Imaging/Neurodiagnostic studies   CBC:  Recent Labs  Lab 06/30/23 1715 06/17/2023 0304  WBC 7.0  --   HGB 11.4* 12.9*  HCT 37.1* 38.0*  MCV 88.1  --   PLT 207  --    Basic Metabolic Panel:  Lab Results  Component Value Date   NA 143 06/30/2023   K 3.8 06/25/2023   CO2 23 Jun 30, 2023   GLUCOSE 108 (H) 06/12/2023   BUN 18 06/28/2023   CREATININE 1.00 07/01/2023   CALCIUM 8.8 (L) Jun 30, 2023   GFRNONAA >60 2023/06/30   GFRAA >60 07/03/2018   Lipid Panel:  Lab Results  Component Value Date   LDLCALC 38 07/31/2022   HgbA1c:  Lab Results  Component Value Date   HGBA1C 7.7 (H) 03/23/2023   Urine Drug Screen: No results found for: "LABOPIA", "COCAINSCRNUR", "LABBENZ", "AMPHETMU", "THCU", "LABBARB"  Alcohol Level No results found for: "ETH" INR  Lab Results  Component Value Date   INR 1.4 (H) 03/22/2023   APTT  Lab Results  Component Value Date   APTT 46 (H) 03/22/2023  AED levels: No results found for: "PHENYTOIN", "ZONISAMIDE", "LAMOTRIGINE", "LEVETIRACETA"  CT Head without contrast(Personally reviewed): 1. Large intraparenchymal hematoma centered at the right thalamus and midbrain, measuring approximately 5.5 x 5.1 x 4.6 cm (65 mL). 2. Intraventricular extension of blood products with mild hydrocephalus of the lateral ventricles. 3. Leftward deviation of the third ventricle but no other midline shift. 4. Large left frontal scalp hematoma without skull fracture.  CT angio Head and Neck with contrast(Personally reviewed): 1. Large hematoma centered in the right thalamus and midbrain with two foci of probable active extravasation. 2. Actively bleeding left frontal scalp hematoma. 3. Bilateral V4 segment atherosclerosis with moderate  stenosis, right greater than left.  MRI Brain(Personally reviewed): Pending  ASSESSMENT   Ryan Garcia is a 89 y.o. male with afibb and CAD on eliquis and plavix, hx of DM2, HTN, STEMI, basal cell cancer s/p mohs on 06/13/23. He is brought in by EMS for poor responsiveness, left sided weakness. He was found to have large R BG hematoma in R thalamus and midbrain BL with intraventricular extension and an ICH score of 4.  His brief neuro exam just prior to intubation with unreactive pupils BL which were dilated and round, no gag, intact corneals BL with left sided weakness and some purposeful movement on the right.  Overall, with the noted bleeding in the midbrain, and noted patchy brainstem reflexes on arrival and a high ICH score, I do not think that this injury is survivable, specially given the crucial role of brainstem in maintaining basic functions of life.  I had discussion with both his wife and son at bedside and reviewed imaging findings. He is DNR/DNI. Will reverse his eliquis, blood pressure control and hypertonic saline. Wife and son waiting on immediate family members to come by in the morning to see patient before considering transition to comfrot care.  RECOMMENDATIONS  Acute large R thalamus and midbrain ICH with IVH and early hydrocephalus with brain compression and midline shift of brain with an ICH score of 4:  - Admit to ICU - Stability scan in 6 hours or STAT with any neurological decline - Frequent neuro checks; q23min for 1 hour, then q1hour - No antiplatelets or anticoagulants due to ICH - SCD for DVT prophylaxis, pharmacological DVT ppx at 24 hours if ICH is stable - Blood pressure control with goal systolic 130 - 150, cleverplex and labetalol PRN - Stroke labs, HgbA1c, fasting lipid panel - reverse eliquis with Andexxa. - Risk factor modification - Echocardiogram - PT consult, OT consult, Speech consult. - Hypertonic saline 250cc bolus, followed by 75cc/hr -  Stroke team to follow - unlikely to benefit from neurosurgical consult/intervention.  ______________________________________________________________________  This patient is critically ill and at significant risk of neurological worsening, death and care requires constant monitoring of vital signs, hemodynamics,respiratory and cardiac monitoring, neurological assessment, discussion with family, other specialists and medical decision making of high complexity. I spent 105 minutes of neurocritical care time  in the care of  this patient. This was time spent independent of any time provided by nurse practitioner or PA.  Erick Blinks [facility] 07/02/2023  5:35 AM  Signed, Erick Blinks, MD [facility]

## 2023-07-03 NOTE — Plan of Care (Signed)
Problem: Education: Goal: Knowledge of disease or condition will improve 06/23/2023 0935 by Sharion Settler, RN Outcome: Not Progressing 06/21/2023 0934 by Sharion Settler, RN Outcome: Not Progressing Goal: Knowledge of secondary prevention will improve (MUST DOCUMENT ALL) 06/26/2023 0935 by Sharion Settler, RN Outcome: Not Progressing 06/15/2023 0934 by Sharion Settler, RN Outcome: Not Progressing Goal: Knowledge of patient specific risk factors will improve Loraine Leriche N/A or DELETE if not current risk factor) 06/12/2023 0935 by Sharion Settler, RN Outcome: Not Progressing 06/25/2023 0934 by Sharion Settler, RN Outcome: Not Progressing   Problem: Intracerebral Hemorrhage Tissue Perfusion: Goal: Complications of Intracerebral Hemorrhage will be minimized 06/24/2023 0935 by Sharion Settler, RN Outcome: Not Progressing 06/03/2023 0934 by Sharion Settler, RN Outcome: Not Progressing   Problem: Coping: Goal: Will verbalize positive feelings about self 06/06/2023 0935 by Sharion Settler, RN Outcome: Not Progressing 06/04/2023 0934 by Sharion Settler, RN Outcome: Not Progressing Goal: Will identify appropriate support needs 06/04/2023 0935 by Sharion Settler, RN Outcome: Not Progressing 06/30/2023 0934 by Sharion Settler, RN Outcome: Not Progressing   Problem: Health Behavior/Discharge Planning: Goal: Ability to manage health-related needs will improve 06/14/2023 0935 by Sharion Settler, RN Outcome: Not Progressing 06/03/2023 0934 by Sharion Settler, RN Outcome: Not Progressing Goal: Goals will be collaboratively established with patient/family 06/17/2023 0935 by Sharion Settler, RN Outcome: Not Progressing 06/12/2023 0934 by Sharion Settler, RN Outcome: Not Progressing   Problem: Self-Care: Goal: Ability to participate in self-care as condition permits will improve 06/03/2023 0935 by Sharion Settler,  RN Outcome: Not Progressing 06/07/2023 0934 by Sharion Settler, RN Outcome: Not Progressing Goal: Verbalization of feelings and concerns over difficulty with self-care will improve 06/12/2023 0935 by Sharion Settler, RN Outcome: Not Progressing 06/26/2023 0934 by Sharion Settler, RN Outcome: Not Progressing Goal: Ability to communicate needs accurately will improve 06/02/2023 0935 by Sharion Settler, RN Outcome: Not Progressing 06/16/2023 0934 by Sharion Settler, RN Outcome: Not Progressing   Problem: Nutrition: Goal: Risk of aspiration will decrease 06/22/2023 0935 by Sharion Settler, RN Outcome: Not Progressing 07/02/2023 0934 by Sharion Settler, RN Outcome: Not Progressing Goal: Dietary intake will improve 06/22/2023 0935 by Sharion Settler, RN Outcome: Not Progressing 06/20/2023 0934 by Sharion Settler, RN Outcome: Not Progressing   Problem: Activity: Goal: Ability to tolerate increased activity will improve 06/20/2023 0935 by Sharion Settler, RN Outcome: Not Progressing 06/03/2023 0934 by Sharion Settler, RN Outcome: Not Progressing   Problem: Respiratory: Goal: Ability to maintain a clear airway and adequate ventilation will improve 06/16/2023 0935 by Sharion Settler, RN Outcome: Not Progressing 06/15/2023 0934 by Sharion Settler, RN Outcome: Not Progressing   Problem: Role Relationship: Goal: Method of communication will improve 06/02/2023 0935 by Sharion Settler, RN Outcome: Not Progressing 06/24/2023 0934 by Sharion Settler, RN Outcome: Not Progressing   Problem: Education: Goal: Ability to describe self-care measures that may prevent or decrease complications (Diabetes Survival Skills Education) will improve 06/28/2023 0935 by Sharion Settler, RN Outcome: Not Progressing 06/28/2023 0934 by Sharion Settler, RN Outcome: Not Progressing Goal: Individualized Educational Video(s) 06/02/2023  0935 by Sharion Settler, RN Outcome: Not Progressing 06/23/2023 0934 by Sharion Settler, RN Outcome: Not Progressing   Problem: Coping: Goal: Ability to adjust to condition or change in health will improve 06/05/2023 0935 by Sharion Settler, RN Outcome: Not Progressing 06/28/2023 0934 by Sheppard Penton  B, RN Outcome: Not Progressing   Problem: Fluid Volume: Goal: Ability to maintain a balanced intake and output will improve 06/29/2023 0935 by Sharion Settler, RN Outcome: Not Progressing 06/21/2023 0934 by Sharion Settler, RN Outcome: Not Progressing   Problem: Health Behavior/Discharge Planning: Goal: Ability to identify and utilize available resources and services will improve 06/11/2023 0935 by Sharion Settler, RN Outcome: Not Progressing 06/22/2023 0934 by Sharion Settler, RN Outcome: Not Progressing Goal: Ability to manage health-related needs will improve 06/04/2023 0935 by Sharion Settler, RN Outcome: Not Progressing 06/21/2023 0934 by Sharion Settler, RN Outcome: Not Progressing   Problem: Metabolic: Goal: Ability to maintain appropriate glucose levels will improve 06/29/2023 0935 by Sharion Settler, RN Outcome: Not Progressing 07/02/2023 0934 by Sharion Settler, RN Outcome: Not Progressing   Problem: Nutritional: Goal: Maintenance of adequate nutrition will improve 06/19/2023 0935 by Sharion Settler, RN Outcome: Not Progressing 06/03/2023 0934 by Sharion Settler, RN Outcome: Not Progressing Goal: Progress toward achieving an optimal weight will improve 06/07/2023 0935 by Sharion Settler, RN Outcome: Not Progressing 06/25/2023 0934 by Sharion Settler, RN Outcome: Not Progressing   Problem: Skin Integrity: Goal: Risk for impaired skin integrity will decrease 06/30/2023 0935 by Sharion Settler, RN Outcome: Not Progressing 06/03/2023 0934 by Sharion Settler, RN Outcome: Not Progressing    Problem: Tissue Perfusion: Goal: Adequacy of tissue perfusion will improve 06/09/2023 0935 by Sharion Settler, RN Outcome: Not Progressing 06/25/2023 0934 by Sharion Settler, RN Outcome: Not Progressing   Problem: Education: Goal: Knowledge of General Education information will improve Description: Including pain rating scale, medication(s)/side effects and non-pharmacologic comfort measures 06/27/2023 0935 by Sharion Settler, RN Outcome: Not Progressing 06/04/2023 0934 by Sharion Settler, RN Outcome: Not Progressing   Problem: Health Behavior/Discharge Planning: Goal: Ability to manage health-related needs will improve 06/29/2023 0935 by Sharion Settler, RN Outcome: Not Progressing 07/01/2023 0934 by Sharion Settler, RN Outcome: Not Progressing   Problem: Clinical Measurements: Goal: Ability to maintain clinical measurements within normal limits will improve 06/22/2023 0935 by Sharion Settler, RN Outcome: Not Progressing 06/02/2023 0934 by Sharion Settler, RN Outcome: Not Progressing Goal: Will remain free from infection 06/13/2023 0935 by Sharion Settler, RN Outcome: Not Progressing 06/15/2023 0934 by Sharion Settler, RN Outcome: Not Progressing Goal: Diagnostic test results will improve 06/26/2023 0935 by Sharion Settler, RN Outcome: Not Progressing 06/15/2023 0934 by Sharion Settler, RN Outcome: Not Progressing Goal: Respiratory complications will improve 06/17/2023 0935 by Sharion Settler, RN Outcome: Not Progressing 06/30/2023 0934 by Sharion Settler, RN Outcome: Not Progressing Goal: Cardiovascular complication will be avoided 06/06/2023 0935 by Sharion Settler, RN Outcome: Not Progressing 06/07/2023 0934 by Sharion Settler, RN Outcome: Not Progressing   Problem: Activity: Goal: Risk for activity intolerance will decrease 07/02/2023 0935 by Sharion Settler, RN Outcome: Not  Progressing 07/02/2023 0934 by Sharion Settler, RN Outcome: Not Progressing   Problem: Nutrition: Goal: Adequate nutrition will be maintained 06/03/2023 0935 by Sharion Settler, RN Outcome: Not Progressing 06/06/2023 0934 by Sharion Settler, RN Outcome: Not Progressing   Problem: Coping: Goal: Level of anxiety will decrease 06/16/2023 0935 by Sharion Settler, RN Outcome: Not Progressing 06/09/2023 0934 by Sharion Settler, RN Outcome: Not Progressing   Problem: Elimination: Goal: Will not experience complications related to bowel motility 07/02/2023 0935 by Sharion Settler, RN Outcome: Not Progressing 06/13/2023 0934 by  Sharion Settler, RN Outcome: Not Progressing Goal: Will not experience complications related to urinary retention 06/12/2023 0935 by Sharion Settler, RN Outcome: Not Progressing 06/03/2023 0934 by Sharion Settler, RN Outcome: Not Progressing   Problem: Pain Management: Goal: General experience of comfort will improve 06/24/2023 0935 by Sharion Settler, RN Outcome: Not Progressing 06/10/2023 0934 by Sharion Settler, RN Outcome: Not Progressing   Problem: Safety: Goal: Ability to remain free from injury will improve 06/27/2023 0935 by Sharion Settler, RN Outcome: Not Progressing 06/04/2023 0934 by Sharion Settler, RN Outcome: Not Progressing   Problem: Skin Integrity: Goal: Risk for impaired skin integrity will decrease 06/13/2023 0935 by Sharion Settler, RN Outcome: Not Progressing 06/06/2023 0934 by Sharion Settler, RN Outcome: Not Progressing

## 2023-07-03 DEATH — deceased

## 2023-07-09 ENCOUNTER — Other Ambulatory Visit: Payer: Medicare HMO

## 2023-07-09 ENCOUNTER — Other Ambulatory Visit: Payer: Self-pay

## 2023-07-09 ENCOUNTER — Ambulatory Visit: Payer: Medicare HMO | Admitting: Oncology

## 2023-08-22 ENCOUNTER — Ambulatory Visit: Payer: Medicare HMO | Admitting: Urology

## 2024-04-15 ENCOUNTER — Ambulatory Visit: Payer: Medicare HMO | Admitting: Urology
